# Patient Record
Sex: Male | Born: 1950 | Race: Black or African American | Hispanic: No | Smoking: Former smoker
Health system: Southern US, Community
[De-identification: ages and names within clinical notes are randomized; demographics above are authoritative.]

## PROBLEM LIST (undated history)

## (undated) DIAGNOSIS — E785 Hyperlipidemia, unspecified: Secondary | ICD-10-CM

## (undated) DIAGNOSIS — K5900 Constipation, unspecified: Secondary | ICD-10-CM

## (undated) DIAGNOSIS — I5042 Chronic combined systolic (congestive) and diastolic (congestive) heart failure: Secondary | ICD-10-CM

## (undated) DIAGNOSIS — J309 Allergic rhinitis, unspecified: Secondary | ICD-10-CM

## (undated) DIAGNOSIS — G8929 Other chronic pain: Secondary | ICD-10-CM

## (undated) DIAGNOSIS — F528 Other sexual dysfunction not due to a substance or known physiological condition: Secondary | ICD-10-CM

## (undated) DIAGNOSIS — I2510 Atherosclerotic heart disease of native coronary artery without angina pectoris: Secondary | ICD-10-CM

## (undated) DIAGNOSIS — J302 Other seasonal allergic rhinitis: Secondary | ICD-10-CM

## (undated) DIAGNOSIS — G5760 Lesion of plantar nerve, unspecified lower limb: Secondary | ICD-10-CM

## (undated) DIAGNOSIS — N189 Chronic kidney disease, unspecified: Secondary | ICD-10-CM

## (undated) DIAGNOSIS — E118 Type 2 diabetes mellitus with unspecified complications: Secondary | ICD-10-CM

## (undated) DIAGNOSIS — K219 Gastro-esophageal reflux disease without esophagitis: Secondary | ICD-10-CM

## (undated) DIAGNOSIS — G629 Polyneuropathy, unspecified: Secondary | ICD-10-CM

## (undated) DIAGNOSIS — I639 Cerebral infarction, unspecified: Secondary | ICD-10-CM

## (undated) DIAGNOSIS — M25519 Pain in unspecified shoulder: Secondary | ICD-10-CM

## (undated) DIAGNOSIS — I447 Left bundle-branch block, unspecified: Secondary | ICD-10-CM

## (undated) DIAGNOSIS — M549 Dorsalgia, unspecified: Secondary | ICD-10-CM

## (undated) DIAGNOSIS — E1165 Type 2 diabetes mellitus with hyperglycemia: Secondary | ICD-10-CM

## (undated) DIAGNOSIS — I10 Essential (primary) hypertension: Secondary | ICD-10-CM

## (undated) DIAGNOSIS — IMO0002 Reserved for concepts with insufficient information to code with codable children: Secondary | ICD-10-CM

## (undated) DIAGNOSIS — G459 Transient cerebral ischemic attack, unspecified: Secondary | ICD-10-CM

## (undated) HISTORY — DX: Allergic rhinitis, unspecified: J30.9

## (undated) HISTORY — DX: Lesion of plantar nerve, unspecified lower limb: G57.60

## (undated) HISTORY — DX: Chronic combined systolic (congestive) and diastolic (congestive) heart failure: I50.42

## (undated) HISTORY — DX: Chronic kidney disease, unspecified: N18.9

## (undated) HISTORY — PX: OTHER SURGICAL HISTORY: SHX169

## (undated) HISTORY — DX: Essential (primary) hypertension: I10

## (undated) HISTORY — DX: Reserved for concepts with insufficient information to code with codable children: IMO0002

## (undated) HISTORY — DX: Other sexual dysfunction not due to a substance or known physiological condition: F52.8

## (undated) HISTORY — DX: Pain in unspecified shoulder: M25.519

## (undated) HISTORY — DX: Atherosclerotic heart disease of native coronary artery without angina pectoris: I25.10

## (undated) HISTORY — DX: Cerebral infarction, unspecified: I63.9

## (undated) HISTORY — DX: Gastro-esophageal reflux disease without esophagitis: K21.9

## (undated) HISTORY — PX: DENTAL SURGERY: SHX609

## (undated) HISTORY — DX: Type 2 diabetes mellitus with unspecified complications: E11.8

## (undated) HISTORY — DX: Type 2 diabetes mellitus with hyperglycemia: E11.65

## (undated) HISTORY — DX: Hyperlipidemia, unspecified: E78.5

## (undated) HISTORY — PX: ANGIOPLASTY: SHX39

## (undated) HISTORY — PX: COLONOSCOPY: SHX174

## (undated) HISTORY — DX: Left bundle-branch block, unspecified: I44.7

---

## 2002-06-04 ENCOUNTER — Encounter: Payer: Self-pay | Admitting: *Deleted

## 2002-06-04 ENCOUNTER — Emergency Department (HOSPITAL_COMMUNITY): Admission: EM | Admit: 2002-06-04 | Discharge: 2002-06-04 | Payer: Self-pay | Admitting: Emergency Medicine

## 2003-11-23 HISTORY — PX: CORONARY ANGIOPLASTY: SHX604

## 2004-02-12 ENCOUNTER — Inpatient Hospital Stay (HOSPITAL_COMMUNITY): Admission: EM | Admit: 2004-02-12 | Discharge: 2004-02-14 | Payer: Self-pay | Admitting: Emergency Medicine

## 2004-09-10 ENCOUNTER — Ambulatory Visit: Payer: Self-pay | Admitting: Internal Medicine

## 2004-09-10 ENCOUNTER — Encounter: Payer: Self-pay | Admitting: Internal Medicine

## 2005-11-22 LAB — HM COLONOSCOPY

## 2006-09-06 ENCOUNTER — Ambulatory Visit: Payer: Self-pay | Admitting: Cardiology

## 2007-09-12 ENCOUNTER — Ambulatory Visit: Payer: Self-pay | Admitting: Internal Medicine

## 2007-09-21 ENCOUNTER — Ambulatory Visit: Payer: Self-pay | Admitting: Internal Medicine

## 2007-09-21 LAB — CONVERTED CEMR LAB
BUN: 8 mg/dL (ref 6–23)
CO2: 31 meq/L (ref 19–32)
Calcium: 9.2 mg/dL (ref 8.4–10.5)
Chloride: 105 meq/L (ref 96–112)
Cholesterol: 164 mg/dL (ref 0–200)
Creatinine, Ser: 0.9 mg/dL (ref 0.4–1.5)
GFR calc Af Amer: 112 mL/min
GFR calc non Af Amer: 92 mL/min
Glucose, Bld: 157 mg/dL — ABNORMAL HIGH (ref 70–99)
HDL: 40.8 mg/dL (ref >39.0)
Hgb A1c MFr Bld: 7.7 % — ABNORMAL HIGH (ref 4.6–6.0)
LDL Cholesterol: 87 mg/dL (ref 0–99)
Potassium: 4.1 meq/L (ref 3.5–5.1)
Pro B Natriuretic peptide (BNP): 17 pg/mL (ref 0.0–100.0)
Sodium: 142 meq/L (ref 135–145)
Total CHOL/HDL Ratio: 4
Triglycerides: 179 mg/dL — ABNORMAL HIGH (ref 0–149)
VLDL: 36 mg/dL (ref 0–40)

## 2008-11-08 ENCOUNTER — Inpatient Hospital Stay (HOSPITAL_COMMUNITY): Admission: EM | Admit: 2008-11-08 | Discharge: 2008-11-09 | Payer: Self-pay | Admitting: Emergency Medicine

## 2008-11-08 ENCOUNTER — Encounter (INDEPENDENT_AMBULATORY_CARE_PROVIDER_SITE_OTHER): Payer: Self-pay | Admitting: Internal Medicine

## 2008-12-26 ENCOUNTER — Encounter: Payer: Self-pay | Admitting: Internal Medicine

## 2008-12-26 LAB — CONVERTED CEMR LAB
ALT: 22 units/L
AST: 24 units/L
Albumin: 4.1 g/dL
Alkaline Phosphatase: 75 units/L
BUN: 13 mg/dL
CO2: 31 meq/L
Calcium: 9.8 mg/dL
Chloride: 99 meq/L
Cholesterol: 203 mg/dL
Creatinine, Ser: 1.1 mg/dL
Glucose, Bld: 163 mg/dL
HDL: 62 mg/dL
Hemoglobin: 17.1 g/dL
Hgb A1c MFr Bld: 9.4 %
LDL Cholesterol: 104 mg/dL
PSA: 0.13 ng/mL
Potassium: 4.3 meq/L
Sodium: 139 meq/L
Total Bilirubin: 1.5 mg/dL
Total Protein: 7.6 g/dL
Triglyceride fasting, serum: 108 mg/dL

## 2009-02-03 ENCOUNTER — Encounter: Payer: Self-pay | Admitting: Internal Medicine

## 2009-02-03 LAB — CONVERTED CEMR LAB
ALT: 22 units/L
AST: 21 units/L
Albumin: 4.2 g/dL
Alkaline Phosphatase: 68 units/L
BUN: 10 mg/dL
CO2: 31 meq/L
Calcium: 9.7 mg/dL
Chloride: 102 meq/L
Creatinine, Ser: 1.1 mg/dL
Glucose, Bld: 122 mg/dL
Potassium: 4.5 meq/L
Sodium: 138 meq/L
Total Bilirubin: 1.4 mg/dL
Total CK: 99 units/L
Total Protein: 7.6 g/dL

## 2009-03-07 DIAGNOSIS — E785 Hyperlipidemia, unspecified: Secondary | ICD-10-CM | POA: Insufficient documentation

## 2009-03-07 DIAGNOSIS — E1169 Type 2 diabetes mellitus with other specified complication: Secondary | ICD-10-CM | POA: Insufficient documentation

## 2009-03-12 ENCOUNTER — Ambulatory Visit: Payer: Self-pay | Admitting: Internal Medicine

## 2009-03-12 ENCOUNTER — Encounter: Payer: Self-pay | Admitting: Internal Medicine

## 2009-03-12 DIAGNOSIS — I10 Essential (primary) hypertension: Secondary | ICD-10-CM | POA: Insufficient documentation

## 2009-03-12 DIAGNOSIS — I152 Hypertension secondary to endocrine disorders: Secondary | ICD-10-CM | POA: Insufficient documentation

## 2009-03-12 DIAGNOSIS — F528 Other sexual dysfunction not due to a substance or known physiological condition: Secondary | ICD-10-CM | POA: Insufficient documentation

## 2009-03-12 DIAGNOSIS — I2510 Atherosclerotic heart disease of native coronary artery without angina pectoris: Secondary | ICD-10-CM | POA: Insufficient documentation

## 2009-03-12 DIAGNOSIS — Z9861 Coronary angioplasty status: Secondary | ICD-10-CM

## 2009-03-17 ENCOUNTER — Telehealth (INDEPENDENT_AMBULATORY_CARE_PROVIDER_SITE_OTHER): Payer: Self-pay | Admitting: Radiology

## 2009-03-26 ENCOUNTER — Encounter: Payer: Self-pay | Admitting: Internal Medicine

## 2009-03-31 ENCOUNTER — Telehealth (INDEPENDENT_AMBULATORY_CARE_PROVIDER_SITE_OTHER): Payer: Self-pay | Admitting: Radiology

## 2009-04-01 ENCOUNTER — Ambulatory Visit: Payer: Self-pay

## 2009-04-01 ENCOUNTER — Encounter: Payer: Self-pay | Admitting: Internal Medicine

## 2009-04-20 ENCOUNTER — Encounter: Payer: Self-pay | Admitting: Internal Medicine

## 2009-04-20 LAB — CONVERTED CEMR LAB
ALT: 20 units/L
AST: 22 units/L
Alkaline Phosphatase: 73 units/L
BUN: 15 mg/dL
CO2: 31 meq/L
Calcium: 9.6 mg/dL
Chloride: 99 meq/L
Cholesterol: 172 mg/dL
Creatinine, Ser: 1.3 mg/dL
Glucose, Bld: 202 mg/dL
HDL: 54 mg/dL
Hgb A1c MFr Bld: 8.3 %
LDL Cholesterol: 67 mg/dL
Potassium: 4.1 meq/L
Sodium: 137 meq/L
Triglyceride fasting, serum: 200 mg/dL

## 2010-02-24 ENCOUNTER — Ambulatory Visit: Payer: Self-pay | Admitting: Internal Medicine

## 2010-02-25 ENCOUNTER — Ambulatory Visit: Payer: Self-pay | Admitting: Internal Medicine

## 2010-02-26 DIAGNOSIS — IMO0002 Reserved for concepts with insufficient information to code with codable children: Secondary | ICD-10-CM | POA: Insufficient documentation

## 2010-02-26 DIAGNOSIS — E118 Type 2 diabetes mellitus with unspecified complications: Secondary | ICD-10-CM

## 2010-02-26 DIAGNOSIS — E1165 Type 2 diabetes mellitus with hyperglycemia: Secondary | ICD-10-CM | POA: Insufficient documentation

## 2010-03-02 LAB — CONVERTED CEMR LAB
ALT: 29 units/L (ref 0–53)
AST: 23 units/L (ref 0–37)
Albumin: 4.1 g/dL (ref 3.5–5.2)
Alkaline Phosphatase: 85 units/L (ref 39–117)
BUN: 12 mg/dL (ref 6–23)
Basophils Absolute: 0 10*3/uL (ref 0.0–0.1)
Basophils Relative: 0.4 % (ref 0.0–3.0)
Bilirubin, Direct: 0.1 mg/dL (ref 0.0–0.3)
CO2: 32 meq/L (ref 19–32)
Calcium: 9.3 mg/dL (ref 8.4–10.5)
Chloride: 98 meq/L (ref 96–112)
Cholesterol: 224 mg/dL — ABNORMAL HIGH (ref 0–200)
Creatinine, Ser: 1.2 mg/dL (ref 0.4–1.5)
Direct LDL: 112.2 mg/dL
Eosinophils Absolute: 0.2 10*3/uL (ref 0.0–0.7)
Eosinophils Relative: 2.9 % (ref 0.0–5.0)
GFR calc non Af Amer: 79.35 mL/min (ref >60)
Glucose, Bld: 365 mg/dL — ABNORMAL HIGH (ref 70–99)
HCT: 45.6 % (ref 39.0–52.0)
HDL: 63.7 mg/dL (ref >39.00)
Hemoglobin: 15.6 g/dL (ref 13.0–17.0)
Hgb A1c MFr Bld: 12.6 % — ABNORMAL HIGH (ref 4.6–6.5)
Lymphocytes Relative: 39.1 % (ref 12.0–46.0)
Lymphs Abs: 2.4 10*3/uL (ref 0.7–4.0)
MCHC: 34.2 g/dL (ref 30.0–36.0)
MCV: 90.7 fL (ref 78.0–100.0)
Monocytes Absolute: 0.6 10*3/uL (ref 0.1–1.0)
Monocytes Relative: 8.8 % (ref 3.0–12.0)
Neutro Abs: 3.1 10*3/uL (ref 1.4–7.7)
Neutrophils Relative %: 48.8 % (ref 43.0–77.0)
Platelets: 199 10*3/uL (ref 150.0–400.0)
Potassium: 3.8 meq/L (ref 3.5–5.1)
RBC: 5.03 M/uL (ref 4.22–5.81)
RDW: 13.1 % (ref 11.5–14.6)
Sodium: 138 meq/L (ref 135–145)
Total Bilirubin: 1.1 mg/dL (ref 0.3–1.2)
Total CHOL/HDL Ratio: 4
Total Protein: 8.3 g/dL (ref 6.0–8.3)
Triglycerides: 230 mg/dL — ABNORMAL HIGH (ref 0.0–149.0)
VLDL: 46 mg/dL — ABNORMAL HIGH (ref 0.0–40.0)
WBC: 6.3 10*3/uL (ref 4.5–10.5)

## 2010-04-13 ENCOUNTER — Ambulatory Visit: Payer: Self-pay | Admitting: Internal Medicine

## 2010-04-13 DIAGNOSIS — Z87891 Personal history of nicotine dependence: Secondary | ICD-10-CM | POA: Insufficient documentation

## 2010-04-13 DIAGNOSIS — E1159 Type 2 diabetes mellitus with other circulatory complications: Secondary | ICD-10-CM | POA: Insufficient documentation

## 2010-04-13 DIAGNOSIS — R519 Headache, unspecified: Secondary | ICD-10-CM | POA: Insufficient documentation

## 2010-04-13 DIAGNOSIS — E119 Type 2 diabetes mellitus without complications: Secondary | ICD-10-CM | POA: Insufficient documentation

## 2010-04-13 DIAGNOSIS — J309 Allergic rhinitis, unspecified: Secondary | ICD-10-CM | POA: Insufficient documentation

## 2010-04-13 DIAGNOSIS — Z794 Long term (current) use of insulin: Secondary | ICD-10-CM

## 2010-04-13 DIAGNOSIS — E1169 Type 2 diabetes mellitus with other specified complication: Secondary | ICD-10-CM | POA: Insufficient documentation

## 2010-04-13 DIAGNOSIS — R51 Headache: Secondary | ICD-10-CM | POA: Insufficient documentation

## 2010-04-13 DIAGNOSIS — K219 Gastro-esophageal reflux disease without esophagitis: Secondary | ICD-10-CM | POA: Insufficient documentation

## 2010-04-13 LAB — CONVERTED CEMR LAB: PSA: 0.16 ng/mL (ref 0.10–4.00)

## 2010-04-13 LAB — HM DIABETES FOOT EXAM

## 2010-04-16 ENCOUNTER — Telehealth (INDEPENDENT_AMBULATORY_CARE_PROVIDER_SITE_OTHER): Payer: Self-pay | Admitting: *Deleted

## 2010-04-21 ENCOUNTER — Encounter: Payer: Self-pay | Admitting: Internal Medicine

## 2010-04-29 ENCOUNTER — Encounter: Payer: Self-pay | Admitting: Internal Medicine

## 2010-05-05 ENCOUNTER — Encounter: Payer: Self-pay | Admitting: Internal Medicine

## 2010-05-15 ENCOUNTER — Ambulatory Visit: Payer: Self-pay | Admitting: Endocrinology

## 2010-05-15 LAB — CONVERTED CEMR LAB: Hgb A1c MFr Bld: 9.8 % — ABNORMAL HIGH (ref 4.6–6.5)

## 2010-05-29 ENCOUNTER — Ambulatory Visit: Payer: Self-pay | Admitting: Internal Medicine

## 2010-05-29 DIAGNOSIS — G5760 Lesion of plantar nerve, unspecified lower limb: Secondary | ICD-10-CM | POA: Insufficient documentation

## 2010-05-29 DIAGNOSIS — H6690 Otitis media, unspecified, unspecified ear: Secondary | ICD-10-CM | POA: Insufficient documentation

## 2010-05-29 LAB — CONVERTED CEMR LAB: Blood Glucose, Fingerstick: 105

## 2010-06-02 ENCOUNTER — Encounter: Payer: Self-pay | Admitting: Internal Medicine

## 2010-10-26 ENCOUNTER — Ambulatory Visit: Payer: Self-pay | Admitting: Internal Medicine

## 2010-10-28 LAB — CONVERTED CEMR LAB: Hgb A1c MFr Bld: 13.6 % — ABNORMAL HIGH (ref 4.6–6.5)

## 2010-10-29 ENCOUNTER — Ambulatory Visit: Payer: Self-pay | Admitting: Endocrinology

## 2010-11-05 ENCOUNTER — Encounter: Payer: Self-pay | Admitting: Endocrinology

## 2010-12-05 ENCOUNTER — Encounter: Payer: Self-pay | Admitting: Endocrinology

## 2010-12-22 NOTE — Assessment & Plan Note (Signed)
Summary: cough/coughing up phlegm/cd   Vital Signs:  Patient profile:   60 year old male Height:      71 inches (180.34 cm) Weight:      204.6 pounds (93.00 kg) O2 Sat:      97 % on Room air Temp:     98.8 degrees F (37.11 degrees C) oral Pulse rate:   90 / minute BP sitting:   142 / 98  (left arm) Cuff size:   large  Vitals Entered By: Tomma Lightning RMA (October 26, 2010 1:22 PM)  O2 Flow:  Room air CC: Cough & (L) side pain ? due to cough, URI symptoms Is Patient Diabetic? Yes Did you bring your meter with you today? No Pain Assessment Patient in pain? yes     Location: (L) side Type: aching   Primary Care Provider:  Rowe Clack MD  CC:  Cough & (L) side pain ? due to cough and URI symptoms.  History of Present Illness:  URI Symptoms      This is a 60 year old man who presents with URI symptoms.  The symptoms began 2 weeks ago.  The severity is described as moderate.  not using any OTC meds for same.  The patient reports nasal congestion, productive cough, and sick contacts, but denies clear nasal discharge, purulent nasal discharge, and sore throat.  Associated symptoms include low-grade fever (<100.5 degrees).  The patient denies dyspnea, wheezing, vomiting, and diarrhea.  The patient also reports itchy throat, headache, and muscle aches.  The patient denies sneezing and severe fatigue.  The patient denies the following risk factors for Strep sinusitis: tender adenopathy  reviewed chronic med issues: 1) DM2, improving control -reports checking sugars at home 3-4x/d - runs 100s-120s now - no hypoglycemic signs or symptoms - reports compliance with ongoing medical treatment and no changes in medication dose or frequency. denies adverse side effects related to current therapy.  never on insulin - no hx [facility] hosp reproted  2) HTN -reports compliance with ongoing medical treatment and no changes in medication dose or frequency. denies adverse side effects related to  current therapy.  no vision changes or HA, no unilateral weakness -  3) CAD - follws with [facility] cards for same - reports compliance with ongoing medical treatment and no changes in medication dose or frequency. denies adverse side effects related to current therapy.  no CP or DOE or near syncope- no LE swelling  4) right shoulder pain - involves neck on right side too - chronic symptoms since on-job injury in 1999 - no tingling - no weakness, no HA or balance problems - symptoms not improved with tylenol or heat - some better with muscle relaxants - has seen ortho for same - rec surg but wishes to delay for now  Clinical Review Panels:  Lipid Management   Cholesterol:  224 (02/25/2010)   LDL (bad choesterol):  67 (04/20/2009)   HDL (good cholesterol):  63.70 (02/25/2010)   Triglycerides:  200 (04/20/2009)  Diabetes Management   HgBA1C:  9.8 (05/15/2010)   Creatinine:  1.2 (02/25/2010)   Last Foot Exam:  yes (04/13/2010)  CBC   WBC:  6.3 (02/25/2010)   RBC:  5.03 (02/25/2010)   Hgb:  15.6 (02/25/2010)   Hct:  45.6 (02/25/2010)   Platelets:  199.0 (02/25/2010)   MCV  90.7 (02/25/2010)   MCHC  34.2 (02/25/2010)   RDW  13.1 (02/25/2010)   PMN:  48.8 (02/25/2010)   Lymphs:  39.1 (02/25/2010)   Monos:  8.8 (02/25/2010)   Eosinophils:  2.9 (02/25/2010)   Basophil:  0.4 (02/25/2010)  Complete Metabolic Panel   Glucose:  365 (02/25/2010)   Sodium:  138 (02/25/2010)   Potassium:  3.8 (02/25/2010)   Chloride:  98 (02/25/2010)   CO2:  32 (02/25/2010)   BUN:  12 (02/25/2010)   Creatinine:  1.2 (02/25/2010)   Albumin:  4.1 (02/25/2010)   Total Protein:  8.3 (02/25/2010)   Calcium:  9.3 (02/25/2010)   Total Bili:  1.1 (02/25/2010)   Alk Phos:  85 (02/25/2010)   SGPT (ALT):  29 (02/25/2010)   SGOT (AST):  23 (02/25/2010)   Current Medications (verified): 1)  Aspirin 325 Mg Tabs (Aspirin) .... Take 1 Tab By Mouth Every Day 2)  Carvedilol 6.25 Mg Tabs (Carvedilol) .... Take One  Tablet By Mouth Twice A Day 3)  Lisinopril-Hydrochlorothiazide 20-12.5 Mg Tabs (Lisinopril-Hydrochlorothiazide) .... Take 1 Tablet By Mouth Once A Day 4)  Pravastatin Sodium 20 Mg Tabs (Pravastatin Sodium) .... Take One Tablet By Mouth Daily At Bedtime 5)  Nitrostat 0.4 Mg Subl (Nitroglycerin) .Marland Kitchen.. 1 Tablet Under Tongue At Onset of Chest Pain; You May Repeat Every 5 Minutes For Up To 3 Doses. 6)  Soma 350 Mg Tabs (Carisoprodol) .... Take 1 Three Times A Day As Needed For Muscle Spasms 7)  Claritin 10 Mg Tabs (Loratadine) .... Take 1 By Mouth Once Daily As Needed 8)  Glipizide Xl 5 Mg Xr24h-Tab (Glipizide) .Marland Kitchen.. 1 By Mouth Two Times A Day 9)  Metformin Hcl 500 Mg Xr24h-Tab (Metformin Hcl) .... 2 Tabs Two Times A Day 10)  Januvia 100 Mg Tabs (Sitagliptin Phosphate) .Marland Kitchen.. 1 Tab Each Am 11)  Bromocriptine Mesylate 2.5 Mg Tabs (Bromocriptine Mesylate) .... 1/2 Tab At Bedtime  Allergies (verified): 1)  ! Penicillin 2)  ! * Shellfish  Past History:  Past Medical History: 1. CAD      a. s/p BMS to OM1 2001      b. repeat cath 2005: ISR of OM1 stent treated with DES 2. h/o LV dysfunction      a. previous EF 35-45%. EF on cath 1/05: 60%      b. ECHO 12/09: EF 60% 3. DM2, uncontrolled 4. HTN 5. Hyperlipidemia Allergic rhinitis GERD  erectile dysfunction  MD roster: cards - [facility] - murphywainer  Review of Systems  The patient denies weight loss, syncope, hemoptysis, and abdominal pain.    Physical Exam  General:  alert, well-developed, well-nourished, and cooperative to examination.   mildly ill Head:  Normocephalic and atraumatic without obvious abnormalities. No apparent alopecia or balding. Eyes:  vision grossly intact; pupils equal, round and reactive to light.  conjunctiva and lids normal.    Ears:  R TM hazy with erythema - +mod effusion Mouth:  teeth and gums in good repair; mucous membranes moist, without lesions or ulcers. oropharynx clear without  exudate, mod erythema. +PND Lungs:  normal respiratory effort, no intercostal retractions or use of accessory muscles; normal breath sounds bilaterally - no crackles and no wheezes.    Heart:  normal rate, regular rhythm, no murmur, and no rub. BLE without edema.    Impression & Recommendations:  Problem # 1:  ACUTE SINUSITIS, UNSPECIFIED (ICD-461.9)  His updated medication list for this problem includes:    Augmentin 875-125 Mg Tabs (Amoxicillin-pot clavulanate) .Marland Kitchen... 1 by mouth two times a day x 7 days    Tessalon Perles 100 Mg Caps (Benzonatate) .Marland KitchenMarland KitchenMarland KitchenMarland Kitchen  1 by mouth tids x 5 days, then as needed for cough symptoms  Instructed on treatment. Call if symptoms persist or worsen.   Orders: Prescription Created Electronically [phone])  Problem # 2:  DIABETES MELLITUS, TYPE II, UNCONTROLLED (ICD-250.02)  His updated medication list for this problem includes:    Aspirin 325 Mg Tabs (Aspirin) .Marland Kitchen... Take 1 tab by mouth every day    Lisinopril-hydrochlorothiazide 20-12.5 Mg Tabs (Lisinopril-hydrochlorothiazide) .Marland Kitchen... Take 1 tablet by mouth once a day    Glipizide Xl 5 Mg Xr24h-tab (Glipizide) .Marland Kitchen... 1 by mouth two times a day    Metformin Hcl 500 Mg Xr24h-tab (Metformin hcl) .Marland Kitchen... 2 tabs two times a day    Januvia 100 Mg Tabs (Sitagliptin phosphate) .Marland Kitchen... 1 tab each am  cont endo mgmt as ongoing  Orders: TLB-A1C / Hgb A1C (Glycohemoglobin) (83036-A1C)  Labs Reviewed: Creat: 1.2 (02/25/2010)    Reviewed HgBA1c results: 9.8 (05/15/2010)  12.6 (02/25/2010)  Complete Medication List: 1)  Aspirin 325 Mg Tabs (Aspirin) .... Take 1 tab by mouth every day 2)  Carvedilol 6.25 Mg Tabs (Carvedilol) .... Take one tablet by mouth twice a day 3)  Lisinopril-hydrochlorothiazide 20-12.5 Mg Tabs (Lisinopril-hydrochlorothiazide) .... Take 1 tablet by mouth once a day 4)  Pravastatin Sodium 20 Mg Tabs (Pravastatin sodium) .... Take one tablet by mouth daily at bedtime 5)  Nitrostat 0.4 Mg Subl  (Nitroglycerin) .Marland Kitchen.. 1 tablet under tongue at onset of chest pain; you may repeat every 5 minutes for up to 3 doses. 6)  Soma 350 Mg Tabs (Carisoprodol) .... Take 1 three times a day as needed for muscle spasms 7)  Claritin 10 Mg Tabs (Loratadine) .... Take 1 by mouth once daily as needed 8)  Glipizide Xl 5 Mg Xr24h-tab (Glipizide) .Marland Kitchen.. 1 by mouth two times a day 9)  Metformin Hcl 500 Mg Xr24h-tab (Metformin hcl) .... 2 tabs two times a day 10)  Januvia 100 Mg Tabs (Sitagliptin phosphate) .Marland Kitchen.. 1 tab each am 11)  Bromocriptine Mesylate 2.5 Mg Tabs (Bromocriptine mesylate) .... 1/2 tab at bedtime 12)  Augmentin 875-125 Mg Tabs (Amoxicillin-pot clavulanate) .Marland Kitchen.. 1 by mouth two times a day x 7 days 13)  Tessalon Perles 100 Mg Caps (Benzonatate) .Marland Kitchen.. 1 by mouth tids x 5 days, then as needed for cough symptoms  Patient Instructions: 1)  it was good to see you today. 2)  Augmentin and tessalon for sinus and cough symptoms - your prescriptions have been electronically submitted to your pharmacy. Please take as directed. Contact our office if you believe you're having problems with the medication(s). 3)  test(s) ordered today - your results will be posted on the phone tree for review in 48-72 hours from the time of test completion; call [phone] and enter your 9 digit MRN (listed above on this page, just below your name); if any changes need to be made or there are abnormal results, you will be contacted directly.  4)  refills on all medications today 5)  Please schedule a follow-up appointment in 3-6 months to follow diabetes, call sooner if problems.  Prescriptions: JANUVIA 100 MG TABS (SITAGLIPTIN PHOSPHATE) 1 tab each am  #90 x 3   Entered and Authorized by:   Rowe Clack MD   Signed by:   Rowe Clack MD on 10/26/2010   Method used:   Electronically to        [facility] (retail)       [address]  [place], [place]  [address]       Ph:  [phone]       Fax: [phone]   RxID:   [id number] METFORMIN HCL 500 MG XR24H-TAB (METFORMIN HCL) 2 tabs two times a day  #360 x 3   Entered and Authorized by:   Rowe Clack MD   Signed by:   Rowe Clack MD on 10/26/2010   Method used:   Electronically to        [facility] (retail)       [address]       [place], [place]  [address]       Ph: [phone]       Fax: [phone]   RxID:   [id number] GLIPIZIDE XL 5 MG XR24H-TAB (GLIPIZIDE) 1 by mouth two times a day  #180 x 3   Entered and Authorized by:   Rowe Clack MD   Signed by:   Rowe Clack MD on 10/26/2010   Method used:   Electronically to        [facility] (retail)       [address]       [place], [place]  [address]       Ph: [phone]       Fax: [phone]   RxID:   [id number] PRAVASTATIN SODIUM 20 MG TABS (PRAVASTATIN SODIUM) Take one tablet by mouth daily at bedtime  #90 x 3   Entered and Authorized by:   Rowe Clack MD   Signed by:   Rowe Clack MD on 10/26/2010   Method used:   Electronically to        [facility] (retail)       [address]       [place], [place]  [address]       Ph: [phone]       Fax: [phone]   RxID:   [id number] TESSALON PERLES 100 MG CAPS (BENZONATATE) 1 by mouth tids x 5 days, then as needed for cough symptoms  #30 x 0   Entered and Authorized by:   Rowe Clack MD   Signed by:   Rowe Clack MD on 10/26/2010   Method used:   Electronically to        [facility] (retail)       [address]       [place], [place]  [address]       Ph: [phone]       Fax: [phone]   RxID:   [id number] AUGMENTIN 875-125 MG TABS (AMOXICILLIN-POT CLAVULANATE) 1 by mouth two times a day x 7 days  #14 x 0   Entered and Authorized by:   Rowe Clack MD   Signed by:   Rowe Clack MD on 10/26/2010   Method used:   Electronically to        [facility] (retail)       [address]       [place],   [address]       Ph: [phone]       Fax: [phone]   RxID:  [id number]    Orders Added: 1)  TLB-A1C / Hgb A1C (Glycohemoglobin) [83036-A1C] 2)  Est. Patient Level IV [id number] 3)  Prescription Created Electronically [phone]

## 2010-12-22 NOTE — Assessment & Plan Note (Signed)
Summary: NEW / MEDICARE/ REFERRED BY DR BENSIMONE/[facility]   Vital Signs:  Patient profile:   60 year old male Height:      71 inches (180.34 cm) Weight:      201.8 pounds (91.73 kg) O2 Sat:      98 % on Room air Temp:     98.7 degrees F (37.06 degrees C) oral Pulse rate:   94 / minute BP sitting:   120 / 92  (left arm) Cuff size:   large  Vitals Entered By: Tomma Lightning (Apr 13, 2010 9:38 AM)  O2 Flow:  Room air CC: New patient Is Patient Diabetic? Yes Did you bring your meter with you today? No Pain Assessment Patient in pain? no       Vision Screening:Left eye with correction: 20 / 30 Right eye with correction: 20 / 30 Both eyes with correction: 20 / 30  Color vision testing: normal       Primary Care Provider:  Rowe Clack MD  CC:  New patient.  History of Present Illness: new pt to me and our dvision - here to est care - follows with our cards group - prev PCP in [place]  patient is here today for annual medicare wellness physical. Patient feels well today - wants to "be checked for cancer" -prostate and colon specifically due to [place] same- Diet: Heart Healthy or DM  Physical Activities: Sedentary Depression/mood screen: Negative Congnitive: orientation/language/memory/attention: no deficits Hearing: grossly normal Visual Acuity: Grossly normal, wears reading glasses, sees optho -last 3 years ADL's: Capable  Fall Risk: None Home Safety: Good End-of-Life Planning: Advance directive - Full code  also wants to review other chronic medical conditions as this is his 1st OV here - 1) uncontrolled DM2 -reports checking sugars at home 3-4x/d - runs 180-220 - no hypoglycemic signs or symptoms - reports compliance with ongoing medical treatment and no changes in medication dose or frequency. denies adverse side effects related to current therapy.  never on insulin - no hx [facility] hosp reproted  2) HTN -reports compliance with ongoing medical treatment and no changes in  medication dose or frequency. denies adverse side effects related to current therapy.  no vision changes or HA, no unilateral weakness -  3) CAD - follws with [facility] cards for same - reports compliance with ongoing medical treatment and no changes in medication dose or frequency. denies adverse side effects related to current therapy.  no CP or DOE or near syncope- no LE swelling  4) right shoulder pain - involves neck on right side too - chronic symptoms since on-job injury in 1999 - no tingling - no weakness, no HA or balance problems - symptoms not improved with tylenol or heat - some better with muscle relaxants  Preventive Screening-Counseling & Management  Alcohol-Tobacco     Alcohol drinks/day: 0     Alcohol Counseling: not indicated; patient does not drink     Smoking Status: quit     Year Quit: 1984     Tobacco Counseling: to remain off tobacco products  Caffeine-Diet-Exercise     Caffeine Counseling: not indicated; caffeine use is not excessive or problematic     Diet Counseling: to improve diet; diet is suboptimal     Does Patient Exercise: no     Exercise Counseling: to improve exercise regimen     Depression Counseling: not indicated; screening negative for depression  Safety-Violence-Falls     Seat Belt Use: yes     Seat  Belt Counseling: not indicated; patient wears seat belts     Helmet Use: n/a     Helmet Counseling: not applicable     Firearms in the Home: no firearms in the home     Firearm Counseling: not applicable     Smoke Detectors: yes     Smoke Detector Counseling: n/a     Violence in the Home: no risk noted     Fall Risk Counseling: not indicated; no significant falls noted  Clinical Review Panels:  Prevention   Last Colonoscopy:  Pt states was done @ hospital in [place] everything was normal (11/22/2005)  Immunizations   Last Tetanus Booster:  Historical (11/22/2004)  Lipid Management   Cholesterol:  224 (02/25/2010)   LDL (bad choesterol):  87  (09/21/2007)   HDL (good cholesterol):  63.70 (02/25/2010)  Diabetes Management   HgBA1C:  12.6 (02/25/2010)   Creatinine:  1.2 (02/25/2010)   Last Foot Exam:  yes (04/13/2010)  CBC   WBC:  6.3 (02/25/2010)   RBC:  5.03 (02/25/2010)   Hgb:  15.6 (02/25/2010)   Hct:  45.6 (02/25/2010)   Platelets:  199.0 (02/25/2010)   MCV  90.7 (02/25/2010)   MCHC  34.2 (02/25/2010)   RDW  13.1 (02/25/2010)   PMN:  48.8 (02/25/2010)   Lymphs:  39.1 (02/25/2010)   Monos:  8.8 (02/25/2010)   Eosinophils:  2.9 (02/25/2010)   Basophil:  0.4 (02/25/2010)  Complete Metabolic Panel   Glucose:  365 (02/25/2010)   Sodium:  138 (02/25/2010)   Potassium:  3.8 (02/25/2010)   Chloride:  98 (02/25/2010)   CO2:  32 (02/25/2010)   BUN:  12 (02/25/2010)   Creatinine:  1.2 (02/25/2010)   Albumin:  4.1 (02/25/2010)   Total Protein:  8.3 (02/25/2010)   Calcium:  9.3 (02/25/2010)   Total Bili:  1.1 (02/25/2010)   Alk Phos:  85 (02/25/2010)   SGPT (ALT):  29 (02/25/2010)   SGOT (AST):  23 (02/25/2010)   Current Medications (verified): 1)  Aspirin 325 Mg Tabs (Aspirin) .... Take 1 Tab By Mouth Every Day 2)  Carvedilol 6.25 Mg Tabs (Carvedilol) .... Take One Tablet By Mouth Twice A Day 3)  Metformin Hcl 500 Mg Tabs (Metformin Hcl) .... Take 1 Tablet By Mouth Two Times A Day 4)  Lisinopril-Hydrochlorothiazide 20-12.5 Mg Tabs (Lisinopril-Hydrochlorothiazide) .... Take 1 Tablet By Mouth Once A Day 5)  Pravastatin Sodium 20 Mg Tabs (Pravastatin Sodium) .... Take One Tablet By Mouth Daily At Bedtime 6)  Nitrostat 0.4 Mg Subl (Nitroglycerin) .Marland Kitchen.. 1 Tablet Under Tongue At Onset of Chest Pain; You May Repeat Every 5 Minutes For Up To 3 Doses. 7)  Soma 350 Mg Tabs (Carisoprodol) .... Take 1 As Needed 8)  Claritin 10 Mg Tabs (Loratadine) .... Take 1 By Mouth Once Daily As Needed  Allergies (verified): 1)  ! Penicillin 2)  ! * Shellfish  Past History:  Past Medical History: 1. CAD      a. s/p BMS to OM1 2001       b. repeat cath 2005: ISR of OM1 stent treated with DES 2. h/o LV dysfunction      a. previous EF 35-45%. EF on cath 1/05: 60%      b. ECHO 12/09: EF 60% 3. DM2, uncontrolled 4. HTN 5. Hyperlipidemia Allergic rhinitis GERD erectile dysfunction  MD [facility]: cards - [facility] - [facility]  Past Surgical History: Stent- 2001 Angioplasy Left knee surgery   Family History: Is insignificant for coronary artery  disease in immediate  relatives. Family History of Colon CA 1st degree relative <60 Family History of Prostate CA 1st degree relative <50  Social History: The patient lives in [place] with his wife.   He is disabled secondary to back pain.   Prev worked as Curator in [place] He quit smoking cigarettes in 1984 denies any alcohol use. Smoking Status:  quit Does Patient Exercise:  no Seat Belt Use:  yes  Review of Systems       see HPI above. I have reviewed all other systems and they were negative.   Physical Exam  General:  alert, well-developed, well-nourished, and cooperative to examination.    Eyes:  vision grossly intact; pupils equal, round and reactive to light.  conjunctiva and lids normal.    Ears:  normal pinnae bilaterally, without erythema, swelling, or tenderness to palpation. TMs clear, without effusion, or cerumen impaction. Hearing grossly normal bilaterally  Mouth:  teeth and gums in good repair; mucous membranes moist, without lesions or ulcers. oropharynx clear without exudate, no erythema.  Neck:  supple, full ROM, no masses, no thyromegaly; no thyroid nodules or tenderness. no JVD or carotid bruits.   Lungs:  normal respiratory effort, no intercostal retractions or use of accessory muscles; normal breath sounds bilaterally - no crackles and no wheezes.    Heart:  normal rate, regular rhythm, no murmur, and no rub. BLE without edema. normal DP pulses and normal cap refill in all 4 extremities    Abdomen:  soft, non-tender, normal bowel  sounds, no distention; no masses and no appreciable hepatomegaly or splenomegaly.   Msk:  right shoulder with decreased active range of motion on forward flexion, abduction, and internal rotation. Positive impingement signs. Decreased strength with stressing of rotator cuff.  referred pain into distal deltoid and right neck. nontender over a.c. joint and subacromial.  Neurologic:  alert & oriented X3 and cranial nerves II-XII symetrically intact.  strength normal in all extremities, sensation intact to light touch, and gait normal. speech fluent without dysarthria or aphasia; follows commands with good comprehension.  Skin:  no rashes, vesicles, ulcers, or erythema. No nodules or irregularity to palpation.  Psych:  Oriented X3, memory intact for recent and remote, normally interactive, good eye contact, not anxious appearing, not depressed appearing, and not agitated.     Diabetes Management Exam:    Foot Exam (with socks and/or shoes not present):       Sensory-Pinprick/Light touch:          Left medial foot (L-4): normal          Left dorsal foot (L-5): normal          Left lateral foot (S-1): normal          Right medial foot (L-4): normal          Right dorsal foot (L-5): normal          Right lateral foot (S-1): normal       Sensory-Monofilament:          Left foot: normal          Right foot: normal       Inspection:          Left foot: normal          Right foot: normal       Nails:          Left foot: normal          Right foot: normal  Impression & Recommendations:  Problem # 1:  PREVENTIVE HEALTH CARE (ICD-V70.0) Patient is here for A[facility] medicare evaluation. age-appropriate routine health concerns for screening and prevention. These are reviewed and up-to-date. Immunizations are up-to-date or declined. Labs and ECG reviewed.  Risk factors for depression per USPSTF are reviewed and negative. Patient hearing and vision function is screened today; ADLs are reviewed and  addressed as needed; functional ability and  level of safety have been reviewed and are appropriate. Education, counseling, and referrals are performed based on age appropriate health issues. Patient has information regarding separate preventitive health services covered by medicare  Cognitive Assessment  performed and appropriate  Orders: TLB-PSA (Prostate Specific Antigen) (84153-PSA) First annual wellness visit with prevention plan  [id number])  Problem # 2:  DIABETES MELLITUS, TYPE II, UNCONTROLLED (ICD-250.02) inc med tx - add OHA refer endo and optho - we have discussed the importance of diet and exercise  His updated medication list for this problem includes:    Aspirin 325 Mg Tabs (Aspirin) .Marland Kitchen... Take 1 tab by mouth every day    Metformin Hcl 500 Mg Tabs (Metformin hcl) .Marland Kitchen... Take 1 tablet by mouth two times a day    Lisinopril-hydrochlorothiazide 20-12.5 Mg Tabs (Lisinopril-hydrochlorothiazide) .Marland Kitchen... Take 1 tablet by mouth once a day    Glipizide Xl 5 Mg Xr24h-tab (Glipizide) .Marland Kitchen... 1 by mouth two times a day  Orders: Ophthalmology Referral (Ophthalmology) Endocrinology Referral (Endocrine) Prescription Created Electronically [phone])  Labs Reviewed: Creat: 1.2 (02/25/2010)    Reviewed HgBA1c results: 12.6 (02/25/2010)  7.7 (09/21/2007)  Problem # 3:  SHOULDER PAIN, RIGHT (ICD-719.41) chronic - ?RTC with scarring - check xrays now(11/08/2008 right shoulder and c-spine reports reviewed) refer ortho - -?shot no change in med mgmt at this time  His updated medication list for this problem includes:    Aspirin 325 Mg Tabs (Aspirin) .Marland Kitchen... Take 1 tab by mouth every day    Soma 350 Mg Tabs (Carisoprodol) .Marland Kitchen... Take 1 as needed  Orders: Orthopedic Referral (Ortho) T-Shoulder Right (73030TC) T-Cervicle Spine 2-3 Views [id number])  Problem # 4:  CAD, NATIVE VESSEL (ICD-414.01)  His updated medication list for this problem includes:    Aspirin 325 Mg Tabs (Aspirin) .Marland Kitchen... Take 1  tab by mouth every day    Carvedilol 6.25 Mg Tabs (Carvedilol) .Marland Kitchen... Take one tablet by mouth twice a day    Lisinopril-hydrochlorothiazide 20-12.5 Mg Tabs (Lisinopril-hydrochlorothiazide) .Marland Kitchen... Take 1 tablet by mouth once a day    Nitrostat 0.4 Mg Subl (Nitroglycerin) .Marland Kitchen... 1 tablet under tongue at onset of chest pain; you may repeat every 5 minutes for up to 3 doses.  per card 02/2010-: Recent Myvoiew reassuring. Main focus now is to get risk factors under control by restarting medicines. Will con't ASA. Resume Prava 20, Lisinopril/HCTZ 30/12.5. Switch lopressor to coreg 6.25 two times a day. Titrate as needed. Check lipids, cmet, hgba1cand refer to PCP.  Problem # 5:  HYPERTENSION, BENIGN (ICD-401.1)  His updated medication list for this problem includes:    Carvedilol 6.25 Mg Tabs (Carvedilol) .Marland Kitchen... Take one tablet by mouth twice a day    Lisinopril-hydrochlorothiazide 20-12.5 Mg Tabs (Lisinopril-hydrochlorothiazide) .Marland Kitchen... Take 1 tablet by mouth once a day  BP today: 120/92 Prior BP: 180/108 (02/24/2010)  Labs Reviewed: K+: 3.8 (02/25/2010) Creat: : 1.2 (02/25/2010)   Chol: 224 (02/25/2010)   HDL: 63.70 (02/25/2010)   LDL: 87 (09/21/2007)   TG: 230.0 (02/25/2010)  Problem # 6:  HYPERLIPIDEMIA-MIXED (ICD-272.4)  statin resumed 02/2010 -  recheck 6-12 weeks His updated medication list for this problem includes:    Pravastatin Sodium 20 Mg Tabs (Pravastatin sodium) .Marland Kitchen... Take one tablet by mouth daily at bedtime  Labs Reviewed: SGOT: 23 (02/25/2010)   SGPT: 29 (02/25/2010)   HDL:63.70 (02/25/2010), 40.8 (09/21/2007)  LDL:87 (09/21/2007)  Chol:224 (02/25/2010), 164 (09/21/2007)  Trig:230.0 (02/25/2010), 179 (09/21/2007)  Problem # 7:  GERD (ICD-530.81) conside need for PPI if symptoms not improved after ortho eval/tx  Complete Medication List: 1)  Aspirin 325 Mg Tabs (Aspirin) .... Take 1 tab by mouth every day 2)  Carvedilol 6.25 Mg Tabs (Carvedilol) .... Take one tablet by mouth  twice a day 3)  Metformin Hcl 500 Mg Tabs (Metformin hcl) .... Take 1 tablet by mouth two times a day 4)  Lisinopril-hydrochlorothiazide 20-12.5 Mg Tabs (Lisinopril-hydrochlorothiazide) .... Take 1 tablet by mouth once a day 5)  Pravastatin Sodium 20 Mg Tabs (Pravastatin sodium) .... Take one tablet by mouth daily at bedtime 6)  Nitrostat 0.4 Mg Subl (Nitroglycerin) .Marland Kitchen.. 1 tablet under tongue at onset of chest pain; you may repeat every 5 minutes for up to 3 doses. 7)  Soma 350 Mg Tabs (Carisoprodol) .... Take 1 as needed 8)  Claritin 10 Mg Tabs (Loratadine) .... Take 1 by mouth once daily as needed 9)  Glipizide Xl 5 Mg Xr24h-tab (Glipizide) .Marland Kitchen.. 1 by mouth two times a day  Patient Instructions: 1)  it was good to see you today. 2)  labs reviewed today - need better control of your diabetes! 3)  test(s) ordered today - your results will be posted on the phone tree for review in 48-72 hours from the time of test completion; call [phone] and enter your 9 digit MRN (listed above on this page, just below your name); if any changes need to be made or there are abnormal results, you will be contacted directly.  4)  we'll make referral to endocrinologist (diabetes doc), opthomologist (eye doc) and orthopedic specialist (for your right shoulder and neck) . Our office will contact you regarding these appointments once made.  5)  add glipizide to your current medications for diabetes - no other changes - your prescription has been electronically submitted to your pharmacy. Please take as directed. Contact our office if you believe you're having problems with the medication(s).  6)  will send for copy of records from [facility] and from [facility] (dr. Tomi Bamberger) to incoprorate into our records 7)  Check your blood sugars regularly. If your readings are usually above 200 or below 70 you should contact our office. 8)  It is important that your Diabetic A1c level is checked every 3 months. 9)  See your eye  doctor yearly to check for diabetic eye damage. 10)  Please schedule a follow-up appointment in 3-4 months, sooner if problems.  Prescriptions: GLIPIZIDE XL 5 MG XR24H-TAB (GLIPIZIDE) 1 by mouth two times a day  #60 x 3   Entered and Authorized by:   Rowe Clack MD   Signed by:   Rowe Clack MD on 04/13/2010   Method used:   Electronically to        [facility] (retail)       [address]       [place], [place]  [address]       Ph: [phone]       Fax: [phone]   RxID:   [id number]    Immunization  History:  Tetanus/Td Immunization History:    Tetanus/Td:  historical (11/22/2004)    Colonoscopy  Procedure date:  11/22/2005  Findings:      Pt states was done @ hospital in [place] everything was normal

## 2010-12-22 NOTE — Assessment & Plan Note (Signed)
Summary: 3 MTH FU  STC   Vital Signs:  Patient profile:   60 year old male Height:      71 inches (180.34 cm) Weight:      205.6 pounds (93.45 kg) O2 Sat:      98 % on Room air Temp:     98.0 degrees F (36.67 degrees C) oral Pulse rate:   86 / minute BP sitting:   152 / 101  (left arm) Cuff size:   large  Vitals Entered By: Tomma Lightning (May 29, 2010 9:14 AM)  O2 Flow:  Room air CC: 3 month follow-up Is Patient Diabetic? Yes Did you bring your meter with you today? Yes CBG Result 105 CBG Device ID pt check BS at home   Primary Care Provider:  Rowe Clack MD  CC:  3 month follow-up.  History of Present Illness: here for f/u  1) DM2, improving control -reports checking sugars at home 3-4x/d - runs 100s-120s now - no hypoglycemic signs or symptoms - reports compliance with ongoing medical treatment and no changes in medication dose or frequency. denies adverse side effects related to current therapy.  never on insulin - no hx [facility] hosp reproted  2) HTN -reports compliance with ongoing medical treatment and no changes in medication dose or frequency. denies adverse side effects related to current therapy.  no vision changes or HA, no unilateral weakness -  3) CAD - follws with [facility] cards for same - reports compliance with ongoing medical treatment and no changes in medication dose or frequency. denies adverse side effects related to current therapy.  no CP or DOE or near syncope- no LE swelling  4) right shoulder pain - involves neck on right side too - chronic symptoms since on-job injury in 1999 - no tingling - no weakness, no HA or balance problems - symptoms not improved with tylenol or heat - some better with muscle relaxants - has seen ortho for same - rec surg but wishes to delay for now  5) c/o pain in right foot between 4th and 5th toe - most pain when staninding - no injury recalled -  6) c/o sinus and right ear "fullness" - +sinus discharge: yellow green- no  fever, no hering change or vision change -= no HA  Clinical Review Panels:  Diabetes Management   HgBA1C:  9.8 (05/15/2010)   Creatinine:  1.2 (02/25/2010)   Last Foot Exam:  yes (04/13/2010)  CBC   WBC:  6.3 (02/25/2010)   RBC:  5.03 (02/25/2010)   Hgb:  15.6 (02/25/2010)   Hct:  45.6 (02/25/2010)   Platelets:  199.0 (02/25/2010)   MCV  90.7 (02/25/2010)   MCHC  34.2 (02/25/2010)   RDW  13.1 (02/25/2010)   PMN:  48.8 (02/25/2010)   Lymphs:  39.1 (02/25/2010)   Monos:  8.8 (02/25/2010)   Eosinophils:  2.9 (02/25/2010)   Basophil:  0.4 (02/25/2010)  Complete Metabolic Panel   Glucose:  365 (02/25/2010)   Sodium:  138 (02/25/2010)   Potassium:  3.8 (02/25/2010)   Chloride:  98 (02/25/2010)   CO2:  32 (02/25/2010)   BUN:  12 (02/25/2010)   Creatinine:  1.2 (02/25/2010)   Albumin:  4.1 (02/25/2010)   Total Protein:  8.3 (02/25/2010)   Calcium:  9.3 (02/25/2010)   Total Bili:  1.1 (02/25/2010)   Alk Phos:  85 (02/25/2010)   SGPT (ALT):  29 (02/25/2010)   SGOT (AST):  23 (02/25/2010)   Current Medications (verified):  1)  Aspirin 325 Mg Tabs (Aspirin) .... Take 1 Tab By Mouth Every Day 2)  Carvedilol 6.25 Mg Tabs (Carvedilol) .... Take One Tablet By Mouth Twice A Day 3)  Lisinopril-Hydrochlorothiazide 20-12.5 Mg Tabs (Lisinopril-Hydrochlorothiazide) .... Take 1 Tablet By Mouth Once A Day 4)  Pravastatin Sodium 20 Mg Tabs (Pravastatin Sodium) .... Take One Tablet By Mouth Daily At Bedtime 5)  Nitrostat 0.4 Mg Subl (Nitroglycerin) .Marland Kitchen.. 1 Tablet Under Tongue At Onset of Chest Pain; You May Repeat Every 5 Minutes For Up To 3 Doses. 6)  Soma 350 Mg Tabs (Carisoprodol) .... Take 1 Three Times A Day As Needed For Muscle Spasms 7)  Claritin 10 Mg Tabs (Loratadine) .... Take 1 By Mouth Once Daily As Needed 8)  Glipizide Xl 5 Mg Xr24h-Tab (Glipizide) .Marland Kitchen.. 1 By Mouth Two Times A Day 9)  Metformin Hcl 500 Mg Xr24h-Tab (Metformin Hcl) .... 2 Tabs Two Times A Day 10)  Januvia 100 Mg  Tabs (Sitagliptin Phosphate) .Marland Kitchen.. 1 Tab Each Am 11)  Bromocriptine Mesylate 2.5 Mg Tabs (Bromocriptine Mesylate) .... 1/2 Tab At Bedtime  Allergies (verified): 1)  ! Penicillin 2)  ! * Shellfish  Past History:  Past Medical History: 1. CAD      a. s/p BMS to OM1 2001      b. repeat cath 2005: ISR of OM1 stent treated with DES 2. h/o LV dysfunction      a. previous EF 35-45%. EF on cath 1/05: 60%      b. ECHO 12/09: EF 60% 3. DM2, uncontrolled 4. HTN 5. Hyperlipidemia Allergic rhinitis GERD erectile dysfunction  MD roster: cards - [facility] - murphywainer  Review of Systems  The patient denies chest pain, syncope, peripheral edema, and headaches.    Physical Exam  General:  alert, well-developed, well-nourished, and cooperative to examination.    Ears:  R TM hazy with erythema - +mod effusion Mouth:  teeth and gums in good repair; mucous membranes moist, without lesions or ulcers. oropharynx clear without exudate, mod erythema. +PND Lungs:  normal respiratory effort, no intercostal retractions or use of accessory muscles; normal breath sounds bilaterally - no crackles and no wheezes.    Heart:  normal rate, regular rhythm, no murmur, and no rub. BLE without edema.  Msk:  +tenderness to palp over webspace of right 4/5 toe c/w morton's neuroma   Impression & Recommendations:  Problem # 1:  DIABETES MELLITUS, TYPE II, UNCONTROLLED (ICD-250.02) Assessment Improved  cont endo mgmt as ongoing His updated medication list for this problem includes:    Aspirin 325 Mg Tabs (Aspirin) .Marland Kitchen... Take 1 tab by mouth every day    Lisinopril-hydrochlorothiazide 20-12.5 Mg Tabs (Lisinopril-hydrochlorothiazide) .Marland Kitchen... Take 1 tablet by mouth once a day    Glipizide Xl 5 Mg Xr24h-tab (Glipizide) .Marland Kitchen... 1 by mouth two times a day    Metformin Hcl 500 Mg Xr24h-tab (Metformin hcl) .Marland Kitchen... 2 tabs two times a day    Januvia 100 Mg Tabs (Sitagliptin phosphate) .Marland Kitchen... 1 tab each  am  Labs Reviewed: Creat: 1.2 (02/25/2010)    Reviewed HgBA1c results: 9.8 (05/15/2010)  12.6 (02/25/2010)  Problem # 2:  HYPERTENSION, BENIGN (ICD-401.1)  up today but generally well controlled by hx review of prior OV - no med changes rec today but will monitor His updated medication list for this problem includes:    Carvedilol 6.25 Mg Tabs (Carvedilol) .Marland Kitchen... Take one tablet by mouth twice a day    Lisinopril-hydrochlorothiazide 20-12.5 Mg  Tabs (Lisinopril-hydrochlorothiazide) .Marland Kitchen... Take 1 tablet by mouth once a day  BP today: 152/101 Prior BP: 148/98 (05/15/2010)  Labs Reviewed: K+: 3.8 (02/25/2010) Creat: : 1.2 (02/25/2010)   Chol: 224 (02/25/2010)   HDL: 63.70 (02/25/2010)   LDL: 67 (04/20/2009)   TG: 230.0 (02/25/2010)  Problem # 3:  OTITIS MEDIA, ACUTE, RIGHT (ICD-382.9)  pain with right sinus pressure - tx Zpack - erx done His updated medication list for this problem includes:    Aspirin 325 Mg Tabs (Aspirin) .Marland Kitchen... Take 1 tab by mouth every day    Azithromycin 250 Mg Tabs (Azithromycin) .Marland Kitchen... 2 tabs by mouth today, then 1 by mouth daily starting tomorrow  Instructed on prevention and treatment. Call if no improvement in 48-72 hours or sooner if worsening symptoms.   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 Take 1 tab by mouth every day 2)  Carvedilol 6.25 Mg Tabs (Carvedilol) .... Take one tablet by mouth twice a day 3)  Lisinopril-hydrochlorothiazide 20-12.5 Mg Tabs (Lisinopril-hydrochlorothiazide) .... Take 1 tablet by mouth once a day 4)  Pravastatin Sodium 20 Mg Tabs (Pravastatin sodium) .... Take one tablet by mouth daily at bedtime 5)  Nitrostat 0.4 Mg Subl (Nitroglycerin) .Marland Kitchen.. 1 tablet under tongue at onset of chest pain; you may  repeat every 5 minutes for up to 3 doses. 6)  Soma 350 Mg Tabs (Carisoprodol) .... Take 1 three times a day as needed for muscle spasms 7)  Claritin 10 Mg Tabs (Loratadine) .... Take 1 by mouth once daily as needed 8)  Glipizide Xl 5 Mg Xr24h-tab (Glipizide) .Marland Kitchen.. 1 by mouth two times a day 9)  Metformin Hcl 500 Mg Xr24h-tab (Metformin hcl) .... 2 tabs two times a day 10)  Januvia 100 Mg Tabs (Sitagliptin phosphate) .Marland Kitchen.. 1 tab each am 11)  Bromocriptine Mesylate 2.5 Mg Tabs (Bromocriptine mesylate) .... 1/2 tab at bedtime 12)  Azithromycin 250 Mg Tabs (Azithromycin) .... 2 tabs by mouth today, then 1 by mouth daily starting tomorrow  Patient Instructions: 1)  it was good to see you today. 2)  labs reviewed today - 3)  Zpack antibiotics for your ear and sinus infection - your prescription has been electronically submitted to your pharmacy. Please take as directed. Contact our office if you believe you're having problems with the medication(s).  4)  Your foot pain is from Morton's neuroma - if this pain is getting worse, call orthopedics to consider a shit into this area - but the pain should imrpve slowly as it "burns out" over the next few months - this will take time! 5)  Check your blood sugars regularly. If your readings are usually above 200 or below 70 you should contact our office. 6)  It is important that your Diabetic A1c level is checked every 3 months. 7)  See your eye doctor yearly to check for diabetic eye damage. 8)  Please schedule a follow-up appointment in 4 months, sooner if problems.  Prescriptions: AZITHROMYCIN 250 MG TABS (AZITHROMYCIN) 2 tabs by mouth today, then 1 by mouth daily starting tomorrow  #6 x 0   Entered and Authorized by:   Rowe Clack MD   Signed by:   Rowe Clack MD on 05/29/2010   Method used:  Electronically to        [facility] (retail)       [address]       [place], [place]  [address]       Ph:  [phone]       Fax: [phone]   RxID:   [id number]

## 2010-12-22 NOTE — Assessment & Plan Note (Signed)
Summary: 1 YR/DMP   Visit Type:  Follow-up Primary Provider:  None  CC:  Chest pain and neck thru to back and wrist / sob.  History of Present Illness: [name] is a 60 year old gentleman with DM2, HTN, HL & CAD.  While living in [place] he had a bare metal stent placed in an obtuse marginal in the setting of refractory but stable angina.  This was complicated by in-stent restenosis in 2005 and DES was placed. Ejection fraction was 60% at the time of his cardiac catheterization. Myoviw May 2010 EF 51% normal perfusion.    Returns for 1 year f/u.  Not feeling well. Has not had his meds for over 8 months as he owed PCP money and wasn't able to see them until he paid.   Having pain everywhere. Hurts in neck, back, stomach, chest and legs. Describes CP as burning or pinching. Occurs 1-2 x/week. Can occur at anytime. Not related to exertion. Mild DOE. No LE edema, orthopnea or PND. No longer working. Not checking BPs or blood sugars.   Current Medications (verified): 1)  Aspirin 325 Mg Tabs (Aspirin) .... Take 1 Tab By Mouth Every Day 2)  Soma .... As Needed 3)  Metoprolol Tartrate 50 Mg Tabs (Metoprolol Tartrate) .... Take One Tablet By Mouth Twice A Day (Out) 4)  Metformin Hcl 1000 Mg Tabs (Metformin Hcl) .Marland Kitchen.. 1 Once Daily (Out) 5)  Lisinopril-Hydrochlorothiazide 20-12.5 Mg Tabs (Lisinopril-Hydrochlorothiazide) .... Take 1 Tablet By Mouth Once A Day (Out) 6)  Pravastatin Sodium 20 Mg Tabs (Pravastatin Sodium) .... Take One Tablet By Mouth Daily At Bedtime (Out) 7)  Nitrostat 0.4 Mg Subl (Nitroglycerin) .Marland Kitchen.. 1 Tablet Under Tongue At Onset of Chest Pain; You May Repeat Every 5 Minutes For Up To 3 Doses. (Out)  Allergies (verified): 1)  ! Penicillin 2)  ! * Shellfish  Past History:  Past Medical History: Last updated: 03/07/2009 1. CAD      a. s/p BMS to OM1 2001      b. repeat cath 2005: ISR of OM1 stent treated with DES 2. h/o LV dysfunction      a. previous EF 35-45%. EF on  cath 1/05: 60%      b. ECHO 12/09: EF 60% 3. DM2 4. HTN 5. Hyperlipidemia  Review of Systems       As per HPI and past medical history; otherwise all systems negative.   Vital Signs:  Patient profile:   60 year old male Height:      71 inches Weight:      203 pounds BMI:     28.42 Pulse rate:   81 / minute BP sitting:   180 / 108  (left arm) Cuff size:   regular  Vitals Entered By: Mignon Pine, RMA (February 24, 2010 4:08 PM)  Physical Exam  General:  Gen: well appearing. no resp difficulty HEENT: normal Neck: supple. no JVD. Carotids 2+ bilat; not bruits. No lymphadenopathy or thryomegaly appreciated. Cor: PMI nondisplaced. Regular rate & rhythm. No rubs,murmur. +s4 Lungs: clear Abdomen: soft, nontender, nondistended. No hepatosplenomegaly. No bruits or masses. Good bowel sounds. Extremities: no cyanosis, clubbing, rash, edema Neuro: alert & orientedx3, cranial nerves grossly intact. moves all 4 extremities w/o difficulty. affect pleasant    Impression & Recommendations:  Problem # 1:  CAD, NATIVE VESSEL (ICD-414.01) CP does not sound cardiac. Recent Myvoiew reassuring. Main focuse now is to get risk factors under control by restarting medicines. Will con't ASA. Resume Prava 20,  Lisinopril/HCTZ 30/12.5. Switch lopressor to coreg 6.25 two times a day. Titrate as needed. Check lipids, cmet, hgba1cand refer to PCP.  Problem # 2:  HYPERTENSION, BENIGN (ICD-401.1) BP elevated. Restarting anti-hypertensives.  Problem # 3:  HYPERLIPIDEMIA-MIXED (B2193296.4) Resuming pravastatin. Goal LDL < 70. Titrate as needed.  Problem # 4:  Non-compliance Counseled on need to be compliant with meds.  Problem # 5:  DIAB W/UNSPEC COMP TYPE II/UNSPEC TYPE UNCNTRL (ICD-250.92) Restarting metformin. Refer to establish new PCP.   Other Orders: EKG w/ Interpretation (93000) Primary Care Referral (Primary)  Patient Instructions: 1)  MEDS SENT TO [facility] [address] 2)  Carvedilol 6.25mg   two times a day  3)  Lisinopril-hctz 20/12.5mg  once daily  4)  Pravastatin 20mg  once daily  5)  Metformin 500mg  two times a day  6)  Nitroglycerin to take as needed for chest pain 7)  Your physician recommends that you return for a FASTING lipid, liver, cbc, hbg a1c, and bmet TOMMORROW 02/25/10 8)  You have been referred to Primary Care 9)  Follow up in 6 months Prescriptions: NITROSTAT 0.4 MG SUBL (NITROGLYCERIN) 1 tablet under tongue at onset of chest pain; you may repeat every 5 minutes for up to 3 doses.  #25 x 6   Entered by:   Kevan Rosebush, RN   Authorized by:   Jolaine Artist, MD, [facility]   Signed by:   Kevan Rosebush, RN on 02/24/2010   Method used:   Electronically to        [facility] (retail)       [address]       [place], [place]  [address]       Ph: [phone]       Fax: [phone]   RxID:   [id number] PRAVASTATIN SODIUM 20 MG TABS (PRAVASTATIN SODIUM) Take one tablet by mouth daily at bedtime  #90 x 1   Entered by:   Kevan Rosebush, RN   Authorized by:   Jolaine Artist, MD, [facility]   Signed by:   Kevan Rosebush, RN on 02/24/2010   Method used:   Electronically to        [facility] (retail)       [address]       [place], [place]  [address]       Ph: [phone]       Fax: [phone]   RxID:   [id number] LISINOPRIL-HYDROCHLOROTHIAZIDE 20-12.5 MG TABS (LISINOPRIL-HYDROCHLOROTHIAZIDE) Take 1 tablet by mouth once a day  #90 x 1   Entered by:   Kevan Rosebush, RN   Authorized by:   Jolaine Artist, MD, [facility]   Signed by:   Kevan Rosebush, RN on 02/24/2010   Method used:   Electronically to        [facility] (retail)       [address]       [place], [place]  [address]       Ph: [phone]       Fax: [phone]   [id number] METFORMIN HCL 500 MG TABS (METFORMIN HCL) Take 1 tablet by mouth two times a day  #60 x 1   Entered by:   Kevan Rosebush, RN   Authorized by:   Jolaine Artist, MD, [facility]   Signed by:  Kevan Rosebush, RN on 02/24/2010   Method used:   Electronically to        [facility] (retail)       [address]       [place], [place]  [address]       Ph: [phone]       Fax: [phone]   RxID:   [id number] CARVEDILOL 6.25 MG TABS (CARVEDILOL) Take one tablet by mouth twice a day  #180 x 1   Entered by:   Kevan Rosebush, RN   Authorized by:   Jolaine Artist, MD, [facility]   Signed by:   Kevan Rosebush, RN on 02/24/2010   Method used:   Electronically to        [facility] (retail)       [address]       [place], [place]  [address]       Ph: [phone]       Fax: [phone]   RxID:   [id number]

## 2010-12-22 NOTE — Progress Notes (Signed)
----   Converted from flag ---- ---- 04/16/2010 12:04 PM, Lucienne Capers wrote: Gave pt appt/phone:  05/04/10 @ 845A w/Dr. Loanne Drilling  ---- 04/16/2010 11:51 AM, Ophelia Charter wrote:   ---- 04/16/2010 11:50 AM, Ophelia Charter wrote: ------------------------------

## 2010-12-22 NOTE — Letter (Signed)
Summary: [facility]   Imported By: Phillis Knack 06/05/2010 11:52:16  _____________________________________________________________________  External Attachment:    Type:   Image     Comment:   External Document

## 2010-12-22 NOTE — Letter (Signed)
Summary: [facility]   Imported By: Phillis Knack 05/05/2010 11:28:10  _____________________________________________________________________  External Attachment:    Type:   Image     Comment:   External Document

## 2010-12-22 NOTE — Assessment & Plan Note (Signed)
Summary: NEW ENDO PER DR LESCHBER FLAG/MP AND DD-UNCONTR EVAL AND TREA...   Vital Signs:  Patient profile:   60 year old male Height:      71 inches (180.34 cm) Weight:      203.50 pounds (92.50 kg) BMI:     28.49 O2 Sat:      98 % on Room air Temp:     98.3 degrees F (36.83 degrees C) oral Pulse rate:   81 / minute BP sitting:   148 / 98  (left arm) Cuff size:   large  Vitals Entered By: Rebeca Alert MA (May 15, 2010 4:08 PM)  O2 Flow:  Room air CC: new endo pt per dr. Asa Lente DM II, uncontrolled/refill request on soma/aj Is Patient Diabetic? Yes   Primary Provider:  Rowe Clack MD  CC:  new endo pt per dr. Asa Lente DM II and uncontrolled/refill request on soma/aj.  History of Present Illness: pt states 6 years h/o dm.  it is complicated by cad.  he has never been on insulin, except in the hospital.  he takes 2 oral agents.  no cbg record, but states cbg's vary from 120-200's.  he was off dm meds at the time of the elevated a1c 2 mos ago.   pt says his diet is "good," but exercise is "not good."   symptomatically, pt states few years of intermittent slight "dizziness in the head," and associated numbness of the toes.  the dizziness is much improved.     Current Medications (verified): 1)  Aspirin 325 Mg Tabs (Aspirin) .... Take 1 Tab By Mouth Every Day 2)  Carvedilol 6.25 Mg Tabs (Carvedilol) .... Take One Tablet By Mouth Twice A Day 3)  Metformin Hcl 500 Mg Tabs (Metformin Hcl) .... Take 1 Tablet By Mouth Two Times A Day 4)  Lisinopril-Hydrochlorothiazide 20-12.5 Mg Tabs (Lisinopril-Hydrochlorothiazide) .... Take 1 Tablet By Mouth Once A Day 5)  Pravastatin Sodium 20 Mg Tabs (Pravastatin Sodium) .... Take One Tablet By Mouth Daily At Bedtime 6)  Nitrostat 0.4 Mg Subl (Nitroglycerin) .Marland Kitchen.. 1 Tablet Under Tongue At Onset of Chest Pain; You May Repeat Every 5 Minutes For Up To 3 Doses. 7)  Soma 350 Mg Tabs (Carisoprodol) .... Take 1 As Needed 8)  Claritin 10 Mg Tabs  (Loratadine) .... Take 1 By Mouth Once Daily As Needed 9)  Glipizide Xl 5 Mg Xr24h-Tab (Glipizide) .Marland Kitchen.. 1 By Mouth Two Times A Day  Allergies (verified): 1)  ! Penicillin 2)  ! * Shellfish  Past History:  Past Medical History: Last updated: 04/13/2010 1. CAD      a. s/p BMS to OM1 2001      b. repeat cath 2005: ISR of OM1 stent treated with DES 2. h/o LV dysfunction      a. previous EF 35-45%. EF on cath 1/05: 60%      b. ECHO 12/09: EF 60% 3. DM2, uncontrolled 4. HTN 5. Hyperlipidemia Allergic rhinitis GERD erectile dysfunction  MD rooster: cards - [facility] - Kenetra Hildenbrand  Family History: Is insignificant for coronary artery disease in immediate  relatives. Family History of Colon CA 1st degree relative <60 Family History of Prostate CA 1st degree relative <50 no dm in immediate family  Social History: Reviewed history from 04/13/2010 and no changes required. The patient lives in [place] with his wife.   He is disabled secondary to back pain.   Prev worked as Curator in [place] He quit smoking cigarettes in 1984 denies  any alcohol use.  Review of Systems       The patient complains of headaches.         denies blurry vision, sob, n/v, urinary frequency, excessive diaphoresis, memory loss, depression, hypoglycemia, rhinorrhea, and easy bruising.  he has lost 47 lb x 2 years.  he sees cardiol for chest pain.  he has muscle cramps.  Physical Exam  General:  obese.  no distress  Head:  head: no deformity eyes: no periorbital swelling, no proptosis external nose and ears are normal mouth: no lesion seen Neck:  Supple without thyroid enlargement or tenderness.  Lungs:  Clear to auscultation bilaterally. Normal respiratory effort.  Heart:  Regular rate and rhythm without murmurs or gallops noted. Normal S1,S2.   Msk:  muscle bulk and strength are grossly normal.  no obvious joint swelling.  gait is normal and steady  Pulses:  dorsalis pedis intact  bilat.  no carotid bruit  Extremities:  no deformity.  no ulcer on the feet.  feet are of normal color and temp.  trace right pedal edema and trace left pedal edema.   Neurologic:  cn 2-12 grossly intact.   readily moves all 4's.   sensation is intact to touch on the feet  Skin:  normal texture and temp.  no rash.  not diaphoretic  Cervical Nodes:  No significant adenopathy.  Psych:  Alert and cooperative; normal mood and affect; normal attention span and concentration.   Additional Exam:  Hemoglobin A1C       [H]  9.8 %     Impression & Recommendations:  Problem # 1:  DIABETES MELLITUS, TYPE II, UNCONTROLLED (ICD-250.02) needs increased rx  Problem # 2:  dizziness ? due to #1 improved  Problem # 3:  weight loss prob due to severe hyperglycemia  Medications Added to Medication List This Visit: 1)  Soma 350 Mg Tabs (Carisoprodol) .... Take 1 three times a day as needed for muscle spasms 2)  Metformin Hcl 500 Mg Xr24h-tab (Metformin hcl) .... 2 tabs two times a day 3)  Januvia 100 Mg Tabs (Sitagliptin phosphate) .Marland Kitchen.. 1 tab each am 4)  Bromocriptine Mesylate 2.5 Mg Tabs (Bromocriptine mesylate) .... 1/2 tab at bedtime  Other Orders: TLB-A1C / Hgb A1C (Glycohemoglobin) (83036-A1C) Est. Patient Level IV [id number]  Patient Instructions: 1)  good diet and exercise habits significanly improve the control of your diabetes.  please let me know if you wish to be referred to a dietician.  high blood sugar is very risky to your health.  you should see an eye doctor every year. 2)  controlling your blood pressure and cholesterol drastically reduces the damage diabetes does to your body.  this also applies to quitting smoking.  please discuss these with your doctor.  you should take an aspirin every day, unless you have been advised by a doctor not to. 3)  check your blood sugar 1-2 times a day.  vary the time of day when you check, between before the 3 meals, and at bedtime.  also check if you  have symptoms of your blood sugar being too high or too low.  please keep a record of the readings and bring it to your next appointment here.  please call us sooner if you are having low blood sugar episodes. 4)  a blood sugar test are being ordered for you today.  please call [phone] to hear your test results.  it is possible that i would recommend additional diabetes medication.  5)  change metformin to -xr, 2x 500 mg two times a day. 6)  Please schedule a follow-up appointment in 3 months. 7)  (update: i left message on phone-tree:  add januvia 100 mg each am, and bromocriptine 1/2 of 2.5 mg at bedtime.  it is unlikely that oral agents will control your diabetes.  ret 3 weeks.  you should consider insulin). Prescriptions: JANUVIA 100 MG TABS (SITAGLIPTIN PHOSPHATE) 1 tab each am  #30 x 11   Entered and Authorized by:   Donavan Foil MD   Signed by:   Donavan Foil MD on 05/15/2010   Method used:   Electronically to        [facility] (retail)       [address]       [place], [place]  [address]       Ph: [phone]       Fax: [phone]   RxID:   [id number] SOMA 350 MG TABS (CARISOPRODOL) take 1 three times a day as needed for muscle spasms  #90 x 0   Entered and Authorized by:   Donavan Foil MD   Signed by:   Donavan Foil MD on 05/15/2010   Method used:   Electronically to        [facility] (retail)       [address]       [place], [place]  [address]       Ph: [phone]       Fax: [phone]   [id number] METFORMIN HCL 500 MG XR24H-TAB (METFORMIN HCL) 2 tabs two times a day  #120 x 11   Entered and Authorized by:   Donavan Foil MD   Signed by:   Donavan Foil MD on 05/15/2010   Method used:   Electronically to        [facility] (retail)       [address]       [place], [place]  [address]       Ph: [phone]       Fax: [phone]   RxID:    [id number]

## 2010-12-22 NOTE — Letter (Signed)
Summary: [facility]  [facility]   Imported By: Phillis Knack 05/18/2010 11:18:57  _____________________________________________________________________  External Attachment:    Type:   Image     Comment:   External Document

## 2010-12-22 NOTE — Consult Note (Signed)
Summary: [facility]  [facility]   Imported By: Phillis Knack 05/05/2010 10:49:37  _____________________________________________________________________  External Attachment:    Type:   Image     Comment:   External Document

## 2010-12-22 NOTE — Procedures (Signed)
Summary: Colonoscopy/[facility]  Colonoscopy/[facility]   Imported By: Phillis Knack 04/15/2010 13:23:06  _____________________________________________________________________  External Attachment:    Type:   Image     Comment:   External Document

## 2010-12-22 NOTE — Letter (Signed)
Summary: [facility]   Imported By: Phillis Knack 05/05/2010 11:27:04  _____________________________________________________________________  External Attachment:    Type:   Image     Comment:   External Document

## 2010-12-24 NOTE — Letter (Signed)
Summary: [facility]  [facility]   Imported By: Phillis Knack 12/11/2010 10:35:09  _____________________________________________________________________  External Attachment:    Type:   Image     Comment:   External Document

## 2010-12-24 NOTE — Assessment & Plan Note (Signed)
Summary: FU/NWS   Vital Signs:  Patient profile:   60 year old male Height:      71 inches (180.34 cm) Weight:      202 pounds (91.82 kg) BMI:     28.28 O2 Sat:      98 % on Room air Temp:     98.9 degrees F (37.17 degrees C) oral Pulse rate:   90 / minute BP sitting:   138 / 98  (left arm) Cuff size:   large  Vitals Entered By: Rebeca Alert CMA Deborra Medina) (October 29, 2010 1:21 PM)  O2 Flow:  Room air CC: Follow-up visit/aj Is Patient Diabetic? Yes   Primary Provider:  Rowe Clack MD  CC:  Follow-up visit/aj.  History of Present Illness: since last ov, pt stopped meds altogether.  he resumed 2 days ago.  since then, cbg's have improved to high-100's.  he says he will not give himself injections.  he did not start [place] and parlodel, due to cost.    Current Medications (verified): 1)  Aspirin 325 Mg Tabs (Aspirin) .... Take 1 Tab By Mouth Every Day 2)  Carvedilol 6.25 Mg Tabs (Carvedilol) .... Take One Tablet By Mouth Twice A Day 3)  Lisinopril-Hydrochlorothiazide 20-12.5 Mg Tabs (Lisinopril-Hydrochlorothiazide) .... Take 1 Tablet By Mouth Once A Day 4)  Pravastatin Sodium 20 Mg Tabs (Pravastatin Sodium) .... Take One Tablet By Mouth Daily At Bedtime 5)  Nitrostat 0.4 Mg Subl (Nitroglycerin) .Marland Kitchen.. 1 Tablet Under Tongue At Onset of Chest Pain; You May Repeat Every 5 Minutes For Up To 3 Doses. 6)  Soma 350 Mg Tabs (Carisoprodol) .... Take 1 Three Times A Day As Needed For Muscle Spasms 7)  Claritin 10 Mg Tabs (Loratadine) .... Take 1 By Mouth Once Daily As Needed 8)  Glipizide Xl 5 Mg Xr24h-Tab (Glipizide) .Marland Kitchen.. 1 By Mouth Two Times A Day 9)  Metformin Hcl 500 Mg Xr24h-Tab (Metformin Hcl) .... 2 Tabs Two Times A Day 10)  Bromocriptine Mesylate 2.5 Mg Tabs (Bromocriptine Mesylate) .... 1/2 Tab At Bedtime 11)  Augmentin 875-125 Mg Tabs (Amoxicillin-Pot Clavulanate) .Marland Kitchen.. 1 By Mouth Two Times A Day X 7 Days 12)  Tessalon Perles 100 Mg Caps (Benzonatate) .Marland Kitchen.. 1 By Mouth Tids X  5 Days, Then As Needed For Cough Symptoms  Allergies (verified): 1)  ! Penicillin 2)  ! * Shellfish  Past History:  Past Medical History: Last updated: 10/26/2010 1. CAD      a. s/p BMS to OM1 2001      b. repeat cath 2005: ISR of OM1 stent treated with DES 2. h/o LV dysfunction      a. previous EF 35-45%. EF on cath 1/05: 60%      b. ECHO 12/09: EF 60% 3. DM2, uncontrolled 4. HTN 5. Hyperlipidemia Allergic rhinitis GERD  erectile dysfunction  MD roster: cards - [place] [facility] - murphywainer  Review of Systems  The patient denies hypoglycemia.    Physical Exam  General:  obese.  no distress  Psych:  Alert and cooperative; normal mood and affect; normal attention span and concentration.   Additional Exam:  Hemoglobin A1C       [H]  13.6 %     Impression & Recommendations:  Problem # 1:  DIABETES MELLITUS, TYPE II, UNCONTROLLED (ICD-250.02) he probably needs insulin for adeq control, but he refuses  Medications Added to Medication List This Visit: 1)  Januvia 100 Mg Tabs (Sitagliptin phosphate) .Marland Kitchen.. 1 tab  each am  Other Orders: Est. Patient Level III [id number])  Patient Instructions: 1)  check your blood sugar 1-2 times a day.  vary the time of day when you check, between before the 3 meals, and at bedtime.  also check if you have symptoms of your blood sugar being too high or too low.  please keep a record of the readings and bring it to your next appointment here.  please call us sooner if you are having low blood sugar episodes. 2)  Please schedule a follow-up appointment in 3 months. 3)  you should take all 4 diabetes pills, as listed below.   Prescriptions: JANUVIA 100 MG TABS (SITAGLIPTIN PHOSPHATE) 1 tab each am  #30 x 11   Entered and Authorized by:   Donavan Foil MD   Signed by:   Donavan Foil MD on 10/29/2010   Method used:   Print then Give to Patient   RxID:   [id number] JANUVIA 100 MG TABS (SITAGLIPTIN PHOSPHATE) 1 tab  each am  #30 x 11   Entered and Authorized by:   Donavan Foil MD   Signed by:   Donavan Foil MD on 10/29/2010   Method used:   Electronically to        [facility] (retail)       [address]       [place], [place]  [address]       Ph: [phone]       Fax: [phone]   [id number] BROMOCRIPTINE MESYLATE 2.5 MG TABS (BROMOCRIPTINE MESYLATE) 1/2 tab at bedtime  #15 x 5   Entered and Authorized by:   Donavan Foil MD   Signed by:   Donavan Foil MD on 10/29/2010   Method used:   Electronically to        [facility] (retail)       [address]       [place], [place]  [address]       Ph: [phone]       Fax: [phone]   RxID:   [id number]    Orders Added: 1)  Est. Patient Level III [id number]

## 2010-12-24 NOTE — Medication Information (Signed)
Summary: [facility]  [facility]   Imported By: Bubba Hales 11/10/2010 09:30:52  _____________________________________________________________________  External Attachment:    Type:   Image     Comment:   External Document

## 2011-01-27 ENCOUNTER — Telehealth: Payer: Self-pay | Admitting: Internal Medicine

## 2011-01-29 ENCOUNTER — Encounter: Payer: Self-pay | Admitting: Endocrinology

## 2011-01-29 ENCOUNTER — Other Ambulatory Visit: Payer: Self-pay | Admitting: Endocrinology

## 2011-01-29 ENCOUNTER — Ambulatory Visit (INDEPENDENT_AMBULATORY_CARE_PROVIDER_SITE_OTHER): Payer: Medicare Other | Admitting: Endocrinology

## 2011-01-29 ENCOUNTER — Other Ambulatory Visit: Payer: Medicare Other

## 2011-01-29 DIAGNOSIS — E1165 Type 2 diabetes mellitus with hyperglycemia: Secondary | ICD-10-CM

## 2011-01-29 DIAGNOSIS — IMO0001 Reserved for inherently not codable concepts without codable children: Secondary | ICD-10-CM

## 2011-01-29 LAB — HEMOGLOBIN A1C: Hgb A1c MFr Bld: 8.3 % — ABNORMAL HIGH (ref 4.6–6.5)

## 2011-02-02 NOTE — Progress Notes (Signed)
Summary: Call inquiry  Phone Note Call from Patient   Caller: Patient Call For: Dr. Loanne Drilling Appointment Reminder Summary of Call: Pt called and stated that he had a missed call from [place] yesterday (01/26/11) w/o a messsage.  Initial call taken by: Mackey Birchwood [facility]),  January 27, 2011 9:54 AM  Follow-up for Phone Call        Called Pt and informed call was for a reminder od Appt w/Dr Loanne Drilling on Friday 01/29/2011 at 10:15am Follow-up by: Mackey Birchwood [facility]),  January 27, 2011 9:54 AM

## 2011-02-02 NOTE — Assessment & Plan Note (Addendum)
Summary: 3 month follow up/ nws   Vital Signs:  Patient profile:   60 year old male Height:      71 inches (180.34 cm) Weight:      210.13 pounds (95.51 kg) BMI:     29.41 O2 Sat:      98 % on Room air Temp:     99.3 degrees F (37.39 degrees C) oral Pulse rate:   96 / minute BP sitting:   142 / 108  (left arm) Cuff size:   large  Vitals Entered By: Rebeca Alert CMA Deborra Medina) (January 29, 2011 10:24 AM)  O2 Flow:  Room air CC: 3 month F/U/aj Is Patient Diabetic? Yes Comments Pt is no longer taking Januvia   Primary Provider:  Rowe Clack MD  CC:  3 month F/U/aj.  History of Present Illness: no cbg record, but states cbg's are low-100's.  he ran out of Tong[place] 2 mos ago.  hr reduced metformin to  2 tabs per day.    Current Medications (verified): 1)  Aspirin 325 Mg Tabs (Aspirin) .... Take 1 Tab By Mouth Every Day 2)  Carvedilol 6.25 Mg Tabs (Carvedilol) .... Take One Tablet By Mouth Twice A Day 3)  Lisinopril-Hydrochlorothiazide 20-12.5 Mg Tabs (Lisinopril-Hydrochlorothiazide) .... Take 1 Tablet By Mouth Once A Day 4)  Pravastatin Sodium 20 Mg Tabs (Pravastatin Sodium) .... Take One Tablet By Mouth Daily At Bedtime 5)  Nitrostat 0.4 Mg Subl (Nitroglycerin) .Marland Kitchen.. 1 Tablet Under Tongue At Onset of Chest Pain; You May Repeat Every 5 Minutes For Up To 3 Doses. 6)  Soma 350 Mg Tabs (Carisoprodol) .... Take 1 Three Times A Day As Needed For Muscle Spasms 7)  Claritin 10 Mg Tabs (Loratadine) .... Take 1 By Mouth Once Daily As Needed 8)  Glipizide Xl 5 Mg Xr24h-Tab (Glipizide) .Marland Kitchen.. 1 By Mouth Two Times A Day 9)  Metformin Hcl 500 Mg Xr24h-Tab (Metformin Hcl) .... 2 Tabs Two Times A Day 10)  Bromocriptine Mesylate 2.5 Mg Tabs (Bromocriptine Mesylate) .... 1/2 Tab At Bedtime 11)  Januvia 100 Mg Tabs (Sitagliptin Phosphate) .Marland Kitchen.. 1 Tab Each Am  Allergies (verified): 1)  ! Penicillin 2)  ! * Shellfish  Past History:  Past Medical History: Last updated: 10/26/2010 1. CAD  a. s/p BMS to OM1 2001      b. repeat cath 2005: ISR of OM1 stent treated with DES 2. h/o LV dysfunction      a. previous EF 35-45%. EF on cath 1/05: 60%      b. ECHO 12/09: EF 60% 3. DM2, uncontrolled 4. HTN 5. Hyperlipidemia Allergic rhinitis GERD  erectile dysfunction  MD roster: cards - [place] [facility] - murphywainer  Review of Systems  The patient denies hypoglycemia.    Physical Exam  General:  obese.  no distress  Psych:  Alert and cooperative; normal mood and affect; normal attention span and concentration.   Additional Exam:  Hemoglobin A1C       [H]  8.3 %       Impression & Recommendations:  Problem # 1:  DIABETES MELLITUS, TYPE II, UNCONTROLLED (ICD-250.02) therapy limited by noncompliance.  i'll do the best i can.  Medications Added to Medication List This Visit: 1)  Bromocriptine Mesylate 2.5 Mg Tabs (Bromocriptine mesylate) .Marland Kitchen.. 1 tab at bedtime  Other Orders: TLB-A1C / Hgb A1C (Glycohemoglobin) (83036-A1C) Est. Patient Level III [id number]  Patient Instructions: 1)  check your blood sugar 1-2 times a day.  vary the time of day when you check, between before the 3 meals, and at bedtime.  also check if you have symptoms of your blood sugar being too high or too low.  please keep a record of the readings and bring it to your next appointment here.  please call us sooner if you are having low blood sugar episodes. 2)  Please schedule a follow-up appointment in 3 months. 3)  blood tests are being ordered for you today.  please call [phone] to hear your test results.  if this blood test is very high again, insulin will be necessary to control your blood sugar.   4)  (update: i left message on phone-tree:  resume januvia, and double the parlodel) Prescriptions: JANUVIA 100 MG TABS (SITAGLIPTIN PHOSPHATE) 1 tab each am  #30 x 11   Entered and Authorized by:   Donavan Foil MD   Signed by:   Donavan Foil MD on 01/29/2011   Method used:    Electronically to        [facility] (retail)       [address]       [place], [place]  [address]       Ph: [phone]       Fax: [phone]   RxID:   [id number] BROMOCRIPTINE MESYLATE 2.5 MG TABS (BROMOCRIPTINE MESYLATE) 1 tab at bedtime  #30 x 11   Entered and Authorized by:   Donavan Foil MD   Signed by:   Donavan Foil MD on 01/29/2011   Method used:   Electronically to        [facility] (retail)       [address]       [place], [place]  [address]       Ph: [phone]       Fax: [phone]   RxID:   [id number]    Orders Added: 1)  TLB-A1C / Hgb A1C (Glycohemoglobin) [83036-A1C] 2)  Est. Patient Level III [id number]

## 2011-04-06 NOTE — H&P (Signed)
[name], [name] NO.:  [id number]   MEDICAL RECORD NO.:  [id number]          PATIENT TYPE:  INP   LOCATION:  3013                         FACILITY:  [facility]   PHYSICIAN:  Farris Has, MDDATE OF BIRTH:  03/14/1951   DATE OF ADMISSION:  11/07/2008  DATE OF DISCHARGE:                              HISTORY & PHYSICAL   PRIMARY CARE [name]:  Vikki Ports, M.D.   Patient is a 60 year old gentleman with a history of coronary artery  disease, hypertension, diabetes, who had stopped taking all of his  medication and stopped having followup for about a year now.  He comes  in because the past few weeks, he has been having intermittent left  facial numbness and right hand weakness that came and went.  He had a  few days of episodes of this.  The episodes would last more than a few  minutes or so and would go away.  He did not seek any medical attention,  but today at 6 p.m., he noticed that his symptoms persisted and got  somewhat worse, which brought him into the emergency department at 2326  p.m. on the 17th, at which point he was out of the window for t-PA.  CT  scan of the head was done and showed possible white matter changes, more  advanced than his age would suggest, otherwise unremarkable.  Currently,  he still has some mild left facial paresthesias, although strength is  5/5 in all 4 extremities.  [facility] was called for further  admission.  Patient denies any chest pain, shortness of breath, although  has some dyspnea on exertion when he walks to his mailbox, otherwise  review of systems unremarkable.  No fevers or chills.  No nausea, no  vomiting, no diarrhea.  He does have a little constipation, per wife,  maybe has lost some weight.  He has many deaths in his family recently,  to which his wife attributes a lot of his symptoms.   PAST MEDICAL HISTORY:  1. Coronary artery disease, status post stenting in 2005 and in 1999      in [place].  2. Hypertension.  3. Diabetes.  4. History of left knee pain.   SOCIAL HISTORY:  Patient does not smoke.  Quit in 1982.  Does not use  drugs.  Nondrinker.   ALLERGIES:  PENICILLIN.   MEDICATIONS:  Aspirin 325 daily, otherwise nothing.  He used to take  Plavix, which he stopped a year ago, and Metformin, which he also  stopped.  He cannot recollect the rest of his medicines.   PHYSICAL EXAMINATION:  VITALS:  Temperature 98, blood pressure 173/100,  respirations 20, pulse 81, satting 97% on room air.  Patient appears to be in no acute distress.  Head nontraumatic.  Moist mucous membranes.  HEART:  Regular rate and rhythm.  No murmurs, rubs or gallops.  ABDOMEN:  Soft, nontender, nondistended.  LOWER EXTREMITIES:  Without clubbing, cyanosis or edema.  Strength 5/5  in all 4 extremities.  Patient endorses decreased sensation on the left  face.  Nose to finger:  Coordination within normal  limits.  Cranial  nerves II-XII are intact. Otherwise neurologically intact.   LABS:  White blood cell count 7.2, hemoglobin 14.3.  Sodium 136,  potassium 3.8, creatinine 1.6.  Cardiac enzymes within normal limits.  UA within normal limits.   EKG showing early repolarization, unchanged from prior, otherwise normal  sinus rhythm.   Chest x-ray showing cardiomegaly with mild pulmonary congestion.   CT scan of the head showing significant white matter changes, too  advanced for patient's age.   ASSESSMENT/PLAN:  This is a 60 year old gentleman who has failed to  follow up with history of diabetes, hypertension, and coronary artery  disease, who now presents with possible transient ischemic  attack/cerebrovascular accident, which could be early still.  We will  admit to telemetry for possible transient ischemic  attack/cerebrovascular accident.  Will further risk-stratify with  obtaining fasting lipid panel, hemoglobin A1C, homocysteine level.  Will  obtain MRI/MRA, 2D echo, carotid  Dopplers.  Would recommend neurology  consult to discuss the patient which is his best option and medications  for prophylaxis as well as intake and consideration of his social and  financial situation.  Patient currently could not afford Plavix.  Aggrenox may be a good alternative if he is more than 4 years out from  his last stent.   Hypertension:  Right now, not sure if he is having cerebrovascular  accident, will avoid overtreatment but will write for hydralazine p.r.n.  Later on recommend ACE inhibitor.  Would consider starting on a low dose  of lisinopril.   Diabetes, uncontrolled:  Will do sliding-scale insulin.  Since his blood  sugar is currently in the 320 range, will give Lantus 5 units for  baseline control.  Probably will need to be adjusted.  Will need to  start him on p.o. medications when he is discharged.  Will check  hemoglobin A1C level.  Patient may not cooperate with insulin.  Probably  would benefit from diabetes education.   Prophylaxis:  Protonix and SCDs.   History of coronary artery disease, currently chest pain, although this  had some shortness of breath on exertion.  Will obtain a 2D echo.  One  set of cardiac markers.  EKG does not show any evidence of acute  ischemia and infarction.  Will make sure he is on aspirin and statin if  appropriate.  May need to benefit from beta blocker as well if  lisinopril is not controlling his blood pressure.  Patient will need  close followup.      Farris Has, MD  Electronically Signed     AVD/MEDQ  D:  11/08/2008  T:  11/08/2008  Job:  [id number]   cc:   Vikki Ports, M.D.

## 2011-04-06 NOTE — Assessment & Plan Note (Signed)
[facility] OFFICE NOTE   ABDALLA, ADORNETTO                   MRN:          [id number]  DATE:09/12/2007                            DOB:          23-Dec-1950    PRIMARY CARE PHYSICIAN:  Vikki Ports, M.D.   INTERVAL HISTORY:  Mr. Doerflein is a 60 year old male with a history of  coronary artery disease as well as hypertension, hyperlipidemia, and  diabetes.  He had a bare metal stent placed to his obtuse marginal in  2001 in the setting of stable angina.  He underwent repeat  catheterization in March, 2005 for recurrent angina.  He had end-stent  restenosis and underwent placement of a drug-eluting stent by Dr.  Albertine Patricia.  Ejection fraction was previously noted to be in the 35-45%  range in January, 2005, but on catheterization, ejection fraction was  60%.  He previously followed Dr. Albertine Patricia and presents today for his  yearly followup.  He tells me that about a year ago, he stopped all of  his medications and became a vegan because he says he just was not  feeling right.  The only thing he is currently taking is his aspirin and  his Soma for his musculoskeletal pain.  He says he feels quite well, has  an occasional pinching feeling in his chest, which is very different  from his previous angina.  He typically just chews an aspirin, and it  goes away.  There is no exertional component to it.  He denies any  neurologic symptoms.  No dyspnea.  No orthopnea.  No PND.  He is not  checking his blood sugars.   CURRENT MEDICATIONS:  1. Aspirin 325 a day.  2. Soma 350 as needed.   PHYSICAL EXAMINATION:  He is in no acute distress.  He ambulates around  the clinic without any respiratory difficulty.  Respirations are  unlabored.  Blood pressure is 172/112.  On several manual rechecks, it  stays the same.  Heart rate is 72.  Weight is 214, which is down about  20 pounds from last year.  HEENT:  Normal.  NECK:  Supple.   No JVD.  Carotids are 2+ bilaterally without any bruits.  There is no lymphadenopathy or thyromegaly.  CARDIAC:  PMI is not displaced.  He has a regular rate and rhythm with  an S4.  No murmur.  LUNGS:  Clear.  ABDOMEN:  Soft, nontender, nondistended.  Mildly obese.  No  hepatosplenomegaly.  No bruits.  No masses.  Good bowel sounds.  EXTREMITIES:  Warm with no clubbing, cyanosis or edema.  No rash.  NEURO:  Alert and oriented x3.  Cranial nerves II-XII are intact.  Moves  all four extremities without difficulty.  Affect is pleasant.   EKG shows normal sinus rhythm with LVH and a minimal ST scooping.  Heart  rate is 72.   ASSESSMENT/PLAN:  1. Coronary artery disease:  This is stable without any evidence of      ischemia.  I think his chest pain is noncardiac.  2. Hypertension.  This is very poorly controlled  in the setting of      medication noncompliance.  We had a long talk about the risks of      untreated blood pressure, including stroke, current heart attacks,      and renal failure.  3. Diabetes:  Followed by Dr. Tomi Bamberger.  Remains untreated.  4. Medication noncompliance:  As above.  We had a long talk about the      sequelae of untreated hypertension and coronary artery disease.  I      strongly suggested that he resume some of his medications.  After a      long discussion, he has finally agreed to consider this.  We have      given a prescription for lisinopril 40, HCTZ 25.  We have also      started him on Lovastatin 40 a day.  I have recommended that he      follow up with Dr. Tomi Bamberger to resume his diabetes medication and      checking his sugars.  We will bring him back next week for CMET,      lipid panel, and hemoglobin A1C.  I told him if he develops any      angina or neurologic symptoms, to call 911 immediately.  We did      also discuss restarting his Plavix, and he said he would consider      this.   DISPOSITION:  I will see him back in 2-3 weeks for  followup.     Shaune Pascal. Bensimhon, MD  Electronically Signed    DRB/MedQ  DD: 09/12/2007  DT: 09/13/2007  Job #: [id number]   cc:   Vikki Ports, M.D.

## 2011-04-09 NOTE — Assessment & Plan Note (Signed)
[place] OFFICE NOTE   [name], [name]                   MRN:          [id number]  DATE:09/06/2006                            DOB:          Apr 21, 1951    HISTORY OF PRESENT ILLNESS:  [name] is a 60 year old gentleman with  coronary disease.  While living in [place] he had a bare metal stent placed  in an obtuse marginal in the setting of refractory but stable angina.  He  then represented with refractory angina and was found to have in stent  restenosis.  I treated this with a placement of a drug eluting stent.  He  has had no recurrent angina.  Ejection fraction was 60% at the time of his  cardiac catheterization.   [name] continues to do well.  He has been working to lose some weight  and succeeded in losing 13 pounds since our last visit.  He has not had any  chest discomfort, PND, orthopnea, edema, claudication or symptoms of stroke.   CURRENT MEDICATIONS:  1. Zocor 80 mg per day.  2. Enteric coated aspirin 325 mg per day.  3. Toprol XL 50 mg per day.  4. Plavix 75 mg per day.  5. Metformin 500 mg twice per day.  6. Avalide 12.5/300 1 per day.  7. Tetracycline.  8. Glipizide 10 mg per day.   PHYSICAL EXAMINATION:  GENERAL:  He is generally well-appearing in no  distress.  VITAL SIGNS:  Heart rate 76, blood pressure 128/80 and weight of 216 pounds.  Weight is down 13 pounds since I last saw him in 2005.  NECK:  He has no jugular venous distention, no thyromegaly.  LUNGS:  Are clear to auscultation.  CARDIAC:  He has a nondisplaced point of maximal cardiac impulse.  There is  a regular rate and rhythm with normal S1 and S2.  There is no S3, S4, or  murmur.  ABDOMEN:  Is soft, nondistended, nontender.  There is no hepatosplenomegaly  and no midline pulse without mass.  EXTREMITIES:  Are warm without edema.  Bilateral femoral pulses are 2+  without bruit.   Electrocardiogram  demonstrates normal sinus rhythm and is a normal  electrocardiogram.  There is no change from previous.   IMPRESSION/RECOMMENDATIONS:  1. Coronary disease:  Drug eluting stent in the 1st obtuse marginal      branch.  Continue aspirin and Plavix indefinitely.  Decrease aspirin to      81 mg per day.  2. Exertional dyspnea:  Improved with weight loss.  3. Hypertension:  Good control on Avalide.  4. Diabetes mellitus:  Managed by Dr. Tomi Bamberger.  5. Hypercholesterolemia:  Managed by Dr. Tomi Bamberger:  Goal LDL less than 70.   I will plan on seeing him back in a year's time.       Ethelle Lyon, MD     WED/MedQ  DD:  09/06/2006  DT:  09/07/2006  Job #:  [id number]   cc:   Vikki Ports, M.D.

## 2011-04-09 NOTE — H&P (Signed)
NAME:  [name], PENDERGRAPH NO.:  [id number]   MEDICAL RECORD NO.:  [id number]                   PATIENT TYPE:  INP   LOCATION:  2009                                 FACILITY:  [facility]   PHYSICIAN:  Junious Silk, M.D. [facility]         DATE OF BIRTH:  1951/09/12   DATE OF ADMISSION:  02/12/2004  DATE OF DISCHARGE:                                HISTORY & PHYSICAL   PRIMARY CARE PHYSICIAN:  Dr. Loma Newton, [place], [facility]   PRIMARY CARDIOLOGIST:  Dr. Sabino Snipes   CHIEF COMPLAINT:  Chest pain.   HISTORY OF PRESENT ILLNESS:  Mr. Ditomaso is a very pleasant 60 year old  married African-American male with a history of coronary artery disease,  status post angioplasty and stenting in 2000 at [facility] in [place], [place], hypertension, hypercholesterolemia and borderline diabetes  mellitus, who presents to the emergency room today with complaints of chest  discomfort since one o'clock this afternoon.  This began shortly after  awakening from a nap.  This is described as a sharp/tight pain in his left  chest.  It radiates to his left shoulder and down his left arm.  He notes  associated shortness of breath and diaphoresis, but no nausea or syncope.  It is not particularly worse with exertion.  It is not particularly better  with rest.  It did get a little better today when he received aspirin and  nitroglycerin.  He does note some worsening discomfort when he takes a deep  breath, as well as leaning forward, as well as movements of his bilateral  arms.  He is still having discomfort now, rated as 5/10.  He is currently on  nitroglycerin drip and heparin drip is yet to be started.   PAST MEDICAL HISTORY:  As noted above, he has a history of coronary artery  disease, status post PTI in 2000.  He has a history of hypertension,  hypercholesterolemia and borderline diabetes mellitus.  He has a history of  degenerative disk disease and lower  back pain secondary to a previous  injury.  He has a history of dental surgery and left knee arthroscopy.  The  patient states he had a recent Cardiolite scan in our office that was normal  per his report.   ALLERGIES:  PENICILLIN and SHELLFISH.   MEDICATIONS:  1. Zocor 80 mg q.h.s.  2. Soma t.i.d. p.r.n.  3. Aspirin 81 mg.  4. Avalide 12.5/300 mg daily.   SOCIAL HISTORY:  The patient lives in [place] with his wife.  He is  disabled secondary to back pain.  He quit smoking cigarettes in 1988, and  denies any alcohol abuse.   FAMILY HISTORY:  Is insignificant for coronary artery disease in immediate  relatives.   REVIEW OF SYSTEMS:  Please see HPI.  He denies any fevers, chills,  headaches, sore throat, rash, syncope, presyncope, dyspnea on exertion,  orthopnea, nocturnal dyspnea, edema, dysuria, hematuria, numbness,  nausea,  vomiting, diarrhea, bright red blood per rectum, melena or skin changes.  He  has had some waterbrash symptoms recently.  He has also had some arthralgias  and myalgias recently secondary to his degenerative joint disease.  The  remainder of the review of systems is negative.   PHYSICAL EXAMINATION:  GENERAL:  He is a well-nourished, well developed male  in no acute distress.  VITAL SIGNS:  Blood pressure is 104/64, pulse 68, respirations 19, oxygen  saturation 95% on room air, temperature 98.2.  HEENT:  Head is normocephalic, atraumatic.  Eyes:  PERRLA, EOMI.  Sclerae  clear.  Oropharynx pink without exudate.  NECK:  Without lymphadenopathy or JVD.  Endocrine without thyromegaly.  Vascular without bruits bilaterally.  EXTREMITIES:  Femoral artery pulses are 2+ bilaterally without bruits.  LYMPHS:  No lymphadenopathy.  CARDIAC:  Normal S1, S2.  Regular rate and rhythm.  No murmurs.  LUNGS:  Clear to auscultation bilaterally.  ABDOMEN:  Soft, nontender, without hepatomegaly.  EXTREMITIES:  Without clubbing, cyanosis or edema.  MUSCULOSKELETAL:  Without  spine or CVA tenderness.  NEUROLOGIC:  Nonfocal.  Alert and oriented times three.   CHEST X-RAY:  No acute disease.   EKG:  Reveals normal sinus rhythm.  Heart rate of 70.  Left axis deviation.  No ischemic changes and voltage criteria consistent with LVH.   LABORATORY DATA:  Hemoglobin 16, hematocrit 47, sodium 140, potassium 3.6,  chloride 102, BUN 19, creatinine 1.5, glucose 120.  Cardiac enzymes negative  times two in the emergency room.   IMPRESSION:  1. Unstable angina pectoris.  2. Coronary artery disease.     A. Status post PTI in 2000 at [facility] in [place], [place].  3. Hypertension.  4. Hypercholesterolemia, treated.  5. Borderline diabetes mellitus.  6. Degenerative joint disease.  7. Degenerative disk disease.  8. History of shellfish allergy.   PLAN:  The patient was also seen and examined by Dr. Vicenta Aly today.  He  formulated the following plan to admit the patient and treat him with  aspirin, heparin, nitroglycerin, and beta blocker.  Will check serial  cardiac enzymes.  Will plan cardiac catheterization tomorrow.  Will  premedicate the patient with prednisone secondary to his shellfish allergy.  Will check CBGs given his history of borderline diabetes mellitus.  If his  sugars are significantly elevated  will start him on sliding scale insulin.  Will continue his Soma for his  back discomfort to be taken as needed.  Will add Protonix 40 mg a day to  cover for GI prophylaxis.  Will check fasting lipids to follow up on his  hypercholesterolemia.  Will also check a D-Dimer for possible pulmonary  embolism.      Richardson Dopp, P.A.                        Junious Silk, M.D. [facility]    SW/MEDQ  D:  02/12/2004  T:  02/13/2004  Job:  [id number]   cc:   Loma Newton, Dr.  [facility]

## 2011-04-09 NOTE — Discharge Summary (Signed)
NAMEJAMINE, [name] NO.:  [id number]   MEDICAL RECORD NO.:  [id number]                   PATIENT TYPE:  INP   LOCATION:  [id number]                                 FACILITY:  [facility]   PHYSICIAN:  Junious Silk, M.D. [facility]         DATE OF BIRTH:  September 27, 1951   DATE OF ADMISSION:  02/12/2004  DATE OF DISCHARGE:  02/14/2004                                 DISCHARGE SUMMARY   DIAGNOSES:  1. Coronary artery disease. Status post Cypher stenting to the first obtuse     marginal secondary to in-stent restenosis.  2. Hypertension.  3. Treated hypercholesterolemia.  4. Borderline diabetes mellitus.  5. Degenerative disk disease.  6. Shellfish allergy.  7. Leukocytosis.   PROCEDURE:  Cardiac catheterization and percutaneous coronary intervention  by Dr. Sabino Snipes on February 13, 2004. Please see the dictated note for  complete details. EF 60% without regional wall motion abnormalities. Obtuse  marginal #1 with 70% in-stent restenosis, reduced to 0% with Cypher  stenting.   HOSPITAL COURSE:  Please see the dictated admission history and physical for  complete details. Briefly, this is a 60 year old male with a history of  coronary artery disease status post stenting in 2000 in [facility]  in [place], [place], who presented to the emergency room at [facility]  with complaints of chest discomfort since 1:00 in the afternoon on February 12, 2004. He had radiation to his left scapula and left arm, shortness of  breath, and diaphoresis. This was reminiscent of his prior anginal symptoms  occurring before his intervention in 2000. He was admitted and placed on  aspirin, heparin, nitroglycerin, beta blockers. Serial enzymes were negative  for myocardial infarction. He was taken for cardiac catheterization on February 13, 2004. The results are noted above. He underwent successful Cypher stent  placement to the first obtuse marginal to reduce in-stent  restenosis. He  tolerated the procedure well and had no immediate complications. On the  morning of February 14, 2004, he was in stable condition. His right groin was  hematomas or bruits. He did receive prednisone prior to his catheterization  secondary to history of shellfish allergy. His white count was elevated on  the morning of February 14, 2004 at 20,400. This will be rechecked when he  comes back to the office in two weeks. He will be discharged to home on all  of his previous medications with the addition of Plavix 75 mg daily for the  next year and increased dose of aspirin at 325 mg a day and Toprol-XL 50 mg  a day. He will need to see Dr. Albertine Patricia in two weeks.   LABORATORY DATA:  Admission chest x-ray:  No acute disease. White count as  noted above, 20,400 at discharge; 6,400 on admission. Hemoglobin 14.8,  hematocrit 42.9, platelet count 274,000. Sodium 137, potassium 3.8, chloride  99, CO2 31, glucose 228, BUN 14, creatinine 1.4, calcium 9.2.  Cardiac  enzymes negative. Total cholesterol 113, triglycerides 90, HDL 44, LDL 51.  Chest x-ray as noted above.   DISCHARGE MEDICATIONS:  1. Plavix 75 mg daily for one year.  2. Enteric-coated aspirin 325 mg daily.  3. Toprol-XL 50 mg a day.  4. Avalide 12.5/300 mg a day.  5. Zocor 80 mg q.h.s.  6. Soma p.r.n.  7. Nitroglycerin p.r.n. chest pain.   PAIN MANAGEMENT:  Tylenol as needed. Nitroglycerin as needed for chest pain.  He is to call 911 or our office in [place] for recurrent chest pain.   ACTIVITY:  No driving for the next three days. No heavy lifting, work, or  exertion for the next week.   DIET:  Low fat, low sodium, diabetic diet.   WOUND CARE:  The patient is to call our office for any groin swelling,  bleeding, or bruising.   FOLLOW UP:  Dr. Albertine Patricia in two weeks in our office. We will contact him with  an appointment. A CBC will be checked at his followup appointment to follow  up on his leukocytosis which is probably  secondary to his prednisone therapy  prior to his catheterization.      Richardson Dopp, P.A.                        Junious Silk, M.D. [facility]    SW/MEDQ  D:  02/14/2004  T:  02/14/2004  Job:  [id number]   cc:   Jabier Mutton, M.D.     Ethelle Lyon, M.D.  [address] [place]  [place] [address]

## 2011-04-09 NOTE — Cardiovascular Report (Signed)
[name], [name] NO.:  [id number]   MEDICAL RECORD NO.:  [id number]                   PATIENT TYPE:  INP   LOCATION:  2009                                 FACILITY:  [facility]   PHYSICIAN:  Ethelle Lyon, M.D.             DATE OF BIRTH:  23-Jun-1951   DATE OF PROCEDURE:  02/13/2004  DATE OF DISCHARGE:                              CARDIAC CATHETERIZATION   PROCEDURE:  Left heart catheterization, left ventriculography, coronary  angiography, and drug-eluting stent placement in the first obtuse marginal.   INDICATIONS:  [name] is a 60 year old gentleman status post bare metal  stenting of his obtuse marginal performed at [facility] in [place],  [place], in March 2001.  Over the past year he has had chest discomfort  occurring both with exertion and at rest.  Recent exercise tolerance test  demonstrated inferolateral ischemia.  Plan was initially for medical  therapy; however, yesterday he had 10 hours of chest discomfort.  He ruled  out for myocardial infarction.  He was scheduled for cardiac catheterization  today.   PROCEDURAL TECHNIQUE:  Informed consent was obtained.  Under 1% lidocaine  local anesthesia, a 6 French sheath was placed in the right common femoral  artery using the modified Seldinger technique.  Diagnostic angiography and  ventriculography were performed using JL4, JR4, and pigtail catheters.  Images demonstrated 70% in-stent restenosis of the stent in the obtuse  marginal.  A plan was therefore made for intervention.   Anticoagulation was initiated with heparin to achieve and maintain an ACT of  greater than 275 seconds.  A CLS 3.5 guide was advanced over a wire and  engaged in the ostium of the left main.  A Luge wire was advanced beyond the  lesion without difficulty.  The lesion was directly stented using a 3.0 x 23  mm Cypher at 18 atmospheres.  The midportion of the stent was then post-  dilated using a 3.0 x  18 mm PowerSail at 20 atmospheres.  Final angiograms  demonstrated no residual stenosis, no dissection, and TIMI-3 flow to the  distal vasculature.  The patient tolerated the procedure well and was  transferred to the holding room.   COMPLICATIONS:  None.   FINDINGS:  1. LV:  105/73/88.  EF 60% without regional wall motion abnormality.  2. No aortic stenosis or mitral regurgitation.  3. Left main:  Angiographically normal.  4. LAD:  Large vessel giving rise to a single large diagonal branch.  It is     angiographically normal.  5. Circumflex:  A moderate-sized vessel giving rise to a single large obtuse     marginal.  The previously-placed stent within the proximal portion of     this marginal has 70% in-stent restenosis.  It was treated with drug-     eluting stent placement resulting in no residual stenosis.  6. RCA:  Moderate-sized, dominant vessel which is angiographically normal.  IMPRESSION/RECOMMENDATION:  Successful drug-eluting stenting of the in-stent  restenosis of first obtuse marginal.  Will plan on Plavix for one year and  aspirin indefinitely.                                               Ethelle Lyon, M.D.   WED/MEDQ  D:  02/13/2004  T:  02/14/2004  Job:  [id number]

## 2011-04-15 ENCOUNTER — Encounter: Payer: Self-pay | Admitting: Internal Medicine

## 2011-04-15 ENCOUNTER — Ambulatory Visit (INDEPENDENT_AMBULATORY_CARE_PROVIDER_SITE_OTHER): Payer: Medicare Other | Admitting: Internal Medicine

## 2011-04-15 VITALS — BP 162/100 | HR 90 | Temp 98.4°F | Ht 71.0 in | Wt 209.4 lb

## 2011-04-15 DIAGNOSIS — J019 Acute sinusitis, unspecified: Secondary | ICD-10-CM

## 2011-04-15 DIAGNOSIS — Z0000 Encounter for general adult medical examination without abnormal findings: Secondary | ICD-10-CM

## 2011-04-15 DIAGNOSIS — I10 Essential (primary) hypertension: Secondary | ICD-10-CM

## 2011-04-15 MED ORDER — PRAVASTATIN SODIUM 20 MG PO TABS: 20.0000 mg | ORAL_TABLET | Freq: Every evening | ORAL | Status: DC

## 2011-04-15 MED ORDER — LEVOFLOXACIN 500 MG PO TABS: 500.0000 mg | ORAL_TABLET | Freq: Every day | ORAL | Status: AC

## 2011-04-15 MED ORDER — LISINOPRIL-HYDROCHLOROTHIAZIDE 20-12.5 MG PO TABS: 1.0000 | ORAL_TABLET | Freq: Every day | ORAL | Status: DC

## 2011-04-15 MED ORDER — HYDROCODONE-HOMATROPINE 5-1.5 MG/5ML PO SYRP: 5.0000 mL | ORAL_SOLUTION | Freq: Four times a day (QID) | ORAL | Status: AC | PRN

## 2011-04-15 NOTE — Patient Instructions (Addendum)
Take all new medications as prescribed Continue all other medications as before, including re-starting the blood pressure and cholesterol medication please Please return in 3 mo with Lab testing done 3-5 days before, for Physical with Dr Asa Lente

## 2011-04-19 ENCOUNTER — Encounter: Payer: Self-pay | Admitting: Internal Medicine

## 2011-04-19 NOTE — Progress Notes (Signed)
  Subjective:    Patient ID: [name], male    DOB: 03-12-1951, 60 y.o.   MRN: [id number]  HPI   Here with 3 wks acute onset fever, facial pain, pressure, general weakness and malaise, and greenish d/c, with slight ST, but little to no cough and Pt denies chest pain, increased sob or doe, wheezing, orthopnea, PND, increased LE swelling, palpitations, dizziness or syncope.   Pt denies chest pain, increased sob or doe, wheezing, orthopnea, PND, increased LE swelling, palpitations, dizziness or syncope.  Pt denies new neurological symptoms such as new headache, or facial or extremity weakness or numbness   Pt denies polydipsia, polyuria, or low sugar symptoms such as weakness or confusion improved with po intake.  Pt states overall good compliance with meds, trying to follow lower cholesterol, diabetic diet, wt overall stable but little exercise however.    Past Medical History  Diagnosis Date  . Acute sinusitis, unspecified 10/26/2010  . ALLERGIC RHINITIS 04/13/2010  . CAD, NATIVE VESSEL 03/12/2009  . CHEST PAIN-UNSPECIFIED 03/12/2009  . DIAB W/UNSPEC COMP TYPE II/UNSPEC TYPE UNCNTRL 02/26/2010  . DIABETES MELLITUS, TYPE II, UNCONTROLLED 04/13/2010  . ERECTILE DYSFUNCTION 03/12/2009  . GERD 04/13/2010  . Headache 04/13/2010  . HYPERLIPIDEMIA-MIXED 03/07/2009  . HYPERTENSION, BENIGN 03/12/2009  . [place], RIGHT 05/29/2010  . OTITIS MEDIA, ACUTE, RIGHT 05/29/2010  . SHOULDER PAIN, RIGHT 04/13/2010  . TOBACCO USE, QUIT 04/13/2010   Past Surgical History  Procedure Date  . Stent 2001  . Angioplasty   . Left knee surgury     reports that he has never smoked. He does not have any smokeless tobacco history on file. His alcohol and drug histories not on file. family history includes Cancer in his other. Allergies  Allergen Reactions  . Penicillins    No current outpatient prescriptions on file prior to visit.   Review of Systems All otherwise neg per pt     Objective:   Physical  Exam BP 162/100  Pulse 90  Temp(Src) 98.4 F (36.9 C) (Oral)  Ht 5\' 11"  (1.803 m)  Wt 209 lb 6 oz (94.972 kg)  BMI 29.20 kg/m2  SpO2 97% Physical Exam  VS noted, mild ill  Constitutional: Pt appears well-developed and well-nourished.  HENT: Head: Normocephalic.  Right Ear: External ear normal.  Left Ear: External ear normal.  Bilat tm's mild erythema.  Sinus tender bilat.  Pharynx mild erythema Eyes: Conjunctivae and EOM are normal. Pupils are equal, round, and reactive to light.  Neck: Normal range of motion. Neck supple.  Cardiovascular: Normal rate and regular rhythm.   Pulmonary/Chest: Effort normal and breath sounds normal.  Abd:  Soft, NT, non-distended, + BS Neurological: Pt is alert. No cranial nerve deficit.  Skin: Skin is warm. No erythema.  Psychiatric: Pt behavior is normal. Thought content normal.         Assessment & Plan:

## 2011-04-19 NOTE — Assessment & Plan Note (Signed)
stable overall by hx and exam - mild elev today likely due to acute illness, most recent lab reviewed with pt, and pt to continue medical treatment as before  BP Readings from Last 3 Encounters:  04/15/11 162/100  01/29/11 142/108  10/29/10 138/98

## 2011-04-27 ENCOUNTER — Encounter: Payer: Self-pay | Admitting: Endocrinology

## 2011-04-27 ENCOUNTER — Ambulatory Visit (INDEPENDENT_AMBULATORY_CARE_PROVIDER_SITE_OTHER): Payer: Medicare Other | Admitting: Endocrinology

## 2011-04-27 ENCOUNTER — Other Ambulatory Visit (INDEPENDENT_AMBULATORY_CARE_PROVIDER_SITE_OTHER): Payer: Medicare Other

## 2011-04-27 VITALS — BP 142/98 | HR 88 | Temp 98.6°F | Ht 71.0 in | Wt 209.2 lb

## 2011-04-27 DIAGNOSIS — IMO0001 Reserved for inherently not codable concepts without codable children: Secondary | ICD-10-CM

## 2011-04-27 LAB — HEMOGLOBIN A1C: Hgb A1c MFr Bld: 9.9 % — ABNORMAL HIGH (ref 4.6–6.5)

## 2011-04-27 MED ORDER — HYDROCODONE-HOMATROPINE 5-1.5 MG PO TABS: 1.0000 | ORAL_TABLET | Freq: Four times a day (QID) | ORAL | Status: DC | PRN

## 2011-04-27 NOTE — Patient Instructions (Addendum)
blood tests are being ordered for you today.  please call [phone] to hear your test results.  You will be prompted to enter the 9-digit "MRN" number that appears at the top left of this page, followed by #.  Then you will hear the message. pending the test results, please continue the same medications for now. Please make a follow-up appointment in 3 months. good diet and exercise habits significanly improve the control of your diabetes.  please let me know if you wish to be referred to a dietician.  high blood sugar is very risky to your health.  you should see an eye doctor every year.  controlling your blood pressure and cholesterol drastically reduces the damage diabetes does to your body.  this also applies to quitting smoking.  please discuss these with your doctor.  you should take an aspirin every day, unless you have been advised by a doctor not to.   Here is a refill on your cough medicine. I hope you feel better soon.  If you don't feel better by next week, please call your doctor. (update: i left message on phone-tree:  i advised insulin.

## 2011-04-27 NOTE — Progress Notes (Signed)
Subjective:    Patient ID: [name], male    DOB: [birth year], 60 y.o.   MRN: [id number]  HPI The state of at least three ongoing medical problems is addressed today: Pt returns to f/u type 2 dm.  no cbg record, but states cbg's are well-controlled.   He feels much better, since except pneumonia episode.  However, he still has cough and right ear congestion.  He says he does not have a cough in general.  He takes metformin only 2/day, due to nausea. Past Medical History  Diagnosis Date  . Acute sinusitis, unspecified 10/26/2010  . ALLERGIC RHINITIS 04/13/2010  . CAD, NATIVE VESSEL 03/12/2009  . CHEST PAIN-UNSPECIFIED 03/12/2009  . DIAB W/UNSPEC COMP TYPE II/UNSPEC TYPE UNCNTRL 02/26/2010  . DIABETES MELLITUS, TYPE II, UNCONTROLLED 04/13/2010  . ERECTILE DYSFUNCTION 03/12/2009  . GERD 04/13/2010  . Headache 04/13/2010  . HYPERLIPIDEMIA-MIXED 03/07/2009  . HYPERTENSION, BENIGN 03/12/2009  . [place], RIGHT 05/29/2010  . OTITIS MEDIA, ACUTE, RIGHT 05/29/2010  . SHOULDER PAIN, RIGHT 04/13/2010  . TOBACCO USE, QUIT 04/13/2010    Past Surgical History  Procedure Date  . Stent 2001  . Angioplasty   . Left knee surgury     History   Social History  . Marital Status: Legally Separated    Spouse Name: N/A    Number of Children: N/A  . Years of Education: N/A   Occupational History  . Not on file.   Social History Main Topics  . Smoking status: Never Smoker   . Smokeless tobacco: Not on file  . Alcohol Use: Not on file  . Drug Use: Not on file  . Sexually Active: Not on file   Other Topics Concern  . Not on file   Social History Narrative  . No narrative on file    Current Outpatient Prescriptions on File Prior to Visit  Medication Sig Dispense Refill  . aspirin 325 MG tablet Take 325 mg by mouth daily.        . bromocriptine (PARLODEL) 2.5 MG tablet Take 2.5 mg by mouth at bedtime.        . carisoprodol (SOMA) 350 MG tablet Take 350 mg by mouth 3 (three) times  daily as needed.        . carvedilol (COREG) 6.25 MG tablet Take 6.25 mg by mouth 2 (two) times daily.        Marland Kitchen glipiZIDE (GLUCOTROL) 5 MG 24 hr tablet Take 5 mg by mouth 2 (two) times daily.        Marland Kitchen lisinopril-hydrochlorothiazide (PRINZIDE,ZESTORETIC) 20-12.5 MG per tablet Take 1 tablet by mouth daily.        Marland Kitchen loratadine (CLARITIN) 10 MG tablet Take 10 mg by mouth daily.        . metFORMIN (GLUCOPHAGE-XR) 500 MG 24 hr tablet Take 500 mg by mouth 2 (two) times daily.       . nitroGLYCERIN (NITROSTAT) 0.4 MG SL tablet 1 tablet under tongue at onset of chest pain; you may repeat every 5 minutes for up to 3 doses.       . pravastatin (PRAVACHOL) 20 MG tablet Take 20 mg by mouth at bedtime.        . sitaGLIPtan (JANUVIA) 100 MG tablet Take 100 mg by mouth daily.          Allergies  Allergen Reactions  . Penicillins     Family History  Problem Relation Age of Onset  . Cancer Other     colon  cancer 1st degree relative <60 and prostate cancer     BP 142/98  Pulse 88  Temp(Src) 98.6 F (37 C) (Oral)  Ht 5\' 11"  (1.803 m)  Wt 209 lb 3.2 oz (94.892 kg)  BMI 29.18 kg/m2  SpO2 98% Review of Systems denies hypoglycemia, fever, and sob    Objective:   Physical Exam Pulses: dorsalis pedis intact bilat.   Feet: no deformity.  no ulcer on the feet.  feet are of normal color and temp.  no edema Neuro: sensation is intact to touch on the feet. head: no deformity eyes: no periorbital swelling, no proptosis external nose and ears are normal mouth: no lesion seen Both eac's and tm's are normal LUNGS:  Clear to auscultation    Lab Results  Component Value Date   HGBA1C 9.9* 04/27/2011   Assessment & Plan:  Dm.  He will need insulin to achieve good control Nausea.  This limits rx options of dm. Persistent cough after pneumonia

## 2011-04-30 ENCOUNTER — Ambulatory Visit: Payer: PRIVATE HEALTH INSURANCE | Admitting: Endocrinology

## 2011-07-12 ENCOUNTER — Encounter: Payer: Self-pay | Admitting: Internal Medicine

## 2011-07-21 ENCOUNTER — Encounter: Payer: Medicare Other | Admitting: Internal Medicine

## 2011-07-21 ENCOUNTER — Ambulatory Visit: Payer: Medicare Other | Admitting: Endocrinology

## 2011-07-21 DIAGNOSIS — Z029 Encounter for administrative examinations, unspecified: Secondary | ICD-10-CM

## 2011-08-26 LAB — ANA: Anti Nuclear Antibody(ANA): NEGATIVE

## 2011-08-26 LAB — DIFFERENTIAL
Basophils Absolute: 0 10*3/uL (ref 0.0–0.1)
Basophils Absolute: 0.1 10*3/uL (ref 0.0–0.1)
Basophils Relative: 1 % (ref 0–1)
Basophils Relative: 1 % (ref 0–1)
Eosinophils Absolute: 0.3 10*3/uL (ref 0.0–0.7)
Eosinophils Absolute: 0.3 10*3/uL (ref 0.0–0.7)
Eosinophils Relative: 5 % (ref 0–5)
Eosinophils Relative: 5 % (ref 0–5)
Lymphocytes Relative: 39 % (ref 12–46)
Lymphocytes Relative: 45 % (ref 12–46)
Lymphs Abs: 2.1 10*3/uL (ref 0.7–4.0)
Lymphs Abs: 3.2 10*3/uL (ref 0.7–4.0)
Monocytes Absolute: 0.5 10*3/uL (ref 0.1–1.0)
Monocytes Absolute: 0.7 10*3/uL (ref 0.1–1.0)
Monocytes Relative: 10 % (ref 3–12)
Monocytes Relative: 9 % (ref 3–12)
Neutro Abs: 2.4 10*3/uL (ref 1.7–7.7)
Neutro Abs: 2.9 10*3/uL (ref 1.7–7.7)
Neutrophils Relative %: 40 % — ABNORMAL LOW (ref 43–77)
Neutrophils Relative %: 45 % (ref 43–77)

## 2011-08-26 LAB — CBC
HCT: 42.5 % (ref 39.0–52.0)
HCT: 43.9 % (ref 39.0–52.0)
Hemoglobin: 14.3 g/dL (ref 13.0–17.0)
Hemoglobin: 14.4 g/dL (ref 13.0–17.0)
MCHC: 32.8 g/dL (ref 30.0–36.0)
MCHC: 33.5 g/dL (ref 30.0–36.0)
MCV: 91.4 fL (ref 78.0–100.0)
MCV: 92.3 fL (ref 78.0–100.0)
Platelets: 185 10*3/uL (ref 150–400)
Platelets: 208 10*3/uL (ref 150–400)
RBC: 4.65 MIL/uL (ref 4.22–5.81)
RBC: 4.76 MIL/uL (ref 4.22–5.81)
RDW: 12.5 % (ref 11.5–15.5)
RDW: 12.9 % (ref 11.5–15.5)
WBC: 5.4 10*3/uL (ref 4.0–10.5)
WBC: 7.2 10*3/uL (ref 4.0–10.5)

## 2011-08-26 LAB — URINE MICROSCOPIC-ADD ON

## 2011-08-26 LAB — COMPREHENSIVE METABOLIC PANEL
ALT: 20 U/L (ref 0–53)
AST: 25 U/L (ref 0–37)
Albumin: 3.6 g/dL (ref 3.5–5.2)
Alkaline Phosphatase: 86 U/L (ref 39–117)
BUN: 9 mg/dL (ref 6–23)
CO2: 31 mEq/L (ref 19–32)
Calcium: 8.9 mg/dL (ref 8.4–10.5)
Chloride: 102 mEq/L (ref 96–112)
Creatinine, Ser: 1.02 mg/dL (ref 0.4–1.5)
GFR calc Af Amer: >60 mL/min (ref >60)
GFR calc non Af Amer: >60 mL/min (ref >60)
Glucose, Bld: 245 mg/dL — ABNORMAL HIGH (ref 70–99)
Potassium: 4.3 mEq/L (ref 3.5–5.1)
Sodium: 137 mEq/L (ref 135–145)
Total Bilirubin: 1.3 mg/dL — ABNORMAL HIGH (ref 0.3–1.2)
Total Protein: 6.9 g/dL (ref 6.0–8.3)

## 2011-08-26 LAB — GLUCOSE, CAPILLARY
Glucose-Capillary: 154 mg/dL — ABNORMAL HIGH (ref 70–99)
Glucose-Capillary: 233 mg/dL — ABNORMAL HIGH (ref 70–99)
Glucose-Capillary: 235 mg/dL — ABNORMAL HIGH (ref 70–99)
Glucose-Capillary: 263 mg/dL — ABNORMAL HIGH (ref 70–99)
Glucose-Capillary: 272 mg/dL — ABNORMAL HIGH (ref 70–99)
Glucose-Capillary: 285 mg/dL — ABNORMAL HIGH (ref 70–99)
Glucose-Capillary: 297 mg/dL — ABNORMAL HIGH (ref 70–99)
Glucose-Capillary: 329 mg/dL — ABNORMAL HIGH (ref 70–99)

## 2011-08-26 LAB — BASIC METABOLIC PANEL
BUN: 13 mg/dL (ref 6–23)
BUN: 9 mg/dL (ref 6–23)
CO2: 26 mEq/L (ref 19–32)
CO2: 28 mEq/L (ref 19–32)
Calcium: 8.7 mg/dL (ref 8.4–10.5)
Calcium: 9 mg/dL (ref 8.4–10.5)
Chloride: 100 mEq/L (ref 96–112)
Chloride: 102 mEq/L (ref 96–112)
Creatinine, Ser: 0.97 mg/dL (ref 0.4–1.5)
Creatinine, Ser: 1.06 mg/dL (ref 0.4–1.5)
GFR calc Af Amer: >60 mL/min (ref >60)
GFR calc Af Amer: >60 mL/min (ref >60)
GFR calc non Af Amer: >60 mL/min (ref >60)
GFR calc non Af Amer: >60 mL/min (ref >60)
Glucose, Bld: 287 mg/dL — ABNORMAL HIGH (ref 70–99)
Glucose, Bld: 387 mg/dL — ABNORMAL HIGH (ref 70–99)
Potassium: 3.8 mEq/L (ref 3.5–5.1)
Potassium: 3.9 mEq/L (ref 3.5–5.1)
Sodium: 134 mEq/L — ABNORMAL LOW (ref 135–145)
Sodium: 136 mEq/L (ref 135–145)

## 2011-08-26 LAB — HOMOCYSTEINE: Homocysteine: 8.4 umol/L (ref 4.0–15.4)

## 2011-08-26 LAB — URINALYSIS, ROUTINE W REFLEX MICROSCOPIC
Bilirubin Urine: NEGATIVE
Glucose, UA: >1000 mg/dL — AB
Hgb urine dipstick: NEGATIVE
Ketones, ur: NEGATIVE mg/dL
Leukocytes, UA: NEGATIVE
Nitrite: NEGATIVE
Protein, ur: NEGATIVE mg/dL
Specific Gravity, Urine: 1.041 — ABNORMAL HIGH (ref 1.005–1.030)
Urobilinogen, UA: 0.2 mg/dL (ref 0.0–1.0)
pH: 5.5 (ref 5.0–8.0)

## 2011-08-26 LAB — POCT CARDIAC MARKERS
CKMB, poc: <1 ng/mL — ABNORMAL LOW (ref 1.0–8.0)
Myoglobin, poc: 63.4 ng/mL (ref 12–200)
Troponin i, poc: <0.05 ng/mL (ref 0.00–0.09)

## 2011-08-26 LAB — LIPID PANEL
Cholesterol: 174 mg/dL (ref 0–200)
HDL: 46 mg/dL (ref >39)
LDL Cholesterol: 102 mg/dL — ABNORMAL HIGH (ref 0–99)
Total CHOL/HDL Ratio: 3.8 RATIO
Triglycerides: 132 mg/dL (ref <150)
VLDL: 26 mg/dL (ref 0–40)

## 2011-08-26 LAB — APTT
aPTT: 30 seconds (ref 24–37)
aPTT: 31 seconds (ref 24–37)

## 2011-08-26 LAB — PROTIME-INR
INR: 1 (ref 0.00–1.49)
INR: 1 (ref 0.00–1.49)
Prothrombin Time: 13.1 seconds (ref 11.6–15.2)
Prothrombin Time: 13.8 seconds (ref 11.6–15.2)

## 2011-08-26 LAB — HEMOGLOBIN A1C
Hgb A1c MFr Bld: 12.5 % — ABNORMAL HIGH (ref 4.6–6.1)
Mean Plasma Glucose: 312 mg/dL

## 2011-08-26 LAB — B-NATRIURETIC PEPTIDE (CONVERTED LAB): Pro B Natriuretic peptide (BNP): <30 pg/mL (ref 0.0–100.0)

## 2011-08-26 LAB — CARDIAC PANEL(CRET KIN+CKTOT+MB+TROPI)
CK, MB: 1 ng/mL (ref 0.3–4.0)
Relative Index: 0.6 (ref 0.0–2.5)
Total CK: 179 U/L (ref 7–232)
Troponin I: 0.01 ng/mL (ref 0.00–0.06)

## 2011-09-17 ENCOUNTER — Other Ambulatory Visit: Payer: Self-pay | Admitting: *Deleted

## 2011-09-17 MED ORDER — METFORMIN HCL ER 500 MG PO TB24: 1000.0000 mg | ORAL_TABLET | Freq: Two times a day (BID) | ORAL | Status: DC

## 2011-09-17 NOTE — Telephone Encounter (Signed)
[facility] RN called while on home visit with pt to inform that pt will run out of Metformin soon. Rx to be sent to [facility]. [facility] RN informed rx sent to pharmacy for pt.

## 2011-10-13 ENCOUNTER — Telehealth: Payer: Self-pay | Admitting: *Deleted

## 2011-10-13 NOTE — Telephone Encounter (Signed)
Pt states that he will callback and schedule an appointment in January when his copay will change.

## 2011-10-13 NOTE — Telephone Encounter (Signed)
Per MD pt is due for F/U OV (last OV 04/2011 F/U in 3 months). Left message for pt to callback office.

## 2011-11-25 ENCOUNTER — Ambulatory Visit: Payer: Medicare Other | Admitting: Endocrinology

## 2011-11-30 ENCOUNTER — Other Ambulatory Visit (INDEPENDENT_AMBULATORY_CARE_PROVIDER_SITE_OTHER): Payer: Medicare Other

## 2011-11-30 ENCOUNTER — Ambulatory Visit (INDEPENDENT_AMBULATORY_CARE_PROVIDER_SITE_OTHER): Payer: Medicare Other | Admitting: Endocrinology

## 2011-11-30 ENCOUNTER — Encounter: Payer: Self-pay | Admitting: Endocrinology

## 2011-11-30 VITALS — BP 152/102 | HR 96 | Temp 98.6°F | Ht 71.0 in | Wt 201.8 lb

## 2011-11-30 DIAGNOSIS — IMO0001 Reserved for inherently not codable concepts without codable children: Secondary | ICD-10-CM

## 2011-11-30 DIAGNOSIS — Z23 Encounter for immunization: Secondary | ICD-10-CM

## 2011-11-30 LAB — HEMOGLOBIN A1C: Hgb A1c MFr Bld: 10.2 % — ABNORMAL HIGH (ref 4.6–6.5)

## 2011-11-30 MED ORDER — INSULIN ASPART 100 UNIT/ML ~~LOC~~ SOLN: 10.0000 [IU] | Freq: Three times a day (TID) | SUBCUTANEOUS | Status: DC

## 2011-11-30 NOTE — Patient Instructions (Addendum)
blood tests are being ordered for you today.  please call [phone] to hear your test results.  You will be prompted to enter the 9-digit "MRN" number that appears at the top left of this page, followed by #.  Then you will hear the message. pending the test results, please continue the same medications for now. Please make a follow-up appointment in 3 months. good diet and exercise habits significanly improve the control of your diabetes.  please let me know if you wish to be referred to a dietician.  high blood sugar is very risky to your health.  you should see an eye doctor every year.  controlling your blood pressure and cholesterol drastically reduces the damage diabetes does to your body.  this also applies to quitting smoking.  please discuss these with your doctor.  you should take an aspirin every day, unless you have been advised by a doctor not to.   Start novolog pen 10 units 3x a day (just before each meal). Please come back for a follow-up appointment in 2 weeks.

## 2011-11-30 NOTE — Progress Notes (Signed)
Subjective:    Patient ID: [name], male    DOB: [birth year], 61 y.o.   MRN: [id number]  HPI Pt returns for f/u of type 2 DM (2006).  no cbg record, but states cbg's are "high."  He has few mos of slight pain at the toes, and assoc numbness.   Past Medical History  Diagnosis Date  . Acute sinusitis, unspecified 10/26/2010  . ALLERGIC RHINITIS 04/13/2010  . CAD, NATIVE VESSEL 03/12/2009  . CHEST PAIN-UNSPECIFIED 03/12/2009  . DIAB W/UNSPEC COMP TYPE II/UNSPEC TYPE UNCNTRL 02/26/2010  . DIABETES MELLITUS, TYPE II, UNCONTROLLED 04/13/2010  . ERECTILE DYSFUNCTION 03/12/2009  . GERD 04/13/2010  . Headache 04/13/2010  . HYPERLIPIDEMIA-MIXED 03/07/2009  . HYPERTENSION, BENIGN 03/12/2009  . [place], RIGHT 05/29/2010  . OTITIS MEDIA, ACUTE, RIGHT 05/29/2010  . SHOULDER PAIN, RIGHT 04/13/2010  . TOBACCO USE, QUIT 04/13/2010    Past Surgical History  Procedure Date  . Stent 2001  . Angioplasty   . Left knee surgury     History   Social History  . Marital Status: Legally Separated    Spouse Name: N/A    Number of Children: N/A  . Years of Education: N/A   Occupational History  . Not on file.   Social History Main Topics  . Smoking status: Former Smoker    Quit date: 02/23/1993  . Smokeless tobacco: Not on file   Comment: Married, livesin [place] with wife. He is disable secondary to back pain. 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 Alcohol Use: No  . Drug Use: No  . Sexually Active: Not on file   Other Topics Concern  . Not on file   Social History Narrative  . No narrative on file    Current Outpatient Prescriptions on File Prior to Visit  Medication Sig Dispense Refill  . aspirin 325 MG tablet Take 325 mg by mouth daily.        . bromocriptine (PARLODEL) 2.5 MG tablet Take 2.5 mg by mouth at bedtime.        . carisoprodol (SOMA) 350 MG tablet Take 350 mg by mouth 3 (three) times daily as needed.        . carvedilol (COREG) 6.25 MG tablet Take 6.25 mg by mouth  2 (two) times daily.        Marland Kitchen glipiZIDE (GLUCOTROL) 5 MG 24 hr tablet Take 5 mg by mouth 2 (two) times daily.        Marland Kitchen lisinopril-hydrochlorothiazide (PRINZIDE,ZESTORETIC) 20-12.5 MG per tablet Take 1 tablet by mouth daily.        Marland Kitchen loratadine (CLARITIN) 10 MG tablet Take 10 mg by mouth daily.        . metFORMIN (GLUCOPHAGE-XR) 500 MG 24 hr tablet Take 2 tablets (1,000 mg total) by mouth 2 (two) times daily.  360 tablet  1  . nitroGLYCERIN (NITROSTAT) 0.4 MG SL tablet 1 tablet under tongue at onset of chest pain; you may repeat every 5 minutes for up to 3 doses.       . pravastatin (PRAVACHOL) 20 MG tablet Take 20 mg by mouth at bedtime.        . sitaGLIPtan (JANUVIA) 100 MG tablet Take 100 mg by mouth daily.        . Hydrocodone-Homatropine 5-1.5 MG TABS Take 1 tablet by mouth every 6 (six) hours as needed. For cough  50 each  0    Allergies  Allergen Reactions  . Penicillins     Family  History  Problem Relation Age of Onset  . Cancer Other     colon cancer 1st degree relative <60 and prostate cancer     BP 152/102  Pulse 96  Temp(Src) 98.6 F (37 C) (Oral)  Ht 5\' 11"  (1.803 m)  Wt 201 lb 12.8 oz (91.536 kg)  BMI 28.15 kg/m2  SpO2 99%    Review of Systems Denies weight change and n/v.      Objective:   Physical Exam VITAL SIGNS:  See vs page GENERAL: no distress Pulses: dorsalis pedis intact bilat.   Feet: no deformity.  no ulcer on the feet.  feet are of normal color and temp.  no edema Neuro: sensation is intact to touch on the feet, but decreased from normal.  Lab Results  Component Value Date   HGBA1C 10.2* 11/30/2011      Assessment & Plan:  DM, needs increased rx.  He agrees today to start insulin--good

## 2011-12-14 ENCOUNTER — Ambulatory Visit (INDEPENDENT_AMBULATORY_CARE_PROVIDER_SITE_OTHER): Payer: Medicare Other | Admitting: Endocrinology

## 2011-12-14 ENCOUNTER — Encounter: Payer: Self-pay | Admitting: Endocrinology

## 2011-12-14 VITALS — BP 142/96 | HR 96 | Temp 98.4°F | Ht 71.0 in | Wt 211.4 lb

## 2011-12-14 DIAGNOSIS — IMO0001 Reserved for inherently not codable concepts without codable children: Secondary | ICD-10-CM

## 2011-12-14 DIAGNOSIS — R200 Anesthesia of skin: Secondary | ICD-10-CM

## 2011-12-14 MED ORDER — CARVEDILOL 12.5 MG PO TABS: 12.5000 mg | ORAL_TABLET | Freq: Two times a day (BID) | ORAL | Status: DC

## 2011-12-14 NOTE — Progress Notes (Signed)
Subjective:    Patient ID: [name], male    DOB: 04-Dec-1950, 61 y.o.   MRN: [id number]  HPI Pt returns for f/u of insulin-requiring DM (2007).  He was started on insulin 7 mos ago, but he did not start it until the a1c was high, 2 weeks ago.  no cbg record, but states cbg's are 90-160.  It is highest in am, as he sometimes eats a snack at hs.  Pt states few years of slight numbness of the toes, and assoc pain.   Past Medical History  Diagnosis Date  . Acute sinusitis, unspecified 10/26/2010  . ALLERGIC RHINITIS 04/13/2010  . CAD, NATIVE VESSEL 03/12/2009  . CHEST PAIN-UNSPECIFIED 03/12/2009  . DIAB W/UNSPEC COMP TYPE II/UNSPEC TYPE UNCNTRL 02/26/2010  . DIABETES MELLITUS, TYPE II, UNCONTROLLED 04/13/2010  . ERECTILE DYSFUNCTION 03/12/2009  . GERD 04/13/2010  . Headache 04/13/2010  . HYPERLIPIDEMIA-MIXED 03/07/2009  . HYPERTENSION, BENIGN 03/12/2009  . [place], RIGHT 05/29/2010  . OTITIS MEDIA, ACUTE, RIGHT 05/29/2010  . SHOULDER PAIN, RIGHT 04/13/2010  . TOBACCO USE, QUIT 04/13/2010    Past Surgical History  Procedure Date  . Stent 2001  . Angioplasty   . Left knee surgury     History   Social History  . Marital Status: Legally Separated    Spouse Name: N/A    Number of Children: N/A  . Years of Education: N/A   Occupational History  . Not on file.   Social History Main Topics  . Smoking status: Former Smoker    Quit date: 02/23/1993  . Smokeless tobacco: Not on file   Comment: Married, livesin [place] with wife. He is disable secondary to back pain. 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 Alcohol Use: No  . Drug Use: No  . Sexually Active: Not on file   Other Topics Concern  . Not on file   Social History Narrative  . No narrative on file    Current Outpatient Prescriptions on File Prior to Visit  Medication Sig Dispense Refill  . aspirin 325 MG tablet Take 325 mg by mouth daily.        . carisoprodol (SOMA) 350 MG tablet Take 350 mg by mouth 3  (three) times daily as needed.        . Hydrocodone-Homatropine 5-1.5 MG TABS Take 1 tablet by mouth every 6 (six) hours as needed. For cough  50 each  0  . insulin aspart (NOVOLOG FLEXPEN) 100 UNIT/ML injection Inject 10 Units into the skin 3 (three) times daily before meals. And pen needles 3/day  15 mL  12  . lisinopril-hydrochlorothiazide (PRINZIDE,ZESTORETIC) 20-12.5 MG per tablet Take 1 tablet by mouth daily.        Marland Kitchen loratadine (CLARITIN) 10 MG tablet Take 10 mg by mouth daily.        . nitroGLYCERIN (NITROSTAT) 0.4 MG SL tablet 1 tablet under tongue at onset of chest pain; you may repeat every 5 minutes for up to 3 doses.       . pravastatin (PRAVACHOL) 20 MG tablet Take 20 mg by mouth at bedtime.          Allergies  Allergen Reactions  . Penicillins     Family History  Problem Relation Age of Onset  . Cancer Other     colon cancer 1st degree relative <60 and prostate cancer     BP 142/96  Pulse 96  Temp(Src) 98.4 F (36.9 C) (Oral)  Ht 5\' 11"  (1.803  m)  Wt 211 lb 6.4 oz (95.89 kg)  BMI 29.48 kg/m2  SpO2 99%    Review of Systems denies hypoglycemia.  He has gained a few lbs.      Objective:   Physical Exam VITAL SIGNS:  See vs page GENERAL: no distress Pulses: dorsalis pedis intact bilat.   Feet: no deformity.  no ulcer on the feet.  feet are of normal color and temp.  no edema Neuro: sensation is intact to touch on the feet, but decreased from normal at the lateral aspect of both feet.    Lab Results  Component Value Date   HGBA1C 10.2* 11/30/2011      Assessment & Plan:  DM, improved on insulin Numbness, prob due to DM

## 2011-12-14 NOTE — Patient Instructions (Addendum)
Please continue the same insulin.  Also take 3 units with your bedtime snack, if you choose to eat one.  There is no medical reason to eat a bedtime snack. Please come back for a follow-up appointment in 3 months.   good diet and exercise habits significanly improve the control of your diabetes.  please let me know if you wish to be referred to a dietician.  high blood sugar is very risky to your health.  you should see an eye doctor every year. controlling your blood pressure and cholesterol drastically reduces the damage diabetes does to your body.  this also applies to quitting smoking.  please discuss these with your doctor.  you should take an aspirin every day, unless you have been advised by a doctor not to.   Increase carvedilol to 12.5 mg, 2x a day.

## 2011-12-21 ENCOUNTER — Encounter: Payer: Medicare Other | Admitting: Internal Medicine

## 2011-12-21 DIAGNOSIS — Z0289 Encounter for other administrative examinations: Secondary | ICD-10-CM

## 2012-02-21 DIAGNOSIS — I639 Cerebral infarction, unspecified: Secondary | ICD-10-CM | POA: Insufficient documentation

## 2012-02-21 DIAGNOSIS — G459 Transient cerebral ischemic attack, unspecified: Secondary | ICD-10-CM

## 2012-02-21 HISTORY — DX: Transient cerebral ischemic attack, unspecified: G45.9

## 2012-04-07 ENCOUNTER — Inpatient Hospital Stay (HOSPITAL_COMMUNITY): Payer: Medicare Other

## 2012-04-07 ENCOUNTER — Inpatient Hospital Stay (HOSPITAL_COMMUNITY)
Admission: EM | Admit: 2012-04-07 | Discharge: 2012-04-10 | DRG: 069 | Disposition: A | Discharge status: Home / Self Care | Payer: Medicare Other | Attending: Internal Medicine | Admitting: Internal Medicine

## 2012-04-07 ENCOUNTER — Emergency Department (HOSPITAL_COMMUNITY): Payer: Medicare Other

## 2012-04-07 ENCOUNTER — Encounter (HOSPITAL_COMMUNITY): Payer: Self-pay | Admitting: Family Medicine

## 2012-04-07 DIAGNOSIS — G459 Transient cerebral ischemic attack, unspecified: Secondary | ICD-10-CM

## 2012-04-07 DIAGNOSIS — Z0000 Encounter for general adult medical examination without abnormal findings: Secondary | ICD-10-CM

## 2012-04-07 DIAGNOSIS — F528 Other sexual dysfunction not due to a substance or known physiological condition: Secondary | ICD-10-CM

## 2012-04-07 DIAGNOSIS — E1169 Type 2 diabetes mellitus with other specified complication: Secondary | ICD-10-CM | POA: Insufficient documentation

## 2012-04-07 DIAGNOSIS — R209 Unspecified disturbances of skin sensation: Secondary | ICD-10-CM

## 2012-04-07 DIAGNOSIS — E782 Mixed hyperlipidemia: Secondary | ICD-10-CM | POA: Diagnosis present

## 2012-04-07 DIAGNOSIS — J019 Acute sinusitis, unspecified: Secondary | ICD-10-CM

## 2012-04-07 DIAGNOSIS — E785 Hyperlipidemia, unspecified: Secondary | ICD-10-CM

## 2012-04-07 DIAGNOSIS — I10 Essential (primary) hypertension: Secondary | ICD-10-CM

## 2012-04-07 DIAGNOSIS — I152 Hypertension secondary to endocrine disorders: Secondary | ICD-10-CM | POA: Insufficient documentation

## 2012-04-07 DIAGNOSIS — I2510 Atherosclerotic heart disease of native coronary artery without angina pectoris: Secondary | ICD-10-CM

## 2012-04-07 DIAGNOSIS — IMO0001 Reserved for inherently not codable concepts without codable children: Secondary | ICD-10-CM

## 2012-04-07 DIAGNOSIS — J309 Allergic rhinitis, unspecified: Secondary | ICD-10-CM

## 2012-04-07 DIAGNOSIS — R079 Chest pain, unspecified: Secondary | ICD-10-CM

## 2012-04-07 DIAGNOSIS — R9431 Abnormal electrocardiogram [ECG] [EKG]: Secondary | ICD-10-CM

## 2012-04-07 DIAGNOSIS — E119 Type 2 diabetes mellitus without complications: Secondary | ICD-10-CM | POA: Insufficient documentation

## 2012-04-07 DIAGNOSIS — M25519 Pain in unspecified shoulder: Secondary | ICD-10-CM

## 2012-04-07 DIAGNOSIS — R51 Headache: Secondary | ICD-10-CM

## 2012-04-07 DIAGNOSIS — E1159 Type 2 diabetes mellitus with other circulatory complications: Secondary | ICD-10-CM | POA: Insufficient documentation

## 2012-04-07 DIAGNOSIS — Z87891 Personal history of nicotine dependence: Secondary | ICD-10-CM

## 2012-04-07 DIAGNOSIS — G5760 Lesion of plantar nerve, unspecified lower limb: Secondary | ICD-10-CM

## 2012-04-07 DIAGNOSIS — R200 Anesthesia of skin: Secondary | ICD-10-CM

## 2012-04-07 DIAGNOSIS — I25810 Atherosclerosis of coronary artery bypass graft(s) without angina pectoris: Secondary | ICD-10-CM

## 2012-04-07 DIAGNOSIS — K219 Gastro-esophageal reflux disease without esophagitis: Secondary | ICD-10-CM

## 2012-04-07 LAB — CBC
HCT: 43.8 % (ref 39.0–52.0)
Hemoglobin: 15.1 g/dL (ref 13.0–17.0)
MCH: 30.8 pg (ref 26.0–34.0)
MCHC: 34.5 g/dL (ref 30.0–36.0)
MCV: 89.4 fL (ref 78.0–100.0)
Platelets: 202 10*3/uL (ref 150–400)
RBC: 4.9 MIL/uL (ref 4.22–5.81)
RDW: 12.7 % (ref 11.5–15.5)
WBC: 7.1 10*3/uL (ref 4.0–10.5)

## 2012-04-07 LAB — URINALYSIS, ROUTINE W REFLEX MICROSCOPIC
Bilirubin Urine: NEGATIVE
Glucose, UA: >1000 mg/dL — AB
Hgb urine dipstick: NEGATIVE
Ketones, ur: NEGATIVE mg/dL
Leukocytes, UA: NEGATIVE
Nitrite: NEGATIVE
Protein, ur: NEGATIVE mg/dL
Specific Gravity, Urine: 1.029 (ref 1.005–1.030)
Urobilinogen, UA: 0.2 mg/dL (ref 0.0–1.0)
pH: 6 (ref 5.0–8.0)

## 2012-04-07 LAB — COMPREHENSIVE METABOLIC PANEL
ALT: 52 U/L (ref 0–53)
AST: 30 U/L (ref 0–37)
Albumin: 4.1 g/dL (ref 3.5–5.2)
Alkaline Phosphatase: 86 U/L (ref 39–117)
BUN: 19 mg/dL (ref 6–23)
CO2: 32 mEq/L (ref 19–32)
Calcium: 9.6 mg/dL (ref 8.4–10.5)
Chloride: 99 mEq/L (ref 96–112)
Creatinine, Ser: 1.34 mg/dL (ref 0.50–1.35)
GFR calc Af Amer: 64 mL/min — ABNORMAL LOW (ref >90)
GFR calc non Af Amer: 55 mL/min — ABNORMAL LOW (ref >90)
Glucose, Bld: 286 mg/dL — ABNORMAL HIGH (ref 70–99)
Potassium: 4.3 mEq/L (ref 3.5–5.1)
Sodium: 138 mEq/L (ref 135–145)
Total Bilirubin: 0.8 mg/dL (ref 0.3–1.2)
Total Protein: 8.3 g/dL (ref 6.0–8.3)

## 2012-04-07 LAB — CARDIAC PANEL(CRET KIN+CKTOT+MB+TROPI)
CK, MB: 2.3 ng/mL (ref 0.3–4.0)
CK, MB: 2.3 ng/mL (ref 0.3–4.0)
Relative Index: 1.6 (ref 0.0–2.5)
Relative Index: 1.4 (ref 0.0–2.5)
Total CK: 145 U/L (ref 7–232)
Total CK: 167 U/L (ref 7–232)
Troponin I: <0.3 ng/mL (ref <0.30)
Troponin I: <0.3 ng/mL (ref <0.30)

## 2012-04-07 LAB — URINE MICROSCOPIC-ADD ON: Urine-Other: NONE SEEN

## 2012-04-07 LAB — PROTIME-INR
INR: 0.99 (ref 0.00–1.49)
Prothrombin Time: 13.3 seconds (ref 11.6–15.2)

## 2012-04-07 LAB — DIFFERENTIAL
Basophils Absolute: 0 10*3/uL (ref 0.0–0.1)
Basophils Relative: 0 % (ref 0–1)
Eosinophils Absolute: 0.2 10*3/uL (ref 0.0–0.7)
Eosinophils Relative: 2 % (ref 0–5)
Lymphocytes Relative: 48 % — ABNORMAL HIGH (ref 12–46)
Lymphs Abs: 3.4 10*3/uL (ref 0.7–4.0)
Monocytes Absolute: 0.7 10*3/uL (ref 0.1–1.0)
Monocytes Relative: 10 % (ref 3–12)
Neutro Abs: 2.9 10*3/uL (ref 1.7–7.7)
Neutrophils Relative %: 40 % — ABNORMAL LOW (ref 43–77)

## 2012-04-07 LAB — POCT I-STAT TROPONIN I: Troponin i, poc: 0 ng/mL (ref 0.00–0.08)

## 2012-04-07 LAB — GLUCOSE, CAPILLARY
Glucose-Capillary: 290 mg/dL — ABNORMAL HIGH (ref 70–99)
Glucose-Capillary: 150 mg/dL — ABNORMAL HIGH (ref 70–99)

## 2012-04-07 LAB — APTT: aPTT: 32 seconds (ref 24–37)

## 2012-04-07 MED ORDER — ASPIRIN 81 MG PO CHEW
324.0000 mg | CHEWABLE_TABLET | Freq: Once | ORAL | Status: AC
  Administered 2012-04-07: 324 mg via ORAL
  Filled 2012-04-07: qty 4

## 2012-04-07 MED ORDER — INSULIN ASPART 100 UNIT/ML ~~LOC~~ SOLN
0.0000 [IU] | Freq: Three times a day (TID) | SUBCUTANEOUS | Status: DC
  Administered 2012-04-08: 3 [IU] via SUBCUTANEOUS
  Administered 2012-04-08: 8 [IU] via SUBCUTANEOUS

## 2012-04-07 MED ORDER — ASPIRIN EC 325 MG PO TBEC
325.0000 mg | DELAYED_RELEASE_TABLET | Freq: Every day | ORAL | Status: DC
  Filled 2012-04-07: qty 1

## 2012-04-07 MED ORDER — SIMVASTATIN 20 MG PO TABS
20.0000 mg | ORAL_TABLET | Freq: Every day | ORAL | Status: DC
  Filled 2012-04-07: qty 1

## 2012-04-07 MED ORDER — GADOBENATE DIMEGLUMINE 529 MG/ML IV SOLN
20.0000 mL | Freq: Once | INTRAVENOUS | Status: AC | PRN
  Administered 2012-04-07: 20 mL via INTRAVENOUS

## 2012-04-07 MED ORDER — HEPARIN SODIUM (PORCINE) 5000 UNIT/ML IJ SOLN
5000.0000 [IU] | Freq: Three times a day (TID) | INTRAMUSCULAR | Status: DC
  Administered 2012-04-07 – 2012-04-10 (×9): 5000 [IU] via SUBCUTANEOUS
  Filled 2012-04-07 (×11): qty 1

## 2012-04-07 MED ORDER — NITROGLYCERIN 0.3 MG SL SUBL
0.3000 mg | SUBLINGUAL_TABLET | SUBLINGUAL | Status: DC | PRN
  Filled 2012-04-07: qty 100

## 2012-04-07 MED ORDER — SODIUM CHLORIDE 0.9 % IJ SOLN
3.0000 mL | Freq: Two times a day (BID) | INTRAMUSCULAR | Status: DC
  Administered 2012-04-07 – 2012-04-09 (×5): 3 mL via INTRAVENOUS

## 2012-04-07 MED ORDER — CARVEDILOL 3.125 MG PO TABS
3.1250 mg | ORAL_TABLET | Freq: Two times a day (BID) | ORAL | Status: DC
  Filled 2012-04-07 (×3): qty 1

## 2012-04-07 MED ORDER — INSULIN GLARGINE 100 UNIT/ML ~~LOC~~ SOLN
5.0000 [IU] | Freq: Every day | SUBCUTANEOUS | Status: DC
  Administered 2012-04-07: 5 [IU] via SUBCUTANEOUS

## 2012-04-07 NOTE — Consult Note (Signed)
Chief Complaint: Left-sided weakness and numbness  HPI: [name] is an 61 y.o. male with a history of hypertension, hyperlipidemia, coronary artery disease and insulin-dependent diabetes mellitus, presenting with acute onset of numbness involving left face arm and trauma starting at 1550 today. There is a history of possible TIA in 2007. Patient has been taking aspirin 325 mg per day. CT scan of his head showed no acute intracranial abnormality. White matter chronic ischemic changes were noted, as well as indications of possible sinusitis. Patient's sensory symptoms have improved since she arrived in the emergency room, as has strength in the left side. There were no speech changes. There also notate changes. NIH stroke score was 1 for mild residual sensory abnormalities on the left.  LSN: [id number] on 04/07/2012 tPA Given: No: Mild focal deficits only MRankin: 0  Past Medical History  Diagnosis Date  . Acute sinusitis, unspecified 10/26/2010  . ALLERGIC RHINITIS 04/13/2010  . CAD, NATIVE VESSEL 03/12/2009  . CHEST PAIN-UNSPECIFIED 03/12/2009  . DIAB W/UNSPEC COMP TYPE II/UNSPEC TYPE UNCNTRL 02/26/2010  . DIABETES MELLITUS, TYPE II, UNCONTROLLED 04/13/2010  . ERECTILE DYSFUNCTION 03/12/2009  . GERD 04/13/2010  . Headache 04/13/2010  . HYPERLIPIDEMIA-MIXED 03/07/2009  . HYPERTENSION, BENIGN 03/12/2009  . [place], RIGHT 05/29/2010  . OTITIS MEDIA, ACUTE, RIGHT 05/29/2010  . SHOULDER PAIN, RIGHT 04/13/2010  . TOBACCO USE, QUIT 04/13/2010  . Complication of anesthesia     hard time waking up    Family History  Problem Relation Age of Onset  . Stroke       Medications:  Prior to Admission:  (Not in a hospital admission) Scheduled:   . aspirin  324 mg Oral Once  . aspirin EC  325 mg Oral Daily  . carvedilol  3.125 mg Oral BID WC  . heparin  5,000 Units Subcutaneous Q8H  . insulin aspart  0-15 Units Subcutaneous TID WC  . insulin glargine  5 Units Subcutaneous QHS  . simvastatin   20 mg Oral q1800  . sodium chloride  3 mL Intravenous Q12H  . DISCONTD: aspirin EC  325 mg Oral Daily     Physical Examination: Blood pressure 171/93, pulse 77, temperature 99.4 F (37.4 C), temperature source Oral, resp. rate 12, SpO2 100.00%.  Neurologic Examination: Mental Status: Alert, oriented, thought content appropriate.  Speech fluent without evidence of aphasia. Able to follow commands without difficulty. Cranial Nerves: II-Visual fields were normal. III/IV/VI-Pupils were equal and reacted. Extraocular movements were full and conjugate.    V/VII-slight left lower facial numbness; no facial weakness. VIII-normal. X-normal speech and symmetrical palatal movement. XII-midline tongue extension Motor: 5/5 bilaterally with normal tone and bulk Sensory: Slightly reduced perception of tactile sensation over left arm and leg compared to the right extremities Deep Tendon Reflexes: 1 + and symmetric. Plantars: Flexor bilaterally Cerebellar: Normal finger-to-nose testing bilaterally. Carotid auscultation: Normal   Ct Head Wo Contrast  04/07/2012  *RADIOLOGY REPORT*  Clinical Data: Left-sided numbness and weakness.  Right sided facial twitching.  CT HEAD WITHOUT CONTRAST  Technique:  Contiguous axial images were obtained from the base of the skull through the vertex without contrast.  Comparison: CT of head 11/08/2008.  Findings: No definite acute intracranial abnormalities. Specifically, no definitive signs of acute/subacute ischemia, no focal mass, mass effect, intracranial hemorrhage, hydrocephalus or other abnormal intra or extra-axial fluid collections. There are patchy areas of decreased attenuation throughout the deep and periventricular white matter of the cerebral hemispheres bilaterally, compatible with chronic microvascular ischemic changes (similar to  prior study).  The no displaced skull fractures are identified.  Visualized paranasal sinuses and mastoids are generally well  pneumatized, with exception of extensive mucoperiosteal thickening in the right mastoid sinus.  IMPRESSION: 1.  No acute intracranial abnormalities. 2.  Chronic microvascular ischemic changes in the cerebral white matter redemonstrated, as above. 3.  Changes compatible with chronic sinusitis in the right maxillary sinus again noted.  Original Report Authenticated By: Etheleen Mayhew, M.D.   Dg Chest Port 1 View  04/07/2012  *RADIOLOGY REPORT*  Clinical Data: Numbness, history hypertension, diabetes, smoking  PORTABLE CHEST - 1 VIEW  Comparison: Portable exam 1702 hours compared to 11/08/2008  Findings: Minimal prominence of cardiac silhouette unchanged. Tortuous aorta. Pulmonary vascularity normal for technique. Lungs grossly clear. No pleural effusion or pneumothorax. Bones unremarkable.  IMPRESSION: No acute abnormalities.  Original Report Authenticated By: Burnetta Sabin, M.D.    Assessment: 61 y.o. male with probable transient ischemic attack, most likely subcortical, small vessel, involving the right MCA territory. Acute small vessel subcortical ischemic stroke cannot be ruled out at this point.  Stroke Risk Factors - diabetes mellitus, family history, hyperlipidemia and hypertension  Plan: 1. HgbA1c, fasting lipid panel 2. MRI, MRA  of the brain without contrast 3. MRA of the neck 4. Echocardiogram 5. PT and OT consults 6. Prophylactic therapy-Antiplatelet med: Plavix 75 mg per day 7. Risk factor modification 8. Telemetry monitoring  C.R. Nicole Kindred, MD [facility] [phone]  04/07/2012, 9:51 PM

## 2012-04-07 NOTE — H&P (Signed)
PCP:   [name], MD, MD   Chief Complaint:  Numbness to L side  States that this pm aourd 3-4 hours ago at about 4 pm started to have numbness over the entire l side of the body.  This seemed to come on out of the blue.  He was sitting in his car waiting to meet with his daughter. Had some weakness to the whole l side of the body-he also experienced some lightheadedness-he opened his window of the car as he didn't have feeling on the L side-this feeling to persist for some period of time and did not resolve. Also had some dizziness which went away after some times-- body still stayed numb and came over to the ED.  Has had a hospitalization in 2009 and might have had a ministroke.  Does take ASa daily.  NO noted sz, slurred speech or faiting like activity .  When he allegedly went to smile he seemed to have more twisting of the face    He does state that he has had some twitching of the thigh and the leg face and muscles in his arm since starting insulin, which was 4 months ago.  The twitching seems to start in March and his feet seems to "lock-up when laying down and cramps  Review of Systems:  The patient denies anorexia, no fever but was sweaty last night amd felt sweaty with the Dizzyness and malaise he had this afternoon, weight fluctuation from DM, double vision +, decreased hearing, hoarseness, chest pain, syncope, dyspnea on exertion, peripheral edema, balance deficits, hemoptysis, abdominal pain, sometiejms has dark stool-1-2 x per week, hematochezia, severe indigestion/heartburn, hematuria, incontinence-sometiems urgency, genital sores, muscle weakness, suspicious skin lesions, transient blindness, difficulty walking, depression, unusual weight change, abnormal bleeding, enlarged lymph nodes, angioedema, and breast masses.  Past Medical History: Past Medical History  Diagnosis Date  . Acute sinusitis, unspecified 10/26/2010  . ALLERGIC RHINITIS 04/13/2010  . CAD, NATIVE VESSEL  03/12/2009  . CHEST PAIN-UNSPECIFIED 03/12/2009  . DIAB W/UNSPEC COMP TYPE II/UNSPEC TYPE UNCNTRL 02/26/2010  . DIABETES MELLITUS, TYPE II, UNCONTROLLED 04/13/2010  . ERECTILE DYSFUNCTION 03/12/2009  . GERD 04/13/2010  . Headache 04/13/2010  . HYPERLIPIDEMIA-MIXED 03/07/2009  . HYPERTENSION, BENIGN 03/12/2009  . [place], RIGHT 05/29/2010  . OTITIS MEDIA, ACUTE, RIGHT 05/29/2010  . SHOULDER PAIN, RIGHT 04/13/2010  . TOBACCO USE, QUIT 04/13/2010  . Complication of anesthesia     hard time waking up  CAD=angioplasty + stent  In 2000 [place], s/p [place] stent  Feb 13 2004 placement 1st obstuse marginal 2/2 to in-stent stenosis-Cardiologist [place] DM ty 2, A1c 12/14/2011 was 10.2-now controlled by insulin diagnosed 7 yr ago degen disc disease ?TIa 11/08/2008 Lower back injuries R shoulder injurries and surgeries-from Job related injuries  Past surgical history: Past Surgical History  Procedure Date  . Stent 2001, 2004    coronary stents  . Angioplasty   . Left knee surgury   . Dental surgery     Medications: Prior to Admission medications   Medication Sig Start Date End Date Taking? Authorizing Provider  aspirin 325 MG tablet Take 325 mg by mouth daily.     Yes Historical Provider, MD  carvedilol (COREG) 12.5 MG tablet Take 1 tablet (12.5 mg total) by mouth 2 (two) times daily with a meal. 12/14/11 12/13/12 Yes Renato Shin, MD  diphenhydrAMINE (BENADRYL) 25 MG tablet Take 25 mg by mouth every 6 (six) hours as needed.   Yes Historical  Provider, MD  fexofenadine (ALLEGRA) 180 MG tablet Take 180 mg by mouth daily.   Yes Historical Provider, MD  insulin aspart (NOVOLOG FLEXPEN) 100 UNIT/ML injection Inject 10 Units into the skin 3 (three) times daily before meals. And pen needles 3/day 11/30/11 11/29/12 Yes Renato Shin, MD  lisinopril-hydrochlorothiazide (PRINZIDE,ZESTORETIC) 20-12.5 MG per tablet Take 1 tablet by mouth daily.     Yes Historical Provider, MD  nitroGLYCERIN (NITROSTAT)  0.4 MG SL tablet 1 tablet under tongue at onset of chest pain; you may repeat every 5 minutes for up to 3 doses.    Yes Historical Provider, MD  pravastatin (PRAVACHOL) 20 MG tablet Take 20 mg by mouth at bedtime.     Yes Historical Provider, MD  B-D ULTRAFINE III SHORT PEN 31G X 8 MM MISC Use as directed three times daily 11/30/11   Historical Provider, MD    Allergies:   Allergies  Allergen Reactions  . Penicillins Anaphylaxis  . Pneumococcal Vaccines     Pt states allergic to it.  . Shellfish Allergy Anaphylaxis    Social History:  reports that he quit smoking about 29 years ago. He has never used smokeless tobacco. He reports that he does not drink alcohol or use illicit drugs.  Family History: Family History  Problem Relation Age of Onset  . Cancer Other     colon cancer 1st degree relative <60 and prostate cancer     Physical Exam: Filed Vitals:   04/07/12 1656 04/07/12 1740 04/07/12 1809 04/07/12 1813  BP: 178/97 181/94  171/93  Pulse:      Temp:   99.4 F (37.4 C) 99.4 F (37.4 C)  TempSrc:    Oral  Resp: 21 20  12   SpO2: 100% 100%  100%    HEENT-alert oriented African American male no apparent distress CHEST-clinically clear no added sound, no tactile vocal resonance and fremitus.  Has some pain on pressure over the left lower sternal edge CARDS-S1-S2 no murmur rub or gallop-telemetry shows T-wave inversions ABD-soft nontender nondistended, no rebound or guarding SKIN-no allergic swelling NEURO-alert, oriented x3 speech: normal in context and clarity memory: intact grossly cranial nerves II-XII: intact cerebellar: finger-to-nose and heel-to-shin intact reflexes: full and symmetric plantar responses: downgoing bilaterally Sensation somewhat attenuated over the left side of the body however still present. Gait not assessed. Left lower extremity somewhat weaker than right lower extremity, but this is chronic per patient secondary to back surgeries   Labs on  Admission:   [facility] 04/07/12 1702  NA 138  K 4.3  CL 99  CO2 32  GLUCOSE 286*  BUN 19  CREATININE 1.34  CALCIUM 9.6  MG --  PHOS --    Basename 04/07/12 1702  AST 30  ALT 52  ALKPHOS 86  BILITOT 0.8  PROT 8.3  ALBUMIN 4.1   No results found for this basename: LIPASE:2,AMYLASE:2 in the last 72 hours  Basename 04/07/12 1702  WBC 7.1  NEUTROABS 2.9  HGB 15.1  HCT 43.8  MCV 89.4  PLT 202    Basename 04/07/12 1702  CKTOTAL 145  CKMB 2.3  CKMBINDEX --  TROPONINI <0.30   No results found for this basename: TSH,T4TOTAL,FREET3,T3FREE,THYROIDAB in the last 72 hours No results found for this basename: VITAMINB12:2,FOLATE:2,FERRITIN:2,TIBC:2,IRON:2,RETICCTPCT:2 in the last 72 hours  Radiological Exams on Admission: Ct Head Wo Contrast  04/07/2012  *RADIOLOGY REPORT*  Clinical Data: Left-sided numbness and weakness.  Right sided facial twitching.  CT HEAD WITHOUT CONTRAST  Technique:  Contiguous  axial images were obtained from the base of the skull through the vertex without contrast.  Comparison: CT of head 11/08/2008.  Findings: No definite acute intracranial abnormalities. Specifically, no definitive signs of acute/subacute ischemia, no focal mass, mass effect, intracranial hemorrhage, hydrocephalus or other abnormal intra or extra-axial fluid collections. There are patchy areas of decreased attenuation throughout the deep and periventricular white matter of the cerebral hemispheres bilaterally, compatible with chronic microvascular ischemic changes (similar to prior study).  The no displaced skull fractures are identified.  Visualized paranasal sinuses and mastoids are generally well pneumatized, with exception of extensive mucoperiosteal thickening in the right mastoid sinus.  IMPRESSION: 1.  No acute intracranial abnormalities. 2.  Chronic microvascular ischemic changes in the cerebral white matter redemonstrated, as above. 3.  Changes compatible with chronic sinusitis in the  right maxillary sinus again noted.  Original Report Authenticated By: Etheleen Mayhew, M.D.   Dg Chest Port 1 View  04/07/2012  *RADIOLOGY REPORT*  Clinical Data: Numbness, history hypertension, diabetes, smoking  PORTABLE CHEST - 1 VIEW  Comparison: Portable exam 1702 hours compared to 11/08/2008  Findings: Minimal prominence of cardiac silhouette unchanged. Tortuous aorta. Pulmonary vascularity normal for technique. Lungs grossly clear. No pleural effusion or pneumothorax. Bones unremarkable.  IMPRESSION: No acute abnormalities.  Original Report Authenticated By: Burnetta Sabin, M.D.    Assessment/Plan 1-numbness-given his overall symptomatology and his past history of TIA in 2009, in addition to him having significant coronary artery disease and suboptimally controlled blood pressure by patient's own admission I feel it is reasonable to get an MRI/MRA to rule out any intracranial process further. CT scan that's already been done in the emergency room 5/70 rules out acute CVA however shows chronic microvascular and ischemic changes in the cerebral white matter.   Given that I feel the note of him having an acute CVA is slightly lower than possible acute coronary syndrome, I feel it is prudent to reimplement his low dose beta blocker despite him blood pressure being below the threshold of 220/120 itches where patient would usually have blood pressures at this level tested if we thought had a stroke. Nevertheless I think it is prudent to hold off on the Zestoretic for now.  Neurologist consult has been obtained for this specific issue as well as the numbness and twitching sensation of his right side-I will get an MRI MRA for now however defer further workup to neurologist pending the exam and interview of patient I will get a magnesium level as this may be biochemical in origin, however I suspect this may be a tick that he has. I did not witness any of these findings while interviewing patient     2-Acute Coronary syndrome with significant EKG changes in the setting of a patient who has had angioplasty and stent in year 2000 and then in-stent stenosis in 2005 . EKG done today shows PR interval 0.12 technique and T-wave depressions or 2 mm in lead 23 aVF ST elevations in aVL and ST elevations in V1 through 2 and then depressions in the 4 through V6. These are significant changes from March 2005 EKG where T waves are upright. Cardiologist Dr. Acie Fredrickson has seen patient and explained to patient plan of care which might include noninvasive testing initially and then possible further invasive cardiac catheterization. Patient will stay at [facility] long for now and might be transferred to [facility] for workup of these issues-determination whether patient needs systemic anticoagulation with IV heparin would be per cardiologist  Patient will have an EKG in the morning and cycle cardiac enzymes. Further management to be determined by cardiologist  3-evidence for a control diabetes mellitus-patient seems state insulin 3 times a day only as regular insulin. It is unclear whether he takes long-acting insulin as well. We will get a diabetes nurse consult to help delineate areas to his diabetes control-will also get an A1c and sliding scale coverage and start Lantus 5 units daily.  4-uncontrolled hypertension-patient is on Coreg 12.5 twice a day Zestoretic 20/12.5 daily. Patient is to continue these medications once out of 24 to where we would think patient is having a stroke. Please see above discussion and plan  5- musculoskeletal pain and arthritis-the patient has had numerous fractures and low back pain. Patient will need to be on scheduled pain regimen-it would probably be prudent to discontinue NSAIDs and his specific case.  6-hyperlipidemia -we will continue Zocor for now. We will risk stratify with lipid panel in the morning  Full code >50 min coordinating admit and discussion with consultants Discussed  with Wife in room who understands   [facility] 04/07/2012, 6:54 PM

## 2012-04-07 NOTE — Consult Note (Signed)
CONSULT NOTE  Date: 04/07/2012               Patient Name:  [name] MRN: [id number]  DOB: November 20, 1951 Age / Sex: 61 y.o., male        PCP: [name], MD Primary Cardiologist: Pierre Bali, MD            Referring Physician: Dirk Dress ER, Verneita Griffes, MD              Reason for Consult:  abnormal EKG            History of Present Illness: Patient is a 61 y.o. male with a PMHx of diabetes mellitus, coronary artery disease, and hyperlipidemia, who was admitted to [facility] on 04/07/2012 for evaluation of left-sided numbness and tingling.  Mr. Woznick is a 61 year old gentleman with a history of coronary stenting in the past.  He's had diabetes mellitus that's been relatively poorly controlled. He reports having a 15 pound weight loss over the past several months. He was started on insulin and now has started gaining weight.  About a week ago he had one or 2 episodes of chest pain. Was described as a pinching-like chest pain lasted for 5 or 10 minutes. He was working on the computer when it started. It was relieved spontaneously. He has an old prescription for nitroglycerin but tablets had disintegrated for the most part.  He does not get any regular exercise. He has started back at school at the [employer] and has not had time to do an exercise. He's not noticed any specific chest pain with exertion. He does become short of breath with exertion.    Medications: Outpatient medications:  (Not in a hospital admission)  Current medications: Current Facility-Administered Medications  Medication Dose Route Frequency Provider Last Rate Last Dose  . aspirin chewable tablet 324 mg  324 mg Oral Once Julianne Rice, MD   324 mg at 04/07/12 1812  . aspirin EC tablet 325 mg  325 mg Oral Daily Robbie Lis, MD      . heparin injection 5,000 Units  5,000 Units Subcutaneous Q8H Nita Sells, MD      . sodium chloride 0.9 % injection 3 mL  3 mL Intravenous  Q12H Nita Sells, MD      . DISCONTD: aspirin EC tablet 325 mg  325 mg Oral Daily Nita Sells, MD       Current Outpatient Prescriptions  Medication Sig Dispense Refill  . aspirin 325 MG tablet Take 325 mg by mouth daily.        . carvedilol (COREG) 12.5 MG tablet Take 1 tablet (12.5 mg total) by mouth 2 (two) times daily with a meal.  60 tablet  11  . diphenhydrAMINE (BENADRYL) 25 MG tablet Take 25 mg by mouth every 6 (six) hours as needed.      . fexofenadine (ALLEGRA) 180 MG tablet Take 180 mg by mouth daily.      . insulin aspart (NOVOLOG FLEXPEN) 100 UNIT/ML injection Inject 10 Units into the skin 3 (three) times daily before meals. And pen needles 3/day  15 mL  12  . lisinopril-hydrochlorothiazide (PRINZIDE,ZESTORETIC) 20-12.5 MG per tablet Take 1 tablet by mouth daily.        . nitroGLYCERIN (NITROSTAT) 0.4 MG SL tablet 1 tablet under tongue at onset of chest pain; you may repeat every 5 minutes for up to 3 doses.       . pravastatin (PRAVACHOL) 20 MG tablet  Take 20 mg by mouth at bedtime.        . B-D ULTRAFINE III SHORT PEN 31G X 8 MM MISC Use as directed three times daily         Allergies  Allergen Reactions  . Penicillins Anaphylaxis  . Pneumococcal Vaccines     Pt states allergic to it.  Marland Kitchen Shellfish Allergy Anaphylaxis     Past Medical History  Diagnosis Date  . Acute sinusitis, unspecified 10/26/2010  . ALLERGIC RHINITIS 04/13/2010  . CAD, NATIVE VESSEL 03/12/2009  . CHEST PAIN-UNSPECIFIED 03/12/2009  . DIAB W/UNSPEC COMP TYPE II/UNSPEC TYPE UNCNTRL 02/26/2010  . DIABETES MELLITUS, TYPE II, UNCONTROLLED 04/13/2010  . ERECTILE DYSFUNCTION 03/12/2009  . GERD 04/13/2010  . Headache 04/13/2010  . HYPERLIPIDEMIA-MIXED 03/07/2009  . HYPERTENSION, BENIGN 03/12/2009  . [place], RIGHT 05/29/2010  . OTITIS MEDIA, ACUTE, RIGHT 05/29/2010  . SHOULDER PAIN, RIGHT 04/13/2010  . TOBACCO USE, QUIT 04/13/2010  . Complication of anesthesia     hard time waking up     Past Surgical History  Procedure Date  . Stent 2001, 2004    coronary stents  . Angioplasty   . Left knee surgury   . Dental surgery     Family History  Problem Relation Age of Onset  . Cancer Other     colon cancer 1st degree relative <60 and prostate cancer     Social History:  reports that he quit smoking about 29 years ago. He has never used smokeless tobacco. He reports that he does not drink alcohol or use illicit drugs.   Review of Systems: Constitutional:  denies fever, chills, diaphoresis, appetite change and fatigue.  HEENT: denies photophobia, eye pain, redness, hearing loss, ear pain, congestion, sore throat, rhinorrhea, sneezing, neck pain, neck stiffness and tinnitus.  Respiratory: admits to cough,  wheezing.  Cardiovascular: denies chest pain, palpitations and leg swelling.  Gastrointestinal: denies nausea, vomiting, abdominal pain, diarrhea, constipation, blood in stool.  Genitourinary: denies dysuria, urgency, frequency, hematuria, flank pain and difficulty urinating.  Musculoskeletal: denies  myalgias, back pain, joint swelling, arthralgias and gait problem.   Skin: denies pallor, rash and wound.  Neurological: admits to numbness on the left side of his body. He also has been having some muscle twitching. This seemed to start after he started his insulin.   Hematological: denies adenopathy, easy bruising, personal or family bleeding history.  Psychiatric/ Behavioral: denies suicidal ideation, mood changes, confusion, nervousness, sleep disturbance and agitation.    Physical Exam: BP 171/93  Pulse 77  Temp(Src) 99.4 F (37.4 C) (Oral)  Resp 12  SpO2 100%  General: Vital signs reviewed and noted. Well-developed, well-nourished, in no acute distress; alert, appropriate and cooperative throughout examination.  Head: Normocephalic, atraumatic, sclera anicteric, mucus membranes are moist  Neck: Supple. Negative for carotid bruits. JVD not elevated.   Lungs:  Clear bilaterally to auscultation without wheezes, rales, or rhonchi. Breathing is unlabored.  Heart: RRR with S1 S2. No murmurs, rubs, or gallops appreciated.  Abdomen:  Soft, non-tender, non-distended with normoactive bowel sounds. No hepatomegaly. No rebound/guarding. No obvious abdominal masses  MSK: Strength and the appear normal for age.  Extremities: No clubbing or cyanosis. No edema.  Distal pedal pulses are 2+ and equal bilaterally.  Neurologic: Alert and oriented X 3. Moves all extremities spontaneously.  Psych: Responds to questions appropriately with a normal affect.    Lab results: Basic Metabolic Panel:  Lab 0000000 1702  NA 138  K 4.3  CL  99  CO2 32  GLUCOSE 286*  BUN 19  CREATININE 1.34  CALCIUM 9.6  MG --  PHOS --    Liver Function Tests:  Lab 04/07/12 1702  AST 30  ALT 52  ALKPHOS 86  BILITOT 0.8  PROT 8.3  ALBUMIN 4.1   No results found for this basename: LIPASE:3,AMYLASE:3 in the last 168 hours No results found for this basename: AMMONIA:3 in the last 168 hours  CBC:  Lab 04/07/12 1702  WBC 7.1  NEUTROABS 2.9  HGB 15.1  HCT 43.8  MCV 89.4  PLT 202    Cardiac Enzymes:  Lab 04/07/12 1702  CKTOTAL 145  CKMB 2.3  CKMBINDEX --  TROPONINI <0.30    BNP: No components found with this basename: POCBNP:3  CBG:  Lab 04/07/12 1620  GLUCAP 290*    Coagulation Studies:  Basename 04/07/12 1702  LABPROT 13.3  INR 0.99     Other results: EKG from 2005 reveals normal sinus rhythm with no acute ST or T wave changes.   His EKG in the emergency the part today reveals normal sinus rhythm. He has deep ST segment depression and T-wave inversion in leads 2, 3, and aVF. He also has marked T-wave inversion in leads V3 4 through V6. He has mild ST segment elevation in lead aVL.  Imaging: Ct Head Wo Contrast  04/07/2012  *RADIOLOGY REPORT*  Clinical Data: Left-sided numbness and weakness.  Right sided facial twitching.  CT HEAD  WITHOUT CONTRAST  Technique:  Contiguous axial images were obtained from the base of the skull through the vertex without contrast.  Comparison: CT of head 11/08/2008.  Findings: No definite acute intracranial abnormalities. Specifically, no definitive signs of acute/subacute ischemia, no focal mass, mass effect, intracranial hemorrhage, hydrocephalus or other abnormal intra or extra-axial fluid collections. There are patchy areas of decreased attenuation throughout the deep and periventricular white matter of the cerebral hemispheres bilaterally, compatible with chronic microvascular ischemic changes (similar to prior study).  The no displaced skull fractures are identified.  Visualized paranasal sinuses and mastoids are generally well pneumatized, with exception of extensive mucoperiosteal thickening in the right mastoid sinus.  IMPRESSION: 1.  No acute intracranial abnormalities. 2.  Chronic microvascular ischemic changes in the cerebral white matter redemonstrated, as above. 3.  Changes compatible with chronic sinusitis in the right maxillary sinus again noted.  Original Report Authenticated By: Etheleen Mayhew, M.D.   Dg Chest Port 1 View  04/07/2012  *RADIOLOGY REPORT*  Clinical Data: Numbness, history hypertension, diabetes, smoking  PORTABLE CHEST - 1 VIEW  Comparison: Portable exam 1702 hours compared to 11/08/2008  Findings: Minimal prominence of cardiac silhouette unchanged. Tortuous aorta. Pulmonary vascularity normal for technique. Lungs grossly clear. No pleural effusion or pneumothorax. Bones unremarkable.  IMPRESSION: No acute abnormalities.  Original Report Authenticated By: Burnetta Sabin, M.D.       Assessment & Plan:  1.   Abnormal EKG:  The patient has a history of coronary artery disease and has had several stents placed.  He has marked EKG changes. His cardiac enzymes are negative x1.  He had some unusual episodes of chest discomfort last week. If these episodes of chest pain  last week had been an acute coronary syndrome I would expect these cardiac enzymes to be elevated. I would agree with getting cardiac enzymes every 8 hours x3. We will order an echocardiogram. At the very least he will need a stress Myoview study to rule out coronary ischemia. If his  cardiac enzymes become positive or if he develops any severe symptoms then we'll proceed to the Cath Lab urgently.  2. Leg cramps: The patient describes having leg cramps that started after he started receiving insulin injections. I wonder if these may be related to hypokalemia. His potassium level right now is 4.3. I agree with a getting a magnesium level.  3. Hypertension: His blood pressure was moderately elevated today.  He's not had any of his medications yet today. I would restart his carvedilol 12.5 mg twice a day, lisinopril HCTZ 20 mg/12.5 mg once a day.   We'll see him tomorrow on rounds and continue to follow with you.  Thayer Headings, Brooke Bonito., MD, [facility] 04/07/2012, 7:21 PM

## 2012-04-07 NOTE — ED Provider Notes (Signed)
History     CSN: [id number]  Arrival date & time 04/07/12  1615   First MD Initiated Contact with Patient 04/07/12 1636      Chief Complaint  Patient presents with  . Numbness    (Consider location/radiation/quality/duration/timing/severity/associated sxs/prior treatment) HPI Pt has had 1 week of R facial twitching and and then began to have L-sided decreased sensation starting at 1550 today. Sensory changes include face, torso and ext. No weakness, visual changes, dizziness, CP, SOB, N/V. Pt has hx of CAD with stent x 2 placed.  Past Medical History  Diagnosis Date  . Acute sinusitis, unspecified 10/26/2010  . ALLERGIC RHINITIS 04/13/2010  . CAD, NATIVE VESSEL 03/12/2009  . CHEST PAIN-UNSPECIFIED 03/12/2009  . DIAB W/UNSPEC COMP TYPE II/UNSPEC TYPE UNCNTRL 02/26/2010  . DIABETES MELLITUS, TYPE II, UNCONTROLLED 04/13/2010  . ERECTILE DYSFUNCTION 03/12/2009  . GERD 04/13/2010  . Headache 04/13/2010  . HYPERLIPIDEMIA-MIXED 03/07/2009  . HYPERTENSION, BENIGN 03/12/2009  . [place], RIGHT 05/29/2010  . OTITIS MEDIA, ACUTE, RIGHT 05/29/2010  . SHOULDER PAIN, RIGHT 04/13/2010  . TOBACCO USE, QUIT 04/13/2010    Past Surgical History  Procedure Date  . Stent 2001  . Angioplasty   . Left knee surgury     Family History  Problem Relation Age of Onset  . Cancer Other     colon cancer 1st degree relative <60 and prostate cancer     History  Substance Use Topics  . Smoking status: Former Smoker    Quit date: 02/23/1993  . Smokeless tobacco: Not on file   Comment: Married, livesin [place] with wife. He is disable secondary to back pain. 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 Alcohol Use: No      Review of Systems  Constitutional: Negative for fever and chills.  HENT: Negative for facial swelling, trouble swallowing, neck pain, neck stiffness and voice change.   Eyes: Negative for visual disturbance.  Respiratory: Negative for cough, shortness of breath and wheezing.     Cardiovascular: Negative for chest pain, palpitations and leg swelling.  Gastrointestinal: Negative for nausea, vomiting, abdominal pain and diarrhea.  Genitourinary: Negative for dysuria and flank pain.  Skin: Negative for pallor and rash.  Neurological: Positive for numbness. Negative for dizziness, tremors, seizures, syncope, facial asymmetry, speech difficulty, weakness, light-headedness and headaches.    Allergies  Penicillins  Home Medications   Current Outpatient Rx  Name Route Sig Dispense Refill  . ASPIRIN 325 MG PO TABS Oral Take 325 mg by mouth daily.      Marland Kitchen CARVEDILOL 12.5 MG PO TABS Oral Take 1 tablet (12.5 mg total) by mouth 2 (two) times daily with a meal. 60 tablet 11  . DIPHENHYDRAMINE HCL 25 MG PO TABS Oral Take 25 mg by mouth every 6 (six) hours as needed.    Marland Kitchen FEXOFENADINE HCL 180 MG PO TABS Oral Take 180 mg by mouth daily.    . INSULIN ASPART 100 UNIT/ML [place] SOLN Subcutaneous Inject 10 Units into the skin 3 (three) times daily before meals. And pen needles 3/day 15 mL 12  . LISINOPRIL-HYDROCHLOROTHIAZIDE 20-12.5 MG PO TABS Oral Take 1 tablet by mouth daily.      Marland Kitchen NITROGLYCERIN 0.4 MG SL SUBL  1 tablet under tongue at onset of chest pain; you may repeat every 5 minutes for up to 3 doses.     Marland Kitchen PRAVASTATIN SODIUM 20 MG PO TABS Oral Take 20 mg by mouth at bedtime.      Marland Kitchen  BD PEN NEEDLE SHORT U/F 31G X 8 MM MISC  Use as directed three times daily      BP 171/93  Pulse 77  Temp(Src) 99.4 F (37.4 C) (Oral)  Resp 12  SpO2 100%  Physical Exam  Nursing note and vitals reviewed. Constitutional: He is oriented to person, place, and time. He appears well-developed and well-nourished. No distress.  HENT:  Head: Normocephalic and atraumatic.  Mouth/Throat: Oropharynx is clear and moist. No oropharyngeal exudate.  Eyes: EOM are normal. Pupils are equal, round, and reactive to light.  Neck: Normal range of motion. Neck supple.  Cardiovascular: Normal rate and regular  rhythm.   Pulmonary/Chest: Effort normal and breath sounds normal. No respiratory distress. He has no wheezes. He has no rales.  Abdominal: Soft. Bowel sounds are normal. There is no tenderness. There is no rebound and no guarding.  Musculoskeletal: Normal range of motion. He exhibits no edema and no tenderness.  Neurological: He is alert and oriented to person, place, and time.       5/5 motor in all ext, finger to nose intact. Mildly decreased sensation L face, chest, abd, arm and leg. Twitching noted to R face.   Stroke scale score of 1   Skin: Skin is warm and dry. No rash noted. No erythema.  Psychiatric: He has a normal mood and affect. His behavior is normal.    ED Course  Procedures (including critical care time)  Labs Reviewed  GLUCOSE, CAPILLARY - Abnormal; Notable for the following:    Glucose-Capillary 290 (*)    All other components within normal limits  DIFFERENTIAL - Abnormal; Notable for the following:    Neutrophils Relative 40 (*)    Lymphocytes Relative 48 (*)    All other components within normal limits  COMPREHENSIVE METABOLIC PANEL - Abnormal; Notable for the following:    Glucose, Bld 286 (*)    GFR calc non Af Amer 55 (*)    GFR calc Af Amer 64 (*)    All other components within normal limits  URINALYSIS, ROUTINE W REFLEX MICROSCOPIC - Abnormal; Notable for the following:    Glucose, UA >1000 (*)    All other components within normal limits  CBC  PROTIME-INR  APTT  CARDIAC PANEL(CRET KIN+CKTOT+MB+TROPI)  POCT I-STAT TROPONIN I  URINE MICROSCOPIC-ADD ON   Ct Head Wo Contrast  04/07/2012  *RADIOLOGY REPORT*  Clinical Data: Left-sided numbness and weakness.  Right sided facial twitching.  CT HEAD WITHOUT CONTRAST  Technique:  Contiguous axial images were obtained from the base of the skull through the vertex without contrast.  Comparison: CT of head 11/08/2008.  Findings: No definite acute intracranial abnormalities. Specifically, no definitive signs of  acute/subacute ischemia, no focal mass, mass effect, intracranial hemorrhage, hydrocephalus or other abnormal intra or extra-axial fluid collections. There are patchy areas of decreased attenuation throughout the deep and periventricular white matter of the cerebral hemispheres bilaterally, compatible with chronic microvascular ischemic changes (similar to prior study).  The no displaced skull fractures are identified.  Visualized paranasal sinuses and mastoids are generally well pneumatized, with exception of extensive mucoperiosteal thickening in the right mastoid sinus.  IMPRESSION: 1.  No acute intracranial abnormalities. 2.  Chronic microvascular ischemic changes in the cerebral white matter redemonstrated, as above. 3.  Changes compatible with chronic sinusitis in the right maxillary sinus again noted.  Original Report Authenticated By: Etheleen Mayhew, M.D.   Dg Chest Port 1 View  04/07/2012  *RADIOLOGY REPORT*  Clinical Data:  Numbness, history hypertension, diabetes, smoking  PORTABLE CHEST - 1 VIEW  Comparison: Portable exam 1702 hours compared to 11/08/2008  Findings: Minimal prominence of cardiac silhouette unchanged. Tortuous aorta. Pulmonary vascularity normal for technique. Lungs grossly clear. No pleural effusion or pneumothorax. Bones unremarkable.  IMPRESSION: No acute abnormalities.  Original Report Authenticated By: Burnetta Sabin, M.D.     1. Numbness   2. Abnormal EKG      Date: 04/07/2012  Rate: 78  Rhythm: normal sinus rhythm  QRS Axis: normal  Intervals: normal  ST/T Wave abnormalities: ST depressions inferiorly and ST depressions laterally  Conduction Disutrbances:none  Narrative Interpretation:   Old EKG Reviewed: Mild ST depression in inf and lat leads noted on EKG from 02/24/10.     MDM  Pt emphatically denies current chest pain.  Discussed with Dr Cheral Marker of neurology who will see and evaluate the pt. Discussed with Dr Celesta Aver who reviewed pt EKG and will see in  consult  Discussed with [facility] who will see and admit the patient      Julianne Rice, MD 04/07/12 1825

## 2012-04-07 NOTE — ED Notes (Signed)
Pt reports left-sided numbness and weakness starting at 15:50 with right-sided facial twitching. Denies any pain. Pt is A/O x4.

## 2012-04-08 ENCOUNTER — Other Ambulatory Visit: Payer: Self-pay

## 2012-04-08 DIAGNOSIS — G459 Transient cerebral ischemic attack, unspecified: Secondary | ICD-10-CM | POA: Diagnosis present

## 2012-04-08 DIAGNOSIS — IMO0002 Reserved for concepts with insufficient information to code with codable children: Secondary | ICD-10-CM

## 2012-04-08 DIAGNOSIS — I10 Essential (primary) hypertension: Secondary | ICD-10-CM

## 2012-04-08 DIAGNOSIS — E782 Mixed hyperlipidemia: Secondary | ICD-10-CM

## 2012-04-08 DIAGNOSIS — E118 Type 2 diabetes mellitus with unspecified complications: Secondary | ICD-10-CM

## 2012-04-08 DIAGNOSIS — E1165 Type 2 diabetes mellitus with hyperglycemia: Secondary | ICD-10-CM

## 2012-04-08 DIAGNOSIS — I517 Cardiomegaly: Secondary | ICD-10-CM

## 2012-04-08 LAB — LIPID PANEL
Cholesterol: 194 mg/dL (ref 0–200)
HDL: 64 mg/dL (ref >39)
LDL Cholesterol: 106 mg/dL — ABNORMAL HIGH (ref 0–99)
Total CHOL/HDL Ratio: 3 RATIO
Triglycerides: 120 mg/dL (ref <150)
VLDL: 24 mg/dL (ref 0–40)

## 2012-04-08 LAB — COMPREHENSIVE METABOLIC PANEL
ALT: 44 U/L (ref 0–53)
AST: 26 U/L (ref 0–37)
Albumin: 3.6 g/dL (ref 3.5–5.2)
Alkaline Phosphatase: 71 U/L (ref 39–117)
BUN: 15 mg/dL (ref 6–23)
CO2: 26 mEq/L (ref 19–32)
Calcium: 9.2 mg/dL (ref 8.4–10.5)
Chloride: 98 mEq/L (ref 96–112)
Creatinine, Ser: 1.05 mg/dL (ref 0.50–1.35)
GFR calc Af Amer: 86 mL/min — ABNORMAL LOW (ref >90)
GFR calc non Af Amer: 74 mL/min — ABNORMAL LOW (ref >90)
Glucose, Bld: 153 mg/dL — ABNORMAL HIGH (ref 70–99)
Potassium: 3.5 mEq/L (ref 3.5–5.1)
Sodium: 133 mEq/L — ABNORMAL LOW (ref 135–145)
Total Bilirubin: 0.9 mg/dL (ref 0.3–1.2)
Total Protein: 7.5 g/dL (ref 6.0–8.3)

## 2012-04-08 LAB — GLUCOSE, CAPILLARY
Glucose-Capillary: 272 mg/dL — ABNORMAL HIGH (ref 70–99)
Glucose-Capillary: 200 mg/dL — ABNORMAL HIGH (ref 70–99)
Glucose-Capillary: 144 mg/dL — ABNORMAL HIGH (ref 70–99)
Glucose-Capillary: 142 mg/dL — ABNORMAL HIGH (ref 70–99)

## 2012-04-08 LAB — CARDIAC PANEL(CRET KIN+CKTOT+MB+TROPI)
CK, MB: 2.2 ng/mL (ref 0.3–4.0)
CK, MB: 2.2 ng/mL (ref 0.3–4.0)
Relative Index: 1.3 (ref 0.0–2.5)
Relative Index: 1.4 (ref 0.0–2.5)
Total CK: 164 U/L (ref 7–232)
Total CK: 158 U/L (ref 7–232)
Troponin I: <0.3 ng/mL (ref <0.30)
Troponin I: <0.3 ng/mL (ref <0.30)

## 2012-04-08 LAB — HEMOGLOBIN A1C
Hgb A1c MFr Bld: 9 % — ABNORMAL HIGH (ref <5.7)
Mean Plasma Glucose: 212 mg/dL — ABNORMAL HIGH (ref <117)

## 2012-04-08 LAB — MAGNESIUM: Magnesium: 2 mg/dL (ref 1.5–2.5)

## 2012-04-08 MED ORDER — POTASSIUM CHLORIDE CRYS ER 20 MEQ PO TBCR
40.0000 meq | EXTENDED_RELEASE_TABLET | Freq: Once | ORAL | Status: AC
  Administered 2012-04-08: 40 meq via ORAL
  Filled 2012-04-08: qty 2

## 2012-04-08 MED ORDER — LISINOPRIL-HYDROCHLOROTHIAZIDE 20-12.5 MG PO TABS: 1.0000 | ORAL_TABLET | Freq: Every day | ORAL | Status: DC

## 2012-04-08 MED ORDER — INSULIN ASPART 100 UNIT/ML ~~LOC~~ SOLN
0.0000 [IU] | Freq: Three times a day (TID) | SUBCUTANEOUS | Status: DC
  Administered 2012-04-08: 2 [IU] via SUBCUTANEOUS
  Administered 2012-04-09 (×3): 3 [IU] via SUBCUTANEOUS
  Administered 2012-04-10: 5 [IU] via SUBCUTANEOUS

## 2012-04-08 MED ORDER — HYDRALAZINE HCL 20 MG/ML IJ SOLN
10.0000 mg | Freq: Four times a day (QID) | INTRAMUSCULAR | Status: DC | PRN
  Administered 2012-04-08 (×2): 10 mg via INTRAVENOUS
  Filled 2012-04-08 (×3): qty 1

## 2012-04-08 MED ORDER — HYDROCHLOROTHIAZIDE 12.5 MG PO CAPS
12.5000 mg | ORAL_CAPSULE | Freq: Every day | ORAL | Status: DC
  Administered 2012-04-08 – 2012-04-10 (×3): 12.5 mg via ORAL
  Filled 2012-04-08 (×3): qty 1

## 2012-04-08 MED ORDER — CARVEDILOL 6.25 MG PO TABS
6.2500 mg | ORAL_TABLET | Freq: Two times a day (BID) | ORAL | Status: DC
  Administered 2012-04-08 – 2012-04-09 (×3): 6.25 mg via ORAL
  Filled 2012-04-08 (×6): qty 1

## 2012-04-08 MED ORDER — ACETAMINOPHEN 325 MG PO TABS
650.0000 mg | ORAL_TABLET | ORAL | Status: DC | PRN
  Administered 2012-04-08 (×2): 650 mg via ORAL
  Filled 2012-04-08 (×2): qty 2

## 2012-04-08 MED ORDER — CLOPIDOGREL BISULFATE 75 MG PO TABS
75.0000 mg | ORAL_TABLET | Freq: Every day | ORAL | Status: DC
  Administered 2012-04-08 – 2012-04-10 (×3): 75 mg via ORAL
  Filled 2012-04-08 (×3): qty 1

## 2012-04-08 MED ORDER — LISINOPRIL 20 MG PO TABS
20.0000 mg | ORAL_TABLET | Freq: Every day | ORAL | Status: DC
  Administered 2012-04-08: 20 mg via ORAL
  Filled 2012-04-08 (×2): qty 1

## 2012-04-08 MED ORDER — INSULIN GLARGINE 100 UNIT/ML ~~LOC~~ SOLN
15.0000 [IU] | Freq: Every day | SUBCUTANEOUS | Status: DC
  Administered 2012-04-08 – 2012-04-09 (×2): 15 [IU] via SUBCUTANEOUS

## 2012-04-08 MED ORDER — SIMVASTATIN 40 MG PO TABS
40.0000 mg | ORAL_TABLET | Freq: Every day | ORAL | Status: DC
  Administered 2012-04-08 – 2012-04-09 (×2): 40 mg via ORAL
  Filled 2012-04-08 (×3): qty 1

## 2012-04-08 MED ORDER — POTASSIUM CHLORIDE 20 MEQ PO PACK: 40.0000 meq | PACK | Freq: Once | ORAL | Status: DC

## 2012-04-08 MED ORDER — INSULIN ASPART 100 UNIT/ML ~~LOC~~ SOLN
4.0000 [IU] | Freq: Three times a day (TID) | SUBCUTANEOUS | Status: DC
  Administered 2012-04-08 – 2012-04-10 (×5): 4 [IU] via SUBCUTANEOUS

## 2012-04-08 MED ORDER — CARVEDILOL 6.25 MG PO TABS
6.2500 mg | ORAL_TABLET | Freq: Two times a day (BID) | ORAL | Status: DC
  Filled 2012-04-08 (×2): qty 1

## 2012-04-08 MED ORDER — INSULIN ASPART 100 UNIT/ML ~~LOC~~ SOLN: 0.0000 [IU] | Freq: Every day | SUBCUTANEOUS | Status: DC

## 2012-04-08 NOTE — Evaluation (Signed)
Physical Therapy Evaluation Patient Details Name: [name] MRN: [id number] DOB: Jul 22, 1951 Today's Date: 04/08/2012 Time: [id number] PT Time Calculation (min): 11 min  PT Assessment / Plan / Recommendation Clinical Impression  61 yo male admitted with left sided numbness..likely TIA  He is independent in mobility and gait without device.  He does have some paresthesia in left leg from old orthopedic injurry Pt does not need follow up PT or DME     PT Assessment  Patent does not need any further PT services    Follow Up Recommendations  No PT follow up    Barriers to Discharge        lEquipment Recommendations  None recommended by PT    Recommendations for Other Services     Frequency      Precautions / Restrictions Precautions Precautions: None Restrictions Weight Bearing Restrictions: No   Pertinent Vitals/Pain Some discomfort in left knee and ankle from old orthopedic injury with ambulation      Mobility  Bed Mobility Bed Mobility: Supine to Sit;Sit to Supine Supine to Sit: 7: Independent Sit to Supine: 7: Independent Transfers Transfers: Sit to Stand;Stand to Sit Sit to Stand: 7: Independent Stand to Sit: 7: Independent Ambulation/Gait Ambulation/Gait Assistance: 7: Independent Ambulation Distance (Feet): 400 Feet Assistive device: None Ambulation/Gait Assistance Details: pt encouraged to have trunk extension in gait and use reciprocal arm swing Gait Pattern: Within Functional Limits Gait velocity: WFL pt able to vary speeds without balance interruption General Gait Details: WFL pt does have some minor decrease in left stance time that he attributes to  old injury. This does not impair functional independence Stairs: No Wheelchair Mobility Wheelchair Mobility: No Modified Rankin (Stroke Patients Only) Pre-Morbid Rankin Score: No symptoms Modified Rankin: No symptoms    Exercises Other Exercises Other Exercises: standing core isometrics Other  Exercises: standing hip abduction Other Exercises: unilateral stance balance training   PT Diagnosis:    PT Problem List:   PT Treatment Interventions:     PT Goals    Visit Information  Last PT Received On: 04/08/12 Assistance Needed: +1    Subjective Data  Subjective: I feel a lot better Patient Stated Goal: to go home tomorrow   Prior Functioning  Home Living Lives With: Spouse Available Help at Discharge: Family Type of Home: House Home Access: Stairs to enter Technical brewer of Steps: 2 Home Layout: Two level [place]: Straight cane Additional Comments: pt had previous OR to left knee/leg with residual numbness and tingling and occasional use of cane Prior Function Level of Independence: Independent Able to Take Stairs?: Yes Driving: Yes Communication Communication: No difficulties    Cognition  Overall Cognitive Status: Appears within functional limits for tasks assessed/performed Arousal/Alertness: Awake/alert Orientation Level: Oriented X4 / Intact Behavior During Session: [facility] for tasks performed    Extremity/Trunk Assessment Right Lower Extremity Assessment RLE ROM/Strength/Tone: Within functional levels RLE Sensation: WFL - Light Touch;WFL - Proprioception RLE Coordination: WFL - gross/fine motor Left Lower Extremity Assessment LLE ROM/Strength/Tone: Within functional levels LLE Sensation: Deficits LLE Sensation Deficits: defecits from previous orthopedic surgery pt states are unchanged from prior to admission LLE Coordination: Deficits LLE Coordination Deficits: mild latency with motor response when pt first starts to move.  Speed improves as he increases activity Trunk Assessment Trunk Assessment: Normal   Balance Balance Balance Assessed: Yes High Level Balance High Level Balance Activites: Side stepping;Braiding;Backward walking;Direction changes;Turns;Sudden stops;Head turns High Level Balance Comments: pt with no deficits  in these activities The  only observed deficit was balance loss in Romberg's test with eyes closed...  End of Session PT - End of Session Activity Tolerance: Patient tolerated treatment well;Other (comment) (Slight increase in BP with activity) Patient left: in bed;with family/visitor present Nurse Communication: Mobility status   Norwood Levo 04/08/2012, 2:24 PM

## 2012-04-08 NOTE — Progress Notes (Signed)
Patient ID: [name], male   DOB: [birth year], 61 y.o.   MRN: [id number]   Patient Name: Kodah Wingard Date of Encounter: 04/08/2012    SUBJECTIVE  No further CP since last week. Cardiac enzymes are negative. Echo pending  CURRENT MEDS    . aspirin  324 mg Oral Once  . carvedilol  3.125 mg Oral BID WC  . clopidogrel  75 mg Oral Daily  . heparin  5,000 Units Subcutaneous Q8H  . insulin aspart  0-15 Units Subcutaneous TID WC  . insulin glargine  5 Units Subcutaneous QHS  . simvastatin  20 mg Oral q1800  . sodium chloride  3 mL Intravenous Q12H  . DISCONTD: aspirin EC  325 mg Oral Daily  . DISCONTD: aspirin EC  325 mg Oral Daily    OBJECTIVE  Filed Vitals:   04/07/12 2300 04/07/12 2301 04/07/12 2302 04/08/12 0509  BP: 172/89 166/99 160/109 154/83  Pulse: 77 79 81 83  Temp:    97.8 F (36.6 C)  TempSrc:    Oral  Resp:    16  Height:      Weight:      SpO2:    99%    Intake/Output Summary (Last 24 hours) at 04/08/12 0836 Last data filed at 04/08/12 0500  Gross per 24 hour  Intake    423 ml  Output    350 ml  Net     73 ml   Filed Weights   04/07/12 2203  Weight: 215 lb 6.2 oz (97.7 kg)    PHYSICAL EXAM  General: Pleasant, NAD. Neuro: Alert and oriented X 3. Moves all extremities spontaneously. Psych: Normal affect. HEENT:  Normal  Neck: Supple without bruits or JVD. Lungs:  Resp regular and unlabored, CTA. Heart: RRR no s3, s4, or murmurs. Abdomen: Soft, non-tender, non-distended, BS + x 4.  Extremities: No clubbing, cyanosis or edema. DP/PT/Radials 2+ and equal bilaterally.  Accessory Clinical Findings  CBC  Basename 04/07/12 1702  WBC 7.1  NEUTROABS 2.9  HGB 15.1  HCT 43.8  MCV 89.4  PLT [id number]   Basic Metabolic Panel  Basename [id number] 04/07/12 1702  NA 133* 138  K 3.5 4.3  CL 98 99  CO2 26 32  GLUCOSE 153* 286*  BUN 15 19  CREATININE 1.05 1.34  CALCIUM 9.2 9.6  MG 2.0 --  PHOS -- --   Liver Function  Tests  Basename 04/08/12 0310 04/07/12 1702  AST 26 30  ALT 44 52  ALKPHOS 71 86  BILITOT 0.9 0.8  PROT 7.5 8.3  ALBUMIN 3.6 4.1   No results found for this basename: LIPASE:2,AMYLASE:2 in the last 72 hours Cardiac Enzymes  Basename 04/08/12 0310 04/07/12 1950 04/07/12 1702  CKTOTAL 164 167 145  CKMB 2.2 2.3 2.3  CKMBINDEX -- -- --  TROPONINI <0.30 <0.30 <0.30   BNP No components found with this basename: POCBNP:3 D-Dimer No results found for this basename: DDIMER:2 in the last 72 hours Hemoglobin A1C  Basename 04/07/12 2002  HGBA1C 9.0*   Fasting Lipid Panel  Basename 04/08/12 0310  CHOL 194  HDL 64  LDLCALC 106*  TRIG 120  CHOLHDL 3.0  LDLDIRECT --   Thyroid Function Tests No results found for this basename: TSH,T4TOTAL,FREET3,T3FREE,THYROIDAB in the last 72 hours  TELE  NSR  ECG    Radiology/Studies  Ct Head Wo Contrast  04/07/2012  *RADIOLOGY REPORT*  Clinical Data: Left-sided numbness and weakness.  Right sided facial twitching.  CT HEAD  WITHOUT CONTRAST  Technique:  Contiguous axial images were obtained from the base of the skull through the vertex without contrast.  Comparison: CT of head 11/08/2008.  Findings: No definite acute intracranial abnormalities. Specifically, no definitive signs of acute/subacute ischemia, no focal mass, mass effect, intracranial hemorrhage, hydrocephalus or other abnormal intra or extra-axial fluid collections. There are patchy areas of decreased attenuation throughout the deep and periventricular white matter of the cerebral hemispheres bilaterally, compatible with chronic microvascular ischemic changes (similar to prior study).  The no displaced skull fractures are identified.  Visualized paranasal sinuses and mastoids are generally well pneumatized, with exception of extensive mucoperiosteal thickening in the right mastoid sinus.  IMPRESSION: 1.  No acute intracranial abnormalities. 2.  Chronic microvascular ischemic changes  in the cerebral white matter redemonstrated, as above. 3.  Changes compatible with chronic sinusitis in the right maxillary sinus again noted.  Original Report Authenticated By: Etheleen Mayhew, M.D.   Mr Jodene Nam Head Wo Contrast  04/07/2012  *RADIOLOGY REPORT*  Clinical Data:  TIA.  Left sided numbness and weakness.  Right facial twitching.  Diabetes and high blood pressure  MRI HEAD WITHOUT AND WITH CONTRAST MRA HEAD WITHOUT CONTRAST MRA NECK WITHOUT AND WITH CONTRAST  Technique:  Multiplanar, multiecho pulse sequences of the brain and surrounding structures were obtained without and with intravenous contrast.  Angiographic images of the Circle of Willis were obtained using MRA technique without intravenous contrast. Angiographic images of the neck were obtained using MRA technique without and with intravenous contrast.  Carotid stenosis measurements (when applicable) are obtained utilizing NASCET criteria, using the distal internal carotid diameter as the denominator.  Contrast:  20 ml Multihance IV  Comparison:  CT head 04/07/2012  MRI HEAD  Findings:  Negative for acute infarct.  Moderate chronic microvascular ischemic changes in the cerebral white matter. Chronic ischemia in the pons bilaterally.  No cortical infarct. Chronic micro hemorrhage in the right occipital lobe and left medial basal ganglia.  No acute hemorrhage.  Postcontrast imaging reveals normal enhancement pattern.  No mass or leptomeningeal thickening.  Chronic sinusitis with mucosal thickening in the paranasal sinuses.  IMPRESSION: Chronic microvascular ischemic changes in the white matter and pons.  No acute infarct or mass.  Chronic sinusitis  MRA HEAD  Findings: Right vertebral artery is dominant and patent to the basilar without stenosis.  Left vertebral artery is small and ends in pica.  PICA is visualized bilaterally.  Superior cerebellar and posterior cerebral arteries are widely patent.  Internal carotid artery is patent bilaterally.   Hypoplastic right A1 segment.  Both anterior cerebral arteries are supplied from the left.  Anterior and middle cerebral arteries are patent bilaterally without stenosis.  Negative for aneurysm.  IMPRESSION:  Negative  MRA NECK  Findings: Carotid bifurcation is widely patent bilaterally.  No significant carotid stenosis.  Dominant right vertebral artery which is widely patent.  Left vertebral artery origin from the aorta.  Left vertebral artery is small and not well visualized but appears to be patent to PICA.  IMPRESSION: No significant carotid or vertebral stenosis.  Original Report Authenticated By: Truett Perna, M.D.   Mr Angiogram Neck W Wo Contrast  04/07/2012  *RADIOLOGY REPORT*  Clinical Data:  TIA.  Left sided numbness and weakness.  Right facial twitching.  Diabetes and high blood pressure  MRI HEAD WITHOUT AND WITH CONTRAST MRA HEAD WITHOUT CONTRAST MRA NECK WITHOUT AND WITH CONTRAST  Technique:  Multiplanar, multiecho pulse sequences of the brain and  surrounding structures were obtained without and with intravenous contrast.  Angiographic images of the Circle of Willis were obtained using MRA technique without intravenous contrast. Angiographic images of the neck were obtained using MRA technique without and with intravenous contrast.  Carotid stenosis measurements (when applicable) are obtained utilizing NASCET criteria, using the distal internal carotid diameter as the denominator.  Contrast:  20 ml Multihance IV  Comparison:  CT head 04/07/2012  MRI HEAD  Findings:  Negative for acute infarct.  Moderate chronic microvascular ischemic changes in the cerebral white matter. Chronic ischemia in the pons bilaterally.  No cortical infarct. Chronic micro hemorrhage in the right occipital lobe and left medial basal ganglia.  No acute hemorrhage.  Postcontrast imaging reveals normal enhancement pattern.  No mass or leptomeningeal thickening.  Chronic sinusitis with mucosal thickening in the paranasal  sinuses.  IMPRESSION: Chronic microvascular ischemic changes in the white matter and pons.  No acute infarct or mass.  Chronic sinusitis  MRA HEAD  Findings: Right vertebral artery is dominant and patent to the basilar without stenosis.  Left vertebral artery is small and ends in pica.  PICA is visualized bilaterally.  Superior cerebellar and posterior cerebral arteries are widely patent.  Internal carotid artery is patent bilaterally.  Hypoplastic right A1 segment.  Both anterior cerebral arteries are supplied from the left.  Anterior and middle cerebral arteries are patent bilaterally without stenosis.  Negative for aneurysm.  IMPRESSION:  Negative  MRA NECK  Findings: Carotid bifurcation is widely patent bilaterally.  No significant carotid stenosis.  Dominant right vertebral artery which is widely patent.  Left vertebral artery origin from the aorta.  Left vertebral artery is small and not well visualized but appears to be patent to PICA.  IMPRESSION: No significant carotid or vertebral stenosis.  Original Report Authenticated By: Truett Perna, M.D.   Dg Chest Port 1 View  04/07/2012  *RADIOLOGY REPORT*  Clinical Data: Numbness, history hypertension, diabetes, smoking  PORTABLE CHEST - 1 VIEW  Comparison: Portable exam 1702 hours compared to 11/08/2008  Findings: Minimal prominence of cardiac silhouette unchanged. Tortuous aorta. Pulmonary vascularity normal for technique. Lungs grossly clear. No pleural effusion or pneumothorax. Bones unremarkable.  IMPRESSION: No acute abnormalities.  Original Report Authenticated By: Burnetta Sabin, M.D.    ASSESSMENT AND PLAN   He has ruled out for MI. He has dramatic EKG changes. Will increase carvedilol to 6.25 bid and increase his statin with LDL >100. Echo pending but not urgent. Will follow.  Signed, Jenell Milliner MD

## 2012-04-08 NOTE — Progress Notes (Signed)
History: [name] is an 61 y.o. male with a history of hypertension, hyperlipidemia, coronary artery disease and insulin-dependent diabetes mellitus, presenting with acute onset of numbness involving left face arm and leg starting at 1550 04/07/12. There is a history of possible TIA in 2007. Patient has been taking aspirin 325 mg per day. CT scan of his head showed no acute intracranial abnormality. White matter chronic ischemic changes were noted, as well as indications of possible sinusitis. Patient's sensory symptoms have improved since he arrived in the emergency room, as has strength in the left side. There were no speech changes. NIH stroke score was 1 for mild residual sensory abnormalities on the left at the time of initial presentation.  LSN: [id number] on 04/07/2012  tPA Given: No: Mild focal deficits only  MRankin: 0  Subjective: Feeling ok, better. No difficulties with speech, has chronic numbness posterior left calf to heel due to old injury, unchanged.  Has experienced some chest discomfort, denies shortness of breath.    Objective: BP 160/103  Pulse 85  Temp(Src) 97.8 F (36.6 C) (Oral)  Resp 16  Ht 5\' 11"  (1.803 m)  Wt 97.7 kg (215 lb 6.2 oz)  BMI 30.04 kg/m2  SpO2 99%  CBGs  Basename 04/08/12 1154 04/08/12 0802 04/07/12 2245 04/07/12 1620  GLUCAP 200* 272* 150* 290*    Medications: Scheduled:   . aspirin  324 mg Oral Once  . carvedilol  6.25 mg Oral BID WC  . clopidogrel  75 mg Oral Daily  . heparin  5,000 Units Subcutaneous Q8H  . insulin aspart  0-15 Units Subcutaneous TID WC  . insulin glargine  5 Units Subcutaneous QHS  . lisinopril-hydrochlorothiazide  1 tablet Oral Daily  . potassium chloride  40 mEq Oral Once  . simvastatin  40 mg Oral q1800  . sodium chloride  3 mL Intravenous Q12H  . DISCONTD: aspirin EC  325 mg Oral Daily  . DISCONTD: aspirin EC  325 mg Oral Daily  . DISCONTD: carvedilol  3.125 mg Oral BID WC  . DISCONTD: carvedilol  6.25 mg  Oral BID WC  . DISCONTD: potassium chloride  40 mEq Oral Once  . DISCONTD: simvastatin  20 mg Oral q1800    Neurologic Exam: Mental Status: Alert, oriented, thought content appropriate.  Speech fluent without evidence of aphasia. Able to follow 3 step commands without difficulty. Cranial Nerves: II- Visual fields grossly intact. III/IV/VI-Extraocular movements intact.  Pupils reactive bilaterally. V/VII-Smile symmetric VIII-hearing grossly intact IX/X-normal gag XI-bilateral shoulder shrug XII-midline tongue extension Motor: 4+/5 L grip and LUE/ LLE.  5/5 R grip, RUE/RLE.   Sensory: Light touch intact throughout, except diinished light touch LLE below knee (chronic per patient.) Deep Tendon Reflexes: Slightly diminished LUE DTR compared to right, slightly increased reflex L patellar vs Right, symmetric achilles. Plantars: Downgoing bilaterally Cerebellar:  finger-to-nose without dysmetria, slightly slowed on the left UE compared to right.  Lab Results: Basic Metabolic Panel:  Lab [id number] 04/07/12 1702  NA 133* 138  K 3.5 4.3  CL 98 99  CO2 26 32  GLUCOSE 153* 286*  BUN 15 19  CREATININE 1.05 1.34  CALCIUM 9.2 9.6  MG 2.0 --  PHOS -- --   Liver Function Tests:  Lab 04/08/12 0310 04/07/12 1702  AST 26 30  ALT 44 52  ALKPHOS 71 86  BILITOT 0.9 0.8  PROT 7.5 8.3  ALBUMIN 3.6 4.1   CBC:  Lab 04/07/12 1702  WBC 7.1  NEUTROABS 2.9  HGB 15.1  HCT 43.8  MCV 89.4  PLT 202   Cardiac Enzymes:  Lab 04/08/12 1105 04/08/12 0310 04/07/12 1950  CKTOTAL 158 164 167  CKMB 2.2 2.2 2.3  CKMBINDEX -- -- --  TROPONINI <0.30 <0.30 <0.30   CBG:  Lab 04/08/12 1154 04/08/12 0802 04/07/12 2245 04/07/12 1620  GLUCAP 200* 272* 150* 290*   Hemoglobin A1C:  Lab 04/07/12 2002  HGBA1C 9.0*   Fasting Lipid Panel:  Lab 04/08/12 0310  CHOL 194  HDL 64  LDLCALC 106*  TRIG 120  CHOLHDL 3.0  LDLDIRECT --   Coagulation:  Lab 04/07/12 1702  LABPROT 13.3  INR 0.99    Urinalysis:  Lab 04/07/12 1648  COLORURINE YELLOW  LABSPEC 1.029  PHURINE 6.0  GLUCOSEU >1000*  HGBUR NEGATIVE  BILIRUBINUR NEGATIVE  KETONESUR NEGATIVE  PROTEINUR NEGATIVE  UROBILINOGEN 0.2  NITRITE NEGATIVE  LEUKOCYTESUR NEGATIVE   Misc. Labs  Study Results 04/07/2012  CT HEAD WITHOUT CONTRAST    Findings: No definite acute intracranial abnormalities. Specifically, no definitive signs of acute/subacute ischemia, no focal mass, mass effect, intracranial hemorrhage, hydrocephalus or other abnormal intra or extra-axial fluid collections. There are patchy areas of decreased attenuation throughout the deep and periventricular white matter of the cerebral hemispheres bilaterally, compatible with chronic microvascular ischemic changes (similar to prior study).  The no displaced skull fractures are identified.  Visualized paranasal sinuses and mastoids are generally well pneumatized, with exception of extensive mucoperiosteal thickening in the right mastoid sinus.  IMPRESSION: 1.  No acute intracranial abnormalities. 2.  Chronic microvascular ischemic changes in the cerebral white matter redemonstrated, as above. 3.  Changes compatible with chronic sinusitis in the right maxillary sinus again noted.   Etheleen Mayhew, M.D   04/07/2012  MRI HEAD  Findings:  Negative for acute infarct.  Moderate chronic microvascular ischemic changes in the cerebral white matter. Chronic ischemia in the pons bilaterally.  No cortical infarct. Chronic micro hemorrhage in the right occipital lobe and left medial basal ganglia.  No acute hemorrhage.  Postcontrast imaging reveals normal enhancement pattern.  No mass or leptomeningeal thickening.  Chronic sinusitis with mucosal thickening in the paranasal sinuses.  IMPRESSION: Chronic microvascular ischemic changes in the white matter and pons.  No acute infarct or mass.  Chronic sinusitis  Truett Perna, M.D   04/07/2012 MRA HEAD  Findings: Right vertebral artery is  dominant and patent to the basilar without stenosis.  Left vertebral artery is small and ends in pica.  PICA is visualized bilaterally.  Superior cerebellar and posterior cerebral arteries are widely patent.  Internal carotid artery is patent bilaterally.  Hypoplastic right A1 segment.  Both anterior cerebral arteries are supplied from the left.  Anterior and middle cerebral arteries are patent bilaterally without stenosis.  Negative for aneurysm.  IMPRESSION:  Negative   Truett Perna, M.D.    04/07/2012 MRA NECK  Findings: Carotid bifurcation is widely patent bilaterally.  No significant carotid stenosis.  Dominant right vertebral artery which is widely patent.  Left vertebral artery origin from the aorta.  Left vertebral artery is small and not well visualized but appears to be patent to PICA.  IMPRESSION: No significant carotid or vertebral stenosis.   Truett Perna, M.D.   04/07/2012 PORTABLE CHEST - 1 VIEW Findings: Minimal prominence of cardiac silhouette unchanged. Tortuous aorta. Pulmonary vascularity normal for technique. Lungs grossly clear. No pleural effusion or pneumothorax. Bones unremarkable.  IMPRESSION: No acute abnormalities.  Burnetta Sabin, M.D.  Assessment: 61 yo aam with history of hypertension, hyperlipidemia, coronary artery disease and insulin-dependent diabetes mellitus, presenting with acute onset of numbness involving left face, arm and leg.  Head CT, MRI/ MRA head negative for acute infarct, bleed or mass.  His MRI is positive, however, for two chronic microbleeds and chronic small vessel ischemic changes. Presently, patient at baseline, has chronic sensory disturbance LLE from remote injury.  Given resolution of sx and negative imaging for acute findings, suspect TIA.  Had long discussion with the patient about aggressive risk factor modification to try to prevent recurrent or worse events.  Patient verbalizes understanding.    Chest pain; atypical.  Has been seen by  cardiology and ruled out for MI.  Plan: 1. 2D echo completed, report pending. 2. Aggressive risk factor modification including BP and serum glucose control.  3. Continue plavix for stroke prophylaxis. Discontinue ASA as there is no neurological indication for combination antiplatelet therapy in this patient.  4.  No further neurologic intervention is recommended at this time.  If further questions arise, please call or page at that time.  Thank you for allowing neurology to participate in the care of this patient.  Patient has been seen and examined by Dr. Cheral Marker who recommends above.    LOS: 1 day   Geraldine Solar [facility] [id number] 04/08/2012  1:42 PM  Electronically signed: Dr. Kerney Elbe

## 2012-04-08 NOTE — Progress Notes (Signed)
  Echocardiogram 2D Echocardiogram has been performed.  Brittley Regner J 04/08/2012, 11:27 AM

## 2012-04-08 NOTE — Progress Notes (Signed)
Subjective:   Chart reviewed. Mild headache this morning. No facial asymmetry or slurred speech. Left-sided numbness and weakness that brought him to the hospital have significantly improved. He indicates that he has some numbness and weakness in his left leg but gives history of chronic similar symptoms in that leg post injury. No chest pain.  Objective  Vital signs in last 24 hours: Filed Vitals:   04/07/12 2302 04/08/12 0509 04/08/12 0900 04/08/12 1223  BP: 160/109 154/83 174/106 160/103  Pulse: 81 83 85   Temp:  97.8 F (36.6 C)    TempSrc:  Oral    Resp:  16 16   Height:      Weight:      SpO2:  99%     Weight change:   Intake/Output Summary (Last 24 hours) at 04/08/12 1331 Last data filed at 04/08/12 0500  Gross per 24 hour  Intake    423 ml  Output    350 ml  Net     73 ml    Physical Exam:  General Exam: Comfortable.  Respiratory System: Clear. No increased work of breathing.  Cardiovascular System: First and second heart sounds heard. Regular rate and rhythm. No JVD/murmurs.  Gastrointestinal System: Abdomen is non distended, soft and normal bowel sounds heard.  Central Nervous System: Alert and oriented. No focal neurological deficits. Extremities:? Grade 4 x 5 power in the left limbs (lower extremity seems to be weaker than the upper extremity) and some patchy sensory deficit in the left leg. Other limbs are grade 5 x 5 power.  Labs:  Basic Metabolic Panel:  Lab [id number] 0310 04/07/12 1702  NA 133* 138  K 3.5 4.3  CL 98 99  CO2 26 32  GLUCOSE 153* 286*  BUN 15 19  CREATININE 1.05 1.34  CALCIUM 9.2 9.6  ALB -- --  PHOS -- --   Liver Function Tests:  Lab 04/08/12 0310 04/07/12 1702  AST 26 30  ALT 44 52  ALKPHOS 71 86  BILITOT 0.9 0.8  PROT 7.5 8.3  ALBUMIN 3.6 4.1   No results found for this basename: LIPASE:3,AMYLASE:3 in the last 168 hours No results found for this basename: AMMONIA:3 in the last 168 hours CBC:  Lab 04/07/12 1702  WBC  7.1  NEUTROABS 2.9  HGB 15.1  HCT 43.8  MCV 89.4  PLT 202   Cardiac Enzymes:  Lab 04/08/12 1105 04/08/12 0310 04/07/12 1950 04/07/12 1702  CKTOTAL 158 164 167 145  CKMB 2.2 2.2 2.3 2.3  CKMBINDEX -- -- -- --  TROPONINI <0.30 <0.30 <0.30 <0.30   CBG:  Lab 04/08/12 1154 04/08/12 0802 04/07/12 2245 04/07/12 1620  GLUCAP 200* 272* 150* 290*    Iron Studies: No results found for this basename: IRON,TIBC,TRANSFERRIN,FERRITIN in the last 72 hours Studies/Results: Ct Head Wo Contrast  04/07/2012  *RADIOLOGY REPORT*  Clinical Data: Left-sided numbness and weakness.  Right sided facial twitching.  CT HEAD WITHOUT CONTRAST  Technique:  Contiguous axial images were obtained from the base of the skull through the vertex without contrast.  Comparison: CT of head 11/08/2008.  Findings: No definite acute intracranial abnormalities. Specifically, no definitive signs of acute/subacute ischemia, no focal mass, mass effect, intracranial hemorrhage, hydrocephalus or other abnormal intra or extra-axial fluid collections. There are patchy areas of decreased attenuation throughout the deep and periventricular white matter of the cerebral hemispheres bilaterally, compatible with chronic microvascular ischemic changes (similar to prior study).  The no displaced skull fractures are identified.  Visualized paranasal sinuses and mastoids are generally well pneumatized, with exception of extensive mucoperiosteal thickening in the right mastoid sinus.  IMPRESSION: 1.  No acute intracranial abnormalities. 2.  Chronic microvascular ischemic changes in the cerebral white matter redemonstrated, as above. 3.  Changes compatible with chronic sinusitis in the right maxillary sinus again noted.  Original Report Authenticated By: Etheleen Mayhew, M.D.   Mr Jodene Nam Head Wo Contrast  04/07/2012  *RADIOLOGY REPORT*  Clinical Data:  TIA.  Left sided numbness and weakness.  Right facial twitching.  Diabetes and high blood pressure   MRI HEAD WITHOUT AND WITH CONTRAST MRA HEAD WITHOUT CONTRAST MRA NECK WITHOUT AND WITH CONTRAST  Technique:  Multiplanar, multiecho pulse sequences of the brain and surrounding structures were obtained without and with intravenous contrast.  Angiographic images of the Circle of Willis were obtained using MRA technique without intravenous contrast. Angiographic images of the neck were obtained using MRA technique without and with intravenous contrast.  Carotid stenosis measurements (when applicable) are obtained utilizing NASCET criteria, using the distal internal carotid diameter as the denominator.  Contrast:  20 ml Multihance IV  Comparison:  CT head 04/07/2012  MRI HEAD  Findings:  Negative for acute infarct.  Moderate chronic microvascular ischemic changes in the cerebral white matter. Chronic ischemia in the pons bilaterally.  No cortical infarct. Chronic micro hemorrhage in the right occipital lobe and left medial basal ganglia.  No acute hemorrhage.  Postcontrast imaging reveals normal enhancement pattern.  No mass or leptomeningeal thickening.  Chronic sinusitis with mucosal thickening in the paranasal sinuses.  IMPRESSION: Chronic microvascular ischemic changes in the white matter and pons.  No acute infarct or mass.  Chronic sinusitis  MRA HEAD  Findings: Right vertebral artery is dominant and patent to the basilar without stenosis.  Left vertebral artery is small and ends in pica.  PICA is visualized bilaterally.  Superior cerebellar and posterior cerebral arteries are widely patent.  Internal carotid artery is patent bilaterally.  Hypoplastic right A1 segment.  Both anterior cerebral arteries are supplied from the left.  Anterior and middle cerebral arteries are patent bilaterally without stenosis.  Negative for aneurysm.  IMPRESSION:  Negative  MRA NECK  Findings: Carotid bifurcation is widely patent bilaterally.  No significant carotid stenosis.  Dominant right vertebral artery which is widely patent.   Left vertebral artery origin from the aorta.  Left vertebral artery is small and not well visualized but appears to be patent to PICA.  IMPRESSION: No significant carotid or vertebral stenosis.  Original Report Authenticated By: Truett Perna, M.D.   Dg Chest Port 1 View  04/07/2012  *RADIOLOGY REPORT*  Clinical Data: Numbness, history hypertension, diabetes, smoking  PORTABLE CHEST - 1 VIEW  Comparison: Portable exam 1702 hours compared to 11/08/2008  Findings: Minimal prominence of cardiac silhouette unchanged. Tortuous aorta. Pulmonary vascularity normal for technique. Lungs grossly clear. No pleural effusion or pneumothorax. Bones unremarkable.  IMPRESSION: No acute abnormalities.  Original Report Authenticated By: Burnetta Sabin, M.D.   Medications:      . aspirin  324 mg Oral Once  . carvedilol  6.25 mg Oral BID WC  . clopidogrel  75 mg Oral Daily  . heparin  5,000 Units Subcutaneous Q8H  . insulin aspart  0-15 Units Subcutaneous TID WC  . insulin glargine  5 Units Subcutaneous QHS  . potassium chloride  40 mEq Oral Once  . simvastatin  40 mg Oral q1800  . sodium chloride  3 mL  Intravenous Q12H  . DISCONTD: aspirin EC  325 mg Oral Daily  . DISCONTD: aspirin EC  325 mg Oral Daily  . DISCONTD: carvedilol  3.125 mg Oral BID WC  . DISCONTD: carvedilol  6.25 mg Oral BID WC  . DISCONTD: potassium chloride  40 mEq Oral Once  . DISCONTD: simvastatin  20 mg Oral q1800    I  have reviewed scheduled and prn medications.     Problem/Plan: Principal Problem:  *TIA (transient ischemic attack) Active Problems:  DIABETES MELLITUS, TYPE II, UNCONTROLLED  HYPERLIPIDEMIA-MIXED  HYPERTENSION, BENIGN  CAD, NATIVE VESSEL  1. TIA: Workup negative for stroke. Aim to control secondary risk factors such as diabetes, hypertension and hyperlipidemia. Neurology consultation appreciated. Change to aspirin to Plavix. Will get PT and OT evaluation. Followup 2-D echocardiogram. 2. Uncontrolled type 2  diabetes mellitus: Increase the bedtime Lantus to 15 units each bedtime and add NovoLog mealtime insulin 4 units 3 times a day with meals and monitor. 3. Uncontrolled hypertension: Continue carvedilol. Resume patient's home Zestoretic. Monitor. 4. Dyslipidemia: Increasing statins. 5. Abnormal EKG in patient with history of coronary artery disease status post PCI is. Ruled out for acute MI. No further chest pain. Cardiology input appreciated. Continue Plavix, statins and carvedilol. Followup 2-D echo.  Discussed with patient and patient's spouse at bedside and updated care.  Alfred Eckley 04/08/2012,1:31 PM  LOS: 1 day

## 2012-04-09 DIAGNOSIS — E1165 Type 2 diabetes mellitus with hyperglycemia: Secondary | ICD-10-CM

## 2012-04-09 DIAGNOSIS — E782 Mixed hyperlipidemia: Secondary | ICD-10-CM

## 2012-04-09 DIAGNOSIS — G459 Transient cerebral ischemic attack, unspecified: Secondary | ICD-10-CM

## 2012-04-09 DIAGNOSIS — I10 Essential (primary) hypertension: Secondary | ICD-10-CM

## 2012-04-09 DIAGNOSIS — E118 Type 2 diabetes mellitus with unspecified complications: Secondary | ICD-10-CM

## 2012-04-09 LAB — GLUCOSE, CAPILLARY
Glucose-Capillary: 167 mg/dL — ABNORMAL HIGH (ref 70–99)
Glucose-Capillary: 165 mg/dL — ABNORMAL HIGH (ref 70–99)
Glucose-Capillary: 166 mg/dL — ABNORMAL HIGH (ref 70–99)
Glucose-Capillary: 133 mg/dL — ABNORMAL HIGH (ref 70–99)

## 2012-04-09 MED ORDER — LISINOPRIL 40 MG PO TABS
40.0000 mg | ORAL_TABLET | Freq: Every day | ORAL | Status: DC
  Administered 2012-04-09 – 2012-04-10 (×2): 40 mg via ORAL
  Filled 2012-04-09 (×2): qty 1

## 2012-04-09 MED ORDER — CARVEDILOL 12.5 MG PO TABS
12.5000 mg | ORAL_TABLET | Freq: Two times a day (BID) | ORAL | Status: DC
  Administered 2012-04-09 – 2012-04-10 (×2): 12.5 mg via ORAL
  Filled 2012-04-09 (×4): qty 1

## 2012-04-09 NOTE — Progress Notes (Signed)
Subjective:   New onset left-sided numbness and weakness have resolved. Denies chest pain. Complains of right-sided nasal stuffiness.  Objective  Vital signs in last 24 hours: Filed Vitals:   04/08/12 1731 04/08/12 2116 04/09/12 0559 04/09/12 0740  BP: 152/89 150/82 146/86 157/94  Pulse:  76 80 76  Temp:  97.6 F (36.4 C) 98.9 F (37.2 C)   TempSrc:  Oral Oral   Resp:  18 18   Height:      Weight:      SpO2:  99% 98%    Weight change:   Intake/Output Summary (Last 24 hours) at 04/09/12 1242 Last data filed at 04/08/12 1747  Gross per 24 hour  Intake    240 ml  Output      0 ml  Net    240 ml    Physical Exam:  General Exam: Comfortable.  Respiratory System: Clear. No increased work of breathing.  Cardiovascular System: First and second heart sounds heard. Regular rate and rhythm. No JVD/murmurs. Telemetry shows sinus rhythm in the 70s. Gastrointestinal System: Abdomen is non distended, soft and normal bowel sounds heard. Nontender. Central Nervous System: Alert and oriented. No focal neurological deficits. Extremities: Grade 4+/5 left leg otherwise symmetric 5 x 5 power. No sensory deficits.  Labs:  Basic Metabolic Panel:  Lab [id number] 0310 04/07/12 1702  NA 133* 138  K 3.5 4.3  CL 98 99  CO2 26 32  GLUCOSE 153* 286*  BUN 15 19  CREATININE 1.05 1.34  CALCIUM 9.2 9.6  ALB -- --  PHOS -- --   Liver Function Tests:  Lab 04/08/12 0310 04/07/12 1702  AST 26 30  ALT 44 52  ALKPHOS 71 86  BILITOT 0.9 0.8  PROT 7.5 8.3  ALBUMIN 3.6 4.1   No results found for this basename: LIPASE:3,AMYLASE:3 in the last 168 hours No results found for this basename: AMMONIA:3 in the last 168 hours CBC:  Lab 04/07/12 1702  WBC 7.1  NEUTROABS 2.9  HGB 15.1  HCT 43.8  MCV 89.4  PLT 202   Cardiac Enzymes:  Lab 04/08/12 1105 04/08/12 0310 04/07/12 1950 04/07/12 1702  CKTOTAL 158 164 167 145  CKMB 2.2 2.2 2.3 2.3  CKMBINDEX -- -- -- --  TROPONINI <0.30 <0.30 <0.30  <0.30   CBG:  Lab 04/09/12 1200 04/09/12 0740 04/08/12 2114 04/08/12 1626 04/08/12 1154  GLUCAP 165* 167* 142* 144* 200*    Iron Studies: No results found for this basename: IRON,TIBC,TRANSFERRIN,FERRITIN in the last 72 hours Studies/Results: Ct Head Wo Contrast  04/07/2012  *RADIOLOGY REPORT*  Clinical Data: Left-sided numbness and weakness.  Right sided facial twitching.  CT HEAD WITHOUT CONTRAST  Technique:  Contiguous axial images were obtained from the base of the skull through the vertex without contrast.  Comparison: CT of head 11/08/2008.  Findings: No definite acute intracranial abnormalities. Specifically, no definitive signs of acute/subacute ischemia, no focal mass, mass effect, intracranial hemorrhage, hydrocephalus or other abnormal intra or extra-axial fluid collections. There are patchy areas of decreased attenuation throughout the deep and periventricular white matter of the cerebral hemispheres bilaterally, compatible with chronic microvascular ischemic changes (similar to prior study).  The no displaced skull fractures are identified.  Visualized paranasal sinuses and mastoids are generally well pneumatized, with exception of extensive mucoperiosteal thickening in the right mastoid sinus.  IMPRESSION: 1.  No acute intracranial abnormalities. 2.  Chronic microvascular ischemic changes in the cerebral white matter redemonstrated, as above. 3.  Changes compatible with  chronic sinusitis in the right maxillary sinus again noted.  Original Report Authenticated By: Etheleen Mayhew, M.D.   Mr Jodene Nam Head Wo Contrast  04/07/2012  *RADIOLOGY REPORT*  Clinical Data:  TIA.  Left sided numbness and weakness.  Right facial twitching.  Diabetes and high blood pressure  MRI HEAD WITHOUT AND WITH CONTRAST MRA HEAD WITHOUT CONTRAST MRA NECK WITHOUT AND WITH CONTRAST  Technique:  Multiplanar, multiecho pulse sequences of the brain and surrounding structures were obtained without and with intravenous  contrast.  Angiographic images of the Circle of Willis were obtained using MRA technique without intravenous contrast. Angiographic images of the neck were obtained using MRA technique without and with intravenous contrast.  Carotid stenosis measurements (when applicable) are obtained utilizing NASCET criteria, using the distal internal carotid diameter as the denominator.  Contrast:  20 ml Multihance IV  Comparison:  CT head 04/07/2012  MRI HEAD  Findings:  Negative for acute infarct.  Moderate chronic microvascular ischemic changes in the cerebral white matter. Chronic ischemia in the pons bilaterally.  No cortical infarct. Chronic micro hemorrhage in the right occipital lobe and left medial basal ganglia.  No acute hemorrhage.  Postcontrast imaging reveals normal enhancement pattern.  No mass or leptomeningeal thickening.  Chronic sinusitis with mucosal thickening in the paranasal sinuses.  IMPRESSION: Chronic microvascular ischemic changes in the white matter and pons.  No acute infarct or mass.  Chronic sinusitis  MRA HEAD  Findings: Right vertebral artery is dominant and patent to the basilar without stenosis.  Left vertebral artery is small and ends in pica.  PICA is visualized bilaterally.  Superior cerebellar and posterior cerebral arteries are widely patent.  Internal carotid artery is patent bilaterally.  Hypoplastic right A1 segment.  Both anterior cerebral arteries are supplied from the left.  Anterior and middle cerebral arteries are patent bilaterally without stenosis.  Negative for aneurysm.  IMPRESSION:  Negative  MRA NECK  Findings: Carotid bifurcation is widely patent bilaterally.  No significant carotid stenosis.  Dominant right vertebral artery which is widely patent.  Left vertebral artery origin from the aorta.  Left vertebral artery is small and not well visualized but appears to be patent to PICA.  IMPRESSION: No significant carotid or vertebral stenosis.  Original Report Authenticated By:  Truett Perna, M.D.   Dg Chest Port 1 View  04/07/2012  *RADIOLOGY REPORT*  Clinical Data: Numbness, history hypertension, diabetes, smoking  PORTABLE CHEST - 1 VIEW  Comparison: Portable exam 1702 hours compared to 11/08/2008  Findings: Minimal prominence of cardiac silhouette unchanged. Tortuous aorta. Pulmonary vascularity normal for technique. Lungs grossly clear. No pleural effusion or pneumothorax. Bones unremarkable.  IMPRESSION: No acute abnormalities.  Original Report Authenticated By: Burnetta Sabin, M.D.   2-D echocardiogram: Showed mild LVH, LVEF of 55-60% and grade 1 diastolic dysfunction.   Medications:      . carvedilol  6.25 mg Oral BID WC  . clopidogrel  75 mg Oral Daily  . heparin  5,000 Units Subcutaneous Q8H  . hydrochlorothiazide  12.5 mg Oral Daily  . insulin aspart  0-15 Units Subcutaneous TID WC  . insulin aspart  0-5 Units Subcutaneous QHS  . insulin aspart  4 Units Subcutaneous TID WC  . insulin glargine  15 Units Subcutaneous QHS  . lisinopril  40 mg Oral Daily  . simvastatin  40 mg Oral q1800  . sodium chloride  3 mL Intravenous Q12H  . DISCONTD: insulin aspart  0-15 Units Subcutaneous TID WC  .  DISCONTD: insulin glargine  5 Units Subcutaneous QHS  . DISCONTD: lisinopril  20 mg Oral Daily  . DISCONTD: lisinopril-hydrochlorothiazide  1 tablet Oral Daily    I  have reviewed scheduled and prn medications.     Problem/Plan: Principal Problem:  *TIA (transient ischemic attack) Active Problems:  DIABETES MELLITUS, TYPE II, UNCONTROLLED  HYPERLIPIDEMIA-MIXED  HYPERTENSION, BENIGN  CAD, NATIVE VESSEL  1. TIA: Workup negative for stroke. Aim to control secondary risk factors such as diabetes, hypertension and hyperlipidemia. Neurology consultation appreciated. Changed aspirin to Plavix. Symptoms resolved. 2. Uncontrolled type 2 diabetes mellitus: Increase the bedtime Lantus to 15 units each bedtime and add NovoLog mealtime insulin 4 units 3 times a day  with meals and monitor. Improved control. Counseled regarding compliance with diet, medications and M.D. followups. 3. Uncontrolled hypertension: Improved control but still elevated. Will increase carvedilol to prior home dose of 12.5 MG twice a day. Lisinopril increased to 40 mg daily. Continue HCTZ 12.5 mg daily. 4. Dyslipidemia: Increasing statins. 5. Abnormal EKG in patient with history of coronary artery disease status post PCI is. Ruled out for acute MI. No further chest pain. Cardiology input appreciated. Continue Plavix, statins and carvedilol. Planned for Myoview tomorrow.   Discussed with patient and patient's spouse at bedside and updated care.  Harald Quevedo 04/09/2012,12:42 PM  LOS: 2 days

## 2012-04-09 NOTE — Progress Notes (Signed)
Patient ID: [name], male   DOB: 1950/12/07, 61 y.o.   MRN: [id number] His Echocardiogram is normal except for mild LVH. BP still not well controlled. Heart rate is better with increase in Carvedilol. I have discussed with patient the need to rule out obstructive CAD. Will schedule for Myoview in am. Will increase lisinopril to 40 mg each morning.

## 2012-04-10 ENCOUNTER — Ambulatory Visit (HOSPITAL_COMMUNITY)
Admission: RE | Admit: 2012-04-10 | Discharge: 2012-04-10 | Disposition: A | Discharge status: Home / Self Care | Payer: Medicare Other | Source: Ambulatory Visit | Attending: Cardiology | Admitting: Cardiology

## 2012-04-10 ENCOUNTER — Inpatient Hospital Stay (HOSPITAL_COMMUNITY)
Admission: RE | Admit: 2012-04-10 | Discharge: 2012-04-10 | Disposition: A | Discharge status: Home / Self Care | Payer: Medicare Other | Source: Ambulatory Visit | Attending: Cardiology | Admitting: Cardiology

## 2012-04-10 DIAGNOSIS — G459 Transient cerebral ischemic attack, unspecified: Secondary | ICD-10-CM

## 2012-04-10 DIAGNOSIS — E782 Mixed hyperlipidemia: Secondary | ICD-10-CM

## 2012-04-10 DIAGNOSIS — E118 Type 2 diabetes mellitus with unspecified complications: Secondary | ICD-10-CM

## 2012-04-10 DIAGNOSIS — E1165 Type 2 diabetes mellitus with hyperglycemia: Secondary | ICD-10-CM

## 2012-04-10 DIAGNOSIS — R079 Chest pain, unspecified: Secondary | ICD-10-CM

## 2012-04-10 DIAGNOSIS — I10 Essential (primary) hypertension: Secondary | ICD-10-CM

## 2012-04-10 DIAGNOSIS — I2510 Atherosclerotic heart disease of native coronary artery without angina pectoris: Secondary | ICD-10-CM

## 2012-04-10 LAB — GLUCOSE, CAPILLARY
Glucose-Capillary: 159 mg/dL — ABNORMAL HIGH (ref 70–99)
Glucose-Capillary: 225 mg/dL — ABNORMAL HIGH (ref 70–99)

## 2012-04-10 MED ORDER — INSULIN GLARGINE 100 UNIT/ML ~~LOC~~ SOLN: 15.0000 [IU] | Freq: Every day | SUBCUTANEOUS | Status: DC

## 2012-04-10 MED ORDER — CLOPIDOGREL BISULFATE 75 MG PO TABS: 75.0000 mg | ORAL_TABLET | Freq: Every day | ORAL | Status: DC

## 2012-04-10 MED ORDER — INSULIN ASPART 100 UNIT/ML ~~LOC~~ SOLN: 4.0000 [IU] | Freq: Three times a day (TID) | SUBCUTANEOUS | Status: DC

## 2012-04-10 MED ORDER — REGADENOSON 0.4 MG/5ML IV SOLN
0.4000 mg | Freq: Once | INTRAVENOUS | Status: AC
  Administered 2012-04-10: 0.4 mg via INTRAVENOUS

## 2012-04-10 MED ORDER — ACETAMINOPHEN 325 MG PO TABS: 650.0000 mg | ORAL_TABLET | Freq: Once | ORAL | Status: DC

## 2012-04-10 MED ORDER — POTASSIUM CHLORIDE CRYS ER 20 MEQ PO TBCR
40.0000 meq | EXTENDED_RELEASE_TABLET | Freq: Once | ORAL | Status: DC
  Filled 2012-04-10: qty 2

## 2012-04-10 MED ORDER — HYDROCHLOROTHIAZIDE 12.5 MG PO CAPS: 12.5000 mg | ORAL_CAPSULE | Freq: Every day | ORAL | Status: DC

## 2012-04-10 MED ORDER — PRAVASTATIN SODIUM 40 MG PO TABS: 40.0000 mg | ORAL_TABLET | Freq: Every day | ORAL | Status: DC

## 2012-04-10 MED ORDER — TECHNETIUM TC 99M TETROFOSMIN IV KIT
10.0000 | PACK | Freq: Once | INTRAVENOUS | Status: AC | PRN
  Administered 2012-04-10: 10 via INTRAVENOUS

## 2012-04-10 MED ORDER — LISINOPRIL 40 MG PO TABS: 40.0000 mg | ORAL_TABLET | Freq: Every day | ORAL | Status: DC

## 2012-04-10 MED ORDER — TECHNETIUM TC 99M TETROFOSMIN IV KIT
30.0000 | PACK | Freq: Once | INTRAVENOUS | Status: AC | PRN
  Administered 2012-04-10: 30 via INTRAVENOUS

## 2012-04-10 NOTE — Progress Notes (Signed)
Nuc reviewed with Dr. Angelena Form - global HK, EF 47% but no evidence of ischemia. He had echo this admission demonstrating normal EF 55-60%. This is felt to be a low-risk myoview. Patient is okay for d/c from cardiac standpoint; our office will call him with appt with Dr. Acie Fredrickson. Dr. Algis Liming was paged with message.  Zaquan Duffner PA-C

## 2012-04-10 NOTE — Progress Notes (Signed)
   CARE MANAGEMENT NOTE 04/10/2012  Patient:  [name], [name]   Account Number:  [id number]  Date Initiated:  04/10/2012  Documentation initiated by:  Olga Coaster  Subjective/Objective Assessment:   ADMITTED WITH LEFT SIDED NUMBNESS     Action/Plan:   PCP: [name], MD; LIVES AT [place]   Anticipated DC Date:  04/17/2012   Anticipated DC Plan:  [place]          Status of service:  In process, will continue to follow Medicare Important Message given?   (If response is "NO", the following Medicare IM given date fields will be blank)  Per UR Regulation:  Reviewed for med. necessity/level of care/duration of stay  Comments:  04/10/2012- B Cataldo Cosgriff RN, BSN, MHA

## 2012-04-10 NOTE — Discharge Summary (Signed)
Discharge Summary  [name] MR#: [id number]  DOB:22-Oct-1951  Date of Admission: 04/07/2012 Date of Discharge: 04/10/2012  Patient's PCP: [name], MD, MD  Attending Physician:Prima Rayner  Consults: Cardiology: Dr. Mertie Moores  Discharge Diagnoses: Principal Problem:  *TIA (transient ischemic attack) Active Problems:  DIABETES MELLITUS, TYPE II, UNCONTROLLED  HYPERLIPIDEMIA-MIXED  HYPERTENSION, BENIGN  CAD, NATIVE VESSEL  Abnormal EKG   Brief Admitting History and Physical 61 y.o. male with a history of hypertension, hyperlipidemia, coronary artery disease and insulin-dependent diabetes mellitus, presented with acute onset of numbness involving left half of his body with associated weakness which started at 1550 on the day of admission. He was waiting in his car at [place] to pick up his daughter. He drove himself to the emergency department. There is a history of possible TIA in 2007. Patient has been taking aspirin 325 mg per day. CT scan of his head showed no acute intracranial abnormality. He also has history of coronary stenting in the past. He gave history of one or 2 episodes of chest pain in the last week which resolved spontaneously. The hospitalist service was requested to admit for further evaluation and management.   Discharge Medications Current Discharge Medication List    START taking these medications   Details  clopidogrel (PLAVIX) 75 MG tablet Take 1 tablet (75 mg total) by mouth daily. Qty: 30 tablet, Refills: 0    hydrochlorothiazide (MICROZIDE) 12.5 MG capsule Take 1 capsule (12.5 mg total) by mouth daily. Qty: 30 capsule, Refills: 0    insulin glargine (LANTUS SOLOSTAR) 100 UNIT/ML injection Inject 15 Units into the skin at bedtime. Qty: 5 pen, Refills: 0    lisinopril (PRINIVIL,ZESTRIL) 40 MG tablet Take 1 tablet (40 mg total) by mouth daily. Qty: 30 tablet, Refills: 0      CONTINUE these medications which have CHANGED   Details  insulin aspart (NOVOLOG FLEXPEN) 100 UNIT/ML injection Inject 4 Units into the skin 3 (three) times daily with meals. Qty: 5 pen, Refills: 0    pravastatin (PRAVACHOL) 40 MG tablet Take 1 tablet (40 mg total) by mouth at bedtime. Qty: 30 tablet, Refills: 0      CONTINUE these medications which have NOT CHANGED   Details  carvedilol (COREG) 12.5 MG tablet Take 1 tablet (12.5 mg total) by mouth 2 (two) times daily with a meal. Qty: 60 tablet, Refills: 11    diphenhydrAMINE (BENADRYL) 25 MG tablet Take 25 mg by mouth every 6 (six) hours as needed.    fexofenadine (ALLEGRA) 180 MG tablet Take 180 mg by mouth daily.    nitroGLYCERIN (NITROSTAT) 0.4 MG SL tablet 1 tablet under tongue at onset of chest pain; you may repeat every 5 minutes for up to 3 doses.     B-D ULTRAFINE III SHORT PEN 31G X 8 MM MISC Use as directed three times daily      STOP taking these medications     aspirin 325 MG tablet Comments:  Reason for Stopping:       lisinopril-hydrochlorothiazide (PRINZIDE,ZESTORETIC) 20-12.5 MG per tablet Comments:  Reason for Stopping:          Hospital Course: TIA (transient ischemic attack) Present on Admission:  .TIA (transient ischemic attack)   1. TIA: Workup negative for stroke. Aim to control secondary risk factors such as diabetes, hypertension and hyperlipidemia. Neurology consulted. Changed aspirin to Plavix. Symptoms resolved. 2. Uncontrolled type 2 diabetes mellitus: Added Lantus 15 units each bedtime and changed NovoLog mealtime insulin 4 units 3 times  a day with meals. Improved control. Counseled regarding compliance with diet, medications and M.D. followups. 3. Uncontrolled hypertension: Patient was continued on carvedilol 12.5 mg twice a day. Increased his lisinopril to 40 mg by mouth daily and continue HCTZ 12.5 mg by mouth daily. 4. Dyslipidemia: Pravastatin dose was increased to 40 mg daily. 5. Abnormal EKG in patient with history of coronary artery  disease status post PCI is. Ruled out for acute MI. No further chest pain. Cardiology consulted. Statins were increased. Myoview was performed on 5/20. Cardiology indicates that it is a low risk Myoview and have cleared him for discharge home and we'll arrange for outpatient followup with their cardiologist.  Day of Discharge  Complaints: Denies any complaints.  Physical exam: BP 138/87  Pulse 78  Temp(Src) 97.4 F (36.3 C) (Oral)  Resp 18  Ht 5\' 11"  (1.803 m)  Wt 97.7 kg (215 lb 6.2 oz)  BMI 30.04 kg/m2  SpO2 100% General Exam: Comfortable.  Respiratory System: Clear. No increased work of breathing.  Cardiovascular System: First and second heart sounds heard. Regular rate and rhythm. No JVD/murmurs. Telemetry shows sinus rhythm in the 70s.  Gastrointestinal System: Abdomen is non distended, soft and normal bowel sounds heard. Nontender.  Central Nervous System: Alert and oriented. No focal neurological deficits.  Extremities: Grade 4+/5 left leg otherwise symmetric 5 x 5 power. No sensory deficits   Basic Metabolic Panel:  Lab [id number] 04/07/12 1702  NA 133* 138  K 3.5 4.3  CL 98 99  CO2 26 32  GLUCOSE 153* 286*  BUN 15 19  CREATININE 1.05 1.34  CALCIUM 9.2 9.6  ALB -- --  PHOS -- --   Liver Function Tests:  Lab 04/08/12 0310 04/07/12 1702  AST 26 30  ALT 44 52  ALKPHOS 71 86  BILITOT 0.9 0.8  PROT 7.5 8.3  ALBUMIN 3.6 4.1   No results found for this basename: LIPASE:3,AMYLASE:3 in the last 168 hours No results found for this basename: AMMONIA:3 in the last 168 hours CBC:  Lab 04/07/12 1702  WBC 7.1  NEUTROABS 2.9  HGB 15.1  HCT 43.8  MCV 89.4  PLT 202   Cardiac Enzymes:  Lab 04/08/12 1105 04/08/12 0310 04/07/12 1950 04/07/12 1702  CKTOTAL 158 164 167 145  CKMB 2.2 2.2 2.3 2.3  CKMBINDEX -- -- -- --  TROPONINI <0.30 <0.30 <0.30 <0.30   CBG:  Lab 04/10/12 1328 04/10/12 0732 04/09/12 2058 04/09/12 1704 04/09/12 1200  GLUCAP 225* 159* 133*  166* 165*   Other lab data:  1. Lipid panel: Significant for LDL of 106 otherwise within normal limits. 2. INR 0.99. 3. Hemoglobin A1c 9. 4. Urinalysis showed greater than 1000 mg/dL of glucose, negative for protein and no features of urinary tract infection.  Ct Head Wo Contrast  04/07/2012  *RADIOLOGY REPORT*  Clinical Data: Left-sided numbness and weakness.  Right sided facial twitching.  CT HEAD WITHOUT CONTRAST  Technique:  Contiguous axial images were obtained from the base of the skull through the vertex without contrast.  Comparison: CT of head 11/08/2008.  Findings: No definite acute intracranial abnormalities. Specifically, no definitive signs of acute/subacute ischemia, no focal mass, mass effect, intracranial hemorrhage, hydrocephalus or other abnormal intra or extra-axial fluid collections. There are patchy areas of decreased attenuation throughout the deep and periventricular white matter of the cerebral hemispheres bilaterally, compatible with chronic microvascular ischemic changes (similar to prior study).  The no displaced skull fractures are identified.  Visualized paranasal sinuses  and mastoids are generally well pneumatized, with exception of extensive mucoperiosteal thickening in the right mastoid sinus.  IMPRESSION: 1.  No acute intracranial abnormalities. 2.  Chronic microvascular ischemic changes in the cerebral white matter redemonstrated, as above. 3.  Changes compatible with chronic sinusitis in the right maxillary sinus again noted.  Original Report Authenticated By: Etheleen Mayhew, M.D.   Mr Jodene Nam Head Wo Contrast  04/07/2012  *RADIOLOGY REPORT*  Clinical Data:  TIA.  Left sided numbness and weakness.  Right facial twitching.  Diabetes and high blood pressure  MRI HEAD WITHOUT AND WITH CONTRAST MRA HEAD WITHOUT CONTRAST MRA NECK WITHOUT AND WITH CONTRAST  Technique:  Multiplanar, multiecho pulse sequences of the brain and surrounding structures were obtained without and  with intravenous contrast.  Angiographic images of the Circle of Willis were obtained using MRA technique without intravenous contrast. Angiographic images of the neck were obtained using MRA technique without and with intravenous contrast.  Carotid stenosis measurements (when applicable) are obtained utilizing NASCET criteria, using the distal internal carotid diameter as the denominator.  Contrast:  20 ml Multihance IV  Comparison:  CT head 04/07/2012  MRI HEAD  Findings:  Negative for acute infarct.  Moderate chronic microvascular ischemic changes in the cerebral white matter. Chronic ischemia in the pons bilaterally.  No cortical infarct. Chronic micro hemorrhage in the right occipital lobe and left medial basal ganglia.  No acute hemorrhage.  Postcontrast imaging reveals normal enhancement pattern.  No mass or leptomeningeal thickening.  Chronic sinusitis with mucosal thickening in the paranasal sinuses.  IMPRESSION: Chronic microvascular ischemic changes in the white matter and pons.  No acute infarct or mass.  Chronic sinusitis  MRA HEAD  Findings: Right vertebral artery is dominant and patent to the basilar without stenosis.  Left vertebral artery is small and ends in pica.  PICA is visualized bilaterally.  Superior cerebellar and posterior cerebral arteries are widely patent.  Internal carotid artery is patent bilaterally.  Hypoplastic right A1 segment.  Both anterior cerebral arteries are supplied from the left.  Anterior and middle cerebral arteries are patent bilaterally without stenosis.  Negative for aneurysm.  IMPRESSION:  Negative  MRA NECK  Findings: Carotid bifurcation is widely patent bilaterally.  No significant carotid stenosis.  Dominant right vertebral artery which is widely patent.  Left vertebral artery origin from the aorta.  Left vertebral artery is small and not well visualized but appears to be patent to PICA.  IMPRESSION: No significant carotid or vertebral stenosis.  Original Report  Authenticated By: Truett Perna, M.D.     Nm Myocar Multi W/spect W/wall Motion / Ef  04/10/2012  *RADIOLOGY REPORT*  Clinical Data:  Chest pain.  History of smoking, diabetes and hypertension.  MYOCARDIAL IMAGING WITH SPECT (REST AND PHARMACOLOGIC-STRESS) GATED LEFT VENTRICULAR WALL MOTION STUDY LEFT VENTRICULAR EJECTION FRACTION  Technique:  Resting myocardial SPECT imaging was initially performed after intravenous administration of radiopharmaceutical. Myocardial SPECT was subsequently performed after additional radiopharmaceutical injection during pharmacologic-stress (Lexiscan)supervised by the Cardiology staff.  Quantitative gated imaging was also performed to evaluate left ventricular wall motion, and estimate left ventricular ejection fraction.  Radiopharmaceutical:  10 mCi Tc-79m Myoview at rest and 30 mCi during stress.  Comparison: None relevant.  Findings:  SPECT images demonstrate normal left ventricular activity .  There are no suspicious fixed or reversible perfusion defects.  Gated cine images were reviewed on the workstation and demonstrate no focal wall motion abnormalities. The QGS ejection fraction measured at rest  is 47% with an end diastolic volume of 97 ml and an end-systolic volume of 52 ml.  IMPRESSION: Mild global hypokinesis.  Otherwise normal examination without evidence of pharmacologically induced myocardial ischemia.  The calculated left ventricular ejection fraction is 47%.  Original Report Authenticated By: Vivia Ewing, M.D.   Dg Chest Port 1 View  04/07/2012  *RADIOLOGY REPORT*  Clinical Data: Numbness, history hypertension, diabetes, smoking  PORTABLE CHEST - 1 VIEW  Comparison: Portable exam 1702 hours compared to 11/08/2008  Findings: Minimal prominence of cardiac silhouette unchanged. Tortuous aorta. Pulmonary vascularity normal for technique. Lungs grossly clear. No pleural effusion or pneumothorax. Bones unremarkable.  IMPRESSION: No acute abnormalities.   Original Report Authenticated By: Burnetta Sabin, M.D.   2-D echocardiogram:  Study Conclusions  - Left ventricle: The cavity size was normal. Wall thickness was increased in a pattern of mild LVH. There was mild concentric hypertrophy. Systolic function was normal. The estimated ejection fraction was in the range of 55% to 60%. Doppler parameters are consistent with abnormal left ventricular relaxation (grade 1 diastolic dysfunction). There was no evidence of elevated ventricular filling pressure by Doppler parameters. - Pulmonary veins: Not visualized. - Atrial septum: No defect or patent foramen ovale was identified. - Pulmonary arteries: PA peak pressure: 53mm Hg (S). - Impressions: No cardiac source of emboli was indentified. No definite segmental wall motion abnormalities . LA appendage was not visualized. Impressions:  - No cardiac source of emboli was indentified. No definite segmental wall motion abnormalities . LA appendage was not visualized.    Disposition: Discharged home in stable condition.  Diet: Diabetic and heart healthy diet.  Activity: Increase activity gradually.   Follow-up Appts: Discharge Orders    Future Orders Please Complete By Expires   Diet - low sodium heart healthy      Diet Carb Modified      Increase activity slowly      Call MD for:  severe uncontrolled pain         TESTS THAT NEED FOLLOW-UP None.  Time spent on discharge, talking to the patient, and coordinating care: 35 mins.   SignedVernell Leep, MD 04/10/2012, 3:20 PM

## 2012-04-10 NOTE — Progress Notes (Signed)
Patient is active with [facility] for RN Care Coordination services.For any additional questions or new referrals please contact Corliss Blacker BSN RN [facility] Liaison at [phone].

## 2012-04-10 NOTE — Progress Notes (Signed)
Nuclear stress test done. Changed to [place] given h/o back, knee problems per patient. Await interpretation of results. Lesli Issa PA-C

## 2012-04-10 NOTE — Progress Notes (Signed)
    SUBJECTIVE: No chest pain or SOB. No events.   BP 130/82  Pulse 71  Temp(Src) 97.7 F (36.5 C) (Oral)  Resp 19  Ht 5\' 11"  (1.803 m)  Wt 215 lb 6.2 oz (97.7 kg)  BMI 30.04 kg/m2  SpO2 98%  Intake/Output Summary (Last 24 hours) at 04/10/12 0717 Last data filed at 04/09/12 1818  Gross per 24 hour  Intake    480 ml  Output      0 ml  Net    480 ml    PHYSICAL EXAM General: Well developed, well nourished, in no acute distress. Alert and oriented x 3.  Psych:  Good affect, responds appropriately Neck: No JVD. No masses noted.  Lungs: Clear bilaterally with no wheezes or rhonci noted.  Heart: RRR with no murmurs noted. Abdomen: Bowel sounds are present. Soft, non-tender.  Extremities: No lower extremity edema.   LABS: Basic Metabolic Panel:  Basename 04/08/12 0310 04/07/12 1702  NA 133* 138  K 3.5 4.3  CL 98 99  CO2 26 32  GLUCOSE 153* 286*  BUN 15 19  CREATININE 1.05 1.34  CALCIUM 9.2 9.6  MG 2.0 --  PHOS -- --   CBC:  Basename 04/07/12 1702  WBC 7.1  NEUTROABS 2.9  HGB 15.1  HCT 43.8  MCV 89.4  PLT 202   Cardiac Enzymes:  Basename 04/08/12 1105 04/08/12 0310 04/07/12 1950  CKTOTAL 158 164 167  CKMB 2.2 2.2 2.3  CKMBINDEX -- -- --  TROPONINI <0.30 <0.30 <0.30   Fasting Lipid Panel:  Basename 04/08/12 0310  CHOL 194  HDL 64  LDLCALC 106*  TRIG 120  CHOLHDL 3.0  LDLDIRECT --    Current Meds:    . carvedilol  12.5 mg Oral BID WC  . clopidogrel  75 mg Oral Daily  . heparin  5,000 Units Subcutaneous Q8H  . hydrochlorothiazide  12.5 mg Oral Daily  . insulin aspart  0-15 Units Subcutaneous TID WC  . insulin aspart  0-5 Units Subcutaneous QHS  . insulin aspart  4 Units Subcutaneous TID WC  . insulin glargine  15 Units Subcutaneous QHS  . lisinopril  40 mg Oral Daily  . simvastatin  40 mg Oral q1800  . sodium chloride  3 mL Intravenous Q12H  . DISCONTD: carvedilol  6.25 mg Oral BID WC  . DISCONTD: lisinopril  20 mg Oral Daily      ASSESSMENT AND PLAN: 61 yo male with history of HTN and CAD admitted with symptoms c/w TIA. Cardiology consulted last week. Pt has had no chest pain here but had a few episodes of chest pain last week. Echo with normal LV function but has mild LVH. EKG with diffuse T wave inversions and ST changes c/w LVH.   1. CAD/chest pain: As per plans of Dr. Verl Blalock yesterday, stress myoview today to assess for ischemia. If the stress test is abnormal, will plan cardiac cath later this week. If stress myoview, negative then ok to d/c home and follow up with Dr. Liam Rogers (he had seen Dr. Haroldine Laws but he is no longer seeing general cardiology patients in clinic).     [name]  5/20/20137:17 AM

## 2012-04-11 ENCOUNTER — Other Ambulatory Visit (HOSPITAL_COMMUNITY): Payer: Self-pay | Admitting: Internal Medicine

## 2012-04-13 ENCOUNTER — Ambulatory Visit (INDEPENDENT_AMBULATORY_CARE_PROVIDER_SITE_OTHER): Payer: Medicare Other | Admitting: Internal Medicine

## 2012-04-13 ENCOUNTER — Encounter: Payer: Self-pay | Admitting: Internal Medicine

## 2012-04-13 VITALS — BP 130/78 | HR 82 | Temp 98.2°F | Ht 71.0 in | Wt 217.1 lb

## 2012-04-13 DIAGNOSIS — IMO0001 Reserved for inherently not codable concepts without codable children: Secondary | ICD-10-CM

## 2012-04-13 DIAGNOSIS — G459 Transient cerebral ischemic attack, unspecified: Secondary | ICD-10-CM

## 2012-04-13 DIAGNOSIS — K219 Gastro-esophageal reflux disease without esophagitis: Secondary | ICD-10-CM

## 2012-04-13 DIAGNOSIS — I10 Essential (primary) hypertension: Secondary | ICD-10-CM

## 2012-04-13 MED ORDER — ATENOLOL 25 MG PO TABS: 25.0000 mg | ORAL_TABLET | Freq: Every day | ORAL | Status: DC

## 2012-04-13 MED ORDER — PANTOPRAZOLE SODIUM 40 MG PO TBEC: 40.0000 mg | DELAYED_RELEASE_TABLET | Freq: Every day | ORAL | Status: DC

## 2012-04-13 NOTE — Assessment & Plan Note (Addendum)
Uncontrolled Changed to Lantus 03/2012 hosp for TIA - off metformin/januvia Continue Novolog TID AC Also encouraged follow up with endo - [facility] Lab Results  Component Value Date   HGBA1C 9.0* 04/07/2012

## 2012-04-13 NOTE — Progress Notes (Signed)
Subjective:    Patient ID: [name], male    DOB: 03-01-1951, 61 y.o.   MRN: [id number]  HPI Here for hosp follow up 03/2012: 1. TIA: Workup negative for stroke. Aim to control secondary risk factors such as diabetes, hypertension and hyperlipidemia. Neurology consulted. Changed aspirin to Plavix. Symptoms resolved.  2. Uncontrolled type 2 diabetes mellitus: Added Lantus 15 units each bedtime and changed NovoLog mealtime insulin 4 units 3 times a day with meals. Improved control. Counseled regarding compliance with diet, medications and M.D. followups.  3. Uncontrolled hypertension: Patient was continued on carvedilol 12.5 mg twice a day. Increased his lisinopril to 40 mg by mouth daily and continue HCTZ 12.5 mg by mouth daily.  4. Dyslipidemia: Pravastatin dose was increased to 40 mg daily.  5. Abnormal EKG in patient with history of coronary artery disease status post PCI is. Ruled out for acute MI. No further chest pain. Cardiology consulted. Statins were increased. Myoview was performed on 5/20. Cardiology indicates that it is a low risk Myoview and have cleared him for discharge home- outpatient followup with their cardiologist.  Reports carvediolol causes him to "feel bad" and refusing to take same  Past Medical History  Diagnosis Date  . ALLERGIC RHINITIS   . CAD, NATIVE VESSEL     BMS to OM1 2001, DES to BMS 2005  . DIAB W/UNSPEC COMP TYPE II/UNSPEC TYPE UNCNTRL   . ERECTILE DYSFUNCTION   . GERD   . HYPERLIPIDEMIA-MIXED   . HYPERTENSION, BENIGN   . MORTON'S NEUROMA, RIGHT   . SHOULDER PAIN, RIGHT     Review of Systems  Constitutional: Negative for fever and fatigue.  Respiratory: Negative for shortness of breath and wheezing.   Neurological: Negative for dizziness and headaches.       Objective:   Physical Exam BP 130/78  Pulse 82  Temp(Src) 98.2 F (36.8 C) (Oral)  Ht 5\' 11"  (1.803 m)  Wt 217 lb 1.9 oz (98.485 kg)  BMI 30.28 kg/m2  SpO2 97% Wt Readings  from Last 3 Encounters:  04/13/12 217 lb 1.9 oz (98.485 kg)  04/07/12 215 lb 6.2 oz (97.7 kg)  12/14/11 211 lb 6.4 oz (95.89 kg)   Constitutional:  He appears well-developed and well-nourished. No distress. Family at side Neck: Normal range of motion. Neck supple. No JVD present. No thyromegaly present.  Cardiovascular: Normal rate, regular rhythm and normal heart sounds.  No murmur heard. no BLE edema Pulmonary/Chest: Effort normal and breath sounds normal. No respiratory distress. no wheezes.  Neurological: he is alert and oriented to person, place, and time. No cranial nerve deficit. Coordination normal.  Skin: Skin is warm and dry.  No erythema or ulceration.  Psychiatric: he has a normal mood and affect. behavior is normal. Judgment and thought content normal.   Lab Results  Component Value Date   WBC 7.1 04/07/2012   HGB 15.1 04/07/2012   HCT 43.8 04/07/2012   PLT 202 04/07/2012   GLUCOSE 153* 04/08/2012   CHOL 194 04/08/2012   TRIG 120 04/08/2012   HDL 64 04/08/2012   LDLDIRECT 112.2 02/25/2010   LDLCALC 106* 04/08/2012   ALT 44 04/08/2012   AST 26 04/08/2012   NA 133* 04/08/2012   K 3.5 04/08/2012   CL 98 04/08/2012   CREATININE 1.05 04/08/2012   BUN 15 04/08/2012   CO2 26 04/08/2012   PSA 0.16 04/13/2010   INR 0.99 04/07/2012   HGBA1C 9.0* 04/07/2012       Assessment &  Plan:  See problem list. Medications and labs reviewed today.  Time spent with pt/family today 30 minutes, greater than 50% time spent counseling patient on diabetes,TIA and medication review. Also review of hospital records/test

## 2012-04-13 NOTE — Patient Instructions (Signed)
It was good to see you today. We have reviewed your prior records including labs and tests today Stop carvedilol and start atenolol at bedtime Start protonix for indigestion and reflux we'll make referral to endocrinology Dr Loanne Drilling. Our office will contact you regarding appointment(s) once made. Please schedule followup in 3-4 months, call sooner if problems.

## 2012-04-13 NOTE — Assessment & Plan Note (Signed)
Event 03/2012 reviewed, hospitalization with tests unremarkable for CVA - no residual symptoms control of risk factors addressed and importance of compliance reviewed Changed Plavix from ASA 03/2012 hosp - continue same

## 2012-04-13 NOTE — Assessment & Plan Note (Signed)
Suspect causes of SS chest symptoms given unremarkable nuc stress test 04/10/12 Start protonix

## 2012-04-13 NOTE — Assessment & Plan Note (Signed)
Reports coreg causes side effects - stop same Try alternate beta-blocker - low dose atenolol follow up cards as planned for additional titration BP Readings from Last 3 Encounters:  04/13/12 130/78  04/10/12 138/87  04/10/12 143/87

## 2012-04-20 ENCOUNTER — Ambulatory Visit (INDEPENDENT_AMBULATORY_CARE_PROVIDER_SITE_OTHER): Payer: Medicare Other | Admitting: Endocrinology

## 2012-04-20 ENCOUNTER — Encounter: Payer: Self-pay | Admitting: Endocrinology

## 2012-04-20 VITALS — BP 112/80 | HR 71 | Temp 97.7°F | Ht 71.0 in | Wt 215.0 lb

## 2012-04-20 DIAGNOSIS — IMO0001 Reserved for inherently not codable concepts without codable children: Secondary | ICD-10-CM

## 2012-04-20 NOTE — Patient Instructions (Addendum)
Increase the lantus to 20 units at bedtime.   Reduce novolog to 3x a day (just before each meal) 4-2-4.   Please come back for a follow-up appointment in 6 weeks.   check your blood sugar 2 times a day.  vary the time of day when you check, between before the 3 meals, and at bedtime.  also check if you have symptoms of your blood sugar being too high or too low.  please keep a record of the readings and bring it to your next appointment here.  please call us sooner if your blood sugar goes below 70, or if it stays over 200.

## 2012-04-20 NOTE — Progress Notes (Signed)
Subjective:    Patient ID: [name], male    DOB: 1951-07-06, 61 y.o.   MRN: [id number]  HPI Pt returns for f/u of insulin-requiring DM (dx'ed [id number]; complicated by CAD and TIA).  no cbg record, but states cbg's are 113-220.  It is still highest in am, and lowest in the afternoon.   Past Medical History  Diagnosis Date  . ALLERGIC RHINITIS   . CAD, NATIVE VESSEL     BMS to OM1 2001, DES to BMS 2005  . DIAB W/UNSPEC COMP TYPE II/UNSPEC TYPE UNCNTRL   . ERECTILE DYSFUNCTION   . GERD   . HYPERLIPIDEMIA-MIXED   . HYPERTENSION, BENIGN   . MORTON'S NEUROMA, RIGHT   . SHOULDER PAIN, RIGHT     Past Surgical History  Procedure Date  . Stent 2001, 2004    coronary stents  . Angioplasty   . Left knee surgury   . Dental surgery     History   Social History  . Marital Status: Legally Separated    Spouse Name: N/A    Number of Children: N/A  . Years of Education: N/A   Occupational History  . Not on file.   Social History Main Topics  . Smoking status: Former Smoker    Quit date: 02/24/1983  . Smokeless tobacco: Never Used   Comment: Married, livesin [place] with wife. He is disable secondary to back pain. 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 Alcohol Use: No  . Drug Use: No  . Sexually Active: Not on file   Other Topics Concern  . Not on file   Social History Narrative  . No narrative on file    Current Outpatient Prescriptions on File Prior to Visit  Medication Sig Dispense Refill  . atenolol (TENORMIN) 25 MG tablet Take 1 tablet (25 mg total) by mouth daily.  30 tablet  5  . B-D ULTRAFINE III SHORT PEN 31G X 8 MM MISC Use as directed three times daily      . clopidogrel (PLAVIX) 75 MG tablet Take 1 tablet (75 mg total) by mouth daily.  30 tablet  0  . diphenhydrAMINE (BENADRYL) 25 MG tablet Take 25 mg by mouth every 6 (six) hours as needed.      . fexofenadine (ALLEGRA) 180 MG tablet Take 180 mg by mouth daily.      . hydrochlorothiazide (MICROZIDE)  12.5 MG capsule Take 1 capsule (12.5 mg total) by mouth daily.  30 capsule  0  . insulin aspart (NOVOLOG) 100 UNIT/ML injection 3x a day (just before each meal) 4-2-4 units      . insulin glargine (LANTUS) 100 UNIT/ML injection Inject 20 Units into the skin at bedtime.      Marland Kitchen lisinopril (PRINIVIL,ZESTRIL) 40 MG tablet Take 1 tablet (40 mg total) by mouth daily.  30 tablet  0  . NITROSTAT 0.4 MG SL tablet DISSOLVE ONE TABLET UNDER THE TONGUE EVERY 5 MINUTES AS NEEDED FOR CHEST PAIN.  DO NOT EXCEED A TOTAL OF 3 DOSES IN 15 MINUTES  25 each  3  . pantoprazole (PROTONIX) 40 MG tablet Take 1 tablet (40 mg total) by mouth at bedtime.  30 tablet  3  . pravastatin (PRAVACHOL) 40 MG tablet Take 1 tablet (40 mg total) by mouth at bedtime.  30 tablet  0    Allergies  Allergen Reactions  . Penicillins Anaphylaxis  . Pneumococcal Vaccines     Pt states allergic to it.  Marland Kitchen Shellfish Allergy  Anaphylaxis    Family History  Problem Relation Age of Onset  . Stroke      BP 112/80  Pulse 71  Temp(Src) 97.7 F (36.5 C) (Oral)  Ht 5\' 11"  (1.803 m)  Wt 215 lb (97.523 kg)  BMI 29.99 kg/m2  SpO2 96%  Review of Systems Denies loc    Objective:   Physical Exam VITAL SIGNS:  See vs page GENERAL: no distress SKIN:  Insulin injection sites at the anterior abdomen are normal   Lab Results  Component Value Date   HGBA1C 9.0* 04/07/2012      Assessment & Plan:  DM.  needs increased rx

## 2012-05-11 ENCOUNTER — Other Ambulatory Visit: Payer: Self-pay

## 2012-05-11 MED ORDER — HYDROCHLOROTHIAZIDE 12.5 MG PO CAPS: 12.5000 mg | ORAL_CAPSULE | Freq: Every day | ORAL | Status: DC

## 2012-05-11 MED ORDER — CLOPIDOGREL BISULFATE 75 MG PO TABS: 75.0000 mg | ORAL_TABLET | Freq: Every day | ORAL | Status: DC

## 2012-05-11 MED ORDER — PRAVASTATIN SODIUM 40 MG PO TABS: 40.0000 mg | ORAL_TABLET | Freq: Every day | ORAL | Status: DC

## 2012-05-15 ENCOUNTER — Other Ambulatory Visit: Payer: Self-pay | Admitting: *Deleted

## 2012-05-15 MED ORDER — INSULIN GLARGINE 100 UNIT/ML ~~LOC~~ SOLN: 20.0000 [IU] | Freq: Every day | SUBCUTANEOUS | Status: DC

## 2012-05-17 ENCOUNTER — Encounter: Payer: Self-pay | Admitting: Cardiovascular Disease

## 2012-05-17 ENCOUNTER — Ambulatory Visit (INDEPENDENT_AMBULATORY_CARE_PROVIDER_SITE_OTHER): Payer: Medicare Other | Admitting: Cardiovascular Disease

## 2012-05-17 VITALS — BP 146/83 | HR 67 | Ht 71.0 in | Wt 215.1 lb

## 2012-05-17 DIAGNOSIS — Z79899 Other long term (current) drug therapy: Secondary | ICD-10-CM

## 2012-05-17 DIAGNOSIS — E785 Hyperlipidemia, unspecified: Secondary | ICD-10-CM

## 2012-05-17 MED ORDER — ATORVASTATIN CALCIUM 40 MG PO TABS: 40.0000 mg | ORAL_TABLET | Freq: Every day | ORAL | Status: DC

## 2012-05-17 NOTE — Progress Notes (Signed)
[name] Date of Birth  [birth year]       [facility] Office [address], [address], [place], [place]  [address]   [place],   [phone]     [phone]   Fax  [phone]    Fax [phone]  Problem List: 1. Diabetes mellitus 2. History of coronary artery disease-status post PTCA and stenting in the past.  ejection fraction 47%. 3. Hyperlipidemia 4. Hypertension  History of Present Illness: Patient is a 61 y.o. male with a PMHx of diabetes mellitus, coronary artery disease, and hyperlipidemia, who was admitted to [facility] on 04/07/2012 for evaluation of left-sided numbness and tingling.  He had a stress myoview which was normal.  He is still eating sweets and his glucose levels are not well controlled.  He's had occasional episodes of chest burning that he thinks is due to indigestion. His diabetes is still not well controlled. His fasting glucose levels are still mildly elevated.  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 He walks all over campus without any episodes of chest pain or shortness breath.    Current Outpatient Prescriptions on File Prior to Visit  Medication Sig Dispense Refill  . atenolol (TENORMIN) 25 MG tablet Take 1 tablet (25 mg total) by mouth daily.  30 tablet  5  . B-D ULTRAFINE III SHORT PEN 31G X 8 MM MISC Use as directed three times daily      . clopidogrel (PLAVIX) 75 MG tablet Take 1 tablet (75 mg total) by mouth daily.  30 tablet  2  . diphenhydrAMINE (BENADRYL) 25 MG tablet Take 25 mg by mouth every 6 (six) hours as needed.      . fexofenadine (ALLEGRA) 180 MG tablet Take 180 mg by mouth as needed.       . hydrochlorothiazide (MICROZIDE) 12.5 MG capsule Take 1 capsule (12.5 mg total) by mouth daily.  30 capsule  2  . insulin aspart (NOVOLOG) 100 UNIT/ML injection 3x a day (just before each meal) 4-2-4 units      . insulin glargine (LANTUS) 100 UNIT/ML injection  Inject 20 Units into the skin at bedtime.  10 mL  5  . lisinopril (PRINIVIL,ZESTRIL) 40 MG tablet Take 1 tablet (40 mg total) by mouth daily.  30 tablet  0  . NITROSTAT 0.4 MG SL tablet DISSOLVE ONE TABLET UNDER THE TONGUE EVERY 5 MINUTES AS NEEDED FOR CHEST PAIN.  DO NOT EXCEED A TOTAL OF 3 DOSES IN 15 MINUTES  25 each  3  . pantoprazole (PROTONIX) 40 MG tablet Take 1 tablet (40 mg total) by mouth at bedtime.  30 tablet  3  . pravastatin (PRAVACHOL) 40 MG tablet Take 1 tablet (40 mg total) by mouth at bedtime.  30 tablet  2    Allergies  Allergen Reactions  . Penicillins Anaphylaxis  . Pneumococcal Vaccines     Pt states allergic to it.  Marland Kitchen Shellfish Allergy Anaphylaxis    Past Medical History  Diagnosis Date  . ALLERGIC RHINITIS   . CAD, NATIVE VESSEL     BMS to OM1 2001, DES to BMS 2005  . DIAB W/UNSPEC COMP TYPE II/UNSPEC TYPE UNCNTRL   . ERECTILE DYSFUNCTION   . GERD   . HYPERLIPIDEMIA-MIXED   . HYPERTENSION, BENIGN   . MORTON'S NEUROMA, RIGHT   . SHOULDER PAIN, RIGHT     Past Surgical History  Procedure Date  .  Stent 2001, 2004    coronary stents  . Angioplasty   . Left knee surgury   . Dental surgery     History  Smoking status  . Former Smoker  . Quit date: 02/24/1983  Smokeless tobacco  . Never Used  Comment: Married, livesin [place] with wife. He is disable secondary to back pain. 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 Stroke      Reviw of Systems:  Reviewed in the HPI.  All other systems are negative.  Physical Exam: Blood pressure 146/83, pulse 67, height 5\' 11"  (1.803 m), weight 215 lb 1.9 oz (97.578 kg). General: Well developed, well nourished, in no acute distress.  Head: Normocephalic, atraumatic, sclera non-icteric, mucus membranes are moist,   Neck: Supple. Carotids are 2 + without bruits. No JVD  Lungs: Clear bilaterally to auscultation.  Heart: regular  rate.  normal  S1 S2. No murmurs, gallops or rubs.  Abdomen: Soft, non-tender, non-distended with normal bowel sounds. No hepatomegaly. No rebound/guarding. No masses.  Msk:  Strength and tone are normal  Extremities: No clubbing or cyanosis. No edema.  Distal pedal pulses are 2+ and equal bilaterally.  Neuro: Alert and oriented X 3. Moves all extremities spontaneously.  Psych:  Responds to questions appropriately with a normal affect.  ECG:  Assessment / Plan:

## 2012-05-17 NOTE — Assessment & Plan Note (Signed)
His LDL is 106. He has a history of diabetes and history of coronary artery disease. Her goal LDL is 70. We'll discontinue the Pravachol and start him on atorvastatin 40 mg a day. We'll check fasting labs in 3 months. I'll see him again in 6 months for followup visit.  The [place] study nurses have identified him as an may be a candidate to participate in her study. Will discuss this at his next visit.

## 2012-05-17 NOTE — Assessment & Plan Note (Signed)
[name] is doing fairly well. He's not had any episodes of chest pain or shortness breath. He has had a few episodes of indigestion.  We'll continue with the same medications except for changing his Pravachol to atorvastatin as noted. He's on a beta blocker and ACE inhibitor.

## 2012-05-17 NOTE — Patient Instructions (Addendum)
Your physician wants you to follow-up in: [place] will receive a reminder letter in the mail two months in advance. If you don't receive a letter, please call our office to schedule the follow-up appointment. Your physician has recommended you make the following change in your medication: STOP PRAVASTATIN  START  ATORVASTATIN 40 MG EVERY EVENING Your physician recommends that you return for lab work in: FASTING  LAB IN  3 MONTHS   DUE END OF SEPTEMBER BMET LIPID LIVER  DX 272.4 V58.69

## 2012-06-01 ENCOUNTER — Ambulatory Visit: Payer: Medicare Other | Admitting: Endocrinology

## 2012-06-12 ENCOUNTER — Other Ambulatory Visit: Payer: Self-pay

## 2012-06-12 MED ORDER — INSULIN GLARGINE 100 UNIT/ML ~~LOC~~ SOLN: 20.0000 [IU] | Freq: Every day | SUBCUTANEOUS | Status: DC

## 2012-06-12 NOTE — Telephone Encounter (Signed)
Pharmacy called requesting new Rx for lantus solostar per pt preference.

## 2012-06-30 ENCOUNTER — Encounter: Payer: Self-pay | Admitting: Endocrinology

## 2012-06-30 ENCOUNTER — Ambulatory Visit (INDEPENDENT_AMBULATORY_CARE_PROVIDER_SITE_OTHER): Payer: Medicare Other | Admitting: Endocrinology

## 2012-06-30 VITALS — BP 128/98 | HR 66 | Temp 98.3°F | Ht 71.0 in | Wt 209.0 lb

## 2012-06-30 DIAGNOSIS — J069 Acute upper respiratory infection, unspecified: Secondary | ICD-10-CM

## 2012-06-30 MED ORDER — PROMETHAZINE-CODEINE 6.25-10 MG/5ML PO SYRP: 5.0000 mL | ORAL_SOLUTION | ORAL | Status: DC | PRN

## 2012-06-30 MED ORDER — DOXYCYCLINE HYCLATE 100 MG PO TABS: 100.0000 mg | ORAL_TABLET | Freq: Two times a day (BID) | ORAL | Status: AC

## 2012-06-30 MED ORDER — LISINOPRIL 40 MG PO TABS: 40.0000 mg | ORAL_TABLET | Freq: Every day | ORAL | Status: DC

## 2012-06-30 MED ORDER — TRIAMCINOLONE ACETONIDE 0.1 % EX CREA: TOPICAL_CREAM | Freq: Three times a day (TID) | CUTANEOUS | Status: DC

## 2012-06-30 NOTE — Progress Notes (Signed)
Subjective:    Patient ID: [name], male    DOB: 1951/04/02, 61 y.o.   MRN: [id number]  HPI Pt states a few days of slight dysphagia sxs, in the context of swallowing either solids or liquids.  He assoc odynophagia.  He also has a prod cough.   He hasn't been taking the zestril.   Past Medical History  Diagnosis Date  . ALLERGIC RHINITIS   . CAD, NATIVE VESSEL     BMS to OM1 2001, DES to BMS 2005  . DIAB W/UNSPEC COMP TYPE II/UNSPEC TYPE UNCNTRL   . ERECTILE DYSFUNCTION   . GERD   . HYPERLIPIDEMIA-MIXED   . HYPERTENSION, BENIGN   . MORTON'S NEUROMA, RIGHT   . SHOULDER PAIN, RIGHT     Past Surgical History  Procedure Date  . Stent 2001, 2004    coronary stents  . Angioplasty   . Left knee surgury   . Dental surgery     History   Social History  . Marital Status: Legally Separated    Spouse Name: N/A    Number of Children: N/A  . Years of Education: N/A   Occupational History  . Not on file.   Social History Main Topics  . Smoking status: Former Smoker    Quit date: 02/24/1983  . Smokeless tobacco: Never Used   Comment: Married, livesin [place] wife. He is disable secondary to back pain. 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 Alcohol Use: No  . Drug Use: No  . Sexually Active: Not on file   Other Topics Concern  . Not on file   Social History Narrative  . No narrative on file    Current Outpatient Prescriptions on File Prior to Visit  Medication Sig Dispense Refill  . atenolol (TENORMIN) 25 MG tablet Take 1 tablet (25 mg total) by mouth daily.  30 tablet  5  . atorvastatin (LIPITOR) 40 MG tablet Take 1 tablet (40 mg total) by mouth daily.  90 tablet  3  . B-D ULTRAFINE III SHORT PEN 31G X 8 MM MISC Use as directed three times daily      . clopidogrel (PLAVIX) 75 MG tablet Take 1 tablet (75 mg total) by mouth daily.  30 tablet  2  . diphenhydrAMINE (BENADRYL) 25 MG tablet Take 25 mg by mouth every 6 (six) hours as needed.      .  fexofenadine (ALLEGRA) 180 MG tablet Take 180 mg by mouth as needed.       . hydrochlorothiazide (MICROZIDE) 12.5 MG capsule Take 1 capsule (12.5 mg total) by mouth daily.  30 capsule  2  . insulin aspart (NOVOLOG) 100 UNIT/ML injection 3x a day (just before each meal) 4-2-4 units      . insulin glargine (LANTUS) 100 UNIT/ML injection Inject 20 Units into the skin at bedtime.  6 mL  5  . lisinopril (PRINIVIL,ZESTRIL) 40 MG tablet Take 1 tablet (40 mg total) by mouth daily.  30 tablet  4  . NITROSTAT 0.4 MG SL tablet DISSOLVE ONE TABLET UNDER THE TONGUE EVERY 5 MINUTES AS NEEDED FOR CHEST PAIN.  DO NOT EXCEED A TOTAL OF 3 DOSES IN 15 MINUTES  25 each  3  . pantoprazole (PROTONIX) 40 MG tablet Take 1 tablet (40 mg total) by mouth at bedtime.  30 tablet  3    Allergies  Allergen Reactions  . Penicillins Anaphylaxis  . Pneumococcal Vaccines     Pt states allergic to it.  Marland Kitchen  Shellfish Allergy Anaphylaxis    Family History  Problem Relation Age of Onset  . Stroke      BP 128/98  Pulse 66  Temp 98.3 F (36.8 C) (Oral)  Ht 5\' 11"  (1.803 m)  Wt 209 lb (94.802 kg)  BMI 29.15 kg/m2  SpO2 98%  Review of Systems He has night sweats, but is uncertain if he has fever.  Denies heartburn.       Objective:   Physical Exam VITAL SIGNS:  See vs page GENERAL: no distress head: no deformity eyes: no periorbital swelling, no proptosis external nose and ears are normal mouth: no lesion seen Both tm's are slightly red. NECK: There is no palpable thyroid enlargement.  No thyroid nodule is palpable.  No palpable lymphadenopathy at the anterior neck.   LUNGS:  Clear to auscultation.   Skin: several insect bites on the forearms.       Assessment & Plan:  [name], new Dysphagia and odynophagia, prob due to uri HTN.  zestril could cause angioedema, but he is not on it now.  He can stay-off it for now. Insect bites, prob unrelated to other sxs.

## 2012-06-30 NOTE — Patient Instructions (Addendum)
Hold-off on the lisinopril for now Here are 3 prescriptions: cough syrup, antibiotic, and anti-itch cream I hope you feel better soon.  If you don't feel better by next week, please call back.  Please call sooner if you get worse. Please see d leschber as scheduled.

## 2012-07-05 ENCOUNTER — Emergency Department (HOSPITAL_COMMUNITY): Payer: Medicare Other

## 2012-07-05 ENCOUNTER — Encounter (HOSPITAL_COMMUNITY): Payer: Self-pay | Admitting: Emergency Medicine

## 2012-07-05 ENCOUNTER — Emergency Department (HOSPITAL_COMMUNITY)
Admission: EM | Admit: 2012-07-05 | Discharge: 2012-07-05 | Disposition: A | Discharge status: Home / Self Care | Payer: Medicare Other | Attending: Emergency Medicine | Admitting: Emergency Medicine

## 2012-07-05 DIAGNOSIS — Z823 Family history of stroke: Secondary | ICD-10-CM | POA: Insufficient documentation

## 2012-07-05 DIAGNOSIS — Z91013 Allergy to seafood: Secondary | ICD-10-CM | POA: Insufficient documentation

## 2012-07-05 DIAGNOSIS — Z888 Allergy status to other drugs, medicaments and biological substances status: Secondary | ICD-10-CM | POA: Insufficient documentation

## 2012-07-05 DIAGNOSIS — E785 Hyperlipidemia, unspecified: Secondary | ICD-10-CM | POA: Insufficient documentation

## 2012-07-05 DIAGNOSIS — Z794 Long term (current) use of insulin: Secondary | ICD-10-CM | POA: Insufficient documentation

## 2012-07-05 DIAGNOSIS — E119 Type 2 diabetes mellitus without complications: Secondary | ICD-10-CM | POA: Insufficient documentation

## 2012-07-05 DIAGNOSIS — Z87891 Personal history of nicotine dependence: Secondary | ICD-10-CM | POA: Insufficient documentation

## 2012-07-05 DIAGNOSIS — N529 Male erectile dysfunction, unspecified: Secondary | ICD-10-CM | POA: Insufficient documentation

## 2012-07-05 DIAGNOSIS — I10 Essential (primary) hypertension: Secondary | ICD-10-CM | POA: Insufficient documentation

## 2012-07-05 DIAGNOSIS — Z880 Allergy status to penicillin: Secondary | ICD-10-CM | POA: Insufficient documentation

## 2012-07-05 DIAGNOSIS — K219 Gastro-esophageal reflux disease without esophagitis: Secondary | ICD-10-CM | POA: Insufficient documentation

## 2012-07-05 DIAGNOSIS — R0982 Postnasal drip: Secondary | ICD-10-CM

## 2012-07-05 DIAGNOSIS — I2510 Atherosclerotic heart disease of native coronary artery without angina pectoris: Secondary | ICD-10-CM | POA: Insufficient documentation

## 2012-07-05 DIAGNOSIS — J329 Chronic sinusitis, unspecified: Secondary | ICD-10-CM

## 2012-07-05 LAB — RAPID STREP SCREEN (MED CTR MEBANE ONLY): Streptococcus, Group A Screen (Direct): NEGATIVE

## 2012-07-05 MED ORDER — LEVOFLOXACIN 500 MG PO TABS: 500.0000 mg | ORAL_TABLET | Freq: Every day | ORAL | Status: DC

## 2012-07-05 MED ORDER — FLUTICASONE PROPIONATE 50 MCG/ACT NA SUSP: 2.0000 | Freq: Every day | NASAL | Status: DC

## 2012-07-05 NOTE — ED Provider Notes (Signed)
Medical screening examination/treatment/procedure(s) were performed by non-physician practitioner and as supervising physician I was immediately available for consultation/collaboration.  Leota Jacobsen, MD 07/05/12 [phone]

## 2012-07-05 NOTE — ED Notes (Signed)
Pt presenting to ed with c/o sore throat pain and cough pt states positive fever and chills. Pt states he saw his pcp for the same and has been on antibiotics but he's not feeling any better.

## 2012-07-05 NOTE — ED Provider Notes (Signed)
History     CSN: [id number]  Arrival date & time 07/05/12  1653   None     Chief Complaint  Patient presents with  . Sore Throat    (Consider location/radiation/quality/duration/timing/severity/associated sxs/prior treatment) HPI  61 y.o. insulin-dependent diabetic male complaining of sore throat productive cough fever and chills worsening over the course of 7 days. Patient saw his PCP and was given doxycycline without improvement. He describes the cough is positional denies any shortness of breath chest pain palpitations nausea vomiting. He also reports a hoarse voice.    Past Medical History  Diagnosis Date  . ALLERGIC RHINITIS   . CAD, NATIVE VESSEL     BMS to OM1 2001, DES to BMS 2005  . DIAB W/UNSPEC COMP TYPE II/UNSPEC TYPE UNCNTRL   . ERECTILE DYSFUNCTION   . GERD   . HYPERLIPIDEMIA-MIXED   . HYPERTENSION, BENIGN   . MORTON'S NEUROMA, RIGHT   . SHOULDER PAIN, RIGHT     Past Surgical History  Procedure Date  . Stent 2001, 2004    coronary stents  . Angioplasty   . Left knee surgury   . Dental surgery     Family History  Problem Relation Age of Onset  . Stroke      History  Substance Use Topics  . Smoking status: Former Smoker    Quit date: 02/24/1983  . Smokeless tobacco: Never Used   Comment: Married, livesin [place] with wife. He is disable secondary to back pain. 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 Alcohol Use: No      Review of Systems  Constitutional: Positive for fever.  HENT: Positive for congestion and sore throat.   Respiratory: Positive for cough. Negative for shortness of breath.   Cardiovascular: Negative for chest pain.  Gastrointestinal: Negative for nausea and vomiting.  All other systems reviewed and are negative.    Allergies  Penicillins; Pneumococcal vaccines; and Shellfish allergy  Home Medications   Current Outpatient Rx  Name Route Sig Dispense Refill  . ATENOLOL 25 MG PO TABS Oral Take 1 tablet (25 mg total)  by mouth daily. 30 tablet 5  . ATORVASTATIN CALCIUM 40 MG PO TABS Oral Take 1 tablet (40 mg total) by mouth daily. 90 tablet 3  . BD PEN NEEDLE SHORT U/F 31G X 8 MM MISC  Use as directed three times daily    . CLOPIDOGREL BISULFATE 75 MG PO TABS Oral Take 1 tablet (75 mg total) by mouth daily. 30 tablet 2  . DIPHENHYDRAMINE HCL 25 MG PO TABS Oral Take 25 mg by mouth every 6 (six) hours as needed.    Marland Kitchen DOXYCYCLINE HYCLATE 100 MG PO TABS Oral Take 1 tablet (100 mg total) by mouth 2 (two) times daily. 14 tablet 0  . FEXOFENADINE HCL 180 MG PO TABS Oral Take 180 mg by mouth as needed.     . GUAIFENESIN ER 600 MG PO TB12 Oral Take 600 mg by mouth 2 (two) times daily.    Marland Kitchen HYDROCHLOROTHIAZIDE 12.5 MG PO CAPS Oral Take 1 capsule (12.5 mg total) by mouth daily. 30 capsule 2  . INSULIN ASPART 100 UNIT/ML [place] SOLN  3x a day (just before each meal) 4-2-4 units    . INSULIN GLARGINE 100 UNIT/ML [place] SOLN Subcutaneous Inject 20 Units into the skin at bedtime. 6 mL 5  . LISINOPRIL 40 MG PO TABS Oral Take 1 tablet (40 mg total) by mouth daily. 30 tablet 4  . NITROSTAT 0.4  MG SL SUBL  DISSOLVE ONE TABLET UNDER THE TONGUE EVERY 5 MINUTES AS NEEDED FOR CHEST PAIN.  DO NOT EXCEED A TOTAL OF 3 DOSES IN 15 MINUTES 25 each 3    Dispense as written.    Needs to make follow up appointment  . PANTOPRAZOLE SODIUM 40 MG PO TBEC Oral Take 1 tablet (40 mg total) by mouth at bedtime. 30 tablet 3  . PROMETHAZINE-CODEINE 6.25-10 MG/5ML PO SYRP Oral Take 5 mLs by mouth every 4 (four) hours as needed for cough. 240 mL 0  . TRIAMCINOLONE ACETONIDE 0.1 % EX CREA Topical Apply topically 3 (three) times daily. As needed for itching 30 g 0    BP 150/80  Pulse 84  Temp 99.1 F (37.3 C) (Oral)  Resp 20  SpO2 97%  Physical Exam  Vitals reviewed. Constitutional: He is oriented to person, place, and time. He appears well-developed and well-nourished. No distress.  HENT:  Head: Normocephalic and atraumatic.  Right Ear: External ear  normal.  Left Ear: External ear normal.  Mouth/Throat: Oropharynx is clear and moist. No oropharyngeal exudate.       Patient has cobblestoning to posterior pharynx. Tonsils are mildly swollen and erythematous at 2+ bilaterally there is the same level.  Eyes: Conjunctivae and EOM are normal.  Neck: Normal range of motion. Neck supple.  Cardiovascular: Normal rate and regular rhythm.  Exam reveals no gallop and no friction rub.   No murmur heard. Pulmonary/Chest: Effort normal and breath sounds normal. No stridor. No respiratory distress. He has no wheezes. He has no rales. He exhibits no tenderness.  Abdominal: Soft. Bowel sounds are normal.  Musculoskeletal: Normal range of motion.  Lymphadenopathy:    He has cervical adenopathy.  Neurological: He is alert and oriented to person, place, and time.  Skin: Skin is warm and dry.  Psychiatric: He has a normal mood and affect.    ED Course  Procedures (including critical care time)   Labs Reviewed  RAPID STREP SCREEN   Dg Chest 2 View  07/05/2012  *RADIOLOGY REPORT*  Clinical Data: Fever, sore throat.  CHEST - 2 VIEW  Comparison: 04/07/2012  Findings: Coronary stent. Lungs clear.  Heart size and pulmonary vascularity normal.  No effusion.  Visualized bones unremarkable.  IMPRESSION: No acute disease  Original Report Authenticated By: Trecia Rogers, M.D.     1. Sinusitis   2. Postnasal drip       MDM  I think this patient likely has sinusitis with postnasal drip he has failed treatment with doxycycline I will start him on Levaquin and fluticasone nasal spray. He is instructed to return to the emergency room immediately for any shortness of breath nausea or vomiting.        Monico Blitz, PA-C 07/05/12 1921

## 2012-07-06 ENCOUNTER — Encounter (INDEPENDENT_AMBULATORY_CARE_PROVIDER_SITE_OTHER): Payer: Medicare Other | Admitting: Ophthalmology

## 2012-07-06 DIAGNOSIS — I10 Essential (primary) hypertension: Secondary | ICD-10-CM

## 2012-07-06 DIAGNOSIS — E11319 Type 2 diabetes mellitus with unspecified diabetic retinopathy without macular edema: Secondary | ICD-10-CM

## 2012-07-06 DIAGNOSIS — H35039 Hypertensive retinopathy, unspecified eye: Secondary | ICD-10-CM

## 2012-07-06 DIAGNOSIS — H43819 Vitreous degeneration, unspecified eye: Secondary | ICD-10-CM

## 2012-07-06 DIAGNOSIS — H35369 Drusen (degenerative) of macula, unspecified eye: Secondary | ICD-10-CM

## 2012-07-06 DIAGNOSIS — E1139 Type 2 diabetes mellitus with other diabetic ophthalmic complication: Secondary | ICD-10-CM

## 2012-07-07 ENCOUNTER — Ambulatory Visit: Payer: Medicare Other | Admitting: Internal Medicine

## 2012-07-13 ENCOUNTER — Encounter: Payer: Self-pay | Admitting: Endocrinology

## 2012-07-13 ENCOUNTER — Ambulatory Visit (INDEPENDENT_AMBULATORY_CARE_PROVIDER_SITE_OTHER): Payer: Medicare Other | Admitting: Endocrinology

## 2012-07-13 VITALS — BP 148/98 | HR 100 | Temp 97.3°F | Ht 70.0 in | Wt 208.0 lb

## 2012-07-13 DIAGNOSIS — Z23 Encounter for immunization: Secondary | ICD-10-CM

## 2012-07-13 DIAGNOSIS — R1310 Dysphagia, unspecified: Secondary | ICD-10-CM

## 2012-07-13 MED ORDER — HYDROCOD POLST-CHLORPHEN POLST 10-8 MG/5ML PO LQCR: 5.0000 mL | Freq: Two times a day (BID) | ORAL | Status: DC | PRN

## 2012-07-13 NOTE — Progress Notes (Signed)
Subjective:    Patient ID: [name], male    DOB: Apr 07, 1951, 61 y.o.   MRN: [id number]  HPI Pt states 1 month of moderate pain at the throat, and assoc prod cough.  abx have not helped. Past Medical History  Diagnosis Date  . ALLERGIC RHINITIS   . CAD, NATIVE VESSEL     BMS to OM1 2001, DES to BMS 2005  . DIAB W/UNSPEC COMP TYPE II/UNSPEC TYPE UNCNTRL   . ERECTILE DYSFUNCTION   . GERD   . HYPERLIPIDEMIA-MIXED   . HYPERTENSION, BENIGN   . MORTON'S NEUROMA, RIGHT   . SHOULDER PAIN, RIGHT     Past Surgical History  Procedure Date  . Stent 2001, 2004    coronary stents  . Angioplasty   . Left knee surgury   . Dental surgery     History   Social History  . Marital Status: Legally Separated    Spouse Name: N/A    Number of Children: N/A  . Years of Education: N/A   Occupational History  . Not on file.   Social History Main Topics  . Smoking status: Former Smoker    Quit date: 02/24/1983  . Smokeless tobacco: Never Used   Comment: Married, livesin [place] wife. He is disable secondary to back pain. 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 Alcohol Use: No  . Drug Use: No  . Sexually Active: Not on file   Other Topics Concern  . Not on file   Social History Narrative  . No narrative on file    Current Outpatient Prescriptions on File Prior to Visit  Medication Sig Dispense Refill  . atenolol (TENORMIN) 25 MG tablet Take 1 tablet (25 mg total) by mouth daily.  30 tablet  5  . atorvastatin (LIPITOR) 40 MG tablet Take 1 tablet (40 mg total) by mouth daily.  90 tablet  3  . B-D ULTRAFINE III SHORT PEN 31G X 8 MM MISC Use as directed three times daily      . clopidogrel (PLAVIX) 75 MG tablet Take 1 tablet (75 mg total) by mouth daily.  30 tablet  2  . diphenhydrAMINE (BENADRYL) 25 MG tablet Take 25 mg by mouth every 6 (six) hours as needed.      . fexofenadine (ALLEGRA) 180 MG tablet Take 180 mg by mouth as needed.       . fluticasone (FLONASE) 50  MCG/ACT nasal spray Place 2 sprays into the nose daily.  16 g  2  . guaiFENesin (MUCINEX) 600 MG 12 hr tablet Take 600 mg by mouth 2 (two) times daily.      . hydrochlorothiazide (MICROZIDE) 12.5 MG capsule Take 1 capsule (12.5 mg total) by mouth daily.  30 capsule  2  . insulin aspart (NOVOLOG) 100 UNIT/ML injection 3x a day (just before each meal) 4-2-4 units      . insulin glargine (LANTUS) 100 UNIT/ML injection Inject 20 Units into the skin at bedtime.  6 mL  5  . lisinopril (PRINIVIL,ZESTRIL) 40 MG tablet Take 1 tablet (40 mg total) by mouth daily.  30 tablet  4  . NITROSTAT 0.4 MG SL tablet DISSOLVE ONE TABLET UNDER THE TONGUE EVERY 5 MINUTES AS NEEDED FOR CHEST PAIN.  DO NOT EXCEED A TOTAL OF 3 DOSES IN 15 MINUTES  25 each  3  . pantoprazole (PROTONIX) 40 MG tablet Take 1 tablet (40 mg total) by mouth at bedtime.  30 tablet  3  .  triamcinolone cream (KENALOG) 0.1 % Apply topically 3 (three) times daily. As needed for itching  30 g  0    Allergies  Allergen Reactions  . Penicillins Anaphylaxis  . Pneumococcal Vaccines     Pt states allergic to it.  . Shellfish Allergy Anaphylaxis    Family History  Problem Relation Age of Onset  . Stroke      BP 148/98  Pulse 100  Temp 97.3 F (36.3 C) (Oral)  Ht 5\' 10"  (1.778 m)  Wt 208 lb (94.348 kg)  BMI 29.84 kg/m2  SpO2 97%  Review of Systems He also has (solid=liquid) dysphagia at the level of the neck.  He has hoarseness.     Objective:   Physical Exam VITAL SIGNS:  See vs page. GENERAL: no distress. head: no deformity. eyes: no periorbital swelling, no proptosis.   external nose and ears are normal.   mouth: no lesion seen. Both eac's and tm's are normal. NECK: There is no palpable thyroid enlargement.  No thyroid nodule is palpable.  No palpable lymphadenopathy at the anterior neck. LUNGS:  Clear to auscultation.     Assessment & Plan:  [name], new Dysphagia, new, uncertain etiology.  ? Related to URI HTN, with probable  situational component

## 2012-07-13 NOTE — Patient Instructions (Addendum)
Refer to an ear-nose-throat specialist.  you will receive a phone call, about a day and time for an appointment. Here is a prescription for cough medication. Eat and drink slowly

## 2012-07-20 ENCOUNTER — Ambulatory Visit: Payer: Medicare Other | Admitting: Internal Medicine

## 2012-07-21 ENCOUNTER — Ambulatory Visit (INDEPENDENT_AMBULATORY_CARE_PROVIDER_SITE_OTHER): Payer: Medicare Other | Admitting: Internal Medicine

## 2012-07-21 ENCOUNTER — Telehealth: Payer: Self-pay | Admitting: Internal Medicine

## 2012-07-21 ENCOUNTER — Encounter: Payer: Self-pay | Admitting: Internal Medicine

## 2012-07-21 VITALS — BP 112/84 | HR 79 | Temp 98.1°F | Ht 71.0 in | Wt 203.8 lb

## 2012-07-21 DIAGNOSIS — K219 Gastro-esophageal reflux disease without esophagitis: Secondary | ICD-10-CM

## 2012-07-21 DIAGNOSIS — E785 Hyperlipidemia, unspecified: Secondary | ICD-10-CM

## 2012-07-21 DIAGNOSIS — R1310 Dysphagia, unspecified: Secondary | ICD-10-CM

## 2012-07-21 DIAGNOSIS — I10 Essential (primary) hypertension: Secondary | ICD-10-CM

## 2012-07-21 MED ORDER — FLUTICASONE PROPIONATE 50 MCG/ACT NA SUSP: 2.0000 | Freq: Every day | NASAL | Status: DC

## 2012-07-21 MED ORDER — SUCRALFATE 1 GM/10ML PO SUSP: 1.0000 g | Freq: Four times a day (QID) | ORAL | Status: DC

## 2012-07-21 MED ORDER — PANTOPRAZOLE SODIUM 40 MG PO TBEC: 40.0000 mg | DELAYED_RELEASE_TABLET | Freq: Two times a day (BID) | ORAL | Status: DC

## 2012-07-21 NOTE — Patient Instructions (Signed)
It was good to see you today. We have reviewed your prior records including labs and tests today Increase protonix for reflux to 2x/day Change syrup to carafte 4x/day (stop cough medication due to constipation) Your prescription(s) have been submitted to your pharmacy. Please take as directed and contact our office if you believe you are having problem(s) with the medication(s). we'll make referral to GI specialist for your swallow and cough problem as needed. Our office will contact you regarding appointment(s) once made. Please schedule followup in 6 months, call sooner if problems.

## 2012-07-21 NOTE — Assessment & Plan Note (Signed)
Increase PPI to BID and add carafate until GI eval - see above

## 2012-07-21 NOTE — Assessment & Plan Note (Signed)
Cards changed prava to atorva 04/2012 The current medical regimen is effective;  continue present plan and medications.

## 2012-07-21 NOTE — Assessment & Plan Note (Signed)
changed coreg to low dose atenolol 03/2012 follow up cards as planned for additional titration BP Readings from Last 3 Encounters:  07/21/12 112/84  07/13/12 148/98  07/05/12 144/83

## 2012-07-21 NOTE — Progress Notes (Signed)
  Subjective:    Patient ID: [name], male    DOB: Jun 15, 1951, 61 y.o.   MRN: [id number]  HPI  Here for follow up  - reviewed interval history  complains of dysphagia Worse with solids but also  Liquids Regurgitation after meals associated with hoarseness and sensation of mucus/gagging/cough Not relieved with syrup or antibiotics    Past Medical History  Diagnosis Date  . ALLERGIC RHINITIS   . CAD, NATIVE VESSEL     BMS to OM1 2001, DES to BMS 2005  . DIAB W/UNSPEC COMP TYPE II/UNSPEC TYPE UNCNTRL   . ERECTILE DYSFUNCTION   . GERD   . HYPERLIPIDEMIA-MIXED   . HYPERTENSION, BENIGN   . MORTON'S NEUROMA, RIGHT   . SHOULDER PAIN, RIGHT      Review of Systems  Constitutional: Positive for unexpected weight change. Negative for fever.  Respiratory: Positive for cough and choking. Negative for shortness of breath.   Gastrointestinal: Positive for constipation. Negative for abdominal pain and abdominal distention.       See HPI       Objective:   Physical Exam BP 112/84  Pulse 79  Temp 98.1 F (36.7 C) (Oral)  Ht 5\' 11"  (1.803 m)  Wt 203 lb 12.8 oz (92.443 kg)  BMI 28.42 kg/m2  SpO2 94% Wt Readings from Last 3 Encounters:  07/21/12 203 lb 12.8 oz (92.443 kg)  07/13/12 208 lb (94.348 kg)  06/30/12 209 lb (94.802 kg)   Constitutional:  He appears well-developed and well-nourished. No distress. Wife at side Neck: Normal range of motion. Neck supple. No JVD present. No thyromegaly present.  Cardiovascular: Normal rate, regular rhythm and normal heart sounds.  No murmur heard. no BLE edema Pulmonary/Chest: Effort normal and breath sounds normal. No respiratory distress. no wheezes.  Abdominal: Soft. Bowel sounds are normal. Patient exhibits no distension. There is no tenderness.  Psychiatric: he has a normal mood and affect. behavior is normal. Judgment and thought content normal.   Lab Results  Component Value Date   WBC 7.1 04/07/2012   HGB 15.1 04/07/2012     HCT 43.8 04/07/2012   PLT 202 04/07/2012   GLUCOSE 153* 04/08/2012   CHOL 194 04/08/2012   TRIG 120 04/08/2012   HDL 64 04/08/2012   LDLDIRECT 112.2 02/25/2010   LDLCALC 106* 04/08/2012   ALT 44 04/08/2012   AST 26 04/08/2012   NA 133* 04/08/2012   K 3.5 04/08/2012   CL 98 04/08/2012   CREATININE 1.05 04/08/2012   BUN 15 04/08/2012   CO2 26 04/08/2012   PSA 0.16 04/13/2010   INR 0.99 04/07/2012   HGBA1C 9.0* 04/07/2012       Assessment & Plan:   Dysphagia with regurgitation and weight loss, ongoing > 2 mo - refer to GI, continue PPI, increase to BID and start carafate (see GERD below)  Note pt is seeing ENT today (rosen) due to overlap with "cough" symptoms - but advise DC of tussionex due to a) ineffective relief and b) constipation side effects

## 2012-07-21 NOTE — Telephone Encounter (Signed)
Caller: Costantino/Patient; Patient Name: [name]; PCP: [name] (Adults only); Best Callback Phone Number: [phone]. Call regarding: Follow-up from office visit on 8-30.  Per Patient, Dr Asa Lente wanted ENT information  to be referred for CT SCAN, ENT is  Dr Constance Holster.

## 2012-07-23 DIAGNOSIS — I639 Cerebral infarction, unspecified: Secondary | ICD-10-CM

## 2012-07-23 HISTORY — DX: Cerebral infarction, unspecified: I63.9

## 2012-07-25 ENCOUNTER — Other Ambulatory Visit: Payer: Self-pay | Admitting: Otolaryngology

## 2012-07-25 ENCOUNTER — Encounter: Payer: Self-pay | Admitting: Gastroenterology

## 2012-07-25 ENCOUNTER — Ambulatory Visit
Admission: RE | Admit: 2012-07-25 | Discharge: 2012-07-25 | Disposition: A | Discharge status: Home / Self Care | Payer: Medicare Other | Source: Ambulatory Visit | Attending: Otolaryngology | Admitting: Otolaryngology

## 2012-07-25 DIAGNOSIS — D490 Neoplasm of unspecified behavior of digestive system: Secondary | ICD-10-CM

## 2012-07-25 MED ORDER — IOHEXOL 300 MG/ML  SOLN
75.0000 mL | Freq: Once | INTRAMUSCULAR | Status: AC | PRN
  Administered 2012-07-25: 75 mL via INTRAVENOUS

## 2012-07-25 NOTE — Telephone Encounter (Signed)
Thanks - noted - as pt seeing ENT, also refer to GI (as ordered last week)

## 2012-07-31 ENCOUNTER — Telehealth: Payer: Self-pay

## 2012-07-31 ENCOUNTER — Other Ambulatory Visit: Payer: Self-pay | Admitting: Internal Medicine

## 2012-07-31 NOTE — Telephone Encounter (Signed)
Pt on Plavix in place of ASA since 03/2012 TIA -  Stopping Plavix places pt at increase risk for recurrent CVA or TIA. However, if there is urgent need for surgery, may stop plavix for 3-5 days prior to surgery, and resume asap after surgery when felt safe by surgeon

## 2012-07-31 NOTE — Telephone Encounter (Signed)
Dr Janeice Robinson office called requesting advisement on pt Plavix prior to vocal cord surgery under general anesthesia.

## 2012-08-01 NOTE — Telephone Encounter (Signed)
Called [name] back had to leave msg on her voice mail. Left md advisement....[name]

## 2012-08-02 ENCOUNTER — Encounter (HOSPITAL_COMMUNITY): Payer: Self-pay | Admitting: Pharmacy Technician

## 2012-08-03 NOTE — H&P (Signed)
Assessment  . Dysphagia   (787.20) . 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 Recommend CT evaluation of the neck and upper chest to evaluate both of these problems. We will discuss future workup following that. Recommend a chin tuck when he eats and drinks to help avoid aspiration. Reason For Visit  [name] is here today at the kind request of [name] for consultation and opinion for pain in throat and difficulty swallowing. HPI  2 month history of sore throat, trouble swallowing. He coughs when he drinks liquids. He feels that it's going down the wrong pipe. For the past 2 weeks, his voice has been severely diminished. She took some antibiotics which seemed to help a little bit. He quit smoking years ago. He doesn't drink any longer. Allergies  Penicillins Pneumococcal Vaccines Shellfish. Current Meds  Atenolol TABS;; RPT Lipitor TABS;; RPT Plavix TABS;; RPT DiphenhydrAMINE HCl TABS;; RPT Fexofenadine HCl TABS;; RPT Hydrochlorothiazide TABS;; RPT NovoLOG SOLN;; RPT Lantus SOLN;; RPT Nitrostat SUBL;; RPT Protonix TBEC;; RPT Triamcinolone Acetonide CREA;; RPT. Active Problems  Diabetes Mellitus (250.00) Hypertension (401.9). [name]  Cath Stent Walker; angioplasty also Knee Surgery. Family Hx  Family history of Alzheimer Disease Family history of Cancer Family history of Coronary Artery Disease Family history of Diabetes Mellitus Family history of Hypertension Family history of Migraine Headache Family history of Reported Family History Of Bleeding Problems Family history of Stroke Syndrome. Personal Hx  Former Smoker [phone]); 1984 Never Drank Alcohol. ROS  Systemic: Feeling tired (fatigue), fever, night sweats, and recent weight loss. Head: Headache. Eyes: No eye symptoms. Otolaryngeal:  No hearing loss  and no earache.  Tinnitus  and purulent nasal discharge.  No nasal passage blockage (stuffiness).  Snoring, sneezing, hoarseness, and sore throat. Cardiovascular: No chest pain or discomfort  and no palpitations. Pulmonary: Dyspnea, cough, and wheezing. Gastrointestinal: Dysphagia.  No heartburn.  Nausea.  No abdominal pain.  Melena.  No diarrhea. Genitourinary: No dysuria. Endocrine: Muscle weakness. Musculoskeletal: Calf muscle cramps  and arthralgias.  No soft tissue swelling. Neurological: No dizziness  and no fainting.  Tingling  and numbness. Psychological: No anxiety  and no depression. Skin: No rash. 12 system ROS was obtained and reviewed on the Health Maintenance form dated today.  Positive responses are shown above.  If the symptom is not checked, the patient has denied it. Vital Signs   Recorded by [name] on 21 Jul 2012 04:34 PM BP:120/72,  Height: 70 in, Weight: 208 lb, BMI: 29.8 kg/m2,  BSA Calculated: 2.12 ,  BMI Calculated: 29.84. Physical Exam  APPEARANCE: Well developed, well nourished, in no acute distress.  Normal affect, in a pleasant mood.  Oriented to time, place and person. COMMUNICATION: hoarse and breathy voice   HEAD & FACE:  No scars, lesions or masses of head and face.  Sinuses nontender to palpation.  Salivary glands without mass or tenderness.  Facial strength symmetric.  No facial lesion, scars, or mass. EYES: EOMI with normal primary gaze alignment. Visual acuity grossly intact.  PERRLA EXTERNAL EAR & NOSE: No scars, lesions or masses  EAC & TYMPANIC MEMBRANE:  EAC shows no obstructing lesions or debris and tympanic membranes are normal bilaterally with good movement to insufflation. GROSS HEARING: Normal   TMJ:  Nontender  INTRANASAL EXAM: No polyps or purulence.  NASOPHARYNX: Normal, without lesions.  LIPS, TEETH & GUMS: No lip lesions, and normal gums. ORAL CAVITY/OROPHARYNX:  Oral mucosa moist without lesion or asymmetry of  the palate, tongue, tonsil or posterior pharynx. Very strong gag reflex but does not permit indirect laryngoscopy. NECK:  Supple without adenopathy or mass. THYROID:  Normal with no masses palpable.  NEUROLOGIC:  No gross CN deficits. No nystagmus noted.   LYMPHATIC:  No enlarged nodes palpable. Procedure  Fiberoptic Laryngoscopy Name: [name]     Age: 61 year   Performing Provider: Izora Walker The risks of the procedure are minimal and were discussed with the patient today. Pre-op Diagnosis: hoarseness  dysphagia  Post-op Diagnosis:Same Allergy:  reviewed allergies as listed Nasal Prep:Lidocaine/Afrin   Procedure:     With the patient seated in the exam chair, the R nasal cavity was intubated with the flexible laryngoscope.  The nasal cavity mucosa, nasopharynx, hypopharynx and larynx were all examined with findings as noted.  The scope was then removed.  The patient tolerated the procedure well without complication or blood loss (unless indicated in findings).   FINDINGS: Nasopharynx clear. No evidence of laryngeal tumor. Left vocal cord paralyzed. Right side moves normally. Possible mass in the left oropharynx-base of tongue and lateral pharyngeal wall. No other masses identified.    . Signature  Electronically signed by : [name]  M.D.; 07/21/2012 5:16 PM EST.

## 2012-08-04 ENCOUNTER — Encounter (HOSPITAL_COMMUNITY): Payer: Self-pay

## 2012-08-04 ENCOUNTER — Encounter (HOSPITAL_COMMUNITY)
Admission: RE | Admit: 2012-08-04 | Discharge: 2012-08-04 | Disposition: A | Discharge status: Home / Self Care | Payer: Medicare Other | Source: Ambulatory Visit | Attending: Otolaryngology | Admitting: Otolaryngology

## 2012-08-04 HISTORY — DX: Dorsalgia, unspecified: M54.9

## 2012-08-04 HISTORY — DX: Polyneuropathy, unspecified: G62.9

## 2012-08-04 HISTORY — DX: Other seasonal allergic rhinitis: J30.2

## 2012-08-04 HISTORY — DX: Other chronic pain: G89.29

## 2012-08-04 HISTORY — DX: Constipation, unspecified: K59.00

## 2012-08-04 LAB — BASIC METABOLIC PANEL
BUN: 18 mg/dL (ref 6–23)
CO2: 30 mEq/L (ref 19–32)
Calcium: 10 mg/dL (ref 8.4–10.5)
Chloride: 101 mEq/L (ref 96–112)
Creatinine, Ser: 1.12 mg/dL (ref 0.50–1.35)
GFR calc Af Amer: 80 mL/min — ABNORMAL LOW (ref >90)
GFR calc non Af Amer: 69 mL/min — ABNORMAL LOW (ref >90)
Glucose, Bld: 233 mg/dL — ABNORMAL HIGH (ref 70–99)
Potassium: 4.2 mEq/L (ref 3.5–5.1)
Sodium: 138 mEq/L (ref 135–145)

## 2012-08-04 LAB — CBC
HCT: 39.1 % (ref 39.0–52.0)
Hemoglobin: 13.3 g/dL (ref 13.0–17.0)
MCH: 30.4 pg (ref 26.0–34.0)
MCHC: 34 g/dL (ref 30.0–36.0)
MCV: 89.3 fL (ref 78.0–100.0)
Platelets: 219 10*3/uL (ref 150–400)
RBC: 4.38 MIL/uL (ref 4.22–5.81)
RDW: 11.9 % (ref 11.5–15.5)
WBC: 6.6 10*3/uL (ref 4.0–10.5)

## 2012-08-04 LAB — ABO/RH: ABO/RH(D): O POS

## 2012-08-04 LAB — SURGICAL PCR SCREEN
MRSA, PCR: NEGATIVE
Staphylococcus aureus: NEGATIVE

## 2012-08-04 LAB — TYPE AND SCREEN
ABO/RH(D): O POS
Antibody Screen: NEGATIVE

## 2012-08-04 NOTE — Pre-Procedure Instructions (Signed)
[place]  08/04/2012   Your procedure is scheduled on:  Mon, Sept 16 @ 10:30 AM  Report to [facility] at 8:30 AM.  Call this number if you have problems the morning of surgery: [phone]   Remember:   Do not eat food:After Midnight.    Take these medicines the morning of surgery with A SIP OF WATER: Atenolol(Tenormin),Allegra(Fexofenadine),Flonase(Fluticasone), and Protonix(Pantoprazole)   Do not wear jewelry  Do not wear lotions, powders, or  colognes. You may wear deodorant.   Men may shave face and neck.  Do not bring valuables to the hospital.  Contacts, dentures or bridgework may not be worn into surgery.  Leave suitcase in the car. After surgery it may be brought to your room.  For patients admitted to the hospital, checkout time is 11:00 AM the day of discharge.   Patients discharged the day of surgery will not be allowed to drive home.    Special Instructions: CHG Shower Use Special Wash: 1/2 bottle night before surgery and 1/2 bottle morning of surgery.   Please read over the following fact sheets that you were given: Pain Booklet, Coughing and Deep Breathing, Blood Transfusion Information, MRSA Information and Surgical Site Infection Prevention

## 2012-08-04 NOTE — Progress Notes (Signed)
Dr.Nasher is cardiologist and last visit was in June 2013 with report in epic  Echo and stress test in epic from 2013 Heart cath in epic from 2005  Medical MD is DR.[name]  EKG in epic from 04/08/12 CXR in epic from 07/05/12

## 2012-08-04 NOTE — Progress Notes (Signed)
Hasn't had any Nitroglycerin in years

## 2012-08-04 NOTE — Progress Notes (Signed)
Average fasting sugar runs around 120

## 2012-08-06 MED ORDER — VANCOMYCIN HCL IN DEXTROSE 1-5 GM/200ML-% IV SOLN
1000.0000 mg | INTRAVENOUS | Status: AC
  Administered 2012-08-07: 1000 mg via INTRAVENOUS

## 2012-08-07 ENCOUNTER — Encounter (HOSPITAL_COMMUNITY): Payer: Self-pay | Admitting: *Deleted

## 2012-08-07 ENCOUNTER — Encounter (HOSPITAL_COMMUNITY)
Admission: RE | Disposition: A | Discharge status: Home / Self Care | Payer: Self-pay | Source: Ambulatory Visit | Attending: Otolaryngology

## 2012-08-07 ENCOUNTER — Ambulatory Visit (HOSPITAL_COMMUNITY)
Admission: RE | Admit: 2012-08-07 | Discharge: 2012-08-08 | Disposition: A | Discharge status: Home / Self Care | Payer: Medicare Other | Source: Ambulatory Visit | Attending: Otolaryngology | Admitting: Otolaryngology

## 2012-08-07 ENCOUNTER — Encounter (HOSPITAL_COMMUNITY): Payer: Self-pay | Admitting: Anesthesiology

## 2012-08-07 ENCOUNTER — Ambulatory Visit (HOSPITAL_COMMUNITY): Payer: Medicare Other | Admitting: Anesthesiology

## 2012-08-07 DIAGNOSIS — I10 Essential (primary) hypertension: Secondary | ICD-10-CM | POA: Insufficient documentation

## 2012-08-07 DIAGNOSIS — R1310 Dysphagia, unspecified: Secondary | ICD-10-CM | POA: Insufficient documentation

## 2012-08-07 DIAGNOSIS — J3800 Paralysis of vocal cords and larynx, unspecified: Secondary | ICD-10-CM

## 2012-08-07 DIAGNOSIS — Z01812 Encounter for preprocedural laboratory examination: Secondary | ICD-10-CM | POA: Insufficient documentation

## 2012-08-07 DIAGNOSIS — E119 Type 2 diabetes mellitus without complications: Secondary | ICD-10-CM | POA: Insufficient documentation

## 2012-08-07 DIAGNOSIS — J3801 Paralysis of vocal cords and larynx, unilateral: Secondary | ICD-10-CM | POA: Insufficient documentation

## 2012-08-07 HISTORY — PX: LARYNGOPLASTY: SHX282

## 2012-08-07 LAB — GLUCOSE, CAPILLARY
Glucose-Capillary: 302 mg/dL — ABNORMAL HIGH (ref 70–99)
Glucose-Capillary: 249 mg/dL — ABNORMAL HIGH (ref 70–99)
Glucose-Capillary: 216 mg/dL — ABNORMAL HIGH (ref 70–99)
Glucose-Capillary: 165 mg/dL — ABNORMAL HIGH (ref 70–99)
Glucose-Capillary: 184 mg/dL — ABNORMAL HIGH (ref 70–99)
Glucose-Capillary: 213 mg/dL — ABNORMAL HIGH (ref 70–99)
Glucose-Capillary: 170 mg/dL — ABNORMAL HIGH (ref 70–99)

## 2012-08-07 SURGERY — LARYNGOPLASTY
Anesthesia: General | Site: Throat | Laterality: Left | Wound class: Clean Contaminated

## 2012-08-07 MED ORDER — SODIUM CHLORIDE 0.9 % IR SOLN
Status: DC | PRN
  Administered 2012-08-07: 1

## 2012-08-07 MED ORDER — MORPHINE SULFATE 2 MG/ML IJ SOLN
2.0000 mg | Freq: Once | INTRAMUSCULAR | Status: AC
  Administered 2012-08-07: 2 mg via INTRAVENOUS

## 2012-08-07 MED ORDER — FENTANYL CITRATE 0.05 MG/ML IJ SOLN
25.0000 ug | INTRAMUSCULAR | Status: DC | PRN
  Administered 2012-08-07 (×5): 25 ug via INTRAVENOUS

## 2012-08-07 MED ORDER — INSULIN ASPART 100 UNIT/ML ~~LOC~~ SOLN
0.0000 [IU] | SUBCUTANEOUS | Status: DC
  Administered 2012-08-07: 5 [IU] via SUBCUTANEOUS
  Administered 2012-08-07: 3 [IU] via SUBCUTANEOUS
  Administered 2012-08-08: 2 [IU] via SUBCUTANEOUS
  Administered 2012-08-08: 3 [IU] via SUBCUTANEOUS

## 2012-08-07 MED ORDER — MIDAZOLAM HCL 5 MG/5ML IJ SOLN
INTRAMUSCULAR | Status: DC | PRN
  Administered 2012-08-07: 1 mg via INTRAVENOUS

## 2012-08-07 MED ORDER — ATENOLOL 25 MG PO TABS
25.0000 mg | ORAL_TABLET | Freq: Every day | ORAL | Status: DC
  Administered 2012-08-07: 25 mg via ORAL
  Filled 2012-08-07 (×2): qty 1

## 2012-08-07 MED ORDER — CLOPIDOGREL BISULFATE 75 MG PO TABS
75.0000 mg | ORAL_TABLET | Freq: Every day | ORAL | Status: DC
  Administered 2012-08-07 – 2012-08-08 (×2): 75 mg via ORAL
  Filled 2012-08-07 (×2): qty 1

## 2012-08-07 MED ORDER — INSULIN ASPART 100 UNIT/ML ~~LOC~~ SOLN: 2.0000 [IU] | Freq: Three times a day (TID) | SUBCUTANEOUS | Status: DC

## 2012-08-07 MED ORDER — LACTATED RINGERS IV SOLN
INTRAVENOUS | Status: DC
  Administered 2012-08-07: 10:00:00 via INTRAVENOUS

## 2012-08-07 MED ORDER — MORPHINE SULFATE 2 MG/ML IJ SOLN
INTRAMUSCULAR | Status: AC
  Filled 2012-08-07: qty 1

## 2012-08-07 MED ORDER — DIPHENHYDRAMINE HCL 25 MG PO CAPS: 25.0000 mg | ORAL_CAPSULE | Freq: Four times a day (QID) | ORAL | Status: DC | PRN

## 2012-08-07 MED ORDER — FENTANYL CITRATE 0.05 MG/ML IJ SOLN
INTRAMUSCULAR | Status: DC | PRN
  Administered 2012-08-07 (×2): 25 ug via INTRAVENOUS

## 2012-08-07 MED ORDER — OXYMETAZOLINE HCL 0.05 % NA SOLN: 2.0000 | NASAL | Status: DC

## 2012-08-07 MED ORDER — FENTANYL CITRATE 0.05 MG/ML IJ SOLN
INTRAMUSCULAR | Status: AC
  Filled 2012-08-07: qty 2

## 2012-08-07 MED ORDER — LACTATED RINGERS IV SOLN
INTRAVENOUS | Status: DC | PRN
  Administered 2012-08-07: 11:00:00 via INTRAVENOUS

## 2012-08-07 MED ORDER — LORATADINE 10 MG PO TABS
10.0000 mg | ORAL_TABLET | Freq: Every day | ORAL | Status: DC
  Administered 2012-08-08: 10 mg via ORAL
  Filled 2012-08-07 (×2): qty 1

## 2012-08-07 MED ORDER — NITROGLYCERIN 0.4 MG SL SUBL: 0.4000 mg | SUBLINGUAL_TABLET | SUBLINGUAL | Status: DC | PRN

## 2012-08-07 MED ORDER — LIDOCAINE-EPINEPHRINE 1 %-1:100000 IJ SOLN
INTRAMUSCULAR | Status: AC
  Filled 2012-08-07: qty 1

## 2012-08-07 MED ORDER — PROMETHAZINE HCL 25 MG RE SUPP: 25.0000 mg | Freq: Four times a day (QID) | RECTAL | Status: DC | PRN

## 2012-08-07 MED ORDER — HYDROCODONE-ACETAMINOPHEN 5-325 MG PO TABS
1.0000 | ORAL_TABLET | ORAL | Status: DC | PRN
  Administered 2012-08-07 – 2012-08-08 (×2): 2 via ORAL
  Filled 2012-08-07 (×2): qty 2

## 2012-08-07 MED ORDER — LIDOCAINE HCL 2 % EX GEL
CUTANEOUS | Status: DC | PRN
  Administered 2012-08-07: 1 via TOPICAL

## 2012-08-07 MED ORDER — HYDROCHLOROTHIAZIDE 12.5 MG PO CAPS
12.5000 mg | ORAL_CAPSULE | Freq: Every day | ORAL | Status: DC
  Administered 2012-08-07 – 2012-08-08 (×2): 12.5 mg via ORAL
  Filled 2012-08-07 (×2): qty 1

## 2012-08-07 MED ORDER — BACITRACIN ZINC 500 UNIT/GM EX OINT
TOPICAL_OINTMENT | CUTANEOUS | Status: AC
  Filled 2012-08-07: qty 15

## 2012-08-07 MED ORDER — SUCRALFATE 1 GM/10ML PO SUSP
1.0000 g | Freq: Four times a day (QID) | ORAL | Status: DC
  Administered 2012-08-07 – 2012-08-08 (×2): 1 g via ORAL
  Filled 2012-08-07 (×7): qty 10

## 2012-08-07 MED ORDER — INSULIN ASPART 100 UNIT/ML ~~LOC~~ SOLN
8.0000 [IU] | Freq: Once | SUBCUTANEOUS | Status: AC
  Administered 2012-08-07: 8 [IU] via SUBCUTANEOUS

## 2012-08-07 MED ORDER — PROMETHAZINE HCL 25 MG PO TABS
25.0000 mg | ORAL_TABLET | Freq: Four times a day (QID) | ORAL | Status: DC | PRN
  Administered 2012-08-08: 25 mg via ORAL
  Filled 2012-08-07: qty 1

## 2012-08-07 MED ORDER — IBUPROFEN 100 MG/5ML PO SUSP
400.0000 mg | Freq: Four times a day (QID) | ORAL | Status: DC | PRN
  Filled 2012-08-07: qty 20

## 2012-08-07 MED ORDER — SIMVASTATIN 5 MG PO TABS
5.0000 mg | ORAL_TABLET | Freq: Every day | ORAL | Status: DC
  Administered 2012-08-07: 5 mg via ORAL
  Filled 2012-08-07 (×2): qty 1

## 2012-08-07 MED ORDER — ONDANSETRON HCL 4 MG/2ML IJ SOLN
INTRAMUSCULAR | Status: DC | PRN
  Administered 2012-08-07: 4 mg via INTRAVENOUS

## 2012-08-07 MED ORDER — FLUTICASONE PROPIONATE 50 MCG/ACT NA SUSP
2.0000 | Freq: Every day | NASAL | Status: DC
  Administered 2012-08-08: 2 via NASAL
  Filled 2012-08-07: qty 16

## 2012-08-07 MED ORDER — LACTATED RINGERS IV SOLN
INTRAVENOUS | Status: DC
  Administered 2012-08-07 – 2012-08-08 (×2): via INTRAVENOUS

## 2012-08-07 MED ORDER — PANTOPRAZOLE SODIUM 40 MG PO TBEC
40.0000 mg | DELAYED_RELEASE_TABLET | Freq: Two times a day (BID) | ORAL | Status: DC
  Administered 2012-08-07 – 2012-08-08 (×2): 40 mg via ORAL
  Filled 2012-08-07 (×2): qty 1

## 2012-08-07 MED ORDER — LISINOPRIL 40 MG PO TABS
40.0000 mg | ORAL_TABLET | Freq: Every day | ORAL | Status: DC
  Administered 2012-08-07 – 2012-08-08 (×2): 40 mg via ORAL
  Filled 2012-08-07 (×2): qty 1

## 2012-08-07 MED ORDER — DIPHENHYDRAMINE HCL 25 MG PO TABS
25.0000 mg | ORAL_TABLET | Freq: Four times a day (QID) | ORAL | Status: DC | PRN
  Filled 2012-08-07: qty 1

## 2012-08-07 MED ORDER — DEXTROSE-NACL 5-0.9 % IV SOLN
INTRAVENOUS | Status: DC
  Administered 2012-08-07: 15:00:00 via INTRAVENOUS

## 2012-08-07 MED ORDER — LIDOCAINE-EPINEPHRINE 1 %-1:100000 IJ SOLN
INTRAMUSCULAR | Status: DC | PRN
  Administered 2012-08-07: 20 mL

## 2012-08-07 MED ORDER — CLINDAMYCIN PALMITATE HCL 75 MG/5ML PO SOLR
300.0000 mg | Freq: Three times a day (TID) | ORAL | Status: DC
  Administered 2012-08-07 – 2012-08-08 (×2): 300 mg via ORAL
  Filled 2012-08-07 (×6): qty 20

## 2012-08-07 SURGICAL SUPPLY — 57 items
ADH SKN CLS APL DERMABOND .7 (GAUZE/BANDAGES/DRESSINGS) ×1
BLADE SURG 10 STRL SS (BLADE) ×2 IMPLANT
BLADE SURG 15 STRL LF DISP TIS (BLADE) IMPLANT
BLADE SURG 15 STRL SS (BLADE) ×2
BUR 7 SOFT (BURR) ×1 IMPLANT
BUR CROSS CUT FISSURE 1.6 (BURR) IMPLANT
CANISTER SUCTION 2500CC (MISCELLANEOUS) ×2 IMPLANT
CLEANER TIP ELECTROSURG 2X2 (MISCELLANEOUS) ×2 IMPLANT
CLOTH BEACON ORANGE TIMEOUT ST (SAFETY) ×2 IMPLANT
CORDS BIPOLAR (ELECTRODE) ×1 IMPLANT
COVER MAYO STAND STRL (DRAPES) ×1 IMPLANT
COVER SURGICAL LIGHT HANDLE (MISCELLANEOUS) ×1 IMPLANT
DECANTER SPIKE VIAL GLASS SM (MISCELLANEOUS) ×1 IMPLANT
DERMABOND ADVANCED (GAUZE/BANDAGES/DRESSINGS) ×1
DERMABOND ADVANCED .7 DNX12 (GAUZE/BANDAGES/DRESSINGS) ×1 IMPLANT
ELECT COATED BLADE 2.86 ST (ELECTRODE) ×2 IMPLANT
ELECT NDL TIP 2.8 STRL (NEEDLE) IMPLANT
ELECT NEEDLE TIP 2.8 STRL (NEEDLE) IMPLANT
ELECT REM PT RETURN 9FT ADLT (ELECTROSURGICAL) ×2
ELECTRODE REM PT RTRN 9FT ADLT (ELECTROSURGICAL) ×1 IMPLANT
GLOVE BIO SURGEON STRL SZ 6.5 (GLOVE) ×1 IMPLANT
GLOVE BIO SURGEON STRL SZ7.5 (GLOVE) ×1 IMPLANT
GLOVE BIOGEL PI IND STRL 6.5 (GLOVE) IMPLANT
GLOVE BIOGEL PI IND STRL 7.5 (GLOVE) IMPLANT
GLOVE BIOGEL PI INDICATOR 6.5 (GLOVE) ×1
GLOVE BIOGEL PI INDICATOR 7.5 (GLOVE) ×1
GLOVE ECLIPSE 7.5 STRL STRAW (GLOVE) ×3 IMPLANT
GLOVE SURG SS PI 6.5 STRL IVOR (GLOVE) ×1 IMPLANT
GOWN STRL NON-REIN LRG LVL3 (GOWN DISPOSABLE) ×6 IMPLANT
KIT BASIN OR (CUSTOM PROCEDURE TRAY) ×2 IMPLANT
KIT ROOM TURNOVER OR (KITS) ×2 IMPLANT
MONOJECT CONTROL SYRINGE ×1 IMPLANT
NDL 25GX 5/8IN NON SAFETY (NEEDLE) IMPLANT
NDL 27GX1/2 REG BEVEL ECLIP (NEEDLE) IMPLANT
NEEDLE 25GX 5/8IN NON SAFETY (NEEDLE) IMPLANT
NEEDLE 27GAX1X1/2 (NEEDLE) ×3 IMPLANT
NEEDLE 27GX1/2 REG BEVEL ECLIP (NEEDLE) ×2 IMPLANT
NS IRRIG 1000ML POUR BTL (IV SOLUTION) ×2 IMPLANT
PAD ARMBOARD 7.5X6 YLW CONV (MISCELLANEOUS) ×4 IMPLANT
PENCIL FOOT CONTROL (ELECTRODE) ×2 IMPLANT
RUBBERBAND STERILE (MISCELLANEOUS) ×1 IMPLANT
SPONGE GAUZE 4X4 12PLY (GAUZE/BANDAGES/DRESSINGS) ×1 IMPLANT
SUT CHROMIC 3 0 SH 27 (SUTURE) ×1 IMPLANT
SUT ETHILON 4 0 PS 2 18 (SUTURE) IMPLANT
SUT ETHILON 5 0 P 3 18 (SUTURE)
SUT NYLON ETHILON 5-0 P-3 1X18 (SUTURE) IMPLANT
SUT VIC AB 3-0 SH 27 (SUTURE)
SUT VIC AB 3-0 SH 27XBRD (SUTURE) IMPLANT
SYR CONTROL 10ML LL (SYRINGE) ×1 IMPLANT
Silastin Block (Block) ×1 IMPLANT
TAPE SURG TRANSPORE 1 IN (GAUZE/BANDAGES/DRESSINGS) IMPLANT
TAPE SURGICAL TRANSPORE 1 IN (GAUZE/BANDAGES/DRESSINGS) ×1
TOWEL OR 17X24 6PK STRL BLUE (TOWEL DISPOSABLE) ×2 IMPLANT
TOWEL OR 17X26 10 PK STRL BLUE (TOWEL DISPOSABLE) ×2 IMPLANT
TOWEL OR NON WOVEN STRL DISP B (DISPOSABLE) ×1 IMPLANT
TRAY ENT MC OR (CUSTOM PROCEDURE TRAY) ×2 IMPLANT
WATER STERILE IRR 1000ML POUR (IV SOLUTION) ×2 IMPLANT

## 2012-08-07 NOTE — Anesthesia Procedure Notes (Signed)
Procedure Name: MAC Date/Time: 08/07/2012 11:20 AM Performed by: Sherilyn Banker Pre-anesthesia Checklist: Patient identified, Emergency Drugs available, Suction available, Patient being monitored and Timeout performed Patient Re-evaluated:Patient Re-evaluated prior to inductionOxygen Delivery Method: Nasal cannula Preoxygenation: Pre-oxygenation with 100% oxygen

## 2012-08-07 NOTE — Preoperative (Addendum)
Beta Blockers   Reason not to administer Beta Blockers:beta blocker d/c

## 2012-08-07 NOTE — Transfer of Care (Signed)
Immediate Anesthesia Transfer of Care Note  Patient: [name]  Procedure(s) Performed: Procedure(s) (LRB) with comments: LARYNGOPLASTY (Left) - Left Vocal Cord Medialyzation  Patient Location: PACU  Anesthesia Type: MAC  Level of Consciousness: awake and patient cooperative  Airway & Oxygen Therapy: Patient Spontanous Breathing and Patient connected to nasal cannula oxygen  Post-op Assessment: Report given to PACU RN, Post -op Vital signs reviewed and stable and Patient moving all extremities X 4  Post vital signs: Reviewed and stable  Complications: No apparent anesthesia complications

## 2012-08-07 NOTE — Anesthesia Postprocedure Evaluation (Signed)
  Anesthesia Post-op Note  Patient: [name]  Procedure(s) Performed: Procedure(s) (LRB) with comments: LARYNGOPLASTY (Left) - Left Vocal Cord Medialyzation  Patient Location: PACU  Anesthesia Type: MAC  Level of Consciousness: awake, alert  and oriented  Airway and Oxygen Therapy: Patient Spontanous Breathing and Patient connected to nasal cannula oxygen  Post-op Pain: mild  Post-op Assessment: Post-op Vital signs reviewed, Patient's Cardiovascular Status Stable, Respiratory Function Stable, Patent Airway and No signs of Nausea or vomiting  Post-op Vital Signs: Reviewed and stable  Complications: No apparent anesthesia complications

## 2012-08-07 NOTE — Progress Notes (Signed)
Called Dr. Ola Spurr at [phone] about CBG 302-order received, Novolog given per MD order.

## 2012-08-07 NOTE — Progress Notes (Signed)
   ENT Progress Note: s/p Procedure(s): LARYNGOPLASTY   Subjective: C/o mild sore throat, tol soft po  Objective: Vital signs in last 24 hours: Temp:  [97.2 F (36.2 C)-97.8 F (36.6 C)] 97.7 F (36.5 C) (09/16 1449) Pulse Rate:  [55-74] 55  (09/16 1449) Resp:  [14-20] 18  (09/16 1449) BP: (149-185)/(86-107) 169/95 mmHg (09/16 1449) SpO2:  [98 %-100 %] 100 % (09/16 1449) Weight change:  Last BM Date: 08/07/12  Intake/Output from previous day:   Intake/Output this shift: Total I/O In: 700 [I.V.:700] Out: -   Labs: No results found for this basename: WBC:2,HGB:2,HCT:2,PLT:2 in the last 72 hours No results found for this basename: NA:2,K:2,CL:2,CO2:2,GLUCOSE:2,BUN:2,CREATININR:2,CALCIUM:2 in the last 72 hours  Studies/Results: No results found.   PHYSICAL EXAM: Inc intact Min d/c,No swelling Airway stable   Assessment/Plan: Stable post op Oral intake as tol Monitor O/N    Noble Cicalese 08/07/2012, 3:58 PM

## 2012-08-07 NOTE — Op Note (Signed)
OPERATIVE REPORT  DATE OF SURGERY: 08/07/2012  PATIENT:  [name],  61 y.o. male  PRE-OPERATIVE DIAGNOSIS:  Vocal Cord Paralysis  POST-OPERATIVE DIAGNOSIS:  Vocal Cord Paralysis  PROCEDURE:  Procedure(s): LARYNGOPLASTY  SURGEON:  Beckie Salts, MD  ASSISTANTS: none   ANESTHESIA:   local  EBL:  10 ml  DRAINS: Sterile rubber band   LOCAL MEDICATIONS USED:  OTHER lidocaine with epinephrine  SPECIMEN:  No Specimen  COUNTS:  YES  PROCEDURE DETAILS: The patient was taken to the operating room and placed on the operating table in the supine position. The nasal cavity was anesthetized with topical viscous lidocaine and a flexible fiberoptic scope was passed down the left nasal cavity and used to visualize the larynx. This was placed on a Mayo stand above the head and kept in place throughout the procedure to visualize the cords. The left vocal cord was paralyzed in the paramedian position. There was incomplete closure of the cords when he terminated.  A transverse incision was outlined about the mid-level of the thyroid cartilage with a marking pen. Xylocaine with epinephrine was infiltrated. Patient was prepped and draped in a standard fashion. 15 scalpel was used to incise the skin and subcutaneous tissue. Electrocautery was used to continue the dissection down exposing the diastases of the strap muscles. This was divided exposing the thyroid cartilage. The musculature was dissected off of the left thyroid cartilage using electrocautery. The proposed window was outlined with electrocautery about 3-4 mm above the inferior edge of the left thyroid lamina, about 5 mm lateral to the midline, and a total of about 6 mm in width and 12 mm in length. A 7 mm round cutting bur was used on electric drill to remove the cartilage. The inner perichondrium was identified and was elevated off the inner surface of the thyroid lamina using a Surveyor, quantity. Silastic block was cut to size and shape  and inserted, re size and replaced until the results for optimal. He had a good strong voice at this point and had very nice closure of the cords. The wound was irrigated with saline. 3-0 chromic was used to reapproximate the outer perichondrium and strap muscle layer. Subcuticular closure was accomplished in the same way. A rubber band drain was left in the center of the wound. Dermabond was used on the skin and a dressing was applied. He was transferred to recovery in stable condition.   PATIENT DISPOSITION:  PACU - hemodynamically stable.

## 2012-08-07 NOTE — Interval H&P Note (Signed)
History and Physical Interval Note:  08/07/2012 10:50 AM  [name]  has presented today for surgery, with the diagnosis of vocal cord paralysis  The various methods of treatment have been discussed with the patient and family. After consideration of risks, benefits and other options for treatment, the patient has consented to  Procedure(s) (LRB) with comments: LARYNGOPLASTY (Left) - left vocal cord medialyzation as a surgical intervention .  The patient's history has been reviewed, patient examined, no change in status, stable for surgery.  I have reviewed the patient's chart and labs.  Questions were answered to the patient's satisfaction.     Lillyth Spong

## 2012-08-07 NOTE — Progress Notes (Signed)
Reported CBG to Dr Ola Spurr

## 2012-08-07 NOTE — Anesthesia Preprocedure Evaluation (Addendum)
Anesthesia Evaluation  Patient identified by MRN, date of birth, ID band Patient awake    Reviewed: Allergy & Precautions, H&P , NPO status , Patient's Chart, lab work & pertinent test results  History of Anesthesia Complications (+) PONV  Airway Mallampati: II TM Distance: >3 FB Neck ROM: Full    Dental No notable dental hx. (+) Teeth Intact and Dental Advisory Given   Pulmonary neg pulmonary ROS,  breath sounds clear to auscultation  Pulmonary exam normal       Cardiovascular hypertension, On Medications + CAD and + Cardiac Stents Rhythm:Regular Rate:Normal     Neuro/Psych  Headaches, TIA Neuromuscular disease CVA, Residual Symptoms negative psych ROS   GI/Hepatic Neg liver ROS, GERD-  Medicated and Controlled,  Endo/Other  diabetes, Type 1, Insulin Dependent  Renal/GU negative Renal ROS  negative genitourinary   Musculoskeletal   Abdominal   Peds  Hematology negative hematology ROS (+)   Anesthesia Other Findings   Reproductive/Obstetrics negative OB ROS                           Anesthesia Physical Anesthesia Plan  ASA: III  Anesthesia Plan: MAC   Post-op Pain Management:    Induction: Intravenous  Airway Management Planned: Nasal Cannula  Additional Equipment:   Intra-op Plan:   Post-operative Plan: Extubation in OR  Informed Consent: I have reviewed the patients History and Physical, chart, labs and discussed the procedure including the risks, benefits and alternatives for the proposed anesthesia with the patient or authorized representative who has indicated his/her understanding and acceptance.   Dental advisory given  Plan Discussed with: CRNA  Anesthesia Plan Comments:        Anesthesia Quick Evaluation

## 2012-08-08 LAB — GLUCOSE, CAPILLARY
Glucose-Capillary: 126 mg/dL — ABNORMAL HIGH (ref 70–99)
Glucose-Capillary: 161 mg/dL — ABNORMAL HIGH (ref 70–99)

## 2012-08-08 MED ORDER — PROMETHAZINE HCL 25 MG RE SUPP: 25.0000 mg | Freq: Four times a day (QID) | RECTAL | Status: DC | PRN

## 2012-08-08 MED ORDER — CLINDAMYCIN HCL 300 MG PO CAPS: 300.0000 mg | ORAL_CAPSULE | Freq: Three times a day (TID) | ORAL | Status: DC

## 2012-08-08 MED ORDER — HYDROCODONE-ACETAMINOPHEN 7.5-500 MG PO TABS: 1.0000 | ORAL_TABLET | Freq: Four times a day (QID) | ORAL | Status: DC | PRN

## 2012-08-08 NOTE — Discharge Planning (Signed)
Patient discharged home in stable condition. Verbalizes understanding of all discharge instructions, including home medications and follow up appointments. 

## 2012-08-08 NOTE — Discharge Summary (Signed)
Physician Discharge Summary  Patient ID: [name] MRN: [id number] DOB/AGE: [birth year] 63 y.o.  Admit date: 08/07/2012 Discharge date: 08/08/2012  Admission Diagnoses: Vocal cord paraylsis  Discharge Diagnoses:  Active Problems:  * No active hospital problems. *    Discharged Condition: good  Hospital Course: no complications. Rubber band drain removed prior to discharge.  Consults: None  Significant Diagnostic Studies: none  Treatments: surgery: vocal cord medialization  Discharge Exam: Blood pressure 147/81, pulse 74, temperature 98.6 F (37 C), temperature source Oral, resp. rate 18, height 5' 11.5" (1.816 m), weight 209 lb (94.802 kg), SpO2 99.00%. PHYSICAL EXAM: Incision looks excellent. Drain removed. Voice strong. Swallowing improved.  Disposition: 01-Home or Self Care  Discharge Orders    Future Appointments: Provider: Department: Dept Phone: Center:   08/14/2012 8:40 AM [facility] [phone] [facility]   08/15/2012 9:00 AM Sable Feil, MD [facility] [phone] [facility]   01/19/2013 10:00 AM Rowe Clack, MD [facility] [phone] [facility]   07/09/2013 8:15 AM Hayden Pedro, MD [facility] [phone] None     Future Orders Please Complete By Expires   Diet - low sodium heart healthy      Increase activity slowly          Medication List     As of 08/08/2012  8:58 AM    TAKE these medications         ALLEGRA 180 MG tablet   Generic drug: fexofenadine   Take 180 mg by mouth daily as needed. For allergies      atenolol 25 MG tablet   Commonly known as: TENORMIN   Take 25 mg by mouth daily.      clindamycin 300 MG capsule   Commonly known as: CLEOCIN   Take 1 capsule (300 mg total) by mouth 3 (three) times daily.      clopidogrel 75 MG tablet   Commonly known as: PLAVIX   Take 75 mg by mouth daily.      diphenhydrAMINE 25 MG tablet   Commonly known as: BENADRYL   Take  25 mg by mouth every 6 (six) hours as needed. For allergies      fluticasone 50 MCG/ACT nasal spray   Commonly known as: FLONASE   Place 2 sprays into the nose daily.      hydrochlorothiazide 12.5 MG capsule   Commonly known as: MICROZIDE   Take 1 capsule (12.5 mg total) by mouth daily.      HYDROcodone-acetaminophen 7.5-500 MG per tablet   Commonly known as: LORTAB   Take 1 tablet by mouth every 6 (six) hours as needed for pain.      insulin aspart 100 UNIT/ML injection   Commonly known as: novoLOG   Inject 2-4 Units into the skin 3 (three) times daily before meals. 4 units in the morning and at night, and 2 units at lunch.      lisinopril 40 MG tablet   Commonly known as: PRINIVIL,ZESTRIL   Take 1 tablet (40 mg total) by mouth daily.      nitroGLYCERIN 0.4 MG SL tablet   Commonly known as: NITROSTAT   Place 0.4 mg under the tongue every 5 (five) minutes as needed. For chest pain      pantoprazole 40 MG tablet   Commonly known as: PROTONIX   Take 1 tablet (40 mg total) by mouth 2 (two) times daily.      pravastatin 40 MG tablet   Commonly known as:  PRAVACHOL   Take 40 mg by mouth at bedtime.      promethazine 25 MG suppository   Commonly known as: PHENERGAN   Place 1 suppository (25 mg total) rectally every 6 (six) hours as needed for nausea.      sucralfate 1 GM/10ML suspension   Commonly known as: CARAFATE   Take 10 mLs (1 g total) by mouth 4 (four) times daily.           Follow-up Information    Follow up with Izora Gala, MD. Schedule an appointment as soon as possible for a visit in 2 weeks.   Contact information:   [address], [place], [place] [address] [phone]          Signed: Izora Gala 08/08/2012, 8:58 AM

## 2012-08-09 ENCOUNTER — Encounter (HOSPITAL_COMMUNITY): Payer: Self-pay | Admitting: Otolaryngology

## 2012-08-14 ENCOUNTER — Inpatient Hospital Stay (HOSPITAL_COMMUNITY): Payer: Medicare Other

## 2012-08-14 ENCOUNTER — Emergency Department (HOSPITAL_COMMUNITY): Payer: Medicare Other

## 2012-08-14 ENCOUNTER — Inpatient Hospital Stay (HOSPITAL_COMMUNITY)
Admission: EM | Admit: 2012-08-14 | Discharge: 2012-08-18 | DRG: 065 | Disposition: A | Discharge status: Rehabilitation Facility | Payer: Medicare Other | Attending: Neurology | Admitting: Neurology

## 2012-08-14 ENCOUNTER — Other Ambulatory Visit (INDEPENDENT_AMBULATORY_CARE_PROVIDER_SITE_OTHER): Payer: Medicare Other

## 2012-08-14 DIAGNOSIS — I6350 Cerebral infarction due to unspecified occlusion or stenosis of unspecified cerebral artery: Principal | ICD-10-CM

## 2012-08-14 DIAGNOSIS — Z79899 Other long term (current) drug therapy: Secondary | ICD-10-CM

## 2012-08-14 DIAGNOSIS — E785 Hyperlipidemia, unspecified: Secondary | ICD-10-CM | POA: Diagnosis present

## 2012-08-14 DIAGNOSIS — I69991 Dysphagia following unspecified cerebrovascular disease: Secondary | ICD-10-CM

## 2012-08-14 DIAGNOSIS — E782 Mixed hyperlipidemia: Secondary | ICD-10-CM | POA: Diagnosis present

## 2012-08-14 DIAGNOSIS — R4789 Other speech disturbances: Secondary | ICD-10-CM | POA: Diagnosis present

## 2012-08-14 DIAGNOSIS — E1159 Type 2 diabetes mellitus with other circulatory complications: Secondary | ICD-10-CM | POA: Diagnosis present

## 2012-08-14 DIAGNOSIS — M129 Arthropathy, unspecified: Secondary | ICD-10-CM | POA: Diagnosis present

## 2012-08-14 DIAGNOSIS — G459 Transient cerebral ischemic attack, unspecified: Secondary | ICD-10-CM

## 2012-08-14 DIAGNOSIS — J309 Allergic rhinitis, unspecified: Secondary | ICD-10-CM | POA: Diagnosis present

## 2012-08-14 DIAGNOSIS — I2510 Atherosclerotic heart disease of native coronary artery without angina pectoris: Secondary | ICD-10-CM | POA: Diagnosis present

## 2012-08-14 DIAGNOSIS — E1169 Type 2 diabetes mellitus with other specified complication: Secondary | ICD-10-CM | POA: Diagnosis present

## 2012-08-14 DIAGNOSIS — K219 Gastro-esophageal reflux disease without esophagitis: Secondary | ICD-10-CM | POA: Diagnosis present

## 2012-08-14 DIAGNOSIS — Z794 Long term (current) use of insulin: Secondary | ICD-10-CM

## 2012-08-14 DIAGNOSIS — I10 Essential (primary) hypertension: Secondary | ICD-10-CM | POA: Diagnosis present

## 2012-08-14 DIAGNOSIS — I152 Hypertension secondary to endocrine disorders: Secondary | ICD-10-CM | POA: Diagnosis present

## 2012-08-14 DIAGNOSIS — Z8673 Personal history of transient ischemic attack (TIA), and cerebral infarction without residual deficits: Secondary | ICD-10-CM

## 2012-08-14 DIAGNOSIS — I639 Cerebral infarction, unspecified: Secondary | ICD-10-CM | POA: Diagnosis present

## 2012-08-14 DIAGNOSIS — E46 Unspecified protein-calorie malnutrition: Secondary | ICD-10-CM | POA: Diagnosis present

## 2012-08-14 DIAGNOSIS — Z7902 Long term (current) use of antithrombotics/antiplatelets: Secondary | ICD-10-CM

## 2012-08-14 DIAGNOSIS — M549 Dorsalgia, unspecified: Secondary | ICD-10-CM | POA: Diagnosis present

## 2012-08-14 DIAGNOSIS — Z9861 Coronary angioplasty status: Secondary | ICD-10-CM

## 2012-08-14 DIAGNOSIS — G8194 Hemiplegia, unspecified affecting left nondominant side: Secondary | ICD-10-CM | POA: Diagnosis present

## 2012-08-14 DIAGNOSIS — E876 Hypokalemia: Secondary | ICD-10-CM | POA: Diagnosis not present

## 2012-08-14 DIAGNOSIS — IMO0001 Reserved for inherently not codable concepts without codable children: Secondary | ICD-10-CM

## 2012-08-14 DIAGNOSIS — K5900 Constipation, unspecified: Secondary | ICD-10-CM | POA: Diagnosis present

## 2012-08-14 DIAGNOSIS — G629 Polyneuropathy, unspecified: Secondary | ICD-10-CM | POA: Insufficient documentation

## 2012-08-14 DIAGNOSIS — G609 Hereditary and idiopathic neuropathy, unspecified: Secondary | ICD-10-CM | POA: Diagnosis present

## 2012-08-14 DIAGNOSIS — G8929 Other chronic pain: Secondary | ICD-10-CM | POA: Diagnosis present

## 2012-08-14 DIAGNOSIS — G8190 Hemiplegia, unspecified affecting unspecified side: Secondary | ICD-10-CM | POA: Diagnosis present

## 2012-08-14 DIAGNOSIS — E119 Type 2 diabetes mellitus without complications: Secondary | ICD-10-CM | POA: Diagnosis present

## 2012-08-14 DIAGNOSIS — R1310 Dysphagia, unspecified: Secondary | ICD-10-CM

## 2012-08-14 DIAGNOSIS — I69391 Dysphagia following cerebral infarction: Secondary | ICD-10-CM

## 2012-08-14 DIAGNOSIS — I6789 Other cerebrovascular disease: Secondary | ICD-10-CM | POA: Diagnosis present

## 2012-08-14 HISTORY — DX: Transient cerebral ischemic attack, unspecified: G45.9

## 2012-08-14 LAB — COMPREHENSIVE METABOLIC PANEL
ALT: 20 U/L (ref 0–53)
AST: 19 U/L (ref 0–37)
Albumin: 3.8 g/dL (ref 3.5–5.2)
Alkaline Phosphatase: 67 U/L (ref 39–117)
BUN: 10 mg/dL (ref 6–23)
CO2: 30 mEq/L (ref 19–32)
Calcium: 9.4 mg/dL (ref 8.4–10.5)
Chloride: 100 mEq/L (ref 96–112)
Creatinine, Ser: 1.04 mg/dL (ref 0.50–1.35)
GFR calc Af Amer: 87 mL/min — ABNORMAL LOW (ref >90)
GFR calc non Af Amer: 75 mL/min — ABNORMAL LOW (ref >90)
Glucose, Bld: 335 mg/dL — ABNORMAL HIGH (ref 70–99)
Potassium: 4.1 mEq/L (ref 3.5–5.1)
Sodium: 138 mEq/L (ref 135–145)
Total Bilirubin: 0.6 mg/dL (ref 0.3–1.2)
Total Protein: 7.5 g/dL (ref 6.0–8.3)

## 2012-08-14 LAB — CBC
HCT: 36.4 % — ABNORMAL LOW (ref 39.0–52.0)
Hemoglobin: 12.7 g/dL — ABNORMAL LOW (ref 13.0–17.0)
MCH: 30.9 pg (ref 26.0–34.0)
MCHC: 34.9 g/dL (ref 30.0–36.0)
MCV: 88.6 fL (ref 78.0–100.0)
Platelets: 207 10*3/uL (ref 150–400)
RBC: 4.11 MIL/uL — ABNORMAL LOW (ref 4.22–5.81)
RDW: 12.1 % (ref 11.5–15.5)
WBC: 5.3 10*3/uL (ref 4.0–10.5)

## 2012-08-14 LAB — DIFFERENTIAL
Basophils Absolute: 0 10*3/uL (ref 0.0–0.1)
Basophils Relative: 0 % (ref 0–1)
Eosinophils Absolute: 0.1 10*3/uL (ref 0.0–0.7)
Eosinophils Relative: 2 % (ref 0–5)
Lymphocytes Relative: 36 % (ref 12–46)
Lymphs Abs: 1.9 10*3/uL (ref 0.7–4.0)
Monocytes Absolute: 0.4 10*3/uL (ref 0.1–1.0)
Monocytes Relative: 7 % (ref 3–12)
Neutro Abs: 2.9 10*3/uL (ref 1.7–7.7)
Neutrophils Relative %: 55 % (ref 43–77)

## 2012-08-14 LAB — PROTIME-INR
INR: 1.06 (ref 0.00–1.49)
Prothrombin Time: 13.7 seconds (ref 11.6–15.2)

## 2012-08-14 LAB — RAPID URINE DRUG SCREEN, HOSP PERFORMED
Amphetamines: NOT DETECTED
Barbiturates: NOT DETECTED
Benzodiazepines: NOT DETECTED
Cocaine: NOT DETECTED
Opiates: POSITIVE — AB
Tetrahydrocannabinol: NOT DETECTED

## 2012-08-14 LAB — GLUCOSE, CAPILLARY
Glucose-Capillary: 321 mg/dL — ABNORMAL HIGH (ref 70–99)
Glucose-Capillary: 163 mg/dL — ABNORMAL HIGH (ref 70–99)

## 2012-08-14 LAB — APTT: aPTT: 34 seconds (ref 24–37)

## 2012-08-14 LAB — TROPONIN I: Troponin I: <0.3 ng/mL (ref <0.30)

## 2012-08-14 MED ORDER — INSULIN GLARGINE 100 UNIT/ML ~~LOC~~ SOLN: 10.0000 [IU] | Freq: Every day | SUBCUTANEOUS | Status: DC

## 2012-08-14 MED ORDER — INSULIN ASPART 100 UNIT/ML ~~LOC~~ SOLN
0.0000 [IU] | SUBCUTANEOUS | Status: DC
  Administered 2012-08-14: 3 [IU] via SUBCUTANEOUS
  Administered 2012-08-15 (×2): 2 [IU] via SUBCUTANEOUS
  Administered 2012-08-15: 3 [IU] via SUBCUTANEOUS
  Administered 2012-08-15: 2 [IU] via SUBCUTANEOUS
  Administered 2012-08-16 (×2): 3 [IU] via SUBCUTANEOUS
  Administered 2012-08-16: 2 [IU] via SUBCUTANEOUS
  Administered 2012-08-16: 3 [IU] via SUBCUTANEOUS

## 2012-08-14 MED ORDER — SENNOSIDES-DOCUSATE SODIUM 8.6-50 MG PO TABS
1.0000 | ORAL_TABLET | Freq: Every evening | ORAL | Status: DC | PRN
  Filled 2012-08-14: qty 1

## 2012-08-14 MED ORDER — SODIUM CHLORIDE 0.9 % IV SOLN
INTRAVENOUS | Status: DC
  Administered 2012-08-15 – 2012-08-16 (×2): via INTRAVENOUS

## 2012-08-14 MED ORDER — INSULIN ASPART 100 UNIT/ML ~~LOC~~ SOLN: 0.0000 [IU] | SUBCUTANEOUS | Status: DC

## 2012-08-14 MED ORDER — ONDANSETRON HCL 4 MG/2ML IJ SOLN
4.0000 mg | Freq: Four times a day (QID) | INTRAMUSCULAR | Status: DC | PRN
  Administered 2012-08-15 – 2012-08-16 (×2): 4 mg via INTRAVENOUS
  Filled 2012-08-14 (×2): qty 2

## 2012-08-14 MED ORDER — ONDANSETRON HCL 4 MG/2ML IJ SOLN
4.0000 mg | Freq: Once | INTRAMUSCULAR | Status: AC
  Administered 2012-08-14: 4 mg via INTRAVENOUS

## 2012-08-14 MED ORDER — KETOROLAC TROMETHAMINE 15 MG/ML IJ SOLN
15.0000 mg | Freq: Once | INTRAMUSCULAR | Status: AC
  Administered 2012-08-14: 15 mg via INTRAVENOUS
  Filled 2012-08-14: qty 1

## 2012-08-14 MED ORDER — IOHEXOL 350 MG/ML SOLN
50.0000 mL | Freq: Once | INTRAVENOUS | Status: AC | PRN
  Administered 2012-08-14: 50 mL via INTRAVENOUS

## 2012-08-14 MED ORDER — LABETALOL HCL 5 MG/ML IV SOLN: 20.0000 mg | INTRAVENOUS | Status: DC | PRN

## 2012-08-14 MED ORDER — ONDANSETRON HCL 4 MG/2ML IJ SOLN
INTRAMUSCULAR | Status: AC
  Administered 2012-08-14: 4 mg via INTRAVENOUS
  Filled 2012-08-14: qty 2

## 2012-08-14 MED ORDER — HEPARIN (PORCINE) IN NACL 100-0.45 UNIT/ML-% IJ SOLN
1250.0000 [IU]/h | INTRAMUSCULAR | Status: DC
  Administered 2012-08-14: 1250 [IU]/h via INTRAVENOUS
  Filled 2012-08-14 (×2): qty 250

## 2012-08-14 NOTE — ED Notes (Signed)
Pt will received modified swallow screen tomorrow

## 2012-08-14 NOTE — ED Notes (Signed)
Pt arrived back to room 14 in ED with this RN and Stroke Team.

## 2012-08-14 NOTE — ED Notes (Signed)
Pt onset of weakness to L sided noted to have started 1000.

## 2012-08-14 NOTE — ED Provider Notes (Addendum)
History     CSN: [id number]  Arrival date & time 08/14/12  1207   First MD Initiated Contact with Patient 08/14/12 1214      Chief Complaint  Patient presents with  . Weakness    Patient is a 61 y.o. male presenting with weakness. The history is provided by the patient and a relative.  Weakness The primary symptoms include focal weakness and loss of sensation. Primary symptoms do not include headaches, syncope, altered mental status, memory loss or vomiting. Primary symptoms comment: left left weakness and left facial numbness The symptoms began 1 to 2 hours ago. The symptoms are unchanged. The neurological symptoms are focal.  Additional symptoms include weakness. Additional symptoms do not include vertigo.  pt reports onset of left LE weakness and left facial numbness about two hrs PTA He denies CP/SOB No falls No new back or abdominal pain He reports he had difficulty driving due to the weakness He reports he just had laryngeal surgery recently Past Medical History  Diagnosis Date  . ALLERGIC RHINITIS   . CAD, NATIVE VESSEL     BMS to OM1 2001, DES to BMS 2005  . DIAB W/UNSPEC COMP TYPE II/UNSPEC TYPE UNCNTRL   . ERECTILE DYSFUNCTION   . HYPERLIPIDEMIA-MIXED     takes Lipitor daily  . MORTON'S NEUROMA, RIGHT   . SHOULDER PAIN, RIGHT   . PONV (postoperative nausea and vomiting)   . HYPERTENSION, BENIGN     takes Lisinopril and Atenolol daily  . Coughing   . Shortness of breath     lying/sitting/exertion  . Headache     occasionally  . Peripheral neuropathy   . Arthritis   . Joint pain   . Joint swelling   . Chronic back pain     herniated disc  . GERD     takes Protonix daily  . Constipation     takes Carafate four times day  . Seasonal allergies     takes Allegra and Benadryl daily prn;uses Flonase daily  . Diabetes mellitus     on insulin  . Insomnia     doesn't require meds  . Stroke 02/2012    takes Plavix daily;on hold for surgery;rt sided weakness     Past Surgical History  Procedure Date  . Stent 2001, 2004    coronary stents  . Angioplasty   . Left knee surgury     x 2   . Dental surgery   . Coronary angioplasty 2005    2 stents  . Colonoscopy   . Laryngoplasty 08/07/2012    Procedure: LARYNGOPLASTY;  Surgeon: Izora Gala, MD;  Location: [facility] OR;  Service: ENT;  Laterality: Left;  Left Vocal Cord Medialyzation    Family History  Problem Relation Age of Onset  . Stroke      History  Substance Use Topics  . Smoking status: Former Smoker    Quit date: 02/24/1983  . Smokeless tobacco: Never Used   Comment: Married, livesin [place] with wife. He is disable secondary to back pain. 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 Alcohol Use: No      Review of Systems  Cardiovascular: Negative for syncope.  Gastrointestinal: Negative for vomiting.  Neurological: Positive for focal weakness and weakness. Negative for vertigo and headaches.  Psychiatric/Behavioral: Negative for memory loss and altered mental status.  All other systems reviewed and are negative.    Allergies  Penicillins; Pneumococcal vaccines; and Shellfish allergy  Home Medications   Current  Outpatient Rx  Name Route Sig Dispense Refill  . ATENOLOL 25 MG PO TABS Oral Take 25 mg by mouth daily.    Marland Kitchen CLINDAMYCIN HCL 300 MG PO CAPS Oral Take 1 capsule (300 mg total) by mouth 3 (three) times daily. 15 capsule 0  . CLOPIDOGREL BISULFATE 75 MG PO TABS Oral Take 75 mg by mouth daily.    Marland Kitchen DIPHENHYDRAMINE HCL 25 MG PO TABS Oral Take 25 mg by mouth every 6 (six) hours as needed. For allergies    . FEXOFENADINE HCL 180 MG PO TABS Oral Take 180 mg by mouth daily as needed. For allergies    . FLUTICASONE PROPIONATE 50 MCG/ACT NA SUSP Nasal Place 2 sprays into the nose daily. 16 g 2  . HYDROCHLOROTHIAZIDE 12.5 MG PO CAPS Oral Take 1 capsule (12.5 mg total) by mouth daily. 30 capsule 2  . HYDROCODONE-ACETAMINOPHEN 7.5-500 MG PO TABS Oral Take 1 tablet by mouth every 6  (six) hours as needed for pain. 30 tablet 0  . INSULIN ASPART 100 UNIT/ML Fowler SOLN Subcutaneous Inject 2-4 Units into the skin 3 (three) times daily before meals. 4 units in the morning and at night, and 2 units at lunch.    Marland Kitchen LISINOPRIL 40 MG PO TABS Oral Take 1 tablet (40 mg total) by mouth daily. 30 tablet 4  . NITROGLYCERIN 0.4 MG SL SUBL Sublingual Place 0.4 mg under the tongue every 5 (five) minutes as needed. For chest pain    . PANTOPRAZOLE SODIUM 40 MG PO TBEC Oral Take 1 tablet (40 mg total) by mouth 2 (two) times daily. 60 tablet 3  . PRAVASTATIN SODIUM 40 MG PO TABS Oral Take 40 mg by mouth at bedtime.     Marland Kitchen PROMETHAZINE HCL 25 MG RE SUPP Rectal Place 1 suppository (25 mg total) rectally every 6 (six) hours as needed for nausea. 12 each 0  . SUCRALFATE 1 GM/10ML PO SUSP Oral Take 10 mLs (1 g total) by mouth 4 (four) times daily. 420 mL 0    BP 157/91  Pulse 82  Temp 99 F (37.2 C) (Oral)  Resp 16  SpO2 100%  Physical Exam CONSTITUTIONAL: Well developed/well nourished HEAD AND FACE: Normocephalic/atraumatic EYES: EOMI/PERRL ENMT: Mucous membranes moist NECK: supple no meningeal signs, well healed incision to anterior neck, no bleeding/drainage/erythema SPINE:entire spine nontender CV: S1/S2 noted, no murmurs/rubs/gallops noted LUNGS: Lungs are clear to auscultation bilaterally, no apparent distress ABDOMEN: soft, nontender, no rebound or guarding GU:no cva tenderness NEURO: Pt is awake/alert.  Left facial numbness reported.  No significant facial droop.  Left LE drift is noted EXTREMITIES: pulses normal, full ROM SKIN: warm, color normal PSYCH: no abnormalities of mood noted  ED Course  Procedures  Labs Reviewed  GLUCOSE, CAPILLARY - Abnormal; Notable for the following:    Glucose-Capillary 321 (*)     All other components within normal limits  PROTIME-INR  APTT  CBC  DIFFERENTIAL  COMPREHENSIVE METABOLIC PANEL  URINE RAPID DRUG SCREEN (HOSP PERFORMED)    TROPONIN I  12:25 PM Pt seen on arrival, has had left LE weakness and left facial numbness since 10am Code stroke called 1:08 PM Ct head negative Awaiting neuro evaluation 3:14 PM D/w dr Nicole Kindred, he cancelled code stroke, he is not a TPA candidate due to recent surgery per dr Nicole Kindred He recommends CT angio head/neck 4:20 PM D/w [facility]ad to admit to neuro tele Pt stable.  BP improved I doubt other causes of weakness as he has no  new back or abdominal pain   MDM  Nursing notes including past medical history and social history reviewed and considered in documentation Labs/vital reviewed and considered        Date: 08/14/2012  Rate: 82  Rhythm: normal sinus rhythm  QRS Axis: normal  Intervals: normal  ST/T Wave abnormalities: ST depressions inferiorly  Conduction Disutrbances:none  Narrative Interpretation:   Old EKG Reviewed: unchanged    Sharyon Cable, MD 08/14/12 [place], MD 08/14/12 1625

## 2012-08-14 NOTE — ED Notes (Signed)
Per PT[facility], pt from home c/o weakness to left side since about an hour ago with tingling to left foot and from the knee down. Pt is s/p surgery on larynx from 9/16 and is c/o pain to surgical site. Pt is A/O x 4 with hx of prior stroke in April this year with deficits to left side.

## 2012-08-14 NOTE — Consult Note (Signed)
Name: [name] MRN: [id number] DOB: March 06, 1951  LOS: 0  CRITICAL CARE CONSULT NOTE  History of Present Illness: [name] is a pleasant male with CAD, DM2 who presented to the [facility] ED on 9/23 with acute onset left sided weakness and facial droop.  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  His plavix had been held for that procedure.  On 9/23 he began to feel unusual (can't describe details) and his family noted in the afternoon that he had the abrupt onset of left sided weakness.  He was brought to the [facility] ED but was unable to receive tPA due to the recent surgery.  Neurology admitted the patient and we were called for a medical consult while he is in the ICU.  Prior to this he was feeling well without fever, chills, nausea, vomiting, or pain of any kind.    Lines / Drains: peripheral  Cultures / Sepsis markers: none  Antibiotics: none  Tests / Events: 9/23 CT head >> NAICP 9/23 CT angio brain/neck >> no vascular abnormalities noted 9/23 CT head >> NAICP     Past Medical History  Diagnosis Date  . ALLERGIC RHINITIS   . CAD, NATIVE VESSEL     BMS to OM1 2001, DES to BMS 2005  . DIAB W/UNSPEC COMP TYPE II/UNSPEC TYPE UNCNTRL   . ERECTILE DYSFUNCTION   . HYPERLIPIDEMIA-MIXED     takes Lipitor daily  . MORTON'S NEUROMA, RIGHT   . SHOULDER PAIN, RIGHT   . PONV (postoperative nausea and vomiting)   . HYPERTENSION, BENIGN     takes Lisinopril and Atenolol daily  . Coughing   . Shortness of breath     lying/sitting/exertion  . Headache     occasionally  . Peripheral neuropathy   . Arthritis   . Joint pain   . Joint swelling   . Chronic back pain     herniated disc  . GERD     takes Protonix daily  . Constipation     takes Carafate four times day  . Seasonal allergies     takes Allegra and Benadryl daily prn;uses Flonase daily  . Diabetes mellitus     on insulin  . Insomnia     doesn't require meds  . Stroke 02/2012    takes  Plavix daily;on hold for surgery;rt sided weakness   Past Surgical History  Procedure Date  . Stent 2001, 2004    coronary stents  . Angioplasty   . Left knee surgury     x 2   . Dental surgery   . Coronary angioplasty 2005    2 stents  . Colonoscopy   . Laryngoplasty 08/07/2012    Procedure: LARYNGOPLASTY;  Surgeon: Izora Gala, MD;  Location: [facility];  Service: ENT;  Laterality: Left;  Left Vocal Cord Medialyzation   Prior to Admission medications   Medication Sig Start Date End Date Taking? Authorizing Provider  atenolol (TENORMIN) 25 MG tablet Take 25 mg by mouth daily.   Yes Historical Provider, MD  clopidogrel (PLAVIX) 75 MG tablet Take 75 mg by mouth daily.   Yes Historical Provider, MD  diphenhydrAMINE (BENADRYL) 25 MG tablet Take 25 mg by mouth every 6 (six) hours as needed. For allergies   Yes Historical Provider, MD  fexofenadine (ALLEGRA) 180 MG tablet Take 180 mg by mouth daily as needed. For allergies   Yes Historical Provider, MD  fluticasone (FLONASE) 50 MCG/ACT nasal spray Place 2 sprays into  the nose daily. 07/21/12 07/21/13 Yes Rowe Clack, MD  hydrochlorothiazide (MICROZIDE) 12.5 MG capsule Take 1 capsule (12.5 mg total) by mouth daily. 05/11/12 05/11/13 Yes Rowe Clack, MD  HYDROcodone-acetaminophen (LORTAB 7.5) 7.5-500 MG per tablet Take 1 tablet by mouth every 6 (six) hours as needed for pain. 08/08/12  Yes Izora Gala, MD  insulin aspart (NOVOLOG) 100 UNIT/ML injection Inject 2-4 Units into the skin 3 (three) times daily before meals. 4 units in the morning and at night, and 2 units at lunch. 04/10/12 04/10/13 Yes Modena Jansky, MD  lisinopril (PRINIVIL,ZESTRIL) 40 MG tablet Take 1 tablet (40 mg total) by mouth daily. 06/30/12 06/30/13 Yes Rowe Clack, MD  nitroGLYCERIN (NITROSTAT) 0.4 MG SL tablet Place 0.4 mg under the tongue every 5 (five) minutes as needed. For chest pain   Yes Historical Provider, MD  pantoprazole (PROTONIX) 40 MG tablet Take 1  tablet (40 mg total) by mouth 2 (two) times daily. 07/21/12 07/21/13 Yes Rowe Clack, MD  pravastatin (PRAVACHOL) 40 MG tablet Take 40 mg by mouth at bedtime.  07/12/12  Yes Historical Provider, MD  promethazine (PHENERGAN) 25 MG suppository Place 1 suppository (25 mg total) rectally every 6 (six) hours as needed for nausea. 08/08/12  Yes Izora Gala, MD  sucralfate (CARAFATE) 1 GM/10ML suspension Take 10 mLs (1 g total) by mouth 4 (four) times daily. 07/21/12 08/20/12 Yes Rowe Clack, MD   Allergies  Allergen Reactions  . Penicillins Anaphylaxis  . Pneumococcal Vaccines Anaphylaxis    Pt states allergic to it.  . Shellfish Allergy Anaphylaxis   Family History  Problem Relation Age of Onset  . Stroke     Social History  reports that he quit smoking about 29 years ago. He has never used smokeless tobacco. He reports that he does not drink alcohol or use illicit drugs.  Review Of Systems   10 point review of systems obtain and negative as per HPI  Vital Signs:   Filed Vitals:   08/14/12 2115 08/14/12 2130 08/14/12 2145 08/14/12 2200  BP: 156/87 167/95 165/86 161/85  Pulse: 78 82 81 81  Temp:      TempSrc:      Resp: 12 12 18 20   SpO2: 99% 99% 99% 98%    Physical Examination: Gen:  no acute distress HEENT: NCAT, PERRL, EOMi, OP clear, left facial droop Neck: supple without masses PULM: scattered rhonchi bilaterally CV: RRR, no mgr, no JVD AB: BS+, soft, nontender, no hsm Ext: warm, no edema, no clubbing, no cyanosis Derm: no rash or skin breakdown Neuro: awake and alert, answering questions, left facial droop, R side strength normal, left grip strength 3/5, shoulder and hip strength 2/5 Psyche: distressed  Labs and Imaging:   CBC    Component Value Date/Time   WBC 5.3 08/14/2012 1224   RBC 4.11* 08/14/2012 1224   HGB 12.7* 08/14/2012 1224   HCT 36.4* 08/14/2012 1224   PLT 207 08/14/2012 1224   MCV 88.6 08/14/2012 1224   MCH 30.9 08/14/2012 1224   MCHC 34.9  08/14/2012 1224   RDW 12.1 08/14/2012 1224   LYMPHSABS 1.9 08/14/2012 1224   MONOABS 0.4 08/14/2012 1224   EOSABS 0.1 08/14/2012 1224   BASOSABS 0.0 08/14/2012 1224    BMET    Component Value Date/Time   NA 138 08/14/2012 1224   K 4.1 08/14/2012 1224   CL 100 08/14/2012 1224   CO2 30 08/14/2012 1224   GLUCOSE 335* 08/14/2012 1224  BUN 10 08/14/2012 1224   CREATININE 1.04 08/14/2012 1224   CALCIUM 9.4 08/14/2012 1224   GFRNONAA 75* 08/14/2012 1224   GFRAA 87* 08/14/2012 1224    ABG No results found for this basename: phart, pco2, pco2art, po2, po2art, hco3, tco2, acidbasedef, o2sat    Assessment and Plan:  61 y/o male with known CAD, DM and hypertension who presented with an acute onset ischaemic stroke with left hemiparalysis on 9/23.  PCCM consulted for medical management in ICU.  Currently protecting airway, vitals WNL, no infectious symptoms.  CVA (cerebral infarction) (08/14/2012)   Assessment: acute ischaemic   Plan:  -heparin gtt and asa per neurology -bp goals per neurology  DIABETES MELLITUS, TYPE II, UNCONTROLLED (04/13/2010)   Assessment:    Plan:  -check A1c as secondary stroke prevention -SSI for goal glucose < 180  HYPERLIPIDEMIA-MIXED (03/07/2009)   Assessment:    Plan:  -continue statin  HYPERTENSION, BENIGN (03/12/2009)   Assessment:    Plan:  -labetalol prn SBP > 200  CAD, NATIVE VESSEL (03/12/2009)   Assessment:    Plan:  -restart plavix when OK by ENT -ASA given   Best practices / Disposition:  Feeding/protein malnutrition: npo until swallow screen Analgesia: n/a Sedation: n/a Thromboprophylaxis: hep gtt HOB >30 degrees Ulcer prophylaxis: n/a Glucose control/hyperglycemia: ssi   Roselie Awkward, M.D. Pulmonary and [place] Pager: [phone]  08/14/2012, 11:34 PM

## 2012-08-14 NOTE — Consult Note (Signed)
[facility] Medical Consultation  [name] [id number] DOB: Apr 28, 1951 DOA: 08/14/2012 PCP: [name], MD   Requesting physician: Ripley Fraise, MD    Date of consultation: 08/14/2012 Reason for consultation: Stroke like symptoms, for admission for evaluation and admission.  Impression/Recommendations Principal Problem:  *CVA (cerebral infarction) Active Problems:  DIABETES MELLITUS, TYPE II, UNCONTROLLED  HYPERLIPIDEMIA-MIXED  MORTON'S NEUROMA, RIGHT  HYPERTENSION, BENIGN  CAD, NATIVE VESSEL  GERD  SHOULDER PAIN, RIGHT  Constipation  Chronic back pain  Dysphagia following cerebrovascular accident  Left hemiplegia    1. CVA with left hemiplegia-waxing and waning presentation: Patient had been evaluated by neurology earlier. However since then patient has obviously declined with worsening slurred speech, facial asymmetry, patient is unable to manage his secretions, dense left-sided weakness and numbness. Neurology has reevaluated patient. Per neurology, repeat head CT was negative for bleed. IV heparin will be started. Neurology is admitting patient to the neuro intensive care unit and critical care medicine will consult for evaluation and management of medical issues. 2. Type 2 diabetes mellitus: Will need Lantus and NovoLog sliding scale insulin every 4 hourly and close monitoring. 3. Uncontrolled hypertension: Allow permissive hypertension secondary to acute stroke. 4. Dysphagia: Patient failed swallow eval and is planned for FEES by ST. N.p.o. 5. CAD: Asymptomatic of chest pain. 6. GERD: IV PPIs.  The [facility] service will not see patient in the intensive care unit. His medical issues will be managed by the critical care service-discussed with PCCM. Please reconsult the hospitalist service when patient is transferred out of the intensive care unit.  Chief Complaint: Left-sided numbness and weakness.  HPI:  61 year old African American male  patient with history of hypertension, hyperlipidemia, CAD, IDDM, CVA with left-sided weakness in May 2013, recent surgical procedure on his vocal cords presents to the emergency department with subacute onset of left-sided numbness and weakness. Patient's Plavix had been held between 9/11-9/16 for the surgery. He initiated Plavix again 9/16. He was in his usual state of health with no residual weakness from prior CVA. At approximately 9 AM while he was sitting at [employer] having breakfast after returning from lab tests, he started experiencing numbness in his left leg. He drove himself back home. On route in his numbness seemed to get worse. When he returned home and got out of his car he had difficulty ambulating and had to use a cane. His family noticed that and called EMS and patient was brought to the emergency department. Neurologist evaluated him in the ED and at that time his facial weakness as well as left upper extremity weakness were improving and CT head did not show anything acute. The hospitalist service was called to admit patient for further evaluation and management. When this M.D. went to patient's bedside at approximately 5:45 PM, per family and patient his symptoms seem to have gotten worse. There was worsening facial twisting, slurred speech, weakness and numbness of his left extremities and patient was suctioning his oral secretions. He denied any history of headache, chest pain, palpitations, loss of consciousness or passing out.  Review of Systems:  All systems reviewed and apart from history of presenting illness, are negative.  Past Medical History  Diagnosis Date  . ALLERGIC RHINITIS   . CAD, NATIVE VESSEL     BMS to OM1 2001, DES to BMS 2005  . DIAB W/UNSPEC COMP TYPE II/UNSPEC TYPE UNCNTRL   . ERECTILE DYSFUNCTION   . HYPERLIPIDEMIA-MIXED     takes Lipitor daily  . MORTON'S NEUROMA,  RIGHT   . SHOULDER PAIN, RIGHT   . PONV (postoperative nausea and vomiting)   .  HYPERTENSION, BENIGN     takes Lisinopril and Atenolol daily  . Coughing   . Shortness of breath     lying/sitting/exertion  . Headache     occasionally  . Peripheral neuropathy   . Arthritis   . Joint pain   . Joint swelling   . Chronic back pain     herniated disc  . GERD     takes Protonix daily  . Constipation     takes Carafate four times day  . Seasonal allergies     takes Allegra and Benadryl daily prn;uses Flonase daily  . Diabetes mellitus     on insulin  . Insomnia     doesn't require meds  . Stroke 02/2012    takes Plavix daily;on hold for surgery;rt sided weakness   Past Surgical History  Procedure Date  . Stent 2001, 2004    coronary stents  . Angioplasty   . Left knee surgury     x 2   . Dental surgery   . Coronary angioplasty 2005    2 stents  . Colonoscopy   . Laryngoplasty 08/07/2012    Procedure: LARYNGOPLASTY;  Surgeon: Izora Gala, MD;  Location: [place];  Service: ENT;  Laterality: Left;  Left Vocal Cord Medialyzation   Social History:  reports that he quit smoking about 29 years ago. He has never used smokeless tobacco. He reports that he does not drink alcohol or use illicit drugs.  Allergies  Allergen Reactions  . Penicillins Anaphylaxis  . Pneumococcal Vaccines Anaphylaxis    Pt states allergic to it.  . Shellfish Allergy Anaphylaxis   Family History  Problem Relation Age of Onset  . Stroke      Prior to Admission medications   Medication Sig Start Date End Date Taking? Authorizing Provider  atenolol (TENORMIN) 25 MG tablet Take 25 mg by mouth daily.   Yes Historical Provider, MD  clopidogrel (PLAVIX) 75 MG tablet Take 75 mg by mouth daily.   Yes Historical Provider, MD  diphenhydrAMINE (BENADRYL) 25 MG tablet Take 25 mg by mouth every 6 (six) hours as needed. For allergies   Yes Historical Provider, MD  fexofenadine (ALLEGRA) 180 MG tablet Take 180 mg by mouth daily as needed. For allergies   Yes Historical Provider, MD  fluticasone  (FLONASE) 50 MCG/ACT nasal spray Place 2 sprays into the nose daily. 07/21/12 07/21/13 Yes Rowe Clack, MD  hydrochlorothiazide (MICROZIDE) 12.5 MG capsule Take 1 capsule (12.5 mg total) by mouth daily. 05/11/12 05/11/13 Yes Rowe Clack, MD  HYDROcodone-acetaminophen (LORTAB 7.5) 7.5-500 MG per tablet Take 1 tablet by mouth every 6 (six) hours as needed for pain. 08/08/12  Yes Izora Gala, MD  insulin aspart (NOVOLOG) 100 UNIT/ML injection Inject 2-4 Units into the skin 3 (three) times daily before meals. 4 units in the morning and at night, and 2 units at lunch. 04/10/12 04/10/13 Yes Modena Jansky, MD  lisinopril (PRINIVIL,ZESTRIL) 40 MG tablet Take 1 tablet (40 mg total) by mouth daily. 06/30/12 06/30/13 Yes Rowe Clack, MD  nitroGLYCERIN (NITROSTAT) 0.4 MG SL tablet Place 0.4 mg under the tongue every 5 (five) minutes as needed. For chest pain   Yes Historical Provider, MD  pantoprazole (PROTONIX) 40 MG tablet Take 1 tablet (40 mg total) by mouth 2 (two) times daily. 07/21/12 07/21/13 Yes Rowe Clack, MD  pravastatin (PRAVACHOL)  40 MG tablet Take 40 mg by mouth at bedtime.  07/12/12  Yes Historical Provider, MD  promethazine (PHENERGAN) 25 MG suppository Place 1 suppository (25 mg total) rectally every 6 (six) hours as needed for nausea. 08/08/12  Yes Izora Gala, MD  sucralfate (CARAFATE) 1 GM/10ML suspension Take 10 mLs (1 g total) by mouth 4 (four) times daily. 07/21/12 08/20/12 Yes Rowe Clack, MD   Physical Exam: Blood pressure 145/88, pulse 81, temperature 99 F (37.2 C), temperature source Oral, resp. rate 15, SpO2 100.00%. Filed Vitals:   08/14/12 1345 08/14/12 1415 08/14/12 1423 08/14/12 1500  BP: 163/96 161/81 161/81 145/88  Pulse: 83 78 71 81  Temp:      TempSrc:      Resp: 23 15 10 15   SpO2: 99% 99% 100% 100%     General exam: Moderately built and nourished [place] American male lying supine on the gurney, in looking but in no obvious distress.  Head,  eyes and ENT: Nontraumatic and normocephalic. Pupils equally reacting to light and accommodation. Oral mucosa is moist.  Neck: Supple. No JVD, carotid bruits or thyromegaly. Surgical site across the anterior neck is clean dry and intact with intact sutures.  Lymphatics: No lymphadenopathy.  Respiratory system: Clear to auscultation. No increased work of breathing.  Cardiovascular system: S1 and S2 heard, regular rate and rhythm. No JVD, murmurs or pedal edema.  Gastrointestinal system: Abdomen is nondistended, soft and nontender. Normal bowel sounds heard. No organomegaly or masses appreciated.  Central nervous system: Slightly drowsy but easily arousable. Oriented x4. Left lower motor neuron facial weakness. Dysarthria. Uvula and tongue in midline. No other cranial nerve deficits.  Extremities: Left lower extremity grade 0 x 5 power. Left upper extremity grade 2 x 5 power. Diminished left sided reflexes and equivocal plantar on the left. Right extremities grade 5 x 5 power. Patchy left hemisensory deficit.  Skin: Approximately 0.5 cm superficial clean ulcer on the left lower anterior chest but otherwise no acute findings.  Musculoskeletal system: Negative  Psychiatry: Pleasant and appropriate affect.   Labs on Admission:  Basic Metabolic Panel:  Lab [id number] 1224  NA 138  K 4.1  CL 100  CO2 30  GLUCOSE 335*  BUN 10  CREATININE 1.04  CALCIUM 9.4  MG --  PHOS --   Liver Function Tests:  Lab 08/14/12 1224  AST 19  ALT 20  ALKPHOS 67  BILITOT 0.6  PROT 7.5  ALBUMIN 3.8   No results found for this basename: LIPASE:5,AMYLASE:5 in the last 168 hours No results found for this basename: AMMONIA:5 in the last 168 hours CBC:  Lab 08/14/12 1224  WBC 5.3  NEUTROABS 2.9  HGB 12.7*  HCT 36.4*  MCV 88.6  PLT 207   Cardiac Enzymes:  Lab 08/14/12 1225  CKTOTAL --  CKMB --  CKMBINDEX --  TROPONINI <0.30   BNP: No components found with this basename:  POCBNP:5 CBG:  Lab 08/14/12 1220 08/08/12 0754 08/08/12 0415 08/07/12 2309 08/07/12 2037  GLUCAP 321* 161* 126* 170* 213*    Radiological Exams on Admission: Ct Angio Head W/cm &/or Wo Cm  08/14/2012  *RADIOLOGY REPORT*  Clinical Data:  Left-sided weakness, now resolving.  CT ANGIOGRAPHY HEAD AND NECK  Technique:  Multidetector CT imaging of the head and neck was performed using the standard protocol during bolus administration of intravenous contrast.  Multiplanar CT image reconstructions including MIPs were obtained to evaluate the vascular anatomy. Carotid stenosis measurements (when applicable) are obtained  utilizing NASCET criteria, using the distal internal carotid diameter as the denominator.  Contrast: 2mL OMNIPAQUE IOHEXOL 350 MG/ML SOLN  Comparison:  MRI brain, MRI extracranial and intracranial 04/07/2012.  CT head earlier today.  CTA NECK  Findings:  Conventional branching of the great vessels from the arch without proximal stenosis.  Widely patent carotid bifurcations.  Right vertebral dominant.  No loss of stenosis.  No neck masses.  Mild spondylosis.   Review of the MIP images confirms the above findings.  IMPRESSION: No extracranial vascular abnormalities detected.  No flow limiting stenosis or dissection.  No change from prior normal spine.  CTA HEAD  Findings:  There is no evidence for acute infarction, intracranial hemorrhage, mass lesion, hydrocephalus, or extra-axial fluid. Slight atrophy with chronic microvascular ischemic changes stable. Post infusion, no abnormal intracranial enhancement.  Chronic right maxillary sinus disease.  The internal carotid arteries are widely patent.  Both anterior cerebral arteries fill from the left due to a hypoplastic A1-ACA on the right.  No MCA stenosis or occlusion.  The basilar arteries widely patent with the right vertebral dominant.  No proximal PCA stenosis and no cerebellar branch occlusion.  Moderate dolichoectasia throughout without visible  aneurysm.  Calvarium intact.  No mastoid disease.   Review of the MIP images confirms the above findings.  IMPRESSION: Unremarkable CT angiography of the intracranial circulation.  No change from prior normal study.   Original Report Authenticated By: Staci Righter, M.D.    Ct Head Wo Contrast  08/14/2012  *RADIOLOGY REPORT*  Clinical Data: Code stroke.  Left-sided weakness.  CT HEAD WITHOUT CONTRAST  Technique:  Contiguous axial images were obtained from the base of the skull through the vertex without contrast.  Comparison: 04/07/2012  Findings: There is atrophy and chronic small vessel disease changes. No acute intracranial abnormality.  Specifically, no hemorrhage, hydrocephalus, mass lesion, acute infarction, or significant intracranial injury.  No acute calvarial abnormality. Visualized paranasal sinuses and mastoids clear.  Orbital soft tissues unremarkable.  IMPRESSION: No acute intracranial abnormality.  Atrophy, chronic microvascular disease.  Critical Value/emergent results were called by telephone at the time of interpretation on 08/14/2012 at 12:46 p.m. to Dr. Nicole Kindred, who verbally acknowledged these results.   Original Report Authenticated By: Raelyn Number, M.D.     EKG: Independently reviewed. Sinus rhythm,? LAD, T. inversion in leads V5-V6 and ST depression T. inversion in inferior leads. EKG does not seem to have changed significantly since May 2013.  Time spent: 60 minutes.  [facility] Pager [phone].  If 7PM-7AM, please contact night-coverage [email or web address] Password [facility] 08/14/2012, 6:42 PM

## 2012-08-14 NOTE — ED Notes (Signed)
Dr. Nicole Kindred at bedside updating pt on plan of care. Pt informed that he would be in ICU.

## 2012-08-14 NOTE — ED Notes (Signed)
Per speech therapy, patient did not do well with swallow evaluation. Patient is to remain NPO and a SLP fees study has been ordered.

## 2012-08-14 NOTE — ED Notes (Signed)
Heparin infusion verified by Almyra Free, RN.

## 2012-08-14 NOTE — ED Notes (Signed)
Speech therapy at bedside, re: swallowing evaluation

## 2012-08-14 NOTE — Progress Notes (Signed)
Clinical/Bedside Swallow Evaluation Patient Details  Name: [name] MRN: [id number] Date of Birth: August 17, 1951  Today's Date: 08/14/2012 Time:  -     Past Medical History:  Past Medical History  Diagnosis Date  . ALLERGIC RHINITIS   . CAD, NATIVE VESSEL     BMS to OM1 2001, DES to BMS 2005  . DIAB W/UNSPEC COMP TYPE II/UNSPEC TYPE UNCNTRL   . ERECTILE DYSFUNCTION   . HYPERLIPIDEMIA-MIXED     takes Lipitor daily  . MORTON'S NEUROMA, RIGHT   . SHOULDER PAIN, RIGHT   . PONV (postoperative nausea and vomiting)   . HYPERTENSION, BENIGN     takes Lisinopril and Atenolol daily  . Coughing   . Shortness of breath     lying/sitting/exertion  . Headache     occasionally  . Peripheral neuropathy   . Arthritis   . Joint pain   . Joint swelling   . Chronic back pain     herniated disc  . GERD     takes Protonix daily  . Constipation     takes Carafate four times day  . Seasonal allergies     takes Allegra and Benadryl daily prn;uses Flonase daily  . Diabetes mellitus     on insulin  . Insomnia     doesn't require meds  . Stroke 02/2012    takes Plavix daily;on hold for surgery;rt sided weakness   Past Surgical History:  Past Surgical History  Procedure Date  . Stent 2001, 2004    coronary stents  . Angioplasty   . Left knee surgury     x 2   . Dental surgery   . Coronary angioplasty 2005    2 stents  . Colonoscopy   . Laryngoplasty 08/07/2012    Procedure: LARYNGOPLASTY;  Surgeon: Izora Gala, MD;  Location: [place];  Service: ENT;  Laterality: Left;  Left Vocal Cord Medialyzation   HPI:     Prior Functional Status     General  HPI: Patient with a past medical history of hypertension, CAD, ESRD and diabetes mellitus presents with a history of slurred speech this morning at breakfast. The daughter is at bedside reports she was not at the house when the slurred speech started but he continues to sound different to her, as if he does not have full control of  his tongue. She is concerned her father is having a stroke. He admits to generalized weakness. He denies numbness/tingling, difficulty swallowing, unilateral weakness/numbness, chest pain, visual changes, and SOB.   Pt. with recent history (aprproximately  1 week ago) of Laryngoplasty for parylized left vocal cord, to decrease aspiration and improve voice.  Patient with TIA in May of this year as well.  Also with history of GERD and DM. Type of Study: Bedside swallow evaluation Previous Swallow Assessment: None per patient and family.  Dr. Constance Holster recommended a chin tuck to decrease aspiration risk, and placed patient on a regular diet with thin liquids. Diet Prior to this Study: Regular;Thin liquids Temperature Spikes Noted:  (Low grade) Respiratory Status: Room air History of Recent Intubation: No Behavior/Cognition: Alert;Cooperative Oral Cavity - Dentition: Adequate natural dentition Self-Feeding Abilities: Able to feed self;Needs set up Patient Positioning: Upright in bed Baseline Vocal Quality: Low vocal intensity (slightly raspy) Volitional Cough: Strong;Congested Volitional Swallow: Able to elicit  Oral Motor/Sensory Function  Overall Oral Motor/Sensory Function: Impaired Labial ROM: Reduced left Labial Symmetry: Abnormal symmetry left Labial Strength: Reduced Labial Sensation: Reduced Lingual ROM: Within Functional Limits  Lingual Symmetry: Within Functional Limits Lingual Strength: Within Functional Limits Facial ROM: Reduced left Facial Symmetry: Left droop Facial Strength: Reduced Facial Sensation: Reduced Mandible: Within Functional Limits  Consistency Results  Ice Chips Ice chips: Not tested  Thin Liquid Thin Liquid: Impaired Presentation: Cup Pharyngeal  Phase Impairments: Suspected delayed Swallow;Wet Vocal Quality;Cough - Immediate  Nectar Thick Liquid Nectar Thick Liquid: Impaired Presentation: Cup Pharyngeal Phase Impairments: Suspected delayed Swallow;Throat  Clearing - Immediate;Cough - Delayed  Honey Thick Liquid Honey Thick Liquid: Not tested  Puree Puree: Within functional limits  SLP Goals     Assessment/Plan  Patient Active Problem List  Diagnosis  . DIABETES MELLITUS, TYPE II, UNCONTROLLED  . HYPERLIPIDEMIA-MIXED  . ERECTILE DYSFUNCTION  . MORTON'S NEUROMA, RIGHT  . HYPERTENSION, BENIGN  . CAD, NATIVE VESSEL  . ALLERGIC RHINITIS  . GERD  . SHOULDER PAIN, RIGHT  . Headache  . TOBACCO USE, QUIT  . Preventative health care  . TIA (transient ischemic attack)  . Dysphagia  . Stroke  . Constipation  . Chronic back pain  . Peripheral neuropathy  . CVA (cerebral infarction)      SLP Assessment/Plan Clinical Impression Statement: Patient exhibits s/s of pharyngeal dysphagia with probable aspiration of thin liquids, and possible laryngeal residue after the swallow with solids.  In light of potential new CVA in addition to previous vocal cord paralysis and question of tolerance of regular diet/thin liquids, recommend objective testing be completed prior to p.o.'s being initiated.  If p.o. meds are needes, recommend whole meds with applesacue.  Patient and family stated patient has not had formal swallow assessment, despite his previous history of dysphagia.[name], [name] T 08/14/2012,4:59 PM

## 2012-08-14 NOTE — ED Notes (Signed)
Pt's oral secretions suctioned.  Pt and family updated on plan.

## 2012-08-14 NOTE — Progress Notes (Signed)
Disposition Note  [name], is a 61 y.o. male,   MRN: [id number]  -  DOB - [birth year]  Outpatient Primary MD for the patient is [name], MD   Blood pressure 145/88, pulse 81, temperature 99 F (37.2 C), temperature source Oral, resp. rate 15, SpO2 100.00%.  Active Problems:  CVA (cerebral infarction)  DIABETES MELLITUS, TYPE II, UNCONTROLLED  HYPERLIPIDEMIA-MIXED  MORTON'S NEUROMA, RIGHT  HYPERTENSION, BENIGN  GERD  SHOULDER PAIN, RIGHT  Headache  TIA (transient ischemic attack)  Dysphagia  Stroke  Constipation  Chronic back pain   61 yo with ho stroke was driving to have blood work done (before 10 am this morning) and developed numbness in his face and left leg weakness.  He has been on plavix for previous stroke but was taken off of it on 9/11 in prep for laryngeal surgery on 9/16.  Plavix was resumed 9/16 after surgery.    CT head neg.  Labs are not particularly abnormal.  VSS.  Patient failed Swallow eval in ED by Speech Therapy.  Neuro has seen the patient in the ED and requests TR[facility] admit.    I've requested Neuro tele bed.  Imogene Burn, PA-C [facility] Pager: [phone]

## 2012-08-14 NOTE — Progress Notes (Signed)
ANTICOAGULATION CONSULT NOTE - Initial Consult  Pharmacy Consult for Heparin Indication: stroke  Allergies  Allergen Reactions  . Penicillins Anaphylaxis  . Pneumococcal Vaccines Anaphylaxis    Pt states allergic to it.  . Shellfish Allergy Anaphylaxis    Patient Measurements:   Heparin Dosing Weight: 94.8 kg  Vital Signs: Temp: 99 F (37.2 C) (09/23 1247) Temp src: Oral (09/23 1214) BP: 155/90 mmHg (09/23 1830) Pulse Rate: 74  (09/23 1830)  Labs:  Basename 08/14/12 1225 08/14/12 1224  HGB -- 12.7*  HCT -- 36.4*  PLT -- 207  APTT -- 34  LABPROT -- 13.7  INR -- 1.06  HEPARINUNFRC -- --  CREATININE -- 1.04  CKTOTAL -- --  CKMB -- --  TROPONINI <0.30 --    The CrCl is unknown because both a height and weight (above a minimum accepted value) are required for this calculation.   Medical History: Past Medical History  Diagnosis Date  . ALLERGIC RHINITIS   . CAD, NATIVE VESSEL     BMS to OM1 2001, DES to BMS 2005  . DIAB W/UNSPEC COMP TYPE II/UNSPEC TYPE UNCNTRL   . ERECTILE DYSFUNCTION   . HYPERLIPIDEMIA-MIXED     takes Lipitor daily  . MORTON'S NEUROMA, RIGHT   . SHOULDER PAIN, RIGHT   . PONV (postoperative nausea and vomiting)   . HYPERTENSION, BENIGN     takes Lisinopril and Atenolol daily  . Coughing   . Shortness of breath     lying/sitting/exertion  . Headache     occasionally  . Peripheral neuropathy   . Arthritis   . Joint pain   . Joint swelling   . Chronic back pain     herniated disc  . GERD     takes Protonix daily  . Constipation     takes Carafate four times day  . Seasonal allergies     takes Allegra and Benadryl daily prn;uses Flonase daily  . Diabetes mellitus     on insulin  . Insomnia     doesn't require meds  . Stroke 02/2012    takes Plavix daily;on hold for surgery;rt sided weakness    Medications:  Scheduled:    . ondansetron (ZOFRAN) IV  4 mg Intravenous Once    Assessment: 61 yr old male presents with left  sided weakness and numbness. He had laryngeal surgery one week ago. Pt was initially improving, then started to decline. Heparin was ordered. Goal of Therapy:  Heparin level 0.3-0.5 Monitor platelets by anticoagulation protocol: Yes   Plan:  Will start heparin at 1250 units/hr (13 units/kg/hr) no bolus. Will check heparin level in 6 hrs, then daily heparin level and CBC while on heparin.  Minta Balsam 08/14/2012,7:10 PM

## 2012-08-14 NOTE — ED Notes (Signed)
This RN raised pt head of bed as pt was having inc trouble clearing airway in flat bed position.

## 2012-08-14 NOTE — Progress Notes (Signed)
CTSP:  Patient is now having worsening of symptoms per patient and family.    Mental Status: Alert, oriented, thought content appropriate.  Dysarthric .  Cranial Nerves: II: pupils equal, round, reactive to light and accommodation, right visual field cut III,IV, VI: ptosis not present, extra-ocular motions intact bilaterally V,VII: smile asymmetric, no sensation to left face VIII: hearing normal bilaterally IX,X: gag reflex present XI: unable to test. XII: tongue strength normal  Motor: Right : Upper extremity   5/5    Left:     Upper extremity   2/5  Lower extremity   5/5     Lower extremity   0/5 Decreased tone. Sensory: Pinprick and light touch intact throughout, bilaterally Deep Tendon Reflexes: 2+ and symmetric throughout Plantars: Right: downgoing   Left: downgoing Cerebellar: normal finger-to-nose, normal rapid alternating movements and normal heel-to-shin test unable normal gait and station    I have discussed this patient with CCM and TRH. We will admit to the ICU with CCM consulting. Repeat CT shows no acute findings.   Wayland Denis, MBA, [facility] Pager [phone]

## 2012-08-14 NOTE — Code Documentation (Signed)
This 61 yo male was at home this morning, when a family member noticed that he was having more difficulty getting out of his chair.  He has some residual L body deficits from a stroke earlier this year. He has been on plavix since that time but stopped it on 9/11 in preparation for laryngeal surgery on 9/16. The plavix was resumed the same day of surgery. EMS was called when the sx worsened around 1000 this AM.  The pt arrived at 1207, was seen by the EDP at 1220 and code stroke was activated at 1222.  Code team was with another code stroke but met the pt in CT at 1234. CT scan showed now acute abnormality. NIHSS=6, improving to 5 (facial droop resolved)... See doc flowsheet.  Since the pt is improving and only a few days post laryngeal surgery, he was deemed not to be a candidate for tPA. Code stroke canceled at 1255. Pt will be admitted for stroke workup.  Family and pt aware of plans and agreeable with plan.

## 2012-08-14 NOTE — ED Notes (Addendum)
Pt voice noted to be raspy, is clearing airway, but at times needs to be suctioned by self. Per family-voice has been raspy after throat surgery on Monday 9/16 and pt has had inc secretions since then as well. Pt 100% o2 on room air. Pt family states that pt appears to be weaker. Currently unable to move left arm. Right arm is weaker than before.

## 2012-08-14 NOTE — Consult Note (Signed)
Chief Complaint: Left-sided weakness and numbness.  HPI: [name] is an 61 y.o. male with a history of TIA, diabetes mellitus, hyperlipidemia and hypertension, presenting with acute recurrent weakness and numbness involving his left side. He had a TIA with similar presentation in May 2013. Workup was unremarkable. Patient is on aspirin at that time and was switched to Plavix 75 mg per day. CT scan of his head today showed no acute intracranial abnormality. Left facial weakness as well as left upper extremity weakness improved after arriving in the emergency room. Numbness was persistent, mass was moderately severe weakness involving left lower extremity proximally. NIH stroke score initially was 6. Subsequent score was 5. He is status post laryngeal surgery less than one week ago.  LSN: 10 AM today tPA Given: No: Resolving deficits and recent major surgery. MRankin: 2  Past Medical History  Diagnosis Date  . ALLERGIC RHINITIS   . CAD, NATIVE VESSEL     BMS to OM1 2001, DES to BMS 2005  . DIAB W/UNSPEC COMP TYPE II/UNSPEC TYPE UNCNTRL   . ERECTILE DYSFUNCTION   . HYPERLIPIDEMIA-MIXED     takes Lipitor daily  . MORTON'S NEUROMA, RIGHT   . SHOULDER PAIN, RIGHT   . PONV (postoperative nausea and vomiting)   . HYPERTENSION, BENIGN     takes Lisinopril and Atenolol daily  . Coughing   . Shortness of breath     lying/sitting/exertion  . Headache     occasionally  . Peripheral neuropathy   . Arthritis   . Joint pain   . Joint swelling   . Chronic back pain     herniated disc  . GERD     takes Protonix daily  . Constipation     takes Carafate four times day  . Seasonal allergies     takes Allegra and Benadryl daily prn;uses Flonase daily  . Diabetes mellitus     on insulin  . Insomnia     doesn't require meds  . Stroke 02/2012    takes Plavix daily;on hold for surgery;rt sided weakness    Family History  Problem Relation Age of Onset  . Stroke       Medications:   Prior to Admission:  Tenormin 25 mg per day Plavix 75 mg per day Benadryl 25 mg every 6 hours when necessary Allegra 180 mg per day when necessary Flonase 2 sprays in each nostril daily Hydrochlorothiazide 12.5 mg per day Lortab one every 6 hours when necessary NovoLog 4 units in the morning and at night and 2 units at lunch Fosinopril 40 mg per day Nitroglycerin 0.4 mg SL when necessary Protonix 40 mg twice a day Pravachol 40 mg at bedtime Phenergan 25 mg positive very every 6 hours when necessary Carafate 10 cc 4 times a day  Physical Examination: Blood pressure 164/95, pulse 76, temperature 99 F (37.2 C), temperature source Oral, resp. rate 16, SpO2 100.00%.  Neurologic Examination: Mental Status: Alert, oriented, thought content appropriate.  Speech fluent without evidence of aphasia. Able to follow commands without difficulty. Cranial Nerves: II-Visual fields were normal. III/IV/VI-Pupils were equal and reacted. Extraocular movements were full and conjugate.    V/VII-reduced perception of tactile sensation over left lower face; no facial weakness. VIII-normal. X-moderately hoarse associated with recent laryngeal surgery; no dysarthria. XII-midline tongue extension Motor: 5/5 strength of right upper and lower extremities; slight proximal weakness of left upper extremity moderately severe proximal weakness of left lower extremity. Sensory: Reduced perception of tactile sensation over left  arm and leg compared to right extremities. Deep Tendon Reflexes: 2+ and symmetric. Plantars: Flexor on the right and mute on the left. Cerebellar: Normal finger-to-nose testing. Carotid auscultation: Normal   Ct Head Wo Contrast  08/14/2012  *RADIOLOGY REPORT*  Clinical Data: Code stroke.  Left-sided weakness.  CT HEAD WITHOUT CONTRAST  Technique:  Contiguous axial images were obtained from the base of the skull through the vertex without contrast.  Comparison: 04/07/2012  Findings: There  is atrophy and chronic small vessel disease changes. No acute intracranial abnormality.  Specifically, no hemorrhage, hydrocephalus, mass lesion, acute infarction, or significant intracranial injury.  No acute calvarial abnormality. Visualized paranasal sinuses and mastoids clear.  Orbital soft tissues unremarkable.  IMPRESSION: No acute intracranial abnormality.  Atrophy, chronic microvascular disease.  Critical Value/emergent results were called by telephone at the time of interpretation on 08/14/2012 at 12:46 p.m. to Dr. Nicole Kindred, who verbally acknowledged these results.   Original Report Authenticated By: Raelyn Number, M.D.     Assessment: 61 y.o. 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 Acute ischemic small vessel stroke cannot be ruled out at this point however.  Stroke Risk Factors - diabetes mellitus, hyperlipidemia and hypertension  Plan: 1. HgbA1c, fasting lipid panel 2. MRI, MRA  of the brain without contrast 3. PT consult, OT consult, Speech consult 4. Carotid dopplers 5. Prophylactic therapy-Antiplatelet med: Plavix 75 mg per day 6. Risk factor modification 8. Telemetry monitoring  C.R. Nicole Kindred, MD [facility] [phone]  08/14/2012, 2:03 PM

## 2012-08-14 NOTE — ED Notes (Signed)
This RN transported pt to CT. 

## 2012-08-15 ENCOUNTER — Encounter (HOSPITAL_COMMUNITY): Payer: Self-pay | Admitting: Nurse Practitioner

## 2012-08-15 ENCOUNTER — Ambulatory Visit: Payer: Medicare Other | Admitting: Gastroenterology

## 2012-08-15 ENCOUNTER — Inpatient Hospital Stay (HOSPITAL_COMMUNITY): Payer: Medicare Other

## 2012-08-15 DIAGNOSIS — R1310 Dysphagia, unspecified: Secondary | ICD-10-CM

## 2012-08-15 DIAGNOSIS — I6789 Other cerebrovascular disease: Secondary | ICD-10-CM

## 2012-08-15 LAB — CBC
HCT: 33.6 % — ABNORMAL LOW (ref 39.0–52.0)
Hemoglobin: 11.8 g/dL — ABNORMAL LOW (ref 13.0–17.0)
MCH: 31.1 pg (ref 26.0–34.0)
MCHC: 35.1 g/dL (ref 30.0–36.0)
MCV: 88.4 fL (ref 78.0–100.0)
Platelets: 189 10*3/uL (ref 150–400)
RBC: 3.8 MIL/uL — ABNORMAL LOW (ref 4.22–5.81)
RDW: 12.2 % (ref 11.5–15.5)
WBC: 6.4 10*3/uL (ref 4.0–10.5)

## 2012-08-15 LAB — LIPID PANEL
Cholesterol: 106 mg/dL (ref 0–200)
Cholesterol: 90 mg/dL (ref 0–200)
HDL: 44.1 mg/dL (ref >39.00)
HDL: 52 mg/dL (ref >39)
LDL Cholesterol: 40 mg/dL (ref 0–99)
LDL Cholesterol: 26 mg/dL (ref 0–99)
Total CHOL/HDL Ratio: 2
Total CHOL/HDL Ratio: 1.7 RATIO
Triglycerides: 108 mg/dL (ref 0.0–149.0)
Triglycerides: 59 mg/dL (ref <150)
VLDL: 21.6 mg/dL (ref 0.0–40.0)
VLDL: 12 mg/dL (ref 0–40)

## 2012-08-15 LAB — GLUCOSE, CAPILLARY
Glucose-Capillary: 129 mg/dL — ABNORMAL HIGH (ref 70–99)
Glucose-Capillary: 113 mg/dL — ABNORMAL HIGH (ref 70–99)
Glucose-Capillary: 141 mg/dL — ABNORMAL HIGH (ref 70–99)
Glucose-Capillary: 129 mg/dL — ABNORMAL HIGH (ref 70–99)
Glucose-Capillary: 181 mg/dL — ABNORMAL HIGH (ref 70–99)

## 2012-08-15 LAB — HEPATIC FUNCTION PANEL
ALT: 23 U/L (ref 0–53)
AST: 22 U/L (ref 0–37)
Albumin: 4 g/dL (ref 3.5–5.2)
Alkaline Phosphatase: 59 U/L (ref 39–117)
Bilirubin, Direct: 0.1 mg/dL (ref 0.0–0.3)
Total Bilirubin: 1 mg/dL (ref 0.3–1.2)
Total Protein: 7.5 g/dL (ref 6.0–8.3)

## 2012-08-15 LAB — HEMOGLOBIN A1C
Hgb A1c MFr Bld: 9.4 % — ABNORMAL HIGH (ref <5.7)
Mean Plasma Glucose: 223 mg/dL — ABNORMAL HIGH (ref <117)

## 2012-08-15 LAB — HEPARIN LEVEL (UNFRACTIONATED): Heparin Unfractionated: 0.4 IU/mL (ref 0.30–0.70)

## 2012-08-15 LAB — APTT: aPTT: 52 seconds — ABNORMAL HIGH (ref 24–37)

## 2012-08-15 MED ORDER — PANTOPRAZOLE SODIUM 40 MG PO TBEC
40.0000 mg | DELAYED_RELEASE_TABLET | Freq: Two times a day (BID) | ORAL | Status: DC
  Administered 2012-08-15 – 2012-08-18 (×6): 40 mg via ORAL
  Filled 2012-08-15 (×5): qty 1

## 2012-08-15 MED ORDER — LISINOPRIL 40 MG PO TABS
40.0000 mg | ORAL_TABLET | Freq: Every day | ORAL | Status: DC
  Filled 2012-08-15: qty 1

## 2012-08-15 MED ORDER — ASPIRIN 300 MG RE SUPP: 300.0000 mg | Freq: Every day | RECTAL | Status: DC

## 2012-08-15 MED ORDER — ASPIRIN 300 MG RE SUPP
300.0000 mg | Freq: Once | RECTAL | Status: AC
  Administered 2012-08-15: 300 mg via RECTAL
  Filled 2012-08-15: qty 1

## 2012-08-15 MED ORDER — CLOPIDOGREL BISULFATE 75 MG PO TABS
75.0000 mg | ORAL_TABLET | Freq: Every day | ORAL | Status: DC
  Administered 2012-08-16 – 2012-08-18 (×3): 75 mg via ORAL
  Filled 2012-08-15 (×3): qty 1

## 2012-08-15 MED ORDER — GLUCERNA SHAKE PO LIQD
237.0000 mL | Freq: Every day | ORAL | Status: DC
  Administered 2012-08-15 – 2012-08-17 (×3): 237 mL via ORAL

## 2012-08-15 MED ORDER — FLUTICASONE PROPIONATE 50 MCG/ACT NA SUSP
2.0000 | Freq: Every day | NASAL | Status: DC
  Administered 2012-08-15 – 2012-08-17 (×3): 2 via NASAL
  Filled 2012-08-15: qty 16

## 2012-08-15 MED ORDER — HYDROCHLOROTHIAZIDE 12.5 MG PO CAPS
12.5000 mg | ORAL_CAPSULE | Freq: Every day | ORAL | Status: DC
  Administered 2012-08-15 – 2012-08-18 (×4): 12.5 mg via ORAL
  Filled 2012-08-15 (×4): qty 1

## 2012-08-15 MED ORDER — INFLUENZA VIRUS VACC SPLIT PF IM SUSP: 0.5000 mL | INTRAMUSCULAR | Status: DC | PRN

## 2012-08-15 MED ORDER — ATENOLOL 25 MG PO TABS
25.0000 mg | ORAL_TABLET | Freq: Every day | ORAL | Status: DC
  Administered 2012-08-15 – 2012-08-18 (×4): 25 mg via ORAL
  Filled 2012-08-15 (×4): qty 1

## 2012-08-15 MED ORDER — ENOXAPARIN SODIUM 40 MG/0.4ML ~~LOC~~ SOLN
40.0000 mg | SUBCUTANEOUS | Status: DC
  Administered 2012-08-16 – 2012-08-18 (×3): 40 mg via SUBCUTANEOUS
  Filled 2012-08-15 (×3): qty 0.4

## 2012-08-15 MED ORDER — SIMVASTATIN 20 MG PO TABS
20.0000 mg | ORAL_TABLET | Freq: Every day | ORAL | Status: DC
  Administered 2012-08-15 – 2012-08-16 (×2): 20 mg via ORAL
  Filled 2012-08-15 (×3): qty 1

## 2012-08-15 MED ORDER — INSULIN ASPART 100 UNIT/ML ~~LOC~~ SOLN: 2.0000 [IU] | Freq: Three times a day (TID) | SUBCUTANEOUS | Status: DC

## 2012-08-15 NOTE — Research (Signed)
Patient was evaluated for a potential candidate for the IMPACT 24 trial. Patient did not met the inclusion criteria due to having a stroke in April of this year.

## 2012-08-15 NOTE — Procedures (Signed)
Objective Swallowing Evaluation: Fiberoptic Endoscopic Evaluation of Swallowing  Patient Details  Name: [name] MRN: [id number] Date of Birth: May 21, 1951  Today's Date: 08/15/2012 Time: 0927-0955 SLP Time Calculation (min): 28 min  Past Medical History:  Past Medical History  Diagnosis Date  . ALLERGIC RHINITIS   . CAD, NATIVE VESSEL     BMS to OM1 2001, DES to BMS 2005  . DIAB W/UNSPEC COMP TYPE II/UNSPEC TYPE UNCNTRL   . ERECTILE DYSFUNCTION   . HYPERLIPIDEMIA-MIXED     takes Lipitor daily  . MORTON'S NEUROMA, RIGHT   . SHOULDER PAIN, RIGHT   . PONV (postoperative nausea and vomiting)   . HYPERTENSION, BENIGN     takes Lisinopril and Atenolol daily  . Coughing   . Shortness of breath     lying/sitting/exertion  . Headache     occasionally  . Peripheral neuropathy   . Arthritis   . Joint pain   . Joint swelling   . Chronic back pain     herniated disc  . GERD     takes Protonix daily  . Constipation     takes Carafate four times day  . Seasonal allergies     takes Allegra and Benadryl daily prn;uses Flonase daily  . Diabetes mellitus     on insulin  . Insomnia     doesn't require meds  . TIA on medication 02/2012    takes Plavix daily;on hold for surgery;rt sided weakness   Past Surgical History:  Past Surgical History  Procedure Date  . Stent 2001, 2004    coronary stents  . Angioplasty   . Left knee surgury     x 2   . Dental surgery   . Coronary angioplasty 2005    2 stents  . Colonoscopy   . Laryngoplasty 08/07/2012    Procedure: LARYNGOPLASTY;  Surgeon: Izora Gala, MD;  Location: [facility];  Service: ENT;  Laterality: Left;  Left Vocal Cord Medialyzation   HPI:  Patient with a past medical history of hypertension, CAD, ESRD and diabetes mellitus presents with a history of slurred speech this morning at breakfast. The daughter is at bedside reports she was not at the house when the slurred speech started but he continues to sound different to  her, as if he does not have full control of his tongue. She is concerned her father is having a stroke. He admits to generalized weakness. He denies numbness/tingling, difficulty swallowing, unilateral weakness/numbness, chest pain, visual changes, and SOB.   Pt. with recent history (aprproximately  1 week ago) of Laryngoplasty for parylized left vocal cord, to decrease aspiration and improve voice.  Patient with TIA in May of this year as well.  Also with history of GERD and DM.     Assessment / Plan / Recommendation Clinical Impression  Dysphagia Diagnosis: Moderate pharyngeal phase dysphagia;Severe pharyngeal phase dysphagia (suspect secondary esophageal component) Clinical impression: Patient presents with a mod-severe pharyngeal based dysphagia, likely multi-factorial in nature due to recent laryngoplasty one week ago for paralyzed left vocal cord (was aspirating prior to procedure) with post-op edema and laryngeal/pharyngeal weakness s/p possible new CVA resulting in penetration and aspiration of liquids as patient attempts to clear moderate pharyngeal residuals. The use of various head postures does not decrease residuals or prevent penetration/aspiration. Fortunately however, cough response strong and appearing effective to clear the airway. Patient reports mild improvement in swallowing function s/p surgery, now worsened ("slightly") since acute admission s/p possible CVA. MRI pending. Recommend maintaining  a conservative diet with strict precautions as current function is sounding likely close to baseline s/p surgery. Continue to question however impact of new possible CVA. MRI results will be helpful in further diagnosis of difficulty and prognosis for improvement. Patient and wife verbalized understanding of diet recommendations, compensatory strategies, and increased aspiration risk at this time.     Treatment Recommendation  Therapy as outlined in treatment plan below    Diet Recommendation  Dysphagia 2 (Fine chop);Thin liquid   Liquid Administration via: Cup;No straw Medication Administration: Crushed with puree Supervision: Patient able to self feed;Full supervision/cueing for compensatory strategies Compensations: Slow rate;Small sips/bites;Multiple dry swallows after each bite/sip;Hard cough after swallow Postural Changes and/or Swallow Maneuvers: Seated upright 90 degrees;Upright 30-60 min after meal    Other  Recommendations Oral Care Recommendations: Oral care BID   Follow Up Recommendations  Outpatient SLP    Frequency and Duration min 3x week  2 weeks       SLP Swallow Goals Patient will utilize recommended strategies during swallow to increase swallowing safety with: Modified independent assistance Swallow Study Goal #2 - Progress: Not met   General HPI: Patient with a past medical history of hypertension, CAD, ESRD and diabetes mellitus presents with a history of slurred speech this morning at breakfast. The daughter is at bedside reports she was not at the house when the slurred speech started but he continues to sound different to her, as if he does not have full control of his tongue. She is concerned her father is having a stroke. He admits to generalized weakness. He denies numbness/tingling, difficulty swallowing, unilateral weakness/numbness, chest pain, visual changes, and SOB.   Pt. with recent history (aprproximately  1 week ago) of Laryngoplasty for parylized left vocal cord, to decrease aspiration and improve voice.  Patient with TIA in May of this year as well.  Also with history of GERD and DM. Type of Study: Fiberoptic Endoscopic Evaluation of Swallowing Reason for Referral: Objectively evaluate swallowing function Previous Swallow Assessment: None per patient and family.  Dr. Constance Holster recommended a chin tuck to decrease aspiration risk, and placed patient on a regular diet with thin liquids. (Bedside swallow eval complete 9/23 recommended FEES) Diet  Prior to this Study: NPO Temperature Spikes Noted: No Respiratory Status: Room air History of Recent Intubation: No Behavior/Cognition: Alert;Cooperative Oral Cavity - Dentition: Adequate natural dentition Oral Motor / Sensory Function: Impaired - see Bedside swallow eval Self-Feeding Abilities: Able to feed self Patient Positioning: Upright in bed Baseline Vocal Quality: Hoarse;Low vocal intensity (due to left vocal cord paralysis s/p repair) Volitional Cough: Strong Volitional Swallow: Able to elicit Anatomy: Other (Comment) (appearance of moderate pharyngeal edema, narrow epiglottis) Pharyngeal Secretions: Standing secretions in (comment) (thin clear secretions throughout laryngeal vestibule)    Reason for Referral Objectively evaluate swallowing function   Oral Phase Oral Preparation/Oral Phase Oral Phase: WFL   Pharyngeal Phase Pharyngeal Phase Pharyngeal Phase: Impaired Pharyngeal - Nectar Pharyngeal - Nectar Cup: Delayed swallow initiation;Premature spillage to pyriform sinuses;Reduced pharyngeal peristalsis;Reduced anterior laryngeal mobility;Reduced laryngeal elevation;Reduced airway/laryngeal closure;Reduced tongue base retraction;Penetration/Aspiration before swallow;Penetration/Aspiration after swallow;Pharyngeal residue - valleculae;Pharyngeal residue - pyriform sinuses;Pharyngeal residue - cp segment;Inter-arytenoid space residue Penetration/Aspiration details (nectar cup): Material enters airway, remains ABOVE vocal cords and not ejected out Pharyngeal - Thin Pharyngeal - Thin Cup: Delayed swallow initiation;Premature spillage to pyriform sinuses;Reduced pharyngeal peristalsis;Reduced anterior laryngeal mobility;Reduced laryngeal elevation;Reduced airway/laryngeal closure;Reduced tongue base retraction;Penetration/Aspiration before swallow;Penetration/Aspiration after swallow;Pharyngeal residue - valleculae;Pharyngeal residue - pyriform sinuses;Pharyngeal residue - cp  segment;Inter-arytenoid space residue;Penetration/Aspiration during swallow;Moderate aspiration Penetration/Aspiration details (thin cup): Material enters airway, passes BELOW cords then ejected out Pharyngeal - Thin Straw: Delayed swallow initiation;Premature spillage to pyriform sinuses;Reduced pharyngeal peristalsis;Reduced anterior laryngeal mobility;Reduced laryngeal elevation;Reduced airway/laryngeal closure;Reduced tongue base retraction;Penetration/Aspiration before swallow;Penetration/Aspiration after swallow;Pharyngeal residue - valleculae;Pharyngeal residue - pyriform sinuses;Pharyngeal residue - cp segment;Inter-arytenoid space residue;Penetration/Aspiration during swallow;Moderate aspiration Penetration/Aspiration details (thin straw): Material enters airway, passes BELOW cords then ejected out Pharyngeal - Solids Pharyngeal - Puree: Delayed swallow initiation;Premature spillage to valleculae;Reduced pharyngeal peristalsis;Reduced anterior laryngeal mobility;Reduced laryngeal elevation;Reduced airway/laryngeal closure;Reduced tongue base retraction;Pharyngeal residue - valleculae;Pharyngeal residue - pyriform sinuses;Pharyngeal residue - cp segment;Inter-arytenoid space residue Pharyngeal - Mechanical Soft: Delayed swallow initiation;Premature spillage to pyriform sinuses;Reduced pharyngeal peristalsis;Reduced anterior laryngeal mobility;Reduced laryngeal elevation;Reduced airway/laryngeal closure;Reduced tongue base retraction;Penetration/Aspiration during swallow;Penetration/Aspiration after swallow;Pharyngeal residue - valleculae;Pharyngeal residue - pyriform sinuses;Pharyngeal residue - cp segment;Inter-arytenoid space residue;Lateral channel residue Penetration/Aspiration details (mechanical soft): Material enters airway, CONTACTS cords then ejected out Pharyngeal Phase - Comment Pharyngeal Comment: combination of spontaneous and cued hard cough effective to clear the airway and laryngeal  vestibuel  Cervical Esophageal Phase    GO    Cervical Esophageal Phase Cervical Esophageal Phase: Impaired Cervical Esophageal Phase - Nectar Nectar Cup: Reduced cricopharyngeal relaxation (suspect due to CP residuals and occassional backflow ) Cervical Esophageal Phase - Thin Thin Cup: Reduced cricopharyngeal relaxation (suspect due to CP residuals and occassional backflow ) Thin Straw: Reduced cricopharyngeal relaxation (suspect due to CP residuals and occassional backflow ) Cervical Esophageal Phase - Solids Puree: Reduced cricopharyngeal relaxation (suspect due to CP residuals and occassional backflow )        Gabriel Rainwater MA, CCC-SLP [phone]  Alysandra Lobue Meryl 08/15/2012, 11:10 AM

## 2012-08-15 NOTE — Progress Notes (Signed)
Stroke Team Progress Note  HISTORY Bertrand Brosnan is an 61 y.o. male with a history of TIA, diabetes mellitus, hyperlipidemia and hypertension, presenting with acute recurrent weakness and numbness involving his left side. He was last seen normal 08/14/2012 at 1000. He had a TIA with similar presentation in May 2013. Workup was unremarkable. Patient is on aspirin at that time and was switched to Plavix 75 mg per day. CT scan of his head today showed no acute intracranial abnormality. Left facial weakness as well as left upper extremity weakness improved after arriving in the emergency room. Numbness was persistent, mass was moderately severe weakness involving left lower extremity proximally. NIH stroke score initially was 6. Subsequent score was 5. He is status post laryngeal surgery less than one week ago. Patient was not a TPA candidate secondary to recent major surgery and resolving deficits. He was admitted for further evaluation and treatment.  SUBJECTIVE His wife is at the bedside.  Overall he feels his condition is gradually worsening over night. Left sided numbness and weakness is worse.  OBJECTIVE Most recent Vital Signs: Filed Vitals:   08/15/12 0400 08/15/12 0500 08/15/12 0600 08/15/12 0700  BP: 139/81 114/70 153/105 136/81  Pulse: 70 68 71 68  Temp:      TempSrc:      Resp: 12 12 11 11   Height:      Weight:      SpO2: 99% 98% 99% 99%   CBG (last 3)   Basename 08/15/12 0507 08/14/12 2255 08/14/12 1220  GLUCAP 129* 163* 321*   Intake/Output from previous day: 09/23 0701 - 09/24 0700 In: 962.5 [I.V.:962.5] Out: 725 [Urine:725]  IV Fluid Intake:     . sodium chloride 75 mL/hr at 08/15/12 0700  . heparin 1,250 Units/hr (08/15/12 0300)    MEDICATIONS    . aspirin  300 mg Rectal Once  . insulin aspart  0-15 Units Subcutaneous Q4H  . ketorolac  15 mg Intravenous Once  . ondansetron (ZOFRAN) IV  4 mg Intravenous Once  . DISCONTD: insulin aspart  0-15 Units  Subcutaneous Q4H  . DISCONTD: insulin glargine  10 Units Subcutaneous QHS  . DISCONTD: insulin glargine  10 Units Subcutaneous QHS   PRN:  influenza  inactive virus vaccine, iohexol, labetalol, ondansetron (ZOFRAN) IV, senna-docusate  Diet:  NPO  Activity:  Bedrest DVT Prophylaxis:  IV heparin  CLINICALLY SIGNIFICANT STUDIES Basic Metabolic Panel:  Lab [id number] 1224  NA 138  K 4.1  CL 100  CO2 30  GLUCOSE 335*  BUN 10  CREATININE 1.04  CALCIUM 9.4  MG --  PHOS --   Liver Function Tests:  Lab 08/14/12 1224  AST 19  ALT 20  ALKPHOS 67  BILITOT 0.6  PROT 7.5  ALBUMIN 3.8   CBC:  Lab 08/15/12 0025 08/14/12 1224  WBC 6.4 5.3  NEUTROABS -- 2.9  HGB 11.8* 12.7*  HCT 33.6* 36.4*  MCV 88.4 88.6  PLT 189 207   Coagulation:  Lab 08/14/12 1224  LABPROT 13.7  INR 1.06   Cardiac Enzymes:  Lab 08/14/12 1225  CKTOTAL --  CKMB --  CKMBINDEX --  TROPONINI <0.30   Urinalysis: No results found for this basename: COLORURINE:2,APPERANCEUR:2,LABSPEC:2,PHURINE:2,GLUCOSEU:2,HGBUR:2,BILIRUBINUR:2,KETONESUR:2,PROTEINUR:2,UROBILINOGEN:2,NITRITE:2,LEUKOCYTESUR:2 in the last 168 hours Lipid Panel    Component Value Date/Time   CHOL 194 04/08/2012 0310   TRIG 120 04/08/2012 0310   HDL 64 04/08/2012 0310   CHOLHDL 3.0 04/08/2012 0310   VLDL 24 04/08/2012 0310   LDLCALC 106* 04/08/2012 0310  HgbA1C  Lab Results  Component Value Date   HGBA1C 9.0* 04/07/2012    Urine Drug Screen:     Component Value Date/Time   LABOPIA POSITIVE* 08/14/2012 1316   COCAINSCRNUR NONE DETECTED 08/14/2012 1316   LABBENZ NONE DETECTED 08/14/2012 1316   AMPHETMU NONE DETECTED 08/14/2012 1316   THCU NONE DETECTED 08/14/2012 1316   LABBARB NONE DETECTED 08/14/2012 1316    Alcohol Level: No results found for this basename: ETH:2 in the last 168 hours  CT of the brain   08/14/2012   No acute intracranial abnormality.   08/14/2012  No acute intracranial abnormality.  Atrophy, chronic microvascular disease.     CT Angio Neck  08/14/2012   No extracranial vascular abnormalities detected.  No flow limiting stenosis or dissection.  No change from prior normal spine.      CT Angio Head  08/14/2012   Unremarkable CT angiography of the intracranial circulation.  No change from prior normal study.    MRI of the brain    MRA of the brain    2D Echocardiogram    Carotid Doppler    CXR  08/14/2012   No acute cardiopulmonary disease.     EKG  unchanged from previous tracings, normal sinus rhythm.   Therapy Recommendations PT - ; OT - ; ST -   Physical Exam   Pleasant middle-aged [place] American male currently not in distress.Awake alert. Afebrile. Head is nontraumatic. Neck is supple without bruit. Hearing is normal. Cardiac exam no murmur or gallop. Lungs are clear to auscultation. Distal pulses are well felt.  Neurological Exam : awake alert oriented to time place and person. Severe dysarthria but can be understood. No aphasia. Extraocular moments are full range though there is slight a right gaze preference. He blinks to threat bilaterally. Fundi were not visualized. Vision acuity appears normal. There is moderate left lower facial weakness. Tongue is midline. Cough and gag a week. He has been slept hemiplegia with grade 2/5 strength in the left upper extremity and 1/5 strength in the left lower extremity. Sensation is impaired on the left side. Coordination and gait could not be tested. Left plantar is upgoing. Right is downgoing. NIH stroke scale is 19 ASSESSMENT Mr. Treyshaun Hasbun is a 61 y.o. male presenting with weakness and numbness involving his left side, likely right  anterior subcortical infarct. Imaging pending; work up underway. Neuro worsening over night. On clopidogrel 75 mg orally every day prior to admission. Now on aspirin 81 mg orally every day and heparin for secondary stroke prevention. Patient with resultant left hemiparesis, left hemisensory deficit, .   CAD - stent 2001, 2004;  angioplasty 2005  Diabetes, HgbA1c 9.0  Hyperlipidemia, LDL 106, on statin PTA Hypertension TIA 02/2012, MRI negative for acute stroke   Laryngoplasty 08/07/2012  Hospital day # 1  TREATMENT/PLAN  Stop  heparin, no indication identified  Add aspirin suppository for secondary stroke prevention  Check MRI scan, echocardiogram, Doppler studies. Physical occupational and speech therapy consults.  Consider IMPACT 24  trial-the time frame control meant is limited within the next few hours and patient did not meet that.  Discuss with patient and wife and answered questions.  Burnetta Sabin, MSN, RN, ANVP-BC, ANP-BC, [facility] Pager: [phone] 08/15/2012 8:01 AM  Scribe for Dr. Antony Contras, [place] Director, who has personally reviewed chart, pertinent data, examined the patient and developed the plan of care. Pager:  [phone]

## 2012-08-15 NOTE — Progress Notes (Signed)
[place] for Heparin Indication: stroke  Allergies  Allergen Reactions  . Penicillins Anaphylaxis  . Pneumococcal Vaccines Anaphylaxis    Pt states allergic to it.  . Shellfish Allergy Anaphylaxis    Patient Measurements:   Heparin Dosing Weight: 94.8 kg  Vital Signs: Temp: 98.9 F (37.2 C) (09/24 0000) Temp src: Oral (09/24 0000) BP: 150/69 mmHg (09/24 0015) Pulse Rate: 74  (09/24 0015)  Labs:  Basename 08/15/12 0025 08/14/12 1225 08/14/12 1224  HGB 11.8* -- 12.7*  HCT 33.6* -- 36.4*  PLT 189 -- 207  APTT -- -- 34  LABPROT -- -- 13.7  INR -- -- 1.06  HEPARINUNFRC 0.40 -- --  CREATININE -- -- 1.04  CKTOTAL -- -- --  CKMB -- -- --  TROPONINI -- <0.30 --    The CrCl is unknown because both a height and weight (above a minimum accepted value) are required for this calculation.  Assessment: 61 yr old male with CVA for Heparin  Goal of Therapy:  Heparin level 0.3-0.5 Monitor platelets by anticoagulation protocol: Yes   Plan:  Continue Heparin at current rate  Follow-up am labs.  Caryl Pina 08/15/2012,1:00 AM

## 2012-08-15 NOTE — Consult Note (Signed)
Name: Lawren Florer MRN: [id number] DOB: [birth year]  LOS: 1  CRITICAL CARE CONSULT NOTE  History of Present Illness: Mr. Spear is a pleasant male with CAD, DM2 who presented to the [facility] ED on 9/23 with acute onset left sided weakness and facial droop.  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  His plavix had been held for that procedure.  On 9/23 he began to feel unusual (can't describe details) and his family noted in the afternoon that he had the abrupt onset of left sided weakness.  He was brought to the [facility] ED but was unable to receive tPA due to the recent surgery.  Neurology admitted the patient and we were called for a medical consult while he is in the ICU.  Prior to this he was feeling well without fever, chills, nausea, vomiting, or pain of any kind.    Lines / Drains: peripheral  Cultures / Sepsis markers: none  Antibiotics: none  Tests / Events: 9/23 CT head >> NAICP 9/23 CT angio brain/neck >> no vascular abnormalities noted 9/23 CT head >> NAICP  Vital Signs:   Filed Vitals:   [birth year]15/[birth year] 0500 08/15/62 0600 08/15/12 0700 08/15/12 0800  BP: 114/70 153/105 136/81 137/92  Pulse: 68 71 68 66  Temp:    98 F (36.7 C)  TempSrc:    Oral  Resp: 12 11 11 11   Height:      Weight:      SpO2: 98% 99% 99% 99%    Physical Examination: Gen:  no acute distress HEENT: NCAT, PERRL, EOMi, OP clear, left facial droop Neck: supple without masses PULM: scattered rhonchi bilaterally CV: RRR, no mgr, no JVD AB: BS+, soft, nontender, no hsm Ext: warm, no edema, no clubbing, no cyanosis Derm: no rash or skin breakdown Neuro: awake and alert, answering questions, left facial droop, R side strength normal, left grip strength 3/5, shoulder and hip strength 2/5 Psyche: distressed  Labs and Imaging:   CBC    Component Value Date/Time   WBC 6.4 08/15/2012 0025   RBC 3.80* 08/15/2012 0025   HGB 11.8* 08/15/2012 0025   HCT 33.6* 08/15/2012 0025   PLT  189 08/15/2012 0025   MCV 88.4 08/15/2012 0025   MCH 31.1 08/15/2012 0025   MCHC 35.1 08/15/2012 0025   RDW 12.2 08/15/2012 0025   LYMPHSABS 1.9 08/14/2012 1224   MONOABS 0.4 08/14/2012 1224   EOSABS 0.1 08/14/2012 1224   BASOSABS 0.0 08/14/2012 1224   BMET    Component Value Date/Time   NA 138 08/14/2012 1224   K 4.1 08/14/2012 1224   CL 100 08/14/2012 1224   CO2 30 08/14/2012 1224   GLUCOSE 335* 08/14/2012 1224   BUN 10 08/14/2012 1224   CREATININE 1.04 08/14/2012 1224   CALCIUM 9.4 08/14/2012 1224   GFRNONAA 75* 08/14/2012 1224   GFRAA 87* 08/14/2012 1224   ABG No results found for this basename: phart,  pco2,  pco2art,  po2,  po2art,  hco3,  tco2,  acidbasedef,  o2sat   Assessment and Plan:  61 y/o male with known CAD, DM and hypertension who presented with an acute onset ischaemic stroke with left hemiparalysis on 9/23.  PCCM consulted for medical management in ICU.  Currently protecting airway, vitals WNL, no infectious symptoms.  CVA (cerebral infarction) (08/14/2012)   Assessment: acute ischaemic   Plan:  -heparin gtt and asa per neurology. -bp goals per neurology.  DIABETES MELLITUS, TYPE II, UNCONTROLLED (04/13/2010)  Assessment:    Plan:  -A1c = 9.0. -SSI for goal glucose < 180.  HYPERLIPIDEMIA-MIXED (03/07/2009)   Assessment:    Plan:  -Continue statin.  HYPERTENSION, BENIGN (03/12/2009)   Assessment:    Plan:  -labetalol prn SBP > 200. -maintain current regiment, BP stable.  CAD, NATIVE VESSEL (03/12/2009)   Assessment:    Plan:  -Restart plavix when OK by ENT -ASA given  Recheck labs and monitor in the ICU overnight, likely to move out in AM. Swallow evaluation ordered. PT/OT and OOB to chair per neuro. IS ordered.  Best practices / Disposition:  Feeding/protein malnutrition: npo until swallow screen Analgesia: n/a Sedation: n/a Thromboprophylaxis: hep gtt HOB >30 degrees Ulcer prophylaxis: n/a Glucose control/hyperglycemia: ssi

## 2012-08-15 NOTE — Progress Notes (Signed)
*  PRELIMINARY RESULTS* Echocardiogram 2D Echocardiogram has been performed.  [name] 08/15/2012, 9:23 AM

## 2012-08-15 NOTE — Progress Notes (Signed)
INITIAL ADULT NUTRITION ASSESSMENT Date: 08/15/2012   Time: 3:16 PM  INTERVENTION:  Glucerna Shake daily (220 kcals, 9.9 gm protein per 8 fl oz can) RD to follow for nutrition care plan  Reason for Assessment: Malnutrition Screening Tool Report  ASSESSMENT: Male 61 y.o.  Dx: CVA (cerebral infarction)  Hx:  Past Medical History  Diagnosis Date  . ALLERGIC RHINITIS   . CAD, NATIVE VESSEL     BMS to OM1 2001, DES to BMS 2005  . DIAB W/UNSPEC COMP TYPE II/UNSPEC TYPE UNCNTRL   . ERECTILE DYSFUNCTION   . HYPERLIPIDEMIA-MIXED     takes Lipitor daily  . MORTON'S NEUROMA, RIGHT   . SHOULDER PAIN, RIGHT   . PONV (postoperative nausea and vomiting)   . HYPERTENSION, BENIGN     takes Lisinopril and Atenolol daily  . Coughing   . Shortness of breath     lying/sitting/exertion  . Headache     occasionally  . Peripheral neuropathy   . Arthritis   . Joint pain   . Joint swelling   . Chronic back pain     herniated disc  . GERD     takes Protonix daily  . Constipation     takes Carafate four times day  . Seasonal allergies     takes Allegra and Benadryl daily prn;uses Flonase daily  . Diabetes mellitus     on insulin  . Insomnia     doesn't require meds  . TIA on medication 02/2012    takes Plavix daily;on hold for surgery;rt sided weakness    Related Meds:     . aspirin  300 mg Rectal Once  . aspirin  300 mg Rectal Daily  . enoxaparin (LOVENOX) injection  40 mg Subcutaneous Q24H  . insulin aspart  0-15 Units Subcutaneous Q4H  . ketorolac  15 mg Intravenous Once  . DISCONTD: aspirin  300 mg Rectal Daily  . DISCONTD: aspirin  300 mg Rectal Daily  . DISCONTD: insulin aspart  0-15 Units Subcutaneous Q4H  . DISCONTD: insulin glargine  10 Units Subcutaneous QHS  . DISCONTD: insulin glargine  10 Units Subcutaneous QHS    Ht: 5\' 11"  (180.3 cm)  Wt: 195 lb 5.2 oz (88.6 kg)  Ideal Wt: 78.1 kg % Ideal Wt: 113%  Usual Wt: 215 lb -- May 2013 % Usual Wt: 91%  Body  mass index is 27.24 kg/(m^2).  Food/Nutrition Related Hx: recent weight lost without trying (patient unsure) per admission nutrition screen  Labs:  CMP     Component Value Date/Time   NA 138 08/14/2012 1224   K 4.1 08/14/2012 1224   CL 100 08/14/2012 1224   CO2 30 08/14/2012 1224   GLUCOSE 335* 08/14/2012 1224   BUN 10 08/14/2012 1224   CREATININE 1.04 08/14/2012 1224   CALCIUM 9.4 08/14/2012 1224   PROT 7.5 08/14/2012 1224   ALBUMIN 3.8 08/14/2012 1224   AST 19 08/14/2012 1224   ALT 20 08/14/2012 1224   ALKPHOS 67 08/14/2012 1224   BILITOT 0.6 08/14/2012 1224   GFRNONAA 75* 08/14/2012 1224   GFRAA 87* 08/14/2012 1224     Intake/Output Summary (Last 24 hours) at 08/15/12 1519 Last data filed at 08/15/12 1500  Gross per 24 hour  Intake 1821.25 ml  Output   1125 ml  Net 696.25 ml    CBG (last 3)   Basename 08/15/12 1220 08/15/12 0753 08/15/12 0507  GLUCAP 141* 113* 129*    Diet Order: Dysphagia 2, thin liquids  Supplements/Tube Feeding: N/A  IVF:    sodium chloride Last Rate: 75 mL/hr at 08/15/12 1500  DISCONTD: heparin Last Rate: Stopped (08/15/12 0830)    Estimated Nutritional Needs:   Kcal: 2200-2400 Protein: 110-120 gm Fluid: 2.2-2.4 L  Patient presented with acute recurrent weakness and numbness involving his left side; CT scan of his head today showed no acute intracranial abnormality; s/p laryngoplasty 9/16; FEES completed today; patient reports his appetite is "OK"; per flowsheet records and patient report, he's lost approximately 20 lbs since May (9%); Internal Medicine office visit records reviewed, patient was experiencing dysphagia & regurgitation prior to surgery; he would benefit from addition of supplements -- willing to try Glucerna Shakes -- RD to order.  NUTRITION DIAGNOSIS: -Unintended Weight Loss  RELATED TO: dysphagia & regurgitation   AS EVIDENCE BY: 9% weight loss  MONITORING/EVALUATION(Goals): Goal: Oral intake with meals & supplements to meet  >/= 90% of estimated nutrition needs Monitor: PO & supplemental intake, weight, labs, I/O's  EDUCATION NEEDS: -No education needs identified at this time  Dietitian #: [place] Per approved criteria  -Not Applicable    Lady Deutscher 08/15/2012, 3:16 PM

## 2012-08-15 NOTE — Progress Notes (Signed)
VASCULAR LAB PRELIMINARY  PRELIMINARY  PRELIMINARY  PRELIMINARY  Carotid duplex  completed.    Preliminary report:  Bilateral:  No evidence of hemodynamically significant internal carotid artery stenosis.   Vertebral artery flow is antegrade.      Liliani Bobo, RVT 08/15/2012, 3:59 PM

## 2012-08-15 NOTE — Progress Notes (Signed)
Pt.s wife stated that pt.is allergic to flu vaccine.  He breaks out in hives.  Pharmacist notified and d/c'd order.

## 2012-08-15 NOTE — Progress Notes (Signed)
PT Cancellation Note  Evaluation cancelled today due to medical issues with patient which prohibited therapy: noted per neurology note that pt had worsening symptoms. PT eval order for today was discontinued by MD and new order was written to begin 9/25. Will proceed with PT eval on 9/25 once MD clears for increased activity (pt currently also on bedrest).  Olivier Frayre 08/15/2012, 12:33 PM Pager [phone]

## 2012-08-15 NOTE — Progress Notes (Signed)
Dr. Constance Holster (ENT surgeon) verified it was OK for pt to have FEES swallow study since his recent laryngeal surgery on 08/07/12.

## 2012-08-16 DIAGNOSIS — I6330 Cerebral infarction due to thrombosis of unspecified cerebral artery: Secondary | ICD-10-CM

## 2012-08-16 LAB — MAGNESIUM: Magnesium: 2.1 mg/dL (ref 1.5–2.5)

## 2012-08-16 LAB — POCT I-STAT, CHEM 8
BUN: 10 mg/dL (ref 6–23)
Calcium, Ion: 1.21 mmol/L (ref 1.13–1.30)
Chloride: 101 mEq/L (ref 96–112)
Creatinine, Ser: 1.1 mg/dL (ref 0.50–1.35)
Glucose, Bld: 306 mg/dL — ABNORMAL HIGH (ref 70–99)
HCT: 39 % (ref 39.0–52.0)
Hemoglobin: 13.3 g/dL (ref 13.0–17.0)
Potassium: 4 mEq/L (ref 3.5–5.1)
Sodium: 139 mEq/L (ref 135–145)
TCO2: 26 mmol/L (ref 0–100)

## 2012-08-16 LAB — GLUCOSE, CAPILLARY
Glucose-Capillary: 164 mg/dL — ABNORMAL HIGH (ref 70–99)
Glucose-Capillary: 100 mg/dL — ABNORMAL HIGH (ref 70–99)
Glucose-Capillary: 149 mg/dL — ABNORMAL HIGH (ref 70–99)
Glucose-Capillary: 178 mg/dL — ABNORMAL HIGH (ref 70–99)
Glucose-Capillary: 193 mg/dL — ABNORMAL HIGH (ref 70–99)

## 2012-08-16 LAB — CBC
HCT: 35.7 % — ABNORMAL LOW (ref 39.0–52.0)
Hemoglobin: 12.5 g/dL — ABNORMAL LOW (ref 13.0–17.0)
MCH: 30.7 pg (ref 26.0–34.0)
MCHC: 35 g/dL (ref 30.0–36.0)
MCV: 87.7 fL (ref 78.0–100.0)
Platelets: 205 10*3/uL (ref 150–400)
RBC: 4.07 MIL/uL — ABNORMAL LOW (ref 4.22–5.81)
RDW: 12.1 % (ref 11.5–15.5)
WBC: 6.3 10*3/uL (ref 4.0–10.5)

## 2012-08-16 LAB — BASIC METABOLIC PANEL
BUN: 9 mg/dL (ref 6–23)
CO2: 25 mEq/L (ref 19–32)
Calcium: 9.2 mg/dL (ref 8.4–10.5)
Chloride: 103 mEq/L (ref 96–112)
Creatinine, Ser: 1.12 mg/dL (ref 0.50–1.35)
GFR calc Af Amer: 80 mL/min — ABNORMAL LOW (ref >90)
GFR calc non Af Amer: 69 mL/min — ABNORMAL LOW (ref >90)
Glucose, Bld: 114 mg/dL — ABNORMAL HIGH (ref 70–99)
Potassium: 3.3 mEq/L — ABNORMAL LOW (ref 3.5–5.1)
Sodium: 138 mEq/L (ref 135–145)

## 2012-08-16 LAB — PHOSPHORUS: Phosphorus: 3.6 mg/dL (ref 2.3–4.6)

## 2012-08-16 MED ORDER — POTASSIUM CHLORIDE CRYS ER 20 MEQ PO TBCR
40.0000 meq | EXTENDED_RELEASE_TABLET | Freq: Once | ORAL | Status: AC
  Administered 2012-08-16: 40 meq via ORAL
  Filled 2012-08-16: qty 2

## 2012-08-16 MED ORDER — ACETAMINOPHEN 325 MG PO TABS
650.0000 mg | ORAL_TABLET | Freq: Four times a day (QID) | ORAL | Status: DC | PRN
  Administered 2012-08-16: 650 mg via ORAL

## 2012-08-16 MED ORDER — LISINOPRIL 2.5 MG PO TABS
2.5000 mg | ORAL_TABLET | Freq: Every day | ORAL | Status: DC
  Administered 2012-08-16 – 2012-08-18 (×3): 2.5 mg via ORAL
  Filled 2012-08-16 (×4): qty 1

## 2012-08-16 MED ORDER — INSULIN ASPART 100 UNIT/ML ~~LOC~~ SOLN
0.0000 [IU] | Freq: Three times a day (TID) | SUBCUTANEOUS | Status: DC
  Administered 2012-08-17: 3 [IU] via SUBCUTANEOUS
  Administered 2012-08-17: 5 [IU] via SUBCUTANEOUS
  Administered 2012-08-18: 3 [IU] via SUBCUTANEOUS
  Administered 2012-08-18: 5 [IU] via SUBCUTANEOUS

## 2012-08-16 NOTE — Progress Notes (Signed)
Name: [name] MRN: [id number] DOB: Feb 15, 1951  LOS: 2  CRITICAL CARE CONSULT NOTE  History of Present Illness: [name] is a pleasant male with CAD, DM2 who presented to the [facility] ED on 9/23 with acute onset left sided weakness and facial droop.  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  His plavix had been held for that procedure.  On 9/23 he began to feel unusual (can't describe details) and his family noted in the afternoon that he had the abrupt onset of left sided weakness.  He was brought to the [facility] ED but was unable to receive tPA due to the recent surgery.  Neurology admitted the patient and we were called for a medical consult while he is in the ICU.  Prior to this he was feeling well without fever, chills, nausea, vomiting, or pain of any kind.    Lines / Drains: peripheral  Cultures / Sepsis markers: none  Antibiotics: none  Tests / Events: 9/23 CT head >> NAICP 9/23 CT angio brain/neck >> no vascular abnormalities noted 9/23 CT head >> NAICP  Vital Signs:   Filed Vitals:   08/16/12 0400 08/16/12 0500 08/16/12 0600 08/16/12 0700  BP: 114/74 152/81 139/76 132/95  Pulse:  51 68 64  Temp: 99.2 F (37.3 C)     TempSrc: Oral     Resp: 13 12 10 14   Height:      Weight:      SpO2:  100% 100% 99%    Physical Examination: Gen:  no acute distress HEENT: NCAT, PERRL, EOMi, OP clear, left facial droop Neck: Supple without masses PULM: Scattered rhonchi bilaterally CV: RRR, no mgr, no JVD AB: BS+, soft, nontender, no hsm Ext: Warm, no edema, no clubbing, no cyanosis Derm: No rash or skin breakdown Neuro: Awake and alert, answering questions, left facial droop, R side strength normal, left grip strength 3/5, shoulder and hip strength 2/5 Psyche: distressed  Labs and Imaging:   CBC    Component Value Date/Time   WBC 6.3 08/16/2012 0500   RBC 4.07* 08/16/2012 0500   HGB 12.5* 08/16/2012 0500   HCT 35.7* 08/16/2012 0500   PLT 205  08/16/2012 0500   MCV 87.7 08/16/2012 0500   MCH 30.7 08/16/2012 0500   MCHC 35.0 08/16/2012 0500   RDW 12.1 08/16/2012 0500   LYMPHSABS 1.9 08/14/2012 1224   MONOABS 0.4 08/14/2012 1224   EOSABS 0.1 08/14/2012 1224   BASOSABS 0.0 08/14/2012 1224   BMET    Component Value Date/Time   NA 138 08/16/2012 0500   K 3.3* 08/16/2012 0500   CL 103 08/16/2012 0500   CO2 25 08/16/2012 0500   GLUCOSE 114* 08/16/2012 0500   BUN 9 08/16/2012 0500   CREATININE 1.12 08/16/2012 0500   CALCIUM 9.2 08/16/2012 0500   GFRNONAA 69* 08/16/2012 0500   GFRAA 80* 08/16/2012 0500   ABG No results found for this basename: phart,  pco2,  pco2art,  po2,  po2art,  hco3,  tco2,  acidbasedef,  o2sat   Assessment and Plan:  61 y/o male with known CAD, DM and hypertension who presented with an acute onset ischaemic stroke with left hemiparalysis on 9/23.  PCCM consulted for medical management in ICU.  Currently protecting airway, vitals WNL, no infectious symptoms.  CVA (cerebral infarction) (08/14/2012)   Assessment: acute ischaemic   Plan:  -Heparin gtt and asa per neurology. -BP goals per neurology.  DIABETES MELLITUS, TYPE II, UNCONTROLLED (04/13/2010)  Assessment:    Plan:  -A1c = 9.0. -SSI for goal glucose < 180.  HYPERLIPIDEMIA-MIXED (03/07/2009)   Assessment:    Plan:  -Continue statin.  HYPERTENSION, BENIGN (03/12/2009)   Assessment:    Plan:  -Labetalol prn SBP > 200. -Maintain on atenolol 25 BID. -Add lisinopril patient is HTN and diabetic .  CAD, NATIVE VESSEL (03/12/2009)   Assessment:    Plan:  -Restart plavix when OK by ENT. -ASA given.  Recheck labs and monitor in the ICU overnight, likely to move out in AM. Swallow evaluation noted, diet ordered. PT/OT and OOB to chair. IS ordered.  Replace K.  Patient transferring out of the ICU, PCCM will sign off, please call back if needed.  Rush Farmer, M.D. [facility] Pulmonary/Critical Care Medicine. Pager: [phone]. After hours pager:  [phone].

## 2012-08-16 NOTE — Progress Notes (Signed)
Stroke Team Progress Note  HISTORY Josearmando Evatt is an 61 y.o. male with a history of TIA, diabetes mellitus, hyperlipidemia and hypertension, presenting with acute recurrent weakness and numbness involving his left side. He was last seen normal 08/14/2012 at 1000. He had a TIA with similar presentation in May 2013. Workup was unremarkable. Patient is on aspirin at that time and was switched to Plavix 75 mg per day. CT scan of his head today showed no acute intracranial abnormality. Left facial weakness as well as left upper extremity weakness improved after arriving in the emergency room. Numbness was persistent, mass was moderately severe weakness involving left lower extremity proximally. NIH stroke score initially was 6. Subsequent score was 5. He is status post laryngeal surgery less than one week ago. Patient was not a TPA candidate secondary to recent major surgery and resolving deficits. He was admitted for further evaluation and treatment.  SUBJECTIVE   OBJECTIVE Most recent Vital Signs: Filed Vitals:   08/16/12 0400 08/16/12 0500 08/16/12 0600 08/16/12 0700  BP: 114/74 152/81 139/76 132/95  Pulse:  51 68 64  Temp: 99.2 F (37.3 C)     TempSrc: Oral     Resp: 13 12 10 14   Height:      Weight:      SpO2:  100% 100% 99%   CBG (last 3)   Basename 08/16/12 0038 08/15/12 1927 08/15/12 1524  GLUCAP 164* 181* 129*   Intake/Output from previous day: 09/24 0701 - 09/25 0700 In: 2058.8 [P.O.:240; I.V.:1818.8] Out: 2080 [Urine:2080]  IV Fluid Intake:      . sodium chloride 75 mL/hr at 08/16/12 0700  . DISCONTD: heparin Stopped (08/15/12 0830)    MEDICATIONS     . atenolol  25 mg Oral Daily  . clopidogrel  75 mg Oral Daily  . enoxaparin (LOVENOX) injection  40 mg Subcutaneous Q24H  . feeding supplement  237 mL Oral Q1500  . fluticasone  2 spray Each Nare Daily  . hydrochlorothiazide  12.5 mg Oral Daily  . insulin aspart  0-15 Units Subcutaneous Q4H  . insulin aspart   2-4 Units Subcutaneous TID AC  . pantoprazole  40 mg Oral BID  . simvastatin  20 mg Oral q1800  . DISCONTD: aspirin  300 mg Rectal Daily  . DISCONTD: aspirin  300 mg Rectal Daily  . DISCONTD: aspirin  300 mg Rectal Daily  . DISCONTD: lisinopril  40 mg Oral Daily   PRN:  labetalol, ondansetron (ZOFRAN) IV, senna-docusate  Diet:  Dysphagia 2 thin liquid carb modified Activity:  Bedrest DVT Prophylaxis:  Lovenox 40 mg sq daily   CLINICALLY SIGNIFICANT STUDIES Basic Metabolic Panel:   Lab [id number] 08/14/12 1224  NA 138 138  K 3.3* 4.1  CL 103 100  CO2 25 30  GLUCOSE 114* 335*  BUN 9 10  CREATININE 1.12 1.04  CALCIUM 9.2 9.4  MG 2.1 --  PHOS 3.6 --   Liver Function Tests:   Lab 08/14/12 1224 08/14/12 0900  AST 19 22  ALT 20 23  ALKPHOS 67 59  BILITOT 0.6 1.0  PROT 7.5 7.5  ALBUMIN 3.8 4.0   CBC:   Lab 08/16/12 0500 08/15/12 0025 08/14/12 1224  WBC 6.3 6.4 --  NEUTROABS -- -- 2.9  HGB 12.5* 11.8* --  HCT 35.7* 33.6* --  MCV 87.7 88.4 --  PLT 205 189 --   Coagulation:   Lab 08/14/12 1224  LABPROT 13.7  INR 1.06   Cardiac Enzymes:  Lab 08/14/12 1225  CKTOTAL --  CKMB --  CKMBINDEX --  TROPONINI <0.30   Urinalysis: No results found for this basename: COLORURINE:2,APPERANCEUR:2,LABSPEC:2,PHURINE:2,GLUCOSEU:2,HGBUR:2,BILIRUBINUR:2,KETONESUR:2,PROTEINUR:2,UROBILINOGEN:2,NITRITE:2,LEUKOCYTESUR:2 in the last 168 hours Lipid Panel    Component Value Date/Time   CHOL 90 08/15/2012 1008   TRIG 59 08/15/2012 1008   HDL 52 08/15/2012 1008   CHOLHDL 1.7 08/15/2012 1008   VLDL 12 08/15/2012 1008   LDLCALC 26 08/15/2012 1008   HgbA1C  Lab Results  Component Value Date   HGBA1C 9.4* 08/15/2012    Urine Drug Screen:     Component Value Date/Time   LABOPIA POSITIVE* 08/14/2012 1316   COCAINSCRNUR NONE DETECTED 08/14/2012 1316   LABBENZ NONE DETECTED 08/14/2012 1316   AMPHETMU NONE DETECTED 08/14/2012 1316   THCU NONE DETECTED 08/14/2012 1316   LABBARB NONE  DETECTED 08/14/2012 1316    Alcohol Level: No results found for this basename: ETH:2 in the last 168 hours  CT of the brain   08/14/2012   No acute intracranial abnormality.   08/14/2012  No acute intracranial abnormality.  Atrophy, chronic microvascular disease.    CT Angio Neck  08/14/2012   No extracranial vascular abnormalities detected.  No flow limiting stenosis or dissection.  No change from prior normal spine.      CT Angio Head  08/14/2012   Unremarkable CT angiography of the intracranial circulation.  No change from prior normal study.    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  Prominent small vessel disease type changes.  Global atrophy without hydrocephalus.  Paranasal sinus disease most notable right maxillary sinus   MRA of the brain  08/16/2012  Intracranial atherosclerotic type changes as noted above with ectasia of medium and large size vessels and branch vessel irregularity.  2D Echocardiogram  EF 50-55% with no source of embolus.   Carotid Doppler  Bilateral: No evidence of hemodynamically significant internal carotid artery stenosis. Vertebral artery flow is antegrade.   CXR  08/14/2012   No acute cardiopulmonary disease.     EKG  unchanged from previous tracings, normal sinus rhythm.   Therapy Recommendations PT - ; OT - ; ST -   Physical Exam   Pleasant middle-aged [place] American male currently not in distress.Awake alert. Afebrile. Head is nontraumatic. Neck is supple without bruit. Hearing is normal. Cardiac exam no murmur or gallop. Lungs are clear to auscultation. Distal pulses are well felt.  Neurological Exam : awake alert oriented to time place and person. Severe dysarthria but can be understood. No aphasia. Extraocular moments are full range though there is slight a right gaze preference. He blinks to threat bilaterally. Fundi were not visualized. Vision acuity appears normal. There is  moderate left lower facial weakness. Tongue is midline. Cough and gag a week. He has been slept hemiplegia with grade 2/5 strength in the left upper extremity and 1/5 strength in the left lower extremity. Sensation is impaired on the left side. Coordination and gait could not be tested. Left plantar is upgoing. Right is downgoing. NIH stroke scale is 41  ASSESSMENT Mr. Tyr Pearce is a 61 y.o. male presenting with weakness and numbness involving his left side. MRI confirms a right thalamic/posterior limb right internal  capsule infarct. Infarct secondary to small vessel disease. On clopidogrel 75 mg orally every day prior to admission. Now on clopidogrel 75 mg orally every day for secondary stroke prevention. Patient with resultant left hemiparesis, left hemisensory deficit, .  CAD - stent 2001, 2004; angioplasty 2005  Diabetes, HgbA1c 9.4  Hyperlipidemia, LDL 106, on statin PTA Hypertension. Resumed home meds. Pt states lisinopril was stopped during last admission due to cough. TIA 02/2012, MRI negative for acute stroke   Laryngoplasty 08/07/2012  Hypokalemia, K 3.3  Hospital day # 2  TREATMENT/PLAN  OOB. Therapy evals. Rehab consult.  Transfer to the floor.   Replace potassium. BMET in am Consider ACCELERATE trial, use of Cholesteryl Ester Transfer Protein (CETP) Inhibitor: Potential of Evacetrapib to treat atherosclerosis and CAD. [facility] will contact patient with information about trial, screen for inclusion. D/W wife at bedside and Dr Lattie Haw, MSN, RN, ANVP-BC, ANP-BC, GNP-BC [facility] Pager: [phone] 08/16/2012 8:16 AM  Scribe for Dr. Antony Contras, Ernest Director, who has personally reviewed chart, pertinent data, examined the patient and developed the plan of care. Pager:  [phone]

## 2012-08-16 NOTE — Consult Note (Signed)
Physical Medicine and Rehabilitation Consult Reason for Consult: left sided weakness and facial weakness Referring Physician: Dr. Leonie Man   HPI: [name] is a 61 y.o. RH-male with a history of TIA, diabetes mellitus, hyperlipidemia, recent laryngoplasty 9/16 who presented with acute recurrent weakness and numbness involving his left side with facial weakness on 08/14/12 had a TIA with similar presentation in May 2013. Workup was unremarkable and patient placed on plavix for CVA prophylaxis. CT head negative. 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  2D echo done revealing EF 50-55% with grade 1 diastolic dysfunction. Carotid dopplers without ICA stenosis.   Patient had worsening of symptoms past admission. 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  FEES done (patient aspirating before) and placed on D2, thin liquids with hard swallow. Has been on bed rest but PT/OT evaluations to be done today. MD, ST recommending CIR.    Review of Systems  Eyes: Negative for blurred vision and double vision.  Respiratory: Positive for cough (chronic). Negative for wheezing.   Cardiovascular: Negative for chest pain and palpitations.  Gastrointestinal: Positive for nausea and constipation (chronic). Negative for abdominal pain.  Genitourinary: Negative for urgency and frequency.  Musculoskeletal: Positive for back pain (chronic due to injury with numbness Left knee down.) and joint pain (chronic eft knee pain).  Neurological: Positive for sensory change, speech change, focal weakness and headaches (frontal/orbital  headaches).   Past Medical History  Diagnosis Date  . ALLERGIC RHINITIS   . CAD, NATIVE VESSEL     BMS to OM1 2001, DES to BMS 2005  . DIAB W/UNSPEC COMP TYPE II/UNSPEC TYPE UNCNTRL   . ERECTILE DYSFUNCTION   . HYPERLIPIDEMIA-MIXED     takes  Lipitor daily  . MORTON'S NEUROMA, RIGHT   . SHOULDER PAIN, RIGHT   . PONV (postoperative nausea and vomiting)   . HYPERTENSION, BENIGN     takes Lisinopril and Atenolol daily  . Coughing   . Shortness of breath     lying/sitting/exertion  . Headache     occasionally  . Peripheral neuropathy   . Arthritis   . Joint pain   . Joint swelling   . Chronic back pain     herniated disc  . GERD     takes Protonix daily  . Constipation     takes Carafate four times day  . Seasonal allergies     takes Allegra and Benadryl daily prn;uses Flonase daily  . Diabetes mellitus     on insulin  . Insomnia     doesn't require meds  . TIA on medication 02/2012    takes Plavix daily;on hold for surgery;rt sided weakness   Past Surgical History  Procedure Date  . Stent 2001, 2004    coronary stents  . Angioplasty   . Left knee surgury     x 2   . Dental surgery   . Coronary angioplasty 2005    2 stents  . Colonoscopy   . Laryngoplasty 08/07/2012    Procedure: LARYNGOPLASTY;  Surgeon: Izora Gala, MD;  Location: [facility] OR;  Service: ENT;  Laterality: Left;  Left Vocal Cord Medialyzation   Family History  Problem Relation Age of Onset  . Stroke     Social History: Married. 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 Was attending school at [employer]. He reports that he quit smoking about 29 years ago. He has  never used smokeless tobacco. He reports that he does not drink alcohol or use illicit drugs.   Allergies  Allergen Reactions  . Penicillins Anaphylaxis  . Pneumococcal Vaccines Anaphylaxis    Pt states allergic to it.  . Shellfish Allergy Anaphylaxis    Medications Prior to Admission  Medication Sig Dispense Refill  . atenolol (TENORMIN) 25 MG tablet Take 25 mg by mouth daily.      . clopidogrel (PLAVIX) 75 MG tablet Take 75 mg by mouth daily.      . diphenhydrAMINE (BENADRYL) 25 MG tablet Take 25 mg by mouth every 6 (six) hours as needed. For allergies      . fexofenadine  (ALLEGRA) 180 MG tablet Take 180 mg by mouth daily as needed. For allergies      . fluticasone (FLONASE) 50 MCG/ACT nasal spray Place 2 sprays into the nose daily.  16 g  2  . hydrochlorothiazide (MICROZIDE) 12.5 MG capsule Take 1 capsule (12.5 mg total) by mouth daily.  30 capsule  2  . HYDROcodone-acetaminophen (LORTAB 7.5) 7.5-500 MG per tablet Take 1 tablet by mouth every 6 (six) hours as needed for pain.  30 tablet  0  . insulin aspart (NOVOLOG) 100 UNIT/ML injection Inject 2-4 Units into the skin 3 (three) times daily before meals. 4 units in the morning and at night, and 2 units at lunch.      . lisinopril (PRINIVIL,ZESTRIL) 40 MG tablet Take 1 tablet (40 mg total) by mouth daily.  30 tablet  4  . nitroGLYCERIN (NITROSTAT) 0.4 MG SL tablet Place 0.4 mg under the tongue every 5 (five) minutes as needed. For chest pain      . pantoprazole (PROTONIX) 40 MG tablet Take 1 tablet (40 mg total) by mouth 2 (two) times daily.  60 tablet  3  . pravastatin (PRAVACHOL) 40 MG tablet Take 40 mg by mouth at bedtime.       . promethazine (PHENERGAN) 25 MG suppository Place 1 suppository (25 mg total) rectally every 6 (six) hours as needed for nausea.  12 each  0  . sucralfate (CARAFATE) 1 GM/10ML suspension Take 10 mLs (1 g total) by mouth 4 (four) times daily.  420 mL  0    Home:    Functional History:   Functional Status:  Mobility:          ADL:    Cognition: Cognition Orientation Level: Oriented X4    Blood pressure 132/95, pulse 64, temperature 99.2 F (37.3 C), temperature source Oral, resp. rate 14, height 5\' 11"  (1.803 m), weight 88.6 kg (195 lb 5.2 oz), SpO2 99.00%. Physical Exam  Nursing note and vitals reviewed. Constitutional: He is oriented to person, place, and time. He appears well-developed and well-nourished.  HENT:  Head: Normocephalic.  Eyes: Pupils are equal, round, and reactive to light.  Cardiovascular: Normal rate and regular rhythm.   Pulmonary/Chest: Effort  normal and breath sounds normal.  Abdominal: Soft. Bowel sounds are normal. He exhibits no distension.  Musculoskeletal: He exhibits no edema and no tenderness.  Neurological: He is alert and oriented to person, place, and time.       Left facial weakness. Mild dysarthria with dysphonia and intermittent cough noted. Mild left inattention with tendency to slump to the right.  Left hemiparesis UE>LE. Left hemisensory deficits from face to toe. LUE is grossly 3/5, he is 2-3/5 LLE. Cognitively quite appropriate   Skin: Skin is warm and dry.    Results for orders placed  during the hospital encounter of 08/14/12 (from the past 24 hour(s))  LIPID PANEL     Status: Normal   Collection Time   08/15/12 10:08 AM      Component Value Range   Cholesterol 90  0 - 200 mg/dL   Triglycerides 59  <150 mg/dL   HDL 52  >39 mg/dL   Total CHOL/HDL Ratio 1.7     VLDL 12  0 - 40 mg/dL   LDL Cholesterol 26  0 - 99 mg/dL  APTT     Status: Abnormal   Collection Time   08/15/12 10:08 AM      Component Value Range   aPTT 52 (*) 24 - 37 seconds  GLUCOSE, CAPILLARY     Status: Abnormal   Collection Time   08/15/12 12:20 PM      Component Value Range   Glucose-Capillary 141 (*) 70 - 99 mg/dL  GLUCOSE, CAPILLARY     Status: Abnormal   Collection Time   08/15/12  3:24 PM      Component Value Range   Glucose-Capillary 129 (*) 70 - 99 mg/dL   Comment 1 Notify RN     Comment 2 Documented in Chart    GLUCOSE, CAPILLARY     Status: Abnormal   Collection Time   08/15/12  7:27 PM      Component Value Range   Glucose-Capillary 181 (*) 70 - 99 mg/dL   Comment 1 Notify RN     Comment 2 Documented in Chart    GLUCOSE, CAPILLARY     Status: Abnormal   Collection Time   08/16/12 12:38 AM      Component Value Range   Glucose-Capillary 164 (*) 70 - 99 mg/dL  GLUCOSE, CAPILLARY     Status: Abnormal   Collection Time   08/16/12  4:08 AM      Component Value Range   Glucose-Capillary 100 (*) 70 - 99 mg/dL  CBC     Status:  Abnormal   Collection Time   08/16/12  5:00 AM      Component Value Range   WBC 6.3  4.0 - 10.5 K/uL   RBC 4.07 (*) 4.22 - 5.81 MIL/uL   Hemoglobin 12.5 (*) 13.0 - 17.0 g/dL   HCT 35.7 (*) 39.0 - 52.0 %   MCV 87.7  78.0 - 100.0 fL   MCH 30.7  26.0 - 34.0 pg   MCHC 35.0  30.0 - 36.0 g/dL   RDW 12.1  11.5 - 15.5 %   Platelets 205  150 - 400 K/uL  BASIC METABOLIC PANEL     Status: Abnormal   Collection Time   08/16/12  5:00 AM      Component Value Range   Sodium 138  135 - 145 mEq/L   Potassium 3.3 (*) 3.5 - 5.1 mEq/L   Chloride 103  96 - 112 mEq/L   CO2 25  19 - 32 mEq/L   Glucose, Bld 114 (*) 70 - 99 mg/dL   BUN 9  6 - 23 mg/dL   Creatinine, Ser 1.12  0.50 - 1.35 mg/dL   Calcium 9.2  8.4 - 10.5 mg/dL   GFR calc non Af Amer 69 (*) >90 mL/min   GFR calc Af Amer 80 (*) >90 mL/min  MAGNESIUM     Status: Normal   Collection Time   08/16/12  5:00 AM      Component Value Range   Magnesium 2.1  1.5 - 2.5 mg/dL  PHOSPHORUS     Status: Normal   Collection Time   08/16/12  5:00 AM      Component Value Range   Phosphorus 3.6  2.3 - 4.6 mg/dL  GLUCOSE, CAPILLARY     Status: Abnormal   Collection Time   08/16/12  8:04 AM      Component Value Range   Glucose-Capillary 149 (*) 70 - 99 mg/dL   Ct Angio Head W/cm &/or Wo Cm  08/14/2012  *RADIOLOGY REPORT*  Clinical Data:  Left-sided weakness, now resolving.  CT ANGIOGRAPHY HEAD AND NECK  Technique:  Multidetector CT imaging of the head and neck was performed using the standard protocol during bolus administration of intravenous contrast.  Multiplanar CT image reconstructions including MIPs were obtained to evaluate the vascular anatomy. Carotid stenosis measurements (when applicable) are obtained utilizing NASCET criteria, using the distal internal carotid diameter as the denominator.  Contrast: 96mL OMNIPAQUE IOHEXOL 350 MG/ML SOLN  Comparison:  MRI brain, MRI extracranial and intracranial 04/07/2012.  CT head earlier today.  CTA NECK  Findings:   Conventional branching of the great vessels from the arch without proximal stenosis.  Widely patent carotid bifurcations.  Right vertebral dominant.  No loss of stenosis.  No neck masses.  Mild spondylosis.   Review of the MIP images confirms the above findings.  IMPRESSION: No extracranial vascular abnormalities detected.  No flow limiting stenosis or dissection.  No change from prior normal spine.  CTA HEAD  Findings:  There is no evidence for acute infarction, intracranial hemorrhage, mass lesion, hydrocephalus, or extra-axial fluid. Slight atrophy with chronic microvascular ischemic changes stable. Post infusion, no abnormal intracranial enhancement.  Chronic right maxillary sinus disease.  The internal carotid arteries are widely patent.  Both anterior cerebral arteries fill from the left due to a hypoplastic A1-ACA on the right.  No MCA stenosis or occlusion.  The basilar arteries widely patent with the right vertebral dominant.  No proximal PCA stenosis and no cerebellar branch occlusion.  Moderate dolichoectasia throughout without visible aneurysm.  Calvarium intact.  No mastoid disease.   Review of the MIP images confirms the above findings.  IMPRESSION: Unremarkable CT angiography of the intracranial circulation.  No change from prior normal study.   Original Report Authenticated By: Staci Righter, M.D.    Ct Head Wo Contrast  08/14/2012  *RADIOLOGY REPORT*  Clinical Data: Weakness.  CT HEAD WITHOUT CONTRAST  Technique:  Contiguous axial images were obtained from the base of the skull through the vertex without contrast.  Comparison: 08/14/2012  Findings: No evidence for acute hemorrhage, mass lesion, midline shift hydrocephalus or large infarct.  No acute bony abnormality.  IMPRESSION: No acute intracranial abnormality.   Original Report Authenticated By: Markus Daft, M.D.    Ct Head Wo Contrast  08/14/2012  *RADIOLOGY REPORT*  Clinical Data: Code stroke.  Left-sided weakness.  CT HEAD WITHOUT  CONTRAST  Technique:  Contiguous axial images were obtained from the base of the skull through the vertex without contrast.  Comparison: 04/07/2012  Findings: There is atrophy and chronic small vessel disease changes. No acute intracranial abnormality.  Specifically, no hemorrhage, hydrocephalus, mass lesion, acute infarction, or significant intracranial injury.  No acute calvarial abnormality. Visualized paranasal sinuses and mastoids clear.  Orbital soft tissues unremarkable.  IMPRESSION: No acute intracranial abnormality.  Atrophy, chronic microvascular disease.  Critical Value/emergent results were called by telephone at the time of interpretation on 08/14/2012 at 12:46 p.m. to Dr. Nicole Kindred, who verbally acknowledged these results.  Original Report Authenticated By: Raelyn Number, M.D.    Ct Angio Neck W/cm &/or Wo/cm  08/14/2012  *  IMPRESSION: No extracranial vascular abnormalities detected.  No flow limiting stenosis or dissection.  No change from prior normal spine.  CTA HEAD    IMPRESSION: Unremarkable CT angiography of the intracranial circulation.  No change from prior normal study.   Original Report Authenticated By: Staci Righter, M.D.    Dg Chest Port 1 View  08/14/2012  *RADIOLOGY REPORT*  Clinical Data: Stroke.  PORTABLE CHEST - 1 VIEW  Comparison: 07/05/2012.  Findings: Cardiopericardial silhouette upper limits of normal for projection.  Question coronary artery stent or coronary artery calcification.  Lungs are clear. Monitoring leads are projected over the chest.  IMPRESSION: No acute cardiopulmonary disease.   Original Report Authenticated By: Dereck Ligas, M.D.    Mr Mra Head/brain Wo Cm  08/16/2012  *RADIOLOGY REPORT*  Clinical Data:  Left-sided weakness.  MRI BRAIN WITHOUT CONTRAST MRA HEAD WITHOUT CONTRAST  Technique: Multiplanar, multiecho pulse sequences of the brain and surrounding structures were obtained according to standard protocol without intravenous contrast.   Angiographic images of the head were obtained using MRA technique without contrast.  Comparison: 08/14/2012 head CT.  04/07/2012 brain MR.  MRI HEAD  Findings:  Acute non hemorrhagic right thalamic/posterior limb right internal capsule infarct.  No intracranial hemorrhage.  Prominent small vessel disease type changes.  Global atrophy without hydrocephalus.  No intracranial mass lesion detected on this unenhanced exam.  Cervical spine bone marrow altered signal intensity may be related to patient's habitus.  Anemia not excluded. Appearance without change.  Exophthalmos.  Minimal to mild mucosal thickening frontal sinuses and ethmoid sinus air cells.  Polypoid opacification anterior aspect left maxillary sinus.  Complete opacification right maxillary sinus with inspissated material centrally.  Fungal disease not excluded.  IMPRESSION: Acute non hemorrhagic right thalamic/posterior limb right internal capsule infarct.  Prominent small vessel disease type changes.  Global atrophy without hydrocephalus.  Paranasal sinus disease most notable right maxillary sinus as discussed above.  MRA HEAD  Findings: Ectatic distal vertical cervical segment, petrous segment and cavernous/supraclinoid segment of the left internal carotid artery.  Aplastic A1 segment right anterior cerebral artery.  Middle cerebral artery and A2 segment anterior cerebral artery branch vessel irregularity bilaterally.  Left vertebral artery ends in a PICA distribution.  Mild to moderate irregularity of the left PICA.  Ectatic right vertebral artery and basilar artery.  Mild to slightly moderate narrowing mid aspect of the basilar artery.  Nonvisualization AICAs.  Superior cerebellar artery and posterior cerebral artery mild branch vessel irregularity.  No aneurysm or vascular malformation noted.  IMPRESSION: Intracranial atherosclerotic type changes as noted above with ectasia of medium and large size vessels and branch vessel irregularity.  Preliminary  report at the time imaging by Dr. Carlis Abbott.   Original Report Authenticated By: Doug Sou, M.D.     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 Does the need for close, 24 hr/day medical supervision in concert with the patient's rehab needs make it unreasonable for this patient to be served in a less intensive setting? Yes 2. Co-Morbidities requiring supervision/potential complications: dm, htn, cad 3. Due to bladder management, bowel management, safety, skin/wound care, disease management, medication administration, pain management and patient education, does the patient require 24 hr/day rehab nursing? Yes 4. Does the patient require coordinated care of a physician, rehab nurse, PT (1-2 hrs/day, 5 days/week), OT (1-2 hrs/day, 5 days/week)  and SLP (1-2 hrs/day, 5 days/week) to address physical and functional deficits in the context of the above medical diagnosis(es)? Yes Addressing deficits in the following areas: balance, endurance, locomotion, strength, transferring, bowel/bladder control, bathing, dressing, feeding, grooming, toileting, speech, language, swallowing and psychosocial support 5. Can the patient actively participate in an intensive therapy program of at least 3 hrs of therapy per day at least 5 days per week? Yes 6. The potential for patient to make measurable gains while on inpatient rehab is excellent 7. Anticipated functional outcomes upon discharge from inpatient rehab are supervision to min assist with PT, supervision to min assist with OT, mod I with SLP. 8. Estimated rehab length of stay to reach the above functional goals is: 10-14 days 9. Does the patient have adequate social supports to accommodate these discharge functional goals? Yes 10. Anticipated D/C setting: Home 11. Anticipated post D/C treatments: [place] therapy 12. Overall Rehab/Functional Prognosis: excellent  RECOMMENDATIONS: This patient's condition is appropriate for continued  rehabilitative care in the following setting: CIR Patient has agreed to participate in recommended program. Yes Note that insurance prior authorization may be required for reimbursement for recommended care.  Comment: Rehab RN to follow up.   Oval Linsey, MD      08/16/2012

## 2012-08-16 NOTE — Evaluation (Signed)
Physical Therapy Evaluation Patient Details Name: [name] MRN: [id number] DOB: 11/24/1950 Today's Date: 08/16/2012 Time: [id number] PT Time Calculation (min): 34 min  PT Assessment / Plan / Recommendation Clinical Impression  Pt is a 61 y/o male admitted s/p right brain CVA along with the below PT problem list.  Pt would benefit from acute PT to maximize independence and facilitate d/c to CIR.    PT Assessment  Patient needs continued PT services    Follow Up Recommendations  Inpatient Rehab    Barriers to Discharge None      Equipment Recommendations  Rolling walker with 5" wheels;Wheelchair (measurements);Wheelchair cushion (measurements)    Recommendations for Other Services Rehab consult   Frequency Min 4X/week    Precautions / Restrictions Precautions Precautions: Fall Restrictions Weight Bearing Restrictions: No   Pertinent Vitals/Pain None      Mobility  Bed Mobility Bed Mobility: Not assessed Transfers Transfers: Sit to Stand;Stand to Sit (2 trials.) Sit to Stand: 3: Mod assist;With upper extremity assist;From chair/3-in-1 Stand to Sit: 3: Mod assist;With upper extremity assist;To chair/3-in-1 Details for Transfer Assistance: Assist to translate trunk anterior over BOS with pt leaning right over right LE in order to compensate for decreased left LE strength.  Blockied left knee to prevent buckling/hyperextension throughout.  Assist to slow descent to chair.  Cues for sequence and safety. Ambulation/Gait Ambulation/Gait Assistance: Not tested (comment) Stairs: No Wheelchair Mobility Wheelchair Mobility: No Modified Rankin (Stroke Patients Only) Pre-Morbid Rankin Score: No symptoms Modified Rankin: Severe disability    Shoulder Instructions     Exercises     PT Diagnosis: Hemiplegia non-dominant side  PT Problem List: Decreased strength;Decreased activity tolerance;Decreased balance;Decreased mobility;Decreased knowledge of use of DME;Decreased  coordination;Impaired sensation PT Treatment Interventions: DME instruction;Gait training;Functional mobility training;Therapeutic activities;Balance training;Neuromuscular re-education;Patient/family education   PT Goals Acute Rehab PT Goals PT Goal Formulation: With patient/family Time For Goal Achievement: 08/30/12 Potential to Achieve Goals: Good Pt will go Supine/Side to Sit: with min assist PT Goal: Supine/Side to Sit - Progress: Goal set today Pt will go Sit to Supine/Side: with min assist PT Goal: Sit to Supine/Side - Progress: Goal set today Pt will go Sit to Stand: with min assist PT Goal: Sit to Stand - Progress: Goal set today Pt will go Stand to Sit: with min assist PT Goal: Stand to Sit - Progress: Goal set today Pt will Transfer Bed to Chair/Chair to Bed: with mod assist PT Transfer Goal: Bed to Chair/Chair to Bed - Progress: Goal set today Pt will Ambulate: 1 - 15 feet;with mod assist;with least restrictive assistive device PT Goal: Ambulate - Progress: Goal set today  Visit Information  Last PT Received On: 08/16/12 Assistance Needed: +2    Subjective Data  Subjective: "I'm sick of sitting.  This is perfect." Patient Stated Goal: Get well.  Move around.   Prior Functioning  Home Living Lives With: Spouse;Daughter;Other (Comment) (Wife's sister and wife's brother-in-law) Available Help at Discharge: Family;Available 24 hours/day Type of Home: House Home Access: Stairs to enter [employer] of Steps: 1 Entrance Stairs-Rails: None Home Layout: Two level;Other (Comment) (Has elevator to second floor.) Bathroom Shower/Tub: Walk-in shower;Door [employer] Toilet: Handicapped height Bathroom Accessibility: Yes How Accessible: Accessible via wheelchair;Accessible via walker Home Adaptive Equipment: None Prior Function Level of Independence: Independent Able to Take Stairs?: Yes Driving: Yes Vocation: Retired Corporate investment banker: No  difficulties Dominant Hand: Right    Cognition  Overall Cognitive Status: Appears within functional limits for tasks assessed/performed Arousal/Alertness: Awake/alert Orientation  Level: Appears intact for tasks assessed Behavior During Session: [facility] for tasks performed    Extremity/Trunk Assessment Right Upper Extremity Assessment RUE ROM/Strength/Tone: Within functional levels RUE Sensation: WFL - Light Touch RUE Coordination: WFL - gross/fine motor Left Upper Extremity Assessment LUE ROM/Strength/Tone: Deficits LUE ROM/Strength/Tone Deficits: 2/5 grossly. LUE Sensation: Deficits LUE Sensation Deficits: Numb left UE. Right Lower Extremity Assessment RLE ROM/Strength/Tone: Within functional levels RLE Sensation: WFL - Light Touch RLE Coordination: WFL - gross/fine motor Left Lower Extremity Assessment LLE ROM/Strength/Tone: Deficits LLE ROM/Strength/Tone Deficits: 2/5 grossly throughout. LLE Sensation: Deficits LLE Sensation Deficits: Numb/tingling throughout. Trunk Assessment Trunk Assessment: Normal   Balance Balance Balance Assessed: Yes Static Sitting Balance Static Sitting - Balance Support: Right upper extremity supported;Feet supported Static Sitting - Level of Assistance: 4: Min assist Static Sitting - Comment/# of Minutes: Min assist to maintain midline upright trunk over BOS with tendency to lean right compensating for left weakness.  Cues for correct positioning.  End of Session PT - End of Session Equipment Utilized During Treatment: Gait belt Activity Tolerance: Patient tolerated treatment well Patient left: in chair;with call bell/phone within reach;with family/visitor present Nurse Communication: Mobility status  GP     Cyndia Bent 08/16/2012, 1:50 PM  08/16/2012 Cyndia Bent, PT, DPT [phone]

## 2012-08-16 NOTE — Progress Notes (Signed)
Speech Language Pathology Dysphagia Treatment Patient Details Name: [name] MRN: [id number] DOB: 29-Aug-1951 Today's Date: 08/16/2012 Time: [id number] SLP Time Calculation (min): 8 min  Assessment / Plan / Recommendation Clinical Impression  Treatment focused on patient and spouse education as patient with c/o nausea and politely declined pos. vocal quality slightly clearer today, became increasingly wet with spontaneous throat clearing to clear as treatment progressed. Continue to suspect pharyngeal impacts of both recent laryngeal surgery and acute CVA. Patient able to verbalize aspiration precautions independently. SLP will continue to f/u to ensure use during po intake, determine potential to advance diet, and overall diet tolerance.     Diet Recommendation  Continue with Current Diet: Dysphagia 2 (fine chop);Thin liquid    SLP Plan Continue with current plan of care   Pertinent Vitals/Pain n/a   Swallowing Goals  SLP Swallowing Goals Patient will utilize recommended strategies during swallow to increase swallowing safety with: Modified independent assistance Swallow Study Goal #2 - Progress: Progressing toward goal  General Temperature Spikes Noted: No Respiratory Status: Room air Behavior/Cognition: Alert;Cooperative (nauseous, RN aware) Oral Cavity - Dentition: Adequate natural dentition Patient Positioning: Upright in chair     Dysphagia Treatment Treatment focused on: Patient/family/caregiver education Family/Caregiver Educated: spouse Patient observed directly with PO's: No Reason PO's not observed: Other (comment) (nausea, RN aware)   [place], [place] [phone]   Gabriel Rainwater Meryl 08/16/2012, 3:41 PM

## 2012-08-16 NOTE — Progress Notes (Signed)
OT Cancellation Note  Treatment cancelled today due to medical issues with patient which prohibited therapy (bedrest).  Sharol Harness Vaughn Pager: [phone]  08/16/2012, 7:25 AM

## 2012-08-17 DIAGNOSIS — I6350 Cerebral infarction due to unspecified occlusion or stenosis of unspecified cerebral artery: Secondary | ICD-10-CM

## 2012-08-17 LAB — BASIC METABOLIC PANEL
BUN: 11 mg/dL (ref 6–23)
CO2: 26 mEq/L (ref 19–32)
Calcium: 9.2 mg/dL (ref 8.4–10.5)
Chloride: 103 mEq/L (ref 96–112)
Creatinine, Ser: 1.07 mg/dL (ref 0.50–1.35)
GFR calc Af Amer: 84 mL/min — ABNORMAL LOW (ref >90)
GFR calc non Af Amer: 72 mL/min — ABNORMAL LOW (ref >90)
Glucose, Bld: 182 mg/dL — ABNORMAL HIGH (ref 70–99)
Potassium: 3.7 mEq/L (ref 3.5–5.1)
Sodium: 138 mEq/L (ref 135–145)

## 2012-08-17 LAB — CBC
HCT: 34.9 % — ABNORMAL LOW (ref 39.0–52.0)
Hemoglobin: 12.1 g/dL — ABNORMAL LOW (ref 13.0–17.0)
MCH: 30.5 pg (ref 26.0–34.0)
MCHC: 34.7 g/dL (ref 30.0–36.0)
MCV: 87.9 fL (ref 78.0–100.0)
Platelets: 204 10*3/uL (ref 150–400)
RBC: 3.97 MIL/uL — ABNORMAL LOW (ref 4.22–5.81)
RDW: 12.3 % (ref 11.5–15.5)
WBC: 6.1 10*3/uL (ref 4.0–10.5)

## 2012-08-17 LAB — TROPONIN I
Troponin I: <0.3 ng/mL (ref <0.30)
Troponin I: <0.3 ng/mL (ref <0.30)
Troponin I: <0.3 ng/mL (ref <0.30)

## 2012-08-17 LAB — GLUCOSE, CAPILLARY
Glucose-Capillary: 171 mg/dL — ABNORMAL HIGH (ref 70–99)
Glucose-Capillary: 232 mg/dL — ABNORMAL HIGH (ref 70–99)
Glucose-Capillary: 151 mg/dL — ABNORMAL HIGH (ref 70–99)

## 2012-08-17 MED ORDER — SIMVASTATIN 10 MG PO TABS
10.0000 mg | ORAL_TABLET | Freq: Every day | ORAL | Status: DC
  Administered 2012-08-17: 10 mg via ORAL
  Filled 2012-08-17 (×2): qty 1

## 2012-08-17 MED ORDER — GABAPENTIN 300 MG PO CAPS: 300.0000 mg | ORAL_CAPSULE | Freq: Three times a day (TID) | ORAL | Status: DC

## 2012-08-17 MED ORDER — TRAMADOL HCL 50 MG PO TABS
50.0000 mg | ORAL_TABLET | Freq: Four times a day (QID) | ORAL | Status: DC | PRN
  Administered 2012-08-17: 50 mg via ORAL
  Filled 2012-08-17: qty 1

## 2012-08-17 MED ORDER — GABAPENTIN 100 MG PO CAPS
100.0000 mg | ORAL_CAPSULE | Freq: Three times a day (TID) | ORAL | Status: DC
  Administered 2012-08-17 – 2012-08-18 (×3): 100 mg via ORAL
  Filled 2012-08-17 (×6): qty 1

## 2012-08-17 NOTE — Progress Notes (Signed)
Speech Language Pathology Dysphagia Treatment Patient Details Name: [name] MRN: [id number] DOB: Apr 06, 1951 Today's Date: 08/17/2012 Time: [id number] SLP Time Calculation (min): 15 min  Assessment / Plan / Recommendation Clinical Impression  Diet tolerance assessment revealed what appears to be improving oropharyngeal swallowing function. Patient continues to present with multiple swallows with clinician provided po trials for differential diagnosis however without overt s/s of aspiration. Min verbal reminders provided to continue use of consistent throat clear/hard cough post swallow regardless of seemingly improving function as risk of silent aspiration and/or aspiration of residuals increased with acute CVA. Patient anxious to resume regular texture solids, wife reporting that she feels more comfortable with chopped. Educated patient on rationale for chopped solids at this time. As s/s of clinical dysphagia decrease at bedside, SLP will consider trials of soft and/or regular texture solids. Need for repeat MBS to be determined pending improved function at bedside. Patient verbalized understanding. Will f/u. Also observed RN providing pill whole in puree which patient appeared to tolerate however recommended continuation of meds crushed for more conservative approach.      Diet Recommendation  Continue with Current Diet: Dysphagia 2 (fine chop);Thin liquid    SLP Plan Continue with current plan of care   Pertinent Vitals/Pain n/a   Swallowing Goals  SLP Swallowing Goals Patient will utilize recommended strategies during swallow to increase swallowing safety with: Modified independent assistance Swallow Study Goal #2 - Progress: Progressing toward goal  General Temperature Spikes Noted: No Respiratory Status: Room air Behavior/Cognition: Alert;Cooperative Oral Cavity - Dentition: Adequate natural dentition Patient Positioning: Upright in bed  Oral Cavity - Oral Hygiene      Dysphagia Treatment Treatment focused on: Skilled observation of diet tolerance;Utilization of compensatory strategies;Patient/family/caregiver education Family/Caregiver Educated: spouse Treatment Methods/Modalities: Skilled observation;Differential diagnosis Patient observed directly with PO's: Yes Type of PO's observed: Dysphagia 1 (puree);Thin liquids Feeding: Able to feed self Liquids provided via: Cup Pharyngeal Phase Signs & Symptoms: Multiple swallows Type of cueing: Verbal Amount of cueing: Minimal   GO   Gabriel Rainwater MA, CCC-SLP [phone]   Kalicia Dufresne Meryl 08/17/2012, 4:33 PM

## 2012-08-17 NOTE — Evaluation (Signed)
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 WIll benefit from acute OT wtih follow up OT on inpatient rehab.    OT Assessment  Patient needs continued OT Services    Follow Up Recommendations  Inpatient Rehab    Barriers to Discharge None    Equipment Recommendations  3 in 1 bedside comode       Frequency  Min 3X/week    Precautions / Restrictions Precautions Precautions: Fall Restrictions Weight Bearing Restrictions: No       ADL  Eating/Feeding: Simulated;Set up Where Assessed - Eating/Feeding: Chair Grooming: Simulated;Minimal assistance Where Assessed - Grooming: Unsupported sitting Upper Body Bathing: Simulated;Minimal assistance Where Assessed - Upper Body Bathing: Unsupported standing Lower Body Bathing: Simulated;Moderate assistance Where Assessed - Lower Body Bathing: Supported sit to stand Upper Body Dressing: Simulated;Moderate assistance Where Assessed - Upper Body Dressing: Unsupported sitting Lower Body Dressing: Performed;Moderate assistance Where Assessed - Lower Body Dressing: Supported sit to stand Toilet Transfer: Haematologist: Patient Percentage: 60% Armed forces technical officer Method: Arts development officer:  (Bed to recliner going to pt right) Toileting - Clothing Manipulation and Hygiene: Performed;+1 Total assistance Where Assessed - Camera operator Manipulation and Hygiene: Standing Equipment Used: Gait belt Transfers/Ambulation Related to ADLs: Mod A for sit to stand and stand to sit from bed    OT Diagnosis: Generalized weakness;Hemiplegia non-dominant side  OT Problem List: Decreased strength;Decreased range of motion;Impaired balance (sitting and/or standing);Decreased coordination;Decreased knowledge of use of DME or AE;Impaired sensation;Impaired UE functional use OT Treatment Interventions: Self-care/ADL training;Neuromuscular education;DME and/or AE instruction;Therapeutic activities;Patient/family education;Balance training   OT Goals Acute Rehab OT Goals OT Goal Formulation: With patient Time For Goal Achievement: 08/24/12 Potential to Achieve Goals: Good ADL Goals Pt Will Perform Grooming: with min assist;Supported;Standing at sink (1 task with Mod A to maintain standing) ADL Goal: Grooming - Progress: Goal set today Pt Will Transfer to Toilet: with min assist;Squat pivot transfer;3-in-1;Grab bars ADL Goal: Toilet Transfer - Progress: Goal set today Miscellaneous OT Goals Miscellaneous OT Goal #1: Pt will be able to go from sit to stand from bed and recliner with Min A OT Goal: Miscellaneous Goal #1 - Progress: Goal set today Miscellaneous OT Goal #2: Pt will attend to LUE and make sure it is safe with transfers and bed mobility OT Goal: Miscellaneous Goal #2 - Progress: Goal set today Miscellaneous OT Goal #3: Pt will be Min A with LUE exercies to maintain range and increase functional use OT Goal: Miscellaneous Goal #3 - Progress: Goal set today  Visit Information  Last OT Received On: 08/17/12 Assistance Needed: +2 PT/OT Co-Evaluation/Treatment: Yes    Subjective Data  Patient Stated Goal: Did not ask   Prior Functioning     Home Living Lives With: Spouse;Daughter (wife's sister and brother-in-law) Available Help at Discharge: Family;Available 24 hours/day Type of Home: House Home Access: Stairs to enter [employer] of Steps: 1 Entrance Stairs-Rails: None Home Layout: Two level (has elevator to second floor) Bathroom Shower/Tub: Walk-in  shower;Door Bathroom Toilet: Handicapped height Bathroom Accessibility: Yes How Accessible: Accessible via wheelchair;Accessible via walker Home Adaptive Equipment: None Prior Function Level  of Independence: Independent Able to Take Stairs?: Yes Driving: Yes Vocation: Retired Corporate investment banker: No difficulties Dominant Hand: Right         Vision/Perception Vision - Assessment Vision Assessment: Vision tested Ocular Range of Motion: Within Functional Limits Tracking/Visual Pursuits: Able to track stimulus in all quads without difficulty Visual Fields: No apparent deficits   Cognition  Overall Cognitive Status: Appears within functional limits for tasks assessed/performed Arousal/Alertness: Awake/alert Orientation Level: Appears intact for tasks assessed Behavior During Session: [facility] for tasks performed Cognition - Other Comments: wife stated that pt with increased depression    Extremity/Trunk Assessment Right Upper Extremity Assessment RUE ROM/Strength/Tone: Within functional levels RUE Sensation: WFL - Light Touch RUE Coordination: WFL - gross/fine motor Left Upper Extremity Assessment LUE ROM/Strength/Tone: Deficits LUE ROM/Strength/Tone Deficits: Brunstrum 2 starting LUE Sensation: Deficits LUE Sensation Deficits: Does not feel pain with pinch LUE Coordination: Deficits LUE Coordination Deficits: All impaired     Mobility Bed Mobility Bed Mobility: Supine to Sit;Sitting - Scoot to Edge of Bed Supine to Sit: 4: Min assist Sitting - Scoot to [employer] of Bed: 4: Min guard Details for Bed Mobility Assistance: Min assist with LLE as pt had increased difficulty towards end of transfer. Cueing for proper sequencing Transfers Sit to Stand: 3: Mod assist;With upper extremity assist;From bed Stand to Sit: 3: Mod assist;With upper extremity assist;To chair/3-in-1 Details for Transfer Assistance: VC for hand placement. Mod assist for stability and for anterior  translation. Tactile cues and assist to maintain L knee from buckling during transfer. Assit for controlled ascent/descent. VC throughout for proper sequencing and hand placement as well as safety during transfer           Balance Balance Balance Assessed: Yes Static Sitting Balance Static Sitting - Balance Support: Right upper extremity supported;Feet supported Static Sitting - Level of Assistance: 4: Min assist Static Sitting - Comment/# of Minutes: Pt with increased R lateral lean while sitting with back supported in recliner. VC as well as visual cues through the mirror to increase proprioception during sitting to find center of gravity.    End of Session OT - End of Session Equipment Utilized During Treatment: Gait belt Activity Tolerance: Patient tolerated treatment well Patient left: in chair;with call bell/phone within reach;with family/visitor present Nurse Communication: Mobility status       Almon Register [id number] 08/17/2012, 3:46 PM

## 2012-08-17 NOTE — Progress Notes (Signed)
Patient complained of painful spasms occurring on the left mostly at the shoulder, rib cage and knee. Spasms last about 30 seconds are occurring every 3-5 minutes. Rates the discomfort at a 8/10.  Paged Dr. Doy Mince who then ordered a stat EKG.  She will be up to see the patient soon.  Will keep monitoring.

## 2012-08-17 NOTE — Progress Notes (Signed)
Patient complained of painful spasms occurring on the left mostly at the shoulder, rib cage and knee.  Spasms last about 30 seconds are occurring every 3-5 minutes.  Rates the discomfort at a 8/10.  Feels as if his organs are coming out.  Did not respond to Tylenol.   EKG ordered that showed no significant changes from his admission EKG.  Troponins ordered as well.   Symptoms more likely related to thalamic infarct and this was explained to the patient and his wife.  Ultram ordered.    Alexis Goodell, MD [facility] [phone] 08/17/2012  1:03AM

## 2012-08-17 NOTE — Progress Notes (Signed)
Stroke Team Progress Note  HISTORY [name] is an 61 y.o. male with a history of TIA, diabetes mellitus, hyperlipidemia and hypertension, presenting with acute recurrent weakness and numbness involving his left side. He was last seen normal 08/14/2012 at 1000. He had a TIA with similar presentation in May 2013. Workup was unremarkable. Patient is on aspirin at that time and was switched to Plavix 75 mg per day. CT scan of his head today showed no acute intracranial abnormality. Left facial weakness as well as left upper extremity weakness improved after arriving in the emergency room. Numbness was persistent, mass was moderately severe weakness involving left lower extremity proximally. NIH stroke score initially was 6. Subsequent score was 5. He is status post laryngeal surgery less than one week ago. Patient was not a TPA candidate secondary to recent major surgery and resolving deficits. He was admitted for further evaluation and treatment.  SUBJECTIVE Wife at bedside. Reports left sided spasms last night, started on ultram.  OBJECTIVE Most recent Vital Signs: Filed Vitals:   08/16/12 2000 08/16/12 2148 08/17/12 0221 08/17/12 0540  BP: 147/78 165/92 173/85 143/72  Pulse: 110 66 75 69  Temp:  98.1 F (36.7 C) 98.2 F (36.8 C) 97.7 F (36.5 C)  TempSrc:  Oral Oral Oral  Resp: 17 17 17 17   Height:      Weight:  89.223 kg (196 lb 11.2 oz)    SpO2: 100% 98% 100% 99%   CBG (last 3)   Basename 08/17/12 0620 08/16/12 1544 08/16/12 1214  GLUCAP 171* 193* 178*   Intake/Output from previous day: 09/25 0701 - 09/26 0700 In: [id number] [P.O.:720; I.V.:975] Out: 1080 [Urine:1080]  IV Fluid Intake:      . DISCONTD: sodium chloride 75 mL/hr at 08/16/12 1619    MEDICATIONS     . atenolol  25 mg Oral Daily  . clopidogrel  75 mg Oral Daily  . enoxaparin (LOVENOX) injection  40 mg Subcutaneous Q24H  . feeding supplement  237 mL Oral Q1500  . fluticasone  2 spray Each Nare Daily  .  hydrochlorothiazide  12.5 mg Oral Daily  . insulin aspart  0-15 Units Subcutaneous TID AC & HS  . lisinopril  2.5 mg Oral Daily  . pantoprazole  40 mg Oral BID  . potassium chloride  40 mEq Oral Once  . simvastatin  20 mg Oral q1800  . DISCONTD: insulin aspart  0-15 Units Subcutaneous Q4H   PRN:  acetaminophen, labetalol, ondansetron (ZOFRAN) IV, senna-docusate, traMADol  Diet:  Dysphagia 2 thin liquid carb modified Activity:  As tolerated DVT Prophylaxis:  Lovenox 40 mg sq daily   CLINICALLY SIGNIFICANT STUDIES Basic Metabolic Panel:   Lab 0000000 0550 08/16/12 0500  NA 138 138  K 3.7 3.3*  CL 103 103  CO2 26 25  GLUCOSE 182* 114*  BUN 11 9  CREATININE 1.07 1.12  CALCIUM 9.2 9.2  MG -- 2.1  PHOS -- 3.6   Liver Function Tests:   Lab 08/14/12 1224 08/14/12 0900  AST 19 22  ALT 20 23  ALKPHOS 67 59  BILITOT 0.6 1.0  PROT 7.5 7.5  ALBUMIN 3.8 4.0   CBC:   Lab 08/17/12 0550 08/16/12 0500 08/14/12 1224  WBC 6.1 6.3 --  NEUTROABS -- -- 2.9  HGB 12.1* 12.5* --  HCT 34.9* 35.7* --  MCV 87.9 87.7 --  PLT 204 205 --   Coagulation:   Lab 08/14/12 1224  LABPROT 13.7  INR 1.06  Cardiac Enzymes:   Lab 08/17/12 0550 08/17/12 0047 08/14/12 1225  CKTOTAL -- -- --  CKMB -- -- --  CKMBINDEX -- -- --  TROPONINI <0.30 <0.30 <0.30   Lipid Panel    Component Value Date/Time   CHOL 90 08/15/2012 1008   TRIG 59 08/15/2012 1008   HDL 52 08/15/2012 1008   CHOLHDL 1.7 08/15/2012 1008   VLDL 12 08/15/2012 1008   LDLCALC 26 08/15/2012 1008   HgbA1C  Lab Results  Component Value Date   HGBA1C 9.4* 08/15/2012    Urine Drug Screen:     Component Value Date/Time   LABOPIA POSITIVE* 08/14/2012 1316   COCAINSCRNUR NONE DETECTED 08/14/2012 1316   LABBENZ NONE DETECTED 08/14/2012 1316   AMPHETMU NONE DETECTED 08/14/2012 1316   THCU NONE DETECTED 08/14/2012 1316   LABBARB NONE DETECTED 08/14/2012 1316    Alcohol Level: No results found for this basename: ETH:2 in the last 168  hours  CT of the brain   08/14/2012   No acute intracranial abnormality.   08/14/2012  No acute intracranial abnormality.  Atrophy, chronic microvascular disease.    CT Angio Neck  08/14/2012   No extracranial vascular abnormalities detected.  No flow limiting stenosis or dissection.  No change from prior normal spine.      CT Angio Head  08/14/2012   Unremarkable CT angiography of the intracranial circulation.  No change from prior normal study.    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  Prominent small vessel disease type changes.  Global atrophy without hydrocephalus.  Paranasal sinus disease most notable right maxillary sinus   MRA of the brain  08/16/2012  Intracranial atherosclerotic type changes as noted above with ectasia of medium and large size vessels and branch vessel irregularity.  2D Echocardiogram  EF 50-55% with no source of embolus.   Carotid Doppler  Bilateral: No evidence of hemodynamically significant internal carotid artery stenosis. Vertebral artery flow is antegrade.   CXR  08/14/2012   No acute cardiopulmonary disease.     EKG  unchanged from previous tracings, normal sinus rhythm.   Therapy Recommendations PT - CIR  Physical Exam   Pleasant middle-aged [place] American male currently not in distress.Awake alert. Afebrile. Head is nontraumatic. Neck is supple without bruit. Hearing is normal. Cardiac exam no murmur or gallop. Lungs are clear to auscultation. Distal pulses are well felt.  Neurological Exam : awake alert oriented to time place and person. Severe dysarthria but can be understood. No aphasia. Extraocular moments are full range though there is slight a right gaze preference. He blinks to threat bilaterally. Fundi were not visualized. Vision acuity appears normal. There is moderate left lower facial weakness. Tongue is midline. Cough and gag a week. He has been slept hemiplegia with grade  2/5 strength in the left upper extremity and 1/5 strength in the left lower extremity. Sensation is impaired on the left side. Coordination and gait could not be tested. Left plantar is upgoing. Right is downgoing.    ASSESSMENT [name] is a 61 y.o. male presenting with weakness and numbness involving his left side. MRI confirms a right thalamic/posterior limb right internal  capsule infarct. Infarct secondary to small vessel disease. On clopidogrel 75 mg orally every day prior to admission. Now on clopidogrel 75 mg orally every day for secondary stroke prevention. Patient with resultant left hemiparesis, left hemisensory deficit. Inpatient rehab recommended.   Left  shoulder, leg and rib shooting pain, burning secondary to existing cervical spine disease aggravated by bedrest  CAD - stent 2001, 2004; angioplasty 2005  Diabetes, HgbA1c 9.4  Hyperlipidemia, LDL 26, on pravachol 40 PTA, on simva 20 here. Hypertension. Resumed home meds. Pt states lisinopril was stopped during last admission due to cough. TIA 02/2012, MRI negative for acute stroke   Laryngoplasty 08/07/2012  Hypokalemia, resolved, K 3.7  Hospital day # 3  TREATMENT/PLAN  Decrease statin by 50%  Add neurontin for left sided pain  Medically ready for transfer to rehab when bed available Consider ACCELERATE trial, use of Cholesteryl Ester Transfer Protein (CETP) Inhibitor: Potential of Evacetrapib to treat atherosclerosis and CAD. [facility] will contact patient with information about trial, screen for inclusion. D/W wife at bedside  Burnetta Sabin, MSN, RN, ANVP-BC, ANP-BC, GNP-BC [facility] Pager: [phone] 08/17/2012 10:14 AM  Scribe for Dr. Antony Contras, [place] Director, who has personally reviewed chart, pertinent data, examined the patient and developed the plan of care. Pager:  [phone]

## 2012-08-17 NOTE — Progress Notes (Signed)
Patient ID: [name], male   DOB: 07/30/1951, 61 y.o.   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 Recent CVA, regaining some left sided strength. He has a pretty good voice and seems to feel that his swallowing is much better than before the procedure. Neck is healing very nicely without swelling or infection. He will follow up with me as outpatient in the future if he has any trouble.

## 2012-08-17 NOTE — Progress Notes (Signed)
Physical Therapy Treatment Patient Details Name: [name] MRN: [id number] DOB: 14-Jan-1951 Today's Date: 08/17/2012 Time: [id number] PT Time Calculation (min): 25 min  PT Assessment / Plan / Recommendation Comments on Treatment Session  Pt progressing well, highly motivated to increase strength and mobility. Focused session on increasing center of gravity and propriocpetion in all positions by use of mirror and verbal cues. Continue per plan for increased mobility and safety.     Follow Up Recommendations  Inpatient Rehab    Barriers to Discharge        Equipment Recommendations  Rolling walker with 5" wheels;Wheelchair (measurements);Wheelchair cushion (measurements)    Recommendations for Other Services Rehab consult  Frequency Min 4X/week   Plan Discharge plan remains appropriate;Frequency remains appropriate    Precautions / Restrictions Precautions Precautions: Fall Restrictions Weight Bearing Restrictions: No   Pertinent Vitals/Pain Pt with no pain complaints this session.     Mobility  Bed Mobility Bed Mobility: Supine to Sit;Sitting - Scoot to Edge of Bed Supine to Sit: 4: Min assist Sitting - Scoot to [employer] of Bed: 4: Min guard Details for Bed Mobility Assistance: Min assist with LLE as pt had increased difficulty towards end of transfer. Cueing for proper sequencing Transfers Transfers: Sit to Stand;Stand to Sit Sit to Stand: 3: Mod assist;With upper extremity assist;From bed Stand to Sit: 3: Mod assist;With upper extremity assist;To chair/3-in-1 Stand Pivot Transfers: 1: +2 Total assist Stand Pivot Transfers: Patient Percentage: 40% Details for Transfer Assistance: VC for hand placement. Mod assist for stability and for anterior translation. Tactile cues and assist to maintain L knee from buckling during transfer. Assit for controlled ascent/descent. VC throughout for proper sequencing and hand placement as well as safety during  transfer Ambulation/Gait Ambulation/Gait Assistance: Not tested (comment)    Exercises     PT Diagnosis:    PT Problem List:   PT Treatment Interventions:     PT Goals Acute Rehab PT Goals PT Goal Formulation: With patient/family PT Goal: Supine/Side to Sit - Progress: Met PT Goal: Sit to Stand - Progress: Progressing toward goal PT Goal: Stand to Sit - Progress: Progressing toward goal PT Transfer Goal: Bed to Chair/Chair to Bed - Progress: Progressing toward goal  Visit Information  Last PT Received On: 08/17/12 Assistance Needed: +2 PT/OT Co-Evaluation/Treatment: Yes    Subjective Data      Cognition  Overall Cognitive Status: Appears within functional limits for tasks assessed/performed Arousal/Alertness: Awake/alert Orientation Level: Appears intact for tasks assessed Behavior During Session: [facility] for tasks performed Cognition - Other Comments: wife stated that pt with increased depression    Balance  Balance Balance Assessed: Yes Static Sitting Balance Static Sitting - Balance Support: Right upper extremity supported;Feet supported Static Sitting - Level of Assistance: 4: Min assist Static Sitting - Comment/# of Minutes: Pt with increased R lateral lean while sitting with back supported in recliner. VC as well as visual cues through the mirror to increase proprioception during sitting to find center of gravity.   End of Session PT - End of Session Equipment Utilized During Treatment: Gait belt Activity Tolerance: Patient tolerated treatment well Patient left: in chair;with call bell/phone within reach;with family/visitor present Nurse Communication: Mobility status   GP     Ambrose [place] 08/17/2012, 3:05 PM

## 2012-08-18 ENCOUNTER — Encounter (HOSPITAL_COMMUNITY): Payer: Self-pay | Admitting: *Deleted

## 2012-08-18 ENCOUNTER — Inpatient Hospital Stay (HOSPITAL_COMMUNITY)
Admission: RE | Admit: 2012-08-18 | Discharge: 2012-09-07 | DRG: 945 | Disposition: A | Discharge status: Home Health | Payer: Medicare Other | Source: Ambulatory Visit | Attending: Physical Medicine & Rehabilitation | Admitting: Physical Medicine & Rehabilitation

## 2012-08-18 DIAGNOSIS — I2510 Atherosclerotic heart disease of native coronary artery without angina pectoris: Secondary | ICD-10-CM | POA: Diagnosis present

## 2012-08-18 DIAGNOSIS — I639 Cerebral infarction, unspecified: Secondary | ICD-10-CM | POA: Diagnosis present

## 2012-08-18 DIAGNOSIS — Z5189 Encounter for other specified aftercare: Principal | ICD-10-CM

## 2012-08-18 DIAGNOSIS — G8929 Other chronic pain: Secondary | ICD-10-CM | POA: Diagnosis present

## 2012-08-18 DIAGNOSIS — B370 Candidal stomatitis: Secondary | ICD-10-CM | POA: Diagnosis present

## 2012-08-18 DIAGNOSIS — E1159 Type 2 diabetes mellitus with other circulatory complications: Secondary | ICD-10-CM | POA: Diagnosis present

## 2012-08-18 DIAGNOSIS — M79609 Pain in unspecified limb: Secondary | ICD-10-CM | POA: Diagnosis present

## 2012-08-18 DIAGNOSIS — Z87891 Personal history of nicotine dependence: Secondary | ICD-10-CM

## 2012-08-18 DIAGNOSIS — E785 Hyperlipidemia, unspecified: Secondary | ICD-10-CM | POA: Diagnosis present

## 2012-08-18 DIAGNOSIS — R1310 Dysphagia, unspecified: Secondary | ICD-10-CM | POA: Diagnosis present

## 2012-08-18 DIAGNOSIS — K219 Gastro-esophageal reflux disease without esophagitis: Secondary | ICD-10-CM | POA: Diagnosis present

## 2012-08-18 DIAGNOSIS — G8190 Hemiplegia, unspecified affecting unspecified side: Secondary | ICD-10-CM | POA: Diagnosis present

## 2012-08-18 DIAGNOSIS — K5900 Constipation, unspecified: Secondary | ICD-10-CM | POA: Diagnosis present

## 2012-08-18 DIAGNOSIS — I10 Essential (primary) hypertension: Secondary | ICD-10-CM | POA: Diagnosis present

## 2012-08-18 DIAGNOSIS — I152 Hypertension secondary to endocrine disorders: Secondary | ICD-10-CM | POA: Diagnosis present

## 2012-08-18 DIAGNOSIS — I6330 Cerebral infarction due to thrombosis of unspecified cerebral artery: Secondary | ICD-10-CM

## 2012-08-18 DIAGNOSIS — Z794 Long term (current) use of insulin: Secondary | ICD-10-CM

## 2012-08-18 DIAGNOSIS — E119 Type 2 diabetes mellitus without complications: Secondary | ICD-10-CM | POA: Diagnosis present

## 2012-08-18 DIAGNOSIS — Z8673 Personal history of transient ischemic attack (TIA), and cerebral infarction without residual deficits: Secondary | ICD-10-CM

## 2012-08-18 DIAGNOSIS — Z7902 Long term (current) use of antithrombotics/antiplatelets: Secondary | ICD-10-CM

## 2012-08-18 DIAGNOSIS — E782 Mixed hyperlipidemia: Secondary | ICD-10-CM | POA: Diagnosis present

## 2012-08-18 DIAGNOSIS — R339 Retention of urine, unspecified: Secondary | ICD-10-CM | POA: Diagnosis present

## 2012-08-18 DIAGNOSIS — J3800 Paralysis of vocal cords and larynx, unspecified: Secondary | ICD-10-CM | POA: Diagnosis present

## 2012-08-18 DIAGNOSIS — E1169 Type 2 diabetes mellitus with other specified complication: Secondary | ICD-10-CM | POA: Diagnosis present

## 2012-08-18 DIAGNOSIS — M549 Dorsalgia, unspecified: Secondary | ICD-10-CM | POA: Diagnosis present

## 2012-08-18 LAB — CBC
HCT: 36.2 % — ABNORMAL LOW (ref 39.0–52.0)
Hemoglobin: 12.7 g/dL — ABNORMAL LOW (ref 13.0–17.0)
MCH: 31 pg (ref 26.0–34.0)
MCHC: 35.1 g/dL (ref 30.0–36.0)
MCV: 88.3 fL (ref 78.0–100.0)
Platelets: 218 10*3/uL (ref 150–400)
RBC: 4.1 MIL/uL — ABNORMAL LOW (ref 4.22–5.81)
RDW: 12.2 % (ref 11.5–15.5)
WBC: 6.4 10*3/uL (ref 4.0–10.5)

## 2012-08-18 LAB — GLUCOSE, CAPILLARY
Glucose-Capillary: 197 mg/dL — ABNORMAL HIGH (ref 70–99)
Glucose-Capillary: 250 mg/dL — ABNORMAL HIGH (ref 70–99)
Glucose-Capillary: 155 mg/dL — ABNORMAL HIGH (ref 70–99)
Glucose-Capillary: 157 mg/dL — ABNORMAL HIGH (ref 70–99)

## 2012-08-18 MED ORDER — BISACODYL 10 MG RE SUPP
10.0000 mg | Freq: Once | RECTAL | Status: AC
  Administered 2012-08-18: 10 mg via RECTAL
  Filled 2012-08-18: qty 1

## 2012-08-18 MED ORDER — NITROGLYCERIN 0.4 MG SL SUBL: 0.4000 mg | SUBLINGUAL_TABLET | SUBLINGUAL | Status: DC | PRN

## 2012-08-18 MED ORDER — FLEET ENEMA 7-19 GM/118ML RE ENEM
1.0000 | ENEMA | Freq: Once | RECTAL | Status: AC | PRN
  Filled 2012-08-18: qty 1

## 2012-08-18 MED ORDER — TRAMADOL HCL 50 MG PO TABS
50.0000 mg | ORAL_TABLET | Freq: Four times a day (QID) | ORAL | Status: DC | PRN
  Administered 2012-08-18 – 2012-09-07 (×17): 50 mg via ORAL
  Filled 2012-08-18 (×21): qty 1

## 2012-08-18 MED ORDER — ATENOLOL 25 MG PO TABS
25.0000 mg | ORAL_TABLET | Freq: Every day | ORAL | Status: DC
  Administered 2012-08-19 – 2012-09-07 (×20): 25 mg via ORAL
  Filled 2012-08-18 (×22): qty 1

## 2012-08-18 MED ORDER — ALUM & MAG HYDROXIDE-SIMETH 200-200-20 MG/5ML PO SUSP: 30.0000 mL | ORAL | Status: DC | PRN

## 2012-08-18 MED ORDER — CLOPIDOGREL BISULFATE 75 MG PO TABS
75.0000 mg | ORAL_TABLET | Freq: Every day | ORAL | Status: DC
  Administered 2012-08-19 – 2012-09-07 (×20): 75 mg via ORAL
  Filled 2012-08-18 (×21): qty 1

## 2012-08-18 MED ORDER — PROCHLORPERAZINE 25 MG RE SUPP
12.5000 mg | Freq: Four times a day (QID) | RECTAL | Status: DC | PRN
  Filled 2012-08-18: qty 1

## 2012-08-18 MED ORDER — INSULIN ASPART 100 UNIT/ML ~~LOC~~ SOLN
0.0000 [IU] | Freq: Three times a day (TID) | SUBCUTANEOUS | Status: DC
  Administered 2012-08-18 (×2): 3 [IU] via SUBCUTANEOUS
  Administered 2012-08-19: 8 [IU] via SUBCUTANEOUS
  Administered 2012-08-19 – 2012-08-20 (×2): 3 [IU] via SUBCUTANEOUS
  Administered 2012-08-20: 5 [IU] via SUBCUTANEOUS
  Administered 2012-08-20 – 2012-08-21 (×3): 3 [IU] via SUBCUTANEOUS
  Administered 2012-08-21: 2 [IU] via SUBCUTANEOUS
  Administered 2012-08-21: 3 [IU] via SUBCUTANEOUS
  Administered 2012-08-22 – 2012-08-23 (×5): 2 [IU] via SUBCUTANEOUS
  Administered 2012-08-23: 3 [IU] via SUBCUTANEOUS
  Administered 2012-08-23 – 2012-08-24 (×2): 2 [IU] via SUBCUTANEOUS
  Administered 2012-08-24 (×2): 5 [IU] via SUBCUTANEOUS
  Administered 2012-08-25: 2 [IU] via SUBCUTANEOUS
  Administered 2012-08-25: 3 [IU] via SUBCUTANEOUS
  Administered 2012-08-25: 2 [IU] via SUBCUTANEOUS
  Administered 2012-08-26: 3 [IU] via SUBCUTANEOUS
  Administered 2012-08-26: 2 [IU] via SUBCUTANEOUS
  Administered 2012-08-26 – 2012-08-27 (×2): 3 [IU] via SUBCUTANEOUS
  Administered 2012-08-27: 2 [IU] via SUBCUTANEOUS
  Administered 2012-08-28: 3 [IU] via SUBCUTANEOUS
  Administered 2012-08-29 – 2012-09-02 (×7): 2 [IU] via SUBCUTANEOUS
  Administered 2012-09-02: 3 [IU] via SUBCUTANEOUS
  Administered 2012-09-02: 2 [IU] via SUBCUTANEOUS
  Administered 2012-09-03: 3 [IU] via SUBCUTANEOUS
  Administered 2012-09-03: 2 [IU] via SUBCUTANEOUS
  Administered 2012-09-03: 3 [IU] via SUBCUTANEOUS
  Administered 2012-09-04 – 2012-09-05 (×3): 2 [IU] via SUBCUTANEOUS
  Administered 2012-09-05: 3 [IU] via SUBCUTANEOUS
  Administered 2012-09-06 (×3): 2 [IU] via SUBCUTANEOUS
  Administered 2012-09-07: 3 [IU] via SUBCUTANEOUS

## 2012-08-18 MED ORDER — ENOXAPARIN SODIUM 40 MG/0.4ML ~~LOC~~ SOLN
40.0000 mg | SUBCUTANEOUS | Status: DC
  Administered 2012-08-18 – 2012-09-06 (×20): 40 mg via SUBCUTANEOUS
  Filled 2012-08-18 (×21): qty 0.4

## 2012-08-18 MED ORDER — POLYETHYLENE GLYCOL 3350 17 G PO PACK
17.0000 g | PACK | Freq: Every day | ORAL | Status: DC
  Administered 2012-08-18 – 2012-09-07 (×21): 17 g via ORAL
  Filled 2012-08-18 (×25): qty 1

## 2012-08-18 MED ORDER — GLUCERNA SHAKE PO LIQD
237.0000 mL | Freq: Every day | ORAL | Status: DC
  Administered 2012-08-19 – 2012-09-05 (×13): 237 mL via ORAL

## 2012-08-18 MED ORDER — PROCHLORPERAZINE EDISYLATE 5 MG/ML IJ SOLN
5.0000 mg | Freq: Four times a day (QID) | INTRAMUSCULAR | Status: DC | PRN
  Filled 2012-08-18: qty 2

## 2012-08-18 MED ORDER — GUAIFENESIN-DM 100-10 MG/5ML PO SYRP: 5.0000 mL | ORAL_SOLUTION | Freq: Four times a day (QID) | ORAL | Status: DC | PRN

## 2012-08-18 MED ORDER — DIPHENHYDRAMINE HCL 12.5 MG/5ML PO ELIX
12.5000 mg | ORAL_SOLUTION | Freq: Four times a day (QID) | ORAL | Status: DC | PRN
  Filled 2012-08-18: qty 10

## 2012-08-18 MED ORDER — INSULIN ASPART 100 UNIT/ML ~~LOC~~ SOLN
2.0000 [IU] | Freq: Three times a day (TID) | SUBCUTANEOUS | Status: DC
  Administered 2012-08-18: 4 [IU] via SUBCUTANEOUS
  Administered 2012-08-19: 2 [IU] via SUBCUTANEOUS
  Administered 2012-08-19 – 2012-08-20 (×2): 4 [IU] via SUBCUTANEOUS
  Administered 2012-08-20: 2 [IU] via SUBCUTANEOUS
  Administered 2012-08-20: 4 [IU] via SUBCUTANEOUS
  Administered 2012-08-21: 2 [IU] via SUBCUTANEOUS
  Administered 2012-08-21 – 2012-08-22 (×3): 4 [IU] via SUBCUTANEOUS
  Administered 2012-08-22: 2 [IU] via SUBCUTANEOUS
  Administered 2012-08-22 – 2012-08-23 (×3): 4 [IU] via SUBCUTANEOUS
  Administered 2012-08-23: 2 [IU] via SUBCUTANEOUS
  Administered 2012-08-24: 4 [IU] via SUBCUTANEOUS
  Administered 2012-08-24: 2 [IU] via SUBCUTANEOUS
  Administered 2012-08-24: 4 [IU] via SUBCUTANEOUS
  Administered 2012-08-25: 2 [IU] via SUBCUTANEOUS
  Administered 2012-08-25 (×2): 4 [IU] via SUBCUTANEOUS
  Administered 2012-08-26: 2 [IU] via SUBCUTANEOUS
  Administered 2012-08-26: 4 [IU] via SUBCUTANEOUS
  Administered 2012-08-26: 2 [IU] via SUBCUTANEOUS
  Administered 2012-08-27 (×2): 4 [IU] via SUBCUTANEOUS
  Administered 2012-08-27: 2 [IU] via SUBCUTANEOUS
  Administered 2012-08-28: 4 [IU] via SUBCUTANEOUS
  Administered 2012-08-28: 2 [IU] via SUBCUTANEOUS
  Administered 2012-08-29 (×2): 4 [IU] via SUBCUTANEOUS
  Administered 2012-08-29: 2 [IU] via SUBCUTANEOUS
  Administered 2012-08-30 (×2): 4 [IU] via SUBCUTANEOUS
  Administered 2012-08-30: 2 [IU] via SUBCUTANEOUS
  Administered 2012-08-31 – 2012-09-01 (×3): 4 [IU] via SUBCUTANEOUS
  Administered 2012-09-01: 2 [IU] via SUBCUTANEOUS
  Administered 2012-09-01: 4 [IU] via SUBCUTANEOUS
  Administered 2012-09-02: 2 [IU] via SUBCUTANEOUS
  Administered 2012-09-02 (×2): 4 [IU] via SUBCUTANEOUS
  Administered 2012-09-03: 2 [IU] via SUBCUTANEOUS
  Administered 2012-09-03 (×2): 4 [IU] via SUBCUTANEOUS

## 2012-08-18 MED ORDER — FLUTICASONE PROPIONATE 50 MCG/ACT NA SUSP
2.0000 | Freq: Every day | NASAL | Status: DC
  Administered 2012-08-20 – 2012-08-27 (×6): 2 via NASAL
  Filled 2012-08-18: qty 16

## 2012-08-18 MED ORDER — PROCHLORPERAZINE MALEATE 5 MG PO TABS
5.0000 mg | ORAL_TABLET | Freq: Four times a day (QID) | ORAL | Status: DC | PRN
  Filled 2012-08-18: qty 2

## 2012-08-18 MED ORDER — LISINOPRIL 2.5 MG PO TABS
2.5000 mg | ORAL_TABLET | Freq: Every day | ORAL | Status: DC
  Administered 2012-08-19: 2.5 mg via ORAL
  Filled 2012-08-18 (×3): qty 1

## 2012-08-18 MED ORDER — HYDROCHLOROTHIAZIDE 12.5 MG PO CAPS
12.5000 mg | ORAL_CAPSULE | Freq: Every day | ORAL | Status: DC
  Administered 2012-08-19 – 2012-09-07 (×20): 12.5 mg via ORAL
  Filled 2012-08-18 (×21): qty 1

## 2012-08-18 MED ORDER — INSULIN GLARGINE 100 UNIT/ML ~~LOC~~ SOLN
10.0000 [IU] | Freq: Every day | SUBCUTANEOUS | Status: DC
  Administered 2012-08-18 – 2012-08-24 (×7): 10 [IU] via SUBCUTANEOUS

## 2012-08-18 MED ORDER — PANTOPRAZOLE SODIUM 40 MG PO TBEC
40.0000 mg | DELAYED_RELEASE_TABLET | Freq: Two times a day (BID) | ORAL | Status: DC
  Administered 2012-08-18 – 2012-09-07 (×40): 40 mg via ORAL
  Filled 2012-08-18 (×45): qty 1

## 2012-08-18 MED ORDER — ACETAMINOPHEN 325 MG PO TABS: 325.0000 mg | ORAL_TABLET | ORAL | Status: DC | PRN

## 2012-08-18 MED ORDER — BISACODYL 10 MG RE SUPP
10.0000 mg | Freq: Every day | RECTAL | Status: DC | PRN
  Administered 2012-08-20 – 2012-08-28 (×4): 10 mg via RECTAL
  Filled 2012-08-18 (×5): qty 1

## 2012-08-18 MED ORDER — SUCRALFATE 1 GM/10ML PO SUSP
1.0000 g | Freq: Three times a day (TID) | ORAL | Status: DC
  Administered 2012-08-18 – 2012-08-23 (×20): 1 g via ORAL
  Filled 2012-08-18 (×28): qty 10

## 2012-08-18 MED ORDER — GABAPENTIN 100 MG PO CAPS
100.0000 mg | ORAL_CAPSULE | Freq: Three times a day (TID) | ORAL | Status: DC
  Administered 2012-08-18 – 2012-08-19 (×2): 100 mg via ORAL
  Filled 2012-08-18 (×6): qty 1

## 2012-08-18 MED ORDER — SIMVASTATIN 10 MG PO TABS
10.0000 mg | ORAL_TABLET | Freq: Every day | ORAL | Status: DC
  Administered 2012-08-18 – 2012-09-06 (×20): 10 mg via ORAL
  Filled 2012-08-18 (×21): qty 1

## 2012-08-18 MED ORDER — METHOCARBAMOL 500 MG PO TABS
500.0000 mg | ORAL_TABLET | Freq: Four times a day (QID) | ORAL | Status: DC | PRN
  Administered 2012-08-22 – 2012-08-27 (×6): 500 mg via ORAL
  Filled 2012-08-18 (×6): qty 1

## 2012-08-18 MED ORDER — TRAZODONE HCL 50 MG PO TABS: 25.0000 mg | ORAL_TABLET | Freq: Every evening | ORAL | Status: DC | PRN

## 2012-08-18 MED ORDER — GABAPENTIN 300 MG PO CAPS
300.0000 mg | ORAL_CAPSULE | Freq: Three times a day (TID) | ORAL | Status: DC
  Filled 2012-08-18 (×2): qty 1

## 2012-08-18 NOTE — Discharge Summary (Signed)
Stroke Discharge Summary  Patient ID: [name]   MRN: [id number]      DOB: [birth year]  Date of Admission: 08/14/2012 Date of Discharge: 08/18/2012  Attending Physician:  Suzzanne Cloud, MD, Stroke MD  Consulting Physician(s):  Treatment Team:  Md Stroke, MD, Alger Simons, MD (PM&R), Simonne Maffucci, MD (critical care)  Patient's PCP:  [name], MD  Discharge Diagnoses:  Principal Problem:  *CVA (cerebral infarction) - right thalamic/posterior limb right internal  capsule infarct secondary to small vessel disease.  Active Problems:  DIABETES MELLITUS, TYPE II, UNCONTROLLED  HYPERLIPIDEMIA-MIXED  HYPERTENSION, BENIGN  CAD, NATIVE VESSEL - stent 2001, 2004; angioplasty 2005   GERD  SHOULDER PAIN, LEFT   Constipation  Chronic back pain  Dysphagia following cerebrovascular accident  Left hemiplegia  Left hemisensory deficit  TIA 02/2012, MRI negative for acute stroke   Laryngoplasty 08/07/2012   Hypokalemia, resolved  BMI  Body mass index is 27.43 kg/(m^2).   Past Medical History  Diagnosis Date  . ALLERGIC RHINITIS   . CAD, NATIVE VESSEL     BMS to OM1 2001, DES to BMS 2005  . DIAB W/UNSPEC COMP TYPE II/UNSPEC TYPE UNCNTRL   . ERECTILE DYSFUNCTION   . HYPERLIPIDEMIA-MIXED     takes Lipitor daily  . MORTON'S NEUROMA, RIGHT   . SHOULDER PAIN, RIGHT   . PONV (postoperative nausea and vomiting)   . HYPERTENSION, BENIGN     takes Lisinopril and Atenolol daily  . Coughing   . Shortness of breath     lying/sitting/exertion  . Headache     occasionally  . Peripheral neuropathy   . Arthritis   . Joint pain   . Joint swelling   . Chronic back pain     herniated disc  . GERD     takes Protonix daily  . Constipation     takes Carafate four times day  . Seasonal allergies     takes Allegra and Benadryl daily prn;uses Flonase daily  . Diabetes mellitus     on insulin  . Insomnia     doesn't require meds  . TIA on medication 02/2012    takes  Plavix daily;on hold for surgery;rt sided weakness   Past Surgical History  Procedure Date  . Stent 2001, 2004    coronary stents  . Angioplasty   . Left knee surgury     x 2   . Dental surgery   . Coronary angioplasty 2005    2 stents  . Colonoscopy   . Laryngoplasty 08/07/2012    Procedure: LARYNGOPLASTY;  Surgeon: Izora Gala, MD;  Location: [facility];  Service: ENT;  Laterality: Left;  Left Vocal Cord Medialyzation   Medications to be continued on Rehab   . atenolol  25 mg Oral Daily  . clopidogrel  75 mg Oral Daily  . enoxaparin (LOVENOX) injection  40 mg Subcutaneous Q24H  . feeding supplement  237 mL Oral Q1500  . fluticasone  2 spray Each Nare Daily  . gabapentin  100 mg Oral TID  . gabapentin  300 mg Oral TID  . hydrochlorothiazide  12.5 mg Oral Daily  . insulin aspart  0-15 Units Subcutaneous TID AC & HS  . lisinopril  2.5 mg Oral Daily  . pantoprazole  40 mg Oral BID  . simvastatin  10 mg Oral q1800   LABORATORY STUDIES CBC    Component Value Date/Time   WBC 6.4 08/18/2012 0525   RBC 4.10* 08/18/2012 0525  HGB 12.7* 08/18/2012 0525   HCT 36.2* 08/18/2012 0525   PLT 218 08/18/2012 0525   MCV 88.3 08/18/2012 0525   MCH 31.0 08/18/2012 0525   MCHC 35.1 08/18/2012 0525   RDW 12.2 08/18/2012 0525   LYMPHSABS 1.9 08/14/2012 1224   MONOABS 0.4 08/14/2012 1224   EOSABS 0.1 08/14/2012 1224   BASOSABS 0.0 08/14/2012 1224   CMP    Component Value Date/Time   NA 138 08/17/2012 0550   K 3.7 08/17/2012 0550   CL 103 08/17/2012 0550   CO2 26 08/17/2012 0550   GLUCOSE 182* 08/17/2012 0550   BUN 11 08/17/2012 0550   CREATININE 1.07 08/17/2012 0550   CALCIUM 9.2 08/17/2012 0550   PROT 7.5 08/14/2012 1224   ALBUMIN 3.8 08/14/2012 1224   AST 19 08/14/2012 1224   ALT 20 08/14/2012 1224   ALKPHOS 67 08/14/2012 1224   BILITOT 0.6 08/14/2012 1224   GFRNONAA 72* 08/17/2012 0550   GFRAA 84* 08/17/2012 0550   COAGS Lab Results  Component Value Date   INR 1.06 08/14/2012   INR 0.99 04/07/2012    INR 1.0 11/08/2008   Lipid Panel    Component Value Date/Time   CHOL 90 08/15/2012 1008   TRIG 59 08/15/2012 1008   HDL 52 08/15/2012 1008   CHOLHDL 1.7 08/15/2012 1008   VLDL 12 08/15/2012 1008   LDLCALC 26 08/15/2012 1008   HgbA1C  Lab Results  Component Value Date   HGBA1C 9.4* 08/15/2012   Cardiac Panel (last 3 results)   Basename 08/17/12 1127 08/17/12 0550 08/17/12 0047  CKTOTAL -- -- --  CKMB -- -- --  TROPONINI <0.30 <0.30 <0.30  RELINDX -- -- --   Urinalysis    Component Value Date/Time   COLORURINE YELLOW 04/07/2012 1648   APPEARANCEUR CLEAR 04/07/2012 1648   LABSPEC 1.029 04/07/2012 1648   PHURINE 6.0 04/07/2012 1648   GLUCOSEU >1000* 04/07/2012 1648   HGBUR NEGATIVE 04/07/2012 1648   BILIRUBINUR NEGATIVE 04/07/2012 1648   KETONESUR NEGATIVE 04/07/2012 1648   PROTEINUR NEGATIVE 04/07/2012 1648   UROBILINOGEN 0.2 04/07/2012 1648   NITRITE NEGATIVE 04/07/2012 1648   LEUKOCYTESUR NEGATIVE 04/07/2012 1648   Urine Drug Screen    Component Value Date/Time   LABOPIA POSITIVE* 08/14/2012 1316   COCAINSCRNUR NONE DETECTED 08/14/2012 1316   LABBENZ NONE DETECTED 08/14/2012 1316   AMPHETMU NONE DETECTED 08/14/2012 1316   THCU NONE DETECTED 08/14/2012 1316   LABBARB NONE DETECTED 08/14/2012 1316    SIGNIFICANT DIAGNOSTIC STUDIES CT of the brain  08/14/2012 No acute intracranial abnormality.  08/14/2012 No acute intracranial abnormality. Atrophy, chronic microvascular disease.  CT Angio Neck 08/14/2012 No extracranial vascular abnormalities detected. No flow limiting stenosis or dissection. No change from prior normal spine.  CT Angio Head 08/14/2012 Unremarkable CT angiography of the intracranial circulation. No change from prior normal study.  MRI of the brain 08/16/2012 Acute non hemorrhagic right thalamic/posterior limb right internal capsule infarct. Prominent small vessel disease type changes. Global atrophy without hydrocephalus. Paranasal sinus disease most notable right maxillary  sinus  MRA of the brain 08/16/2012 Intracranial atherosclerotic type changes as noted above with ectasia of medium and large size vessels and branch vessel irregularity.  2D Echocardiogram EF 50-55% with no source of embolus.  Carotid Doppler Bilateral: No evidence of hemodynamically significant internal carotid artery stenosis. Vertebral artery flow is antegrade.  CXR 08/14/2012 No acute cardiopulmonary disease.  EKG unchanged from previous tracings, normal sinus rhythm.   History of Present Illness  [name] is an 61 y.o. male with a history of TIA, diabetes mellitus, hyperlipidemia and hypertension, presenting with acute recurrent weakness and numbness involving his left side. He was last seen normal 08/14/2012 at 1000. He had a TIA with similar presentation in May 2013. Workup was unremarkable. Patient is on aspirin at that time and was switched to Plavix 75 mg per day. CT scan of his head today showed no acute intracranial abnormality. Left facial weakness as well as left upper extremity weakness improved after arriving in the emergency room. Numbness was persistent, mass was moderately severe weakness involving left lower extremity proximally. NIH stroke score initially was 6. Subsequent score was 5. He is status post laryngeal surgery less than one week ago. Patient was not a TPA candidate secondary to recent major surgery and resolving deficits. He was admitted for further evaluation and treatment.  Hospital Course   MRI confirms a right thalamic/posterior limb right internal  capsule infarct. Infarct found to be secondary to small vessel disease. On clopidogrel 75 mg orally every day prior to admission. Continued on clopidogrel 75 mg orally every day for secondary stroke prevention. Patient with resultant left hemiparesis, left hemisensory deficit. Inpatient rehab recommended.   In hospital patient complained of left shoulder, leg and rib shooting pain, burning secondary to existing  cervical spine disease aggravated by bedrest. Started on neurontin, which is helping pain.  Hyperlipidemia, LDL 26, on pravachol 40 PTA, initially placed on simvastatin 20 here. Given drop in LDL, decreased statin by 50% given low LDL  Hypertension, beginning to stablize. Resumed home meds. Pt states lisinopril was stopped during last admission due to cough.   Hypokalemia in hospital, replaced. Resolved at time of discharge to rehab.  Physical therapy, occupational therapy and speech therapy evaluated patient. All agreed inpatient rehab is needed. Patient's wife is/are supportive and can provide care at discharge. CIR bed is available today and patient will be transferred there.  Discharge Exam  Blood pressure 148/90, pulse 72, temperature 98.3 F (36.8 C), temperature source Oral, resp. rate 18, height 5\' 11"  (1.803 m), weight 89.223 kg (196 lb 11.2 oz), SpO2 100.00%. Pleasant middle-aged [place] American male currently not in distress.Awake alert. Afebrile. Head is nontraumatic. Neck is supple without bruit. Hearing is normal. Cardiac exam no murmur or gallop. Lungs are clear to auscultation. Distal pulses are well felt.  Neurological Exam : awake alert oriented to time place and person. Severe dysarthria but can be understood. No aphasia. Extraocular moments are full range though there is slight a right gaze preference. He blinks to threat bilaterally. Fundi were not visualized. Vision acuity appears normal. There is moderate left lower facial weakness. Tongue is midline. Cough and gag a week. He has been slept hemiplegia with grade 2/5 strength in the left upper extremity and 1/5 strength in the left lower extremity. Sensation is impaired on the left side. Coordination and gait could not be tested. Left plantar is upgoing. Right is downgoing.  Discharge Diet  Dysphagia thin liquids  Discharge Plan  Disposition:  Transfer to [place] for ongoing PT, OT and ST  clopidogrel 75  mg orally every day for secondary stroke prevention.  Ongoing risk factor control by Primary Care Physician. Consider ACCELERATE trial, use of Cholesteryl Ester Transfer Protein (CETP) Inhibitor: Potential of Evacetrapib to treat atherosclerosis and CAD. [facility] will contact patient with information about trial, screen for inclusion. Risk factor recommendations:  Hypertension target range 130-140/70-80 Lipid range - LDL <  100 and checked every 6 months, fasting Diabetes - HgB A1C <7   Follow-up [name], MD in 1 month.  Follow-up with Dr. Antony Contras in 2 months.  Signed Burnetta Sabin, AVNP, ANP-BC, [facility] Nurse Practitioner 08/18/2012, 12:03 PM  Dr. Antony Contras, [place] Director, has personally reviewed chart, pertinent data, examined the patient and developed the plan of care.

## 2012-08-18 NOTE — Plan of Care (Signed)
Problem: RH BOWEL ELIMINATION Goal: RH STG MANAGE BOWEL WITH ASSISTANCE STG Manage Bowel with minimal Assistance.  Outcome: Progressing BM today with large results Goal: RH STG MANAGE BOWEL W/MEDICATION W/ASSISTANCE STG Manage Bowel with Medication with minimal Assistance.  Outcome: Progressing Large BM today after suppository was given  Problem: RH PAIN MANAGEMENT Goal: RH STG PAIN MANAGED AT OR BELOW PT'S PAIN GOAL 3 or less  Outcome: Progressing Pain level 7 this evening with pain medication intervention

## 2012-08-18 NOTE — Progress Notes (Signed)
Stroke Team Progress Note  HISTORY [name] is an 61 y.o. male with a history of TIA, diabetes mellitus, hyperlipidemia and hypertension, presenting with acute recurrent weakness and numbness involving his left side. He was last seen normal 08/14/2012 at 1000. He had a TIA with similar presentation in May 2013. Workup was unremarkable. Patient is on aspirin at that time and was switched to Plavix 75 mg per day. CT scan of his head today showed no acute intracranial abnormality. Left facial weakness as well as left upper extremity weakness improved after arriving in the emergency room. Numbness was persistent, mass was moderately severe weakness involving left lower extremity proximally. NIH stroke score initially was 6. Subsequent score was 5. He is status post laryngeal surgery less than one week ago. Patient was not a TPA candidate secondary to recent major surgery and resolving deficits. He was admitted for further evaluation and treatment.  SUBJECTIVE Arm pain bette ron gabapentin. No changes. Awaiting rehab decision.  OBJECTIVE Most recent Vital Signs: Filed Vitals:   08/17/12 1850 08/17/12 2134 08/18/12 0212 08/18/12 0542  BP: 142/75 150/90 148/83 136/74  Pulse: 65 66 72 74  Temp: 98.8 F (37.1 C) 98.2 F (36.8 C) 98 F (36.7 C) 98 F (36.7 C)  TempSrc: Oral Oral Oral Oral  Resp: 18 18 18 18   Height:      Weight:      SpO2: 99% 98% 98% 100%   CBG (last 3)   Basename 08/18/12 0621 08/17/12 2137 08/17/12 1645  GLUCAP 197* 151* 232*   MEDICATIONS     . atenolol  25 mg Oral Daily  . clopidogrel  75 mg Oral Daily  . enoxaparin (LOVENOX) injection  40 mg Subcutaneous Q24H  . feeding supplement  237 mL Oral Q1500  . fluticasone  2 spray Each Nare Daily  . gabapentin  100 mg Oral TID  . gabapentin  300 mg Oral TID  . hydrochlorothiazide  12.5 mg Oral Daily  . insulin aspart  0-15 Units Subcutaneous TID AC & HS  . lisinopril  2.5 mg Oral Daily  . pantoprazole  40 mg  Oral BID  . simvastatin  10 mg Oral q1800  . DISCONTD: simvastatin  20 mg Oral q1800   PRN:  acetaminophen, labetalol, ondansetron (ZOFRAN) IV, senna-docusate, traMADol  Diet:  Dysphagia 2 thin liquid carb modified Activity:  As tolerated DVT Prophylaxis:  Lovenox 40 mg sq daily   CLINICALLY SIGNIFICANT STUDIES Basic Metabolic Panel:   Lab 0000000 0550 08/16/12 0500  NA 138 138  K 3.7 3.3*  CL 103 103  CO2 26 25  GLUCOSE 182* 114*  BUN 11 9  CREATININE 1.07 1.12  CALCIUM 9.2 9.2  MG -- 2.1  PHOS -- 3.6   Liver Function Tests:   Lab 08/14/12 1224 08/14/12 0900  AST 19 22  ALT 20 23  ALKPHOS 67 59  BILITOT 0.6 1.0  PROT 7.5 7.5  ALBUMIN 3.8 4.0   CBC:   Lab 08/18/12 0525 08/17/12 0550 08/14/12 1224  WBC 6.4 6.1 --  NEUTROABS -- -- 2.9  HGB 12.7* 12.1* --  HCT 36.2* 34.9* --  MCV 88.3 87.9 --  PLT 218 204 --   Coagulation:   Lab 08/14/12 1224  LABPROT 13.7  INR 1.06   Cardiac Enzymes:   Lab 08/17/12 1127 08/17/12 0550 08/17/12 0047  CKTOTAL -- -- --  CKMB -- -- --  CKMBINDEX -- -- --  TROPONINI <0.30 <0.30 <0.30   Lipid Panel  Component Value Date/Time   CHOL 90 08/15/2012 1008   TRIG 59 08/15/2012 1008   HDL 52 08/15/2012 1008   CHOLHDL 1.7 08/15/2012 1008   VLDL 12 08/15/2012 1008   LDLCALC 26 08/15/2012 1008   HgbA1C  Lab Results  Component Value Date   HGBA1C 9.4* 08/15/2012    Urine Drug Screen:     Component Value Date/Time   LABOPIA POSITIVE* 08/14/2012 1316   COCAINSCRNUR NONE DETECTED 08/14/2012 1316   LABBENZ NONE DETECTED 08/14/2012 1316   AMPHETMU NONE DETECTED 08/14/2012 1316   THCU NONE DETECTED 08/14/2012 1316   LABBARB NONE DETECTED 08/14/2012 1316    Alcohol Level: No results found for this basename: ETH:2 in the last 168 hours  CT of the brain   08/14/2012   No acute intracranial abnormality.   08/14/2012  No acute intracranial abnormality.  Atrophy, chronic microvascular disease.    CT Angio Neck  08/14/2012   No extracranial  vascular abnormalities detected.  No flow limiting stenosis or dissection.  No change from prior normal spine.      CT Angio Head  08/14/2012   Unremarkable CT angiography of the intracranial circulation.  No change from prior normal study.    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  Prominent small vessel disease type changes.  Global atrophy without hydrocephalus.  Paranasal sinus disease most notable right maxillary sinus   MRA of the brain  08/16/2012  Intracranial atherosclerotic type changes as noted above with ectasia of medium and large size vessels and branch vessel irregularity.  2D Echocardiogram  EF 50-55% with no source of embolus.   Carotid Doppler  Bilateral: No evidence of hemodynamically significant internal carotid artery stenosis. Vertebral artery flow is antegrade.   CXR  08/14/2012   No acute cardiopulmonary disease.     EKG  unchanged from previous tracings, normal sinus rhythm.   Therapy Recommendations PT - CIR  Physical Exam   Pleasant middle-aged [place] American male currently not in distress.Awake alert. Afebrile. Head is nontraumatic. Neck is supple without bruit. Hearing is normal. Cardiac exam no murmur or gallop. Lungs are clear to auscultation. Distal pulses are well felt.  Neurological Exam : awake alert oriented to time place and person. Severe dysarthria but can be understood. No aphasia. Extraocular moments are full range though there is slight a right gaze preference. He blinks to threat bilaterally. Fundi were not visualized. Vision acuity appears normal. There is moderate left lower facial weakness. Tongue is midline. Cough and gag a week. He has been slept hemiplegia with grade 2/5 strength in the left upper extremity and 1/5 strength in the left lower extremity. Sensation is impaired on the left side. Coordination and gait could not be tested. Left plantar is upgoing. Right is  downgoing.    ASSESSMENT [name] is a 61 y.o. male presenting with weakness and numbness involving his left side. MRI confirms a right thalamic/posterior limb right internal  capsule infarct. Infarct secondary to small vessel disease. On clopidogrel 75 mg orally every day prior to admission. Now on clopidogrel 75 mg orally every day for secondary stroke prevention. Patient with resultant left hemiparesis, left hemisensory deficit. Inpatient rehab recommended.   Left shoulder, leg and rib shooting pain, burning secondary to existing cervical spine disease aggravated by bedrest. neurontin is helping pain  CAD - stent 2001, 2004; angioplasty 2005  Diabetes, HgbA1c 9.4  Hyperlipidemia, LDL 26, on pravachol  40 PTA, on simva 20 here. Decreased statin by 50% given low LDL Hypertension. Resumed home meds. Pt states lisinopril was stopped during last admission due to cough. TIA 02/2012, MRI negative for acute stroke   Laryngoplasty 08/07/2012  Hypokalemia, resolved, K 3.7  Hospital day # 4  TREATMENT/PLAN  Medically ready for transfer to rehab when bed available Consider ACCELERATE trial, use of Cholesteryl Ester Transfer Protein (CETP) Inhibitor: Potential of Evacetrapib to treat atherosclerosis and CAD. [facility] will contact patient with information about trial, screen for inclusion. D/W patient and wife at bedside  Burnetta Sabin, MSN, RN, ANVP-BC, ANP-BC, GNP-BC [facility] Pager: [phone] 08/18/2012 8:26 AM  Scribe for Dr. Antony Contras, [place] Director, who has personally reviewed chart, pertinent data, examined the patient and developed the plan of care. Pager:  [phone]

## 2012-08-18 NOTE — PMR Pre-admission (Signed)
PMR Admission Coordinator Pre-Admission Assessment  Patient: [name] is an 61 y.o., male MRN: [id number] DOB: 11/21/1951 Height: 5\' 11"  (180.3 cm) Weight: 89.223 kg (196 lb 11.2 oz)  Insurance Information HMO:     PPO:       PCP:       IPA:       80/20: yes     OTHER:   PRIMARY: Medicare      Policy#: [id number]      Subscriber: pt CM Name:        Phone#:       Fax#:   Pre-Cert#:        Employer:  retired Benefits:  Phone #: [place]     Name:   [place]. 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 RH-male with a history of TIA, diabetes mellitus, hyperlipidemia, recent laryngoplasty 9/16 who presented with acute recurrent weakness and numbness involving his left side with facial weakness on 08/14/12 had a TIA with similar presentation in May 2013. Workup was unremarkable and patient placed on plavix for CVA prophylaxis. CT head negative. 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 2D echo done revealing EF 50-55% with grade 1 diastolic dysfunction. Carotid dopplers without ICA stenosis. Patient had worsening of symptoms past admission. 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 FEES done (patient aspirating before) and placed on D2, thin  liquids with hard swallow.   Total: 8     Past Medical History  Past Medical History  Diagnosis Date  . ALLERGIC RHINITIS   . CAD, NATIVE VESSEL     BMS to OM1 2001, DES to BMS 2005  . DIAB W/UNSPEC COMP TYPE II/UNSPEC TYPE UNCNTRL   . ERECTILE DYSFUNCTION   . HYPERLIPIDEMIA-MIXED     takes Lipitor daily  . MORTON'S NEUROMA, RIGHT   . SHOULDER PAIN, RIGHT   . PONV (postoperative nausea and vomiting)   . HYPERTENSION, BENIGN     takes Lisinopril and Atenolol daily  . Coughing   . Shortness of breath     lying/sitting/exertion  . Headache     occasionally  . Peripheral neuropathy   . Arthritis   . Joint pain   . Joint swelling   . Chronic back pain     herniated disc  . GERD     takes Protonix daily  . Constipation     takes Carafate four times day  . Seasonal allergies     takes Allegra and Benadryl daily prn;uses Flonase daily  . Diabetes mellitus     on insulin  . Insomnia  doesn't require meds  . TIA on medication 02/2012    takes Plavix daily;on hold for surgery;rt sided weakness    Family History  family history includes Stroke in an unspecified family member.  Prior Rehab/Hospitalizations:   Current Medications  Current facility-administered medications:acetaminophen (TYLENOL) tablet 650 mg, 650 mg, Oral, Q6H PRN, Alexis Goodell, MD, 650 mg at 08/16/12 2214;  atenolol (TENORMIN) tablet 25 mg, 25 mg, Oral, Daily, Donzetta Starch, NP, 25 mg at 08/18/12 1101;  clopidogrel (PLAVIX) tablet 75 mg, 75 mg, Oral, Daily, Donzetta Starch, NP, 75 mg at 08/18/12 1102 enoxaparin (LOVENOX) injection 40 mg, 40 mg, Subcutaneous, Q24H, Garvin Fila, MD, 40 mg at 08/18/12 1108;  feeding supplement (GLUCERNA SHAKE) liquid 237 mL, 237 mL, Oral, Q1500, Rogue Bussing, RD, 237 mL at 08/17/12 1600;  fluticasone (FLONASE) 50 MCG/ACT nasal spray 2 spray, 2 spray, Each Nare, Daily, Donzetta Starch, NP, 2 spray at 08/17/12 1600 gabapentin (NEURONTIN) capsule 100 mg, 100 mg, Oral,  TID, Garvin Fila, MD, 100 mg at 08/18/12 1102;  gabapentin (NEURONTIN) capsule 300 mg, 300 mg, Oral, TID, Garvin Fila, MD;  hydrochlorothiazide (MICROZIDE) capsule 12.5 mg, 12.5 mg, Oral, Daily, Donzetta Starch, NP, 12.5 mg at 08/18/12 1101;  insulin aspart (novoLOG) injection 0-15 Units, 0-15 Units, Subcutaneous, TID AC & HS, Alexis Goodell, MD, 3 Units at 08/18/12 0756 labetalol (NORMODYNE,TRANDATE) injection 20 mg, 20 mg, Intravenous, Q10 min PRN, Juanito Doom, MD;  lisinopril (PRINIVIL,ZESTRIL) tablet 2.5 mg, 2.5 mg, Oral, Daily, Rush Farmer, MD, 2.5 mg at 08/18/12 1102;  ondansetron (ZOFRAN) injection 4 mg, 4 mg, Intravenous, Q6H PRN, Wallie Char, 4 mg at 08/16/12 1026;  pantoprazole (PROTONIX) EC tablet 40 mg, 40 mg, Oral, BID, Donzetta Starch, NP, 40 mg at 08/18/12 1102 senna-docusate (Senokot-S) tablet 1 tablet, 1 tablet, Oral, QHS PRN, Wallie Char;  simvastatin (ZOCOR) tablet 10 mg, 10 mg, Oral, q1800, Garvin Fila, MD, 10 mg at 08/17/12 1600;  traMADol (ULTRAM) tablet 50 mg, 50 mg, Oral, Q6H PRN, Alexis Goodell, MD, 50 mg at 08/17/12 0111  Patients Current Diet: Dysphagia  Precautions / Restrictions Precautions Precautions: Fall Restrictions Weight Bearing Restrictions: No   Prior Activity Level  community Development worker, international aid / [place] Devices/Equipment: Cane (specify quad or straight);Walker (specify type) Home Adaptive Equipment: None  Prior Functional Level Prior Function Level of Independence: Independent Able to Take Stairs?: Yes Driving: Yes Vocation: Retired  Current Functional Level Cognition  Arousal/Alertness: Awake/alert Overall Cognitive Status: Appears within functional limits for tasks assessed/performed Orientation Level: Oriented X4 Cognition - Other Comments: wife stated that pt with increased depression    Extremity Assessment (includes Sensation/Coordination)  RUE ROM/Strength/Tone: Within functional levels RUE  Sensation: WFL - Light Touch RUE Coordination: WFL - gross/fine motor  RLE ROM/Strength/Tone: Within functional levels RLE Sensation: WFL - Light Touch RLE Coordination: WFL - gross/fine motor    ADLs  Eating/Feeding: Simulated;Set up Where Assessed - Eating/Feeding: Chair Grooming: Simulated;Minimal assistance Where Assessed - Grooming: Unsupported sitting Upper Body Bathing: Simulated;Minimal assistance Where Assessed - Upper Body Bathing: Unsupported standing Lower Body Bathing: Simulated;Moderate assistance Where Assessed - Lower Body Bathing: Supported sit to stand Upper Body Dressing: Simulated;Moderate assistance Where Assessed - Upper Body Dressing: Unsupported sitting Lower Body Dressing: Performed;Moderate assistance Where Assessed - Lower Body Dressing: Supported sit to stand Toilet Transfer: Simulated;+2 Total assistance Toilet Transfer: Patient Percentage: 60% Armed forces technical officer Method: Stand Ecologist:  (Bed to recliner going to  pt right) Toileting - Clothing Manipulation and Hygiene: Performed;+1 Total assistance Where Assessed - Toileting Clothing Manipulation and Hygiene: Standing Equipment Used: Gait belt Transfers/Ambulation Related to ADLs: Mod A for sit to stand and stand to sit from bed    Mobility  Bed Mobility: Supine to Sit;Sitting - Scoot to Edge of Bed Supine to Sit: 4: Min assist Sitting - Scoot to [employer] of Bed: 4: Min guard    Transfers  Transfers: Sit to Stand;Stand to Sit Sit to Stand: 3: Mod assist;With upper extremity assist;From bed Stand to Sit: 3: Mod assist;With upper extremity assist;To chair/3-in-1 Stand Pivot Transfers: 1: +2 Total assist Stand Pivot Transfers: Patient Percentage: 40%    Ambulation / Gait / Stairs / Emergency planning/management officer  Ambulation/Gait Ambulation/Gait Assistance: Not tested (comment) Stairs: No Wheelchair Mobility Wheelchair Mobility: No    Posture / Chiropodist Sitting  - Balance Support: Right upper extremity supported;Feet supported Static Sitting - Level of Assistance: 4: Min assist Static Sitting - Comment/# of Minutes: Pt with increased R lateral lean while sitting with back supported in recliner. VC as well as visual cues through the mirror to increase proprioception during sitting to find center of gravity.      Previous Home Environment Living Arrangements: Spouse/significant other Lives With: Spouse;Daughter (wife's sister and brother-in-law) Available Help at Discharge: Family;Available 24 hours/day Type of Home: House Home Layout: Two level (has elevator to second floor) Home Access: Stairs to enter Entrance Stairs-Rails: None Entrance Stairs-Number of Steps: 1 Bathroom Shower/Tub: Walk-in shower;Door Bathroom Toilet: Handicapped height Bathroom Accessibility: Yes How Accessible: Accessible via wheelchair;Accessible via walker Home Care Services: No  Discharge Living Setting Plans for Discharge Living Setting: Patient's home Do you have any problems obtaining your medications?: No  Social/Family/Support Systems Patient Roles: Parent Contact Information: [phone] Anticipated Caregiver: wife, various family members Anticipated Caregiver's Contact Information: wife, Theophilus Kinds [phone] Ability/Limitations of Caregiver: wife has back issues Caregiver Availability: 24/7 Discharge Plan Discussed with Primary Caregiver: Yes Is Caregiver In Agreement with Plan?: Yes Does Caregiver/Family have Issues with Lodging/Transportation while Pt is in Rehab?: No  Goals/Additional Needs Patient/Family Goal for Rehab: Mod I-S Expected length of stay: 10-14 days Pt/Family Agrees to Admission and willing to participate: Yes.  Wife & pt are separated, but, wife is supportive. Program Orientation Provided & Reviewed with Pt/Caregiver Including Roles  & Responsibilities: Yes  Patient Condition: Please see physician update to information in consult  dated 08/16/12.  Preadmission Screen Completed By:  Theora Master, 08/18/2012 12:25 PM ______________________________________________________________________   Discussed status with Dr. Naaman Plummer on 08/18/12 at 11:15 and received telephone approval for admission today.  Admission Coordinator:  Theora Master, time 11:15/Date 08/18/12

## 2012-08-18 NOTE — Progress Notes (Signed)
Speech Language Pathology Dysphagia Treatment Patient Details Name: [name] MRN: [id number] DOB: [birth year] Today's Date: 08/18/2012 Time: [id number] SLP Time Calculation (min): 15 min  Assessment / Plan / Recommendation Clinical Impression  Treatment focused on diet tolerance and potential to advance per patient request. Swallowing function continues to improve with what appears to be full airway protection at bedside despite continued indication of pharyngeal residuals as evidenced by multiple swallows per bite and sip. No increased difficulty observed with trials of upgraded solids. Encouraged continued use of current aspiration precautions and compensatory strategies even though swallowing appears to be improving at bedside, likely continues to present with residual dysphagia. Patient planning to d/c to CIR today. Contacted CIR SLP and recommended admitting diet be changed to dysphagia 3, thin liquids. SLP to continue to f/u to ensure tolerance.     Diet Recommendation  Initiate / Change Diet: Dysphagia 3 (mechanical soft);Thin liquid    SLP Plan Continue with current plan of care   Pertinent Vitals/Pain n/a   Swallowing Goals  SLP Swallowing Goals Patient will utilize recommended strategies during swallow to increase swallowing safety with: Modified independent assistance Swallow Study Goal #2 - Progress: Progressing toward goal  General Temperature Spikes Noted: No Respiratory Status: Room air Behavior/Cognition: Alert;Cooperative Oral Cavity - Dentition: Adequate natural dentition Patient Positioning: Upright in bed   Dysphagia Treatment Treatment focused on: Skilled observation of diet tolerance;Utilization of compensatory strategies;Patient/family/caregiver education;Upgraded PO texture trials Family/Caregiver Educated: spouse Treatment Methods/Modalities: Skilled observation;Differential diagnosis Patient observed directly with PO's: Yes Type of PO's observed:  Dysphagia 1 (puree);Dysphagia 3 (soft);Dysphagia 2 (chopped);Thin liquids Feeding: Able to feed self Liquids provided via: Cup Pharyngeal Phase Signs & Symptoms: Multiple swallows Type of cueing: Verbal Amount of cueing: Minimal   GO   Gabriel Rainwater MA, CCC-SLP [phone]   Jacqui Headen Meryl 08/18/2012, 1:28 PM

## 2012-08-18 NOTE — Progress Notes (Signed)
Patient given Stroke Education manual. Pt and wife taught about S/S of stroke, risk factors and stroke prevention. All questions answered to patient's satisfaction. Pt moved to CIR with no signs of acute distress.

## 2012-08-18 NOTE — Progress Notes (Signed)
Met w/pt & his wife to discuss CIR.  They both want pt to come to CIR.  Bed available.  Contacted Burnetta Sabin, Neurology for d/c to CIR today.  [phone]

## 2012-08-18 NOTE — Plan of Care (Signed)
Overall Plan of Care [facility]) Patient Details Name: [name] MRN: [id number] DOB: 1950/11/30  Diagnosis:  Rehabilitation for left spastic hemiplegia  Primary Diagnosis:    Right thalamic and right posterior limb of the internal capsule infarct Co-morbidities: ALLERGIC RHINITIS  .  CAD, NATIVE VESSEL  BMS to OM1 2001, DES to BMS 2005  .  DIAB W/UNSPEC COMP TYPE II/UNSPEC TYPE UNCNTRL  .  ERECTILE DYSFUNCTION  .  HYPERLIPIDEMIA-MIXED  takes Lipitor daily  .  MORTON'S NEUROMA, RIGHT  .  SHOULDER PAIN, RIGHT  .  PONV (postoperative nausea and vomiting)  .  HYPERTENSION, BENIGN  takes Lisinopril and Atenolol daily  .  Coughing  .  Shortness of breath  lying/sitting/exertion  .  Headache  occasionally  .  Peripheral neuropathy  .  Arthritis  .  Joint pain  .  Joint swelling  .  Chronic back pain  herniated disc  .  GERD  takes Protonix daily  .  Constipation  takes Carafate four times day  .  Seasonal allergies  takes Allegra and Benadryl daily prn;uses Flonase daily  .  Diabetes mellitus  on insulin  .  Insomnia  doesn't require meds  .  TIA on medication    Functional Problem List  Patient demonstrates impairments in the following areas: Balance, Bowel, Medication Management, Nutrition, Pain, Safety and Sensory   Basic ADL's: eating, grooming, bathing, dressing and toileting Advanced ADL's: simple meal preparation  Transfers:  bed mobility, bed to chair, toilet, tub/shower, car and floor Locomotion:  ambulation, wheelchair mobility and stairs  Additional Impairments:  Functional use of upper extremity  Anticipated Outcomes Item Anticipated Outcome  Eating/Swallowing  Independence  Basic self-care  Min assist  Tolieting  Min assist  Bowel/Bladder  continent of bowel and bladder with minimal assist  Transfers  Supervision  Locomotion  Min assist/Supervision  Communication  Mod I  Cognition  Mod I  Pain  3 or less    Safety/Judgment  Mod I  Other     Therapy Plan: PT Frequency: 2-3 X/day, 60-90 minutes OT Frequency: 1-2 X/day, 60-90 minutes ST frequency 1-2 x/day, 60-90 minutes     Team Interventions: Item RN PT OT SLP SW TR Other  Self Care/Advanced ADL Retraining   x      Neuromuscular Re-Education  x x      Therapeutic Activities  x x      UE/LE Strength Training/ROM  x x      UE/LE Coordination Activities  x x      Visual/Perceptual Remediation/Compensation         DME/Adaptive Equipment Instruction  x x      Therapeutic Exercise  x x      Balance/Vestibular Training  x x      Patient/Family Education x x x      Cognitive Remediation/Compensation  x x x     Functional Mobility Training  x x      Ambulation/Gait Training  x       Stair Training  x       Wheelchair Propulsion/Positioning  x       Functional Electrical Stimulation  x x      Community Reintegration  x x      Dysphagia/Aspiration Precaution Training x   x     Speech/Language Facilitation x   x     Bladder Management         Bowel Management x  Disease Management/Prevention x        Pain Management  x x      Medication Management x        Skin Care/Wound Management  x       Splinting/Orthotics  x x      Discharge Planning  x x      Psychosocial Support  x x                         Team Discharge Planning: Destination:  Home Projected Follow-up:  PT, OT and Home Health vs. Outpatient pending progress Projected Equipment Needs:  Walker and Wheelchair 3:1 shower bench Patient/family involved in discharge planning:  Yes  MD ELOS: 3 weeks Medical Rehab Prognosis:  Good Assessment: 61 year old male in today with left hemiparesis. Now requiring CIR level PT, OT, SLP as well as 24 7 rehabilitation RN and M.D. For stroke rehabilitation

## 2012-08-18 NOTE — H&P (Signed)
Physical Medicine and Rehabilitation Admission H&P    Chief Complaint  Patient presents with  . Left sided weakness, left facial droop.  : HPI: [name] is a 61 y.o. RH-male with a history of TIA, diabetes mellitus, hyperlipidemia, recent laryngoplasty 9/16 who presented with acute recurrent weakness and numbness involving his left side with facial weakness on 08/14/12 had a TIA with similar presentation in May 2013. Workup was unremarkable and patient placed on plavix for CVA prophylaxis. CT head negative. 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 2D echo done revealing EF 50-55% with grade 1 diastolic dysfunction. Carotid dopplers without ICA stenosis. Patient had worsening of symptoms past admission. 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 FEES done (patient has had problems with aspiration) and placed on D2, thin liquids with hard swallow. He is developing left sided spasms. 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 Continues with left hemiparesis with dysphagia and working on pre-gait activities. PM&R evaluated this patient per request of the neuro team and felt he was appropriate for inpatient rehab and ultimately he was admitted today.      Review of Systems  Eyes: Negative for blurred vision and double vision.  Respiratory: Positive for cough but this is imoproving. Negative for wheezing.  Cardiovascular: Negative for chest pain and palpitations.  Gastrointestinal: Positive for nausea and constipation (chronic). Negative for abdominal pain.  Genitourinary: Negative for urgency and frequency.  Musculoskeletal: Positive for left chest wall pain. Has chronic low back pain also (chronic due to injury with numbness Left knee down.) and joint pain (chronic eft knee pain).  Neurological: Positive for sensory change, speech change,  focal weakness and headaches (frontal/orbital headaches).        Past Medical History  Diagnosis Date  . ALLERGIC RHINITIS   . CAD, NATIVE VESSEL     BMS to OM1 2001, DES to BMS 2005  . DIAB W/UNSPEC COMP TYPE II/UNSPEC TYPE UNCNTRL   . ERECTILE DYSFUNCTION   . HYPERLIPIDEMIA-MIXED     takes Lipitor daily  . MORTON'S NEUROMA, RIGHT   . SHOULDER PAIN, RIGHT   . PONV (postoperative nausea and vomiting)   . HYPERTENSION, BENIGN     takes Lisinopril and Atenolol daily  . Coughing   . Shortness of breath     lying/sitting/exertion  . Headache     occasionally  . Peripheral neuropathy   . Arthritis   . Joint pain   . Joint swelling   . Chronic back pain     herniated disc  . GERD     takes Protonix daily  . Constipation     takes Carafate four times day  . Seasonal allergies     takes Allegra and Benadryl daily prn;uses Flonase daily  . Diabetes mellitus     on insulin  . Insomnia     doesn't require meds  . TIA on medication 02/2012    takes Plavix daily;on hold for surgery;rt sided weakness   Past Surgical History  Procedure Date  . Stent 2001, 2004    coronary stents  . Angioplasty   . Left knee surgury     x 2   . Dental surgery   . Coronary angioplasty 2005    2 stents  . Colonoscopy   . Laryngoplasty 08/07/2012    Procedure: LARYNGOPLASTY;  Surgeon: Izora Gala, MD;  Location: [facility];  Service: ENT;  Laterality: Left;  Left Vocal Cord Medialyzation   Family History  Problem Relation Age of Onset  . Stroke     Social History: Married. 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 Was attending school at [employer]. He reports that he quit smoking about 29 years ago. He has never used smokeless tobacco. He reports that he does not drink alcohol or use illicit drugs    Allergies  Allergen Reactions  . Penicillins Anaphylaxis  . Pneumococcal Vaccines Anaphylaxis    Pt states allergic to it.  . Shellfish Allergy Anaphylaxis    Scheduled Meds:   .  atenolol  25 mg Oral Daily  . clopidogrel  75 mg Oral Daily  . enoxaparin (LOVENOX) injection  40 mg Subcutaneous Q24H  . feeding supplement  237 mL Oral Q1500  . fluticasone  2 spray Each Nare Daily  . gabapentin  100 mg Oral TID  . gabapentin  300 mg Oral TID  . hydrochlorothiazide  12.5 mg Oral Daily  . insulin aspart  0-15 Units Subcutaneous TID AC & HS  . lisinopril  2.5 mg Oral Daily  . pantoprazole  40 mg Oral BID  . simvastatin  10 mg Oral q1800   Continuous Infusions:  PRN Meds:. Medications Prior to Admission  Medication Sig Dispense Refill  . atenolol (TENORMIN) 25 MG tablet Take 25 mg by mouth daily.      . clopidogrel (PLAVIX) 75 MG tablet Take 75 mg by mouth daily.      . diphenhydrAMINE (BENADRYL) 25 MG tablet Take 25 mg by mouth every 6 (six) hours as needed. For allergies      . fexofenadine (ALLEGRA) 180 MG tablet Take 180 mg by mouth daily as needed. For allergies      . fluticasone (FLONASE) 50 MCG/ACT nasal spray Place 2 sprays into the nose daily.  16 g  2  . hydrochlorothiazide (MICROZIDE) 12.5 MG capsule Take 1 capsule (12.5 mg total) by mouth daily.  30 capsule  2  . HYDROcodone-acetaminophen (LORTAB 7.5) 7.5-500 MG per tablet Take 1 tablet by mouth every 6 (six) hours as needed for pain.  30 tablet  0  . insulin aspart (NOVOLOG) 100 UNIT/ML injection Inject 2-4 Units into the skin 3 (three) times daily before meals. 4 units in the morning and at night, and 2 units at lunch.      . lisinopril (PRINIVIL,ZESTRIL) 40 MG tablet Take 1 tablet (40 mg total) by mouth daily.  30 tablet  4  . nitroGLYCERIN (NITROSTAT) 0.4 MG SL tablet Place 0.4 mg under the tongue every 5 (five) minutes as needed. For chest pain      . pantoprazole (PROTONIX) 40 MG tablet Take 1 tablet (40 mg total) by mouth 2 (two) times daily.  60 tablet  3  . pravastatin (PRAVACHOL) 40 MG tablet Take 40 mg by mouth at bedtime.       . promethazine (PHENERGAN) 25 MG suppository Place 1 suppository (25  mg total) rectally every 6 (six) hours as needed for nausea.  12 each  0  . sucralfate (CARAFATE) 1 GM/10ML suspension Take 10 mLs (1 g total) by mouth 4 (four) times daily.  420 mL  0    Home: Home Living Lives With: Spouse;Daughter (wife's sister and brother-in-law) Available Help at Discharge: Family;Available 24 hours/day Type of Home: House Home Access: Stairs to enter [employer] of Steps: 1 Entrance Stairs-Rails: None Home Layout: Two level (has elevator to second floor) Bathroom Shower/Tub: Walk-in shower;Door  Bathroom Toilet: Handicapped height Bathroom Accessibility: Yes How Accessible: Accessible via wheelchair;Accessible via walker Home Adaptive Equipment: None   Functional History: Prior Function Able to Take Stairs?: Yes Driving: Yes Vocation: Retired  Functional Status:  Mobility: Bed Mobility Bed Mobility: Supine to Sit;Sitting - Scoot to Edge of Bed Supine to Sit: 4: Min assist Sitting - Scoot to [employer] of Bed: 4: Min guard Transfers Transfers: Sit to Stand;Stand to Sit Sit to Stand: 3: Mod assist;With upper extremity assist;From bed Stand to Sit: 3: Mod assist;With upper extremity assist;To chair/3-in-1 Stand Pivot Transfers: 1: +2 Total assist Stand Pivot Transfers: Patient Percentage: 40% Ambulation/Gait Ambulation/Gait Assistance: Not tested (comment) Stairs: No Wheelchair Mobility Wheelchair Mobility: No  ADL: ADL Eating/Feeding: Simulated;Set up Where Assessed - Eating/Feeding: Chair Grooming: Simulated;Minimal assistance Where Assessed - Grooming: Unsupported sitting Upper Body Bathing: Simulated;Minimal assistance Where Assessed - Upper Body Bathing: Unsupported standing Lower Body Bathing: Simulated;Moderate assistance Where Assessed - Lower Body Bathing: Supported sit to stand Upper Body Dressing: Simulated;Moderate assistance Where Assessed - Upper Body Dressing: Unsupported sitting Lower Body Dressing: Performed;Moderate  assistance Where Assessed - Lower Body Dressing: Supported sit to stand Toilet Transfer: Haematologist Method: Arts development officer:  (Bed to recliner going to pt right) Equipment Used: Gait belt Transfers/Ambulation Related to ADLs: Mod A for sit to stand and stand to sit from bed  Cognition: Cognition Arousal/Alertness: Awake/alert Orientation Level: Oriented X4 Cognition Overall Cognitive Status: Appears within functional limits for tasks assessed/performed Arousal/Alertness: Awake/alert Orientation Level: Appears intact for tasks assessed Behavior During Session: [facility] for tasks performed Cognition - Other Comments: wife stated that pt with increased depression   Blood pressure 148/90, pulse 72, temperature 98.3 F (36.8 C), temperature source Oral, resp. rate 18, height 5\' 11"  (1.803 m), weight 89.223 kg (196 lb 11.2 oz), SpO2 100.00%.  Physical Exam  Nursing note and vitals reviewed.  Constitutional: He is oriented to person, place, and time. He appears well-developed and well-nourished.  HENT:  Head: Normocephalic. He has thrush over the tongue  Eyes: Pupils are equal, round, and reactive to light.  Cardiovascular: Normal rate and regular rhythm. No murmurs Pulmonary/Chest: Effort normal and breath sounds normal. No wheezes rales or rhonchi Abdominal: Soft. Bowel sounds are normal. He exhibits no distension.  Musculoskeletal: He exhibits no edema. Mild left chest wall pain.  Neurological: He is alert and oriented to person, place, and time.  Left facial weakness. Mild dysarthria with dysphonia and intermittent cough noted. Mild left inattention with tendency to slump to the right. Left hemiparesis UE>LE. Left hemisensory deficits from face to toe. LUE is grossly 3 to 3+ /5, he is 3- to 3/5 LLE. Cognitively quite appropriate. DTR's are 1+  Skin: Skin is warm and dry     GLUCOSE, CAPILLARY     Status: Abnormal   Collection  Time   08/17/12  4:45 PM      Component Value Range Comment   Glucose-Capillary 232 (*) 70 - 99 mg/dL   GLUCOSE, CAPILLARY     Status: Abnormal   Collection Time   08/17/12  9:37 PM      Component Value Range Comment   Glucose-Capillary 151 (*) 70 - 99 mg/dL   CBC     Status: Abnormal   Collection Time   08/18/12  5:25 AM      Component Value Range Comment   WBC 6.4  4.0 - 10.5 K/uL    RBC 4.10 (*) 4.22 - 5.81 MIL/uL  Hemoglobin 12.7 (*) 13.0 - 17.0 g/dL    HCT 36.2 (*) 39.0 - 52.0 %    MCV 88.3  78.0 - 100.0 fL    MCH 31.0  26.0 - 34.0 pg    MCHC 35.1  30.0 - 36.0 g/dL    RDW 12.2  11.5 - 15.5 %    Platelets 218  150 - 400 K/uL   GLUCOSE, CAPILLARY     Status: Abnormal   Collection Time   08/18/12  6:21 AM      Component Value Range Comment   Glucose-Capillary 197 (*) 70 - 99 mg/dL    No results found.  Post Admission Physician Evaluation: 1. 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 2. Patient is admitted to receive collaborative, interdisciplinary care between the physiatrist, rehab nursing staff, and therapy team. 3. Patient's level of medical complexity and substantial therapy needs in context of that medical necessity cannot be provided at a lesser intensity of care such as a SNF. 4. Patient has experienced substantial functional loss from his/her baseline which was documented above under the "Functional History" and "Functional Status" headings.  Judging by the patient's diagnosis, physical exam, and functional history, the patient has potential for functional progress which will result in measurable gains while on inpatient rehab.  These gains will be of substantial and practical use upon discharge  in facilitating mobility and self-care at the household level. 5. Physiatrist will provide 24 hour management of medical needs as well as oversight of the therapy plan/treatment and provide guidance as appropriate regarding the interaction of the  two. 6. 24 hour rehab nursing will assist with bladder management, bowel management, safety, skin/wound care, disease management, medication administration, pain management and patient education  and help integrate therapy concepts, techniques,education, etc. 7. PT will assess and treat for:  fxnl mobility, strength, NMR, adaptive equipment, safety.  Goals are: sup to mod I. 8. OT will assess and treat for: fxnl mobility, ADL's, NMR, adaptive equipment and technique, family ed.   Goals are: supervision to Mod I. 9. SLP will assess and treat for: speech, communication, swallowing.  Goals are: mod I. 10. Case Management and Social Worker will assess and treat for psychological issues and discharge planning. 11. Team conference will be held weekly to assess progress toward goals and to determine barriers to discharge. 12. Patient will receive at least 3 hours of therapy per day at least 5 days per week. 13. ELOS: 2 weeks      Prognosis:  excellent   Medical Problem List and Plan: 1. DVT Prophylaxis/Anticoagulation: Pharmaceutical: Lovenox 2. Pain Management: tramadol, tylenol. May utilize heat or ice for rib cage discomfort  -neurontin added for ?radicular C-spine pain per neuro- observe for effect, titrate as appropriate. His presentation however is not consistent with a radiculopathy in my opinion 3. Mood: egosupport by team. He is fairly motivated and seems to be in good spirits 4. Neuropsych: This patient is capable of making decisions on his/her own behalf. 5. HTN: Monitor with bid checks. Avoid hypotension for now. Continue atenolol, HCTZ and lisinopril.  6. Constipation (acute on chronic): scheduled softener/laxative, suppository or enema if needed 7. Thrush: nystatin mouth wash  8. Hyperlipidemia: zocor 10mg  daily   08/18/2012, Oval Linsey, MD

## 2012-08-18 NOTE — Progress Notes (Signed)
Patient information reviewed and entered into [facility] system by Moya Duan, RN, CRRN, PPS Coordinator.  Information including medical coding and functional independence measure will be reviewed and updated through discharge.     Per nursing patient was given "Data Collection Information Summary for Patients in Inpatient Rehabilitation Facilities with attached "Privacy Act Statement-Health Care Records" upon admission.   

## 2012-08-18 NOTE — Progress Notes (Signed)
Physical Therapy Treatment Patient Details Name: [name] MRN: [id number] DOB: 1951/02/17 Today's Date: 08/18/2012 Time: [id number] PT Time Calculation (min): 23 min  PT Assessment / Plan / Recommendation Comments on Treatment Session  Pt able to ambulate this session, increased LLE strength. Pt with ataxic movements requiring physical assist to control L steps and control of both knee flexion and extension. Continue per plan. Plan to go to CIR today.     Follow Up Recommendations  Inpatient Rehab    Barriers to Discharge        Equipment Recommendations  3 in 1 bedside comode    Recommendations for Other Services Rehab consult  Frequency Min 4X/week   Plan Discharge plan remains appropriate;Frequency remains appropriate    Precautions / Restrictions Precautions Precautions: Fall Restrictions Weight Bearing Restrictions: No   Pertinent Vitals/Pain Pt with pain complaints of 4/10 in L knee with ambulation    Mobility  Bed Mobility Bed Mobility: Supine to Sit;Sitting - Scoot to Edge of Bed;Sit to Supine Supine to Sit: 4: Min assist Sitting - Scoot to Edge of Bed: 4: Min guard Sit to Supine: 4: Min guard Details for Bed Mobility Assistance: VC for proper sequencing and hand placement. Pt with min assist of LLE getting it completely out of bed. Pt uses RLE to assist with LLE back into bed. Cueing for safety Transfers Transfers: Sit to Stand;Stand to Sit;Stand Pivot Transfers Sit to Stand: 4: Min assist;With upper extremity assist;From bed;From chair/3-in-1 Stand to Sit: 4: Min assist;With upper extremity assist;To chair/3-in-1 Stand Pivot Transfers: 1: +2 Total assist Stand Pivot Transfers: Patient Percentage: 60% Details for Transfer Assistance: Assist for anterior translation into standing with cueing for hand placement for safety. Manual facilitation through LLE to maintain knee control during sitting and standing, increased control for descent into chair.    Ambulation/Gait Ambulation/Gait Assistance: 1: +2 Total assist Ambulation/Gait: Patient Percentage: 40% Ambulation Distance (Feet): 10 Feet (three trials; 5,5, 10) Assistive device: Rolling walker;Hemi-walker Ambulation/Gait Assistance Details: Attempted ambulation with RW, pt unable to grip RW for longer distance therefore attempted hemiwalker for 2nd and 3rd trial. Pt with ataxic gait, difficulty with placement of RLE. Manual facilitation throughout ambulation for safe placement of RLE, controlled steps as well as control for knee to prevent buckling and hyperextension. Assist x 2 for stability and balance as pt with anterior L lean during stance of LLE.  Gait Pattern: Ataxic;Scissoring;Narrow base of support;Trunk flexed;Trunk rotated posteriorly on left;Lateral trunk lean to left;Decreased hip/knee flexion - left;Decreased stance time - left;Decreased step length - right;Step-to pattern;Decreased weight shift to left (left ankle inversion) Gait velocity: decreased gait speed Modified Rankin (Stroke Patients Only) Pre-Morbid Rankin Score: No symptoms Modified Rankin: Moderately severe disability    Exercises     PT Diagnosis:    PT Problem List:   PT Treatment Interventions:     PT Goals Acute Rehab PT Goals PT Goal: Sit to Stand - Progress: Met PT Goal: Stand to Sit - Progress: Met PT Transfer Goal: Bed to Chair/Chair to Bed - Progress: Met PT Goal: Ambulate - Progress: Progressing toward goal  Visit Information  Last PT Received On: 08/18/12 Assistance Needed: +2    Subjective Data      Cognition  Overall Cognitive Status: Appears within functional limits for tasks assessed/performed Arousal/Alertness: Awake/alert Orientation Level: Appears intact for tasks assessed Behavior During Session: [facility] for tasks performed    Balance     End of Session PT - End of Session Equipment Utilized During  Treatment: Gait belt Activity Tolerance: Patient tolerated treatment  well Patient left: in bed;with call bell/phone within reach;with family/visitor present Nurse Communication: Mobility status   GP     Ambrose Finland 08/18/2012, 4:21 PM

## 2012-08-18 NOTE — Interval H&P Note (Signed)
[name] was admitted today to Inpatient Rehabilitation with the diagnosis of thrombotic right thalamic/ PLIC infarct.  The patient's history has been reviewed, patient examined, and there is no change in status.  Patient continues to be appropriate for intensive inpatient rehabilitation.  I have reviewed the patient's chart and labs.  Questions were answered to the patient's satisfaction.  SWARTZ,ZACHARY T 08/18/2012, 11:05 PM

## 2012-08-18 NOTE — Progress Notes (Signed)
Pt admitted to rehab unit at 1615. Family at bedside. Rehab booklet given to pt and reviewed process and material. Safety plan signed and questions answered at this time. Cont. To monitor pt.

## 2012-08-18 NOTE — Care Management Note (Signed)
    Page 1 of 1   08/18/2012     4:16:44 PM   CARE MANAGEMENT NOTE 08/18/2012  Patient:  [name], [name]   Account Number:  [id number]  Date Initiated:  08/16/2012  Documentation initiated by:  Full[place] Plan  Subjective/Objective Assessment:   Admitted with CVA     Action/Plan:   PT/OT evals  inpatient rehab consult   Anticipated DC Date:  08/19/2012   Anticipated DC Plan:  IP REHAB FACILITY      DC Planning Services  CM consult      Choice offered to / List presented to:             Status of service:  Completed, signed off Medicare Important Message given?   (If response is "NO", the following Medicare IM given date fields will be blank) Date Medicare IM given:   Date Additional Medicare IM given:    Discharge Disposition:  [facility] REHAB FACILITY  Per UR Regulation:  Reviewed for med. necessity/level of care/duration of stay  If discussed at [place] of Stay Meetings, dates discussed:    Comments:

## 2012-08-18 NOTE — H&P (View-Only) (Signed)
Physical Medicine and Rehabilitation Admission H&P    Chief Complaint  Patient presents with  . Left sided weakness, left facial droop.  : HPI: [name] is a 61 y.o. RH-male with a history of TIA, diabetes mellitus, hyperlipidemia, recent laryngoplasty 9/16 who presented with acute recurrent weakness and numbness involving his left side with facial weakness on 08/14/12 had a TIA with similar presentation in May 2013. Workup was unremarkable and patient placed on plavix for CVA prophylaxis. CT head negative. 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 2D echo done revealing EF 50-55% with grade 1 diastolic dysfunction. Carotid dopplers without ICA stenosis. Patient had worsening of symptoms past admission. 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 FEES done (patient has had problems with aspiration) and placed on D2, thin liquids with hard swallow. He is developing left sided spasms. 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 Continues with left hemiparesis with dysphagia and working on pre-gait activities. PM&R evaluated this patient per request of the neuro team and felt he was appropriate for inpatient rehab and ultimately he was admitted today.      Review of Systems  Eyes: Negative for blurred vision and double vision.  Respiratory: Positive for cough but this is imoproving. Negative for wheezing.  Cardiovascular: Negative for chest pain and palpitations.  Gastrointestinal: Positive for nausea and constipation (chronic). Negative for abdominal pain.  Genitourinary: Negative for urgency and frequency.  Musculoskeletal: Positive for left chest wall pain. Has chronic low back pain also (chronic due to injury with numbness Left knee down.) and joint pain (chronic eft knee pain).  Neurological: Positive for sensory change, speech change,  focal weakness and headaches (frontal/orbital headaches).        Past Medical History  Diagnosis Date  . ALLERGIC RHINITIS   . CAD, NATIVE VESSEL     BMS to OM1 2001, DES to BMS 2005  . DIAB W/UNSPEC COMP TYPE II/UNSPEC TYPE UNCNTRL   . ERECTILE DYSFUNCTION   . HYPERLIPIDEMIA-MIXED     takes Lipitor daily  . MORTON'S NEUROMA, RIGHT   . SHOULDER PAIN, RIGHT   . PONV (postoperative nausea and vomiting)   . HYPERTENSION, BENIGN     takes Lisinopril and Atenolol daily  . Coughing   . Shortness of breath     lying/sitting/exertion  . Headache     occasionally  . Peripheral neuropathy   . Arthritis   . Joint pain   . Joint swelling   . Chronic back pain     herniated disc  . GERD     takes Protonix daily  . Constipation     takes Carafate four times day  . Seasonal allergies     takes Allegra and Benadryl daily prn;uses Flonase daily  . Diabetes mellitus     on insulin  . Insomnia     doesn't require meds  . TIA on medication 02/2012    takes Plavix daily;on hold for surgery;rt sided weakness   Past Surgical History  Procedure Date  . Stent 2001, 2004    coronary stents  . Angioplasty   . Left knee surgury     x 2   . Dental surgery   . Coronary angioplasty 2005    2 stents  . Colonoscopy   . Laryngoplasty 08/07/2012    Procedure: LARYNGOPLASTY;  Surgeon: Izora Gala, MD;  Location: [facility];  Service: ENT;  Laterality: Left;  Left Vocal Cord Medialyzation   Family History  Problem Relation Age of Onset  . Stroke     Social History: Married. 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 Was attending school at [employer]. He reports that he quit smoking about 29 years ago. He has never used smokeless tobacco. He reports that he does not drink alcohol or use illicit drugs    Allergies  Allergen Reactions  . Penicillins Anaphylaxis  . Pneumococcal Vaccines Anaphylaxis    Pt states allergic to it.  . Shellfish Allergy Anaphylaxis    Scheduled Meds:   .  atenolol  25 mg Oral Daily  . clopidogrel  75 mg Oral Daily  . enoxaparin (LOVENOX) injection  40 mg Subcutaneous Q24H  . feeding supplement  237 mL Oral Q1500  . fluticasone  2 spray Each Nare Daily  . gabapentin  100 mg Oral TID  . gabapentin  300 mg Oral TID  . hydrochlorothiazide  12.5 mg Oral Daily  . insulin aspart  0-15 Units Subcutaneous TID AC & HS  . lisinopril  2.5 mg Oral Daily  . pantoprazole  40 mg Oral BID  . simvastatin  10 mg Oral q1800   Continuous Infusions:  PRN Meds:. Medications Prior to Admission  Medication Sig Dispense Refill  . atenolol (TENORMIN) 25 MG tablet Take 25 mg by mouth daily.      . clopidogrel (PLAVIX) 75 MG tablet Take 75 mg by mouth daily.      . diphenhydrAMINE (BENADRYL) 25 MG tablet Take 25 mg by mouth every 6 (six) hours as needed. For allergies      . fexofenadine (ALLEGRA) 180 MG tablet Take 180 mg by mouth daily as needed. For allergies      . fluticasone (FLONASE) 50 MCG/ACT nasal spray Place 2 sprays into the nose daily.  16 g  2  . hydrochlorothiazide (MICROZIDE) 12.5 MG capsule Take 1 capsule (12.5 mg total) by mouth daily.  30 capsule  2  . HYDROcodone-acetaminophen (LORTAB 7.5) 7.5-500 MG per tablet Take 1 tablet by mouth every 6 (six) hours as needed for pain.  30 tablet  0  . insulin aspart (NOVOLOG) 100 UNIT/ML injection Inject 2-4 Units into the skin 3 (three) times daily before meals. 4 units in the morning and at night, and 2 units at lunch.      . lisinopril (PRINIVIL,ZESTRIL) 40 MG tablet Take 1 tablet (40 mg total) by mouth daily.  30 tablet  4  . nitroGLYCERIN (NITROSTAT) 0.4 MG SL tablet Place 0.4 mg under the tongue every 5 (five) minutes as needed. For chest pain      . pantoprazole (PROTONIX) 40 MG tablet Take 1 tablet (40 mg total) by mouth 2 (two) times daily.  60 tablet  3  . pravastatin (PRAVACHOL) 40 MG tablet Take 40 mg by mouth at bedtime.       . promethazine (PHENERGAN) 25 MG suppository Place 1 suppository (25  mg total) rectally every 6 (six) hours as needed for nausea.  12 each  0  . sucralfate (CARAFATE) 1 GM/10ML suspension Take 10 mLs (1 g total) by mouth 4 (four) times daily.  420 mL  0    Home: Home Living Lives With: Spouse;Daughter (wife's sister and brother-in-law) Available Help at Discharge: Family;Available 24 hours/day Type of Home: House Home Access: Stairs to enter [employer] of Steps: 1 Entrance Stairs-Rails: None Home Layout: Two level (has elevator to second floor) Bathroom Shower/Tub: Walk-in shower;Door  Bathroom Toilet: Handicapped height Bathroom Accessibility: Yes How Accessible: Accessible via wheelchair;Accessible via walker Home Adaptive Equipment: None   Functional History: Prior Function Able to Take Stairs?: Yes Driving: Yes Vocation: Retired  Functional Status:  Mobility: Bed Mobility Bed Mobility: Supine to Sit;Sitting - Scoot to Edge of Bed Supine to Sit: 4: Min assist Sitting - Scoot to [employer] of Bed: 4: Min guard Transfers Transfers: Sit to Stand;Stand to Sit Sit to Stand: 3: Mod assist;With upper extremity assist;From bed Stand to Sit: 3: Mod assist;With upper extremity assist;To chair/3-in-1 Stand Pivot Transfers: 1: +2 Total assist Stand Pivot Transfers: Patient Percentage: 40% Ambulation/Gait Ambulation/Gait Assistance: Not tested (comment) Stairs: No Wheelchair Mobility Wheelchair Mobility: No  ADL: ADL Eating/Feeding: Simulated;Set up Where Assessed - Eating/Feeding: Chair Grooming: Simulated;Minimal assistance Where Assessed - Grooming: Unsupported sitting Upper Body Bathing: Simulated;Minimal assistance Where Assessed - Upper Body Bathing: Unsupported standing Lower Body Bathing: Simulated;Moderate assistance Where Assessed - Lower Body Bathing: Supported sit to stand Upper Body Dressing: Simulated;Moderate assistance Where Assessed - Upper Body Dressing: Unsupported sitting Lower Body Dressing: Performed;Moderate  assistance Where Assessed - Lower Body Dressing: Supported sit to stand Toilet Transfer: Haematologist Method: Arts development officer:  (Bed to recliner going to pt right) Equipment Used: Gait belt Transfers/Ambulation Related to ADLs: Mod A for sit to stand and stand to sit from bed  Cognition: Cognition Arousal/Alertness: Awake/alert Orientation Level: Oriented X4 Cognition Overall Cognitive Status: Appears within functional limits for tasks assessed/performed Arousal/Alertness: Awake/alert Orientation Level: Appears intact for tasks assessed Behavior During Session: [facility] for tasks performed Cognition - Other Comments: wife stated that pt with increased depression   Blood pressure 148/90, pulse 72, temperature 98.3 F (36.8 C), temperature source Oral, resp. rate 18, height 5\' 11"  (1.803 m), weight 89.223 kg (196 lb 11.2 oz), SpO2 100.00%.  Physical Exam  Nursing note and vitals reviewed.  Constitutional: He is oriented to person, place, and time. He appears well-developed and well-nourished.  HENT:  Head: Normocephalic. He has thrush over the tongue  Eyes: Pupils are equal, round, and reactive to light.  Cardiovascular: Normal rate and regular rhythm. No murmurs Pulmonary/Chest: Effort normal and breath sounds normal. No wheezes rales or rhonchi Abdominal: Soft. Bowel sounds are normal. He exhibits no distension.  Musculoskeletal: He exhibits no edema. Mild left chest wall pain.  Neurological: He is alert and oriented to person, place, and time.  Left facial weakness. Mild dysarthria with dysphonia and intermittent cough noted. Mild left inattention with tendency to slump to the right. Left hemiparesis UE>LE. Left hemisensory deficits from face to toe. LUE is grossly 3 to 3+ /5, he is 3- to 3/5 LLE. Cognitively quite appropriate. DTR's are 1+  Skin: Skin is warm and dry     GLUCOSE, CAPILLARY     Status: Abnormal   Collection  Time   08/17/12  4:45 PM      Component Value Range Comment   Glucose-Capillary 232 (*) 70 - 99 mg/dL   GLUCOSE, CAPILLARY     Status: Abnormal   Collection Time   08/17/12  9:37 PM      Component Value Range Comment   Glucose-Capillary 151 (*) 70 - 99 mg/dL   CBC     Status: Abnormal   Collection Time   08/18/12  5:25 AM      Component Value Range Comment   WBC 6.4  4.0 - 10.5 K/uL    RBC 4.10 (*) 4.22 - 5.81 MIL/uL  Hemoglobin 12.7 (*) 13.0 - 17.0 g/dL    HCT 36.2 (*) 39.0 - 52.0 %    MCV 88.3  78.0 - 100.0 fL    MCH 31.0  26.0 - 34.0 pg    MCHC 35.1  30.0 - 36.0 g/dL    RDW 12.2  11.5 - 15.5 %    Platelets 218  150 - 400 K/uL   GLUCOSE, CAPILLARY     Status: Abnormal   Collection Time   08/18/12  6:21 AM      Component Value Range Comment   Glucose-Capillary 197 (*) 70 - 99 mg/dL    No results found.  Post Admission Physician Evaluation: 1. 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 2. Patient is admitted to receive collaborative, interdisciplinary care between the physiatrist, rehab nursing staff, and therapy team. 3. Patient's level of medical complexity and substantial therapy needs in context of that medical necessity cannot be provided at a lesser intensity of care such as a SNF. 4. Patient has experienced substantial functional loss from his/her baseline which was documented above under the "Functional History" and "Functional Status" headings.  Judging by the patient's diagnosis, physical exam, and functional history, the patient has potential for functional progress which will result in measurable gains while on inpatient rehab.  These gains will be of substantial and practical use upon discharge  in facilitating mobility and self-care at the household level. 5. Physiatrist will provide 24 hour management of medical needs as well as oversight of the therapy plan/treatment and provide guidance as appropriate regarding the interaction of the  two. 6. 24 hour rehab nursing will assist with bladder management, bowel management, safety, skin/wound care, disease management, medication administration, pain management and patient education  and help integrate therapy concepts, techniques,education, etc. 7. PT will assess and treat for:  fxnl mobility, strength, NMR, adaptive equipment, safety.  Goals are: sup to mod I. 8. OT will assess and treat for: fxnl mobility, ADL's, NMR, adaptive equipment and technique, family ed.   Goals are: supervision to Mod I. 9. SLP will assess and treat for: speech, communication, swallowing.  Goals are: mod I. 10. Case Management and Social Worker will assess and treat for psychological issues and discharge planning. 11. Team conference will be held weekly to assess progress toward goals and to determine barriers to discharge. 12. Patient will receive at least 3 hours of therapy per day at least 5 days per week. 13. ELOS: 2 weeks      Prognosis:  excellent   Medical Problem List and Plan: 1. DVT Prophylaxis/Anticoagulation: Pharmaceutical: Lovenox 2. Pain Management: tramadol, tylenol. May utilize heat or ice for rib cage discomfort  -neurontin added for ?radicular C-spine pain per neuro- observe for effect, titrate as appropriate. His presentation however is not consistent with a radiculopathy in my opinion 3. Mood: egosupport by team. He is fairly motivated and seems to be in good spirits 4. Neuropsych: This patient is capable of making decisions on his/her own behalf. 5. HTN: Monitor with bid checks. Avoid hypotension for now. Continue atenolol, HCTZ and lisinopril.  6. Constipation (acute on chronic): scheduled softener/laxative, suppository or enema if needed 7. Thrush: nystatin mouth wash  8. Hyperlipidemia: zocor 10mg  daily   08/18/2012, Oval Linsey, MD

## 2012-08-19 ENCOUNTER — Inpatient Hospital Stay (HOSPITAL_COMMUNITY): Payer: Medicare Other | Admitting: Occupational Therapy

## 2012-08-19 ENCOUNTER — Inpatient Hospital Stay (HOSPITAL_COMMUNITY): Payer: Medicare Other | Admitting: Speech Pathology

## 2012-08-19 ENCOUNTER — Inpatient Hospital Stay (HOSPITAL_COMMUNITY): Payer: Medicare Other | Admitting: Physical Therapy

## 2012-08-19 DIAGNOSIS — G8110 Spastic hemiplegia affecting unspecified side: Secondary | ICD-10-CM

## 2012-08-19 DIAGNOSIS — I69991 Dysphagia following unspecified cerebrovascular disease: Secondary | ICD-10-CM

## 2012-08-19 DIAGNOSIS — I6330 Cerebral infarction due to thrombosis of unspecified cerebral artery: Secondary | ICD-10-CM

## 2012-08-19 DIAGNOSIS — Z5189 Encounter for other specified aftercare: Secondary | ICD-10-CM

## 2012-08-19 LAB — GLUCOSE, CAPILLARY
Glucose-Capillary: 162 mg/dL — ABNORMAL HIGH (ref 70–99)
Glucose-Capillary: 190 mg/dL — ABNORMAL HIGH (ref 70–99)
Glucose-Capillary: 297 mg/dL — ABNORMAL HIGH (ref 70–99)
Glucose-Capillary: 109 mg/dL — ABNORMAL HIGH (ref 70–99)
Glucose-Capillary: 156 mg/dL — ABNORMAL HIGH (ref 70–99)

## 2012-08-19 NOTE — Evaluation (Addendum)
Occupational Therapy Assessment and Plan  Patient Details  Name: [name] MRN: [id number] Date of Birth: 1951/07/22  OT Diagnosis: hemiplegia affecting non-dominant side and muscular wasting and disuse atrophy Rehab Potential: Rehab Potential: Excellent ELOS: 17 days   Today's Date: 08/19/2012 Time: 1100-1205 Time Calculation (min): 65 min  Problem List:  Patient Active Problem List  Diagnosis  . DIABETES MELLITUS, TYPE II, UNCONTROLLED  . HYPERLIPIDEMIA-MIXED  . ERECTILE DYSFUNCTION  . MORTON'S NEUROMA, RIGHT  . HYPERTENSION, BENIGN  . CAD, NATIVE VESSEL  . ALLERGIC RHINITIS  . GERD  . SHOULDER PAIN, RIGHT  . Headache  . TOBACCO USE, QUIT  . Preventative health care  . TIA (transient ischemic attack)  . Dysphagia  . Stroke  . Constipation  . Chronic back pain  . Peripheral neuropathy  . CVA (cerebral infarction)  . Dysphagia following cerebrovascular accident  . Left hemiplegia    Past Medical History:  Past Medical History  Diagnosis Date  . ALLERGIC RHINITIS   . CAD, NATIVE VESSEL     BMS to OM1 2001, DES to BMS 2005  . DIAB W/UNSPEC COMP TYPE II/UNSPEC TYPE UNCNTRL   . ERECTILE DYSFUNCTION   . HYPERLIPIDEMIA-MIXED     takes Lipitor daily  . MORTON'S NEUROMA, RIGHT   . SHOULDER PAIN, RIGHT   . PONV (postoperative nausea and vomiting)   . HYPERTENSION, BENIGN     takes Lisinopril and Atenolol daily  . Coughing   . Shortness of breath     lying/sitting/exertion  . Headache     occasionally  . Peripheral neuropathy   . Arthritis   . Joint pain   . Joint swelling   . Chronic back pain     herniated disc  . GERD     takes Protonix daily  . Constipation     takes Carafate four times day  . Seasonal allergies     takes Allegra and Benadryl daily prn;uses Flonase daily  . Diabetes mellitus     on insulin  . Insomnia     doesn't require meds  . TIA on medication 02/2012    takes Plavix daily;on hold for surgery;rt sided weakness   Past  Surgical History:  Past Surgical History  Procedure Date  . Stent 2001, 2004    coronary stents  . Angioplasty   . Left knee surgury     x 2   . Dental surgery   . Coronary angioplasty 2005    2 stents  . Colonoscopy   . Laryngoplasty 08/07/2012    Procedure: LARYNGOPLASTY;  Surgeon: Izora Gala, MD;  Location: [facility];  Service: ENT;  Laterality: Left;  Left Vocal Cord Medialyzation    Assessment & Plan Clinical Impression: Patient is a 61 y.o. year old male with recent admission to the hospital on 08/14/12 had a TIA with similar presentation in May 2013. Workup was unremarkable and patient placed on plavix for CVA prophylaxis. CT head negative. 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 2D echo done revealing EF 50-55% with grade 1 diastolic dysfunction. Carotid dopplers without ICA stenosis. Patient had worsening of symptoms past admission.  Patient transferred to CIR on 08/18/2012 .    Patient currently requires max with basic self-care skills secondary to muscle paralysis and impaired timing and sequencing, unbalanced muscle activation and decreased coordination.  Prior to hospitalization, patient could complete ADLs and IADLs with independence.  Patient will benefit from skilled  intervention to decrease level of assist with basic self-care skills, increase independence with basic self-care skills and increase level of independence with iADL prior to discharge home with 24 hour supervision.  Anticipate patient will require minimal physical assistance and follow up outpatient.  OT - End of Session Activity Tolerance: Tolerates 30+ min activity with multiple rests Endurance Deficit: Yes OT Assessment Rehab Potential: Excellent OT Plan OT Frequency: 1-2 X/day, 60-90 minutes Estimated Length of Stay: 17 days OT Treatment/Interventions: Balance/vestibular training;Discharge planning;Community  reintegration;DME/adaptive equipment instruction;Neuromuscular re-education;Functional electrical stimulation;Patient/family education;Self Care/advanced ADL retraining;Therapeutic Exercise;Therapeutic Activities;Splinting/orthotics;UE/LE Strength taining/ROM;UE/LE Coordination activities OT Recommendation Follow Up Recommendations: Outpatient OT Equipment Recommended: 3 in 1 bedside comode;Tub/shower bench  OT Evaluation Precautions/Restrictions  Precautions Precautions: Fall Precaution Comments: left UE and LE hemiparesis Restrictions Weight Bearing Restrictions: No  Vital Signs Therapy Vitals Pulse Rate: 73  BP: 139/75 mmHg Pain Pain Assessment Pain Assessment: No/denies pain Home Living/Prior Functioning Home Living Lives With: Other (Comment) (Wife's sister and wife's brother-in-law) Available Help at Discharge: Family (son and daughter both work) Type of Home: House Home Access: Stairs to enter Technical brewer of Steps: 1 Entrance Stairs-Rails: None Home Layout: Two level;Other (Comment) (Has elevator to second floor.) Bathroom Shower/Tub: Walk-in shower;Door [employer] Toilet: Handicapped height Bathroom Accessibility: Yes How Accessible: Accessible via wheelchair;Accessible via walker Home Adaptive Equipment: Straight cane Prior Function Level of Independence: Independent with basic ADLs;Independent with gait Able to Take Stairs?: Yes Driving: Yes Vocation: Other (comment) (was pursuing degree in environmental science, 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  ADL   See FIM for ADL levels Vision/Perception  Vision - History Baseline Vision: No visual deficits Vision - Assessment Eye Alignment: Within Functional Limits Ocular Range of Motion: Within Functional Limits  Cognition Overall Cognitive Status: Appears within functional limits  for tasks assessed Arousal/Alertness: Awake/alert Orientation Level: Oriented X4 Attention: Divided Sustained Attention: Appears intact Divided Attention: Appears intact Memory: Appears intact Awareness: Appears intact Problem Solving: Appears intact Executive Function:  (need to further assess in diagnostic tx) Safety/Judgment: Impaired Comments: Pt attempts to stand before therapist in posiiton Sensation Sensation Light Touch: Impaired Detail Light Touch Impaired Details: Impaired LUE Stereognosis: Impaired Detail Stereognosis Impaired Details: Impaired LUE Proprioception: Impaired Detail Proprioception Impaired Details: Impaired LLE Coordination Gross Motor Movements are Fluid and Coordinated: No Fine Motor Movements are Fluid and Coordinated: No Coordination and Movement Description: 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  Decreased FM and gross motor coordination for bathing and dressing tasks. Motor  Motor Motor: Hemiplegia;Abnormal postural alignment and control Mobility  Bed Mobility Bed Mobility: Supine to Sit Supine to Sit: HOB flat;3: Mod assist Supine to Sit Details: Manual facilitation for weight shifting Supine to Sit Details (indicate cue type and reason): Pt able to perform supine to long sitting with min assist by pulling on bed, required mod assist to turn to Lt. to sit EOB secondary to Lt. Lean and decreased ability to advance Lt. LE Sitting - Scoot to Edge of Bed: 4: Min assist Sitting - Scoot to [place] of Bed Details: Manual facilitation for weight shifting;Tactile cues for initiation Sitting - Scoot to Edge of Bed Details (indicate cue type and reason): Max verbal cues for Lt. pelvic/trunk rotation to advance Lt. hip.  Sit to Supine: 3: Mod assist Transfers Sit to Stand: 3: Mod assist Stand to Sit: 3: Mod assist  Trunk/Postural Assessment  Cervical Assessment Cervical Assessment:  Within Functional Limits Thoracic Assessment Thoracic  Assessment: Within Functional Limits Lumbar Assessment Lumbar Assessment: Within Functional Limits Postural Control Postural Control: Deficits on evaluation Trunk Control: Pt with increased lean to the left.  Left side passive trunk elongation. Righting Reactions: Decreased righting reactions when falling/leaning to Lt.   Balance Balance Balance Assessed: Yes Static Sitting Balance Static Sitting - Balance Support: Right upper extremity supported Static Sitting - Level of Assistance: 4: Min assist Static Sitting - Comment/# of Minutes: Lt. lean in sitting Dynamic Sitting Balance Dynamic Sitting - Balance Support: No upper extremity supported Dynamic Sitting - Level of Assistance: 2: Max Insurance risk surveyor Standing - Balance Support: Bilateral upper extremity supported (Lt. through platform on walker) Static Standing - Level of Assistance: 2: Max assist Extremity/Trunk Assessment RUE Assessment RUE Assessment: Within Functional Limits LUE Assessment LUE Assessment: Exceptions to [facility] LUE Strength LUE Overall Strength Comments: Pt currently Brunnstrum stage IV in the arm.  Can exhibit shoulder flexion in synergy pattern to approximately 80 degrees.  Elbow flexion and extension also present and beginning to be able to isolate these movements.  In the hand has full grasp and release.  Can also oppose thumb to first, second digits.  See FIM for current functional status Refer to Care Plan for Long Term Goals  Recommendations for other services: None  Discharge Criteria: Patient will be discharged from OT if patient refuses treatment 3 consecutive times without medical reason, if treatment goals not met, if there is a change in medical status, if patient makes no progress towards goals or if patient is discharged from hospital.  The above assessment, treatment plan, treatment alternatives and goals were discussed and mutually agreed upon: by patient and by  family  Began education on selfcare retraining, use of the LUE for selfcare tasks and functional transfers.  Pt with limited attempt to use the LUE even though he has Brunnstrum stage IV movement.  Max assist for stand pivot transfers without assistive device. Pt with episodes of nausea and dizziness in sitting and standing.  BPs checked and documented with increased systolic pressure.  Delynda Sepulveda OTR/L 08/19/2012, 12:43 PM

## 2012-08-19 NOTE — Progress Notes (Signed)
Patient ID: [name], male   DOB: [birth year], 61 y.o.   MRN: [id number] Physical Medicine and Rehabilitation Admission H&P  Chief Complaint   Patient presents with   .  Left sided weakness, left facial droop.   :  HPI: [name] is a 61 y.o. RH-male with a history of TIA, diabetes mellitus, hyperlipidemia, recent laryngoplasty 9/16 who presented with acute recurrent weakness and numbness involving his left side with facial weakness on 08/14/12 had a TIA with similar presentation in May 2013. Workup was unremarkable and patient placed on plavix for CVA prophylaxis. CT head negative. 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 2D echo done revealing EF 50-55% with grade 1 diastolic dysfunction. Carotid dopplers without ICA stenosis. Patient had worsening of symptoms past admission. 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 FEES done (patient has had problems with aspiration) and placed on D2, thin liquids with hard swallow. He is developing left sided spasms. 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 Continues with left hemiparesis with dysphagia and working on pre-gait activities. PM&R evaluated this patient per request of the neuro team and felt he was appropriate for inpatient rehab and ultimately he was admitted yesterday  Review of Systems  Eyes: Negative for blurred vision and double vision.  Respiratory: Positive for cough but this is imoproving. Negative for wheezing.  Cardiovascular: Negative for chest pain and palpitations.  Gastrointestinal: Positive for nausea and constipation (chronic). Negative for abdominal pain.  Genitourinary: Negative for urgency and frequency.  Musculoskeletal: Positive for left chest wall pain. Has chronic low back pain also (chronic due to injury with numbness Left knee down.) and joint pain (chronic  eft knee pain).  Neurological: Positive for sensory change, speech change, focal weakness and headaches (frontal/orbital headaches).  Past Medical History   Diagnosis  Date   .  ALLERGIC RHINITIS    .  CAD, NATIVE VESSEL      BMS to OM1 2001, DES to BMS 2005   .  DIAB W/UNSPEC COMP TYPE II/UNSPEC TYPE UNCNTRL    .  ERECTILE DYSFUNCTION    .  HYPERLIPIDEMIA-MIXED      takes Lipitor daily   .  MORTON'S NEUROMA, RIGHT    .  SHOULDER PAIN, RIGHT    .  PONV (postoperative nausea and vomiting)    .  HYPERTENSION, BENIGN      takes Lisinopril and Atenolol daily   .  Coughing    .  Shortness of breath      lying/sitting/exertion   .  Headache      occasionally   .  Peripheral neuropathy    .  Arthritis    .  Joint pain    .  Joint swelling    .  Chronic back pain      herniated disc   .  GERD      takes Protonix daily   .  Constipation      takes Carafate four times day   .  Seasonal allergies      takes Allegra and Benadryl daily prn;uses Flonase daily   .  Diabetes mellitus      on insulin   .  Insomnia      doesn't require meds   .  TIA on medication  02/2012     takes Plavix daily;on hold for surgery;rt sided weakness    Past Surgical  History   Procedure  Date   .  Stent  2001, 2004     coronary stents   .  Angioplasty    .  Left knee surgury      x 2   .  Dental surgery    .  Coronary angioplasty  2005     2 stents   .  Colonoscopy    .  Laryngoplasty  08/07/2012     Procedure: LARYNGOPLASTY; Surgeon: Izora Gala, MD; Location: [facility]; Service: ENT; Laterality: Left; Left Vocal Cord Medialyzation    Family History   Problem  Relation  Age of Onset   .  Stroke     Social History: Married. 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 Was attending school at [employer]. He reports that he quit smoking about 29 years ago. He has never used smokeless tobacco. He reports that he does not drink alcohol or use illicit drugs  Allergies   Allergen  Reactions   .   Penicillins  Anaphylaxis   .  Pneumococcal Vaccines  Anaphylaxis     Pt states allergic to it.   .  Shellfish Allergy  Anaphylaxis   Scheduled Meds:  .  atenolol  25 mg  Oral  Daily   .  clopidogrel  75 mg  Oral  Daily   .  enoxaparin (LOVENOX) injection  40 mg  Subcutaneous  Q24H   .  feeding supplement  237 mL  Oral  Q1500   .  fluticasone  2 spray  Each Nare  Daily   .  gabapentin  100 mg  Oral  TID   .  gabapentin  300 mg  Oral  TID   .  hydrochlorothiazide  12.5 mg  Oral  Daily   .  insulin aspart  0-15 Units  Subcutaneous  TID AC & HS   .  lisinopril  2.5 mg  Oral  Daily   .  pantoprazole  40 mg  Oral  BID   .  simvastatin  10 mg  Oral  q1800   Continuous Infusions:  PRN Meds:.  Medications Prior to Admission   Medication  Sig  Dispense  Refill   .  atenolol (TENORMIN) 25 MG tablet  Take 25 mg by mouth daily.     .  clopidogrel (PLAVIX) 75 MG tablet  Take 75 mg by mouth daily.     .  diphenhydrAMINE (BENADRYL) 25 MG tablet  Take 25 mg by mouth every 6 (six) hours as needed. For allergies     .  fexofenadine (ALLEGRA) 180 MG tablet  Take 180 mg by mouth daily as needed. For allergies     .  fluticasone (FLONASE) 50 MCG/ACT nasal spray  Place 2 sprays into the nose daily.  16 g  2   .  hydrochlorothiazide (MICROZIDE) 12.5 MG capsule  Take 1 capsule (12.5 mg total) by mouth daily.  30 capsule  2   .  HYDROcodone-acetaminophen (LORTAB 7.5) 7.5-500 MG per tablet  Take 1 tablet by mouth every 6 (six) hours as needed for pain.  30 tablet  0   .  insulin aspart (NOVOLOG) 100 UNIT/ML injection  Inject 2-4 Units into the skin 3 (three) times daily before meals. 4 units in the morning and at night, and 2 units at lunch.     .  lisinopril (PRINIVIL,ZESTRIL) 40 MG tablet  Take 1 tablet (40 mg total) by mouth daily.  30 tablet  4   .  nitroGLYCERIN (NITROSTAT) 0.4 MG SL tablet  Place 0.4 mg under the tongue every 5 (five) minutes as needed. For chest pain     .  pantoprazole (PROTONIX) 40 MG  tablet  Take 1 tablet (40 mg total) by mouth 2 (two) times daily.  60 tablet  3   .  pravastatin (PRAVACHOL) 40 MG tablet  Take 40 mg by mouth at bedtime.     .  promethazine (PHENERGAN) 25 MG suppository  Place 1 suppository (25 mg total) rectally every 6 (six) hours as needed for nausea.  12 each  0   .  sucralfate (CARAFATE) 1 GM/10ML suspension  Take 10 mLs (1 g total) by mouth 4 (four) times daily.  420 mL  0   Home:  Home Living  Lives With: Spouse;Daughter (wife's sister and brother-in-law)  Available Help at Discharge: Family;Available 24 hours/day  Type of Home: House  Home Access: Stairs to enter  [employer] of Steps: 1  Entrance Stairs-Rails: None  Home Layout: Two level (has elevator to second floor)  Bathroom Shower/Tub: Walk-in shower;Door  [employer] Toilet: Handicapped height  Bathroom Accessibility: Yes  How Accessible: Accessible via wheelchair;Accessible via walker  Home Adaptive Equipment: None  Functional History:  Prior Function  Able to Take Stairs?: Yes  Driving: Yes  Vocation: Retired  Functional Status:  Mobility:  Bed Mobility  Bed Mobility: Supine to Sit;Sitting - Scoot to Edge of Bed  Supine to Sit: 4: Min assist  Sitting - Scoot to [employer] of Bed: 4: Min guard  Transfers  Transfers: Sit to Stand;Stand to Sit  Sit to Stand: 3: Mod assist;With upper extremity assist;From bed  Stand to Sit: 3: Mod assist;With upper extremity assist;To chair/3-in-1  Stand Pivot Transfers: 1: +2 Total assist  Stand Pivot Transfers: Patient Percentage: 40%  Ambulation/Gait  Ambulation/Gait Assistance: Not tested (comment)  Stairs: No  Wheelchair Mobility  Wheelchair Mobility: No  ADL:  ADL  Eating/Feeding: Simulated;Set up  Where Assessed - Eating/Feeding: Chair  Grooming: Simulated;Minimal assistance  Where Assessed - Grooming: Unsupported sitting  Upper Body Bathing: Simulated;Minimal assistance  Where Assessed - Upper Body Bathing: Unsupported standing    Lower Body Bathing: Simulated;Moderate assistance  Where Assessed - Lower Body Bathing: Supported sit to stand  Upper Body Dressing: Simulated;Moderate assistance  Where Assessed - Upper Body Dressing: Unsupported sitting  Lower Body Dressing: Performed;Moderate assistance  Where Assessed - Lower Body Dressing: Supported sit to stand  Toilet Transfer: Hydrographic surveyor Method: Development worker, international aid: (Bed to recliner going to pt right)  Equipment Used: Gait belt  Transfers/Ambulation Related to ADLs: Mod A for sit to stand and stand to sit from bed  Cognition:  Cognition  Arousal/Alertness: Awake/alert  Orientation Level: Oriented X4  Cognition  Overall Cognitive Status: Appears within functional limits for tasks assessed/performed  Arousal/Alertness: Awake/alert  Orientation Level: Appears intact for tasks assessed  Behavior During Session: [facility] for tasks performed  Cognition - Other Comments: wife stated that pt with increased depression  BP 135/92  Pulse 66  Temp 98.4 F (36.9 C) (Oral)  Resp 18  Ht 5\' 11"  (1.803 m)  Wt 204 lb 1.6 oz (92.579 kg)  BMI 28.47 kg/m2  SpO2 99%  Physical Exam  Nursing note and vitals reviewed.  Constitutional: He is oriented to person, place, and time. He appears well-developed and well-nourished.  HENT:  Head: Normocephalic. He has thrush over the tongue  Eyes: Pupils are equal, round, and reactive  to light.  Cardiovascular: Normal rate and regular rhythm. No murmurs  Pulmonary/Chest: Effort normal and breath sounds normal. No wheezes rales or rhonchi  Abdominal: Soft. Bowel sounds are normal. He exhibits no distension.  Musculoskeletal: He exhibits no edema. Mild left chest wall pain.  Neurological: He is alert and oriented to person, place, and time.  Left facial weakness. Mild dysarthria with dysphonia and intermittent cough noted. Mild left inattention with tendency to slump to the right. Left  hemiparesis UE>LE. Left hemisensory deficits from face to toe. LUE is grossly 3 to 3+ /5, he is 3- to 3/5 LLE. Cognitively quite appropriate. DTR's are 1+  Skin: Skin is warm and dry  GLUCOSE, CAPILLARY Status: Abnormal    Collection Time    08/17/12 4:45 PM   Component  Value  Range  Comment    Glucose-Capillary  232 (*)  70 - 99 mg/dL    GLUCOSE, CAPILLARY Status: Abnormal    Collection Time    08/17/12 9:37 PM   Component  Value  Range  Comment    Glucose-Capillary  151 (*)  70 - 99 mg/dL    CBC Status: Abnormal    Collection Time    08/18/12 5:25 AM   Component  Value  Range  Comment    WBC  6.4  4.0 - 10.5 K/uL     RBC  4.10 (*)  4.22 - 5.81 MIL/uL     Hemoglobin  12.7 (*)  13.0 - 17.0 g/dL     HCT  36.2 (*)  39.0 - 52.0 %     MCV  88.3  78.0 - 100.0 fL     MCH  31.0  26.0 - 34.0 pg     MCHC  35.1  30.0 - 36.0 g/dL     RDW  12.2  11.5 - 15.5 %     Platelets  218  150 - 400 K/uL    GLUCOSE, CAPILLARY Status: Abnormal    Collection Time    08/18/12 6:21 AM   Component  Value  Range  Comment    Glucose-Capillary  197 (*)  70 - 99 mg/dL    No results found.  Post Admission Physician Evaluation:  1. 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 2. Patient is admitted to receive collaborative, interdisciplinary care between the physiatrist, rehab nursing staff, and therapy team. 3. Patient's level of medical complexity and substantial therapy needs in context of that medical necessity cannot be provided at a lesser intensity of care such as a SNF. 4. Patient has experienced substantial functional loss from his/her baseline which was documented above under the "Functional History" and "Functional Status" headings. Judging by the patient's diagnosis, physical exam, and functional history, the patient has potential for functional progress which will result in measurable gains while on inpatient rehab. These gains will be of substantial and practical use upon  discharge in facilitating mobility and self-care at the household level. 5. Physiatrist will provide 24 hour management of medical needs as well as oversight of the therapy plan/treatment and provide guidance as appropriate regarding the interaction of the two. 6. 24 hour rehab nursing will assist with bladder management, bowel management, safety, skin/wound care, disease management, medication administration, pain management and patient education and help integrate therapy concepts, techniques,education, etc. 7. PT will assess and treat for: fxnl mobility, strength, NMR, adaptive equipment, safety. Goals are: sup to mod I. 8. OT will assess and treat for: fxnl mobility, ADL's, NMR, adaptive  equipment and technique, family ed. Goals are: supervision to Mod I. 9. SLP will assess and treat for: speech, communication, swallowing. Goals are: mod I. 10. Case Management and Social Worker will assess and treat for psychological issues and discharge planning. 11. Team conference will be held weekly to assess progress toward goals and to determine barriers to discharge. 12. Patient will receive at least 3 hours of therapy per day at least 5 days per week. 13. ELOS: 2 weeks Prognosis: excellent 14.  Medical Problem List and Plan:  1. DVT Prophylaxis/Anticoagulation: Pharmaceutical: Lovenox  2. Pain Management: tramadol, tylenol. May utilize heat or ice for rib cage discomfort  -neurontin added for ?radicular C-spine pain per neuro- observe for effect, titrate as appropriate. His presentation however is not consistent with a radiculopathy in my opinion  3. Mood: egosupport by team. He is fairly motivated and seems to be in good spirits  4. Neuropsych: This patient is capable of making decisions on his/her own behalf.  5. HTN: Monitor with bid checks. Avoid hypotension for now. Continue atenolol, HCTZ and lisinopril.  6. Constipation (acute on chronic): scheduled softener/laxative, suppository or enema if  needed  7. Thrush: nystatin mouth wash  8. Hyperlipidemia: zocor 10mg  daily

## 2012-08-19 NOTE — Evaluation (Signed)
Speech Language Pathology Assessment and Plan  Patient Details  Name: [name] MRN: [id number] Date of Birth: [birth year]  SLP Diagnosis: Dysarthria;Voice disorder  Rehab Potential: Good ELOS: 2-3 weeks?   Today's Date: 08/19/2012 Time: 1100-1205 Time Calculation (min): 65 min  Problem List:  Patient Active Problem List  Diagnosis  . DIABETES MELLITUS, TYPE II, UNCONTROLLED  . HYPERLIPIDEMIA-MIXED  . ERECTILE DYSFUNCTION  . MORTON'S NEUROMA, RIGHT  . HYPERTENSION, BENIGN  . CAD, NATIVE VESSEL  . ALLERGIC RHINITIS  . GERD  . SHOULDER PAIN, RIGHT  . Headache  . TOBACCO USE, QUIT  . Preventative health care  . TIA (transient ischemic attack)  . Dysphagia  . Stroke  . Constipation  . Chronic back pain  . Peripheral neuropathy  . CVA (cerebral infarction)  . Dysphagia following cerebrovascular accident  . Left hemiplegia   Past Medical History:  Past Medical History  Diagnosis Date  . ALLERGIC RHINITIS   . CAD, NATIVE VESSEL     BMS to OM1 2001, DES to BMS 2005  . DIAB W/UNSPEC COMP TYPE II/UNSPEC TYPE UNCNTRL   . ERECTILE DYSFUNCTION   . HYPERLIPIDEMIA-MIXED     takes Lipitor daily  . MORTON'S NEUROMA, RIGHT   . SHOULDER PAIN, RIGHT   . PONV (postoperative nausea and vomiting)   . HYPERTENSION, BENIGN     takes Lisinopril and Atenolol daily  . Coughing   . Shortness of breath     lying/sitting/exertion  . Headache     occasionally  . Peripheral neuropathy   . Arthritis   . Joint pain   . Joint swelling   . Chronic back pain     herniated disc  . GERD     takes Protonix daily  . Constipation     takes Carafate four times day  . Seasonal allergies     takes Allegra and Benadryl daily prn;uses Flonase daily  . Diabetes mellitus     on insulin  . Insomnia     doesn't require meds  . TIA on medication 02/2012    takes Plavix daily;on hold for surgery;rt sided weakness   Past Surgical History:  Past Surgical History  Procedure Date  .  Stent 2001, 2004    coronary stents  . Angioplasty   . Left knee surgury     x 2   . Dental surgery   . Coronary angioplasty 2005    2 stents  . Colonoscopy   . Laryngoplasty 08/07/2012    Procedure: LARYNGOPLASTY;  Surgeon: Izora Gala, MD;  Location: [facility];  Service: ENT;  Laterality: Left;  Left Vocal Cord Medialyzation    Assessment / Plan / Recommendation Clinical Impression  Pt. is a 61 yr old male admitted to acute with slurred speech during breakfast venue.  MRI revealed an acute non hemorrhagic right thalamic/posterior limb right internal.  PMH: hypertension, CAD, ESRD and diabetes mellitus, TIA 5/13, GERD, DM  Pt. with recent history (aprproximately  1 week ago) of Laryngoplasty for parlyized left vocal cord, to decrease aspiration and improve voice.  Pt. exhibits mild dysarthria at the conversation level characterized by subtle distortions, vocal hoarseness and low intensity.  Pt. underwent a FEES on acute care which recommended Dys 2 diet and thin liquids.  Pt. exhibited improvements with oral phase and was upgraded to Dys 3 texture.  Pt. declined solids during assessment due to nausea, however consumed thin orange juice without apparent difficutly.  He is at higher aspiration risk given recent vocal  cord surgery and acute CVA and will need continued observation for diet/liquid safety.  Cognitive abilities for basic and moderately complex information is WFL's.  Pt. desires to re-enroll in environmental science program at [employer], therefore recommend continued ST for complex executive function abilities.        SLP Assessment  Patient will need skilled Speech Lanaguage Pathology Services during CIR admission    Recommendations  Follow up Recommendations: Other (comment) (to be determined) Equipment Recommended: None recommended by SLP    SLP Frequency 1-2 X/day, 30-60 minutes   SLP Treatment/Interventions Dysphagia/aspiration precaution training;Functional  tasks;Patient/family education;Speech/Language facilitation    Pain Pain Assessment Pain Assessment: No/denies pain Prior Functioning Cognitive/Linguistic Baseline: Within functional limits Type of Home: House Vocation: Other (comment) (was pursuing degree in environmental science, 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 will implement speech strategies in conversation in a moderately noisy environment with min verbal cues.  SLP Short Term Goal 2 (Week 1): Pt. will safely consume a Dys 3 diet and thin liquids with min verbal cues for swallow precautions.  SLP Short Term Goal 3 (Week 1): Pt. will demonstrate executive functions during complex activities involving planning/organizing/self monitoring and correcting with min subtle cues.    See FIM for current functional status Refer to Care Plan for Long Term Goals  Recommendations for other services: None  Discharge Criteria: Patient will be discharged from SLP if patient refuses treatment 3 consecutive times without medical reason, if treatment goals not met, if there is a change in medical status, if patient makes no progress towards goals or if patient is discharged from hospital.  The above assessment, treatment plan, treatment alternatives and goals were discussed and mutually agreed upon: by patient  Houston Siren 08/19/2012, 4:29 PM

## 2012-08-19 NOTE — Evaluation (Signed)
Physical Therapy Assessment and Plan  Patient Details  Name: [name] MRN: [id number] Date of Birth: 11-18-1951  PT Diagnosis: Abnormal posture, Abnormality of gait, Ataxic gait, Coordination disorder, Difficulty walking, Hemiparesis non-dominant, Impaired sensation and Muscle weakness Rehab Potential: Good ELOS: ~3 weeks   Today's Date: 08/19/2012 Time: 1100-1205 Time Calculation (min): 65 min  Problem List:  Patient Active Problem List  Diagnosis  . DIABETES MELLITUS, TYPE II, UNCONTROLLED  . HYPERLIPIDEMIA-MIXED  . ERECTILE DYSFUNCTION  . MORTON'S NEUROMA, RIGHT  . HYPERTENSION, BENIGN  . CAD, NATIVE VESSEL  . ALLERGIC RHINITIS  . GERD  . SHOULDER PAIN, RIGHT  . Headache  . TOBACCO USE, QUIT  . Preventative health care  . TIA (transient ischemic attack)  . Dysphagia  . Stroke  . Constipation  . Chronic back pain  . Peripheral neuropathy  . CVA (cerebral infarction)  . Dysphagia following cerebrovascular accident  . Left hemiplegia    Past Medical History:  Past Medical History  Diagnosis Date  . ALLERGIC RHINITIS   . CAD, NATIVE VESSEL     BMS to OM1 2001, DES to BMS 2005  . DIAB W/UNSPEC COMP TYPE II/UNSPEC TYPE UNCNTRL   . ERECTILE DYSFUNCTION   . HYPERLIPIDEMIA-MIXED     takes Lipitor daily  . MORTON'S NEUROMA, RIGHT   . SHOULDER PAIN, RIGHT   . PONV (postoperative nausea and vomiting)   . HYPERTENSION, BENIGN     takes Lisinopril and Atenolol daily  . Coughing   . Shortness of breath     lying/sitting/exertion  . Headache     occasionally  . Peripheral neuropathy   . Arthritis   . Joint pain   . Joint swelling   . Chronic back pain     herniated disc  . GERD     takes Protonix daily  . Constipation     takes Carafate four times day  . Seasonal allergies     takes Allegra and Benadryl daily prn;uses Flonase daily  . Diabetes mellitus     on insulin  . Insomnia     doesn't require meds  . TIA on medication 02/2012    takes  Plavix daily;on hold for surgery;rt sided weakness   Past Surgical History:  Past Surgical History  Procedure Date  . Stent 2001, 2004    coronary stents  . Angioplasty   . Left knee surgury     x 2   . Dental surgery   . Coronary angioplasty 2005    2 stents  . Colonoscopy   . Laryngoplasty 08/07/2012    Procedure: LARYNGOPLASTY;  Surgeon: Izora Gala, MD;  Location: [facility];  Service: ENT;  Laterality: Left;  Left Vocal Cord Medialyzation    Assessment & Plan Clinical Impression: [name] is a 61 y.o. RH-male with a history of TIA, diabetes mellitus, hyperlipidemia, recent laryngoplasty 9/16 who presented with acute recurrent weakness and numbness involving his left side with facial weakness on 08/14/12 had a TIA with similar presentation in May 2013. Workup was unremarkable and patient placed on plavix for CVA prophylaxis. CT head negative. 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 2D echo done revealing EF 50-55% with grade 1 diastolic dysfunction. Carotid dopplers without ICA stenosis. Patient had worsening of symptoms past admission. 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 FEES done (patient has had problems with aspiration) and placed on D2,  thin liquids with hard swallow. He is developing left sided spasms. 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 Continues with left hemiparesis with dysphagia and working on pre-gait activities.  Patient transferred to CIR on 08/18/2012 .   Patient currently requires max/ total assist with mobility secondary to muscle weakness and muscle paralysis, impaired timing and sequencing, abnormal tone, unbalanced muscle activation, ataxia, decreased coordination and decreased motor planning, decreased attention, decreased awareness, decreased problem solving and delayed processing and decreased sitting  balance, decreased standing balance, decreased postural control and decreased balance strategies.  Performed trial of platform walker, pt feels better able to stabilize with this as compared to hemiwalker. Limited by Lt. Sided inattention, Lt. Pelvic/trunk retraction in sitting and standing, Lt. Lean in standing (increased by Lt. LE adduction and Rt. LE abduction). Will benefit from Lt. LE/trunk NMR, dynamic and static balance in sitting and standing. Prior to hospitalization, patient was independent with mobility and lived with Other (Comment) (Wife's sister and wife's brother-in-law) in a House home.  Home access is 1Stairs to enter.  Patient will benefit from skilled PT intervention to maximize safe functional mobility, minimize fall risk and decrease caregiver burden for planned discharge home with 24 hour assist.  Anticipate patient will benefit from follow up HHPT vs OP pending pt progress at discharge.  PT - End of Session Endurance Deficit: Yes PT Assessment Rehab Potential: Good Barriers to Discharge: Decreased caregiver support PT Plan PT Frequency: 2-3 X/day, 60-90 minutes Estimated Length of Stay: ~3 weeks PT Treatment/Interventions: Ambulation/gait training;Balance/vestibular training;Cognitive remediation/compensation;Community reintegration;Discharge planning;DME/adaptive equipment instruction;Functional mobility training;Neuromuscular re-education;Patient/family education;Psychosocial support;Therapeutic Exercise;Therapeutic Activities;Visual/perceptual remediation/compensation;Stair training;UE/LE Coordination activities;Splinting/orthotics;Skin care/wound management;UE/LE Strength taining/ROM;Wheelchair propulsion/positioning PT Recommendation Follow Up Recommendations: Outpatient PT;Home health PT;24 hour supervision/assistance (pending pt preference) Equipment Recommended: Rolling walker with 5" wheels (wheelchair pending progression)  PT  Evaluation Precautions/Restrictions Precautions Precautions: Fall Precaution Comments: left UE and LE hemiparesis Restrictions Weight Bearing Restrictions: No  Vital Signs Therapy Vitals Pulse Rate: 73  BP: 139/75 mmHg Patient Position, if appropriate: Sitting Pain Pain Assessment Pain Assessment: No/denies pain Home Living/Prior Functioning Home Living Lives With: Other (Comment) (Wife's sister and wife's brother-in-law) Available Help at Discharge: Family (son and daughter both work) Type of Home: House Home Access: Stairs to enter Technical brewer of Steps: 1 Entrance Stairs-Rails: None Home Layout: Two level;Other (Comment) (Has elevator to second floor.) Bathroom Shower/Tub: Walk-in shower;Door [employer] Toilet: Handicapped height Bathroom Accessibility: Yes How Accessible: Accessible via wheelchair;Accessible via walker Home Adaptive Equipment: Straight cane Prior Function Level of Independence: Independent with basic ADLs;Independent with gait Able to Take Stairs?: Yes Driving: Yes 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  Vision/Perception  Vision - History Baseline Vision: No visual deficits Vision - Assessment Eye Alignment: Within Functional Limits Ocular Range of Motion: Within Functional Limits  Cognition Overall Cognitive Status: Appears within functional limits for tasks assessed Arousal/Alertness: Awake/alert Orientation Level: Oriented X4 Attention: Sustained Problem Solving: Impaired Safety/Judgment: Impaired Comments: Pt attempts to stand before therapist in posiiton Sensation Sensation Light Touch: Impaired Detail Light Touch Impaired Details: Impaired LLE (present but greatly decreased) Proprioception: Impaired Detail Proprioception Impaired Details: Impaired LLE Coordination Gross Motor Movements are Fluid  and Coordinated: No Coordination and Movement Description: Impaired foot tap bil, pt unable to coordinate Motor  Motor Motor: Other (comment) (hemiparesis)  Mobility Bed Mobility Supine to Sit: 3: Mod assist Supine to Sit Details (indicate cue type and reason): Pt able  to perform supine to long sitting with min assist by pulling on bed, required mod assist to turn to Lt. to sit EOB secondary to Lt. Lean and decreased ability to advance Lt. LE Sitting - Scoot to Edge of Bed: 4: Min assist Sitting - Scoot to [place] of Bed Details (indicate cue type and reason): Max verbal cues for Lt. pelvic/trunk rotation to advance Lt. hip.  Sit to Supine: 3: Mod assist Transfers Sit to Stand: 3: Mod assist Stand to Sit: 3: Mod assist Stand Pivot Transfers: 1: +1 Total assist Stand Pivot Transfer Details: Verbal cues for precautions/safety;Verbal cues for safe use of DME/AE;Tactile cues for sequencing;Tactile cues for weight shifting;Verbal cues for sequencing;Manual facilitation for weight shifting Locomotion  Ambulation Ambulation/Gait Assistance: 1: +2 Total assist;2: Max assist Ambulation Distance (Feet): 6 Feet Assistive device: Left platform walker Ambulation/Gait Assistance Details: Manual facilitation for weight shifting;Verbal cues for sequencing;Tactile cues for weight beaing;Verbal cues for safe use of DME/AE Ambulation/Gait Assistance Details: Verbal cues and facilitation throughout to corrrect deficits below. Max/total assist needed with additional person following closely with chair.  Gait Gait Pattern: Ataxic;Narrow base of support;Trunk flexed;Trunk rotated posteriorly on left;Lateral trunk lean to left;Decreased hip/knee flexion - left;Decreased stance time - left;Decreased step length - right;Step-to pattern;Decreased weight shift to right;Decreased dorsiflexion - left (left ankle inversion, Lt. LE adduction, Rt. LE abduction) Gait velocity: decreased gait speed Stairs / Additional  Locomotion Stairs: No (unsafe to perform)  Trunk/Postural Assessment  Postural Control Postural Control: Deficits on evaluation Trunk Control: Lt. sided retraction of shoulder, torso, pelvis exacerbated by fatigue Righting Reactions: Decreased righting reactions when falling/leaning to Lt.   Balance Balance Balance Assessed: Yes Static Sitting Balance Static Sitting - Balance Support: Right upper extremity supported;Feet supported;Left upper extremity supported Static Sitting - Level of Assistance: 4: Min assist Static Sitting - Comment/# of Minutes: Lt. lean in sitting Static Standing Balance Static Standing - Balance Support: Bilateral upper extremity supported (Lt. through platform on walker) Static Standing - Level of Assistance: 3: Mod assist Extremity Assessment      RLE Assessment RLE Assessment: Exceptions to [facility] RLE AROM (degrees) RLE Overall AROM Comments: WFL RLE Strength RLE Overall Strength Comments: WFL LLE AROM (degrees) LLE Overall AROM Comments: Decreased active dorsiflexion, knee flexion/extension, hip flexion/extension unable to obtain full range.  LLE Strength Left Hip Flexion: 2-/5 Left Knee Flexion: 2-/5 Left Knee Extension: 2+/5 Left Ankle Dorsiflexion: 2/5    Skilled Therapeutic Interventions/Progress Updates:  Discussed goals and expectations for inpatient rehab. Discussed pt goals. Performed lateral scooting EOB to Rt. And Lt. Focusing on Lt. LE/UE placement, attention, and appropriate recruitment. Extra time spent on gait training with Lt. Platform walker, PT to facilitate Lt. Pelvic and torso anterior translation in standing. Pt with poor Lt. Knee control, appears to have very weak quads which causes buckling with intermittent hyperextension. PT to facilitate and assist with control of knee.   See FIM for current functional status Refer to Care Plan for Long Term Goals  Recommendations for other services: None  Discharge Criteria: Patient will  be discharged from PT if patient refuses treatment 3 consecutive times without medical reason, if treatment goals not met, if there is a change in medical status, if patient makes no progress towards goals or if patient is discharged from hospital.  The above assessment, treatment plan, treatment alternatives and goals were discussed and mutually agreed upon: by patient  Lahoma Rocker 08/19/2012, 12:33 PM

## 2012-08-19 NOTE — Progress Notes (Signed)
Patient reported had not urinated since last night at beginning of shift. Bladder scan showed 52. Patient refused to be cathed at that time. Encouraged fluids. Patient able to void 375 at 1220. Patient family reported concerns this morning with the gabapentin. They reported that he had trouble going when he was started on that. Patient has refused medication this shift. Vital signs stable. Patient reported dizziness with therapy this morning but vitals were stable. No other complaints noted. Continue with plan of care.

## 2012-08-20 ENCOUNTER — Inpatient Hospital Stay (HOSPITAL_COMMUNITY): Payer: Medicare Other | Admitting: *Deleted

## 2012-08-20 LAB — GLUCOSE, CAPILLARY
Glucose-Capillary: 151 mg/dL — ABNORMAL HIGH (ref 70–99)
Glucose-Capillary: 195 mg/dL — ABNORMAL HIGH (ref 70–99)
Glucose-Capillary: 139 mg/dL — ABNORMAL HIGH (ref 70–99)
Glucose-Capillary: 167 mg/dL — ABNORMAL HIGH (ref 70–99)
Glucose-Capillary: 241 mg/dL — ABNORMAL HIGH (ref 70–99)

## 2012-08-20 NOTE — Progress Notes (Signed)
Patient ID: [name], male   DOB: 1951/01/09, 61 y.o.   MRN: [id number] Physical Medicine and Rehabilitation Admission H&P  Chief Complaint   Patient presents with   .  Left sided weakness, left facial droop.    C/o cough and dizziness. C/o gabapentin is stopping up his bladder   HPI: [name] is a 61 y.o. RH-male with a history of TIA, diabetes mellitus, hyperlipidemia, recent laryngoplasty 9/16 who presented with acute recurrent weakness and numbness involving his left side with facial weakness on 08/14/12 had a TIA with similar presentation in May 2013. Workup was unremarkable and patient placed on plavix for CVA prophylaxis. CT head negative. 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 2D echo done revealing EF 50-55% with grade 1 diastolic dysfunction. Carotid dopplers without ICA stenosis. Patient had worsening of symptoms past admission. 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 FEES done (patient has had problems with aspiration) and placed on D2, thin liquids with hard swallow. He is developing left sided spasms. 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 Continues with left hemiparesis with dysphagia and working on pre-gait activities. PM&R evaluated this patient per request of the neuro team and felt he was appropriate for inpatient rehab and ultimately he was admitted yesterday  Review of Systems  Eyes: Negative for blurred vision and double vision.  Respiratory: Positive for cough but this is imoproving. Negative for wheezing.  Cardiovascular: Negative for chest pain and palpitations.  Gastrointestinal: Positive for nausea and constipation (chronic). Negative for abdominal pain.  Genitourinary: Negative for urgency and frequency.  Musculoskeletal: Positive for left chest wall pain. Has chronic low back pain also (chronic  due to injury with numbness Left knee down.) and joint pain (chronic eft knee pain).  Neurological: Positive for sensory change, speech change, focal weakness and headaches (frontal/orbital headaches).  Past Medical History   Diagnosis  Date   .  ALLERGIC RHINITIS    .  CAD, NATIVE VESSEL      BMS to OM1 2001, DES to BMS 2005   .  DIAB W/UNSPEC COMP TYPE II/UNSPEC TYPE UNCNTRL    .  ERECTILE DYSFUNCTION    .  HYPERLIPIDEMIA-MIXED      takes Lipitor daily   .  MORTON'S NEUROMA, RIGHT    .  SHOULDER PAIN, RIGHT    .  PONV (postoperative nausea and vomiting)    .  HYPERTENSION, BENIGN      takes Lisinopril and Atenolol daily   .  Coughing    .  Shortness of breath      lying/sitting/exertion   .  Headache      occasionally   .  Peripheral neuropathy    .  Arthritis    .  Joint pain    .  Joint swelling    .  Chronic back pain      herniated disc   .  GERD      takes Protonix daily   .  Constipation      takes Carafate four times day   .  Seasonal allergies      takes Allegra and Benadryl daily prn;uses Flonase daily   .  Diabetes mellitus      on insulin   .  Insomnia      doesn't require meds   .  TIA on medication  02/2012     takes  Plavix daily;on hold for surgery;rt sided weakness    Past Surgical History   Procedure  Date   .  Stent  2001, 2004     coronary stents   .  Angioplasty    .  Left knee surgury      x 2   .  Dental surgery    .  Coronary angioplasty  2005     2 stents   .  Colonoscopy    .  Laryngoplasty  08/07/2012     Procedure: LARYNGOPLASTY; Surgeon: Izora Gala, MD; Location: [facility] OR; Service: ENT; Laterality: Left; Left Vocal Cord Medialyzation    Family History   Problem  Relation  Age of Onset   .  Stroke     Social History: Married. 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 Was attending school at [employer]. He reports that he quit smoking about 29 years ago. He has never used smokeless tobacco. He reports that he does not drink alcohol  or use illicit drugs  Allergies   Allergen  Reactions   .  Penicillins  Anaphylaxis   .  Pneumococcal Vaccines  Anaphylaxis     Pt states allergic to it.   .  Shellfish Allergy  Anaphylaxis   Scheduled Meds:  .  atenolol  25 mg  Oral  Daily   .  clopidogrel  75 mg  Oral  Daily   .  enoxaparin (LOVENOX) injection  40 mg  Subcutaneous  Q24H   .  feeding supplement  237 mL  Oral  Q1500   .  fluticasone  2 spray  Each Nare  Daily   .  gabapentin  100 mg  Oral  TID   .  gabapentin  300 mg  Oral  TID   .  hydrochlorothiazide  12.5 mg  Oral  Daily   .  insulin aspart  0-15 Units  Subcutaneous  TID AC & HS   .  lisinopril  2.5 mg  Oral  Daily   .  pantoprazole  40 mg  Oral  BID   .  simvastatin  10 mg  Oral  q1800   Continuous Infusions:  PRN Meds:.  Medications Prior to Admission   Medication  Sig  Dispense  Refill   .  atenolol (TENORMIN) 25 MG tablet  Take 25 mg by mouth daily.     .  clopidogrel (PLAVIX) 75 MG tablet  Take 75 mg by mouth daily.     .  diphenhydrAMINE (BENADRYL) 25 MG tablet  Take 25 mg by mouth every 6 (six) hours as needed. For allergies     .  fexofenadine (ALLEGRA) 180 MG tablet  Take 180 mg by mouth daily as needed. For allergies     .  fluticasone (FLONASE) 50 MCG/ACT nasal spray  Place 2 sprays into the nose daily.  16 g  2   .  hydrochlorothiazide (MICROZIDE) 12.5 MG capsule  Take 1 capsule (12.5 mg total) by mouth daily.  30 capsule  2   .  HYDROcodone-acetaminophen (LORTAB 7.5) 7.5-500 MG per tablet  Take 1 tablet by mouth every 6 (six) hours as needed for pain.  30 tablet  0   .  insulin aspart (NOVOLOG) 100 UNIT/ML injection  Inject 2-4 Units into the skin 3 (three) times daily before meals. 4 units in the morning and at night, and 2 units at lunch.     .  lisinopril (PRINIVIL,ZESTRIL) 40 MG tablet  Take 1 tablet (  40 mg total) by mouth daily.  30 tablet  4   .  nitroGLYCERIN (NITROSTAT) 0.4 MG SL tablet  Place 0.4 mg under the tongue every 5 (five) minutes as  needed. For chest pain     .  pantoprazole (PROTONIX) 40 MG tablet  Take 1 tablet (40 mg total) by mouth 2 (two) times daily.  60 tablet  3   .  pravastatin (PRAVACHOL) 40 MG tablet  Take 40 mg by mouth at bedtime.     .  promethazine (PHENERGAN) 25 MG suppository  Place 1 suppository (25 mg total) rectally every 6 (six) hours as needed for nausea.  12 each  0   .  sucralfate (CARAFATE) 1 GM/10ML suspension  Take 10 mLs (1 g total) by mouth 4 (four) times daily.  420 mL  0   Home:  Home Living  Lives With: Spouse;Daughter (wife's sister and brother-in-law)  Available Help at Discharge: Family;Available 24 hours/day  Type of Home: House  Home Access: Stairs to enter  [employer] of Steps: 1  Entrance Stairs-Rails: None  Home Layout: Two level (has elevator to second floor)  Bathroom Shower/Tub: Walk-in shower;Door  [employer] Toilet: Handicapped height  Bathroom Accessibility: Yes  How Accessible: Accessible via wheelchair;Accessible via walker  Home Adaptive Equipment: None  Functional History:  Prior Function  Able to Take Stairs?: Yes  Driving: Yes  Vocation: Retired  Functional Status:  Mobility:  Bed Mobility  Bed Mobility: Supine to Sit;Sitting - Scoot to Edge of Bed  Supine to Sit: 4: Min assist  Sitting - Scoot to [employer] of Bed: 4: Min guard  Transfers  Transfers: Sit to Stand;Stand to Sit  Sit to Stand: 3: Mod assist;With upper extremity assist;From bed  Stand to Sit: 3: Mod assist;With upper extremity assist;To chair/3-in-1  Stand Pivot Transfers: 1: +2 Total assist  Stand Pivot Transfers: Patient Percentage: 40%  Ambulation/Gait  Ambulation/Gait Assistance: Not tested (comment)  Stairs: No  Wheelchair Mobility  Wheelchair Mobility: No  ADL:  ADL  Eating/Feeding: Simulated;Set up  Where Assessed - Eating/Feeding: Chair  Grooming: Simulated;Minimal assistance  Where Assessed - Grooming: Unsupported sitting  Upper Body Bathing: Simulated;Minimal assistance   Where Assessed - Upper Body Bathing: Unsupported standing  Lower Body Bathing: Simulated;Moderate assistance  Where Assessed - Lower Body Bathing: Supported sit to stand  Upper Body Dressing: Simulated;Moderate assistance  Where Assessed - Upper Body Dressing: Unsupported sitting  Lower Body Dressing: Performed;Moderate assistance  Where Assessed - Lower Body Dressing: Supported sit to stand  Toilet Transfer: Hydrographic surveyor Method: Development worker, international aid: (Bed to recliner going to pt right)  Equipment Used: Gait belt  Transfers/Ambulation Related to ADLs: Mod A for sit to stand and stand to sit from bed  Cognition:  Cognition  Arousal/Alertness: Awake/alert  Orientation Level: Oriented X4  Cognition  Overall Cognitive Status: Appears within functional limits for tasks assessed/performed  Arousal/Alertness: Awake/alert  Orientation Level: Appears intact for tasks assessed  Behavior During Session: [facility] for tasks performed  Cognition - Other Comments: wife stated that pt with increased depression  BP 130/86  Pulse 76  Temp 98.4 F (36.9 C) (Oral)  Resp 18  Ht 5\' 11"  (1.803 m)  Wt 201 lb 11.5 oz (91.5 kg)  BMI 28.13 kg/m2  SpO2 98%  Physical Exam  Nursing note and vitals reviewed.  Constitutional: He is oriented to person, place, and time. He appears well-developed and well-nourished.  HENT:  Head: Normocephalic.  He has thrush over the tongue  Eyes: Pupils are equal, round, and reactive to light.  Cardiovascular: Normal rate and regular rhythm. No murmurs  Pulmonary/Chest: Effort normal and breath sounds normal. No wheezes rales or rhonchi  Abdominal: Soft. Bowel sounds are normal. He exhibits no distension.  Musculoskeletal: He exhibits no edema. Mild left chest wall pain.  Neurological: He is alert and oriented to person, place, and time.  Left facial weakness. Mild dysarthria with dysphonia and intermittent cough noted. Mild  left inattention with tendency to slump to the right. Left hemiparesis UE>LE. Left hemisensory deficits from face to toe. LUE is grossly 3 to 3+ /5, he is 3- to 3/5 LLE. Cognitively quite appropriate. DTR's are 1+  Skin: Skin is warm and dry  GLUCOSE, CAPILLARY Status: Abnormal    Collection Time    08/17/12 4:45 PM   Component  Value  Range  Comment    Glucose-Capillary  232 (*)  70 - 99 mg/dL    GLUCOSE, CAPILLARY Status: Abnormal    Collection Time    08/17/12 9:37 PM   Component  Value  Range  Comment    Glucose-Capillary  151 (*)  70 - 99 mg/dL    CBC Status: Abnormal    Collection Time    08/18/12 5:25 AM   Component  Value  Range  Comment    WBC  6.4  4.0 - 10.5 K/uL     RBC  4.10 (*)  4.22 - 5.81 MIL/uL     Hemoglobin  12.7 (*)  13.0 - 17.0 g/dL     HCT  36.2 (*)  39.0 - 52.0 %     MCV  88.3  78.0 - 100.0 fL     MCH  31.0  26.0 - 34.0 pg     MCHC  35.1  30.0 - 36.0 g/dL     RDW  12.2  11.5 - 15.5 %     Platelets  218  150 - 400 K/uL    GLUCOSE, CAPILLARY Status: Abnormal    Collection Time    08/18/12 6:21 AM   Component  Value  Range  Comment    Glucose-Capillary  197 (*)  70 - 99 mg/dL    No results found.  Post Admission Physician Evaluation:  1. 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 2. Patient is admitted to receive collaborative, interdisciplinary care between the physiatrist, rehab nursing staff, and therapy team. 3. Patient's level of medical complexity and substantial therapy needs in context of that medical necessity cannot be provided at a lesser intensity of care such as a SNF. 4. Patient has experienced substantial functional loss from his/her baseline which was documented above under the "Functional History" and "Functional Status" headings. Judging by the patient's diagnosis, physical exam, and functional history, the patient has potential for functional progress which will result in measurable gains while on inpatient rehab.  These gains will be of substantial and practical use upon discharge in facilitating mobility and self-care at the household level. 5. Physiatrist will provide 24 hour management of medical needs as well as oversight of the therapy plan/treatment and provide guidance as appropriate regarding the interaction of the two. 6. 24 hour rehab nursing will assist with bladder management, bowel management, safety, skin/wound care, disease management, medication administration, pain management and patient education and help integrate therapy concepts, techniques,education, etc. 7. PT will assess and treat for: fxnl mobility, strength, NMR, adaptive equipment, safety. Goals are: sup to  mod I. 8. OT will assess and treat for: fxnl mobility, ADL's, NMR, adaptive equipment and technique, family ed. Goals are: supervision to Mod I. 9. SLP will assess and treat for: speech, communication, swallowing. Goals are: mod I. 10. Case Management and Social Worker will assess and treat for psychological issues and discharge planning. 11. Team conference will be held weekly to assess progress toward goals and to determine barriers to discharge. 12. Patient will receive at least 3 hours of therapy per day at least 5 days per week. 13. ELOS: 2 weeks Prognosis: excellent 14.  Medical Problem List and Plan:  1. DVT Prophylaxis/Anticoagulation: Pharmaceutical: Lovenox  2. Pain Management: tramadol, tylenol. May utilize heat or ice for rib cage discomfort  -neurontin added for ?radicular C-spine pain per neuro- observe for effect, titrate as appropriate. His presentation however is not consistent with a radiculopathy in my opinion  3. Mood: egosupport by team. He is fairly motivated and seems to be in good spirits  4. Neuropsych: This patient is capable of making decisions on his/her own behalf.  5. HTN: Monitor with bid checks. Avoid hypotension for now. Continue atenolol, HCTZ and d/c lisinopril. We may need to start Losartan if  BP goes up... 6. Constipation (acute on chronic): scheduled softener/laxative, suppository or enema if needed  7. Thrush: nystatin mouth wash  8. Hyperlipidemia: zocor 10mg  daily  9. Cough. D/c Lisinopril 10. Dizziness. As per #9 11. Urinary retention - partial. He relates it to Gabapentin - will hold

## 2012-08-20 NOTE — Progress Notes (Signed)
Physical Therapy Session Note  Patient Details  Name: [name] MRN: [id number] Date of Birth: [birth year]  Today's Date: 08/20/2012 Time: [id number] Time Calculation (min): 45 min  Short Term Goals: Week 1:  PT Short Term Goal 1 (Week 1): Pt will perform sit <> stand with min assist.  PT Short Term Goal 2 (Week 1): Pt will scoot pivot transfer bed <> chair with min assist  PT Short Term Goal 3 (Week 1): Pt will ambulate x 15' with max assist with LRAD.  Skilled Therapeutic Interventions/Progress Updates:  Tx focused on transfer training, NMR for LLE, and gait training with platform RW.  Pt propelled WC x30' with mod A for steering, using bil UE and RLE. Pt with difficulty releasing grip on L.   Instructed pt on squat-pivot transfer sequence, practicing x8 throughout tx with Mod A overall WC<>mat and bed. Pt has difficulty with hip ADD during functional mobility, but is able to manage somewhat with verbal cues.   Unsupported sitting edge of mat with min A needed initially due to L posterior LOB, but able to find midline orientation with cues and mirror. Performed partial squats with bil UE on LLE with mod/Max A x10 with therapist blocking hip ADD with LEs. Pt with difficulty controlling descent.  Sit<>stand x5 with Mod  A with static stance 5x60 sec for midline orientation and R/L weight shifts, therapist blocking L knee. Pt able to adjust with mirror.  Step-taps with 4" step and RW for UE support R/L x10 each for increased L stance control as well as L coordination for placement. Pt needing Mod A for stability due to L LOB.   Gait training 1x25' with platform RW and Max A for weight-shift R and LLE placement. Pt able to advance LLE, but adducts during swing. Pt also needing cues for upright posture.   Sit>supine with cues for moving through logrolling with Mod A for lifting LE and guiding trunk.  Pt left with heating pad to L neck, nursing advised to remove in 63min due to  therapist leaving.      Therapy Documentation Precautions:  Precautions Precautions: Fall Precaution Comments: left UE and LE hemiparesis Restrictions Weight Bearing Restrictions: No Pain: 8/10 L neck pain. Educated pt on importance of positioning due to R head/neck rotation, provided heating pad after tx, nursing advised to remove in 22min.    Locomotion : Ambulation Ambulation/Gait Assistance: 2: Max Financial controller Distance: 30   See FIM for current functional status  Therapy/Group: Individual Therapy  Soundra Pilon, PT 08/20/2012, 3:39 PM

## 2012-08-21 ENCOUNTER — Inpatient Hospital Stay (HOSPITAL_COMMUNITY): Payer: Medicare Other | Admitting: Occupational Therapy

## 2012-08-21 ENCOUNTER — Inpatient Hospital Stay (HOSPITAL_COMMUNITY): Payer: Medicare Other | Admitting: *Deleted

## 2012-08-21 ENCOUNTER — Inpatient Hospital Stay (HOSPITAL_COMMUNITY): Payer: Medicare Other

## 2012-08-21 LAB — GLUCOSE, CAPILLARY
Glucose-Capillary: 160 mg/dL — ABNORMAL HIGH (ref 70–99)
Glucose-Capillary: 171 mg/dL — ABNORMAL HIGH (ref 70–99)
Glucose-Capillary: 131 mg/dL — ABNORMAL HIGH (ref 70–99)
Glucose-Capillary: 179 mg/dL — ABNORMAL HIGH (ref 70–99)
Glucose-Capillary: 184 mg/dL — ABNORMAL HIGH (ref 70–99)
Glucose-Capillary: 127 mg/dL — ABNORMAL HIGH (ref 70–99)

## 2012-08-21 LAB — COMPREHENSIVE METABOLIC PANEL
ALT: 65 U/L — ABNORMAL HIGH (ref 0–53)
AST: 54 U/L — ABNORMAL HIGH (ref 0–37)
Albumin: 3.6 g/dL (ref 3.5–5.2)
Alkaline Phosphatase: 58 U/L (ref 39–117)
BUN: 16 mg/dL (ref 6–23)
CO2: 32 mEq/L (ref 19–32)
Calcium: 9.7 mg/dL (ref 8.4–10.5)
Chloride: 100 mEq/L (ref 96–112)
Creatinine, Ser: 1.36 mg/dL — ABNORMAL HIGH (ref 0.50–1.35)
GFR calc Af Amer: 63 mL/min — ABNORMAL LOW (ref >90)
GFR calc non Af Amer: 54 mL/min — ABNORMAL LOW (ref >90)
Glucose, Bld: 148 mg/dL — ABNORMAL HIGH (ref 70–99)
Potassium: 3.5 mEq/L (ref 3.5–5.1)
Sodium: 139 mEq/L (ref 135–145)
Total Bilirubin: 0.8 mg/dL (ref 0.3–1.2)
Total Protein: 7.2 g/dL (ref 6.0–8.3)

## 2012-08-21 LAB — CBC WITH DIFFERENTIAL/PLATELET
Basophils Absolute: 0 10*3/uL (ref 0.0–0.1)
Basophils Relative: 0 % (ref 0–1)
Eosinophils Absolute: 0.2 10*3/uL (ref 0.0–0.7)
Eosinophils Relative: 3 % (ref 0–5)
HCT: 38.4 % — ABNORMAL LOW (ref 39.0–52.0)
Hemoglobin: 12.9 g/dL — ABNORMAL LOW (ref 13.0–17.0)
Lymphocytes Relative: 36 % (ref 12–46)
Lymphs Abs: 2.3 10*3/uL (ref 0.7–4.0)
MCH: 30.4 pg (ref 26.0–34.0)
MCHC: 33.6 g/dL (ref 30.0–36.0)
MCV: 90.6 fL (ref 78.0–100.0)
Monocytes Absolute: 0.6 10*3/uL (ref 0.1–1.0)
Monocytes Relative: 10 % (ref 3–12)
Neutro Abs: 3.1 10*3/uL (ref 1.7–7.7)
Neutrophils Relative %: 50 % (ref 43–77)
Platelets: 216 10*3/uL (ref 150–400)
RBC: 4.24 MIL/uL (ref 4.22–5.81)
RDW: 12.5 % (ref 11.5–15.5)
WBC: 6.2 10*3/uL (ref 4.0–10.5)

## 2012-08-21 MED ORDER — TEMAZEPAM 7.5 MG PO CAPS
7.5000 mg | ORAL_CAPSULE | Freq: Every evening | ORAL | Status: DC | PRN
  Administered 2012-08-21 – 2012-08-23 (×3): 7.5 mg via ORAL
  Filled 2012-08-21 (×4): qty 1

## 2012-08-21 NOTE — Progress Notes (Signed)
Patient ID: [name], male   DOB: 11-28-1950, 61 y.o.   MRN: [id number] Subjective/Complaints: [name] is a 61 y.o. RH-male with a history of TIA, diabetes mellitus, hyperlipidemia, recent laryngoplasty 9/16 who presented with acute recurrent weakness and numbness involving his left side with facial weakness on 08/14/12 had a TIA with similar presentation in May 2013. Workup was unremarkable and patient placed on plavix for CVA prophylaxis. CT head negative. 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 2D echo done revealing EF 50-55% with grade 1 diastolic dysfunction. Carotid dopplers without ICA stenosis. Patient had worsening of symptoms past admission. 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 FEES done (patient has had problems with aspiration) and placed on D2, thin liquids with hard swallow. He is developing left sided spasms. 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   Left-sided turning pain mild Difficulty urinating Occasional cough nonproductive Review of Systems  Constitutional: Positive for malaise/fatigue.  HENT:       Vocal hoarseness  Respiratory: Positive for cough. Negative for sputum production, shortness of breath and wheezing.   Gastrointestinal: Positive for nausea. Negative for vomiting.  All other systems reviewed and are negative.   Objective: Vital Signs: Blood pressure 128/78, pulse 83, temperature 98.3 F (36.8 C), temperature source Oral, resp. rate 17, height 5\' 11"  (1.803 m), weight 91.5 kg (201 lb 11.5 oz), SpO2 99.00%. No results found. Results for orders placed during the hospital encounter of 08/18/12 (from the past 72 hour(s))  GLUCOSE, CAPILLARY     Status: Abnormal   Collection Time   08/18/12  4:47 PM      Component Value Range Comment   Glucose-Capillary 155 (*) 70 - 99 mg/dL     GLUCOSE, CAPILLARY     Status: Abnormal   Collection Time   08/18/12  8:20 PM      Component Value Range Comment   Glucose-Capillary 157 (*) 70 - 99 mg/dL   GLUCOSE, CAPILLARY     Status: Abnormal   Collection Time   08/19/12  7:26 AM      Component Value Range Comment   Glucose-Capillary 162 (*) 70 - 99 mg/dL    Comment 1 Notify RN     GLUCOSE, CAPILLARY     Status: Abnormal   Collection Time   08/19/12  8:32 AM      Component Value Range Comment   Glucose-Capillary 190 (*) 70 - 99 mg/dL    Comment 1 Notify RN     GLUCOSE, CAPILLARY     Status: Abnormal   Collection Time   08/19/12 12:00 PM      Component Value Range Comment   Glucose-Capillary 297 (*) 70 - 99 mg/dL    Comment 1 Notify RN     GLUCOSE, CAPILLARY     Status: Abnormal   Collection Time   08/19/12  4:57 PM      Component Value Range Comment   Glucose-Capillary 109 (*) 70 - 99 mg/dL   GLUCOSE, CAPILLARY     Status: Abnormal   Collection Time   08/19/12  9:06 PM      Component Value Range Comment   Glucose-Capillary 156 (*) 70 - 99 mg/dL   GLUCOSE, CAPILLARY     Status: Abnormal   Collection Time   08/20/12  7:37 AM      Component Value Range Comment   Glucose-Capillary 151 (*)  70 - 99 mg/dL    Comment 1 Notify RN      Comment 2 Documented in Chart     GLUCOSE, CAPILLARY     Status: Abnormal   Collection Time   08/20/12 12:09 PM      Component Value Range Comment   Glucose-Capillary 195 (*) 70 - 99 mg/dL    Comment 1 Documented in Chart     GLUCOSE, CAPILLARY     Status: Abnormal   Collection Time   08/20/12  4:35 PM      Component Value Range Comment   Glucose-Capillary 139 (*) 70 - 99 mg/dL    Comment 1 Notify RN     GLUCOSE, CAPILLARY     Status: Abnormal   Collection Time   08/20/12  6:45 PM      Component Value Range Comment   Glucose-Capillary 167 (*) 70 - 99 mg/dL    Comment 1 Documented in Chart     GLUCOSE, CAPILLARY     Status: Abnormal   Collection Time   08/20/12  8:30 PM      Component  Value Range Comment   Glucose-Capillary 241 (*) 70 - 99 mg/dL    Comment 1 Notify RN     COMPREHENSIVE METABOLIC PANEL     Status: Abnormal   Collection Time   08/21/12  5:38 AM      Component Value Range Comment   Sodium 139  135 - 145 mEq/L    Potassium 3.5  3.5 - 5.1 mEq/L    Chloride 100  96 - 112 mEq/L    CO2 32  19 - 32 mEq/L    Glucose, Bld 148 (*) 70 - 99 mg/dL    BUN 16  6 - 23 mg/dL    Creatinine, Ser 1.36 (*) 0.50 - 1.35 mg/dL    Calcium 9.7  8.4 - 10.5 mg/dL    Total Protein 7.2  6.0 - 8.3 g/dL    Albumin 3.6  3.5 - 5.2 g/dL    AST 54 (*) 0 - 37 U/L    ALT 65 (*) 0 - 53 U/L    Alkaline Phosphatase 58  39 - 117 U/L    Total Bilirubin 0.8  0.3 - 1.2 mg/dL    GFR calc non Af Amer 54 (*) >90 mL/min    GFR calc Af Amer 63 (*) >90 mL/min   GLUCOSE, CAPILLARY     Status: Abnormal   Collection Time   08/21/12  7:32 AM      Component Value Range Comment   Glucose-Capillary 160 (*) 70 - 99 mg/dL    Comment 1 Notify RN     GLUCOSE, CAPILLARY     Status: Abnormal   Collection Time   08/21/12  9:30 AM      Component Value Range Comment   Glucose-Capillary 171 (*) 70 - 99 mg/dL    Comment 1 Notify RN     GLUCOSE, CAPILLARY     Status: Abnormal   Collection Time   08/21/12 11:22 AM      Component Value Range Comment   Glucose-Capillary 131 (*) 70 - 99 mg/dL      HEENT: horse voice Cardio: RRR Resp: CTA B/L GI: BS positive Extremity:  No Edema Skin:   Intact Neuro: Alert/Oriented, Anxious, Abnormal Sensory reduced on left, Abnormal Motor 2 minus left deltoid bicep tricep grip as well as left hip flexor knee extensor 1 ankle dorsiflexor plantar flexor and Abnormal FMC Ataxic/ dec  Peck and Tone  increased tone left abductors and extensors in the lower extremity Musc/Skel:  Other pain with resisted hip abduction. No tenderness over the lateral hip. No tenderness over the knee. Tenderness over the  medial hip adductors peripheral pulses normal, normal skin temperature  in the  feet   Assessment/Plan: 1. 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 Physiatrist is providing close team supervision and 24 hour management of active medical problems listed below. Physiatrist and rehab team continue to assess barriers to discharge/monitor patient progress toward functional and medical goals. FIM: FIM - Bathing Bathing Steps Patient Completed: Chest;Left Arm;Abdomen;Front perineal area;Buttocks;Right upper leg;Left upper leg;Right lower leg (including foot) Bathing: 4: Min-Patient completes 8-9 52f 10 parts or 75+ percent  FIM - Upper Body Dressing/Undressing Upper body dressing/undressing steps patient completed: Thread/unthread right sleeve of pullover shirt/dresss;Put head through opening of pull over shirt/dress;Pull shirt over trunk Upper body dressing/undressing: 4: Min-Patient completed 75 plus % of tasks FIM - Lower Body Dressing/Undressing Lower body dressing/undressing steps patient completed: Thread/unthread right pants leg;Thread/unthread right underwear leg;Thread/unthread left underwear leg;Thread/unthread left pants leg;Don/Doff right sock;Don/Doff right shoe Lower body dressing/undressing: 3: Mod-Patient completed 50-74% of tasks  FIM - Toileting Toileting: 0: Activity did not occur  FIM - Air cabin crew Transfers: 0-Activity did not occur  FIM - Control and instrumentation engineer Devices: Walker;Bed rails;Arm rests Bed/Chair Transfer: 2: Supine > Sit: Max A (lifting assist/Pt. 25-49%);3: Chair or W/C > Bed: Mod A (lift or lower assist);4: Sit > Supine: Min A (steadying pt. > 75%/lift 1 leg);3: Bed > Chair or W/C: Mod A (lift or lower assist)  FIM - Locomotion: Wheelchair Distance: 30 Locomotion: Wheelchair: 1:  Total Assistance/staff pushes wheelchair (Pt<25%) FIM - Locomotion: Ambulation Locomotion: Ambulation Assistive Devices: Engineer, agricultural Ambulation/Gait Assistance: 2: Max assist Locomotion: Ambulation: 1: Travels less than 50 ft with maximal assistance (Pt: 25 - 49%)  Comprehension Comprehension Mode: Auditory Comprehension: 6-Follows complex conversation/direction: With extra time/assistive device  Expression Expression Mode: Verbal Expression: 6-Expresses complex ideas: With extra time/assistive device  Social Interaction Social Interaction: 6-Interacts appropriately with others with medication or extra time (anti-anxiety, antidepressant).  Problem Solving Problem Solving Mode: Not assessed Problem Solving: 5-Solves basic problems: With no assist  Memory Memory Mode: Not assessed Memory: 6-More than reasonable amt of time   Medical Problem List and Plan:  1. DVT Prophylaxis/Anticoagulation: Pharmaceutical: Lovenox  2. Pain Management: tramadol, tylenol. May utilize heat or ice for rib cage discomfort  -neurontin added for ?radicular C-spine pain per neuro- observe for effect, titrate as appropriate. 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 Mood: egosupport by team. He is fairly motivated and seems to be in good spirits  4. Neuropsych: This patient is capable of making decisions on his/her own behalf.  5. HTN: Monitor with bid checks. Avoid hypotension for now. Continue atenolol, HCTZ and lisinopril.  6. Constipation (acute on chronic): scheduled softener/laxative, suppository or enema if needed  7. Thrush: nystatin mouth wash  8. Hyperlipidemia: zocor 10mg  daily 9. 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 His swallowing improved postoperatively and has not noticeably worsened post stroke.Continues to have a cough which may be either a  low-grade aspiration or side  effect of his ACE inhibitor Multi-. Urinary hesitancy DC diphenhydramine and trazodone  LOS (Days) 3 A FACE TO FACE EVALUATION WAS PERFORMED  Sharika Mosquera E 08/21/2012, 1:04 PM

## 2012-08-21 NOTE — Progress Notes (Signed)
Occupational Therapy Session Note  Patient Details  Name: [name] MRN: [id number] Date of Birth: 12-08-1950  Today's Date: 08/21/2012 Time: 1105 -1215 Time Calculation (min): 70 min  Short Term Goals: Week 1: OT Short Term Goal 1 (Week 1): Pt will perform UB bathing with min assist for washing the right arm using the LUE. OT Short Term Goal 2 (Week 1): Pt will perfrom UB dressing with supervision unsupported sitting. OT Short Term Goal 3 (Week 1): Pt will perform LB dressing with min assist sit to stand to donn pants only. OT Short Term Goal 4 (Week 1): Pt will use the LUE as an active assist for selfcare and grooming tasks with no more than min instructional cueing and mod facilitation.  Skilled Therapeutic Interventions/Progress Updates:  Patient seated in recliner stating would like to shower however has been nauseated since he woke up this am.  Gave patient saltine crackers and diet ginger ale after informing RN.  Patient able to participate in sponge bath and dressing with rest breaks and slow movements.  Focus session on increased functional use of LUE during all BADL tasks and with functional mobility, weight bearing into LLE & LUE during all transitional movements and static and dynamic balance.  Patient with increased left sided abnormal tone with increased effort as well as patient's LUE success depends on how well he attends to the hand/arm while it is at work.   Therapy Documentation Precautions:  Precautions Precautions: Fall Precaution Comments: left UE and LE hemiparesis Restrictions Weight Bearing Restrictions: No  Pain:  Reports 3/10 left shoulder pain, premedicated and reports no pain at end of session.  See FIM for current functional status  Therapy/Group: Individual Therapy  Rita Vialpando, [place] 08/21/2012, 12:20 PM

## 2012-08-21 NOTE — Care Management Note (Signed)
Inpatient [facility] Individual Statement of Services  Patient Name:  Zayaan Sanghera  Date:  08/21/2012  Welcome to the [facility].  Our goal is to provide you with an individualized program based on your diagnosis and situation, designed to meet your specific needs.  With this comprehensive rehabilitation program, you will be expected to participate in at least 3 hours of rehabilitation therapies Monday-Friday, with modified therapy programming on the weekends.  Your rehabilitation program will include the following services:  Physical Therapy (PT), Occupational Therapy (OT), Speech Therapy (ST), 24 hour per day rehabilitation nursing, Therapeutic Recreaction (TR), Case Management (RN and Education officer, museum), Rehabilitation Medicine, Nutrition Services and Pharmacy Services  Weekly team conferences will be held on Wednesday to discuss your progress.  Your RN Case Writer will talk with you frequently to get your input and to update you on team discussions.  Team conferences with you and your family in attendance may also be held.  Expected length of stay: 2.5-3 weeks  Overall anticipated outcome: supervision/min assist  Depending on your progress and recovery, your program may change.  Your RN Case Engineer, production will coordinate services and will keep you informed of any changes.  Your RN Tourist information centre manager and SW names and contact numbers are listed  below.  The following services may also be recommended but are not provided by the [facility] will be made to provide these services after discharge if needed.  Arrangements include referral to agencies that provide these services.  Your insurance has been verified to be:  [place] Your primary doctor is:  Dr. [name]  Pertinent information will be shared with your doctor and your insurance company.    Social Worker:  Ovidio Kin, [place]  Information discussed with and copy given to patient by: Elease Hashimoto, 08/21/2012, 8:18 AM

## 2012-08-21 NOTE — Progress Notes (Signed)
Physical Therapy Session Note  Patient Details  Name: [name] MRN: [id number] Date of Birth: [birth year]  Today's Date: 08/21/2012 Time: 0900-0955 Time Calculation (min): 55 min  Short Term Goals: Week 1:  PT Short Term Goal 1 (Week 1): Pt will perform sit <> stand with min assist.  PT Short Term Goal 2 (Week 1): Pt will scoot pivot transfer bed <> chair with min assist  PT Short Term Goal 3 (Week 1): Pt will ambulate x 15' with max assist with LRAD.  Skilled Therapeutic Interventions/Progress Updates:    pt c/o abdominal/groin pain on L, MD present and aware, ? If related to tone but pt also c/o nausea and stated he couldn't eat breakfast; willing to participate but became very sweaty, nurse came and blood sugar 171, BP 128/78 HR 83. MD also reports thalmic pain syndrome.  Kinesiotape to L upper trap d/t pain/tightness.  After resting he was able to try again (changed to activity on mat from standing) but then stated he became dizzy.  Pt assisted into recliner to rest at end of session.  NMR- pt noted to use abnormal movement patterns for supine to sit to L of bed, required max A- worked on this on mat and progressed to min A with cues for sequence/no abnormal compensations; standing working on sustained L hip and knee extensor activity and midline posture- pt with decreased proprioception and tends to rotate and be unaware of LLE position, mirror for visual feedback did help  Scoot/squat transfers mod A, sit to stand min A  Therapy Documentation Precautions:  Precautions Precautions: Fall Precaution Comments: left UE and LE hemiparesis Restrictions Weight Bearing Restrictions: No    Vital Signs: Therapy Vitals Pulse Rate: 83  BP: 128/78 mmHg (feeling lightheaded with therapy. blood sugar 171) Patient Position, if appropriate: Sitting        See FIM for current functional status  Therapy/Group: Individual Therapy  Othelia Pulling 08/21/2012, 12:11  PM

## 2012-08-21 NOTE — Progress Notes (Signed)
Speech Language Pathology Daily Session Note  Patient Details  Name: [name] MRN: [id number] Date of Birth: 19-Jun-1951  Today's Date: 08/21/2012 Time: 1500-1530 Time Calculation (min): 30 min  Short Term Goals: Week 1: SLP Short Term Goal 1 (Week 1): Pt. will implement speech strategies in conversation in a moderately noisy environment with min verbal cues.  SLP Short Term Goal 1 - Progress (Week 1): Progressing toward goal SLP Short Term Goal 2 (Week 1): Pt. will safely consume a Dys 3 diet and thin liquids with min verbal cues for swallow precautions.  SLP Short Term Goal 2 - Progress (Week 1): Progressing toward goal SLP Short Term Goal 3 (Week 1): Pt. will demonstrate executive functions during complex activities involving planning/organizing/self monitoring and correcting with min subtle cues.    Skilled Therapeutic Interventions: Treatment focused on dysphagia and diet tolerance. Patient modified independence with use of compensatory strategies during clinician provided po trials. C/o globus particularly on left side of throat. Suspect presence of mild pharyngeal residuals due to pharyngeal weakness and edema s/p surgery however cannot r/o component of GERD given patient history. Reviewed speech intelligibility strategies including use of slow rate and slightly increased volume without abusing vocal cords. Patient verbalized understanding.    FIM:  Comprehension Comprehension Mode: Auditory Comprehension: 7-Follows complex conversation/direction: With no assist Expression Expression Mode: Verbal Expression: 6-Expresses complex ideas: With extra time/assistive device Social Interaction Social Interaction: 6-Interacts appropriately with others with medication or extra time (anti-anxiety, antidepressant). Problem Solving Problem Solving: 6-Solves complex problems: With extra time Memory Memory: 7-Complete Independence: No helper FIM - Eating Eating Activity: 5:  Supervision/cues  Pain Pain Assessment Pain Assessment: No/denies pain  Therapy/Group: Individual Therapy  [name], [name] [phone]   [name] 08/21/2012, 3:57 PM

## 2012-08-21 NOTE — Progress Notes (Signed)
Physical Therapy Session Note  Patient Details  Name: [name] MRN: [id number] Date of Birth: 12-Jun-1951  Today's Date: 08/21/2012 Time: [id number] Time Calculation (min): 45 min  Short Term Goals: No short term goals set  Skilled Therapeutic Interventions/Progress Updates:    NMR- standing weight bearing thru LUE and LLE working on extending L elbow and knee and hip, then weight shifting, side stepping and moving items with LUE; pt still not feeling position of extremeties fully but better able to actiiate by end- however pt c/o nausea, weakness and feeling like he was gonning to pass out- nurse informed, again vitals and blood sugars WNF- PA present and feels it may be vestibular; pt assisted back to bed to rest   Pain- none at start but c/o L back pain during tx stating he has prior injury and uses TENS at home, asked him to have it brought in, back pain decreased with rest break  Therapy Documentation Precautions:  Precautions Precautions: Fall Precaution Comments: left UE and LE hemiparesis Restrictions Weight Bearing Restrictions: No   Exercises: gave ankle alphabet and open chain UE exercise to perform in room       See FIM for current functional status  Therapy/Group: Individual Therapy  [name] 08/21/2012, 4:08 PM

## 2012-08-22 ENCOUNTER — Inpatient Hospital Stay (HOSPITAL_COMMUNITY): Payer: Medicare Other | Admitting: Occupational Therapy

## 2012-08-22 ENCOUNTER — Inpatient Hospital Stay (HOSPITAL_COMMUNITY): Payer: Medicare Other | Admitting: Physical Therapy

## 2012-08-22 ENCOUNTER — Inpatient Hospital Stay (HOSPITAL_COMMUNITY): Payer: Medicare Other | Admitting: Speech Pathology

## 2012-08-22 DIAGNOSIS — I6330 Cerebral infarction due to thrombosis of unspecified cerebral artery: Secondary | ICD-10-CM

## 2012-08-22 DIAGNOSIS — Z5189 Encounter for other specified aftercare: Secondary | ICD-10-CM

## 2012-08-22 LAB — GLUCOSE, CAPILLARY
Glucose-Capillary: 138 mg/dL — ABNORMAL HIGH (ref 70–99)
Glucose-Capillary: 181 mg/dL — ABNORMAL HIGH (ref 70–99)
Glucose-Capillary: 204 mg/dL — ABNORMAL HIGH (ref 70–99)
Glucose-Capillary: 110 mg/dL — ABNORMAL HIGH (ref 70–99)
Glucose-Capillary: 129 mg/dL — ABNORMAL HIGH (ref 70–99)

## 2012-08-22 MED ORDER — WHITE PETROLATUM GEL
Status: AC
  Filled 2012-08-22: qty 5

## 2012-08-22 NOTE — Progress Notes (Signed)
Occupational Therapy Session Note  Patient Details  Name: [name] MRN: [id number] Date of Birth: [birth year]  Today's Date: 08/22/2012 Time: 1100-1130 Time Calculation (min): 30 min  Short Term Goals: Week 1:  OT Short Term Goal 1 (Week 1): Pt will perform UB bathing with min assist for washing the right arm using the LUE. OT Short Term Goal 2 (Week 1): Pt will perfrom UB dressing with supervision unsupported sitting. OT Short Term Goal 3 (Week 1): Pt will perform LB dressing with min assist sit to stand to donn pants only. OT Short Term Goal 4 (Week 1): Pt will use the LUE as an active assist for selfcare and grooming tasks with no more than min instructional cueing and mod facilitation.      Skilled Therapeutic Interventions/Progress Updates:    Pt seen for LUE neuro re-ed with a/arom focusing on proximal muscles and resisted wrist extension and grasping exercises.  Pt was able to push arm forward in gravity eliminated position with resistance.  He is also able to raise arm to 80 degrees of flexion.  Worked on placing and holding exercises.  Therapy Documentation Precautions:  Precautions Precautions: Fall Precaution Comments: left UE and LE hemiparesis Restrictions Weight Bearing Restrictions: No Pain: Pain Assessment Pain Assessment: No/denies pain ADL:  See FIM for current functional status  Therapy/Group: Individual Therapy  [employer] 08/22/2012, 12:07 PM

## 2012-08-22 NOTE — Progress Notes (Addendum)
Physical Therapy Assessment and Plan  Patient Details  Name: [name] MRN: [id number] Date of Birth: 02-26-1951  PT Diagnosis: Dizziness and Vertigo of central origin   Today's Date: 08/22/2012 Time: [id number] Time Calculation (min): 65 min  Problem List:  Patient Active Problem List  Diagnosis  . DIABETES MELLITUS, TYPE II, UNCONTROLLED  . HYPERLIPIDEMIA-MIXED  . ERECTILE DYSFUNCTION  . MORTON'S NEUROMA, RIGHT  . HYPERTENSION, BENIGN  . CAD, NATIVE VESSEL  . ALLERGIC RHINITIS  . GERD  . SHOULDER PAIN, RIGHT  . Headache  . TOBACCO USE, QUIT  . Preventative health care  . TIA (transient ischemic attack)  . Dysphagia  . Stroke  . Constipation  . Chronic back pain  . Peripheral neuropathy  . CVA (cerebral infarction)  . Dysphagia following cerebrovascular accident  . Left hemiplegia    Past Medical History:  Past Medical History  Diagnosis Date  . ALLERGIC RHINITIS   . CAD, NATIVE VESSEL     BMS to OM1 2001, DES to BMS 2005  . DIAB W/UNSPEC COMP TYPE II/UNSPEC TYPE UNCNTRL   . ERECTILE DYSFUNCTION   . HYPERLIPIDEMIA-MIXED     takes Lipitor daily  . MORTON'S NEUROMA, RIGHT   . SHOULDER PAIN, RIGHT   . PONV (postoperative nausea and vomiting)   . HYPERTENSION, BENIGN     takes Lisinopril and Atenolol daily  . Coughing   . Shortness of breath     lying/sitting/exertion  . Headache     occasionally  . Peripheral neuropathy   . Arthritis   . Joint pain   . Joint swelling   . Chronic back pain     herniated disc  . GERD     takes Protonix daily  . Constipation     takes Carafate four times day  . Seasonal allergies     takes Allegra and Benadryl daily prn;uses Flonase daily  . Diabetes mellitus     on insulin  . Insomnia     doesn't require meds  . TIA on medication 02/2012    takes Plavix daily;on hold for surgery;rt sided weakness   Past Surgical History:  Past Surgical History  Procedure Date  . Stent 2001, 2004    coronary stents    . Angioplasty   . Left knee surgury     x 2   . Dental surgery   . Coronary angioplasty 2005    2 stents  . Colonoscopy   . Laryngoplasty 08/07/2012    Procedure: LARYNGOPLASTY;  Surgeon: Izora Gala, MD;  Location: [facility];  Service: ENT;  Laterality: Left;  Left Vocal Cord Medialyzation    Assessment & Plan Clinical Impression: [name] is a 61 y.o. RH-male with a history of TIA, diabetes mellitus, hyperlipidemia, recent laryngoplasty 9/16 who presented with acute recurrent weakness and numbness involving his left side with facial weakness on 08/14/12 MRI brain done revealing acute infarct right thalamic/posterior limb of IC, global atrophy without hydro and intracranial atherosclerosis. Patient transferred to CIR on 08/18/2012 .   Patient currently reports lightheadedness/imbalance with bed mobility and generalized mobility, vestibular evaluation requested. Pt reports he does not typically have "room spinning" dizziness however does have lightheadedness/imbalance with rolling in bed, side to sit and sit <> stands. Pt reports he has noticed difficulty with his eyes focusing while reading, reports they jump to different lines with reading. After evaluation pt appears to be having imbalance/dizziness which he reports as lightheadedness or "feeling funny" as a result of impaired VOR as  well as disconjugate eye movements, worse with near targets. Pt appears to have purely central deficits however PT was unable to test positionally appropriately secondary to pt's back pain, modified versions were negative.   Patient will benefit from skilled vestibular PT intervention to maximize safe functional mobility, minimize fall risk and decrease caregiver burden for discharge. Anticipate patient will benefit from [facility] vs. OP vestibular therapy in conjunction with traditional therapy to address vestibular and neuromotor deficits.  at discharge.   PT Recommendations:  - Pt started on x 1 VOR stabilization  exercises with letter "A" on wall, pt to be ~4' away from wall. Pt to only allow dizziness to increase 2-3/10. Pt is ~0/10 dizziness at baseline, pt should perform exercises up to 2-3/10. Pt requires verbal cues for focusing on target, particularly with head movement to the Rt. Will progress pt to closer distance.   - Pt will benefit from longer distance visual targets with mobility and position change, near targets do not improve dizziness at this point. For example when pt in gym performing bed/mat mobility have pt focus on large colored balls halfway down gym. Eventually working to closer targets.       PT Evaluation  Eye Alignment  WFL  Spontaneous  Nystagmus Not present  Gaze holding nystagmus Not present  Smooth pursuit Impaired, worse to Rt., worse at near distance   Oculomotor Pt reports double vision with upper Lt. Vision, and midline with near distance, appears WFL at 3-4' distance, no double vision. + for dizziness with double vision.  Saccades WFL  VOR slow Significantly impaired particularly with target focus at near distance.   Head Thrust Test Unable to appropriately perform test  Head Shaking Nystagmus Unable to perform, pt unable to focus on target.   RtMarye Round *Modified secondary to back pain. Pt with lightheadedness, no nystagmus noted.   LtMarye Round *Modified secondary to back pain. Pt with lightheadedness, no nystagmus noted.  Rt. Roll Test Not tested  Lt. Roll Test  Not tested  Motion sensitivity ? Unclear      Visual- Vestibular Interactions:  - Sitting: positive dizziness and reports of the target "jumping" some nystagmus noted, changes direction (consistent with central deficits).  - Standing: unable to test  Orthostatic BPs  Supine 114/77  Sitting 133/95  Standing 145/91  Return to sitting 133/87    Precautions/Restrictions  Fall   Pain Pain Assessment Pain Assessment: No/denies pain Pain Score:   6  Cognition Orientation Level:  Oriented X4  See FIM for current functional status Refer to Care Plan for Long Term Goals  Recommendations for other services: None  Discharge Criteria: Patient will be discharged from PT if patient refuses treatment 3 consecutive times without medical reason, if treatment goals not met, if there is a change in medical status, if patient makes no progress towards goals or if patient is discharged from hospital.  The above assessment, treatment plan, treatment alternatives and goals were discussed and mutually agreed upon: by patient  Lahoma Rocker 08/22/2012, 12:31 PM

## 2012-08-22 NOTE — Progress Notes (Signed)
Patient ID: [name], male   DOB: Jun 30, 1951, 61 y.o.   MRN: [id number] Subjective/Complaints: [name] is a 61 y.o. RH-male with a history of TIA, diabetes mellitus, hyperlipidemia, recent laryngoplasty 9/16 who presented with acute recurrent weakness and numbness involving his left side with facial weakness on 08/14/12 had a TIA with similar presentation in May 2013. Workup was unremarkable and patient placed on plavix for CVA prophylaxis. CT head negative. 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 2D echo done revealing EF 50-55% with grade 1 diastolic dysfunction. Carotid dopplers without ICA stenosis. Patient had worsening of symptoms past admission. 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 FEES done (patient has had problems with aspiration) and placed on D2, thin liquids with hard swallow. He is developing left sided spasms. 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   Mild nausea Mild dizziness Review of Systems  Constitutional: Positive for malaise/fatigue.  HENT:       Vocal hoarseness  Respiratory: Positive for cough. Negative for sputum production, shortness of breath and wheezing.   Gastrointestinal: Positive for nausea. Negative for vomiting.  All other systems reviewed and are negative.   Objective: Vital Signs: Blood pressure 124/77, pulse 67, temperature 98 F (36.7 C), temperature source Oral, resp. rate 18, height 5\' 11"  (1.803 m), weight 91.5 kg (201 lb 11.5 oz), SpO2 100.00%. No results found. Results for orders placed during the hospital encounter of 08/18/12 (from the past 72 hour(s))  GLUCOSE, CAPILLARY     Status: Abnormal   Collection Time   08/19/12 12:00 PM      Component Value Range Comment   Glucose-Capillary 297 (*) 70 - 99 mg/dL    Comment 1 Notify RN     GLUCOSE, CAPILLARY     Status:  Abnormal   Collection Time   08/19/12  4:57 PM      Component Value Range Comment   Glucose-Capillary 109 (*) 70 - 99 mg/dL   GLUCOSE, CAPILLARY     Status: Abnormal   Collection Time   08/19/12  9:06 PM      Component Value Range Comment   Glucose-Capillary 156 (*) 70 - 99 mg/dL   GLUCOSE, CAPILLARY     Status: Abnormal   Collection Time   08/20/12  7:37 AM      Component Value Range Comment   Glucose-Capillary 151 (*) 70 - 99 mg/dL    Comment 1 Notify RN      Comment 2 Documented in Chart     GLUCOSE, CAPILLARY     Status: Abnormal   Collection Time   08/20/12 12:09 PM      Component Value Range Comment   Glucose-Capillary 195 (*) 70 - 99 mg/dL    Comment 1 Documented in Chart     GLUCOSE, CAPILLARY     Status: Abnormal   Collection Time   08/20/12  4:35 PM      Component Value Range Comment   Glucose-Capillary 139 (*) 70 - 99 mg/dL    Comment 1 Notify RN     GLUCOSE, CAPILLARY     Status: Abnormal   Collection Time   08/20/12  6:45 PM      Component Value Range Comment   Glucose-Capillary 167 (*) 70 - 99 mg/dL    Comment 1 Documented in Chart     GLUCOSE, CAPILLARY     Status: Abnormal  Collection Time   08/20/12  8:30 PM      Component Value Range Comment   Glucose-Capillary 241 (*) 70 - 99 mg/dL    Comment 1 Notify RN     COMPREHENSIVE METABOLIC PANEL     Status: Abnormal   Collection Time   08/21/12  5:38 AM      Component Value Range Comment   Sodium 139  135 - 145 mEq/L    Potassium 3.5  3.5 - 5.1 mEq/L    Chloride 100  96 - 112 mEq/L    CO2 32  19 - 32 mEq/L    Glucose, Bld 148 (*) 70 - 99 mg/dL    BUN 16  6 - 23 mg/dL    Creatinine, Ser 1.36 (*) 0.50 - 1.35 mg/dL    Calcium 9.7  8.4 - 10.5 mg/dL    Total Protein 7.2  6.0 - 8.3 g/dL    Albumin 3.6  3.5 - 5.2 g/dL    AST 54 (*) 0 - 37 U/L    ALT 65 (*) 0 - 53 U/L    Alkaline Phosphatase 58  39 - 117 U/L    Total Bilirubin 0.8  0.3 - 1.2 mg/dL    GFR calc non Af Amer 54 (*) >90 mL/min    GFR calc Af Amer 63  (*) >90 mL/min   GLUCOSE, CAPILLARY     Status: Abnormal   Collection Time   08/21/12  7:32 AM      Component Value Range Comment   Glucose-Capillary 160 (*) 70 - 99 mg/dL    Comment 1 Notify RN     GLUCOSE, CAPILLARY     Status: Abnormal   Collection Time   08/21/12  9:30 AM      Component Value Range Comment   Glucose-Capillary 171 (*) 70 - 99 mg/dL    Comment 1 Notify RN     GLUCOSE, CAPILLARY     Status: Abnormal   Collection Time   08/21/12 11:22 AM      Component Value Range Comment   Glucose-Capillary 131 (*) 70 - 99 mg/dL   GLUCOSE, CAPILLARY     Status: Abnormal   Collection Time   08/21/12  2:43 PM      Component Value Range Comment   Glucose-Capillary 179 (*) 70 - 99 mg/dL    Comment 1 Notify RN     GLUCOSE, CAPILLARY     Status: Abnormal   Collection Time   08/21/12  4:33 PM      Component Value Range Comment   Glucose-Capillary 184 (*) 70 - 99 mg/dL   CBC WITH DIFFERENTIAL     Status: Abnormal   Collection Time   08/21/12  6:35 PM      Component Value Range Comment   WBC 6.2  4.0 - 10.5 K/uL    RBC 4.24  4.22 - 5.81 MIL/uL    Hemoglobin 12.9 (*) 13.0 - 17.0 g/dL    HCT 38.4 (*) 39.0 - 52.0 %    MCV 90.6  78.0 - 100.0 fL    MCH 30.4  26.0 - 34.0 pg    MCHC 33.6  30.0 - 36.0 g/dL    RDW 12.5  11.5 - 15.5 %    Platelets 216  150 - 400 K/uL    Neutrophils Relative 50  43 - 77 %    Neutro Abs 3.1  1.7 - 7.7 K/uL    Lymphocytes Relative 36  12 - 46 %  Lymphs Abs 2.3  0.7 - 4.0 K/uL    Monocytes Relative 10  3 - 12 %    Monocytes Absolute 0.6  0.1 - 1.0 K/uL    Eosinophils Relative 3  0 - 5 %    Eosinophils Absolute 0.2  0.0 - 0.7 K/uL    Basophils Relative 0  0 - 1 %    Basophils Absolute 0.0  0.0 - 0.1 K/uL   GLUCOSE, CAPILLARY     Status: Abnormal   Collection Time   08/21/12  7:51 PM      Component Value Range Comment   Glucose-Capillary 127 (*) 70 - 99 mg/dL   GLUCOSE, CAPILLARY     Status: Abnormal   Collection Time   08/22/12  7:38 AM      Component  Value Range Comment   Glucose-Capillary 138 (*) 70 - 99 mg/dL    Comment 1 STAT Lab      Comment 2 Notify RN        HEENT: horse voice Cardio: RRR Resp: CTA B/L GI: BS positive Extremity:  No Edema Skin:   Intact Neuro: Alert/Oriented, Anxious, Abnormal Sensory reduced on left, Abnormal Motor 2 minus left deltoid bicep tricep grip as well as left hip flexor knee extensor 1 ankle dorsiflexor plantar flexor and Abnormal FMC Ataxic/ dec FMC and Tone  increased tone left abductors and extensors in the lower extremity Musc/Skel:  Other pain with resisted hip abduction. No tenderness over the lateral hip. No tenderness over the knee. Tenderness over the  medial hip adductors peripheral pulses normal, normal skin temperature  in the feet   Assessment/Plan: 1. 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 Physiatrist is providing close team supervision and 24 hour management of active medical problems listed below. Physiatrist and rehab team continue to assess barriers to discharge/monitor patient progress toward functional and medical goals. FIM: FIM - Bathing Bathing Steps Patient Completed: Chest;Left Arm;Abdomen;Front perineal area;Buttocks;Right upper leg;Left upper leg;Right lower leg (including foot) Bathing: 4: Min-Patient completes 8-9 90f 10 parts or 75+ percent  FIM - Upper Body Dressing/Undressing Upper body dressing/undressing steps patient completed: Thread/unthread right sleeve of pullover shirt/dresss;Put head through opening of pull over shirt/dress;Pull shirt over trunk Upper body dressing/undressing: 4: Min-Patient completed 75 plus % of tasks FIM - Lower Body Dressing/Undressing Lower body dressing/undressing steps patient completed: Thread/unthread right pants  leg;Thread/unthread right underwear leg;Thread/unthread left underwear leg;Thread/unthread left pants leg;Don/Doff right sock;Don/Doff right shoe Lower body dressing/undressing: 3: Mod-Patient completed 50-74% of tasks  FIM - Toileting Toileting: 0: Activity did not occur  FIM - Air cabin crew Transfers: 0-Activity did not occur  FIM - Control and instrumentation engineer Devices: Walker;Bed rails;Arm rests Bed/Chair Transfer: 3: Sit > Supine: Mod A (lifting assist/Pt. 50-74%/lift 2 legs);3: Bed > Chair or W/C: Mod A (lift or lower assist)  FIM - Locomotion: Wheelchair Distance: 30 Locomotion: Wheelchair: 4: Travels 150 ft or more: maneuvers on rugs and over door sillls with minimal assistance (Pt.>75%) FIM - Locomotion: Ambulation Locomotion: Ambulation Assistive Devices: Engineer, agricultural Ambulation/Gait Assistance: 2: Max assist Locomotion: Ambulation: 1: Travels less than 50 ft with maximal assistance (Pt: 25 - 49%)  Comprehension Comprehension Mode: Auditory Comprehension: 7-Follows complex conversation/direction: With no assist  Expression Expression Mode: Verbal Expression: 6-Expresses complex ideas: With extra time/assistive device  Social Interaction Social Interaction: 6-Interacts appropriately with others with medication or extra time (anti-anxiety, antidepressant).  Problem Solving Problem Solving Mode: Not assessed Problem Solving: 6-Solves complex problems: With extra time  Memory Memory Mode: Not assessed Memory: 7-Complete Independence: No helper   Medical Problem List and Plan:  1. DVT Prophylaxis/Anticoagulation: Pharmaceutical: Lovenox  2. Pain Management: tramadol, tylenol. May utilize heat or ice for rib cage discomfort  -neurontin added for ?radicular C-spine pain per neuro- observe for effect, titrate as appropriate. 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 Mood: egosupport by team. He is fairly motivated and seems to be in good spirits  4. Neuropsych: This patient is capable of making decisions on his/her own behalf.  5. HTN: Monitor with bid checks. Avoid hypotension for now. Continue atenolol, HCTZ and lisinopril.  6. Constipation (acute on chronic):this may be contribute to nausea. scheduled softener/laxative, suppository or enema if needed  7. Thrush: nystatin mouth wash  8. Hyperlipidemia: zocor 10mg  daily 9. 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 His swallowing improved postoperatively and has not noticeably worsened post stroke.Continues to have a cough which may be either a low-grade aspiration or side effect of his ACE inhibitor 10. Urinary hesitancy DC diphenhydramine and trazodone,monitor, avoid  Anticholinergic agents  LOS (Days) 4 A FACE TO FACE EVALUATION WAS PERFORMED  Delfina Schreurs E 08/22/2012, 8:50 AM

## 2012-08-22 NOTE — Progress Notes (Signed)
Speech Language Pathology Daily Session Note  Patient Details  Name: [name] MRN: [id number] Date of Birth: December 15, 1950  Today's Date: 08/22/2012 Time: [id number] Time Calculation (min): 25 min  Short Term Goals: Week 1: SLP Short Term Goal 1 (Week 1): Pt. will implement speech strategies in conversation in a moderately noisy environment with min verbal cues.  SLP Short Term Goal 1 - Progress (Week 1): Progressing toward goal SLP Short Term Goal 2 (Week 1): Pt. will safely consume a Dys 3 diet and thin liquids with min verbal cues for swallow precautions.  SLP Short Term Goal 2 - Progress (Week 1): Progressing toward goal SLP Short Term Goal 3 (Week 1): Pt. will demonstrate executive functions during complex activities involving planning/organizing/self monitoring and correcting with min subtle cues.   SLP Short Term Goal 3 - Progress (Week 1): Met  Skilled Therapeutic Interventions: Treatment session focused on addressing dysphagia goals.  RN administered medication whole with liquid and patient performed use of compensatory strategies with modified independence.  SLP performed skilled observation and no overt s/s of aspiration occurred.  As a result, SLP recommends upgrade to medication whole with thin liquids and SLP will follow for toleration.  SLP also facilitated session with questions regarding treatments in other therapy session and patient was able to recall and describe in detail procedures/exercises to manage dizziness.  SLP recommends to further cognition treatment at this time due to accurate carryover of new, complex information.     FIM:  Comprehension Comprehension Mode: Auditory Comprehension: 6-Follows complex conversation/direction: With extra time/assistive device Expression Expression Mode: Verbal Expression: 5-Expresses complex 90% of the time/cues < 10% of the time Social Interaction Social Interaction: 6-Interacts appropriately with others with medication  or extra time (anti-anxiety, antidepressant). Problem Solving Problem Solving: 6-Solves complex problems: With extra time Memory Memory: 6-More than reasonable amt of time  Pain Pain Assessment Pain Assessment: No/denies pain  Therapy/Group: Individual Therapy  Carmelia Roller., CCC-SLP [id number]  Max 08/22/2012, 5:38 PM

## 2012-08-22 NOTE — Progress Notes (Signed)
Occupational Therapy Session Note  Patient Details  Name: [name] MRN: [id number] Date of Birth: [birth year]  Today's Date: 08/22/2012 Time: 0800-0905 and 1300-1330 Time Calculation (min): 65 min and 30 min  Short Term Goals: Week 1:  OT Short Term Goal 1 (Week 1): Pt will perform UB bathing with min assist for washing the right arm using the LUE. OT Short Term Goal 2 (Week 1): Pt will perfrom UB dressing with supervision unsupported sitting. OT Short Term Goal 3 (Week 1): Pt will perform LB dressing with min assist sit to stand to donn pants only. OT Short Term Goal 4 (Week 1): Pt will use the LUE as an active assist for selfcare and grooming tasks with no more than min instructional cueing and mod facilitation.  Skilled Therapeutic Interventions/Progress Updates:  1)  Self care retraining to include shower, dress and groom.  Focus session on bed mobility, safe bed>w/c><tub bench in shower transfers, activity tolerance, forced use of LUE with deliberate, slow, and controlled movements, dynamic sitting and standing balance, sit><stand and improving success with LUE by watching the hand while it is at work.  Patient's extensor tone/spacitiy increases in LUE & LLE with effort is exerted and his decreased proprioception can be a safety concern.   2)  Recliner>w/c squat pivot transfer, dynamic standing balance at toilet to void to include pants down and up, sit><stand at sink to wash hands with focus on functional use of LUE and postural control with use of mirror to self correct and guide, w/c>bed squat pivot transfer and bed mobility.  Patient reports being tired this PM and asked to rest before next therapy.  Therapy Documentation Precautions:  Precautions Precautions: Fall Precaution Comments: left UE and LE hemiparesis Restrictions Weight Bearing Restrictions: No Pain: Denies pain except with left knee (premorbid injury) See FIM for current functional status  Therapy/Group:  Individual Therapy  Hopie Pellegrin 08/22/2012, 3:47 PM

## 2012-08-22 NOTE — Progress Notes (Signed)
Social Work Assessment and Plan Social Work Assessment and Plan  Patient Details  Name: [name] MRN: [id number] Date of Birth: [birth year]  Today's Date: 08/22/2012  Problem List:  Patient Active Problem List  Diagnosis  . DIABETES MELLITUS, TYPE II, UNCONTROLLED  . HYPERLIPIDEMIA-MIXED  . ERECTILE DYSFUNCTION  . MORTON'S NEUROMA, RIGHT  . HYPERTENSION, BENIGN  . CAD, NATIVE VESSEL  . ALLERGIC RHINITIS  . GERD  . SHOULDER PAIN, RIGHT  . Headache  . TOBACCO USE, QUIT  . Preventative health care  . TIA (transient ischemic attack)  . Dysphagia  . Stroke  . Constipation  . Chronic back pain  . Peripheral neuropathy  . CVA (cerebral infarction)  . Dysphagia following cerebrovascular accident  . Left hemiplegia   Past Medical History:  Past Medical History  Diagnosis Date  . ALLERGIC RHINITIS   . CAD, NATIVE VESSEL     BMS to OM1 2001, DES to BMS 2005  . DIAB W/UNSPEC COMP TYPE II/UNSPEC TYPE UNCNTRL   . ERECTILE DYSFUNCTION   . HYPERLIPIDEMIA-MIXED     takes Lipitor daily  . MORTON'S NEUROMA, RIGHT   . SHOULDER PAIN, RIGHT   . PONV (postoperative nausea and vomiting)   . HYPERTENSION, BENIGN     takes Lisinopril and Atenolol daily  . Coughing   . Shortness of breath     lying/sitting/exertion  . Headache     occasionally  . Peripheral neuropathy   . Arthritis   . Joint pain   . Joint swelling   . Chronic back pain     herniated disc  . GERD     takes Protonix daily  . Constipation     takes Carafate four times day  . Seasonal allergies     takes Allegra and Benadryl daily prn;uses Flonase daily  . Diabetes mellitus     on insulin  . Insomnia     doesn't require meds  . TIA on medication 02/2012    takes Plavix daily;on hold for surgery;rt sided weakness   Past Surgical History:  Past Surgical History  Procedure Date  . Stent 2001, 2004    coronary stents  . Angioplasty   . Left knee surgury     x 2   . Dental surgery   . Coronary  angioplasty 2005    2 stents  . Colonoscopy   . Laryngoplasty 08/07/2012    Procedure: LARYNGOPLASTY;  Surgeon: Izora Gala, MD;  Location: [facility];  Service: ENT;  Laterality: Left;  Left Vocal Cord Medialyzation   Social History:  reports that he quit smoking about 29 years ago. He has never used smokeless tobacco. He reports that he does not drink alcohol or use illicit drugs.  Family / Support Systems Marital Status: Married Patient Roles: Parent;Other (Comment) (Student) Spouse/Significant Other: [name] [phone] Children: [name]-daughter  [phone] Other Supports: Sister and brother in-law Anticipated Caregiver: Family members and wife she has back issues Ability/Limitations of Caregiver: Wife has back issues and is limited to what she can do Caregiver Availability: 24/7 Family Dynamics: Close knit family who does for one another and will do whatever pt eneds at discharge.  But alos they have high expectations for him here.  Social History Preferred language: English Religion: Catholic Cultural Background: No issues Education: Interior and spatial designer Read: Yes Write: Yes Employment Status: Retired Date Retired/Disabled/Unemployed: 2003 Freight forwarder Issues: No issues Guardian/Conservator: None-according  to MD pt is capable of making his won decisions, but will also look toward wife  Abuse/Neglect Physical Abuse: Denies Verbal Abuse: Denies Sexual Abuse: Denies Exploitation of patient/patient's resources: Denies Self-Neglect: Denies  Emotional Status Pt's affect, behavior adn adjustment status: Pt has been so sick with dizziness and nausea, difficult to participate in therapies yesterday.  He feels somewhat better today. Recent Psychosocial Issues: Other medical issues Pyschiatric History: No hisotry-deferred depression screen due to not feeling well after therapy Substance Abuse History: No issues-past smoker  Patient / Family Perceptions, Expectations &  Goals Pt/Family understanding of illness & functional limitations: Wife can explain husband's stroke and deficits.  Pt can report he had a stroke.  Both have a basic understanding of his condition. Premorbid pt/family roles/activities: Husband, Father, Grandfather, Retiree, Ship broker, etc Anticipated changes in roles/activities/participation: resume Pt/family expectations/goals: Pt states: " I just want to feel better so I can do therapy."  Wife reprots: " He has got to feel better and eat something to get better, they go to figure out something."  [employer]: None Premorbid Home Care/DME Agencies: None Transportation available at discharge: [employer] referrals recommended: Support group (specify) ([facility]A Support group)  Discharge Planning Living Arrangements: Spouse/significant other Support Systems: Spouse/significant other;Children;Other relatives;Friends/neighbors;Church/faith community Type of Residence: Private residence Insurance Resources: [place] (specify) Youth worker) Financial Resources: Charity fundraiser (Comment) Emergency planning/management officer) Living Expenses: Lives with family Money Management: Spouse;Patient Do you have any problems obtaining your medications?: No Home Management: Wife Patient/Family Preliminary Plans: Return home with wife and other family members to assist at discharge.  Wife has physical limitations with her back issues. Social Work Anticipated Follow Up Needs: HH/OP;Support Group  Clinical Impression Pleasant gentleman who really wants to work but is nauseated from where his stroke was.  Very supportive wife and family members, encouraged her to observe pt in therapies when here. Provide support and wife wants to be kept informed regarding pt's condition. Elease Hashimoto 08/22/2012, 10:47 AM

## 2012-08-23 ENCOUNTER — Inpatient Hospital Stay (HOSPITAL_COMMUNITY): Payer: Medicare Other | Admitting: Occupational Therapy

## 2012-08-23 ENCOUNTER — Inpatient Hospital Stay (HOSPITAL_COMMUNITY): Payer: Medicare Other | Admitting: Speech Pathology

## 2012-08-23 ENCOUNTER — Inpatient Hospital Stay (HOSPITAL_COMMUNITY): Payer: Medicare Other | Admitting: Physical Therapy

## 2012-08-23 LAB — GLUCOSE, CAPILLARY
Glucose-Capillary: 138 mg/dL — ABNORMAL HIGH (ref 70–99)
Glucose-Capillary: 162 mg/dL — ABNORMAL HIGH (ref 70–99)
Glucose-Capillary: 136 mg/dL — ABNORMAL HIGH (ref 70–99)
Glucose-Capillary: 123 mg/dL — ABNORMAL HIGH (ref 70–99)

## 2012-08-23 LAB — BASIC METABOLIC PANEL
BUN: 14 mg/dL (ref 6–23)
CO2: 33 mEq/L — ABNORMAL HIGH (ref 19–32)
Calcium: 9.6 mg/dL (ref 8.4–10.5)
Chloride: 97 mEq/L (ref 96–112)
Creatinine, Ser: 1.27 mg/dL (ref 0.50–1.35)
GFR calc Af Amer: 68 mL/min — ABNORMAL LOW (ref >90)
GFR calc non Af Amer: 59 mL/min — ABNORMAL LOW (ref >90)
Glucose, Bld: 140 mg/dL — ABNORMAL HIGH (ref 70–99)
Potassium: 3.3 mEq/L — ABNORMAL LOW (ref 3.5–5.1)
Sodium: 137 mEq/L (ref 135–145)

## 2012-08-23 MED ORDER — POTASSIUM CHLORIDE CRYS ER 10 MEQ PO TBCR
10.0000 meq | EXTENDED_RELEASE_TABLET | Freq: Two times a day (BID) | ORAL | Status: DC
  Administered 2012-08-23 – 2012-09-07 (×31): 10 meq via ORAL
  Filled 2012-08-23 (×35): qty 1

## 2012-08-23 NOTE — Progress Notes (Signed)
Social Work Patient ID: [name], male   DOB: Aug 21, 1951, 61 y.o.   MRN: [id number] Met with pt and wife to inform of team conference goals-supervision level and discharge 10/17.  Both pleased with plans and pt feels better each day. Wife aware closer to when pt discharged need to come in and attend therapies with pt.  Continue to work toward discharge date.

## 2012-08-23 NOTE — Progress Notes (Signed)
Patient ID: [name], male   DOB: 13-Dec-1950, 61 y.o.   MRN: [id number] Subjective/Complaints: [name] is a 61 y.o. RH-male with a history of TIA, diabetes mellitus, hyperlipidemia, recent laryngoplasty 9/16 who presented with acute recurrent weakness and numbness involving his left side with facial weakness on 08/14/12 had a TIA with similar presentation in May 2013. Workup was unremarkable and patient placed on plavix for CVA prophylaxis. CT head negative. 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 2D echo done revealing EF 50-55% with grade 1 diastolic dysfunction. Carotid dopplers without ICA stenosis. Patient had worsening of symptoms past admission. 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 FEES done (patient has had problems with aspiration) and placed on D2, thin liquids with hard swallow. He is developing left sided spasms. 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   Gets tired after therapy Review of Systems  Constitutional: Positive for malaise/fatigue.  HENT:       Vocal hoarseness  Respiratory: Positive for cough. Negative for sputum production, shortness of breath and wheezing.   Gastrointestinal: Positive for nausea. Negative for vomiting.  All other systems reviewed and are negative.   Objective: Vital Signs: Blood pressure 126/81, pulse 67, temperature 98.2 F (36.8 C), temperature source Oral, resp. rate 19, height 5\' 11"  (1.803 m), weight 91.5 kg (201 lb 11.5 oz), SpO2 98.00%. No results found. Results for orders placed during the hospital encounter of 08/18/12 (from the past 72 hour(s))  GLUCOSE, CAPILLARY     Status: Abnormal   Collection Time   08/20/12 12:09 PM      Component Value Range Comment   Glucose-Capillary 195 (*) 70 - 99 mg/dL    Comment 1 Documented in Chart     GLUCOSE, CAPILLARY      Status: Abnormal   Collection Time   08/20/12  4:35 PM      Component Value Range Comment   Glucose-Capillary 139 (*) 70 - 99 mg/dL    Comment 1 Notify RN     GLUCOSE, CAPILLARY     Status: Abnormal   Collection Time   08/20/12  6:45 PM      Component Value Range Comment   Glucose-Capillary 167 (*) 70 - 99 mg/dL    Comment 1 Documented in Chart     GLUCOSE, CAPILLARY     Status: Abnormal   Collection Time   08/20/12  8:30 PM      Component Value Range Comment   Glucose-Capillary 241 (*) 70 - 99 mg/dL    Comment 1 Notify RN     COMPREHENSIVE METABOLIC PANEL     Status: Abnormal   Collection Time   08/21/12  5:38 AM      Component Value Range Comment   Sodium 139  135 - 145 mEq/L    Potassium 3.5  3.5 - 5.1 mEq/L    Chloride 100  96 - 112 mEq/L    CO2 32  19 - 32 mEq/L    Glucose, Bld 148 (*) 70 - 99 mg/dL    BUN 16  6 - 23 mg/dL    Creatinine, Ser 1.36 (*) 0.50 - 1.35 mg/dL    Calcium 9.7  8.4 - 10.5 mg/dL    Total Protein 7.2  6.0 - 8.3 g/dL    Albumin 3.6  3.5 - 5.2 g/dL    AST 54 (*) 0 - 37 U/L  ALT 65 (*) 0 - 53 U/L    Alkaline Phosphatase 58  39 - 117 U/L    Total Bilirubin 0.8  0.3 - 1.2 mg/dL    GFR calc non Af Amer 54 (*) >90 mL/min    GFR calc Af Amer 63 (*) >90 mL/min   GLUCOSE, CAPILLARY     Status: Abnormal   Collection Time   08/21/12  7:32 AM      Component Value Range Comment   Glucose-Capillary 160 (*) 70 - 99 mg/dL    Comment 1 Notify RN     GLUCOSE, CAPILLARY     Status: Abnormal   Collection Time   08/21/12  9:30 AM      Component Value Range Comment   Glucose-Capillary 171 (*) 70 - 99 mg/dL    Comment 1 Notify RN     GLUCOSE, CAPILLARY     Status: Abnormal   Collection Time   08/21/12 11:22 AM      Component Value Range Comment   Glucose-Capillary 131 (*) 70 - 99 mg/dL   GLUCOSE, CAPILLARY     Status: Abnormal   Collection Time   08/21/12  2:43 PM      Component Value Range Comment   Glucose-Capillary 179 (*) 70 - 99 mg/dL    Comment 1 Notify  RN     GLUCOSE, CAPILLARY     Status: Abnormal   Collection Time   08/21/12  4:33 PM      Component Value Range Comment   Glucose-Capillary 184 (*) 70 - 99 mg/dL   CBC WITH DIFFERENTIAL     Status: Abnormal   Collection Time   08/21/12  6:35 PM      Component Value Range Comment   WBC 6.2  4.0 - 10.5 K/uL    RBC 4.24  4.22 - 5.81 MIL/uL    Hemoglobin 12.9 (*) 13.0 - 17.0 g/dL    HCT 38.4 (*) 39.0 - 52.0 %    MCV 90.6  78.0 - 100.0 fL    MCH 30.4  26.0 - 34.0 pg    MCHC 33.6  30.0 - 36.0 g/dL    RDW 12.5  11.5 - 15.5 %    Platelets 216  150 - 400 K/uL    Neutrophils Relative 50  43 - 77 %    Neutro Abs 3.1  1.7 - 7.7 K/uL    Lymphocytes Relative 36  12 - 46 %    Lymphs Abs 2.3  0.7 - 4.0 K/uL    Monocytes Relative 10  3 - 12 %    Monocytes Absolute 0.6  0.1 - 1.0 K/uL    Eosinophils Relative 3  0 - 5 %    Eosinophils Absolute 0.2  0.0 - 0.7 K/uL    Basophils Relative 0  0 - 1 %    Basophils Absolute 0.0  0.0 - 0.1 K/uL   GLUCOSE, CAPILLARY     Status: Abnormal   Collection Time   08/21/12  7:51 PM      Component Value Range Comment   Glucose-Capillary 127 (*) 70 - 99 mg/dL   GLUCOSE, CAPILLARY     Status: Abnormal   Collection Time   08/22/12  7:38 AM      Component Value Range Comment   Glucose-Capillary 138 (*) 70 - 99 mg/dL    Comment 1 STAT Lab      Comment 2 Notify RN     GLUCOSE, CAPILLARY  Status: Abnormal   Collection Time   08/22/12 11:41 AM      Component Value Range Comment   Glucose-Capillary 204 (*) 70 - 99 mg/dL    Comment 1 Notify RN     GLUCOSE, CAPILLARY     Status: Abnormal   Collection Time   08/22/12 11:50 AM      Component Value Range Comment   Glucose-Capillary 181 (*) 70 - 99 mg/dL    Comment 1 Notify RN     GLUCOSE, CAPILLARY     Status: Abnormal   Collection Time   08/22/12  4:26 PM      Component Value Range Comment   Glucose-Capillary 110 (*) 70 - 99 mg/dL   GLUCOSE, CAPILLARY     Status: Abnormal   Collection Time   08/22/12  9:05 PM        Component Value Range Comment   Glucose-Capillary 129 (*) 70 - 99 mg/dL   BASIC METABOLIC PANEL     Status: Abnormal   Collection Time   08/23/12  6:20 AM      Component Value Range Comment   Sodium 137  135 - 145 mEq/L    Potassium 3.3 (*) 3.5 - 5.1 mEq/L    Chloride 97  96 - 112 mEq/L    CO2 33 (*) 19 - 32 mEq/L    Glucose, Bld 140 (*) 70 - 99 mg/dL    BUN 14  6 - 23 mg/dL    Creatinine, Ser 1.27  0.50 - 1.35 mg/dL    Calcium 9.6  8.4 - 10.5 mg/dL    GFR calc non Af Amer 59 (*) >90 mL/min    GFR calc Af Amer 68 (*) >90 mL/min   GLUCOSE, CAPILLARY     Status: Abnormal   Collection Time   08/23/12  7:28 AM      Component Value Range Comment   Glucose-Capillary 138 (*) 70 - 99 mg/dL    Comment 1 Notify RN        HEENT: horse voice Cardio: RRR Resp: CTA B/L GI: BS positive Extremity:  No Edema Skin:   Intact Neuro: Alert/Oriented, Anxious, Abnormal Sensory reduced on left, Abnormal Motor 2 minus left deltoid bicep tricep grip as well as left hip flexor knee extensor 1 ankle dorsiflexor plantar flexor and Abnormal FMC Ataxic/ dec FMC and Tone  increased tone left abductors and extensors in the lower extremity Musc/Skel:  Other pain with resisted hip abduction. No tenderness over the lateral hip. No tenderness over the knee. Tenderness over the  medial hip adductors peripheral pulses normal, normal skin temperature  in the feet   Assessment/Plan: 1. 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 Physiatrist is providing close team supervision and 24 hour management of active medical problems listed below. Physiatrist and rehab team continue to assess barriers to discharge/monitor patient progress toward functional and medical goals. FIM: FIM - Bathing Bathing  Steps Patient Completed: Chest;Left Arm;Abdomen;Front perineal area;Buttocks;Right upper leg;Left upper leg;Right lower leg (including foot) Bathing: 4: Min-Patient completes 8-9 33f 10 parts or 75+ percent  FIM - Upper Body Dressing/Undressing Upper body dressing/undressing steps patient completed: Thread/unthread right sleeve of pullover shirt/dresss;Put head through opening of pull over shirt/dress;Pull shirt over trunk Upper body dressing/undressing: 4: Min-Patient completed 75 plus % of tasks FIM - Lower  Body Dressing/Undressing Lower body dressing/undressing steps patient completed: Thread/unthread right pants leg;Thread/unthread right underwear leg;Thread/unthread left underwear leg;Thread/unthread left pants leg;Don/Doff right shoe Lower body dressing/undressing: 3: Mod-Patient completed 50-74% of tasks  FIM - Toileting Toileting: 0: Activity did not occur  FIM - Radio producer Devices: Recruitment consultant Transfers: 0-Activity did not occur  FIM - Control and instrumentation engineer Devices: Environmental consultant;Bed rails;Arm rests Bed/Chair Transfer: 4: Supine > Sit: Min A (steadying Pt. > 75%/lift 1 leg);4: Sit > Supine: Min A (steadying pt. > 75%/lift 1 leg);4: Bed > Chair or W/C: Min A (steadying Pt. > 75%);4: Chair or W/C > Bed: Min A (steadying Pt. > 75%)  FIM - Locomotion: Wheelchair Distance: 30 Locomotion: Wheelchair: 4: Travels 150 ft or more: maneuvers on rugs and over door sillls with minimal assistance (Pt.>75%) FIM - Locomotion: Ambulation Locomotion: Ambulation Assistive Devices: Engineer, agricultural Ambulation/Gait Assistance: 2: Max assist Locomotion: Ambulation: 1: Travels less than 50 ft with maximal assistance (Pt: 25 - 49%)  Comprehension Comprehension Mode: Auditory Comprehension: 6-Follows complex conversation/direction: With extra time/assistive device  Expression Expression Mode: Verbal Expression: 5-Expresses complex 90%  of the time/cues < 10% of the time  Social Interaction Social Interaction: 6-Interacts appropriately with others with medication or extra time (anti-anxiety, antidepressant).  Problem Solving Problem Solving Mode: Not assessed Problem Solving: 6-Solves complex problems: With extra time  Memory Memory Mode: Not assessed Memory: 6-More than reasonable amt of time   Medical Problem List and Plan:  1. DVT Prophylaxis/Anticoagulation: Pharmaceutical: Lovenox  2. Pain Management: tramadol, tylenol. May utilize heat or ice for rib cage discomfort  -neurontin added for neurogenic pain due to CVA   3. Mood: egosupport by team. He is fairly motivated and seems to be in good spirits  4. Neuropsych: This patient is capable of making decisions on his/her own behalf.  5. HTN: Monitor with bid checks. Avoid hypotension for now. Continue atenolol, HCTZ and lisinopril.  6. Constipation (acute on chronic):this may be contribute to nausea. scheduled softener/laxative, suppository or enema if needed  7. Thrush: nystatin mouth wash  8. Hyperlipidemia: zocor 10mg  daily 9. 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 His swallowing improved postoperatively and has not noticeably worsened post stroke.Continues to have a cough which may be either a low-grade aspiration or side effect of his ACE inhibitor 10. Urinary hesitancy DC diphenhydramine and trazodone,monitor, avoid  Anticholinergic agents  LOS (Days) 5 A FACE TO FACE EVALUATION WAS PERFORMED  KIRSTEINS,ANDREW E 08/23/2012, 9:16 AM

## 2012-08-23 NOTE — Patient Care Conference (Signed)
Inpatient RehabilitationTeam Conference Note Date: 08/23/2012   Time: 11:30 AM    Patient Name: [name]      Medical Record Number: [id number]  Date of Birth: [birth year] Sex: Male         Room/Bed: 4145/4145-01 Payor Info: Payor: MEDICARE  Plan: MEDICARE PART A AND B  Product Type: *No Product type*     Admitting Diagnosis: RT CVA  Admit Date/Time:  08/18/2012  4:25 PM Admission Comments: No comment available   Primary Diagnosis:  CVA (cerebral infarction) Principal Problem: CVA (cerebral infarction)  Patient Active Problem List   Diagnosis Date Noted  . CVA (cerebral infarction) 08/14/2012  . Dysphagia following cerebrovascular accident 08/14/2012  . Left hemiplegia 08/14/2012  . Constipation   . Chronic back pain   . Peripheral neuropathy   . Dysphagia 07/13/2012  . TIA (transient ischemic attack) 04/08/2012  . Stroke 02/21/2012  . Preventative health care 04/15/2011  . [place]a, RIGHT 05/29/2010  . DIABETES MELLITUS, TYPE II, UNCONTROLLED 04/13/2010  . ALLERGIC RHINITIS 04/13/2010  . GERD 04/13/2010  . SHOULDER PAIN, RIGHT 04/13/2010  . Headache 04/13/2010  . TOBACCO USE, QUIT 04/13/2010  . ERECTILE DYSFUNCTION 03/12/2009  . HYPERTENSION, BENIGN 03/12/2009  . CAD, NATIVE VESSEL 03/12/2009  . HYPERLIPIDEMIA-MIXED 03/07/2009    Expected Discharge Date: Expected Discharge Date: 09/07/12  Team Members Present: Physician: Dr. Alysia Penna Social Worker Present: Mike Kin, LCSW Nurse Present: Other (comment) Edmonia Lynch HIcks-RN) PT Present: Raylene Walker, PT;Other (comment) Chrys Racer Cook-PT) OT Present: Sim Boast, OT SLP Present: Gunnar Fusi, SLP     Current Status/Progress Goal Weekly Team Focus  Medical   Nausea, decreased activity tolerance  Adjust medications and diet  Improve endurance   Bowel/Bladder   Continent of bowel and bladder. HX of constipation  Have BM atleast Q2 days with medication if needed  medications for bowel as  ordered and PRN   Swallow/Nutrition/ Hydration   Dys.3 thin liquids with intermittent supervsion for use of safe swallow compensatory strategies  least restrictive p.o. Mod I   carrryover    ADL's   Overall Mod-Min Assist with BADL tasks  Overall Supervision with BADL tasks  Activity tolerance, dynamic sitting and standing balance, forced use of LUE & LLE during BADL tasks and functional mobility to include slow, deliberate and controlled movements, gaze stabilization exercises,   Mobility             Communication   min assist due to decreased vocal intensity  supervision   carryover of compensatory strategies   Safety/Cognition/ Behavioral Observations  mod I   goals met      Pain   ultram 50mg  q6 hrs PRN, Robaxin 500mg  q6hrs PRN-pain in left neck and side  3 or less  assess and medicate as needed. Give pain medication prior to therapy.   Skin   skin clean dry and intact            *See Interdisciplinary Assessment and Plan and progress notes for long and short-term goals  Barriers to Discharge: Activity tolerance    Possible Resolutions to Barriers:  Continued program    Discharge Planning/Teaching Needs:  HOme with wife and other family members to assist with his care      Team Discussion: Pain in neck MD treating with meds.  Nausea better and able to participate in therapies.  Making progress.  Revisions to Treatment Plan:  None   Continued Need for Acute Rehabilitation Level of Care: The patient requires daily medical  management by a physician with specialized training in physical medicine and rehabilitation for the following conditions: Daily direction of a multidisciplinary physical rehabilitation program to ensure safe treatment while eliciting the highest outcome that is of practical value to the patient.: Yes Daily medical management of patient stability for increased activity during participation in an intensive rehabilitation regime.: Yes Daily analysis of  laboratory values and/or radiology reports with any subsequent need for medication adjustment of medical intervention for : Neurological problems  [name], Gardiner Rhyme 08/25/2012, 1:36 PM

## 2012-08-23 NOTE — Progress Notes (Signed)
Occupational Therapy Session Notes  Patient Details  Name: [name] MRN: [id number] Date of Birth: 1951/05/02  Today's Date: 08/23/2012 Time: 0800-0900 and 1300-1345 Time Calculation (min): 60 min and 45 minutes  Short Term Goals: Week 1:  OT Short Term Goal 1 (Week 1): Pt will perform UB bathing with min assist for washing the right arm using the LUE. OT Short Term Goal 2 (Week 1): Pt will perfrom UB dressing with supervision unsupported sitting. OT Short Term Goal 3 (Week 1): Pt will perform LB dressing with min assist sit to stand to donn pants only. OT Short Term Goal 4 (Week 1): Pt will use the LUE as an active assist for selfcare and grooming tasks with no more than min instructional cueing and mod facilitation.  Skilled Therapeutic Interventions/Progress Updates:  1)  Self care retraining to include shower, dress, & groom.  Focus session on slow & controlled movements, forced use of LUE with all BADL tasks and mobility, weight shifts onto LLE with sit><stand, dynamic standing in shower, with LB dressing and grooming at sink.    2)  Bed mobility with focus on technique to decrease hypertonicity during effort, weight bearing through left side/elbow, squat pivot bed>w/c, patient propelled with RUE & RLE to therapy kitchen to perform LUE gross and fine motor functional exercises.  Patient was surprised by just how hard the simple task of sorting silverware could be.  Therapy Documentation Precautions:  Precautions Precautions: Fall Precaution Comments: left UE and LE hemiparesis Restrictions Weight Bearing Restrictions: No Pain: Denies pain during first session, reports spasms in left lower back and LLE with movement, RN aware and medication provided.  See FIM for current functional status  Therapy/Group: Individual Therapy  [name] 08/23/2012, 9:26 AM

## 2012-08-23 NOTE — Progress Notes (Signed)
Physical Therapy Session Note  Patient Details  Name: [name] MRN: [id number] Date of Birth: 06-30-1951  Today's Date: 08/23/2012 Time: [id number] Time Calculation (min): 56 min  Short Term Goals: Week 1:  PT Short Term Goal 1 (Week 1): Pt will perform sit <> stand with min assist.  PT Short Term Goal 2 (Week 1): Pt will scoot pivot transfer bed <> chair with min assist  PT Short Term Goal 3 (Week 1): Pt will ambulate x 15' with max assist with LRAD.  Skilled Therapeutic Interventions/Progress Updates:    See below for details.  Pt reports no issues with dizziness while ambulating.  Therapy Documentation Precautions:  Precautions Precautions: Fall Precaution Comments: left UE and LE hemiparesis Restrictions Weight Bearing Restrictions: No Pain:  No pain Mobility:  Sit to stand with min@, set up with PFRW with min@.  W/c to bed with mod@.   Locomotion :  Gait training with [place] with mod@ to assist and facilitate placement of LLE, tending to scissor and with external hip rotation, 40'.  Gait training with bilateral HHA with total@+2, pt =60%, needing facilitation of LLE through gait cycle for swing and stance phase due to LE weakness particular at the hip and ankle, 40 x2. W/c propulsion on unit with R extremities x 100' with supervision.  Steps with 2 rails with total @+2 pt=60%, needing assistance with placement of LLE on steps.    Other Treatments:   Neuro-reed:  Pt performed step ups with LLE to address hip flexion strengthening with facilitation of muscle control to place foot correctly on step.  Standing on LLE facilitating correct LLE alignment and muscle activation while stepping up with RLE.  Mini squats addressing eliminating hip adduction while performing LLE extension in weight bearing for strengthening. See FIM for current functional status  Therapy/Group: Individual Therapy  Waylan Boga 08/23/2012, 11:45 AM

## 2012-08-23 NOTE — Progress Notes (Signed)
Speech Language Pathology Daily Session Note  Patient Details  Name: [name] MRN: [id number] Date of Birth: October 25, 1951  Today's Date: 08/23/2012 Time: 1430-1500 Time Calculation (min): 30 min  Short Term Goals: Week 1: SLP Short Term Goal 1 (Week 1): Pt. will implement speech strategies in conversation in a moderately noisy environment with min verbal cues.  SLP Short Term Goal 1 - Progress (Week 1): Progressing toward goal SLP Short Term Goal 2 (Week 1): Pt. will safely consume a Dys 3 diet and thin liquids with min verbal cues for swallow precautions.  SLP Short Term Goal 2 - Progress (Week 1): Progressing toward goal SLP Short Term Goal 3 (Week 1): Pt. will demonstrate executive functions during complex activities involving planning/organizing/self monitoring and correcting with min subtle cues.   SLP Short Term Goal 3 - Progress (Week 1): Met  Skilled Therapeutic Interventions: Treatment session focused on addressing speech intelligibility goals. SLP educated patient regarding carryover of speech intelligibility strategies such as slow rate and over articulation with min assist faded to supervision level verbal cues.  During discussion patient expressed concerns regarding globus sensation on left side of throat especially when eating and when belching.  SLP suspects it may result from mild esophageal dysphagia per last MBSS.  Given good carryover of safe swallow precautions SLP recommends a decrease to frequency of three times a week due to progress.     FIM:  Comprehension Comprehension Mode: Auditory Comprehension: 6-Follows complex conversation/direction: With extra time/assistive device Expression Expression Mode: Verbal Expression: 5-Expresses complex 90% of the time/cues < 10% of the time Social Interaction Social Interaction: 6-Interacts appropriately with others with medication or extra time (anti-anxiety, antidepressant). Problem Solving Problem Solving: 6-Solves  complex problems: With extra time Memory Memory: 6-More than reasonable amt of time  Pain Pain Assessment Pain Assessment: No/denies pain  Therapy/Group: Individual Therapy  Carmelia Roller., [place] [id number]  [place] 08/23/2012, 5:31 PM

## 2012-08-24 ENCOUNTER — Inpatient Hospital Stay (HOSPITAL_COMMUNITY): Payer: Medicare Other | Admitting: *Deleted

## 2012-08-24 ENCOUNTER — Inpatient Hospital Stay (HOSPITAL_COMMUNITY): Payer: Medicare Other | Admitting: Occupational Therapy

## 2012-08-24 DIAGNOSIS — I69991 Dysphagia following unspecified cerebrovascular disease: Secondary | ICD-10-CM

## 2012-08-24 DIAGNOSIS — G8110 Spastic hemiplegia affecting unspecified side: Secondary | ICD-10-CM

## 2012-08-24 DIAGNOSIS — I6330 Cerebral infarction due to thrombosis of unspecified cerebral artery: Secondary | ICD-10-CM

## 2012-08-24 DIAGNOSIS — Z5189 Encounter for other specified aftercare: Secondary | ICD-10-CM

## 2012-08-24 LAB — GLUCOSE, CAPILLARY
Glucose-Capillary: 151 mg/dL — ABNORMAL HIGH (ref 70–99)
Glucose-Capillary: 226 mg/dL — ABNORMAL HIGH (ref 70–99)
Glucose-Capillary: 111 mg/dL — ABNORMAL HIGH (ref 70–99)
Glucose-Capillary: 210 mg/dL — ABNORMAL HIGH (ref 70–99)

## 2012-08-24 MED ORDER — INSULIN GLARGINE 100 UNIT/ML ~~LOC~~ SOLN
15.0000 [IU] | Freq: Every day | SUBCUTANEOUS | Status: DC
  Administered 2012-08-25 – 2012-09-07 (×14): 15 [IU] via SUBCUTANEOUS

## 2012-08-24 MED ORDER — LORATADINE 10 MG PO TABS
10.0000 mg | ORAL_TABLET | Freq: Every day | ORAL | Status: DC
  Administered 2012-08-24 – 2012-09-07 (×15): 10 mg via ORAL
  Filled 2012-08-24 (×17): qty 1

## 2012-08-24 MED ORDER — DICLOFENAC SODIUM 1 % TD GEL
2.0000 g | Freq: Four times a day (QID) | TRANSDERMAL | Status: DC
  Administered 2012-08-24 – 2012-09-02 (×6): 2 g via TOPICAL
  Filled 2012-08-24 (×2): qty 100

## 2012-08-24 MED ORDER — PREGABALIN 25 MG PO CAPS
25.0000 mg | ORAL_CAPSULE | Freq: Two times a day (BID) | ORAL | Status: DC
  Administered 2012-08-24 – 2012-08-27 (×8): 25 mg via ORAL
  Filled 2012-08-24 (×8): qty 1

## 2012-08-24 NOTE — Progress Notes (Signed)
Physical Therapy Weekly Progress Note  Patient Details  Name: [name] MRN: [id number] Date of Birth: [birth year]  Today's Date: 08/24/2012 Time: 0930-1030 Time Calculation (min): 60 min  Patient has met 3 of 3 short term goals. He was not tolerating therapy well at beginning of week d/t nausea, dizziness, c/o overall weakness but has significantly improved. Vestibular eval was completed, pt with gaze stabilization issues but these are now being addressed and pt I performing VOR exercise in the pm.  Patient continues to demonstrate the following deficits:ataxia L, decreased motor control, impaired sensation, impaired postural control and awareness, decreased balance, decreased activity tolerance, visual/vestibular impairment  and therefore will continue to benefit from skilled PT intervention to enhance overall performance with activity tolerance, balance, postural control, ability to compensate for deficits, functional use of  left upper extremity and left lower extremity and coordination.  Patient some upgraded.  Continue plan of care.  PT Short Term Goals Week 2:  PT Short Term Goal 1 (Week 2): Pt will perform sit to stand x 5 with symmetry S PT Short Term Goal 2 (Week 2): Pt will gait 50 feet controlled environement close S PT Short Term Goal 3 (Week 2): Pt will gait household environment 25 ft min A  Skilled Therapeutic Interventions/Progress Updates:    NMR treatment- for LLE and LUE to increase strength and control for mobility; pt LUE improved so much that he was able to move from [place] to regular RW without even grip orthosis; LLE ataxic swing, impaired weight shift  Therapy Documentation Precautions:  Precautions Precautions: Fall Precaution Comments: vestibular Restrictions Weight Bearing Restrictions: No Pain: L knee in SLS- discussed with PA pt to bring in biofreeze from home Mobility: Transfers Sit to Stand: Without upper extremity assist Sit to Stand Details:  Tactile cues for weight beaing;Tactile cues for posture Sit to Stand Details (indicate cue type and reason): L femur IR and adducts without facilitation Stand to Sit: 5: Supervision Squat pivot close S level transfers Locomotion :   mod A gait controlled environment up to 44 ft Trunk/Postural Assessment :   overuses R shoulder, tends to lean forward, decreased weight on L side    See FIM for current functional status  Therapy/Group: Individual Therapy  Othelia Pulling 08/24/2012, 10:24 AM

## 2012-08-24 NOTE — Progress Notes (Addendum)
Patient ID: [name], male   DOB: 07-10-1951, 61 y.o.   MRN: [id number] Subjective/Complaints: [name] is a 61 y.o. RH-male with a history of TIA, diabetes mellitus, hyperlipidemia, recent laryngoplasty 9/16 who presented with acute recurrent weakness and numbness involving his left side with facial weakness on 08/14/12 had a TIA with similar presentation in May 2013. Workup was unremarkable and patient placed on plavix for CVA prophylaxis. CT head negative. 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 2D echo done revealing EF 50-55% with grade 1 diastolic dysfunction. Carotid dopplers without ICA stenosis. Patient had worsening of symptoms past admission. 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 FEES done (patient has had problems with aspiration) and placed on D2, thin liquids with hard swallow. He is developing left sided spasms. 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  Neurontin wears off and tingling pain returns Review of Systems  Constitutional: Positive for malaise/fatigue.  HENT:       Vocal hoarseness  Respiratory: Positive for cough. Negative for sputum production, shortness of breath and wheezing.   Gastrointestinal: Positive for nausea. Negative for vomiting.  All other systems reviewed and are negative.   Objective: Vital Signs: Blood pressure 143/94, pulse 100, temperature 98.4 F (36.9 C), temperature source Oral, resp. rate 20, height 5\' 11"  (1.803 m), weight 91 kg (200 lb 9.9 oz), SpO2 99.00%. No results found. Results for orders placed during the hospital encounter of 08/18/12 (from the past 72 hour(s))  GLUCOSE, CAPILLARY     Status: Abnormal   Collection Time   08/21/12  9:30 AM      Component Value Range Comment   Glucose-Capillary 171 (*) 70 - 99 mg/dL    Comment 1 Notify RN     GLUCOSE, CAPILLARY      Status: Abnormal   Collection Time   08/21/12 11:22 AM      Component Value Range Comment   Glucose-Capillary 131 (*) 70 - 99 mg/dL   GLUCOSE, CAPILLARY     Status: Abnormal   Collection Time   08/21/12  2:43 PM      Component Value Range Comment   Glucose-Capillary 179 (*) 70 - 99 mg/dL    Comment 1 Notify RN     GLUCOSE, CAPILLARY     Status: Abnormal   Collection Time   08/21/12  4:33 PM      Component Value Range Comment   Glucose-Capillary 184 (*) 70 - 99 mg/dL   CBC WITH DIFFERENTIAL     Status: Abnormal   Collection Time   08/21/12  6:35 PM      Component Value Range Comment   WBC 6.2  4.0 - 10.5 K/uL    RBC 4.24  4.22 - 5.81 MIL/uL    Hemoglobin 12.9 (*) 13.0 - 17.0 g/dL    HCT 38.4 (*) 39.0 - 52.0 %    MCV 90.6  78.0 - 100.0 fL    MCH 30.4  26.0 - 34.0 pg    MCHC 33.6  30.0 - 36.0 g/dL    RDW 12.5  11.5 - 15.5 %    Platelets 216  150 - 400 K/uL    Neutrophils Relative 50  43 - 77 %    Neutro Abs 3.1  1.7 - 7.7 K/uL    Lymphocytes Relative 36  12 - 46 %    Lymphs Abs 2.3  0.7 -  4.0 K/uL    Monocytes Relative 10  3 - 12 %    Monocytes Absolute 0.6  0.1 - 1.0 K/uL    Eosinophils Relative 3  0 - 5 %    Eosinophils Absolute 0.2  0.0 - 0.7 K/uL    Basophils Relative 0  0 - 1 %    Basophils Absolute 0.0  0.0 - 0.1 K/uL   GLUCOSE, CAPILLARY     Status: Abnormal   Collection Time   08/21/12  7:51 PM      Component Value Range Comment   Glucose-Capillary 127 (*) 70 - 99 mg/dL   GLUCOSE, CAPILLARY     Status: Abnormal   Collection Time   08/22/12  7:38 AM      Component Value Range Comment   Glucose-Capillary 138 (*) 70 - 99 mg/dL    Comment 1 STAT Lab      Comment 2 Notify RN     GLUCOSE, CAPILLARY     Status: Abnormal   Collection Time   08/22/12 11:41 AM      Component Value Range Comment   Glucose-Capillary 204 (*) 70 - 99 mg/dL    Comment 1 Notify RN     GLUCOSE, CAPILLARY     Status: Abnormal   Collection Time   08/22/12 11:50 AM      Component Value Range  Comment   Glucose-Capillary 181 (*) 70 - 99 mg/dL    Comment 1 Notify RN     GLUCOSE, CAPILLARY     Status: Abnormal   Collection Time   08/22/12  4:26 PM      Component Value Range Comment   Glucose-Capillary 110 (*) 70 - 99 mg/dL   GLUCOSE, CAPILLARY     Status: Abnormal   Collection Time   08/22/12  9:05 PM      Component Value Range Comment   Glucose-Capillary 129 (*) 70 - 99 mg/dL   BASIC METABOLIC PANEL     Status: Abnormal   Collection Time   08/23/12  6:20 AM      Component Value Range Comment   Sodium 137  135 - 145 mEq/L    Potassium 3.3 (*) 3.5 - 5.1 mEq/L    Chloride 97  96 - 112 mEq/L    CO2 33 (*) 19 - 32 mEq/L    Glucose, Bld 140 (*) 70 - 99 mg/dL    BUN 14  6 - 23 mg/dL    Creatinine, Ser 1.27  0.50 - 1.35 mg/dL    Calcium 9.6  8.4 - 10.5 mg/dL    GFR calc non Af Amer 59 (*) >90 mL/min    GFR calc Af Amer 68 (*) >90 mL/min   GLUCOSE, CAPILLARY     Status: Abnormal   Collection Time   08/23/12  7:28 AM      Component Value Range Comment   Glucose-Capillary 138 (*) 70 - 99 mg/dL    Comment 1 Notify RN     GLUCOSE, CAPILLARY     Status: Abnormal   Collection Time   08/23/12 11:50 AM      Component Value Range Comment   Glucose-Capillary 162 (*) 70 - 99 mg/dL    Comment 1 Notify RN     GLUCOSE, CAPILLARY     Status: Abnormal   Collection Time   08/23/12  4:45 PM      Component Value Range Comment   Glucose-Capillary 136 (*) 70 - 99 mg/dL   GLUCOSE, CAPILLARY  Status: Abnormal   Collection Time   08/23/12  9:07 PM      Component Value Range Comment   Glucose-Capillary 123 (*) 70 - 99 mg/dL      HEENT: horse voice Cardio: RRR Resp: CTA B/L GI: BS positive Extremity:  No Edema Skin:   Intact Neuro: Alert/Oriented, Anxious, Abnormal Sensory reduced on left, Abnormal Motor 2 minus left deltoid bicep tricep grip as well as left hip flexor knee extensor 1 ankle dorsiflexor plantar flexor and Abnormal FMC Ataxic/ dec FMC and Tone  increased tone left abductors  and extensors in the lower extremity Musc/Skel:  Other pain with resisted hip abduction. No tenderness over the lateral hip. No tenderness over the knee. Tenderness over the  medial hip adductors peripheral pulses normal, normal skin temperature  in the feet   Assessment/Plan: 1. 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 Physiatrist is providing close team supervision and 24 hour management of active medical problems listed below. Physiatrist and rehab team continue to assess barriers to discharge/monitor patient progress toward functional and medical goals. FIM: FIM - Bathing Bathing Steps Patient Completed: Chest;Left Arm;Abdomen;Front perineal area;Buttocks;Right upper leg;Left upper leg;Right lower leg (including foot);Right Arm Bathing: 4: Min-Patient completes 8-9 96f 10 parts or 75+ percent  FIM - Upper Body Dressing/Undressing Upper body dressing/undressing steps patient completed: Thread/unthread right sleeve of pullover shirt/dresss;Put head through opening of pull over shirt/dress;Pull shirt over trunk;Thread/unthread left sleeve of pullover shirt/dress Upper body dressing/undressing: 5: Set-up assist to: Obtain clothing/put away FIM - Lower Body Dressing/Undressing Lower body dressing/undressing steps patient completed: Thread/unthread right pants leg;Thread/unthread right underwear leg;Thread/unthread left underwear leg;Thread/unthread left pants leg;Don/Doff right shoe;Pull underwear up/down;Pull pants up/down;Don/Doff right sock;Don/Doff left shoe Lower body dressing/undressing: 4: Min-Patient completed 75 plus % of tasks  FIM - Toileting Toileting steps completed by patient: Adjust clothing prior to toileting;Performs perineal hygiene Toileting Assistive  Devices: Grab bar or rail for support (stood to void) Toileting: 0: Activity did not occur  FIM - Radio producer Devices: Elevated toilet seat;Grab bars Toilet Transfers: 3-To toilet/BSC: Mod A (lift or lower assist);3-From toilet/BSC: Mod A (lift or lower assist)  FIM - Control and instrumentation engineer Devices: Walker;Bed rails;Arm rests Bed/Chair Transfer: 3: Sit > Supine: Mod A (lifting assist/Pt. 50-74%/lift 2 legs);3: Chair or W/C > Bed: Mod A (lift or lower assist)  FIM - Locomotion: Wheelchair Distance: 100 Locomotion: Wheelchair: 2: Travels 50 - 149 ft with supervision, cueing or coaxing FIM - Locomotion: Ambulation Locomotion: Ambulation Assistive Devices: Engineer, agricultural;Other (comment) (bilateral HHA) Ambulation/Gait Assistance: 1: +2 Total assist Locomotion: Ambulation: 1: Two helpers  Comprehension Comprehension Mode: Auditory Comprehension: 6-Follows complex conversation/direction: With extra time/assistive device  Expression Expression Mode: Verbal Expression: 5-Expresses complex 90% of the time/cues < 10% of the time  Social Interaction Social Interaction: 6-Interacts appropriately with others with medication or extra time (anti-anxiety, antidepressant).  Problem Solving Problem Solving Mode: Not assessed Problem Solving: 6-Solves complex problems: With extra time  Memory Memory Mode: Not assessed Memory: 6-More than reasonable amt of time   Medical Problem List and Plan:  1. DVT Prophylaxis/Anticoagulation: Pharmaceutical: Lovenox  2. Pain Management: tramadol, tylenol. 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 Mood: egosupport by team. He is fairly motivated and seems to be in good spirits  4. Neuropsych: This patient is capable of making decisions on his/her own behalf.  5. HTN: Monitor with bid  checks. Avoid hypotension for now. Continue atenolol, HCTZ and off  lisinopril.  6. Constipation (acute on chronic):this may be contribute to nausea. scheduled softener/laxative, suppository or enema if needed  7. Thrush: nystatin mouth wash  8. Hyperlipidemia: zocor 10mg  daily 9. 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 His swallowing improved postoperatively and has not noticeably worsened post stroke.Continues to have a cough which may be either a low-grade aspiration or side effect of his ACE inhibitor 10. Urinary hesitancy DC diphenhydramine and trazodone,monitor, avoid  Anticholinergic agents 11.  DM good control, pt concerned lantus may increase his CBG, we discussed this LOS (Days) 6 A FACE TO FACE EVALUATION WAS PERFORMED  [name] E 08/24/2012, 8:08 AM

## 2012-08-24 NOTE — Progress Notes (Signed)
Physical Therapy Session Note  Patient Details  Name: Kayhan Souffront MRN: [id number] Date of Birth: 1951-06-18  Today's Date: 08/24/2012 Time: 1301-1400 Time Calculation (min): 59 min  Short Term Goals: Week 2:  PT Short Term Goal 1 (Week 2): Pt will perform sit to stand x 5 with symmetry S PT Short Term Goal 2 (Week 2): Pt will gait 50 feet controlled environement close S PT Short Term Goal 3 (Week 2): Pt will gait household environment 25 ft min A  Skilled Therapeutic Interventions/Progress Updates:    there-ex to increase strength LLE and for closed and open chain coordination to aid mobility  Progressed to NMR- standing with theraband around LLE to resist swing and then follow with anterior hip translation for gait quality, rolling R with cues for normal movement pattern, mod A supine to sit working on depression of pelvis to sit up  No c/o pain  Therapy Documentation Precautions:  Precautions Precautions: Fall Precaution Comments: vestibular Restrictions Weight Bearing Restrictions: No See FIM for current functional status  Therapy/Group: Individual Therapy  Othelia Pulling 08/24/2012, 2:03 PM

## 2012-08-24 NOTE — Progress Notes (Signed)
Occupational Therapy Session Note  Patient Details  Name: [name] MRN: [id number] Date of Birth: 1951/01/16  Today's Date: 08/24/2012 Time: 0805-0900 Time Calculation (min): 55 min  Short Term Goals: Week 1:  OT Short Term Goal 1 (Week 1): Pt will perform UB bathing with min assist for washing the right arm using the LUE. OT Short Term Goal 2 (Week 1): Pt will perfrom UB dressing with supervision unsupported sitting. OT Short Term Goal 3 (Week 1): Pt will perform LB dressing with min assist sit to stand to donn pants only. OT Short Term Goal 4 (Week 1): Pt will use the LUE as an active assist for selfcare and grooming tasks with no more than min instructional cueing and mod facilitation.      Skilled Therapeutic Interventions/Progress Updates:   Pt seen for BADL retraining of toileting, bathing, and dressing with a focus on functional transfers, use of LUE, standing balance.  At start of session, pt worked on LUE AROM with placing and holding exercises in supine to increase motor control with shoulder flexion to 90 and elbow extension.   Pt stood to toilet and then transferred into shower.  Min assist with all squat pivot transfers and sit to stand.  Pt demonstrated improved standing balance for LB bathing in shower and with clothing management.  He was able to use washcloth in left hand to wash arm.  Improved active rom and control today.     Therapy Documentation Precautions:  Precautions Precautions: Fall Precaution Comments: vestibular Restrictions Weight Bearing Restrictions: No   Pain: No c/o  ADL:  See FIM for current functional status  Therapy/Group: Individual Therapy  SAGUIER,JULIA 08/24/2012, 11:42 AM

## 2012-08-24 NOTE — Progress Notes (Signed)
Occupational Therapy Session Note  Patient Details  Name: [name] MRN: [id number] Date of Birth: Oct 14, 1951  Today's Date: 08/24/2012 Time: 0900-0930 Time Calculation (min): 30 min  Short Term Goals: Week 1:  OT Short Term Goal 1 (Week 1): Pt will perform UB bathing with min assist for washing the right arm using the LUE. OT Short Term Goal 2 (Week 1): Pt will perfrom UB dressing with supervision unsupported sitting. OT Short Term Goal 3 (Week 1): Pt will perform LB dressing with min assist sit to stand to donn pants only. OT Short Term Goal 4 (Week 1): Pt will use the LUE as an active assist for selfcare and grooming tasks with no more than min instructional cueing and mod facilitation.  Skilled Therapeutic Interventions/Progress Updates:  Self care retraining to include grooming tasks while standing and sitting at the sink.  Patient focused on functional use of LUE using slow and controlled movements while watching his hand at work with unilateral and bilateral tasks, sit><stand with focus onslow and controlled movements with weight evenly distributed over BLEs and using BUEs to assist, dynamic standing balance and learning to self correct when LLE, LUE, left hip, and trunk collapse to include not over compensating which can throw him off balance.  Therapy Documentation Precautions:  Precautions Precautions: Fall Precaution Comments: left UE and LE hemiparesis Restrictions Weight Bearing Restrictions: No Pain: Denies pain See FIM for current functional status  Therapy/Group: Individual Therapy  Lucero Auzenne 08/24/2012, 10:08 AM

## 2012-08-25 ENCOUNTER — Inpatient Hospital Stay (HOSPITAL_COMMUNITY): Payer: Medicare Other | Admitting: Speech Pathology

## 2012-08-25 ENCOUNTER — Inpatient Hospital Stay (HOSPITAL_COMMUNITY): Payer: Medicare Other | Admitting: Occupational Therapy

## 2012-08-25 ENCOUNTER — Inpatient Hospital Stay (HOSPITAL_COMMUNITY): Payer: Medicare Other | Admitting: *Deleted

## 2012-08-25 LAB — GLUCOSE, CAPILLARY
Glucose-Capillary: 127 mg/dL — ABNORMAL HIGH (ref 70–99)
Glucose-Capillary: 191 mg/dL — ABNORMAL HIGH (ref 70–99)
Glucose-Capillary: 98 mg/dL (ref 70–99)
Glucose-Capillary: 136 mg/dL — ABNORMAL HIGH (ref 70–99)

## 2012-08-25 MED ORDER — MUSCLE RUB 10-15 % EX CREA
TOPICAL_CREAM | CUTANEOUS | Status: DC | PRN
  Administered 2012-08-30 – 2012-09-04 (×5): via TOPICAL
  Administered 2012-09-05: 1 via TOPICAL
  Filled 2012-08-25 (×2): qty 85

## 2012-08-25 NOTE — Progress Notes (Signed)
Orthopedic Tech Progress Note Patient Details:  [name] October 22, 1951 [id number] Knee sleeve applied to Left knee with instructions.  Ortho Devices Type of Ortho Device: Knee Sleeve Ortho Device/Splint Location: Left Ortho Device/Splint Interventions: Application   Asia R Thompson 08/25/2012, 10:21 AM

## 2012-08-25 NOTE — Progress Notes (Signed)
Physical Therapy Session Note  Patient Details  Name: [name] MRN: [id number] Date of Birth: 1951/01/05  Today's Date: 08/25/2012 Time: [id number] Time Calculation (min): 54 min  Short Term Goals: Week 2:  PT Short Term Goal 1 (Week 2): Pt will perform sit to stand x 5 with symmetry S PT Short Term Goal 2 (Week 2): Pt will gait 50 feet controlled environement close S PT Short Term Goal 3 (Week 2): Pt will gait household environment 25 ft min A  Skilled Therapeutic Interventions/Progress Updates:    wife present and observed; discussed recommendation for initial 24 hr S/A and that pt would need ongoing therapy to maximize recovery, recovery slower with sensory deficits and L knee pain also limiting his some and encouraged her to bring in [place]- she reports thru multiple family members they would provide 24 hr care  NMR- quadriped and tall kneeling activity for forced use L side with cues and facilitation, goals to increase core control and postural adjustments; progressed to floor transfer education, two trials mod A with significant A to position LLE to prevent injury  Standing weight shifting and task to increase use LUE and facilitate L knee extension and weight bearing playing tic tac toe, wiping mirror, working on pt pushing up with L hand to stand  Pt did not c/o knee pain specifically but focusses on L knee and rubs it frequently, wearing knee sleeve but he states he does not like it  Therapy Documentation Precautions:  Precautions Precautions: Fall Precaution Comments: vestibular Restrictions Weight Bearing Restrictions: No See FIM for current functional status  Therapy/Group: Individual Therapy  Othelia Pulling 08/25/2012, 3:26 PM

## 2012-08-25 NOTE — Progress Notes (Signed)
Physical Therapy Session Note  Patient Details  Name: [name] MRN: [id number] Date of Birth: [birth year]  Today's Date: 08/25/2012 Time: 1100-1202 Time Calculation (min): 62 min  Short Term Goals: Week 2:  PT Short Term Goal 1 (Week 2): Pt will perform sit to stand x 5 with symmetry S PT Short Term Goal 2 (Week 2): Pt will gait 50 feet controlled environement close S PT Short Term Goal 3 (Week 2): Pt will gait household environment 25 ft min A  Skilled Therapeutic Interventions/Progress Updates:    NMR- began on Biodex- unable to do LOS and max difficulty even with postural stability- max A to get weight symmetrical d/t multiple abnormal compensations at trunk and impersistance at L knee- after 20+ minutes of this (focus on it close range) pt became nauseated but it resolved with rest break and water.  Standing weight shfits with roation using LUE in closed and open chain activity with mod A for balance and frequent cues to extend L knee.  Gait working on shorter step L d/t ataxia and forward weight shift in L stance, with and without 4 lb weight on LLE, alos began education for safe use of RW with household challenges, tight turns (very difficult d/t ataxia) sidestepping and backing up mod A  Eye exercises 4 ft without dizziness but with fatigue and at 2 ft distance unable to stabilize eyes with head turn either direction  Pt without c/o specific pain but wearing compression brace on L knee that he states was interfering from "feeling what I need to feel"  Therapy Documentation Precautions:  Precautions Precautions: Fall Precaution Comments: vestibular Restrictions Weight Bearing Restrictions: No See FIM for current functional status  Therapy/Group: Individual Therapy  Othelia Pulling 08/25/2012, 12:11 PM

## 2012-08-25 NOTE — Progress Notes (Signed)
Occupational Therapy Weekly Progress Note and Treatment Session  Patient Details  Name: [name] MRN: [id number] Date of Birth: 10-Jul-1951  Today's Date: 08/25/2012 Time: 0800-0900 Time Calculation (min): 60 min  Progress Updates: Patient has met 4 of 4 short term goals.  Initially, patient with c/o nausea and dizziness and unable to tolerate therapy well however, Vestibular Evaluation revealed gaze stabilization impairment which continues to be addressed and patient has not complained of nausea or vomiting.  Patient's progress has also been limited by poor activity tolerance and left knee and lower back pain which he states are premorbid. 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  Therefore, patient will continue to benefit from skilled OT intervention to enhance overall performance with BADL, iADL and Reduce care partner burden.  Patient progressing toward long term goals..  Continue plan of care.  OT Short Term Goals Week 1:  OT Short Term Goal 1 (Week 1): Pt will perform UB bathing with min assist for washing the right arm using the LUE. OT Short Term Goal 1 - Progress (Week 1): Met OT Short Term Goal 2 (Week 1): Pt will perfrom UB dressing with supervision unsupported sitting. OT Short Term Goal 2 - Progress (Week 1): Met OT Short Term Goal 3 (Week 1): Pt will perform LB dressing with min assist sit to stand to donn pants only. OT Short Term Goal 3 - Progress (Week 1): Met OT Short Term Goal 4 (Week 1): Pt will use the LUE  as an active assist for selfcare and grooming tasks with no more than min instructional cueing and mod facilitation. OT Short Term Goal 4 - Progress (Week 1): Met Week 2:  OT Short Term Goal 1 (Week 2): Patient will perform meal tray set up with Modified I using BUEs OT Short Term Goal 2 (Week 2): Patient will perform dynamic standing for LB bath and dressing with close supervision and no more than 3 cues for safe strategies. OT Short Term Goal 3 (Week 2): Patient will stand at commode to urinate with close supervision and use of grab bars for safety. OT Short Term Goal 4 (Week 2): Patient will stand at sink for shaving and brushing teeth with close supervision OT Short Term Goal 5 (Week 2): Patient will use LUE at a diminished level for all BADL & IADL tasks with supervision.  Skilled Therapeutic Interventions:  Self care retraining to include toileting, sponge bath, dressing and grooming.  Focus session on bed mobility, sit><stand, standing balance, forced use of LUE unilaterally and bilaterally.  Patient requires mod verbal and tactile cues to sustain left sided muscle activation with sit><stand and during standing, weight shifts onto LLE secondary patient quickly shifts weight onto RLE and over uses RLE.  In standing, patient's left side collapses and patient appears unaware until he is significantly off balance then when he tries to recover his balance he often overcompensates which results in further fall risk. 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  Therapy Documentation Precautions:  Precautions Precautions: Fall Precaution Comments: vestibular Restrictions Weight Bearing Restrictions: No Pain: C/o left knee pain with weight bearing  See FIM  for current functional status  Therapy/Group: Individual Therapy  Noreta Kue 08/25/2012, 10:57 AM

## 2012-08-25 NOTE — Progress Notes (Signed)
Patient ID: [name], male   DOB: 05-Mar-1951, 61 y.o.   MRN: [id number] Subjective/Complaints: [name] is a 61 y.o. RH-male with a history of TIA, diabetes mellitus, hyperlipidemia, recent laryngoplasty 9/16 who presented with acute recurrent weakness and numbness involving his left side with facial weakness on 08/14/12 had a TIA with similar presentation in May 2013. Workup was unremarkable and patient placed on plavix for CVA prophylaxis. CT head negative. 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 2D echo done revealing EF 50-55% with grade 1 diastolic dysfunction. Carotid dopplers without ICA stenosis. Patient had worsening of symptoms past admission. 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 FEES done (patient has had problems with aspiration) and placed on D2, thin liquids with hard swallow. He is developing left sided spasms. 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  L Knee pain Review of Systems  Constitutional: Positive for malaise/fatigue.  HENT:       Vocal hoarseness  Respiratory: Positive for cough. Negative for sputum production, shortness of breath and wheezing.   Gastrointestinal: Positive for nausea. Negative for vomiting.  All other systems reviewed and are negative.   Objective: Vital Signs: Blood pressure 159/98, pulse 88, temperature 97.5 F (36.4 C), temperature source Oral, resp. rate 16, height 5\' 11"  (1.803 m), weight 91 kg (200 lb 9.9 oz), SpO2 99.00%. No results found. Results for orders placed during the hospital encounter of 08/18/12 (from the past 72 hour(s))  GLUCOSE, CAPILLARY     Status: Abnormal   Collection Time   08/22/12 11:41 AM      Component Value Range Comment   Glucose-Capillary 204 (*) 70 - 99 mg/dL    Comment 1 Notify RN     GLUCOSE, CAPILLARY     Status: Abnormal   Collection  Time   08/22/12 11:50 AM      Component Value Range Comment   Glucose-Capillary 181 (*) 70 - 99 mg/dL    Comment 1 Notify RN     GLUCOSE, CAPILLARY     Status: Abnormal   Collection Time   08/22/12  4:26 PM      Component Value Range Comment   Glucose-Capillary 110 (*) 70 - 99 mg/dL   GLUCOSE, CAPILLARY     Status: Abnormal   Collection Time   08/22/12  9:05 PM      Component Value Range Comment   Glucose-Capillary 129 (*) 70 - 99 mg/dL   BASIC METABOLIC PANEL     Status: Abnormal   Collection Time   08/23/12  6:20 AM      Component Value Range Comment   Sodium 137  135 - 145 mEq/L    Potassium 3.3 (*) 3.5 - 5.1 mEq/L    Chloride 97  96 - 112 mEq/L    CO2 33 (*) 19 - 32 mEq/L    Glucose, Bld 140 (*) 70 - 99 mg/dL    BUN 14  6 - 23 mg/dL    Creatinine, Ser 1.27  0.50 - 1.35 mg/dL    Calcium 9.6  8.4 - 10.5 mg/dL    GFR calc non Af Amer 59 (*) >90 mL/min    GFR calc Af Amer 68 (*) >90 mL/min   GLUCOSE, CAPILLARY     Status: Abnormal   Collection Time   08/23/12  7:28 AM      Component Value Range Comment   Glucose-Capillary  138 (*) 70 - 99 mg/dL    Comment 1 Notify RN     GLUCOSE, CAPILLARY     Status: Abnormal   Collection Time   08/23/12 11:50 AM      Component Value Range Comment   Glucose-Capillary 162 (*) 70 - 99 mg/dL    Comment 1 Notify RN     GLUCOSE, CAPILLARY     Status: Abnormal   Collection Time   08/23/12  4:45 PM      Component Value Range Comment   Glucose-Capillary 136 (*) 70 - 99 mg/dL   GLUCOSE, CAPILLARY     Status: Abnormal   Collection Time   08/23/12  9:07 PM      Component Value Range Comment   Glucose-Capillary 123 (*) 70 - 99 mg/dL   GLUCOSE, CAPILLARY     Status: Abnormal   Collection Time   08/24/12  7:33 AM      Component Value Range Comment   Glucose-Capillary 151 (*) 70 - 99 mg/dL    Comment 1 Notify RN     GLUCOSE, CAPILLARY     Status: Abnormal   Collection Time   08/24/12 11:30 AM      Component Value Range Comment   Glucose-Capillary  226 (*) 70 - 99 mg/dL    Comment 1 Notify RN     GLUCOSE, CAPILLARY     Status: Abnormal   Collection Time   08/24/12  4:29 PM      Component Value Range Comment   Glucose-Capillary 111 (*) 70 - 99 mg/dL    Comment 1 Notify RN     GLUCOSE, CAPILLARY     Status: Abnormal   Collection Time   08/24/12  8:32 PM      Component Value Range Comment   Glucose-Capillary 210 (*) 70 - 99 mg/dL   GLUCOSE, CAPILLARY     Status: Abnormal   Collection Time   08/25/12  7:18 AM      Component Value Range Comment   Glucose-Capillary 127 (*) 70 - 99 mg/dL    Comment 1 Notify RN        HEENT: horse voice Cardio: RRR Resp: CTA B/L GI: BS positive Extremity:  No Edema Skin:   Intact Neuro: Alert/Oriented, Anxious, Abnormal Sensory reduced on left, Abnormal Motor 2 minus left deltoid bicep tricep grip as well as left hip flexor knee extensor 1 ankle dorsiflexor plantar flexor and Abnormal FMC Ataxic/ dec FMC and Tone  increased tone left abductors and extensors in the lower extremity Musc/Skel:  Other pain with resisted hip abduction. No tenderness over the lateral hip. No tenderness over the knee. Tenderness over the  medial hip adductors peripheral pulses normal, normal skin temperature  in the feet   Assessment/Plan: 1. 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 Physiatrist is providing close team supervision and 24 hour management of active medical problems listed below. Physiatrist and rehab team continue to assess barriers to discharge/monitor patient progress toward functional and medical goals. FIM: FIM - Bathing Bathing Steps Patient Completed: Chest;Right Arm;Left Arm;Abdomen;Front perineal area;Buttocks;Right lower leg (including foot);Left upper leg;Right upper leg;Left lower  leg (including foot) Bathing: 4: Steadying assist  FIM - Upper Body Dressing/Undressing Upper body dressing/undressing steps patient completed: Thread/unthread right sleeve of pullover shirt/dresss;Put head through opening of pull over  shirt/dress;Pull shirt over trunk;Thread/unthread left sleeve of pullover shirt/dress Upper body dressing/undressing: 5: Set-up assist to: Obtain clothing/put away FIM - Lower Body Dressing/Undressing Lower body dressing/undressing steps patient completed: Thread/unthread right underwear leg;Thread/unthread left underwear leg;Pull underwear up/down;Thread/unthread right pants leg;Thread/unthread left pants leg;Pull pants up/down;Don/Doff right sock;Don/Doff right shoe;Don/Doff left shoe Lower body dressing/undressing: 4: Min-Patient completed 75 plus % of tasks  FIM - Toileting Toileting steps completed by patient: Adjust clothing prior to toileting;Performs perineal hygiene Toileting Assistive Devices: Grab bar or rail for support Toileting: 4: Steadying assist  FIM - Radio producer Devices: Elevated toilet seat;Grab bars Toilet Transfers: 3-To toilet/BSC: Mod A (lift or lower assist);3-From toilet/BSC: Mod A (lift or lower assist)  FIM - Control and instrumentation engineer Devices: Walker;Bed rails;Arm rests Bed/Chair Transfer: 3: Supine > Sit: Mod A (lifting assist/Pt. 50-74%/lift 2 legs;3: Bed > Chair or W/C: Mod A (lift or lower assist)  FIM - Locomotion: Wheelchair Distance: 100 Locomotion: Wheelchair: 5: Travels 150 ft or more: maneuvers on rugs and over door sills with supervision, cueing or coaxing FIM - Locomotion: Ambulation Locomotion: Ambulation Assistive Devices: Administrator Ambulation/Gait Assistance: 1: +2 Total assist Locomotion: Ambulation: 1: Travels less than 50 ft with moderate assistance (Pt: 50 - 74%)  Comprehension Comprehension Mode: Auditory Comprehension: 6-Follows complex  conversation/direction: With extra time/assistive device  Expression Expression Mode: Verbal Expression: 6-Expresses complex ideas: With extra time/assistive device  Social Interaction Social Interaction: 6-Interacts appropriately with others with medication or extra time (anti-anxiety, antidepressant).  Problem Solving Problem Solving Mode: Not assessed Problem Solving: 6-Solves complex problems: With extra time  Memory Memory Mode: Not assessed Memory: 6-More than reasonable amt of time   Medical Problem List and Plan:  1. DVT Prophylaxis/Anticoagulation: Pharmaceutical: Lovenox  2. Pain Management: tramadol, tylenol. 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 Mood: egosupport by team. He is fairly motivated and seems to be in good spirits  4. Neuropsych: This patient is capable of making decisions on his/her own behalf.  5. HTN: Monitor with bid checks. Avoid hypotension for now. Continue atenolol, HCTZ and off  lisinopril.  6. Constipation (acute on chronic):this may be contribute to nausea. scheduled softener/laxative, suppository or enema if needed  7. Thrush: nystatin mouth wash  8. Hyperlipidemia: zocor 10mg  daily 9. 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 His swallowing improved postoperatively and has not noticeably worsened post stroke.Continues to have a cough which may be either a low-grade aspiration or side effect of his ACE inhibitor 10. Urinary hesitancy DC diphenhydramine and trazodone,monitor, avoid  Anticholinergic agents 11.  DM good control, pt concerned lantus may increase his CBG, we discussed this 12.  L knee pain chronic used sleeve and analgesic creme exam unremarkable will order LOS (Days) 7 A FACE TO FACE EVALUATION WAS PERFORMED  Kunal Levario E 08/25/2012,  9:23 AM

## 2012-08-25 NOTE — Progress Notes (Signed)
Speech Language Pathology Daily Session Note & Weekly Progress Note  Patient Details  Name: [name] MRN: [id number] Date of Birth: 1951/04/21  Today's Date: 08/25/2012 Time: 0900-0930 Time Calculation (min): 30 min  Short Term Goals: Week 1: SLP Short Term Goal 1 (Week 1): Pt. will implement speech strategies in conversation in a moderately noisy environment with min verbal cues.  SLP Short Term Goal 1 - Progress (Week 1): Met SLP Short Term Goal 2 (Week 1): Pt. will safely consume a Dys 3 diet and thin liquids with min verbal cues for swallow precautions.  SLP Short Term Goal 2 - Progress (Week 1): Met SLP Short Term Goal 3 (Week 1): Pt. will demonstrate executive functions during complex activities involving planning/organizing/self monitoring and correcting with min subtle cues.   SLP Short Term Goal 3 - Progress (Week 1): Met  Skilled Therapeutic Interventions: Treatment session focused on addressing dysphagia goals.  SLP performed skilled observation during consumption of medication whole with thin liquid cup sips and intermittent cues to perform hard throat clear/cough following each pill.  SLP facilitated session with trial of regular textures and patient was modified independence with use of compensatory strategies during trials with no observed s/s of aspiration.      FIM:  Comprehension Comprehension Mode: Auditory Comprehension: 6-Follows complex conversation/direction: With extra time/assistive device Expression Expression: 6-Expresses complex ideas: With extra time/assistive device Social Interaction Social Interaction: 6-Interacts appropriately with others with medication or extra time (anti-anxiety, antidepressant). Problem Solving Problem Solving: 6-Solves complex problems: With extra time Memory Memory: 6-Assistive device: No helper FIM - Eating Eating Activity: 5: Supervision/cues  Pain Pain Assessment Pain Assessment: No/denies pain  Therapy/Group:  Individual Therapy   Speech Language Pathology Weekly Progress Note  Patient Details  Name: [name] MRN: [id number] Date of Birth: 02/24/1951  Today's Date: 08/25/2012  Short Term Goals: Week 1: SLP Short Term Goal 1 (Week 1): Pt. will implement speech strategies in conversation in a moderately noisy environment with min verbal cues.  SLP Short Term Goal 1 - Progress (Week 1): Met SLP Short Term Goal 2 (Week 1): Pt. will safely consume a Dys 3 diet and thin liquids with min verbal cues for swallow precautions.  SLP Short Term Goal 2 - Progress (Week 1): Met SLP Short Term Goal 3 (Week 1): Pt. will demonstrate executive functions during complex activities involving planning/organizing/self monitoring and correcting with min subtle cues.   SLP Short Term Goal 3 - Progress (Week 1): Met Week 2: SLP Short Term Goal 1 (Week 2): Patient will implement speech strategies in conversation in a moderately noisy environment with supervision verbal cues.  SLP Short Term Goal 2 (Week 2): Patient will safely consume regular textures and thin liquids with modified independence for use swallow precautions.   Weekly Progress Updates: Patient met 3 out of 3 short term objectives this reporting period with gains in swallow function, use of speech intelligibility strategies and problem solving.  Patient has been mod I with consumption of Dys.3 textures and thin liquids and per today's session has been upgraded to regular textures.  Patient's overall speech intelligibly has improved but continues to be decreased in intensity which functionally impacts him in many home like and community environments.  Patient has met all problem solving goals and they will no longer be addressed in therapy.  While patient continues to demonstrate deficits which justify skilled SLP therapy it is recommended that his frequency change to 3 times a week due to overall functional gains.  SLP Frequency: 1-2 X/day, 30-60  minutes;3 out of 7 days Estimated Length of Stay: 1 week for SLP goals SLP Treatment/Interventions: Cueing hierarchy;Dysphagia/aspiration precaution training;Environmental controls;Functional tasks;Internal/external aids;Patient/family education;Speech/Language facilitation;Therapeutic Activities;Therapeutic Exercise  Carmelia Roller., CCC-SLP [id number]  [place] 08/25/2012, 1:18 PM

## 2012-08-25 NOTE — Plan of Care (Signed)
Problem: RH KNOWLEDGE DEFICIT Goal: RH STG INCREASE KNOWLEDGE OF DYSPHAGIA/FLUID INTAKE Outcome: Progressing Patient upgraded to carb modified regular textures, from dysphagia 3 10/4

## 2012-08-25 NOTE — Progress Notes (Signed)
Inpatient Diabetes Program Recommendations  AACE/ADA: New Consensus Statement on Inpatient Glycemic Control (2013)  Target Ranges:  Prepandial:   less than 140 mg/dL      Peak postprandial:   less than 180 mg/dL (1-2 hours)      Critically ill patients:  140 - 180 mg/dL   Reason for Visit: Hyperglycemia  Inpatient Diabetes Program Recommendations  AACE/ADA: New Consensus Statement on Inpatient Glycemic Control (2013)  Target Ranges:  Prepandial:   less than 140 mg/dL      Peak postprandial:   less than 180 mg/dL (1-2 hours)      Critically ill patients:  140 - 180 mg/dL   Reason for Visit: Hyperglycemia  Results for [name], [name] (MRN [id number]) as of 08/25/2012 13:19  Ref. Range 08/15/2012 05:20  Hemoglobin A1C Latest Range: <5.7 % 9.4 (H)   Results for [name], [name] (MRN [id number]) as of 08/25/2012 13:19  Ref. Range 08/24/2012 07:33 08/24/2012 11:30 08/24/2012 16:29 08/24/2012 20:32 08/25/2012 07:18 08/25/2012 12:04  Glucose-Capillary Latest Range: 70-99 mg/dL 151 (H) 226 (H) 111 (H) 210 (H) 127 (H) 191 (H)    Morning blood sugars seem to be ok.  It looks like pt's postprandial blood sugars are elevated.  Would recommend increasing meal coverage insulin to Novolog 4 units tidwc if pt eats > 50% meal.  May benefit from decreasing Lantus to 5 units QHS.  Will follow.

## 2012-08-26 ENCOUNTER — Inpatient Hospital Stay (HOSPITAL_COMMUNITY): Payer: Medicare Other | Admitting: Occupational Therapy

## 2012-08-26 LAB — GLUCOSE, CAPILLARY
Glucose-Capillary: 156 mg/dL — ABNORMAL HIGH (ref 70–99)
Glucose-Capillary: 159 mg/dL — ABNORMAL HIGH (ref 70–99)
Glucose-Capillary: 121 mg/dL — ABNORMAL HIGH (ref 70–99)
Glucose-Capillary: 134 mg/dL — ABNORMAL HIGH (ref 70–99)

## 2012-08-26 NOTE — Progress Notes (Signed)
Occupational Therapy Session Note  Patient Details  Name: [name] MRN: [id number] Date of Birth: February 17, 1951  Today's Date: 08/26/2012 Time: [id number] Time Calculation (min): 30 min  Short Term Goals: Week 2:  OT Short Term Goal 1 (Week 2): Patient will perform meal tray set up with Modified I using BUEs OT Short Term Goal 2 (Week 2): Patient will perform dynamic standing for LB bath and dressing with close supervision and no more than 3 cues for safe strategies. OT Short Term Goal 3 (Week 2): Patient will stand at commode to urinate with close supervision and use of grab bars for safety. OT Short Term Goal 4 (Week 2): Patient will stand at sink for shaving and brushing teeth with close supervision OT Short Term Goal 5 (Week 2): Patient will use LUE at a diminished level for all BADL & IADL tasks with supervision.  Skilled Therapeutic Interventions/Progress Updates:    1:1 neuro muscular reeducation: Pt reported he is "stiff today." Perform bed mobility with min A with mod sequential cues, min A squat pivot transfers bed<w/c<>mat. On mat had pt lay down on wedge pillow with feet on floor to stretch hips while perform PNF patterns with min A for control and smoothness of movement, transitioned to supine to perform trunk/abdominal activation with left UE following therapist's hand. Transitioned to standing performing functional reaching for horseshoes with right and left UE in PNF patterns (sqatting to retreive them and then extending to put them on basketball hoop. Lastly perform PNF patters in standing looking in the mirror. Pt able to self correct trunk rotation and to cue for maintaining left knee extension. Left LE continues to adducted with standing transitional movements.  Therapy Documentation Precautions:  Precautions Precautions: Fall Precaution Comments: vestibular Restrictions Weight Bearing Restrictions: No Pain:  no c/o pain in session  See FIM for current  functional status  Therapy/Group: Individual Therapy  Willeen Cass [facility] 08/26/2012, 3:10 PM

## 2012-08-26 NOTE — Progress Notes (Signed)
Patient ID: [name], male   DOB: [birth year], 61 y.o.   MRN: [id number] Patient ID: [name], male   DOB: 1951/01/09, 61 y.o.   MRN: [id number] Subjective/Complaints:   10/5.  Alert and offers no complaints this morning. Uneventful night. Chest clear to auscultation; cardiovascular normal heart sounds no murmur; abdomen soft nontender no organomegaly; extremities no edema, left hemiparesis  CBG (last 3)   Basename 08/26/12 0740 08/25/12 2052 08/25/12 1703  GLUCAP 156* 136* 98    BP Readings from Last 3 Encounters:  08/26/12 126/78  08/18/12 132/68  08/08/12 103/58    [name] is a 61 y.o. RH-male with a history of TIA, diabetes mellitus, hyperlipidemia, recent laryngoplasty 9/16 who presented with acute recurrent weakness and numbness involving his left side with facial weakness on 08/14/12 had a TIA with similar presentation in May 2013. Workup was unremarkable and patient placed on plavix for CVA prophylaxis. CT head negative. 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 2D echo done revealing EF 50-55% with grade 1 diastolic dysfunction. Carotid dopplers without ICA stenosis. Patient had worsening of symptoms past admission. 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 FEES done (patient has had problems with aspiration) and placed on D2, thin liquids with hard swallow. He is developing left sided spasms. 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  L Knee pain Review of Systems  Constitutional: Positive for malaise/fatigue.  HENT:       Vocal hoarseness  Respiratory: Positive for cough. Negative for sputum production, shortness of breath and wheezing.   Gastrointestinal: Positive for nausea. Negative for vomiting.  All other systems reviewed and are negative.   Objective: Vital Signs: Blood pressure 126/78,  pulse 77, temperature 97.8 F (36.6 C), temperature source Oral, resp. rate 18, height 5\' 11"  (1.803 m), weight 91 kg (200 lb 9.9 oz), SpO2 94.00%. No results found. Results for orders placed during the hospital encounter of 08/18/12 (from the past 72 hour(s))  GLUCOSE, CAPILLARY     Status: Abnormal   Collection Time   08/23/12 11:50 AM      Component Value Range Comment   Glucose-Capillary 162 (*) 70 - 99 mg/dL    Comment 1 Notify RN     GLUCOSE, CAPILLARY     Status: Abnormal   Collection Time   08/23/12  4:45 PM      Component Value Range Comment   Glucose-Capillary 136 (*) 70 - 99 mg/dL   GLUCOSE, CAPILLARY     Status: Abnormal   Collection Time   08/23/12  9:07 PM      Component Value Range Comment   Glucose-Capillary 123 (*) 70 - 99 mg/dL   GLUCOSE, CAPILLARY     Status: Abnormal   Collection Time   08/24/12  7:33 AM      Component Value Range Comment   Glucose-Capillary 151 (*) 70 - 99 mg/dL    Comment 1 Notify RN     GLUCOSE, CAPILLARY     Status: Abnormal   Collection Time   08/24/12 11:30 AM      Component Value Range Comment   Glucose-Capillary 226 (*) 70 - 99 mg/dL    Comment 1 Notify RN     GLUCOSE, CAPILLARY     Status: Abnormal   Collection Time   08/24/12  4:29 PM      Component Value Range Comment   Glucose-Capillary  111 (*) 70 - 99 mg/dL    Comment 1 Notify RN     GLUCOSE, CAPILLARY     Status: Abnormal   Collection Time   08/24/12  8:32 PM      Component Value Range Comment   Glucose-Capillary 210 (*) 70 - 99 mg/dL   GLUCOSE, CAPILLARY     Status: Abnormal   Collection Time   08/25/12  7:18 AM      Component Value Range Comment   Glucose-Capillary 127 (*) 70 - 99 mg/dL    Comment 1 Notify RN     GLUCOSE, CAPILLARY     Status: Abnormal   Collection Time   08/25/12 12:04 PM      Component Value Range Comment   Glucose-Capillary 191 (*) 70 - 99 mg/dL    Comment 1 Notify RN     GLUCOSE, CAPILLARY     Status: Normal   Collection Time   08/25/12  5:03 PM        Component Value Range Comment   Glucose-Capillary 98  70 - 99 mg/dL   GLUCOSE, CAPILLARY     Status: Abnormal   Collection Time   08/25/12  8:52 PM      Component Value Range Comment   Glucose-Capillary 136 (*) 70 - 99 mg/dL   GLUCOSE, CAPILLARY     Status: Abnormal   Collection Time   08/26/12  7:40 AM      Component Value Range Comment   Glucose-Capillary 156 (*) 70 - 99 mg/dL      HEENT: horse voice Cardio: RRR Resp: CTA B/L GI: BS positive Extremity:  No Edema Skin:   Intact Neuro: Alert/Oriented, Anxious, Abnormal Sensory reduced on left, Abnormal Motor 2 minus left deltoid bicep tricep grip as well as left hip flexor knee extensor 1 ankle dorsiflexor plantar flexor and Abnormal FMC Ataxic/ dec FMC and Tone  increased tone left abductors and extensors in the lower extremity Musc/Skel:  Other pain with resisted hip abduction. No tenderness over the lateral hip. No tenderness over the knee. Tenderness over the  medial hip adductors peripheral pulses normal, normal skin temperature  in the feet   Assessment/Plan: 1. 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 Physiatrist is providing close team supervision and 24 hour management of active medical problems listed below. Physiatrist and rehab team continue to assess barriers to discharge/monitor patient progress toward functional and medical goals. FIM: FIM - Bathing Bathing Steps Patient Completed: Chest;Right Arm;Left Arm;Abdomen;Front perineal area;Buttocks;Right lower leg (including foot);Left upper leg;Right upper leg;Left lower leg (including foot) Bathing: 4: Steadying assist  FIM - Upper Body Dressing/Undressing Upper body dressing/undressing steps patient completed: Thread/unthread right sleeve of  pullover shirt/dresss;Put head through opening of pull over shirt/dress;Pull shirt over trunk;Thread/unthread left sleeve of pullover shirt/dress Upper body dressing/undressing: 5: Set-up assist to: Obtain clothing/put away FIM - Lower Body Dressing/Undressing Lower body dressing/undressing steps patient completed: Thread/unthread right underwear leg;Thread/unthread left underwear leg;Pull underwear up/down;Thread/unthread right pants leg;Thread/unthread left pants leg;Pull pants up/down;Don/Doff right sock;Don/Doff right shoe;Don/Doff left shoe Lower body dressing/undressing: 4: Min-Patient completed 75 plus % of tasks  FIM - Toileting Toileting steps completed by patient: Adjust clothing prior to toileting;Performs perineal hygiene Toileting Assistive Devices: Grab bar or rail for support Toileting: 4: Steadying assist  FIM - Radio producer  Devices: Grab bars;Elevated toilet seat Toilet Transfers: 3-To toilet/BSC: Mod A (lift or lower assist);3-From toilet/BSC: Mod A (lift or lower assist)  FIM - Bed/Chair Transfer Bed/Chair Transfer Assistive Devices: Bed rails;Arm rests Bed/Chair Transfer: 4: Supine > Sit: Min A (steadying Pt. > 75%/lift 1 leg);4: Sit > Supine: Min A (steadying pt. > 75%/lift 1 leg);3: Bed > Chair or W/C: Mod A (lift or lower assist);3: Chair or W/C > Bed: Mod A (lift or lower assist)  FIM - Locomotion: Wheelchair Distance: 100 Locomotion: Wheelchair: 5: Travels 150 ft or more: maneuvers on rugs and over door sills with supervision, cueing or coaxing FIM - Locomotion: Ambulation Locomotion: Ambulation Assistive Devices: Administrator Ambulation/Gait Assistance: 3: Mod assist Locomotion: Ambulation: 1: Travels less than 50 ft with moderate assistance (Pt: 50 - 74%)  Comprehension Comprehension Mode: Auditory Comprehension: 6-Follows complex conversation/direction: With extra time/assistive device  Expression Expression Mode:  Verbal Expression: 6-Expresses complex ideas: With extra time/assistive device  Social Interaction Social Interaction: 6-Interacts appropriately with others with medication or extra time (anti-anxiety, antidepressant).  Problem Solving Problem Solving Mode: Not assessed Problem Solving: 6-Solves complex problems: With extra time  Memory Memory Mode: Not assessed Memory: 6-More than reasonable amt of time   Medical Problem List and Plan:  1. DVT Prophylaxis/Anticoagulation: Pharmaceutical: Lovenox  2. Pain Management: tramadol, tylenol. 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 Mood: egosupport by team. He is fairly motivated and seems to be in good spirits  4. Neuropsych: This patient is capable of making decisions on his/her own behalf.  5. HTN: Monitor with bid checks. Avoid hypotension for now. Continue atenolol, HCTZ and off  lisinopril.  6. Constipation (acute on chronic):this may be contribute to nausea. scheduled softener/laxative, suppository or enema if needed  7. Thrush: nystatin mouth wash  8. Hyperlipidemia: zocor 10mg  daily 9. 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 His swallowing improved postoperatively and has not noticeably worsened post stroke.Continues to have a cough which may be either a low-grade aspiration or side effect of his ACE inhibitor 10. Urinary hesitancy DC diphenhydramine and trazodone,monitor, avoid  Anticholinergic agents 11.  DM good control, pt concerned lantus may increase his CBG, we discussed this 12.  L knee pain chronic used sleeve and analgesic creme exam unremarkable will order LOS (Days) 8 A FACE TO FACE EVALUATION WAS PERFORMED  Nyoka Cowden 08/26/2012, 8:44 AM

## 2012-08-27 ENCOUNTER — Inpatient Hospital Stay (HOSPITAL_COMMUNITY): Payer: Medicare Other | Admitting: Occupational Therapy

## 2012-08-27 LAB — GLUCOSE, CAPILLARY
Glucose-Capillary: 163 mg/dL — ABNORMAL HIGH (ref 70–99)
Glucose-Capillary: 92 mg/dL (ref 70–99)
Glucose-Capillary: 147 mg/dL — ABNORMAL HIGH (ref 70–99)

## 2012-08-27 MED ORDER — WHITE PETROLATUM GEL
Status: AC
  Filled 2012-08-27: qty 5

## 2012-08-27 NOTE — Progress Notes (Signed)
Patient ID: [name], male   DOB: 29-Mar-1951, 61 y.o.   MRN: [id number] Patient ID: [name], male   DOB: [birth year], 61 y.o.   MRN: [id number] Patient ID: [name], male   DOB: Jun 15, 1951, 61 y.o.   MRN: [id number] Subjective/Complaints:   10/6.  Alert and offers no complaints this morning. Uneventful night. Capillary blood sugars remain well controlled. Blood pressure remained stable Chest clear to auscultation; cardiovascular normal heart sounds no murmur; abdomen soft nontender no organomegaly; extremities no edema, left hemiparesis  CBG (last 3)   Basename 08/27/12 0650 08/26/12 2057 08/26/12 1621  GLUCAP 163* 134* 121*    BP Readings from Last 3 Encounters:  08/27/12 133/84  08/18/12 132/68  08/08/12 103/58    [name] is a 61 y.o. RH-male with a history of TIA, diabetes mellitus, hyperlipidemia, recent laryngoplasty 9/16 who presented with acute recurrent weakness and numbness involving his left side with facial weakness on 08/14/12 had a TIA with similar presentation in May 2013. Workup was unremarkable and patient placed on plavix for CVA prophylaxis. CT head negative. 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 2D echo done revealing EF 50-55% with grade 1 diastolic dysfunction. Carotid dopplers without ICA stenosis. Patient had worsening of symptoms past admission. 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 FEES done (patient has had problems with aspiration) and placed on D2, thin liquids with hard swallow. He is developing left sided spasms. 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  L Knee pain Review of Systems  Constitutional: Positive for malaise/fatigue.  HENT:       Vocal hoarseness  Respiratory: Positive for cough. Negative for sputum production, shortness of breath and wheezing.     Gastrointestinal: Positive for nausea. Negative for vomiting.  All other systems reviewed and are negative.   Objective: Vital Signs: Blood pressure 133/84, pulse 78, temperature 97.5 F (36.4 C), temperature source Oral, resp. rate 18, height 5\' 11"  (1.803 m), weight 91 kg (200 lb 9.9 oz), SpO2 99.00%. No results found. Results for orders placed during the hospital encounter of 08/18/12 (from the past 72 hour(s))  GLUCOSE, CAPILLARY     Status: Abnormal   Collection Time   08/24/12 11:30 AM      Component Value Range Comment   Glucose-Capillary 226 (*) 70 - 99 mg/dL    Comment 1 Notify RN     GLUCOSE, CAPILLARY     Status: Abnormal   Collection Time   08/24/12  4:29 PM      Component Value Range Comment   Glucose-Capillary 111 (*) 70 - 99 mg/dL    Comment 1 Notify RN     GLUCOSE, CAPILLARY     Status: Abnormal   Collection Time   08/24/12  8:32 PM      Component Value Range Comment   Glucose-Capillary 210 (*) 70 - 99 mg/dL   GLUCOSE, CAPILLARY     Status: Abnormal   Collection Time   08/25/12  7:18 AM      Component Value Range Comment   Glucose-Capillary 127 (*) 70 - 99 mg/dL    Comment 1 Notify RN     GLUCOSE, CAPILLARY     Status: Abnormal   Collection Time   08/25/12 12:04 PM      Component Value Range Comment   Glucose-Capillary 191 (*) 70 - 99 mg/dL    Comment 1 Notify  RN     GLUCOSE, CAPILLARY     Status: Normal   Collection Time   08/25/12  5:03 PM      Component Value Range Comment   Glucose-Capillary 98  70 - 99 mg/dL   GLUCOSE, CAPILLARY     Status: Abnormal   Collection Time   08/25/12  8:52 PM      Component Value Range Comment   Glucose-Capillary 136 (*) 70 - 99 mg/dL   GLUCOSE, CAPILLARY     Status: Abnormal   Collection Time   08/26/12  7:40 AM      Component Value Range Comment   Glucose-Capillary 156 (*) 70 - 99 mg/dL   GLUCOSE, CAPILLARY     Status: Abnormal   Collection Time   08/26/12 12:08 PM      Component Value Range Comment    Glucose-Capillary 159 (*) 70 - 99 mg/dL   GLUCOSE, CAPILLARY     Status: Abnormal   Collection Time   08/26/12  4:21 PM      Component Value Range Comment   Glucose-Capillary 121 (*) 70 - 99 mg/dL   GLUCOSE, CAPILLARY     Status: Abnormal   Collection Time   08/26/12  8:57 PM      Component Value Range Comment   Glucose-Capillary 134 (*) 70 - 99 mg/dL    Comment 1 Notify RN     GLUCOSE, CAPILLARY     Status: Abnormal   Collection Time   08/27/12  6:50 AM      Component Value Range Comment   Glucose-Capillary 163 (*) 70 - 99 mg/dL      HEENT: horse voice Cardio: RRR Resp: CTA B/L GI: BS positive Extremity:  No Edema Skin:   Intact Neuro: Alert/Oriented, Anxious, Abnormal Sensory reduced on left, Abnormal Motor 2 minus left deltoid bicep tricep grip as well as left hip flexor knee extensor 1 ankle dorsiflexor plantar flexor and Abnormal FMC Ataxic/ dec FMC and Tone  increased tone left abductors and extensors in the lower extremity Musc/Skel:  Other pain with resisted hip abduction. No tenderness over the lateral hip. No tenderness over the knee. Tenderness over the  medial hip adductors peripheral pulses normal, normal skin temperature  in the feet   Assessment/Plan: 1. 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 Physiatrist is providing close team supervision and 24 hour management of active medical problems listed below. Physiatrist and rehab team continue to assess barriers to discharge/monitor patient progress toward functional and medical goals. FIM: FIM - Bathing Bathing Steps Patient Completed: Chest;Right Arm;Left Arm;Abdomen;Front perineal area;Buttocks;Right lower leg (including foot);Left upper leg;Right upper leg;Left lower leg (including foot) Bathing: 4:  Steadying assist  FIM - Upper Body Dressing/Undressing Upper body dressing/undressing steps patient completed: Thread/unthread right sleeve of pullover shirt/dresss;Put head through opening of pull over shirt/dress;Pull shirt over trunk;Thread/unthread left sleeve of pullover shirt/dress Upper body dressing/undressing: 5: Set-up assist to: Obtain clothing/put away FIM - Lower Body Dressing/Undressing Lower body dressing/undressing steps patient completed: Thread/unthread right underwear leg;Thread/unthread left underwear leg;Pull underwear up/down;Thread/unthread right pants leg;Thread/unthread left pants leg;Pull pants up/down;Don/Doff right sock;Don/Doff right shoe;Don/Doff left shoe Lower body dressing/undressing: 4: Min-Patient completed 75 plus % of tasks  FIM - Toileting Toileting steps completed by patient: Adjust clothing prior to toileting;Performs perineal hygiene Toileting Assistive Devices: Grab bar  or rail for support Toileting: 3: Mod-Patient completed 2 of 3 steps  FIM - Radio producer Devices: Elevated toilet seat;Grab bars;Walker Toilet Transfers: 3-To toilet/BSC: Mod A (lift or lower assist)  FIM - Control and instrumentation engineer Devices: Bed rails;Arm rests Bed/Chair Transfer: 4: Supine > Sit: Min A (steadying Pt. > 75%/lift 1 leg);4: Sit > Supine: Min A (steadying pt. > 75%/lift 1 leg);3: Bed > Chair or W/C: Mod A (lift or lower assist);3: Chair or W/C > Bed: Mod A (lift or lower assist)  FIM - Locomotion: Wheelchair Distance: 100 Locomotion: Wheelchair: 5: Travels 150 ft or more: maneuvers on rugs and over door sills with supervision, cueing or coaxing FIM - Locomotion: Ambulation Locomotion: Ambulation Assistive Devices: Administrator Ambulation/Gait Assistance: 3: Mod assist Locomotion: Ambulation: 1: Travels less than 50 ft with moderate assistance (Pt: 50 - 74%)  Comprehension Comprehension Mode:  Auditory Comprehension: 6-Follows complex conversation/direction: With extra time/assistive device  Expression Expression Mode: Verbal Expression: 6-Expresses complex ideas: With extra time/assistive device  Social Interaction Social Interaction: 6-Interacts appropriately with others with medication or extra time (anti-anxiety, antidepressant).  Problem Solving Problem Solving Mode: Not assessed Problem Solving: 6-Solves complex problems: With extra time  Memory Memory Mode: Not assessed Memory: 4-Recognizes or recalls 75 - 89% of the time/requires cueing 10 - 24% of the time   Medical Problem List and Plan:  1. DVT Prophylaxis/Anticoagulation: Pharmaceutical: Lovenox  2. Pain Management: tramadol, tylenol. 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 Mood: egosupport by team. He is fairly motivated and seems to be in good spirits  4. Neuropsych: This patient is capable of making decisions on his/her own behalf.  5. HTN: Monitor with bid checks. Avoid hypotension for now. Continue atenolol, HCTZ and off  lisinopril.  6. Constipation (acute on chronic):this may be contribute to nausea. scheduled softener/laxative, suppository or enema if needed  7. Thrush: nystatin mouth wash  8. Hyperlipidemia: zocor 10mg  daily 9. 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 His swallowing improved postoperatively and has not noticeably worsened post stroke.Continues to have a cough which may be either a low-grade aspiration or side effect of his ACE inhibitor 10. Urinary hesitancy DC diphenhydramine and trazodone,monitor, avoid  Anticholinergic agents 11.  DM good control, pt concerned lantus may increase his CBG, we discussed this 12.  L knee pain chronic used sleeve and analgesic creme exam unremarkable will  order LOS (Days) 9 A FACE TO FACE EVALUATION WAS PERFORMED  Nyoka Cowden 08/27/2012, 8:21 AM

## 2012-08-27 NOTE — Progress Notes (Signed)
Complained of left shoulder pain, PRN ultram given at [facility]. Kinesio tape in place to left shoulder and neck.Refused voltaren cream to left knee, reports he doesn't need it. PRN dulcolax supp, self administered, at 0150 with results at 0600. Reports problems with constipation PTA. [name] A

## 2012-08-27 NOTE — Progress Notes (Signed)
Occupational Therapy Session Note  Patient Details  Name: [name] MRN: [id number] Date of Birth: 02-Apr-1951  Today's Date: 08/27/2012 Time: 1005-1100 Time Calculation (min): 55 min  Short Term Goals: Week 2:  OT Short Term Goal 1 (Week 2): Patient will perform meal tray set up with Modified I using BUEs OT Short Term Goal 2 (Week 2): Patient will perform dynamic standing for LB bath and dressing with close supervision and no more than 3 cues for safe strategies. OT Short Term Goal 3 (Week 2): Patient will stand at commode to urinate with close supervision and use of grab bars for safety. OT Short Term Goal 4 (Week 2): Patient will stand at sink for shaving and brushing teeth with close supervision OT Short Term Goal 5 (Week 2): Patient will use LUE at a diminished level for all BADL & IADL tasks with supervision.  Skilled Therapeutic Interventions/Progress Updates:  Self care retraining to include shower, groom and dress.  Focus session on forced use of left UE & LE during all transitional movements, mobility and BADL tasks, weight bearing through LLE & LUE during sit><stands, dynamic standing balance.  Patient requires tactile and verbal cues for postural control, coordination, maintaining muscle activation on left side to include muscle grading and maintaining weight shift onto LLR as patient over uses right side.  Therapy Documentation Precautions:  Precautions Precautions: Fall Precaution Comments: vestibular Restrictions Weight Bearing Restrictions: No Pain: Denies pain just tightness.  See FIM for current functional status  Therapy/Group: Individual Therapy  Bawi Lakins 08/27/2012, 11:13 AM

## 2012-08-28 ENCOUNTER — Inpatient Hospital Stay (HOSPITAL_COMMUNITY): Payer: Medicare Other | Admitting: Occupational Therapy

## 2012-08-28 ENCOUNTER — Inpatient Hospital Stay (HOSPITAL_COMMUNITY): Payer: Medicare Other | Admitting: Physical Therapy

## 2012-08-28 ENCOUNTER — Inpatient Hospital Stay (HOSPITAL_COMMUNITY): Payer: Medicare Other | Admitting: Speech Pathology

## 2012-08-28 LAB — GLUCOSE, CAPILLARY
Glucose-Capillary: 71 mg/dL (ref 70–99)
Glucose-Capillary: 118 mg/dL — ABNORMAL HIGH (ref 70–99)
Glucose-Capillary: 174 mg/dL — ABNORMAL HIGH (ref 70–99)
Glucose-Capillary: 120 mg/dL — ABNORMAL HIGH (ref 70–99)
Glucose-Capillary: 135 mg/dL — ABNORMAL HIGH (ref 70–99)

## 2012-08-28 MED ORDER — PREGABALIN 25 MG PO CAPS
50.0000 mg | ORAL_CAPSULE | Freq: Two times a day (BID) | ORAL | Status: DC
  Administered 2012-08-28 – 2012-08-31 (×7): 50 mg via ORAL
  Filled 2012-08-28: qty 2
  Filled 2012-08-28: qty 1
  Filled 2012-08-28 (×6): qty 2

## 2012-08-28 NOTE — Progress Notes (Signed)
Patient ID: [name], male   DOB: 10-Feb-1951, 61 y.o.   MRN: [id number] Subjective/Complaints: [name] is a 61 y.o. RH-male with a history of TIA, diabetes mellitus, hyperlipidemia, recent laryngoplasty 9/16 who presented with acute recurrent weakness and numbness involving his left side with facial weakness on 08/14/12 had a TIA with similar presentation in May 2013. Workup was unremarkable and patient placed on plavix for CVA prophylaxis. CT head negative. 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 2D echo done revealing EF 50-55% with grade 1 diastolic dysfunction. Carotid dopplers without ICA stenosis. Patient had worsening of symptoms past admission. 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 FEES done (patient has had problems with aspiration) and placed on D2, thin liquids with hard swallow. He is developing left sided spasms. .  L Knee pain Review of Systems  Constitutional: Positive for malaise/fatigue.  HENT:       Vocal hoarseness  Respiratory: Positive for cough. Negative for sputum production, shortness of breath and wheezing.   Gastrointestinal: Positive for nausea. Negative for vomiting.  All other systems reviewed and are negative.   Objective: Vital Signs: Blood pressure 121/75, pulse 72, temperature 98.1 F (36.7 C), temperature source Oral, resp. rate 17, height 5\' 11"  (1.803 m), weight 91 kg (200 lb 9.9 oz), SpO2 99.00%. No results found. Results for orders placed during the hospital encounter of 08/18/12 (from the past 72 hour(s))  GLUCOSE, CAPILLARY     Status: Abnormal   Collection Time   08/25/12 12:04 PM      Component Value Range Comment   Glucose-Capillary 191 (*) 70 - 99 mg/dL    Comment 1 Notify RN     GLUCOSE, CAPILLARY     Status: Normal   Collection Time   08/25/12  5:03 PM      Component Value  Range Comment   Glucose-Capillary 98  70 - 99 mg/dL   GLUCOSE, CAPILLARY     Status: Abnormal   Collection Time   08/25/12  8:52 PM      Component Value Range Comment   Glucose-Capillary 136 (*) 70 - 99 mg/dL   GLUCOSE, CAPILLARY     Status: Abnormal   Collection Time   08/26/12  7:40 AM      Component Value Range Comment   Glucose-Capillary 156 (*) 70 - 99 mg/dL   GLUCOSE, CAPILLARY     Status: Abnormal   Collection Time   08/26/12 12:08 PM      Component Value Range Comment   Glucose-Capillary 159 (*) 70 - 99 mg/dL   GLUCOSE, CAPILLARY     Status: Abnormal   Collection Time   08/26/12  4:21 PM      Component Value Range Comment   Glucose-Capillary 121 (*) 70 - 99 mg/dL   GLUCOSE, CAPILLARY     Status: Abnormal   Collection Time   08/26/12  8:57 PM      Component Value Range Comment   Glucose-Capillary 134 (*) 70 - 99 mg/dL    Comment 1 Notify RN     GLUCOSE, CAPILLARY     Status: Abnormal   Collection Time   08/27/12  6:50 AM      Component Value Range Comment   Glucose-Capillary 163 (*) 70 - 99 mg/dL   GLUCOSE, CAPILLARY     Status: Normal   Collection Time   08/27/12 11:40 AM  Component Value Range Comment   Glucose-Capillary 92  70 - 99 mg/dL   GLUCOSE, CAPILLARY     Status: Abnormal   Collection Time   08/27/12  4:35 PM      Component Value Range Comment   Glucose-Capillary 147 (*) 70 - 99 mg/dL   GLUCOSE, CAPILLARY     Status: Normal   Collection Time   08/27/12  8:15 PM      Component Value Range Comment   Glucose-Capillary 71  70 - 99 mg/dL    Comment 1 Notify RN     GLUCOSE, CAPILLARY     Status: Abnormal   Collection Time   08/28/12  7:28 AM      Component Value Range Comment   Glucose-Capillary 118 (*) 70 - 99 mg/dL      HEENT: horse voice Cardio: RRR Resp: CTA B/L GI: BS positive Extremity:  No Edema Skin:   Intact Neuro: Alert/Oriented, Anxious, Abnormal Sensory reduced on left, Abnormal Motor 2 minus left deltoid bicep tricep grip as well as left  hip flexor knee extensor 1 ankle dorsiflexor plantar flexor and Abnormal FMC Ataxic/ dec FMC and Tone  increased tone left abductors and extensors in the lower extremity Musc/Skel:  Other pain with resisted hip abduction. No tenderness over the lateral hip. No tenderness over the knee. Tenderness over the  medial hip adductors peripheral pulses normal, normal skin temperature  in the feet   Assessment/Plan: 1. 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 Physiatrist is providing close team supervision and 24 hour management of active medical problems listed below. Physiatrist and rehab team continue to assess barriers to discharge/monitor patient progress toward functional and medical goals. FIM: FIM - Bathing Bathing Steps Patient Completed: Chest;Right Arm;Left Arm;Abdomen;Front perineal area;Buttocks;Right lower leg (including foot);Left upper leg;Right upper leg;Left lower leg (including foot) Bathing: 4: Steadying assist  FIM - Upper Body Dressing/Undressing Upper body dressing/undressing steps patient completed: Thread/unthread right sleeve of pullover shirt/dresss;Thread/unthread left sleeve of pullover shirt/dress;Put head through opening of pull over shirt/dress;Pull shirt over trunk Upper body dressing/undressing: 5: Set-up assist to: Obtain clothing/put away FIM - Lower Body Dressing/Undressing Lower body dressing/undressing steps patient completed: Thread/unthread right underwear leg;Thread/unthread left underwear leg;Pull underwear up/down;Thread/unthread right pants leg;Thread/unthread left pants leg;Pull pants up/down;Don/Doff right sock;Don/Doff left sock (declined to donn shoes today) Lower body dressing/undressing: 4: Steadying Assist  FIM - Toileting Toileting  steps completed by patient: Adjust clothing prior to toileting;Performs perineal hygiene Toileting Assistive Devices: Grab bar or rail for support Toileting: 3: Mod-Patient completed 2 of 3 steps  FIM - Radio producer Devices: Elevated toilet seat;Grab bars;Walker Toilet Transfers: 3-To toilet/BSC: Mod A (lift or lower assist)  FIM - Control and instrumentation engineer Devices: Bed rails;Arm rests Bed/Chair Transfer: 3: Chair or W/C > Bed: Mod A (lift or lower assist) (stand step)  FIM - Locomotion: Wheelchair Distance: 100 Locomotion: Wheelchair: 5: Travels 150 ft or more: maneuvers on rugs and over door sills with supervision, cueing or coaxing FIM - Locomotion: Ambulation Locomotion: Ambulation Assistive Devices: Administrator Ambulation/Gait Assistance: 3: Mod assist Locomotion: Ambulation: 1: Travels less than 50 ft with moderate assistance (Pt: 50 - 74%)  Comprehension Comprehension Mode: Auditory Comprehension: 6-Follows complex conversation/direction: With extra time/assistive device  Expression Expression Mode: Verbal Expression: 6-Expresses complex ideas:  With extra time/assistive device  Social Interaction Social Interaction: 6-Interacts appropriately with others with medication or extra time (anti-anxiety, antidepressant).  Problem Solving Problem Solving Mode: Not assessed Problem Solving: 6-Solves complex problems: With extra time  Memory Memory Mode: Not assessed Memory: 4-Recognizes or recalls 75 - 89% of the time/requires cueing 10 - 24% of the time   Medical Problem List and Plan:  1. DVT Prophylaxis/Anticoagulation: Pharmaceutical: Lovenox  2. Pain Management: tramadol, tylenol.   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 Mood: egosupport by team. He is fairly motivated and seems to be in good  spirits  4. Neuropsych: This patient is capable of making decisions on his/her own behalf.  5. HTN: Monitor with bid checks. Avoid hypotension for now. Continue atenolol, HCTZ and off  lisinopril.  6. Constipation (acute on chronic):this may be contribute to nausea. scheduled softener/laxative, suppository or enema if needed  7. Thrush: nystatin mouth wash  8. Hyperlipidemia: zocor 10mg  daily 9. 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 His swallowing improved postoperatively and has not noticeably worsened post stroke.Continues to have a cough which may be either a low-grade aspiration or side effect of his ACE inhibitor 10. Urinary hesitancy DC diphenhydramine and trazodone,monitor, avoid  Anticholinergic agents 11.  DM good control, pt concerned lantus may increase his CBG, we discussed this 12.  L knee pain chronic used sleeve and analgesic creme exam unremarkable will order LOS (Days) 10 A FACE TO FACE EVALUATION WAS PERFORMED  Mikal Blasdell E 08/28/2012, 7:44 AM

## 2012-08-28 NOTE — Progress Notes (Signed)
Occupational Therapy Session Note  Patient Details  Name: [name] MRN: [id number] Date of Birth: 22-Aug-1951  Today's Date: 08/28/2012 Time: 0800-0900 Time Calculation (min): 60 min  Short Term Goals: Week 2:  OT Short Term Goal 1 (Week 2): Patient will perform meal tray set up with Modified I using BUEs OT Short Term Goal 2 (Week 2): Patient will perform dynamic standing for LB bath and dressing with close supervision and no more than 3 cues for safe strategies. OT Short Term Goal 3 (Week 2): Patient will stand at commode to urinate with close supervision and use of grab bars for safety. OT Short Term Goal 4 (Week 2): Patient will stand at sink for shaving and brushing teeth with close supervision OT Short Term Goal 5 (Week 2): Patient will use LUE at a diminished level for all BADL & IADL tasks with supervision.  Skilled Therapeutic Interventions/Progress Updates:  Self care retraining to include shower, dress and groom.  Focus session on stand step transfers bed>w/c><tub bench, ambulate to sink from bathroom with RW, forced use of LUE, postural alignment/control, weight shifts and weight bearing onto LLE during standing tasks.  Patient reports he is anticipating left knee pain and does not want to bear weight into LLE.  Have reviewed with patient in the past the therapeutic benefits of weight bearing through LLE & LUE and reminded him of it again today.  Patient has refused knee brace and muscle rub cream that have been ordered to reduce pain and often declines to take medication related to his knee pain.    Therapy Documentation Precautions:  Precautions Precautions: Fall Precaution Comments: vestibular Restrictions Weight Bearing Restrictions: No  Pain: Denies pain  See FIM for current functional status  Therapy/Group: Individual Therapy  Emigdio Wildeman 08/28/2012, 10:35 AM

## 2012-08-28 NOTE — Progress Notes (Signed)
Physical Therapy Note  Patient Details  Name: Neftali Hartling MRN: [id number] Date of Birth: 03/12/1951 Today's Date: 08/28/2012  1100-1155 (55 minutes) individual Pain: no complaint of pain Focus of treatment: neuro re -ed LT LE in supine /standing; gait training Treatment: Initial gait - 80 feet RW min/mod assist with LT hip retraction/decreased hip extension in stance with forward trunk flexion in stance; standing with RT UE support stepping forward /backward with RT LE with tactile cues to facilitate LT hip/knee extension in stance ; up/down 4 inch step LT LE only for hip/knee extension control/strengthening'; gait 80 feet X 2 with instructional cues to decrease LT hip retraction in stance (pt able to perform 50% frequency improving knee control in stance.)   1400-1440 (40 minutes) individual Pain: no complaint of pain Focus of treatment: Neuro re-ed Lt LE in closed chain positions; gait training Treatment: stepping forward/baclkward RT LE to facilitate terminal hip/knee extension in stance with RT UE support; up/down 4 inch step LT LE X 10 for quad control/strengthening; gait sideways (hip abduction) along mat hands on therapist shoulders mod assist (decreased step length); gait with RW 80 feet X 2 min/mod assist for balance with improving LT hip extension in stance. Pt has increased difficulty when changing direction with loss of balance.  Darrell Leonhardt,JIM 08/28/2012, 7:41 AM

## 2012-08-28 NOTE — Progress Notes (Signed)
Speech Language Pathology Daily Session Note  Patient Details  Name: Shequille Wasdin MRN: [id number] Date of Birth: 12/04/1950  Today's Date: 08/28/2012 Time: [id number] Time Calculation (min): 20 min  Short Term Goals: Week 2: SLP Short Term Goal 1 (Week 2): Patient will implement speech strategies in conversation in a moderately noisy environment with supervision verbal cues.  SLP Short Term Goal 2 (Week 2): Patient will safely consume regular textures and thin liquids with modified independence for use swallow precautions.   Skilled Therapeutic Interventions: Treatment session focused on addressing speech goals by educating regarding vocal hygiene procedures and easy onset/prolonged phonation to increase vocal fold function.  SLP also facilitated session by cuing patient to better coordinate breathing and onset of easy phonation.  SLP left patient with a handout to facilitate carryover/recall of information.     FIM:  Comprehension Comprehension Mode: Auditory Comprehension: 6-Follows complex conversation/direction: With extra time/assistive device Expression Expression Mode: Verbal Expression: 6-Expresses complex ideas: With extra time/assistive device Social Interaction Social Interaction: 6-Interacts appropriately with others with medication or extra time (anti-anxiety, antidepressant). Problem Solving Problem Solving: 6-Solves complex problems: With extra time Memory Memory: 5-Requires cues to use assistive device  Pain Pain Assessment Pain Assessment: No/denies pain  Therapy/Group: Individual Therapy  Carmelia Roller., CCC-SLP [id number]  [place] 08/28/2012, 1:23 PM

## 2012-08-29 ENCOUNTER — Inpatient Hospital Stay (HOSPITAL_COMMUNITY): Payer: Medicare Other | Admitting: Occupational Therapy

## 2012-08-29 ENCOUNTER — Inpatient Hospital Stay (HOSPITAL_COMMUNITY): Payer: Medicare Other | Admitting: *Deleted

## 2012-08-29 ENCOUNTER — Inpatient Hospital Stay (HOSPITAL_COMMUNITY): Payer: Medicare Other | Admitting: Speech Pathology

## 2012-08-29 LAB — GLUCOSE, CAPILLARY
Glucose-Capillary: 115 mg/dL — ABNORMAL HIGH (ref 70–99)
Glucose-Capillary: 125 mg/dL — ABNORMAL HIGH (ref 70–99)
Glucose-Capillary: 111 mg/dL — ABNORMAL HIGH (ref 70–99)
Glucose-Capillary: 114 mg/dL — ABNORMAL HIGH (ref 70–99)

## 2012-08-29 MED ORDER — TIZANIDINE HCL 2 MG PO TABS
2.0000 mg | ORAL_TABLET | Freq: Three times a day (TID) | ORAL | Status: DC
  Administered 2012-08-29 – 2012-09-05 (×23): 2 mg via ORAL
  Filled 2012-08-29 (×31): qty 1

## 2012-08-29 NOTE — Progress Notes (Signed)
Social Work Patient ID: [name], male   DOB: July 30, 1951, 61 y.o.   MRN: [id number] Met with pt to discuss discharge plan.  His wife and him are separated and do not live together, therefore she will not be providing 24 hr care at [facility] though she told this worker she would be.  Pt reports he lives with his sister in-law and brother in-law and they are in and out. But also not there all of the time.  Will discuss with team and see if pt would be able to get to a level he could stay alone and be safe.  Will address when wife is Here.  Continue to work on a safe discharge plan.

## 2012-08-29 NOTE — Progress Notes (Signed)
Physical Therapy Session Note  Patient Details  Name: [name] MRN: [id number] Date of Birth: 05/02/1951  Today's Date: 08/29/2012 Time: 1100-1202 Time Calculation (min): 62 min  Short Term Goals: Week 2:  PT Short Term Goal 1 (Week 2): Pt will perform sit to stand x 5 with symmetry S PT Short Term Goal 1 - Progress (Week 2): Progressing toward goal PT Short Term Goal 2 (Week 2): Pt will gait 50 feet controlled environement close S PT Short Term Goal 2 - Progress (Week 2): Progressing toward goal PT Short Term Goal 3 (Week 2): Pt will gait household environment 25 ft min A PT Short Term Goal 3 - Progress (Week 2): Progressing toward goal  Skilled Therapeutic Interventions/Progress Updates:    NMR to trunk UE and LLE- seated PNF with facilitation to trunk and LUE; sit to stands with theraband attached to cane that resisted him as he stood and pressed cane overhead- significant improvement in trunk and postural control with repetition and used mirror for visual feedback- needed facilitation to L femur to prevent significant pulling in to IR; SLS on LLE with improved but still impaired knee control and would lean L with fatigue with BUE support- all activity to bombard sensory system to aid control for mobility- gait with RW with facilitation down thru pelvis and for forward translation L stance with improved quality today 80 x 2, 50 Bed mobility on regular bed with cues for normal movement paterns and to not use UE to aid LLE onto bed- increased time close S to min A  Inattention to RUE noted, continue to enourage him to push up and reach back with LUE to increase weight shift L and attention to LUE  Pt reports he is still trying eye exercises but difficulty stabilizing eyes- encouraged him to be 4 ft away from target  Therapy Documentation Precautions:  Precautions Precautions: Fall Precaution Comments: vestibular Restrictions Weight Bearing Restrictions: No   Pain:none     M  Locomotion : Ambulation Ambulation/Gait Assistance: 4: Min assist   See FIM for current functional status  Therapy/Group: Individual Therapy  Othelia Pulling 08/29/2012, 12:29 PM

## 2012-08-29 NOTE — Progress Notes (Signed)
Occupational Therapy Session Notes  Patient Details  Name: Marisa Lathe MRN: [id number] Date of Birth: Mar 02, 1951  Today's Date: 08/29/2012 Time: 0800-0900 and 100-150 Time Calculation (min): 60 min and 50 min  Short Term Goals: Week 2:  OT Short Term Goal 1 (Week 2): Patient will perform meal tray set up with Modified I using BUEs OT Short Term Goal 2 (Week 2): Patient will perform dynamic standing for LB bath and dressing with close supervision and no more than 3 cues for safe strategies. OT Short Term Goal 3 (Week 2): Patient will stand at commode to urinate with close supervision and use of grab bars for safety. OT Short Term Goal 4 (Week 2): Patient will stand at sink for shaving and brushing teeth with close supervision OT Short Term Goal 5 (Week 2): Patient will use LUE at a diminished level for all BADL & IADL tasks with supervision.  Skilled Therapeutic Interventions/Progress Updates:  1)  Self care retraining to include toileting (stand to urinate), shower and dress.  Focus session on performing bathroom tasks within the environmental context of his bathroom at home specific to beginning to discuss and practice methods to overcome those environmental barriers to improve his safety and independence.  When patient stood from w/c to urinate he had significant LOB to the left "my left foot still asleep since I just woke up".  Patient leaned heavily against wall and required mod assist to recover.  Patient reports that at home he will have a sink to his right and plans to have a grab bar installed at the toilet to assist him to be safe with this task.  2)  Patient's wife, patient's sister in law and brother in law present upon arrival to discuss bathroom setup at home.  Continued to review recommendations for patient to be safe and as independent as possible once he goes home to include grab bar placement in the shower area and the bathroom area, patient performed sit><stand and  demonstrated how he would use the commode at home and where he would stabilize himself.  Patient drew picture of bathroom and family to take photos of bathroom to assist with recommendations.  Therapy Documentation Precautions:  Precautions Precautions: Fall Precaution Comments: vestibular Restrictions Weight Bearing Restrictions: No Pain: Reports left shoulder pain/tightness, MD aware, not rated  See FIM for current functional status  Therapy/Group: Individual Therapy  Eden Rho 08/29/2012, 8:53 AM

## 2012-08-29 NOTE — Progress Notes (Signed)
Patient ID: [name], male   DOB: [birth year], 61 y.o.   MRN: [id number] Subjective/Complaints: [name] is a 61 y.o. RH-male with a history of TIA, diabetes mellitus, hyperlipidemia, recent laryngoplasty 9/16 who presented with acute recurrent weakness and numbness involving his left side with facial weakness on 08/14/12 had a TIA with similar presentation in May 2013. Workup was unremarkable and patient placed on plavix for CVA prophylaxis. CT head negative. 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 2D echo done revealing EF 50-55% with grade 1 diastolic dysfunction. Carotid dopplers without ICA stenosis. Patient had worsening of symptoms past admission. 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 FEES done (patient has had problems with aspiration) and placed on D2, thin liquids with hard swallow. He is developing left sided spasms. .   OT notes increasing tone in LUE and LLE Foot sweliing at end of day Review of Systems  Constitutional: Positive for malaise/fatigue.  HENT:       Vocal hoarseness  Respiratory: Positive for cough. Negative for sputum production, shortness of breath and wheezing.   Gastrointestinal: Positive for nausea. Negative for vomiting.  All other systems reviewed and are negative.   Objective: Vital Signs: Blood pressure 129/83, pulse 87, temperature 97.9 F (36.6 C), temperature source Oral, resp. rate 18, height 5\' 11"  (1.803 m), weight 91 kg (200 lb 9.9 oz), SpO2 100.00%. No results found. Results for orders placed during the hospital encounter of 08/18/12 (from the past 72 hour(s))  GLUCOSE, CAPILLARY     Status: Abnormal   Collection Time   08/26/12 12:08 PM      Component Value Range Comment   Glucose-Capillary 159 (*) 70 - 99 mg/dL   GLUCOSE, CAPILLARY     Status: Abnormal   Collection Time   08/26/12  4:21 PM      Component Value Range Comment   Glucose-Capillary 121 (*) 70 - 99 mg/dL   GLUCOSE, CAPILLARY     Status: Abnormal   Collection Time   08/26/12  8:57 PM      Component Value Range Comment   Glucose-Capillary 134 (*) 70 - 99 mg/dL    Comment 1 Notify RN     GLUCOSE, CAPILLARY     Status: Abnormal   Collection Time   08/27/12  6:50 AM      Component Value Range Comment   Glucose-Capillary 163 (*) 70 - 99 mg/dL   GLUCOSE, CAPILLARY     Status: Normal   Collection Time   08/27/12 11:40 AM      Component Value Range Comment   Glucose-Capillary 92  70 - 99 mg/dL   GLUCOSE, CAPILLARY     Status: Abnormal   Collection Time   08/27/12  4:35 PM      Component Value Range Comment   Glucose-Capillary 147 (*) 70 - 99 mg/dL   GLUCOSE, CAPILLARY     Status: Normal   Collection Time   08/27/12  8:15 PM      Component Value Range Comment   Glucose-Capillary 71  70 - 99 mg/dL    Comment 1 Notify RN     GLUCOSE, CAPILLARY     Status: Abnormal   Collection Time   08/28/12  7:28 AM      Component Value Range Comment   Glucose-Capillary 118 (*) 70 - 99 mg/dL   GLUCOSE, CAPILLARY     Status:  Abnormal   Collection Time   08/28/12 11:53 AM      Component Value Range Comment   Glucose-Capillary 174 (*) 70 - 99 mg/dL   GLUCOSE, CAPILLARY     Status: Abnormal   Collection Time   08/28/12  4:10 PM      Component Value Range Comment   Glucose-Capillary 120 (*) 70 - 99 mg/dL   GLUCOSE, CAPILLARY     Status: Abnormal   Collection Time   08/28/12  8:46 PM      Component Value Range Comment   Glucose-Capillary 135 (*) 70 - 99 mg/dL    Comment 1 Notify RN     GLUCOSE, CAPILLARY     Status: Abnormal   Collection Time   08/29/12  7:17 AM      Component Value Range Comment   Glucose-Capillary 115 (*) 70 - 99 mg/dL    Comment 1 Notify RN        HEENT: horse voice Cardio: RRR Resp: CTA B/L GI: BS positive Extremity:  No Edema Skin:   Intact Neuro: Alert/Oriented, Anxious, Abnormal  Sensory reduced on left, Abnormal Motor 2 minus left deltoid bicep tricep grip as well as left hip flexor knee extensor 1 ankle dorsiflexor plantar flexor and Abnormal FMC Ataxic/ dec FMC and Tone  increased tone left abductors and extensors in the lower extremity Musc/Skel:  Other pain with resisted hip abduction. No tenderness over the lateral hip. No tenderness over the knee. Tenderness over the  medial hip adductors peripheral pulses normal, normal skin temperature  in the feet Ashworth 1 spasticity but appears to incre3ase with standing start low dose zanaflex Assessment/Plan: 1. 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 Physiatrist is providing close team supervision and 24 hour management of active medical problems listed below. Physiatrist and rehab team continue to assess barriers to discharge/monitor patient progress toward functional and medical goals. FIM: FIM - Bathing Bathing Steps Patient Completed: Chest;Right Arm;Left Arm;Abdomen;Front perineal area;Buttocks;Right lower leg (including foot);Left upper leg;Right upper leg;Left lower leg (including foot) Bathing: 4: Steadying assist  FIM - Upper Body Dressing/Undressing Upper body dressing/undressing steps patient completed: Thread/unthread right sleeve of pullover shirt/dresss;Thread/unthread left sleeve of pullover shirt/dress;Put head through opening of pull over shirt/dress;Pull shirt over trunk Upper body dressing/undressing: 5: Set-up assist to: Obtain clothing/put away FIM - Lower Body Dressing/Undressing Lower body dressing/undressing steps patient completed: Thread/unthread right underwear leg;Thread/unthread left underwear leg;Pull underwear up/down;Thread/unthread right pants leg;Thread/unthread left pants  leg;Pull pants up/down;Don/Doff right sock;Don/Doff left sock (declined to donn shoes today) Lower body dressing/undressing: 4: Steadying Assist  FIM - Toileting Toileting steps completed by patient: Adjust clothing prior to toileting;Performs perineal hygiene Toileting Assistive Devices: Grab bar or rail for support Toileting: 3: Mod-Patient completed 2 of 3 steps  FIM - Radio producer Devices: Elevated toilet seat;Grab bars;Walker Toilet Transfers: 3-To toilet/BSC: Mod A (lift or lower assist)  FIM - Control and instrumentation engineer Devices: Bed rails;Arm rests Bed/Chair Transfer: 3: Chair or W/C > Bed: Mod A (lift or lower assist) (stand step)  FIM - Locomotion: Wheelchair Distance: 100 Locomotion: Wheelchair: 5: Travels 150 ft or more: maneuvers on rugs and over door sills with supervision, cueing or coaxing FIM - Locomotion: Ambulation Locomotion: Ambulation Assistive Devices: Administrator Ambulation/Gait Assistance: 3: Mod assist Locomotion: Ambulation:  1: Travels less than 50 ft with moderate assistance (Pt: 50 - 74%)  Comprehension Comprehension Mode: Auditory Comprehension: 6-Follows complex conversation/direction: With extra time/assistive device  Expression Expression Mode: Verbal Expression: 6-Expresses complex ideas: With extra time/assistive device  Social Interaction Social Interaction: 6-Interacts appropriately with others with medication or extra time (anti-anxiety, antidepressant).  Problem Solving Problem Solving Mode: Not assessed Problem Solving: 6-Solves complex problems: With extra time  Memory Memory Mode: Not assessed Memory: 5-Recognizes or recalls 90% of the time/requires cueing < 10% of the time   Medical Problem List and Plan:  1. DVT Prophylaxis/Anticoagulation: Pharmaceutical: Lovenox  2. Pain Management: tramadol, tylenol.   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 Mood: egosupport by team. He is fairly motivated and seems to be in good spirits  4. Neuropsych: This patient is capable of making decisions on his/her own behalf.  5. HTN: Monitor with bid checks. Avoid hypotension for now. Continue atenolol, HCTZ and off  lisinopril.  6. Constipation (acute on chronic):this may be contribute to nausea. scheduled softener/laxative, suppository or enema if needed  7. Thrush: nystatin mouth wash  8. Hyperlipidemia: zocor 10mg  daily 9. 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 His swallowing improved postoperatively and has not noticeably worsened post stroke.Continues to have a cough which may be either a low-grade aspiration or side effect of his ACE inhibitor 10. Urinary hesitancy DC diphenhydramine and trazodone,monitor, avoid  Anticholinergic agents 11.  DM good control, pt concerned lantus may increase his CBG, we discussed this 12.  L knee pain chronic used sleeve and analgesic creme exam unremarkable will order LOS (Days) 11 A FACE TO FACE EVALUATION WAS PERFORMED  Caswell Alvillar E 08/29/2012, 8:32 AM

## 2012-08-29 NOTE — Progress Notes (Signed)
Speech Language Pathology Daily Session Note  Patient Details  Name: [name] MRN: [id number] Date of Birth: [birth year]  Today's Date: 08/29/2012 Time: [id number] Time Calculation (min): 20 min  Short Term Goals: Week 2: SLP Short Term Goal 1 (Week 2): Patient will implement speech strategies in conversation in a moderately noisy environment with supervision verbal cues.  SLP Short Term Goal 2 (Week 2): Patient will safely consume regular textures and thin liquids with modified independence for use swallow precautions.   Skilled Therapeutic Interventions: Treatment session focused on addressing dysphagia goals.  SLP facilitated session with trials of thin liquids via straw.  Patient reported intermittent "tickle" in throat SLP educated patient to perform throat clears as needed, which effectively cleared.  SLP also facilitated session by requesting patient recall vocal hygiene program, taught in yesterday's session. Patient recalled 60% of information and then said that he could not recall the rest but was able to verbalize what to do (use memory aid ) and where it was.  SLP then facilitated session with encouragement to initiate getting out the memory aid to use it. SLP recommends upgrade to use of straw as wanted/needed.    FIM:  Comprehension Comprehension Mode: Auditory Comprehension: 6-Follows complex conversation/direction: With extra time/assistive device Expression Expression Mode: Verbal Expression: 6-Expresses complex ideas: With extra time/assistive device Social Interaction Social Interaction: 6-Interacts appropriately with others with medication or extra time (anti-anxiety, antidepressant). Problem Solving Problem Solving: 6-Solves complex problems: With extra time Memory Memory: 5-Requires cues to use assistive device FIM - Eating Eating Activity: 5: Supervision/cues;5: Set-up assist for open containers  Pain Pain Assessment Pain Assessment: No/denies  pain  Therapy/Group: Individual Therapy  Carmelia Roller., [place] [id number]  [place] 08/29/2012, 2:33 PM

## 2012-08-30 ENCOUNTER — Inpatient Hospital Stay (HOSPITAL_COMMUNITY): Payer: Medicare Other | Admitting: Occupational Therapy

## 2012-08-30 ENCOUNTER — Inpatient Hospital Stay (HOSPITAL_COMMUNITY): Payer: Medicare Other | Admitting: Physical Therapy

## 2012-08-30 LAB — GLUCOSE, CAPILLARY
Glucose-Capillary: 136 mg/dL — ABNORMAL HIGH (ref 70–99)
Glucose-Capillary: 101 mg/dL — ABNORMAL HIGH (ref 70–99)

## 2012-08-30 NOTE — Progress Notes (Signed)
Physical Therapy Session Note  Patient Details  Name: [name] MRN: [id number] Date of Birth: [birth year]  Today's Date: 08/30/2012 Time: [id number] Time Calculation (min): 40 min  Short Term Goals: Week 2:  PT Short Term Goal 1 (Week 2): Pt will perform sit to stand x 5 with symmetry S PT Short Term Goal 1 - Progress (Week 2): Progressing toward goal PT Short Term Goal 2 (Week 2): Pt will gait 50 feet controlled environement close S PT Short Term Goal 2 - Progress (Week 2): Progressing toward goal PT Short Term Goal 3 (Week 2): Pt will gait household environment 25 ft min A PT Short Term Goal 3 - Progress (Week 2): Progressing toward goal  Skilled Therapeutic Interventions/Progress Updates:   See below for details.   Therapy Documentation Precautions:  Precautions Precautions: Fall Precaution Comments: decreased gaze stabilization, proprioception, and sensation, abnormal increased tone, ataxia and left body inattention Restrictions Weight Bearing Restrictions: No Pain:  No pain Mobility:  Sit to stand with min@ to supervision. Locomotion :  Gait training initially with RW then with shopping cart holding onto posts to facilitate upright posture and increase speed of gait to improve coordination and translation of the L hip forward in terminal stance.  Pt with some increase in tone making it difficult to get a fluid step through.  90', overall total @+2 with shopping cart, one therapist to guide the cart while other therapist facilitated the LE, pt = 75%.     Other Treatments:   Sit to stands with cane and theraband, pt holding cane with theraband under feet while standing up and then reaching hands overhead.  Performing slowly to control muscle activation. Kinesiotaped L biceps to decrease tone and facilitate full extension. See FIM for current functional status  Therapy/Group: Individual Therapy  Waylan Boga 08/30/2012, 3:51 PM

## 2012-08-30 NOTE — Progress Notes (Signed)
Patient ID: [name], male   DOB: Jun 09, 1951, 61 y.o.   MRN: [id number] Subjective/Complaints: [name] is a 61 y.o. RH-male with a history of TIA, diabetes mellitus, hyperlipidemia, recent laryngoplasty 9/16 who presented with acute recurrent weakness and numbness involving his left side with facial weakness on 08/14/12 had a TIA with similar presentation in May 2013. Workup was unremarkable and patient placed on plavix for CVA prophylaxis. CT head negative. 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 2D echo done revealing EF 50-55% with grade 1 diastolic dysfunction. Carotid dopplers without ICA stenosis. Patient had worsening of symptoms past admission. 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 FEES done (patient has had problems with aspiration) and placed on D2, thin liquids with hard swallow. He is developing left sided spasms. Marland Kitchen    tone in LUE and LLE better.  Burning pain in L hand is better Review of Systems  Constitutional: Positive for malaise/fatigue.  HENT:       Vocal hoarseness  Respiratory: Positive for cough. Negative for sputum production, shortness of breath and wheezing.   Gastrointestinal: Positive for nausea. Negative for vomiting.  All other systems reviewed and are negative.   Objective: Vital Signs: Blood pressure 137/89, pulse 73, temperature 97.9 F (36.6 C), temperature source Oral, resp. rate 19, height 5\' 11"  (1.803 m), weight 91 kg (200 lb 9.9 oz), SpO2 99.00%. No results found. Results for orders placed during the hospital encounter of 08/18/12 (from the past 72 hour(s))  GLUCOSE, CAPILLARY     Status: Normal   Collection Time   08/27/12 11:40 AM      Component Value Range Comment   Glucose-Capillary 92  70 - 99 mg/dL   GLUCOSE, CAPILLARY     Status: Abnormal   Collection Time   08/27/12  4:35 PM        Component Value Range Comment   Glucose-Capillary 147 (*) 70 - 99 mg/dL   GLUCOSE, CAPILLARY     Status: Normal   Collection Time   08/27/12  8:15 PM      Component Value Range Comment   Glucose-Capillary 71  70 - 99 mg/dL    Comment 1 Notify RN     GLUCOSE, CAPILLARY     Status: Abnormal   Collection Time   08/28/12  7:28 AM      Component Value Range Comment   Glucose-Capillary 118 (*) 70 - 99 mg/dL   GLUCOSE, CAPILLARY     Status: Abnormal   Collection Time   08/28/12 11:53 AM      Component Value Range Comment   Glucose-Capillary 174 (*) 70 - 99 mg/dL   GLUCOSE, CAPILLARY     Status: Abnormal   Collection Time   08/28/12  4:10 PM      Component Value Range Comment   Glucose-Capillary 120 (*) 70 - 99 mg/dL   GLUCOSE, CAPILLARY     Status: Abnormal   Collection Time   08/28/12  8:46 PM      Component Value Range Comment   Glucose-Capillary 135 (*) 70 - 99 mg/dL    Comment 1 Notify RN     GLUCOSE, CAPILLARY     Status: Abnormal   Collection Time   08/29/12  7:17 AM      Component Value Range Comment   Glucose-Capillary 115 (*) 70 - 99 mg/dL    Comment 1 Notify  RN     GLUCOSE, CAPILLARY     Status: Abnormal   Collection Time   08/29/12 12:13 PM      Component Value Range Comment   Glucose-Capillary 125 (*) 70 - 99 mg/dL    Comment 1 Notify RN     GLUCOSE, CAPILLARY     Status: Abnormal   Collection Time   08/29/12  4:21 PM      Component Value Range Comment   Glucose-Capillary 111 (*) 70 - 99 mg/dL   GLUCOSE, CAPILLARY     Status: Abnormal   Collection Time   08/29/12  8:50 PM      Component Value Range Comment   Glucose-Capillary 114 (*) 70 - 99 mg/dL   GLUCOSE, CAPILLARY     Status: Abnormal   Collection Time   08/30/12  7:38 AM      Component Value Range Comment   Glucose-Capillary 136 (*) 70 - 99 mg/dL    Comment 1 Notify RN        HEENT: horse voice Cardio: RRR Resp: CTA B/L GI: BS positive Extremity:  No Edema Skin:   Intact Neuro: Alert/Oriented,  Anxious, Abnormal Sensory reduced on left, Abnormal Motor 2 minus left deltoid bicep tricep grip as well as left hip flexor knee extensor 1 ankle dorsiflexor plantar flexor and Abnormal FMC Ataxic/ dec FMC and Tone  increased tone left abductors and extensors in the lower extremity Musc/Skel:  Other pain with resisted hip abduction. No tenderness over the lateral hip. No tenderness over the knee. Tenderness over the  medial hip adductors peripheral pulses normal, normal skin temperature  in the feet Ashworth 1 spasticity Assessment/Plan: 1. 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 Physiatrist is providing close team supervision and 24 hour management of active medical problems listed below. Physiatrist and rehab team continue to assess barriers to discharge/monitor patient progress toward functional and medical goals. FIM: FIM - Bathing Bathing Steps Patient Completed: Chest;Right Arm;Left Arm;Abdomen;Front perineal area;Buttocks;Right lower leg (including foot);Left upper leg;Right upper leg;Left lower leg (including foot) Bathing: 5: Supervision: Safety issues/verbal cues (uses grab bar when standing, HH shower and sits on seat)  FIM - Upper Body Dressing/Undressing Upper body dressing/undressing steps patient completed: Thread/unthread right sleeve of pullover shirt/dresss;Thread/unthread left sleeve of pullover shirt/dress;Put head through opening of pull over shirt/dress;Pull shirt over trunk Upper body dressing/undressing: 5: Set-up assist to: Obtain clothing/put away FIM - Lower Body Dressing/Undressing Lower body dressing/undressing steps patient completed: Thread/unthread right underwear leg;Thread/unthread left underwear leg;Pull underwear up/down;Thread/unthread right  pants leg;Thread/unthread left pants leg;Pull pants up/down;Don/Doff right shoe;Don/Doff left shoe Lower body dressing/undressing: 4: Steadying Assist  FIM - Toileting Toileting steps completed by patient: Adjust clothing prior to toileting;Performs perineal hygiene;Adjust clothing after toileting Toileting Assistive Devices: Grab bar or rail for support Toileting: 4: Steadying assist  FIM - Radio producer Devices: Elevated toilet seat;Grab bars;Walker Toilet Transfers: 3-To toilet/BSC: Mod A (lift or lower assist)  FIM - Control and instrumentation engineer Devices: Walker;Arm rests Bed/Chair Transfer: 4: Sit > Supine: Min A (steadying pt. > 75%/lift 1 leg);5: Supine > Sit: Supervision (verbal cues/safety issues);4: Bed > Chair or W/C: Min A (steadying Pt. > 75%);4: Chair or W/C > Bed: Min A (steadying Pt. > 75%)  FIM - Locomotion: Wheelchair Distance: 100 Locomotion: Wheelchair: 5:  Travels 150 ft or more: maneuvers on rugs and over door sills with supervision, cueing or coaxing FIM - Locomotion: Ambulation Locomotion: Ambulation Assistive Devices: Administrator Ambulation/Gait Assistance: 4: Min assist Locomotion: Ambulation: 2: Travels 50 - 149 ft with minimal assistance (Pt.>75%) (controlled environement)  Comprehension Comprehension Mode: Auditory Comprehension: 7-Follows complex conversation/direction: With no assist  Expression Expression Mode: Verbal Expression: 6-Expresses complex ideas: With extra time/assistive device  Social Interaction Social Interaction: 6-Interacts appropriately with others with medication or extra time (anti-anxiety, antidepressant).  Problem Solving Problem Solving Mode: Not assessed Problem Solving: 6-Solves complex problems: With extra time  Memory Memory Mode: Not assessed Memory: 5-Requires cues to use assistive device   Medical Problem List and Plan:  1. DVT Prophylaxis/Anticoagulation:  Pharmaceutical: Lovenox  2. Pain Management: tramadol, tylenol.     lyrica  effective  at current dose 3. Mood: egosupport by team. He is fairly motivated and seems to be in good spirits  4. Neuropsych: This patient is capable of making decisions on his/her own behalf.  5. HTN: Monitor with bid checks. Avoid hypotension for now. Continue atenolol, HCTZ and off  lisinopril.  6. Constipation (acute on chronic):this may be contribute to nausea. scheduled softener/laxative, suppository or enema if needed  7. Thrush: nystatin mouth wash  8. Hyperlipidemia: zocor 10mg  daily 9. 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 His swallowing improved postoperatively and has not noticeably worsened post stroke.Continues to have a cough which may be either a low-grade aspiration or side effect of his ACE inhibitor 10. Urinary hesitancy DC diphenhydramine and trazodone,monitor, avoid  Anticholinergic agents 11.  DM good control, pt concerned lantus may increase his CBG, we discussed this 12.  Spasticity improved on Zanaflex LOS (Days) 12 A FACE TO FACE EVALUATION WAS PERFORMED  KIRSTEINS,ANDREW E 08/30/2012, 10:05 AM

## 2012-08-30 NOTE — Progress Notes (Signed)
Social Work Patient ID: [name], male   DOB: 28-Jan-1951, 61 y.o.   MRN: [id number] Met with pt and wife who was here visiting to discuss team conference and progression toward goals, along with discharge 10/17. He wants to try to go home and see how he manages, he feels he is doing better.  Wife had questions regarding transportation and follow up. Discussed starting off with home health and transition to OP.  She feels he would do better with OP but would not guarantee transportation to. Pt wants to wait and see how he feels next week.  Will see how he does and team to try some mod/i trials, see how safe he is.  Continue to work On a safe discharge plan.

## 2012-08-30 NOTE — Patient Care Conference (Signed)
Inpatient RehabilitationTeam Conference Note Date: 08/30/2012   Time: 11:25 AM    Patient Name: [name]      Medical Record Number: [id number]  Date of Birth: 12-04-1950 Sex: Male         Room/Bed: 4145/4145-01 Payor Info: Payor: MEDICARE  Plan: MEDICARE PART A AND B  Product Type: *No Product type*     Admitting Diagnosis: RT CVA  Admit Date/Time:  08/18/2012  4:25 PM Admission Comments: No comment available   Primary Diagnosis:  CVA (cerebral infarction) Principal Problem: CVA (cerebral infarction)  Patient Active Problem List   Diagnosis Date Noted  . CVA (cerebral infarction) 08/14/2012  . Dysphagia following cerebrovascular accident 08/14/2012  . Left hemiplegia 08/14/2012  . Constipation   . Chronic back pain   . Peripheral neuropathy   . Dysphagia 07/13/2012  . TIA (transient ischemic attack) 04/08/2012  . Stroke 02/21/2012  . Preventative health care 04/15/2011  . [place], RIGHT 05/29/2010  . DIABETES MELLITUS, TYPE II, UNCONTROLLED 04/13/2010  . ALLERGIC RHINITIS 04/13/2010  . GERD 04/13/2010  . SHOULDER PAIN, RIGHT 04/13/2010  . Headache 04/13/2010  . TOBACCO USE, QUIT 04/13/2010  . ERECTILE DYSFUNCTION 03/12/2009  . HYPERTENSION, BENIGN 03/12/2009  . CAD, NATIVE VESSEL 03/12/2009  . HYPERLIPIDEMIA-MIXED 03/07/2009    Expected Discharge Date: Expected Discharge Date: 09/07/12  Team Members Present: Physician: Dr. Alysia Penna Social Worker Present: Ovidio Kin, LCSW Nurse Present: Other (comment) Pryor Montes Hicks-RN) PT Present: Raylene Everts, Renaye Rakers, PT OT Present: Sim Boast, Lorelee Cover, OT SLP Present: Gunnar Fusi, SLP Other (Discipline and Name): Jake Michaelis Coordinator     Current Status/Progress Goal Weekly Team Focus  Medical   nausea resolved, thalamic pain syndrome, spasticity  improve pain adn spasticity  adjust meds   Bowel/Bladder   Continent of bowel and bladder. Hx of constipation  Have BM  atleast q2days with medication if needed  Medication for bowel as ordered and PRN   Swallow/Nutrition/ Hydration   regular thin liquids with intermittent supervsion for use of safe swallow compensatory strategies   least restrictive p.o. Mod I   increase carryover   ADL's   Overall Min assist with BADL  Overall Supervision with BADL tasks  dynamic sitting and standing balance, forced use of LUE & LLE, postural control and proprioception, coordination and family education   Mobility   S w/c mobility; min A transfers, gait min-mod A with improving LLE control and RW, decreased postural control and inattention L body, improved activity tolerance  S, mod I w/c mobility and bed mobility  improve postural awareness and control for mobility   Communication   supervision   supervision   carryover of compensatory strategies   Safety/Cognition/ Behavioral Observations            Pain   ultram 50mg  q6hrs PRN, robaxin 500mg  q6hrs PRN, volteran gel as ordered  3 or less  assess and medicate as needed.    Skin   n/a            *See Interdisciplinary Assessment and Plan and progress notes for long and short-term goals  Barriers to Discharge: activity tolerance    Possible Resolutions to Barriers:  adjust meds    Discharge Planning/Teaching Needs:  Home with sister in-law and brother in-law, wife to help some-they are separated and live separatley.  Trying to come up with a safe discharge plan.      Team Discussion:  Nerve pain improved with lyrica.  Regular thin diet.  Question is if pt could reach mod/i level if stay extended.  Pt's goals supervision level but no one to provide this level of care.  Work on a safe discharge plan.  Revisions to Treatment Plan:  None   Continued Need for Acute Rehabilitation Level of Care: The patient requires daily medical management by a physician with specialized training in physical medicine and rehabilitation for the following conditions: Daily direction  of a multidisciplinary physical rehabilitation program to ensure safe treatment while eliciting the highest outcome that is of practical value to the patient.: Yes Daily medical management of patient stability for increased activity during participation in an intensive rehabilitation regime.: Yes Daily analysis of laboratory values and/or radiology reports with any subsequent need for medication adjustment of medical intervention for : Neurological problems;Other  [name], [name] 08/30/2012, 1:03 PM

## 2012-08-30 NOTE — Progress Notes (Addendum)
Occupational Therapy Session Notes  Patient Details  Name: [name] MRN: [id number] Date of Birth: 1951/01/19  Today's Date: 08/30/2012 Time: 0800-0900 and 200-245 Time Calculation (min): 60 min and 45 min  Short Term Goals: Week 2:  OT Short Term Goal 1 (Week 2): Patient will perform meal tray set up with Modified I using BUEs OT Short Term Goal 2 (Week 2): Patient will perform dynamic standing for LB bath and dressing with close supervision and no more than 3 cues for safe strategies. OT Short Term Goal 3 (Week 2): Patient will stand at commode to urinate with close supervision and use of grab bars for safety. OT Short Term Goal 4 (Week 2): Patient will stand at sink for shaving and brushing teeth with close supervision OT Short Term Goal 5 (Week 2): Patient will use LUE at a diminished level for all BADL & IADL tasks with supervision.  Skilled Therapeutic Interventions/Progress Updates:  1)  Self care retraining to include shower, bathe and groom.  Focused session on ambulate to and from bathroom with RW, stand at sink for groom, and several sit><stands.  Also focus on optimal left foot and hand placements as well as weight bearing/weight shifts onto LLE & LUE during all transitional movements and with dynamic standing & squatting tasks.  Patient also challenged today by picking up items off the floor while seated and while standing, wearing jeans, a belt, and tying tennis shoes for the first time!  Patient continues to require min-mod verbal and/or tactile cues related to safety due to deceased left body attention, increased tone, ataxia, and poor proprioception.  2)  Wife present upon arrival, reviewed plan for toilet transfer setup at home then practiced in hospital room with attempt to simulate home environment.  Patient ambulated with min assist to bathroom with RW and min-mod vcs for safety due to ataxia, left inattention and poor proprioception. Patient stood at toilet to  urinate with close supervision.  Performed w/c><stand to urinate and sit on commode with steady assist and min cues for safety.Also reviewed need for need for grab bars in shower with patient making final decisions regarding total number of grab bars needed in bathroom and where they will be best served.   Therapy Documentation Precautions:  Precautions Precautions: Fall Precaution Comments: decreased gaze stabilization, proprioception, and sensation, abnormal increased tone, ataxia and left body inattention Restrictions Weight Bearing Restrictions: No Pain:  Upon arrival patient reports "whole left side aching, tightness, burning".  7/10 rating, RN aware and medication provided.  Muscle rub applied by patient to left shoulder and left knee with supervision. ADL:  See FIM for current functional status  Therapy/Group: Individual Therapy  Vaden Becherer 08/30/2012, 9:15 AM

## 2012-08-30 NOTE — Progress Notes (Addendum)
Physical Therapy Session Note  Patient Details  Name: [name] MRN: [id number] Date of Birth: 23-Apr-1951  Today's Date: 08/30/2012 Time: 1005-1101 Time Calculation (min): 56 min  Short Term Goals: Week 2:  PT Short Term Goal 1 (Week 2): Pt will perform sit to stand x 5 with symmetry S PT Short Term Goal 1 - Progress (Week 2): Progressing toward goal PT Short Term Goal 2 (Week 2): Pt will gait 50 feet controlled environement close S PT Short Term Goal 2 - Progress (Week 2): Progressing toward goal PT Short Term Goal 3 (Week 2): Pt will gait household environment 25 ft min A PT Short Term Goal 3 - Progress (Week 2): Progressing toward goal  Skilled Therapeutic Interventions/Progress Updates:    This morning's session focused on gait with RW, WC mobility, repeated sit<->stands with eccentric control using left arm to push up, and neuro re-ed and balance training while preforming a functional task with unilateral and bil upper extremities.    Therapy Documentation Precautions:  Precautions Precautions: Fall Precaution Comments: decreased gaze stabilization, proprioception, and sensation, abnormal increased tone, ataxia and left body inattention Restrictions Weight Bearing Restrictions: No   Vital Signs: Therapy Vitals Pulse Rate: 73  BP: 137/89 mmHg Pain: Pain Assessment Pain Assessment: No/denies pain Pain Score: 0-No pain Pain Type: Acute pain Pain Location: Shoulder Pain Orientation: Left Pain Descriptors: Aching Pain Frequency: Intermittent Pain Onset: On-going Patients Stated Pain Goal: 2 Pain Intervention(s): Medication (See eMAR) Mobility: Transfers Sit to Stand: 4: Min assist;From chair/3-in-1;With armrests;With upper extremity assist Sit to Stand Details: Verbal cues for precautions/safety Stand to Sit: 4: Min assist;To chair/3-in-1;With upper extremity assist;With armrests Stand Pivot Transfers: 4: Min assist;With armrests Stand Pivot Transfer  Details: Verbal cues for precautions/safety Locomotion : Ambulation Ambulation: Yes Ambulation/Gait Assistance: 3: Mod assist Ambulation Distance (Feet): 100 Feet Assistive device: Rolling walker Ambulation/Gait Assistance Details: Manual facilitation for placement;Manual facilitation for weight shifting;Manual facilitation for weight bearing Ambulation/Gait Assistance Details: manual assist of left leg for placement and stabilization at the knee, manual assist to weight shift and properly rotate trunk and pelvis while stepping.  Verbal cues to decrease step length bil, increase upright posture and to not stay too close to the front of the RW.   Architect: Yes Wheelchair Assistance: 5: Investment banker, operational Details: Verbal cues for Information systems manager: Right upper extremity;Right lower extremity Wheelchair Parts Management: Needs assistance Distance: 150      Balance: Dynamic Standing Balance Dynamic Standing - Balance Support: Right upper extremity supported;Left upper extremity supported;Bilateral upper extremity supported;No upper extremity supported Dynamic Standing - Level of Assistance: 3: Mod assist Dynamic Standing - Balance Activities: Reaching for objects;Reaching across midline;Lateral lean/weight shifting;Forward lean/weight shifting Dynamic Standing - Comments: standing at parallel bars with therapist's manual facilitation to wieght shift left and stabilize at the left knee.  Verbal cues for upright posture, extended truk and hips.  Pt practiced stacking cups with one hand and then the other and then using both hands without holding onto the railing.  He also practiced rotating to reach the cups to his left side.    See FIM for current functional status  Therapy/Group: Individual Therapy  Wells Guiles B. Bryton Romagnoli, PT, DPT [phone]   08/30/2012, 11:54 AM

## 2012-08-31 ENCOUNTER — Inpatient Hospital Stay (HOSPITAL_COMMUNITY): Payer: Medicare Other | Admitting: Speech Pathology

## 2012-08-31 ENCOUNTER — Encounter (HOSPITAL_COMMUNITY): Payer: Medicare Other | Admitting: Occupational Therapy

## 2012-08-31 ENCOUNTER — Inpatient Hospital Stay (HOSPITAL_COMMUNITY): Payer: Medicare Other | Admitting: *Deleted

## 2012-08-31 ENCOUNTER — Inpatient Hospital Stay (HOSPITAL_COMMUNITY): Payer: Medicare Other | Admitting: Occupational Therapy

## 2012-08-31 LAB — GLUCOSE, CAPILLARY
Glucose-Capillary: 98 mg/dL (ref 70–99)
Glucose-Capillary: 93 mg/dL (ref 70–99)
Glucose-Capillary: 142 mg/dL — ABNORMAL HIGH (ref 70–99)
Glucose-Capillary: 76 mg/dL (ref 70–99)
Glucose-Capillary: 138 mg/dL — ABNORMAL HIGH (ref 70–99)
Glucose-Capillary: 104 mg/dL — ABNORMAL HIGH (ref 70–99)

## 2012-08-31 MED ORDER — SENNOSIDES-DOCUSATE SODIUM 8.6-50 MG PO TABS
2.0000 | ORAL_TABLET | Freq: Two times a day (BID) | ORAL | Status: DC
  Administered 2012-08-31 – 2012-09-07 (×15): 2 via ORAL
  Filled 2012-08-31 (×15): qty 2

## 2012-08-31 MED ORDER — PREGABALIN 75 MG PO CAPS
75.0000 mg | ORAL_CAPSULE | Freq: Two times a day (BID) | ORAL | Status: DC
  Administered 2012-08-31 – 2012-09-04 (×9): 75 mg via ORAL
  Filled 2012-08-31 (×4): qty 1
  Filled 2012-08-31 (×2): qty 3
  Filled 2012-08-31 (×3): qty 1

## 2012-08-31 NOTE — Progress Notes (Signed)
Occupational Therapy Session Note  Patient Details  Name: [name] MRN: [id number] Date of Birth: 07-04-1951  Today's Date: 08/31/2012 Time: 0810-0915 Time Calculation (min): 65 min   Skilled Therapeutic Interventions/Progress Updates: Patient seen for 1:1 OT session to address static to dynamic stand balance, and improve functional use of left upper and lower extremities during self care tasks.  Patient did an excellent job explaining his particular set up for ADL. He demonstrated very safe practice throughout.  He was able to utilize both verbal and tactile cues to change pattern of stepping when walking in room with rolling walker.  Patient beginning to grade force used to swing left leg through during walking.  Patient experiencing thalamic pain, as well as orthopedic pain from prior knee issues.  Patient reports no dizziness this session, and is enjoying reading again!     Therapy Documentation Precautions:  Precautions Precautions: Fall Precaution Comments: decreased gaze stabilization, proprioception, and sensation, abnormal increased tone, ataxia and left body inattention Restrictions Weight Bearing Restrictions: No  Pain: Left shoulder, lateral ribs, neck.  Treated with muscle rub.  See FIM for current functional status  Therapy/Group: Individual Therapy  Mariah Milling 08/31/2012, 5:57 PM

## 2012-08-31 NOTE — Progress Notes (Signed)
Occupational Therapy Session Note  Patient Details  Name: Keely Hulvey MRN: [id number] Date of Birth: 1951/04/01  Today's Date: 08/31/2012 Time: 1330-1405 Time Calculation (min): 35 min   Skilled Therapeutic Interventions/Progress Updates: Patient seen this pm for 1:1 OT session to address neuromuscular re-education to left upper extremity, trunk, and left lower extremity. Worked on pelvic mobility as needed for transition from sit to stand with weight through bilateral lower extremities, with emphasis on midline control and even left bias.  Weight bearing in sidelying to side sitting to engage left arm in more functional transitonal movements.         Therapy Documentation Precautions:  Precautions Precautions: Fall Precaution Comments: decreased gaze stabilization, proprioception, and sensation, abnormal increased tone, ataxia and left body inattention Restrictions Weight Bearing Restrictions: No  Pain: Pins and needles reported in left upper extremity.    See FIM for current functional status  Therapy/Group: Individual Therapy  Mariah Milling 08/31/2012, 6:05 PM

## 2012-08-31 NOTE — Progress Notes (Signed)
Physical Therapy Weekly Progress Note  Patient Details  Name: [name] MRN: [id number] Date of Birth: [birth year]  Today's Date: 08/31/2012 Time: 1000-1100 Time Calculation (min): 60 min  Patient has met 3 of 3 short term goals.    Patient continues to demonstrate the following deficits:impaired sensation L side, decreased postural awareness and control with abnormal compensations, ataxia and decreased coordination, impaired gaze stabilization, impaired but improving activity tolerance, L hemiplegia with decreased muscle endurance, decreased standing balance, intermittent L knee pain exacerbated by premorbid orthopedic issues and therefore will continue to benefit from skilled PT intervention to enhance overall performance with activity tolerance, balance, postural control, ability to compensate for deficits, functional use of  left upper extremity and left lower extremity, awareness and coordination.  Patient progressing toward long term goals..  Continue plan of care.  Discharge planning underway as pt lives with inlaws and is separated from wife.  He does not want family assisting with toileting and does not think they can provide 24 hr S.  Wife can assist intermittently but does not drive.  Have upgraded transfer goal to mod I with goal pt could be mod I w/c level, will continue to monitor and discuss with team.  PT Short Term Goals Week 3:  PT Short Term Goal 1 (Week 3): STG=LTG  Skilled Therapeutic Interventions/Progress Updates:    therapeutic activity- car transfer education stand pivot with RW x 2 close S; practiced/problem solved transfer to/from bed as pt states it is very high and feels he may have to step up onto a step, without this pt could perform S bed to w/c, min A stepping onto step with safety concerns/increased fall risk- to have family measure bed height; pt able to lift LLE into bed without using UE's with effort and time  NMR- to increase control ataxic LUE and  LLE in closed chain and open chain (9 steps with B rails, cues to attend to LUE min A overall)- pt stepped up and down first with LLE but improved placement when going down, improved step quality with gait and able to keep feet inside RW on tight turns, 60 ft close S controlled setting, min steady A 35 ft household, improved use of L side with transfers, symmetry with sit to stand  No c/o pain, HR 80 with activity Therapy Documentation Precautions:  Precautions Precautions: Fall Precaution Comments: decreased gaze stabilization, proprioception, and sensation, abnormal increased tone, ataxia and left body inattention Restrictions Weight Bearing Restrictions: No    See FIM for current functional status  Therapy/Group: Individual Therapy  Othelia Pulling 08/31/2012, 12:35 PM

## 2012-08-31 NOTE — Progress Notes (Signed)
Patient ID: [name], male   DOB: 12-Jul-1951, 61 y.o.   MRN: [id number] Subjective/Complaints: [name] is a 61 y.o. RH-male with a history of TIA, diabetes mellitus, hyperlipidemia, recent laryngoplasty 9/16 who presented with acute recurrent weakness and numbness involving his left side with facial weakness on 08/14/12 had a TIA with similar presentation in May 2013. Workup was unremarkable and patient placed on plavix for CVA prophylaxis. CT head negative. 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 2D echo done revealing EF 50-55% with grade 1 diastolic dysfunction. Carotid dopplers without ICA stenosis. Patient had worsening of symptoms past admission. 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 FEES done (patient has had problems with aspiration) and placed on D2, thin liquids with hard swallow. He is developing left sided spasms. Marland Kitchen    tone in LUE and LLE better.  Burning pain in L hand is worsening  Review of Systems  Constitutional: Positive for malaise/fatigue.  HENT:       Vocal hoarseness  Respiratory: Positive for cough. Negative for sputum production, shortness of breath and wheezing.   Gastrointestinal: Positive for nausea. Negative for vomiting.  All other systems reviewed and are negative.   Objective: Vital Signs: Blood pressure 134/81, pulse 74, temperature 98.2 F (36.8 C), temperature source Oral, resp. rate 20, height 5\' 11"  (1.803 m), weight 89.3 kg (196 lb 13.9 oz), SpO2 99.00%. No results found. Results for orders placed during the hospital encounter of 08/18/12 (from the past 72 hour(s))  GLUCOSE, CAPILLARY     Status: Abnormal   Collection Time   08/28/12 11:53 AM      Component Value Range Comment   Glucose-Capillary 174 (*) 70 - 99 mg/dL   GLUCOSE, CAPILLARY     Status: Abnormal   Collection Time   08/28/12  4:10 PM      Component Value Range Comment   Glucose-Capillary 120 (*) 70 - 99 mg/dL   GLUCOSE, CAPILLARY     Status: Abnormal   Collection Time   08/28/12  8:46 PM      Component Value Range Comment   Glucose-Capillary 135 (*) 70 - 99 mg/dL    Comment 1 Notify RN     GLUCOSE, CAPILLARY     Status: Abnormal   Collection Time   08/29/12  7:17 AM      Component Value Range Comment   Glucose-Capillary 115 (*) 70 - 99 mg/dL    Comment 1 Notify RN     GLUCOSE, CAPILLARY     Status: Abnormal   Collection Time   08/29/12 12:13 PM      Component Value Range Comment   Glucose-Capillary 125 (*) 70 - 99 mg/dL    Comment 1 Notify RN     GLUCOSE, CAPILLARY     Status: Abnormal   Collection Time   08/29/12  4:21 PM      Component Value Range Comment   Glucose-Capillary 111 (*) 70 - 99 mg/dL   GLUCOSE, CAPILLARY     Status: Abnormal   Collection Time   08/29/12  8:50 PM      Component Value Range Comment   Glucose-Capillary 114 (*) 70 - 99 mg/dL   GLUCOSE, CAPILLARY     Status: Abnormal   Collection Time   08/30/12  7:38 AM      Component Value Range Comment   Glucose-Capillary 136 (*) 70 -  99 mg/dL    Comment 1 Notify RN     GLUCOSE, CAPILLARY     Status: Abnormal   Collection Time   08/30/12 11:26 AM      Component Value Range Comment   Glucose-Capillary 101 (*) 70 - 99 mg/dL    Comment 1 Notify RN     GLUCOSE, CAPILLARY     Status: Normal   Collection Time   08/30/12  4:20 PM      Component Value Range Comment   Glucose-Capillary 98  70 - 99 mg/dL    Comment 1 Notify RN     GLUCOSE, CAPILLARY     Status: Normal   Collection Time   08/30/12  9:39 PM      Component Value Range Comment   Glucose-Capillary 93  70 - 99 mg/dL   GLUCOSE, CAPILLARY     Status: Abnormal   Collection Time   08/31/12  7:06 AM      Component Value Range Comment   Glucose-Capillary 142 (*) 70 - 99 mg/dL    Comment 1 Notify RN        HEENT: horse voice Cardio: RRR Resp: CTA B/L GI: BS  positive Extremity:  No Edema Skin:   Intact Neuro: Alert/Oriented, Anxious, Abnormal Sensory reduced on left, Abnormal Motor 2 minus left deltoid bicep tricep grip as well as left hip flexor knee extensor 1 ankle dorsiflexor plantar flexor and Abnormal FMC Ataxic/ dec FMC and Tone  increased tone left abductors and extensors in the lower extremity Musc/Skel:  Other pain with resisted hip abduction. No tenderness over the lateral hip. No tenderness over the knee. Tenderness over the  medial hip adductors peripheral pulses normal, normal skin temperature  in the feet [place] 1 spasticity Assessment/Plan: 1. 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 Physiatrist is providing close team supervision and 24 hour management of active medical problems listed below. Physiatrist and rehab team continue to assess barriers to discharge/monitor patient progress toward functional and medical goals. FIM: FIM - Bathing Bathing Steps Patient Completed: Chest;Right Arm;Left Arm;Abdomen;Front perineal area;Buttocks;Right lower leg (including foot);Left upper leg;Right upper leg;Left lower leg (including foot) Bathing: 5: Supervision: Safety issues/verbal cues (seated on tub bench, use of grab bar when stand)  FIM - Upper Body Dressing/Undressing Upper body dressing/undressing steps patient completed: Thread/unthread right sleeve of pullover shirt/dresss;Thread/unthread left sleeve of pullover shirt/dress;Put head through opening of pull over shirt/dress;Pull shirt over trunk Upper body dressing/undressing: 5: Set-up assist to: Obtain clothing/put away FIM - Lower Body Dressing/Undressing Lower body dressing/undressing steps patient completed: Thread/unthread right underwear leg;Thread/unthread  left underwear leg;Pull underwear up/down;Thread/unthread right pants leg;Thread/unthread left pants leg;Pull pants up/down;Don/Doff right shoe;Don/Doff left shoe;Don/Doff right sock;Don/Doff left sock;Fasten/unfasten pants (wore tie up shoes first time today) Lower body dressing/undressing: 4: Min-Patient completed 75 plus % of tasks  FIM - Toileting Toileting steps completed by patient: Adjust clothing prior to toileting;Performs perineal hygiene;Adjust clothing after toileting Toileting Assistive Devices: Grab bar or rail for support Toileting: 4: Steadying assist  FIM - Radio producer Devices: Elevated toilet seat;Grab bars Toilet Transfers: 4-To toilet/BSC: Min A (steadying Pt. > 75%);4-From toilet/BSC: Min A (steadying Pt. > 75%)  FIM - Control and instrumentation engineer Devices: Walker;Arm rests Bed/Chair Transfer: 4: Bed > Chair or W/C: Min A (steadying Pt. >  75%)  FIM - Locomotion: Wheelchair Distance: 150 Locomotion: Wheelchair: 5: Travels 150 ft or more: maneuvers on rugs and over door sills with supervision, cueing or coaxing FIM - Locomotion: Ambulation Locomotion: Ambulation Assistive Devices: Other (comment) (shopping cart) Ambulation/Gait Assistance: 1: +2 Total assist Locomotion: Ambulation: 1: Two helpers  Comprehension Comprehension Mode: Auditory Comprehension: 6-Follows complex conversation/direction: With extra time/assistive device  Expression Expression Mode: Verbal Expression: 6-Expresses complex ideas: With extra time/assistive device  Social Interaction Social Interaction: 6-Interacts appropriately with others with medication or extra time (anti-anxiety, antidepressant).  Problem Solving Problem Solving Mode: Not assessed Problem Solving: 6-Solves complex problems: With extra time  Memory Memory Mode: Not assessed Memory: 5-Requires cues to use assistive device   Medical Problem List and Plan:  1. DVT  Prophylaxis/Anticoagulation: Pharmaceutical: Lovenox  2. Pain Management: tramadol, tylenol.     lyrica  ineffective  at current dose, increase to 75mg         3. Mood: egosupport by team. He is fairly motivated and seems to be in good spirits  4. Neuropsych: This patient is capable of making decisions on his/her own behalf.  5. HTN: Monitor with bid checks. Avoid hypotension for now. Continue atenolol, HCTZ and off  lisinopril.  6. Constipation (acute on chronic):this may be contribute to nausea. scheduled softener/laxative, suppository or enema if needed  7. Thrush: nystatin mouth wash  8. Hyperlipidemia: zocor 10mg  daily 9. 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 His swallowing improved postoperatively and has not noticeably worsened post stroke.Continues to have a cough which may be either a low-grade aspiration or side effect of his ACE inhibitor 10. Urinary hesitancy DC diphenhydramine and trazodone,monitor, avoid  Anticholinergic agents 11.  DM good control, pt concerned lantus may increase his CBG, we discussed this 12.  Spasticity improved on Zanaflex LOS (Days) 13 A FACE TO FACE EVALUATION WAS PERFORMED  KIRSTEINS,ANDREW E 08/31/2012, 7:43 AM

## 2012-08-31 NOTE — Progress Notes (Signed)
Speech Language Pathology Daily Session Note & Discharge Summary  Patient Details  Name: [name] MRN: [id number] Date of Birth: 1951/07/19  Today's Date: 08/31/2012 Time: 0920-0942 Time Calculation (min): 22 min  Short Term Goals: Week 2: SLP Short Term Goal 1 (Week 2): Patient will implement speech strategies in conversation in a moderately noisy environment with supervision verbal cues.  SLP Short Term Goal 2 (Week 2): Patient will safely consume regular textures and thin liquids with modified independence for use swallow precautions.   Skilled Therapeutic Interventions:  Treatment session focused on addressing dysphagia goals.  SLP facilitated session with trials of thin liquids via straw with no overt s/s of aspiration during consumption of 4oz. SLP also facilitated session by reviewing vocal hygiene program for home to support best vocal fold function.  At times patient appears to speak in glottic fry; however, this is not a result of his CVA and all education has been completed.  Therefore, SLP recommends discharge from SLP services.     FIM:  Comprehension Comprehension Mode: Auditory Comprehension: 6-Follows complex conversation/direction: With extra time/assistive device Expression Expression Mode: Verbal Expression: 6-Expresses complex ideas: With extra time/assistive device Social Interaction Social Interaction: 6-Interacts appropriately with others with medication or extra time (anti-anxiety, antidepressant). Problem Solving Problem Solving: 6-Solves complex problems: With extra time Memory Memory: 5-Requires cues to use assistive device  Pain Pain Assessment Pain Assessment: No/denies pain  Therapy/Group: Individual Therapy   Speech Language Pathology Discharge Summary  Patient Details  Name: [name] MRN: [id number] Date of Birth: [birth year]  Today's Date: 08/31/2012  Patient has met 2 of 2 long term goals.  Patient to discharge at  overall Supervision level.  Reasons goals not met: n/a   Clinical Impression/Discharge Summary: Patient met 2 out of 2 long term goals and made fictional gains in swallow function and speech intelligibility.  Patient is now consuming regular textures and thin liquids with an intermittent throat clear as needed.  Patient has been educated on a Insurance underwriter program and has verbalized understanding of information.  Patient appears to speak mostly in glottic fry as a result his vocal dysfunction (post op larygoplasty) not his CVA.  As a result, it is recommended that patient follow up with ENT and SLP defers to ENT's recommendations for follow outpatient voice therapy.    Care Partner:  Caregiver Able to Provide Assistance: Other (comment) (no assist needed with dysphagia)     Recommendation:  Other (comment) (ENT)  Rationale for SLP Follow Up: Other (comment) (post laryngoplasty)   Equipment: one   Reasons for discharge: Treatment goals met   Patient/Family Agrees with Progress Made and Goals Achieved: Yes   See FIM for current functional status  Carmelia Roller., CCC-SLP [id number]  Onset 08/31/2012, 10:01 AM

## 2012-09-01 ENCOUNTER — Inpatient Hospital Stay (HOSPITAL_COMMUNITY): Payer: Medicare Other | Admitting: Occupational Therapy

## 2012-09-01 ENCOUNTER — Inpatient Hospital Stay (HOSPITAL_COMMUNITY): Payer: Medicare Other | Admitting: Physical Therapy

## 2012-09-01 ENCOUNTER — Inpatient Hospital Stay (HOSPITAL_COMMUNITY): Payer: Medicare Other | Admitting: *Deleted

## 2012-09-01 LAB — GLUCOSE, CAPILLARY
Glucose-Capillary: 130 mg/dL — ABNORMAL HIGH (ref 70–99)
Glucose-Capillary: 120 mg/dL — ABNORMAL HIGH (ref 70–99)
Glucose-Capillary: 148 mg/dL — ABNORMAL HIGH (ref 70–99)
Glucose-Capillary: 143 mg/dL — ABNORMAL HIGH (ref 70–99)

## 2012-09-01 MED ORDER — TRAMADOL HCL 50 MG PO TABS
50.0000 mg | ORAL_TABLET | Freq: Two times a day (BID) | ORAL | Status: DC
  Administered 2012-09-01 – 2012-09-07 (×12): 50 mg via ORAL
  Filled 2012-09-01 (×10): qty 1

## 2012-09-01 NOTE — Progress Notes (Signed)
Occupational Therapy Session Note  Patient Details  Name: [name] MRN: [id number] Date of Birth: 09-02-1951  Today's Date: 09/01/2012 Time: 1300-1400 Time Calculation (min): 60 min   Skilled Therapeutic Interventions/Progress Updates: Patient seen for 1:1 OT session this pm to address overactive trunk muscle activity with all transitional movements.  Worked specifically on lower trunk initiated movement, and initiating many transitions from lower trunk.  Patient with difficulty initially isolating this region for movement, but then able to transition sit to stand much more efficiently, more controlled and safer (less momentum) to rise from surface.  Dynamic sitting balance on physioball to emphasize motion.       Therapy Documentation Precautions:  Precautions Precautions: Fall Precaution Comments: decreased gaze stabilization, proprioception, and sensation, abnormal increased tone, ataxia and left body inattention Restrictions Weight Bearing Restrictions: No     Pain: Trunk (right side) over active   See FIM for current functional status  Therapy/Group: Individual Therapy  Mariah Milling 09/01/2012, 3:43 PM

## 2012-09-01 NOTE — Progress Notes (Signed)
Physical Therapy Session Note  Patient Details  Name: [name] MRN: [id number] Date of Birth: 1951-11-01  Today's Date: 09/01/2012 Time: 1115-1200 Time Calculation (min): 45 min  Short Term Goals: Week 3:  PT Short Term Goal 1 (Week 3): STG=LTG  Skilled Therapeutic Interventions/Progress Updates:    Screened vestibular function, pt now appropriate for close distance x1 VOR exercises with smaller "A". Pt to add these to further distance exercises and slowly wean from longer distance exercises.   Rest of session focused on neuromuscular reeducation for improved standing balance and gait mechanics. Ambulation x 150' with RW, min-guard progressing to min assist with fatigue. Cues for Lt pelvic rotation with Lt. LE advancement, decreased Rt. LE step length as pt becomes too unstable with too long of a step.   Lt. LE taps to cone in standing with bil. UE support working on modulated control of hip/knee flexion and foot placement. Ball roll forwards/backwards with Lt. foot in standing - up to min assist to decrease degrees of freedom allowing pt to work on modulated control of Lt. LE. Balloon punch to Lt. Side with PT to facilitate Lt. Knee and pelvic control.   Ambulation x 50' with up to min assist with cues for carrying over improved hip/knee flexion from session, cues for heel strike.   Therapy Documentation Precautions:  Precautions Precautions: Fall Precaution Comments: decreased gaze stabilization, proprioception, and sensation, abnormal increased tone, ataxia and left body inattention Restrictions Weight Bearing Restrictions: No Pain: Pain Assessment Pain Assessment: No/denies pain Pain Score: 0-No pain   See FIM for current functional status  Therapy/Group: Individual Therapy  Ladona Horns Cheek 09/01/2012, 12:12 PM

## 2012-09-01 NOTE — Progress Notes (Signed)
Patient ID: [name], male   DOB: 1951-10-15, 61 y.o.   MRN: [id number] Patient ID: [name], male   DOB: 1951/02/05, 61 y.o.   MRN: [id number] Subjective/Complaints: [name] is a 61 y.o. RH-male with a history of TIA, diabetes mellitus, hyperlipidemia, recent laryngoplasty 9/16 who presented with acute recurrent weakness and numbness involving his left side with facial weakness on 08/14/12 had a TIA with similar presentation in May 2013. Workup was unremarkable and patient placed on plavix for CVA prophylaxis. CT head negative. 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 2D echo done revealing EF 50-55% with grade 1 diastolic dysfunction. Carotid dopplers without ICA stenosis. Patient had worsening of symptoms past admission. 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 FEES done (patient has had problems with aspiration) and placed on D2, thin liquids with hard swallow. He is developing left sided spasms. Marland Kitchen    tone in LUE and LLE better.  Burning pain in L hand is worsening  Review of Systems  Constitutional: Positive for malaise/fatigue.  HENT:       Vocal hoarseness  Respiratory: Positive for cough. Negative for sputum production, shortness of breath and wheezing.   Gastrointestinal: Positive for nausea. Negative for vomiting.  All other systems reviewed and are negative.   Objective: Vital Signs: Blood pressure 136/71, pulse 78, temperature 98.2 F (36.8 C), temperature source Oral, resp. rate 18, height 5\' 11"  (1.803 m), weight 89.3 kg (196 lb 13.9 oz), SpO2 100.00%. No results found. Results for orders placed during the hospital encounter of 08/18/12 (from the past 72 hour(s))  GLUCOSE, CAPILLARY     Status: Abnormal   Collection Time   08/29/12 12:13 PM      Component Value Range Comment   Glucose-Capillary 125 (*)  70 - 99 mg/dL    Comment 1 Notify RN     GLUCOSE, CAPILLARY     Status: Abnormal   Collection Time   08/29/12  4:21 PM      Component Value Range Comment   Glucose-Capillary 111 (*) 70 - 99 mg/dL   GLUCOSE, CAPILLARY     Status: Abnormal   Collection Time   08/29/12  8:50 PM      Component Value Range Comment   Glucose-Capillary 114 (*) 70 - 99 mg/dL   GLUCOSE, CAPILLARY     Status: Abnormal   Collection Time   08/30/12  7:38 AM      Component Value Range Comment   Glucose-Capillary 136 (*) 70 - 99 mg/dL    Comment 1 Notify RN     GLUCOSE, CAPILLARY     Status: Abnormal   Collection Time   08/30/12 11:26 AM      Component Value Range Comment   Glucose-Capillary 101 (*) 70 - 99 mg/dL    Comment 1 Notify RN     GLUCOSE, CAPILLARY     Status: Normal   Collection Time   08/30/12  4:20 PM      Component Value Range Comment   Glucose-Capillary 98  70 - 99 mg/dL    Comment 1 Notify RN     GLUCOSE, CAPILLARY     Status: Normal   Collection Time   08/30/12  9:39 PM      Component Value Range Comment   Glucose-Capillary 93  70 - 99 mg/dL   GLUCOSE, CAPILLARY     Status: Abnormal  Collection Time   08/31/12  7:06 AM      Component Value Range Comment   Glucose-Capillary 142 (*) 70 - 99 mg/dL    Comment 1 Notify RN     GLUCOSE, CAPILLARY     Status: Normal   Collection Time   08/31/12 11:45 AM      Component Value Range Comment   Glucose-Capillary 76  70 - 99 mg/dL    Comment 1 Notify RN     GLUCOSE, CAPILLARY     Status: Abnormal   Collection Time   08/31/12  4:43 PM      Component Value Range Comment   Glucose-Capillary 138 (*) 70 - 99 mg/dL   GLUCOSE, CAPILLARY     Status: Abnormal   Collection Time   08/31/12  8:57 PM      Component Value Range Comment   Glucose-Capillary 104 (*) 70 - 99 mg/dL   GLUCOSE, CAPILLARY     Status: Abnormal   Collection Time   09/01/12  7:29 AM      Component Value Range Comment   Glucose-Capillary 130 (*) 70 - 99 mg/dL    Comment 1 Notify  RN        HEENT: horse voice Cardio: RRR Resp: CTA B/L GI: BS positive Extremity:  No Edema Skin:   Intact Neuro: Alert/Oriented, Anxious, Abnormal Sensory reduced on left, Abnormal Motor 2 minus left deltoid bicep tricep grip as well as left hip flexor knee extensor 1 ankle dorsiflexor plantar flexor and Abnormal FMC Ataxic/ dec FMC and Tone  increased tone left abductors and extensors in the lower extremity Musc/Skel:  Other pain with resisted hip abduction. No tenderness over the lateral hip. No tenderness over the knee. Tenderness over the  medial hip adductors peripheral pulses normal, normal skin temperature  in the feet Ashworth 1 spasticity Assessment/Plan: 1. 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 Physiatrist is providing close team supervision and 24 hour management of active medical problems listed below. Physiatrist and rehab team continue to assess barriers to discharge/monitor patient progress toward functional and medical goals. FIM: FIM - Bathing Bathing Steps Patient Completed: Chest;Right Arm;Left Arm;Abdomen;Front perineal area;Buttocks;Right upper leg;Left upper leg;Right lower leg (including foot);Left lower leg (including foot) Bathing: 5: Supervision: Safety issues/verbal cues  FIM - Upper Body Dressing/Undressing Upper body dressing/undressing steps patient completed: Thread/unthread right sleeve of pullover shirt/dresss;Thread/unthread left sleeve of pullover shirt/dress;Put head through opening of pull over shirt/dress;Pull shirt over trunk Upper body dressing/undressing: 5: Set-up assist to: Obtain clothing/put away FIM - Lower Body Dressing/Undressing Lower body dressing/undressing steps patient completed: Thread/unthread right underwear  leg;Thread/unthread left underwear leg;Pull underwear up/down;Thread/unthread right pants leg;Thread/unthread left pants leg;Pull pants up/down;Fasten/unfasten pants;Don/Doff right sock;Don/Doff left sock;Don/Doff right shoe;Don/Doff left shoe;Fasten/unfasten right shoe;Fasten/unfasten left shoe Lower body dressing/undressing: 4: Steadying Assist (emphasis on active left leg for balanced stand)  FIM - Toileting Toileting steps completed by patient: Adjust clothing prior to toileting;Performs perineal hygiene;Adjust clothing after toileting Toileting Assistive Devices: Grab bar or rail for support Toileting: 5: Supervision: Safety issues/verbal cues  FIM - Radio producer Devices: Environmental consultant;Bedside commode Toilet Transfers: 4-To toilet/BSC: Min A (steadying Pt. > 75%);4-From toilet/BSC: Min A (steadying Pt. > 75%)  FIM - Control and instrumentation engineer Devices: Walker;Arm rests;Bed rails Bed/Chair Transfer: 4: Chair or W/C >  Bed: Min A (steadying Pt. > 75%);4: Bed > Chair or W/C: Min A (steadying Pt. > 75%);5: Supine > Sit: Supervision (verbal cues/safety issues);5: Sit > Supine: Supervision (verbal cues/safety issues)  FIM - Locomotion: Wheelchair Distance: 150 Locomotion: Wheelchair: 5: Travels 150 ft or more: maneuvers on rugs and over door sills with supervision, cueing or coaxing (mod I controlled environement 160 ft) FIM - Locomotion: Ambulation Locomotion: Ambulation Assistive Devices: Administrator Ambulation/Gait Assistance: 1: +2 Total assist Locomotion: Ambulation: 2: Travels 50 - 149 ft with minimal assistance (Pt.>75%)  Comprehension Comprehension Mode: Auditory Comprehension: 7-Follows complex conversation/direction: With no assist  Expression Expression Mode: Verbal Expression: 7-Expresses complex ideas: With no assist  Social Interaction Social Interaction: 7-Interacts appropriately with others - No medications  needed.  Problem Solving Problem Solving Mode: Not assessed Problem Solving: 6-Solves complex problems: With extra time  Memory Memory Mode: Not assessed Memory: 7-Complete Independence: No helper   Medical Problem List and Plan:  1. DVT Prophylaxis/Anticoagulation: Pharmaceutical: Lovenox  2. Pain Management: tramadol, tylenol.     lyrica  ineffective  at current dose, increase to 75mg         3. Mood: egosupport by team. He is fairly motivated and seems to be in good spirits  4. Neuropsych: This patient is capable of making decisions on his/her own behalf.  5. HTN: Monitor with bid checks. Avoid hypotension for now. Continue atenolol, HCTZ and off  lisinopril.  6. Constipation (acute on chronic):this may be contribute to nausea. scheduled softener/laxative, suppository or enema if needed  7. Thrush: nystatin mouth wash  8. Hyperlipidemia: zocor 10mg  daily 9. 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 His swallowing improved postoperatively and has not noticeably worsened post stroke.Continues to have a cough which may be either a low-grade aspiration or side effect of his ACE inhibitor 10. Urinary hesitancy DC diphenhydramine and trazodone,monitor, avoid  Anticholinergic agents 11.  DM good control, pt concerned lantus may increase his CBG, we discussed this 12.  Spasticity improved on Zanaflex LOS (Days) 14 A FACE TO FACE EVALUATION WAS PERFORMED  KIRSTEINS,ANDREW E 09/01/2012, 8:23 AM

## 2012-09-01 NOTE — Progress Notes (Signed)
Patient ID: [name], male   DOB: August 16, 1951, 61 y.o.   MRN: [id number] Subjective/Complaints: [name] is a 61 y.o. RH-male with a history of TIA, diabetes mellitus, hyperlipidemia, recent laryngoplasty 9/16 who presented with acute recurrent weakness and numbness involving his left side with facial weakness on 08/14/12 had a TIA with similar presentation in May 2013. Workup was unremarkable and patient placed on plavix for CVA prophylaxis. CT head negative. 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 2D echo done revealing EF 50-55% with grade 1 diastolic dysfunction. Carotid dopplers without ICA stenosis. Patient had worsening of symptoms past admission. 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 FEES done (patient has had problems with aspiration) and placed on D2, thin liquids with hard swallow. He is developing left sided spasms. Marland Kitchen    tone in LUE and LLE better.  Burning pain in L hand is worsening  Review of Systems  Constitutional: Positive for malaise/fatigue.  HENT:       Vocal hoarseness  Respiratory: Positive for cough. Negative for sputum production, shortness of breath and wheezing.   Gastrointestinal: Positive for nausea. Negative for vomiting.  All other systems reviewed and are negative.   Objective: Vital Signs: Blood pressure 136/71, pulse 78, temperature 98.2 F (36.8 C), temperature source Oral, resp. rate 18, height 5\' 11"  (1.803 m), weight 89.3 kg (196 lb 13.9 oz), SpO2 100.00%. No results found. Results for orders placed during the hospital encounter of 08/18/12 (from the past 72 hour(s))  GLUCOSE, CAPILLARY     Status: Abnormal   Collection Time   08/29/12 12:13 PM      Component Value Range Comment   Glucose-Capillary 125 (*) 70 - 99 mg/dL    Comment 1 Notify RN     GLUCOSE, CAPILLARY     Status: Abnormal    Collection Time   08/29/12  4:21 PM      Component Value Range Comment   Glucose-Capillary 111 (*) 70 - 99 mg/dL   GLUCOSE, CAPILLARY     Status: Abnormal   Collection Time   08/29/12  8:50 PM      Component Value Range Comment   Glucose-Capillary 114 (*) 70 - 99 mg/dL   GLUCOSE, CAPILLARY     Status: Abnormal   Collection Time   08/30/12  7:38 AM      Component Value Range Comment   Glucose-Capillary 136 (*) 70 - 99 mg/dL    Comment 1 Notify RN     GLUCOSE, CAPILLARY     Status: Abnormal   Collection Time   08/30/12 11:26 AM      Component Value Range Comment   Glucose-Capillary 101 (*) 70 - 99 mg/dL    Comment 1 Notify RN     GLUCOSE, CAPILLARY     Status: Normal   Collection Time   08/30/12  4:20 PM      Component Value Range Comment   Glucose-Capillary 98  70 - 99 mg/dL    Comment 1 Notify RN     GLUCOSE, CAPILLARY     Status: Normal   Collection Time   08/30/12  9:39 PM      Component Value Range Comment   Glucose-Capillary 93  70 - 99 mg/dL   GLUCOSE, CAPILLARY     Status: Abnormal   Collection Time   08/31/12  7:06 AM      Component  Value Range Comment   Glucose-Capillary 142 (*) 70 - 99 mg/dL    Comment 1 Notify RN     GLUCOSE, CAPILLARY     Status: Normal   Collection Time   08/31/12 11:45 AM      Component Value Range Comment   Glucose-Capillary 76  70 - 99 mg/dL    Comment 1 Notify RN     GLUCOSE, CAPILLARY     Status: Abnormal   Collection Time   08/31/12  4:43 PM      Component Value Range Comment   Glucose-Capillary 138 (*) 70 - 99 mg/dL   GLUCOSE, CAPILLARY     Status: Abnormal   Collection Time   08/31/12  8:57 PM      Component Value Range Comment   Glucose-Capillary 104 (*) 70 - 99 mg/dL   GLUCOSE, CAPILLARY     Status: Abnormal   Collection Time   09/01/12  7:29 AM      Component Value Range Comment   Glucose-Capillary 130 (*) 70 - 99 mg/dL    Comment 1 Notify RN        HEENT: horse voice Cardio: RRR Resp: CTA B/L GI: BS  positive Extremity:  No Edema Skin:   Intact Neuro: Alert/Oriented, Anxious, Abnormal Sensory reduced on left, Abnormal Motor 2 minus left deltoid bicep tricep grip as well as left hip flexor knee extensor 1 ankle dorsiflexor plantar flexor and Abnormal FMC Ataxic/ dec FMC and Tone  increased tone left abductors and extensors in the lower extremity Musc/Skel:  Other pain with resisted hip abduction. No tenderness over the lateral hip. No tenderness over the knee. Tenderness over the  medial hip adductors peripheral pulses normal, normal skin temperature  in the feet Ashworth 1 spasticity Assessment/Plan: 1. 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 Physiatrist is providing close team supervision and 24 hour management of active medical problems listed below. Physiatrist and rehab team continue to assess barriers to discharge/monitor patient progress toward functional and medical goals. FIM: FIM - Bathing Bathing Steps Patient Completed: Chest;Right Arm;Left Arm;Abdomen;Front perineal area;Buttocks;Right upper leg;Left upper leg;Right lower leg (including foot);Left lower leg (including foot) Bathing: 5: Supervision: Safety issues/verbal cues  FIM - Upper Body Dressing/Undressing Upper body dressing/undressing steps patient completed: Thread/unthread right sleeve of pullover shirt/dresss;Thread/unthread left sleeve of pullover shirt/dress;Put head through opening of pull over shirt/dress;Pull shirt over trunk Upper body dressing/undressing: 5: Set-up assist to: Obtain clothing/put away FIM - Lower Body Dressing/Undressing Lower body dressing/undressing steps patient completed: Thread/unthread right underwear leg;Thread/unthread left underwear leg;Pull underwear  up/down;Thread/unthread right pants leg;Thread/unthread left pants leg;Pull pants up/down;Fasten/unfasten pants;Don/Doff right sock;Don/Doff left sock;Don/Doff right shoe;Don/Doff left shoe;Fasten/unfasten right shoe;Fasten/unfasten left shoe Lower body dressing/undressing: 4: Steadying Assist (emphasis on active left leg for balanced stand)  FIM - Toileting Toileting steps completed by patient: Adjust clothing prior to toileting;Performs perineal hygiene;Adjust clothing after toileting Toileting Assistive Devices: Grab bar or rail for support Toileting: 5: Supervision: Safety issues/verbal cues  FIM - Radio producer Devices: Environmental consultant;Bedside commode Toilet Transfers: 4-To toilet/BSC: Min A (steadying Pt. > 75%);4-From toilet/BSC: Min A (steadying Pt. > 75%)  FIM - Control and instrumentation engineer Devices: Walker;Arm rests;Bed rails Bed/Chair Transfer: 4: Chair or W/C > Bed: Min A (steadying Pt. > 75%);4: Bed > Chair or W/C: Min A (  steadying Pt. > 75%);5: Supine > Sit: Supervision (verbal cues/safety issues);5: Sit > Supine: Supervision (verbal cues/safety issues)  FIM - Locomotion: Wheelchair Distance: 150 Locomotion: Wheelchair: 5: Travels 150 ft or more: maneuvers on rugs and over door sills with supervision, cueing or coaxing (mod I controlled environement 160 ft) FIM - Locomotion: Ambulation Locomotion: Ambulation Assistive Devices: Administrator Ambulation/Gait Assistance: 1: +2 Total assist Locomotion: Ambulation: 2: Travels 50 - 149 ft with minimal assistance (Pt.>75%)  Comprehension Comprehension Mode: Auditory Comprehension: 7-Follows complex conversation/direction: With no assist  Expression Expression Mode: Verbal Expression: 7-Expresses complex ideas: With no assist  Social Interaction Social Interaction: 7-Interacts appropriately with others - No medications needed.  Problem Solving Problem Solving Mode: Not  assessed Problem Solving: 6-Solves complex problems: With extra time  Memory Memory Mode: Not assessed Memory: 7-Complete Independence: No helper   Medical Problem List and Plan:  1. DVT Prophylaxis/Anticoagulation: Pharmaceutical: Lovenox  2. Pain Management: continued to have limitations due to knee pain.  Will schedule ultram prior to therapies. Continue additional prn tramadol, tylenol.  Lyrica  ineffective  at current dose, increase to 75mg      3. Mood: egosupport by team. He is fairly motivated and seems to be in good spirits  4. Neuropsych: This patient is capable of making decisions on his/her own behalf.  5. HTN: Monitor with bid checks.  Continue atenolol, HCTZ and off  lisinopril.  6. Constipation (acute on chronic):this may be contribute to nausea. scheduled softener/laxative, suppository or enema if needed  7. Thrush: nystatin mouth wash  8. Hyperlipidemia: zocor 10mg  daily 9. 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 His swallowing improved postoperatively and has not noticeably worsened post stroke.Continues to have a cough which may be either a low-grade aspiration or side effect of his ACE inhibitor 10. Urinary hesitancy: DC diphenhydramine and trazodone,monitor, avoid  Anticholinergic agents 11.  DM good control, pt concerned lantus may increase his CBG, we discussed this 12.  Spasticity improved on Zanaflex 13. Dysphagia: resolved. Tolerating thins without signs of aspiration. Speech has signed off.   LOS (Days) 14 A FACE TO FACE EVALUATION WAS PERFORMED  Bary Leriche 09/01/2012, 8:46 AM

## 2012-09-01 NOTE — Progress Notes (Signed)
Occupational Therapy Session Note  Patient Details  Name: [name] MRN: [id number] Date of Birth: [birth year]  Today's Date: 09/01/2012 Time:  -   0800-0930  (90 mins.)    Short Term Goals: Week 1:  OT Short Term Goal 1 (Week 1): Pt will perform UB bathing with min assist for washing the right arm using the LUE. OT Short Term Goal 1 - Progress (Week 1): Met OT Short Term Goal 2 (Week 1): Pt will perfrom UB dressing with supervision unsupported sitting. OT Short Term Goal 2 - Progress (Week 1): Met OT Short Term Goal 3 (Week 1): Pt will perform LB dressing with min assist sit to stand to donn pants only. OT Short Term Goal 3 - Progress (Week 1): Met OT Short Term Goal 4 (Week 1): Pt will use the LUE as an active assist for selfcare and grooming tasks with no more than min instructional cueing and mod facilitation. OT Short Term Goal 4 - Progress (Week 1): Met Week 2:  OT Short Term Goal 1 (Week 2): Patient will perform meal tray set up with Modified I using BUEs OT Short Term Goal 2 (Week 2): Patient will perform dynamic standing for LB bath and dressing with close supervision and no more than 3 cues for safe strategies. OT Short Term Goal 3 (Week 2): Patient will stand at commode to urinate with close supervision and use of grab bars for safety. OT Short Term Goal 4 (Week 2): Patient will stand at sink for shaving and brushing teeth with close supervision OT Short Term Goal 5 (Week 2): Patient will use LUE at a diminished level for all BADL & IADL tasks with supervision.  Skilled Therapeutic Interventions/Progress Updates:    Pt. Ambulated to toilet and stood to urinate with supervision. Pt. Ambulated to shower.  OT prepared set up with towels, etc.  Pt. Verbalized organization/sequencing with routine.  Addressed balance, LUE active assist, and trunk control.  Pt. Needed assist with foot placement when standing.  Pt. Tied shoes with increased time and adaptive means.  Performed  myotherapy and tissue mobs to Left neck area with pt reporting increased relief.    Therapy Documentation Precautions:  Precautions Precautions: Fall Precaution Comments: decreased gaze stabilization, proprioception, and sensation, abnormal increased tone, ataxia and left body inattention Restrictions Weight Bearing Restrictions: No General:   Vital Signs:  Oxygen Therapy SpO2: 100 % O2 Device: None (Room air) Pain:  left neck upper scapula        See FIM for current functional status  Therapy/Group: Individual Therapy  Lisa Roca 09/01/2012, 8:36 AM

## 2012-09-02 ENCOUNTER — Inpatient Hospital Stay (HOSPITAL_COMMUNITY): Payer: Medicare Other | Admitting: *Deleted

## 2012-09-02 DIAGNOSIS — Z5189 Encounter for other specified aftercare: Secondary | ICD-10-CM

## 2012-09-02 DIAGNOSIS — G8110 Spastic hemiplegia affecting unspecified side: Secondary | ICD-10-CM

## 2012-09-02 DIAGNOSIS — I69991 Dysphagia following unspecified cerebrovascular disease: Secondary | ICD-10-CM

## 2012-09-02 DIAGNOSIS — I6330 Cerebral infarction due to thrombosis of unspecified cerebral artery: Secondary | ICD-10-CM

## 2012-09-02 LAB — GLUCOSE, CAPILLARY
Glucose-Capillary: 144 mg/dL — ABNORMAL HIGH (ref 70–99)
Glucose-Capillary: 134 mg/dL — ABNORMAL HIGH (ref 70–99)
Glucose-Capillary: 169 mg/dL — ABNORMAL HIGH (ref 70–99)
Glucose-Capillary: 117 mg/dL — ABNORMAL HIGH (ref 70–99)

## 2012-09-02 NOTE — Progress Notes (Signed)
Occupational Therapy Session Note  Patient Details  Name: [name] MRN: [id number] Date of Birth: Mar 14, 1951  Today's Date: 09/02/2012 Time:  - 0945-1050  65 min    Short Term Goals: Week 1:  OT Short Term Goal 1 (Week 1): Pt will perform UB bathing with min assist for washing the right arm using the LUE. OT Short Term Goal 1 - Progress (Week 1): Met OT Short Term Goal 2 (Week 1): Pt will perfrom UB dressing with supervision unsupported sitting. OT Short Term Goal 2 - Progress (Week 1): Met OT Short Term Goal 3 (Week 1): Pt will perform LB dressing with min assist sit to stand to donn pants only. OT Short Term Goal 3 - Progress (Week 1): Met OT Short Term Goal 4 (Week 1): Pt will use the LUE as an active assist for selfcare and grooming tasks with no more than min instructional cueing and mod facilitation. OT Short Term Goal 4 - Progress (Week 1): Met Week 2:  OT Short Term Goal 1 (Week 2): Patient will perform meal tray set up with Modified I using BUEs OT Short Term Goal 2 (Week 2): Patient will perform dynamic standing for LB bath and dressing with close supervision and no more than 3 cues for safe strategies. OT Short Term Goal 3 (Week 2): Patient will stand at commode to urinate with close supervision and use of grab bars for safety. OT Short Term Goal 4 (Week 2): Patient will stand at sink for shaving and brushing teeth with close supervision OT Short Term Goal 5 (Week 2): Patient will use LUE at a diminished level for all BADL & IADL tasks with supervision.  Skilled Therapeutic Interventions/Progress Updates:    Pt. Transferred to wc from bed with minimal assist and verbal cues for placement.  Pt. Gathered clothes at wc level and then ambulated to shower with RW.  Pt. Negotiated small areas with walker and minimal assist.  He performed clothes set up.  Utilized LUE as active assist during above tasks with minimal cues.  Required minimal cues for wc safety and minimal assist  with wc maneuvering in tight areas.  Addressed upright posture and alignment when ambulating.  Addressed pelvic initiated movements when reaching.    Therapy Documentation Precautions:  Precautions Precautions: Fall Precaution Comments: decreased gaze stabilization, proprioception, and sensation, abnormal increased tone, ataxia and left body inattention Restrictions Weight Bearing Restrictions: Yes     Pain:  Left ankle pain  (2/10)   See FIM for current functional status  Therapy/Group: Individual Therapy  Lisa Roca 09/02/2012, 10:15 AM

## 2012-09-02 NOTE — Progress Notes (Signed)
Patient ID: [name], male   DOB: 22-Apr-1951, 61 y.o.   MRN: [id number] Patient ID: [name], male   DOB: 09/30/1951, 61 y.o.   MRN: [id number] Subjective/Complaints: [name] is a 61 y.o. RH-male with a history of TIA, diabetes mellitus, hyperlipidemia, recent laryngoplasty 9/16 who presented with acute recurrent weakness and numbness involving his left side with facial weakness on 08/14/12 had a TIA with similar presentation in May 2013. Workup was unremarkable and patient placed on plavix for CVA prophylaxis. CT head negative. 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 2D echo done revealing EF 50-55% with grade 1 diastolic dysfunction. Carotid dopplers without ICA stenosis. Patient had worsening of symptoms past admission. 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 FEES done (patient has had problems with aspiration) and placed on D2, thin liquids with hard swallow. He is developing left sided spasms. .    complains of left ankle pain since yesterday  Review of Systems  Constitutional: Positive for malaise/fatigue.  HENT:       Vocal hoarseness  Respiratory: Positive for cough. Negative for sputum production, shortness of breath and wheezing.   Gastrointestinal: Positive for nausea. Negative for vomiting.  All other systems reviewed and are negative.   Objective: Vital Signs: Blood pressure 129/61, pulse 71, temperature 98 F (36.7 C), temperature source Oral, resp. rate 19, height 5\' 11"  (1.803 m), weight 89.3 kg (196 lb 13.9 oz), SpO2 100.00%. No results found. Results for orders placed during the hospital encounter of 08/18/12 (from the past 72 hour(s))  GLUCOSE, CAPILLARY     Status: Abnormal   Collection Time   08/30/12  7:38 AM      Component Value Range Comment   Glucose-Capillary 136 (*) 70 - 99 mg/dL    Comment 1 Notify RN     GLUCOSE, CAPILLARY     Status: Abnormal   Collection Time   08/30/12 11:26 AM      Component Value Range Comment   Glucose-Capillary 101 (*) 70 - 99 mg/dL    Comment 1 Notify RN     GLUCOSE, CAPILLARY     Status: Normal   Collection Time   08/30/12  4:20 PM      Component Value Range Comment   Glucose-Capillary 98  70 - 99 mg/dL    Comment 1 Notify RN     GLUCOSE, CAPILLARY     Status: Normal   Collection Time   08/30/12  9:39 PM      Component Value Range Comment   Glucose-Capillary 93  70 - 99 mg/dL   GLUCOSE, CAPILLARY     Status: Abnormal   Collection Time   08/31/12  7:06 AM      Component Value Range Comment   Glucose-Capillary 142 (*) 70 - 99 mg/dL    Comment 1 Notify RN     GLUCOSE, CAPILLARY     Status: Normal   Collection Time   08/31/12 11:45 AM      Component Value Range Comment   Glucose-Capillary 76  70 - 99 mg/dL    Comment 1 Notify RN     GLUCOSE, CAPILLARY     Status: Abnormal   Collection Time   08/31/12  4:43 PM      Component Value Range Comment   Glucose-Capillary 138 (*) 70 - 99 mg/dL   GLUCOSE, CAPILLARY     Status: Abnormal  Collection Time   08/31/12  8:57 PM      Component Value Range Comment   Glucose-Capillary 104 (*) 70 - 99 mg/dL   GLUCOSE, CAPILLARY     Status: Abnormal   Collection Time   09/01/12  7:29 AM      Component Value Range Comment   Glucose-Capillary 130 (*) 70 - 99 mg/dL    Comment 1 Notify RN     GLUCOSE, CAPILLARY     Status: Abnormal   Collection Time   09/01/12 11:27 AM      Component Value Range Comment   Glucose-Capillary 120 (*) 70 - 99 mg/dL    Comment 1 Notify RN     GLUCOSE, CAPILLARY     Status: Abnormal   Collection Time   09/01/12  4:21 PM      Component Value Range Comment   Glucose-Capillary 148 (*) 70 - 99 mg/dL   GLUCOSE, CAPILLARY     Status: Abnormal   Collection Time   09/01/12  8:47 PM      Component Value Range Comment   Glucose-Capillary 143 (*) 70 - 99 mg/dL       HEENT: horse voice Cardio: RRR Resp: CTA B/L GI: BS positive Extremity:  No Edema Skin:   Intact Neuro: Alert/Oriented, Anxious, Abnormal Sensory reduced on left, Abnormal Motor 2 minus left deltoid bicep tricep grip as well as left hip flexor knee extensor 1 ankle dorsiflexor plantar flexor and Abnormal FMC Ataxic/ dec FMC and Tone  increased tone left abductors and extensors in the lower extremity Musc/Skel:  Other pain with resisted hip abduction. No tenderness over the lateral hip. No tenderness over the knee. Tenderness over the  medial hip adductors, mild tenderness at lateral ankle with inversion, no swelling peripheral pulses normal, normal skin temperature  in the feet Ashworth 1 spasticity Assessment/Plan: 1. 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 Physiatrist is providing close team supervision and 24 hour management of active medical problems listed below. Physiatrist and rehab team continue to assess barriers to discharge/monitor patient progress toward functional and medical goals. FIM: FIM - Bathing Bathing Steps Patient Completed: Chest;Right Arm;Left Arm;Abdomen;Front perineal area;Buttocks;Right upper leg;Left upper leg;Right lower leg (including foot);Left lower leg (including foot) Bathing: 5: Supervision: Safety issues/verbal cues  FIM - Upper Body Dressing/Undressing Upper body dressing/undressing steps patient completed: Thread/unthread right sleeve of pullover shirt/dresss;Thread/unthread left sleeve of pullover shirt/dress;Put head through opening of pull over shirt/dress;Pull shirt over trunk Upper body dressing/undressing: 5: Set-up assist to: Obtain clothing/put away FIM - Lower Body Dressing/Undressing Lower body dressing/undressing steps  patient completed: Thread/unthread right underwear leg;Thread/unthread left underwear leg;Pull underwear up/down;Thread/unthread right pants leg;Thread/unthread left pants leg;Pull pants up/down;Fasten/unfasten pants;Don/Doff right sock;Don/Doff left sock;Don/Doff right shoe;Don/Doff left shoe;Fasten/unfasten right shoe;Fasten/unfasten left shoe Lower body dressing/undressing: 4: Steadying Assist  FIM - Toileting Toileting steps completed by patient: Adjust clothing prior to toileting;Performs perineal hygiene;Adjust clothing after toileting Toileting Assistive Devices: Grab bar or rail for support Toileting: 5: Supervision: Safety issues/verbal cues  FIM - Radio producer Devices: Mining engineer Transfers: 4-To toilet/BSC: Min A (steadying Pt. > 75%);4-From toilet/BSC: Min A (steadying Pt. > 75%)  FIM - Control and instrumentation engineer Devices: Walker;Arm rests;Bed rails Bed/Chair Transfer: 4: Chair or W/C > Bed: Min A (steadying Pt. > 75%);4: Bed >  Chair or W/C: Min A (steadying Pt. > 75%);5: Supine > Sit: Supervision (verbal cues/safety issues);5: Sit > Supine: Supervision (verbal cues/safety issues)  FIM - Locomotion: Wheelchair Distance: 150 Locomotion: Wheelchair: 5: Travels 150 ft or more: maneuvers on rugs and over door sills with supervision, cueing or coaxing FIM - Locomotion: Ambulation Locomotion: Ambulation Assistive Devices: Administrator Ambulation/Gait Assistance: 4: Min assist Locomotion: Ambulation: 4: Travels 150 ft or more with minimal assistance (Pt.>75%)  Comprehension Comprehension Mode: Auditory Comprehension: 6-Follows complex conversation/direction: With extra time/assistive device  Expression Expression Mode: Verbal Expression: 6-Expresses complex ideas: With extra time/assistive device  Social Interaction Social Interaction: 6-Interacts appropriately with others with medication or extra time  (anti-anxiety, antidepressant).  Problem Solving Problem Solving Mode: Not assessed Problem Solving: 6-Solves complex problems: With extra time  Memory Memory Mode: Not assessed Memory: 5-Recognizes or recalls 90% of the time/requires cueing < 10% of the time   Medical Problem List and Plan:  1. DVT Prophylaxis/Anticoagulation: Pharmaceutical: Lovenox  2. Pain Management: tramadol, tylenol.     lyrica  ineffective  at current dose, increase to 75mg   -add ice and voltaren gel for left ankle sprain 3. Mood: egosupport by team. He is fairly motivated and seems to be in good spirits  4. Neuropsych: This patient is capable of making decisions on his/her own behalf.  5. HTN: Monitor with bid checks. Avoid hypotension for now. Continue atenolol, HCTZ and off  lisinopril.  6. Constipation (acute on chronic):this may be contribute to nausea. scheduled softener/laxative, suppository or enema if needed  7. Thrush: nystatin mouth wash  8. Hyperlipidemia: zocor 10mg  daily 9. 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 His swallowing improved postoperatively and has not noticeably worsened post stroke.Continues to have a cough which may be either a low-grade aspiration or side effect of his ACE inhibitor 10. Urinary hesitancy DC diphenhydramine and trazodone,monitor, avoid  Anticholinergic agents 11.  DM good control, pt concerned lantus may increase his CBG, we discussed this 12.  Spasticity improved on Zanaflex LOS (Days) 15 A FACE TO FACE EVALUATION WAS PERFORMED  [name] 09/02/2012, 7:04 AM

## 2012-09-03 ENCOUNTER — Inpatient Hospital Stay (HOSPITAL_COMMUNITY): Payer: Medicare Other | Admitting: Physical Therapy

## 2012-09-03 LAB — GLUCOSE, CAPILLARY
Glucose-Capillary: 134 mg/dL — ABNORMAL HIGH (ref 70–99)
Glucose-Capillary: 177 mg/dL — ABNORMAL HIGH (ref 70–99)
Glucose-Capillary: 170 mg/dL — ABNORMAL HIGH (ref 70–99)
Glucose-Capillary: 139 mg/dL — ABNORMAL HIGH (ref 70–99)

## 2012-09-03 NOTE — Progress Notes (Addendum)
Physical Therapy Note  Patient Details  Name: [name] MRN: [id number] Date of Birth: Jun 25, 1951 Today's Date: 09/03/2012  1500-1555 (55 minutes) individual Pain: no complaint of pain Focus of treatment: Neuro re-ed LT LE; gait training with /without AD Treatment: Gait room to gym 120 feet with RW min assist with one seated rest break (fatigue LT LE); sit to supine (mat) SBA with difficulty LT LE; supine to side (left) to sit min assist; Neuro re-ed LT LE- lower trunk rotation (trunk stretches) using therapy ball in supine; bilateral hip flexion in supine using therapy ball; supine Lt hip abduction ; gait without AD 10 feet X 4 to increase weight bearing LT LE in stance ( mod assist for balance); gait sideways along mat without AD mod assist, with RW min assist.   [name],JIM 09/03/2012, 7:55 AM

## 2012-09-03 NOTE — Progress Notes (Signed)
Patient ID: [name], male   DOB: Apr 25, 1951, 61 y.o.   MRN: [id number] Patient ID: [name], male   DOB: [birth year], 61 y.o.   MRN: [id number] Subjective/Complaints: [name] is a 61 y.o. RH-male with a history of TIA, diabetes mellitus, hyperlipidemia, recent laryngoplasty 9/16 who presented with acute recurrent weakness and numbness involving his left side with facial weakness on 08/14/12 had a TIA with similar presentation in May 2013. Workup was unremarkable and patient placed on plavix for CVA prophylaxis. CT head negative. 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 2D echo done revealing EF 50-55% with grade 1 diastolic dysfunction. Carotid dopplers without ICA stenosis. Patient had worsening of symptoms past admission. 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 FEES done (patient has had problems with aspiration) and placed on D2, thin liquids with hard swallow. He is developing left sided spasms. .   No new issues, slept well  Review of Systems  Constitutional: Positive for malaise/fatigue.  HENT:       Respiratory: Positive for cough. Negative for sputum production, shortness of breath and wheezing.   Gastrointestinal: Positive for nausea. Negative for vomiting.  All other systems reviewed and are negative.   Objective: Vital Signs: Blood pressure 142/87, pulse 88, temperature 98.3 F (36.8 C), temperature source Oral, resp. rate 18, height 5\' 11"  (1.803 m), weight 89.3 kg (196 lb 13.9 oz), SpO2 100.00%. No results found. Results for orders placed during the hospital encounter of 08/18/12 (from the past 72 hour(s))  GLUCOSE, CAPILLARY     Status: Abnormal   Collection Time   08/31/12  7:06 AM      Component Value Range Comment   Glucose-Capillary 142 (*) 70 - 99 mg/dL    Comment 1 Notify RN     GLUCOSE,  CAPILLARY     Status: Normal   Collection Time   08/31/12 11:45 AM      Component Value Range Comment   Glucose-Capillary 76  70 - 99 mg/dL    Comment 1 Notify RN     GLUCOSE, CAPILLARY     Status: Abnormal   Collection Time   08/31/12  4:43 PM      Component Value Range Comment   Glucose-Capillary 138 (*) 70 - 99 mg/dL   GLUCOSE, CAPILLARY     Status: Abnormal   Collection Time   08/31/12  8:57 PM      Component Value Range Comment   Glucose-Capillary 104 (*) 70 - 99 mg/dL   GLUCOSE, CAPILLARY     Status: Abnormal   Collection Time   09/01/12  7:29 AM      Component Value Range Comment   Glucose-Capillary 130 (*) 70 - 99 mg/dL    Comment 1 Notify RN     GLUCOSE, CAPILLARY     Status: Abnormal   Collection Time   09/01/12 11:27 AM      Component Value Range Comment   Glucose-Capillary 120 (*) 70 - 99 mg/dL    Comment 1 Notify RN     GLUCOSE, CAPILLARY     Status: Abnormal   Collection Time   09/01/12  4:21 PM      Component Value Range Comment   Glucose-Capillary 148 (*) 70 - 99 mg/dL   GLUCOSE, CAPILLARY     Status: Abnormal   Collection Time   09/01/12  8:47 PM  Component Value Range Comment   Glucose-Capillary 143 (*) 70 - 99 mg/dL   GLUCOSE, CAPILLARY     Status: Abnormal   Collection Time   09/02/12  7:30 AM      Component Value Range Comment   Glucose-Capillary 144 (*) 70 - 99 mg/dL    Comment 1 Notify RN     GLUCOSE, CAPILLARY     Status: Abnormal   Collection Time   09/02/12 11:30 AM      Component Value Range Comment   Glucose-Capillary 134 (*) 70 - 99 mg/dL    Comment 1 Notify RN     GLUCOSE, CAPILLARY     Status: Abnormal   Collection Time   09/02/12  4:43 PM      Component Value Range Comment   Glucose-Capillary 169 (*) 70 - 99 mg/dL    Comment 1 Notify RN     GLUCOSE, CAPILLARY     Status: Abnormal   Collection Time   09/02/12  9:12 PM      Component Value Range Comment   Glucose-Capillary 117 (*) 70 - 99 mg/dL    Comment 1 Notify RN         HEENT: horse voice Cardio: RRR Resp: CTA B/L GI: BS positive Extremity:  No Edema Skin:   Intact Neuro: Alert/Oriented, Anxious, Abnormal Sensory reduced on left, Abnormal Motor 2 minus left deltoid bicep tricep grip as well as left hip flexor knee extensor 1 ankle dorsiflexor plantar flexor and Abnormal FMC Ataxic/ dec FMC and Tone  increased tone left abductors and extensors in the lower extremity Musc/Skel:  Other pain with resisted hip abduction. No tenderness over the lateral hip. No tenderness over the knee. Tenderness over the  medial hip adductors, mild tenderness at lateral ankle with inversion, no swelling peripheral pulses normal, normal skin temperature  in the feet [place] 1 spasticity Assessment/Plan: 1. 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 Physiatrist is providing close team supervision and 24 hour management of active medical problems listed below. Physiatrist and rehab team continue to assess barriers to discharge/monitor patient progress toward functional and medical goals. FIM: FIM - Bathing Bathing Steps Patient Completed: Chest;Right Arm;Left Arm;Abdomen;Front perineal area;Buttocks;Right upper leg;Left upper leg;Right lower leg (including foot);Left lower leg (including foot) Bathing: 5: Supervision: Safety issues/verbal cues  FIM - Upper Body Dressing/Undressing Upper body dressing/undressing steps patient completed: Thread/unthread right sleeve of pullover shirt/dresss;Thread/unthread left sleeve of pullover shirt/dress;Put head through opening of pull over shirt/dress;Pull shirt over trunk Upper body dressing/undressing: 5: Set-up assist to: Obtain clothing/put away FIM - Lower Body Dressing/Undressing Lower body dressing/undressing steps  patient completed: Thread/unthread right underwear leg;Thread/unthread left underwear leg;Pull underwear up/down;Thread/unthread right pants leg;Thread/unthread left pants leg;Pull pants up/down;Fasten/unfasten pants;Don/Doff right sock;Don/Doff left sock;Don/Doff right shoe;Don/Doff left shoe;Fasten/unfasten right shoe;Fasten/unfasten left shoe Lower body dressing/undressing: 4: Steadying Assist  FIM - Toileting Toileting steps completed by patient: Adjust clothing prior to toileting;Performs perineal hygiene;Adjust clothing after toileting Toileting Assistive Devices: Grab bar or rail for support Toileting: 5: Supervision: Safety issues/verbal cues  FIM - Radio producer Devices: Mining engineer Transfers: 4-To toilet/BSC: Min A (steadying Pt. > 75%);4-From toilet/BSC: Min A (steadying Pt. > 75%)  FIM - Bed/Chair Transfer Bed/Chair Transfer Assistive Devices: Bed rails Bed/Chair Transfer: 4: Bed > Chair or W/C: Min A (steadying  Pt. > 75%)  FIM - Locomotion: Wheelchair Distance: 150 Locomotion: Wheelchair: 5: Travels 150 ft or more: maneuvers on rugs and over door sills with supervision, cueing or coaxing FIM - Locomotion: Ambulation Locomotion: Ambulation Assistive Devices: Administrator Ambulation/Gait Assistance: 4: Min assist Locomotion: Ambulation: 4: Travels 150 ft or more with minimal assistance (Pt.>75%)  Comprehension Comprehension Mode: Auditory Comprehension: 6-Follows complex conversation/direction: With extra time/assistive device  Expression Expression Mode: Verbal Expression: 6-Expresses complex ideas: With extra time/assistive device  Social Interaction Social Interaction: 6-Interacts appropriately with others with medication or extra time (anti-anxiety, antidepressant).  Problem Solving Problem Solving Mode: Not assessed Problem Solving: 6-Solves complex problems: With extra time  Memory Memory Mode: Not  assessed Memory: 6-More than reasonable amt of time   Medical Problem List and Plan:  1. DVT Prophylaxis/Anticoagulation: Pharmaceutical: Lovenox  2. Pain Management: tramadol, tylenol.     lyrica  ineffective  at current dose, increase to 75mg   -added ice and voltaren gel for mild left ankle sprain 3. Mood: egosupport by team. He is fairly motivated and seems to be in good spirits  4. Neuropsych: This patient is capable of making decisions on his/her own behalf.  5. HTN: Monitor with bid checks. Avoid hypotension for now. Continue atenolol, HCTZ and off  lisinopril.  6. Constipation (acute on chronic):this may be contribute to nausea. scheduled softener/laxative, suppository or enema if needed  7. Thrush: nystatin mouth wash  8. Hyperlipidemia: zocor 10mg  daily 9. 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 His swallowing improved postoperatively and has not noticeably worsened post stroke.Continues to have a cough which may be either a low-grade aspiration or side effect of his ACE inhibitor 10. Urinary hesitancy DC diphenhydramine and trazodone,monitor, avoid  Anticholinergic agents 11.  DM good control, with current regimen 12.  Spasticity improved on Zanaflex LOS (Days) 16 A FACE TO FACE EVALUATION WAS PERFORMED  SWARTZ,ZACHARY T 09/03/2012, 6:37 AM

## 2012-09-04 ENCOUNTER — Inpatient Hospital Stay (HOSPITAL_COMMUNITY): Payer: Medicare Other | Admitting: Occupational Therapy

## 2012-09-04 ENCOUNTER — Inpatient Hospital Stay (HOSPITAL_COMMUNITY): Payer: Medicare Other | Admitting: *Deleted

## 2012-09-04 ENCOUNTER — Inpatient Hospital Stay (HOSPITAL_COMMUNITY): Payer: Medicare Other | Admitting: Physical Therapy

## 2012-09-04 LAB — GLUCOSE, CAPILLARY
Glucose-Capillary: 140 mg/dL — ABNORMAL HIGH (ref 70–99)
Glucose-Capillary: 114 mg/dL — ABNORMAL HIGH (ref 70–99)
Glucose-Capillary: 72 mg/dL (ref 70–99)
Glucose-Capillary: 124 mg/dL — ABNORMAL HIGH (ref 70–99)

## 2012-09-04 MED ORDER — INSULIN ASPART 100 UNIT/ML ~~LOC~~ SOLN
5.0000 [IU] | Freq: Three times a day (TID) | SUBCUTANEOUS | Status: DC
  Administered 2012-09-04 – 2012-09-07 (×9): 5 [IU] via SUBCUTANEOUS

## 2012-09-04 MED ORDER — INSULIN ASPART 100 UNIT/ML ~~LOC~~ SOLN: 5.0000 [IU] | Freq: Three times a day (TID) | SUBCUTANEOUS | Status: DC

## 2012-09-04 NOTE — Progress Notes (Signed)
Physical Therapy Session Note  Patient Details  Name: [name] MRN: [id number] Date of Birth: Aug 03, 1951  Today's Date: 09/04/2012 Time: [id number] Time Calculation (min): 32 min  Short Term Goals: Week 2:  PT Short Term Goal 1 (Week 2): Pt will perform sit to stand x 5 with symmetry S PT Short Term Goal 1 - Progress (Week 2): Progressing toward goal PT Short Term Goal 2 (Week 2): Pt will gait 50 feet controlled environement close S PT Short Term Goal 2 - Progress (Week 2): Progressing toward goal PT Short Term Goal 3 (Week 2): Pt will gait household environment 25 ft min A PT Short Term Goal 3 - Progress (Week 2): Progressing toward goal  Skilled Therapeutic Interventions/Progress Updates:    See below for details, focused on neuro-reed for gait.  Therapy Documentation Precautions:  Precautions Precautions: Fall Precaution Comments: decreased gaze stabilization, proprioception, and sensation, abnormal increased tone, ataxia and left body inattention Restrictions Weight Bearing Restrictions: No Pain:  No pain reported in ankle or anywhere. Mobility:  Sit to stand with close supervision.  Stand pivot with close supervision. Locomotion :  Bilateral HHA after gait training on treadmill to assess progression of stepping coordination over ground.  Total @+2, pt =75% x 50', decreased ataxia noted with gait over ground vs treadmill.  Pt's L knee buckling, ? If this was due to fatigue from treadmill training, as this has not happened before.    Other Treatments:   Neuro reed treadmill training for gait.  Pt starting at speed= .5 and progressing to .9 focus on pt being able to take equal step length and equal time spent bilaterally in each phase of gait.  Appeared that treadmill increased pt's ataxia, but pt was able to perform a few cycles of normal step length bilaterally and in a more coordinated fashion. See FIM for current functional status  Therapy/Group: Individual  Therapy  Waylan Boga 09/04/2012, 11:40 AM

## 2012-09-04 NOTE — Progress Notes (Signed)
Occupational Therapy Weekly Progress Note  Patient Details  Name: [name] MRN: [id number] Date of Birth: [birth year]  Today's Date: 09/04/2012 Time: [id number] Time Calculation (min): 60 min  Patient has met 4 of 5 short term goals.  Patient has had limited opportunities to work on shaving while standing at sink (STG# 3).  Will focus on this goal for remainder of hospital stay.  Patient continues to demonstrate the following deficits: non-dominant left hemiplegia with abnormal tone/hypertonicity with effort and c/o spasms on left side, unbalanced muscle activation as well as impaired timing and sequencing with movement, poor activity tolerance decreased gaze stabilization/vestibular issues with poor balance strategies, ataxia with impaired gross and find motor coordination, postural control, poor proprioception, impaired sensation, and premorbid left knee and lower back pain.  and therefore will continue to benefit from skilled OT intervention to enhance overall performance with BADL, iADL and Reduce care partner burden.  Patient progressing toward long term goals..  Plan of care revisions: Patient is progressing nicely toward LTGs therefore four LTGs have been upgraded to Mod I..  OT Short Term Goals Week 2:  OT Short Term Goal 1 (Week 2): Patient will perform meal tray set up with Modified I using BUEs OT Short Term Goal 1 - Progress (Week 2): Met OT Short Term Goal 2 (Week 2): Patient will perform dynamic standing for LB bath and dressing with close supervision and no more than 3 cues for safe strategies. OT Short Term Goal 2 - Progress (Week 2): Met OT Short Term Goal 3 (Week 2): Patient will stand at commode to urinate with close supervision and use of grab bars for safety. OT Short Term Goal 3 - Progress (Week 2): Met OT Short Term Goal 4 (Week 2): Patient will stand at sink for shaving and brushing teeth with close supervision OT Short Term Goal 4 - Progress (Week 2): Partly  met OT Short Term Goal 5 (Week 2): Patient will use LUE at a diminished level for all BADL & IADL tasks with supervision. OT Short Term Goal 5 - Progress (Week 2): Met Week 3: OT Short Term Goal 1 (Week 3): STG = LTG of overall Mod I - Supervision  Skilled Therapeutic Interventions/Progress Updates:  Self care retraining to include shower, dress and grooming.  Focused session on use of walker and w/c to gather clothes and transfers.  Four of patient's LTG have been upgraded in collaboration with patient.  These goals include patient performing bath, dressing, toileting and toilet transfers at a Mod I level from a w/c secondary to patient continues to require supervision - min assist when using walker to get into and out of the bathroom for shower and toilet transfers.  Therapy Documentation Precautions:  Precautions Precautions: Fall Precaution Comments: decreased gaze stabilization, proprioception, and sensation, abnormal increased tone, ataxia and left body inattention Restrictions Weight Bearing Restrictions: No Pain: Denies pain  See FIM for current functional status  Therapy/Group: Individual Therapy  Ladell Lea 09/04/2012, 10:15 AM

## 2012-09-04 NOTE — Progress Notes (Signed)
Occupational Therapy Session Note  Patient Details  Name: [name] MRN: [id number] Date of Birth: 1951-11-17  Today's Date: 09/04/2012 Time: 1300-1358 Time Calculation (min): 58 min   Skilled Therapeutic Interventions/Progress Updates:    Began by working on shaving in standing at the sink.  Pt able to use the LUE as an active assist for shaving for 20% of the activity with close supervision.  Needed mod instructional cueing to remember to keep the left knee in extension in standing.  At times would bend, and pt would report that he could feel it bending, but did not always selfcorrect.  Went to the gym after completing shaving.  Worked on neuromuscular re-education for the left side.  Had pt work on holding therapy ball with bilateral UEs and shoulders flexed between 50-80 degrees while initiating sit to stand.  Pt needing min facilitation for sit to stand and to maintain shoulder flexion on the left side.  Also used therapy board and having pt keep his hand on the board while therapist moved it into different planes including shoulder flexion, abduction.  Pt needs min facilitation to achieve elbow extension fully before leaning forward to maintain contact with the board using his trunk.  Finished by having pt pick up checkers and reach out and place them in the grid.  Positioned grid to incorporate shoulder movement and increased weightbearing over the LLE while reaching.  Pt with increased difficulty manipulating checker into grid secondary to ataxia.   Therapy Documentation Precautions:  Precautions Precautions: Fall Precaution Comments: decreased gaze stabilization, proprioception, and sensation, abnormal increased tone, ataxia and left body inattention Restrictions Weight Bearing Restrictions: No  Pain: Pain Assessment Pain Assessment: No/denies pain Pain Score:   6 Pain Type: Acute pain Pain Location: Shoulder Pain Orientation: Left Pain Descriptors: Aching Pain  Frequency: Intermittent Pain Onset: Gradual Patients Stated Pain Goal: 3 Pain Intervention(s): Medication (See eMAR) (schedule ultram) ADL: See FIM for current functional status  Therapy/Group: Individual Therapy  Shanita Kanan OTR/L 09/04/2012, 2:47 PM

## 2012-09-04 NOTE — Progress Notes (Signed)
Physical Therapy Session Note  Patient Details  Name: [name] MRN: [id number] Date of Birth: November 20, 1951  Today's Date: 09/04/2012 Time: 1400-1457 Time Calculation (min): 57 min  Short Term Goals: Week 3:  PT Short Term Goal 1 (Week 3): STG=LTG  Skilled Therapeutic Interventions/Progress Updates:    gait training- controlled and household environments, improved activity tolerance noted by increased distances 140 x 2- discussed importance of progressing visual and vestibular systems for safety with gait; curb training as pt has 1 step to enter home, 5 min steady A x 1 close S x 4 with increased time;stair training 14 steps and also practiced sidestepping with one rail- one episode of knee buckling when going up on R  Therapeutic acitvity- stand pivot transfers with cues for stepping with LLE S using UE's to push [p and control descent with stand to sit; attempted to climb into bed 36 inches high as his is at home but unable- pt verbalizes understanding he use other/lower bed at d/c- this bed is upstairs but pt has an elevator; bed mobility and floor transfers  Discussed need for family ed, pt thinks children will be the ones to attend and to contact them to schedule  Pain- no c/o pain but frequently rubs L knee and says he is using gel, denies any ankle pain and doesn't think he actually sprained or strained it  Therapy Documentation Precautions:  Precautions Precautions: Fall Precaution Comments: vestibular, gaze stabilization deficits Restrictions Weight Bearing Restrictions: No Mobility: Bed Mobility Supine to Sit: 6: Modified independent (Device/Increase time) Supine to Sit Details (indicate cue type and reason): increased time to bring LLE over and off bed but no cues or A Sitting - Scoot to Edge of Bed: 6: Modified independent (Device/Increase time) Sit to Supine: 5: Supervision Transfers Sit to Stand: 5: Supervision;With upper extremity assist Stand to Sit: 5:  Supervision;With armrests;With upper extremity assist Stand Pivot Transfers: 5: Supervision;With armrests (with RW) Stand Pivot Transfer Details:  (cues to move LLE and not pivot around it with RLE moving ) Locomotion : Ambulation Ambulation/Gait Assistance: 5: Supervision;4: Min guard Gait Gait Pattern: Trunk rotated posteriorly on left;Step-through pattern Gait velocity: increased swing time LLE, looks at feet d/t sensory deficits, deliberate steps, if RLE step too long can hyperextend L knee in stance High Level Ambulation High Level Ambulation: Side stepping;Other high level ambulation Side Stepping: min steady A with RW negotiating tight spaces High Level Ambulation - Other Comments: managing door with close S Stairs / Additional Locomotion Stairs: Yes Stairs Assistance: 5: Supervision Stairs Assistance Details (indicate cue type and reason):  decreased clearance L foot, increased time, increased effort for LLE placement, looks at feet, intermittent cues to bring LUE up on rail d/t inattention Stair Management Technique: Two rails;Step to pattern Number of Stairs: 14  Height of Stairs: 6  Wheelchair Mobility Wheelchair Assistance: 6: Modified independent (Device/Increase time)    Other Treatments: Treatments Therapeutic Activity: floor transfer S with cue to position LLE under him  See FIM for current functional status  Therapy/Group: Individual Therapy  Othelia Pulling 09/04/2012, 3:05 PM

## 2012-09-04 NOTE — Progress Notes (Signed)
Patient ID: [name], male   DOB: [birth year], 61 y.o.   MRN: [id number] Subjective/Complaints: [name] is a 61 y.o. RH-male with a history of TIA, diabetes mellitus, hyperlipidemia, recent laryngoplasty 9/16 who presented with acute recurrent weakness and numbness involving his left side with facial weakness on 08/14/12 had a TIA with similar presentation in May 2013. Workup was unremarkable and patient placed on plavix for CVA prophylaxis. CT head negative. 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 2D echo done revealing EF 50-55% with grade 1 diastolic dysfunction. Carotid dopplers without ICA stenosis. Patient had worsening of symptoms past admission. 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 FEES done (patient has had problems with aspiration) and placed on D2, thin liquids with hard swallow. He is developing left sided spasms. .   No pain c/os Review of Systems  Constitutional: Positive for malaise/fatigue.  HENT:       Respiratory: Positive for cough. Negative for sputum production, shortness of breath and wheezing.   Gastrointestinal: Positive for nausea. Negative for vomiting.  All other systems reviewed and are negative.   Objective: Vital Signs: Blood pressure 136/86, pulse 80, temperature 98.1 F (36.7 C), temperature source Oral, resp. rate 18, height 5\' 11"  (1.803 m), weight 89.3 kg (196 lb 13.9 oz), SpO2 100.00%. No results found. Results for orders placed during the hospital encounter of 08/18/12 (from the past 72 hour(s))  GLUCOSE, CAPILLARY     Status: Abnormal   Collection Time   09/01/12 11:27 AM      Component Value Range Comment   Glucose-Capillary 120 (*) 70 - 99 mg/dL    Comment 1 Notify RN     GLUCOSE, CAPILLARY     Status: Abnormal   Collection Time   09/01/12  4:21 PM      Component Value Range  Comment   Glucose-Capillary 148 (*) 70 - 99 mg/dL   GLUCOSE, CAPILLARY     Status: Abnormal   Collection Time   09/01/12  8:47 PM      Component Value Range Comment   Glucose-Capillary 143 (*) 70 - 99 mg/dL   GLUCOSE, CAPILLARY     Status: Abnormal   Collection Time   09/02/12  7:30 AM      Component Value Range Comment   Glucose-Capillary 144 (*) 70 - 99 mg/dL    Comment 1 Notify RN     GLUCOSE, CAPILLARY     Status: Abnormal   Collection Time   09/02/12 11:30 AM      Component Value Range Comment   Glucose-Capillary 134 (*) 70 - 99 mg/dL    Comment 1 Notify RN     GLUCOSE, CAPILLARY     Status: Abnormal   Collection Time   09/02/12  4:43 PM      Component Value Range Comment   Glucose-Capillary 169 (*) 70 - 99 mg/dL    Comment 1 Notify RN     GLUCOSE, CAPILLARY     Status: Abnormal   Collection Time   09/02/12  9:12 PM      Component Value Range Comment   Glucose-Capillary 117 (*) 70 - 99 mg/dL    Comment 1 Notify RN     GLUCOSE, CAPILLARY     Status: Abnormal   Collection Time   09/03/12  7:28 AM      Component Value Range Comment   Glucose-Capillary 134 (*)  70 - 99 mg/dL    Comment 1 Notify RN     GLUCOSE, CAPILLARY     Status: Abnormal   Collection Time   09/03/12 11:53 AM      Component Value Range Comment   Glucose-Capillary 177 (*) 70 - 99 mg/dL    Comment 1 Notify RN     GLUCOSE, CAPILLARY     Status: Abnormal   Collection Time   09/03/12  4:26 PM      Component Value Range Comment   Glucose-Capillary 170 (*) 70 - 99 mg/dL   GLUCOSE, CAPILLARY     Status: Abnormal   Collection Time   09/03/12  8:33 PM      Component Value Range Comment   Glucose-Capillary 139 (*) 70 - 99 mg/dL    Comment 1 Notify RN     GLUCOSE, CAPILLARY     Status: Abnormal   Collection Time   09/04/12  7:16 AM      Component Value Range Comment   Glucose-Capillary 140 (*) 70 - 99 mg/dL    Comment 1 Notify RN        HEENT: horse voice Cardio: RRR Resp: CTA B/L GI: BS  positive Extremity:  No Edema Skin:   Intact Neuro: Alert/Oriented, Anxious, Abnormal Sensory reduced on left but able to distinguish Lt touch, Abnormal Motor 3 minus left deltoid bicep tricep grip as well as left hip flexor knee extensor 3- ankle dorsiflexor plantar flexor and Abnormal FMC Ataxic/ dec FMC and Tone  increased tone left abductors and extensors in the lower extremity Musc/Skel:  Other pain with resisted hip abduction. No tenderness over the lateral hip. No tenderness over the knee. Tenderness over the  medial hip adductors, mild tenderness at lateral ankle with inversion, no swelling peripheral pulses normal, normal skin temperature  in the feet [place] 1 spasticity Assessment/Plan: 1. 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 Physiatrist is providing close team supervision and 24 hour management of active medical problems listed below. Physiatrist and rehab team continue to assess barriers to discharge/monitor patient progress toward functional and medical goals. FIM: FIM - Bathing Bathing Steps Patient Completed: Chest;Right Arm;Left Arm;Abdomen;Front perineal area;Buttocks;Right upper leg;Left upper leg;Right lower leg (including foot);Left lower leg (including foot) Bathing: 5: Supervision: Safety issues/verbal cues  FIM - Upper Body Dressing/Undressing Upper body dressing/undressing steps patient completed: Thread/unthread right sleeve of pullover shirt/dresss;Thread/unthread left sleeve of pullover shirt/dress;Put head through opening of pull over shirt/dress;Pull shirt over trunk Upper body dressing/undressing: 5: Set-up assist to: Obtain clothing/put away FIM - Lower Body Dressing/Undressing Lower body dressing/undressing steps patient completed:  Thread/unthread right underwear leg;Thread/unthread left underwear leg;Pull underwear up/down;Thread/unthread right pants leg;Thread/unthread left pants leg;Pull pants up/down;Fasten/unfasten pants;Don/Doff right sock;Don/Doff left sock;Don/Doff right shoe;Don/Doff left shoe;Fasten/unfasten right shoe;Fasten/unfasten left shoe Lower body dressing/undressing: 4: Steadying Assist  FIM - Toileting Toileting steps completed by patient: Adjust clothing prior to toileting;Performs perineal hygiene;Adjust clothing after toileting Toileting Assistive Devices: Grab bar or rail for support Toileting: 5: Supervision: Safety issues/verbal cues  FIM - Radio producer Devices: Mining engineer Transfers: 4-To toilet/BSC: Min A (steadying Pt. > 75%);4-From toilet/BSC: Min A (steadying Pt. > 75%)  FIM - Bed/Chair Transfer Bed/Chair Transfer Assistive Devices: Bed rails Bed/Chair Transfer: 4: Bed > Chair or W/C: Min A (steadying Pt. > 75%)  FIM - Locomotion: Wheelchair Distance: 150 Locomotion: Wheelchair: 5: Travels 150 ft or more: maneuvers on rugs and over door sills with supervision, cueing or coaxing FIM - Locomotion: Ambulation Locomotion: Ambulation Assistive Devices: Administrator Ambulation/Gait Assistance: 4: Min assist Locomotion: Ambulation: 4: Travels 150 ft or more with minimal assistance (Pt.>75%)  Comprehension Comprehension Mode: Auditory Comprehension: 6-Follows complex conversation/direction: With extra time/assistive device  Expression Expression Mode: Verbal Expression: 6-Expresses complex ideas: With extra time/assistive device  Social Interaction Social Interaction: 6-Interacts appropriately with others with medication or extra time (anti-anxiety, antidepressant).  Problem Solving Problem Solving Mode: Not assessed Problem Solving: 6-Solves complex problems: With extra time  Memory Memory Mode: Not assessed Memory: 6-More than  reasonable amt of time   Medical Problem List and Plan:  1. DVT Prophylaxis/Anticoagulation: Pharmaceutical: Lovenox  2. Pain Management: tramadol, tylenol.     lyrica  ineffective  at current dose, increase to 75mg   -added ice and voltaren gel for mild left ankle sprain 3. Mood: egosupport by team. He is fairly motivated and seems to be in good spirits  4. Neuropsych: This patient is capable of making decisions on his/her own behalf.  5. HTN: Monitor with bid checks. Avoid hypotension for now. Continue atenolol, HCTZ and off  lisinopril.  6. Constipation (acute on chronic):this may be contribute to nausea. scheduled softener/laxative, suppository or enema if needed  7. Thrush: nystatin mouth wash  8. Hyperlipidemia: zocor 10mg  daily 9. 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 His swallowing improved postoperatively and has not noticeably worsened post stroke.Continues to have a cough which may be either a low-grade aspiration or side effect of his ACE inhibitor 10. Urinary hesitancy DC diphenhydramine and trazodone,monitor, avoid  Anticholinergic agents 11.  DM good control, with current regimen 12.  Spasticity improved on Zanaflex LOS (Days) 17 A FACE TO FACE EVALUATION WAS PERFORMED  Deshayla Empson E 09/04/2012, 7:49 AM

## 2012-09-05 ENCOUNTER — Inpatient Hospital Stay (HOSPITAL_COMMUNITY): Payer: Medicare Other | Admitting: *Deleted

## 2012-09-05 ENCOUNTER — Inpatient Hospital Stay (HOSPITAL_COMMUNITY): Payer: Medicare Other | Admitting: Occupational Therapy

## 2012-09-05 DIAGNOSIS — G8110 Spastic hemiplegia affecting unspecified side: Secondary | ICD-10-CM

## 2012-09-05 DIAGNOSIS — Z5189 Encounter for other specified aftercare: Secondary | ICD-10-CM

## 2012-09-05 DIAGNOSIS — I69991 Dysphagia following unspecified cerebrovascular disease: Secondary | ICD-10-CM

## 2012-09-05 DIAGNOSIS — I6330 Cerebral infarction due to thrombosis of unspecified cerebral artery: Secondary | ICD-10-CM

## 2012-09-05 LAB — GLUCOSE, CAPILLARY
Glucose-Capillary: 146 mg/dL — ABNORMAL HIGH (ref 70–99)
Glucose-Capillary: 183 mg/dL — ABNORMAL HIGH (ref 70–99)
Glucose-Capillary: 164 mg/dL — ABNORMAL HIGH (ref 70–99)
Glucose-Capillary: 112 mg/dL — ABNORMAL HIGH (ref 70–99)

## 2012-09-05 NOTE — Evaluation (Signed)
Recreational Therapy Assessment and Plan  Patient Details  Name: [name] MRN: [id number] Date of Birth: 14-Apr-1951 Today's Date: 09/05/2012  Rehab Potential: Good ELOS: 2 weeks   Assessment Clinical Impression: Problem List:  Patient Active Problem List   Diagnosis   .  DIABETES MELLITUS, TYPE II, UNCONTROLLED   .  HYPERLIPIDEMIA-MIXED   .  ERECTILE DYSFUNCTION   .  MORTON'S NEUROMA, RIGHT   .  HYPERTENSION, BENIGN   .  CAD, NATIVE VESSEL   .  ALLERGIC RHINITIS   .  GERD   .  SHOULDER PAIN, RIGHT   .  Headache   .  TOBACCO USE, QUIT   .  Preventative health care   .  TIA (transient ischemic attack)   .  Dysphagia   .  Stroke   .  Constipation   .  Chronic back pain   .  Peripheral neuropathy   .  CVA (cerebral infarction)   .  Dysphagia following cerebrovascular accident   .  Left hemiplegia    Past Medical History:  Past Medical History   Diagnosis  Date   .  ALLERGIC RHINITIS    .  CAD, NATIVE VESSEL      BMS to OM1 2001, DES to BMS 2005   .  DIAB W/UNSPEC COMP TYPE II/UNSPEC TYPE UNCNTRL    .  ERECTILE DYSFUNCTION    .  HYPERLIPIDEMIA-MIXED      takes Lipitor daily   .  MORTON'S NEUROMA, RIGHT    .  SHOULDER PAIN, RIGHT    .  PONV (postoperative nausea and vomiting)    .  HYPERTENSION, BENIGN      takes Lisinopril and Atenolol daily   .  Coughing    .  Shortness of breath      lying/sitting/exertion   .  Headache      occasionally   .  Peripheral neuropathy    .  Arthritis    .  Joint pain    .  Joint swelling    .  Chronic back pain      herniated disc   .  GERD      takes Protonix daily   .  Constipation      takes Carafate four times day   .  Seasonal allergies      takes Allegra and Benadryl daily prn;uses Flonase daily   .  Diabetes mellitus      on insulin   .  Insomnia      doesn't require meds   .  TIA on medication  02/2012     takes Plavix daily;on hold for surgery;rt sided weakness    Past Surgical History:  Past  Surgical History   Procedure  Date   .  Stent  2001, 2004     coronary stents   .  Angioplasty    .  Left knee surgury      x 2   .  Dental surgery    .  Coronary angioplasty  2005     2 stents   .  Colonoscopy    .  Laryngoplasty  08/07/2012     Procedure: LARYNGOPLASTY; Surgeon: Mike Gala, MD; Location: [facility]; Service: ENT; Laterality: Left; Left Vocal Cord Medialyzation    Assessment & Plan  Clinical Impression: [name] is a 61 y.o. RH-male with a history of TIA, diabetes mellitus, hyperlipidemia, recent laryngoplasty 9/16 who presented with acute recurrent weakness and numbness involving his  left side with facial weakness on 08/14/12 had a TIA with similar presentation in May 2013. Workup was unremarkable and patient placed on plavix for CVA prophylaxis. CT head negative. 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 2D echo done revealing EF 50-55% with grade 1 diastolic dysfunction. Carotid dopplers without ICA stenosis. Patient had worsening of symptoms past admission. 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 FEES done (patient has had problems with aspiration) and placed on D2, thin liquids with hard swallow. He is developing left sided spasms. 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 Continues with left hemiparesis with dysphagia and working on pre-gait activities. Patient transferred to CIR on 08/18/2012 .   Patient presents with decreased activity tolerance, decreased functional mobility, decreased balance, left sided weakness, ataxia limiting pt's independence with leisure/community pursuits. Leisure History/Participation Premorbid leisure interest/current participation: [employer] store;Community - Travel (Comment);Community - Theater/Cinema Other Leisure Interests:  Television;Reading;Computer;Cooking/Baking Leisure Participation Style: Alone;With Family/Friends Awareness of Community Resources: Good-identify 3 post discharge leisure resources Psychosocial / Spiritual Patient agreeable to Pet Therapy: No Does patient have pets?: No Social interaction - Mood/Behavior: Cooperative Engineer, drilling for Education?: Yes Recreational Therapy Orientation Orientation -Reviewed with patient: Available activity resources Strengths/Weaknesses Patient Strengths/Abilities: Willingness to participate;Active premorbidly Patient weaknesses: Physical limitations  Plan Rec Therapy Plan Is patient appropriate for Therapeutic Recreation?: Yes Rehab Potential: Good Treatment times per week: Min 1 time per week >20 minutes Estimated Length of Stay: 2 weeks TR Treatment/Interventions: Adaptive equipment instruction;1:1 session;Community reintegration;Patient/family education;Therapeutic activities;Wheelchair propulsion/positioning  Recommendations for other services: None  Discharge Criteria: Patient will be discharged from TR if patient refuses treatment 3 consecutive times without medical reason.  If treatment goals not met, if there is a change in medical status, if patient makes no progress towards goals or if patient is discharged from hospital.  The above assessment, treatment plan, treatment alternatives and goals were discussed and mutually agreed upon: by patient  [name] 09/05/2012, 5:01 PM

## 2012-09-05 NOTE — Progress Notes (Signed)
Physical Therapy Session Note  Patient Details  Name: [name] MRN: [id number] Date of Birth: 20-Dec-1950  Today's Date: 09/05/2012 Time: 0830-0930 Time Calculation (min): 60 min  Short Term Goals: No short term goals set  Skilled Therapeutic Interventions/Progress Updates:    therapeutic activity- gait with head turns scanning environment to challenge vestibular and visual systems and reaching for items with L hand to increase its control, 150 ft x 2 close S without greater challenges but min A with fatigue and decreased stance time  on left with fatigue and increased knee pain- no increased LOB with dual task of reciting alphabet skipping every other letter; balance training and LLE forced use and control work kicking ball- one episode of L knee buckling in single leg stance, needed HHA to complete task and time and cues to reset to symmetrical stance  Therapy Documentation Precautions:  Precautions Precautions: Fall Precaution Comments: vestibular, gaze stabilization deficits Restrictions Weight Bearing Restrictions: No Pain: c/o L knee pain and swelling, nurse present and applied muscle rub, able to participate   Locomotion : Ambulation Ambulation/Gait Assistance: 4: Min assist    See FIM for current functional status  Therapy/Group: Individual Therapy  Othelia Pulling 09/05/2012, 9:36 AM

## 2012-09-05 NOTE — Progress Notes (Signed)
Social Work Patient ID: [name], male   DOB: [birth year], 61 y.o.   MRN: [id number] Met with pt to complete SCAT application and discuss discharge needs.  Will begin with home health therapies and transition to OP therapies once SCAT application approved so he will have transportation.  He is pleased with his progress Wife to be here in the am to go through therapies with pt in preparation for discharge Thursday. Will order DME and follow up therapies.  Gave pt SCAT application back once faxed so he would have it for his appt.  See how family education goes tomorrow.

## 2012-09-05 NOTE — Progress Notes (Signed)
Recreational Therapy Session Note  Patient Details  Name: [name] MRN: [id number] Date of Birth: 1951-06-10 Today's Date: 09/05/2012 Time:  1550-1630 Pain: no c/o Skilled Therapeutic Interventions/Progress Updates: Session focused on community reintegration education including identification and negotiation of obstacles, energy conservation techniques, accessing public restrooms, and general safety from w/c level.  Also discussed use of time post discharge and potential activities for participation.  Therapy/Group: Individual Therapy  Lonnie Reth 09/05/2012, 5:04 PM

## 2012-09-05 NOTE — Progress Notes (Signed)
Patient ID: [name], male   DOB: 02-06-1951, 61 y.o.   MRN: [id number] Subjective/Complaints: [name] is a 61 y.o. RH-male with a history of TIA, diabetes mellitus, hyperlipidemia, recent laryngoplasty 9/16 who presented with acute recurrent weakness and numbness involving his left side with facial weakness on 08/14/12 had a TIA with similar presentation in May 2013. Workup was unremarkable and patient placed on plavix for CVA prophylaxis. CT head negative. 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 2D echo done revealing EF 50-55% with grade 1 diastolic dysfunction. Carotid dopplers without ICA stenosis. Patient had worsening of symptoms past admission. 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 FEES done (patient has had problems with aspiration) and placed on D2, thin liquids with hard swallow. He is developing left sided spasms. .   I don't want to take lyrica Review of Systems  Constitutional: Positive for malaise/fatigue.  HENT:       Respiratory: Positive for cough. Negative for sputum production, shortness of breath and wheezing.   Gastrointestinal: Positive for nausea. Negative for vomiting.  All other systems reviewed and are negative.   Objective: Vital Signs: Blood pressure 106/69, pulse 72, temperature 98 F (36.7 C), temperature source Oral, resp. rate 18, height 5\' 11"  (1.803 m), weight 89.3 kg (196 lb 13.9 oz), SpO2 98.00%. No results found. Results for orders placed during the hospital encounter of 08/18/12 (from the past 72 hour(s))  GLUCOSE, CAPILLARY     Status: Abnormal   Collection Time   09/02/12 11:30 AM      Component Value Range Comment   Glucose-Capillary 134 (*) 70 - 99 mg/dL    Comment 1 Notify RN     GLUCOSE, CAPILLARY     Status: Abnormal   Collection Time   09/02/12  4:43 PM      Component  Value Range Comment   Glucose-Capillary 169 (*) 70 - 99 mg/dL    Comment 1 Notify RN     GLUCOSE, CAPILLARY     Status: Abnormal   Collection Time   09/02/12  9:12 PM      Component Value Range Comment   Glucose-Capillary 117 (*) 70 - 99 mg/dL    Comment 1 Notify RN     GLUCOSE, CAPILLARY     Status: Abnormal   Collection Time   09/03/12  7:28 AM      Component Value Range Comment   Glucose-Capillary 134 (*) 70 - 99 mg/dL    Comment 1 Notify RN     GLUCOSE, CAPILLARY     Status: Abnormal   Collection Time   09/03/12 11:53 AM      Component Value Range Comment   Glucose-Capillary 177 (*) 70 - 99 mg/dL    Comment 1 Notify RN     GLUCOSE, CAPILLARY     Status: Abnormal   Collection Time   09/03/12  4:26 PM      Component Value Range Comment   Glucose-Capillary 170 (*) 70 - 99 mg/dL   GLUCOSE, CAPILLARY     Status: Abnormal   Collection Time   09/03/12  8:33 PM      Component Value Range Comment   Glucose-Capillary 139 (*) 70 - 99 mg/dL    Comment 1 Notify RN     GLUCOSE, CAPILLARY     Status: Abnormal   Collection Time   09/04/12  7:16 AM  Component Value Range Comment   Glucose-Capillary 140 (*) 70 - 99 mg/dL    Comment 1 Notify RN     GLUCOSE, CAPILLARY     Status: Abnormal   Collection Time   09/04/12 11:41 AM      Component Value Range Comment   Glucose-Capillary 114 (*) 70 - 99 mg/dL    Comment 1 Notify RN     GLUCOSE, CAPILLARY     Status: Normal   Collection Time   09/04/12  4:09 PM      Component Value Range Comment   Glucose-Capillary 72  70 - 99 mg/dL   GLUCOSE, CAPILLARY     Status: Abnormal   Collection Time   09/04/12  9:15 PM      Component Value Range Comment   Glucose-Capillary 124 (*) 70 - 99 mg/dL   GLUCOSE, CAPILLARY     Status: Abnormal   Collection Time   09/05/12  7:08 AM      Component Value Range Comment   Glucose-Capillary 146 (*) 70 - 99 mg/dL    Comment 1 Notify RN        HEENT: horse voice Cardio: RRR Resp: CTA B/L GI: BS  positive Extremity:  No Edema Skin:   Intact Neuro: Alert/Oriented, Anxious, Abnormal Sensory reduced on left but able to distinguish Lt touch, Abnormal Motor 3 minus left deltoid bicep tricep grip as well as left hip flexor knee extensor 3- ankle dorsiflexor plantar flexor and Abnormal FMC Ataxic/ dec FMC and Tone  increased tone left abductors and extensors in the lower extremity Musc/Skel:  Other pain with resisted hip abduction. No tenderness over the lateral hip. No tenderness over the knee. Tenderness over the  medial hip adductors, mild tenderness at lateral ankle with inversion, no swelling peripheral pulses normal, normal skin temperature  in the feet [place] 1 spasticity Assessment/Plan: 1. 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 Physiatrist is providing close team supervision and 24 hour management of active medical problems listed below. Physiatrist and rehab team continue to assess barriers to discharge/monitor patient progress toward functional and medical goals. FIM: FIM - Bathing Bathing Steps Patient Completed: Chest;Right Arm;Left Arm;Abdomen;Front perineal area;Buttocks;Right upper leg;Left upper leg;Right lower leg (including foot);Left lower leg (including foot) Bathing: 5: Supervision: Safety issues/verbal cues  FIM - Upper Body Dressing/Undressing Upper body dressing/undressing steps patient completed: Thread/unthread right sleeve of pullover shirt/dresss;Thread/unthread left sleeve of pullover shirt/dress;Put head through opening of pull over shirt/dress;Pull shirt over trunk Upper body dressing/undressing: 5: Set-up assist to: Obtain clothing/put away FIM - Lower Body Dressing/Undressing Lower body dressing/undressing steps patient completed:  Thread/unthread right underwear leg;Thread/unthread left underwear leg;Pull underwear up/down;Thread/unthread right pants leg;Thread/unthread left pants leg;Pull pants up/down;Fasten/unfasten pants;Don/Doff right sock;Don/Doff left sock;Don/Doff right shoe;Don/Doff left shoe;Fasten/unfasten right shoe;Fasten/unfasten left shoe Lower body dressing/undressing: 4: Steadying Assist  FIM - Toileting Toileting steps completed by patient: Adjust clothing prior to toileting;Performs perineal hygiene;Adjust clothing after toileting Toileting Assistive Devices: Grab bar or rail for support Toileting: 5: Supervision: Safety issues/verbal cues  FIM - Radio producer Devices: Mining engineer Transfers: 4-To toilet/BSC: Min A (steadying Pt. > 75%);4-From toilet/BSC: Min A (steadying Pt. > 75%)  FIM - Bed/Chair Transfer Bed/Chair Transfer Assistive Devices: Bed rails Bed/Chair Transfer: 6: Supine > Sit: No assist;5: Sit > Supine: Supervision (verbal cues/safety  issues);5: Bed > Chair or W/C: Supervision (verbal cues/safety issues);5: Chair or W/C > Bed: Supervision (verbal cues/safety issues)  FIM - Locomotion: Wheelchair Distance: 150 Locomotion: Wheelchair: 6: Travels 150 ft or more, turns around, maneuvers to table, bed or toilet, negotiates 3% grade: maneuvers on rugs and over door sills independently FIM - Locomotion: Ambulation Locomotion: Ambulation Assistive Devices: Administrator Ambulation/Gait Assistance: 5: Supervision;4: Min guard Locomotion: Ambulation: 2: Travels 50 - 149 ft with minimal assistance (Pt.>75%) (controlled env. S, min steady sidestepping)  Comprehension Comprehension Mode: Auditory Comprehension: 6-Follows complex conversation/direction: With extra time/assistive device  Expression Expression Mode: Verbal Expression: 6-Expresses complex ideas: With extra time/assistive device  Social Interaction Social Interaction: 6-Interacts  appropriately with others with medication or extra time (anti-anxiety, antidepressant).  Problem Solving Problem Solving Mode: Not assessed Problem Solving: 6-Solves complex problems: With extra time  Memory Memory Mode: Not assessed Memory: 6-More than reasonable amt of time   Medical Problem List and Plan:  1. DVT Prophylaxis/Anticoagulation: Pharmaceutical: Lovenox  2. Pain Management: tramadol, tylenol.     lyrica  , increase to 75mg , helping pain but pt read about side effects and no longer wants to take it, np wean needed due to low dose and short treatment duration   3. Mood: egosupport by team. He is fairly motivated and seems to be in good spirits  4. Neuropsych: This patient is capable of making decisions on his/her own behalf.  5. HTN: Monitor with bid checks. Avoid hypotension for now. Continue atenolol, HCTZ and off  lisinopril.  6. Constipation (acute on chronic):this may be contribute to nausea. scheduled softener/laxative, suppository or enema if needed  7. Thrush: nystatin mouth wash  8. Hyperlipidemia: zocor 10mg  daily 9. 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 His swallowing improved postoperatively and has not noticeably worsened post stroke.Continues to have a cough which may be either a low-grade aspiration or side effect of his ACE inhibitor 10. Urinary hesitancy DC diphenhydramine and trazodone,monitor, avoid  Anticholinergic agents 11.  DM good control, with current regimen 12.  Spasticity improved on Zanaflex LOS (Days) 18 A FACE TO FACE EVALUATION WAS PERFORMED  Hasina Kreager E 09/05/2012, 7:58 AM

## 2012-09-05 NOTE — Progress Notes (Signed)
Occupational Therapy Session Note  Patient Details  Name: [name] MRN: [id number] Date of Birth: September 11, 1951  Today's Date: 09/05/2012 Time: 0730-0830 and 1135-1200 Time Calculation (min): 60 min and 25 min  Short Term Goals: Week 3:  OT Short Term Goal 1 (Week 3): STG = LTG of overall Mod I - Supervision  Skilled Therapeutic Interventions/Progress Updates:    1) Pt seen for ADL retraining with focus on stand pivot transfers at w/c level and bathing and dressing with increased independence and safety.  Pt finishing breakfast upon arrival seated EOB.  Stand pivot transfer to Rt from bed to w/c with light min/steady assist for safety and trunk position.  Pt engaged in w/c to tub bench in walk-in shower with close supervision with use of grab bars and min cues for LLE placement and advancement.  Pt demonstrated decreased standing balance with pulling up pants, requiring min assist.    2) 1:1 OT with focus on NM re-ed with [facility] and in-hand manipulation activities.  Engaged in [facility] activity in standing with focus on weight shifting through LLE in standing and attention to Lt knee when buckling, pt required close supervision in standing.  Increased time and effort with reaching with LUE with [facility] task with cups, clothespins, and pennies.  Pt demonstrated ability to translate pennies from table to palm of hand and back to finger tips without dropping any.  Use of resistive clothespins with focus on shoulder ROM greater than 90 degrees and tip to tip pinch and 3 jaw chuck.  Therapy Documentation Precautions:  Precautions Precautions: Fall Precaution Comments: vestibular, gaze stabilization deficits Restrictions Weight Bearing Restrictions: No General:   Vital Signs: Therapy Vitals Temp: 98 F (36.7 C) Temp src: Oral Pulse Rate: 72  Resp: 18  BP: 118/70 mmHg Patient Position, if appropriate: Standing Oxygen Therapy SpO2: 98 % O2 Device: None (Room air) Pain:  Pt with no c/o pain  this session.  See FIM for current functional status  Therapy/Group: Individual Therapy  Sim Boast 09/05/2012, 9:20 AM

## 2012-09-05 NOTE — Progress Notes (Signed)
Physical Therapy Session Note  Patient Details  Name: [name] MRN: [id number] Date of Birth: 11-May-1951  Today's Date: 09/05/2012 Time: 1010-1105 Time Calculation (min): 55 min  Short Term Goals: No short term goals set  Skilled Therapeutic Interventions/Progress Updates:    discussed stroke risk factors and prevention  Therapeutic activity- car transfer stand pivot S; practiced bed to w/c to bed transfers for goal of mod I w/c level when home alone- S, pt tends to want to stand up- discussed leaving RW by bed for transfers as an option; curb close S with RW; ER exercise in sidelying; w/c mobility in household environment modI  No c/o pain or dizziness  Therapy Documentation Precautions:  Precautions Precautions: Fall Precaution Comments: vestibular, gaze stabilization deficits Restrictions Weight Bearing Restrictions: No  See FIM for current functional status  Therapy/Group: Individual Therapy  Othelia Pulling 09/05/2012, 11:26 AM

## 2012-09-06 ENCOUNTER — Inpatient Hospital Stay (HOSPITAL_COMMUNITY): Payer: Medicare Other | Admitting: Physical Therapy

## 2012-09-06 ENCOUNTER — Inpatient Hospital Stay (HOSPITAL_COMMUNITY): Payer: Medicare Other | Admitting: Occupational Therapy

## 2012-09-06 LAB — BASIC METABOLIC PANEL
BUN: 16 mg/dL (ref 6–23)
CO2: 30 mEq/L (ref 19–32)
Calcium: 9.4 mg/dL (ref 8.4–10.5)
Chloride: 104 mEq/L (ref 96–112)
Creatinine, Ser: 1.24 mg/dL (ref 0.50–1.35)
GFR calc Af Amer: 70 mL/min — ABNORMAL LOW (ref >90)
GFR calc non Af Amer: 61 mL/min — ABNORMAL LOW (ref >90)
Glucose, Bld: 129 mg/dL — ABNORMAL HIGH (ref 70–99)
Potassium: 4 mEq/L (ref 3.5–5.1)
Sodium: 140 mEq/L (ref 135–145)

## 2012-09-06 LAB — CBC
HCT: 35.6 % — ABNORMAL LOW (ref 39.0–52.0)
Hemoglobin: 11.8 g/dL — ABNORMAL LOW (ref 13.0–17.0)
MCH: 29.9 pg (ref 26.0–34.0)
MCHC: 33.1 g/dL (ref 30.0–36.0)
MCV: 90.1 fL (ref 78.0–100.0)
Platelets: 186 10*3/uL (ref 150–400)
RBC: 3.95 MIL/uL — ABNORMAL LOW (ref 4.22–5.81)
RDW: 12.2 % (ref 11.5–15.5)
WBC: 6 10*3/uL (ref 4.0–10.5)

## 2012-09-06 LAB — GLUCOSE, CAPILLARY
Glucose-Capillary: 129 mg/dL — ABNORMAL HIGH (ref 70–99)
Glucose-Capillary: 122 mg/dL — ABNORMAL HIGH (ref 70–99)
Glucose-Capillary: 83 mg/dL (ref 70–99)
Glucose-Capillary: 137 mg/dL — ABNORMAL HIGH (ref 70–99)

## 2012-09-06 NOTE — Progress Notes (Signed)
Patient ID: [name], male   DOB: [birth year], 61 y.o.   MRN: [id number] Subjective/Complaints: [name] is a 61 y.o. RH-male with a history of TIA, diabetes mellitus, hyperlipidemia, recent laryngoplasty 9/16 who presented with acute recurrent weakness and numbness involving his left side with facial weakness on 08/14/12 had a TIA with similar presentation in May 2013. Workup was unremarkable and patient placed on plavix for CVA prophylaxis. CT head negative. 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 2D echo done revealing EF 50-55% with grade 1 diastolic dysfunction. Carotid dopplers without ICA stenosis. Patient had worsening of symptoms past admission. 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 FEES done (patient has had problems with aspiration) and placed on D2, thin liquids with hard swallow.  L side felt heavy last noc no tingling or pain Excited re D/C in am Review of Systems  Constitutional: Positive for malaise/fatigue.  HENT:       Respiratory: Positive for cough. Negative for sputum production, shortness of breath and wheezing.   Gastrointestinal: Positive for nausea. Negative for vomiting.  All other systems reviewed and are negative.   Objective: Vital Signs: Blood pressure 130/78, pulse 66, temperature 98 F (36.7 C), temperature source Oral, resp. rate 17, height 5\' 11"  (1.803 m), weight 89.3 kg (196 lb 13.9 oz), SpO2 97.00%. No results found. Results for orders placed during the hospital encounter of 08/18/12 (from the past 72 hour(s))  GLUCOSE, CAPILLARY     Status: Abnormal   Collection Time   09/03/12 11:53 AM      Component Value Range Comment   Glucose-Capillary 177 (*) 70 - 99 mg/dL    Comment 1 Notify RN     GLUCOSE, CAPILLARY     Status: Abnormal   Collection Time   09/03/12  4:26 PM      Component  Value Range Comment   Glucose-Capillary 170 (*) 70 - 99 mg/dL   GLUCOSE, CAPILLARY     Status: Abnormal   Collection Time   09/03/12  8:33 PM      Component Value Range Comment   Glucose-Capillary 139 (*) 70 - 99 mg/dL    Comment 1 Notify RN     GLUCOSE, CAPILLARY     Status: Abnormal   Collection Time   09/04/12  7:16 AM      Component Value Range Comment   Glucose-Capillary 140 (*) 70 - 99 mg/dL    Comment 1 Notify RN     GLUCOSE, CAPILLARY     Status: Abnormal   Collection Time   09/04/12 11:41 AM      Component Value Range Comment   Glucose-Capillary 114 (*) 70 - 99 mg/dL    Comment 1 Notify RN     GLUCOSE, CAPILLARY     Status: Normal   Collection Time   09/04/12  4:09 PM      Component Value Range Comment   Glucose-Capillary 72  70 - 99 mg/dL   GLUCOSE, CAPILLARY     Status: Abnormal   Collection Time   09/04/12  9:15 PM      Component Value Range Comment   Glucose-Capillary 124 (*) 70 - 99 mg/dL   GLUCOSE, CAPILLARY     Status: Abnormal   Collection Time   09/05/12  7:08 AM      Component Value Range Comment   Glucose-Capillary 146 (*) 70 - 99 mg/dL  Comment 1 Notify RN     GLUCOSE, CAPILLARY     Status: Abnormal   Collection Time   09/05/12 11:20 AM      Component Value Range Comment   Glucose-Capillary 183 (*) 70 - 99 mg/dL    Comment 1 Notify RN     GLUCOSE, CAPILLARY     Status: Abnormal   Collection Time   09/05/12  4:58 PM      Component Value Range Comment   Glucose-Capillary 164 (*) 70 - 99 mg/dL   GLUCOSE, CAPILLARY     Status: Abnormal   Collection Time   09/05/12  9:02 PM      Component Value Range Comment   Glucose-Capillary 112 (*) 70 - 99 mg/dL   BASIC METABOLIC PANEL     Status: Abnormal   Collection Time   09/06/12  4:50 AM      Component Value Range Comment   Sodium 140  135 - 145 mEq/L    Potassium 4.0  3.5 - 5.1 mEq/L    Chloride 104  96 - 112 mEq/L    CO2 30  19 - 32 mEq/L    Glucose, Bld 129 (*) 70 - 99 mg/dL    BUN 16  6 - 23  mg/dL    Creatinine, Ser 1.24  0.50 - 1.35 mg/dL    Calcium 9.4  8.4 - 10.5 mg/dL    GFR calc non Af Amer 61 (*) >90 mL/min    GFR calc Af Amer 70 (*) >90 mL/min   CBC     Status: Abnormal   Collection Time   09/06/12  4:50 AM      Component Value Range Comment   WBC 6.0  4.0 - 10.5 K/uL    RBC 3.95 (*) 4.22 - 5.81 MIL/uL    Hemoglobin 11.8 (*) 13.0 - 17.0 g/dL    HCT 35.6 (*) 39.0 - 52.0 %    MCV 90.1  78.0 - 100.0 fL    MCH 29.9  26.0 - 34.0 pg    MCHC 33.1  30.0 - 36.0 g/dL    RDW 12.2  11.5 - 15.5 %    Platelets 186  150 - 400 K/uL   GLUCOSE, CAPILLARY     Status: Abnormal   Collection Time   09/06/12  7:10 AM      Component Value Range Comment   Glucose-Capillary 129 (*) 70 - 99 mg/dL    Comment 1 Notify RN        HEENT: horse voice Cardio: RRR Resp: CTA B/L GI: BS positive Extremity:  No Edema Skin:   Intact Neuro: Alert/Oriented, Anxious, Abnormal Sensory reduced on left but able to distinguish Lt touch, Abnormal Motor 3 minus left deltoid bicep tricep grip as well as left hip flexor knee extensor 3- ankle dorsiflexor plantar flexor and Abnormal FMC Ataxic/ dec FMC and Tone  increased tone left abductors and extensors in the lower extremity Musc/Skel:  Other pain with resisted hip abduction. No tenderness over the lateral hip. No tenderness over the knee. Tenderness over the  medial hip adductors, mild tenderness at lateral ankle with inversion, no swelling peripheral pulses normal, normal skin temperature  in the feet [place] 1 spasticity Assessment/Plan: 1. 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 Physiatrist is providing close team supervision and  24 hour management of active medical problems listed below. Physiatrist and rehab team continue to  assess barriers to discharge/monitor patient progress toward functional and medical goals. FIM: FIM - Bathing Bathing Steps Patient Completed: Chest;Right Arm;Left Arm;Abdomen;Front perineal area;Buttocks;Right upper leg;Left upper leg;Right lower leg (including foot);Left lower leg (including foot) Bathing: 6: More than reasonable amount of time  FIM - Upper Body Dressing/Undressing Upper body dressing/undressing steps patient completed: Thread/unthread right sleeve of pullover shirt/dresss;Thread/unthread left sleeve of pullover shirt/dress;Put head through opening of pull over shirt/dress;Pull shirt over trunk Upper body dressing/undressing: 6: More than reasonable amount of time FIM - Lower Body Dressing/Undressing Lower body dressing/undressing steps patient completed: Thread/unthread right underwear leg;Thread/unthread left underwear leg;Pull underwear up/down;Thread/unthread right pants leg;Thread/unthread left pants leg;Pull pants up/down Lower body dressing/undressing: 4: Steadying Assist  FIM - Toileting Toileting steps completed by patient: Adjust clothing prior to toileting;Performs perineal hygiene;Adjust clothing after toileting Toileting Assistive Devices: Grab bar or rail for support Toileting: 5: Supervision: Safety issues/verbal cues  FIM - Radio producer Devices: Mining engineer Transfers: 4-To toilet/BSC: Min A (steadying Pt. > 75%);4-From toilet/BSC: Min A (steadying Pt. > 75%)  FIM - Bed/Chair Transfer Bed/Chair Transfer Assistive Devices: Bed rails Bed/Chair Transfer: 6: Supine > Sit: No assist;6: Sit > Supine: No assist;5: Bed > Chair or W/C: Supervision (verbal cues/safety issues);5: Chair or W/C > Bed: Supervision (verbal cues/safety issues)  FIM - Locomotion: Wheelchair Distance: 150 Locomotion: Wheelchair: 6: Travels 150 ft or more, turns around, maneuvers to table, bed or toilet, negotiates 3% grade: maneuvers on rugs and  over door sills independently FIM - Locomotion: Ambulation Locomotion: Ambulation Assistive Devices: Administrator Ambulation/Gait Assistance: 4: Min assist Locomotion: Ambulation: 4: Travels 150 ft or more with minimal assistance (Pt.>75%)  Comprehension Comprehension Mode: Auditory Comprehension: 6-Follows complex conversation/direction: With extra time/assistive device  Expression Expression Mode: Verbal Expression: 6-Expresses complex ideas: With extra time/assistive device  Social Interaction Social Interaction: 6-Interacts appropriately with others with medication or extra time (anti-anxiety, antidepressant).  Problem Solving Problem Solving Mode: Not assessed Problem Solving: 6-Solves complex problems: With extra time  Memory Memory Mode: Not assessed Memory: 6-More than reasonable amt of time   Medical Problem List and Plan:  1. DVT Prophylaxis/Anticoagulation: Pharmaceutical: Lovenox  2. Pain Management: tramadol, tylenol.     lyrica  , increase to 75mg , helping pain but pt read about side effects and no longer wants to take it, np wean needed due to low dose and short treatment duration   3. Mood: egosupport by team. He is fairly motivated  4. Neuropsych: This patient is capable of making decisions on his/her own behalf.  5. HTN: Monitor with bid checks. Avoid hypotension for now. Continue atenolol, HCTZ and off  lisinopril.  6. Constipation (acute on chronic):this may be contribute to nausea. scheduled softener/laxative, suppository or enema if needed  7. Thrush: nystatin mouth wash  8. Hyperlipidemia: zocor 10mg  daily 9. 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 His swallowing improved postoperatively and has not noticeably worsened post stroke.Continues to have a cough which may be either a low-grade aspiration or side effect of his ACE inhibitor 10. Urinary hesitancy DC  diphenhydramine and trazodone,monitor, avoid  Anticholinergic agents 11.  DM good control, with current regimen 12.  Spasticity improved on Zanaflex LOS (Days) 19 A FACE TO FACE EVALUATION WAS PERFORMED  Kaydence Menard E 09/06/2012, 7:37 AM

## 2012-09-06 NOTE — Progress Notes (Signed)
Physical Therapy Session Note  Patient Details  Name: [name] MRN: [id number] Date of Birth: 28-Jun-1951  Today's Date: 09/06/2012 Time: 1530-1540 Time Calculation (min): 10 min  Short Term Goals: Week 3:  PT Short Term Goal 1 (Week 3): STG=LTG  Skilled Therapeutic Interventions/Progress Updates:   PA is concerned about pt's hip hiking, effortful gait when fatigued, in order to clear L foot.  PA requested Toe Up strap.    Fitted pt with strap; instructed pt in donning it.  Gait training with RW x 50' with supervision, wearing L Toe Up strap.  Pt did not feel it changed gait significantly, but it may increase safety at home.       Therapy Documentation Precautions:  Precautions Precautions: Fall Precaution Comments: decreased gaze stabilization, proprioception, and sensation, abnormal increased tone, ataxia and left body inattention Restrictions Weight Bearing Restrictions: No   Pain: Pain Assessment Pain Assessment: No/denies pain Mobility: Bed Mobility Supine to Sit: 6: Modified independent (Device/Increase time) Sitting - Scoot to Edge of Bed: 6: Modified independent (Device/Increase time) Sit to Supine: 6: Modified independent (Device/Increase time) Transfers Sit to Stand: 6: Modified independent (Device/Increase time);From toilet;From bed;With armrests;With upper extremity assist Stand to Sit: 6: Modified independent (Device/Increase time);To bed;With armrests;To toilet Squat Pivot Transfers: 6: Modified independent (Device/Increase time);With armrests;With upper extremity assistance    See FIM for current functional status  Therapy/Group: Individual Therapy  [name] 09/06/2012, 3:55 PM

## 2012-09-06 NOTE — Progress Notes (Signed)
Recreational Therapy Discharge Summary Patient Details  Name: [name] MRN: [id number] Date of Birth: May 12, 1951 Today's Date: 09/06/2012  Long term goals set: 1  Long term goals met: 1  Comments on progress toward goals: Pt has made excellent progress during LOS and is being discharged home tomorrow at [facility] I wheelchair level.  TR sessions focused on community reintegration education.  Pt requires Min assist for w/c mobility on outdoor sloped surfaces, but is Mod I on level surfaces. Reasons for discharge: discharge from hospital   Patient/family agrees with progress made and goals achieved: Yes  [name] 09/06/2012, 5:11 PM

## 2012-09-06 NOTE — Progress Notes (Signed)
Recreational Therapy Session Note  Patient Details  Name: [name] MRN: [id number] Date of Birth: Apr 23, 1951 Today's Date: 09/06/2012 Time:  [id number] Pain: no c/o Skilled Therapeutic Interventions/Progress Updates:  Pt participated in community reintegration activity w/c level on hospital grounds including indoor and outdoor surfaces with mod I with the exception of supervision/min cues- min assist for w/c mobility up and down inclines on outdoor surfaces.  Pt negotiated obstacles encountered.  Reviewed safety concerns during community pursuits in which pt stated understanding.   Therapy/Group: Individual Therapy   Laszlo Ellerby 09/06/2012, 5:03 PM

## 2012-09-06 NOTE — Patient Care Conference (Signed)
Inpatient RehabilitationTeam Conference Note Date: 09/06/2012   Time: 11:20AM    Patient Name: [name]      Medical Record Number: [id number]  Date of Birth: [birth year] Sex: Male         Room/Bed: 4037/4037-01 Payor Info: Payor: MEDICARE  Plan: MEDICARE PART A AND B  Product Type: *No Product type*     Admitting Diagnosis: RT CVA  Admit Date/Time:  08/18/2012  4:25 PM Admission Comments: No comment available   Primary Diagnosis:  CVA (cerebral infarction) Principal Problem: CVA (cerebral infarction)  Patient Active Problem List   Diagnosis Date Noted  . CVA (cerebral infarction) 08/14/2012  . Dysphagia following cerebrovascular accident 08/14/2012  . Left hemiplegia 08/14/2012  . Constipation   . Chronic back pain   . Peripheral neuropathy   . Dysphagia 07/13/2012  . TIA (transient ischemic attack) 04/08/2012  . Stroke 02/21/2012  . Preventative health care 04/15/2011  . [place], RIGHT 05/29/2010  . DIABETES MELLITUS, TYPE II, UNCONTROLLED 04/13/2010  . ALLERGIC RHINITIS 04/13/2010  . GERD 04/13/2010  . SHOULDER PAIN, RIGHT 04/13/2010  . Headache 04/13/2010  . TOBACCO USE, QUIT 04/13/2010  . ERECTILE DYSFUNCTION 03/12/2009  . HYPERTENSION, BENIGN 03/12/2009  . CAD, NATIVE VESSEL 03/12/2009  . HYPERLIPIDEMIA-MIXED 03/07/2009    Expected Discharge Date: Expected Discharge Date: 09/07/12  Team Members Present: Physician: Dr. Alysia Penna Social Worker Present: Ovidio Kin, LCSW Nurse Present: Julianne Rice, RN PT Present: Raylene Everts, Renaye Rakers, PT OT Present: Antony Salmon, Ellen Henri, OT SLP Present: Gunnar Fusi, SLP Other (Discipline and Name): Jake Michaelis Coordinator     Current Status/Progress Goal Weekly Team Focus  Medical   chronic constipation, thalamic pain syndrome improved with lyrica but pt wishes to d/c  maintain stability for D/C in am  adjust meds to home regimen   Bowel/Bladder   Continent of bowel/bladder.   LBM 10/14.  Hx. of constipation  Remain continent of bowel/bladder; free of constipation  Monitor   Swallow/Nutrition/ Hydration             ADL's             Mobility   S-mod I w/c level, min A household gait- close S controlled gait 150 ft  mod I w/c level, S - min A gait  family ed, prepare for d/c   Communication             Safety/Cognition/ Behavioral Observations            Pain   Patient complains of Left neck, shoulder, arm, and knee pain; has ultram 50mg  q6h prn, robaxin 500mg  q6h prn, voltaren gel prn, muscle rubs prn.  2 or less   assess the effectiveness of pain meds   Skin   n/a            *See Interdisciplinary Assessment and Plan and progress notes for long and short-term goals  Barriers to Discharge: pt reluctant to take certain meds as prescribed    Possible Resolutions to Barriers:  pt ed    Discharge Planning/Teaching Needs:  Home with sister in-law and brother in-law-intermittent assist, ex-wife to assist some also, here for family education today.      Team Discussion:  Family here for education, reached his goals and ready for discharge.  Medically stable.  Refused some meds today-MD aware.  Revisions to Treatment Plan:  None   Continued Need for Acute Rehabilitation Level of Care: The patient requires daily medical management by a physician  with specialized training in physical medicine and rehabilitation for the following conditions: Daily direction of a multidisciplinary physical rehabilitation program to ensure safe treatment while eliciting the highest outcome that is of practical value to the patient.: Yes Daily analysis of laboratory values and/or radiology reports with any subsequent need for medication adjustment of medical intervention for : Neurological problems  Minoru Chap, Gardiner Rhyme 09/08/2012, 11:42 AM

## 2012-09-06 NOTE — Progress Notes (Signed)
Physical Therapy Discharge Summary  Patient Details  Name: [name] MRN: [id number] Date of Birth: 1951-03-17  Today's Date: 09/06/2012 Time: 11:07-12:09 and  [id number] Time Calculation (min): 58 mins and 39 mins  Patient has met 10 of 10 long term goals due to improved activity tolerance, improved balance, improved postural control, increased strength, ability to compensate for deficits, functional use of  right lower extremity, improved attention, improved awareness and improved coordination.  Patient to discharge at a wheelchair level Modified Independent.   Patient's care partner is independent to provide the necessary physical assistance at discharge.  Reasons goals not met: NA  Recommendation:  Patient will benefit from ongoing skilled PT services in home health setting to continue to advance safe functional mobility, address ongoing impairments in abnormal gait pattern, decreased left leg strength, balance, endurance, safety, and minimize fall risk.  Equipment: RW, Wheel chair and cushion, toe off strap left foot  Reasons for discharge: discharge from hospital  Patient/family agrees with progress made and goals achieved: Yes  PT Discharge Precautions/Restrictions Precautions Precautions: Fall Precaution Comments: decreased gaze stabilization, proprioception, and sensation, abnormal increased tone, ataxia and left body inattention Restrictions Weight Bearing Restrictions: No   Pain Pain Assessment Pain Assessment: No/denies pain Vision/Perception  Vision - History Baseline Vision: Wears glasses only for reading Visual History: Cataracts (pt reports eye doctor recently said "developing" cataract) Patient Visual Report: Diplopia Vision - Assessment Eye Alignment: Within Functional Limits Tracking/Visual Pursuits: Able to track stimulus in all quads without difficulty Visual Fields: No apparent deficits Additional Comments: diplopia occurs in outer regions of  R & L superior fields  Cognition Orientation Level: Oriented X4 Comments: Patient reports his memory not as good since the stroke Sensation Sensation Light Touch: Impaired Detail Light Touch Impaired Details: Impaired LLE Stereognosis: Impaired Detail Stereognosis Impaired Details: Impaired LUE Hot/Cold: Impaired Detail Hot/Cold Impaired Details: Impaired LUE Proprioception: Impaired Detail Proprioception Impaired Details: Impaired LUE Coordination Gross Motor Movements are Fluid and Coordinated: No Fine Motor Movements are Fluid and Coordinated: No Coordination and Movement Description: Although improved, LUE is slow, ataxic, decreased rhythm, decreased accuracy with gross and fine motor activities. Motor  Motor Motor: Hemiplegia;Abnormal tone;Ataxia;Abnormal postural alignment and control Motor - Skilled Clinical Observations: Left leg ataxia with giat, decreased pelvic rotation and hip hiking during swing, more prominant with fatigue.  Decreased toe clearance during swing.   Motor - Discharge Observations: Left leg ataxia with giat, decreased pelvic rotation and hip hiking during swing, more prominant with fatigue.  Decreased toe clearance during swing.    Mobility Bed Mobility Supine to Sit: 6: Modified independent (Device/Increase time);HOB flat Sitting - Scoot to Edge of Bed: 6: Modified independent (Device/Increase time) Sit to Supine: 6: Modified independent (Device/Increase time) Transfers Sit to Stand: 6: Modified independent (Device/Increase time) Stand to Sit: 6: Modified independent (Device/Increase time) Stand Pivot Transfers: 6: Modified independent (Device/Increase time) Squat Pivot Transfers: 6: Modified independent (Device/Increase time);With armrests;With upper extremity assistance Locomotion  Ambulation Ambulation/Gait Assistance: 5: Supervision Ambulation Distance (Feet): 300 Feet Assistive device: Rolling walker Ambulation/Gait Assistance Details: Pt is  supervision during gait, even has he fatigues and left knee buckles and left toe drag becomes more pronounced he is able to compensate without outside assist to support left leg.  His abnormal gait pattern also becomes more pronounced.   Stairs / Additional Locomotion Stairs: Yes Stairs Assistance: 5: Supervision Stairs Assistance Details (indicate cue type and reason): decreased clearance of left foot to go up the steps and down the  steps.  Verbal cues to attend to the foot and make sure the whole foot has cleared the step.   Stair Management Technique: Forwards;With walker Number of Stairs: 1  Height of Stairs: 6  Curb: 5: Psychiatric nurse: Yes Wheelchair Assistance: 6: Modified independent (Device/Increase time) Environmental health practitioner: Both lower extermities;Right upper extremity Wheelchair Parts Management: Needs assistance Distance: 150  Trunk/Postural Assessment  Cervical Assessment Cervical Assessment: Within Functional Limits Thoracic Assessment Thoracic Assessment: Within Functional Limits Lumbar Assessment Lumbar Assessment: Within Functional Limits Postural Control Postural Control: Deficits on evaluation Trunk Control: Pt with decreased ability to find midline, shift to the right without visual or verbal cues.   Righting Reactions: Same as trunk control.  Pt needs visual and verbal cues to right to midline.    Balance Balance Balance Assessed: Yes Static Sitting Balance Static Sitting - Balance Support: No upper extremity supported;Feet supported Static Sitting - Level of Assistance: 7: Independent Dynamic Sitting Balance Dynamic Sitting - Balance Support: No upper extremity supported;Feet supported Dynamic Sitting - Level of Assistance: 6: Modified independent (Device/Increase time) Static Standing Balance Static Standing - Balance Support: Bilateral upper extremity supported Static Standing - Level of Assistance: 6: Modified  independent (Device/Increase time) Dynamic Standing Balance Dynamic Standing - Balance Support: Right upper extremity supported;Left upper extremity supported Dynamic Standing - Level of Assistance: 5: Stand by assistance Dynamic Standing - Balance Activities: Reaching for objects;Reaching across midline Dynamic Standing - Comments: Pt able to reach overhead in cabinents, below into cabinents and into refridgerator with at least one upper extremity assit.   Extremity Assessment  RUE Assessment RUE Assessment: Within Functional Limits LUE Assessment LUE Assessment: Exceptions to [facility] LUE Strength LUE Overall Strength Comments: Pt has advanced to Brunnstrum stage V in the arm and hand.  Full AROM throughout LUE and hand yet is limited due to excessive tone, decreased sensation, decreased proprioception and ataxia. RLE Assessment RLE Assessment: Within Functional Limits RLE AROM (degrees) RLE Overall AROM Comments: WFL LLE Assessment LLE Assessment: Exceptions to WFL LLE AROM (degrees) LLE Overall AROM Comments: decreased active dorsiflexion and decreased knee extension ROM due to weakness and increased tone in the left leg.   LLE Strength LLE Overall Strength: Deficits Left Hip Flexion: 3+/5 Left Knee Flexion: 3+/5 Left Knee Extension: 4/5 Left Ankle Dorsiflexion: 3-/5 Left Ankle Plantar Flexion: 3-/5 Left Ankle Inversion: 2/5 LLE Tone LLE Tone: Hypertonic Hypertonic Details: in flexion pattern, except at ankle which inverts and plantarflexed.   LLE Tone Comments: Pt is actively able to move out of tonal pattern with effort.    Skilled interventions: The first session today focused on family education looking at [facility] parts, use assembly and disassembly for putting in the car, safety with transfers into and out of WC and demonstration of pt mobilizing with WC.  We also performed family education on going up and down a curb in both the [facility] and with RW, son able to assist with both of  these.  WC mobility on unit and in home environment modified independent.  See details above for assist levels and distances.     The second session today focused on gait with RW and functional tasks in the ADL kitchen working on reaching for things in a home-type kitchen environment.  We were able to walk with the RW over various carpeted, tiled and laminate flooring with RW with supervision.  We worked on sit to stand from lower mat table with visual (using the mirror) and verbal cues  to find midline during the transition.  He still tends to list to the right.  See details above for assist levels and distances.    See FIM for current functional status  Wells Guiles B. Jeaneane Adamec, PT, DPT [phone]   09/06/2012, 4:41 PM

## 2012-09-06 NOTE — Progress Notes (Signed)
Occupational Therapy Discharge Summary and Treatment Notes  Patient Details  Name: [name] MRN: [id number] Date of Birth: Jun 08, 1951  Today's Date: 09/06/2012 Time: 0905-1005 and  145-230 Time Calculation (min): 60 min and 45 min  Patient has met 10 of 10 long term goals due to improved activity tolerance, improved balance, postural control, functional use of  LEFT upper and LEFT lower extremity, improved awareness and improved coordination.  Patient to discharge at overall Modified Independent at a W/C level, Min-Mod assist when using walker during BADL & IADL tasks.  Patient's care partner is independent and patient able to guide caregiver when assistance is needed. to provide the necessary physical and verbal cues assistance at discharge.    Reasons goals not met: n/a secondary to all goals met.  Recommendation:  Patient will benefit from ongoing skilled OT services in home health setting then to an outpatient setting to continue to advance functional skills in the area of BADL, iADL and Reduce care partner burden.  Equipment: tub bench and 3 n 1 commode  Reasons for discharge: treatment goals met and discharge from hospital  Patient/family agrees with progress made and goals achieved: Yes  OT Discharge Precautions/Restrictions  Precautions Precautions: Fall Precaution Comments: decreased gaze stabilization, proprioception, and sensation, abnormal increased tone, ataxia and left body inattention Restrictions Weight Bearing Restrictions: No Pain 4/10 pain on left side, "tightness is now causing actual pain", declines medication, "muscle relaxers make me tighter" Vision/Perception  Vision - History Baseline Vision: Wears glasses only for reading Visual History: Cataracts (pt reports eye doctor recently said "developing" cataract) Patient Visual Report: Diplopia Vision - Assessment Eye Alignment: Within Functional Limits Tracking/Visual Pursuits: Able to track  stimulus in all quads without difficulty Visual Fields: No apparent deficits Additional Comments: diplopia occurs in outer regions of R & L superior fields  Cognition Orientation Level: Oriented X4 Comments: Patient reports his memory not as good since the stroke Sensation Light Touch Impaired LUE Stereognosis Impaired LUE Hot/Cold Impaired LUE Proprioception Impaired LUE Coordination Gross Motor Movements are Fluid and Coordinated: No Fine Motor Movements are Fluid and Coordinated: No Coordination and Movement Description: Although improved, LUE is slow, ataxic, decreased rhythm, decreased accuracy with gross and fine motor activities. Motor  Motor: Hemiplegia;Abnormal postural alignment and control;Ataxia;Abnormal tone;Motor impersistence Mobility  Bed Mobility Supine to Sit: 6: Modified independent (Device/Increase time) Sitting - Scoot to Edge of Bed: 6: Modified independent (Device/Increase time) Sit to Supine: 6: Modified independent (Device/Increase time) Transfers Sit to Stand: 6: Modified independent (Device/Increase time);From toilet;From bed;With armrests;With upper extremity assist Stand to Sit: 6: Modified independent (Device/Increase time);To bed;With armrests;To toilet  Trunk/Postural Assessment  Cervical Assessment: Within Functional Limits Thoracic Assessment: Within Functional Limits Lumbar Assessment: Within Functional Limits Postural Control: Deficits on evaluation Trunk Control: Limited by decreased proprioception, decreased sensation and increased tone Righting Reactions: Limited by decreased proprioception, decreased sensation and increased tone  Balance Mod I with sitting balance and minimally dynamic standing balance with UE support during BADL tasks. Extremity/Trunk Assessment RUE Assessment RUE Assessment: Within Functional Limits LUE Assessment LUE Assessment: Exceptions to [facility] LUE Strength LUE Overall Strength Comments: Pt has advanced to  Brunnstrum stage V in the arm and hand.  Full AROM throughout LUE and hand yet is limited due to excessive tone, decreased sensation, decreased proprioception and ataxia.  Skilled Interventions 1)  Family present to engage in education prior to d/c home tomorrow.  Patient's wife observed and provided assistance independently during shower transfers, shower, dressing and grooming.  Reviewed plans for grab bar placement in the shower at home.  Wife and son verbilize understanding of patient's current level of function, report no questions and declare that they are ready to assist patient upon discharge.  2)  Bed>w/c>toilet >w/c transfers independently with use of grab bars and patient safe with all mobility therefore made Mod I in his room this afternoon from a w/c level.  Assistance needed when using walker and getting into and out of the shower.  Patient continues to have diplopia in end ranges of superior L & R fields and now has full ROM of LUE & hand yet still struggles with ataxia with gross and fine motor coordination.  See FIM for current functional status  Khloey Chern 09/06/2012, 3:59 PM

## 2012-09-06 NOTE — Progress Notes (Signed)
Social Work Patient ID: [name], male   DOB: [birth year], 61 y.o.   MRN: [id number] Pt requires a lightweight wheelchair to be able to self propel and uses it for his self care tasks.  He is unable to self propel a  Standard wheelchair.

## 2012-09-06 NOTE — Progress Notes (Signed)
Social Work Patient ID: [name], male   DOB: [birth year], 61 y.o.   MRN: [id number] Met with pt and wife who was here for family education to learn care prior to discharge tomorrow. Aware of team conference progressing toward goals for discharge.  DME and follow up arranged. SCAT application completed and faxed and given back to pt.  Pt feels ready for discharge tomorrow.

## 2012-09-07 DIAGNOSIS — I6330 Cerebral infarction due to thrombosis of unspecified cerebral artery: Secondary | ICD-10-CM

## 2012-09-07 DIAGNOSIS — G8110 Spastic hemiplegia affecting unspecified side: Secondary | ICD-10-CM

## 2012-09-07 DIAGNOSIS — Z5189 Encounter for other specified aftercare: Secondary | ICD-10-CM

## 2012-09-07 DIAGNOSIS — I69991 Dysphagia following unspecified cerebrovascular disease: Secondary | ICD-10-CM

## 2012-09-07 LAB — GLUCOSE, CAPILLARY: Glucose-Capillary: 143 mg/dL — ABNORMAL HIGH (ref 70–99)

## 2012-09-07 MED ORDER — MUSCLE RUB 10-15 % EX CREA: 1.0000 "application " | TOPICAL_CREAM | CUTANEOUS | Status: DC | PRN

## 2012-09-07 MED ORDER — POLYETHYLENE GLYCOL 3350 17 G PO PACK: 17.0000 g | PACK | Freq: Every day | ORAL | Status: DC

## 2012-09-07 MED ORDER — INSULIN ASPART 100 UNIT/ML ~~LOC~~ SOLN: SUBCUTANEOUS | Status: DC

## 2012-09-07 MED ORDER — TIZANIDINE HCL 2 MG PO TABS: 2.0000 mg | ORAL_TABLET | Freq: Three times a day (TID) | ORAL | Status: DC

## 2012-09-07 MED ORDER — SENNOSIDES-DOCUSATE SODIUM 8.6-50 MG PO TABS: 2.0000 | ORAL_TABLET | Freq: Two times a day (BID) | ORAL | Status: AC

## 2012-09-07 MED ORDER — INSULIN GLARGINE 100 UNIT/ML ~~LOC~~ SOLN: 15.0000 [IU] | Freq: Every day | SUBCUTANEOUS | Status: DC

## 2012-09-07 MED ORDER — POTASSIUM CHLORIDE CRYS ER 10 MEQ PO TBCR: 10.0000 meq | EXTENDED_RELEASE_TABLET | Freq: Two times a day (BID) | ORAL | Status: DC

## 2012-09-07 MED ORDER — TRAMADOL HCL 50 MG PO TABS: 50.0000 mg | ORAL_TABLET | Freq: Two times a day (BID) | ORAL | Status: DC

## 2012-09-07 MED ORDER — DICLOFENAC SODIUM 1 % TD GEL: 2.0000 g | Freq: Four times a day (QID) | TRANSDERMAL | Status: DC

## 2012-09-07 NOTE — Progress Notes (Signed)
Patient ID: [name], male   DOB: Sep 09, 1951, 61 y.o.   MRN: [id number] Subjective/Complaints: [name] is a 61 y.o. RH-male with a history of TIA, diabetes mellitus, hyperlipidemia, recent laryngoplasty 9/16 who presented with acute recurrent weakness and numbness involving his left side with facial weakness on 08/14/12 had a TIA with similar presentation in May 2013. Workup was unremarkable and patient placed on plavix for CVA prophylaxis. CT head negative. 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 2D echo done revealing EF 50-55% with grade 1 diastolic dysfunction. Carotid dopplers without ICA stenosis. Patient had worsening of symptoms past admission. 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 FEES done (patient has had problems with aspiration) and placed on D2, thin liquids with hard swallow.  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  HENT:       Respiratory: Positive for cough. Negative for sputum production, shortness of breath and wheezing.   Gastrointestinal: Positive for nausea. Negative for vomiting.  All other systems reviewed and are negative.   Objective: Vital Signs: Blood pressure 133/83, pulse 93, temperature 98.7 F (37.1 C), temperature source Oral, resp. rate 19, height 5\' 11"  (1.803 m), weight 88.2 kg (194 lb 7.1 oz), SpO2 98.00%. No results found. Results for orders placed during the hospital encounter of 08/18/12 (from the past 72 hour(s))  GLUCOSE, CAPILLARY     Status: Abnormal   Collection Time   09/04/12 11:41 AM      Component Value Range Comment   Glucose-Capillary 114 (*) 70 - 99 mg/dL    Comment 1  Notify RN     GLUCOSE, CAPILLARY     Status: Normal   Collection Time   09/04/12  4:09 PM      Component Value Range Comment   Glucose-Capillary 72  70 - 99 mg/dL   GLUCOSE, CAPILLARY     Status: Abnormal   Collection Time   09/04/12  9:15 PM      Component Value Range Comment   Glucose-Capillary 124 (*) 70 - 99 mg/dL   GLUCOSE, CAPILLARY     Status: Abnormal   Collection Time   09/05/12  7:08 AM      Component Value Range Comment   Glucose-Capillary 146 (*) 70 - 99 mg/dL    Comment 1 Notify RN     GLUCOSE, CAPILLARY     Status: Abnormal   Collection Time   09/05/12 11:20 AM      Component Value Range Comment   Glucose-Capillary 183 (*) 70 - 99 mg/dL    Comment 1 Notify RN     GLUCOSE, CAPILLARY     Status: Abnormal   Collection Time   09/05/12  4:58 PM      Component Value Range Comment   Glucose-Capillary 164 (*) 70 - 99 mg/dL   GLUCOSE, CAPILLARY     Status: Abnormal   Collection Time   09/05/12  9:02 PM      Component Value Range Comment   Glucose-Capillary 112 (*) 70 - 99 mg/dL   BASIC METABOLIC PANEL     Status: Abnormal   Collection Time   09/06/12  4:50 AM      Component Value Range  Comment   Sodium 140  135 - 145 mEq/L    Potassium 4.0  3.5 - 5.1 mEq/L    Chloride 104  96 - 112 mEq/L    CO2 30  19 - 32 mEq/L    Glucose, Bld 129 (*) 70 - 99 mg/dL    BUN 16  6 - 23 mg/dL    Creatinine, Ser 1.24  0.50 - 1.35 mg/dL    Calcium 9.4  8.4 - 10.5 mg/dL    GFR calc non Af Amer 61 (*) >90 mL/min    GFR calc Af Amer 70 (*) >90 mL/min   CBC     Status: Abnormal   Collection Time   09/06/12  4:50 AM      Component Value Range Comment   WBC 6.0  4.0 - 10.5 K/uL    RBC 3.95 (*) 4.22 - 5.81 MIL/uL    Hemoglobin 11.8 (*) 13.0 - 17.0 g/dL    HCT 35.6 (*) 39.0 - 52.0 %    MCV 90.1  78.0 - 100.0 fL    MCH 29.9  26.0 - 34.0 pg    MCHC 33.1  30.0 - 36.0 g/dL    RDW 12.2  11.5 - 15.5 %    Platelets 186  150 - 400 K/uL   GLUCOSE, CAPILLARY     Status: Abnormal    Collection Time   09/06/12  7:10 AM      Component Value Range Comment   Glucose-Capillary 129 (*) 70 - 99 mg/dL    Comment 1 Notify RN     GLUCOSE, CAPILLARY     Status: Abnormal   Collection Time   09/06/12 11:40 AM      Component Value Range Comment   Glucose-Capillary 122 (*) 70 - 99 mg/dL    Comment 1 Notify RN     GLUCOSE, CAPILLARY     Status: Normal   Collection Time   09/06/12  4:32 PM      Component Value Range Comment   Glucose-Capillary 83  70 - 99 mg/dL   GLUCOSE, CAPILLARY     Status: Abnormal   Collection Time   09/06/12  8:56 PM      Component Value Range Comment   Glucose-Capillary 137 (*) 70 - 99 mg/dL   GLUCOSE, CAPILLARY     Status: Abnormal   Collection Time   09/07/12  7:07 AM      Component Value Range Comment   Glucose-Capillary 143 (*) 70 - 99 mg/dL    Comment 1 Notify RN        HEENT: horse voice Cardio: RRR Resp: CTA B/L GI: BS positive Extremity:  No Edema Skin:   Intact Neuro: Alert/Oriented, Anxious, Abnormal Sensory reduced on left but able to distinguish Lt touch, Abnormal Motor 3 minus left deltoid bicep tricep grip as well as left hip flexor knee extensor 3- ankle dorsiflexor plantar flexor and Abnormal FMC Ataxic/ dec FMC and Tone  increased tone left abductors and extensors in the lower extremity Musc/Skel:  Other pain with resisted hip abduction. No tenderness over the lateral hip. No tenderness over the knee. Tenderness over the  medial hip adductors, mild tenderness at lateral ankle with inversion, no swelling peripheral pulses normal, normal skin temperature  in the feet [place] 1 spasticity Assessment/Plan: 1. 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 Stable for D/C   PMR f/u, PCP f/u , ENT  f/u, Neuro f/u    FIM: FIM - Bathing Bathing Steps Patient Completed: Chest;Right Arm;Left Arm;Abdomen;Front perineal area;Buttocks;Right  upper leg;Left upper leg;Right lower leg (including foot);Left lower leg (including foot) Bathing: 6: More than reasonable amount of time  FIM - Upper Body Dressing/Undressing Upper body dressing/undressing steps patient completed: Thread/unthread right sleeve of pullover shirt/dresss;Thread/unthread left sleeve of pullover shirt/dress;Put head through opening of pull over shirt/dress;Pull shirt over trunk Upper body dressing/undressing: 6: More than reasonable amount of time FIM - Lower Body Dressing/Undressing Lower body dressing/undressing steps patient completed: Thread/unthread right underwear leg;Thread/unthread left underwear leg;Pull underwear up/down;Thread/unthread right pants leg;Thread/unthread left pants leg;Pull pants up/down;Don/Doff right sock;Don/Doff left sock;Don/Doff right shoe;Don/Doff left shoe;Fasten/unfasten right shoe;Fasten/unfasten left shoe;Fasten/unfasten pants Lower body dressing/undressing: 6: More than reasonable amount of time  FIM - Toileting Toileting steps completed by patient: Adjust clothing prior to toileting;Performs perineal hygiene;Adjust clothing after toileting Toileting Assistive Devices: Grab bar or rail for support Toileting: 6: More than reasonable amount of time  FIM - Radio producer Devices: Grab bars;Elevated toilet seat Toilet Transfers: 6-More than reasonable amt of time  FIM - Control and instrumentation engineer Devices: Bed rails Bed/Chair Transfer: 6: Supine > Sit: No assist;6: Bed > Chair or W/C: No assist;6: Chair or W/C > Bed: No assist;6: Sit > Supine: No assist  FIM - Locomotion: Wheelchair Distance: 150 Locomotion: Wheelchair: 6: Travels 150 ft or more, turns around, maneuvers to table, bed or toilet, negotiates 3% grade: maneuvers on rugs and over door sills independently FIM - Locomotion: Ambulation Locomotion: Ambulation Assistive Devices: Administrator Ambulation/Gait Assistance:  5: Supervision Locomotion: Ambulation: 5: Travels 150 ft or more with supervision/safety issues  Comprehension Comprehension Mode: Auditory Comprehension: 6-Follows complex conversation/direction: With extra time/assistive device  Expression Expression Mode: Verbal Expression: 6-Expresses complex ideas: With extra time/assistive device  Social Interaction Social Interaction: 6-Interacts appropriately with others with medication or extra time (anti-anxiety, antidepressant).  Problem Solving Problem Solving Mode: Not assessed Problem Solving: 6-Solves complex problems: With extra time  Memory Memory Mode: Not assessed Memory: 6-More than reasonable amt of time   Medical Problem List and Plan:  1. DVT Prophylaxis/Anticoagulation: Pharmaceutical: Lovenox  2. Pain Management: tramadol, tylenol.    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 Mood: egosupport by team. He is fairly motivated  4. Neuropsych: This patient is capable of making decisions on his/her own behalf.  5. HTN: Monitor with bid checks. Avoid hypotension for now. Continue atenolol, HCTZ and off  lisinopril.  6. Constipation (acute on chronic):this may be contribute to nausea. scheduled softener/laxative, suppository or enema if needed  7. Thrush: nystatin mouth wash  8. Hyperlipidemia: zocor 10mg  daily 9. 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 His swallowing improved postoperatively and has not noticeably worsened post stroke.Will f/u with ENT as outpt pt to call oiffice    10. Urinary hesitancy DC diphenhydramine and trazodone,monitor, avoid  Anticholinergic agents 11.  DM good control, with current regimen 12.   Spasticity improved on Zanaflex although pt not consistently taking LOS (Days) 20 A FACE TO FACE EVALUATION WAS PERFORMED  KIRSTEINS,ANDREW E 09/07/2012, 8:24 AM

## 2012-09-07 NOTE — Progress Notes (Signed)
Social Work Discharge Note Discharge Note  The overall goal for the admission was met for:   Discharge location: [place], EX-WIFE TO ASSIST AT TIMES  Length of Stay: Yes-20 DAYS  Discharge activity level: Yes-SUPERVISION/MOD/I LEVEL  Home/community participation: Yes  Services provided included: MD, RD, PT, OT, SLP, RN, TR, Pharmacy and SW  Financial Services: Medicare and Private Insurance: [place]  Follow-up services arranged: Home Health: [place], DME: [facility], [facility], [facility], [facility] and Patient/Family has no preference for HH/DME agencies  Comments (or additional information):PT MOD/I LEVEL W/C, SO CAN BE SAFE WHEN ALONE.  IF UP AMBULATING NEEDS SUPERVISION LEVEL EX-WIFE TO HELP SOME AND SISTER IN-LAW AND BROTHER IN-LAW TO CHECK ON.  SCAT APPLICATION COMPLETED AND FAXED IN-ORIGINAL GIVEN TO PT.  Patient/Family verbalized understanding of follow-up arrangements: Yes  Individual responsible for coordination of the follow-up plan: SELF & JETE-DAUGHTER  Confirmed correct DME delivered: Elease Hashimoto 09/07/2012    Elease Hashimoto

## 2012-09-07 NOTE — Progress Notes (Signed)
Pt discharged to home with family. Education and discharge instructions given by PA, Love and RN. No further questions at this time. Belongings with family.

## 2012-09-08 LAB — GLUCOSE, CAPILLARY: Glucose-Capillary: 194 mg/dL — ABNORMAL HIGH (ref 70–99)

## 2012-09-12 NOTE — Discharge Summary (Signed)
NAMEKYHEIM, VUNCANNON NO.:  [id number]  MEDICAL RECORD NO.:  [id number]  LOCATION:  [id number]                         FACILITY:  [facility]  PHYSICIAN:  Charlett Blake, M.D.DATE OF BIRTH:  1951-08-20  DATE OF ADMISSION:  08/18/2012 DATE OF DISCHARGE:  09/07/2012                              DISCHARGE SUMMARY   DISCHARGE DIAGNOSES: 1. Thrombolytic right thalamic internal capsule infarct. 2. Hypertension. 3. Dyslipidemia. 4. Diabetes mellitus type 2. 5. History of idiopathic vocal cord paralysis with dysphagia.  HISTORY OF PRESENT ILLNESS:  [name] is a 61 year old right-handed male with history of TIA, diabetes mellitus, hyperlipidemia, recent laryngoplasty July 28, 2012, who presented to ED with acute recurrent weakness and numbness involving left side with facial weakness on August 14, 2012.  The patient then had a TIA with similar presentation in May of this year.  Workup at that time was unremarkable and the patient was placed on Plavix for CVA prophylaxis. MRI done at this time revealed acute infarct right thalamic posterior limb internal capsule, global atrophy without hydrocephalus and intracranial atherosclerosis.  A 2D echo done revealed EF of 50% to 55% and grade 1 diastolic dysfunction.  Carotid Dopplers done and negative for ICA stenosis.  The patient had worsening of symptoms past admission. Neurology felt the patient with stroke due to small vessel disease and advised continuing Plavix.  These were done to evaluate swallow and the patient was placed on D2 diet, thin liquids, with hard swallow per ST. The patient is developing left-sided spasms.  Therapies have been ongoing and therapy team recommended CIR for progression.  PAST MEDICAL HISTORY: 1. Coronary artery disease. 2. Allergic rhinitis. 3. Hyperlipidemia. 4. Hypertension. 5. Chronic right shoulder pain. 6. Chronic low back pain. 7. Occasional headaches. 8.  Peripheral neuropathy. 9. Reflux. 10.Diabetes mellitus. 11.Insomnia. 12.History of transient ischemic attack.  FUNCTIONAL HISTORY:  The patient was independent prior to admission.  He is retired.  He was attending school at [employer].  FUNCTIONAL STATUS:  The patient is min to mod assist for bed mobility, +2 total assist 40% for stand pivot transfers.  Gait not tested.  He required min assist for upper body bathing, mod assist for lower body bathing, upper body dressing as well as lower body dressing.  HOSPITAL COURSE:  [name] was admitted to Rehab on August 18, 2012, for inpatient therapies to consist of PT, OT, and speech therapy at least 3 hours 5 days a week.  Past admission, physiatrist, rehab, RN, and therapy team have worked together to provide customized collaborative interdisciplinary care.  Rehab RN has worked with the patient on bowel and bladder program as well as safety plan. The patient's blood pressures were checked on b.i.d. basis and these were noted to be reasonably controlled.  Initially, the patient was noted to have poor p.o. intake.  This had improved with advancement of diet by the time of discharge.  The patient and wife were educated about need for long-acting insulins and importance of blood sugar management for stroke prevention.  Blood sugars at the time of discharge were ranging from 83 to occasional high in the 190.    Routine labs were done during this stay.  He was noted to have mild hypokalemia, which was supplemented.  Lytes last check on September 06, 2012, revealed sodium 140, potassium 4.0, chloride 104, CO2 of 30, BUN 16, creatinine 1.24, glucose 129.  He was noted to have mild anemia with CBC revealing hemoglobin 11.8, hematocrit 35.6, white count 6.0, platelets 186.  The patient was noted to have problems with neuropathy as well as the emergent tone.  He was reluctant to use Lyrica, therefore this was not initiated.  He  was agreeable to try Zanaflex, however, consistently refused this prior to discharge, therefore this was discontinued.  During the patient's stay in rehab, team conferences were held to monitor the patient's progress, set goals, as well as discuss barriers to discharge.  OT has worked with the patient on self-care tasks as well as balance,gaze stabilization and vestibular issues.  They have been working on impaired gross and fine motor coordination of left upper extremity.  By the time of discharge, the patient was showing improvement in functional use of left upper and left lower extremity. He was modified independent at wheelchair level for self-care tasks.  He required min to mod assist when using walker during BADLs and IADL tasks.  He requires supervision for shower transfers.  Physical Therapy has worked with the patient on mobility as well as strengthening.  The patient is showing improvement in activity tolerance, attention, as well as coordination.  He was modified independent for transfers at wheelchair level.  Intermittent supervision is recommended with supervision when ambulating.    He does continue to have diplopia that occurs in outer regions of right and left superior fields.  He continue to have sensory deficits of left upper extremity.  Ataxia is resolving. He is able to ambulate up to 300 feet with supervision.  Supervision is recommended as when the patient with left knee instability and toe drag with fatigue. He was given a toe lifter to use  with HHPT. He is able to navigate 1 stair with supervision.  Further followup home health therapy to continue past discharge.  Speech Therapy has worked with the patient on dysphagia as well as vocal intensity. The patient was slowly advanced to regular diet and is tolerating this without difficulty.  He was educated on utilizing speech strategies for conversations.  Speech Therapy reviewed vocal hygiene program for home to  support best vocal cord function.  The patient was able to verbalize understanding of information.  He was able to speak in mostly glottic fry as a result of his vocal cord dysfunction not CVA.  Speech Therapy signed off and no further home health needs.  DISCHARGE MEDICATIONS: 1. Allegra 180 mg p.o. per day p.r.n. 2. Atenolol 25 mg p.o. per day. 3. Plavix 75 mg p.o. per day. 4. Voltaren gel to left knee and left ankle q.i.d. 5. Benadryl 25 mg p.o. q.6 h. p.r.n. 6. Flonase 2 squirts in each nostril daily. 7. Hydrochlorothiazide 12.5 mg p.o. per day. 8. NovoLog 5 units subcu t.i.d. with meals. 9. Lantus insulin 15 units subcu per day. 10.Nitroglycerin p.r.n. chest pain. 11.Protonix 40 mg p.o. b.i.d. 12.MiraLAX 17 g in 8 ounce p.o. per day. 13.K-Dur 10 mEq p.o. b.i.d. 14.Pravastatin 40 mg p.o. at bedtime. 15.Phenergan 25 mg p.o. q.6 h. p.r.n. nausea. 16.Senna-S 2 tabs p.o. b.i.d. 17.Tramadol 50 mg p.o. b.i.d.  DIET:  Carb-modified medium.  ACTIVITY LEVEL:  As tolerated at wheelchair level. Needs 24-hour Supervision/assistance.  SPECIAL INSTRUCTIONS:  Check blood sugars before meals and at bedtime. No  driving until cleared by Dr. Letta Pate.  [facility] to provide PT, OT, and RN.  FOLLOWUP:  The patient to follow up with Dr. Letta Pate on September 26, 2012, at 11:30 a.m.  Follow up with Dr. Antony Contras in 6 weeks.  Follow up with Dr. [name] on September 19, 2012, at 9 a.m.  Follow up with Dr. Arville Care for postop check.     Reesa Chew, P.A.   ______________________________ Charlett Blake, M.D.    PL/MEDQ  D:  09/11/2012  T:  09/12/2012  Job:  [id number]  cc:   Mateo Flow A. Asa Lente, MD Pramod P. Leonie Man, MD Anabel Bene. Constance Holster, MD

## 2012-09-13 ENCOUNTER — Encounter: Payer: Self-pay | Admitting: Internal Medicine

## 2012-09-13 ENCOUNTER — Ambulatory Visit (INDEPENDENT_AMBULATORY_CARE_PROVIDER_SITE_OTHER): Payer: Medicare Other | Admitting: Internal Medicine

## 2012-09-13 VITALS — BP 148/82 | HR 81 | Temp 97.8°F | Ht 71.0 in | Wt 197.0 lb

## 2012-09-13 DIAGNOSIS — I639 Cerebral infarction, unspecified: Secondary | ICD-10-CM

## 2012-09-13 DIAGNOSIS — M5432 Sciatica, left side: Secondary | ICD-10-CM

## 2012-09-13 DIAGNOSIS — M5430 Sciatica, unspecified side: Secondary | ICD-10-CM

## 2012-09-13 DIAGNOSIS — IMO0001 Reserved for inherently not codable concepts without codable children: Secondary | ICD-10-CM

## 2012-09-13 DIAGNOSIS — M79609 Pain in unspecified limb: Secondary | ICD-10-CM

## 2012-09-13 DIAGNOSIS — M79605 Pain in left leg: Secondary | ICD-10-CM

## 2012-09-13 DIAGNOSIS — I6350 Cerebral infarction due to unspecified occlusion or stenosis of unspecified cerebral artery: Secondary | ICD-10-CM

## 2012-09-13 DIAGNOSIS — R1310 Dysphagia, unspecified: Secondary | ICD-10-CM

## 2012-09-13 MED ORDER — BACLOFEN 10 MG PO TABS: 10.0000 mg | ORAL_TABLET | Freq: Three times a day (TID) | ORAL | Status: DC

## 2012-09-13 MED ORDER — HYDROCODONE-ACETAMINOPHEN 5-500 MG PO TABS: 1.0000 | ORAL_TABLET | Freq: Three times a day (TID) | ORAL | Status: DC | PRN

## 2012-09-13 MED ORDER — GLUCERNA SHAKE PO LIQD: 237.0000 mL | Freq: Three times a day (TID) | ORAL | Status: DC

## 2012-09-13 MED ORDER — PREDNISONE (PAK) 10 MG PO TABS: 10.0000 mg | ORAL_TABLET | ORAL | Status: DC

## 2012-09-13 NOTE — Progress Notes (Signed)
Subjective:    Patient ID: [name], male    DOB: 1950/11/30, 61 y.o.   MRN: [id number]  HPI  Here for follow up  - reviewed interval history [name] is an 61 y.o. male with a history of TIA, diabetes mellitus, hyperlipidemia and hypertension, presenting with acute recurrent weakness and numbness involving his left side. He was last seen normal 08/14/2012 at 1000. He had a TIA with similar presentation in May 2013. Workup was unremarkable. Patient is on aspirin at that time and was switched to Plavix 75 mg per day. CT scan of his head today showed no acute intracranial abnormality. Left facial weakness as well as left upper extremity weakness improved after arriving in the emergency room. Numbness was persistent, mass was moderately severe weakness involving left lower extremity proximally. NIH stroke score initially was 6. Subsequent score was 5. He is status post laryngeal surgery less than one week ago. Patient was not a TPA candidate secondary to recent major surgery and resolving deficits. He was admitted for further evaluation and treatment.   Inpatient Rehab DATE OF ADMISSION:  08/18/2012 DATE OF DISCHARGE:  09/07/2012                               DISCHARGE SUMMARY     DISCHARGE DIAGNOSES: 1. Thrombolytic right thalamic internal capsule infarct. 2. Hypertension. 3. Dyslipidemia. 4. Diabetes mellitus type 2. 5. History of idiopathic vocal cord paralysis with dysphagia   continued dysphagia Worse with solids but also  Liquids Regurgitation after meals associated with hoarseness and sensation of mucus/gagging/cough - working with ENT for same Not relieved with syrup or antibiotics    Also complains of left leg and hip pain with spasms History of similar symptoms with remote sciatica and ruptured lumbar disc Current pain symptoms not relieved with Lyrica or tramadol Difficult to assess if strength affected because of recent right hemisphere CVA causing left  hemiparesis and hemisensory deficits  Past Medical History  Diagnosis Date  . ALLERGIC RHINITIS   . CAD, NATIVE VESSEL     BMS to OM1 2001, DES to BMS 2005  . DIAB W/UNSPEC COMP TYPE II/UNSPEC TYPE UNCNTRL   . ERECTILE DYSFUNCTION   . HYPERLIPIDEMIA-MIXED   . MORTON'S NEUROMA, RIGHT   . SHOULDER PAIN, RIGHT   . HYPERTENSION, BENIGN   . Peripheral neuropathy   . Chronic back pain     herniated disc  . GERD   . Constipation     takes Carafate four times day  . Seasonal allergies     takes Allegra and Benadryl daily prn;uses Flonase daily  . TIA on medication 02/2012  . 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 Stroke     History  Substance Use Topics  . Smoking status: Former Smoker    Quit date: 02/24/1983  . Smokeless tobacco: Never Used   Comment: Married, livesin [place] with wife. He is disable secondary to back pain. 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 Alcohol Use: No     Review of Systems  Constitutional: Positive for appetite change, fatigue and unexpected weight change. Negative for fever.  Respiratory: Positive for cough and choking. Negative for shortness of breath.   Gastrointestinal: Positive for constipation. Negative for abdominal pain and abdominal distention.       See HPI  Musculoskeletal:  Positive for myalgias and gait problem. Negative for back pain and joint swelling.  Neurological: Positive for tremors (LLE, worse at night and after exertion), weakness and numbness. Negative for dizziness, seizures, speech difficulty and headaches.  Psychiatric/Behavioral: Negative for behavioral problems and confusion.  No other specific complaints in a complete review of systems (except as listed in HPI above).      Objective:   Physical Exam  BP 148/82  Pulse 81  Temp 97.8 F (36.6 C) (Oral)  Ht 5\' 11"  (1.803 m)  Wt 197 lb (89.359 kg)  BMI 27.48 kg/m2  SpO2 98% Wt Readings from  Last 3 Encounters:  09/13/12 197 lb (89.359 kg)  09/06/12 194 lb 7.1 oz (88.2 kg)  08/16/12 196 lb 11.2 oz (89.223 kg)   Constitutional:  He appears well-developed and well-nourished. No distress. Wife at side Neck: Normal range of motion. Neck supple. No JVD present. No thyromegaly present.  Cardiovascular: Normal rate, regular rhythm and normal heart sounds.  No murmur heard. no BLE edema Pulmonary/Chest: Effort normal and breath sounds normal. No respiratory distress. no wheezes.  Abdominal: Soft. Bowel sounds are normal. Patient exhibits no distension. There is no tenderness.  Neurologic: min L facial paralysis, no significant dysarthria, no aphasia - LUE with mild hemiparesis MSkel: L hip with full ROM, nontender to groin palpation. Back: full range of motion of thoracic and lumbar spine. Non tender to palpation. Positive ipsilateral straight leg raise. DTR's are intact but hyperactive on left causing residual involuntary spasm of L hip flexors. patient ambulates with antalgic gait.  Skin - no bruise or rash Psychiatric: he has a normal mood and affect. behavior is normal. Judgment and thought content normal.   Lab Results  Component Value Date   WBC 6.0 09/06/2012   HGB 11.8* 09/06/2012   HCT 35.6* 09/06/2012   PLT 186 09/06/2012   GLUCOSE 129* 09/06/2012   CHOL 90 08/15/2012   TRIG 59 08/15/2012   HDL 52 08/15/2012   LDLDIRECT 112.2 02/25/2010   LDLCALC 26 08/15/2012   ALT 65* 08/21/2012   AST 54* 08/21/2012   NA 140 09/06/2012   K 4.0 09/06/2012   CL 104 09/06/2012   CREATININE 1.24 09/06/2012   BUN 16 09/06/2012   CO2 30 09/06/2012   PSA 0.16 04/13/2010   INR 1.06 08/14/2012   HGBA1C 9.4* 08/15/2012       Assessment & Plan:   Dysphagia with regurgitation and weight loss, ongoing > 3 mo - reschedule refer to GI, continue PPI BID, continue carafate (see GERD below)  Vocal cord paralysis with "cough" - s/p laryngoscopy 9/16 by ENT for same - mild persisting hoarseness  Left  leg pain, involuntary spasms related to recent CVA 08/14/12.  also suspect element of overlapping lumbar radiculopathy with history of sciatica  - Treat with Pred pack for inflammation, hydrocodone for unrelieved pain and baclofen for muscle spasm Referred to spine specialist  Time spent with pt/family today 41 minutes, greater than 50% time spent counseling patient on recent ENT surgery, hospitalization and CIR for CVA and medication review. Also review of prior records

## 2012-09-13 NOTE — Assessment & Plan Note (Signed)
Uncontrolled Changed to Lantus 03/2012 hosp for TIA - off metformin/januvia Continue Novolog TID AC Also encouraged follow up with endo - [facility] Lab Results  Component Value Date   HGBA1C 9.4* 08/15/2012

## 2012-09-13 NOTE — Patient Instructions (Signed)
It was good to see you today. We have reviewed your hospital and rehab records including labs and tests today Take prednisone for back and leg inflammation, hydrocodone as needed for pain and baclofen as needed for muscle spasms  Use glucerna for meal replacement until seen by GI  Your prescription(s) have been submitted to your pharmacy. Please take as directed and contact our office if you believe you are having problem(s) with the medication(s). we'll make new referral to GI specialist for your swallow and cough problem as needed. Also will refer to back specialist for your left leg pain. Our office will contact you regarding appointment(s) once made. Please schedule followup in 2 weeks, call sooner if problems.

## 2012-09-15 ENCOUNTER — Encounter: Payer: Self-pay | Admitting: Gastroenterology

## 2012-09-19 ENCOUNTER — Telehealth: Payer: Self-pay | Admitting: Internal Medicine

## 2012-09-19 ENCOUNTER — Ambulatory Visit: Payer: Medicare Other | Admitting: Internal Medicine

## 2012-09-19 NOTE — Telephone Encounter (Signed)
Caller: Tammy/Patient; Patient Name: [name]; PCP: [name] (Adults only); Best Callback Phone Number: [phone]; Reason for call: Chest Pain/Chest Discomfort. Rn speaks with [place] with [facility]. She's calling because pt reported chest pain this morning. Rn inquired about his current sx's but she said she's not with the patient. RN advised she will call pt now to assess. Mr Brackin denies chest pain. He says he told the Rn that he felt something "different" in his lungs this morning but says it does not hurt. He does not want to discuss with Rn saying his feeling is slight and "it can wait." Rn pressed and learned he has no pain, no SOB, no wheezing, afebrile. Denies any change in activity level. Sts he has had dry cough since 10/26. Denies any other new sx's and continues to politely  express a wish not to discuss his sx's. Rn advised appt to evaluate due to pt history. He declines and says he will discuss with the doctor he has an appt with 11/1-not sure name.

## 2012-09-20 ENCOUNTER — Emergency Department (HOSPITAL_COMMUNITY): Payer: Medicare Other

## 2012-09-20 ENCOUNTER — Encounter (HOSPITAL_COMMUNITY): Payer: Self-pay

## 2012-09-20 ENCOUNTER — Emergency Department (HOSPITAL_COMMUNITY)
Admission: EM | Admit: 2012-09-20 | Discharge: 2012-09-21 | Disposition: A | Discharge status: Home / Self Care | Payer: Medicare Other | Attending: Emergency Medicine | Admitting: Emergency Medicine

## 2012-09-20 DIAGNOSIS — K5900 Constipation, unspecified: Secondary | ICD-10-CM | POA: Insufficient documentation

## 2012-09-20 DIAGNOSIS — I69959 Hemiplegia and hemiparesis following unspecified cerebrovascular disease affecting unspecified side: Secondary | ICD-10-CM | POA: Insufficient documentation

## 2012-09-20 DIAGNOSIS — R209 Unspecified disturbances of skin sensation: Secondary | ICD-10-CM | POA: Insufficient documentation

## 2012-09-20 DIAGNOSIS — I10 Essential (primary) hypertension: Secondary | ICD-10-CM | POA: Insufficient documentation

## 2012-09-20 DIAGNOSIS — K219 Gastro-esophageal reflux disease without esophagitis: Secondary | ICD-10-CM | POA: Insufficient documentation

## 2012-09-20 DIAGNOSIS — G5760 Lesion of plantar nerve, unspecified lower limb: Secondary | ICD-10-CM | POA: Insufficient documentation

## 2012-09-20 DIAGNOSIS — J309 Allergic rhinitis, unspecified: Secondary | ICD-10-CM | POA: Insufficient documentation

## 2012-09-20 DIAGNOSIS — R202 Paresthesia of skin: Secondary | ICD-10-CM

## 2012-09-20 DIAGNOSIS — I2510 Atherosclerotic heart disease of native coronary artery without angina pectoris: Secondary | ICD-10-CM | POA: Insufficient documentation

## 2012-09-20 DIAGNOSIS — Z794 Long term (current) use of insulin: Secondary | ICD-10-CM | POA: Insufficient documentation

## 2012-09-20 DIAGNOSIS — E785 Hyperlipidemia, unspecified: Secondary | ICD-10-CM | POA: Insufficient documentation

## 2012-09-20 DIAGNOSIS — Z79899 Other long term (current) drug therapy: Secondary | ICD-10-CM | POA: Insufficient documentation

## 2012-09-20 DIAGNOSIS — Z87891 Personal history of nicotine dependence: Secondary | ICD-10-CM | POA: Insufficient documentation

## 2012-09-20 DIAGNOSIS — G609 Hereditary and idiopathic neuropathy, unspecified: Secondary | ICD-10-CM | POA: Insufficient documentation

## 2012-09-20 DIAGNOSIS — IMO0001 Reserved for inherently not codable concepts without codable children: Secondary | ICD-10-CM | POA: Insufficient documentation

## 2012-09-20 LAB — CBC WITH DIFFERENTIAL/PLATELET
Basophils Absolute: 0 10*3/uL (ref 0.0–0.1)
Basophils Relative: 0 % (ref 0–1)
Eosinophils Absolute: 0.1 10*3/uL (ref 0.0–0.7)
Eosinophils Relative: 1 % (ref 0–5)
HCT: 38.3 % — ABNORMAL LOW (ref 39.0–52.0)
Hemoglobin: 13.2 g/dL (ref 13.0–17.0)
Lymphocytes Relative: 41 % (ref 12–46)
Lymphs Abs: 3.3 10*3/uL (ref 0.7–4.0)
MCH: 30.7 pg (ref 26.0–34.0)
MCHC: 34.5 g/dL (ref 30.0–36.0)
MCV: 89.1 fL (ref 78.0–100.0)
Monocytes Absolute: 0.7 10*3/uL (ref 0.1–1.0)
Monocytes Relative: 9 % (ref 3–12)
Neutro Abs: 4.1 10*3/uL (ref 1.7–7.7)
Neutrophils Relative %: 50 % (ref 43–77)
Platelets: 234 10*3/uL (ref 150–400)
RBC: 4.3 MIL/uL (ref 4.22–5.81)
RDW: 12.5 % (ref 11.5–15.5)
WBC: 8.3 10*3/uL (ref 4.0–10.5)

## 2012-09-20 LAB — BASIC METABOLIC PANEL
BUN: 25 mg/dL — ABNORMAL HIGH (ref 6–23)
CO2: 33 mEq/L — ABNORMAL HIGH (ref 19–32)
Calcium: 9.5 mg/dL (ref 8.4–10.5)
Chloride: 102 mEq/L (ref 96–112)
Creatinine, Ser: 1.32 mg/dL (ref 0.50–1.35)
GFR calc Af Amer: 65 mL/min — ABNORMAL LOW (ref >90)
GFR calc non Af Amer: 56 mL/min — ABNORMAL LOW (ref >90)
Glucose, Bld: 105 mg/dL — ABNORMAL HIGH (ref 70–99)
Potassium: 3.4 mEq/L — ABNORMAL LOW (ref 3.5–5.1)
Sodium: 141 mEq/L (ref 135–145)

## 2012-09-20 NOTE — ED Notes (Signed)
Throat surgery on 9/16, and on 9/23 he had a CVA with some left sided deficits.

## 2012-09-20 NOTE — ED Notes (Signed)
Pt has complaints of his face feeling swollen.

## 2012-09-20 NOTE — Telephone Encounter (Signed)
Notified pt with md response. Pt agreed to come in sooner transferred to schedulers to move appt...lmb

## 2012-09-20 NOTE — Telephone Encounter (Signed)
Please offer to move up his 11/11 f/u OV if he wishes to review/discuss sooner - thanks

## 2012-09-20 NOTE — ED Notes (Signed)
MD at bedside. 

## 2012-09-20 NOTE — ED Notes (Signed)
1.5 started numbness on left side and radiating around lips and throat and felt like "throat was closing" causing shob.  Discomfort in his chest since yesterday.

## 2012-09-21 NOTE — ED Provider Notes (Signed)
I saw and evaluated the patient, reviewed the resident's note and I agree with the findings and plan.   Date: 09/21/2012  Rate: 62  Rhythm: normal sinus rhythm  QRS Axis: normal  Intervals: normal  ST/T Wave abnormalities: normal  Conduction Disutrbances: none  Narrative Interpretation:   Old EKG Reviewed: No significant changes noted  The patient's symptoms do not sound like ACS.  This does not sound like a stroke.  It seems as though when he began having the cramping sensation in the left side of his body his left arm and the cramping began to move upbecame concerned.  I suspect the patient may have had an anxiety attack which may have been his sensation of difficulty breathing and a choking sensation in his throat.  The patient is tolerating oral fluids at this time.  I palpated the patient's neck and it is soft and supple without secondary signs of infection.  Oral pharynx is open and patent.  He has no tonsillar swelling or exudate.  His dentition is normal.  Tolerating secretions.  His sub-lingular space is soft.  The patient no longer has any symptoms.  Discharge home with PCP followup.  He's also been told to follow up with his ear nose and throat surgeon regarding his meds his larynx.  I don't believe this to be in mesh issue at this time     Hoy Morn, MD 09/21/12 [phone]

## 2012-09-21 NOTE — ED Provider Notes (Signed)
History     CSN: [id number]  Arrival date & time 09/20/12  2144   First MD Initiated Contact with Patient 09/20/12 2215      Chief Complaint  Patient presents with  . Numbness    (Consider location/radiation/quality/duration/timing/severity/associated sxs/prior treatment) HPI Comments: Patient was resting in bed and he had a sudden worsening of his paresthesias. They normally involve the left upper and lower extremities. The paresthesias present in his left neck and left face. This has since improved but is still present, mild. He also had a transient episode of the sensation of his throat closing it lasted less than 5 minutes. At that time he also had diaphoresis. This has since resolved and he no longer has the sensation  Patient is a 61 y.o. male presenting with neurologic complaint. The history is provided by the patient and the spouse. No language interpreter was used.  Neurologic Problem The primary symptoms include paresthesias. Primary symptoms do not include headaches, loss of consciousness, dizziness, fever, nausea or vomiting. The symptoms began 1 to 2 hours ago. The episode lasted 5 minutes. The symptoms are improving. The neurological symptoms are focal.  Paresthesias began 1 - 3 hours ago. The paresthesias are improving. The paresthesias are described as tingling. Affected locations include the: neck, head and left side of the face.  Additional symptoms do not include neck stiffness, weakness or photophobia. Medical issues also include cerebral vascular accident, diabetes and hypertension. Medical issues do not include seizures or cancer. Workup history includes MRI and CT scan.    Past Medical History  Diagnosis Date  . ALLERGIC RHINITIS   . CAD, NATIVE VESSEL     BMS to OM1 2001, DES to BMS 2005  . DIAB W/UNSPEC COMP TYPE II/UNSPEC TYPE UNCNTRL   . ERECTILE DYSFUNCTION   . HYPERLIPIDEMIA-MIXED   . MORTON'S NEUROMA, RIGHT   . SHOULDER PAIN, RIGHT   . HYPERTENSION,  BENIGN   . Peripheral neuropathy   . Chronic back pain     herniated disc  . GERD   . Constipation     takes Carafate four times day  . Seasonal allergies     takes Allegra and Benadryl daily prn;uses Flonase daily  . TIA on medication 02/2012  . 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 Stent 2001, 2004    coronary stents  . Angioplasty   . Left knee surgury     x 2   . Dental surgery   . Coronary angioplasty 2005    2 stents  . Colonoscopy   . Laryngoplasty 08/07/2012    Procedure: LARYNGOPLASTY;  Surgeon: Izora Gala, MD;  Location: [facility] OR;  Service: ENT;  Laterality: Left;  Left Vocal Cord Medialyzation    Family History  Problem Relation Age of Onset  . Stroke      History  Substance Use Topics  . Smoking status: Former Smoker    Quit date: 02/24/1983  . Smokeless tobacco: Never Used   Comment: Married, lives in [place] with wife. He is disabled secondary to back pain. 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 Alcohol Use: No      Review of Systems  Constitutional: Positive for diaphoresis (transient, resolved). Negative for fever, activity change and appetite change.  HENT: Negative for sore throat, neck pain and neck stiffness.   Eyes: Negative for photophobia, discharge and visual disturbance.  Respiratory: Positive  for shortness of breath (sensation of throat closing.). Negative for cough, choking, wheezing and stridor.   Cardiovascular: Negative for chest pain and leg swelling.  Gastrointestinal: Negative for nausea, vomiting, abdominal pain, diarrhea and constipation.  Genitourinary: Negative for dysuria and difficulty urinating.  Musculoskeletal: Negative for back pain and arthralgias.  Skin: Negative for color change and pallor.  Neurological: Positive for numbness (paresthesias) and paresthesias. Negative for dizziness, loss of consciousness, speech difficulty, weakness, light-headedness and  headaches.  Psychiatric/Behavioral: Negative for behavioral problems and agitation.  All other systems reviewed and are negative.    Allergies  Penicillins; Pneumococcal vaccines; Shellfish allergy; Gabapentin; and Lisinopril  Home Medications   Current Outpatient Rx  Name Route Sig Dispense Refill  . ATENOLOL 25 MG PO TABS Oral Take 25 mg by mouth every morning.     Marland Kitchen BACLOFEN 10 MG PO TABS Oral Take 1 tablet (10 mg total) by mouth 3 (three) times daily. 30 each 0  . CLOPIDOGREL BISULFATE 75 MG PO TABS Oral Take 75 mg by mouth every evening.     Marland Kitchen DICLOFENAC SODIUM 1 % TD GEL Topical Apply 2 g topically 4 (four) times daily. Apply to left knee and left ankle. 4 Tube 1  . DIPHENHYDRAMINE HCL 25 MG PO TABS Oral Take 25 mg by mouth every 6 (six) hours as needed. For allergies    . GLUCERNA SHAKE PO LIQD Oral Take 237 mLs by mouth 3 (three) times daily between meals. 30 Can 0  . FEXOFENADINE HCL 180 MG PO TABS Oral Take 180 mg by mouth daily as needed. For allergies    . FLUTICASONE PROPIONATE 50 MCG/ACT NA SUSP Nasal Place 2 sprays into the nose daily as needed. For dry nasal passages    . HYDROCHLOROTHIAZIDE 12.5 MG PO CAPS Oral Take 12.5 mg by mouth every morning.    . INSULIN ASPART 100 UNIT/ML [place] SOLN  5 units with meals three times a day 1 vial 1  . INSULIN GLARGINE 100 UNIT/ML  SOLN Subcutaneous Inject 15 Units into the skin every morning.    . MUSCLE RUB 10-15 % EX CREA Topical Apply 1 application topically 3 (three) times daily as needed. For achy muscles    . NITROGLYCERIN 0.4 MG SL SUBL Sublingual Place 0.4 mg under the tongue every 5 (five) minutes as needed. For chest pain    . PANTOPRAZOLE SODIUM 40 MG PO TBEC Oral Take 1 tablet (40 mg total) by mouth 2 (two) times daily. 60 tablet 3  . POLYETHYLENE GLYCOL 3350 PO PACK Oral Take 17 g by mouth daily. For constipation 14 each   . POTASSIUM CHLORIDE CRYS ER 10 MEQ PO TBCR Oral Take 1 tablet (10 mEq total) by mouth 2 (two) times  daily. 60 tablet 1  . PRAVASTATIN SODIUM 40 MG PO TABS Oral Take 40 mg by mouth at bedtime.     Orlie Dakin SODIUM 8.6-50 MG PO TABS Oral Take 2 tablets by mouth 2 (two) times daily. For constipation.    . TRAMADOL HCL 50 MG PO TABS Oral Take 1 tablet (50 mg total) by mouth 2 (two) times daily with breakfast and lunch. For pain. 60 tablet 1    BP 159/85  Pulse 66  Temp 98.2 F (36.8 C) (Oral)  Resp 23  SpO2 98%  Physical Exam  Constitutional: He is oriented to person, place, and time. He appears well-developed. No distress.  HENT:  Head: Normocephalic and atraumatic.  Mouth/Throat: No oropharyngeal exudate.  Eyes:  EOM are normal. Pupils are equal, round, and reactive to light. Right eye exhibits no discharge. Left eye exhibits no discharge.  Neck: Normal range of motion. Neck supple. No JVD present.  Cardiovascular: Normal rate, regular rhythm and normal heart sounds.   Pulmonary/Chest: Effort normal and breath sounds normal. No stridor. No respiratory distress. He has no wheezes. He has no rales. He exhibits no tenderness.  Abdominal: Soft. Bowel sounds are normal. There is no tenderness. There is no guarding.  Genitourinary: Penis normal.  Musculoskeletal: Normal range of motion. He exhibits no edema and no tenderness.  Neurological: He is alert and oriented to person, place, and time. He has normal reflexes. He displays normal reflexes. No cranial nerve deficit. He exhibits normal muscle tone. Coordination normal.       Paresthesias over entire LUE and LLE, also involving L neck and L face and head  Skin: Skin is warm and dry. He is not diaphoretic. No erythema. No pallor.  Psychiatric: He has a normal mood and affect. His behavior is normal. Judgment and thought content normal.    ED Course  Procedures (including critical care time)  Labs Reviewed  CBC WITH DIFFERENTIAL - Abnormal; Notable for the following:    HCT 38.3 (*)     All other components within normal  limits  BASIC METABOLIC PANEL - Abnormal; Notable for the following:    Potassium 3.4 (*)     CO2 33 (*)     Glucose, Bld 105 (*)     BUN 25 (*)     GFR calc non Af Amer 56 (*)     GFR calc Af Amer 65 (*)     All other components within normal limits   Ct Head Wo Contrast  09/20/2012  *RADIOLOGY REPORT*  Clinical Data: Left-sided numbness extending around lips and throat.  CT HEAD WITHOUT CONTRAST  Technique:  Contiguous axial images were obtained from the base of the skull through the vertex without contrast.  Comparison: MRI of the brain 08/15/2012.  Findings: The subacute non hemorrhagic infarct of the posterior limb right internal capsule and thalamus is again noted.  Remote ischemic changes are noted adjacent to the frontal horn of the left lateral ventricle.  Additional subcortical white matter changes are stable bilaterally.  No acute cortical infarct, hemorrhage, mass lesion is present.  The ventricles are of normal size.  No significant extra-axial fluid collection is present.  A remote lacunar infarct of the right caudate head is again noted.  The paranasal sinuses and mastoid air cells are clear.  The osseous skull is intact.  IMPRESSION:  1.  Stable appearance of the subacute non hemorrhagic infarct in the posterior limb right internal capsule and thalamus. 2.  Remote lacunar infarct of the right caudate head. 3.  Extensive subcortical white matter changes are stable. 4.  No acute intracranial abnormality.   Original Report Authenticated By: Resa Miner. MATTERN, M.D.      1. Paresthesia       MDM  Due to history of stroke recently and worsening paresthesias, CT head was performed to evaluate. This showed no acute changes compared to the prior CT. Patient denies headache, doubt subarachnoid hemorrhage. The patient had no motor deficits compared to his baseline, he does have some mild worsening of his paresthesias over his left neck. An MRI was considered but at this point it would  not change management since the patient is medically optimized and hasn't been appropriately risk stratified prior admission for  his previous stroke. He was instructed to call neurology tomorrow and his primary doctor for close followup. Doubt infectious etiology for patient's symptoms, doubt hypoglycemia. Concerning his throat closing he is now asymptomatic from that standpoint. This may be related to his transient sensory change related to his baseline neurological deficits versus anxiety related. He also had a vocal cord procedure done 2 months ago but has had no complications since then I doubt that this has anything to do with the clinical presentation. Normal exam, normal oropharynx on exam, no stridor, no respiratory distress. Pt deemed stable for discharge. Return precautions were provided and pt expressed understanding to return to ED if any acute symptoms return. Follow up was instructed which pt also expressed understanding. All questions were answered and pt was in agreement w/ plan.         Verdie Shire, MD 09/21/12 [id number]

## 2012-09-25 ENCOUNTER — Ambulatory Visit (INDEPENDENT_AMBULATORY_CARE_PROVIDER_SITE_OTHER): Payer: Medicare Other | Admitting: Internal Medicine

## 2012-09-25 ENCOUNTER — Telehealth: Payer: Self-pay

## 2012-09-25 ENCOUNTER — Encounter: Payer: Self-pay | Admitting: Internal Medicine

## 2012-09-25 VITALS — BP 172/92 | HR 84 | Temp 98.3°F | Ht 71.0 in | Wt 190.1 lb

## 2012-09-25 DIAGNOSIS — M5432 Sciatica, left side: Secondary | ICD-10-CM

## 2012-09-25 DIAGNOSIS — M5430 Sciatica, unspecified side: Secondary | ICD-10-CM

## 2012-09-25 DIAGNOSIS — I10 Essential (primary) hypertension: Secondary | ICD-10-CM

## 2012-09-25 DIAGNOSIS — I6350 Cerebral infarction due to unspecified occlusion or stenosis of unspecified cerebral artery: Secondary | ICD-10-CM

## 2012-09-25 DIAGNOSIS — I639 Cerebral infarction, unspecified: Secondary | ICD-10-CM

## 2012-09-25 DIAGNOSIS — R1310 Dysphagia, unspecified: Secondary | ICD-10-CM

## 2012-09-25 MED ORDER — BACLOFEN 10 MG PO TABS: 10.0000 mg | ORAL_TABLET | Freq: Four times a day (QID) | ORAL | Status: DC

## 2012-09-25 MED ORDER — GLUCOSE BLOOD VI STRP: ORAL_STRIP | Status: DC

## 2012-09-25 MED ORDER — PROMETHAZINE-CODEINE 6.25-10 MG/5ML PO SYRP: 5.0000 mL | ORAL_SOLUTION | ORAL | Status: AC | PRN

## 2012-09-25 MED ORDER — TRAMADOL HCL 50 MG PO TABS: 50.0000 mg | ORAL_TABLET | Freq: Four times a day (QID) | ORAL | Status: DC | PRN

## 2012-09-25 MED ORDER — PRODIGY LANCETS 28G MISC: 1.0000 | Freq: Every day | Status: DC

## 2012-09-25 MED ORDER — AMLODIPINE BESYLATE 5 MG PO TABS: 5.0000 mg | ORAL_TABLET | Freq: Every day | ORAL | Status: DC

## 2012-09-25 NOTE — Progress Notes (Signed)
Subjective:    Patient ID: [name], male    DOB: Nov 30, 1950, 61 y.o.   MRN: [id number]  HPI  Here for follow up  - reviewed interval history  R brain CVA 07/2012- weakness and numbness involving his left side. Hx TIA with similar presentation in May 2013.  on aspirin, changed to to Plavix 75 mg per day. Left facial weakness as well as left upper extremity weakness improved. Patient was not a TPA candidate secondary to recent major surgery and resolving deficits. S/p CIR inpt rehab and ongoing [facility]. ER visit 10/30 for numbness reviewed - no recurrence in past 4 days  dysphagia Worse with solids but also  Liquids Regurgitation after meals - pending GI eval 10/13/12 associated with hoarseness and sensation of mucus/gagging/cough -  working with ENT for same, s/p laryngeal surgery 07/2012 (1 week prior to CVA) Not relieved with syrup or antibiotics    Sciatica- left leg and hip pain with spasms History of similar symptoms with remote sciatica and ruptured lumbar disc Current pain symptoms not relieved with Lyrica or tramadol improved with 6 day pred pak and baclofen TID - pending ortho spine eval remains difficult to assess if strength affected because of recent right hemisphere CVA causing left hemiparesis and hemisensory deficits  Past Medical History  Diagnosis Date  . ALLERGIC RHINITIS   . CAD, NATIVE VESSEL     BMS to OM1 2001, DES to BMS 2005  . DIAB W/UNSPEC COMP TYPE II/UNSPEC TYPE UNCNTRL   . ERECTILE DYSFUNCTION   . HYPERLIPIDEMIA-MIXED   . MORTON'S NEUROMA, RIGHT   . SHOULDER PAIN, RIGHT   . HYPERTENSION, BENIGN   . Peripheral neuropathy   . Chronic back pain     herniated disc  . GERD   . Constipation     takes Carafate four times day  . Seasonal allergies     takes Allegra and Benadryl daily prn;uses Flonase daily  . TIA on medication 02/2012  . 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 Negative for fever.  Respiratory: Positive for cough and choking. Negative for shortness of breath.   Gastrointestinal: Positive for constipation. Negative for abdominal pain and abdominal distention.       See HPI  Musculoskeletal: Positive for myalgias and gait problem. Negative for back pain and joint swelling.  Neurological: Positive for tremors (LLE, worse at night and after exertion), weakness and numbness. Negative for dizziness, seizures, speech difficulty and headaches.  Psychiatric/Behavioral: Negative for behavioral problems and confusion.       Objective:   Physical Exam  BP 172/92  Pulse 84  Temp 98.3 F (36.8 C) (Oral)  Ht 5\' 11"  (1.803 m)  Wt 190 lb 1.9 oz (86.238 kg)  BMI 26.52 kg/m2  SpO2 97% Wt Readings from Last 3 Encounters:  09/25/12 190 lb 1.9 oz (86.238 kg)  09/13/12 197 lb (89.359 kg)  09/06/12 194 lb 7.1 oz (88.2 kg)   Constitutional:  He appears well-developed and well-nourished. No distress. Wife at side Neck: Normal range of motion. Neck supple. No JVD present. No thyromegaly present.  Cardiovascular: Normal rate, regular rhythm and normal heart sounds.  No murmur heard. no BLE edema Pulmonary/Chest: Effort normal and breath sounds normal. No respiratory distress. no wheezes.  Abdominal: Soft. Bowel sounds are normal. Patient exhibits no distension. There is no tenderness.  Neurologic: min  L facial paralysis, no significant dysarthria, no aphasia - LUE with mild hemiparesis MSkel: L hip with full ROM, nontender to groin palpation. Back: full range of motion of thoracic and lumbar spine. Non tender to palpation. Positive ipsilateral straight leg raise. DTR's are intact but hyperactive on left causing residual involuntary spasm of L hip flexors. patient ambulates with antalgic gait.  Skin - no bruise or rash Psychiatric: he has a normal mood and affect. behavior is normal. Judgment and thought  content normal.   Lab Results  Component Value Date   WBC 8.3 09/20/2012   HGB 13.2 09/20/2012   HCT 38.3* 09/20/2012   PLT 234 09/20/2012   GLUCOSE 105* 09/20/2012   CHOL 90 08/15/2012   TRIG 59 08/15/2012   HDL 52 08/15/2012   LDLDIRECT 112.2 02/25/2010   LDLCALC 26 08/15/2012   ALT 65* 08/21/2012   AST 54* 08/21/2012   NA 141 09/20/2012   K 3.4* 09/20/2012   CL 102 09/20/2012   CREATININE 1.32 09/20/2012   BUN 25* 09/20/2012   CO2 33* 09/20/2012   PSA 0.16 04/13/2010   INR 1.06 08/14/2012   HGBA1C 9.4* 08/15/2012       Assessment & Plan:   Dysphagia with regurgitation and weight loss, ongoing > 3 mo - Pending eval by GI 10/13/12 ([facility]) continue PPI BID and carafate  Vocal cord paralysis with "cough" -  s/p laryngoscopy 9/16 by ENT for same - mild persisting hoarseness Renewed narcotic cough medication to use as needed and continue with GI and ENT evaluation as ongoing  Left leg pain, involuntary spasms related to recent CVA 08/14/12.  also suspect element of overlapping lumbar radiculopathy with history of sciatica  - Improved s/p 6d Pred pack for inflammation last week -avoid additional steroids at this time because of coexisting diabetic exacerbation  Increase dose baclofen for muscle spasm and tramadol for pain Pending eval by spine specialist Sa[facility]o 09/25/12

## 2012-09-25 NOTE — Assessment & Plan Note (Signed)
changed coreg to low dose atenolol 03/2012 Add amlodipine now -erx done follow up cards as planned for additional titration BP Readings from Last 3 Encounters:  09/25/12 172/92  09/21/12 159/85  09/13/12 148/82

## 2012-09-25 NOTE — Assessment & Plan Note (Signed)
CVA 07/2012 -associated with left hemiparesis and numbness/paresthesia Following with neurology for same, continue medical management including risk factor control -see hypertension below Hx TIA 03/2012 with similar symptoms- hospitalization at that time unremarkable for CVA -  control of risk factors addressed and importance of compliance reviewed Changed Plavix from ASA 03/2012 hosp - continue same

## 2012-09-25 NOTE — Telephone Encounter (Signed)
Pt called requesting Rx for Prodigy test strips and lancets.

## 2012-09-25 NOTE — Patient Instructions (Signed)
It was good to see you today. We have reviewed your prior records including labs and tests today Start amlodipine once daily for blood pressure in addition to other blood pressure medications Use cough syrup as needed, refill provided Increase baclofen for muscle spasm 4 times daily as needed and tramadol for pain to 4 times daily as needed Your prescription(s) have been submitted to your pharmacy. Please take as directed and contact our office if you believe you are having problem(s) with the medication(s). Keep followup with other specialists as reviewed today: Dr. Sharlett Iles November 22, Dr. Maia Petties November 6, and Dr. Constance Holster as scheduled Please schedule followup in 3 months for blood pressure recheck, call sooner if problems.

## 2012-09-26 ENCOUNTER — Inpatient Hospital Stay: Payer: Medicare Other | Admitting: Physical Medicine & Rehabilitation

## 2012-09-27 ENCOUNTER — Telehealth: Payer: Self-pay | Admitting: *Deleted

## 2012-09-27 DIAGNOSIS — M79605 Pain in left leg: Secondary | ICD-10-CM

## 2012-09-27 DIAGNOSIS — G8929 Other chronic pain: Secondary | ICD-10-CM

## 2012-09-27 NOTE — Telephone Encounter (Signed)
Take half tablet (2.5 mg amlodipine) daily instead of whole tablet -call if problems

## 2012-09-27 NOTE — Telephone Encounter (Signed)
[facility] HHRN called and stated that pt started Norvasc 5mg  yesterday for BP and pt is now complaining of dizziness and nausea-HHRN is requesting med change.

## 2012-09-28 MED ORDER — MEGESTROL ACETATE 40 MG/ML PO SUSP: 200.0000 mg | Freq: Every day | ORAL | Status: DC

## 2012-09-28 MED ORDER — PRODIGY TWIST TOP LANCETS 28G MISC: Status: DC

## 2012-09-28 MED ORDER — GLUCOSE BLOOD VI STRP: ORAL_STRIP | Status: DC

## 2012-09-28 NOTE — Telephone Encounter (Signed)
1) megace - 1 tsp daily for appetite stimulation - ex done 2) new ortho refer done

## 2012-09-28 NOTE — Telephone Encounter (Signed)
[name] informed.

## 2012-09-28 NOTE — Telephone Encounter (Signed)
[facility] RN informed of MD's advisement . Pt's wife is concerned because pt is not eating and has no appetite, they are wondering if you can prescribe something to increase his appetite. Also, when pt went to orthopedic appointment yesterday, he was not seen by MD (reason unknown). They want to know if another referral can be placed for another orthopedic specialist.

## 2012-10-02 ENCOUNTER — Ambulatory Visit: Payer: Medicare Other | Admitting: Internal Medicine

## 2012-10-09 ENCOUNTER — Telehealth: Payer: Self-pay | Admitting: *Deleted

## 2012-10-09 NOTE — Telephone Encounter (Signed)
[name] with [name] is calling about Outstanding Face to Face paperwork. She is going to refax forms for Dr. To complete.

## 2012-10-10 ENCOUNTER — Encounter (HOSPITAL_COMMUNITY): Payer: Self-pay | Admitting: Emergency Medicine

## 2012-10-10 ENCOUNTER — Emergency Department (HOSPITAL_COMMUNITY): Payer: Medicare Other

## 2012-10-10 ENCOUNTER — Emergency Department (INDEPENDENT_AMBULATORY_CARE_PROVIDER_SITE_OTHER)
Admission: EM | Admit: 2012-10-10 | Discharge: 2012-10-10 | Disposition: A | Discharge status: Nursing Home (Includes ICF) | Payer: Medicare Other | Source: Home / Self Care | Attending: Family Medicine | Admitting: Family Medicine

## 2012-10-10 ENCOUNTER — Encounter (HOSPITAL_COMMUNITY): Payer: Self-pay | Admitting: *Deleted

## 2012-10-10 ENCOUNTER — Observation Stay (HOSPITAL_COMMUNITY)
Admission: EM | Admit: 2012-10-10 | Discharge: 2012-10-11 | Disposition: A | Discharge status: Home / Self Care | Payer: Medicare Other | Attending: Family Medicine | Admitting: Family Medicine

## 2012-10-10 DIAGNOSIS — R209 Unspecified disturbances of skin sensation: Secondary | ICD-10-CM

## 2012-10-10 DIAGNOSIS — I2510 Atherosclerotic heart disease of native coronary artery without angina pectoris: Secondary | ICD-10-CM | POA: Diagnosis present

## 2012-10-10 DIAGNOSIS — R079 Chest pain, unspecified: Secondary | ICD-10-CM

## 2012-10-10 DIAGNOSIS — G629 Polyneuropathy, unspecified: Secondary | ICD-10-CM | POA: Diagnosis present

## 2012-10-10 DIAGNOSIS — G5760 Lesion of plantar nerve, unspecified lower limb: Secondary | ICD-10-CM

## 2012-10-10 DIAGNOSIS — F528 Other sexual dysfunction not due to a substance or known physiological condition: Secondary | ICD-10-CM

## 2012-10-10 DIAGNOSIS — G8194 Hemiplegia, unspecified affecting left nondominant side: Secondary | ICD-10-CM

## 2012-10-10 DIAGNOSIS — K5900 Constipation, unspecified: Secondary | ICD-10-CM

## 2012-10-10 DIAGNOSIS — K219 Gastro-esophageal reflux disease without esophagitis: Secondary | ICD-10-CM

## 2012-10-10 DIAGNOSIS — I6340 Cerebral infarction due to embolism of unspecified cerebral artery: Secondary | ICD-10-CM

## 2012-10-10 DIAGNOSIS — I69391 Dysphagia following cerebral infarction: Secondary | ICD-10-CM

## 2012-10-10 DIAGNOSIS — I6350 Cerebral infarction due to unspecified occlusion or stenosis of unspecified cerebral artery: Secondary | ICD-10-CM

## 2012-10-10 DIAGNOSIS — R4182 Altered mental status, unspecified: Secondary | ICD-10-CM | POA: Insufficient documentation

## 2012-10-10 DIAGNOSIS — R200 Anesthesia of skin: Secondary | ICD-10-CM | POA: Diagnosis present

## 2012-10-10 DIAGNOSIS — I69354 Hemiplegia and hemiparesis following cerebral infarction affecting left non-dominant side: Secondary | ICD-10-CM

## 2012-10-10 DIAGNOSIS — G8929 Other chronic pain: Secondary | ICD-10-CM | POA: Diagnosis present

## 2012-10-10 DIAGNOSIS — E785 Hyperlipidemia, unspecified: Secondary | ICD-10-CM

## 2012-10-10 DIAGNOSIS — M549 Dorsalgia, unspecified: Secondary | ICD-10-CM | POA: Insufficient documentation

## 2012-10-10 DIAGNOSIS — E119 Type 2 diabetes mellitus without complications: Secondary | ICD-10-CM

## 2012-10-10 DIAGNOSIS — R1310 Dysphagia, unspecified: Secondary | ICD-10-CM

## 2012-10-10 DIAGNOSIS — IMO0001 Reserved for inherently not codable concepts without codable children: Secondary | ICD-10-CM

## 2012-10-10 DIAGNOSIS — I10 Essential (primary) hypertension: Secondary | ICD-10-CM

## 2012-10-10 DIAGNOSIS — Z87891 Personal history of nicotine dependence: Secondary | ICD-10-CM

## 2012-10-10 DIAGNOSIS — I69959 Hemiplegia and hemiparesis following unspecified cerebrovascular disease affecting unspecified side: Secondary | ICD-10-CM | POA: Insufficient documentation

## 2012-10-10 DIAGNOSIS — Z0000 Encounter for general adult medical examination without abnormal findings: Secondary | ICD-10-CM

## 2012-10-10 DIAGNOSIS — Z9861 Coronary angioplasty status: Secondary | ICD-10-CM | POA: Diagnosis present

## 2012-10-10 DIAGNOSIS — E1149 Type 2 diabetes mellitus with other diabetic neurological complication: Secondary | ICD-10-CM | POA: Insufficient documentation

## 2012-10-10 DIAGNOSIS — G459 Transient cerebral ischemic attack, unspecified: Secondary | ICD-10-CM

## 2012-10-10 DIAGNOSIS — R51 Headache: Secondary | ICD-10-CM

## 2012-10-10 DIAGNOSIS — I152 Hypertension secondary to endocrine disorders: Secondary | ICD-10-CM | POA: Diagnosis present

## 2012-10-10 DIAGNOSIS — E1142 Type 2 diabetes mellitus with diabetic polyneuropathy: Secondary | ICD-10-CM | POA: Insufficient documentation

## 2012-10-10 DIAGNOSIS — J309 Allergic rhinitis, unspecified: Secondary | ICD-10-CM

## 2012-10-10 DIAGNOSIS — M25519 Pain in unspecified shoulder: Secondary | ICD-10-CM

## 2012-10-10 DIAGNOSIS — I639 Cerebral infarction, unspecified: Secondary | ICD-10-CM | POA: Diagnosis present

## 2012-10-10 DIAGNOSIS — Z794 Long term (current) use of insulin: Secondary | ICD-10-CM | POA: Insufficient documentation

## 2012-10-10 LAB — COMPREHENSIVE METABOLIC PANEL
ALT: 30 U/L (ref 0–53)
AST: 25 U/L (ref 0–37)
Albumin: 3.7 g/dL (ref 3.5–5.2)
Alkaline Phosphatase: 57 U/L (ref 39–117)
BUN: 19 mg/dL (ref 6–23)
CO2: 26 mEq/L (ref 19–32)
Calcium: 9.4 mg/dL (ref 8.4–10.5)
Chloride: 103 mEq/L (ref 96–112)
Creatinine, Ser: 1.23 mg/dL (ref 0.50–1.35)
GFR calc Af Amer: 71 mL/min — ABNORMAL LOW (ref >90)
GFR calc non Af Amer: 61 mL/min — ABNORMAL LOW (ref >90)
Glucose, Bld: 80 mg/dL (ref 70–99)
Potassium: 3.3 mEq/L — ABNORMAL LOW (ref 3.5–5.1)
Sodium: 138 mEq/L (ref 135–145)
Total Bilirubin: 0.6 mg/dL (ref 0.3–1.2)
Total Protein: 7.3 g/dL (ref 6.0–8.3)

## 2012-10-10 LAB — GLUCOSE, CAPILLARY: Glucose-Capillary: 68 mg/dL — ABNORMAL LOW (ref 70–99)

## 2012-10-10 LAB — CBC
HCT: 35.2 % — ABNORMAL LOW (ref 39.0–52.0)
Hemoglobin: 12.4 g/dL — ABNORMAL LOW (ref 13.0–17.0)
MCH: 30.1 pg (ref 26.0–34.0)
MCHC: 35.2 g/dL (ref 30.0–36.0)
MCV: 85.4 fL (ref 78.0–100.0)
Platelets: 200 10*3/uL (ref 150–400)
RBC: 4.12 MIL/uL — ABNORMAL LOW (ref 4.22–5.81)
RDW: 12.2 % (ref 11.5–15.5)
WBC: 6.1 10*3/uL (ref 4.0–10.5)

## 2012-10-10 LAB — POCT I-STAT TROPONIN I: Troponin i, poc: 0 ng/mL (ref 0.00–0.08)

## 2012-10-10 MED ORDER — SODIUM CHLORIDE 0.9 % IV SOLN
Freq: Once | INTRAVENOUS | Status: AC
  Administered 2012-10-10: 21:00:00 via INTRAVENOUS

## 2012-10-10 MED ORDER — ONDANSETRON HCL 4 MG/2ML IJ SOLN
4.0000 mg | Freq: Once | INTRAMUSCULAR | Status: AC
  Administered 2012-10-10: 4 mg via INTRAVENOUS
  Filled 2012-10-10: qty 2

## 2012-10-10 MED ORDER — ASPIRIN 81 MG PO CHEW
324.0000 mg | CHEWABLE_TABLET | Freq: Once | ORAL | Status: AC
  Administered 2012-10-10: 324 mg via ORAL
  Filled 2012-10-10: qty 4

## 2012-10-10 MED ORDER — MORPHINE SULFATE 4 MG/ML IJ SOLN
4.0000 mg | Freq: Once | INTRAMUSCULAR | Status: AC
  Administered 2012-10-10: 4 mg via INTRAVENOUS
  Filled 2012-10-10: qty 1

## 2012-10-10 NOTE — ED Notes (Signed)
ems contacted for transport

## 2012-10-10 NOTE — H&P (Signed)
PCP:   [name], MD   Chief Complaint:  Left sided numbness  HPI: 61 yo male cva rith left sided hemiparesis from cva in 9/13 since that time has been having issues with left sided muscle spasms and "nerve pain" being managed by neruology as outpt.  Recently started on topirimate and pt says this medicine has made his pain much worse and is more weak on left side.  He also noted some cp that he says are spasms and we were called by edp to admit the patient because of his cp.  There is no new slurred speech or new focal neuro def except the worse wekaness in his left upper and lower ext.  No fevers.    Review of Systems:  O/w neg  Past Medical History: Past Medical History  Diagnosis Date  . ALLERGIC RHINITIS   . CAD, NATIVE VESSEL     BMS to OM1 2001, DES to BMS 2005  . DIAB W/UNSPEC COMP TYPE II/UNSPEC TYPE UNCNTRL   . ERECTILE DYSFUNCTION   . HYPERLIPIDEMIA-MIXED   . MORTON'S NEUROMA, RIGHT   . SHOULDER PAIN, RIGHT   . HYPERTENSION, BENIGN   . Peripheral neuropathy   . Chronic back pain     herniated disc  . GERD   . Constipation     takes Carafate four times day  . Seasonal allergies     takes Allegra and Benadryl daily prn;uses Flonase daily  . TIA on medication 02/2012  . 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 Stent 2001, 2004    coronary stents  . Angioplasty   . Left knee surgury     x 2   . Dental surgery   . Coronary angioplasty 2005    2 stents  . Colonoscopy   . Laryngoplasty 08/07/2012    Procedure: LARYNGOPLASTY;  Surgeon: Izora Gala, MD;  Location: [facility];  Service: ENT;  Laterality: Left;  Left Vocal Cord Medialyzation    Medications: Prior to Admission medications   Medication Sig Start Date End Date Taking? Authorizing Provider  amLODipine (NORVASC) 5 MG tablet Take 5 mg by mouth daily.  09/27/12  Yes Rowe Clack, MD  atenolol (TENORMIN) 25 MG tablet Take 25 mg by mouth  every morning.    Yes Historical Provider, MD  baclofen (LIORESAL) 10 MG tablet Take 1 tablet (10 mg total) by mouth 4 (four) times daily. 09/25/12  Yes Rowe Clack, MD  carbamazepine (TEGRETOL) 200 MG tablet Take 100-200 mg by mouth 2 (two) times daily. Take 1/2 tablet (100 mg)  Twice daily for 2 weeks starting 10/09/2012, then take 1 tablet (200 mg) twice daily   Yes Historical Provider, MD  clopidogrel (PLAVIX) 75 MG tablet Take 75 mg by mouth every evening.    Yes Historical Provider, MD  diclofenac sodium (VOLTAREN) 1 % GEL Apply 2 g topically 4 (four) times daily. Apply to left knee and left ankle. 09/07/12  Yes Ivan Anchors Love, PA  diclofenac sodium (VOLTAREN) 1 % GEL Apply 2 g topically 4 (four) times daily as needed. For pain: left knee and left ankle   Yes Historical Provider, MD  feeding supplement (GLUCERNA SHAKE) LIQD Take 237 mLs by mouth 3 (three) times daily between meals. 09/13/12  Yes Rowe Clack, MD  fexofenadine (ALLEGRA) 180 MG tablet Take 180 mg by mouth daily as needed. For allergies   Yes  Historical Provider, MD  fluticasone (FLONASE) 50 MCG/ACT nasal spray Place 2 sprays into the nose daily as needed. For dry nasal passages 07/21/12 07/21/13 Yes Rowe Clack, MD  hydrochlorothiazide (MICROZIDE) 12.5 MG capsule Take 12.5 mg by mouth every morning. 05/11/12 05/11/13 Yes Rowe Clack, MD  insulin aspart (NOVOLOG) 100 UNIT/ML injection Inject 5 Units into the skin 3 (three) times daily before meals.   Yes Historical Provider, MD  insulin glargine (LANTUS) 100 UNIT/ML injection Inject 5 Units into the skin every morning.   Yes Historical Provider, MD  Menthol, Topical Analgesic, (BIOFREEZE EX) Apply 1 application topically 3 (three) times daily as needed. For pain   Yes Historical Provider, MD  nitroGLYCERIN (NITROSTAT) 0.4 MG SL tablet Place 0.4 mg under the tongue every 5 (five) minutes as needed. For chest pain   Yes Historical Provider, MD  pantoprazole  (PROTONIX) 40 MG tablet Take 1 tablet (40 mg total) by mouth 2 (two) times daily. 07/21/12 07/21/13 Yes Rowe Clack, MD  polyethylene glycol (MIRALAX / GLYCOLAX) packet Take 17 g by mouth daily. For constipation 09/07/12  Yes Ivan Anchors Love, PA  potassium chloride (K-DUR,KLOR-CON) 10 MEQ tablet Take 1 tablet (10 mEq total) by mouth 2 (two) times daily. 09/07/12  Yes Ivan Anchors Love, PA  pravastatin (PRAVACHOL) 40 MG tablet Take 40 mg by mouth at bedtime.  07/12/12  Yes Historical Provider, MD  senna-docusate (SENOKOT-S) 8.6-50 MG per tablet Take 2 tablets by mouth 2 (two) times daily. For constipation. 09/07/12  Yes Ivan Anchors Love, PA  traMADol (ULTRAM) 50 MG tablet Take 1 tablet (50 mg total) by mouth every 6 (six) hours as needed for pain. For pain. 09/25/12  Yes Rowe Clack, MD    Allergies:   Allergies  Allergen Reactions  . Penicillins Anaphylaxis  . Pneumococcal Vaccines Anaphylaxis  . Shellfish Allergy Anaphylaxis  . Gabapentin     Urinary difficulties  . Lisinopril     cough  . Topiramate Other (See Comments)    Chest spasms and numbness    Social History:  reports that he quit smoking about 29 years ago. He has never used smokeless tobacco. He reports that he does not drink alcohol or use illicit drugs.  Family History: Family History  Problem Relation Age of Onset  . Stroke      Physical Exam: Filed Vitals:   10/10/12 2110  BP: 139/75  Pulse: 83  Temp: 98.1 F (36.7 C)  TempSrc: Oral  Resp: 14  SpO2: 100%   General appearance: alert, cooperative and no distress Neck: no JVD and supple, symmetrical, trachea midline Lungs: clear to auscultation bilaterally Heart: regular rate and rhythm, S1, S2 normal, no murmur, click, rub or gallop reproducible cp with palp along right anterior cw Abdomen: soft, non-tender; bowel sounds normal; no masses,  no organomegaly Extremities: extremities normal, atraumatic, no cyanosis or edema Pulses: 2+ and symmetric Skin:  Skin color, texture, turgor normal. No rashes or lesions Neurologic: Cranial nerves: normal 4/5 strength left upp and lower ext.  Normal on right.      Labs on Admission:   [facility] 10/10/12 2147  NA 138  K 3.3*  CL 103  CO2 26  GLUCOSE 80  BUN 19  CREATININE 1.23  CALCIUM 9.4  MG --  PHOS --    Basename 10/10/12 2147  AST 25  ALT 30  ALKPHOS 57  BILITOT 0.6  PROT 7.3  ALBUMIN 3.7    Basename 10/10/12 2147  WBC 6.1  NEUTROABS --  HGB 12.4*  HCT 35.2*  MCV 85.4  PLT 200    Radiological Exams on Admission: Ct Head Wo Contrast  10/10/2012  *RADIOLOGY REPORT*  Clinical Data: Left side numbness, history of stroke with left side paralysis  CT HEAD WITHOUT CONTRAST  Technique:  Contiguous axial images were obtained from the base of the skull through the vertex without contrast.  Comparison: 09/20/2012  Findings: Normal ventricular morphology. No midline shift or mass effect. Small vessel chronic ischemic changes of deep cerebral white matter. Old infarct identified at posterior right internal capsule and right thalamus. No intracranial hemorrhage, mass lesion, or evidence of acute infarction. No extra-axial fluid collections. Bones and sinuses unremarkable.  IMPRESSION: Small vessel chronic ischemic changes of deep cerebral white matter. Old infarct posterior limb right internal capsule and right thalamus. No definite acute intracranial abnormalities.   Original Report Authenticated By: Lavonia Dana, M.D.     Dg Chest Portable 1 View  10/10/2012  *RADIOLOGY REPORT*  Clinical Data: Numbness  PORTABLE CHEST - 1 VIEW  Comparison: 08/14/2012  Findings: Heart size upper normal to mildly enlarged.  Mediastinal contours otherwise within normal range.  Lungs are predominately clear.  No pleural effusion or pneumothorax.  No acute osseous finding.  IMPRESSION: Heart size upper normal to mildly enlarged.  No acute process identified.   Original Report Authenticated By: Carlos Levering,  M.D.     Assessment/Plan 61 yo male with left sided hemiparesis wth parashesia seems to be worse along with atyp cp  Principal Problem:  *Chest pain Active Problems:  HYPERTENSION, BENIGN  CAD, NATIVE VESSEL  Chronic back pain  Peripheral neuropathy  CVA (cerebral infarction) old  Left sided numbness  Hemiparesis affecting left side as late effect of stroke  Neurology has been consulted concerning further w/u and adjustment of his  meds if needed.  On plavix cont.  romi and ck 2d echo in am but doubt acs.  Stress done 5/13 showed global hypokenesis but to reversible evidence of ichemia ef 47%.  Echo 9/13 ef 50%.  Zaynah Chawla A 10/10/2012, 11:59 PM

## 2012-10-10 NOTE — ED Provider Notes (Signed)
I saw and evaluated the patient, reviewed the resident's note and I agree with the findings and plan.  Agree with EKG interpretation if present.   Pt with chronic but worsening L sided pain and paresthesias related to prior stroke and/or lumbar radiculopathy, seen by PCP and Neuro, multiple medication adjustments which seem to be making the symptoms worse. Also having chest tightness and SOB which is new.   Charles B. Karle Starch, MD 10/10/12 [phone]

## 2012-10-10 NOTE — ED Notes (Addendum)
Pt is s/p CVA 08/14/2012 and he had been improving at home with OT and PT.    He went to Dr Leonie Man 10/14/2012 and c/o left hip pain and started taking topiramate.  After 3 days he noticed numbness on his left side of his body.   Today he felt like "electricity was flowing' ON HIS LEFT SIDE    Pt speaking in complete sentences. Alert.  Also c/o chest tightness. Skin w/d

## 2012-10-10 NOTE — ED Provider Notes (Signed)
History     CSN: [id number]  Arrival date & time 10/10/12  2113   First MD Initiated Contact with Patient 10/10/12 2151      Chief Complaint  Patient presents with  . Numbness    (Consider location/radiation/quality/duration/timing/severity/associated sxs/prior treatment) HPI Pt here with complain to left leg pain/numbness and chest pain. States the left leg pain/numbness has been gradually worsening over the past three days. Described as "electric" pain, 10/10 pain intensity. Feels like it has gotten worse with new medication changes. Pt has been seeing a neurologist for this. Also, complains of chest pain today located on left chest with radiation to left arm and left neck. Described as tightness of moderate severity at onset and currently describes it as having no pain. Associated with nausea, diaphoresis. Denies SOB. Prior to presenting to ED pt went to urgent care.    Past Medical History  Diagnosis Date  . ALLERGIC RHINITIS   . CAD, NATIVE VESSEL     BMS to OM1 2001, DES to BMS 2005  . DIAB W/UNSPEC COMP TYPE II/UNSPEC TYPE UNCNTRL   . ERECTILE DYSFUNCTION   . HYPERLIPIDEMIA-MIXED   . MORTON'S NEUROMA, RIGHT   . SHOULDER PAIN, RIGHT   . HYPERTENSION, BENIGN   . Peripheral neuropathy   . Chronic back pain     herniated disc  . GERD   . Constipation     takes Carafate four times day  . Seasonal allergies     takes Allegra and Benadryl daily prn;uses Flonase daily  . TIA on medication 02/2012  . 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 Stent 2001, 2004    coronary stents  . Angioplasty   . Left knee surgury     x 2   . Dental surgery   . Coronary angioplasty 2005    2 stents  . Colonoscopy   . Laryngoplasty 08/07/2012    Procedure: LARYNGOPLASTY;  Surgeon: Izora Gala, MD;  Location: [facility] OR;  Service: ENT;  Laterality: Left;  Left Vocal Cord Medialyzation    Family History  Problem Relation Age of  Onset  . Stroke      History  Substance Use Topics  . Smoking status: Former Smoker    Quit date: 02/24/1983  . Smokeless tobacco: Never Used     Comment: Married, lives in [place] with wife. He is disabled secondary to back pain. 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 Alcohol Use: No      Review of Systems Constitutional: Negative for fever.  Eyes: Negative for vision loss.  ENT: Negative for difficulty swallowing.  Cardiovascular: Negative for chest pain. Respiratory: Negative for respiratory distress.  Gastrointestinal:  Negative for vomiting.  Genitourinary: Negative for inability to void.  Musculoskeletal: Positive for gait problem.  Integumentary: Negative for rash.  Neurological: negative for AMS.      Allergies  Penicillins; Pneumococcal vaccines; Shellfish allergy; Gabapentin; Lisinopril; and Topiramate  Home Medications   Current Outpatient Rx  Name  Route  Sig  Dispense  Refill  . AMLODIPINE BESYLATE 5 MG PO TABS   Oral   Take 0.5 tablets (2.5 mg total) by mouth daily.   30 tablet   11   . ATENOLOL 25 MG PO TABS   Oral   Take 25 mg by mouth every morning.          Marland Kitchen BACLOFEN 10 MG PO  TABS   Oral   Take 1 tablet (10 mg total) by mouth 4 (four) times daily.   40 tablet   2   . CLOPIDOGREL BISULFATE 75 MG PO TABS   Oral   Take 75 mg by mouth every evening.          Marland Kitchen DICLOFENAC SODIUM 1 % TD GEL   Topical   Apply 2 g topically 4 (four) times daily. Apply to left knee and left ankle.   4 Tube   1   . DIPHENHYDRAMINE HCL 25 MG PO TABS   Oral   Take 25 mg by mouth every 6 (six) hours as needed. For allergies         . GLUCERNA SHAKE PO LIQD   Oral   Take 237 mLs by mouth 3 (three) times daily between meals.   30 Can   0   . FEXOFENADINE HCL 180 MG PO TABS   Oral   Take 180 mg by mouth daily as needed. For allergies         . FLUTICASONE PROPIONATE 50 MCG/ACT NA SUSP   Nasal   Place 2 sprays into the nose daily as needed. For  dry nasal passages         . GLUCOSE BLOOD VI STRP      Use as instructed once daily Dx code 250.02   100 each   3   . HYDROCHLOROTHIAZIDE 12.5 MG PO CAPS   Oral   Take 12.5 mg by mouth every morning.         . INSULIN ASPART 100 UNIT/ML [place] SOLN      5 units with meals three times a day   1 vial   1   . INSULIN GLARGINE 100 UNIT/ML  SOLN   Subcutaneous   Inject 15 Units into the skin every morning.         . MEGESTROL ACETATE 40 MG/ML PO SUSP   Oral   Take 5 mLs (200 mg total) by mouth daily.   240 mL   0   . MUSCLE RUB 10-15 % EX CREA   Topical   Apply 1 application topically 3 (three) times daily as needed. For achy muscles         . NITROGLYCERIN 0.4 MG SL SUBL   Sublingual   Place 0.4 mg under the tongue every 5 (five) minutes as needed. For chest pain         . PANTOPRAZOLE SODIUM 40 MG PO TBEC   Oral   Take 1 tablet (40 mg total) by mouth 2 (two) times daily.   60 tablet   3   . POLYETHYLENE GLYCOL 3350 PO PACK   Oral   Take 17 g by mouth daily. For constipation   14 each      . POTASSIUM CHLORIDE CRYS ER 10 MEQ PO TBCR   Oral   Take 1 tablet (10 mEq total) by mouth 2 (two) times daily.   60 tablet   1   . PRAVASTATIN SODIUM 40 MG PO TABS   Oral   Take 40 mg by mouth at bedtime.          Marland Kitchen PRODIGY TWIST TOP LANCETS 28G MISC      Use as instructed once daily Dx code 250.02   100 each   3   . SENNOSIDES-DOCUSATE SODIUM 8.6-50 MG PO TABS   Oral   Take 2 tablets by mouth 2 (two) times daily. For  constipation.         . TRAMADOL HCL 50 MG PO TABS   Oral   Take 1 tablet (50 mg total) by mouth every 6 (six) hours as needed for pain. For pain.   100 tablet   1     BP 139/75  Pulse 83  Temp 98.1 F (36.7 C) (Oral)  Resp 14  SpO2 100%  Physical Exam Nursing note and vitals reviewed.  Constitutional: Pt is alert and appears stated age. Eyes: No injection, no scleral icterus. HENT: Atraumatic, airway open without  erythema or exudate.  Respiratory: No respiratory distress. Equal breathing bilaterally. Cardiovascular: Normal rate. Extremities warm and well perfused.  Abdomen: Soft, non-tender. MSK: Extremities are atraumatic without deformity. Skin: No rash, no wounds.   Neuro: GCS 15. CN II-XII grossly intact. Sensation in tact. Strength decreased in left lower extremity.      ED Course  Procedures (including critical care time)  Labs Reviewed  CBC - Abnormal; Notable for the following:    RBC 4.12 (*)     Hemoglobin 12.4 (*)     HCT 35.2 (*)     All other components within normal limits  COMPREHENSIVE METABOLIC PANEL - Abnormal; Notable for the following:    Potassium 3.3 (*)     GFR calc non Af Amer 61 (*)     GFR calc Af Amer 71 (*)     All other components within normal limits  POCT I-STAT TROPONIN I   Ct Head Wo Contrast  10/10/2012  *RADIOLOGY REPORT*  Clinical Data: Left side numbness, history of stroke with left side paralysis  CT HEAD WITHOUT CONTRAST  Technique:  Contiguous axial images were obtained from the base of the skull through the vertex without contrast.  Comparison: 09/20/2012  Findings: Normal ventricular morphology. No midline shift or mass effect. Small vessel chronic ischemic changes of deep cerebral white matter. Old infarct identified at posterior right internal capsule and right thalamus. No intracranial hemorrhage, mass lesion, or evidence of acute infarction. No extra-axial fluid collections. Bones and sinuses unremarkable.  IMPRESSION: Small vessel chronic ischemic changes of deep cerebral white matter. Old infarct posterior limb right internal capsule and right thalamus. No definite acute intracranial abnormalities.   Original Report Authenticated By: Lavonia Dana, M.D.    Dg Chest Portable 1 View  10/10/2012  *RADIOLOGY REPORT*  Clinical Data: Numbness  PORTABLE CHEST - 1 VIEW  Comparison: 08/14/2012  Findings: Heart size upper normal to mildly enlarged.   Mediastinal contours otherwise within normal range.  Lungs are predominately clear.  No pleural effusion or pneumothorax.  No acute osseous finding.  IMPRESSION: Heart size upper normal to mildly enlarged.  No acute process identified.   Original Report Authenticated By: Carlos Levering, M.D.      1. Left sided numbness   2. Chest pain       MDM  61 y.o. male w/ pertinent PMHx of CAD s/p stent x2, CVA with residual left side weakenss presents w/ chest pain, left leg pain/numbness. Here pt appears in NAD and has unremarkable vital signs. Chest pain resolved now but am concerned about ACS given history of CAD. Doubt dissection, PE. Will work up and recommend admission even if all unremarkable. Will treat left leg pain and obtain head CT.   EKG with rate of 79, sinus rhythm, no interval changes, no axis deviation, no ST segment changes. CXR without acute abnormality. Troponin low. CBC unremarkable. CmP with slightly decreased K, elevated cr.  CT head without acute event. Called hospitalist for admission. Asked to consult neurology and called. They will come see patient.       I independently viewed, interpreted, and used in my medical decision making all ordered lab and imaging tests. Medical Decision Making discussed with ED attending Juanda Crumble B. Karle Starch, MD          Dayton Scrape, MD 10/11/12 Dyann Kief

## 2012-10-10 NOTE — ED Provider Notes (Signed)
History     CSN: [id number]  Arrival date & time 10/10/12  1955   None     Chief Complaint  Patient presents with  . Numbness    (Consider location/radiation/quality/duration/timing/severity/associated sxs/prior treatment) The history is provided by the patient and the spouse.  [name] is a 61 y.o. male with recent past medical history of ischemic stroke on 9/23/3 who presents today with complaints of persistent left sided numbness that has gotten progressively worse in the last hours associated with left chest pain.  At its most intense, the chest pain is moderate.  Currently states pain is 4/10.  The quality of the pain is described as dull. The pain does not radiate. Exacerbated by: nothing. He has tried nothing for the symptoms. Was evaluated in Dr. Sethi-neurologist office this week and was placed on topiramate for joint pain and paresthesias.  Called neurology office today and was instructed to go to UC for further evaluation and treatment.    Past Medical History  Diagnosis Date  . ALLERGIC RHINITIS   . CAD, NATIVE VESSEL     BMS to OM1 2001, DES to BMS 2005  . DIAB W/UNSPEC COMP TYPE II/UNSPEC TYPE UNCNTRL   . ERECTILE DYSFUNCTION   . HYPERLIPIDEMIA-MIXED   . MORTON'S NEUROMA, RIGHT   . SHOULDER PAIN, RIGHT   . HYPERTENSION, BENIGN   . Peripheral neuropathy   . Chronic back pain     herniated disc  . GERD   . Constipation     takes Carafate four times day  . Seasonal allergies     takes Allegra and Benadryl daily prn;uses Flonase daily  . TIA on medication 02/2012  . 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 Stent 2001, 2004    coronary stents  . Angioplasty   . Left knee surgury     x 2   . Dental surgery   . Coronary angioplasty 2005    2 stents  . Colonoscopy   . Laryngoplasty 08/07/2012    Procedure: LARYNGOPLASTY;  Surgeon: Izora Gala, MD;  Location: [facility];  Service: ENT;   Laterality: Left;  Left Vocal Cord Medialyzation    Family History  Problem Relation Age of Onset  . Stroke      History  Substance Use Topics  . Smoking status: Former Smoker    Quit date: 02/24/1983  . Smokeless tobacco: Never Used     Comment: Married, lives in [place] with wife. He is disabled secondary to back pain. 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 Alcohol Use: No      Review of Systems  All other systems reviewed and are negative.    Allergies  Penicillins; Pneumococcal vaccines; Shellfish allergy; Gabapentin; Lisinopril; and Topiramate  Home Medications   Current Outpatient Rx  Name  Route  Sig  Dispense  Refill  . AMLODIPINE BESYLATE 5 MG PO TABS   Oral   Take 5 mg by mouth daily.    30 tablet   11   . ATENOLOL 25 MG PO TABS   Oral   Take 25 mg by mouth every morning.          Marland Kitchen BACLOFEN 10 MG PO TABS   Oral   Take 1 tablet (10 mg total) by mouth 4 (four) times daily.   40 tablet   2   . CLOPIDOGREL BISULFATE 75 MG PO  TABS   Oral   Take 75 mg by mouth every evening.          Marland Kitchen GLUCERNA SHAKE PO LIQD   Oral   Take 237 mLs by mouth 3 (three) times daily between meals.   30 Can   0   . FEXOFENADINE HCL 180 MG PO TABS   Oral   Take 180 mg by mouth daily as needed. For allergies         . HYDROCHLOROTHIAZIDE 12.5 MG PO CAPS   Oral   Take 12.5 mg by mouth every morning.         Marland Kitchen NITROGLYCERIN 0.4 MG SL SUBL   Sublingual   Place 0.4 mg under the tongue every 5 (five) minutes as needed. For chest pain         . PANTOPRAZOLE SODIUM 40 MG PO TBEC   Oral   Take 1 tablet (40 mg total) by mouth 2 (two) times daily.   60 tablet   3   . POLYETHYLENE GLYCOL 3350 PO PACK   Oral   Take 17 g by mouth daily. For constipation   14 each      . POTASSIUM CHLORIDE CRYS ER 10 MEQ PO TBCR   Oral   Take 1 tablet (10 mEq total) by mouth 2 (two) times daily.   60 tablet   1   . PRAVASTATIN SODIUM 40 MG PO TABS   Oral   Take 40 mg  by mouth at bedtime.          Orlie Dakin SODIUM 8.6-50 MG PO TABS   Oral   Take 2 tablets by mouth 2 (two) times daily. For constipation.         Marland Kitchen DICLOFENAC SODIUM 1 % TD GEL   Topical   Apply 2 g topically 4 (four) times daily as needed. For pain: left knee and left ankle         . HYDROCODONE-ACETAMINOPHEN 5-500 MG PO TABS   Oral   Take 1 tablet by mouth every 6 (six) hours as needed for pain.   30 tablet   0   . INSULIN ASPART 100 UNIT/ML [place] SOLN   Subcutaneous   Inject 5 Units into the skin 3 (three) times daily before meals.         . INSULIN GLARGINE 100 UNIT/ML [place] SOLN   Subcutaneous   Inject 5 Units into the skin every morning.         Marland Kitchen BIOFREEZE EX   Apply externally   Apply 1 application topically 3 (three) times daily as needed. For pain         . PREGABALIN 100 MG PO CAPS   Oral   Take 1 capsule (100 mg total) by mouth 3 (three) times daily.   90 capsule   0   . TRAMADOL HCL 50 MG PO TABS   Oral   Take 1 tablet (50 mg total) by mouth every 6 (six) hours as needed for pain. For pain.  DO NOT TAKE IN COMBINATION WITH VICODIN   100 tablet   1     BP 146/75  Pulse 79  Temp 98.8 F (37.1 C) (Oral)  Resp 18  SpO2 100%  Physical Exam  Nursing note and vitals reviewed. Constitutional: He is oriented to person, place, and time. Vital signs are normal. He appears well-developed and well-nourished. He is active and cooperative.  HENT:  Head: Normocephalic.  Right Ear: External ear normal.  Left Ear: External ear normal.  Mouth/Throat: Oropharynx is clear and moist. No oropharyngeal exudate.  Eyes: Conjunctivae normal and EOM are normal. Pupils are equal, round, and reactive to light. No scleral icterus.  Neck: Trachea normal and normal range of motion. Neck supple. No thyromegaly present.  Cardiovascular: Normal rate, regular rhythm, normal heart sounds and intact distal pulses.   Pulmonary/Chest: Effort normal and breath sounds  normal.  Abdominal: Soft. Bowel sounds are normal. There is no tenderness.  Musculoskeletal: Normal range of motion.       Decreased sensation noted on left face, left arm and left lower extremity.  Lymphadenopathy:    He has no cervical adenopathy.  Neurological: He is alert and oriented to person, place, and time. He has normal strength. A sensory deficit is present. No cranial nerve deficit. GCS eye subscore is 4. GCS verbal subscore is 5. GCS motor subscore is 6.  Skin: Skin is warm and dry.  Psychiatric: He has a normal mood and affect. His speech is normal and behavior is normal. Judgment and thought content normal. Cognition and memory are normal.    ED Course  Procedures (including critical care time)  Labs Reviewed  GLUCOSE, CAPILLARY - Abnormal; Notable for the following:    Glucose-Capillary 68 (*)     All other components within normal limits  LAB REPORT - SCANNED   No results found.   1. Chest pain   2. Numbness on left side       MDM  Pt reports numbness in left side is a new finding.  Pt discussed with Dr. Gardiner Rhyme, given recent stroke history and past medical history, pt requires further evaluation and management that can not be provided at this facility.  Transfer to [facility] via carelink.  O2, cardiac monitoring, and PIV prior to departure.          Awilda Metro, NP 10/13/12 1201

## 2012-10-10 NOTE — ED Notes (Signed)
Pt placed on heart monitor and on 2lpm by nasal canula

## 2012-10-10 NOTE — ED Notes (Signed)
To ED via EMS from [facility], pt had CVA in September, has had several med changes in the past week and c/o pain to right hip, leg and groin, also reports left sided CP and left arm pain, denies SOB, dizzyness

## 2012-10-10 NOTE — ED Notes (Signed)
Care link not available   [facility] called for transport

## 2012-10-10 NOTE — ED Notes (Signed)
Cardiac monitor showing NSR with no ectopics,   Rate 74

## 2012-10-11 ENCOUNTER — Observation Stay (HOSPITAL_COMMUNITY): Payer: Medicare Other

## 2012-10-11 ENCOUNTER — Telehealth: Payer: Self-pay

## 2012-10-11 DIAGNOSIS — R1310 Dysphagia, unspecified: Secondary | ICD-10-CM

## 2012-10-11 DIAGNOSIS — I6340 Cerebral infarction due to embolism of unspecified cerebral artery: Secondary | ICD-10-CM

## 2012-10-11 DIAGNOSIS — I2510 Atherosclerotic heart disease of native coronary artery without angina pectoris: Secondary | ICD-10-CM

## 2012-10-11 DIAGNOSIS — R072 Precordial pain: Secondary | ICD-10-CM

## 2012-10-11 DIAGNOSIS — I10 Essential (primary) hypertension: Secondary | ICD-10-CM

## 2012-10-11 LAB — GLUCOSE, CAPILLARY
Glucose-Capillary: 103 mg/dL — ABNORMAL HIGH (ref 70–99)
Glucose-Capillary: 109 mg/dL — ABNORMAL HIGH (ref 70–99)
Glucose-Capillary: 177 mg/dL — ABNORMAL HIGH (ref 70–99)

## 2012-10-11 LAB — TROPONIN I
Troponin I: <0.3 ng/mL (ref <0.30)
Troponin I: <0.3 ng/mL (ref <0.30)

## 2012-10-11 LAB — HEMOGLOBIN A1C
Hgb A1c MFr Bld: 8 % — ABNORMAL HIGH (ref <5.7)
Mean Plasma Glucose: 183 mg/dL — ABNORMAL HIGH (ref <117)

## 2012-10-11 MED ORDER — GLUCERNA SHAKE PO LIQD: 237.0000 mL | Freq: Three times a day (TID) | ORAL | Status: DC

## 2012-10-11 MED ORDER — ONDANSETRON HCL 4 MG/2ML IJ SOLN: 4.0000 mg | Freq: Three times a day (TID) | INTRAMUSCULAR | Status: DC | PRN

## 2012-10-11 MED ORDER — PANTOPRAZOLE SODIUM 40 MG PO TBEC
40.0000 mg | DELAYED_RELEASE_TABLET | Freq: Two times a day (BID) | ORAL | Status: DC
  Administered 2012-10-11: 40 mg via ORAL
  Filled 2012-10-11: qty 1

## 2012-10-11 MED ORDER — SIMVASTATIN 5 MG PO TABS
5.0000 mg | ORAL_TABLET | Freq: Every day | ORAL | Status: DC
  Filled 2012-10-11: qty 1

## 2012-10-11 MED ORDER — TRAMADOL HCL 50 MG PO TABS: 50.0000 mg | ORAL_TABLET | Freq: Four times a day (QID) | ORAL | Status: DC | PRN

## 2012-10-11 MED ORDER — SODIUM CHLORIDE 0.9 % IJ SOLN
3.0000 mL | Freq: Two times a day (BID) | INTRAMUSCULAR | Status: DC
  Administered 2012-10-11: 3 mL via INTRAVENOUS

## 2012-10-11 MED ORDER — POTASSIUM CHLORIDE CRYS ER 10 MEQ PO TBCR
10.0000 meq | EXTENDED_RELEASE_TABLET | Freq: Two times a day (BID) | ORAL | Status: DC
  Administered 2012-10-11: 10 meq via ORAL
  Filled 2012-10-11 (×3): qty 1

## 2012-10-11 MED ORDER — BACLOFEN 10 MG PO TABS
10.0000 mg | ORAL_TABLET | Freq: Three times a day (TID) | ORAL | Status: DC
  Administered 2012-10-11 (×2): 10 mg via ORAL
  Filled 2012-10-11 (×5): qty 1

## 2012-10-11 MED ORDER — CLOPIDOGREL BISULFATE 75 MG PO TABS: 75.0000 mg | ORAL_TABLET | Freq: Every evening | ORAL | Status: DC

## 2012-10-11 MED ORDER — GLUCERNA SHAKE PO LIQD
237.0000 mL | Freq: Three times a day (TID) | ORAL | Status: DC
  Administered 2012-10-11: 237 mL via ORAL

## 2012-10-11 MED ORDER — INSULIN GLARGINE 100 UNIT/ML ~~LOC~~ SOLN
5.0000 [IU] | Freq: Every morning | SUBCUTANEOUS | Status: DC
  Administered 2012-10-11: 5 [IU] via SUBCUTANEOUS

## 2012-10-11 MED ORDER — HYDROCODONE-ACETAMINOPHEN 5-500 MG PO TABS: 1.0000 | ORAL_TABLET | Freq: Four times a day (QID) | ORAL | Status: DC | PRN

## 2012-10-11 MED ORDER — INSULIN ASPART 100 UNIT/ML ~~LOC~~ SOLN
0.0000 [IU] | Freq: Three times a day (TID) | SUBCUTANEOUS | Status: DC
  Administered 2012-10-11: 2 [IU] via SUBCUTANEOUS

## 2012-10-11 MED ORDER — ATENOLOL 25 MG PO TABS
25.0000 mg | ORAL_TABLET | Freq: Every day | ORAL | Status: DC
  Administered 2012-10-11: 25 mg via ORAL
  Filled 2012-10-11: qty 1

## 2012-10-11 MED ORDER — SODIUM CHLORIDE 0.9 % IV SOLN: 250.0000 mL | INTRAVENOUS | Status: DC | PRN

## 2012-10-11 MED ORDER — CARBAMAZEPINE 200 MG PO TABS
100.0000 mg | ORAL_TABLET | Freq: Two times a day (BID) | ORAL | Status: DC
  Filled 2012-10-11: qty 0.5

## 2012-10-11 MED ORDER — SODIUM CHLORIDE 0.9 % IJ SOLN: 3.0000 mL | INTRAMUSCULAR | Status: DC | PRN

## 2012-10-11 MED ORDER — PREGABALIN 50 MG PO CAPS
100.0000 mg | ORAL_CAPSULE | Freq: Three times a day (TID) | ORAL | Status: DC
  Administered 2012-10-11 (×2): 100 mg via ORAL
  Filled 2012-10-11 (×2): qty 2

## 2012-10-11 MED ORDER — POTASSIUM CHLORIDE CRYS ER 20 MEQ PO TBCR
40.0000 meq | EXTENDED_RELEASE_TABLET | Freq: Two times a day (BID) | ORAL | Status: DC
  Administered 2012-10-11: 40 meq via ORAL
  Filled 2012-10-11: qty 2

## 2012-10-11 MED ORDER — PREGABALIN 100 MG PO CAPS: 100.0000 mg | ORAL_CAPSULE | Freq: Three times a day (TID) | ORAL | Status: DC

## 2012-10-11 MED ORDER — AMLODIPINE BESYLATE 5 MG PO TABS
5.0000 mg | ORAL_TABLET | Freq: Every day | ORAL | Status: DC
  Administered 2012-10-11: 5 mg via ORAL
  Filled 2012-10-11: qty 1

## 2012-10-11 MED ORDER — MORPHINE SULFATE 2 MG/ML IJ SOLN: 2.0000 mg | INTRAMUSCULAR | Status: DC | PRN

## 2012-10-11 MED ORDER — CARBAMAZEPINE 200 MG PO TABS: 200.0000 mg | ORAL_TABLET | Freq: Two times a day (BID) | ORAL | Status: DC

## 2012-10-11 MED ORDER — HYDROCHLOROTHIAZIDE 12.5 MG PO CAPS
12.5000 mg | ORAL_CAPSULE | Freq: Every day | ORAL | Status: DC
  Administered 2012-10-11: 12.5 mg via ORAL
  Filled 2012-10-11: qty 1

## 2012-10-11 NOTE — Progress Notes (Signed)
*  PRELIMINARY RESULTS* Echocardiogram 2D Echocardiogram has been performed.  Leavy Cella 10/11/2012, 11:55 AM

## 2012-10-11 NOTE — Progress Notes (Signed)
Pt discharged to home per MD order.  Pt and family received all discharge instructions and medication information including follow-up appointments and prescriptions.  Pt alert and oriented but drowsy at discharge with no complaints.  Pt escorted to private vehicle via wheelchair.  RN attempted to schedule follow-up appointment with Dr. Clydene Fake office x3 prior to discharge with no answer.  Pt notified and states he will call after discharge. NIH documented prior to discharge. Shanda Bumps

## 2012-10-11 NOTE — Telephone Encounter (Signed)
Same noted - pt subsequently admitted to hosp - thanks

## 2012-10-11 NOTE — Progress Notes (Signed)
Wrong dod [name]

## 2012-10-11 NOTE — Progress Notes (Signed)
Not my patient

## 2012-10-11 NOTE — Consult Note (Signed)
Reason for Consult: Left-sided pain Referring Physician: Steward Ros  CC: Left-sided pain  History is obtained from: Patient, wife  HPI: [name] is a 61 y.o. male with a history of a stroke on September 22 involving the right thalamus. He states that he had pain at the time of onset, but he did not have anywhere as much pain as he has currently. He was started on Topamax which caused worsening of his symptoms, and 2 days ago was started on Tegretol which he says has not helped. In fact, today, he noticed left-sided numbness which she says was sudden in onset and which he attributes to Tegretol.  He describes the pain as electric-like sensation which is always shooting up and down his left side. He has not found anything to make it better. He describes it as 10/10 pain.    ROS: A 14 point ROS was performed and is negative except as noted in the HPI.  Past Medical History  Diagnosis Date  . ALLERGIC RHINITIS   . CAD, NATIVE VESSEL     BMS to OM1 2001, DES to BMS 2005  . DIAB W/UNSPEC COMP TYPE II/UNSPEC TYPE UNCNTRL   . ERECTILE DYSFUNCTION   . HYPERLIPIDEMIA-MIXED   . MORTON'S NEUROMA, RIGHT   . SHOULDER PAIN, RIGHT   . HYPERTENSION, BENIGN   . Peripheral neuropathy   . Chronic back pain     herniated disc  . GERD   . Constipation     takes Carafate four times day  . Seasonal allergies     takes Allegra and Benadryl daily prn;uses Flonase daily  . TIA on medication 02/2012  . Stroke, thrombotic 07/2012    L HP + hemiparesis, s/p CIR     Family History: No history of stroke  Social History: Tob: Denies  Exam: Current vital signs: BP 129/80  Pulse 76  Temp 98.1 F (36.7 C) (Oral)  Resp 16  SpO2 100% Vital signs in last 24 hours: Temp:  [98.1 F (36.7 C)-98.8 F (37.1 C)] 98.1 F (36.7 C) (11/19 2110) Pulse Rate:  [76-84] 76  (11/19 2230) Resp:  [14-18] 16  (11/19 2230) BP: (120-146)/(75-84) 129/80 mmHg (11/19 2230) SpO2:  [100 %] 100 % (11/19  2230)  General: In bed, appears mildly uncomfortable CV: Regular rate and rhythm Mental Status: Patient is awake, alert, oriented to person, place, month, year, and situation. Immediate and remote memory are intact. Patient is able to give a clear and coherent history. Cranial Nerves: II: Visual Fields are full. Pupils are equal, round, and reactive to light.  Discs are difficult to visualize. III,IV, VI: EOMI without ptosis or diploplia. Initially seems to have trouble on left gaze, but with smooth pursuit is able to fully look left.  V: Facial sensation is decreased to temperature on the left VII: Facial movement is notable for a mild left facial weakness.  VIII: hearing is intact to voice X: Uvula elevates symmetrically XI: Shoulder shrug is symmetric. XII: tongue is midline without atrophy or fasciculations.  Motor: Tone is mildly increased on left. Bulk is normal. 5/5 strength was present in his right side, his left was 4/5 throughout.   Sensory: Sensation is decreased on the left to light touch and temperature in the arms and legs. Deep Tendon Reflexes: 2+ and symmetric in the right bicep and patella. 3+ on left Plantars: Toes are downgoing bilaterally.  Cerebellar: FNF intact on right, unable to perform on left.  Gait: Did not assess 2/2 weakness.  I have reviewed labs in epic and the results pertinent to this consultation are: BMP mild hypokalemia CBC mild anemia  I have reviewed the images obtained:CT head - chronic right thalamic/IC infarct  Impression: 61 yo M with left sided pain in the setting of previous thalamic infarct. This is most consistent with thalamic pain syndrome. His sudden worsening of the numbness on the left is concerning, however, and therefore I would recommend a repeat MRI to rule out extension of his infarct. I suspect that tegretol is not responsible for the current symptoms, but it would be prudent to stop it anyway given the temporal  correlation.   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    Roland Rack, MD [facility] [phone]  If 7pm- 7am, please page neurology on call at [phone].

## 2012-10-11 NOTE — Discharge Summary (Signed)
Physician Discharge Summary  [name] [id number] DOB: 04/08/1951 DOA: 10/10/2012  PCP: [name], MD  Admit date: 10/10/2012 Discharge date: 10/11/2012  Time spent: 38 minutes  Recommendations for Outpatient Follow-up:  1. Home health Physical Therapy, Occupational Therapy, RN, Aide  Discharge Diagnoses:  Principal Problem:  *Chest pain Active Problems:  CVA (cerebral infarction) old  HYPERTENSION, BENIGN  CAD, NATIVE VESSEL  Chronic back pain  Peripheral neuropathy  Left sided numbness  Hemiparesis affecting left side as late effect of stroke  Cerebral embolism with cerebral infarction   Discharge Condition: Stable, Still with numbness and neuropathic pain on the left side.  Diet recommendation: carb modified.  Filed Weights   10/11/12 0120  Weight: 81.8 kg (180 lb 5.4 oz)    History of present illness:  61 yo male cva rith left sided hemiparesis from cva in 9/13 since that time has been having issues with left sided muscle spasms and "nerve pain" being managed by neruology as outpt. Recently started on topirimate and pt says this medicine has made his pain much worse and is more weak on left side. He also noted some cp that he says are spasms and we were called by edp to admit the patient because of his cp. There is no new slurred speech or new focal neuro def except the worse wekaness in his left upper and lower ext. No fevers.   Hospital Course:   New CVA MRI shows new infarct versus extension of acute infarct in the right lateral thalamus and posterior limb internal capsule. Neurology consulted (Dr. Alexis Goodell) On Aspirin and Plavix Will resume Home Health PT/OT Follow up with Dr. Leonie Man  Chest Pain Cardiac enzymes x 2 are negative. Serial EKGs are normal Echo shows Grade 1 diastolic dysfunction.  LVEF 55% His chest pain is most likely not cardiac in nature.  Acute Left Sided Pain He describes the pain as electric-like sensation  which is always shooting up and down his left side His pain began after starting carbamazepinel.  This has been discontinued by Neuro Lyrica started 100 mg TID Remain on Baclofen Vicodin prescribed for severe pain.  HTN Remain on amlodipine, atenolol, and HCTZ Well controlled.  Diabetes Controlled with insulin Resume prior to admission insulin doses   Consultations:  Neurology  Discharge Exam: Filed Vitals:   10/11/12 0500 10/11/12 0700 10/11/12 0751 10/11/12 1104  BP: 111/68 117/66 125/68 110/66  Pulse: 76 77 78 75  Temp:  98.5 F (36.9 C) 98.4 F (36.9 C)   TempSrc:   Oral   Resp:  16 16   Height:      Weight:      SpO2:  98% 100%     General: Alert and oriented, no apparent distress, lying in bed, wife at bedside Cardiovascular: Regular rate and rhythm, no murmurs rubs or gallops Abdomen:  Soft, nontender, nondistended, positive bowel sounds, no masses Respiratory:  clear to auscultation, no wheezes crackles or rales Neuro:  Patient has weakness and numbness in both his left arm and leg Psychiatric: Patient alert and oriented, cooperative, well groomed  Discharge Instructions  Discharge Orders    Future Appointments: Provider: Department: Dept Phone: Center:   10/13/2012 10:00 AM Sable Feil, MD [facility] [phone] [facility]   10/16/2012 10:30 AM [facility] [place] [facility] [phone] None   10/16/2012 1:15 PM Charlett Blake[facility] [phone] None   10/17/2012 10:30 AM Cyril Mourning Jenene Slicker, PT Outpt  [place]   10/23/2012 9:45 AM [facility] [place] [phone] [facility]   01/19/2013 10:00 AM Rowe Clack, MD [facility] [phone] [facility]   07/09/2013 8:15 AM Hayden Pedro, MD [facility] [phone] None     Future Orders Please Complete By Expires   Diet - low sodium heart healthy      Increase activity slowly          Medication List     As of 10/11/2012 12:47 PM    STOP taking these medications         carbamazepine 200 MG tablet   Commonly known as: TEGRETOL      TAKE these medications         ALLEGRA 180 MG tablet   Generic drug: fexofenadine   Take 180 mg by mouth daily as needed. For allergies      amLODipine 5 MG tablet   Commonly known as: NORVASC   Take 5 mg by mouth daily.      atenolol 25 MG tablet   Commonly known as: TENORMIN   Take 25 mg by mouth every morning.      baclofen 10 MG tablet   Commonly known as: LIORESAL   Take 1 tablet (10 mg total) by mouth 4 (four) times daily.      BIOFREEZE EX   Apply 1 application topically 3 (three) times daily as needed. For pain      clopidogrel 75 MG tablet   Commonly known as: PLAVIX   Take 75 mg by mouth every evening.      diclofenac sodium 1 % Gel   Commonly known as: VOLTAREN   Apply 2 g topically 4 (four) times daily as needed. For pain: left knee and left ankle      feeding supplement Liqd   Take 237 mLs by mouth 3 (three) times daily between meals.      fluticasone 50 MCG/ACT nasal spray   Commonly known as: FLONASE   Place 2 sprays into the nose daily as needed. For dry nasal passages      hydrochlorothiazide 12.5 MG capsule   Commonly known as: MICROZIDE   Take 12.5 mg by mouth every morning.      HYDROcodone-acetaminophen 5-500 MG per tablet   Commonly known as: VICODIN   Take 1 tablet by mouth every 6 (six) hours as needed for pain.      insulin aspart 100 UNIT/ML injection   Commonly known as: novoLOG   Inject 5 Units into the skin 3 (three) times daily before meals.      insulin glargine 100 UNIT/ML injection   Commonly known as: LANTUS   Inject 5 Units into the skin every morning.      nitroGLYCERIN 0.4 MG SL tablet   Commonly known as: NITROSTAT    Place 0.4 mg under the tongue every 5 (five) minutes as needed. For chest pain      pantoprazole 40 MG tablet   Commonly known as: PROTONIX   Take 1 tablet (40 mg total) by mouth 2 (two) times daily.      polyethylene glycol packet   Commonly known as: MIRALAX / GLYCOLAX   Take 17 g by mouth daily. For constipation      potassium chloride 10 MEQ tablet   Commonly known as: K-DUR,KLOR-CON   Take 1 tablet (10 mEq total) by mouth 2 (two) times daily.  pravastatin 40 MG tablet   Commonly known as: PRAVACHOL   Take 40 mg by mouth at bedtime.      pregabalin 100 MG capsule   Commonly known as: LYRICA   Take 1 capsule (100 mg total) by mouth 3 (three) times daily.      senna-docusate 8.6-50 MG per tablet   Commonly known as: Senokot-S   Take 2 tablets by mouth 2 (two) times daily. For constipation.      traMADol 50 MG tablet   Commonly known as: ULTRAM   Take 1 tablet (50 mg total) by mouth every 6 (six) hours as needed for pain. For pain.  DO NOT TAKE IN COMBINATION WITH VICODIN          The results of significant diagnostics from this hospitalization (including imaging, microbiology, ancillary and laboratory) are listed below for reference.    Significant Diagnostic Studies: Ct Head Wo Contrast  10/10/2012  *RADIOLOGY REPORT*  Clinical Data: Left side numbness, history of stroke with left side paralysis  CT HEAD WITHOUT CONTRAST  Technique:  Contiguous axial images were obtained from the base of the skull through the vertex without contrast.  Comparison: 09/20/2012  Findings: Normal ventricular morphology. No midline shift or mass effect. Small vessel chronic ischemic changes of deep cerebral white matter. Old infarct identified at posterior right internal capsule and right thalamus. No intracranial hemorrhage, mass lesion, or evidence of acute infarction. No extra-axial fluid collections. Bones and sinuses unremarkable.  IMPRESSION: Small vessel chronic ischemic changes of  deep cerebral white matter. Old infarct posterior limb right internal capsule and right thalamus. No definite acute intracranial abnormalities.   Original Report Authenticated By: Lavonia Dana, M.D.    Ct Head Wo Contrast  09/20/2012  *RADIOLOGY REPORT*  Clinical Data: Left-sided numbness extending around lips and throat.  CT HEAD WITHOUT CONTRAST  Technique:  Contiguous axial images were obtained from the base of the skull through the vertex without contrast.  Comparison: MRI of the brain 08/15/2012.  Findings: The subacute non hemorrhagic infarct of the posterior limb right internal capsule and thalamus is again noted.  Remote ischemic changes are noted adjacent to the frontal horn of the left lateral ventricle.  Additional subcortical white matter changes are stable bilaterally.  No acute cortical infarct, hemorrhage, mass lesion is present.  The ventricles are of normal size.  No significant extra-axial fluid collection is present.  A remote lacunar infarct of the right caudate head is again noted.  The paranasal sinuses and mastoid air cells are clear.  The osseous skull is intact.  IMPRESSION:  1.  Stable appearance of the subacute non hemorrhagic infarct in the posterior limb right internal capsule and thalamus. 2.  Remote lacunar infarct of the right caudate head. 3.  Extensive subcortical white matter changes are stable. 4.  No acute intracranial abnormality.   Original Report Authenticated By: Resa Miner. MATTERN, M.D.    Mr Brain Wo Contrast  10/11/2012  *RADIOLOGY REPORT*  Clinical Data: Increasing left-sided weakness.  Altered mental status  MRI HEAD WITHOUT CONTRAST  Technique:  Multiplanar, multiecho pulse sequences of the brain and surrounding structures were obtained according to standard protocol without intravenous contrast.  Comparison: CT 10/10/2012  Findings: Restricted diffusion in the right lateral thalamus and posterior limb internal capsule compatible with infarction.  MRI head  08/15/2012 revealed restricted diffusion throughout this area. It is possible there has been some slight extension of the infarct on the lateral margin, as diffusion restriction from the  acute infarct on 08/15/2012 should have resolved by now.  No other areas of restricted diffusion are evident.  Chronic microvascular ischemic changes are present throughout the white matter and pons.  Micro hemorrhage left globus pallidus, right occipital lobe.  These may be due to chronic hypertension. No mass lesion is present.  Chronic sinusitis  IMPRESSION: Restricted diffusion in the right lateral thalamus and posterior limb internal capsule.  This area showed restricted diffusion on 08/15/2012.  This could be an area of extension of acute infarct since that time.  No other areas of acute infarct.  Moderate chronic microvascular ischemic changes.  Chronic sinusitis.   Original Report Authenticated By: Carl Best, M.D.    Dg Chest Portable 1 View  10/10/2012  *RADIOLOGY REPORT*  Clinical Data: Numbness  PORTABLE CHEST - 1 VIEW  Comparison: 08/14/2012  Findings: Heart size upper normal to mildly enlarged.  Mediastinal contours otherwise within normal range.  Lungs are predominately clear.  No pleural effusion or pneumothorax.  No acute osseous finding.  IMPRESSION: Heart size upper normal to mildly enlarged.  No acute process identified.   Original Report Authenticated By: Carlos Levering, M.D.     Labs: Basic Metabolic Panel:  Lab [id number] 2147  NA 138  K 3.3*  CL 103  CO2 26  GLUCOSE 80  BUN 19  CREATININE 1.23  CALCIUM 9.4  MG --  PHOS --   Liver Function Tests:  Lab 10/10/12 2147  AST 25  ALT 30  ALKPHOS 57  BILITOT 0.6  PROT 7.3  ALBUMIN 3.7   CBC:  Lab 10/10/12 2147  WBC 6.1  NEUTROABS --  HGB 12.4*  HCT 35.2*  MCV 85.4  PLT 200   Cardiac Enzymes:  Lab 10/11/12 1045 10/11/12 0215  CKTOTAL -- --  CKMB -- --  CKMBINDEX -- --  TROPONINI <0.30 <0.30   CBG:  Lab 10/11/12 1149  10/11/12 0734 10/11/12 0136 10/10/12 2040  GLUCAP 177* 109* 103* 68*    Signed:  Karen Kitchens [phone] [facility] 10/11/2012, 12:47 PM

## 2012-10-11 NOTE — Care Management Note (Signed)
    Page 1 of 1   10/11/2012     1:31:25 PM   CARE MANAGEMENT NOTE 10/11/2012  Patient:  ANDREJS, WIEGERS   Account Number:  [id number]  Date Initiated:  10/11/2012  Documentation initiated by:  GRAVES-BIGELOW,Arrow Emmerich  Subjective/Objective Assessment:   Pt admitted with cp. plan for home today with hh services.     Action/Plan:   CM will make referral for [facility], PT/OT. SOC to begin within 24-48 hours post d/c.   Anticipated DC Date:  10/11/2012   Anticipated DC Plan:  [place]  CM consult      Choice offered to / List presented to:  C-1 Patient        [place] arranged  HH-1 RN  [place] OT      [place].   Status of service:  Completed, signed off Medicare Important Message given?   (If response is "NO", the following Medicare IM given date fields will be blank) Date Medicare IM given:   Date Additional Medicare IM given:    Discharge Disposition:  [place]  Per UR Regulation:  Reviewed for med. necessity/level of care/duration of stay  If discussed at [place] of Stay Meetings, dates discussed:    Comments:

## 2012-10-11 NOTE — Discharge Summary (Signed)
Patient seen and examined. Agree with note by Imogene Burn, PA. Patient was admitted earlier today with increased left-sided pain that has been an issue for him since his prior CVA in September. MRI Brain shows a new area of ischemia at the periphery of his old infarct. Discussed with Dr. Doy Mince. No new medications. Continue lyrica for neuropathic pain. Will be discharged home today to follow up with Dr. Leonie Man outpatient.  Mike Mend, MD [facility] Pager: [phone]

## 2012-10-11 NOTE — Progress Notes (Signed)
MRI from today and September reviewed.  It does seem that the patient has a new area of ischemia at the periphery of his old infarct.  This is likely secondary to small vessel disease.  Patient on Plavix.  Reports compliance.  Case reviewed with Dr. Leonie Man who sees the patient on an outpatient basis as well.   Plan: 1.  Patient to continue on Plavix at 75mg  daily 2.  Continue Lyrica at 100mg  TID 3.  Patient to follow up with Dr. Leonie Man as an outpatient.  4.  To continue therapy as an outpatient  Alexis Goodell, MD [facility] [phone]  10/11/2012,  11:54 AM

## 2012-10-11 NOTE — Telephone Encounter (Signed)
[facility] Triage Call Report Triage Record Num: [id number] Operator: Finis Bud Patient Name: [name] Call Date & Time: 10/10/2012 6:09:53PM Patient Phone: [phone] PCP: [name] Patient Gender: Male PCP Fax : [phone] Patient DOB: 1951-10-23 Practice Name: [facility] Reason for Call: Caller: [name]/Patient; PCP: [name] (Adults only); CB#: [phone]; Call regarding Medication Reaction from medication given to pt today, 10/10/2012. Pt reports that has taken 3 dosages of Epitol and "locking up left side". Pt reports that he is having nausea. Onset of nausea 10/10/2012. Pt reports tightness ans aching in muscles. Pt instructed to be seen in 4 hours per new onset of muscle aches without know causes and less than 10 days after starting new medications. Protocol(s) Used: Allergic Reaction, Severe Recommended Outcome per Protocol: Call Provider within 4 Hours Reason for Outcome: New onset rash, joint pain, muscle aches, swollen glands or any temperature elevation without known cause AND less than 10 days after starting new medication(s) Care Advice: ~ SYMPTOM / CONDITION MANAGEMENT ~ CAUTIONS ~ List, or take, all current prescription(s), nonprescription or alternative medication(s) to provider for evaluation. Call EMS 911 if develop signs and symptoms of anaphylaxis within minutes to several hours of exposure: severe difficulty breathing; rapid, weak or irregular pulse; pruritus, urticaria, swelling of face, lips, tongue, or throat causing tightness or difficulty swallowing; abdominal cramping, nausea, vomiting or diarrhea. ~ 10/10/2012 6:29:05PM Page 1 of 1 [email or web address]

## 2012-10-12 ENCOUNTER — Telehealth: Payer: Self-pay | Admitting: General Practice

## 2012-10-12 NOTE — Telephone Encounter (Signed)
Called patient today to follow up after discharge from hospital.  Patient states that he is doing "ok" but that he is unable to get the Lyrica because co-pay is $108.00.  I told him that I would send a note to Dr. Asa Lente to find out if there is a less expensive drug.  Patient will be evaluated by [facility] for physical and occupational therapy this week and will also have a RN come out periodically over the next few weeks. Pt and wife have no questions regarding home health at this time.   Patient states that he nor his wife have any questions about hospital discharge instructions other than getting the Lyrica.  Patient states that he cannot move left leg very well but that he can move other extremities and is able to get around with some help. Patient's spouse is the main care-giver.  Patient states that he does have transportation to Dr. Thomasene Lot and that all follow up appointments have been made.  Patient and wife are able to manage ADL's.  I instructed patient and wife to call Dr. Katheren Puller office if any questions.  Follow up appointment made for Monday 11-25 @ 10 am.

## 2012-10-13 ENCOUNTER — Encounter: Payer: Self-pay | Admitting: Gastroenterology

## 2012-10-13 ENCOUNTER — Ambulatory Visit (INDEPENDENT_AMBULATORY_CARE_PROVIDER_SITE_OTHER): Payer: Medicare Other | Admitting: Gastroenterology

## 2012-10-13 VITALS — BP 110/60 | HR 82

## 2012-10-13 DIAGNOSIS — I69391 Dysphagia following cerebral infarction: Secondary | ICD-10-CM

## 2012-10-13 DIAGNOSIS — Z8673 Personal history of transient ischemic attack (TIA), and cerebral infarction without residual deficits: Secondary | ICD-10-CM

## 2012-10-13 DIAGNOSIS — I69991 Dysphagia following unspecified cerebrovascular disease: Secondary | ICD-10-CM

## 2012-10-13 DIAGNOSIS — K219 Gastro-esophageal reflux disease without esophagitis: Secondary | ICD-10-CM

## 2012-10-13 NOTE — ED Provider Notes (Signed)
Medical screening examination/treatment/procedure(s) were performed by resident physician or non-physician practitioner and as supervising physician I was immediately available for consultation/collaboration.   Pauline Good MD.    Billy Fischer, MD 10/13/12 1329

## 2012-10-13 NOTE — Progress Notes (Signed)
History of Present Illness:  This is a extremely complicated 61 year old African American male who has recently had 2 CVAs requiring recurrent hospitalizations.  He currently is being treated for accelerated hypertension, diabetes,CAD, and is on Plavix anticoagulation, insulin, and multiple antihypertensives. .  He has a very involved medical history, and apparently had a laryngoplasty performed by Dr. 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  The patient. During his recent hospitalization apparently had rather severe dysphagia, and allegedly was evaluated with endoscopic exam and speech pathology consultation.  His wife gives most of the history during his exam.  I cannot find reports of speech pathology or endoscopy.  In any case, the patient has chronic GERD and currently is on Protonix 40 mg twice a day, and denies acid reflux symptoms or dysphagia for solid foods.  He does complain of some swallowing problems with pills and he takes his pills with applesauce successfully.  He is having no current aspiration problems, and his neurologic difficulties have improved dramatically.  Currently is [facility].  Primary care doctor is [name].  Patient and wife do not want any further invasive procedures performed.  I did reveal colonoscopy on him in 2007.  Review of the patient's records also shows evidence of coronary artery disease with previous cardiac stents.  and he is followed by cardiology.  Multiple time spent of excessive records and radiographs reviewed.  Of his visit with patient and his wife and his presents today.   I have reviewed this patient's present history, medical and surgical past history, allergies and medications.   Allergies  Allergen Reactions  . Penicillins Anaphylaxis  . Pneumococcal Vaccines Anaphylaxis  . Shellfish Allergy Anaphylaxis  . Gabapentin     Urinary difficulties  .  Lisinopril     cough  . Topiramate Other (See Comments)    Chest spasms and numbness   Outpatient Prescriptions Prior to Visit  Medication Sig Dispense Refill  . amLODipine (NORVASC) 5 MG tablet Take 5 mg by mouth daily.   30 tablet  11  . atenolol (TENORMIN) 25 MG tablet Take 25 mg by mouth every morning.       . baclofen (LIORESAL) 10 MG tablet Take 1 tablet (10 mg total) by mouth 4 (four) times daily.  40 tablet  2  . clopidogrel (PLAVIX) 75 MG tablet Take 75 mg by mouth every evening.       . diclofenac sodium (VOLTAREN) 1 % GEL Apply 2 g topically 4 (four) times daily as needed. For pain: left knee and left ankle      . feeding supplement (GLUCERNA SHAKE) LIQD Take 237 mLs by mouth 3 (three) times daily between meals.  30 Can  0  . fexofenadine (ALLEGRA) 180 MG tablet Take 180 mg by mouth daily as needed. For allergies      . hydrochlorothiazide (MICROZIDE) 12.5 MG capsule Take 12.5 mg by mouth every morning.      Marland Kitchen HYDROcodone-acetaminophen (VICODIN) 5-500 MG per tablet Take 1 tablet by mouth every 6 (six) hours as needed for pain.  30 tablet  0  . insulin aspart (NOVOLOG) 100 UNIT/ML injection Inject 5 Units into the skin 3 (three) times daily before meals.      . insulin glargine (LANTUS) 100 UNIT/ML injection Inject 5 Units into the skin every morning.      . Menthol, Topical Analgesic, (BIOFREEZE EX) Apply 1 application topically 3 (three) times daily as needed.  For pain      . nitroGLYCERIN (NITROSTAT) 0.4 MG SL tablet Place 0.4 mg under the tongue every 5 (five) minutes as needed. For chest pain      . pantoprazole (PROTONIX) 40 MG tablet Take 1 tablet (40 mg total) by mouth 2 (two) times daily.  60 tablet  3  . polyethylene glycol (MIRALAX / GLYCOLAX) packet Take 17 g by mouth daily. For constipation  14 each    . potassium chloride (K-DUR,KLOR-CON) 10 MEQ tablet Take 1 tablet (10 mEq total) by mouth 2 (two) times daily.  60 tablet  1  . pravastatin (PRAVACHOL) 40 MG tablet Take  40 mg by mouth at bedtime.       . pregabalin (LYRICA) 100 MG capsule Take 1 capsule (100 mg total) by mouth 3 (three) times daily.  90 capsule  0  . senna-docusate (SENOKOT-S) 8.6-50 MG per tablet Take 2 tablets by mouth 2 (two) times daily. For constipation.      . traMADol (ULTRAM) 50 MG tablet Take 1 tablet (50 mg total) by mouth every 6 (six) hours as needed for pain. For pain.  DO NOT TAKE IN COMBINATION WITH VICODIN  100 tablet  1  . [DISCONTINUED] fluticasone (FLONASE) 50 MCG/ACT nasal spray Place 2 sprays into the nose daily as needed. For dry nasal passages       Last reviewed on 10/13/2012 11:07 AM by Sable Feil, MD Past Medical History  Diagnosis Date  . ALLERGIC RHINITIS   . CAD, NATIVE VESSEL     BMS to OM1 2001, DES to BMS 2005  . DIAB W/UNSPEC COMP TYPE II/UNSPEC TYPE UNCNTRL   . ERECTILE DYSFUNCTION   . HYPERLIPIDEMIA-MIXED   . MORTON'S NEUROMA, RIGHT   . SHOULDER PAIN, RIGHT   . HYPERTENSION, BENIGN   . Peripheral neuropathy   . Chronic back pain     herniated disc  . GERD   . Constipation     takes Carafate four times day  . Seasonal allergies     takes Allegra and Benadryl daily prn;uses Flonase daily  . TIA on medication 02/2012  . 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 Stent 2001, 2004    coronary stents  . Angioplasty   . Left knee surgury     x 2   . Dental surgery   . Coronary angioplasty 2005    2 stents  . Colonoscopy   . Laryngoplasty 08/07/2012    Procedure: LARYNGOPLASTY;  Surgeon: Izora Gala, MD;  Location: [facility];  Service: ENT;  Laterality: Left;  Left Vocal Cord Medialyzation   History   Social History  . Marital Status: Legally Separated    Spouse Name: N/A    Number of Children: N/A  . Years of Education: N/A   Occupational History  . disabled    Social History Main Topics  . Smoking status: Former Smoker    Quit date: 02/24/1983  . Smokeless tobacco: Never  Used     Comment: Married, lives in [place] with wife. He is disabled secondary to back pain. 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 Alcohol Use: No  . Drug Use: No  . Sexually Active: None   Other Topics Concern  . None   Social History Narrative  . None   Family History  Problem Relation Age of Onset  . Stroke  Physical Exam: Blood pressure today 110/60, pulse 82, and he is 99% oxygen saturation.  The patient did not wall physical exam.  Examination oral pharyngeal area was unremarkable as was examination of his neck.  I could not appreciate any lymphadenopathy or neck masses.  His mental status was normal.  Assessment and plans: This patient does have chronic GERD; but is doing well on twice a day PPI therapy.  He has probable multiple etiologies for his mild dysphagia, but I suspect most of his current problems are related to his recent CVAs.  He is not a candidate for conscious sedation or endoscopic exams at this time.  I have asked him to continue all medications as listed and reviewed.  If he is recurrent problems we will reassess his swallowing with speech pathology and a formal barium swallow.   Please copy her primary care physician, referring physician, and pertinent subspecialists.   No diagnosis found.

## 2012-10-13 NOTE — Patient Instructions (Signed)
Please follow up as needed  Please continue your Protonix twice daily

## 2012-10-16 ENCOUNTER — Ambulatory Visit (INDEPENDENT_AMBULATORY_CARE_PROVIDER_SITE_OTHER): Payer: Medicare Other | Admitting: Internal Medicine

## 2012-10-16 ENCOUNTER — Inpatient Hospital Stay: Payer: Medicare Other | Admitting: Physical Medicine & Rehabilitation

## 2012-10-16 ENCOUNTER — Encounter: Payer: Self-pay | Admitting: Internal Medicine

## 2012-10-16 VITALS — BP 128/72 | HR 75 | Temp 97.3°F | Ht 71.0 in | Wt 198.0 lb

## 2012-10-16 DIAGNOSIS — I69354 Hemiplegia and hemiparesis following cerebral infarction affecting left non-dominant side: Secondary | ICD-10-CM

## 2012-10-16 DIAGNOSIS — IMO0001 Reserved for inherently not codable concepts without codable children: Secondary | ICD-10-CM

## 2012-10-16 DIAGNOSIS — I10 Essential (primary) hypertension: Secondary | ICD-10-CM

## 2012-10-16 DIAGNOSIS — I639 Cerebral infarction, unspecified: Secondary | ICD-10-CM

## 2012-10-16 DIAGNOSIS — G629 Polyneuropathy, unspecified: Secondary | ICD-10-CM

## 2012-10-16 MED ORDER — HYDROCODONE-ACETAMINOPHEN 5-325 MG PO TABS: 1.0000 | ORAL_TABLET | Freq: Four times a day (QID) | ORAL | Status: DC | PRN

## 2012-10-16 MED ORDER — GABAPENTIN 300 MG PO CAPS: 600.0000 mg | ORAL_CAPSULE | Freq: Three times a day (TID) | ORAL | Status: DC

## 2012-10-16 MED ORDER — INSULIN PEN NEEDLE 31G X 8 MM MISC: Status: DC

## 2012-10-16 NOTE — Assessment & Plan Note (Signed)
Uncontrolled Changed to Lantus 03/2012 hosp for TIA - off metformin/januvia Continue Novolog TID AC Also encouraged follow up with endo - ellison Lab Results  Component Value Date   HGBA1C 8.0* 10/11/2012

## 2012-10-16 NOTE — Assessment & Plan Note (Signed)
changed coreg to low dose atenolol 03/2012 Added amlodipine early 09/2012 - improved follow up cards as planned for additional titration as needed BP Readings from Last 3 Encounters:  10/16/12 128/72  10/13/12 110/60  10/11/12 116/67

## 2012-10-16 NOTE — Assessment & Plan Note (Signed)
CVA 07/2012 -associated with left hemiparesis and numbness/paresthesia (neuropathic pain - see next) Following with neurology for same, continue medical management including risk factor control - Hx TIA 03/2012 with similar symptoms, changed ASA to Plavix at that time, but hospitalization eval 03/2012 unremarkable for CVA -  Plavix was on hold x 1 week for ENT surgery 07/2012 when recurrent symptoms resulted in CVA Then increasing symptoms prompted repeat RI 09/2012 - ?extension of 07/2012 CVA vs new CVA - Continue ASA + Plavix and control of risk factors - importance of compliance reviewed Continue HH/OP therapy and follow up with neuro as planned

## 2012-10-16 NOTE — Assessment & Plan Note (Signed)
Neuropathic pain in left side since 07/2012 CVA  Improving pain on Lyrica since 09/2012 but unable to afford same No relief with Topamax or Depakote trial by neuro prior to recurrent 09/2012 CVA symptoms We'll try again on gabapentin -prior intolerance reviewed and disproved today Also okay to continue hydrocodone, tramadol and baclofen for spasms control pain as needed - refill today

## 2012-10-16 NOTE — Patient Instructions (Signed)
It was good to see you today. We have reviewed your prior records including labs and tests today Medications reviewed and updated - change Lyrica to gabapentin - take as directed for pain - ok to use baclofen, tramadol and/or Lortab for pain in addition to gabapentin as needed. Your prescription(s) have been submitted to your pharmacy. Please take as directed and contact our office if you believe you are having problem(s) with the medication(s). Keep followup with other specialists and therapy as scheduled Please schedule followup in 6 weeks for blood pressure recheck, call sooner if problems.

## 2012-10-16 NOTE — Progress Notes (Signed)
Subjective:    Patient ID: [name], male    DOB: 1951-06-03, 61 y.o.   MRN: [id number]  HPI  Here for hsopital follow up  - reviewed interval history  R brain CVA 07/2012, recurrent event vs extension of acute infarct 09/2012- weakness, pain and numbness involving his left side. Hx TIA with similar presentation in May 2013.  on aspirin + Plavix - Patient was not a TPA candidate secondary to major ENT surgery 07/2012. S/p CIR inpt rehab 07/2012 and ongoing [facility]. Currently pain managed with lyrica but $$ prohibitive for refills - ?alternative  Dysphagia - felt to be multifactorial: CVA, ENT surgery and ?GI  Affects solids > liquids with regurgitation after meals -  s/p GI eval 10/13/12 - no other aggressive eval planned at this time associated with hoarseness and sensation of mucus/gagging/cough -  Also working with ENT for same, s/p laryngeal surgery 07/2012 (1 week prior to 1st CVA)  left leg and hip pain with spasms since 07/2012 CVA - ?neuropathic from CVA History of similar symptoms with remote sciatica and ruptured lumbar disc improved with 6 day pred pak and baclofen TID in 07/2012, now on baclofen, lyrica, tramadol and hydrocodone s/p ortho spine eval, ortho eval for same - also neuro OP and IP remains difficult to assess if strength affected because of recent right hemisphere CVA causing left hemiparesis and hemisensory deficits  Past Medical History  Diagnosis Date  . ALLERGIC RHINITIS   . CAD, NATIVE VESSEL     BMS to OM1 2001, DES to BMS 2005  . DIAB W/UNSPEC COMP TYPE II/UNSPEC TYPE UNCNTRL   . ERECTILE DYSFUNCTION   . HYPERLIPIDEMIA-MIXED   . MORTON'S NEUROMA, RIGHT   . SHOULDER PAIN, RIGHT   . HYPERTENSION, BENIGN   . Peripheral neuropathy   . Chronic back pain     herniated disc  . GERD   . Constipation     takes Carafate four times day  . Seasonal allergies     takes Allegra and Benadryl daily prn;uses Flonase daily  . TIA on medication 02/2012  . Stroke,  thrombotic 07/2012    L HP + hemiparesis, s/p CIR    Family History  Problem Relation Age of Onset  . Stroke     History  Substance Use Topics  . Smoking status: Former Smoker    Quit date: 02/24/1983  . Smokeless tobacco: Never Used     Comment: Married, lives in [place] with wife. He is disabled secondary to back pain. 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 Alcohol Use: No    Review of Systems  Constitutional: Positive for fatigue. Negative for fever and unexpected weight change.  Respiratory: Negative for cough, shortness of breath and wheezing.   Cardiovascular: Negative for chest pain and palpitations.  Gastrointestinal: Negative for abdominal pain, constipation and abdominal distention.  Musculoskeletal: Positive for gait problem. Negative for myalgias, back pain and joint swelling.  Neurological: Positive for tremors (LLE, worse at night and after exertion), weakness and numbness. Negative for dizziness, seizures, speech difficulty and headaches.  Psychiatric/Behavioral: Negative for behavioral problems and confusion.  No other specific complaints in a complete review of systems (except as listed in HPI above).      Objective:   Physical Exam  BP 128/72  Pulse 75  Temp 97.3 F (36.3 C) (Oral)  Ht 5\' 11"  (1.803 m)  Wt 198 lb (89.812 kg)  BMI 27.62 kg/m2  SpO2 99% Wt Readings from Last 3 Encounters:  10/16/12 198 lb (89.812 kg)  10/11/12 180 lb 5.4 oz (81.8 kg)  09/25/12 190 lb 1.9 oz (86.238 kg)   Constitutional:  He appears well-developed and well-nourished. No distress. Wife at side Neck: Normal range of motion. Neck supple. No JVD or LAD present. No thyromegaly present.  Cardiovascular: Normal rate, regular rhythm and normal heart sounds.  No murmur heard. no BLE edema Pulmonary/Chest: Effort normal and breath sounds normal. No respiratory distress. no wheezes.  Abdominal: Soft. Bowel sounds are normal. Patient exhibits no distension. There is no tenderness.    Neurologic: min L facial paralysis, no significant dysarthria, no aphasia - LUE with mild hemiparesis and poor coordination/poor fine motor control MSkel: L hip with full ROM, nontender to groin palpation. Back: full range of motion of thoracic and lumbar spine. Non tender to palpation. Positive ipsilateral straight leg raise. DTR's are intact but hyperactive on left - no residual involuntary spasm of L hip flexors at this time. patient ambulates with antalgic gait.  Skin - no bruise or rash Psychiatric: he has a normal mood and affect. behavior is normal. Judgment and thought content normal.   Lab Results  Component Value Date   WBC 6.1 10/10/2012   HGB 12.4* 10/10/2012   HCT 35.2* 10/10/2012   PLT 200 10/10/2012   GLUCOSE 80 10/10/2012   CHOL 90 08/15/2012   TRIG 59 08/15/2012   HDL 52 08/15/2012   LDLDIRECT 112.2 02/25/2010   LDLCALC 26 08/15/2012   ALT 30 10/10/2012   AST 25 10/10/2012   NA 138 10/10/2012   K 3.3* 10/10/2012   CL 103 10/10/2012   CREATININE 1.23 10/10/2012   BUN 19 10/10/2012   CO2 26 10/10/2012   PSA 0.16 04/13/2010   INR 1.06 08/14/2012   HGBA1C 8.0* 10/11/2012   Ct Head Wo Contrast  10/10/2012  *RADIOLOGY REPORT*  Clinical Data: Left side numbness, history of stroke with left side paralysis  CT HEAD WITHOUT CONTRAST  Technique:  Contiguous axial images were obtained from the base of the skull through the vertex without contrast.  Comparison: 09/20/2012  Findings: Normal ventricular morphology. No midline shift or mass effect. Small vessel chronic ischemic changes of deep cerebral white matter. Old infarct identified at posterior right internal capsule and right thalamus. No intracranial hemorrhage, mass lesion, or evidence of acute infarction. No extra-axial fluid collections. Bones and sinuses unremarkable.  IMPRESSION: Small vessel chronic ischemic changes of deep cerebral white matter. Old infarct posterior limb right internal capsule and right thalamus. No  definite acute intracranial abnormalities.   Original Report Authenticated By: Lavonia Dana, M.D.    Mr Brain Wo Contrast  10/11/2012  *RADIOLOGY REPORT*  Clinical Data: Increasing left-sided weakness.  Altered mental status  MRI HEAD WITHOUT CONTRAST  Technique:  Multiplanar, multiecho pulse sequences of the brain and surrounding structures were obtained according to standard protocol without intravenous contrast.  Comparison: CT 10/10/2012  Findings: Restricted diffusion in the right lateral thalamus and posterior limb internal capsule compatible with infarction.  MRI head 08/15/2012 revealed restricted diffusion throughout this area. It is possible there has been some slight extension of the infarct on the lateral margin, as diffusion restriction from the acute infarct on 08/15/2012 should have resolved by now.  No other areas of restricted diffusion are evident.  Chronic microvascular ischemic changes are present throughout the white matter and pons.  Micro hemorrhage left globus pallidus, right occipital lobe.  These may be due to chronic hypertension. No mass lesion is present.  Chronic sinusitis  IMPRESSION: Restricted diffusion in the right lateral thalamus and posterior limb internal capsule.  This area showed restricted diffusion on 08/15/2012.  This could be an area of extension of acute infarct since that time.  No other areas of acute infarct.  Moderate chronic microvascular ischemic changes.  Chronic sinusitis.   Original Report Authenticated By: Carl Best, M.D.    Dg Chest Portable 1 View  10/10/2012  *RADIOLOGY REPORT*  Clinical Data: Numbness  PORTABLE CHEST - 1 VIEW  Comparison: 08/14/2012  Findings: Heart size upper normal to mildly enlarged.  Mediastinal contours otherwise within normal range.  Lungs are predominately clear.  No pleural effusion or pneumothorax.  No acute osseous finding.  IMPRESSION: Heart size upper normal to mildly enlarged.  No acute process identified.   Original  Report Authenticated By: Carlos Levering, M.D.      Assessment & Plan:   Hospital follow up within 7 days of discharge Contact was made within 48h of discharge to arrange today's follow up See problem list. Medications and labs reviewed today.

## 2012-10-17 ENCOUNTER — Ambulatory Visit: Payer: Medicare Other | Admitting: Physical Therapy

## 2012-10-23 ENCOUNTER — Ambulatory Visit: Payer: Medicare Other | Admitting: *Deleted

## 2012-10-24 ENCOUNTER — Other Ambulatory Visit: Payer: Self-pay | Admitting: *Deleted

## 2012-10-24 MED ORDER — PRODIGY TWIST TOP LANCETS 28G MISC: Status: DC

## 2012-10-24 MED ORDER — GLUCOSE BLOOD VI STRP: ORAL_STRIP | Status: DC

## 2012-10-24 NOTE — Telephone Encounter (Signed)
Received fax stating pt states he check blood sugar more than once a day. Needing new rx stating correct frequency on his Prodigy lancets...[name]

## 2012-10-31 ENCOUNTER — Telehealth: Payer: Self-pay | Admitting: *Deleted

## 2012-10-31 NOTE — Telephone Encounter (Signed)
ok 

## 2012-10-31 NOTE — Telephone Encounter (Signed)
[facility] PT called for verbal order to extend [facility] visits until he starts outpatient PT in January.

## 2012-10-31 NOTE — Telephone Encounter (Signed)
Called back, number busy.

## 2012-11-01 ENCOUNTER — Telehealth: Payer: Self-pay | Admitting: *Deleted

## 2012-11-01 NOTE — Telephone Encounter (Signed)
[facility] RN informed of verbal order.

## 2012-11-01 NOTE — Telephone Encounter (Signed)
Called Jeff no answer LMOM md response..,.[name]

## 2012-11-01 NOTE — Telephone Encounter (Signed)
Number busy

## 2012-11-01 NOTE — Telephone Encounter (Signed)
Left msg on vm stating wanting to extend PT for additional 4 weeks. Is this ok...Johny Chess

## 2012-11-01 NOTE — Telephone Encounter (Signed)
ok 

## 2012-11-03 ENCOUNTER — Telehealth: Payer: Self-pay | Admitting: *Deleted

## 2012-11-03 NOTE — Telephone Encounter (Signed)
[facility] OT called requesting orders to extend OT for 3 weeks-okay for verbal order?

## 2012-11-04 NOTE — Telephone Encounter (Signed)
Lia Foyer, Ok for verbal order to extend OT. Thx! Rollene Fare

## 2012-11-06 ENCOUNTER — Other Ambulatory Visit: Payer: Self-pay | Admitting: Internal Medicine

## 2012-11-06 NOTE — Telephone Encounter (Signed)
[facility] OT informed of order via VM and to callback with any questions/concerns.

## 2012-11-07 ENCOUNTER — Other Ambulatory Visit: Payer: Self-pay | Admitting: *Deleted

## 2012-11-07 MED ORDER — ATENOLOL 25 MG PO TABS: 25.0000 mg | ORAL_TABLET | Freq: Every day | ORAL | Status: DC

## 2012-11-07 MED ORDER — PRAVASTATIN SODIUM 40 MG PO TABS: 40.0000 mg | ORAL_TABLET | Freq: Every day | ORAL | Status: DC

## 2012-11-07 NOTE — Telephone Encounter (Signed)
Ok to refill - print and i will sign to fax

## 2012-11-07 NOTE — Telephone Encounter (Signed)
Faxed script back to [facility].../lmb 

## 2012-11-07 NOTE — Telephone Encounter (Signed)
PATIENT CALLED AND ASKED FOR REFILL REQUEST FOR LYRICA. HE TRIED THE NEURONTIN  BUT IT MADE HIM FEEL NAUSEATED AND DID NOT TAKE. STATED HE WENT BACK ON LYRICA AND IS RUNNING OUT OF IT NOW. PLEASE ADVISE

## 2012-11-08 MED ORDER — PREGABALIN 100 MG PO CAPS: 100.0000 mg | ORAL_CAPSULE | Freq: Three times a day (TID) | ORAL | Status: DC

## 2012-11-08 NOTE — Telephone Encounter (Signed)
Tried calling pt several times to inform him rx was sent to [facility]. Could not leave msg due to vm being full....[name]

## 2012-11-09 DIAGNOSIS — G609 Hereditary and idiopathic neuropathy, unspecified: Secondary | ICD-10-CM

## 2012-11-09 DIAGNOSIS — I6350 Cerebral infarction due to unspecified occlusion or stenosis of unspecified cerebral artery: Secondary | ICD-10-CM

## 2012-11-09 DIAGNOSIS — I69959 Hemiplegia and hemiparesis following unspecified cerebrovascular disease affecting unspecified side: Secondary | ICD-10-CM

## 2012-11-09 DIAGNOSIS — I10 Essential (primary) hypertension: Secondary | ICD-10-CM

## 2012-11-09 DIAGNOSIS — IMO0001 Reserved for inherently not codable concepts without codable children: Secondary | ICD-10-CM

## 2012-11-10 ENCOUNTER — Telehealth: Payer: Self-pay | Admitting: Internal Medicine

## 2012-11-10 MED ORDER — POTASSIUM CHLORIDE CRYS ER 10 MEQ PO TBCR: 10.0000 meq | EXTENDED_RELEASE_TABLET | Freq: Two times a day (BID) | ORAL | Status: DC

## 2012-11-10 NOTE — Telephone Encounter (Signed)
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  Blood sugar 156 11/10/12 1230.  Denies symptoms currently.  States also that he has not had renewal of potassium Rx/KDur 71meq, 1 tab po BID.  Per protocol, info to office for provider review/callback.  May reach patient at [phone].  Symptoms  Reason For Call & Symptoms: Blood sugar dropped 11/08/12 and again 11/09/12 to 60 or 62.  States has occurred at ~ 1300 daily; most recently 1430 11/09/12, and then recovers to 100-140 after lunch.  Blood sugar on arising AM 11/10/12 was 165.  Has breakfast at 0930 or 1000 daily, and does not eat lunch until after 1400 daily.  Reviewed Health History In EMR: Yes  Reviewed Medications In EMR: Yes  Reviewed Allergies In EMR: Yes  Reviewed Surgeries / Procedures: Yes  Date of Onset of Symptoms: 11/08/2012  Guideline(s) Used:  Diabetes - Low Blood Sugar  Disposition Per Guideline:   Discuss with PCP and Callback by Nurse within 1 Hour  Reason For Disposition Reached:   Blood glucose < 70 mg/dl (3.9 mmol/l) or symptomatic AND cause unknown  Advice Given:  N/A

## 2012-11-10 NOTE — Telephone Encounter (Signed)
Stop lantus Continue TID novolog but hold dose if cbg<100 before the meal

## 2012-11-10 NOTE — Telephone Encounter (Signed)
Notified pt with md response.../lmb 

## 2012-11-10 NOTE — Telephone Encounter (Signed)
K-dur sent. Pls advise on elevated BS...Johny Chess

## 2012-11-21 ENCOUNTER — Other Ambulatory Visit: Payer: Self-pay

## 2012-11-21 ENCOUNTER — Other Ambulatory Visit: Payer: Self-pay | Admitting: *Deleted

## 2012-11-21 MED ORDER — HYDROCHLOROTHIAZIDE 12.5 MG PO CAPS: 12.5000 mg | ORAL_CAPSULE | ORAL | Status: DC

## 2012-11-21 NOTE — Telephone Encounter (Signed)
Pt states he is needing refill on his HCTZ pharmacy states they have fax md but haven't received response. Inform pt we haven't received an request will go ahead and send rx to [facility]...Johny Chess

## 2012-11-27 ENCOUNTER — Ambulatory Visit (INDEPENDENT_AMBULATORY_CARE_PROVIDER_SITE_OTHER): Payer: Medicare Other | Admitting: Internal Medicine

## 2012-11-27 ENCOUNTER — Encounter: Payer: Self-pay | Admitting: Internal Medicine

## 2012-11-27 VITALS — BP 110/72 | HR 67 | Temp 98.2°F | Ht 71.0 in | Wt 205.8 lb

## 2012-11-27 DIAGNOSIS — IMO0001 Reserved for inherently not codable concepts without codable children: Secondary | ICD-10-CM

## 2012-11-27 DIAGNOSIS — G629 Polyneuropathy, unspecified: Secondary | ICD-10-CM

## 2012-11-27 DIAGNOSIS — I69354 Hemiplegia and hemiparesis following cerebral infarction affecting left non-dominant side: Secondary | ICD-10-CM

## 2012-11-27 DIAGNOSIS — G609 Hereditary and idiopathic neuropathy, unspecified: Secondary | ICD-10-CM

## 2012-11-27 DIAGNOSIS — I10 Essential (primary) hypertension: Secondary | ICD-10-CM

## 2012-11-27 DIAGNOSIS — I69959 Hemiplegia and hemiparesis following unspecified cerebrovascular disease affecting unspecified side: Secondary | ICD-10-CM

## 2012-11-27 MED ORDER — PREGABALIN 100 MG PO CAPS: 100.0000 mg | ORAL_CAPSULE | Freq: Three times a day (TID) | ORAL | Status: DC

## 2012-11-27 MED ORDER — HYDROCODONE-ACETAMINOPHEN 5-325 MG PO TABS: 1.0000 | ORAL_TABLET | Freq: Three times a day (TID) | ORAL | Status: DC | PRN

## 2012-11-27 NOTE — Assessment & Plan Note (Signed)
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

## 2012-11-27 NOTE — Assessment & Plan Note (Signed)
Uncontrolled Changed to Lantus 03/2012 hosp for TIA , but stopped same 10/2012 due to hypoglycemia-  off metformin/januvia since 03/2012 Continue Novolog TID AC Also encouraged follow up with endo - ellison Lab Results  Component Value Date   HGBA1C 8.0* 10/11/2012

## 2012-11-27 NOTE — Progress Notes (Signed)
Subjective:    Patient ID: [name], male    DOB: 02-10-1951, 62 y.o.   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 Hx TIA with similar presentation in May 2013.  on aspirin + Plavix - Patient was not a TPA candidate secondary to major ENT surgery 07/2012. S/p CIR inpt rehab 07/2012 and ongoing [place].  pain managed with lyrica and hydrocodone  Dysphagia - felt to be multifactorial: CVA, ENT surgery and GI  Affects solids > liquids with regurgitation after meals -  s/p GI eval 10/13/12 - no other aggressive eval planned at this time associated with hoarseness and sensation of mucus/gagging/cough -  Also working with ENT for same, s/p laryngeal surgery 07/2012 (1 week prior to 1st CVA)  DM2 - insulin dep - checks cbgs 3-4x/day - denies hypoglycemia, but considerable variability - the patient reports compliance with medication(s) as prescribed. Denies adverse side effects.  left leg and hip pain with spasms since 07/2012 CVA - ?neuropathic from CVA History of similar symptoms with remote sciatica and ruptured lumbar disc improved with 6 day pred pak and baclofen TID in 07/2012, now on lyrica and hydrocodone - prior baclofen and tramadol no longer needed s/p ortho spine eval and ortho eval for same - also neuro OP and IP remains difficult to assess if strength affected because of recent right hemisphere CVA causing left hemiparesis and hemisensory deficits  Past Medical History  Diagnosis Date  . ALLERGIC RHINITIS   . CAD, NATIVE VESSEL     BMS to OM1 2001, DES to BMS 2005  . DIAB W/UNSPEC COMP TYPE II/UNSPEC TYPE UNCNTRL   . ERECTILE DYSFUNCTION   . HYPERLIPIDEMIA-MIXED   . MORTON'S NEUROMA, RIGHT   . SHOULDER PAIN, RIGHT   . HYPERTENSION, BENIGN   . Peripheral neuropathy   . Chronic back pain    herniated disc  . GERD   . Constipation     takes Carafate four times day  . Seasonal allergies     takes Allegra and Benadryl daily prn;uses Flonase daily  . TIA on medication 02/2012  . Stroke, thrombotic 07/2012    L HP + hemiparesis, s/p CIR     Review of Systems  Constitutional: Positive for fatigue. Negative for fever and unexpected weight change.  Respiratory: Negative for cough and shortness of breath.   Cardiovascular: Negative for chest pain.  Musculoskeletal: Positive for gait problem. Negative for back pain and joint swelling.  Neurological: Positive for tremors (LLE, worse at night and after exertion) and weakness. Negative for seizures, speech difficulty and headaches.       Objective:   Physical Exam  BP 110/72  Pulse 67  Temp 98.2 F (36.8 C) (Oral)  Ht 5\' 11"  (1.803 m)  Wt 205 lb 12.8 oz (93.35 kg)  BMI 28.70 kg/m2  SpO2 97% Wt Readings from Last 3 Encounters:  11/27/12 205 lb 12.8 oz (93.35 kg)  10/16/12 198 lb (89.812 kg)  10/11/12 180 lb 5.4 oz (81.8 kg)   Constitutional:  He appears well-developed and well-nourished. No distress. Wife at side Neck: Normal range of motion. Neck supple. No JVD or LAD present. No thyromegaly present.  Cardiovascular: Normal rate, regular rhythm and normal heart sounds.  No murmur heard. no BLE edema Pulmonary/Chest: Effort normal and breath sounds normal. No respiratory  distress. no wheezes.  Neurologic: min L facial paralysis, no significant dysarthria, no aphasia - LUE with mild hemiparesis and poor coordination/poor fine motor control MSkel: L hip with full ROM, nontender to groin palpation. Tender to ischial bursa on L -Back: full range of motion of thoracic and lumbar spine. Non tender to palpation. no residual involuntary spasm of L hip flexors at this time. patient ambulates with antalgic gait, aides with RW.  Psychiatric: he has a normal mood and affect. behavior is normal. Judgment and thought content normal.   Lab  Results  Component Value Date   WBC 6.1 10/10/2012   HGB 12.4* 10/10/2012   HCT 35.2* 10/10/2012   PLT 200 10/10/2012   GLUCOSE 80 10/10/2012   CHOL 90 08/15/2012   TRIG 59 08/15/2012   HDL 52 08/15/2012   LDLDIRECT 112.2 02/25/2010   LDLCALC 26 08/15/2012   ALT 30 10/10/2012   AST 25 10/10/2012   NA 138 10/10/2012   K 3.3* 10/10/2012   CL 103 10/10/2012   CREATININE 1.23 10/10/2012   BUN 19 10/10/2012   CO2 26 10/10/2012   PSA 0.16 04/13/2010   INR 1.06 08/14/2012   HGBA1C 8.0* 10/11/2012   Ct Head Wo Contrast  10/10/2012  *RADIOLOGY REPORT*  Clinical Data: Left side numbness, history of stroke with left side paralysis  CT HEAD WITHOUT CONTRAST  Technique:  Contiguous axial images were obtained from the base of the skull through the vertex without contrast.  Comparison: 09/20/2012  Findings: Normal ventricular morphology. No midline shift or mass effect. Small vessel chronic ischemic changes of deep cerebral white matter. Old infarct identified at posterior right internal capsule and right thalamus. No intracranial hemorrhage, mass lesion, or evidence of acute infarction. No extra-axial fluid collections. Bones and sinuses unremarkable.  IMPRESSION: Small vessel chronic ischemic changes of deep cerebral white matter. Old infarct posterior limb right internal capsule and right thalamus. No definite acute intracranial abnormalities.   Original Report Authenticated By: Lavonia Dana, M.D.    Mr Brain Wo Contrast  10/11/2012  *RADIOLOGY REPORT*  Clinical Data: Increasing left-sided weakness.  Altered mental status  MRI HEAD WITHOUT CONTRAST  Technique:  Multiplanar, multiecho pulse sequences of the brain and surrounding structures were obtained according to standard protocol without intravenous contrast.  Comparison: CT 10/10/2012  Findings: Restricted diffusion in the right lateral thalamus and posterior limb internal capsule compatible with infarction.  MRI head 08/15/2012 revealed restricted  diffusion throughout this area. It is possible there has been some slight extension of the infarct on the lateral margin, as diffusion restriction from the acute infarct on 08/15/2012 should have resolved by now.  No other areas of restricted diffusion are evident.  Chronic microvascular ischemic changes are present throughout the white matter and pons.  Micro hemorrhage left globus pallidus, right occipital lobe.  These may be due to chronic hypertension. No mass lesion is present.  Chronic sinusitis  IMPRESSION: Restricted diffusion in the right lateral thalamus and posterior limb internal capsule.  This area showed restricted diffusion on 08/15/2012.  This could be an area of extension of acute infarct since that time.  No other areas of acute infarct.  Moderate chronic microvascular ischemic changes.  Chronic sinusitis.   Original Report Authenticated By: Carl Best, M.D.    Dg Chest Portable 1 View  10/10/2012  *RADIOLOGY REPORT*  Clinical Data: Numbness  PORTABLE CHEST - 1 VIEW  Comparison: 08/14/2012  Findings: Heart size upper normal to mildly enlarged.  Mediastinal contours otherwise  within normal range.  Lungs are predominately clear.  No pleural effusion or pneumothorax.  No acute osseous finding.  IMPRESSION: Heart size upper normal to mildly enlarged.  No acute process identified.   Original Report Authenticated By: Carlos Levering, M.D.      Assessment & Plan:   See problem list. Medications and labs reviewed today.  Time spent with pt/family today 25 minutes, greater than 50% time spent counseling patient on diabetes, CVA/HHP and medication review. Also review of prior records

## 2012-11-27 NOTE — Patient Instructions (Signed)
It was good to see you today. We have reviewed your prior records including labs and tests today Medications reviewed and updated - no changes recommended  Refill on medication(s) as discussed today. Your prescription(s) have been submitted to your pharmacy. Please take as directed and contact our office if you believe you are having problem(s) with the medication(s). Keep followup with other specialists and therapy as scheduled Please schedule followup in 2 months for diabetes mellitus labs and blood pressure recheck, call sooner if problems.

## 2012-11-27 NOTE — Assessment & Plan Note (Signed)
CVA 07/2012 and 09/2012 -associated with left hemiparesis and numbness/paresthesia Following with neurology for same, continue medical management including risk factor control -see hypertension below Hx TIA 03/2012 with similar symptoms- hospitalization at that time unremarkable for CVA -  control of risk factors addressed and importance of compliance reviewed Changed Plavix from ASA 03/2012 hosp - continue same Continue PT/Ot - now OP planned s/p HH and CIR

## 2012-11-27 NOTE — Assessment & Plan Note (Signed)
changed coreg to low dose atenolol 03/2012 Added amlodipine early 09/2012 - improved follow up cards as planned   BP Readings from Last 3 Encounters:  11/27/12 110/72  10/16/12 128/72  10/13/12 110/60  The current medical regimen is effective;  continue present plan and medications.

## 2012-11-29 ENCOUNTER — Ambulatory Visit: Payer: Medicare Other | Admitting: Physical Therapy

## 2012-11-29 ENCOUNTER — Ambulatory Visit: Payer: Medicare Other | Attending: Neurology | Admitting: Occupational Therapy

## 2012-11-29 DIAGNOSIS — R269 Unspecified abnormalities of gait and mobility: Secondary | ICD-10-CM | POA: Insufficient documentation

## 2012-11-29 DIAGNOSIS — R279 Unspecified lack of coordination: Secondary | ICD-10-CM | POA: Insufficient documentation

## 2012-11-29 DIAGNOSIS — IMO0001 Reserved for inherently not codable concepts without codable children: Secondary | ICD-10-CM | POA: Insufficient documentation

## 2012-11-29 DIAGNOSIS — I69959 Hemiplegia and hemiparesis following unspecified cerebrovascular disease affecting unspecified side: Secondary | ICD-10-CM | POA: Insufficient documentation

## 2012-12-03 ENCOUNTER — Other Ambulatory Visit: Payer: Self-pay | Admitting: Internal Medicine

## 2012-12-03 ENCOUNTER — Other Ambulatory Visit: Payer: Self-pay | Admitting: Endocrinology

## 2012-12-05 ENCOUNTER — Ambulatory Visit: Payer: Medicare Other | Admitting: Occupational Therapy

## 2012-12-05 ENCOUNTER — Ambulatory Visit: Payer: Medicare Other | Admitting: Rehabilitative and Restorative Service Providers"

## 2012-12-07 ENCOUNTER — Ambulatory Visit: Payer: Medicare Other | Admitting: Occupational Therapy

## 2012-12-07 ENCOUNTER — Ambulatory Visit: Payer: Medicare Other | Admitting: Physical Therapy

## 2012-12-13 ENCOUNTER — Ambulatory Visit: Payer: Medicare Other | Admitting: *Deleted

## 2012-12-13 ENCOUNTER — Ambulatory Visit: Payer: Medicare Other | Admitting: Physical Therapy

## 2012-12-15 ENCOUNTER — Ambulatory Visit: Payer: Medicare Other | Admitting: Physical Therapy

## 2012-12-15 ENCOUNTER — Ambulatory Visit: Payer: Medicare Other | Admitting: *Deleted

## 2012-12-20 ENCOUNTER — Ambulatory Visit: Payer: Medicare Other | Admitting: Occupational Therapy

## 2012-12-20 ENCOUNTER — Ambulatory Visit: Payer: Medicare Other | Admitting: Physical Therapy

## 2012-12-22 ENCOUNTER — Ambulatory Visit: Payer: Medicare Other | Admitting: Physical Therapy

## 2012-12-22 ENCOUNTER — Ambulatory Visit: Payer: Medicare Other | Admitting: Occupational Therapy

## 2012-12-25 ENCOUNTER — Ambulatory Visit: Payer: Medicare Other | Attending: Neurology | Admitting: Physical Therapy

## 2012-12-25 ENCOUNTER — Ambulatory Visit: Payer: Medicare Other | Admitting: Occupational Therapy

## 2012-12-25 DIAGNOSIS — I69959 Hemiplegia and hemiparesis following unspecified cerebrovascular disease affecting unspecified side: Secondary | ICD-10-CM | POA: Insufficient documentation

## 2012-12-25 DIAGNOSIS — R269 Unspecified abnormalities of gait and mobility: Secondary | ICD-10-CM | POA: Insufficient documentation

## 2012-12-25 DIAGNOSIS — IMO0001 Reserved for inherently not codable concepts without codable children: Secondary | ICD-10-CM | POA: Insufficient documentation

## 2012-12-25 DIAGNOSIS — R279 Unspecified lack of coordination: Secondary | ICD-10-CM | POA: Insufficient documentation

## 2012-12-27 ENCOUNTER — Other Ambulatory Visit: Payer: Self-pay | Admitting: Internal Medicine

## 2012-12-28 ENCOUNTER — Ambulatory Visit: Payer: Medicare Other | Admitting: Physical Therapy

## 2012-12-28 ENCOUNTER — Ambulatory Visit: Payer: Medicare Other | Admitting: Occupational Therapy

## 2013-01-01 ENCOUNTER — Ambulatory Visit: Payer: Medicare Other | Admitting: Occupational Therapy

## 2013-01-01 ENCOUNTER — Ambulatory Visit: Payer: Medicare Other | Admitting: Physical Therapy

## 2013-01-04 ENCOUNTER — Ambulatory Visit: Payer: Medicare Other | Admitting: Occupational Therapy

## 2013-01-04 ENCOUNTER — Ambulatory Visit: Payer: Medicare Other | Admitting: Physical Therapy

## 2013-01-09 ENCOUNTER — Ambulatory Visit: Payer: Medicare Other | Admitting: Physical Therapy

## 2013-01-09 ENCOUNTER — Ambulatory Visit: Payer: Medicare Other | Admitting: Occupational Therapy

## 2013-01-11 ENCOUNTER — Ambulatory Visit: Payer: Medicare Other | Admitting: Occupational Therapy

## 2013-01-11 ENCOUNTER — Ambulatory Visit: Payer: Medicare Other | Admitting: Physical Therapy

## 2013-01-12 ENCOUNTER — Telehealth: Payer: Self-pay | Admitting: *Deleted

## 2013-01-12 DIAGNOSIS — M216X9 Other acquired deformities of unspecified foot: Secondary | ICD-10-CM

## 2013-01-12 NOTE — Telephone Encounter (Signed)
Needing an order for AFO, rigid ant tib & last ov notes fax to them as well. Generated & fax info to [phone]...[name]

## 2013-01-17 ENCOUNTER — Ambulatory Visit: Payer: Medicare Other | Admitting: Physical Therapy

## 2013-01-17 ENCOUNTER — Ambulatory Visit: Payer: Medicare Other | Admitting: Occupational Therapy

## 2013-01-18 ENCOUNTER — Ambulatory Visit: Payer: Medicare Other | Admitting: Occupational Therapy

## 2013-01-18 ENCOUNTER — Ambulatory Visit: Payer: Medicare Other | Admitting: Physical Therapy

## 2013-01-19 ENCOUNTER — Ambulatory Visit: Payer: Medicare Other | Admitting: Internal Medicine

## 2013-01-23 ENCOUNTER — Ambulatory Visit: Payer: Medicare Other | Admitting: Occupational Therapy

## 2013-01-23 ENCOUNTER — Ambulatory Visit: Payer: Medicare Other | Admitting: Physical Therapy

## 2013-01-25 ENCOUNTER — Ambulatory Visit: Payer: Medicare Other | Admitting: Physical Therapy

## 2013-01-25 ENCOUNTER — Ambulatory Visit: Payer: Medicare Other | Admitting: Occupational Therapy

## 2013-01-26 ENCOUNTER — Ambulatory Visit: Payer: Medicare Other | Admitting: Internal Medicine

## 2013-01-30 ENCOUNTER — Ambulatory Visit: Payer: Medicare Other | Admitting: Occupational Therapy

## 2013-01-30 ENCOUNTER — Other Ambulatory Visit (INDEPENDENT_AMBULATORY_CARE_PROVIDER_SITE_OTHER): Payer: Medicare Other

## 2013-01-30 ENCOUNTER — Ambulatory Visit (INDEPENDENT_AMBULATORY_CARE_PROVIDER_SITE_OTHER): Payer: Medicare Other | Admitting: Internal Medicine

## 2013-01-30 ENCOUNTER — Ambulatory Visit: Payer: Medicare Other | Attending: Neurology | Admitting: Physical Therapy

## 2013-01-30 ENCOUNTER — Encounter: Payer: Self-pay | Admitting: Internal Medicine

## 2013-01-30 VITALS — BP 142/88 | HR 81 | Temp 97.0°F | Wt 201.8 lb

## 2013-01-30 DIAGNOSIS — J309 Allergic rhinitis, unspecified: Secondary | ICD-10-CM

## 2013-01-30 DIAGNOSIS — E1149 Type 2 diabetes mellitus with other diabetic neurological complication: Secondary | ICD-10-CM

## 2013-01-30 DIAGNOSIS — E1142 Type 2 diabetes mellitus with diabetic polyneuropathy: Secondary | ICD-10-CM

## 2013-01-30 DIAGNOSIS — R279 Unspecified lack of coordination: Secondary | ICD-10-CM | POA: Insufficient documentation

## 2013-01-30 DIAGNOSIS — IMO0001 Reserved for inherently not codable concepts without codable children: Secondary | ICD-10-CM | POA: Insufficient documentation

## 2013-01-30 DIAGNOSIS — E1140 Type 2 diabetes mellitus with diabetic neuropathy, unspecified: Secondary | ICD-10-CM

## 2013-01-30 DIAGNOSIS — I69959 Hemiplegia and hemiparesis following unspecified cerebrovascular disease affecting unspecified side: Secondary | ICD-10-CM

## 2013-01-30 DIAGNOSIS — R269 Unspecified abnormalities of gait and mobility: Secondary | ICD-10-CM | POA: Insufficient documentation

## 2013-01-30 DIAGNOSIS — I69354 Hemiplegia and hemiparesis following cerebral infarction affecting left non-dominant side: Secondary | ICD-10-CM

## 2013-01-30 DIAGNOSIS — I10 Essential (primary) hypertension: Secondary | ICD-10-CM

## 2013-01-30 LAB — MICROALBUMIN / CREATININE URINE RATIO
Creatinine,U: 357.6 mg/dL
Microalb Creat Ratio: 2.4 mg/g (ref 0.0–30.0)
Microalb, Ur: 8.7 mg/dL — ABNORMAL HIGH (ref 0.0–1.9)

## 2013-01-30 LAB — HEMOGLOBIN A1C: Hgb A1c MFr Bld: 7.5 % — ABNORMAL HIGH (ref 4.6–6.5)

## 2013-01-30 MED ORDER — LOSARTAN POTASSIUM 25 MG PO TABS: 25.0000 mg | ORAL_TABLET | Freq: Every day | ORAL | Status: DC

## 2013-01-30 MED ORDER — FLUTICASONE PROPIONATE 50 MCG/ACT NA SUSP: 2.0000 | Freq: Every day | NASAL | Status: DC

## 2013-01-30 MED ORDER — CETIRIZINE HCL 10 MG PO TABS: 10.0000 mg | ORAL_TABLET | Freq: Every day | ORAL | Status: DC

## 2013-01-30 NOTE — Progress Notes (Signed)
Subjective:    Patient ID: [name], male    DOB: Jan 11, 1951, 62 y.o.   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 Hx TIA with similar presentation in May 2013.  on aspirin + Plavix - Patient was not a TPA candidate secondary to major ENT surgery 07/2012. S/p CIR inpt rehab 07/2012 and ongoing [place].  pain managed with hydrocodone, lyrica stopped due to side effects   Dysphagia - felt to be multifactorial: CVA, ENT surgery and GI  Affects solids > liquids with regurgitation after meals -  s/p GI eval 10/13/12 - no other aggressive eval planned at this time associated with hoarseness and sensation of mucus/gagging/cough -  Also working with ENT for same, s/p laryngeal surgery 07/2012 (1 week prior to 1st CVA)  DM2 - insulin dep - checks cbgs 3-4x/day - denies hypoglycemia, but considerable variability - the patient reports compliance with medication(s) as prescribed. Denies adverse side effects.  left leg and hip pain with spasms since 07/2012 CVA - ?neuropathic from CVA History of similar symptoms with remote sciatica and ruptured lumbar disc Initially improved with 6 day pred pak and baclofen TID in 07/2012, then lyrica and hydrocodone -  Off lyrica due to side effects, considering botox injection with Dr Leonie Man - ongoing baclofen bid + prn  s/p ortho spine eval and ortho eval for same - also neuro OP and IP remains difficult to assess if strength affected because of recent right hemisphere CVA causing left hemiparesis and hemisensory deficits  Past Medical History  Diagnosis Date  . ALLERGIC RHINITIS   . CAD, NATIVE VESSEL     BMS to OM1 2001, DES to BMS 2005  . DIAB W/UNSPEC COMP TYPE II/UNSPEC TYPE UNCNTRL   . ERECTILE DYSFUNCTION   . HYPERLIPIDEMIA-MIXED   . MORTON'S NEUROMA, RIGHT   .  SHOULDER PAIN, RIGHT   . HYPERTENSION, BENIGN   . Peripheral neuropathy   . Chronic back pain     herniated disc  . GERD   . Constipation     takes Carafate four times day  . Seasonal allergies     takes Allegra and Benadryl daily prn;uses Flonase daily  . TIA on medication 02/2012  . Stroke, thrombotic 07/2012    L HP + hemiparesis, s/p CIR     Review of Systems  Constitutional: Positive for fatigue. Negative for fever and unexpected weight change.  Respiratory: Negative for cough and shortness of breath.   Cardiovascular: Negative for chest pain.  Musculoskeletal: Positive for gait problem. Negative for back pain and joint swelling.  Neurological: Positive for tremors (LLE, worse at night and after exertion) and weakness. Negative for seizures, speech difficulty and headaches.       Objective:   Physical Exam  BP 142/88  Pulse 81  Temp(Src) 97 F (36.1 C) (Oral)  Wt 201 lb 12.8 oz (91.536 kg)  BMI 28.16 kg/m2  SpO2 98% Wt Readings from Last 3 Encounters:  01/30/13 201 lb 12.8 oz (91.536 kg)  11/27/12 205 lb 12.8 oz (93.35 kg)  10/16/12 198 lb (89.812 kg)   Constitutional:  He appears well-developed and well-nourished. No distress. dtr at side Neck: Normal range of motion. Neck supple. No JVD or LAD present. No thyromegaly present.  Cardiovascular: Normal rate, regular rhythm and normal heart sounds.  No  murmur heard. no BLE edema Pulmonary/Chest: Effort normal and breath sounds normal. No respiratory distress. no wheezes.  Neurologic: min L facial paralysis, no significant dysarthria, no aphasia - LUE with mild hemiparesis and poor coordination/poor fine motor control MSkel: L hip with full ROM, nontender to groin palpation. Tender to ischial bursa on L -Back: full range of motion of thoracic and lumbar spine. Non tender to palpation. no residual involuntary spasm of L hip flexors at this time. patient ambulates with antalgic gait, aides with RW.  Psychiatric: he has a  normal mood and affect. behavior is normal. Judgment and thought content normal.   Lab Results  Component Value Date   WBC 6.1 10/10/2012   HGB 12.4* 10/10/2012   HCT 35.2* 10/10/2012   PLT 200 10/10/2012   GLUCOSE 80 10/10/2012   CHOL 90 08/15/2012   TRIG 59 08/15/2012   HDL 52 08/15/2012   LDLDIRECT 112.2 02/25/2010   LDLCALC 26 08/15/2012   ALT 30 10/10/2012   AST 25 10/10/2012   NA 138 10/10/2012   K 3.3* 10/10/2012   CL 103 10/10/2012   CREATININE 1.23 10/10/2012   BUN 19 10/10/2012   CO2 26 10/10/2012   PSA 0.16 04/13/2010   INR 1.06 08/14/2012   HGBA1C 8.0* 10/11/2012    Mr Brain Wo Contrast  10/11/2012  *RADIOLOGY REPORT*  Clinical Data: Increasing left-sided weakness.  Altered mental status  MRI HEAD WITHOUT CONTRAST  Technique:  Multiplanar, multiecho pulse sequences of the brain and surrounding structures were obtained according to standard protocol without intravenous contrast.  Comparison: CT 10/10/2012  Findings: Restricted diffusion in the right lateral thalamus and posterior limb internal capsule compatible with infarction.  MRI head 08/15/2012 revealed restricted diffusion throughout this area. It is possible there has been some slight extension of the infarct on the lateral margin, as diffusion restriction from the acute infarct on 08/15/2012 should have resolved by now.  No other areas of restricted diffusion are evident.  Chronic microvascular ischemic changes are present throughout the white matter and pons.  Micro hemorrhage left globus pallidus, right occipital lobe.  These may be due to chronic hypertension. No mass lesion is present.  Chronic sinusitis  IMPRESSION: Restricted diffusion in the right lateral thalamus and posterior limb internal capsule.  This area showed restricted diffusion on 08/15/2012.  This could be an area of extension of acute infarct since that time.  No other areas of acute infarct.  Moderate chronic microvascular ischemic changes.  Chronic  sinusitis.   Original Report Authenticated By: Carl Best, M.D.         Assessment & Plan:   See problem list. Medications and labs reviewed today.

## 2013-01-30 NOTE — Assessment & Plan Note (Signed)
Uncontrolled Changed to Lantus 03/2012 hosp for TIA , but stopped same 10/2012 due to hypoglycemia-  off metformin/januvia since 03/2012 Continue Novolog TID AC - check a1c q3-61mo and titrate as needed Also encouraged follow up with endo - ellison On statin, check microalb and consider ARB (allg to ACEI)  Lab Results  Component Value Date   HGBA1C 8.0* 10/11/2012

## 2013-01-30 NOTE — Assessment & Plan Note (Signed)
Increase seasonal symptoms - change antihistamine and start Flonase - erx done

## 2013-01-30 NOTE — Assessment & Plan Note (Signed)
changed coreg to low dose atenolol 03/2012 Added amlodipine early 09/2012 - improved, but not a goal today Add low dose ARB (also for DM) follow up cards as planned   BP Readings from Last 3 Encounters:  01/30/13 142/88  11/27/12 110/72  10/16/12 128/72

## 2013-01-30 NOTE — Patient Instructions (Signed)
It was good to see you today. We have reviewed your prior records including labs and tests today Medications reviewed and updated - start losartan 25mg  daily for kidney protection and blood pressure, also flonase spray for sinus and allergy symptoms and change Allegra to Zyrtec daily - no other changes recommended  Your prescription(s) have been submitted to your pharmacy. Please take as directed and contact our office if you believe you are having problem(s) with the medication(s). Keep followup with other specialists and therapy as scheduled Please schedule followup in 6 months for diabetes mellitus labs and blood pressure recheck, call sooner if problems.

## 2013-01-30 NOTE — Assessment & Plan Note (Signed)
CVA 07/2012 and 09/2012 -associated with left hemiparesis and numbness/paresthesia Following with neurology for same, continue medical management including risk factor control -see hypertension below Hx TIA 03/2012 with similar symptoms- hospitalization at that time unremarkable for CVA -  control of risk factors addressed and importance of compliance reviewed Changed Plavix from ASA 03/2012 hosp - continue same Continue OP PT/OT - s/p HH and CIR

## 2013-02-01 ENCOUNTER — Ambulatory Visit: Payer: Medicare Other | Admitting: Occupational Therapy

## 2013-02-01 ENCOUNTER — Ambulatory Visit: Payer: Medicare Other | Admitting: Physical Therapy

## 2013-02-06 ENCOUNTER — Ambulatory Visit: Payer: Medicare Other | Admitting: Physical Therapy

## 2013-02-06 ENCOUNTER — Ambulatory Visit: Payer: Medicare Other | Admitting: Occupational Therapy

## 2013-02-08 ENCOUNTER — Ambulatory Visit: Payer: Medicare Other | Admitting: Occupational Therapy

## 2013-02-08 ENCOUNTER — Ambulatory Visit: Payer: Medicare Other | Admitting: Physical Therapy

## 2013-02-13 ENCOUNTER — Other Ambulatory Visit: Payer: Self-pay | Admitting: *Deleted

## 2013-02-13 MED ORDER — INSULIN ASPART 100 UNIT/ML ~~LOC~~ SOLN: SUBCUTANEOUS | Status: DC

## 2013-02-14 ENCOUNTER — Ambulatory Visit: Payer: Medicare Other | Admitting: Occupational Therapy

## 2013-02-14 ENCOUNTER — Ambulatory Visit: Payer: Medicare Other | Admitting: Physical Therapy

## 2013-02-15 ENCOUNTER — Ambulatory Visit: Payer: Medicare Other | Admitting: Physical Therapy

## 2013-02-15 ENCOUNTER — Ambulatory Visit: Payer: Medicare Other | Admitting: Occupational Therapy

## 2013-02-20 ENCOUNTER — Ambulatory Visit: Payer: Medicare Other | Admitting: Physical Therapy

## 2013-02-20 ENCOUNTER — Ambulatory Visit: Payer: Medicare Other | Attending: Neurology | Admitting: Occupational Therapy

## 2013-02-20 DIAGNOSIS — I69959 Hemiplegia and hemiparesis following unspecified cerebrovascular disease affecting unspecified side: Secondary | ICD-10-CM | POA: Insufficient documentation

## 2013-02-20 DIAGNOSIS — R279 Unspecified lack of coordination: Secondary | ICD-10-CM | POA: Insufficient documentation

## 2013-02-20 DIAGNOSIS — IMO0001 Reserved for inherently not codable concepts without codable children: Secondary | ICD-10-CM | POA: Insufficient documentation

## 2013-02-20 DIAGNOSIS — R269 Unspecified abnormalities of gait and mobility: Secondary | ICD-10-CM | POA: Insufficient documentation

## 2013-02-22 ENCOUNTER — Ambulatory Visit: Payer: Medicare Other | Admitting: Physical Therapy

## 2013-02-22 ENCOUNTER — Ambulatory Visit: Payer: Medicare Other | Admitting: Occupational Therapy

## 2013-02-27 ENCOUNTER — Ambulatory Visit: Payer: Medicare Other | Admitting: Occupational Therapy

## 2013-02-27 ENCOUNTER — Ambulatory Visit: Payer: Medicare Other | Admitting: Physical Therapy

## 2013-03-01 ENCOUNTER — Ambulatory Visit: Payer: Medicare Other | Admitting: Physical Therapy

## 2013-03-01 ENCOUNTER — Ambulatory Visit: Payer: Medicare Other | Admitting: Occupational Therapy

## 2013-03-02 ENCOUNTER — Telehealth: Payer: Self-pay | Admitting: Internal Medicine

## 2013-03-02 NOTE — Telephone Encounter (Signed)
Form place on md desk,,,lmb

## 2013-03-02 NOTE — Telephone Encounter (Signed)
Left Patient a message on the phone he stated to call on, letting him know that Dr. Asa Lente was not in today and that it would be Monday before we could get an RX completed.

## 2013-03-02 NOTE — Telephone Encounter (Signed)
Patient needs RX for physical Therapy.  Will bring papers back.  Program starts Monday.  Would like RX before then.  Please give Patient a call in this matter.

## 2013-03-05 ENCOUNTER — Ambulatory Visit: Payer: Medicare Other | Admitting: Physical Therapy

## 2013-03-05 ENCOUNTER — Encounter: Payer: Medicare Other | Admitting: Occupational Therapy

## 2013-03-05 ENCOUNTER — Ambulatory Visit: Payer: Medicare Other | Admitting: Occupational Therapy

## 2013-03-05 NOTE — Telephone Encounter (Signed)
Signed - returned to [facility] - thanks

## 2013-03-05 NOTE — Telephone Encounter (Signed)
Faxed form back to  [facility] for PT...lmb

## 2013-03-07 ENCOUNTER — Ambulatory Visit: Payer: Medicare Other | Admitting: Occupational Therapy

## 2013-03-07 ENCOUNTER — Ambulatory Visit: Payer: Medicare Other | Admitting: Physical Therapy

## 2013-03-12 ENCOUNTER — Encounter: Payer: Medicare Other | Admitting: Occupational Therapy

## 2013-03-12 ENCOUNTER — Ambulatory Visit: Payer: Medicare Other | Admitting: Physical Therapy

## 2013-03-12 ENCOUNTER — Ambulatory Visit: Payer: Medicare Other | Admitting: *Deleted

## 2013-03-14 ENCOUNTER — Ambulatory Visit: Payer: Medicare Other | Admitting: Physical Therapy

## 2013-03-14 ENCOUNTER — Ambulatory Visit: Payer: Medicare Other | Admitting: Occupational Therapy

## 2013-03-19 ENCOUNTER — Ambulatory Visit: Payer: Medicare Other | Admitting: Physical Therapy

## 2013-03-19 ENCOUNTER — Encounter: Payer: Medicare Other | Admitting: Occupational Therapy

## 2013-03-19 ENCOUNTER — Ambulatory Visit: Payer: Medicare Other | Admitting: Occupational Therapy

## 2013-03-21 ENCOUNTER — Ambulatory Visit: Payer: Medicare Other | Admitting: Physical Therapy

## 2013-03-21 ENCOUNTER — Ambulatory Visit: Payer: Medicare Other | Admitting: Occupational Therapy

## 2013-03-26 ENCOUNTER — Ambulatory Visit: Payer: Medicare Other | Admitting: Physical Therapy

## 2013-03-26 ENCOUNTER — Encounter: Payer: Medicare Other | Admitting: Occupational Therapy

## 2013-03-26 ENCOUNTER — Ambulatory Visit: Payer: Medicare Other | Admitting: Occupational Therapy

## 2013-03-27 ENCOUNTER — Ambulatory Visit (INDEPENDENT_AMBULATORY_CARE_PROVIDER_SITE_OTHER): Payer: Medicare Other | Admitting: Neurology

## 2013-03-27 ENCOUNTER — Encounter: Payer: Self-pay | Admitting: Neurology

## 2013-03-27 VITALS — BP 152/93 | HR 87 | Temp 98.7°F | Ht 71.0 in | Wt 205.0 lb

## 2013-03-27 DIAGNOSIS — G8110 Spastic hemiplegia affecting unspecified side: Secondary | ICD-10-CM

## 2013-03-27 NOTE — Patient Instructions (Signed)
Continue Plavix for secondary stroke prevention.Strict control of hypertension with blood pressure goal below 120/80, lipids with LDL cholesterol goal below 70 mg percent and   diabetes with Hb A1c goal below 6.5%. Return for followup in 6 months with Jeani Hawking, nurse practitioner

## 2013-03-27 NOTE — Progress Notes (Signed)
[facility] [address] [place]. [place] [address] [phone]       OFFICE FOLLOW-UP NOTE  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 Vascular risk factors of hypertension, hyperlipidemia, diabetes , cerebrovascular disease and coronary artery disease He returns for followup of her last visit on 12/26/12. He states he continues to show improvement and has finished physical therapy last week. He plans to join [employer] for [employer]. He is walking with a cane and a left foot AFO. He also sees a reflexologist for improving his feelings and balance. He states his sugars are not well controlled with fasting glucoses running in the 150-160 range. His blood pressure usually runs in the 130s at home though it is elevated at 152/93 in office today. His last hemoglobin A1c was 7.5 in February 2014. He has discontinued the baclofen which in fact was making his leg spasms worse as well as Lyrica which did not help assist his pain. He does not want to try any new medications for the same at the present time.  ROS:   14 system review of systems is positive for feeling cold, joint pain and swelling, cramps, aching muscle, trouble swallowing, itching, importance, runny nose, memory loss, numbness and weakness  PMH:  Past Medical History  Diagnosis Date  . ALLERGIC RHINITIS   . CAD, NATIVE VESSEL     BMS to OM1 2001, DES to BMS 2005  . DIAB W/UNSPEC COMP TYPE II/UNSPEC TYPE UNCNTRL   . ERECTILE DYSFUNCTION   . HYPERLIPIDEMIA-MIXED   . MORTON'S NEUROMA, RIGHT   . SHOULDER PAIN, RIGHT   . HYPERTENSION, BENIGN   . Peripheral neuropathy   . Chronic back pain     herniated disc  . GERD   . Constipation     takes Carafate four times day  . Seasonal  allergies     takes Allegra and Benadryl daily prn;uses Flonase daily  . TIA on medication 02/2012  . 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 Marital Status: Legally Separated    Spouse Name: N/A    Number of Children: N/A  . Years of Education: N/A   Occupational History  . disabled    Social History Main Topics  . Smoking status: Former Smoker    Quit date: 02/24/1983  . Smokeless tobacco: Never Used     Comment: Married, lives in [place] with wife. He is disabled secondary to back pain. 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 Alcohol Use: No  . Drug Use: No  . Sexually Active: Not on file   Other Topics Concern  . Not on file   Social History Narrative  . No narrative on file    Medications:   Current Outpatient Prescriptions on File Prior to Visit  Medication Sig Dispense Refill  . amLODipine (NORVASC) 5 MG tablet Take 5 mg by mouth daily.   30 tablet  11  . atenolol (TENORMIN) 25 MG tablet Take 1 tablet (25 mg total) by mouth daily.  30 tablet  5  . cetirizine (ZYRTEC) 10 MG tablet Take 1 tablet (10 mg total) by mouth daily.  30 tablet  11  . clopidogrel (PLAVIX) 75 MG  tablet Take 75 mg by mouth every evening.       . feeding supplement (GLUCERNA SHAKE) LIQD Take 237 mLs by mouth 3 (three) times daily between meals.  30 Can  0  . glucose blood (PRODIGY TEST) test strip Use to check blood sugars four times a day. Dx 250.02  120 each  5  . hydrochlorothiazide (MICROZIDE) 12.5 MG capsule Take 1 capsule (12.5 mg total) by mouth every morning.  30 capsule  5  . insulin aspart (NOVOLOG FLEXPEN) 100 UNIT/ML injection INJECT 10 UNITS INTO SKIN 3 (THREE) TIMES DAILY BEFORE MEALS.  9 mL  0  . losartan (COZAAR) 25 MG tablet Take 1 tablet (25 mg total) by mouth daily.  90 tablet  3  . Menthol, Topical Analgesic, (BIOFREEZE EX) Apply 1 application topically 3 (three) times daily as needed. For pain      .  nitroGLYCERIN (NITROSTAT) 0.4 MG SL tablet Place 0.4 mg under the tongue every 5 (five) minutes as needed. For chest pain      . pantoprazole (PROTONIX) 40 MG tablet TAKE ONE TABLET BY MOUTH TWICE DAILY  60 tablet  5  . potassium chloride (K-DUR,KLOR-CON) 10 MEQ tablet Take 1 tablet (10 mEq total) by mouth 2 (two) times daily.  60 tablet  5  . pravastatin (PRAVACHOL) 40 MG tablet Take 1 tablet (40 mg total) by mouth at bedtime.  30 tablet  5  . PRODIGY TWIST TOP LANCETS 28G MISC Use to check blood sugars four times a day. Dx code 250.02  120 each  5  . senna-docusate (SENOKOT-S) 8.6-50 MG per tablet Take 2 tablets by mouth 2 (two) times daily. For constipation.      Marland Kitchen ULTICARE SHORT PEN NEEDLES 31G X 8 MM MISC USE TO ADMINISTER INSULIN FIVE TIMES A DAY  150 each  5   No current facility-administered medications on file prior to visit.    Allergies:   Allergies  Allergen Reactions  . Penicillins Anaphylaxis  . Pneumococcal Vaccines Anaphylaxis  . Shellfish Allergy Anaphylaxis  . Lisinopril     cough  . Topiramate Other (See Comments)    Chest spasms and numbness   Filed Vitals:   03/27/13 1527  BP: 152/93  Pulse: 87  Temp: 98.7 F (37.1 C)    Physical Exam General: well developed, well nourished, young African American male seated, in no evident distress Head: head normocephalic and atraumatic. Orohparynx benign Neck: supple with no carotid or supraclavicular bruits Cardiovascular: regular rate and rhythm, no murmurs Musculoskeletal: no deformity Skin:  no rash/petichiae Vascular:  Normal pulses all extremities  Neurologic Exam Mental Status: Awake and fully alert. Oriented to place and time. Recent and remote memory intact. Attention span, concentration and fund of knowledge appropriate. Mood and affect appropriate.  Cranial Nerves: Fundoscopic exam reveals sharp disc margins. Pupils equal, briskly reactive to light. Extraocular movements full without nystagmus. Visual fields  full to confrontation. Hearing intact. Mild left lower facial asymmetry Facial sensation intact. Face, tongue, palate moves normally and symmetrically.  Motor: Normal bulk and tone. Normal strength on all tested extremity muscles on right. Spastic left hemiparesis with 4/5 strength on the left with weakness of the left grip and intrinsic hand muscles. Orbits right over left upper extremity. Left foot drop with ankle dorsiflexors weakness.. Sensory.: intact to tough and pinprick and vibratory.  Coordination: Rapid alternating movements normal in all extremities. Finger-to-nose and heel-to-shin impaired on the left Gait and Station: Arises from chair without difficulty.  Stance is normal. Gait demonstrates hemiplegia gait with circumduction of the left leg with increased tone. Left foot drop and wears a AFO . Able to heel, toe and tandem walk without difficulty.  Reflexes: 1+ and symmetric. Toes downgoing.     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 Vascular risk factors of hypertension, hyperlipidemia, diabetes , cerebrovascular disease and coronary artery disease    PLAN: Continue Plavix for secondary stroke prevention with strict control of diabetes with hemoglobin A1c goal below 6.5%, hypertension with blood pressure goal below 120/80 and lipids with LDL cholesterol goal below 70 mg percent. Continue home physical therapy and return for followup in 6 months with Jeani Hawking, NP

## 2013-03-28 ENCOUNTER — Ambulatory Visit: Payer: Medicare Other | Admitting: Physical Therapy

## 2013-03-28 ENCOUNTER — Ambulatory Visit: Payer: Medicare Other | Admitting: Occupational Therapy

## 2013-03-29 ENCOUNTER — Ambulatory Visit: Payer: Self-pay | Admitting: Neurology

## 2013-04-05 IMAGING — CT CT HEAD W/O CM
1 of 2 series · 13 of 30 positions shown, 17 images · non-contrast
Comparison: MRI of the brain 08/15/2012.

CLINICAL DATA: Left-sided numbness extending around lips and
throat.

CT HEAD WITHOUT CONTRAST
TECHNIQUE: Contiguous axial images were obtained from the base of
the skull through the vertex without contrast.

[Series 2: brain · axial · 0.49mm/px · z∈[+184,+314]mm · 13 of 32 slices shown, 17 images]
[im 3/32  brain]
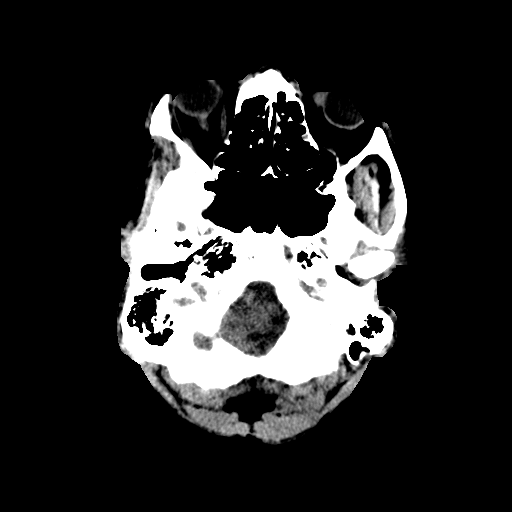
[im 3/32  bone]
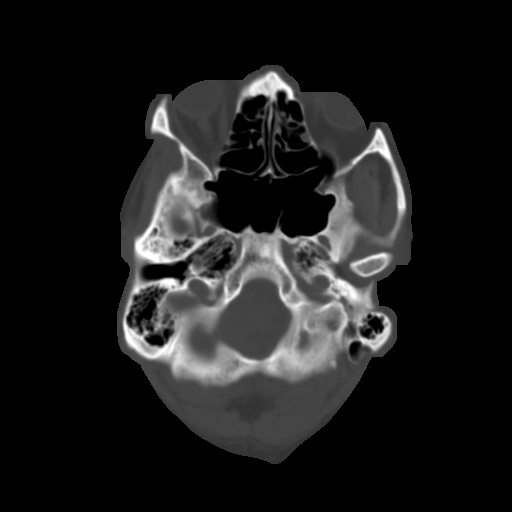
[im 5/32  brain]
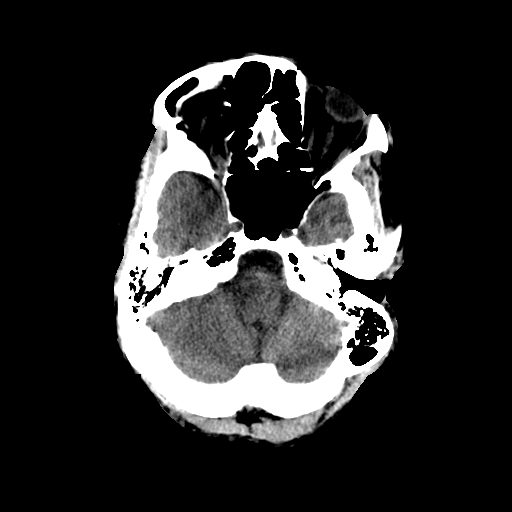
[im 7/32  brain]
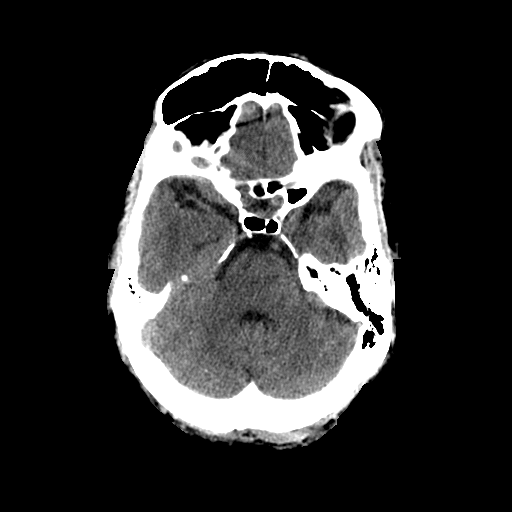
[im 9/32  brain]
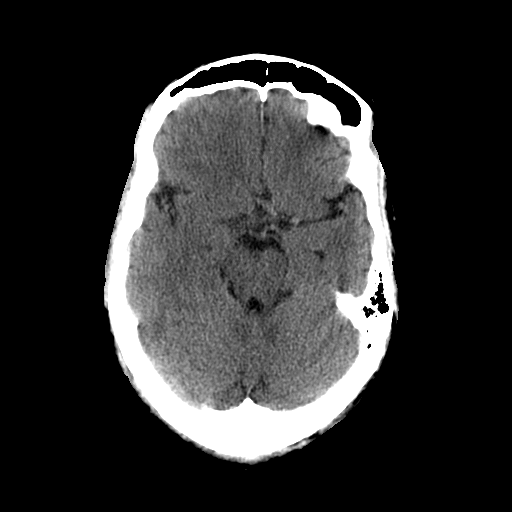
[im 12/32  brain]
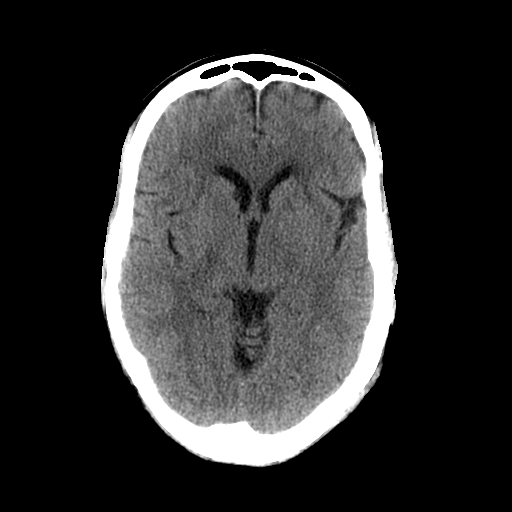
[im 12/32  bone]
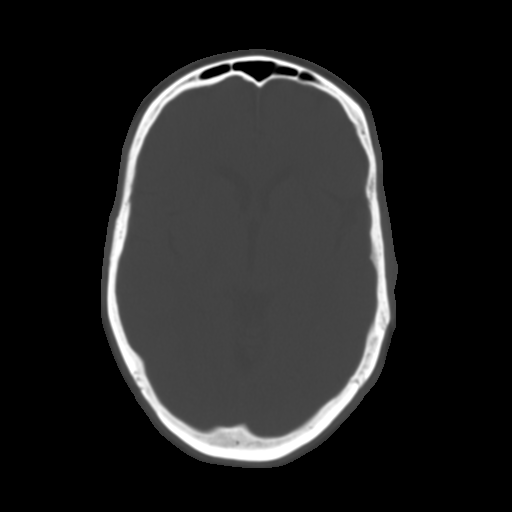
[im 14/32  brain]
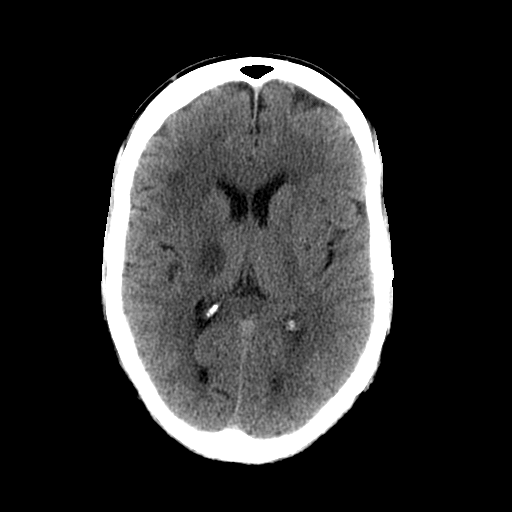
[im 16/32  brain]
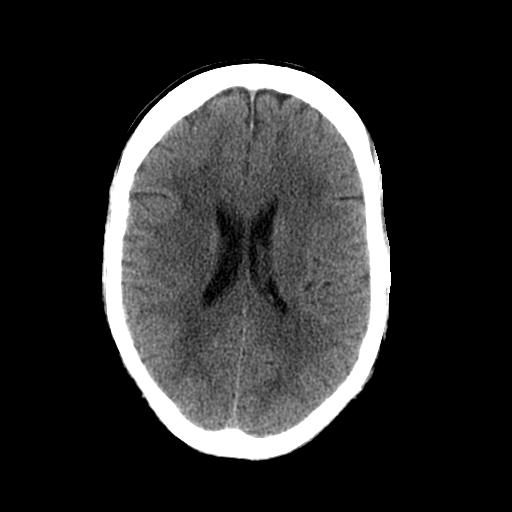
[im 18/32  brain]
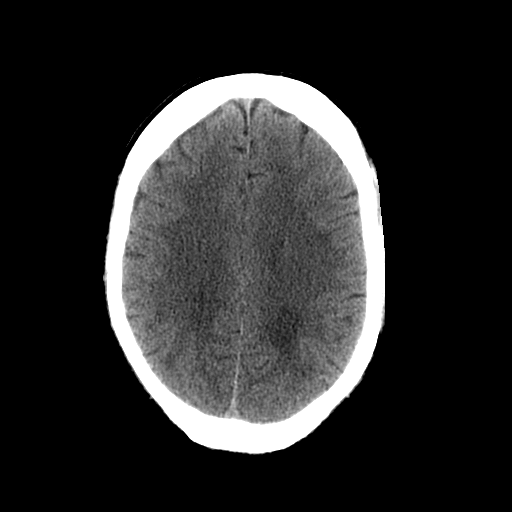
[im 20/32  brain]
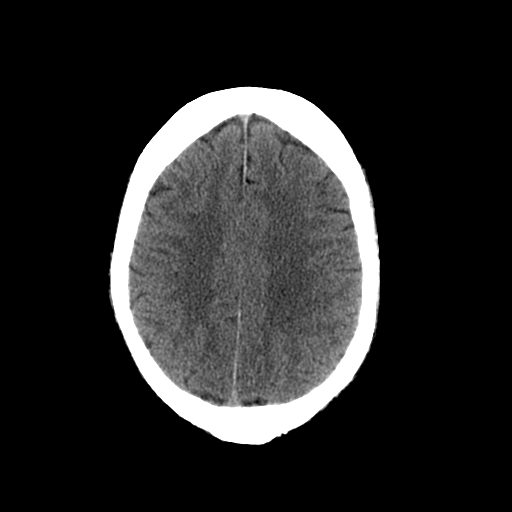
[im 20/32  bone]
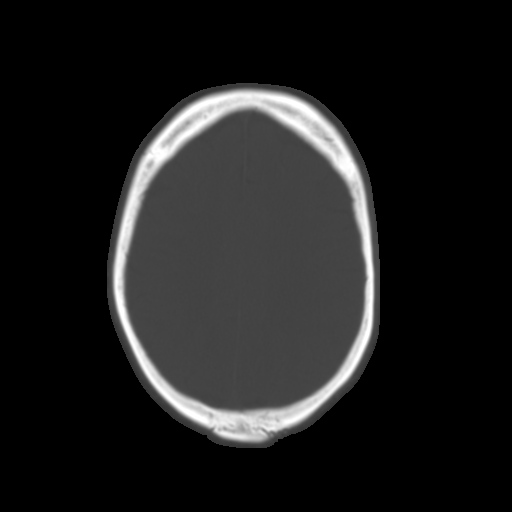
[im 23/32  brain]
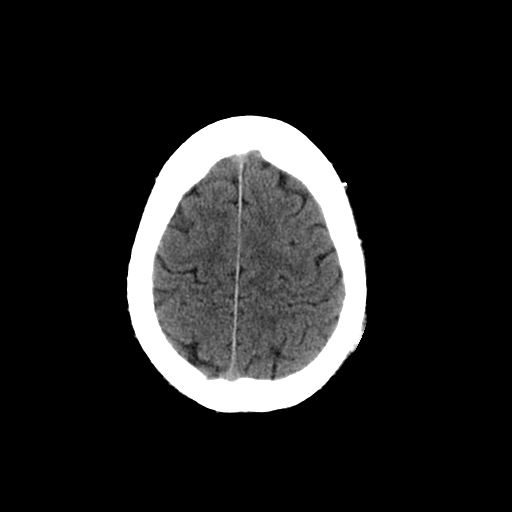
[im 25/32  brain]
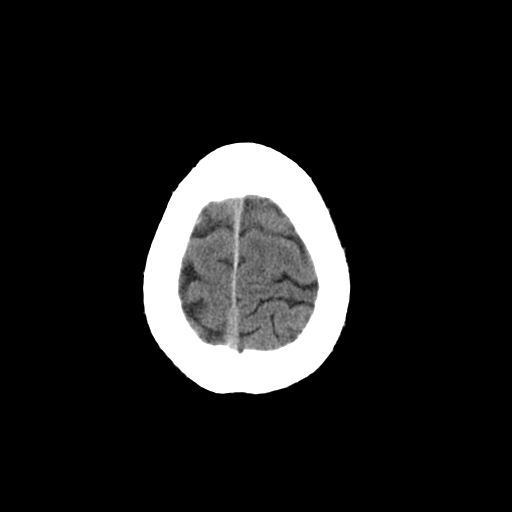
[im 27/32  brain]
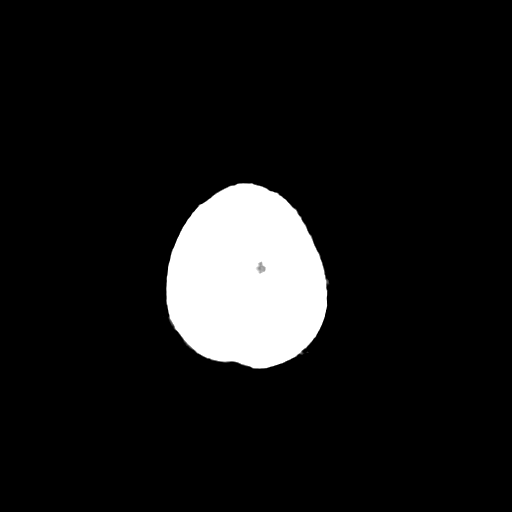
[im 29/32  brain]
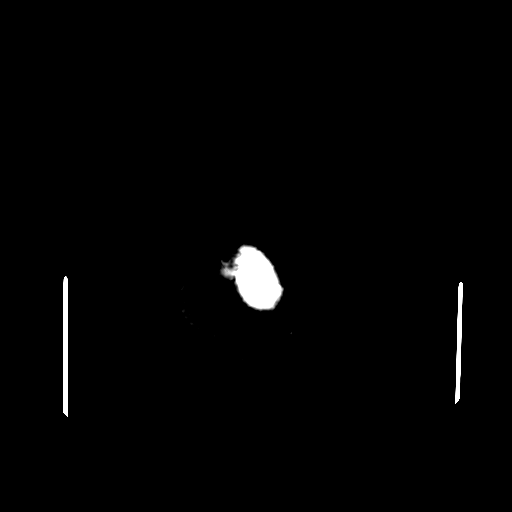
[im 29/32  bone]
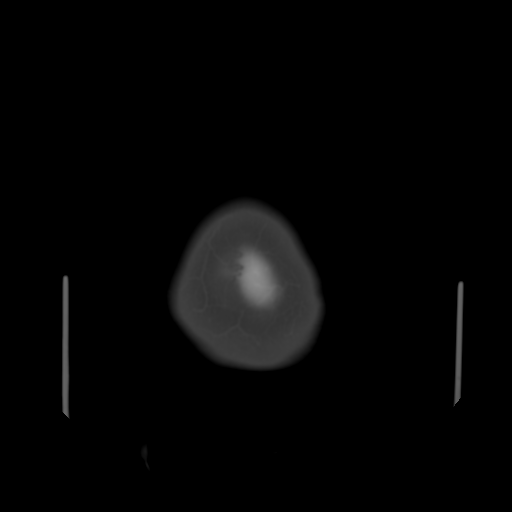

[13 of 30 positions shown; findings below may reference images not displayed]

FINDINGS: The subacute non hemorrhagic infarct of the posterior
limb right internal capsule and thalamus is again noted.  Remote
ischemic changes are noted adjacent to the frontal horn of the left
lateral ventricle.  Additional subcortical white matter changes are
stable bilaterally.

No acute cortical infarct, hemorrhage, mass lesion is present.  The
ventricles are of normal size.  No significant extra-axial fluid
collection is present.  A remote lacunar infarct of the right
caudate head is again noted.

The paranasal sinuses and mastoid air cells are clear.  The osseous
skull is intact.
IMPRESSION: 1.  Stable appearance of the subacute non hemorrhagic infarct in
the posterior limb right internal capsule and thalamus.
2.  Remote lacunar infarct of the right caudate head.
3.  Extensive subcortical white matter changes are stable.
4.  No acute intracranial abnormality.

## 2013-04-27 ENCOUNTER — Other Ambulatory Visit: Payer: Self-pay | Admitting: Internal Medicine

## 2013-04-30 ENCOUNTER — Other Ambulatory Visit: Payer: Self-pay | Admitting: *Deleted

## 2013-04-30 MED ORDER — INSULIN ASPART 100 UNIT/ML ~~LOC~~ SOLN: SUBCUTANEOUS | Status: DC

## 2013-04-30 MED ORDER — POTASSIUM CHLORIDE CRYS ER 10 MEQ PO TBCR: EXTENDED_RELEASE_TABLET | ORAL | Status: DC

## 2013-05-08 ENCOUNTER — Other Ambulatory Visit: Payer: Self-pay | Admitting: Internal Medicine

## 2013-05-14 ENCOUNTER — Other Ambulatory Visit: Payer: Self-pay | Admitting: *Deleted

## 2013-05-14 MED ORDER — HYDROCHLOROTHIAZIDE 12.5 MG PO CAPS: 12.5000 mg | ORAL_CAPSULE | ORAL | Status: DC

## 2013-05-23 ENCOUNTER — Other Ambulatory Visit: Payer: Self-pay | Admitting: Internal Medicine

## 2013-06-15 ENCOUNTER — Other Ambulatory Visit: Payer: Self-pay | Admitting: Internal Medicine

## 2013-06-18 ENCOUNTER — Other Ambulatory Visit: Payer: Self-pay

## 2013-06-18 MED ORDER — CLOPIDOGREL BISULFATE 75 MG PO TABS: 75.0000 mg | ORAL_TABLET | Freq: Every evening | ORAL | Status: DC

## 2013-06-21 ENCOUNTER — Other Ambulatory Visit: Payer: Self-pay | Admitting: Internal Medicine

## 2013-06-22 DIAGNOSIS — R490 Dysphonia: Secondary | ICD-10-CM | POA: Insufficient documentation

## 2013-07-09 ENCOUNTER — Ambulatory Visit (INDEPENDENT_AMBULATORY_CARE_PROVIDER_SITE_OTHER): Payer: Medicare Other | Admitting: Ophthalmology

## 2013-07-09 DIAGNOSIS — H43819 Vitreous degeneration, unspecified eye: Secondary | ICD-10-CM

## 2013-07-09 DIAGNOSIS — I10 Essential (primary) hypertension: Secondary | ICD-10-CM

## 2013-07-09 DIAGNOSIS — E11319 Type 2 diabetes mellitus with unspecified diabetic retinopathy without macular edema: Secondary | ICD-10-CM

## 2013-07-09 DIAGNOSIS — E1139 Type 2 diabetes mellitus with other diabetic ophthalmic complication: Secondary | ICD-10-CM

## 2013-07-09 DIAGNOSIS — H35039 Hypertensive retinopathy, unspecified eye: Secondary | ICD-10-CM

## 2013-07-09 DIAGNOSIS — H2510 Age-related nuclear cataract, unspecified eye: Secondary | ICD-10-CM

## 2013-07-15 ENCOUNTER — Other Ambulatory Visit: Payer: Self-pay | Admitting: Internal Medicine

## 2013-07-17 ENCOUNTER — Other Ambulatory Visit: Payer: Self-pay | Admitting: Internal Medicine

## 2013-07-20 ENCOUNTER — Other Ambulatory Visit: Payer: Self-pay | Admitting: Internal Medicine

## 2013-08-01 ENCOUNTER — Ambulatory Visit: Payer: Medicare Other | Admitting: Internal Medicine

## 2013-08-01 DIAGNOSIS — Z0289 Encounter for other administrative examinations: Secondary | ICD-10-CM

## 2013-08-09 ENCOUNTER — Encounter (INDEPENDENT_AMBULATORY_CARE_PROVIDER_SITE_OTHER): Payer: Medicare Other | Admitting: Ophthalmology

## 2013-08-09 DIAGNOSIS — E11319 Type 2 diabetes mellitus with unspecified diabetic retinopathy without macular edema: Secondary | ICD-10-CM

## 2013-08-09 DIAGNOSIS — H35039 Hypertensive retinopathy, unspecified eye: Secondary | ICD-10-CM

## 2013-08-09 DIAGNOSIS — I10 Essential (primary) hypertension: Secondary | ICD-10-CM

## 2013-08-09 DIAGNOSIS — E1139 Type 2 diabetes mellitus with other diabetic ophthalmic complication: Secondary | ICD-10-CM

## 2013-09-07 ENCOUNTER — Other Ambulatory Visit: Payer: Self-pay | Admitting: *Deleted

## 2013-09-07 MED ORDER — GLUCOSE BLOOD VI STRP: ORAL_STRIP | Status: DC

## 2013-09-07 NOTE — Telephone Encounter (Signed)
Faxed script bck to [facility]...lmb

## 2013-09-27 ENCOUNTER — Ambulatory Visit (INDEPENDENT_AMBULATORY_CARE_PROVIDER_SITE_OTHER): Payer: Medicare Other | Admitting: Nurse Practitioner

## 2013-09-27 ENCOUNTER — Encounter: Payer: Self-pay | Admitting: Nurse Practitioner

## 2013-09-27 ENCOUNTER — Other Ambulatory Visit: Payer: Self-pay

## 2013-09-27 VITALS — BP 139/82 | HR 98 | Temp 98.0°F | Ht 71.0 in | Wt 217.3 lb

## 2013-09-27 DIAGNOSIS — G8110 Spastic hemiplegia affecting unspecified side: Secondary | ICD-10-CM

## 2013-09-27 NOTE — Patient Instructions (Signed)
PLAN:  Continue Plavix for secondary stroke prevention with strict control of diabetes with hemoglobin A1c goal below 6.5%, hypertension with blood pressure goal below 130/80 and lipids with LDL cholesterol goal below 70 mg percent.  Return for followup in 6 months with Charlott Holler, NP

## 2013-09-27 NOTE — Progress Notes (Signed)
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 Vascular risk factors of hypertension, hyperlipidemia, diabetes , cerebrovascular disease and coronary artery disease  He returns for followup of her last visit on 12/26/12. He states he continues to show improvement and has finished physical therapy last week. He plans to join [employer] for [employer]. He is walking with a cane and a left foot AFO. He also sees a reflexologist for improving his feelings and balance. He states his sugars are not well controlled with fasting glucoses running in the 150-160 range. His blood pressure usually runs in the 130s at home though it is elevated at 152/93 in office today. His last hemoglobin A1c was 7.5 in February 2014. He has discontinued the baclofen which in fact was making his leg spasms worse as well as Lyrica which did not help assist his pain. He does not want to try any new medications for the same at the present time.   UPDATE 09/27/13 (LL):Patient returns to office for stroke revisit.  He states he is doing some better but feels he has a long way to go.  He states his BP is well controlled, BP in office today is 139/82.  He states he is still having a lot of pain in his left lower back, left hamstring, left biceps area and left ankle.  He is going to see Dr. Wynona Canes at [facility] to see if there are any therapies or trials there that may help him.  He also sees a Electrical engineer there, Wonda Amis, who has been working with him on his speech.  He has not had his lipids or other labs checked lately but has already eaten today so I asked him to please see his PCP to have  lipids and A1c checked.  He was started on Nortriptyline at night which he states has helped the twitching in his legs.  He is tolerating Plavix daily without known side effects. HAM-A score 12, very mild anxiety.  ROS:  14 system review of systems is positive for feeling cold, joint pain and swelling, cramps, aching muscle, trouble swallowing, itching, importance, runny nose, memory loss, numbness and weakness   ALLERGIES: Allergies  Allergen Reactions  . Penicillins Anaphylaxis  . Pneumococcal Vaccines Anaphylaxis  . Shellfish Allergy Anaphylaxis  . Lisinopril     cough  . Topiramate Other (See Comments)    Chest spasms and numbness    HOME MEDICATIONS: Outpatient Prescriptions Prior to Visit  Medication Sig Dispense Refill  . amLODipine (NORVASC) 5 MG tablet Take 5 mg by mouth daily.   30 tablet  11  . atenolol (TENORMIN) 25 MG tablet Take 1 tablet (25 mg total) by mouth daily.  30 tablet  5  . cetirizine (ZYRTEC) 10 MG tablet Take 1 tablet (10 mg total) by mouth daily.  30 tablet  11  . clopidogrel (PLAVIX) 75 MG tablet Take 1 tablet (75 mg total) by mouth every evening.  30 tablet  3  . feeding supplement (GLUCERNA SHAKE) LIQD Take 237 mLs by mouth 3 (three) times daily between meals.  30 Can  0  . glucose blood (PRODIGY NO CODING BLOOD GLUC) test strip Use one strip to test blood sugar four times a  day Dx 250.62  100 each  11  . hydrochlorothiazide (MICROZIDE) 12.5 MG capsule Take 1 capsule (12.5 mg total) by mouth every morning.  30 capsule  5  . KLOR-CON M10 10 MEQ tablet TAKE ONE TABLET BY MOUTH TWICE DAILY  60 tablet  5  . losartan (COZAAR) 25 MG tablet Take 1 tablet (25 mg total) by mouth daily.  90 tablet  3  . Menthol, Topical Analgesic, (BIOFREEZE EX) Apply 1 application topically 3 (three) times daily as needed. For pain      . nitroGLYCERIN (NITROSTAT) 0.4 MG SL tablet Place 0.4 mg under the tongue every 5 (five) minutes as needed. For chest pain      . NOVOLOG FLEXPEN  100 UNIT/ML SOPN FlexPen INJECT 10 UNITS INTO SKIN 3 TIMES DAILY BEFORE MEALS  3 pen  2  . pantoprazole (PROTONIX) 40 MG tablet TAKE ONE TABLET BY MOUTH TWICE DAILY  60 tablet  5  . pravastatin (PRAVACHOL) 40 MG tablet TAKE ONE TABLET BY MOUTH AT BEDTIME  30 tablet  5  . PRODIGY TWIST TOP LANCETS 28G MISC USE ONE LANCET TO CHECK GLUCOSE 4 TIMES DAILY  120 each  5  . senna-docusate (SENOKOT-S) 8.6-50 MG per tablet Take 2 tablets by mouth 2 (two) times daily. For constipation.      Marland Kitchen ULTICARE SHORT PEN NEEDLES 31G X 8 MM MISC USE TO ADMINISTER INSULIN FIVE TIMES A DAY  150 each  5  . KLOR-CON M10 10 MEQ tablet TAKE ONE TABLET BY MOUTH TWICE DAILY  60 tablet  0   No facility-administered medications prior to visit.    PAST MEDICAL HISTORY: Past Medical History  Diagnosis Date  . ALLERGIC RHINITIS   . CAD, NATIVE VESSEL     BMS to OM1 2001, DES to BMS 2005  . DIAB W/UNSPEC COMP TYPE II/UNSPEC TYPE UNCNTRL   . ERECTILE DYSFUNCTION   . HYPERLIPIDEMIA-MIXED   . MORTON'S NEUROMA, RIGHT   . SHOULDER PAIN, RIGHT   . HYPERTENSION, BENIGN   . Peripheral neuropathy   . Chronic back pain     herniated disc  . GERD   . Constipation     takes Carafate four times day  . Seasonal allergies     takes Allegra and Benadryl daily prn;uses Flonase daily  . TIA on medication 02/2012  . Stroke, thrombotic 07/2012    L HP + hemiparesis, s/p CIR     PAST SURGICAL HISTORY: Past Surgical History  Procedure Laterality Date  . Stent  2001, 2004    coronary stents  . Angioplasty    . Left knee surgury      x 2   . Dental surgery    . Coronary angioplasty  2005    2 stents  . Colonoscopy    . Laryngoplasty  08/07/2012    Procedure: LARYNGOPLASTY;  Surgeon: Izora Gala, MD;  Location: [facility];  Service: ENT;  Laterality: Left;  Left Vocal Cord Medialyzation    FAMILY HISTORY: Family History  Problem Relation Age of Onset  . Stroke      SOCIAL HISTORY: History   Social History  . Marital  Status: Legally Separated    Spouse Name: N/A    Number of Children: 3  . Years of Education: college   Occupational History  . disabled    Social History Main Topics  . Smoking status: Former Smoker    Quit date: 02/24/1983  . Smokeless tobacco: Never Used  Comment: Married, lives in [place] with wife. He is disabled secondary to back pain. 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 Alcohol Use: No  . Drug Use: No  . Sexual Activity: No   Other Topics Concern  . Not on file   Social History Narrative  . No narrative on file     PHYSICAL EXAM  Filed Vitals:   09/27/13 1111  Weight: 217 lb 4.8 oz (98.567 kg)   Body mass index is 30.32 kg/(m^2).  Physical Exam  General: well developed, well nourished, young African American male seated, in no evident distress  Head: head normocephalic and atraumatic. Orohparynx benign, voice is soft, raspy Neck: supple with no carotid or supraclavicular bruits  Cardiovascular: regular rate and rhythm, no murmurs  Musculoskeletal: no deformity  Skin: no rash/petichiae  Vascular: Normal pulses all extremities   Neurologic Exam  Mental Status: Awake and fully alert. Oriented to place and time. Recent and remote memory intact. Attention span, concentration and fund of knowledge appropriate. Mood and affect appropriate.  Cranial Nerves:  Pupils equal, briskly reactive to light. Extraocular movements full without nystagmus. Visual fields full to confrontation. Hearing intact. Mild left lower facial asymmetry Facial sensation intact. Face, tongue, palate moves normally and symmetrically.  Motor: Normal bulk and tone. Normal strength on all tested extremity muscles on right. Spastic left hemiparesis with 4/5 strength on the left with weakness of the left grip and intrinsic hand muscles. Orbits right over left upper extremity. Left foot drop with ankle dorsiflexors weakness..  Sensory.: intact to tough and pinprick and vibratory.  Coordination:  Rapid alternating movements normal in all extremities. Finger-to-nose and heel-to-shin impaired on the left  Gait and Station: Arises from chair wit mild difficulty. Stance is normal. Gait demonstrates hemiplegia gait with circumduction of the left leg with increased tone. unable to heel, toe and tandem walk without difficulty.  Reflexes: 1+ and symmetric. Toes downgoing.   DIAGNOSTIC DATA (LABS, IMAGING, TESTING) - I reviewed patient records, labs, notes, testing and imaging myself where available.  Lab Results  Component Value Date   WBC 6.1 10/10/2012   HGB 12.4* 10/10/2012   HCT 35.2* 10/10/2012   MCV 85.4 10/10/2012   PLT 200 10/10/2012      Component Value Date/Time   NA 138 10/10/2012 2147   K 3.3* 10/10/2012 2147   CL 103 10/10/2012 2147   CO2 26 10/10/2012 2147   GLUCOSE 80 10/10/2012 2147   BUN 19 10/10/2012 2147   CREATININE 1.23 10/10/2012 2147   CALCIUM 9.4 10/10/2012 2147   PROT 7.3 10/10/2012 2147   ALBUMIN 3.7 10/10/2012 2147   AST 25 10/10/2012 2147   ALT 30 10/10/2012 2147   ALKPHOS 57 10/10/2012 2147   BILITOT 0.6 10/10/2012 2147   GFRNONAA 61* 10/10/2012 2147   GFRAA 71* 10/10/2012 2147   Lab Results  Component Value Date   CHOL 90 08/15/2012   HDL 52 08/15/2012   LDLCALC 26 08/15/2012   LDLDIRECT 112.2 02/25/2010   TRIG 59 08/15/2012   CHOLHDL 1.7 08/15/2012   Lab Results  Component Value Date   HGBA1C 7.5* 01/30/2013   No results found for this basename: VITAMINB12   No results found for this basename: TSH    ASSESSMENT AND PLAN 62 year old African American male with 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 Vascular risk factors of hypertension, hyperlipidemia, diabetes, cerebrovascular disease and coronary artery disease.  He has resultant left spastic hemiplegia.   PLAN:  Continue Plavix for secondary stroke prevention with strict control of  diabetes with hemoglobin A1c goal below 6.5%, hypertension with blood pressure goal below 130/80 and lipids with LDL cholesterol goal below 70 mg percent.  Return for followup in 6 months with Mike Hawking, [name]   Mike Rummage Neiman Roots, MSN, [name]-C 09/27/2013, 11:11 AM [facility] [address] [place], [place] [address] [phone]  Note: This document was prepared with digital dictation and possible smart phrase technology. Any transcriptional errors that result from this process are unintentional.

## 2013-10-16 ENCOUNTER — Other Ambulatory Visit: Payer: Self-pay | Admitting: Neurology

## 2013-10-17 ENCOUNTER — Encounter: Payer: Self-pay | Admitting: Internal Medicine

## 2013-10-17 ENCOUNTER — Ambulatory Visit (INDEPENDENT_AMBULATORY_CARE_PROVIDER_SITE_OTHER): Payer: Medicare Other | Admitting: Internal Medicine

## 2013-10-17 ENCOUNTER — Other Ambulatory Visit (INDEPENDENT_AMBULATORY_CARE_PROVIDER_SITE_OTHER): Payer: Medicare Other

## 2013-10-17 VITALS — BP 142/90 | HR 99 | Temp 98.3°F | Wt 217.0 lb

## 2013-10-17 DIAGNOSIS — E1140 Type 2 diabetes mellitus with diabetic neuropathy, unspecified: Secondary | ICD-10-CM

## 2013-10-17 DIAGNOSIS — E785 Hyperlipidemia, unspecified: Secondary | ICD-10-CM

## 2013-10-17 DIAGNOSIS — R05 Cough: Secondary | ICD-10-CM

## 2013-10-17 DIAGNOSIS — I10 Essential (primary) hypertension: Secondary | ICD-10-CM

## 2013-10-17 DIAGNOSIS — E1149 Type 2 diabetes mellitus with other diabetic neurological complication: Secondary | ICD-10-CM

## 2013-10-17 DIAGNOSIS — E1142 Type 2 diabetes mellitus with diabetic polyneuropathy: Secondary | ICD-10-CM

## 2013-10-17 DIAGNOSIS — R059 Cough, unspecified: Secondary | ICD-10-CM

## 2013-10-17 LAB — BASIC METABOLIC PANEL
BUN: 21 mg/dL (ref 6–23)
CO2: 33 mEq/L — ABNORMAL HIGH (ref 19–32)
Calcium: 9.9 mg/dL (ref 8.4–10.5)
Chloride: 99 mEq/L (ref 96–112)
Creatinine, Ser: 1.4 mg/dL (ref 0.4–1.5)
GFR: 67.29 mL/min (ref >60.00)
Glucose, Bld: 183 mg/dL — ABNORMAL HIGH (ref 70–99)
Potassium: 4 mEq/L (ref 3.5–5.1)
Sodium: 137 mEq/L (ref 135–145)

## 2013-10-17 LAB — LIPID PANEL
Cholesterol: 149 mg/dL (ref 0–200)
HDL: 59.4 mg/dL (ref >39.00)
LDL Cholesterol: 64 mg/dL (ref 0–99)
Total CHOL/HDL Ratio: 3
Triglycerides: 128 mg/dL (ref 0.0–149.0)
VLDL: 25.6 mg/dL (ref 0.0–40.0)

## 2013-10-17 LAB — HEPATIC FUNCTION PANEL
ALT: 42 U/L (ref 0–53)
AST: 30 U/L (ref 0–37)
Albumin: 4.5 g/dL (ref 3.5–5.2)
Alkaline Phosphatase: 65 U/L (ref 39–117)
Bilirubin, Direct: 0.2 mg/dL (ref 0.0–0.3)
Total Bilirubin: 1.5 mg/dL — ABNORMAL HIGH (ref 0.3–1.2)
Total Protein: 8.6 g/dL — ABNORMAL HIGH (ref 6.0–8.3)

## 2013-10-17 LAB — HEMOGLOBIN A1C: Hgb A1c MFr Bld: 7.4 % — ABNORMAL HIGH (ref 4.6–6.5)

## 2013-10-17 MED ORDER — LEVOFLOXACIN 500 MG PO TABS: 500.0000 mg | ORAL_TABLET | Freq: Every day | ORAL | Status: DC

## 2013-10-17 NOTE — Assessment & Plan Note (Signed)
stable overall by history and exam, recent data reviewed with pt, and pt to continue medical treatment as before,  to f/u any worsening symptoms or concerns Lab Results  Component Value Date   LDLCALC 26 08/15/2012

## 2013-10-17 NOTE — Assessment & Plan Note (Signed)
No wheeze or rales, declines cxr, etiology unclear but will change cipro to levaquin asd,  to f/u any worsening symptoms or concerns

## 2013-10-17 NOTE — Assessment & Plan Note (Signed)
?   Control, for labs today, o/w stable overall by history and exam, recent data reviewed with pt, and pt to continue medical treatment as before,  to f/u any worsening symptoms or concerns Lab Results  Component Value Date   HGBA1C 7.5* 01/30/2013

## 2013-10-17 NOTE — Patient Instructions (Signed)
Ok to stop the cipro Please take all new medication as prescribed - the generic levaquin 500 mg per day (works for sinus and chest) Please continue all other medications as before, and refills have been done if requested. Please have the pharmacy call with any other refills you may need.  Please go to the LAB in the Basement (turn left off the elevator) for the tests to be done today  You will be contacted by phone if any changes need to be made immediately.  Otherwise, you will receive a letter about your results with an explanation, but please check with MyChart first.  You are given the information /discharge summaries from late 2013 admissions per your request  Please see Dr Asa Lente in 3-6 months

## 2013-10-17 NOTE — Progress Notes (Signed)
Subjective:    Patient ID: [name], male    DOB: 04-Dec-1950, 62 y.o.   MRN: [id number]  HPI Here to f/u as Dr Asa Lente not in the office today.  Is currently on cipro with some improving sinus pain, colored congestion and fever, but now with worsening chest symptoms  - Here with acute onset mild to mod 2-3 days ST, HA, general weakness and malaise, with prod cough greenish sputum, but Pt denies chest pain, increased sob or doe, wheezing, orthopnea, PND, increased LE swelling, palpitations, dizziness or syncope.   Pt denies polydipsia, polyuria, or low sugar symptoms such as weakness or confusion improved with po intake.  Pt states overall good compliance with meds, trying to follow lower cholesterol, diabetic diet, wt overall stable, asks for labs today Past Medical History  Diagnosis Date  . ALLERGIC RHINITIS   . CAD, NATIVE VESSEL     BMS to OM1 2001, DES to BMS 2005  . DIAB W/UNSPEC COMP TYPE II/UNSPEC TYPE UNCNTRL   . ERECTILE DYSFUNCTION   . HYPERLIPIDEMIA-MIXED   . MORTON'S NEUROMA, RIGHT   . SHOULDER PAIN, RIGHT   . HYPERTENSION, BENIGN   . Peripheral neuropathy   . Chronic back pain     herniated disc  . GERD   . Constipation     takes Carafate four times day  . Seasonal allergies     takes Allegra and Benadryl daily prn;uses Flonase daily  . TIA on medication 02/2012  . 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 Stent  2001, 2004    coronary stents  . Angioplasty    . Left knee surgury      x 2   . Dental surgery    . Coronary angioplasty  2005    2 stents  . Colonoscopy    . Laryngoplasty  08/07/2012    Procedure: LARYNGOPLASTY;  Surgeon: Izora Gala, MD;  Location: [facility];  Service: ENT;  Laterality: Left;  Left Vocal Cord Medialyzation    reports that he quit smoking about 30 years ago. He has never used smokeless tobacco. He reports that he does not drink alcohol or use illicit  drugs. family history includes Stroke in an other family member. Allergies  Allergen Reactions  . Penicillins Anaphylaxis  . Pneumococcal Vaccines Anaphylaxis  . Shellfish Allergy Anaphylaxis  . Lisinopril     cough  . Topiramate Other (See Comments)    Chest spasms and numbness   Current Outpatient Prescriptions on File Prior to Visit  Medication Sig Dispense Refill  . amLODipine (NORVASC) 5 MG tablet Take 5 mg by mouth daily.   30 tablet  11  . atenolol (TENORMIN) 25 MG tablet Take 1 tablet (25 mg total) by mouth daily.  30 tablet  5  . cetirizine (ZYRTEC) 10 MG tablet Take 1 tablet (10 mg total) by mouth daily.  30 tablet  11  . clopidogrel (PLAVIX) 75 MG tablet TAKE ONE TABLET BY MOUTH IN THE EVENING  90 tablet  1  . feeding supplement (GLUCERNA SHAKE) LIQD Take 237 mLs by mouth 3 (three) times daily between meals.  30 Can  0  . glucose blood (PRODIGY NO CODING BLOOD GLUC) test strip Use one strip to test blood sugar four times a day Dx 250.62  100 each  11  . hydrochlorothiazide (MICROZIDE) 12.5 MG capsule Take 1 capsule (12.5 mg total) by  mouth every morning.  30 capsule  5  . KLOR-CON M10 10 MEQ tablet TAKE ONE TABLET BY MOUTH TWICE DAILY  60 tablet  5  . losartan (COZAAR) 25 MG tablet Take 1 tablet (25 mg total) by mouth daily.  90 tablet  3  . Menthol, Topical Analgesic, (BIOFREEZE EX) Apply 1 application topically 3 (three) times daily as needed. For pain      . nitroGLYCERIN (NITROSTAT) 0.4 MG SL tablet Place 0.4 mg under the tongue every 5 (five) minutes as needed. For chest pain      . NORTRIPTYLINE HCL PO Take 500 mg by mouth at bedtime.      Marland Kitchen NOVOLOG FLEXPEN 100 UNIT/ML SOPN FlexPen INJECT 10 UNITS INTO SKIN 3 TIMES DAILY BEFORE MEALS  3 pen  2  . pantoprazole (PROTONIX) 40 MG tablet TAKE ONE TABLET BY MOUTH TWICE DAILY  60 tablet  5  . pravastatin (PRAVACHOL) 40 MG tablet TAKE ONE TABLET BY MOUTH AT BEDTIME  30 tablet  5  . PRODIGY TWIST TOP LANCETS 28G MISC USE ONE  LANCET TO CHECK GLUCOSE 4 TIMES DAILY  120 each  5  . senna-docusate (SENOKOT-S) 8.6-50 MG per tablet Take 2 tablets by mouth 2 (two) times daily. For constipation.      Marland Kitchen ULTICARE SHORT PEN NEEDLES 31G X 8 MM MISC USE TO ADMINISTER INSULIN FIVE TIMES A DAY  150 each  5   No current facility-administered medications on file prior to visit.   Review of Systems  Constitutional: Negative for unexpected weight change, or unusual diaphoresis  HENT: Negative for tinnitus.   Eyes: Negative for photophobia and visual disturbance.  Respiratory: Negative for choking and stridor.   Gastrointestinal: Negative for vomiting and blood in stool.  Genitourinary: Negative for hematuria and decreased urine volume.  Musculoskeletal: Negative for acute joint swelling Skin: Negative for color change and wound.  Neurological: Negative for tremors and numbness other than noted  Psychiatric/Behavioral: Negative for decreased concentration or  hyperactivity.       Objective:   Physical Exam BP 142/90  Pulse 99  Temp(Src) 98.3 F (36.8 C) (Oral)  Wt 217 lb (98.431 kg)  SpO2 97% VS noted,  Constitutional: Pt appears well-developed and well-nourished.  HENT: Head: NCAT.  Right Ear: External ear normal.  Left Ear: External ear normal.  Bilat tm's with mild erythema.  Max sinus areas mild tender.  Pharynx with mild erythema, no exudate Eyes: Conjunctivae and EOM are normal. Pupils are equal, round, and reactive to light.  Neck: Normal range of motion. Neck supple.  Cardiovascular: Normal rate and regular rhythm.   Pulmonary/Chest: Effort normal and breath sounds somewhat decreased but no rales or wheezing.  Neurological: Pt is alert. Not confused  Skin: Skin is warm. No erythema.  Psychiatric: Pt behavior is normal. Thought content normal.       Assessment & Plan:

## 2013-10-17 NOTE — Assessment & Plan Note (Signed)
stable overall by history and exam, recent data reviewed with pt, and pt to continue medical treatment as before,  to f/u any worsening symptoms or concerns BP Readings from Last 3 Encounters:  10/17/13 142/90  09/27/13 139/82  03/27/13 152/93

## 2013-10-17 NOTE — Progress Notes (Signed)
Pre-visit discussion using our clinic review tool. No additional management support is needed unless otherwise documented below in the visit note.  

## 2013-11-02 ENCOUNTER — Other Ambulatory Visit: Payer: Self-pay | Admitting: Internal Medicine

## 2013-11-14 ENCOUNTER — Other Ambulatory Visit: Payer: Self-pay | Admitting: Internal Medicine

## 2013-12-22 ENCOUNTER — Other Ambulatory Visit: Payer: Self-pay | Admitting: Internal Medicine

## 2014-01-05 ENCOUNTER — Other Ambulatory Visit: Payer: Self-pay | Admitting: Internal Medicine

## 2014-02-07 ENCOUNTER — Other Ambulatory Visit: Payer: Self-pay | Admitting: Internal Medicine

## 2014-02-20 LAB — HM DIABETES EYE EXAM

## 2014-03-16 ENCOUNTER — Encounter: Payer: Self-pay | Admitting: Family Medicine

## 2014-03-16 ENCOUNTER — Ambulatory Visit (INDEPENDENT_AMBULATORY_CARE_PROVIDER_SITE_OTHER): Payer: Medicare Other | Admitting: Family Medicine

## 2014-03-16 VITALS — BP 182/120 | HR 96 | Temp 97.9°F | Wt 221.0 lb

## 2014-03-16 DIAGNOSIS — J209 Acute bronchitis, unspecified: Secondary | ICD-10-CM

## 2014-03-16 MED ORDER — LEVOFLOXACIN 500 MG PO TABS: 500.0000 mg | ORAL_TABLET | Freq: Every day | ORAL | Status: DC

## 2014-03-16 NOTE — Progress Notes (Signed)
   Subjective:    Patient ID: [name], male    DOB: 06-Jan-1951, 63 y.o.   MRN: [id number]  HPI Here for 2 weeks of low grade fevers, chest tightness and coughing up green sputum.   Review of Systems  Constitutional: Positive for fever.  HENT: Positive for congestion.   Eyes: Negative.   Respiratory: Positive for cough and chest tightness.        Objective:   Physical Exam  Constitutional: He appears well-developed and well-nourished.  HENT:  Right Ear: External ear normal.  Left Ear: External ear normal.  Nose: Nose normal.  Mouth/Throat: Oropharynx is clear and moist.  Eyes: Conjunctivae are normal.  Pulmonary/Chest: He has no rales.  Scattered wheezes and rhonchi   Lymphadenopathy:    He has no cervical adenopathy.          Assessment & Plan:  Add Mucinex

## 2014-03-27 ENCOUNTER — Ambulatory Visit (INDEPENDENT_AMBULATORY_CARE_PROVIDER_SITE_OTHER): Payer: Medicare Other | Admitting: Nurse Practitioner

## 2014-03-27 ENCOUNTER — Encounter: Payer: Self-pay | Admitting: Nurse Practitioner

## 2014-03-27 VITALS — BP 160/88 | HR 88 | Ht 71.0 in | Wt 223.0 lb

## 2014-03-27 DIAGNOSIS — G8110 Spastic hemiplegia affecting unspecified side: Secondary | ICD-10-CM

## 2014-03-27 NOTE — Patient Instructions (Addendum)
Continue Plavix for secondary stroke prevention with strict control of diabetes with hemoglobin A1c goal below 6.5%, hypertension with blood pressure goal below 130/80 and lipids with LDL cholesterol goal below 70 mg percent.   Continue Physical Therapy at the [facility]. Check carotid dopplers next visit. Return for followup in 6 months, sooner as needed.

## 2014-03-27 NOTE — Progress Notes (Signed)
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 Vascular risk factors of hypertension, hyperlipidemia, diabetes , cerebrovascular disease and coronary artery disease   03/27/13 (PS):  He returns for followup of her last visit on 12/26/12. He states he continues to show improvement and has finished physical therapy last week. He plans to join [employer] for [employer]. He is walking with a cane and a left foot AFO. He also sees a reflexologist for improving his feelings and balance. He states his sugars are not well controlled with fasting glucoses running in the 150-160 range. His blood pressure usually runs in the 130s at home though it is elevated at 152/93 in office today. His last hemoglobin A1c was 7.5 in February 2014. He has discontinued the baclofen which in fact was making his leg spasms worse as well as Lyrica which did not help assist his pain. He does not want to try any new medications for the same at the present time.   UPDATE 09/27/13 (LL):Patient returns to office for stroke revisit. He states he is doing some better but feels he has a long way to go. He states his BP is well controlled, BP in office today is 139/82. He states he is still having a lot of pain in his left lower back, left hamstring, left biceps area and left ankle. He is going to see Dr. Wynona Canes at [facility] to see if there are any therapies or trials there that may help him. He also sees a Electrical engineer there, Wonda Amis, who has been working with him on his speech. He has not had his lipids or other labs checked lately but has already eaten today so I asked him to please see his PCP to have lipids and A1c checked. He  is taking Nortriptyline at night which he states has helped the twitching in his legs. He is tolerating Plavix daily without known side effects. HAM-A score 12, very mild anxiety.   UPDATE 03/27/14 (LL): Since last visit, patient has been attending PT at the [facility] in [place] which he finds beneficial.  He still has significant spasticity on left side, but did not tolerate Baclofen in the past.  He does not want to try another medication currently. He states his BP is well controlled, though it is elevated in office today is 160/88.  His blood sugars range from 90's to 180's, last Hgb a1c was 7.4.  He is tolerating Plavix well with no signs of significant bleeding or bruising.  ROS:  14 system review of systems is positive for feeling cold, joint pain and swelling, cramps, aching muscle, trouble swallowing, itching, importance, runny nose, memory loss, numbness and weakness   ALLERGIES: Allergies  Allergen Reactions  . Penicillins Anaphylaxis  . Pneumococcal Vaccines Anaphylaxis  . Shellfish Allergy Anaphylaxis  . Lisinopril     cough  . Topiramate Other (See Comments)    Chest spasms and numbness    HOME MEDICATIONS: Outpatient Prescriptions Prior to Visit  Medication Sig Dispense Refill  . amLODipine (NORVASC) 5 MG tablet Take 5 mg by mouth daily.   30 tablet  11  . atenolol (TENORMIN) 25 MG tablet Take 1 tablet (25 mg total) by mouth daily.  30 tablet  5  . cetirizine (  ZYRTEC) 10 MG tablet Take 1 tablet (10 mg total) by mouth daily.  30 tablet  11  . clopidogrel (PLAVIX) 75 MG tablet TAKE ONE TABLET BY MOUTH IN THE EVENING  90 tablet  1  . glucose blood (PRODIGY NO CODING BLOOD GLUC) test strip Use one strip to test blood sugar four times a day Dx 250.62  100 each  11  . hydrochlorothiazide (MICROZIDE) 12.5 MG capsule TAKE ONE CAPSULE BY MOUTH IN THE MORNING  90 capsule  1  . KLOR-CON M10 10 MEQ tablet TAKE ONE TABLET BY MOUTH TWICE DAILY  60 tablet  5  . levofloxacin (LEVAQUIN) 500 MG  tablet Take 1 tablet (500 mg total) by mouth daily.  10 tablet  0  . losartan (COZAAR) 25 MG tablet TAKE ONE TABLET BY MOUTH ONCE DAILY  90 tablet  1  . nitroGLYCERIN (NITROSTAT) 0.4 MG SL tablet Place 0.4 mg under the tongue every 5 (five) minutes as needed. For chest pain      . NORTRIPTYLINE HCL PO Take 500 mg by mouth at bedtime.      Marland Kitchen NOVOLOG FLEXPEN 100 UNIT/ML FlexPen INJECT 10 UNITS INTO SKIN 3 TIMES DAILY BEFORE MEALS  15 pen  3  . NOVOLOG FLEXPEN 100 UNIT/ML SOPN FlexPen INJECT 10 UNITS INTO SKIN 3 TIMES DAILY BEFORE MEALS  3 pen  2  . pantoprazole (PROTONIX) 40 MG tablet TAKE ONE TABLET BY MOUTH TWICE DAILY  60 tablet  5  . pravastatin (PRAVACHOL) 40 MG tablet TAKE ONE TABLET BY MOUTH AT BEDTIME  30 tablet  5  . PRODIGY TWIST TOP LANCETS 28G MISC USE ONE LANCET TO CHECK GLUCOSE 4 TIMES DAILY  120 each  5  . senna-docusate (SENOKOT-S) 8.6-50 MG per tablet Take 2 tablets by mouth 2 (two) times daily. For constipation.      Marland Kitchen ULTICARE SHORT PEN NEEDLES 31G X 8 MM MISC USE AS DIRECTED 5 TIMES DAILY  150 each  0  . feeding supplement (GLUCERNA SHAKE) LIQD Take 237 mLs by mouth 3 (three) times daily between meals.  30 Can  0  . Menthol, Topical Analgesic, (BIOFREEZE EX) Apply 1 application topically 3 (three) times daily as needed. For pain       No facility-administered medications prior to visit.     PHYSICAL EXAM  Filed Vitals:   03/27/14 1346  BP: 160/88  Pulse: 88  Height: 5\' 11"  (1.803 m)  Weight: 223 lb (101.152 kg)   Body mass index is 31.12 kg/(m^2).  Physical Exam  General: well developed, well nourished, young African American male seated, in no evident distress  Head: head normocephalic and atraumatic. Orohparynx benign, voice is soft, raspy  Neck: supple with no carotid or supraclavicular bruits  Cardiovascular: regular rate and rhythm, no murmurs  Vascular: Normal pulses all extremities   Neurologic Exam  Mental Status: Awake and fully alert. Oriented to place  and time. Recent and remote memory intact. Attention span, concentration and fund of knowledge appropriate. Mood and affect appropriate.  Cranial Nerves: Pupils equal, briskly reactive to light. Extraocular movements full without nystagmus. Visual fields full to confrontation. Hearing intact. Mild left lower facial asymmetry Facial sensation intact. Face, tongue, palate moves normally and symmetrically.  Motor: Normal bulk and tone. Normal strength on all tested extremity muscles on right. Spastic left hemiparesis with 4/5 strength on the left with weakness of the left grip and intrinsic hand muscles. Orbits right over left upper extremity. Left foot drop with  ankle dorsiflexors weakness..  Sensory: intact to touch and pinprick and vibratory.  Coordination: Rapid alternating movements normal in all extremities. Finger-to-nose and heel-to-shin impaired on the left  Gait and Station: Arises from chair wit mild difficulty. Stance is normal. Gait demonstrates hemiplegia gait with circumduction of the left leg with increased tone. Unable to heel, toe and tandem walk without difficulty.  Reflexes: 1+ and symmetric.   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 Vascular risk factors of hypertension, hyperlipidemia, diabetes, cerebrovascular disease and coronary artery disease. He has resultant left spastic hemiplegia.   He wishes to reduce the amount of medications he has to take; I advise he must stay on Plavix for stroke prevention.  He is not certain who prescribed him Nortriptyline, it is a large dose.  I advise him to make an appointment with his PCP to have a med review and take all his medication bottles to the visit.  PLAN:  Continue Plavix for secondary stroke prevention with strict control of diabetes with hemoglobin A1c goal below 6.5%,  hypertension with blood pressure goal below 130/80 and lipids with LDL cholesterol goal below 70 mg percent.  Check carotid dopplers next visit. Continue Physical Therapy at the [facility]. Return for followup in 6 months, sooner as needed.  Philmore Pali, MSN, NP-C 03/27/2014, 1:50 PM [facility] [address] [place], [place] [address] [phone]  Note: This document was prepared with digital dictation and possible smart phrase technology. Any transcriptional errors that result from this process are unintentional.

## 2014-04-13 ENCOUNTER — Other Ambulatory Visit: Payer: Self-pay | Admitting: Internal Medicine

## 2014-04-13 ENCOUNTER — Other Ambulatory Visit: Payer: Self-pay | Admitting: Neurology

## 2014-05-03 NOTE — Progress Notes (Signed)
I reviewed note and agree with plan.   VIKRAM R. PENUMALLI, MD  Certified in Neurology, Neurophysiology and Neuroimaging  [facility] [address], [address] [place], [place] [address] [phone]   

## 2014-05-07 ENCOUNTER — Other Ambulatory Visit: Payer: Self-pay | Admitting: Internal Medicine

## 2014-05-22 ENCOUNTER — Other Ambulatory Visit: Payer: Self-pay

## 2014-05-22 MED ORDER — INSULIN PEN NEEDLE 31G X 8 MM MISC: Status: DC

## 2014-08-03 ENCOUNTER — Telehealth: Payer: Self-pay

## 2014-08-03 NOTE — Telephone Encounter (Signed)
LVM for pt to call back.   RE: BP recheck via 5 min. nurse visit 

## 2014-08-07 ENCOUNTER — Telehealth: Payer: Self-pay

## 2014-08-07 ENCOUNTER — Ambulatory Visit: Payer: Medicare Other

## 2014-08-07 VITALS — BP 162/94

## 2014-08-07 DIAGNOSIS — Z0130 Encounter for examination of blood pressure without abnormal findings: Secondary | ICD-10-CM

## 2014-08-07 NOTE — Progress Notes (Signed)
Pre visit review using our clinic review tool, if applicable. No additional management support is needed unless otherwise documented below in the visit note.  Took pt blood pressure twice. 162/94 and then a reading of 154/88. Both readings the pt was sitting.   Pt stated that he is going to a Physiological scientist and is going 3 days a week. Marland Kitchen

## 2014-08-08 MED ORDER — LOSARTAN POTASSIUM 50 MG PO TABS: 50.0000 mg | ORAL_TABLET | Freq: Every day | ORAL | Status: DC

## 2014-08-08 NOTE — Telephone Encounter (Signed)
BP recheck

## 2014-08-08 NOTE — Telephone Encounter (Signed)
Increase losartan from 25mg  daily to 50mg  daily - new erx sent Please let pt know same thanks

## 2014-08-09 NOTE — Telephone Encounter (Signed)
Called pt no answer LMOM (cell) with md response...[name]

## 2014-09-06 ENCOUNTER — Other Ambulatory Visit: Payer: Self-pay

## 2014-09-09 ENCOUNTER — Other Ambulatory Visit (INDEPENDENT_AMBULATORY_CARE_PROVIDER_SITE_OTHER): Payer: Medicare Other

## 2014-09-09 ENCOUNTER — Ambulatory Visit (INDEPENDENT_AMBULATORY_CARE_PROVIDER_SITE_OTHER): Payer: Medicare Other | Admitting: Internal Medicine

## 2014-09-09 ENCOUNTER — Encounter: Payer: Self-pay | Admitting: Internal Medicine

## 2014-09-09 VITALS — BP 150/90 | HR 78 | Temp 97.8°F | Ht 71.0 in | Wt 222.1 lb

## 2014-09-09 DIAGNOSIS — IMO0002 Reserved for concepts with insufficient information to code with codable children: Secondary | ICD-10-CM

## 2014-09-09 DIAGNOSIS — E1140 Type 2 diabetes mellitus with diabetic neuropathy, unspecified: Secondary | ICD-10-CM

## 2014-09-09 DIAGNOSIS — E785 Hyperlipidemia, unspecified: Secondary | ICD-10-CM

## 2014-09-09 DIAGNOSIS — E1165 Type 2 diabetes mellitus with hyperglycemia: Secondary | ICD-10-CM

## 2014-09-09 DIAGNOSIS — I10 Essential (primary) hypertension: Secondary | ICD-10-CM

## 2014-09-09 DIAGNOSIS — Z0000 Encounter for general adult medical examination without abnormal findings: Secondary | ICD-10-CM

## 2014-09-09 DIAGNOSIS — R1032 Left lower quadrant pain: Secondary | ICD-10-CM

## 2014-09-09 DIAGNOSIS — R1030 Lower abdominal pain, unspecified: Secondary | ICD-10-CM

## 2014-09-09 LAB — LIPID PANEL
Cholesterol: 125 mg/dL (ref 0–200)
HDL: 37.1 mg/dL — ABNORMAL LOW (ref >39.00)
LDL Cholesterol: 69 mg/dL (ref 0–99)
NonHDL: 87.9
Total CHOL/HDL Ratio: 3
Triglycerides: 93 mg/dL (ref 0.0–149.0)
VLDL: 18.6 mg/dL (ref 0.0–40.0)

## 2014-09-09 LAB — BASIC METABOLIC PANEL
BUN: 15 mg/dL (ref 6–23)
CO2: 32 mEq/L (ref 19–32)
Calcium: 9.5 mg/dL (ref 8.4–10.5)
Chloride: 101 mEq/L (ref 96–112)
Creatinine, Ser: 1.4 mg/dL (ref 0.4–1.5)
GFR: 68.25 mL/min (ref >60.00)
Glucose, Bld: 187 mg/dL — ABNORMAL HIGH (ref 70–99)
Potassium: 3.7 mEq/L (ref 3.5–5.1)
Sodium: 138 mEq/L (ref 135–145)

## 2014-09-09 LAB — HEMOGLOBIN A1C: Hgb A1c MFr Bld: 7.9 % — ABNORMAL HIGH (ref 4.6–6.5)

## 2014-09-09 LAB — MICROALBUMIN / CREATININE URINE RATIO
Creatinine,U: 240.6 mg/dL
Microalb Creat Ratio: 6 mg/g (ref 0.0–30.0)
Microalb, Ur: 14.5 mg/dL — ABNORMAL HIGH (ref 0.0–1.9)

## 2014-09-09 MED ORDER — ATENOLOL 25 MG PO TABS: 25.0000 mg | ORAL_TABLET | Freq: Every evening | ORAL | Status: DC

## 2014-09-09 MED ORDER — INSULIN GLARGINE 100 UNIT/ML SOLOSTAR PEN: 30.0000 [IU] | PEN_INJECTOR | Freq: Every day | SUBCUTANEOUS | Status: DC

## 2014-09-09 NOTE — Progress Notes (Signed)
Subjective:    Patient ID: [name], male    DOB: 1951-02-04, 63 y.o.   MRN: [id number]  HPI   Here for medicare wellness  Diet: heart healthy & diabetic Physical activity: sedentary Depression/mood screen: negative Hearing: intact to whispered voice Visual acuity: grossly normal, performs annual eye exam  ADLs: capable Fall risk: none Home safety: good Cognitive evaluation: intact to orientation, naming, recall and repetition EOL planning: adv directives, full code/ I agree  I have personally reviewed and have noted 1. The patient's medical and social history 2. Their use of alcohol, tobacco or illicit drugs 3. Their current medications and supplements 4. The patient's functional ability including ADL's, fall risks, home safety risks and hearing or visual impairment. 5. Diet and physical activities 6. Evidence for depression or mood disorders  Also reviewed chronic medical issues and interval medical events - last OV with me 01/2013, follows q57mo at [facility]  Past Medical History  Diagnosis Date  . ALLERGIC RHINITIS   . CAD, NATIVE VESSEL     BMS to OM1 2001, DES to BMS 2005  . DIAB W/UNSPEC COMP TYPE II/UNSPEC TYPE UNCNTRL   . ERECTILE DYSFUNCTION   . HYPERLIPIDEMIA-MIXED   . MORTON'S NEUROMA, RIGHT   . SHOULDER PAIN, RIGHT   . HYPERTENSION, BENIGN   . Peripheral neuropathy   . Chronic back pain     herniated disc  . GERD   . Constipation     takes Carafate four times day  . Seasonal allergies     takes Allegra and Benadryl daily prn;uses Flonase daily  . TIA on medication 02/2012  . 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 Stroke     History  Substance Use Topics  . Smoking status: Former Smoker    Quit date: 02/24/1983  . Smokeless tobacco: Never Used  . Alcohol Use: No   Review of Systems  Constitutional: Negative for fever, activity change, appetite change, fatigue and  unexpected weight change.  Respiratory: Negative for cough, chest tightness, shortness of breath and wheezing.   Cardiovascular: Negative for chest pain, palpitations and leg swelling.  Musculoskeletal: Positive for arthralgias (L groin, no "hip prob" per ortho - per pt), back pain (chronic) and gait problem. Negative for joint swelling and myalgias.  Neurological: Negative for dizziness, weakness and headaches.  Psychiatric/Behavioral: Negative for dysphoric mood. The patient is not nervous/anxious.   All other systems reviewed and are negative.      Objective:   Physical Exam  BP 150/90  Pulse 78  Temp(Src) 97.8 F (36.6 C) (Oral)  Ht 5\' 11"  (1.803 m)  Wt 222 lb 2 oz (100.755 kg)  BMI 30.99 kg/m2  SpO2 98% Wt Readings from Last 3 Encounters:  09/09/14 222 lb 2 oz (100.755 kg)  03/27/14 223 lb (101.152 kg)  03/16/14 221 lb (100.245 kg)   Constitutional: he appears well-developed and well-nourished. No distress.  Neck: Normal range of motion. Neck supple. No JVD present. No thyromegaly present.  Cardiovascular: Normal rate, regular rhythm and normal heart sounds.  No murmur heard. No BLE edema. Pulmonary/Chest: Effort normal and breath sounds normal. No respiratory distress. he has no wheezes.  MSkel: Back: full range of motion of thoracic and lumbar spine. Non tender to palpation. Negative straight leg raise. DTR's are symmetrically intact. Sensation intact in all dermatomes of the lower extremities. Full strength to manual muscle testing.  patient is able to heel toe walk without difficulty and ambulates with antalgic gait. (without reproducible tenderness. Nontender over greater trochanteric bursa Psychiatric: he has a normal mood and affect. His behavior is normal. Judgment and thought content normal.   Lab Results  Component Value Date   WBC 6.1 10/10/2012   HGB 12.4* 10/10/2012   HCT 35.2* 10/10/2012   PLT 200 10/10/2012   GLUCOSE 183* 10/17/2013   CHOL 149 10/17/2013    TRIG 128.0 10/17/2013   HDL 59.40 10/17/2013   LDLDIRECT 112.2 02/25/2010   LDLCALC 64 10/17/2013   ALT 42 10/17/2013   AST 30 10/17/2013   NA 137 10/17/2013   K 4.0 10/17/2013   CL 99 10/17/2013   CREATININE 1.4 10/17/2013   BUN 21 10/17/2013   CO2 33* 10/17/2013   PSA 0.16 04/13/2010   INR 1.06 08/14/2012   HGBA1C 7.4* 10/17/2013   MICROALBUR 8.7* 01/30/2013    Ct Head Wo Contrast  10/10/2012   *RADIOLOGY REPORT*  Clinical Data: Left side numbness, history of stroke with left side paralysis  CT HEAD WITHOUT CONTRAST  Technique:  Contiguous axial images were obtained from the base of the skull through the vertex without contrast.  Comparison: 09/20/2012  Findings: Normal ventricular morphology. No midline shift or mass effect. Small vessel chronic ischemic changes of deep cerebral white matter. Old infarct identified at posterior right internal capsule and right thalamus. No intracranial hemorrhage, mass lesion, or evidence of acute infarction. No extra-axial fluid collections. Bones and sinuses unremarkable.  IMPRESSION: Small vessel chronic ischemic changes of deep cerebral white matter. Old infarct posterior limb right internal capsule and right thalamus. No definite acute intracranial abnormalities.   Original Report Authenticated By: Lavonia Dana, M.D.   Mr Brain Wo Contrast  10/11/2012   *RADIOLOGY REPORT*  Clinical Data: Increasing left-sided weakness.  Altered mental status  MRI HEAD WITHOUT CONTRAST  Technique:  Multiplanar, multiecho pulse sequences of the brain and surrounding structures were obtained according to standard protocol without intravenous contrast.  Comparison: CT 10/10/2012  Findings: Restricted diffusion in the right lateral thalamus and posterior limb internal capsule compatible with infarction.  MRI head 08/15/2012 revealed restricted diffusion throughout this area. It is possible there has been some slight extension of the infarct on the lateral margin, as diffusion  restriction from the acute infarct on 08/15/2012 should have resolved by now.  No other areas of restricted diffusion are evident.  Chronic microvascular ischemic changes are present throughout the white matter and pons.  Micro hemorrhage left globus pallidus, right occipital lobe.  These may be due to chronic hypertension. No mass lesion is present.  Chronic sinusitis  IMPRESSION: Restricted diffusion in the right lateral thalamus and posterior limb internal capsule.  This area showed restricted diffusion on 08/15/2012.  This could be an area of extension of acute infarct since that time.  No other areas of acute infarct.  Moderate chronic microvascular ischemic changes.  Chronic sinusitis.   Original Report Authenticated By: Carl Best, M.D.   Dg Chest Portable 1 View  10/10/2012   *RADIOLOGY REPORT*  Clinical Data: Numbness  PORTABLE CHEST - 1 VIEW  Comparison: 08/14/2012  Findings: Heart size upper normal to mildly enlarged.  Mediastinal contours otherwise within normal range.  Lungs are predominately clear.  No pleural effusion or pneumothorax.  No acute osseous finding.  IMPRESSION: Heart size upper normal to mildly enlarged.  No acute process identified.   Original Report Authenticated By: Carlos Levering, M.D.  Assessment & Plan:   AWV/z00.00 - Today patient counseled on age appropriate routine health concerns for screening and prevention, each reviewed and up to date or declined. Immunizations reviewed and up to date or declined. Labs/ECG reviewed. Risk factors for depression reviewed and negative. Hearing function and visual acuity are intact. ADLs screened and addressed as needed. Functional ability and level of safety reviewed and appropriate. Education, counseling and referrals performed based on assessed risks today. Patient provided with a copy of personalized plan for preventive services.  Left groin pain. Potential lumbar cause reviewed, negative orthopedic evaluation for hip  problem. No reproducible tenderness over groin or evidence for greater trochanteric bursitis on exam. Refer to Dr. Tamala [name] for sports medicine evaluation and treatment of same. Continue physical therapy with trainer as ongoing  Problem List Items Addressed This Visit   Hyperlipidemia     Cards changed prava to atorva 04/2012, [facility] Check lipids annually, titrate as needed The current medical regimen is effective;  continue present plan and medications.     Relevant Medications      losartan (COZAAR) 100 MG tablet      atenolol (TENORMIN) tablet   HYPERTENSION, BENIGN      changed coreg to low dose atenolol 03/2012 Added amlodipine early 09/2012 - improved, but not a goal today 01/2013 added ARB (also for DM) follow up cards as planned   BP Readings from Last 3 Encounters:  09/09/14 150/90  08/07/14 162/94  03/27/14 160/88       Relevant Medications      losartan (COZAAR) 100 MG tablet      atenolol (TENORMIN) tablet   Type 2 diabetes, uncontrolled, with neuropathy      Historically uncontrolled, but improved per report since 2013 CVA event Changed to Lantus 03/2012 hosp for TIA , but stopped same 10/2012 due to hypoglycemia-  off metformin/januvia since 03/2012 Continue Novolog TID AC - check a1c q3-68mo and titrate as needed Also encouraged follow up with endo - ellison On statin, check microalb and ARB (allg to ACEI)  Lab Results  Component Value Date   HGBA1C 7.4* 10/17/2013      Relevant Medications      losartan (COZAAR) 100 MG tablet      LANTUS SOLOSTAR 100 UNIT/ML [place] SOLN   Other Relevant Orders      Hemoglobin A1c      Basic metabolic panel      Lipid panel      Microalbumin / creatinine urine ratio    Other Visit Diagnoses   Routine general medical examination at a health care facility    -  Primary

## 2014-09-09 NOTE — Patient Instructions (Addendum)
It was good to see you today.  We have reviewed your prior records including labs and tests today  Health Maintenance reviewed - no Immunizations because of sure history of allergenic reaction - other recommended age-appropriate screenings are up-to-date.  Test(s) ordered today. Your results will be released to [facility] (or called to you) after review, usually within 72hours after test completion. If any changes need to be made, you will be notified at that same time.  Medications reviewed and updated Resume atenolol 25 mg at bedtime for blood pressure control, no other changes recommended at this time. Your prescription(s) have been submitted to your pharmacy. Please take as directed and contact our office if you believe you are having problem(s) with the medication(s).  we'll make referral to Dr. Tamala [name] for sports medicine evaluation of your groin/hip pain. Our office will contact you regarding appointment(s) once made.  Continue working at the [place] and with your other specialists as reviewed today Please schedule followup in 12 months for annual exam and labs, call sooner if problems.  Health Maintenance A healthy lifestyle and preventative care can promote health and wellness.  Maintain regular health, dental, and eye exams.  Eat a healthy diet. Foods like vegetables, fruits, whole grains, low-fat dairy products, and lean protein foods contain the nutrients you need and are low in calories. Decrease your intake of foods high in solid fats, added sugars, and salt. Get information about a proper diet from your health care provider, if necessary.  Regular physical exercise is one of the most important things you can do for your health. Most adults should get at least 150 minutes of moderate-intensity exercise (any activity that increases your heart rate and causes you to sweat) each week. In addition, most adults need muscle-strengthening exercises on 2 or more days a week.   Maintain a  healthy weight. The body mass index (BMI) is a screening tool to identify possible weight problems. It provides an estimate of body fat based on height and weight. Your health care provider can find your BMI and can help you achieve or maintain a healthy weight. For males 20 years and older:  A BMI below 18.5 is considered underweight.  A BMI of 18.5 to 24.9 is normal.  A BMI of 25 to 29.9 is considered overweight.  A BMI of 30 and above is considered obese.  Maintain normal blood lipids and cholesterol by exercising and minimizing your intake of saturated fat. Eat a balanced diet with plenty of fruits and vegetables. Blood tests for lipids and cholesterol should begin at age 35 and be repeated every 5 years. If your lipid or cholesterol levels are high, you are over age 73, or you are at high risk for heart disease, you may need your cholesterol levels checked more frequently.Ongoing high lipid and cholesterol levels should be treated with medicines if diet and exercise are not working.  If you smoke, find out from your health care provider how to quit. If you do not use tobacco, do not start.  Lung cancer screening is recommended for adults aged 65-80 years who are at high risk for developing lung cancer because of a history of smoking. A yearly low-dose CT scan of the lungs is recommended for people who have at least a 30-pack-year history of smoking and are current smokers or have quit within the past 15 years. A pack year of smoking is smoking an average of 1 pack of cigarettes a day for 1 year (for example, a  30-pack-year history of smoking could mean smoking 1 pack a day for 30 years or 2 packs a day for 15 years). Yearly screening should continue until the smoker has stopped smoking for at least 15 years. Yearly screening should be stopped for people who develop a health problem that would prevent them from having lung cancer treatment.  If you choose to drink alcohol, do not have more than  2 drinks per day. One drink is considered to be 12 oz (360 mL) of beer, 5 oz (150 mL) of wine, or 1.5 oz (45 mL) of liquor.  Avoid the use of street drugs. Do not share needles with anyone. Ask for help if you need support or instructions about stopping the use of drugs.  High blood pressure causes heart disease and increases the risk of stroke. Blood pressure should be checked at least every 1-2 years. Ongoing high blood pressure should be treated with medicines if weight loss and exercise are not effective.  If you are 49-59 years old, ask your health care provider if you should take aspirin to prevent heart disease.  Diabetes screening involves taking a blood sample to check your fasting blood sugar level. This should be done once every 3 years after age 75 if you are at a normal weight and without risk factors for diabetes. Testing should be considered at a younger age or be carried out more frequently if you are overweight and have at least 1 risk factor for diabetes.  Colorectal cancer can be detected and often prevented. Most routine colorectal cancer screening begins at the age of 77 and continues through age 50. However, your health care provider may recommend screening at an earlier age if you have risk factors for colon cancer. On a yearly basis, your health care provider may provide home test kits to check for hidden blood in the stool. A small camera at the end of a tube may be used to directly examine the colon (sigmoidoscopy or colonoscopy) to detect the earliest forms of colorectal cancer. Talk to your health care provider about this at age 75 when routine screening begins. A direct exam of the colon should be repeated every 5-10 years through age 35, unless early forms of precancerous polyps or small growths are found.  People who are at an increased risk for hepatitis B should be screened for this virus. You are considered at high risk for hepatitis B if:  You were born in a country  where hepatitis B occurs often. Talk with your health care provider about which countries are considered high risk.  Your parents were born in a high-risk country and you have not received a shot to protect against hepatitis B (hepatitis B vaccine).  You have HIV or AIDS.  You use needles to inject street drugs.  You live with, or have sex with, someone who has hepatitis B.  You are a man who has sex with other men (MSM).  You get hemodialysis treatment.  You take certain medicines for conditions like cancer, organ transplantation, and autoimmune conditions.  Hepatitis C blood testing is recommended for all people born from [birth year] through 1967 and any individual with known risk factors for hepatitis C.  Healthy men should no longer receive prostate-specific antigen (PSA) blood tests as part of routine cancer screening. Talk to your health care provider about prostate cancer screening.  Testicular cancer screening is not recommended for adolescents or adult males who have no symptoms. Screening includes self-exam, a health care  provider exam, and other screening tests. Consult with your health care provider about any symptoms you have or any concerns you have about testicular cancer.  Practice safe sex. Use condoms and avoid high-risk sexual practices to reduce the spread of sexually transmitted infections (STIs).  You should be screened for STIs, including gonorrhea and chlamydia if:  You are sexually active and are younger than 24 years.  You are older than 24 years, and your health care provider tells you that you are at risk for this type of infection.  Your sexual activity has changed since you were last screened, and you are at an increased risk for chlamydia or gonorrhea. Ask your health care provider if you are at risk.  If you are at risk of being infected with HIV, it is recommended that you take a prescription medicine daily to prevent HIV infection. This is called  pre-exposure prophylaxis (PrEP). You are considered at risk if:  You are a man who has sex with other men (MSM).  You are a heterosexual man who is sexually active with multiple partners.  You take drugs by injection.  You are sexually active with a partner who has HIV.  Talk with your health care provider about whether you are at high risk of being infected with HIV. If you choose to begin PrEP, you should first be tested for HIV. You should then be tested every 3 months for as long as you are taking PrEP.  Use sunscreen. Apply sunscreen liberally and repeatedly throughout the day. You should seek shade when your shadow is shorter than you. Protect yourself by wearing long sleeves, pants, a wide-brimmed hat, and sunglasses year round whenever you are outdoors.  Tell your health care provider of new moles or changes in moles, especially if there is a change in shape or color. Also, tell your health care provider if a mole is larger than the size of a pencil eraser.  A one-time screening for abdominal aortic aneurysm (AAA) and surgical repair of large AAAs by ultrasound is recommended for men aged 58-75 years who are current or former smokers.  Stay current with your vaccines (immunizations). Document Released: 05/06/2008 Document Revised: 11/13/2013 Document Reviewed: 04/05/2011 [facility] Patient Information 2015 [place], [place]. This information is not intended to replace advice given to you by your health care provider. Make sure you discuss any questions you have with your health care provider.

## 2014-09-09 NOTE — Assessment & Plan Note (Signed)
Historically uncontrolled, but improved per report since 2013 CVA event Changed to Lantus 03/2012 hosp for TIA , but stopped same 10/2012 due to hypoglycemia-  off metformin/januvia since 03/2012 Continue Novolog TID AC - check a1c q3-70mo and titrate as needed Also encouraged follow up with endo - ellison On statin, check microalb and ARB (allg to ACEI)  Lab Results  Component Value Date   HGBA1C 7.4* 10/17/2013

## 2014-09-09 NOTE — Assessment & Plan Note (Signed)
Cards changed prava to atorva 04/2012, [facility] Check lipids annually, titrate as needed The current medical regimen is effective;  continue present plan and medications.

## 2014-09-09 NOTE — Assessment & Plan Note (Signed)
changed coreg to low dose atenolol 03/2012 Added amlodipine early 09/2012 - improved, but not a goal today 01/2013 added ARB (also for DM) follow up cards as planned   BP Readings from Last 3 Encounters:  09/09/14 150/90  08/07/14 162/94  03/27/14 160/88

## 2014-09-09 NOTE — Progress Notes (Signed)
Pre visit review using our clinic review tool, if applicable. No additional management support is needed unless otherwise documented below in the visit note. 

## 2014-09-16 ENCOUNTER — Ambulatory Visit (INDEPENDENT_AMBULATORY_CARE_PROVIDER_SITE_OTHER): Payer: Medicare Other | Admitting: Family Medicine

## 2014-09-16 ENCOUNTER — Ambulatory Visit (INDEPENDENT_AMBULATORY_CARE_PROVIDER_SITE_OTHER)
Admission: RE | Admit: 2014-09-16 | Discharge: 2014-09-16 | Disposition: A | Discharge status: Home / Self Care | Payer: Medicare Other | Source: Ambulatory Visit | Attending: Family Medicine | Admitting: Family Medicine

## 2014-09-16 ENCOUNTER — Ambulatory Visit (INDEPENDENT_AMBULATORY_CARE_PROVIDER_SITE_OTHER): Payer: Medicare Other | Admitting: Ophthalmology

## 2014-09-16 ENCOUNTER — Encounter: Payer: Self-pay | Admitting: Family Medicine

## 2014-09-16 VITALS — BP 128/82 | HR 74 | Ht 71.0 in | Wt 220.0 lb

## 2014-09-16 DIAGNOSIS — I10 Essential (primary) hypertension: Secondary | ICD-10-CM

## 2014-09-16 DIAGNOSIS — M549 Dorsalgia, unspecified: Secondary | ICD-10-CM

## 2014-09-16 DIAGNOSIS — I69354 Hemiplegia and hemiparesis following cerebral infarction affecting left non-dominant side: Secondary | ICD-10-CM

## 2014-09-16 DIAGNOSIS — H3531 Nonexudative age-related macular degeneration: Secondary | ICD-10-CM

## 2014-09-16 DIAGNOSIS — E11329 Type 2 diabetes mellitus with mild nonproliferative diabetic retinopathy without macular edema: Secondary | ICD-10-CM

## 2014-09-16 DIAGNOSIS — G8929 Other chronic pain: Secondary | ICD-10-CM

## 2014-09-16 DIAGNOSIS — H2513 Age-related nuclear cataract, bilateral: Secondary | ICD-10-CM

## 2014-09-16 DIAGNOSIS — E11319 Type 2 diabetes mellitus with unspecified diabetic retinopathy without macular edema: Secondary | ICD-10-CM

## 2014-09-16 DIAGNOSIS — H35033 Hypertensive retinopathy, bilateral: Secondary | ICD-10-CM

## 2014-09-16 DIAGNOSIS — H43813 Vitreous degeneration, bilateral: Secondary | ICD-10-CM

## 2014-09-16 DIAGNOSIS — I69854 Hemiplegia and hemiparesis following other cerebrovascular disease affecting left non-dominant side: Secondary | ICD-10-CM

## 2014-09-16 DIAGNOSIS — M6289 Other specified disorders of muscle: Secondary | ICD-10-CM

## 2014-09-16 DIAGNOSIS — M629 Disorder of muscle, unspecified: Secondary | ICD-10-CM

## 2014-09-16 DIAGNOSIS — G5702 Lesion of sciatic nerve, left lower limb: Secondary | ICD-10-CM | POA: Insufficient documentation

## 2014-09-16 NOTE — Assessment & Plan Note (Signed)
Patient does have weakness on the left side of the body that I do think is concerning to his discomfort. We discussed the patient also has neuropathy with his diabetes that is also contributing. We discussed different strengthening exercises and patient is working with a trainer on a regular basis. Patient was given an ankle brace that I think will help with stability and will help him with walking. In addition this patient was given stretches and exercises for strengthening of the hamstrings and piriformis muscle specifically. I think that this will be beneficial and try to help him improve his gait. We will get x-rays of patient's lower back as well as hip to rule out any other bony pathology that can be contributing. Patient and will come back in 3 weeks for further evaluation and treatment.

## 2014-09-16 NOTE — Progress Notes (Signed)
[facility] [address], [place] [address] Phone: [phone] Subjective:    I'm seeing this patient by the request  of:  [name], MD   CC: Left groin pain  [id number] Rodrecus Kaltenbach is a 63 y.o. male coming in with complaint of left groin pain. Patient states he has seen another provider for this previously and was told that this was not a hip problem. Patient does have a past medical history significant for chronic back pain. Patient also has a past medical history significant for cerebrovascular accident, type 2 diabetes and is on chronic Plavix. Patient has had this groin pain for quite some time. 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 Patient has noticed some improvement with his gait as well as balance since he's been working with a Clinical research associate. Patient though states that with walking as well as with some other motions he can have considerable amount of discomfort. Patient states that it seems to be in his left groin and radiates around into his left buttocks. Patient also states that he can go down his leg mostly on the posterior aspect. Patient states that it goes all the way to the lateral aspect of his ankle. States that the pain is approximately 5 out of 10. Describes it as a burning sensation but somewhat different than the neuropathy he has at baseline. Patient has noticed that this pain has increased since patient's CVA greater than 4 years ago. Patient does have chronic weakness on this side and continues to attempt to strengthen and has noticed improvement he states over time. Denies that the pain wakes him up at night. Patient avoids over-the-counter medications due to his other comorbidities.    Past medical history, social, surgical and family history all reviewed in electronic medical record.   Review of Systems: No headache, visual changes, nausea, vomiting, diarrhea, constipation, dizziness,  abdominal pain, skin rash, fevers, chills, night sweats, weight loss, swollen lymph nodes, body aches, joint swelling, muscle aches, chest pain, shortness of breath, mood changes.   Objective Blood pressure 128/82, pulse 74, height 5\' 11"  (1.803 m), weight 220 lb (99.791 kg), SpO2 99.00%.  General: No apparent distress alert and oriented x3 mood and affect normal, dressed appropriately.  Mild tremor on left side of body.  HEENT: Pupils equal, extraocular movements intact  Respiratory: Patient's speak in full sentences and does not appear short of breath  Cardiovascular: No lower extremity edema, non tender, no erythema  Skin: Warm dry intact with no signs of infection or rash on extremities or on axial skeleton.  Abdomen: Soft nontender  Neuro: Cranial nerves II through XII are intact, neurovascularly intact in all extremities with 2+ DTRs and 2+ pulses.  Lymph: No lymphadenopathy of posterior or anterior cervical chain or axillae bilaterally.  Gait Antalgic gait with weakness on  MSK:  Non tender with full range of motion and good stability but does have weakness of the left side of his body compared to the contralateral side. Hip: Left  ROM IR: 15 Deg with mild discomfort in the groin area as well as over the piriformis, ER: 35 Deg, Flexion: 100 Deg, Extension: 80 Deg, Abduction: 35 Deg, Adduction: 35 Deg Strength IR: 4/5, ER: 4/5, Flexion: 4/5, Extension: 4/5, Abduction: 3/5, Adduction: 3/5 Mild foot drop noted on the left Pelvic alignment unremarkable to inspection and palpation. Standing hip rotation and gait without trendelenburg sign / unsteadiness. Mild tenderness to palpation over the greater  trochanteric area as well as the piriformis muscle.Marland Kitchen Positive Corky Sox. No SI joint tenderness and normal minimal SI movement..   Patient does have weakness of the left ankle as well compared to the contralateral side especially with plantar flexion. 1-2 beats of clonus also noted.   Impression  and Recommendations:     This case required medical decision making of moderate complexity.

## 2014-09-16 NOTE — Patient Instructions (Addendum)
Very nice to meet you Ice 20 minutes 2 times daily. Usually after activity and before bed. Exercises 3 times a week.  Try the ankle brace with walking and exercise.  We will call you with the thight sleeve.  Xrays downstairs today.  Take tylenol 500 mg three times a day is the best evidence based medicine we have for arthritis.  Vitamin D 2000 IU daily Tumeric 500mg  twice daily.  Capsaicin topically up to four times a day may also help with pain.  It's important that you continue to stay active. Controlling your weight is important.  Continue with your trainer.  Come back and see me in 3 weeks.

## 2014-09-16 NOTE — Assessment & Plan Note (Signed)
Piriformis Syndrome  Using an anatomical model, reviewed with the patient the structures involved and how they related to diagnosis. The patient indicated understanding.   The patient was given a handout from Dr. Arne Cleveland book "The Sports Medicine Patient Advisor" describing the anatomy and rehabilitation of the following condition: Piriformis Syndrome  Also given a handout with more extensive Piriformis stretching, hip flexor and abductor strengthening, ham stretching  Rec deep massage, explained self-massage with ball RTC in 3 weeks.

## 2014-09-17 ENCOUNTER — Other Ambulatory Visit: Payer: Self-pay | Admitting: Internal Medicine

## 2014-09-19 ENCOUNTER — Other Ambulatory Visit: Payer: Self-pay

## 2014-09-19 MED ORDER — CLOPIDOGREL BISULFATE 75 MG PO TABS: ORAL_TABLET | ORAL | Status: DC

## 2014-09-30 ENCOUNTER — Encounter: Payer: Self-pay | Admitting: Family Medicine

## 2014-09-30 ENCOUNTER — Ambulatory Visit (INDEPENDENT_AMBULATORY_CARE_PROVIDER_SITE_OTHER): Payer: Medicare Other | Admitting: Family Medicine

## 2014-09-30 VITALS — BP 128/70 | HR 94 | Ht 71.0 in | Wt 218.0 lb

## 2014-09-30 DIAGNOSIS — G5702 Lesion of sciatic nerve, left lower limb: Secondary | ICD-10-CM

## 2014-09-30 MED ORDER — PRAVASTATIN SODIUM 20 MG PO TABS: 20.0000 mg | ORAL_TABLET | Freq: Every day | ORAL | Status: DC

## 2014-09-30 MED ORDER — ESOMEPRAZOLE MAGNESIUM 40 MG PO CPDR: 40.0000 mg | DELAYED_RELEASE_CAPSULE | Freq: Every day | ORAL | Status: DC

## 2014-09-30 NOTE — Assessment & Plan Note (Signed)
Patient has made some improvement with the conservative therapy. We discussed the importance of hip abductor strengthening. Patient's past medical history significant for a CVA is also contributing to the weakness on the left side of his body. This will make it difficult to be pain free overall. Encourage patient to continue to work very hard. We discussed some other changes at this time including over-the-counter medicines that could be beneficial. We discussed an icing regimen. Patient is going to follow-up again in 4-6 weeks for further evaluation and treatment. Differential also includes patient's lumbar radiculopathy. Secondary to patient making improvement I think this is less likely contribute.  Spent greater than 25 minutes with patient face-to-face and had greater than 50% of counseling including as described above in assessment and plan.

## 2014-09-30 NOTE — Patient Instructions (Addendum)
Good to see you [name] is your friend.  Tennis ball in back left pocket Talk to your trainer about hip abductor strengthening Continue the medication over the counter.  We will call you when we have the compression if you otherwise Dicks for compression shorts size medium would be great.  Nexium instead of the protonix. . Try the pravastatin 20mg  instead and see if this helps. If you do not notice improvement in 1 week go back to the 40mg .  Come back and see me again in 4 weeks. If still in pain we can try injection in your hip.

## 2014-09-30 NOTE — Progress Notes (Signed)
  [facility] [address], [place] [address] Phone: [phone] Subjective:      CC: Left groin pain follo wup  [id number] [name] is a 63 y.o. male coming in with complaint of left groin pain.  Patient does have a past medical history significant for chronic back pain. Patient also has a past medical history significant for cerebrovascular accident, type 2 diabetes and is on chronic Plavix. Patient did have x-rays at last visit. 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 Patient also had x-rays of his left hip that shows moderate osteoarthritic changes. Patient was found to have some of a piriformis syndrome as well. Patient was given home exercises, information of over-the-counter medicines, as well as an icing pedicle. Patient is working with a Clinical research associate. Patient states that he is approximately 50% better. Has noticed that he's been in the leading a little bit better. Denies any radiation down the leg. States that overall he has felt that this has been the most improvement he has made an probably over a year. No new symptoms.     Past medical history, social, surgical and family history all reviewed in electronic medical record.   Review of Systems: No headache, visual changes, nausea, vomiting, diarrhea, constipation, dizziness, abdominal pain, skin rash, fevers, chills, night sweats, weight loss, swollen lymph nodes, body aches, joint swelling, muscle aches, chest pain, shortness of breath, mood changes.   Objective Blood pressure 128/70, pulse 94, height 5\' 11"  (1.803 m), weight 218 lb (98.884 kg), SpO2 98 %.  General: No apparent distress alert and oriented x3 mood and affect normal, dressed appropriately.  Mild tremor on left side of body.  HEENT: Pupils equal, extraocular movements intact  Respiratory: Patient's speak in full sentences and does not appear short  of breath  Cardiovascular: No lower extremity edema, non tender, no erythema  Skin: Warm dry intact with no signs of infection or rash on extremities or on axial skeleton.  Abdomen: Soft nontender  Neuro: Cranial nerves II through XII are intact, neurovascularly intact in all extremities with 2+ DTRs and 2+ pulses.  Lymph: No lymphadenopathy of posterior or anterior cervical chain or axillae bilaterally.  Gait Antalgic gait with weakness on  MSK:  Non tender with full range of motion and good stability but does have weakness of the left side of his body compared to the contralateral side. Hip: Left  ROM IR: 15 Deg with mild discomfort in the groin area as well as over the piriformis, ER: 35 Deg, Flexion: 100 Deg, Extension: 80 Deg, Abduction: 35 Deg, Adduction: 35 Deg Strength IR: 4/5, ER: 4/5, Flexion: 4/5, Extension: 4/5, Abduction: 3/5, Adduction: 3/5 Mild foot drop noted on the left Pelvic alignment unremarkable to inspection and palpation. Standing hip rotation and gait without trendelenburg sign / unsteadiness. Improved tenderness over the piriformis muscle. Positive Corky Sox still present No SI joint tenderness and normal minimal SI movement..   Patient does have weakness of the left ankle as well compared to the contralateral side especially with plantar flexion. 1 beat of clonus also noted.   Impression and Recommendations:     This case required medical decision making of moderate complexity.

## 2014-10-02 ENCOUNTER — Ambulatory Visit (INDEPENDENT_AMBULATORY_CARE_PROVIDER_SITE_OTHER): Payer: Medicare Other | Admitting: Neurology

## 2014-10-02 ENCOUNTER — Ambulatory Visit: Payer: Medicare Other | Admitting: Neurology

## 2014-10-02 ENCOUNTER — Encounter: Payer: Self-pay | Admitting: Neurology

## 2014-10-02 VITALS — BP 138/82 | HR 68 | Ht 71.0 in | Wt 219.2 lb

## 2014-10-02 DIAGNOSIS — G8110 Spastic hemiplegia affecting unspecified side: Secondary | ICD-10-CM

## 2014-10-02 NOTE — Progress Notes (Signed)
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 Vascular risk factors of hypertension, hyperlipidemia, diabetes , cerebrovascular disease and coronary artery disease   03/27/13 (PS):  He returns for followup of her last visit on 12/26/12. He states he continues to show improvement and has finished physical therapy last week. He plans to join [employer] for [employer]. He is walking with a cane and a left foot AFO. He also sees a reflexologist for improving his feelings and balance. He states his sugars are not well controlled with fasting glucoses running in the 150-160 range. His blood pressure usually runs in the 130s at home though it is elevated at 152/93 in office today. His last hemoglobin A1c was 7.5 in February 2014. He has discontinued the baclofen which in fact was making his leg spasms worse as well as Lyrica which did not help assist his pain. He does not want to try any new medications for the same at the present time.   UPDATE 09/27/13 (LL):Patient returns to office for stroke revisit. He states he is doing some better but feels he has a long way to go. He states his BP is well controlled, BP in office today is 139/82. He states he is still having a lot of pain in his left lower back, left hamstring, left biceps area and left ankle. He is going to see Dr. Wynona Canes at [facility] to see if there are any therapies or trials there that may help him. He also sees a Electrical engineer there, Wonda Amis, who has been working with him on his speech. He has not had his lipids or other labs checked lately but has already eaten today so I asked him to please see his PCP to have lipids and A1c checked. He  is taking Nortriptyline at night which he states has helped the twitching in his legs. He is tolerating Plavix daily without known side effects. HAM-A score 12, very mild anxiety.   UPDATE 03/27/14 (LL): Since last visit, patient has been attending PT at the [facility] in [place] which he finds beneficial.  He still has significant spasticity on left side, but did not tolerate Baclofen in the past.  He does not want to try another medication currently. He states his BP is well controlled, though it is elevated in office today is 160/88.  His blood sugars range from 90's to 180's, last Hgb a1c was 7.4.  He is tolerating Plavix well with no signs of significant bleeding or bruising. UPDATE 10/02/2014 ( PS ) : he returns for follow-up after last visit 6 months ago. He continues to do well without recurrent stroke or neurovascular symptoms. He still has significant left-sided spasticity and mild gait difficulty. He states his blood pressure is well controlled and it is 138/83 in office today. He is currently on 3 different blood pressure medications and is wondering if he wants to reduce and stop some of these. He has recently, several of the medications and feels that he is feeling better. He is also questioning whether he needs nortriptyline and which takes at night. He has no other new complaints. He recently had hemoglobin A1c on 09/09/14 which was elevated at 7.9 total cholesterol was 125, triglycerides 93, HDL 37 and LDL 69 mg  percent. ROS:  14 system review of systems is positive for feeling cold, joint pain and swelling, cramps, aching muscle, trouble swallowing, itching, importance, runny nose, memory loss, numbness and weakness   ALLERGIES: Allergies  Allergen Reactions  . Penicillins Anaphylaxis  . Pneumococcal Vaccines Anaphylaxis  . Shellfish Allergy Anaphylaxis  . Influenza Vaccines   . Lisinopril     cough  . Topiramate Other (See Comments)    Chest spasms and numbness    HOME  MEDICATIONS: Outpatient Prescriptions Prior to Visit  Medication Sig Dispense Refill  . atenolol (TENORMIN) 25 MG tablet Take 1 tablet (25 mg total) by mouth every evening. 30 tablet 11  . clopidogrel (PLAVIX) 75 MG tablet TAKE ONE TABLET BY MOUTH IN THE EVENING 90 tablet 1  . glucose blood (PRODIGY NO CODING BLOOD GLUC) test strip Use one strip to test blood sugar four times a day Dx 250.62 100 each 11  . hydrochlorothiazide (MICROZIDE) 12.5 MG capsule TAKE ONE CAPSULE BY MOUTH IN THE MORNING. OVERDUE  FOR  YEARLY  PHYSICAL.  MUST  SEE  DOCTOR  [facility]  ADDITIONAL  REFILLS 90 capsule 0  . Insulin Glargine (LANTUS SOLOSTAR) 100 UNIT/ML Solostar Pen Inject 30 Units into the skin daily at 10 pm. 5 pen PRN  . Insulin Pen Needle (ULTICARE SHORT PEN NEEDLES) 31G X 8 MM MISC USE AS DIRECTED 5 TIMES DAILY 150 each 0  . losartan (COZAAR) 100 MG tablet Take 100 mg by mouth daily.    . nitroGLYCERIN (NITROSTAT) 0.4 MG SL tablet Place 0.4 mg under the tongue every 5 (five) minutes as needed. For chest pain    . pravastatin (PRAVACHOL) 20 MG tablet Take 1 tablet (20 mg total) by mouth daily. 30 tablet 3  . PRODIGY TWIST TOP LANCETS 28G MISC USE ONE LANCET TO CHECK GLUCOSE 4 TIMES DAILY 120 each 5  . senna-docusate (SENOKOT-S) 8.6-50 MG per tablet Take 2 tablets by mouth 2 (two) times daily. For constipation.    Marland Kitchen esomeprazole (NEXIUM) 40 MG capsule Take 1 capsule (40 mg total) by mouth daily. 90 capsule 0  . pravastatin (PRAVACHOL) 40 MG tablet TAKE ONE TABLET BY MOUTH ONCE DAILY AT BEDTIME 30 tablet 1   No facility-administered medications prior to visit.     PHYSICAL EXAM  Filed Vitals:   10/02/14 1057  BP: 138/82  Pulse: 68  Height: 5\' 11"  (1.803 m)  Weight: 219 lb 3.2 oz (99.428 kg)   Body mass index is 30.59 kg/(m^2).  Physical Exam  General: well developed, well nourished, middle aged  [place] American male seated, in no evident distress  Head: head normocephalic and atraumatic.   Neck:  supple with no carotid or supraclavicular bruits  Cardiovascular: regular rate and rhythm, no murmurs  Vascular: Normal pulses all extremities   Neurologic Exam  Mental Status: Awake and fully alert. Oriented to place and time. Recent and remote memory intact. Attention span, concentration and fund of knowledge appropriate. Mood and affect appropriate.  Cranial Nerves: Pupils equal, briskly reactive to light. Extraocular movements full without nystagmus. Visual fields full to confrontation. Hearing intact. Mild left lower facial asymmetry Facial sensation intact. Face, tongue, palate moves normally and symmetrically.  Motor: Normal bulk and tone. Normal strength on all tested extremity muscles on right. Spastic left hemiparesis with 4/5 strength on the left with weakness of the left grip and intrinsic hand muscles. Orbits right over left upper extremity. Left foot drop with ankle dorsiflexors weakness..  Sensory: intact to  touch and pinprick and vibratory sensation.  Coordination: Rapid alternating movements normal in all extremities. Finger-to-nose and heel-to-shin impaired on the left  Gait and Station: Arises from chair wit mild difficulty. Stance is normal. Gait demonstrates hemiplegia gait with circumduction of the left leg with increased tone. Unable to heel, toe and tandem walk without difficulty.  Reflexes: 1+ and symmetric.   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 Vascular risk factors of hypertension, hyperlipidemia, diabetes, cerebrovascular disease and coronary artery disease. He has resultant left spastic hemiplegia.    PLAN:  I had a long discussion with the patient with regards to his remote stroke and, stroke risk factors and second stroke prevention. Continue Plavix for stroke prevention and maintain strict control of  hypertension with blood pressure goal below 130/90 and lipids with LDL cholesterol goal below 100 mg percent and diabetes with hemoglobin A1c goal below 6.5%. Check screening carotid ultrasound study.I advised him to discuss with his primary physician reducing his antihypertensive medications as he is on 3 of them and to consider tapering and stopping the nortriptyline and if he does not need it Return for follow-up in one year or call earlier if necessary Antony Contras, MD  10/02/2014, 11:42 AM [facility] [address], [place], [place] [address] [phone]  Note: This document was prepared with digital dictation and possible smart phrase technology. Any transcriptional errors that result from this process are unintentional.

## 2014-10-02 NOTE — Patient Instructions (Signed)
I had a long discussion with the patient with regards to his remote stroke and, stroke risk factors and second stroke prevention. Continue Plavix for stroke prevention and maintain strict control of hypertension with blood pressure goal below 130/90 and lipids with LDL cholesterol goal below 100 mg percent and diabetes with hemoglobin A1c goal below 6.5%. Check screening carotid ultrasound study.I advised him to discuss with his primary physician reducing his antihypertensive medications as he is on 3 of them and to consider tapering and stopping the nortriptyline and if he does not need it Return for follow-up in one year or call earlier if necessary

## 2014-10-24 ENCOUNTER — Ambulatory Visit (INDEPENDENT_AMBULATORY_CARE_PROVIDER_SITE_OTHER): Payer: Medicare Other

## 2014-10-24 DIAGNOSIS — G8110 Spastic hemiplegia affecting unspecified side: Secondary | ICD-10-CM

## 2014-10-28 ENCOUNTER — Ambulatory Visit (INDEPENDENT_AMBULATORY_CARE_PROVIDER_SITE_OTHER): Payer: Medicare Other | Admitting: Family Medicine

## 2014-10-28 ENCOUNTER — Other Ambulatory Visit (INDEPENDENT_AMBULATORY_CARE_PROVIDER_SITE_OTHER): Payer: Medicare Other

## 2014-10-28 ENCOUNTER — Encounter: Payer: Self-pay | Admitting: Family Medicine

## 2014-10-28 VITALS — BP 122/76 | HR 71 | Ht 71.0 in | Wt 222.0 lb

## 2014-10-28 DIAGNOSIS — G5702 Lesion of sciatic nerve, left lower limb: Secondary | ICD-10-CM

## 2014-10-28 DIAGNOSIS — M7072 Other bursitis of hip, left hip: Secondary | ICD-10-CM | POA: Insufficient documentation

## 2014-10-28 NOTE — Progress Notes (Signed)
[name] [place],  [address] Phone: [phone] Subjective:      CC: Left groin pain follo wup  [id number] [name] Male is a 63 y.o. male coming in with complaint of left groin pain.  Patient does have a past medical history significant for chronic back pain. Patient also has a past medical history significant for cerebrovascular accident, type 2 diabetes and is on chronic Plavix. Patient did have x-rays at last visit. 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 Patient also had x-rays of his left hip that shows moderate osteoarthritic changes. Patient was responding to conservative therapy for these problems as well as piriformis syndrome. Patient was approximately 50% better at last visit. Patient continued the conservative approach and states the pain though does stop him from activities. Patient has been working with a trainer significant amount of time and has noticed significant tightness. Patient states it does hurt when he sits as well as one standing for a long amount of time. States that it seems to be mostly localized in the buttocks region. Patient has been doing the piriformis exercises. Has not noticed as much improvement as he would like to see. Patient is able to ambulate somewhat better though.     Past medical history, social, surgical and family history all reviewed in electronic medical record.   Review of Systems: No headache, visual changes, nausea, vomiting, diarrhea, constipation, dizziness, abdominal pain, skin rash, fevers, chills, night sweats, weight loss, swollen lymph nodes, body aches, joint swelling, muscle aches, chest pain, shortness of breath, mood changes.   Objective Blood pressure 122/76, pulse 71, height 5\' 11"  (1.803 m), weight 222 lb (100.699 kg), SpO2 97 %.  General: No apparent distress alert and oriented x3 mood and  affect normal, dressed appropriately.  Mild tremor on left side of body.  HEENT: Pupils equal, extraocular movements intact  Respiratory: Patient's speak in full sentences and does not appear short of breath  Cardiovascular: No lower extremity edema, non tender, no erythema  Skin: Warm dry intact with no signs of infection or rash on extremities or on axial skeleton.  Abdomen: Soft nontender  Neuro: Cranial nerves II through XII are intact, neurovascularly intact in all extremities with 2+ DTRs and 2+ pulses.  Lymph: No lymphadenopathy of posterior or anterior cervical chain or axillae bilaterally.  Gait Antalgic gait with weakness on  MSK:  Non tender with full range of motion and good stability but does have weakness of the left side of his body compared to the contralateral side. Hip: Left  ROM IR: 15 Deg with mild discomfort in the groin area as well as over the piriformis, ER: 35 Deg, Flexion: 100 Deg, Extension: 80 Deg, Abduction: 35 Deg, Adduction: 35 Deg Strength IR: 4/5, ER: 4/5, Flexion: 4/5, Extension: 4/5, Abduction: 3/5, Adduction: 3/5 Mild foot drop noted on the left Pelvic alignment unremarkable to inspection and palpation. Standing hip rotation and gait without trendelenburg sign / unsteadiness. Improved tenderness over the piriformis muscle. Positive Corky Sox still present No SI joint tenderness and normal minimal SI movement..   Patient does have weakness of the left ankle as well compared to the contralateral side especially with plantar flexion. 1 beat of clonus also noted. No significant change from previous exam.  Procedure: Real-time Ultrasound Guided Injection of ischial bursa on left Device: GE Logiq E  Ultrasound guided injection is preferred based  studies that show increased duration, increased effect, greater accuracy, decreased procedural pain, increased response rate, and decreased cost with ultrasound guided versus blind injection.  Verbal informed consent  obtained.  Time-out conducted.  Noted no overlying erythema, induration, or other signs of local infection.  Skin prepped in a sterile fashion.  Local anesthesia: Topical Ethyl chloride.  With sterile technique and under real time ultrasound guidance:  With a 23-gauge 3 inch needle patient was injected with 4 mL of 0.5% Marcaine and 1 mL of: 40 mg/dL at the ischial tuberosity. Completed without difficulty  Pain immediately resolved suggesting accurate placement of the medication.  Advised to call if fevers/chills, erythema, induration, drainage, or persistent bleeding.  Images permanently stored and available for review in the ultrasound unit.  Impression: Technically successful ultrasound guided injection.   Impression and Recommendations:     This case required medical decision making of moderate complexity.

## 2014-10-28 NOTE — Patient Instructions (Addendum)
Good to see you.  Continue new exercises 3 times a week.  50% of exercise today. Then good to go.  See me again in 3 weeks.  Happy holidays!

## 2014-10-28 NOTE — Assessment & Plan Note (Signed)
Patient does have some weakness secondary to the CVA for multiple years ago on this left side. I believe that that is causing tightening of the hamstrings. Differential also includes lumbar radiculopathy as well as hip arthritis. Patient was given an injection today and tolerated the procedure well with good resolution of pain. We discussed icing regimen and patient was given new exercises specific for the hamstring tightness in the hip abductor weakness. Patient is to try to make these changes and come back and see me again in 3 weeks for further evaluation and treatment.  Spent greater than 25 minutes with patient face-to-face and had greater than 50% of counseling including as described above in assessment and plan.

## 2014-11-20 ENCOUNTER — Ambulatory Visit (INDEPENDENT_AMBULATORY_CARE_PROVIDER_SITE_OTHER): Payer: Medicare Other | Admitting: Family Medicine

## 2014-11-20 ENCOUNTER — Encounter: Payer: Self-pay | Admitting: Family Medicine

## 2014-11-20 ENCOUNTER — Other Ambulatory Visit (INDEPENDENT_AMBULATORY_CARE_PROVIDER_SITE_OTHER): Payer: Medicare Other

## 2014-11-20 VITALS — BP 118/68 | HR 89 | Ht 71.0 in | Wt 220.0 lb

## 2014-11-20 DIAGNOSIS — M25552 Pain in left hip: Secondary | ICD-10-CM

## 2014-11-20 DIAGNOSIS — M1990 Unspecified osteoarthritis, unspecified site: Secondary | ICD-10-CM

## 2014-11-20 DIAGNOSIS — M1612 Unilateral primary osteoarthritis, left hip: Secondary | ICD-10-CM | POA: Insufficient documentation

## 2014-11-20 NOTE — Assessment & Plan Note (Signed)
Patient was given an injection today with near complete resolution of pain. Patient still had tightness in his hamstring. States that the groin pain though is completely resolved. I believe the patient's underlying pain is likely secondary to the hip arthritis and less likely from the back arthritis. Patient encouraged to continue exercises specific for the hip as well as continue walking. We discussed that if patient continued to have difficulty with the amount of arthritis he has seemed may need surgical intervention. With patient having no paresis of the left side after the stroke this can complicate the formal physical therapy. With patient having the response to the injection though the diagnosis has been made. Patient will follow-up in 3-4 weeks for further evaluation.  Spent greater than 25 minutes with patient face-to-face and had greater than 50% of counseling including as described above in assessment and plan.

## 2014-11-20 NOTE — Patient Instructions (Addendum)
Good to see you Ice is your friend Tramadol at night if needed Tylenol 325mg  3 times a day can really help.  Watch your sugar an extra time a day for next 3 days. Call me if above 300. If above 400 then need to go to ER Follow up in  3-4 weeks.

## 2014-11-20 NOTE — Progress Notes (Signed)
[name] [place], [place] [address] Phone: [phone] Subjective:      CC: Left groin pain follo wup  [id number] [name] is a 63 y.o. male coming in with complaint of left groin pain.  Patient does have a past medical history significant for chronic back pain. Patient also has a past medical history significant for cerebrovascular accident, type 2 diabetes and is on chronic Plavix. Patient did have x-rays at last visit. 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 Patient also had x-rays of his left hip that shows moderate osteoarthritic changes. Patient was responding to conservative therapy for these problems as well as piriformis syndrome. Patient was approximately 50% better at last visit. Patient continued to have pain of the ischial bursa. Patient was given an injection at last exam. There was concern the patient pain is referred pain from the hip arthritis. Patient was to continue the home exercises, icing, and the over-the-counter medications we discussed. Patient states she did not make any significant improvement from previous exam. Patient states that the groin pain seems to be worse. 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 Patient has failed many other conservative therapies including prescription medications, formal physical therapy which she is continuing doing, and over-the-counter medicines. This is affecting his daily activities and can wake him up at night.     Past medical history, social, surgical and family history all reviewed in electronic medical record.   Review of Systems: No headache, visual changes, nausea, vomiting, diarrhea, constipation, dizziness, abdominal pain, skin rash, fevers, chills, night sweats, weight loss, swollen lymph nodes, body aches, joint swelling, muscle aches, chest pain, shortness of breath, mood changes.    Objective Blood pressure 118/68, pulse 89, height 5\' 11"  (1.803 m), weight 220 lb (99.791 kg), SpO2 98 %.  General: No apparent distress alert and oriented x3 mood and affect normal, dressed appropriately.  Mild tremor on left side of body.  HEENT: Pupils equal, extraocular movements intact  Respiratory: Patient's speak in full sentences and does not appear short of breath  Cardiovascular: No lower extremity edema, non tender, no erythema  Skin: Warm dry intact with no signs of infection or rash on extremities or on axial skeleton.  Abdomen: Soft nontender  Neuro: Cranial nerves II through XII are intact, neurovascularly intact in all extremities with 2+ DTRs and 2+ pulses.  Lymph: No lymphadenopathy of posterior or anterior cervical chain or axillae bilaterally.  Gait Antalgic gait with weakness on  MSK:  Non tender with full range of motion and good stability but does have weakness of the left side of his body compared to the contralateral side. Hip: Left  ROM IR: 15 Deg with with worsening groin pain  ER: 35 Deg, Flexion: 100 Deg, Extension: 80 Deg, Abduction: 35 Deg, Adduction: 35 Deg Strength IR: 4/5, ER: 4/5, Flexion: 4/5, Extension: 4/5, Abduction: 3/5, Adduction: 3/5 Mild foot drop noted on the left Pelvic alignment unremarkable to inspection and palpation. Standing hip rotation and gait without trendelenburg sign / unsteadiness. Improved tenderness over the piriformis muscle. Positive Corky Sox still present No SI joint tenderness and normal minimal SI movement..   Patient does have weakness of the left ankle as well compared to the contralateral side especially with plantar flexion. 1 beat of clonus also noted. No significant change from previous exam.  Procedure: Real-time Ultrasound Guided Injection of left intra-articular hip  Device: GE Logiq E  Ultrasound guided injection is preferred based studies that show increased duration, increased effect, greater accuracy, decreased  procedural pain, increased response rate with ultrasound guided versus blind injection.  Verbal informed consent obtained.  Time-out conducted.  Noted no overlying erythema, induration, or other signs of local infection.  Skin prepped in a sterile fashion.  Local anesthesia: Topical Ethyl chloride.  With sterile technique and under real time ultrasound guidance:  Anterior capsule visualized, needle visualized going to the head neck junction at the anterior capsule. Pictures taken. Patient did have injection of 3 cc of 1% lidocaine, 3 cc of 0.5% Marcaine, and 1 cc of Kenalog 40 mg/dL. Completed without difficulty  Pain immediately resolved suggesting accurate placement of the medication.  Advised to call if fevers/chills, erythema, induration, drainage, or persistent bleeding.  Images permanently stored and available for review in the ultrasound unit.  Impression: Technically successful ultrasound guided injection. .   Impression and Recommendations:     This case required medical decision making of moderate complexity.

## 2014-12-11 ENCOUNTER — Ambulatory Visit (INDEPENDENT_AMBULATORY_CARE_PROVIDER_SITE_OTHER): Payer: Medicare Other | Admitting: Family Medicine

## 2014-12-11 ENCOUNTER — Encounter: Payer: Self-pay | Admitting: Family Medicine

## 2014-12-11 VITALS — BP 142/82 | HR 72 | Ht 71.0 in | Wt 207.0 lb

## 2014-12-11 DIAGNOSIS — M9904 Segmental and somatic dysfunction of sacral region: Secondary | ICD-10-CM

## 2014-12-11 DIAGNOSIS — M9902 Segmental and somatic dysfunction of thoracic region: Secondary | ICD-10-CM

## 2014-12-11 DIAGNOSIS — M9903 Segmental and somatic dysfunction of lumbar region: Secondary | ICD-10-CM

## 2014-12-11 DIAGNOSIS — M1612 Unilateral primary osteoarthritis, left hip: Secondary | ICD-10-CM

## 2014-12-11 DIAGNOSIS — M1990 Unspecified osteoarthritis, unspecified site: Secondary | ICD-10-CM

## 2014-12-11 DIAGNOSIS — M999 Biomechanical lesion, unspecified: Secondary | ICD-10-CM | POA: Insufficient documentation

## 2014-12-11 NOTE — Patient Instructions (Signed)
Good to see you Ice when you need it.  Work with the Clinical research associate.  We tried manipulaiton today.  For the hip we can try another injection in 2 months if needed See me again in 3 -4 weeks.

## 2014-12-11 NOTE — Assessment & Plan Note (Signed)
Patient does have arthritis of the hip. Patient was given an injection with some improvement. Patient continues to have pain I do think that a hip replacement could be beneficial. Patient though wants to avoid that at all. Patient did respond somewhat to osteopathic manipular relation today. We will try this conservative therapy measure. Patient continue to work with Product/process development scientist and come back again in 3 weeks for further evaluation and treatment.

## 2014-12-11 NOTE — Progress Notes (Signed)
[name] [place], [place] [address] Phone: [phone] Subjective:      CC: Left groin pain follo wup  [id number] [name] is a 64 y.o. male coming in with complaint of left groin pain.  Patient does have a past medical history significant for chronic back pain. Patient also has a past medical history significant for cerebrovascular accident, type 2 diabetes and is on chronic Plavix. Patient did have x-rays at last visit. 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 Patient also had x-rays of his left hip that shows moderate osteoarthritic changes. Patient was responding to conservative therapy for these problems as well as piriformis syndrome. Patient was approximately 50% better at last visit. Patient has had an ischial bursitis injection that did not make any significant improvement. Patient did have a hip injection last visit. Patient states that this did help out his hip for some time. Continuing to have low back discomfort. Patient is wondering if any type of manipulation to be helpful. Still able to do daily activities. Has not notice any significant increase in the weakness from the left side.     Past medical history, social, surgical and family history all reviewed in electronic medical record.   Review of Systems: No headache, visual changes, nausea, vomiting, diarrhea, constipation, dizziness, abdominal pain, skin rash, fevers, chills, night sweats, weight loss, swollen lymph nodes, body aches, joint swelling, muscle aches, chest pain, shortness of breath, mood changes.   Objective Blood pressure 142/82, weight 207 lb (93.895 kg).  General: No apparent distress alert and oriented x3 mood and affect normal, dressed appropriately.  Mild tremor on left side of body.  HEENT: Pupils equal, extraocular movements intact  Respiratory: Patient's speak in full  sentences and does not appear short of breath  Cardiovascular: No lower extremity edema, non tender, no erythema  Skin: Warm dry intact with no signs of infection or rash on extremities or on axial skeleton.  Abdomen: Soft nontender  Neuro: Cranial nerves II through XII are intact, neurovascularly intact in all extremities with 2+ DTRs and 2+ pulses.  Lymph: No lymphadenopathy of posterior or anterior cervical chain or axillae bilaterally.  Gait Antalgic gait with weakness on left with spasticity MSK:  Non tender with full range of motion and good stability but does have weakness of the left side of his body compared to the contralateral side. Hip: Left  ROM IR: 15 Deg with with worsening groin pain  ER: 35 Deg, Flexion: 100 Deg, Extension: 80 Deg, Abduction: 35 Deg, Adduction: 35 Deg Strength IR: 4/5, ER: 4/5, Flexion: 4/5, Extension: 4/5, Abduction: 3/5, Adduction: 3/5 Mild foot drop noted on the left Pelvic alignment unremarkable to inspection and palpation. Standing hip rotation and gait without trendelenburg sign / unsteadiness. Improved tenderness over the piriformis muscle. Positive Corky Sox still present No SI joint tenderness and normal minimal SI movement..   Patient does have weakness of the left ankle as well compared to the contralateral side especially with plantar flexion. 1 beat of clonus also noted. No significant change from previous exam.  OMT Physical Exam   Standing flexion right  Seated Flexion right  Cervical  C2 F RS right  Thoracic T5 E RS left  Lumbar L2 F RS right  Sacrum Left on left     Impression and Recommendations:     This case required medical decision making of moderate  complexity.

## 2014-12-11 NOTE — Assessment & Plan Note (Signed)
Decision today to treat with OMT was based on Physical Exam  After verbal consent patient was treated with HVLA, ME techniques in thoracic, lumbar and sacral areas  Patient tolerated the procedure well with improvement in symptoms  Patient given exercises, stretches and lifestyle modifications  See medications in patient instructions if given  Patient will follow up in 3 weeks

## 2015-01-07 ENCOUNTER — Other Ambulatory Visit: Payer: Self-pay | Admitting: Family Medicine

## 2015-01-07 ENCOUNTER — Other Ambulatory Visit: Payer: Self-pay | Admitting: Internal Medicine

## 2015-01-07 NOTE — Telephone Encounter (Signed)
Refill done.  

## 2015-01-14 ENCOUNTER — Ambulatory Visit (INDEPENDENT_AMBULATORY_CARE_PROVIDER_SITE_OTHER): Payer: Medicare Other | Admitting: Family Medicine

## 2015-01-14 ENCOUNTER — Encounter: Payer: Self-pay | Admitting: Family Medicine

## 2015-01-14 VITALS — BP 138/80 | HR 82 | Ht 71.0 in | Wt 209.0 lb

## 2015-01-14 DIAGNOSIS — M999 Biomechanical lesion, unspecified: Secondary | ICD-10-CM

## 2015-01-14 DIAGNOSIS — M9902 Segmental and somatic dysfunction of thoracic region: Secondary | ICD-10-CM

## 2015-01-14 DIAGNOSIS — M9903 Segmental and somatic dysfunction of lumbar region: Secondary | ICD-10-CM

## 2015-01-14 DIAGNOSIS — M9904 Segmental and somatic dysfunction of sacral region: Secondary | ICD-10-CM

## 2015-01-14 DIAGNOSIS — G5702 Lesion of sciatic nerve, left lower limb: Secondary | ICD-10-CM

## 2015-01-14 NOTE — Progress Notes (Signed)
Pre visit review using our clinic review tool, if applicable. No additional management support is needed unless otherwise documented below in the visit note. 

## 2015-01-14 NOTE — Patient Instructions (Addendum)
Good to see you You are doing great Continue the gym 3 times a week.  Continue the stretches as well I am glad you are feeling better.  See me again in about 4 weeks.

## 2015-01-14 NOTE — Assessment & Plan Note (Signed)
Decision today to treat with OMT was based on Physical Exam  After verbal consent patient was treated with HVLA, ME techniques in thoracic, lumbar and sacral areas  Patient tolerated the procedure well with improvement in symptoms  Patient given exercises, stretches and lifestyle modifications  See medications in patient instructions if given  Patient will follow up in 3 weeks

## 2015-01-14 NOTE — Assessment & Plan Note (Signed)
Patient is responding fairly well to conservative therapy. Encourage patient to continue the exercises, icing protocol as well as continuing to go to the gym on a regular basis. Patient did respond well to osteopathic manipulation again today. Patient does have the left-sided edema paresis from the stroke which also is challenging to control. Patient then will come back and see me again in 3-4 weeks for further evaluation and treatment.

## 2015-01-14 NOTE — Progress Notes (Signed)
  [facility] [place], [place] [address] Phone: [phone] Subjective:      CC: Left groin pain follo wup  [id number] [name] is a 64 y.o. male coming in with complaint of left groin pain.  Patient does have a past medical history significant for chronic back pain. Patient also has a past medical history significant for cerebrovascular accident, type 2 diabetes and is on chronic Plavix. Patient did have x-rays at last visit. 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 Patient also had x-rays of his left hip that shows moderate osteoarthritic changes. Patient was responding to conservative therapy for these problems as well as piriformis syndrome. Patient also did respond well to osteopathic manipulation at last follow-up. Patient continues to do the exercises fairly regularly. Patient states that he still does have a dull aching pain. Patient was to avoid any significant intervention such as surgical and further workup has been done on his back or hip at this time. Patient states that he can still do the activities of daily living fairly well.     Past medical history, social, surgical and family history all reviewed in electronic medical record.   Review of Systems: No headache, visual changes, nausea, vomiting, diarrhea, constipation, dizziness, abdominal pain, skin rash, fevers, chills, night sweats, weight loss, swollen lymph nodes, body aches, joint swelling, muscle aches, chest pain, shortness of breath, mood changes.   Objective Blood pressure 138/80, pulse 82, height 5\' 11"  (1.803 m), weight 209 lb (94.802 kg), SpO2 97 %.  General: No apparent distress alert and oriented x3 mood and affect normal, dressed appropriately.  Mild tremor on left side of body.  HEENT: Pupils equal, extraocular movements intact  Respiratory: Patient's speak in full sentences and  does not appear short of breath  Cardiovascular: No lower extremity edema, non tender, no erythema  Skin: Warm dry intact with no signs of infection or rash on extremities or on axial skeleton.  Abdomen: Soft nontender  Neuro: Cranial nerves II through XII are intact, neurovascularly intact in all extremities with 2+ DTRs and 2+ pulses.  Lymph: No lymphadenopathy of posterior or anterior cervical chain or axillae bilaterally.  Gait Antalgic gait with weakness on left with spasticity MSK:  Non tender with full range of motion and good stability but does have weakness of the left side of his body compared to the contralateral side. Hip: Left  ROM IR: 15 Deg with no pain which is an improvement ER: 35 Deg, Flexion: 100 Deg, Extension: 80 Deg, Abduction: 35 Deg, Adduction: 35 Deg Strength IR: 4/5, ER: 4/5, Flexion: 4/5, Extension: 4/5, Abduction: 3/5, Adduction: 3/5 Mild foot drop noted on the left Pelvic alignment unremarkable to inspection and palpation. Standing hip rotation and gait without trendelenburg sign / unsteadiness. Improved tenderness over the piriformis muscle. Positive Corky Sox still present No SI joint tenderness and normal minimal SI movement..   Patient does have weakness of the left ankle as well compared to the contralateral side especially with plantar flexion. 1 beat of clonus also noted. Decreased pain from previous exam with internal rotation.  OMT Physical Exam   Cervical  Neutral  Thoracic T5 E RS left  Lumbar L2 F RS right  Sacrum Left on left     Impression and Recommendations:     This case required medical decision making of moderate complexity.

## 2015-01-31 ENCOUNTER — Telehealth: Payer: Self-pay

## 2015-02-03 NOTE — Telephone Encounter (Signed)
Status of flu unknown; answering machine full and could not leave a message

## 2015-02-08 ENCOUNTER — Other Ambulatory Visit: Payer: Self-pay | Admitting: Family Medicine

## 2015-02-10 ENCOUNTER — Encounter: Payer: Self-pay | Admitting: Family Medicine

## 2015-02-10 ENCOUNTER — Ambulatory Visit (INDEPENDENT_AMBULATORY_CARE_PROVIDER_SITE_OTHER): Payer: Medicare Other | Admitting: Family Medicine

## 2015-02-10 VITALS — BP 124/84 | Ht 71.0 in | Wt 212.0 lb

## 2015-02-10 DIAGNOSIS — M9902 Segmental and somatic dysfunction of thoracic region: Secondary | ICD-10-CM | POA: Diagnosis not present

## 2015-02-10 DIAGNOSIS — M629 Disorder of muscle, unspecified: Secondary | ICD-10-CM

## 2015-02-10 DIAGNOSIS — M9904 Segmental and somatic dysfunction of sacral region: Secondary | ICD-10-CM

## 2015-02-10 DIAGNOSIS — M7072 Other bursitis of hip, left hip: Secondary | ICD-10-CM

## 2015-02-10 DIAGNOSIS — M9903 Segmental and somatic dysfunction of lumbar region: Secondary | ICD-10-CM | POA: Diagnosis not present

## 2015-02-10 DIAGNOSIS — M6289 Other specified disorders of muscle: Secondary | ICD-10-CM

## 2015-02-10 DIAGNOSIS — M999 Biomechanical lesion, unspecified: Secondary | ICD-10-CM

## 2015-02-10 NOTE — Assessment & Plan Note (Signed)
We'll consider repeat injection again if patient continues to have discomfort.

## 2015-02-10 NOTE — Assessment & Plan Note (Signed)
Decision today to treat with OMT was based on Physical Exam  After verbal consent patient was treated with HVLA, ME techniques in thoracic, lumbar and sacral areas  Patient tolerated the procedure well with improvement in symptoms  Patient given exercises, stretches and lifestyle modifications  See medications in patient instructions if given  Patient will follow up in 3-4 weeks  

## 2015-02-10 NOTE — Progress Notes (Signed)
  [facility] [place], [place] [address] Phone: [phone] Subjective:    CC: Left groin pain follow up  [id number] [name] is a 64 y.o. male coming in with complaint of left groin pain.  Patient does have a past medical history significant for chronic back pain. Patient also has a past medical history significant for cerebrovascular accident, type 2 diabetes and is on chronic Plavix. Patient did have x-rays at last visit. 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 Patient also had x-rays of his left hip that shows moderate osteoarthritic changes. Patient was responding to conservative therapy for these problems as well as piriformis syndrome. Patient also did respond well to osteopathic manipulation at last follow-up. Patient continues to do more more activity and has noticed that he is having increasing strength as well as feeling into his left lower extremity. Patient states that he feels that this is all helping significantly.    Past medical history, social, surgical and family history all reviewed in electronic medical record.   Review of Systems: No headache, visual changes, nausea, vomiting, diarrhea, constipation, dizziness, abdominal pain, skin rash, fevers, chills, night sweats, weight loss, swollen lymph nodes, body aches, joint swelling, muscle aches, chest pain, shortness of breath, mood changes.   Objective Blood pressure 124/84, height 5\' 11"  (1.803 m), weight 212 lb (96.163 kg), SpO2 97 %.  General: No apparent distress alert and oriented x3 mood and affect normal, dressed appropriately.  Mild tremor on left side of body.  HEENT: Pupils equal, extraocular movements intact  Respiratory: Patient's speak in full sentences and does not appear short of breath  Cardiovascular: No lower extremity edema, non tender, no erythema  Skin: Warm dry intact  with no signs of infection or rash on extremities or on axial skeleton.  Abdomen: Soft nontender  Neuro: Cranial nerves II through XII are intact, neurovascularly intact in all extremities with 2+ DTRs and 2+ pulses.  Lymph: No lymphadenopathy of posterior or anterior cervical chain or axillae bilaterally.  Gait Antalgic gait with weakness on left with spasticity actually somewhat better than previous exam. MSK:  Non tender with full range of motion and good stability but does have weakness of the left side of his body compared to the contralateral side. Hip: Left  ROM IR: 15 Deg with no pain  ER: 35 Deg, Flexion: 100 Deg, Extension: 80 Deg, Abduction: 35 Deg, Adduction: 35 Deg Strength IR: 4/5, ER: 4/5, Flexion: 4/5, Extension: 4/5, Abduction: 3/5, Adduction: 3/5 Mild foot drop noted on the left Pelvic alignment unremarkable to inspection and palpation. Standing hip rotation and gait without trendelenburg sign / unsteadiness. Improved tenderness over the piriformis muscle. Positive Corky Sox still present No SI joint tenderness and normal minimal SI movement..   Patient does have weakness of the left ankle as well compared to the contralateral side especially with plantar flexion. 1 beat of clonus still noted  OMT Physical Exam   Cervical  C3 flexed rotated and side bent left  Thoracic T5 E RS left  Lumbar L2 F RS right  Sacrum Left on left     Impression and Recommendations:     This case required medical decision making of moderate complexity.

## 2015-02-10 NOTE — Telephone Encounter (Signed)
Refill done.  

## 2015-02-10 NOTE — Progress Notes (Signed)
Pre visit review using our clinic review tool, if applicable. No additional management support is needed unless otherwise documented below in the visit note. 

## 2015-02-10 NOTE — Assessment & Plan Note (Signed)
Multifactorial in nature. Discussed icing and home exercises. Patient does have some tightness of the ischial area and the patient continues to have discomfort we may went to consider an injection. Patient though has noticed that he is increasing some of his strength. Discussed continuing the vitamins as well. Patient and will come back and see me again in 4-6 weeks for further evaluation and treatment.

## 2015-02-10 NOTE — Patient Instructions (Addendum)
Good to see you Stay active, you are doing great Your walking pretty good Continue vitamin D at 2000 IU daily Try a compression sleeve to the thigh See me again in 3 weeks and if pain is still in the butt we will do another injection.

## 2015-03-04 ENCOUNTER — Encounter: Payer: Self-pay | Admitting: Family Medicine

## 2015-03-04 ENCOUNTER — Ambulatory Visit (INDEPENDENT_AMBULATORY_CARE_PROVIDER_SITE_OTHER): Payer: Medicare Other | Admitting: Family Medicine

## 2015-03-04 VITALS — BP 138/86 | HR 68 | Ht 71.0 in | Wt 212.0 lb

## 2015-03-04 DIAGNOSIS — M9904 Segmental and somatic dysfunction of sacral region: Secondary | ICD-10-CM | POA: Diagnosis not present

## 2015-03-04 DIAGNOSIS — M9902 Segmental and somatic dysfunction of thoracic region: Secondary | ICD-10-CM

## 2015-03-04 DIAGNOSIS — G5702 Lesion of sciatic nerve, left lower limb: Secondary | ICD-10-CM | POA: Diagnosis not present

## 2015-03-04 DIAGNOSIS — M999 Biomechanical lesion, unspecified: Secondary | ICD-10-CM

## 2015-03-04 DIAGNOSIS — M9903 Segmental and somatic dysfunction of lumbar region: Secondary | ICD-10-CM | POA: Diagnosis not present

## 2015-03-04 NOTE — Patient Instructions (Addendum)
I am impressed Keep it up with the trainer Take back the medicines due to the allergy.  Ice is your friend Stand on wall with heels, butt shoulder and head touching for goal of 5 minutes daily.  See me again in 4-5 weeks.

## 2015-03-04 NOTE — Assessment & Plan Note (Signed)
Decision today to treat with OMT was based on Physical Exam  After verbal consent patient was treated with HVLA, ME techniques in thoracic, lumbar and sacral areas  Patient tolerated the procedure well with improvement in symptoms  Patient given exercises, stretches and lifestyle modifications  See medications in patient instructions if given  Patient will follow up in 4-5 weeks

## 2015-03-04 NOTE — Assessment & Plan Note (Signed)
Patient overall is doing significantly better. We will encourage patient to continue to stay active. Patient is having more strength on the left side of his body that I think will actually be more helpful. Patient will continue to work with his trainer. Patient will come back and see me again in 4-5 weeks. Patient was given new postural exercises as well today.

## 2015-03-04 NOTE — Progress Notes (Signed)
  [facility] [place], [place] [address] Phone: [phone] Subjective:    CC: Left groin pain follow up  [id number] [name] is a 64 y.o. male coming in with complaint of left groin pain.  Patient does have a past medical history significant for chronic back pain. Patient also has a past medical history significant for cerebrovascular accident, type 2 diabetes and is on chronic Plavix. Patient did have x-rays at last visit. 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 Patient also had x-rays of his left hip that shows moderate osteoarthritic changes. Patient was responding to conservative therapy for these problems as well as piriformis syndrome. Patient also did respond well to osteopathic manipulation at last follow-up. Patient continues to do more more activity and has noticed that he is having increasing strength as well as feeling into his left lower extremity. Patient has been working with Product/process development scientist and is feeling significantly better. Patient has noticed that his left side does not seem to be as a week.    Past medical history, social, surgical and family history all reviewed in electronic medical record.   Review of Systems: No headache, visual changes, nausea, vomiting, diarrhea, constipation, dizziness, abdominal pain, skin rash, fevers, chills, night sweats, weight loss, swollen lymph nodes, body aches, joint swelling, muscle aches, chest pain, shortness of breath, mood changes.   Objective Blood pressure 138/86, pulse 68, height 5\' 11"  (1.803 m), weight 212 lb (96.163 kg), SpO2 99 %.  General: No apparent distress alert and oriented x3 mood and affect normal, dressed appropriately.  Less tremor on the left side of body HEENT: Pupils equal, extraocular movements intact  Respiratory: Patient's speak in full sentences and does not appear short of  breath  Cardiovascular: No lower extremity edema, non tender, no erythema  Skin: Warm dry intact with no signs of infection or rash on extremities or on axial skeleton.  Abdomen: Soft nontender  Neuro: Cranial nerves II through XII are intact, neurovascularly intact in all extremities with 2+ DTRs and 2+ pulses.  Lymph: No lymphadenopathy of posterior or anterior cervical chain or axillae bilaterally.  Gait Antalgic gait with weakness on left with spasticity actually somewhat better than previous exam. MSK:  Non tender with full range of motion and good stability but does have weakness of the left side of his body compared to the contralateral side. Hip: Left  ROM IR:  20 Deg with no pain  ER: 35 Deg, Flexion: 100 Deg, Extnsion: 80 Deg, Abduction: 35 Deg, Adduction: 35 Deg Strength IR: 4/5, ER: 4/5, Flexion: 4/5, Extension: 4/5, Abduction: 3/5, Adduction: 3/5 Mild foot drop noted on the left Pelvic alignment unremarkable to inspection and palpation. Standing hip rotation and gait without trendelenburg sign / unsteadiness. Improved tenderness over the piriformis muscle. Mild positive Faber on left No SI joint tenderness and normal minimal SI movement..   Patient does have weakness of the left ankle as well compared to the contralateral side especially with plantar flexion. 1 beat of clonus still noted  OMT Physical Exam   Cervical  C3 flexed rotated and side bent left  Thoracic T5 E RS left  Lumbar L2 F RS right  Sacrum Left on left     Impression and Recommendations:     This case required medical decision making of moderate complexity.

## 2015-03-04 NOTE — Progress Notes (Signed)
Pre visit review using our clinic review tool, if applicable. No additional management support is needed unless otherwise documented below in the visit note. 

## 2015-04-01 ENCOUNTER — Ambulatory Visit (INDEPENDENT_AMBULATORY_CARE_PROVIDER_SITE_OTHER): Payer: Medicare Other | Admitting: Family Medicine

## 2015-04-01 ENCOUNTER — Encounter: Payer: Self-pay | Admitting: Family Medicine

## 2015-04-01 VITALS — BP 136/82 | HR 65 | Ht 71.0 in | Wt 212.0 lb

## 2015-04-01 DIAGNOSIS — M9902 Segmental and somatic dysfunction of thoracic region: Secondary | ICD-10-CM | POA: Diagnosis not present

## 2015-04-01 DIAGNOSIS — M549 Dorsalgia, unspecified: Secondary | ICD-10-CM

## 2015-04-01 DIAGNOSIS — M9904 Segmental and somatic dysfunction of sacral region: Secondary | ICD-10-CM | POA: Diagnosis not present

## 2015-04-01 DIAGNOSIS — G8929 Other chronic pain: Secondary | ICD-10-CM

## 2015-04-01 DIAGNOSIS — M999 Biomechanical lesion, unspecified: Secondary | ICD-10-CM

## 2015-04-01 DIAGNOSIS — M9903 Segmental and somatic dysfunction of lumbar region: Secondary | ICD-10-CM | POA: Diagnosis not present

## 2015-04-01 NOTE — Assessment & Plan Note (Signed)
Decision today to treat with OMT was based on Physical Exam  After verbal consent patient was treated with HVLA, ME techniques in thoracic, lumbar and sacral areas  Patient tolerated the procedure well with improvement in symptoms  Patient given exercises, stretches and lifestyle modifications  See medications in patient instructions if given  Patient will follow up in 4-5 weeks

## 2015-04-01 NOTE — Progress Notes (Signed)
Pre visit review using our clinic review tool, if applicable. No additional management support is needed unless otherwise documented below in the visit note. 

## 2015-04-01 NOTE — Progress Notes (Signed)
  [facility] [place] [address] [place], [place] [address] Phone: [phone] Subjective:    CC: Left groin pain follow up  [id number] [name] is a 64 y.o. male coming in with complaint of left groin pain.  Patient does have a past medical history significant for chronic back pain. Patient also has a past medical history significant for cerebrovascular accident, type 2 diabetes and is on chronic Plavix. Patient did have x-rays at last visit. 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 Patient also had x-rays of his left hip that shows moderate osteoarthritic changes. Patient was responding to conservative therapy for these problems as well as piriformis syndrome. Patient also did respond well to osteopathic manipulation at last follow-up. Patient continues to do relatively well. Patient has not been as active for the last 2 weeks though.    Past medical history, social, surgical and family history all reviewed in electronic medical record.   Review of Systems: No headache, visual changes, nausea, vomiting, diarrhea, constipation, dizziness, abdominal pain, skin rash, fevers, chills, night sweats, weight loss, swollen lymph nodes, body aches, joint swelling, muscle aches, chest pain, shortness of breath, mood changes.   Objective Blood pressure 136/82, pulse 65, height 5\' 11"  (1.803 m), weight 212 lb (96.163 kg), SpO2 99 %.  General: No apparent distress alert and oriented x3 mood and affect normal, dressed appropriately.  Less tremor on the left side of body HEENT: Pupils equal, extraocular movements intact  Respiratory: Patient's speak in full sentences and does not appear short of breath  Cardiovascular: No lower extremity edema, non tender, no erythema  Skin: Warm dry intact with no signs of infection or rash on extremities or on axial skeleton.  Abdomen: Soft nontender  Neuro:  Cranial nerves II through XII are intact, neurovascularly intact in all extremities with 2+ DTRs and 2+ pulses.  Lymph: No lymphadenopathy of posterior or anterior cervical chain or axillae bilaterally.  Gait Antalgic gait with weakness on left with spasticity actually somewhat better than previous exam. MSK:  Non tender with full range of motion and good stability but does have weakness of the left side of his body compared to the contralateral side. Hip: Left  ROM IR:  20 Deg with no pain  ER: 35 Deg, Flexion: 100 Deg, Extnsion: 70 Deg, Abduction: 35 Deg, Adduction: 35 Deg Strength IR: 4/5, ER: 4/5, Flexion: 4/5, Extension: 4/5, Abduction: 4/5, Adduction: 3/5 Mild foot drop noted on the left Pelvic alignment unremarkable to inspection and palpation. Standing hip rotation and gait without trendelenburg sign / unsteadiness. Improved tenderness over the piriformis muscle. Mild positive Faber on left No SI joint tenderness and normal minimal SI movement..   Patient does have weakness of the left ankle as well compared to the contralateral side especially with plantar flexion. 1 beat of clonus still noted but still improving  OMT Physical Exam   Cervical  C3 flexed rotated and side bent left  Thoracic T5 E RS left  Lumbar L2 F RS right  Sacrum Left on left     Impression and Recommendations:     This case required medical decision making of moderate complexity.

## 2015-04-01 NOTE — Patient Instructions (Signed)
Good to see You  The hip girdle has been a little tight so get back on to the routine.  Ice is still your friend Focus on stretching a little of upper back as well after lifting.  You are making amazing strides Keep it up See me in 4-5 weeks.

## 2015-04-01 NOTE — Assessment & Plan Note (Signed)
Patient's chronic back pain and do think is some multifactorial. Patient though has been responding fairly well to osteopathic manipulation. I do think that this will be beneficial. Patient is continuing to work very hard with his trainer and has made strides. The weakness patient has had on the left side has been improving slowly. Encourage patient to continue to do what he is doing on a regular basis though. Patient and will see me again in 4-5 weeks for further evaluation and treatment. Encourage him to monitor his weight as well.

## 2015-04-15 ENCOUNTER — Other Ambulatory Visit: Payer: Self-pay | Admitting: Internal Medicine

## 2015-05-02 ENCOUNTER — Ambulatory Visit: Payer: Medicare Other | Admitting: Family Medicine

## 2015-05-02 DIAGNOSIS — Z0289 Encounter for other administrative examinations: Secondary | ICD-10-CM

## 2015-05-19 ENCOUNTER — Other Ambulatory Visit: Payer: Self-pay

## 2015-07-01 ENCOUNTER — Other Ambulatory Visit: Payer: Self-pay | Admitting: Family Medicine

## 2015-07-01 NOTE — Telephone Encounter (Signed)
Refill done.  

## 2015-08-07 ENCOUNTER — Ambulatory Visit (INDEPENDENT_AMBULATORY_CARE_PROVIDER_SITE_OTHER)
Admission: RE | Admit: 2015-08-07 | Discharge: 2015-08-07 | Disposition: A | Discharge status: Home / Self Care | Payer: Medicare Other | Source: Ambulatory Visit | Attending: Internal Medicine | Admitting: Internal Medicine

## 2015-08-07 ENCOUNTER — Other Ambulatory Visit (INDEPENDENT_AMBULATORY_CARE_PROVIDER_SITE_OTHER): Payer: Medicare Other

## 2015-08-07 ENCOUNTER — Encounter: Payer: Self-pay | Admitting: Internal Medicine

## 2015-08-07 ENCOUNTER — Ambulatory Visit (INDEPENDENT_AMBULATORY_CARE_PROVIDER_SITE_OTHER): Payer: Medicare Other | Admitting: Internal Medicine

## 2015-08-07 VITALS — BP 120/78 | HR 64 | Temp 98.3°F | Ht 71.0 in | Wt 214.5 lb

## 2015-08-07 DIAGNOSIS — E785 Hyperlipidemia, unspecified: Secondary | ICD-10-CM

## 2015-08-07 DIAGNOSIS — R0789 Other chest pain: Secondary | ICD-10-CM

## 2015-08-07 DIAGNOSIS — I10 Essential (primary) hypertension: Secondary | ICD-10-CM

## 2015-08-07 DIAGNOSIS — E1140 Type 2 diabetes mellitus with diabetic neuropathy, unspecified: Secondary | ICD-10-CM

## 2015-08-07 DIAGNOSIS — E1165 Type 2 diabetes mellitus with hyperglycemia: Secondary | ICD-10-CM | POA: Diagnosis not present

## 2015-08-07 DIAGNOSIS — IMO0002 Reserved for concepts with insufficient information to code with codable children: Secondary | ICD-10-CM

## 2015-08-07 LAB — LIPID PANEL
Cholesterol: 154 mg/dL (ref 0–200)
HDL: 45.6 mg/dL (ref >39.00)
LDL Cholesterol: 77 mg/dL (ref 0–99)
NonHDL: 108.38
Total CHOL/HDL Ratio: 3
Triglycerides: 157 mg/dL — ABNORMAL HIGH (ref 0.0–149.0)
VLDL: 31.4 mg/dL (ref 0.0–40.0)

## 2015-08-07 LAB — BASIC METABOLIC PANEL
BUN: 22 mg/dL (ref 6–23)
CO2: 33 mEq/L — ABNORMAL HIGH (ref 19–32)
Calcium: 9.9 mg/dL (ref 8.4–10.5)
Chloride: 102 mEq/L (ref 96–112)
Creatinine, Ser: 1.42 mg/dL (ref 0.40–1.50)
GFR: 64.2 mL/min (ref >60.00)
Glucose, Bld: 120 mg/dL — ABNORMAL HIGH (ref 70–99)
Potassium: 4 mEq/L (ref 3.5–5.1)
Sodium: 140 mEq/L (ref 135–145)

## 2015-08-07 LAB — HEPATIC FUNCTION PANEL
ALT: 16 U/L (ref 0–53)
AST: 18 U/L (ref 0–37)
Albumin: 4.2 g/dL (ref 3.5–5.2)
Alkaline Phosphatase: 65 U/L (ref 39–117)
Bilirubin, Direct: 0.2 mg/dL (ref 0.0–0.3)
Total Bilirubin: 0.8 mg/dL (ref 0.2–1.2)
Total Protein: 8.2 g/dL (ref 6.0–8.3)

## 2015-08-07 LAB — CBC WITH DIFFERENTIAL/PLATELET
Basophils Absolute: 0 10*3/uL (ref 0.0–0.1)
Basophils Relative: 0.2 % (ref 0.0–3.0)
Eosinophils Absolute: 0.2 10*3/uL (ref 0.0–0.7)
Eosinophils Relative: 2.7 % (ref 0.0–5.0)
HCT: 40 % (ref 39.0–52.0)
Hemoglobin: 13.5 g/dL (ref 13.0–17.0)
Lymphocytes Relative: 39.8 % (ref 12.0–46.0)
Lymphs Abs: 2.8 10*3/uL (ref 0.7–4.0)
MCHC: 33.8 g/dL (ref 30.0–36.0)
MCV: 90.7 fl (ref 78.0–100.0)
Monocytes Absolute: 0.8 10*3/uL (ref 0.1–1.0)
Monocytes Relative: 11.4 % (ref 3.0–12.0)
Neutro Abs: 3.3 10*3/uL (ref 1.4–7.7)
Neutrophils Relative %: 45.9 % (ref 43.0–77.0)
Platelets: 230 10*3/uL (ref 150.0–400.0)
RBC: 4.41 Mil/uL (ref 4.22–5.81)
RDW: 13.1 % (ref 11.5–15.5)
WBC: 7.1 10*3/uL (ref 4.0–10.5)

## 2015-08-07 LAB — HEMOGLOBIN A1C: Hgb A1c MFr Bld: 7.3 % — ABNORMAL HIGH (ref 4.6–6.5)

## 2015-08-07 LAB — MICROALBUMIN / CREATININE URINE RATIO
Creatinine,U: 297.7 mg/dL
Microalb Creat Ratio: 3.2 mg/g (ref 0.0–30.0)
Microalb, Ur: 9.6 mg/dL — ABNORMAL HIGH (ref 0.0–1.9)

## 2015-08-07 MED ORDER — CLOPIDOGREL BISULFATE 75 MG PO TABS: 75.0000 mg | ORAL_TABLET | Freq: Every day | ORAL | Status: DC

## 2015-08-07 MED ORDER — LOSARTAN POTASSIUM 100 MG PO TABS: 100.0000 mg | ORAL_TABLET | Freq: Every day | ORAL | Status: DC

## 2015-08-07 MED ORDER — PRAVASTATIN SODIUM 20 MG PO TABS: 20.0000 mg | ORAL_TABLET | Freq: Every day | ORAL | Status: DC

## 2015-08-07 NOTE — Progress Notes (Signed)
Pre visit review using our clinic review tool, if applicable. No additional management support is needed unless otherwise documented below in the visit note. 

## 2015-08-07 NOTE — Progress Notes (Signed)
Subjective:    Patient ID: [name], male    DOB: Nov 17, 1951, 64 y.o.   MRN: [id number]  HPI  Patient here for followup - follows routinely at [facility] for maintenance issues Reports BP variation, 160/90s, prev 130s/80s - in past month cbgs <140  Past Medical History  Diagnosis Date  . ALLERGIC RHINITIS   . CAD, NATIVE VESSEL     BMS to OM1 2001, DES to BMS 2005  . DIAB W/UNSPEC COMP TYPE II/UNSPEC TYPE UNCNTRL   . ERECTILE DYSFUNCTION   . HYPERLIPIDEMIA-MIXED   . MORTON'S NEUROMA, RIGHT   . SHOULDER PAIN, RIGHT   . HYPERTENSION, BENIGN   . Peripheral neuropathy   . Chronic back pain     herniated disc  . GERD   . Constipation     takes Carafate four times day  . Seasonal allergies     takes Allegra and Benadryl daily prn;uses Flonase daily  . TIA on medication 02/2012  . 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 Negative for fatigue. Fever: ?  HENT: Positive for ear pain (B "cold throbbing", on off x 1 mo) and sore throat.   Respiratory: Positive for chest tightness. Negative for cough and shortness of breath.   Cardiovascular: Negative for palpitations and leg swelling.  Psychiatric/Behavioral: Negative for sleep disturbance, dysphoric mood and decreased concentration. The patient is not nervous/anxious.        Objective:    Physical Exam  Constitutional: He is oriented to person, place, and time. He appears well-developed and well-nourished. No distress.  HENT:  Right Ear: External ear normal.  Left Ear: External ear normal.  Nose: Nose normal.  Mouth/Throat: Oropharynx is clear and moist.  Eyes: Conjunctivae are normal. Pupils are equal, round, and reactive to light.  Neck: Normal range of motion. Neck supple.  Cardiovascular: Normal rate, regular rhythm and normal heart sounds.   No murmur heard. Pulmonary/Chest: Effort normal and breath sounds normal.  No respiratory distress. He exhibits no tenderness.  Lymphadenopathy:    He has no cervical adenopathy.  Neurological: He is alert and oriented to person, place, and time. No cranial nerve deficit. Coordination normal.  Mild L HP, arm and leg - chronic since 2013  Skin: Skin is warm and dry. No rash noted.  Psychiatric: He has a normal mood and affect. His behavior is normal. Judgment and thought content normal.    BP 120/78 mmHg  Pulse 64  Temp(Src) 98.3 F (36.8 C) (Oral)  Ht 5\' 11"  (1.803 m)  Wt 214 lb 8 oz (97.297 kg)  BMI 29.93 kg/m2  SpO2 98% Wt Readings from Last 3 Encounters:  08/07/15 214 lb 8 oz (97.297 kg)  04/01/15 212 lb (96.163 kg)  03/04/15 212 lb (96.163 kg)    Lab Results  Component Value Date   WBC 6.1 10/10/2012   HGB 12.4* 10/10/2012   HCT 35.2* 10/10/2012   PLT 200 10/10/2012   GLUCOSE 187* 09/09/2014   CHOL 125 09/09/2014   TRIG 93.0 09/09/2014   HDL 37.10* 09/09/2014   LDLDIRECT 112.2 02/25/2010   LDLCALC 69 09/09/2014   ALT 42 10/17/2013   AST 30 10/17/2013   NA 138 09/09/2014   K 3.7 09/09/2014   CL 101 09/09/2014   CREATININE 1.4 09/09/2014   BUN 15 09/09/2014   CO2 32 09/09/2014   PSA 0.16 04/13/2010   INR  1.06 08/14/2012   HGBA1C 7.9* 09/09/2014   MICROALBUR 14.5* 09/09/2014    Dg Lumbar Spine Complete  09/16/2014   CLINICAL DATA:  Chronic low back pain with left-sided radicular symptoms  EXAM: LUMBAR SPINE - COMPLETE 4+ VIEW  COMPARISON:  None.  FINDINGS: Frontal, lateral, spot lumbosacral lateral, and bilateral oblique views were obtained. The there are 5 non-rib-bearing lumbar type vertebral bodies. There is no fracture or spondylolisthesis. Disc spaces appear intact. There is mild facet osteoarthritic change at L4-5 and L5-S1 on the right and at L5-S1 on the left.  IMPRESSION: Areas of osteoarthritic change.  No fracture or spondylolisthesis.   Electronically Signed   By: Lowella Grip M.D.   On: 09/16/2014 09:00   Dg Hip  Complete Left  09/16/2014   CLINICAL DATA:  Chronic low back pain. Left hip pain. Radiation down left leg. Initial evaluation.  EXAM: LEFT HIP - COMPLETE 2+ VIEW  COMPARISON:  None.  FINDINGS: Soft tissue structures are unremarkable. Degenerative changes lumbar spine and both hips. No acute bony abnormality identified.  IMPRESSION: No acute abnormality.  Degenerative changes both hips.   Electronically Signed   By: Marcello Moores  Register   On: 09/16/2014 09:00       Assessment & Plan:   Atypical ear/throat/chest symptoms w/ intermittent "cold spells" - non diagnostic hx and exam today  check CXR today and labs - VS stable including O2 reassurance and work on management of chronic med issues  Problem List Items Addressed This Visit    Hyperlipidemia    Cards changed prava to atorva 04/2012, [facility] Check lipids annually, titrate as needed The current medical regimen is effective;  continue present plan and medications.       Relevant Medications   losartan (COZAAR) 100 MG tablet   pravastatin (PRAVACHOL) 20 MG tablet   Other Relevant Orders   Lipid panel   HYPERTENSION, BENIGN - Primary    changed coreg to low dose atenolol 03/2012 Added amlodipine early 09/2012 - 01/2013 added ARB (also for DM) - titrate to max dose today (07/2015) follow up cards as planned    BP Readings from Last 3 Encounters:  08/07/15 120/78  04/01/15 136/82  03/04/15 138/86        Relevant Medications   losartan (COZAAR) 100 MG tablet   pravastatin (PRAVACHOL) 20 MG tablet   Other Relevant Orders   Basic metabolic panel   Type 2 diabetes, uncontrolled, with neuropathy    Historically uncontrolled, but improved per report since 2013 CVA event Changed to Lantus 03/2012 hosp for TIA , but stopped same 10/2012 due to hypoglycemia-  off metformin/januvia since 03/2012 Continue Novolog TID AC - check a1c q3-42mo and titrate as needed Also encouraged follow up with endo - ellison On statin, check  microalb and ARB (allg to ACEI)  Lab Results  Component Value Date   HGBA1C 7.9* 09/09/2014        Relevant Medications   losartan (COZAAR) 100 MG tablet   pravastatin (PRAVACHOL) 20 MG tablet   Other Relevant Orders   Hemoglobin A1c   Lipid panel   Microalbumin / creatinine urine ratio    Other Visit Diagnoses    Chest pain, atypical        Relevant Orders    DG Chest 2 View    Basic metabolic panel    CBC with Differential/Platelet    Hepatic function panel        [name], MD

## 2015-08-07 NOTE — Assessment & Plan Note (Signed)
changed coreg to low dose atenolol 03/2012 Added amlodipine early 09/2012 - 01/2013 added ARB (also for DM) - titrate to max dose today (07/2015) follow up cards as planned    BP Readings from Last 3 Encounters:  08/07/15 120/78  04/01/15 136/82  03/04/15 138/86

## 2015-08-07 NOTE — Assessment & Plan Note (Signed)
Historically uncontrolled, but improved per report since 2013 CVA event Changed to Lantus 03/2012 hosp for TIA , but stopped same 10/2012 due to hypoglycemia-  off metformin/januvia since 03/2012 Continue Novolog TID AC - check a1c q3-6mo and titrate as needed Also encouraged follow up with endo - ellison On statin, check microalb and ARB (allg to ACEI)  Lab Results  Component Value Date   HGBA1C 7.9* 09/09/2014

## 2015-08-07 NOTE — Patient Instructions (Signed)
It was good to see you today.  We discussed various immunizations and declined today due to concern for allergy to preservative component and flu and pneumonia vaccines  We have reviewed your prior records including labs and tests today  Test(s) ordered today. Your results will be released to [place] (or called to you) after review, usually within 72hours after test completion. If any changes need to be made, you will be notified at that same time.  Medications reviewed and updated Increase losartan to 100 mg daily every day. No other changes recommended at this time.  Please schedule followup in 6 months, call sooner if problems.

## 2015-08-07 NOTE — Assessment & Plan Note (Signed)
Cards changed prava to atorva 04/2012, [facility] Check lipids annually, titrate as needed The current medical regimen is effective;  continue present plan and medications.

## 2015-08-31 ENCOUNTER — Other Ambulatory Visit: Payer: Self-pay | Admitting: Internal Medicine

## 2015-09-19 ENCOUNTER — Ambulatory Visit (INDEPENDENT_AMBULATORY_CARE_PROVIDER_SITE_OTHER): Payer: Medicare Other | Admitting: Ophthalmology

## 2015-09-19 DIAGNOSIS — H43813 Vitreous degeneration, bilateral: Secondary | ICD-10-CM

## 2015-09-19 DIAGNOSIS — H35033 Hypertensive retinopathy, bilateral: Secondary | ICD-10-CM

## 2015-09-19 DIAGNOSIS — H353121 Nonexudative age-related macular degeneration, left eye, early dry stage: Secondary | ICD-10-CM | POA: Diagnosis not present

## 2015-09-19 DIAGNOSIS — I10 Essential (primary) hypertension: Secondary | ICD-10-CM | POA: Diagnosis not present

## 2015-09-19 DIAGNOSIS — E11319 Type 2 diabetes mellitus with unspecified diabetic retinopathy without macular edema: Secondary | ICD-10-CM | POA: Diagnosis not present

## 2015-09-19 DIAGNOSIS — E113293 Type 2 diabetes mellitus with mild nonproliferative diabetic retinopathy without macular edema, bilateral: Secondary | ICD-10-CM | POA: Diagnosis not present

## 2015-09-25 ENCOUNTER — Telehealth: Payer: Self-pay | Admitting: *Deleted

## 2015-09-25 NOTE — Telephone Encounter (Signed)
Form,[place] [employer] received from [place] sent to Katrina and Dr Leonie Man 09/25/15.

## 2015-09-26 ENCOUNTER — Telehealth: Payer: Self-pay

## 2015-09-26 DIAGNOSIS — Z0289 Encounter for other administrative examinations: Secondary | ICD-10-CM

## 2015-09-26 NOTE — Telephone Encounter (Signed)
Rn call patient about the form from [employer]. Rn ask patient about needing clarification for the form. Pt stated he is currently disable and cannot pay back the loans he receive from the government. Pt stated if the MD filled the form out based on his disability and current health, the loans could be waive. Rn stated Dr.Sethi will be in the office next week and will look over the form to see if If can be completed. Pt has an appt schedule next week., Pt was last seen 2015.

## 2015-10-03 ENCOUNTER — Encounter: Payer: Self-pay | Admitting: Neurology

## 2015-10-03 ENCOUNTER — Ambulatory Visit (INDEPENDENT_AMBULATORY_CARE_PROVIDER_SITE_OTHER): Payer: Medicare Other | Admitting: Neurology

## 2015-10-03 VITALS — BP 157/80 | HR 64 | Ht 71.5 in | Wt 218.8 lb

## 2015-10-03 DIAGNOSIS — G8110 Spastic hemiplegia affecting unspecified side: Secondary | ICD-10-CM

## 2015-10-03 NOTE — Progress Notes (Signed)
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 Vascular risk factors of hypertension, hyperlipidemia, diabetes , cerebrovascular disease and coronary artery disease   03/27/13 (PS):  He returns for followup of her last visit on 12/26/12. He states he continues to show improvement and has finished physical therapy last week. He plans to join [employer] for [employer]. He is walking with a cane and a left foot AFO. He also sees a reflexologist for improving his feelings and balance. He states his sugars are not well controlled with fasting glucoses running in the 150-160 range. His blood pressure usually runs in the 130s at home though it is elevated at 152/93 in office today. His last hemoglobin A1c was 7.5 in February 2014. He has discontinued the baclofen which in fact was making his leg spasms worse as well as Lyrica which did not help assist his pain. He does not want to try any new medications for the same at the present time.   UPDATE 09/27/13 (LL):Patient returns to office for stroke revisit. He states he is doing some better but feels he has a long way to go. He states his BP is well controlled, BP in office today is 139/82. He states he is still having a lot of pain in his left lower back, left hamstring, left biceps area and left ankle. He is going to see Dr. Wynona Canes at [facility] to see if there are any therapies or trials there that may help him. He also sees a Electrical engineer there, Wonda Amis, who has been working with him on his speech. He has not had his lipids or other labs checked lately but has already eaten today so I asked him to please see his PCP to have lipids and A1c checked. He  is taking Nortriptyline at night which he states has helped the twitching in his legs. He is tolerating Plavix daily without known side effects. HAM-A score 12, very mild anxiety.   UPDATE 03/27/14 (LL): Since last visit, patient has been attending PT at the [facility] in [place] which he finds beneficial.  He still has significant spasticity on left side, but did not tolerate Baclofen in the past.  He does not want to try another medication currently. He states his BP is well controlled, though it is elevated in office today is 160/88.  His blood sugars range from 90's to 180's, last Hgb a1c was 7.4.  He is tolerating Plavix well with no signs of significant bleeding or bruising. UPDATE 10/02/2014 ( PS ) : he returns for follow-up after last visit 6 months ago. He continues to do well without recurrent stroke or neurovascular symptoms. He still has significant left-sided spasticity and mild gait difficulty. He states his blood pressure is well controlled and it is 138/83 in office today. He is currently on 3 different blood pressure medications and is wondering if he wants to reduce and stop some of these. He has recently, several of the medications and feels that he is feeling better. He is also questioning whether he needs nortriptyline and which takes at night. He has no other new complaints. He recently had hemoglobin A1c on 09/09/14 which was elevated at 7.9 total cholesterol was 125, triglycerides 93, HDL 37 and LDL 69 mg  percent. Update 10/03/2015 : He returns for follow-up after last visit a year ago. He continues to have left-sided weakness, spasticity, tightness in his legs as well as coordination difficulties. He has brought in paperwork to renew his disability. He states his blood pressure is well controlled usually though today it is elevated at 157/80. He does have an upcoming appointment with his primary physician next week to discuss blood pressure management. His fasting sugars range from 80-110 range  and are adequate. He had lipid profile checked a few months ago and was satisfactory. He had follow-up carotid ultrasound done after last visit on 10/24/14 which was unremarkable. The patient is bothered by a tightness and spasticity in his legs but is not willing to consider medication like baclofen, Zanaflex. ROS:  14 system review of systems is positive for fever, unexpected weight change, excessive sweating, runny nose, trouble swallowing, cough, leg swelling, cold intolerance, abdominal pain, diarrhea, nausea, frequent waking, daytime sleepiness, snoring, environmental and food allergies, frequency of urination, urgency, testicular pain, joint pain and swelling, back pain, aching muscles, muscle cramps, walking difficulty, neck pain, skin moles and itching, headache, numbness, weakness, tremors  ALLERGIES: Allergies  Allergen Reactions  . Penicillins Anaphylaxis  . Pneumococcal Vaccines Anaphylaxis  . Shellfish Allergy Anaphylaxis  . Influenza Vaccines   . Lisinopril     cough  . Topiramate Other (See Comments)    Chest spasms and numbness    HOME MEDICATIONS: Outpatient Prescriptions Prior to Visit  Medication Sig Dispense Refill  . atenolol (TENORMIN) 25 MG tablet TAKE ONE TABLET BY MOUTH IN THE EVENING 30 tablet 11  . cetirizine (ZYRTEC) 10 MG tablet Take 10 mg by mouth as needed.    . clopidogrel (PLAVIX) 75 MG tablet Take 1 tablet (75 mg total) by mouth daily. 30 tablet 11  . esomeprazole (NEXIUM) 40 MG capsule Take 40 mg by mouth daily at 12 noon.    Marland Kitchen glucose blood (PRODIGY NO CODING BLOOD GLUC) test strip Use one strip to test blood sugar four times a day Dx 250.62 100 each 11  . hydrochlorothiazide (MICROZIDE) 12.5 MG capsule Take 1 capsule (12.5 mg total) by mouth daily. 90 capsule 2  . Insulin Glargine (LANTUS SOLOSTAR) 100 UNIT/ML Solostar Pen Inject 30 Units into the skin daily at 10 pm. 5 pen PRN  . Insulin Pen Needle (ULTICARE SHORT PEN NEEDLES) 31G X 8 MM MISC USE AS  DIRECTED 5 TIMES DAILY 150 each 0  . losartan (COZAAR) 100 MG tablet Take 1 tablet (100 mg total) by mouth daily. 30 tablet 11  . pravastatin (PRAVACHOL) 20 MG tablet Take 1 tablet (20 mg total) by mouth daily. 90 tablet 3  . PRODIGY TWIST TOP LANCETS 28G MISC USE ONE LANCET TO CHECK GLUCOSE 4 TIMES DAILY 120 each 5  . senna-docusate (SENOKOT-S) 8.6-50 MG per tablet Take 2 tablets by mouth 2 (two) times daily. For constipation.    . nitroGLYCERIN (NITROSTAT) 0.4 MG SL tablet Place 0.4 mg under the tongue every 5 (five) minutes as needed. For chest pain     No facility-administered medications prior to visit.     PHYSICAL EXAM  Filed Vitals:   10/03/15 0951  BP: 157/80  Pulse: 64  Height: 5' 11.5" (1.816 m)  Weight: 218 lb 12.8 oz (99.247 kg)   Body mass index is 30.09 kg/(m^2).  Physical Exam  General: well developed, well nourished, middle aged  [place] American male seated, in no evident distress  Head: head normocephalic and  atraumatic.   Neck: supple with no carotid or supraclavicular bruits  Cardiovascular: regular rate and rhythm, no murmurs  Vascular: Normal pulses all extremities   Neurologic Exam  Mental Status: Awake and fully alert. Oriented to place and time. Recent and remote memory intact. Attention span, concentration and fund of knowledge appropriate. Mood and affect appropriate.  Cranial Nerves: Pupils equal, briskly reactive to light. Extraocular movements full without nystagmus. Visual fields full to confrontation. Hearing intact. Mild left lower facial asymmetry Facial sensation intact. Face, tongue, palate moves normally and symmetrically.  Motor: Normal bulk and tone. Normal strength on all tested extremity muscles on right. Spastic left hemiparesis with 4/5 strength on the left with weakness of the left grip and intrinsic hand muscles. Orbits right over left upper extremity. Left foot drop with ankle dorsiflexors weakness..  Sensory: intact to touch and  pinprick and vibratory sensation.  Coordination: Mild left-sided finger-to-nose and knee to heal dysmetria.  Gait and Station: Arises from chair wit mild difficulty. Stance is normal. Gait demonstrates hemiplegia gait with circumduction of the left leg with increased tone. Unable to heel, toe and tandem walk without difficulty.  Reflexes: 1+ and symmetric.   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 Vascular risk factors of hypertension, hyperlipidemia, diabetes, cerebrovascular disease and coronary artery disease. He has resultant left spastic hemiplegia.    PLAN:  I had a long d/w patient about his remote stroke, risk for recurrent stroke/TIAs, personally independently reviewed imaging studies and stroke evaluation results and answered questions.Continue Plavix  for secondary stroke prevention and maintain strict control of hypertension with blood pressure goal below 130/90, diabetes with hemoglobin A1c goal below 6.5% and lipids with LDL cholesterol goal below 100 mg/dL. I also advised the patient to eat a healthy diet with plenty of whole grains, cereals, fruits and vegetables, exercise regularly and maintain ideal body weight. Patient had disability paperwork which was filled out. We also discussed medications for spasticity of his leg like baclofen and Zanaflex but after discussion of side effects he is not willing to try them at the present time. Since it has been 3 years since her stroke I do not believe routine scheduled follow-up appointment with me is necessary. He was instructed to follow-up with his primary care physician next week and discuss more aggressive blood pressure management.   Antony Contras, MD  10/03/2015, 10:43 AM [facility] [address], [place], [place] [address] [phone]  Note: This  document was prepared with digital dictation and possible smart phrase technology. Any transcriptional errors that result from this process are unintentional.

## 2015-10-03 NOTE — Telephone Encounter (Signed)
Pt here for appt.  Form completed and signed.  Given original to pt and also a copy.  A copy to MR.

## 2015-10-03 NOTE — Patient Instructions (Signed)
I had a long d/w patient about his remote stroke, risk for recurrent stroke/TIAs, personally independently reviewed imaging studies and stroke evaluation results and answered questions.Continue Plavix  for secondary stroke prevention and maintain strict control of hypertension with blood pressure goal below 130/90, diabetes with hemoglobin A1c goal below 6.5% and lipids with LDL cholesterol goal below 100 mg/dL. I also advised the patient to eat a healthy diet with plenty of whole grains, cereals, fruits and vegetables, exercise regularly and maintain ideal body weight. Patient had disability paperwork which was filled out. We also discussed medications for spasticity of his leg like baclofen and Zanaflex but after discussion of side effects he is not willing to try them at the present time. Since it has been 3 years since her stroke I do not believe routine scheduled follow-up appointment with me is necessary. He was instructed to follow-up with his primary care physician next week and discuss more aggressive blood pressure management.

## 2015-10-05 ENCOUNTER — Other Ambulatory Visit: Payer: Self-pay | Admitting: Internal Medicine

## 2015-10-05 ENCOUNTER — Other Ambulatory Visit: Payer: Self-pay | Admitting: Family Medicine

## 2015-10-06 ENCOUNTER — Ambulatory Visit (INDEPENDENT_AMBULATORY_CARE_PROVIDER_SITE_OTHER): Payer: Medicare Other | Admitting: Internal Medicine

## 2015-10-06 ENCOUNTER — Encounter: Payer: Self-pay | Admitting: Internal Medicine

## 2015-10-06 VITALS — BP 130/84 | HR 63 | Temp 98.0°F | Resp 18 | Wt 218.0 lb

## 2015-10-06 DIAGNOSIS — I6330 Cerebral infarction due to thrombosis of unspecified cerebral artery: Secondary | ICD-10-CM

## 2015-10-06 DIAGNOSIS — I10 Essential (primary) hypertension: Secondary | ICD-10-CM

## 2015-10-06 DIAGNOSIS — E785 Hyperlipidemia, unspecified: Secondary | ICD-10-CM

## 2015-10-06 DIAGNOSIS — I2510 Atherosclerotic heart disease of native coronary artery without angina pectoris: Secondary | ICD-10-CM | POA: Diagnosis not present

## 2015-10-06 DIAGNOSIS — K219 Gastro-esophageal reflux disease without esophagitis: Secondary | ICD-10-CM | POA: Diagnosis not present

## 2015-10-06 DIAGNOSIS — E1165 Type 2 diabetes mellitus with hyperglycemia: Secondary | ICD-10-CM

## 2015-10-06 DIAGNOSIS — G6289 Other specified polyneuropathies: Secondary | ICD-10-CM

## 2015-10-06 DIAGNOSIS — IMO0002 Reserved for concepts with insufficient information to code with codable children: Secondary | ICD-10-CM

## 2015-10-06 DIAGNOSIS — E1140 Type 2 diabetes mellitus with diabetic neuropathy, unspecified: Secondary | ICD-10-CM | POA: Diagnosis not present

## 2015-10-06 MED ORDER — ESOMEPRAZOLE MAGNESIUM 40 MG PO CPDR: 40.0000 mg | DELAYED_RELEASE_CAPSULE | Freq: Every day | ORAL | Status: DC

## 2015-10-06 MED ORDER — ATENOLOL 50 MG PO TABS: 50.0000 mg | ORAL_TABLET | Freq: Every day | ORAL | Status: DC

## 2015-10-06 MED ORDER — CLOPIDOGREL BISULFATE 75 MG PO TABS: 75.0000 mg | ORAL_TABLET | Freq: Every day | ORAL | Status: DC

## 2015-10-06 MED ORDER — HYDROCHLOROTHIAZIDE 12.5 MG PO CAPS: 12.5000 mg | ORAL_CAPSULE | Freq: Every day | ORAL | Status: DC

## 2015-10-06 NOTE — Assessment & Plan Note (Signed)
On pravastatin 20 mg daily Check lipid panel at next visit - will titrate up if needed

## 2015-10-06 NOTE — Assessment & Plan Note (Addendum)
a1c 07/2015 was 7.3% Continue diabetic diet and regular exercise Eye exam up to date F/u in 3 months - will recheck a1c then Continue lantus 30 units at night

## 2015-10-06 NOTE — Assessment & Plan Note (Signed)
Controlled with daily nexium Symptomatic when he forgets a dose - not ready to taper off at this time Stressed GERD diet

## 2015-10-06 NOTE — Assessment & Plan Note (Signed)
S/p 2 stents Improve BP control - BP medication adjusted Continue healthy diet and regular exercise Lipids have been well controlled - recheck in 3 months Diabetes fairly controlled - recheck a1c at next visit - goal of a1c < 7%

## 2015-10-06 NOTE — Assessment & Plan Note (Signed)
Related to previous stroke Has been on several medications in the past

## 2015-10-06 NOTE — Patient Instructions (Addendum)
  We have reviewed your prior records including labs and tests today.   All other Health Maintenance issues reviewed.   All recommended immunizations and age-appropriate screenings are up-to-date or deferred.  No immunizations administered today.   Medications reviewed and updated.  Changes include increasing atenolol to 50 mg daily.  If your BP is not better controlled at home please call or return.   Your prescription(s) have been submitted to your pharmacy. Please take as directed and contact our office if you believe you are having problem(s) with the medication(s).   Please schedule followup in 3 months

## 2015-10-06 NOTE — Assessment & Plan Note (Signed)
Has been elevated at home  - not controlled Increase atenolol to 50 mg daily Continue losartan 100 mg daily and hctz 12.5 mg daily for now Continue low sodium diet Continue regular exercise Call or return if not controlled

## 2015-10-06 NOTE — Telephone Encounter (Signed)
Refill done.  

## 2015-10-06 NOTE — Progress Notes (Signed)
Pre visit review using our clinic review tool, if applicable. No additional management support is needed unless otherwise documented below in the visit note. 

## 2015-10-06 NOTE — Progress Notes (Signed)
Subjective:    Patient ID: [name], male    DOB: [birth year], 64 y.o.   MRN: [id number]  HPI He is here to establish with a new pcp.  He is here for routine follow up and to review his blood pressure, which was elevated recently. For the past 2-3 weeks his BP has been elevated - home monitor through the [facility] (170/92, 160/95)  Hypertension: He is taking his medication daily. He is compliant with a low sodium diet.  He denies chest pain, palpitations, edema, shortness of breath and regular headaches. He is exercising regularly - he sees a Physiological scientist three times a week.  He does monitor his blood pressure at home through the [facility].    Diabetes: He is taking his medication daily as prescribed. He is compliant with a diabetic diet. He is exercising regularly. He monitors his sugars and they have been running 112-150 recently. He checks her feet daily and denies foot lesions. He is up-to-date with an ophthalmology examination.   CVA with residual left sided weakness:  He has seen neurology recently and has been stable and they did not feel he need to routinely follow up with them.  He does see a Physiological scientist three times a week.   Hyperlipidemia: He is taking his medication daily. He is compliant with a low fat/cholesterol diet. He is exercising regularly.  GERD:  He is taking his medication daily as prescribed.  He denies any GERD symptoms and feels his GERD is well controlled.  If he misses a dose he is symptomatic.      Medications and allergies reviewed with patient and updated if appropriate.  Patient Active Problem List   Diagnosis Date Noted  . Nonallopathic lesion of lumbosacral region 12/11/2014  . Nonallopathic lesion of sacral region 12/11/2014  . Nonallopathic lesion of thoracic region 12/11/2014  . Arthritis of left hip 11/20/2014  . Ischial bursitis of left side 10/28/2014  . Hamstring tightness of left lower extremity 09/16/2014  . Piriformis syndrome of left  side 09/16/2014  . Dysphonia 06/22/2013  . Hemiparesis affecting left side as late effect of stroke ([place]) 10/10/2012  . Dysphagia following cerebrovascular accident 08/14/2012  . Chronic back pain   . Peripheral neuropathy ([place])   . TIA (transient ischemic attack) 04/08/2012  . Stroke ([place]) 02/21/2012  . [place], RIGHT 05/29/2010  . Type 2 diabetes, uncontrolled, with neuropathy ([place]) 04/13/2010  . ALLERGIC RHINITIS 04/13/2010  . GERD 04/13/2010  . TOBACCO USE, QUIT 04/13/2010  . ERECTILE DYSFUNCTION 03/12/2009  . HYPERTENSION, BENIGN 03/12/2009  . CAD, NATIVE VESSEL 03/12/2009  . Hyperlipidemia 03/07/2009    Current Outpatient Prescriptions on File Prior to Visit  Medication Sig Dispense Refill  . atenolol (TENORMIN) 25 MG tablet TAKE ONE TABLET BY MOUTH IN THE EVENING 30 tablet 11  . cetirizine (ZYRTEC) 10 MG tablet Take 10 mg by mouth as needed.    . clopidogrel (PLAVIX) 75 MG tablet Take 1 tablet (75 mg total) by mouth daily. 30 tablet 11  . esomeprazole (NEXIUM) 40 MG capsule TAKE ONE CAPSULE BY MOUTH ONCE DAILY 90 capsule 0  . glucose blood (PRODIGY NO CODING BLOOD GLUC) test strip Use one strip to test blood sugar four times a day Dx 250.62 100 each 11  . hydrochlorothiazide (MICROZIDE) 12.5 MG capsule Take 1 capsule (12.5 mg total) by mouth daily. 90 capsule 2  . Insulin Glargine (LANTUS SOLOSTAR) 100 UNIT/ML Solostar Pen Inject 30 Units into the skin daily  at 10 pm. 5 pen PRN  . Insulin Pen Needle (ULTICARE SHORT PEN NEEDLES) 31G X 8 MM MISC USE AS DIRECTED 5 TIMES DAILY 150 each 0  . losartan (COZAAR) 100 MG tablet Take 1 tablet (100 mg total) by mouth daily. 30 tablet 11  . pravastatin (PRAVACHOL) 20 MG tablet Take 1 tablet (20 mg total) by mouth daily. 90 tablet 3  . PRODIGY TWIST TOP LANCETS 28G MISC USE ONE LANCET TO CHECK GLUCOSE 4 TIMES DAILY 120 each 5  . senna-docusate (SENOKOT-S) 8.6-50 MG per tablet Take 2 tablets by mouth 2 (two) times daily. For  constipation.    . sildenafil (VIAGRA) 100 MG tablet Take 100 mg by mouth daily as needed for erectile dysfunction.     No current facility-administered medications on file prior to visit.    Past Medical History  Diagnosis Date  . ALLERGIC RHINITIS   . CAD, NATIVE VESSEL     BMS to OM1 2001, DES to BMS 2005  . DIAB W/UNSPEC COMP TYPE II/UNSPEC TYPE UNCNTRL   . ERECTILE DYSFUNCTION   . HYPERLIPIDEMIA-MIXED   . MORTON'S NEUROMA, RIGHT   . SHOULDER PAIN, RIGHT   . HYPERTENSION, BENIGN   . Peripheral neuropathy ([place])   . Chronic back pain     herniated disc  . GERD   . Constipation     takes Carafate four times day  . Seasonal allergies     takes Allegra and Benadryl daily prn;uses Flonase daily  . TIA on medication 02/2012  . Stroke, thrombotic ([place]) 07/2012    L HP + hemiparesis, s/p CIR     Past Surgical History  Procedure Laterality Date  . Stent  2001, 2004    coronary stents  . Angioplasty    . Left knee surgury      x 2   . Dental surgery    . Coronary angioplasty  2005    2 stents  . Colonoscopy    . Laryngoplasty  08/07/2012    Procedure: LARYNGOPLASTY;  Surgeon: Izora Gala, MD;  Location: [facility];  Service: ENT;  Laterality: Left;  Left Vocal Cord Medialyzation    Social History   Social History  . Marital Status: Legally Separated    Spouse Name: N/A  . Number of Children: 3  . Years of Education: college   Occupational History  . disabled    Social History Main Topics  . Smoking status: Former Smoker    Quit date: 02/24/1983  . Smokeless tobacco: Never Used  . Alcohol Use: No  . Drug Use: No  . Sexual Activity: No   Other Topics Concern  . Not on file   Social History Narrative   Married, lives in [place] with wife. He is disabled secondary to back pain. 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   Patient has 3 children.   Patient has college education.   Patient drinks 1 cup daily.    Review of Systems    Constitutional: Negative for fever, chills and fatigue.  Respiratory: Negative for cough, shortness of breath and wheezing.   Cardiovascular: Negative for chest pain, palpitations and leg swelling.  Neurological: Positive for dizziness (with sinus issues), weakness (left side - result of CVA), light-headedness, numbness (left side - result of CVA) and headaches (occasional).       Spasticity and muscle tightness on left side from previous cva  Psychiatric/Behavioral: Negative for dysphoric mood. The patient is not  nervous/anxious.        Objective:   Filed Vitals:   10/06/15 0817  BP: 130/84  Pulse: 63  Temp: 98 F (36.7 C)  Resp: 18   Filed Weights   10/06/15 0817  Weight: 218 lb (98.884 kg)   Body mass index is 29.98 kg/(m^2).   Physical Exam  Constitutional: He appears well-developed and well-nourished. No distress.  HENT:  Head: Normocephalic and atraumatic.  Neck: Neck supple. No JVD present. No tracheal deviation present. No thyromegaly present.  No carotid bruit  Cardiovascular: Normal rate, regular rhythm and normal heart sounds.   No murmur heard. Pulmonary/Chest: Effort normal and breath sounds normal. No respiratory distress. He has no wheezes. He has no rales.  Musculoskeletal: He exhibits no edema.  Lymphadenopathy:    He has no cervical adenopathy.  Neurological:  Leg sided weakness, walks with a limp  Psychiatric: He has a normal mood and affect. His behavior is normal.          Assessment & Plan:   See Problem List for A/P  Follow up in 3 months - will consider f/u every 4-6 months if everything is well controlled

## 2015-10-06 NOTE — Assessment & Plan Note (Signed)
Recently saw neuro - has been stable and they do not feel he needs to follow up with them routinely Continue plavix, statin and good BP control

## 2015-10-24 ENCOUNTER — Telehealth: Payer: Self-pay | Admitting: Internal Medicine

## 2015-10-24 ENCOUNTER — Telehealth: Payer: Self-pay

## 2015-10-24 NOTE — Telephone Encounter (Signed)
Patient called and stated that he was started on a new blood pressure medication. He states that his blood pressure keeps getting higher and higher and he was wondering what he needed to do about it. Please advise

## 2015-10-24 NOTE — Telephone Encounter (Signed)
Have him double his hctz to 25mg  daily.  F/u with me soon.

## 2015-10-24 NOTE — Telephone Encounter (Signed)
Pt called in and said that she had a bp med change and it keeps going higher and higher.  She is stated that the meds are not working.  What should she do?     Best number [phone]

## 2015-11-11 NOTE — Telephone Encounter (Addendum)
Pt called and needs to speak to the nurse about form that was filled out. They have returned it to him. Please call and advise [phone]

## 2015-11-18 NOTE — Telephone Encounter (Signed)
Pt called said on the discharge application for permanent disability, section 4, 2b (pg 3)needs to be completed. The form was sent back to him. Pt said he would bring form back to [facility]. Please fax upon completion.

## 2015-11-25 ENCOUNTER — Telehealth: Payer: Self-pay

## 2015-11-25 NOTE — Telephone Encounter (Signed)
Rn call patient about form needing to be clarified. Dr.Sethi needs to document and initial if the patients condition is moderate,severe, or chronic. Pt stated once its done he will pick up.

## 2015-11-25 NOTE — Telephone Encounter (Signed)
Glad to complete

## 2015-11-25 NOTE — Telephone Encounter (Signed)
Form already complete.

## 2015-11-26 NOTE — Telephone Encounter (Signed)
FYI-Patient is calling back. He states he called the Dept [employer] Education and they said it would take a couple of days before knowing if the form that was faxed from our office was received. The patient will check in a couple of days and call back to let us know.

## 2015-11-26 NOTE — Telephone Encounter (Signed)
Rn call patient to notified him that the form was fax back to [employer] about the loan. Rn stated the a form was fax again. Pt will call the dept to ask if the original form is acceptable.

## 2015-12-04 NOTE — Telephone Encounter (Signed)
Rn call patient to state if the Dept of education has the form that Dr.Sethi filled out and revise. Pt stated they did receive the form, and he will pick up a fax copy this week or next week. Rn stated its in the envelope at the front desk.

## 2016-01-12 ENCOUNTER — Ambulatory Visit (INDEPENDENT_AMBULATORY_CARE_PROVIDER_SITE_OTHER): Payer: Medicare Other | Admitting: Internal Medicine

## 2016-01-12 ENCOUNTER — Other Ambulatory Visit (INDEPENDENT_AMBULATORY_CARE_PROVIDER_SITE_OTHER): Payer: Medicare Other

## 2016-01-12 ENCOUNTER — Encounter: Payer: Self-pay | Admitting: Internal Medicine

## 2016-01-12 VITALS — BP 158/98 | HR 63 | Temp 97.9°F | Resp 16 | Wt 225.0 lb

## 2016-01-12 DIAGNOSIS — Z139 Encounter for screening, unspecified: Secondary | ICD-10-CM

## 2016-01-12 DIAGNOSIS — E1165 Type 2 diabetes mellitus with hyperglycemia: Secondary | ICD-10-CM | POA: Diagnosis not present

## 2016-01-12 DIAGNOSIS — Z1211 Encounter for screening for malignant neoplasm of colon: Secondary | ICD-10-CM

## 2016-01-12 DIAGNOSIS — E1140 Type 2 diabetes mellitus with diabetic neuropathy, unspecified: Secondary | ICD-10-CM | POA: Diagnosis not present

## 2016-01-12 DIAGNOSIS — I10 Essential (primary) hypertension: Secondary | ICD-10-CM

## 2016-01-12 DIAGNOSIS — IMO0002 Reserved for concepts with insufficient information to code with codable children: Secondary | ICD-10-CM

## 2016-01-12 DIAGNOSIS — J069 Acute upper respiratory infection, unspecified: Secondary | ICD-10-CM

## 2016-01-12 DIAGNOSIS — K219 Gastro-esophageal reflux disease without esophagitis: Secondary | ICD-10-CM

## 2016-01-12 DIAGNOSIS — I2510 Atherosclerotic heart disease of native coronary artery without angina pectoris: Secondary | ICD-10-CM

## 2016-01-12 LAB — COMPREHENSIVE METABOLIC PANEL
ALT: 24 U/L (ref 0–53)
AST: 24 U/L (ref 0–37)
Albumin: 4.3 g/dL (ref 3.5–5.2)
Alkaline Phosphatase: 63 U/L (ref 39–117)
BUN: 22 mg/dL (ref 6–23)
CO2: 32 mEq/L (ref 19–32)
Calcium: 9.4 mg/dL (ref 8.4–10.5)
Chloride: 103 mEq/L (ref 96–112)
Creatinine, Ser: 1.46 mg/dL (ref 0.40–1.50)
GFR: 62.09 mL/min (ref >60.00)
Glucose, Bld: 195 mg/dL — ABNORMAL HIGH (ref 70–99)
Potassium: 4 mEq/L (ref 3.5–5.1)
Sodium: 140 mEq/L (ref 135–145)
Total Bilirubin: 0.5 mg/dL (ref 0.2–1.2)
Total Protein: 8 g/dL (ref 6.0–8.3)

## 2016-01-12 LAB — HEPATITIS C ANTIBODY: HCV Ab: NEGATIVE

## 2016-01-12 LAB — LIPID PANEL
Cholesterol: 170 mg/dL (ref 0–200)
HDL: 44.3 mg/dL (ref >39.00)
NonHDL: 126.01
Total CHOL/HDL Ratio: 4
Triglycerides: 215 mg/dL — ABNORMAL HIGH (ref 0.0–149.0)
VLDL: 43 mg/dL — ABNORMAL HIGH (ref 0.0–40.0)

## 2016-01-12 LAB — HEMOGLOBIN A1C: Hgb A1c MFr Bld: 7.6 % — ABNORMAL HIGH (ref 4.6–6.5)

## 2016-01-12 LAB — LDL CHOLESTEROL, DIRECT: Direct LDL: 63 mg/dL

## 2016-01-12 MED ORDER — VALSARTAN 320 MG PO TABS: 320.0000 mg | ORAL_TABLET | Freq: Every day | ORAL | Status: DC

## 2016-01-12 MED ORDER — AZITHROMYCIN 250 MG PO TABS
ORAL_TABLET | ORAL | Status: DC
Start: 2016-01-12 — End: 2016-02-02

## 2016-01-12 NOTE — Progress Notes (Signed)
Subjective:    Patient ID: [name], male    DOB: August 06, 1951, 65 y.o.   MRN: [id number]  HPI He is here for follow up of his diabetes and hypertension.  Cold symptoms:  He has symptoms for 3-4 weeks.  He has coughing that was productive and is now mostly dry.  He has had some sweats, chills, nasal congestion, sinus pressure on the left side, sore throat, SOB, headaches and lightheadedness.  He has had some improvement in his cough, but the rest of his symptoms have not changed.   Diabetes: He is taking his medication daily as prescribed. He is compliant with a diabetic diet. He is exercising regularly. He checks her feet daily and denies foot lesions. He is up-to-date with an ophthalmology examination.   GERD:  He is taking his medication daily as prescribed.  He denies any GERD symptoms and feels his GERD is well controlled.   Hypertension: He is taking his medication daily. He is compliant with a low sodium diet.  He denies palpitations, shortness of breath and regular headaches. He is exercising regularly.  He does monitor his blood pressure at home and it has still stayed elevated even with increasing the atenolol.    CAD:  He is taking his medication daily.  He has not seen cardiology in years.  He does have occasional chest pain that is relieved with an aspirin.  He feels his GERD is controlled.    Medications and allergies reviewed with patient and updated if appropriate.  Patient Active Problem List   Diagnosis Date Noted  . Nonallopathic lesion of lumbosacral region 12/11/2014  . Nonallopathic lesion of sacral region 12/11/2014  . Nonallopathic lesion of thoracic region 12/11/2014  . Arthritis of left hip 11/20/2014  . Ischial bursitis of left side 10/28/2014  . Hamstring tightness of left lower extremity 09/16/2014  . Piriformis syndrome of left side 09/16/2014  . Dysphonia 06/22/2013  . Hemiparesis affecting left side as late effect of stroke ([place]) 10/10/2012    . Dysphagia following cerebrovascular accident 08/14/2012  . Chronic back pain   . Peripheral neuropathy ([place])   . TIA (transient ischemic attack) 04/08/2012  . Stroke ([place]) 02/21/2012  . [place], RIGHT 05/29/2010  . Type 2 diabetes, uncontrolled, with neuropathy ([place]) 04/13/2010  . ALLERGIC RHINITIS 04/13/2010  . GERD 04/13/2010  . TOBACCO USE, QUIT 04/13/2010  . ERECTILE DYSFUNCTION 03/12/2009  . HYPERTENSION, BENIGN 03/12/2009  . CAD, NATIVE VESSEL 03/12/2009  . Hyperlipidemia 03/07/2009    Current Outpatient Prescriptions on File Prior to Visit  Medication Sig Dispense Refill  . atenolol (TENORMIN) 50 MG tablet Take 1 tablet (50 mg total) by mouth daily. 90 tablet 3  . cetirizine (ZYRTEC) 10 MG tablet Take 10 mg by mouth as needed.    . clopidogrel (PLAVIX) 75 MG tablet Take 1 tablet (75 mg total) by mouth daily. 90 tablet 2  . esomeprazole (NEXIUM) 40 MG capsule Take 1 capsule (40 mg total) by mouth daily. 90 capsule 2  . glucose blood (PRODIGY NO CODING BLOOD GLUC) test strip Use one strip to test blood sugar four times a day Dx 250.62 100 each 11  . hydrochlorothiazide (MICROZIDE) 12.5 MG capsule Take 1 capsule (12.5 mg total) by mouth daily. 90 capsule 2  . Insulin Glargine (LANTUS SOLOSTAR) 100 UNIT/ML Solostar Pen Inject 30 Units into the skin daily at 10 pm. 5 pen PRN  . Insulin Pen Needle (ULTICARE SHORT PEN NEEDLES) 31G X 8 MM  MISC USE AS DIRECTED 5 TIMES DAILY 150 each 0  . losartan (COZAAR) 100 MG tablet Take 1 tablet (100 mg total) by mouth daily. 30 tablet 11  . pravastatin (PRAVACHOL) 20 MG tablet Take 1 tablet (20 mg total) by mouth daily. 90 tablet 3  . PRODIGY TWIST TOP LANCETS 28G MISC USE ONE LANCET TO CHECK GLUCOSE 4 TIMES DAILY 120 each 5  . senna-docusate (SENOKOT-S) 8.6-50 MG per tablet Take 2 tablets by mouth 2 (two) times daily. For constipation.     No current facility-administered medications on file prior to visit.    Past Medical History   Diagnosis Date  . ALLERGIC RHINITIS   . CAD, NATIVE VESSEL     BMS to OM1 2001, DES to BMS 2005  . DIAB W/UNSPEC COMP TYPE II/UNSPEC TYPE UNCNTRL   . ERECTILE DYSFUNCTION   . HYPERLIPIDEMIA-MIXED   . MORTON'S NEUROMA, RIGHT   . SHOULDER PAIN, RIGHT   . HYPERTENSION, BENIGN   . Peripheral neuropathy ([place])   . Chronic back pain     herniated disc  . GERD   . Constipation     takes Carafate four times day  . Seasonal allergies     takes Allegra and Benadryl daily prn;uses Flonase daily  . TIA on medication 02/2012  . 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 Stent  2001, 2004    coronary stents  . Angioplasty    . Left knee surgury      x 2   . Dental surgery    . Coronary angioplasty  2005    2 stents  . Colonoscopy    . Laryngoplasty  08/07/2012    Procedure: LARYNGOPLASTY;  Surgeon: Izora Gala, MD;  Location: [facility];  Service: ENT;  Laterality: Left;  Left Vocal Cord Medialyzation    Social History   Social History  . Marital Status: Legally Separated    Spouse Name: N/A  . Number of Children: 3  . Years of Education: college   Occupational History  . disabled    Social History Main Topics  . Smoking status: Former Smoker    Quit date: 02/24/1983  . Smokeless tobacco: Never Used  . Alcohol Use: No  . Drug Use: No  . Sexual Activity: No   Other Topics Concern  . None   Social History Narrative   Married, lives in [place] with wife. He is disabled secondary to back pain. 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   Patient has 3 children.   Patient has college education.   Patient drinks 1 cup daily.    Family History  Problem Relation Age of Onset  . Cancer Mother     Review of Systems  Constitutional: Positive for chills and diaphoresis. Negative for fever.  HENT: Positive for congestion, sinus pressure and sore throat. Negative for ear pain.    Respiratory: Positive for cough and shortness of breath (with coughing only, not with exertion). Negative for wheezing.   Cardiovascular: Positive for chest pain (occ chest pain - relieved by asa) and leg swelling (occasional). Negative for palpitations.  Genitourinary: Positive for urgency.  Neurological: Positive for light-headedness and headaches (sinus ).       Objective:   Filed Vitals:   01/12/16 0820  BP: 158/98  Pulse: 63  Temp: 97.9 F (36.6 C)  Resp:  16   Filed Weights   01/12/16 0820  Weight: 225 lb (102.059 kg)   Body mass index is 30.95 kg/(m^2).   Physical Exam Constitutional: Appears well-developed and well-nourished. No distress.  Neck: Neck supple. No tracheal deviation present. No thyromegaly present.  No carotid bruit. No cervical adenopathy.   Cardiovascular: Normal rate, regular rhythm and normal heart sounds.   No murmur heard.  trace edema Pulmonary/Chest: Effort normal and breath sounds normal. No respiratory distress. No wheezes.         Assessment & Plan:   URI Persistent for 3-4 weeks Will prescribe a zpak Rest, fluids Call if no improvement  Due for a colonoscopy - referred today  See Problem List for Assessment and Plan of chronic medical problems.  F/u in 3 months, sooner if needed

## 2016-01-12 NOTE — Progress Notes (Signed)
Pre visit review using our clinic review tool, if applicable. No additional management support is needed unless otherwise documented below in the visit note. 

## 2016-01-12 NOTE — Assessment & Plan Note (Signed)
BP not controlled D/c losartan and start valsartan 320 mg daily Continue other meds

## 2016-01-12 NOTE — Assessment & Plan Note (Signed)
Has occasional chest pain - relieved with ASA Will refer to cardio - may need stress testing Will adjust BP meds

## 2016-01-12 NOTE — Assessment & Plan Note (Signed)
Check a1c cotninue regular exercise and diabetic diet Continue insulin as is

## 2016-01-12 NOTE — Patient Instructions (Addendum)
  We have reviewed your prior records including labs and tests today.  Test(s) ordered today. Your results will be released to [facility] (or called to you) after review, usually within 72hours after test completion. If any changes need to be made, you will be notified at that same time.  All other Health Maintenance issues reviewed.   All recommended immunizations and age-appropriate screenings are up-to-date.  No immunizations administered today.   Medications reviewed and updated.  Changes include starting an antibiotic for your cold and changing losartan to valsartan for your blood pressure.  If your blood pressure remains elevated make an appointment.   Your prescription(s) have been submitted to your pharmacy. Please take as directed and contact our office if you believe you are having problem(s) with the medication(s).  A referral was ordered for GI and cardiology.  Please schedule followup in 3 months     r

## 2016-01-12 NOTE — Assessment & Plan Note (Signed)
Controlled Continue nexium daily

## 2016-01-15 ENCOUNTER — Encounter: Payer: Self-pay | Admitting: Internal Medicine

## 2016-02-02 ENCOUNTER — Encounter: Payer: Self-pay | Admitting: Cardiovascular Disease

## 2016-02-02 ENCOUNTER — Ambulatory Visit (INDEPENDENT_AMBULATORY_CARE_PROVIDER_SITE_OTHER): Payer: Medicare Other | Admitting: Cardiovascular Disease

## 2016-02-02 VITALS — BP 148/98 | HR 64 | Ht 71.5 in | Wt 227.1 lb

## 2016-02-02 DIAGNOSIS — E785 Hyperlipidemia, unspecified: Secondary | ICD-10-CM

## 2016-02-02 DIAGNOSIS — R05 Cough: Secondary | ICD-10-CM | POA: Diagnosis not present

## 2016-02-02 DIAGNOSIS — I2510 Atherosclerotic heart disease of native coronary artery without angina pectoris: Secondary | ICD-10-CM | POA: Diagnosis not present

## 2016-02-02 DIAGNOSIS — I447 Left bundle-branch block, unspecified: Secondary | ICD-10-CM | POA: Diagnosis not present

## 2016-02-02 DIAGNOSIS — I119 Hypertensive heart disease without heart failure: Secondary | ICD-10-CM

## 2016-02-02 DIAGNOSIS — Z955 Presence of coronary angioplasty implant and graft: Secondary | ICD-10-CM

## 2016-02-02 DIAGNOSIS — R079 Chest pain, unspecified: Secondary | ICD-10-CM

## 2016-02-02 DIAGNOSIS — R059 Cough, unspecified: Secondary | ICD-10-CM

## 2016-02-02 HISTORY — DX: Left bundle-branch block, unspecified: I44.7

## 2016-02-02 MED ORDER — HYDROCHLOROTHIAZIDE 25 MG PO TABS: 25.0000 mg | ORAL_TABLET | Freq: Every day | ORAL | Status: DC

## 2016-02-02 MED ORDER — BENZONATATE 200 MG PO CAPS: 200.0000 mg | ORAL_CAPSULE | Freq: Four times a day (QID) | ORAL | Status: DC | PRN

## 2016-02-02 NOTE — Progress Notes (Signed)
Cardiology Office Note   Date:  02/02/2016   ID:  [name], DOB 1951-09-26, MRN [id number]  PCP:  Binnie Rail, MD  Cardiologist:   Sharol Harness, MD   Chief Complaint  Patient presents with  . New Patient (Initial Visit)     no chest pain, has shortness of breath, no edema, has pain & cramping in legs on left side, has lightheadedness & dizziness, has fatigue      History of Present Illness: [name] is a 65 y.o. male with hypertension, CAD status post PCI, stroke, diabetes mellitus type 2, hyperlipidemia, and prior tobacco abuse who presents to establish care. [name] saw his PCP, Dr/ Billey Gosling on 01/12/16. At that appointment he was doing well but had not seen a cardiologist in years. Therefore he was referred to cardiology for management of his coronary artery disease.   He did report occasional chest pain that was relieved with aspirin.  For the last year he reports episodes of chest pain that occur 1-2 times per month.  The pain is burning in nature and spreads across his chest.   It usually occurs after eating or at rest.  There is associated shortness of breath, nausea and diaphoresis.  It improves after taking aspirin.  The pain when he had his heart attack was similar in quality but much more severe.  [name] exercises three times per month for the last 6 months.  He does cardio and lifts weights for 1 hour each time.  He denies chest pain or shortness of breath but has noted dizziness and nausea that lasts for 5-6 minutes.  He denies lower extremity edema, orthopnea or PND.  [name] reports that his blood pressure is typically well-controlled.  It is monitored through the [place].     Past Medical History  Diagnosis Date  . ALLERGIC RHINITIS   . CAD, NATIVE VESSEL     BMS to OM1 2001, DES to BMS 2005  . DIAB W/UNSPEC COMP TYPE II/UNSPEC TYPE UNCNTRL   . ERECTILE DYSFUNCTION   . HYPERLIPIDEMIA-MIXED   . MORTON'S NEUROMA, RIGHT   .  SHOULDER PAIN, RIGHT   . HYPERTENSION, BENIGN   . Peripheral neuropathy ([place])   . Chronic back pain     herniated disc  . GERD   . Constipation     takes Carafate four times day  . Seasonal allergies     takes Allegra and Benadryl daily prn;uses Flonase daily  . TIA on medication 02/2012  . 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 LBBB (left bundle branch block) 02/02/2016    Past Surgical History  Procedure Laterality Date  . Stent  2001, 2004    coronary stents  . Angioplasty    . Left knee surgury      x 2   . Dental surgery    . Coronary angioplasty  2005    2 stents  . Colonoscopy    . Laryngoplasty  08/07/2012    Procedure: LARYNGOPLASTY;  Surgeon: Izora Gala, MD;  Location: [facility];  Service: ENT;  Laterality: Left;  Left Vocal Cord Medialyzation     Current Outpatient Prescriptions  Medication Sig Dispense Refill  . atenolol (TENORMIN) 50 MG tablet Take 1 tablet (50 mg total) by mouth daily. 90 tablet 3  . cetirizine (ZYRTEC) 10 MG tablet Take 10 mg by mouth as needed.    . clopidogrel (PLAVIX) 75  MG tablet Take 1 tablet (75 mg total) by mouth daily. 90 tablet 2  . esomeprazole (NEXIUM) 40 MG capsule Take 1 capsule (40 mg total) by mouth daily. 90 capsule 2  . glucose blood (PRODIGY NO CODING BLOOD GLUC) test strip Use one strip to test blood sugar four times a day Dx 250.62 100 each 11  . Insulin Glargine (LANTUS SOLOSTAR) 100 UNIT/ML Solostar Pen Inject 30 Units into the skin daily at 10 pm. 5 pen PRN  . Insulin Pen Needle (ULTICARE SHORT PEN NEEDLES) 31G X 8 MM MISC USE AS DIRECTED 5 TIMES DAILY 150 each 0  . pravastatin (PRAVACHOL) 20 MG tablet Take 1 tablet (20 mg total) by mouth daily. 90 tablet 3  . PRODIGY TWIST TOP LANCETS 28G MISC USE ONE LANCET TO CHECK GLUCOSE 4 TIMES DAILY 120 each 5  . senna-docusate (SENOKOT-S) 8.6-50 MG per tablet Take 2 tablets by mouth 2 (two) times daily. For constipation.    . valsartan (DIOVAN) 320 MG  tablet Take 1 tablet (320 mg total) by mouth daily. 90 tablet 1  . benzonatate (TESSALON) 200 MG capsule Take 1 capsule (200 mg total) by mouth every 6 (six) hours as needed for cough. 20 capsule 0  . hydrochlorothiazide (HYDRODIURIL) 25 MG tablet Take 1 tablet (25 mg total) by mouth daily. 90 tablet 1   No current facility-administered medications for this visit.    Allergies:   Penicillins; Pneumococcal vaccines; Shellfish allergy; Influenza vaccines; Lisinopril; and Topiramate    Social History:  The patient  reports that he quit smoking about 32 years ago. He has never used smokeless tobacco. He reports that he does not drink alcohol or use illicit drugs.   Family History:  The patient's family history includes Cancer in his mother.    ROS:  Please see the history of present illness.   Otherwise, review of systems are positive for L side weakness since his stroke and cough.  All other systems are reviewed and negative.    PHYSICAL EXAM: VS:  BP 148/98 mmHg  Pulse 64  Ht 5' 11.5" (1.816 m)  Wt 103.023 kg (227 lb 2 oz)  BMI 31.24 kg/m2 , BMI Body mass index is 31.24 kg/(m^2). GENERAL:  Well appearing HEENT:  Pupils equal round and reactive, fundi not visualized, oral mucosa unremarkable NECK:  No jugular venous distention, waveform within normal limits, carotid upstroke brisk and symmetric, no bruits, no thyromegaly LYMPHATICS:  No cervical adenopathy LUNGS:  Clear to auscultation bilaterally HEART:  RRR.  PMI not displaced or sustained,S1 and S2 within normal limits, no S3, no S4, no clicks, no rubs, nourmurs ABD:  Flat, positive bowel sounds normal in frequency in pitch, no bruits, no rebound, no guarding, no midline pulsatile mass, no hepatomegaly, no splenomegaly EXT:  2 plus pulses throughout, no edema, no cyanosis no clubbing SKIN:  No rashes no nodules NEURO:  Cranial nerves II through XII grossly intact, motor grossly intact throughout PSYCH:  Cognitively intact, oriented  to person place and time   EKG:  EKG is ordered today. The ekg ordered today demonstrates sinus rhythm rate 64 bpm.  LBBB.    Echo 10/11/12: Study Conclusions  - Left ventricle: The cavity size was normal. Wall thickness was increased in a pattern of mild LVH. Systolic function was normal. The estimated ejection fraction was in the range of 55% to 60%. Wall motion was normal; there were no regional wall motion abnormalities. Doppler parameters are consistent with abnormal left ventricular  relaxation (grade 1 diastolic dysfunction). - Aortic valve: Trivial regurgitation.  Carotid Doppler 10/24/14: Normal study  Recent Labs: 08/07/2015: Hemoglobin 13.5; Platelets 230.0 01/12/2016: ALT 24; BUN 22; Creatinine, Ser 1.46; Potassium 4.0; Sodium 140    Lipid Panel    Component Value Date/Time   CHOL 170 01/12/2016 0931   TRIG 215.0* 01/12/2016 0931   TRIG 200 04/20/2009   HDL 44.30 01/12/2016 0931   CHOLHDL 4 01/12/2016 0931   VLDL 43.0* 01/12/2016 0931   LDLCALC 77 08/07/2015 1117   LDLDIRECT 63.0 01/12/2016 0931      Wt Readings from Last 3 Encounters:  02/02/16 103.023 kg (227 lb 2 oz)  01/12/16 102.059 kg (225 lb)  10/06/15 98.884 kg (218 lb)      ASSESSMENT AND PLAN:  # CAD s/p PCI x2, chest pain: [name]'s chest pain does not seem to be cardiac given that it only occurs after eating or at rest.  He exercises regularly without chest discomfort.  However, he does have nausea and dizziness with exercise.  Also, he has a LBBB on EKG that was not present in 2013.  Therefore, we will order a stress test to evaluate for ischemia.  He would like to walk on the treadmill for the stress.  Given his prior stroke and use of a cane, we will do a modified, walking treadmill Cardiolite.  Continue plavix, atenolol, losartan and pravastatin.  # Hypertension: BP is poorly-controlled. We will increase HCTZ to 25mg .  Continue atenolol and losartan.  # Hyperlipidemia: Lipids  well-controlled.  Continue pravastatin.  # Cough: [name] continues to have a cough after a z-pack.  Lungs are clear on exam.  Tessalon pearls 200 mg tid prn.     Current medicines are reviewed at length with the patient today.  The patient does not have concerns regarding medicines.  The following changes have been made:  Increase HCTZ to 25 mg daily  Labs/ tests ordered today include:    Orders Placed This Encounter  Procedures  . Myocardial Perfusion Imaging  . EKG 12-Lead     Disposition:   FU with Aqueelah Cotrell C. Oval Linsey, MD, [facility] in 2 weeks and 1 year   This note was written with the assistance of speech recognition software.  Please excuse any transcriptional errors.  Signed, Tiffannie Sloss C. Oval Linsey, MD, [facility]  02/02/2016 9:49 AM    [facility]

## 2016-02-02 NOTE — Patient Instructions (Addendum)
Medication Instructions:  INCREASE YOUR HYDROCHLOROTHIAZIDE  TO 25 MG DIALY   RX FOR TESSALON SENT TO PHARMACY 1 EVERY 8 HOURS AS NEEDED FOR COUGH  Labwork: NONE  Testing/Procedures: Your physician has requested that you have a lexiscan myoview. For further information please visit [email or web address]. Please follow instruction sheet, as given. WALKING LEXISCAN   Follow-Up: Your physician recommends that you schedule a follow-up appointment in: [place] physician wants you to follow-up in: [place] will receive a reminder letter in the mail two months in advance. If you don't receive a letter, please call our office to schedule the follow-up appointment.  If you need a refill on your cardiac medications before your next appointment, please call your pharmacy.

## 2016-02-03 ENCOUNTER — Ambulatory Visit: Payer: Medicare Other | Admitting: Internal Medicine

## 2016-02-11 ENCOUNTER — Telehealth (HOSPITAL_COMMUNITY): Payer: Self-pay

## 2016-02-11 NOTE — Telephone Encounter (Signed)
Pt is coming in at 12pm. 

## 2016-02-13 ENCOUNTER — Ambulatory Visit (HOSPITAL_COMMUNITY)
Admission: RE | Admit: 2016-02-13 | Discharge: 2016-02-13 | Disposition: A | Discharge status: Home / Self Care | Payer: Medicare Other | Source: Ambulatory Visit | Attending: Cardiovascular Disease | Admitting: Cardiovascular Disease

## 2016-02-13 DIAGNOSIS — E663 Overweight: Secondary | ICD-10-CM | POA: Diagnosis not present

## 2016-02-13 DIAGNOSIS — Z8673 Personal history of transient ischemic attack (TIA), and cerebral infarction without residual deficits: Secondary | ICD-10-CM | POA: Diagnosis not present

## 2016-02-13 DIAGNOSIS — I2510 Atherosclerotic heart disease of native coronary artery without angina pectoris: Secondary | ICD-10-CM | POA: Insufficient documentation

## 2016-02-13 DIAGNOSIS — R0602 Shortness of breath: Secondary | ICD-10-CM | POA: Insufficient documentation

## 2016-02-13 DIAGNOSIS — R42 Dizziness and giddiness: Secondary | ICD-10-CM | POA: Diagnosis not present

## 2016-02-13 DIAGNOSIS — Z6831 Body mass index (BMI) 31.0-31.9, adult: Secondary | ICD-10-CM | POA: Insufficient documentation

## 2016-02-13 DIAGNOSIS — R079 Chest pain, unspecified: Secondary | ICD-10-CM | POA: Diagnosis not present

## 2016-02-13 DIAGNOSIS — Z87891 Personal history of nicotine dependence: Secondary | ICD-10-CM | POA: Insufficient documentation

## 2016-02-13 DIAGNOSIS — E119 Type 2 diabetes mellitus without complications: Secondary | ICD-10-CM | POA: Diagnosis not present

## 2016-02-13 DIAGNOSIS — R110 Nausea: Secondary | ICD-10-CM | POA: Insufficient documentation

## 2016-02-13 DIAGNOSIS — I10 Essential (primary) hypertension: Secondary | ICD-10-CM | POA: Diagnosis not present

## 2016-02-13 DIAGNOSIS — R5383 Other fatigue: Secondary | ICD-10-CM | POA: Diagnosis not present

## 2016-02-13 LAB — MYOCARDIAL PERFUSION IMAGING
LV dias vol: 129 mL (ref 62–150)
LV sys vol: 78 mL
Peak HR: 87 {beats}/min
Rest HR: 60 {beats}/min
SDS: 3
SRS: 1
SSS: 4
TID: 1.24

## 2016-02-13 MED ORDER — REGADENOSON 0.4 MG/5ML IV SOLN
0.4000 mg | Freq: Once | INTRAVENOUS | Status: AC
  Administered 2016-02-13: 0.4 mg via INTRAVENOUS

## 2016-02-13 MED ORDER — TECHNETIUM TC 99M SESTAMIBI GENERIC - CARDIOLITE
10.7000 | Freq: Once | INTRAVENOUS | Status: AC | PRN
  Administered 2016-02-13: 10.7 via INTRAVENOUS

## 2016-02-13 MED ORDER — TECHNETIUM TC 99M SESTAMIBI GENERIC - CARDIOLITE
31.2000 | Freq: Once | INTRAVENOUS | Status: AC | PRN
  Administered 2016-02-13: 31.2 via INTRAVENOUS

## 2016-02-13 MED ORDER — AMINOPHYLLINE 25 MG/ML IV SOLN
75.0000 mg | Freq: Once | INTRAVENOUS | Status: AC
  Administered 2016-02-13: 75 mg via INTRAVENOUS

## 2016-02-17 ENCOUNTER — Telehealth: Payer: Self-pay | Admitting: *Deleted

## 2016-02-17 DIAGNOSIS — R9430 Abnormal result of cardiovascular function study, unspecified: Secondary | ICD-10-CM

## 2016-02-17 DIAGNOSIS — R079 Chest pain, unspecified: Secondary | ICD-10-CM

## 2016-02-17 NOTE — Telephone Encounter (Signed)
-----   Message from Skeet Latch, MD sent at 02/14/2016  7:12 PM EDT ----- Stress test shows normal blood flow in the coronary arteries.  However, the heart muscle appears weakened.  Please order an echo to better assess how well his heart is squeezing.

## 2016-02-17 NOTE — Telephone Encounter (Signed)
Advised patient and scheduled echo

## 2016-02-26 ENCOUNTER — Encounter: Payer: Self-pay | Admitting: Pharmacist Clinician (PhC)/ Clinical Pharmacy Specialist

## 2016-02-26 ENCOUNTER — Ambulatory Visit (INDEPENDENT_AMBULATORY_CARE_PROVIDER_SITE_OTHER): Payer: Medicare Other | Admitting: Pharmacist Clinician (PhC)/ Clinical Pharmacy Specialist

## 2016-02-26 ENCOUNTER — Telehealth: Payer: Self-pay | Admitting: Pharmacist Clinician (PhC)/ Clinical Pharmacy Specialist

## 2016-02-26 VITALS — BP 152/84 | HR 64 | Ht 71.5 in | Wt 222.2 lb

## 2016-02-26 DIAGNOSIS — I2510 Atherosclerotic heart disease of native coronary artery without angina pectoris: Secondary | ICD-10-CM | POA: Diagnosis not present

## 2016-02-26 DIAGNOSIS — I10 Essential (primary) hypertension: Secondary | ICD-10-CM

## 2016-02-26 MED ORDER — AMLODIPINE BESYLATE 2.5 MG PO TABS: 2.5000 mg | ORAL_TABLET | Freq: Every day | ORAL | Status: DC

## 2016-02-26 MED ORDER — SPIRONOLACTONE 25 MG PO TABS: 25.0000 mg | ORAL_TABLET | Freq: Every day | ORAL | Status: DC

## 2016-02-26 NOTE — Telephone Encounter (Signed)
Patient unwilling to take amlodpine - see office note for today

## 2016-02-26 NOTE — Progress Notes (Signed)
02/26/2016 [name] November 18, 1951 [id number]   HPI:  [name] is a 65 y.o. male patient of Dr Oval Linsey, with a PMH below who presents today for hypertension clinic evaluation.  When he last saw Dr. Oval Linsey his BP was 148/98 so his hctz was increased to 25 mg daily.  Today he reports no problems with this dose increase.  He currently is checking his home BP daily thru a [place] program that takes daily recordings of his weight, BP, HR and glucose.    Cardiac Hx: hypertension, CAD post PCI, stroke (2013), DM, hyperlipidemia, LBBB  Family Hx: no known family history  Social Hx: quit smoking 1984, does not drink alcohol; drinks tea but no other regular caffeine  Diet: eats most meals at home, low sodium   Exercise: has personal trainer at gym 3 days per week for both CV and weight training  Home BP readings: states all home readings in [id number] systolic, with nothing < [id number] or > 160.  Diastolic [id number].    Current antihypertensive medications: valsartan 320 mg qam, hctz 25 mg qam, atenolol 50 mg qpm  Intolerances: lisinopril  Wt Readings from Last 3 Encounters:  02/26/16 222 lb 3.2 oz (100.789 kg)  02/13/16 227 lb (102.967 kg)  02/02/16 227 lb 2 oz (103.023 kg)   BP Readings from Last 3 Encounters:  02/26/16 152/84  02/02/16 148/98  01/12/16 158/98   Pulse Readings from Last 3 Encounters:  02/26/16 64  02/02/16 64  01/12/16 63    Current Outpatient Prescriptions  Medication Sig Dispense Refill  . atenolol (TENORMIN) 50 MG tablet Take 1 tablet (50 mg total) by mouth daily. 90 tablet 3  . benzonatate (TESSALON) 200 MG capsule Take 1 capsule (200 mg total) by mouth every 6 (six) hours as needed for cough. 20 capsule 0  . cetirizine (ZYRTEC) 10 MG tablet Take 10 mg by mouth as needed.    . clopidogrel (PLAVIX) 75 MG tablet Take 1 tablet (75 mg total) by mouth daily. 90 tablet 2  . esomeprazole (NEXIUM) 40 MG capsule Take 1 capsule (40 mg total) by mouth daily.  90 capsule 2  . glucose blood (PRODIGY NO CODING BLOOD GLUC) test strip Use one strip to test blood sugar four times a day Dx 250.62 100 each 11  . hydrochlorothiazide (HYDRODIURIL) 25 MG tablet Take 1 tablet (25 mg total) by mouth daily. 90 tablet 1  . Insulin Glargine (LANTUS SOLOSTAR) 100 UNIT/ML Solostar Pen Inject 30 Units into the skin daily at 10 pm. 5 pen PRN  . Insulin Pen Needle (ULTICARE SHORT PEN NEEDLES) 31G X 8 MM MISC USE AS DIRECTED 5 TIMES DAILY 150 each 0  . pravastatin (PRAVACHOL) 20 MG tablet Take 1 tablet (20 mg total) by mouth daily. 90 tablet 3  . PRODIGY TWIST TOP LANCETS 28G MISC USE ONE LANCET TO CHECK GLUCOSE 4 TIMES DAILY 120 each 5  . senna-docusate (SENOKOT-S) 8.6-50 MG per tablet Take 2 tablets by mouth 2 (two) times daily. For constipation.    Marland Kitchen spironolactone (ALDACTONE) 25 MG tablet Take 1 tablet (25 mg total) by mouth daily. 30 tablet 1  . valsartan (DIOVAN) 320 MG tablet Take 1 tablet (320 mg total) by mouth daily. 90 tablet 1   No current facility-administered medications for this visit.    Allergies  Allergen Reactions  . Penicillins Anaphylaxis  . Pneumococcal Vaccines Anaphylaxis  . Shellfish Allergy Anaphylaxis  . Influenza Vaccines   . Lisinopril  cough  . Topiramate Other (See Comments)    Chest spasms and numbness    Past Medical History  Diagnosis Date  . ALLERGIC RHINITIS   . CAD, NATIVE VESSEL     BMS to OM1 2001, DES to BMS 2005  . DIAB W/UNSPEC COMP TYPE II/UNSPEC TYPE UNCNTRL   . ERECTILE DYSFUNCTION   . HYPERLIPIDEMIA-MIXED   . MORTON'S NEUROMA, RIGHT   . SHOULDER PAIN, RIGHT   . HYPERTENSION, BENIGN   . Peripheral neuropathy ([place])   . Chronic back pain     herniated disc  . GERD   . Constipation     takes Carafate four times day  . Seasonal allergies     takes Allegra and Benadryl daily prn;uses Flonase daily  . TIA on medication 02/2012  . 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 LBBB  (left bundle branch block) 02/02/2016    Blood pressure 152/84, pulse 64, height 5' 11.5" (1.816 m), weight 222 lb 3.2 oz (100.789 kg).    Tommy Medal PharmD CPP [facility]

## 2016-02-26 NOTE — Assessment & Plan Note (Signed)
Today his BP was elevated at 152/84.  Because of the diabetes, his goal is < 140/90.  I will start him on amlodipine 2.5 mg daily and reviewed this medication with him.  Asked that he take it nightly with his atenolol.  I will see him back in 1 month for follow up.  Addendum:  After patient left I received a call that he had a question.  Returned his call, he states that the patient information about amlodipine includes risk of edema or SOB.  States he is not willing to take a medication that can potentially cause this to occur.   I explained that the medication is safe, but the risk of allergic reaction is always a possibility.  Since he will not use the amlodipine, I will prescribe spironolactone 25 mg once daily.  Explained that with this medication he will need to have a BMET about 1 week after starting.  Will mail lab orders to him.  Patient voiced understanding.

## 2016-02-26 NOTE — Patient Instructions (Signed)
Return for a a follow up appointment in 1 month  Your blood pressure today is 152/84  (goal is < 140/90)  Check your blood pressure at home daily and keep record of the readings.  Take your BP meds as follows: add amlodipine 2.5 mg daily in the evening, continue with all other medications  Bring all of your meds, your BP cuff and your record of home blood pressures to your next appointment.  Exercise as you're able, try to walk approximately 30 minutes per day.  Keep salt intake to a minimum, especially watch canned and prepared boxed foods.  Eat more fresh fruits and vegetables and fewer canned items.  Avoid eating in fast food restaurants.    HOW TO TAKE YOUR BLOOD PRESSURE: . Rest 5 minutes before taking your blood pressure. .  Don't smoke or drink caffeinated beverages for at least 30 minutes before. . Take your blood pressure before (not after) you eat. . Sit comfortably with your back supported and both feet on the floor (don't cross your legs). . Elevate your arm to heart level on a table or a desk. . Use the proper sized cuff. It should fit smoothly and snugly around your bare upper arm. There should be enough room to slip a fingertip under the cuff. The bottom edge of the cuff should be 1 inch above the crease of the elbow. . Ideally, take 3 measurements at one sitting and record the average.

## 2016-02-26 NOTE — Telephone Encounter (Signed)
New Message  Pt stated- had appt today w/ Kristen, had follow up questions from ov. Please call back and discuss.

## 2016-03-01 ENCOUNTER — Ambulatory Visit (HOSPITAL_COMMUNITY): Payer: Medicare Other | Attending: Cardiovascular Disease

## 2016-03-01 ENCOUNTER — Other Ambulatory Visit: Payer: Self-pay

## 2016-03-01 ENCOUNTER — Other Ambulatory Visit: Payer: Self-pay | Admitting: Pharmacist Clinician (PhC)/ Clinical Pharmacy Specialist

## 2016-03-01 DIAGNOSIS — E119 Type 2 diabetes mellitus without complications: Secondary | ICD-10-CM | POA: Diagnosis not present

## 2016-03-01 DIAGNOSIS — Z6830 Body mass index (BMI) 30.0-30.9, adult: Secondary | ICD-10-CM | POA: Diagnosis not present

## 2016-03-01 DIAGNOSIS — I119 Hypertensive heart disease without heart failure: Secondary | ICD-10-CM | POA: Diagnosis not present

## 2016-03-01 DIAGNOSIS — R079 Chest pain, unspecified: Secondary | ICD-10-CM | POA: Diagnosis not present

## 2016-03-01 DIAGNOSIS — I2510 Atherosclerotic heart disease of native coronary artery without angina pectoris: Secondary | ICD-10-CM | POA: Diagnosis not present

## 2016-03-01 DIAGNOSIS — E785 Hyperlipidemia, unspecified: Secondary | ICD-10-CM | POA: Diagnosis not present

## 2016-03-01 DIAGNOSIS — I447 Left bundle-branch block, unspecified: Secondary | ICD-10-CM | POA: Diagnosis not present

## 2016-03-01 DIAGNOSIS — R9430 Abnormal result of cardiovascular function study, unspecified: Secondary | ICD-10-CM | POA: Insufficient documentation

## 2016-03-01 DIAGNOSIS — Z87891 Personal history of nicotine dependence: Secondary | ICD-10-CM | POA: Diagnosis not present

## 2016-03-01 DIAGNOSIS — E669 Obesity, unspecified: Secondary | ICD-10-CM | POA: Insufficient documentation

## 2016-03-01 MED ORDER — SPIRONOLACTONE 25 MG PO TABS: 25.0000 mg | ORAL_TABLET | Freq: Every day | ORAL | Status: DC

## 2016-03-11 ENCOUNTER — Telehealth: Payer: Self-pay | Admitting: *Deleted

## 2016-03-11 NOTE — Telephone Encounter (Signed)
-----   Message from Skeet Latch, MD sent at 03/07/2016 10:40 PM EDT ----- Echo shows that his heart isn't squeezing normally, similar to what was seen on the stress test.  It also shows that his heart doesn't relax completely.  We will continue to treat this with medication.  Follow up in 4 months instead of 1 year.

## 2016-03-23 ENCOUNTER — Encounter: Payer: Self-pay | Admitting: Pharmacist Clinician (PhC)/ Clinical Pharmacy Specialist

## 2016-03-23 ENCOUNTER — Ambulatory Visit (INDEPENDENT_AMBULATORY_CARE_PROVIDER_SITE_OTHER): Payer: Medicare Other | Admitting: Pharmacist Clinician (PhC)/ Clinical Pharmacy Specialist

## 2016-03-23 VITALS — BP 144/82 | Ht 71.5 in | Wt 225.8 lb

## 2016-03-23 DIAGNOSIS — I10 Essential (primary) hypertension: Secondary | ICD-10-CM

## 2016-03-23 DIAGNOSIS — I2510 Atherosclerotic heart disease of native coronary artery without angina pectoris: Secondary | ICD-10-CM

## 2016-03-23 MED ORDER — CHLORTHALIDONE 25 MG PO TABS: 25.0000 mg | ORAL_TABLET | Freq: Every day | ORAL | Status: DC

## 2016-03-23 NOTE — Assessment & Plan Note (Signed)
BP today is close to goal at 144/82.  Patient is reluctant to add more medications.  Because of some edema in his left foot, I won't increase the amlodipine dose for now.  Instead will have him switch his hctz 25 mg to chlorthalidone 25 mg once daily.  He is to continue with his home monitoring.  Because he has to record the BP and HR daily for his [place] program, I will have him fax a copy of those reading to [place] in 1 month.

## 2016-03-23 NOTE — Progress Notes (Signed)
03/23/2016 [name] [birth year] [id number]   HPI:  [name] is a 65 y.o. male patient of Dr Oval Linsey, with a PMH below who presents today for hypertension clinic follow up.  I saw him about a month ago and recommended amlodipine 2.5 mg daily.  He called the office later that same day stating he would not take it, as it listed side effects of possible edema or SOB.  We agreed to instead start him on spironolactone 25mg , with a BMET 1 week after starting.  Today he comes in stating that his insurance would not cover the spironolactone, so he instead started with the amlodipine.  He complains of some edema in his left foot, not enough to prevent putting on his shoe.  States is worse in the mornings.  He currently is checking his home BP daily thru a [place] program that takes daily recordings of his weight, BP, HR and glucose.    Cardiac Hx: hypertension, CAD post PCI, stroke (2013), DM, hyperlipidemia, LBBB  Family Hx: no known family history  Social Hx: quit smoking 1984, does not drink alcohol; drinks tea but no other regular caffeine  Diet: eats most meals at home, low sodium   Exercise: has personal trainer at gym 3 days per week for both CV and weight training  Home BP readings: 127-165/74-93, with an average of 148/86.   Current antihypertensive medications: valsartan 320 mg qam, hctz 25 mg qam, atenolol 50 mg qpm, amlodipine 2.5 mg qam  Intolerances: lisinopril (cough)  Wt Readings from Last 3 Encounters:  03/23/16 225 lb 12.8 oz (102.422 kg)  02/26/16 222 lb 3.2 oz (100.789 kg)  02/13/16 227 lb (102.967 kg)   BP Readings from Last 3 Encounters:  03/23/16 144/82  02/26/16 152/84  02/02/16 148/98   Pulse Readings from Last 3 Encounters:  02/26/16 64  02/02/16 64  01/12/16 63    Current Outpatient Prescriptions  Medication Sig Dispense Refill  . atenolol (TENORMIN) 50 MG tablet Take 1 tablet (50 mg total) by mouth daily. 90 tablet 3  . benzonatate  (TESSALON) 200 MG capsule Take 1 capsule (200 mg total) by mouth every 6 (six) hours as needed for cough. 20 capsule 0  . cetirizine (ZYRTEC) 10 MG tablet Take 10 mg by mouth as needed.    . chlorthalidone (HYGROTON) 25 MG tablet Take 1 tablet (25 mg total) by mouth daily. 30 tablet 3  . clopidogrel (PLAVIX) 75 MG tablet Take 1 tablet (75 mg total) by mouth daily. 90 tablet 2  . esomeprazole (NEXIUM) 40 MG capsule Take 1 capsule (40 mg total) by mouth daily. 90 capsule 2  . glucose blood (PRODIGY NO CODING BLOOD GLUC) test strip Use one strip to test blood sugar four times a day Dx 250.62 100 each 11  . Insulin Glargine (LANTUS SOLOSTAR) 100 UNIT/ML Solostar Pen Inject 30 Units into the skin daily at 10 pm. 5 pen PRN  . Insulin Pen Needle (ULTICARE SHORT PEN NEEDLES) 31G X 8 MM MISC USE AS DIRECTED 5 TIMES DAILY 150 each 0  . pravastatin (PRAVACHOL) 20 MG tablet Take 1 tablet (20 mg total) by mouth daily. 90 tablet 3  . PRODIGY TWIST TOP LANCETS 28G MISC USE ONE LANCET TO CHECK GLUCOSE 4 TIMES DAILY 120 each 5  . senna-docusate (SENOKOT-S) 8.6-50 MG per tablet Take 2 tablets by mouth 2 (two) times daily. For constipation.    . valsartan (DIOVAN) 320 MG tablet Take 1 tablet (320 mg total)  by mouth daily. 90 tablet 1   No current facility-administered medications for this visit.    Allergies  Allergen Reactions  . Penicillins Anaphylaxis  . Pneumococcal Vaccines Anaphylaxis  . Shellfish Allergy Anaphylaxis  . Influenza Vaccines   . Lisinopril     cough  . Topiramate Other (See Comments)    Chest spasms and numbness    Past Medical History  Diagnosis Date  . ALLERGIC RHINITIS   . CAD, NATIVE VESSEL     BMS to OM1 2001, DES to BMS 2005  . DIAB W/UNSPEC COMP TYPE II/UNSPEC TYPE UNCNTRL   . ERECTILE DYSFUNCTION   . HYPERLIPIDEMIA-MIXED   . MORTON'S NEUROMA, RIGHT   . SHOULDER PAIN, RIGHT   . HYPERTENSION, BENIGN   . Peripheral neuropathy ([place])   . Chronic back pain     herniated  disc  . GERD   . Constipation     takes Carafate four times day  . Seasonal allergies     takes Allegra and Benadryl daily prn;uses Flonase daily  . TIA on medication 02/2012  . 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 LBBB (left bundle branch block) 02/02/2016    Blood pressure 144/82, height 5' 11.5" (1.816 m), weight 225 lb 12.8 oz (102.422 kg).    Tommy Medal PharmD CPP [facility]

## 2016-03-23 NOTE — Patient Instructions (Signed)
Send in your blood pressure readings around June 1  Your blood pressure today is 144/82  (goal is < 140/90 because of the diabetes)  Check your blood pressure at home daily and keep record of the readings.  Take your BP meds as follows: stop hydrochlorothiazide, start chlorthalidone 25 mg once daily in the mornings  Bring all of your meds, your BP cuff and your record of home blood pressures to your next appointment.  Exercise as you're able, try to walk approximately 30 minutes per day.  Keep salt intake to a minimum, especially watch canned and prepared boxed foods.  Eat more fresh fruits and vegetables and fewer canned items.  Avoid eating in fast food restaurants.    HOW TO TAKE YOUR BLOOD PRESSURE: . Rest 5 minutes before taking your blood pressure. .  Don't smoke or drink caffeinated beverages for at least 30 minutes before. . Take your blood pressure before (not after) you eat. . Sit comfortably with your back supported and both feet on the floor (don't cross your legs). . Elevate your arm to heart level on a table or a desk. . Use the proper sized cuff. It should fit smoothly and snugly around your bare upper arm. There should be enough room to slip a fingertip under the cuff. The bottom edge of the cuff should be 1 inch above the crease of the elbow. . Ideally, take 3 measurements at one sitting and record the average.

## 2016-03-23 NOTE — Telephone Encounter (Signed)
Notes Recorded by Earvin Hansen on 03/11/2016 at 10:39 AM Advised patient ------

## 2016-04-12 ENCOUNTER — Ambulatory Visit (INDEPENDENT_AMBULATORY_CARE_PROVIDER_SITE_OTHER): Payer: Medicare Other | Admitting: Internal Medicine

## 2016-04-12 ENCOUNTER — Encounter: Payer: Self-pay | Admitting: Internal Medicine

## 2016-04-12 ENCOUNTER — Other Ambulatory Visit (INDEPENDENT_AMBULATORY_CARE_PROVIDER_SITE_OTHER): Payer: Medicare Other

## 2016-04-12 VITALS — BP 118/74 | HR 59 | Temp 97.9°F | Ht 71.0 in | Wt 222.0 lb

## 2016-04-12 DIAGNOSIS — E1140 Type 2 diabetes mellitus with diabetic neuropathy, unspecified: Secondary | ICD-10-CM

## 2016-04-12 DIAGNOSIS — E785 Hyperlipidemia, unspecified: Secondary | ICD-10-CM

## 2016-04-12 DIAGNOSIS — IMO0002 Reserved for concepts with insufficient information to code with codable children: Secondary | ICD-10-CM

## 2016-04-12 DIAGNOSIS — I2510 Atherosclerotic heart disease of native coronary artery without angina pectoris: Secondary | ICD-10-CM | POA: Diagnosis not present

## 2016-04-12 DIAGNOSIS — E1165 Type 2 diabetes mellitus with hyperglycemia: Secondary | ICD-10-CM

## 2016-04-12 DIAGNOSIS — K219 Gastro-esophageal reflux disease without esophagitis: Secondary | ICD-10-CM | POA: Diagnosis not present

## 2016-04-12 DIAGNOSIS — I10 Essential (primary) hypertension: Secondary | ICD-10-CM

## 2016-04-12 DIAGNOSIS — J069 Acute upper respiratory infection, unspecified: Secondary | ICD-10-CM

## 2016-04-12 LAB — COMPREHENSIVE METABOLIC PANEL
ALT: 21 U/L (ref 0–53)
AST: 21 U/L (ref 0–37)
Albumin: 4.5 g/dL (ref 3.5–5.2)
Alkaline Phosphatase: 63 U/L (ref 39–117)
BUN: 29 mg/dL — ABNORMAL HIGH (ref 6–23)
CO2: 31 mEq/L (ref 19–32)
Calcium: 9.8 mg/dL (ref 8.4–10.5)
Chloride: 102 mEq/L (ref 96–112)
Creatinine, Ser: 1.63 mg/dL — ABNORMAL HIGH (ref 0.40–1.50)
GFR: 54.64 mL/min — ABNORMAL LOW (ref >60.00)
Glucose, Bld: 158 mg/dL — ABNORMAL HIGH (ref 70–99)
Potassium: 4.4 mEq/L (ref 3.5–5.1)
Sodium: 137 mEq/L (ref 135–145)
Total Bilirubin: 0.6 mg/dL (ref 0.2–1.2)
Total Protein: 8.1 g/dL (ref 6.0–8.3)

## 2016-04-12 LAB — HEMOGLOBIN A1C: Hgb A1c MFr Bld: 8 % — ABNORMAL HIGH (ref 4.6–6.5)

## 2016-04-12 MED ORDER — DOXYCYCLINE HYCLATE 100 MG PO TABS: 100.0000 mg | ORAL_TABLET | Freq: Two times a day (BID) | ORAL | Status: DC

## 2016-04-12 NOTE — Assessment & Plan Note (Signed)
Check A1c Needs to revise diet-eating pastries in juice He is exercising regularly

## 2016-04-12 NOTE — Progress Notes (Signed)
Pre visit review using our clinic review tool, if applicable. No additional management support is needed unless otherwise documented below in the visit note. 

## 2016-04-12 NOTE — Patient Instructions (Signed)
  Test(s) ordered today. Your results will be released to [place] (or called to you) after review, usually within 72hours after test completion. If any changes need to be made, you will be notified at that same time.   Medications reviewed and updated.  No changes recommended at this time. We will start an antibiotic for your cold symptoms.     Please followup in 4 months

## 2016-04-12 NOTE — Assessment & Plan Note (Signed)
Cholesterol has been well controlled with current dose of pravastatin Continue 20 mg pravastatin daily

## 2016-04-12 NOTE — Assessment & Plan Note (Signed)
BP well controlled Current regimen effective and well tolerated Continue current medications at current doses CMP 

## 2016-04-12 NOTE — Assessment & Plan Note (Signed)
GERD controlled Continue daily medication  

## 2016-04-12 NOTE — Progress Notes (Signed)
Subjective:    Patient ID: [name], male    DOB: April 27, 1951, 65 y.o.   MRN: [id number]  HPI He is here for follow up.  Cough:  He is still having a cough and it is productive of yellow phlegm.  He sometimes gasps when he tries to talk.  He does have some wheeze and sob. He denies any fevers. He does have a mild sore throat.  Hypertension: He is taking his medication daily - his medication was adjusted recently by cardiology and his BP at home has been better controlled since then. He is compliant with a low sodium diet.  He is exercising regularly.    Diabetes: He is taking his medication daily as prescribed. He is not compliant with a diabetic diet - he eats pastry and juice. He is exercising regularly - three days a week. He monitors his sugars and they have been running 81-189. He checks his feet daily and denies foot lesions. He is up-to-date with an ophthalmology examination.   CVA with residual left sided weakness and peripheral neuropathy:  He is taking his plavix and statin daily.    Hyperlipidemia: He is taking his medication daily. He is compliant with a low fat/cholesterol diet. He is exercising regularly. He denies myalgias.   GERD:  He is taking his medication daily as prescribed.  He denies any GERD symptoms and feels his GERD is well controlled.    Medications and allergies reviewed with patient and updated if appropriate.  Patient Active Problem List   Diagnosis Date Noted  . LBBB (left bundle branch block) 02/02/2016  . Nonallopathic lesion of lumbosacral region 12/11/2014  . Nonallopathic lesion of sacral region 12/11/2014  . Nonallopathic lesion of thoracic region 12/11/2014  . Arthritis of left hip 11/20/2014  . Ischial bursitis of left side 10/28/2014  . Hamstring tightness of left lower extremity 09/16/2014  . Piriformis syndrome of left side 09/16/2014  . Dysphonia 06/22/2013  . Hemiparesis affecting left side as late effect of stroke ([place])  10/10/2012  . Dysphagia following cerebrovascular accident 08/14/2012  . Chronic back pain   . Peripheral neuropathy ([place])   . TIA (transient ischemic attack) 04/08/2012  . Stroke ([place]) 02/21/2012  . [place], RIGHT 05/29/2010  . Type 2 diabetes, uncontrolled, with neuropathy ([place]) 04/13/2010  . ALLERGIC RHINITIS 04/13/2010  . GERD 04/13/2010  . TOBACCO USE, QUIT 04/13/2010  . ERECTILE DYSFUNCTION 03/12/2009  . HYPERTENSION, BENIGN 03/12/2009  . CAD, NATIVE VESSEL 03/12/2009  . Hyperlipidemia 03/07/2009    Current Outpatient Prescriptions on File Prior to Visit  Medication Sig Dispense Refill  . atenolol (TENORMIN) 50 MG tablet Take 1 tablet (50 mg total) by mouth daily. 90 tablet 3  . cetirizine (ZYRTEC) 10 MG tablet Take 10 mg by mouth as needed.    . chlorthalidone (HYGROTON) 25 MG tablet Take 1 tablet (25 mg total) by mouth daily. 30 tablet 3  . clopidogrel (PLAVIX) 75 MG tablet Take 1 tablet (75 mg total) by mouth daily. 90 tablet 2  . esomeprazole (NEXIUM) 40 MG capsule Take 1 capsule (40 mg total) by mouth daily. 90 capsule 2  . glucose blood (PRODIGY NO CODING BLOOD GLUC) test strip Use one strip to test blood sugar four times a day Dx 250.62 100 each 11  . Insulin Glargine (LANTUS SOLOSTAR) 100 UNIT/ML Solostar Pen Inject 30 Units into the skin daily at 10 pm. 5 pen PRN  . Insulin Pen Needle (ULTICARE SHORT PEN NEEDLES) 31G  X 8 MM MISC USE AS DIRECTED 5 TIMES DAILY 150 each 0  . pravastatin (PRAVACHOL) 20 MG tablet Take 1 tablet (20 mg total) by mouth daily. 90 tablet 3  . PRODIGY TWIST TOP LANCETS 28G MISC USE ONE LANCET TO CHECK GLUCOSE 4 TIMES DAILY 120 each 5  . senna-docusate (SENOKOT-S) 8.6-50 MG per tablet Take 2 tablets by mouth 2 (two) times daily. For constipation.    . valsartan (DIOVAN) 320 MG tablet Take 1 tablet (320 mg total) by mouth daily. 90 tablet 1  . benzonatate (TESSALON) 200 MG capsule Take 1 capsule (200 mg total) by mouth every 6 (six) hours as  needed for cough. (Patient not taking: Reported on 04/12/2016) 20 capsule 0   No current facility-administered medications on file prior to visit.    Past Medical History  Diagnosis Date  . ALLERGIC RHINITIS   . CAD, NATIVE VESSEL     BMS to OM1 2001, DES to BMS 2005  . DIAB W/UNSPEC COMP TYPE II/UNSPEC TYPE UNCNTRL   . ERECTILE DYSFUNCTION   . HYPERLIPIDEMIA-MIXED   . MORTON'S NEUROMA, RIGHT   . SHOULDER PAIN, RIGHT   . HYPERTENSION, BENIGN   . Peripheral neuropathy ([place])   . Chronic back pain     herniated disc  . GERD   . Constipation     takes Carafate four times day  . Seasonal allergies     takes Allegra and Benadryl daily prn;uses Flonase daily  . TIA on medication 02/2012  . 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 LBBB (left bundle branch block) 02/02/2016    Past Surgical History  Procedure Laterality Date  . Stent  2001, 2004    coronary stents  . Angioplasty    . Left knee surgury      x 2   . Dental surgery    . Coronary angioplasty  2005    2 stents  . Colonoscopy    . Laryngoplasty  08/07/2012    Procedure: LARYNGOPLASTY;  Surgeon: Izora Gala, MD;  Location: [facility];  Service: ENT;  Laterality: Left;  Left Vocal Cord Medialyzation    Social History   Social History  . Marital Status: Legally Separated    Spouse Name: N/A  . Number of Children: 3  . Years of Education: college   Occupational History  . disabled    Social History Main Topics  . Smoking status: Former Smoker    Quit date: 02/24/1983  . Smokeless tobacco: Never Used  . Alcohol Use: No  . Drug Use: No  . Sexual Activity: No   Other Topics Concern  . None   Social History Narrative   Married, lives in [place] with wife. He is disabled secondary to back pain. 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   Patient has 3 children.   Patient has college education.   Patient drinks 1 cup daily.    Family History  Problem Relation  Age of Onset  . Cancer Mother     Review of Systems  Constitutional: Positive for diaphoresis (at night). Negative for fever.  HENT: Positive for sore throat. Negative for ear pain.   Respiratory: Positive for cough, shortness of breath and wheezing.   Cardiovascular: Positive for chest pain and leg swelling. Negative for palpitations.  Gastrointestinal: Negative for abdominal pain.       No gerd  Neurological: Positive for light-headedness (with  changing position) and headaches.       Objective:   Filed Vitals:   04/12/16 1054  BP: 118/74  Pulse: 59  Temp: 97.9 F (36.6 C)   Filed Weights   04/12/16 1054  Weight: 222 lb (100.699 kg)   Body mass index is 30.98 kg/(m^2).   Physical Exam GENERAL APPEARANCE: Appears stated age, well appearing, NAD EYES: conjunctiva clear, no icterus HEENT: bilateral tympanic membranes and ear canals normal, oropharynx with no erythema, no thyromegaly, trachea midline, no cervical or supraclavicular lymphadenopathy LUNGS: Clear to auscultation without wheeze or crackles, unlabored breathing, good air entry bilaterally HEART: Normal S1,S2 without murmurs EXTREMITIES: Without clubbing, cyanosis, or edema       Assessment & Plan:   URI Persistent with productive cough, mild shortness of breath and wheeze We'll go ahead and treat with an antibiotic Doxycycline twice a day Call or return if no improvement  See Problem List for Assessment and Plan of chronic medical problems.

## 2016-04-15 ENCOUNTER — Other Ambulatory Visit: Payer: Self-pay | Admitting: Internal Medicine

## 2016-04-15 MED ORDER — INSULIN GLARGINE 100 UNIT/ML SOLOSTAR PEN: 35.0000 [IU] | PEN_INJECTOR | Freq: Every day | SUBCUTANEOUS | Status: DC

## 2016-04-22 ENCOUNTER — Encounter: Payer: Self-pay | Admitting: Emergency Medicine

## 2016-05-18 ENCOUNTER — Telehealth: Payer: Self-pay | Admitting: *Deleted

## 2016-05-18 MED ORDER — INSULIN GLARGINE 100 UNIT/ML SOLOSTAR PEN: 35.0000 [IU] | PEN_INJECTOR | Freq: Every day | SUBCUTANEOUS | Status: DC

## 2016-05-18 NOTE — Telephone Encounter (Signed)
Pt called back he is requesting rx to be fax to [place] instead @ [phone] Att: Dr. Rondell Reams...[name]

## 2016-05-18 NOTE — Telephone Encounter (Signed)
Rec'd call pt states he is now taking 30 units on his lantus. Needing a updated script sent to [facility] with correct quantity. Inform pt sending electronically...[name]

## 2016-05-18 NOTE — Addendum Note (Signed)
Addended by: Earnstine Regal on: 05/18/2016 03:38 PM   Modules accepted: Orders

## 2016-05-20 MED ORDER — INSULIN GLARGINE 100 UNIT/ML SOLOSTAR PEN: 35.0000 [IU] | PEN_INJECTOR | Freq: Every day | SUBCUTANEOUS | Status: DC

## 2016-05-20 NOTE — Addendum Note (Signed)
Addended by: Earnstine Regal on: 05/20/2016 04:50 PM   Modules accepted: Orders

## 2016-05-20 NOTE — Telephone Encounter (Signed)
Faxed again to # given below...[name]

## 2016-05-20 NOTE — Telephone Encounter (Signed)
Per pt the below fax number is not working.  Please fax to:  [phone] along with the last progress notes

## 2016-06-27 ENCOUNTER — Other Ambulatory Visit: Payer: Self-pay | Admitting: Cardiovascular Disease

## 2016-06-28 NOTE — Telephone Encounter (Signed)
Please review for refill.  

## 2016-06-28 NOTE — Progress Notes (Signed)
Cardiology Office Note   Date:  06/29/2016   ID:  [name], DOB 1951-09-27, MRN [id number]  PCP:  Binnie Rail, MD  Cardiologist:   Skeet Latch, MD   No chief complaint on file.    History of Present Illness: [name] is a 65 y.o. male with hypertension, CAD status post PCI, stroke, diabetes mellitus type 2, hyperlipidemia, and prior tobacco abuse who presents for follow up.  [name] was referred by Dr. Billey Gosling on 02/09/16.  At that time he reported occasional episodes of chest pain relieved by aspirin.  He denied exertional chest pain but did note dizziness and nausea.  Additionally, he was noted to have a left bundle branch block that was not present on his last EKG in 2013. Therefore, he was referred for exercise Myoview 02/13/16 that revealed LVEF 39% with global hypokinesis but no ischemia.  Echo 03/01/16 revealed LV 40-45% with mild LVH and grade 2 diastolic dysfunction. There was also mild hypokinesis of the mid to apical anteroseptum.  [name] has been working with our pharmacist Tommy Medal due to poorly-controlled hypertension.  His HCTZ was increased and then switched to chlorthalidone.  Amlodipine and spironolactone were added.  His blood pressure has been better controlled since making this change.  He is in a program at the [place] that checks his BP daily.   He continues to work out 3 times per week with a Physiological scientist.  He denies chest pain or shortness of breath with this activity.  He is eating a gluten free diet.  He denies lower extremity edema, orthopnea or PND.  [name] is a veteran who also receives care through the [facility].   Past Medical History:  Diagnosis Date  . ALLERGIC RHINITIS   . CAD, NATIVE VESSEL    BMS to OM1 2001, DES to BMS 2005  . Chronic back pain    herniated disc  . Chronic combined systolic and diastolic heart failure ([place]) 06/29/2016  . Constipation    takes Carafate four times day  . DIAB  W/UNSPEC COMP TYPE II/UNSPEC TYPE UNCNTRL   . ERECTILE DYSFUNCTION   . GERD   . HYPERLIPIDEMIA-MIXED   . HYPERTENSION, BENIGN   . LBBB (left bundle branch block) 02/02/2016  . MORTON'S NEUROMA, RIGHT   . Peripheral neuropathy ([place])   . Seasonal allergies    takes Allegra and Benadryl daily prn;uses Flonase daily  . SHOULDER PAIN, RIGHT   . 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 TIA on medication 02/2012    Past Surgical History:  Procedure Laterality Date  . ANGIOPLASTY    . COLONOSCOPY    . CORONARY ANGIOPLASTY  2005   2 stents  . DENTAL SURGERY    . LARYNGOPLASTY  08/07/2012   Procedure: LARYNGOPLASTY;  Surgeon: Izora Gala, MD;  Location: [place];  Service: ENT;  Laterality: Left;  Left Vocal Cord Medialyzation  . left knee surgury     x 2   . stent  2001, 2004   coronary stents     Current Outpatient Prescriptions  Medication Sig Dispense Refill  . atenolol (TENORMIN) 50 MG tablet Take 1 tablet (50 mg total) by mouth daily. 90 tablet 3  . cetirizine (ZYRTEC) 10 MG tablet Take 10 mg by mouth as needed.    . chlorthalidone (HYGROTON) 25 MG tablet Take 1 tablet (25 mg total) by mouth daily. 30 tablet 3  .  clopidogrel (PLAVIX) 75 MG tablet Take 1 tablet (75 mg total) by mouth daily. 90 tablet 2  . doxycycline (VIBRA-TABS) 100 MG tablet Take 1 tablet (100 mg total) by mouth 2 (two) times daily. 20 tablet 0  . esomeprazole (NEXIUM) 40 MG capsule Take 1 capsule (40 mg total) by mouth daily. 90 capsule 3  . glucose blood (PRODIGY NO CODING BLOOD GLUC) test strip Use one strip to test blood sugar four times a day Dx 250.62 100 each 11  . Insulin Glargine (LANTUS SOLOSTAR) 100 UNIT/ML Solostar Pen Inject 35 Units into the skin daily at 10 pm. 7 pen 5  . Insulin Pen Needle (ULTICARE SHORT PEN NEEDLES) 31G X 8 MM MISC USE AS DIRECTED 5 TIMES DAILY 150 each 0  . pravastatin (PRAVACHOL) 20 MG tablet Take 1 tablet (20 mg total) by mouth daily. 90 tablet 3  .  PRODIGY TWIST TOP LANCETS 28G MISC USE ONE LANCET TO CHECK GLUCOSE 4 TIMES DAILY 120 each 5  . senna-docusate (SENOKOT-S) 8.6-50 MG per tablet Take 2 tablets by mouth 2 (two) times daily. For constipation.    . valsartan (DIOVAN) 320 MG tablet Take 1 tablet (320 mg total) by mouth daily. 90 tablet 1   No current facility-administered medications for this visit.     Allergies:   Penicillins; Pneumococcal vaccines; Shellfish allergy; Influenza vaccines; Lisinopril; and Topiramate    Social History:  The patient  reports that he quit smoking about 33 years ago. He has never used smokeless tobacco. He reports that he does not drink alcohol or use drugs.   Family History:  The patient's family history includes Cancer in his mother.    ROS:  Please see the history of present illness.   Otherwise, review of systems are positive for L side weakness since his stroke and cough.  All other systems are reviewed and negative.    PHYSICAL EXAM: VS:  BP 132/84   Pulse 68   Ht 5\' 11"  ([id number] m)   Wt 220 lb 6.4 oz (100 kg)   SpO2 99%   BMI 30.74 kg/m  , BMI Body mass index is 30.74 kg/m. GENERAL:  Well appearing HEENT:  Pupils equal round and reactive, fundi not visualized, oral mucosa unremarkable NECK:  No jugular venous distention, waveform within normal limits, carotid upstroke brisk and symmetric, no bruits, no thyromegaly LYMPHATICS:  No cervical adenopathy LUNGS:  Clear to auscultation bilaterally HEART:  RRR.  PMI not displaced or sustained,S1 and S2 within normal limits, no S3, no S4, no clicks, no rubs, nourmurs ABD:  Flat, positive bowel sounds normal in frequency in pitch, no bruits, no rebound, no guarding, no midline pulsatile mass, no hepatomegaly, no splenomegaly EXT:  2 plus pulses throughout, no edema, no cyanosis no clubbing SKIN:  No rashes no nodules NEURO:  Cranial nerves II through XII grossly intact, motor grossly intact throughout PSYCH:  Cognitively intact, oriented to  person place and time   EKG:  EKG is ordered today. The ekg ordered today demonstrates sinus rhythm rate 64 bpm.  LBBB.    Echo 03/01/16: Study Conclusions  - Left ventricle: The cavity size was normal. Wall thickness was   increased in a pattern of mild LVH. There was focal basal   hypertrophy. Systolic function was mildly to moderately reduced.   The estimated ejection fraction was in the range of 40% to 45%.   Mild hypokinesis of the mid-apicalanteroseptal myocardium.   Features are consistent with a pseudonormal left  ventricular   filling pattern, with concomitant abnormal relaxation and   increased filling pressure (grade 2 diastolic dysfunction).  Carotid Doppler 10/24/14: Normal study  Lexiscan Myoview 02/13/16:  The left ventricular ejection fraction is moderately decreased (30-44%). Asynchronous contractile performance  Nuclear stress EF: 39%.  There was no ST segment deviation noted during stress.  This is an intermediate risk study. No ischemia identified. Ejection fraction is reduced when compared to prior study in 2013 (47%).  There is a rate related interventricular conduction delay, left bundle branch block.  Baseline ECG exhibits normal sinus rhythm.T-wave inversion 1, aVL, V1, V2 .  Recent Labs: 08/07/2015: Hemoglobin 13.5; Platelets 230.0 04/12/2016: ALT 21; BUN 29; Creatinine, Ser 1.63; Potassium 4.4; Sodium 137    Lipid Panel    Component Value Date/Time   CHOL 170 01/12/2016 0931   TRIG 215.0 (H) 01/12/2016 0931   TRIG 200 04/20/2009   HDL 44.30 01/12/2016 0931   CHOLHDL 4 01/12/2016 0931   VLDL 43.0 (H) 01/12/2016 0931   LDLCALC 77 08/07/2015 1117   LDLDIRECT 63.0 01/12/2016 0931      Wt Readings from Last 3 Encounters:  06/29/16 220 lb 6.4 oz (100 kg)  04/12/16 222 lb (100.7 kg)  03/23/16 225 lb 12.8 oz (102.4 kg)      ASSESSMENT AND PLAN:  # CAD s/p PCI x2: Stable.  Continue plavix, atenolol, and pravastatin.  Stress was negative for  ischemia  # Chronic systolic and diastolic heart failure: [name] is euvolemic.  Continue atenolol and valsartan.  Consider switching atenolol to carvedilol for heart failure.  # Hypertension: BP is well-controlled on atenolol, chlorthalidone, and valsartan.  # Hyperlipidemia: Lipids well-controlled.  Continue pravastatin.  # Cough: Resolved off ACE-I.    Current medicines are reviewed at length with the patient today.  The patient does not have concerns regarding medicines.  The following changes have been made: No changes  Labs/ tests ordered today include:    No orders of the defined types were placed in this encounter.    Disposition:   FU with Shalina Norfolk C. Oval Linsey, MD, [facility] in 1 year   This note was written with the assistance of speech recognition software.  Please excuse any transcriptional errors.  Signed, Shontia Gillooly C. Oval Linsey, MD, [facility]  06/29/2016 11:03 AM    [place]

## 2016-06-28 NOTE — Telephone Encounter (Signed)
Please review for refill. Thanks!  

## 2016-06-29 ENCOUNTER — Encounter: Payer: Self-pay | Admitting: Cardiovascular Disease

## 2016-06-29 ENCOUNTER — Ambulatory Visit (INDEPENDENT_AMBULATORY_CARE_PROVIDER_SITE_OTHER): Payer: Medicare Other | Admitting: Cardiovascular Disease

## 2016-06-29 ENCOUNTER — Encounter (INDEPENDENT_AMBULATORY_CARE_PROVIDER_SITE_OTHER): Payer: Self-pay

## 2016-06-29 VITALS — BP 132/84 | HR 68 | Ht 71.0 in | Wt 220.4 lb

## 2016-06-29 DIAGNOSIS — E785 Hyperlipidemia, unspecified: Secondary | ICD-10-CM | POA: Diagnosis not present

## 2016-06-29 DIAGNOSIS — I2510 Atherosclerotic heart disease of native coronary artery without angina pectoris: Secondary | ICD-10-CM | POA: Diagnosis not present

## 2016-06-29 DIAGNOSIS — I10 Essential (primary) hypertension: Secondary | ICD-10-CM

## 2016-06-29 DIAGNOSIS — Z955 Presence of coronary angioplasty implant and graft: Secondary | ICD-10-CM

## 2016-06-29 DIAGNOSIS — I5042 Chronic combined systolic (congestive) and diastolic (congestive) heart failure: Secondary | ICD-10-CM

## 2016-06-29 DIAGNOSIS — I5032 Chronic diastolic (congestive) heart failure: Secondary | ICD-10-CM | POA: Insufficient documentation

## 2016-06-29 HISTORY — DX: Chronic combined systolic (congestive) and diastolic (congestive) heart failure: I50.42

## 2016-06-29 MED ORDER — ESOMEPRAZOLE MAGNESIUM 40 MG PO CPDR: 40.0000 mg | DELAYED_RELEASE_CAPSULE | Freq: Every day | ORAL | 3 refills | Status: DC

## 2016-06-29 NOTE — Patient Instructions (Addendum)

## 2016-07-02 ENCOUNTER — Other Ambulatory Visit: Payer: Self-pay | Admitting: Cardiovascular Disease

## 2016-07-05 NOTE — Telephone Encounter (Signed)
Review for refill, Thank you. 

## 2016-07-19 NOTE — Progress Notes (Addendum)
Subjective:   [name] is a 65 y.o. male who presents for Medicare Annual/Subsequent preventive examination.   HRA assessment completed during this visit with  The Patient was informed that the wellness visit is to identify future health risk and educate and initiate measures that can reduce risk for increased disease through the lifespan.    NO ROS; Medicare Wellness Visit Hx stroke 2013 HTN; medically managed Hyperlipidemia/ medically managed Discussed A1c and fasting;   Coronary angioplasty 2005; 2 stents  A1c 8.0; from 7.6; on lantus 35 units at hs  Cardiologist dropped insulin to 32 units due to low bs  Psychosocial: (mother had cancer)  Lives with brother; d/a from back pain originally Stroke affected left side 2013;    Tobacco: former smoker; quit in 1984;  ETOH/ none   Medications reviewed for issues;  States bs are going down in the am Currently in a [place] program and has to take his sugars and BP every day BS this am 149; past week was between 49 and 70  When he changed his medicine it went back up;  Dr. Quay Burow managed BS; does not go to endocrinologist  Eats a snack at hs sometimes; discussed snack as graham cracker with peanut butter; see if bs keep dropping in the am  Will also try to balance out exercise   BMI: 30/ would like to lose some weight; [facility] tells him he needs to reduce weight  Diet Eats eggs and sausage; hot cereal; cold cereal Lunch; usually eat out; does not eat red meat [place] and fish; no shell food;  Goes to [employer];  Fruits and Vegetables/ eats L and T every day; cucumbers; Likes salads   Dental work: need dental work;  Given resources   Exercise;  goes to [employer] with Physiological scientist;  Does strength exercises; balance work; biking;  60 minute x 3; may add a day for light exercise as silver sneakers etc.    Fall hx; no  Given education on "Fall Prevention in the Home" for more safety tips the patient can apply as  appropriate.   Personal safety issues reviewed:  1.  for risk such as safe community/ no issues  2.  smoke detector/yes 3.  firearms safety if applicable  4. protection when in the sun;  Not in the sun 5. driving safety for seniors or any recent accidents. No accidents    Depression; anxiety or mood issues assessed / denies d  Cognitive screen completed; MMSE documented or assessed for failures or issues with the AD8 screen below:   Ad8 score reviewed for issues;  Issues making decisions; no  Less interest in hobbies / activities" no  Repeats questions, stories; family complaining: NO  Trouble using ordinary gadgets; microwave; computer: no  Forgets the month or year: no  Mismanaging finances: no  Missing apt: no but does write them down  Daily problems with thinking of memory NO Ad8 score is 0  MMSE not appropriate unless AD8 score is > 2   Advanced Directive reviewed for completion or educated regarding [employer] Pontes form; the electing a health care agent and completing the Living Will.   Assessed for Preventive Heath Gaps and completion;  Zostavax/ not going to be taking; seems to be allergic to preservatives  PCV 13/ not a candidate due to allergies  Ophthalmology exam; last one 42015; due in October this year  Foot exam 08/2014 deferred until Sept  Colonoscopy 11/2005; due 11/2015 States he had one; will  have to take him off of plavix and he is concerned May take colo-guard; to discuss with Dr.  Quay Burow at Sept visit    Future due dates reviewed with the patient for accuracy.   Reviewed Vaccines and Immunizations deferred due to allergic response; does not take flu or pneumonia vaccines   Established and updated Risk reviewed and appropriate referral made or health recommendations as appropriate based on individual needs and choices; see plan  Current Care Team reviewed and updated     Cardiac Risk Factors include: advanced age (>54men, >78 women);diabetes  mellitus;dyslipidemia;family history of premature cardiovascular disease;hypertension;male gender;obesity (BMI >30kg/m2)     Objective:    Vitals: BP 120/80   Pulse 65   Temp 98.1 F (36.7 C)   Ht 5\' 11"  (1.803 m)   Wt 221 lb 8 oz (100.5 kg)   SpO2 98%   BMI 30.89 kg/m   Body mass index is 30.89 kg/m.  Tobacco History  Smoking Status  . Former Smoker  . Quit date: 02/24/1983  Smokeless Tobacco  . Never Used     Counseling given: Not Answered   Past Medical History:  Diagnosis Date  . ALLERGIC RHINITIS   . CAD, NATIVE VESSEL    BMS to OM1 2001, DES to BMS 2005  . Chronic back pain    herniated disc  . Chronic combined systolic and diastolic heart failure ([place]) 06/29/2016  . Constipation    takes Carafate four times day  . DIAB W/UNSPEC COMP TYPE II/UNSPEC TYPE UNCNTRL   . ERECTILE DYSFUNCTION   . GERD   . HYPERLIPIDEMIA-MIXED   . HYPERTENSION, BENIGN   . LBBB (left bundle branch block) 02/02/2016  . MORTON'S NEUROMA, RIGHT   . Peripheral neuropathy ([place])   . Seasonal allergies    takes Allegra and Benadryl daily prn;uses Flonase daily  . SHOULDER PAIN, RIGHT   . 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 TIA on medication 02/2012   Past Surgical History:  Procedure Laterality Date  . ANGIOPLASTY    . COLONOSCOPY    . CORONARY ANGIOPLASTY  2005   2 stents  . DENTAL SURGERY    . LARYNGOPLASTY  08/07/2012   Procedure: LARYNGOPLASTY;  Surgeon: Izora Gala, MD;  Location: [place];  Service: ENT;  Laterality: Left;  Left Vocal Cord Medialyzation  . left knee surgury     x 2   . stent  2001, 2004   coronary stents   Family History  Problem Relation Age of Onset  . Cancer Mother    History  Sexual Activity  . Sexual activity: No    Outpatient Encounter Prescriptions as of 07/20/2016  Medication Sig  . amLODipine (NORVASC) 2.5 MG tablet TAKE ONE TABLET BY MOUTH ONCE DAILY  . atenolol (TENORMIN) 50 MG tablet Take 1 tablet (50 mg total)  by mouth daily.  . cetirizine (ZYRTEC) 10 MG tablet Take 10 mg by mouth as needed.  . chlorthalidone (HYGROTON) 25 MG tablet Take 1 tablet (25 mg total) by mouth daily.  . clopidogrel (PLAVIX) 75 MG tablet Take 1 tablet (75 mg total) by mouth daily.  Marland Kitchen esomeprazole (NEXIUM) 40 MG capsule Take 1 capsule (40 mg total) by mouth daily.  Marland Kitchen glucose blood (PRODIGY NO CODING BLOOD GLUC) test strip Use one strip to test blood sugar four times a day Dx 250.62  . Insulin Glargine (LANTUS SOLOSTAR) 100 UNIT/ML Solostar Pen Inject 35 Units into the skin  daily at 10 pm.  . Insulin Pen Needle (ULTICARE SHORT PEN NEEDLES) 31G X 8 MM MISC USE AS DIRECTED 5 TIMES DAILY  . pravastatin (PRAVACHOL) 20 MG tablet Take 1 tablet (20 mg total) by mouth daily.  Marland Kitchen PRODIGY TWIST TOP LANCETS 28G MISC USE ONE LANCET TO CHECK GLUCOSE 4 TIMES DAILY  . senna-docusate (SENOKOT-S) 8.6-50 MG per tablet Take 2 tablets by mouth 2 (two) times daily. For constipation.  . valsartan (DIOVAN) 320 MG tablet Take 1 tablet (320 mg total) by mouth daily.  . [DISCONTINUED] doxycycline (VIBRA-TABS) 100 MG tablet Take 1 tablet (100 mg total) by mouth 2 (two) times daily. (Patient not taking: Reported on 07/20/2016)   No facility-administered encounter medications on file as of 07/20/2016.     Activities of Daily Living In your present state of health, do you have any difficulty performing the following activities: 07/20/2016  Hearing? N  Vision? N  Difficulty concentrating or making decisions? N  Walking or climbing stairs? Y  Dressing or bathing? N  Doing errands, shopping? N  Preparing Food and eating ? N  Using the Toilet? N  In the past six months, have you accidently leaked urine? N  Do you have problems with loss of bowel control? N  Managing your Medications? N  Managing your Finances? N  Housekeeping or managing your Housekeeping? N  Some recent data might be hidden    Patient Care Team: Binnie Rail, MD as PCP - General  (Internal Medicine) Thayer Headings, MD (Cardiology) Renato Shin, MD (Endocrinology) Izora Gala, MD (Otolaryngology) Garvin Fila, MD (Neurology) Almedia Balls, MD (Orthopedic Surgery) Sable Feil, MD (Gastroenterology) Wynona Canes, MD as Consulting Physician (Neurology)   Assessment:     Exercise Activities and Dietary recommendations Current Exercise Habits: Structured exercise class, Time (Minutes): > 60, Frequency (Times/Week): 3, Weekly Exercise (Minutes/Week): 0, Intensity: Moderate  Goals    . Reduce sugar intake to X grams per day          Check out  online nutrition programs as GumSearch.nl and http://vang.com/; fit32me; or calorieking.com Look for foods with "whole" wheat; bran; oatmeal etc Shot at the farmer's markets in season for fresher choices  Watch for "hydrogenated" on the label of oils which are trans-fats.  Watch for "high fructose corn syrup" in snacks, yogurt or ketchup  Meats have less marbling; bright colored fruits and vegetables;  Canned; dump out liquid and wash vegetables. Be mindful of what we are eating  Portion control is essential to a health weight! Sit down; take a break and enjoy your meal; take smaller bites; put the fork down between bites;  It takes 20 minutes to get full; so check in with your fullness cues and stop eating when you start to fill full             Fall Risk Fall Risk  07/20/2016 09/09/2014  Falls in the past year? No No   Depression Screen PHQ 2/9 Scores 07/20/2016 09/09/2014  PHQ - 2 Score 0 1    Cognitive Testing MMSE - Mini Mental State Exam 07/20/2016  Not completed: (No Data)    Immunization History  Administered Date(s) Administered  . Td 11/22/2004  . Tdap 11/30/2011  . Tetanus 07/13/2012   Screening Tests Health Maintenance  Topic Date Due  . ZOSTAVAX  11/10/2009  . FOOT EXAM  09/10/2015  . COLONOSCOPY  11/23/2015  . OPHTHALMOLOGY EXAM  09/21/2016 (Originally 02/21/2015)  . INFLUENZA  VACCINE  06/22/2017 (Originally 06/22/2016)  . HEMOGLOBIN A1C  10/13/2016  . TETANUS/TDAP  07/13/2022  . Hepatitis C Screening  Completed      Plan:     Discussed insulin; try balance exercise, track diet on one of the sites and come in for up if bs keep dropping Will track insulin; fasting; diet and exercise on a daily bases; Bring a copy when you come in to see Dr. Quay Burow   Request urologist; ED fup  Given some resources for dental   Can discuss shingles with Dr. Quay Burow due to allergies; may defer  Deferred pneumococcal vaccines due to high allergies risk   Will discuss colo-guard with Dr. Quay Burow as an option for colonoscopy  due to concerns about coming off plavix    During the course of the visit the patient was educated and counseled about the following appropriate screening and preventive services:   Vaccines to include Pneumoccal, Influenza, Hepatitis B, Td, Zostavax, HCV/   Electrocardiogram/ 01/2016  Cardiovascular Disease/   Colorectal cancer screening due  Diabetes screening/ elevated 8   Prostate Cancer Screening  Glaucoma screening  Nutrition counseling   Smoking cessation counseling  Patient Instructions (the written plan) was given to the patient.    [id number], RN  07/20/2016   Medical screening examination/treatment/procedure(s) were performed by non-physician practitioner and as supervising physician I was immediately available for consultation/collaboration. I agree with above. Binnie Rail, MD

## 2016-07-20 ENCOUNTER — Ambulatory Visit (INDEPENDENT_AMBULATORY_CARE_PROVIDER_SITE_OTHER): Payer: Medicare Other

## 2016-07-20 VITALS — BP 120/80 | HR 65 | Temp 98.1°F | Ht 71.0 in | Wt 221.5 lb

## 2016-07-20 DIAGNOSIS — Z0000 Encounter for general adult medical examination without abnormal findings: Secondary | ICD-10-CM

## 2016-07-20 NOTE — Patient Instructions (Addendum)
[name] , Thank you for taking time to come for your Medicare Wellness Visit. I appreciate your ongoing commitment to your health goals. Please review the following plan we discussed and let me know if I can assist you in the future.   Request urologist; ED fup'; will wait until Sept visit   Given some resources for dental   Can discuss shingles with Dr. Quay Burow due to allergies; may defer  Deferred flu vaccine due to high allergy risk  Deferred pneumococcal vaccines due to high allergies risk   Will discuss colo-guard with Dr. Quay Burow as an option for colonoscopy  due to concerns about coming off plavix     These are the goals we discussed: Goals    . Reduce sugar intake to X grams per day          Check out  online nutrition programs as GumSearch.nl and http://vang.com/; fit30me; or calorieking.com Look for foods with "whole" wheat; bran; oatmeal etc Shot at the farmer's markets in season for fresher choices  Watch for "hydrogenated" on the label of oils which are trans-fats.  Watch for "high fructose corn syrup" in snacks, yogurt or ketchup  Meats have less marbling; bright colored fruits and vegetables;  Canned; dump out liquid and wash vegetables. Be mindful of what we are eating  Portion control is essential to a health weight! Sit down; take a break and enjoy your meal; take smaller bites; put the fork down between bites;  It takes 20 minutes to get full; so check in with your fullness cues and stop eating when you start to fill full              This is a list of the screening recommended for you and due dates:  Health Maintenance  Topic Date Due  . Shingles Vaccine  11/10/2009  . Pneumonia vaccines (1 of 2 - PCV13) 11/10/2014  . Eye exam for diabetics  02/21/2015  . Complete foot exam   09/10/2015  . Colon Cancer Screening  11/23/2015  . Flu Shot  06/22/2017*  . Hemoglobin A1C  10/13/2016  . Tetanus Vaccine  07/13/2022  .  Hepatitis C: One time  screening is recommended by Center for Disease Control  (CDC) for  adults born from [birth year] through 1967.   Completed  *Topic was postponed. The date shown is not the original due date.   '    Colonoscopy A colonoscopy is an exam to look at the entire large intestine (colon). This exam can help find problems such as tumors, polyps, inflammation, and areas of bleeding. The exam takes about 1 hour.  LET [facility] CARE PROVIDER KNOW ABOUT:   Any allergies you have.  All medicines you are taking, including vitamins, herbs, eye drops, creams, and over-the-counter medicines.  Previous problems you or members of your family have had with the use of anesthetics.  Any blood disorders you have.  Previous surgeries you have had.  Medical conditions you have. RISKS AND COMPLICATIONS  Generally, this is a safe procedure. However, as with any procedure, complications can occur. Possible complications include:  Bleeding.  Tearing or rupture of the colon wall.  Reaction to medicines given during the exam.  Infection (rare). BEFORE THE PROCEDURE   Ask your health care provider about changing or stopping your regular medicines.  You may be prescribed an oral bowel prep. This involves drinking a large amount of medicated liquid, starting the day before your procedure. The liquid will cause you  to have multiple loose stools until your stool is almost clear or light green. This cleans out your colon in preparation for the procedure.  Do not eat or drink anything else once you have started the bowel prep, unless your health care provider tells you it is safe to do so.  Arrange for someone to drive you home after the procedure. PROCEDURE   You will be given medicine to help you relax (sedative).  You will lie on your side with your knees bent.  A long, flexible tube with a light and camera on the end (colonoscope) will be inserted through the rectum and into the colon. The camera sends video  back to a computer screen as it moves through the colon. The colonoscope also releases carbon dioxide gas to inflate the colon. This helps your health care provider see the area better.  During the exam, your health care provider may take a small tissue sample (biopsy) to be examined under a microscope if any abnormalities are found.  The exam is finished when the entire colon has been viewed. AFTER THE PROCEDURE   Do not drive for 24 hours after the exam.  You may have a small amount of blood in your stool.  You may pass moderate amounts of gas and have mild abdominal cramping or bloating. This is caused by the gas used to inflate your colon during the exam.  Ask when your test results will be ready and how you will get your results. Make sure you get your test results.   This information is not intended to replace advice given to you by your health care provider. Make sure you discuss any questions you have with your health care provider.   Document Released: 11/05/2000 Document Revised: 08/29/2013 Document Reviewed: 07/16/2013 Elsevier Interactive Patient Education 2016 [employer]  Fat and Cholesterol Restricted Diet High levels of fat and cholesterol in your blood may lead to various health problems, such as diseases of the heart, blood vessels, gallbladder, liver, and pancreas. Fats are concentrated sources of energy that come in various forms. Certain types of fat, including saturated fat, may be harmful in excess. Cholesterol is a substance needed by your body in small amounts. Your body makes all the cholesterol it needs. Excess cholesterol comes from the food you eat. When you have high levels of cholesterol and saturated fat in your blood, health problems can develop because the excess fat and cholesterol will gather along the walls of your blood vessels, causing them to narrow. Choosing the right foods will help you control your intake of fat and cholesterol. This will help keep  the levels of these substances in your blood within normal limits and reduce your risk of disease. WHAT IS MY PLAN? Your health care provider recommends that you:  Get no more than __________ % of the total calories in your daily diet from fat.  Limit your intake of saturated fat to less than ______% of your total calories each day.  Limit the amount of cholesterol in your diet to less than _________mg per day. WHAT TYPES OF FAT SHOULD I CHOOSE?  Choose healthy fats more often. Choose monounsaturated and polyunsaturated fats, such as olive and canola oil, flaxseeds, walnuts, almonds, and seeds.  Eat more omega-3 fats. Good choices include salmon, mackerel, sardines, tuna, flaxseed oil, and ground flaxseeds. Aim to eat fish at least two times a week.  Limit saturated fats. Saturated fats are primarily found in animal products, such as meats, butter,  and cream. Plant sources of saturated fats include palm oil, palm kernel oil, and coconut oil.  Avoid foods with partially hydrogenated oils in them. These contain trans fats. Examples of foods that contain trans fats are stick margarine, some tub margarines, cookies, crackers, and other baked goods. WHAT GENERAL GUIDELINES DO I NEED TO FOLLOW? These guidelines for healthy eating will help you control your intake of fat and cholesterol:  Check food labels carefully to identify foods with trans fats or high amounts of saturated fat.  Fill one half of your plate with vegetables and green salads.  Fill one fourth of your plate with whole grains. Look for the word "whole" as the first word in the ingredient list.  Fill one fourth of your plate with lean protein foods.  Limit fruit to two servings a day. Choose fruit instead of juice.  Eat more foods that contain soluble fiber. Examples of foods that contain this type of fiber are apples, broccoli, carrots, beans, peas, and barley. Aim to get 20-30 g of fiber per day.  Eat more home-cooked food  and less restaurant, buffet, and fast food.  Limit or avoid alcohol.  Limit foods high in starch and sugar.  Limit fried foods.  Cook foods using methods other than frying. Baking, boiling, grilling, and broiling are all great options.  Lose weight if you are overweight. Losing just 5-10% of your initial body weight can help your overall health and prevent diseases such as diabetes and heart disease. WHAT FOODS CAN I EAT? Grains Whole grains, such as whole wheat or whole grain breads, crackers, cereals, and pasta. Unsweetened oatmeal, bulgur, barley, quinoa, or brown rice. Corn or whole wheat flour tortillas. Vegetables Fresh or frozen vegetables (raw, steamed, roasted, or grilled). Green salads. Fruits All fresh, canned (in natural juice), or frozen fruits. Meat and Other Protein Products Ground beef (85% or leaner), grass-fed beef, or beef trimmed of fat. Skinless chicken or [place]. Ground chicken or [place]. Pork trimmed of fat. All fish and seafood. Eggs. Dried beans, peas, or lentils. Unsalted nuts or seeds. Unsalted canned or dry beans. Dairy Low-fat dairy products, such as skim or 1% milk, 2% or reduced-fat cheeses, low-fat ricotta or cottage cheese, or plain low-fat yogurt. Fats and Oils Tub margarines without trans fats. Light or reduced-fat mayonnaise and salad dressings. Avocado. Olive, canola, sesame, or safflower oils. Natural peanut or almond butter (choose ones without added sugar and oil). The items listed above may not be a complete list of recommended foods or beverages. Contact your dietitian for more options. WHAT FOODS ARE NOT RECOMMENDED? Grains White bread. White pasta. White rice. Cornbread. Bagels, pastries, and croissants. Crackers that contain trans fat. Vegetables White potatoes. Corn. Creamed or fried vegetables. Vegetables in a cheese sauce. Fruits Dried fruits. Canned fruit in light or heavy syrup. Fruit juice. Meat and Other Protein Products Fatty cuts  of meat. Ribs, chicken wings, bacon, sausage, bologna, salami, chitterlings, fatback, hot dogs, bratwurst, and packaged luncheon meats. Liver and organ meats. Dairy Whole or 2% milk, cream, half-and-half, and cream cheese. Whole milk cheeses. Whole-fat or sweetened yogurt. Full-fat cheeses. Nondairy creamers and whipped toppings. Processed cheese, cheese spreads, or cheese curds. Sweets and Desserts Corn syrup, sugars, honey, and molasses. Candy. Jam and jelly. Syrup. Sweetened cereals. Cookies, pies, cakes, donuts, muffins, and ice cream. Fats and Oils Butter, stick margarine, lard, shortening, ghee, or bacon fat. Coconut, palm kernel, or palm oils. Beverages Alcohol. Sweetened drinks (such as sodas, lemonade, and fruit drinks  or punches). The items listed above may not be a complete list of foods and beverages to avoid. Contact your dietitian for more information.   This information is not intended to replace advice given to you by your health care provider. Make sure you discuss any questions you have with your health care provider.   Document Released: 11/08/2005 Document Revised: 11/29/2014 Document Reviewed: 02/06/2014 Elsevier Interactive Patient Education 2016 [place] in the Home  Falls can cause injuries. They can happen to people of all ages. There are many things you can do to make your home safe and to help prevent falls.  WHAT CAN I DO ON THE OUTSIDE OF MY HOME?  Regularly fix the edges of walkways and driveways and fix any cracks.  Remove anything that might make you trip as you walk through a door, such as a raised step or threshold.  Trim any bushes or trees on the path to your home.  Use bright outdoor lighting.  Clear any walking paths of anything that might make someone trip, such as rocks or tools.  Regularly check to see if handrails are loose or broken. Make sure that both sides of any steps have handrails.  Any raised decks and porches  should have guardrails on the edges.  Have any leaves, snow, or ice cleared regularly.  Use sand or salt on walking paths during winter.  Clean up any spills in your garage right away. This includes oil or grease spills. WHAT CAN I DO IN THE BATHROOM?   Use night lights.  Install grab bars by the toilet and in the tub and shower. Do not use towel bars as grab bars.  Use non-skid mats or decals in the tub or shower.  If you need to sit down in the shower, use a plastic, non-slip stool.  Keep the floor dry. Clean up any water that spills on the floor as soon as it happens.  Remove soap buildup in the tub or shower regularly.  Attach bath mats securely with double-sided non-slip rug tape.  Do not have throw rugs and other things on the floor that can make you trip. WHAT CAN I DO IN THE BEDROOM?  Use night lights.  Make sure that you have a light by your bed that is easy to reach.  Do not use any sheets or blankets that are too big for your bed. They should not hang down onto the floor.  Have a firm chair that has side arms. You can use this for support while you get dressed.  Do not have throw rugs and other things on the floor that can make you trip. WHAT CAN I DO IN THE KITCHEN?  Clean up any spills right away.  Avoid walking on wet floors.  Keep items that you use a lot in easy-to-reach places.  If you need to reach something above you, use a strong step stool that has a grab bar.  Keep electrical cords out of the way.  Do not use floor polish or wax that makes floors slippery. If you must use wax, use non-skid floor wax.  Do not have throw rugs and other things on the floor that can make you trip. WHAT CAN I DO WITH MY STAIRS?  Do not leave any items on the stairs.  Make sure that there are handrails on both sides of the stairs and use them. Fix handrails that are broken or loose. Make sure that handrails are as  long as the stairways.  Check any carpeting to  make sure that it is firmly attached to the stairs. Fix any carpet that is loose or worn.  Avoid having throw rugs at the top or bottom of the stairs. If you do have throw rugs, attach them to the floor with carpet tape.  Make sure that you have a light switch at the top of the stairs and the bottom of the stairs. If you do not have them, ask someone to add them for you. WHAT ELSE CAN I DO TO HELP PREVENT FALLS?  Wear shoes that:  Do not have high heels.  Have rubber bottoms.  Are comfortable and fit you well.  Are closed at the toe. Do not wear sandals.  If you use a stepladder:  Make sure that it is fully opened. Do not climb a closed stepladder.  Make sure that both sides of the stepladder are locked into place.  Ask someone to hold it for you, if possible.  Clearly mark and make sure that you can see:  Any grab bars or handrails.  First and last steps.  Where the edge of each step is.  Use tools that help you move around (mobility aids) if they are needed. These include:  Canes.  Walkers.  Scooters.  Crutches.  Turn on the lights when you go into a dark area. Replace any light bulbs as soon as they burn out.  Set up your furniture so you have a clear path. Avoid moving your furniture around.  If any of your floors are uneven, fix them.  If there are any pets around you, be aware of where they are.  Review your medicines with your doctor. Some medicines can make you feel dizzy. This can increase your chance of falling. Ask your doctor what other things that you can do to help prevent falls.   This information is not intended to replace advice given to you by your health care provider. Make sure you discuss any questions you have with your health care provider.   Document Released: 09/04/2009 Document Revised: 03/25/2015 Document Reviewed: 12/13/2014 Elsevier Interactive Patient Education 2016 [place] Maintenance, Male A healthy lifestyle  and preventative care can promote health and wellness.  Maintain regular health, dental, and eye exams.  Eat a healthy diet. Foods like vegetables, fruits, whole grains, low-fat dairy products, and lean protein foods contain the nutrients you need and are low in calories. Decrease your intake of foods high in solid fats, added sugars, and salt. Get information about a proper diet from your health care provider, if necessary.  Regular physical exercise is one of the most important things you can do for your health. Most adults should get at least 150 minutes of moderate-intensity exercise (any activity that increases your heart rate and causes you to sweat) each week. In addition, most adults need muscle-strengthening exercises on 2 or more days a week.   Maintain a healthy weight. The body mass index (BMI) is a screening tool to identify possible weight problems. It provides an estimate of body fat based on height and weight. Your health care provider can find your BMI and can help you achieve or maintain a healthy weight. For males 20 years and older:  A BMI below 18.5 is considered underweight.  A BMI of 18.5 to 24.9 is normal.  A BMI of 25 to 29.9 is considered overweight.  A BMI of 30 and above is considered obese.  Maintain  normal blood lipids and cholesterol by exercising and minimizing your intake of saturated fat. Eat a balanced diet with plenty of fruits and vegetables. Blood tests for lipids and cholesterol should begin at age 72 and be repeated every 5 years. If your lipid or cholesterol levels are high, you are over age 44, or you are at high risk for heart disease, you may need your cholesterol levels checked more frequently.Ongoing high lipid and cholesterol levels should be treated with medicines if diet and exercise are not working.  If you smoke, find out from your health care provider how to quit. If you do not use tobacco, do not start.  Lung cancer screening is recommended  for adults aged 45-80 years who are at high risk for developing lung cancer because of a history of smoking. A yearly low-dose CT scan of the lungs is recommended for people who have at least a 30-pack-year history of smoking and are current smokers or have quit within the past 15 years. A pack year of smoking is smoking an average of 1 pack of cigarettes a day for 1 year (for example, a 30-pack-year history of smoking could mean smoking 1 pack a day for 30 years or 2 packs a day for 15 years). Yearly screening should continue until the smoker has stopped smoking for at least 15 years. Yearly screening should be stopped for people who develop a health problem that would prevent them from having lung cancer treatment.  If you choose to drink alcohol, do not have more than 2 drinks per day. One drink is considered to be 12 oz (360 mL) of beer, 5 oz (150 mL) of wine, or 1.5 oz (45 mL) of liquor.  Avoid the use of street drugs. Do not share needles with anyone. Ask for help if you need support or instructions about stopping the use of drugs.  High blood pressure causes heart disease and increases the risk of stroke. High blood pressure is more likely to develop in:  People who have blood pressure in the end of the normal range (100-139/85-89 mm Hg).  People who are overweight or obese.  People who are African American.  If you are 68-66 years of age, have your blood pressure checked every 3-5 years. If you are 19 years of age or older, have your blood pressure checked every year. You should have your blood pressure measured twice--once when you are at a hospital or clinic, and once when you are not at a hospital or clinic. Record the average of the two measurements. To check your blood pressure when you are not at a hospital or clinic, you can use:  An automated blood pressure machine at a pharmacy.  A home blood pressure monitor.  If you are 56-48 years old, ask your health care provider if you  should take aspirin to prevent heart disease.  Diabetes screening involves taking a blood sample to check your fasting blood sugar level. This should be done once every 3 years after age 40 if you are at a normal weight and without risk factors for diabetes. Testing should be considered at a younger age or be carried out more frequently if you are overweight and have at least 1 risk factor for diabetes.  Colorectal cancer can be detected and often prevented. Most routine colorectal cancer screening begins at the age of 63 and continues through age 9. However, your health care provider may recommend screening at an earlier age if you have risk factors  for colon cancer. On a yearly basis, your health care provider may provide home test kits to check for hidden blood in the stool. A small camera at the end of a tube may be used to directly examine the colon (sigmoidoscopy or colonoscopy) to detect the earliest forms of colorectal cancer. Talk to your health care provider about this at age 100 when routine screening begins. A direct exam of the colon should be repeated every 5-10 years through age 33, unless early forms of precancerous polyps or small growths are found.  People who are at an increased risk for hepatitis B should be screened for this virus. You are considered at high risk for hepatitis B if:  You were born in a country where hepatitis B occurs often. Talk with your health care provider about which countries are considered high risk.  Your parents were born in a high-risk country and you have not received a shot to protect against hepatitis B (hepatitis B vaccine).  You have HIV or AIDS.  You use needles to inject street drugs.  You live with, or have sex with, someone who has hepatitis B.  You are a man who has sex with other men (MSM).  You get hemodialysis treatment.  You take certain medicines for conditions like cancer, organ transplantation, and autoimmune  conditions.  Hepatitis C blood testing is recommended for all people born from [birth year] through 1967 and any individual with known risk factors for hepatitis C.  Healthy men should no longer receive prostate-specific antigen (PSA) blood tests as part of routine cancer screening. Talk to your health care provider about prostate cancer screening.  Testicular cancer screening is not recommended for adolescents or adult males who have no symptoms. Screening includes self-exam, a health care provider exam, and other screening tests. Consult with your health care provider about any symptoms you have or any concerns you have about testicular cancer.  Practice safe sex. Use condoms and avoid high-risk sexual practices to reduce the spread of sexually transmitted infections (STIs).  You should be screened for STIs, including gonorrhea and chlamydia if:  You are sexually active and are younger than 24 years.  You are older than 24 years, and your health care provider tells you that you are at risk for this type of infection.  Your sexual activity has changed since you were last screened, and you are at an increased risk for chlamydia or gonorrhea. Ask your health care provider if you are at risk.  If you are at risk of being infected with HIV, it is recommended that you take a prescription medicine daily to prevent HIV infection. This is called pre-exposure prophylaxis (PrEP). You are considered at risk if:  You are a man who has sex with other men (MSM).  You are a heterosexual man who is sexually active with multiple partners.  You take drugs by injection.  You are sexually active with a partner who has HIV.  Talk with your health care provider about whether you are at high risk of being infected with HIV. If you choose to begin PrEP, you should first be tested for HIV. You should then be tested every 3 months for as long as you are taking PrEP.  Use sunscreen. Apply sunscreen liberally and  repeatedly throughout the day. You should seek shade when your shadow is shorter than you. Protect yourself by wearing long sleeves, pants, a wide-brimmed hat, and sunglasses year round whenever you are outdoors.  Tell your health care  provider of new moles or changes in moles, especially if there is a change in shape or color. Also, tell your health care provider if a mole is larger than the size of a pencil eraser.  A one-time screening for abdominal aortic aneurysm (AAA) and surgical repair of large AAAs by ultrasound is recommended for men aged 51-75 years who are current or former smokers.  Stay current with your vaccines (immunizations).   This information is not intended to replace advice given to you by your health care provider. Make sure you discuss any questions you have with your health care provider.   Document Released: 05/06/2008 Document Revised: 11/29/2014 Document Reviewed: 04/05/2011 Elsevier Interactive Patient Education [employer].

## 2016-07-30 ENCOUNTER — Other Ambulatory Visit: Payer: Self-pay | Admitting: Cardiovascular Disease

## 2016-08-07 ENCOUNTER — Other Ambulatory Visit: Payer: Self-pay | Admitting: Internal Medicine

## 2016-08-17 ENCOUNTER — Encounter: Payer: Self-pay | Admitting: Internal Medicine

## 2016-08-17 ENCOUNTER — Telehealth: Payer: Self-pay | Admitting: Internal Medicine

## 2016-08-17 ENCOUNTER — Other Ambulatory Visit (INDEPENDENT_AMBULATORY_CARE_PROVIDER_SITE_OTHER): Payer: Medicare Other

## 2016-08-17 ENCOUNTER — Ambulatory Visit (INDEPENDENT_AMBULATORY_CARE_PROVIDER_SITE_OTHER): Payer: Medicare Other | Admitting: Internal Medicine

## 2016-08-17 VITALS — BP 126/86 | HR 67 | Temp 98.1°F | Resp 16 | Wt 221.0 lb

## 2016-08-17 DIAGNOSIS — I5042 Chronic combined systolic (congestive) and diastolic (congestive) heart failure: Secondary | ICD-10-CM

## 2016-08-17 DIAGNOSIS — I2510 Atherosclerotic heart disease of native coronary artery without angina pectoris: Secondary | ICD-10-CM

## 2016-08-17 DIAGNOSIS — E1140 Type 2 diabetes mellitus with diabetic neuropathy, unspecified: Secondary | ICD-10-CM

## 2016-08-17 DIAGNOSIS — Z125 Encounter for screening for malignant neoplasm of prostate: Secondary | ICD-10-CM

## 2016-08-17 DIAGNOSIS — J321 Chronic frontal sinusitis: Secondary | ICD-10-CM

## 2016-08-17 DIAGNOSIS — E1165 Type 2 diabetes mellitus with hyperglycemia: Secondary | ICD-10-CM

## 2016-08-17 DIAGNOSIS — I10 Essential (primary) hypertension: Secondary | ICD-10-CM | POA: Diagnosis not present

## 2016-08-17 DIAGNOSIS — IMO0002 Reserved for concepts with insufficient information to code with codable children: Secondary | ICD-10-CM

## 2016-08-17 DIAGNOSIS — I69354 Hemiplegia and hemiparesis following cerebral infarction affecting left non-dominant side: Secondary | ICD-10-CM

## 2016-08-17 DIAGNOSIS — J329 Chronic sinusitis, unspecified: Secondary | ICD-10-CM | POA: Insufficient documentation

## 2016-08-17 DIAGNOSIS — E785 Hyperlipidemia, unspecified: Secondary | ICD-10-CM

## 2016-08-17 DIAGNOSIS — F528 Other sexual dysfunction not due to a substance or known physiological condition: Secondary | ICD-10-CM

## 2016-08-17 LAB — CBC WITH DIFFERENTIAL/PLATELET
Basophils Absolute: 0 10*3/uL (ref 0.0–0.1)
Basophils Relative: 0.4 % (ref 0.0–3.0)
Eosinophils Absolute: 0.3 10*3/uL (ref 0.0–0.7)
Eosinophils Relative: 4 % (ref 0.0–5.0)
HCT: 34.8 % — ABNORMAL LOW (ref 39.0–52.0)
Hemoglobin: 12 g/dL — ABNORMAL LOW (ref 13.0–17.0)
Lymphocytes Relative: 30.1 % (ref 12.0–46.0)
Lymphs Abs: 2.5 10*3/uL (ref 0.7–4.0)
MCHC: 34.5 g/dL (ref 30.0–36.0)
MCV: 90.3 fl (ref 78.0–100.0)
Monocytes Absolute: 0.8 10*3/uL (ref 0.1–1.0)
Monocytes Relative: 9.7 % (ref 3.0–12.0)
Neutro Abs: 4.6 10*3/uL (ref 1.4–7.7)
Neutrophils Relative %: 55.8 % (ref 43.0–77.0)
Platelets: 229 10*3/uL (ref 150.0–400.0)
RBC: 3.85 Mil/uL — ABNORMAL LOW (ref 4.22–5.81)
RDW: 13 % (ref 11.5–15.5)
WBC: 8.2 10*3/uL (ref 4.0–10.5)

## 2016-08-17 LAB — COMPREHENSIVE METABOLIC PANEL
ALT: 14 U/L (ref 0–53)
AST: 16 U/L (ref 0–37)
Albumin: 4 g/dL (ref 3.5–5.2)
Alkaline Phosphatase: 64 U/L (ref 39–117)
BUN: 31 mg/dL — ABNORMAL HIGH (ref 6–23)
CO2: 35 mEq/L — ABNORMAL HIGH (ref 19–32)
Calcium: 9.5 mg/dL (ref 8.4–10.5)
Chloride: 103 mEq/L (ref 96–112)
Creatinine, Ser: 1.59 mg/dL — ABNORMAL HIGH (ref 0.40–1.50)
GFR: 56.17 mL/min — ABNORMAL LOW (ref >60.00)
Glucose, Bld: 166 mg/dL — ABNORMAL HIGH (ref 70–99)
Potassium: 4.5 mEq/L (ref 3.5–5.1)
Sodium: 141 mEq/L (ref 135–145)
Total Bilirubin: 0.6 mg/dL (ref 0.2–1.2)
Total Protein: 7.7 g/dL (ref 6.0–8.3)

## 2016-08-17 LAB — HEMOGLOBIN A1C: Hgb A1c MFr Bld: 8.4 % — ABNORMAL HIGH (ref 4.6–6.5)

## 2016-08-17 LAB — MICROALBUMIN / CREATININE URINE RATIO
Creatinine,U: 198.9 mg/dL
Microalb Creat Ratio: 3.8 mg/g (ref 0.0–30.0)
Microalb, Ur: 7.5 mg/dL — ABNORMAL HIGH (ref 0.0–1.9)

## 2016-08-17 LAB — PSA, MEDICARE: PSA: 0.14 ng/ml (ref 0.10–4.00)

## 2016-08-17 LAB — TSH: TSH: 2.14 u[IU]/mL (ref 0.35–4.50)

## 2016-08-17 MED ORDER — INSULIN GLARGINE 100 UNIT/ML SOLOSTAR PEN: 30.0000 [IU] | PEN_INJECTOR | Freq: Every day | SUBCUTANEOUS | Status: DC

## 2016-08-17 MED ORDER — AZITHROMYCIN 250 MG PO TABS: ORAL_TABLET | ORAL | 0 refills | Status: DC

## 2016-08-17 NOTE — Assessment & Plan Note (Signed)
Sugars well controlled at home A1c, urine microalbumin Agree with decreased insulin to 30 units daily

## 2016-08-17 NOTE — Assessment & Plan Note (Signed)
Continue statin. 

## 2016-08-17 NOTE — Assessment & Plan Note (Signed)
Continue plavix, good sugar and BP control

## 2016-08-17 NOTE — Telephone Encounter (Signed)
States insurance company needs to know code for cologuard to be able to let patient know if they will cover.  States insurance company ask for office to call their provider help line.

## 2016-08-17 NOTE — Assessment & Plan Note (Signed)
Persistent and worsening for two weeks Will prescribe an antibiotic - zpak Try otc nasal saline or flonase

## 2016-08-17 NOTE — Assessment & Plan Note (Signed)
BP well controlled Current regimen effective and well tolerated Continue current medications at current doses cmp  

## 2016-08-17 NOTE — Progress Notes (Signed)
Subjective:    Patient ID: [name], male    DOB: [birth year], 65 y.o.   MRN: [id number]  HPI The patient is here for follow up.  Diabetes: He is taking his medication daily as prescribed. He is compliant with a diabetic diet. He is exercising regularly. He monitors his sugars and they have very well controlled - so well controlled he has had a couple of low sugars and cardiology recommended that he decrease his insulin to 30 units daily,which I agree with.  He checks his feet daily and denies foot lesions. He is up-to-date with an ophthalmology examination.   Chronic combined heart failure, Hypertension: He is taking his medication daily. He is compliant with a low sodium diet.  He denies chest pain, palpitations, edema, shortness of breath and regular headaches. He is exercising regularly.  He does monitor his blood pressure at home and it is well controlled - he brought in his log in.    History of CVA:  He is taking all of his medications as prescribed.    Hyperlipidemia: He is taking his medication daily. He is compliant with a low fat/cholesterol diet. He is exercising regularly. He denies myalgias.   ED:  He has tried cialis and viagra in the past.  He has also tried the pump through the [place].  Nothing has worked well.  He would like to see urology.    For the past two weeks he has had pressure in his sinuses. He states nasal congestion, sore throat, ear pain, fever, cough and wheeze.   Medications and allergies reviewed with patient and updated if appropriate.  Patient Active Problem List   Diagnosis Date Noted  . Chronic combined systolic and diastolic heart failure ([place]) 06/29/2016  . LBBB (left bundle branch block) 02/02/2016  . Nonallopathic lesion of lumbosacral region 12/11/2014  . Nonallopathic lesion of sacral region 12/11/2014  . Nonallopathic lesion of thoracic region 12/11/2014  . Arthritis of left hip 11/20/2014  . Ischial bursitis of left side 10/28/2014    . Hamstring tightness of left lower extremity 09/16/2014  . Piriformis syndrome of left side 09/16/2014  . Dysphonia 06/22/2013  . Hemiparesis affecting left side as late effect of stroke ([place]) 10/10/2012  . Dysphagia following cerebrovascular accident 08/14/2012  . Chronic back pain   . Peripheral neuropathy ([place])   . TIA (transient ischemic attack) 04/08/2012  . Stroke ([place]) 02/21/2012  . [place], RIGHT 05/29/2010  . Type 2 diabetes, uncontrolled, with neuropathy ([place]) 04/13/2010  . ALLERGIC RHINITIS 04/13/2010  . GERD 04/13/2010  . ERECTILE DYSFUNCTION 03/12/2009  . HYPERTENSION, BENIGN 03/12/2009  . CAD, NATIVE VESSEL 03/12/2009  . Hyperlipidemia 03/07/2009    Current Outpatient Prescriptions on File Prior to Visit  Medication Sig Dispense Refill  . amLODipine (NORVASC) 2.5 MG tablet TAKE ONE TABLET BY MOUTH ONCE DAILY 30 tablet 3  . atenolol (TENORMIN) 50 MG tablet Take 1 tablet (50 mg total) by mouth daily. 90 tablet 3  . cetirizine (ZYRTEC) 10 MG tablet Take 10 mg by mouth as needed.    . chlorthalidone (HYGROTON) 25 MG tablet TAKE ONE TABLET BY MOUTH ONCE DAILY 90 tablet 3  . clopidogrel (PLAVIX) 75 MG tablet Take 1 tablet (75 mg total) by mouth daily. 90 tablet 2  . esomeprazole (NEXIUM) 40 MG capsule Take 1 capsule (40 mg total) by mouth daily. 90 capsule 3  . glucose blood (PRODIGY NO CODING BLOOD GLUC) test strip Use one strip to test blood  sugar four times a day Dx 250.62 100 each 11  . Insulin Glargine (LANTUS SOLOSTAR) 100 UNIT/ML Solostar Pen Inject 35 Units into the skin daily at 10 pm. (Patient taking differently: Inject 30 Units into the skin daily at 10 pm. ) 7 pen 5  . Insulin Pen Needle (ULTICARE SHORT PEN NEEDLES) 31G X 8 MM MISC USE AS DIRECTED 5 TIMES DAILY 150 each 0  . pravastatin (PRAVACHOL) 20 MG tablet Take 1 tablet (20 mg total) by mouth daily. 90 tablet 3  . PRODIGY TWIST TOP LANCETS 28G MISC USE ONE LANCET TO CHECK GLUCOSE 4 TIMES DAILY 120  each 5  . senna-docusate (SENOKOT-S) 8.6-50 MG per tablet Take 2 tablets by mouth 2 (two) times daily. For constipation.    . valsartan (DIOVAN) 320 MG tablet TAKE ONE TABLET BY MOUTH ONCE DAILY 90 tablet 2   No current facility-administered medications on file prior to visit.     Past Medical History:  Diagnosis Date  . ALLERGIC RHINITIS   . CAD, NATIVE VESSEL    BMS to OM1 2001, DES to BMS 2005  . Chronic back pain    herniated disc  . Chronic combined systolic and diastolic heart failure ([place]) 06/29/2016  . Constipation    takes Carafate four times day  . DIAB W/UNSPEC COMP TYPE II/UNSPEC TYPE UNCNTRL   . ERECTILE DYSFUNCTION   . GERD   . HYPERLIPIDEMIA-MIXED   . HYPERTENSION, BENIGN   . LBBB (left bundle branch block) 02/02/2016  . MORTON'S NEUROMA, RIGHT   . Peripheral neuropathy ([place])   . Seasonal allergies    takes Allegra and Benadryl daily prn;uses Flonase daily  . SHOULDER PAIN, RIGHT   . 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 TIA on medication 02/2012    Past Surgical History:  Procedure Laterality Date  . ANGIOPLASTY    . COLONOSCOPY    . CORONARY ANGIOPLASTY  2005   2 stents  . DENTAL SURGERY    . LARYNGOPLASTY  08/07/2012   Procedure: LARYNGOPLASTY;  Surgeon: Izora Gala, MD;  Location: [facility];  Service: ENT;  Laterality: Left;  Left Vocal Cord Medialyzation  . left knee surgury     x 2   . stent  2001, 2004   coronary stents    Social History   Social History  . Marital status: Legally Separated    Spouse name: N/A  . Number of children: 3  . Years of education: college   Occupational History  . disabled Disability   Social History Main Topics  . Smoking status: Former Smoker    Quit date: 02/24/1983  . Smokeless tobacco: Never Used  . Alcohol use No  . Drug use: No  . Sexual activity: No   Other Topics Concern  . None   Social History Narrative   Married, lives in [place] with wife. He is disabled secondary to back  pain. 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   Patient has 3 children.   Patient has college education.   Patient drinks 1 cup daily.    Family History  Problem Relation Age of Onset  . Cancer Mother     Review of Systems  Constitutional: Positive for fever (subjective).  HENT: Positive for congestion, ear pain (occasional right ear pain) and sore throat. Negative for sinus pressure.   Respiratory: Positive for cough (productive of white phlegm x 2 weeks) and  wheezing (occasional). Negative for shortness of breath.   Cardiovascular: Negative for chest pain, palpitations and leg swelling.  Gastrointestinal: Positive for abdominal pain (occ LLQ pain, sharp, transient) and constipation (sometimes). Negative for blood in stool and diarrhea.       Occ Gerd  Genitourinary: Positive for urgency. Negative for dysuria and hematuria.  Neurological: Positive for dizziness and light-headedness. Negative for headaches.       Objective:   Vitals:   08/17/16 0925  BP: 126/86  Pulse: 67  Resp: 16  Temp: 98.1 F (36.7 C)   Filed Weights   08/17/16 0925  Weight: 221 lb (100.2 kg)   Body mass index is 30.82 kg/m.   Physical Exam    Constitutional: Appears well-developed and well-nourished. No distress.  HENT:  Head: Normocephalic and atraumatic.  Neck: b/l ear canals and TM normal.  oropharynx without erythema. Neck supple. No tracheal deviation present. No thyromegaly present.  Cardiovascular: Normal rate, regular rhythm and normal heart sounds.   No murmur heard. No carotid bruit  no edema. Pulmonary/Chest: Effort normal and breath sounds normal. No respiratory distress. No has no wheezes. No rales.  Musculoskeletal: foot exam done  Lymphadenopathy: No cervical adenopathy.  Skin: Skin is warm and dry. Not diaphoretic.  Psychiatric: Normal mood and affect. Behavior is normal.     Assessment & Plan:    See Problem List for Assessment and Plan of  chronic medical problems.   F/u in 6 months

## 2016-08-17 NOTE — Patient Instructions (Addendum)
Call your insurance and see if Cologuard (test for colon cancer) is covered.   If it is let us know and we will arrange for you to do it.   Test(s) ordered today. Your results will be released to [facility] (or called to you) after review, usually within 72hours after test completion. If any changes need to be made, you will be notified at that same time.  All other Health Maintenance issues reviewed.   All recommended immunizations and age-appropriate screenings are up-to-date or discussed.  No immunizations administered today.   Medications reviewed and updated.  Changes include include an antibiotic for your sinus infection.  Your prescription(s) have been submitted to your pharmacy. Please take as directed and contact our office if you believe you are having problem(s) with the medication(s).  A referral was ordered for urology.   Please followup in 6 months

## 2016-08-17 NOTE — Telephone Encounter (Signed)
Spoke with pt, pt is to contact medicare to verify that they will cover the cologuard test.   Spoke with Cologuard- CPT code [phone], given to pt.

## 2016-08-17 NOTE — Assessment & Plan Note (Signed)
euvolemic on exam  Continue current medications 

## 2016-08-17 NOTE — Telephone Encounter (Signed)
Patient called back and said he called his insurance company. They told him they did cover it, it had to be put with the right code. And the doctor office would have to call them to be able to change that. Please follow up with patient once it has been done.  [phone]

## 2016-08-17 NOTE — Progress Notes (Signed)
Patient received education resource, including the self-management goal and tool. Patient verbalized understanding. 

## 2016-08-18 ENCOUNTER — Other Ambulatory Visit: Payer: Self-pay | Admitting: *Deleted

## 2016-08-18 MED ORDER — GLUCOSE BLOOD VI STRP: ORAL_STRIP | 11 refills | Status: DC

## 2016-08-18 MED ORDER — PRODIGY TWIST TOP LANCETS 28G MISC: 5 refills | Status: DC

## 2016-08-18 NOTE — Telephone Encounter (Signed)
Spoke with pt to inform that I have placed the order for Cologuard vie Colog[facility] Portal.

## 2016-08-18 NOTE — Telephone Encounter (Signed)
Rec'd call pt requesting refill on Prodigy lancets & strips. Sent to [facility]...[name]

## 2016-09-02 ENCOUNTER — Encounter: Payer: Self-pay | Admitting: Internal Medicine

## 2016-09-02 LAB — COLOGUARD: Cologuard: NEGATIVE

## 2016-09-14 ENCOUNTER — Encounter: Payer: Self-pay | Admitting: Internal Medicine

## 2016-09-19 ENCOUNTER — Telehealth: Payer: Self-pay | Admitting: Internal Medicine

## 2016-09-19 NOTE — Telephone Encounter (Signed)
Let the patient know their cologuard test is negative.  We should considering repeating a cologuard test in 3 years.

## 2016-09-20 ENCOUNTER — Ambulatory Visit (INDEPENDENT_AMBULATORY_CARE_PROVIDER_SITE_OTHER): Payer: Medicare Other | Admitting: Ophthalmology

## 2016-09-20 DIAGNOSIS — E113393 Type 2 diabetes mellitus with moderate nonproliferative diabetic retinopathy without macular edema, bilateral: Secondary | ICD-10-CM

## 2016-09-20 DIAGNOSIS — E11319 Type 2 diabetes mellitus with unspecified diabetic retinopathy without macular edema: Secondary | ICD-10-CM | POA: Diagnosis not present

## 2016-09-20 DIAGNOSIS — H2513 Age-related nuclear cataract, bilateral: Secondary | ICD-10-CM | POA: Diagnosis not present

## 2016-09-20 DIAGNOSIS — H43813 Vitreous degeneration, bilateral: Secondary | ICD-10-CM

## 2016-09-20 DIAGNOSIS — H353121 Nonexudative age-related macular degeneration, left eye, early dry stage: Secondary | ICD-10-CM

## 2016-09-20 DIAGNOSIS — H35033 Hypertensive retinopathy, bilateral: Secondary | ICD-10-CM | POA: Diagnosis not present

## 2016-09-20 DIAGNOSIS — I10 Essential (primary) hypertension: Secondary | ICD-10-CM

## 2016-09-20 LAB — HM DIABETES EYE EXAM

## 2016-09-20 NOTE — Telephone Encounter (Signed)
Spoke with pt to inform.  

## 2016-09-30 ENCOUNTER — Encounter: Payer: Self-pay | Admitting: Internal Medicine

## 2016-10-12 ENCOUNTER — Other Ambulatory Visit: Payer: Self-pay | Admitting: Internal Medicine

## 2016-11-13 ENCOUNTER — Other Ambulatory Visit: Payer: Self-pay | Admitting: Cardiovascular Disease

## 2016-11-16 NOTE — Telephone Encounter (Signed)
REFILL 

## 2016-11-16 NOTE — Telephone Encounter (Signed)
Review for refill. 

## 2016-11-25 ENCOUNTER — Other Ambulatory Visit: Payer: Self-pay | Admitting: Internal Medicine

## 2017-01-17 ENCOUNTER — Telehealth: Payer: Self-pay | Admitting: Internal Medicine

## 2017-01-17 MED ORDER — CARVEDILOL 6.25 MG PO TABS: 6.2500 mg | ORAL_TABLET | Freq: Two times a day (BID) | ORAL | 3 refills | Status: DC

## 2017-01-17 NOTE — Telephone Encounter (Signed)
sent 

## 2017-01-17 NOTE — Telephone Encounter (Signed)
Please advise 

## 2017-01-17 NOTE — Telephone Encounter (Signed)
Spoke with pt, please send Coreg to [facility]

## 2017-01-17 NOTE — Telephone Encounter (Signed)
We can try switching to a different medication in the same family - Coreg - it is taken twice a day.  I think cardiology would prefer to have him on the coreg anyway.  He would need to monitor his bp and schedule a follow up to make sure it is controlled.

## 2017-01-17 NOTE — Telephone Encounter (Signed)
Patient called and said that his atenolol (TENORMIN) 50 MG is making him sick. There is a different manufacturer for the medication. He wanted to come in office to talk with you about it. You do not have any appointment but your "same day" until next week,. He was unsure if he could go that long without taking his medication. Please advise if needs an appointment or follow up with patient. Thank you.

## 2017-02-10 ENCOUNTER — Telehealth: Payer: Self-pay | Admitting: *Deleted

## 2017-02-10 MED ORDER — INSULIN PEN NEEDLE 31G X 6 MM MISC: 3 refills | Status: DC

## 2017-02-10 NOTE — Telephone Encounter (Signed)
Rec'd call pt states he need rx for Novo fine pen needles sent to [facility]. Verified chart pt is up-to-date sent rx to [facility]...Johny Chess

## 2017-02-15 NOTE — Patient Instructions (Addendum)
  Test(s) ordered today. Your results will be released to [facility] (or called to you) after review, usually within 72hours after test completion. If any changes need to be made, you will be notified at that same time.   Medications reviewed and updated.  Changes include increasing amlodipine to 5 mg daily and changing coreg to metoprolol.   Your blood pressure should ideally be < 130/80 - if it is not follow up sooner.  Your prescription(s) have been submitted to your pharmacy. Please take as directed and contact our office if you believe you are having problem(s) with the medication(s).  A referral was ordered for podiatry  Please followup in 6 months, sooner if needed

## 2017-02-15 NOTE — Progress Notes (Signed)
Subjective:    Patient ID: [name], male    DOB: [birth year], 66 y.o.   MRN: [id number]  HPI The patient is here for follow up.  Diabetes: He is taking his medication daily as prescribed. He is compliant with a diabetic diet. He is not exercising regularly. He monitors his sugars and they have been running 98-117. He is up-to-date with an ophthalmology examination.   CAD, CHF, Hypertension: He is taking his medication daily. He is compliant with a low sodium diet.  He denies  Palpitations and regular headaches. He is not exercising regularly.  He does monitor his blood pressure at home - 150-160's/? At home.    Hyperlipidemia: He is taking his medication daily. He is compliant with a low fat/cholesterol diet. He is exercising regularly. He denies myalgias.   GERD:  He is taking his medication daily as prescribed.  He denies any GERD symptoms and feels his GERD is well controlled.   h/o CVA:  He is taking his plavix daily.    Foot, L > R swelling, trouble swallowing - this occurs this is from the coreg.  His color in his toes has changed as well.  He would like to see a podiatrist.   Cold symptoms:  He has been coughing for over one week.  He does have a lot of mucus in his chest.  There has been no improvement.  She has mild ear discomfort and sore throat at times.   Medications and allergies reviewed with patient and updated if appropriate.  Patient Active Problem List   Diagnosis Date Noted  . Chronic sinus infection 08/17/2016  . Chronic combined systolic and diastolic heart failure ([place]) 06/29/2016  . LBBB (left bundle branch block) 02/02/2016  . Nonallopathic lesion of lumbosacral region 12/11/2014  . Nonallopathic lesion of sacral region 12/11/2014  . Nonallopathic lesion of thoracic region 12/11/2014  . Arthritis of left hip 11/20/2014  . Ischial bursitis of left side 10/28/2014  . Hamstring tightness of left lower extremity 09/16/2014  . Piriformis syndrome of  left side 09/16/2014  . Dysphonia 06/22/2013  . Hemiparesis affecting left side as late effect of stroke ([place]) 10/10/2012  . Dysphagia following cerebrovascular accident 08/14/2012  . Chronic back pain   . Peripheral neuropathy ([place])   . TIA (transient ischemic attack) 04/08/2012  . Stroke ([place]) 02/21/2012  . [place], RIGHT 05/29/2010  . Type 2 diabetes, uncontrolled, with neuropathy ([place]) 04/13/2010  . ALLERGIC RHINITIS 04/13/2010  . GERD 04/13/2010  . ERECTILE DYSFUNCTION 03/12/2009  . HYPERTENSION, BENIGN 03/12/2009  . CAD, NATIVE VESSEL 03/12/2009  . Hyperlipidemia 03/07/2009    Current Outpatient Prescriptions on File Prior to Visit  Medication Sig Dispense Refill  . amLODipine (NORVASC) 2.5 MG tablet TAKE ONE TABLET BY MOUTH ONCE DAILY 90 tablet 3  . carvedilol (COREG) 6.25 MG tablet Take 1 tablet (6.25 mg total) by mouth 2 (two) times daily with a meal. 60 tablet 3  . cetirizine (ZYRTEC) 10 MG tablet Take 10 mg by mouth as needed.    . chlorthalidone (HYGROTON) 25 MG tablet TAKE ONE TABLET BY MOUTH ONCE DAILY 90 tablet 3  . clopidogrel (PLAVIX) 75 MG tablet TAKE ONE TABLET BY MOUTH ONCE DAILY 90 tablet 0  . esomeprazole (NEXIUM) 40 MG capsule Take 1 capsule (40 mg total) by mouth daily. 90 capsule 3  . glucose blood (PRODIGY NO CODING BLOOD GLUC) test strip Use one strip to test blood sugar four times a day Dx  E11.9 120 each 11  . Insulin Glargine (LANTUS SOLOSTAR) 100 UNIT/ML Solostar Pen Inject 30 Units into the skin daily at 10 pm.    . Insulin Pen Needle 31G X 6 MM MISC Use to administer lantus insulin at bedtime 90 each 3  . pravastatin (PRAVACHOL) 20 MG tablet TAKE ONE TABLET BY MOUTH ONCE DAILY 90 tablet 2  . PRODIGY TWIST TOP LANCETS 28G MISC USE ONE LANCET TO CHECK GLUCOSE 4 TIMES DAILY Dx E11.9 200 each 5  . senna-docusate (SENOKOT-S) 8.6-50 MG per tablet Take 2 tablets by mouth 2 (two) times daily. For constipation.    . valsartan (DIOVAN) 320 MG tablet  TAKE ONE TABLET BY MOUTH ONCE DAILY 90 tablet 2   No current facility-administered medications on file prior to visit.     Past Medical History:  Diagnosis Date  . ALLERGIC RHINITIS   . CAD, NATIVE VESSEL    BMS to OM1 2001, DES to BMS 2005  . Chronic back pain    herniated disc  . Chronic combined systolic and diastolic heart failure ([place]) 06/29/2016  . Constipation    takes Carafate four times day  . DIAB W/UNSPEC COMP TYPE II/UNSPEC TYPE UNCNTRL   . ERECTILE DYSFUNCTION   . GERD   . HYPERLIPIDEMIA-MIXED   . HYPERTENSION, BENIGN   . LBBB (left bundle branch block) 02/02/2016  . MORTON'S NEUROMA, RIGHT   . Peripheral neuropathy ([place])   . Seasonal allergies    takes Allegra and Benadryl daily prn;uses Flonase daily  . SHOULDER PAIN, RIGHT   . 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 TIA on medication 02/2012    Past Surgical History:  Procedure Laterality Date  . ANGIOPLASTY    . COLONOSCOPY    . CORONARY ANGIOPLASTY  2005   2 stents  . DENTAL SURGERY    . LARYNGOPLASTY  08/07/2012   Procedure: LARYNGOPLASTY;  Surgeon: Izora Gala, MD;  Location: [facility];  Service: ENT;  Laterality: Left;  Left Vocal Cord Medialyzation  . left knee surgury     x 2   . stent  2001, 2004   coronary stents    Social History   Social History  . Marital status: Legally Separated    Spouse name: N/A  . Number of children: 3  . Years of education: college   Occupational History  . disabled Disability   Social History Main Topics  . Smoking status: Former Smoker    Quit date: 02/24/1983  . Smokeless tobacco: Never Used  . Alcohol use No  . Drug use: No  . Sexual activity: No   Other Topics Concern  . Not on file   Social History Narrative   Married, lives in [place] with wife. He is disabled secondary to back pain. 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   Patient has 3 children.   Patient has college education.   Patient drinks  1 cup daily.    Family History  Problem Relation Age of Onset  . Cancer Mother     Review of Systems  Constitutional: Positive for diaphoresis (at night sometimes). Negative for appetite change and fever.  HENT: Positive for ear pain (discomfort) and sore throat (at times).   Respiratory: Positive for cough (with chest congestion) and shortness of breath (rest and wtih exertion). Negative for wheezing.   Cardiovascular: Positive for chest pain (one episode, related to cold) and leg  swelling. Negative for palpitations.  Gastrointestinal: Positive for nausea (occ). Negative for abdominal pain.  Neurological: Positive for dizziness (occ) and light-headedness. Negative for headaches.  Psychiatric/Behavioral: Negative for dysphoric mood. The patient is not nervous/anxious.        Objective:   Vitals:   02/16/17 0938  BP: (!) 152/84  Pulse: 83  Resp: 16  Temp: 98.2 F (36.8 C)   Wt Readings from Last 3 Encounters:  02/16/17 221 lb (100.2 kg)  08/17/16 221 lb (100.2 kg)  07/20/16 221 lb 8 oz (100.5 kg)   Body mass index is 30.82 kg/m.   Physical Exam    Constitutional: Appears well-developed and well-nourished. No distress.  HENT:  Head: Normocephalic and atraumatic.  Neck: Neck supple. No tracheal deviation present. No thyromegaly present.  No cervical lymphadenopathy.  b/l ear canals normal,  b/l TM normal.  Oropharynx normal Cardiovascular: Normal rate, regular rhythm and normal heart sounds.   No murmur heard. No carotid bruit .  No edema Pulmonary/Chest: Effort normal and breath sounds normal. No respiratory distress. No has no wheezes. No rales.  Skin: Skin is warm and dry. Not diaphoretic.  Psychiatric: Normal mood and affect. Behavior is normal.      Assessment & Plan:    See Problem List for Assessment and Plan of chronic medical problems.

## 2017-02-16 ENCOUNTER — Other Ambulatory Visit (INDEPENDENT_AMBULATORY_CARE_PROVIDER_SITE_OTHER): Payer: Medicare Other

## 2017-02-16 ENCOUNTER — Encounter: Payer: Self-pay | Admitting: Internal Medicine

## 2017-02-16 ENCOUNTER — Ambulatory Visit (INDEPENDENT_AMBULATORY_CARE_PROVIDER_SITE_OTHER): Payer: Medicare Other | Admitting: Internal Medicine

## 2017-02-16 VITALS — BP 152/84 | HR 83 | Temp 98.2°F | Resp 16 | Ht 71.0 in | Wt 221.0 lb

## 2017-02-16 DIAGNOSIS — E1140 Type 2 diabetes mellitus with diabetic neuropathy, unspecified: Secondary | ICD-10-CM

## 2017-02-16 DIAGNOSIS — I10 Essential (primary) hypertension: Secondary | ICD-10-CM

## 2017-02-16 DIAGNOSIS — J22 Unspecified acute lower respiratory infection: Secondary | ICD-10-CM | POA: Diagnosis not present

## 2017-02-16 DIAGNOSIS — IMO0002 Reserved for concepts with insufficient information to code with codable children: Secondary | ICD-10-CM

## 2017-02-16 DIAGNOSIS — E1165 Type 2 diabetes mellitus with hyperglycemia: Secondary | ICD-10-CM

## 2017-02-16 DIAGNOSIS — E7800 Pure hypercholesterolemia, unspecified: Secondary | ICD-10-CM

## 2017-02-16 DIAGNOSIS — K219 Gastro-esophageal reflux disease without esophagitis: Secondary | ICD-10-CM | POA: Diagnosis not present

## 2017-02-16 DIAGNOSIS — D649 Anemia, unspecified: Secondary | ICD-10-CM

## 2017-02-16 DIAGNOSIS — D638 Anemia in other chronic diseases classified elsewhere: Secondary | ICD-10-CM | POA: Insufficient documentation

## 2017-02-16 LAB — FERRITIN: Ferritin: 209 ng/mL (ref 22.0–322.0)

## 2017-02-16 LAB — HEMOGLOBIN A1C: Hgb A1c MFr Bld: 9.7 % — ABNORMAL HIGH (ref 4.6–6.5)

## 2017-02-16 LAB — CBC WITH DIFFERENTIAL/PLATELET
Basophils Absolute: 0 10*3/uL (ref 0.0–0.1)
Basophils Relative: 0.6 % (ref 0.0–3.0)
Eosinophils Absolute: 0.2 10*3/uL (ref 0.0–0.7)
Eosinophils Relative: 3.4 % (ref 0.0–5.0)
HCT: 34.9 % — ABNORMAL LOW (ref 39.0–52.0)
Hemoglobin: 11.8 g/dL — ABNORMAL LOW (ref 13.0–17.0)
Lymphocytes Relative: 32.5 % (ref 12.0–46.0)
Lymphs Abs: 2.2 10*3/uL (ref 0.7–4.0)
MCHC: 33.9 g/dL (ref 30.0–36.0)
MCV: 91.2 fl (ref 78.0–100.0)
Monocytes Absolute: 0.8 10*3/uL (ref 0.1–1.0)
Monocytes Relative: 12 % (ref 3.0–12.0)
Neutro Abs: 3.5 10*3/uL (ref 1.4–7.7)
Neutrophils Relative %: 51.5 % (ref 43.0–77.0)
Platelets: 224 10*3/uL (ref 150.0–400.0)
RBC: 3.83 Mil/uL — ABNORMAL LOW (ref 4.22–5.81)
RDW: 12.6 % (ref 11.5–15.5)
WBC: 6.8 10*3/uL (ref 4.0–10.5)

## 2017-02-16 LAB — COMPREHENSIVE METABOLIC PANEL
ALT: 18 U/L (ref 0–53)
AST: 36 U/L (ref 0–37)
Albumin: 4.3 g/dL (ref 3.5–5.2)
Alkaline Phosphatase: 62 U/L (ref 39–117)
BUN: 33 mg/dL — ABNORMAL HIGH (ref 6–23)
CO2: 32 mEq/L (ref 19–32)
Calcium: 9.4 mg/dL (ref 8.4–10.5)
Chloride: 100 mEq/L (ref 96–112)
Creatinine, Ser: 1.78 mg/dL — ABNORMAL HIGH (ref 0.40–1.50)
GFR: 49.23 mL/min — ABNORMAL LOW (ref >60.00)
Glucose, Bld: 150 mg/dL — ABNORMAL HIGH (ref 70–99)
Potassium: 3.2 mEq/L — ABNORMAL LOW (ref 3.5–5.1)
Sodium: 139 mEq/L (ref 135–145)
Total Bilirubin: 0.9 mg/dL (ref 0.2–1.2)
Total Protein: 8 g/dL (ref 6.0–8.3)

## 2017-02-16 LAB — IRON: Iron: 89 ug/dL (ref 42–165)

## 2017-02-16 MED ORDER — AMLODIPINE BESYLATE 5 MG PO TABS: 5.0000 mg | ORAL_TABLET | Freq: Every day | ORAL | 3 refills | Status: DC

## 2017-02-16 MED ORDER — VALSARTAN 320 MG PO TABS: 320.0000 mg | ORAL_TABLET | Freq: Every day | ORAL | 2 refills | Status: DC

## 2017-02-16 MED ORDER — CHLORTHALIDONE 25 MG PO TABS: 25.0000 mg | ORAL_TABLET | Freq: Every day | ORAL | 1 refills | Status: DC

## 2017-02-16 MED ORDER — METOPROLOL SUCCINATE ER 50 MG PO TB24: 50.0000 mg | ORAL_TABLET | Freq: Every day | ORAL | 3 refills | Status: DC

## 2017-02-16 MED ORDER — DOXYCYCLINE HYCLATE 100 MG PO TABS
100.0000 mg | ORAL_TABLET | Freq: Two times a day (BID) | ORAL | 0 refills | Status: DC
Start: 2017-02-16 — End: 2017-03-16

## 2017-02-16 NOTE — Assessment & Plan Note (Signed)
Probable bacterial infection Start doxycycline Otc cold medications if needed for symptoms

## 2017-02-16 NOTE — Assessment & Plan Note (Signed)
GERD controlled Continue daily medication  

## 2017-02-16 NOTE — Assessment & Plan Note (Addendum)
Check cbc, iron, ferritin 

## 2017-02-16 NOTE — Assessment & Plan Note (Signed)
Sugars well controlled at home Check a1c Increase exercise

## 2017-02-16 NOTE — Progress Notes (Signed)
Pre visit review using our clinic review tool, if applicable. No additional management support is needed unless otherwise documented below in the visit note. 

## 2017-02-16 NOTE — Assessment & Plan Note (Signed)
bp high here today, also concern for SE with coreg - which do not sound like true side effects Will stop coreg - start metoprolol 50 mg daily Increase amlodipine to 5 mg daily Continue valsartan 320 mg daily Monitor BP at home, discussed BP goal Increase exercise

## 2017-02-22 ENCOUNTER — Other Ambulatory Visit: Payer: Self-pay | Admitting: Internal Medicine

## 2017-02-22 DIAGNOSIS — N1832 Chronic kidney disease, stage 3b: Secondary | ICD-10-CM | POA: Insufficient documentation

## 2017-02-22 DIAGNOSIS — E1165 Type 2 diabetes mellitus with hyperglycemia: Principal | ICD-10-CM

## 2017-02-22 DIAGNOSIS — N183 Chronic kidney disease, stage 3 unspecified: Secondary | ICD-10-CM | POA: Insufficient documentation

## 2017-02-22 DIAGNOSIS — E1140 Type 2 diabetes mellitus with diabetic neuropathy, unspecified: Secondary | ICD-10-CM

## 2017-02-22 DIAGNOSIS — N189 Chronic kidney disease, unspecified: Secondary | ICD-10-CM

## 2017-02-22 DIAGNOSIS — IMO0002 Reserved for concepts with insufficient information to code with codable children: Secondary | ICD-10-CM

## 2017-02-23 ENCOUNTER — Other Ambulatory Visit (INDEPENDENT_AMBULATORY_CARE_PROVIDER_SITE_OTHER): Payer: Medicare Other

## 2017-02-23 DIAGNOSIS — N189 Chronic kidney disease, unspecified: Secondary | ICD-10-CM | POA: Diagnosis not present

## 2017-02-23 LAB — BASIC METABOLIC PANEL
BUN: 34 mg/dL — ABNORMAL HIGH (ref 6–23)
CO2: 29 mEq/L (ref 19–32)
Calcium: 9.8 mg/dL (ref 8.4–10.5)
Chloride: 102 mEq/L (ref 96–112)
Creatinine, Ser: 1.81 mg/dL — ABNORMAL HIGH (ref 0.40–1.50)
GFR: 48.29 mL/min — ABNORMAL LOW (ref >60.00)
Glucose, Bld: 129 mg/dL — ABNORMAL HIGH (ref 70–99)
Potassium: 3.5 mEq/L (ref 3.5–5.1)
Sodium: 137 mEq/L (ref 135–145)

## 2017-02-24 ENCOUNTER — Other Ambulatory Visit: Payer: Self-pay | Admitting: Internal Medicine

## 2017-02-24 DIAGNOSIS — N189 Chronic kidney disease, unspecified: Secondary | ICD-10-CM

## 2017-02-24 MED ORDER — CHLORTHALIDONE 25 MG PO TABS: 12.5000 mg | ORAL_TABLET | Freq: Every day | ORAL | 1 refills | Status: DC

## 2017-02-24 MED ORDER — AMLODIPINE BESYLATE 5 MG PO TABS: 10.0000 mg | ORAL_TABLET | Freq: Every day | ORAL | 3 refills | Status: DC

## 2017-03-01 ENCOUNTER — Other Ambulatory Visit (INDEPENDENT_AMBULATORY_CARE_PROVIDER_SITE_OTHER): Payer: Medicare Other

## 2017-03-01 DIAGNOSIS — N189 Chronic kidney disease, unspecified: Secondary | ICD-10-CM | POA: Diagnosis not present

## 2017-03-01 LAB — BASIC METABOLIC PANEL
BUN: 26 mg/dL — ABNORMAL HIGH (ref 6–23)
CO2: 31 mEq/L (ref 19–32)
Calcium: 9.6 mg/dL (ref 8.4–10.5)
Chloride: 103 mEq/L (ref 96–112)
Creatinine, Ser: 1.62 mg/dL — ABNORMAL HIGH (ref 0.40–1.50)
GFR: 54.88 mL/min — ABNORMAL LOW (ref >60.00)
Glucose, Bld: 256 mg/dL — ABNORMAL HIGH (ref 70–99)
Potassium: 3.9 mEq/L (ref 3.5–5.1)
Sodium: 139 mEq/L (ref 135–145)

## 2017-03-03 ENCOUNTER — Other Ambulatory Visit: Payer: Self-pay | Admitting: Internal Medicine

## 2017-03-15 ENCOUNTER — Ambulatory Visit: Payer: Medicare Other | Admitting: Endocrinology

## 2017-03-16 ENCOUNTER — Ambulatory Visit (INDEPENDENT_AMBULATORY_CARE_PROVIDER_SITE_OTHER): Payer: Medicare Other | Admitting: Endocrinology

## 2017-03-16 ENCOUNTER — Encounter: Payer: Self-pay | Admitting: Endocrinology

## 2017-03-16 VITALS — BP 138/88 | HR 88 | Ht 71.0 in | Wt 218.0 lb

## 2017-03-16 DIAGNOSIS — IMO0002 Reserved for concepts with insufficient information to code with codable children: Secondary | ICD-10-CM

## 2017-03-16 DIAGNOSIS — E1165 Type 2 diabetes mellitus with hyperglycemia: Principal | ICD-10-CM

## 2017-03-16 DIAGNOSIS — I2510 Atherosclerotic heart disease of native coronary artery without angina pectoris: Secondary | ICD-10-CM | POA: Diagnosis not present

## 2017-03-16 DIAGNOSIS — E1169 Type 2 diabetes mellitus with other specified complication: Secondary | ICD-10-CM

## 2017-03-16 DIAGNOSIS — Z794 Long term (current) use of insulin: Secondary | ICD-10-CM

## 2017-03-16 DIAGNOSIS — E1140 Type 2 diabetes mellitus with diabetic neuropathy, unspecified: Secondary | ICD-10-CM

## 2017-03-16 MED ORDER — INSULIN LISPRO 100 UNIT/ML (KWIKPEN): 8.0000 [IU] | PEN_INJECTOR | Freq: Every day | SUBCUTANEOUS | 11 refills | Status: DC

## 2017-03-16 MED ORDER — INSULIN GLARGINE 100 UNIT/ML SOLOSTAR PEN: 25.0000 [IU] | PEN_INJECTOR | SUBCUTANEOUS | Status: DC

## 2017-03-16 NOTE — Patient Instructions (Signed)
good diet and exercise significantly improve the control of your diabetes.  please let me know if you wish to be referred to a dietician.  high blood sugar is very risky to your health.  you should see an eye doctor and dentist every year.  It is very important to get all recommended vaccinations.  Controlling your blood pressure and cholesterol drastically reduces the damage diabetes does to your body.  Those who smoke should quit.  Please discuss these with your doctor.  check your blood sugar twice a day.  vary the time of day when you check, between before the 3 meals, and at bedtime.  also check if you have symptoms of your blood sugar being too high or too low.  please keep a record of the readings and bring it to your next appointment here (or you can bring the meter itself).  You can write it on any piece of paper.  please call us sooner if your blood sugar goes below 70, or if you have a lot of readings over 200. For now, please: reduce the lantus to 25 units daily, and take it in the morning, and: If you eat breakfast, take humalog 8 units with it.   Please come back for a follow-up appointment in 1 month.

## 2017-03-16 NOTE — Progress Notes (Signed)
Subjective:    Patient ID: [name], male    DOB: 1951-06-14, 66 y.o.   MRN: [id number]  HPI pt is referred by Dr Quay Burow, for diabetes.  Pt states DM was dx'ed in 2007; he has severe polyneuropathy of the lower extremities, and assoc pain; he also has CAD, renal insufficiency, retinopathy, and TIA; he has been on insulin since 2011; pt says his diet is fair, but exercise is limited by health problems; he has never had pancreatitis, pancreatic surgery, severe hypoglycemia or DKA.  He has mild hypoglycemia approx once per week. This usually happens at noon.  This is also the time when cbg's are highest.  he says the variability is due to whether or not he eats breakfast.  He says he never misses the insulin.   Past Medical History:  Diagnosis Date  . ALLERGIC RHINITIS   . CAD, NATIVE VESSEL    BMS to OM1 2001, DES to BMS 2005  . Chronic back pain    herniated disc  . Chronic combined systolic and diastolic heart failure ([place]) 06/29/2016  . Constipation    takes Carafate four times day  . DIAB W/UNSPEC COMP TYPE II/UNSPEC TYPE UNCNTRL   . ERECTILE DYSFUNCTION   . GERD   . HYPERLIPIDEMIA-MIXED   . HYPERTENSION, BENIGN   . LBBB (left bundle branch block) 02/02/2016  . MORTON'S NEUROMA, RIGHT   . Peripheral neuropathy   . Seasonal allergies    takes Allegra and Benadryl daily prn;uses Flonase daily  . SHOULDER PAIN, RIGHT   . 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 TIA on medication 02/2012    Past Surgical History:  Procedure Laterality Date  . ANGIOPLASTY    . COLONOSCOPY    . CORONARY ANGIOPLASTY  2005   2 stents  . DENTAL SURGERY    . LARYNGOPLASTY  08/07/2012   Procedure: LARYNGOPLASTY;  Surgeon: Izora Gala, MD;  Location: [facility];  Service: ENT;  Laterality: Left;  Left Vocal Cord Medialyzation  . left knee surgury     x 2   . stent  2001, 2004   coronary stents    Social History   Social History  . Marital status: Legally Separated    Spouse name: N/A  . Number of children: 3  . Years of education: college   Occupational History  . disabled Disability   Social History Main Topics  . Smoking status: Former Smoker    Quit date: 02/24/1983  . Smokeless tobacco: Never Used  . Alcohol use No  . Drug use: No  . Sexual activity: No   Other Topics Concern  . Not on file   Social History Narrative   Married, lives in [place] with wife. He is disabled secondary to back pain. 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   Patient has 3 children.   Patient has college education.   Patient drinks 1 cup daily.    Current Outpatient Prescriptions on File Prior to Visit  Medication Sig Dispense Refill  . amLODipine (NORVASC) 5 MG tablet Take 2 tablets (10 mg total) by mouth daily. 90 tablet 3  . cetirizine (ZYRTEC) 10 MG tablet Take 10 mg by mouth as needed.    . chlorthalidone (HYGROTON) 25 MG tablet Take 0.5 tablets (12.5 mg total) by mouth daily. 90 tablet 1  . clopidogrel (PLAVIX) 75 MG tablet TAKE ONE TABLET BY MOUTH ONCE DAILY 90  tablet 1  . esomeprazole (NEXIUM) 40 MG capsule Take 1 capsule (40 mg total) by mouth daily. 90 capsule 3  . glucose blood (PRODIGY NO CODING BLOOD GLUC) test strip Use one strip to test blood sugar four times a day Dx E11.9 120 each 11  . Insulin Pen Needle 31G X 6 MM MISC Use to administer lantus insulin at bedtime 90 each 3  . metoprolol succinate (TOPROL-XL) 50 MG 24 hr tablet Take 1 tablet (50 mg total) by mouth daily. Take with or immediately following a meal. 90 tablet 3  . pravastatin (PRAVACHOL) 20 MG tablet TAKE ONE TABLET BY MOUTH ONCE DAILY 90 tablet 2  . PRODIGY TWIST TOP LANCETS 28G MISC USE ONE LANCET TO CHECK GLUCOSE 4 TIMES DAILY Dx E11.9 200 each 5  . senna-docusate (SENOKOT-S) 8.6-50 MG per tablet Take 2 tablets by mouth 2 (two) times daily. For constipation.    . valsartan (DIOVAN) 320 MG tablet Take 1 tablet (320 mg total) by mouth daily. 90 tablet 2    No current facility-administered medications on file prior to visit.     Allergies  Allergen Reactions  . Penicillins Anaphylaxis  . Pneumococcal Vaccines Anaphylaxis  . Shellfish Allergy Anaphylaxis  . Influenza Vaccines   . Lisinopril     cough  . Topiramate Other (See Comments)    Chest spasms and numbness    Family History  Problem Relation Age of Onset  . Cancer Mother     BP 138/88   Pulse 88   Ht 5\' 11"  (1.803 m)   Wt 218 lb (98.9 kg)   SpO2 97%   BMI 30.40 kg/m   Review of Systems denies blurry vision, sob, n/v, depression, and easy bruising.  He has lost weight. He has intermittent headache.  He seldom has chest pain. He has frequent urination, dry skin, cold intolerance, rhinorrhea, and leg cramps.      Objective:   Physical Exam VS: see vs page GEN: no distress HEAD: head: no deformity eyes: no periorbital swelling, no proptosis external nose and ears are normal mouth: no lesion seen NECK: supple, thyroid is not enlarged CHEST WALL: no deformity LUNGS: clear to auscultation CV: reg rate and rhythm, no murmur.  ABD: abdomen is soft, nontender.  no hepatosplenomegaly.  not distended.  no hernia.  MUSCULOSKELETAL: muscle bulk and strength are grossly normal.  no obvious joint swelling.  gait steady with a cane EXTEMITIES: no deformity.  no ulcer on the feet.  feet are of normal color and temp.  no edema.   PULSES: dorsalis pedis intact bilat.  no carotid bruit.   NEURO:  cn 2-12 grossly intact.   readily moves all 4's.  sensation is intact to touch on the feet, but decreased from normal.  SKIN:  Normal texture and temperature.  No rash or suspicious lesion is visible.   NODES:  None palpable at the neck.  PSYCH: alert, well-oriented.  Does not appear anxious nor depressed.    Lab Results  Component Value Date   HGBA1C 9.7 (H) 02/16/2017   I personally reviewed electrocardiogram tracing (02/02/16): Indication: CAD Impression: NSR.  No MI.   LBBB Compared to 2013: LBBB is new  I have reviewed outside records, and summarized: Pt was noted to have severely elevated a1c, and referred here.  Pt was said to be compliant with diet and meds.      Assessment & Plan:  Insulin-requiring type 2 DM, with CAD: severe exacerbation.  She needs  to vary the breakfast insulin.    Patient Instructions  good diet and exercise significantly improve the control of your diabetes.  please let me know if you wish to be referred to a dietician.  high blood sugar is very risky to your health.  you should see an eye doctor and dentist every year.  It is very important to get all recommended vaccinations.  Controlling your blood pressure and cholesterol drastically reduces the damage diabetes does to your body.  Those who smoke should quit.  Please discuss these with your doctor.  check your blood sugar twice a day.  vary the time of day when you check, between before the 3 meals, and at bedtime.  also check if you have symptoms of your blood sugar being too high or too low.  please keep a record of the readings and bring it to your next appointment here (or you can bring the meter itself).  You can write it on any piece of paper.  please call us sooner if your blood sugar goes below 70, or if you have a lot of readings over 200. For now, please: reduce the lantus to 25 units daily, and take it in the morning, and: If you eat breakfast, take humalog 8 units with it.   Please come back for a follow-up appointment in 1 month.

## 2017-03-23 ENCOUNTER — Ambulatory Visit (INDEPENDENT_AMBULATORY_CARE_PROVIDER_SITE_OTHER): Payer: Medicare Other | Admitting: Podiatry

## 2017-03-23 ENCOUNTER — Encounter: Payer: Self-pay | Admitting: Podiatry

## 2017-03-23 VITALS — BP 143/85 | HR 95 | Resp 16

## 2017-03-23 DIAGNOSIS — R269 Unspecified abnormalities of gait and mobility: Secondary | ICD-10-CM | POA: Diagnosis not present

## 2017-03-23 DIAGNOSIS — E1142 Type 2 diabetes mellitus with diabetic polyneuropathy: Secondary | ICD-10-CM | POA: Diagnosis not present

## 2017-03-23 DIAGNOSIS — I69398 Other sequelae of cerebral infarction: Secondary | ICD-10-CM | POA: Diagnosis not present

## 2017-03-23 NOTE — Patient Instructions (Signed)
Your walking is unsteady primarily because of the stroke affecting her left side. I recommend you contact your primary care physician for physical therapy referral for gait disturbance and balance therapy  Diabetes and Foot Care Diabetes may cause you to have problems because of poor blood supply (circulation) to your feet and legs. This may cause the skin on your feet to become thinner, break easier, and heal more slowly. Your skin may become dry, and the skin may peel and crack. You may also have nerve damage in your legs and feet causing decreased feeling in them. You may not notice minor injuries to your feet that could lead to infections or more serious problems. Taking care of your feet is one of the most important things you can do for yourself. Follow these instructions at home:  Wear shoes at all times, even in the house. Do not go barefoot. Bare feet are easily injured.  Check your feet daily for blisters, cuts, and redness. If you cannot see the bottom of your feet, use a mirror or ask someone for help.  Wash your feet with warm water (do not use hot water) and mild soap. Then pat your feet and the areas between your toes until they are completely dry. Do not soak your feet as this can dry your skin.  Apply a moisturizing lotion or petroleum jelly (that does not contain alcohol and is unscented) to the skin on your feet and to dry, brittle toenails. Do not apply lotion between your toes.  Trim your toenails straight across. Do not dig under them or around the cuticle. File the edges of your nails with an emery board or nail file.  Do not cut corns or calluses or try to remove them with medicine.  Wear clean socks or stockings every day. Make sure they are not too tight. Do not wear knee-high stockings since they may decrease blood flow to your legs.  Wear shoes that fit properly and have enough cushioning. To break in new shoes, wear them for just a few hours a day. This prevents  you from injuring your feet. Always look in your shoes before you put them on to be sure there are no objects inside.  Do not cross your legs. This may decrease the blood flow to your feet.  If you find a minor scrape, cut, or break in the skin on your feet, keep it and the skin around it clean and dry. These areas may be cleansed with mild soap and water. Do not cleanse the area with peroxide, alcohol, or iodine.  When you remove an adhesive bandage, be sure not to damage the skin around it.  If you have a wound, look at it several times a day to make sure it is healing.  Do not use heating pads or hot water bottles. They may burn your skin. If you have lost feeling in your feet or legs, you may not know it is happening until it is too late.  Make sure your health care provider performs a complete foot exam at least annually or more often if you have foot problems. Report any cuts, sores, or bruises to your health care provider immediately. Contact a health care provider if:  You have an injury that is not healing.  You have cuts or breaks in the skin.  You have an ingrown nail.  You notice redness on your legs or feet.  You feel burning or tingling in your legs or feet.  You have pain or cramps in your legs and feet.  Your legs or feet are numb.  Your feet always feel cold. Get help right away if:  There is increasing redness, swelling, or pain in or around a wound.  There is a red line that goes up your leg.  Pus is coming from a wound.  You develop a fever or as directed by your health care provider.  You notice a bad smell coming from an ulcer or wound. This information is not intended to replace advice given to you by your health care provider. Make sure you discuss any questions you have with your health care provider. Document Released: 11/05/2000 Document Revised: 04/15/2016 Document Reviewed: 04/17/2013 Elsevier Interactive Patient Education  2017 [employer].

## 2017-03-23 NOTE — Progress Notes (Signed)
   Subjective:    Patient ID: [name], male    DOB: 1951/08/18, 66 y.o.   MRN: [id number]  HPI   I  This patient presents today requesting a general diabetic foot examination. He has a diabetic for approximately 20 years and denies any history of foot ulceration, claudication or amputation. He denies any recent foot evaluation Patient is a former smoker discontinue 1984 Patient also states that he had a stroke affecting his left upper and lower extremity resulting in unsteady gait pattern with a tendency to fall because of this unsteady gait pattern   Review of Systems  HENT: Positive for sinus pressure, sore throat and trouble swallowing.   Respiratory: Positive for cough.   Cardiovascular: Positive for leg swelling.  Musculoskeletal: Positive for myalgias.  Neurological: Positive for dizziness and weakness.  All other systems reviewed and are negative.      Objective:   Physical Exam Orientated 3  Vascular: DP and PT pulses 2/4 bilaterally Capillary reflex delay bilaterally  Neurological: Sensation to 10 g monofilament wire intact 5/5 right 4/5 left Vibratory sensation reactive right nonreactive left Ankle reflexes reactive bilaterally  Dermatological: No skin lesions bilaterally Atrophic skin with absent hair growth bilaterally  Musculoskeletal: Hammertoe second bilaterally Patient has spastic uncoordinated gait Manual motor testing dorsi flexion, plantar flexion, inversion, eversion 5/5 bilaterally       Assessment & Plan:   Assessment: Diabetic with neuropathy 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 I suggested texture patient contact his primary care physician for physical therapy referral for gait disturbance and balance therapy I recommended patient wear sturdy lace up athletics walking or running style shoes  Reappoint at patient's request

## 2017-04-07 ENCOUNTER — Encounter: Payer: Self-pay | Admitting: Nurse Practitioner

## 2017-04-07 ENCOUNTER — Ambulatory Visit (INDEPENDENT_AMBULATORY_CARE_PROVIDER_SITE_OTHER): Payer: Medicare Other | Admitting: Nurse Practitioner

## 2017-04-07 ENCOUNTER — Ambulatory Visit (INDEPENDENT_AMBULATORY_CARE_PROVIDER_SITE_OTHER)
Admission: RE | Admit: 2017-04-07 | Discharge: 2017-04-07 | Disposition: A | Discharge status: Home / Self Care | Payer: Medicare Other | Source: Ambulatory Visit | Attending: Nurse Practitioner | Admitting: Nurse Practitioner

## 2017-04-07 VITALS — BP 124/82 | HR 95 | Temp 99.4°F | Ht 71.0 in | Wt 212.0 lb

## 2017-04-07 DIAGNOSIS — J209 Acute bronchitis, unspecified: Secondary | ICD-10-CM | POA: Diagnosis not present

## 2017-04-07 DIAGNOSIS — J0140 Acute pansinusitis, unspecified: Secondary | ICD-10-CM | POA: Diagnosis not present

## 2017-04-07 DIAGNOSIS — R509 Fever, unspecified: Secondary | ICD-10-CM

## 2017-04-07 MED ORDER — SALINE SPRAY 0.65 % NA SOLN: 1.0000 | NASAL | 0 refills | Status: DC | PRN

## 2017-04-07 MED ORDER — BENZONATATE 100 MG PO CAPS: 100.0000 mg | ORAL_CAPSULE | Freq: Three times a day (TID) | ORAL | 0 refills | Status: DC | PRN

## 2017-04-07 MED ORDER — FLUTICASONE PROPIONATE 50 MCG/ACT NA SUSP: 2.0000 | Freq: Every day | NASAL | 0 refills | Status: DC

## 2017-04-07 MED ORDER — LEVOFLOXACIN 500 MG PO TABS: 500.0000 mg | ORAL_TABLET | Freq: Every day | ORAL | 0 refills | Status: DC

## 2017-04-07 MED ORDER — ALBUTEROL SULFATE HFA 108 (90 BASE) MCG/ACT IN AERS: 1.0000 | INHALATION_SPRAY | Freq: Four times a day (QID) | RESPIRATORY_TRACT | 0 refills | Status: DC | PRN

## 2017-04-07 NOTE — Progress Notes (Signed)
Subjective:  Patient ID: [name], male    DOB: [birth year]  Age: 66 y.o. MRN: [id number]  CC: Cough (coughing green mucus,feel like there is a cut insdie throat-took tussin for cough--1 wk/ pt stated he black out twice yesterday--fall twice this month?? )   Cough  This is a recurrent problem. The current episode started 1 to 4 weeks ago. The problem has been waxing and waning. The problem occurs constantly. The cough is productive of purulent sputum. Associated symptoms include chills, ear congestion, ear pain, a fever, myalgias, nasal congestion, postnasal drip, rhinorrhea, a sore throat and shortness of breath. Pertinent negatives include no chest pain, heartburn, hemoptysis or wheezing. The symptoms are aggravated by lying down. He has tried body position changes and OTC cough suppressant for the symptoms. The treatment provided mild relief. His past medical history is significant for bronchitis and environmental allergies.    Outpatient Medications Prior to Visit  Medication Sig Dispense Refill  . amLODipine (NORVASC) 5 MG tablet Take 2 tablets (10 mg total) by mouth daily. 90 tablet 3  . cetirizine (ZYRTEC) 10 MG tablet Take 10 mg by mouth as needed.    . chlorthalidone (HYGROTON) 25 MG tablet Take 0.5 tablets (12.5 mg total) by mouth daily. 90 tablet 1  . clopidogrel (PLAVIX) 75 MG tablet TAKE ONE TABLET BY MOUTH ONCE DAILY 90 tablet 1  . esomeprazole (NEXIUM) 40 MG capsule Take 1 capsule (40 mg total) by mouth daily. 90 capsule 3  . glucose blood (PRODIGY NO CODING BLOOD GLUC) test strip Use one strip to test blood sugar four times a day Dx E11.9 120 each 11  . Insulin Glargine (LANTUS SOLOSTAR) 100 UNIT/ML Solostar Pen Inject 25 Units into the skin every morning. 15 mL   . Insulin Pen Needle 31G X 6 MM MISC Use to administer lantus insulin at bedtime 90 each 3  . metoprolol succinate (TOPROL-XL) 50 MG 24 hr tablet Take 1 tablet (50 mg total) by mouth daily. Take with or  immediately following a meal. 90 tablet 3  . pravastatin (PRAVACHOL) 20 MG tablet TAKE ONE TABLET BY MOUTH ONCE DAILY 90 tablet 2  . PRODIGY TWIST TOP LANCETS 28G MISC USE ONE LANCET TO CHECK GLUCOSE 4 TIMES DAILY Dx E11.9 200 each 5  . senna-docusate (SENOKOT-S) 8.6-50 MG per tablet Take 2 tablets by mouth 2 (two) times daily. For constipation.    . valsartan (DIOVAN) 320 MG tablet Take 1 tablet (320 mg total) by mouth daily. 90 tablet 2  . insulin lispro (HUMALOG KWIKPEN) 100 UNIT/ML KiwkPen Inject 0.08 mLs (8 Units total) into the skin daily with breakfast. And pen needles 2/day (Patient not taking: Reported on 04/07/2017) 15 mL 11   No facility-administered medications prior to visit.     ROS See HPI  Objective:  BP 124/82   Pulse 95   Temp 99.4 F (37.4 C)   Ht 5\' 11"  (1.803 m)   Wt 212 lb (96.2 kg)   SpO2 98%   BMI 29.57 kg/m   BP Readings from Last 3 Encounters:  04/07/17 124/82  03/23/17 (!) 143/85  03/16/17 138/88    Wt Readings from Last 3 Encounters:  04/07/17 212 lb (96.2 kg)  03/16/17 218 lb (98.9 kg)  02/16/17 221 lb (100.2 kg)    Physical Exam  Constitutional: He is oriented to person, place, and time. No distress.  HENT:  Right Ear: Tympanic membrane, external ear and ear canal normal.  Left Ear: Tympanic membrane  and ear canal normal.  Nose: Mucosal edema and rhinorrhea present. Right sinus exhibits maxillary sinus tenderness and frontal sinus tenderness. Left sinus exhibits maxillary sinus tenderness and frontal sinus tenderness.  Mouth/Throat: Uvula is midline. Posterior oropharyngeal erythema present. No oropharyngeal exudate.  Eyes: No scleral icterus.  Neck: Normal range of motion. Neck supple.  Cardiovascular: Normal rate and regular rhythm.   Pulmonary/Chest: Effort normal and breath sounds normal. No respiratory distress.  Musculoskeletal: He exhibits edema.  Lymphadenopathy:    He has cervical adenopathy.  Neurological: He is alert and  oriented to person, place, and time.  Vitals reviewed.   Lab Results  Component Value Date   WBC 6.8 02/16/2017   HGB 11.8 (L) 02/16/2017   HCT 34.9 (L) 02/16/2017   PLT 224.0 02/16/2017   GLUCOSE 256 (H) 03/01/2017   CHOL 170 01/12/2016   TRIG 215.0 (H) 01/12/2016   HDL 44.30 01/12/2016   LDLDIRECT 63.0 01/12/2016   LDLCALC 77 08/07/2015   ALT 18 02/16/2017   AST 36 02/16/2017   NA 139 03/01/2017   K 3.9 03/01/2017   CL 103 03/01/2017   CREATININE 1.62 (H) 03/01/2017   BUN 26 (H) 03/01/2017   CO2 31 03/01/2017   TSH 2.14 08/17/2016   PSA 0.14 08/17/2016   INR 1.06 08/14/2012   HGBA1C 9.7 (H) 02/16/2017   MICROALBUR 7.5 (H) 08/17/2016    No results found.  Assessment & Plan:   Helen was seen today for cough.  Diagnoses and all orders for this visit:  Acute non-recurrent pansinusitis -     fluticasone (FLONASE) 50 MCG/ACT nasal spray; Place 2 sprays into both nostrils daily. -     albuterol (PROVENTIL HFA;VENTOLIN HFA) 108 (90 Base) MCG/ACT inhaler; Inhale 1-2 puffs into the lungs every 6 (six) hours as needed for wheezing or shortness of breath. -     benzonatate (TESSALON) 100 MG capsule; Take 1 capsule (100 mg total) by mouth 3 (three) times daily as needed for cough. -     sodium chloride (OCEAN) 0.65 % SOLN nasal spray; Place 1 spray into both nostrils as needed for congestion. -     levofloxacin (LEVAQUIN) 500 MG tablet; Take 1 tablet (500 mg total) by mouth daily.  Acute bronchitis, unspecified organism -     DG Chest 2 View; Future -     levofloxacin (LEVAQUIN) 500 MG tablet; Take 1 tablet (500 mg total) by mouth daily.  Fever and chills -     DG Chest 2 View; Future -     levofloxacin (LEVAQUIN) 500 MG tablet; Take 1 tablet (500 mg total) by mouth daily.   I am having Mr. Blundell start on fluticasone, albuterol, benzonatate, sodium chloride, and levofloxacin. I am also having him maintain his senna-docusate, cetirizine, esomeprazole, PRODIGY TWIST  TOP LANCETS 28G, glucose blood, pravastatin, Insulin Pen Needle, metoprolol succinate, valsartan, amLODipine, chlorthalidone, clopidogrel, Insulin Glargine, and insulin lispro.  Meds ordered this encounter  Medications  . fluticasone (FLONASE) 50 MCG/ACT nasal spray    Sig: Place 2 sprays into both nostrils daily.    Dispense:  16 g    Refill:  0    Order Specific Question:   Supervising Provider    Answer:   Cassandria Anger [[id number]]  . albuterol (PROVENTIL HFA;VENTOLIN HFA) 108 (90 Base) MCG/ACT inhaler    Sig: Inhale 1-2 puffs into the lungs every 6 (six) hours as needed for wheezing or shortness of breath.    Dispense:  1 Inhaler  Refill:  0    Order Specific Question:   Supervising Provider    Answer:   Cassandria Anger [[id number]]  . benzonatate (TESSALON) 100 MG capsule    Sig: Take 1 capsule (100 mg total) by mouth 3 (three) times daily as needed for cough.    Dispense:  20 capsule    Refill:  0    Order Specific Question:   Supervising Provider    Answer:   Cassandria Anger [[id number]]  . sodium chloride (OCEAN) 0.65 % SOLN nasal spray    Sig: Place 1 spray into both nostrils as needed for congestion.    Dispense:  15 mL    Refill:  0    Order Specific Question:   Supervising Provider    Answer:   Cassandria Anger [[id number]]  . levofloxacin (LEVAQUIN) 500 MG tablet    Sig: Take 1 tablet (500 mg total) by mouth daily.    Dispense:  5 tablet    Refill:  0    Order Specific Question:   Supervising Provider    Answer:   Cassandria Anger [[id number]]    Follow-up: Return if symptoms worsen or fail to improve.  Wilfred Lacy, NP

## 2017-04-07 NOTE — Patient Instructions (Addendum)
Dr. Cordelia Pen number: [phone].  Go to basement for CXR. You will be contacted with results. Stop use of Tussin OTC

## 2017-04-08 ENCOUNTER — Telehealth: Payer: Self-pay | Admitting: Internal Medicine

## 2017-04-08 NOTE — Telephone Encounter (Signed)
Pt albuterol (PROVENTIL HFA;VENTOLIN HFA) 108 (90 Base) MCG/ACT inhaler is not covered by insurance, the Proventil specifically   Ventolin and Proair are covered by insurance which are albuterol inhalers as well.   They need a new rx done to the pharmacy

## 2017-04-11 ENCOUNTER — Other Ambulatory Visit: Payer: Self-pay | Admitting: Emergency Medicine

## 2017-04-11 ENCOUNTER — Telehealth: Payer: Self-pay | Admitting: Internal Medicine

## 2017-04-11 MED ORDER — AMLODIPINE BESYLATE 5 MG PO TABS: 10.0000 mg | ORAL_TABLET | Freq: Every day | ORAL | 3 refills | Status: DC

## 2017-04-11 MED ORDER — VENTOLIN HFA 108 (90 BASE) MCG/ACT IN AERS: 1.0000 | INHALATION_SPRAY | Freq: Four times a day (QID) | RESPIRATORY_TRACT | 11 refills | Status: DC | PRN

## 2017-04-11 NOTE — Telephone Encounter (Signed)
Pt called and said that his pharmacy never received the increased prescription for AmLODipine 5 MG tablet (2 po qd). They only gave him enough to last until today. Can this be resent? Please advise.

## 2017-04-11 NOTE — Telephone Encounter (Signed)
Ventolin sent

## 2017-04-14 ENCOUNTER — Telehealth: Payer: Self-pay | Admitting: Nurse Practitioner

## 2017-04-14 NOTE — Telephone Encounter (Signed)
Cough due to bronchitis can linger for up to 4-6weeks. In the absence of fever and worsening symptoms, continue albuterol, robitussin DM or delsym for cough. Maintain adequate oral hydration with water mostly.

## 2017-04-14 NOTE — Telephone Encounter (Signed)
Pt verbalize understand of test result.

## 2017-04-14 NOTE — Telephone Encounter (Signed)
Pt stated he feel a little better but still congested and wheezing in his chest. Please advise.

## 2017-04-14 NOTE — Telephone Encounter (Signed)
Pt called wanting to let [name] know that he completed his antibiotics on Monday but is still not feeling better. He was told to call and let us know if his symptoms did not improve. Please advise.

## 2017-04-15 ENCOUNTER — Ambulatory Visit (INDEPENDENT_AMBULATORY_CARE_PROVIDER_SITE_OTHER): Payer: Medicare Other | Admitting: Endocrinology

## 2017-04-15 ENCOUNTER — Encounter: Payer: Self-pay | Admitting: Endocrinology

## 2017-04-15 VITALS — BP 110/70 | HR 89 | Ht 71.0 in | Wt 210.0 lb

## 2017-04-15 DIAGNOSIS — E1140 Type 2 diabetes mellitus with diabetic neuropathy, unspecified: Secondary | ICD-10-CM | POA: Diagnosis not present

## 2017-04-15 DIAGNOSIS — IMO0002 Reserved for concepts with insufficient information to code with codable children: Secondary | ICD-10-CM

## 2017-04-15 DIAGNOSIS — E1165 Type 2 diabetes mellitus with hyperglycemia: Secondary | ICD-10-CM

## 2017-04-15 LAB — POCT GLYCOSYLATED HEMOGLOBIN (HGB A1C): Hemoglobin A1C: 9

## 2017-04-15 NOTE — Patient Instructions (Signed)
Please continue the same lantus Please come back for a follow-up appointment in 3 months blood tests are requested for you today.  We'll let you know about the results. check your blood sugar twice a day.  vary the time of day when you check, between before the 3 meals, and at bedtime.  also check if you have symptoms of your blood sugar being too high or too low.  please keep a record of the readings and bring it to your next appointment here (or you can bring the meter itself).  You can write it on any piece of paper.  please call us sooner if your blood sugar goes below 70, or if you have a lot of readings over 200.

## 2017-04-15 NOTE — Progress Notes (Signed)
Subjective:    Patient ID: [name], male    DOB: 1951-02-05, 66 y.o.   MRN: [id number]  HPI Pt returns for f/u of diabetes mellitus: DM type: Insulin-requiring type 2 Dx'ed: 4098 Complications: polyneuropathy, CAD, renal insufficiency, retinopathy, and TIA Therapy: insulin since 2011 DKA: never Severe hypoglycemia: never.  Pancreatitis: never.  Other: he takes qd insulin--see below.  Interval history: He does not take the humalog, as the [place] did not pay for pens.  He says he cannot use syringe and vial, due to tremor.  Therefore, he takes lantus only, 25 units qam.  no cbg record, but states cbg's vary from 70-159.  There is no trend throughout the day.  Past Medical History:  Diagnosis Date  . ALLERGIC RHINITIS   . CAD, NATIVE VESSEL    BMS to OM1 2001, DES to BMS 2005  . Chronic back pain    herniated disc  . Chronic combined systolic and diastolic heart failure ([place]) 06/29/2016  . Constipation    takes Carafate four times day  . DIAB W/UNSPEC COMP TYPE II/UNSPEC TYPE UNCNTRL   . ERECTILE DYSFUNCTION   . GERD   . HYPERLIPIDEMIA-MIXED   . HYPERTENSION, BENIGN   . LBBB (left bundle branch block) 02/02/2016  . MORTON'S NEUROMA, RIGHT   . Peripheral neuropathy   . Seasonal allergies    takes Allegra and Benadryl daily prn;uses Flonase daily  . SHOULDER PAIN, RIGHT   . 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 TIA on medication 02/2012    Past Surgical History:  Procedure Laterality Date  . ANGIOPLASTY    . COLONOSCOPY    . CORONARY ANGIOPLASTY  2005   2 stents  . DENTAL SURGERY    . LARYNGOPLASTY  08/07/2012   Procedure: LARYNGOPLASTY;  Surgeon: Izora Gala, MD;  Location: [facility];  Service: ENT;  Laterality: Left;  Left Vocal Cord Medialyzation  . left knee surgury     x 2   . stent  2001, 2004   coronary stents    Social History   Social History  . Marital status: Legally Separated    Spouse name: N/A  . Number of children: 3    . Years of education: college   Occupational History  . disabled Disability   Social History Main Topics  . Smoking status: Former Smoker    Quit date: 02/24/1983  . Smokeless tobacco: Never Used  . Alcohol use No  . Drug use: No  . Sexual activity: No   Other Topics Concern  . Not on file   Social History Narrative   Married, lives in [place] with wife. He is disabled secondary to back pain. 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   Patient has 3 children.   Patient has college education.   Patient drinks 1 cup daily.    Current Outpatient Prescriptions on File Prior to Visit  Medication Sig Dispense Refill  . amLODipine (NORVASC) 5 MG tablet Take 2 tablets (10 mg total) by mouth daily. 90 tablet 3  . benzonatate (TESSALON) 100 MG capsule Take 1 capsule (100 mg total) by mouth 3 (three) times daily as needed for cough. 20 capsule 0  . cetirizine (ZYRTEC) 10 MG tablet Take 10 mg by mouth as needed.    . chlorthalidone (HYGROTON) 25 MG tablet Take 0.5 tablets (12.5 mg total) by mouth daily. 90 tablet 1  . clopidogrel (PLAVIX)  75 MG tablet TAKE ONE TABLET BY MOUTH ONCE DAILY 90 tablet 1  . esomeprazole (NEXIUM) 40 MG capsule Take 1 capsule (40 mg total) by mouth daily. 90 capsule 3  . fluticasone (FLONASE) 50 MCG/ACT nasal spray Place 2 sprays into both nostrils daily. 16 g 0  . glucose blood (PRODIGY NO CODING BLOOD GLUC) test strip Use one strip to test blood sugar four times a day Dx E11.9 120 each 11  . Insulin Glargine (LANTUS SOLOSTAR) 100 UNIT/ML Solostar Pen Inject 25 Units into the skin every morning. 15 mL   . Insulin Pen Needle 31G X 6 MM MISC Use to administer lantus insulin at bedtime 90 each 3  . metoprolol succinate (TOPROL-XL) 50 MG 24 hr tablet Take 1 tablet (50 mg total) by mouth daily. Take with or immediately following a meal. 90 tablet 3  . pravastatin (PRAVACHOL) 20 MG tablet TAKE ONE TABLET BY MOUTH ONCE DAILY 90 tablet 2  . PRODIGY  TWIST TOP LANCETS 28G MISC USE ONE LANCET TO CHECK GLUCOSE 4 TIMES DAILY Dx E11.9 200 each 5  . senna-docusate (SENOKOT-S) 8.6-50 MG per tablet Take 2 tablets by mouth 2 (two) times daily. For constipation.    . sodium chloride (OCEAN) 0.65 % SOLN nasal spray Place 1 spray into both nostrils as needed for congestion. 15 mL 0  . valsartan (DIOVAN) 320 MG tablet Take 1 tablet (320 mg total) by mouth daily. 90 tablet 2  . VENTOLIN HFA 108 (90 Base) MCG/ACT inhaler Inhale 1-2 puffs into the lungs every 6 (six) hours as needed for wheezing or shortness of breath. 1 Inhaler 11   No current facility-administered medications on file prior to visit.     Allergies  Allergen Reactions  . Penicillins Anaphylaxis  . Pneumococcal Vaccines Anaphylaxis  . Shellfish Allergy Anaphylaxis  . Influenza Vaccines   . Lisinopril     cough  . Topiramate Other (See Comments)    Chest spasms and numbness    Family History  Problem Relation Age of Onset  . Cancer Mother     BP 110/70   Pulse 89   Ht 5\' 11"  (1.803 m)   Wt 210 lb (95.3 kg)   SpO2 96%   BMI 29.29 kg/m    Review of Systems He denies hypoglycemia.      Objective:   Physical Exam VITAL SIGNS:  See vs page GENERAL: no distress Pulses: dorsalis pedis intact bilat.   MSK: no deformity of the feet CV: no leg edema.   Skin:  no ulcer on the feet.  normal color and temp on the feet. Neuro: sensation is intact to touch on the feet, but decreased from normal.    Lab Results  Component Value Date   HGBA1C 9.0 04/15/2017      Assessment & Plan:  Insulin-requiring type 2 DM, with CAD: uncertain control. Check fructosamine  Patient Instructions  Please continue the same lantus Please come back for a follow-up appointment in 3 months blood tests are requested for you today.  We'll let you know about the results. check your blood sugar twice a day.  vary the time of day when you check, between before the 3 meals, and at bedtime.  also  check if you have symptoms of your blood sugar being too high or too low.  please keep a record of the readings and bring it to your next appointment here (or you can bring the meter itself).  You can write it on  any piece of paper.  please call us sooner if your blood sugar goes below 70, or if you have a lot of readings over 200.

## 2017-04-19 LAB — FRUCTOSAMINE: Fructosamine: 435 umol/L — ABNORMAL HIGH (ref 190–270)

## 2017-05-14 ENCOUNTER — Other Ambulatory Visit: Payer: Self-pay | Admitting: Internal Medicine

## 2017-06-28 ENCOUNTER — Telehealth: Payer: Self-pay | Admitting: Emergency Medicine

## 2017-06-28 MED ORDER — LOSARTAN POTASSIUM 100 MG PO TABS: 100.0000 mg | ORAL_TABLET | Freq: Every day | ORAL | 3 refills | Status: DC

## 2017-06-28 NOTE — Telephone Encounter (Signed)
Received fax from pharm requesting alternative for Valsartan, please advise.

## 2017-06-28 NOTE — Telephone Encounter (Signed)
Losartan 100 mg sent

## 2017-06-30 NOTE — Telephone Encounter (Signed)
LVM informing pt

## 2017-07-18 ENCOUNTER — Encounter: Payer: Self-pay | Admitting: Endocrinology

## 2017-07-18 ENCOUNTER — Ambulatory Visit (INDEPENDENT_AMBULATORY_CARE_PROVIDER_SITE_OTHER): Payer: Medicare Other | Admitting: Endocrinology

## 2017-07-18 VITALS — BP 128/72 | HR 87 | Ht 71.0 in | Wt 216.0 lb

## 2017-07-18 DIAGNOSIS — E1165 Type 2 diabetes mellitus with hyperglycemia: Secondary | ICD-10-CM

## 2017-07-18 DIAGNOSIS — E1140 Type 2 diabetes mellitus with diabetic neuropathy, unspecified: Secondary | ICD-10-CM | POA: Diagnosis not present

## 2017-07-18 DIAGNOSIS — IMO0002 Reserved for concepts with insufficient information to code with codable children: Secondary | ICD-10-CM

## 2017-07-18 LAB — POCT GLYCOSYLATED HEMOGLOBIN (HGB A1C): Hemoglobin A1C: 8.3

## 2017-07-18 MED ORDER — INSULIN ISOPHANE HUMAN 100 UNIT/ML KWIKPEN: 30.0000 [IU] | PEN_INJECTOR | SUBCUTANEOUS | 11 refills | Status: DC

## 2017-07-18 NOTE — Progress Notes (Signed)
Subjective:    Patient ID: [name], male    DOB: 07/17/1951, 66 y.o.   MRN: [id number]  HPI Pt returns for f/u of diabetes mellitus: DM type: Insulin-requiring type 2 Dx'ed: 0277 Complications: polyneuropathy, CAD, renal insufficiency, retinopathy, and TIA.   Therapy: insulin since 2011 DKA: never.  Severe hypoglycemia: never.  Pancreatitis: never.  Other: He says he cannot use syringe and vial, due to tremor.  [facility] history: [facility].  Therefore, he takes lantus only, 30 units qam.  no cbg record, but states cbg's vary from 68-159.  It is in general higher as the day goes on.  He declines multiple daily injections.   Past Medical History:  Diagnosis Date  . ALLERGIC RHINITIS   . CAD, NATIVE VESSEL    BMS to OM1 2001, DES to BMS 2005  . Chronic back pain    herniated disc  . Chronic combined systolic and diastolic heart failure ([place]) 06/29/2016  . Constipation    takes Carafate four times day  . DIAB W/UNSPEC COMP TYPE II/UNSPEC TYPE UNCNTRL   . ERECTILE DYSFUNCTION   . GERD   . HYPERLIPIDEMIA-MIXED   . HYPERTENSION, BENIGN   . LBBB (left bundle branch block) 02/02/2016  . MORTON'S NEUROMA, RIGHT   . Peripheral neuropathy   . Seasonal allergies    takes Allegra and Benadryl daily prn;uses Flonase daily  . SHOULDER PAIN, RIGHT   . 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 TIA on medication 02/2012    Past Surgical History:  Procedure Laterality Date  . ANGIOPLASTY    . COLONOSCOPY    . CORONARY ANGIOPLASTY  2005   2 stents  . DENTAL SURGERY    . LARYNGOPLASTY  08/07/2012   Procedure: LARYNGOPLASTY;  Surgeon: Izora Gala, MD;  Location: [facility];  Service: ENT;  Laterality: Left;  Left Vocal Cord Medialyzation  . left knee surgury     x 2   . stent  2001, 2004   coronary stents    Social History   Social History  . Marital status: Legally Separated    Spouse name: N/A  . Number of children: 3  . Years of  education: college   Occupational History  . disabled Disability   Social History Main Topics  . Smoking status: Former Smoker    Quit date: 02/24/1983  . Smokeless tobacco: Never Used  . Alcohol use No  . Drug use: No  . Sexual activity: No   Other Topics Concern  . Not on file   Social History Narrative   Married, lives in [place] with wife. He is disabled secondary to back pain. 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   Patient has 3 children.   Patient has college education.   Patient drinks 1 cup daily.    Current Outpatient Prescriptions on File Prior to Visit  Medication Sig Dispense Refill  . amLODipine (NORVASC) 5 MG tablet Take 2 tablets (10 mg total) by mouth daily. 90 tablet 3  . benzonatate (TESSALON) 100 MG capsule Take 1 capsule (100 mg total) by mouth 3 (three) times daily as needed for cough. 20 capsule 0  . cetirizine (ZYRTEC) 10 MG tablet Take 10 mg by mouth as needed.    . chlorthalidone (HYGROTON) 25 MG tablet Take 0.5 tablets (12.5 mg total) by mouth daily. 90 tablet 1  . clopidogrel (PLAVIX) 75  MG tablet TAKE ONE TABLET BY MOUTH ONCE DAILY 90 tablet 1  . esomeprazole (NEXIUM) 40 MG capsule Take 1 capsule (40 mg total) by mouth daily. 90 capsule 3  . fluticasone (FLONASE) 50 MCG/ACT nasal spray Place 2 sprays into both nostrils daily. 16 g 0  . glucose blood (PRODIGY NO CODING BLOOD GLUC) test strip Use one strip to test blood sugar four times a day Dx E11.9 120 each 11  . Insulin Pen Needle 31G X 6 MM MISC Use to administer lantus insulin at bedtime 90 each 3  . losartan (COZAAR) 100 MG tablet Take 1 tablet (100 mg total) by mouth daily. 90 tablet 3  . metoprolol succinate (TOPROL-XL) 50 MG 24 hr tablet Take 1 tablet (50 mg total) by mouth daily. Take with or immediately following a meal. 90 tablet 3  . pravastatin (PRAVACHOL) 20 MG tablet TAKE ONE TABLET BY MOUTH ONCE DAILY 90 tablet 1  . PRODIGY TWIST TOP LANCETS 28G MISC USE ONE  LANCET TO CHECK GLUCOSE 4 TIMES DAILY Dx E11.9 200 each 5  . senna-docusate (SENOKOT-S) 8.6-50 MG per tablet Take 2 tablets by mouth 2 (two) times daily. For constipation.    . sodium chloride (OCEAN) 0.65 % SOLN nasal spray Place 1 spray into both nostrils as needed for congestion. 15 mL 0  . VENTOLIN HFA 108 (90 Base) MCG/ACT inhaler Inhale 1-2 puffs into the lungs every 6 (six) hours as needed for wheezing or shortness of breath. 1 Inhaler 11   No current facility-administered medications on file prior to visit.     Allergies  Allergen Reactions  . Penicillins Anaphylaxis  . Pneumococcal Vaccines Anaphylaxis  . Shellfish Allergy Anaphylaxis  . Influenza Vaccines   . Lisinopril     cough  . Topiramate Other (See Comments)    Chest spasms and numbness    Family History  Problem Relation Age of Onset  . Cancer Mother     BP 128/72   Pulse 87   Ht 5\' 11"  (1.803 m)   Wt 216 lb (98 kg)   SpO2 95%   BMI 30.13 kg/m    Review of Systems Denies LOC.     Objective:   Physical Exam VITAL SIGNS:  See vs page GENERAL: no distress Pulses: foot pulses are intact bilaterally.   MSK: no deformity of the feet or ankles.  CV: no edema of the legs or ankles Skin:  no ulcer on the feet or ankles.  normal color and temp on the feet and ankles Neuro: sensation is intact to touch on the feet and ankles, but decreased from normal.     A1c=8.3%    Assessment & Plan:  Insulin-requiring type 2 DM, with TIA: he needs increased rx, if it can be done with a regimen that avoids or minimizes hypoglycemia. Hypoglycemia: he should have a faster-acting qd insulin.   Tremor: he is not a good candidate for syringe and vial.   Patient Instructions  Please change the lantus to NPH, 30 units each morning. On this type of insulin schedule, you should eat meals on a regular schedule.  If a meal is missed or significantly delayed, your blood sugar could go low.   Please come back for a follow-up  appointment in 3 months.  check your blood sugar twice a day.  vary the time of day when you check, between before the 3 meals, and at bedtime.  also check if you have symptoms of your blood sugar being  too high or too low.  please keep a record of the readings and bring it to your next appointment here (or you can bring the meter itself).  You can write it on any piece of paper.  please call us sooner if your blood sugar goes below 70, or if you have a lot of readings over 200.

## 2017-07-18 NOTE — Patient Instructions (Addendum)
Please change the lantus to NPH, 30 units each morning. On this type of insulin schedule, you should eat meals on a regular schedule.  If a meal is missed or significantly delayed, your blood sugar could go low.   Please come back for a follow-up appointment in 3 months.  check your blood sugar twice a day.  vary the time of day when you check, between before the 3 meals, and at bedtime.  also check if you have symptoms of your blood sugar being too high or too low.  please keep a record of the readings and bring it to your next appointment here (or you can bring the meter itself).  You can write it on any piece of paper.  please call us sooner if your blood sugar goes below 70, or if you have a lot of readings over 200.

## 2017-07-19 ENCOUNTER — Ambulatory Visit: Payer: Medicare Other | Admitting: Cardiovascular Disease

## 2017-07-19 NOTE — Progress Notes (Deleted)
Cardiology Office Note   Date:  07/19/2017   ID:  [name], DOB [birth year], MRN [id number]  PCP:  Mike Rail, MD  Cardiologist:   Mike Latch, MD   No chief complaint on file.    History of Present Illness: [name] is a 66 y.o. male with hypertension, CAD status post PCI, stroke, diabetes mellitus type 2, hyperlipidemia, and prior tobacco abuse who presents for follow up.  [name] was referred by [name] on 02/09/16.  At that time he reported occasional episodes of chest pain relieved by aspirin.  He denied exertional chest pain but did note dizziness and nausea.  Additionally, he was noted to have a left bundle branch block that was not present on his last EKG in 2013. Therefore, he was referred for exercise Myoview 02/13/16 that revealed LVEF 39% with global hypokinesis but no ischemia.  Echo 03/01/16 revealed LV 40-45% with mild LVH and grade 2 diastolic dysfunction. There was also mild hypokinesis of the mid to apical anteroseptum.   He works with our pharmacists and has achieved very good blood pressure control.  He is in a program at the [place] that checks his BP daily.    ?still exercising    Past Medical History:  Diagnosis Date  . ALLERGIC RHINITIS   . CAD, NATIVE VESSEL    BMS to OM1 2001, DES to BMS 2005  . Chronic back pain    herniated disc  . Chronic combined systolic and diastolic heart failure ([place]) 06/29/2016  . Constipation    takes Carafate four times day  . DIAB W/UNSPEC COMP TYPE II/UNSPEC TYPE UNCNTRL   . ERECTILE DYSFUNCTION   . GERD   . HYPERLIPIDEMIA-MIXED   . HYPERTENSION, BENIGN   . LBBB (left bundle branch block) 02/02/2016  . MORTON'S NEUROMA, RIGHT   . Peripheral neuropathy   . Seasonal allergies    takes Allegra and Benadryl daily prn;uses Flonase daily  . SHOULDER PAIN, RIGHT   . 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 TIA on medication 02/2012    Past Surgical History:    Procedure Laterality Date  . ANGIOPLASTY    . COLONOSCOPY    . CORONARY ANGIOPLASTY  2005   2 stents  . DENTAL SURGERY    . LARYNGOPLASTY  08/07/2012   Procedure: LARYNGOPLASTY;  Surgeon: Mike Gala, MD;  Location: [facility];  Service: ENT;  Laterality: Left;  Left Vocal Cord Medialyzation  . left knee surgury     x 2   . stent  2001, 2004   coronary stents     Current Outpatient Prescriptions  Medication Sig Dispense Refill  . amLODipine (NORVASC) 5 MG tablet Take 2 tablets (10 mg total) by mouth daily. 90 tablet 3  . benzonatate (TESSALON) 100 MG capsule Take 1 capsule (100 mg total) by mouth 3 (three) times daily as needed for cough. 20 capsule 0  . cetirizine (ZYRTEC) 10 MG tablet Take 10 mg by mouth as needed.    . chlorthalidone (HYGROTON) 25 MG tablet Take 0.5 tablets (12.5 mg total) by mouth daily. 90 tablet 1  . clopidogrel (PLAVIX) 75 MG tablet TAKE ONE TABLET BY MOUTH ONCE DAILY 90 tablet 1  . clotrimazole-betamethasone (LOTRISONE) cream Apply 1 application topically 2 (two) times daily.    Marland Kitchen esomeprazole (NEXIUM) 40 MG capsule Take 1 capsule (40 mg total) by mouth daily. 90 capsule 3  . fluticasone (FLONASE) 50  MCG/ACT nasal spray Place 2 sprays into both nostrils daily. 16 g 0  . glucose blood (PRODIGY NO CODING BLOOD GLUC) test strip Use one strip to test blood sugar four times a day Dx E11.9 120 each 11  . Insulin NPH, Human,, Isophane, (HUMULIN N KWIKPEN) 100 UNIT/ML Kiwkpen Inject 30 Units into the skin every morning. And pen needles 1/day 15 mL 11  . Insulin Pen Needle 31G X 6 MM MISC Use to administer lantus insulin at bedtime 90 each 3  . losartan (COZAAR) 100 MG tablet Take 1 tablet (100 mg total) by mouth daily. 90 tablet 3  . metoprolol succinate (TOPROL-XL) 50 MG 24 hr tablet Take 1 tablet (50 mg total) by mouth daily. Take with or immediately following a meal. 90 tablet 3  . pravastatin (PRAVACHOL) 20 MG tablet TAKE ONE TABLET BY MOUTH ONCE DAILY 90 tablet 1  .  PRODIGY TWIST TOP LANCETS 28G MISC USE ONE LANCET TO CHECK GLUCOSE 4 TIMES DAILY Dx E11.9 200 each 5  . senna-docusate (SENOKOT-S) 8.6-50 MG per tablet Take 2 tablets by mouth 2 (two) times daily. For constipation.    . sodium chloride (OCEAN) 0.65 % SOLN nasal spray Place 1 spray into both nostrils as needed for congestion. 15 mL 0  . VENTOLIN HFA 108 (90 Base) MCG/ACT inhaler Inhale 1-2 puffs into the lungs every 6 (six) hours as needed for wheezing or shortness of breath. 1 Inhaler 11   No current facility-administered medications for this visit.     Allergies:   Penicillins; Pneumococcal vaccines; Shellfish allergy; Influenza vaccines; Lisinopril; and Topiramate    Social History:  The patient  reports that he quit smoking about 34 years ago. He has never used smokeless tobacco. He reports that he does not drink alcohol or use drugs.   Family History:  The patient's family history includes Cancer in his mother.    ROS:  Please see the history of present illness.   Otherwise, review of systems are positive for L side weakness since his stroke and cough.  All other systems are reviewed and negative.    PHYSICAL EXAM: VS:  There were no vitals taken for this visit. , BMI There is no height or weight on file to calculate BMI. GENERAL:  Well appearing HEENT:  Pupils equal round and reactive, fundi not visualized, oral mucosa unremarkable NECK:  No jugular venous distention, waveform within normal limits, carotid upstroke brisk and symmetric, no bruits, no thyromegaly LYMPHATICS:  No cervical adenopathy LUNGS:  Clear to auscultation bilaterally HEART:  RRR.  PMI not displaced or sustained,S1 and S2 within normal limits, no S3, no S4, no clicks, no rubs, nourmurs ABD:  Flat, positive bowel sounds normal in frequency in pitch, no bruits, no rebound, no guarding, no midline pulsatile mass, no hepatomegaly, no splenomegaly EXT:  2 plus pulses throughout, no edema, no cyanosis no  clubbing SKIN:  No rashes no nodules NEURO:  Cranial nerves II through XII grossly intact, motor grossly intact throughout PSYCH:  Cognitively intact, oriented to person place and time   EKG:  EKG is ordered today. The ekg ordered today demonstrates sinus rhythm rate 64 bpm.  LBBB.    Echo 03/01/16: Study Conclusions  - Left ventricle: The cavity size was normal. Wall thickness was   increased in a pattern of mild LVH. There was focal basal   hypertrophy. Systolic function was mildly to moderately reduced.   The estimated ejection fraction was in the range of 40%  to 45%.   Mild hypokinesis of the mid-apicalanteroseptal myocardium.   Features are consistent with a pseudonormal left ventricular   filling pattern, with concomitant abnormal relaxation and   increased filling pressure (grade 2 diastolic dysfunction).  Carotid Doppler 10/24/14: Normal study  Lexiscan Myoview 02/13/16:  The left ventricular ejection fraction is moderately decreased (30-44%). Asynchronous contractile performance  Nuclear stress EF: 39%.  There was no ST segment deviation noted during stress.  This is an intermediate risk study. No ischemia identified. Ejection fraction is reduced when compared to prior study in 2013 (47%).  There is a rate related interventricular conduction delay, left bundle branch block.  Baseline ECG exhibits normal sinus rhythm.T-wave inversion 1, aVL, V1, V2 .  Recent Labs: 08/17/2016: TSH 2.14 02/16/2017: ALT 18; Hemoglobin 11.8; Platelets 224.0 03/01/2017: BUN 26; Creatinine, Ser 1.62; Potassium 3.9; Sodium 139    Lipid Panel    Component Value Date/Time   CHOL 170 01/12/2016 0931   TRIG 215.0 (H) 01/12/2016 0931   TRIG 200 04/20/2009   HDL 44.30 01/12/2016 0931   CHOLHDL 4 01/12/2016 0931   VLDL 43.0 (H) 01/12/2016 0931   LDLCALC 77 08/07/2015 1117   LDLDIRECT 63.0 01/12/2016 0931      Wt Readings from Last 3 Encounters:  07/18/17 98 kg (216 lb)  04/15/17 95.3  kg (210 lb)  04/07/17 96.2 kg (212 lb)      ASSESSMENT AND PLAN:  # CAD s/p PCI x2: Stable.  Continue plavix, atenolol, and pravastatin.  Stress was negative for ischemia  # Chronic systolic and diastolic heart failure: Mr. Kwan is euvolemic.  Continue atenolol and valsartan.  Consider switching atenolol to carvedilol for heart failure.  # Hypertension: BP is well-controlled on atenolol, chlorthalidone, and valsartan.  # Hyperlipidemia: Lipids well-controlled.  Continue pravastatin.  # Cough: Resolved off ACE-I.    Current medicines are reviewed at length with the patient today.  The patient does not have concerns regarding medicines.  The following changes have been made: No changes  Labs/ tests ordered today include:    No orders of the defined types were placed in this encounter.    Disposition:   FU with Martinique Pizzimenti C. Oval Linsey, MD, [facility] in 1 year   This note was written with the assistance of speech recognition software.  Please excuse any transcriptional errors.  Signed, Daliah Chaudoin C. Oval Linsey, MD, [facility]  07/19/2017 8:44 AM    [place]

## 2017-07-20 ENCOUNTER — Encounter: Payer: Self-pay | Admitting: Cardiovascular Disease

## 2017-07-20 ENCOUNTER — Ambulatory Visit (INDEPENDENT_AMBULATORY_CARE_PROVIDER_SITE_OTHER): Payer: Medicare Other | Admitting: Cardiovascular Disease

## 2017-07-20 VITALS — BP 108/68 | HR 74 | Ht 71.0 in | Wt 214.8 lb

## 2017-07-20 DIAGNOSIS — E785 Hyperlipidemia, unspecified: Secondary | ICD-10-CM | POA: Diagnosis not present

## 2017-07-20 DIAGNOSIS — E7800 Pure hypercholesterolemia, unspecified: Secondary | ICD-10-CM | POA: Diagnosis not present

## 2017-07-20 DIAGNOSIS — Z955 Presence of coronary angioplasty implant and graft: Secondary | ICD-10-CM

## 2017-07-20 DIAGNOSIS — Z9861 Coronary angioplasty status: Secondary | ICD-10-CM | POA: Diagnosis not present

## 2017-07-20 DIAGNOSIS — Z79899 Other long term (current) drug therapy: Secondary | ICD-10-CM

## 2017-07-20 DIAGNOSIS — I10 Essential (primary) hypertension: Secondary | ICD-10-CM | POA: Diagnosis not present

## 2017-07-20 DIAGNOSIS — I5042 Chronic combined systolic (congestive) and diastolic (congestive) heart failure: Secondary | ICD-10-CM

## 2017-07-20 NOTE — Progress Notes (Signed)
Cardiology Office Note   Date:  07/20/2017   ID:  [name], DOB Mar 31, 1951, MRN [id number]  PCP:  Mike Rail, MD  Cardiologist:   Mike Latch, MD   No chief complaint on file.    History of Present Illness: [name] is a 66 y.o. male with hypertension, CAD status post PCI, stroke, diabetes mellitus type 2, hyperlipidemia, and prior tobacco abuse who presents for follow up.  [name] was referred by Dr. Billey Gosling on 02/09/16.  At that time he reported occasional episodes of chest pain relieved by aspirin.  He denied exertional chest pain but did note dizziness and nausea.  Additionally, he was noted to have a left bundle branch block that was not present on his last EKG in 2013. Therefore, he was referred for exercise Myoview 02/13/16 that revealed LVEF 39% with global hypokinesis but no ischemia.  Echo 03/01/16 revealed LV 40-45% with mild LVH and grade 2 diastolic dysfunction. There was also mild hypokinesis of the mid to apical anteroseptum.   He works with our pharmacists and has achieved very good blood pressure control.  He is in a program at the [place] that checks his BP daily.    Since his last appointment [name] has been doing OK.  He notes occasional chest pain that occurs after he eats.  He thinks that this is related to indigestion.  He denies exertional chest pain. He thought he was taking Nexium for acid reflux but is unsure if this is true.  He was recently switched from valsartan and losartan. He thinks that this is when his indigestion and started getting worse. He notes that it is worse after eating starchy foods or fractures. He has not been exercising as much lately because his personal trainer had to get another job. He is not exercising only once or twice per month. He also complains of pain in his left hip and buttocks. He additionally has pain under his left foot. He had 2 falls in the winter time that he attributed to losing his balance.  There was no preceding chest pain or palpitations. He has not noted any lower extremity edema, orthopnea, or PND.   Past Medical History:  Diagnosis Date  . ALLERGIC RHINITIS   . CAD, NATIVE VESSEL    BMS to OM1 2001, DES to BMS 2005  . Chronic back pain    herniated disc  . Chronic combined systolic and diastolic heart failure ([place]) 06/29/2016  . Constipation    takes Carafate four times day  . DIAB W/UNSPEC COMP TYPE II/UNSPEC TYPE UNCNTRL   . ERECTILE DYSFUNCTION   . GERD   . HYPERLIPIDEMIA-MIXED   . HYPERTENSION, BENIGN   . LBBB (left bundle branch block) 02/02/2016  . MORTON'S NEUROMA, RIGHT   . Peripheral neuropathy   . Seasonal allergies    takes Allegra and Benadryl daily prn;uses Flonase daily  . SHOULDER PAIN, RIGHT   . 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 TIA on medication 02/2012    Past Surgical History:  Procedure Laterality Date  . ANGIOPLASTY    . COLONOSCOPY    . CORONARY ANGIOPLASTY  2005   2 stents  . DENTAL SURGERY    . LARYNGOPLASTY  08/07/2012   Procedure: LARYNGOPLASTY;  Surgeon: Izora Gala, MD;  Location: [facility];  Service: ENT;  Laterality: Left;  Left Vocal Cord Medialyzation  . left knee surgury  x 2   . stent  2001, 2004   coronary stents     Current Outpatient Prescriptions  Medication Sig Dispense Refill  . amLODipine (NORVASC) 5 MG tablet Take 2 tablets (10 mg total) by mouth daily. 90 tablet 3  . benzonatate (TESSALON) 100 MG capsule Take 1 capsule (100 mg total) by mouth 3 (three) times daily as needed for cough. 20 capsule 0  . cetirizine (ZYRTEC) 10 MG tablet Take 10 mg by mouth as needed.    . chlorthalidone (HYGROTON) 25 MG tablet Take 0.5 tablets (12.5 mg total) by mouth daily. 90 tablet 1  . clopidogrel (PLAVIX) 75 MG tablet TAKE ONE TABLET BY MOUTH ONCE DAILY 90 tablet 1  . clotrimazole-betamethasone (LOTRISONE) cream Apply 1 application topically 2 (two) times daily.    Marland Kitchen esomeprazole (NEXIUM) 40  MG capsule Take 1 capsule (40 mg total) by mouth daily. 90 capsule 3  . fluticasone (FLONASE) 50 MCG/ACT nasal spray Place 2 sprays into both nostrils daily. 16 g 0  . glucose blood (PRODIGY NO CODING BLOOD GLUC) test strip Use one strip to test blood sugar four times a day Dx E11.9 120 each 11  . Insulin NPH, Human,, Isophane, (HUMULIN N KWIKPEN) 100 UNIT/ML Kiwkpen Inject 30 Units into the skin every morning. And pen needles 1/day 15 mL 11  . Insulin Pen Needle 31G X 6 MM MISC Use to administer lantus insulin at bedtime 90 each 3  . losartan (COZAAR) 100 MG tablet Take 1 tablet (100 mg total) by mouth daily. 90 tablet 3  . metoprolol succinate (TOPROL-XL) 50 MG 24 hr tablet Take 1 tablet (50 mg total) by mouth daily. Take with or immediately following a meal. 90 tablet 3  . pravastatin (PRAVACHOL) 20 MG tablet TAKE ONE TABLET BY MOUTH ONCE DAILY 90 tablet 1  . PRODIGY TWIST TOP LANCETS 28G MISC USE ONE LANCET TO CHECK GLUCOSE 4 TIMES DAILY Dx E11.9 200 each 5  . senna-docusate (SENOKOT-S) 8.6-50 MG per tablet Take 2 tablets by mouth 2 (two) times daily. For constipation.    . sodium chloride (OCEAN) 0.65 % SOLN nasal spray Place 1 spray into both nostrils as needed for congestion. 15 mL 0  . VENTOLIN HFA 108 (90 Base) MCG/ACT inhaler Inhale 1-2 puffs into the lungs every 6 (six) hours as needed for wheezing or shortness of breath. 1 Inhaler 11   No current facility-administered medications for this visit.     Allergies:   Penicillins; Pneumococcal vaccines; Shellfish allergy; Influenza vaccines; Lisinopril; and Topiramate    Social History:  The patient  reports that he quit smoking about 34 years ago. He has never used smokeless tobacco. He reports that he does not drink alcohol or use drugs.   Family History:  The patient's family history includes Cancer in his mother.    ROS:  Please see the history of present illness.   Otherwise, review of systems are positive for L side weakness  since his stroke and cough.  All other systems are reviewed and negative.    PHYSICAL EXAM: VS:  BP 108/68   Pulse 74   Ht 5\' 11"  (1.803 m)   Wt 97.4 kg (214 lb 12.8 oz)   BMI 29.96 kg/m  , BMI Body mass index is 29.96 kg/m. GENERAL:  Well appearing HEENT: Pupils equal round and reactive, fundi not visualized, oral mucosa unremarkable NECK:  No jugular venous distention, waveform within normal limits, carotid upstroke brisk and symmetric, no bruits, no  thyromegaly LUNGS:  Clear to auscultation bilaterally HEART:  RRR.  PMI not displaced or sustained,S1 and S2 within normal limits, no S3, no S4, no clicks, no rubs, no murmurs ABD:  Flat, positive bowel sounds normal in frequency in pitch, no bruits, no rebound, no guarding, no midline pulsatile mass, no hepatomegaly, no splenomegaly EXT:  2 plus pulses throughout, no edema, no cyanosis no clubbing SKIN:  No rashes no nodules NEURO:  Cranial nerves II through XII grossly intact, motor grossly intact throughout.  L UE weakness and tremor.  PSYCH:  Cognitively intact, oriented to person place and time   EKG:  EKG is ordered today. The ekg ordered 3//13/17 demonstrates sinus rhythm rate 64 bpm.  LBBB.   07/20/17: Sinus rhythm. Rate 74 bpm. Left bundle branch block.  Echo 03/01/16: Study Conclusions  - Left ventricle: The cavity size was normal. Wall thickness was   increased in a pattern of mild LVH. There was focal basal   hypertrophy. Systolic function was mildly to moderately reduced.   The estimated ejection fraction was in the range of 40% to 45%.   Mild hypokinesis of the mid-apicalanteroseptal myocardium.   Features are consistent with a pseudonormal left ventricular   filling pattern, with concomitant abnormal relaxation and   increased filling pressure (grade 2 diastolic dysfunction).  Carotid Doppler 10/24/14: Normal study  Lexiscan Myoview 02/13/16:  The left ventricular ejection fraction is moderately decreased  (30-44%). Asynchronous contractile performance  Nuclear stress EF: 39%.  There was no ST segment deviation noted during stress.  This is an intermediate risk study. No ischemia identified. Ejection fraction is reduced when compared to prior study in 2013 (47%).  There is a rate related interventricular conduction delay, left bundle branch block.  Baseline ECG exhibits normal sinus rhythm.T-wave inversion 1, aVL, V1, V2 .   Recent Labs: 08/17/2016: TSH 2.14 02/16/2017: ALT 18; Hemoglobin 11.8; Platelets 224.0 03/01/2017: BUN 26; Creatinine, Ser 1.62; Potassium 3.9; Sodium 139    Lipid Panel    Component Value Date/Time   CHOL 170 01/12/2016 0931   TRIG 215.0 (H) 01/12/2016 0931   TRIG 200 04/20/2009   HDL 44.30 01/12/2016 0931   CHOLHDL 4 01/12/2016 0931   VLDL 43.0 (H) 01/12/2016 0931   LDLCALC 77 08/07/2015 1117   LDLDIRECT 63.0 01/12/2016 0931      Wt Readings from Last 3 Encounters:  07/20/17 97.4 kg (214 lb 12.8 oz)  07/18/17 98 kg (216 lb)  04/15/17 95.3 kg (210 lb)      ASSESSMENT AND PLAN:  # CAD s/p PCI x2: Stable.  Mr. Mirsky hasn't experienced any anginal chest pain.  Continue plavix, atenolol, and pravastatin.  Stress was negative for ischemia 01/2016.  His current CP seems more related to GERD.  .  # Chronic systolic and diastolic heart failure: Mr. Wile is euvolemic.  Continue metoprolol and losartan.    # Hypertension: BP is well-controlled on metoprolol, losartan, and amlodipine.  Ideally he would not be on amlodipine 2/2 chronic systolic and diastolic heart failure.    # Hyperlipidemia: Check lipids and CMP.  Continue pravastatin.    Current medicines are reviewed at length with the patient today.  The patient does not have concerns regarding medicines.  The following changes have been made: No changes  Labs/ tests ordered today include:    Orders Placed This Encounter  Procedures  . Lipid panel  . Comprehensive metabolic panel      Disposition:   FU with Samanatha Brammer C.  Oval Linsey, MD, [facility] in 1 year   This note was written with the assistance of speech recognition software.  Please excuse any transcriptional errors.  Signed, Joseff Luckman C. Oval Linsey, MD, [facility]  07/20/2017 11:49 AM    [place]

## 2017-07-20 NOTE — Patient Instructions (Signed)
Medication Instructions:  Your physician recommends that you continue on your current medications as directed. Please refer to the Current Medication list given to you today.  Labwork: LP/CMET TODAY   Testing/Procedures: NONE ORDERED  Follow-Up: Your physician recommends that you schedule a follow-up appointment in [place]. You will receive a reminder letter in the mail two months in advance. If you don't receive a letter, please call our office to schedule the follow-up appointment.  If you need a refill on your cardiac medications before your next appointment, please call your pharmacy.

## 2017-07-20 NOTE — Addendum Note (Signed)
Addended by: Alvina Filbert B on: 07/20/2017 05:04 PM   Modules accepted: Orders

## 2017-07-21 LAB — LIPID PANEL
Chol/HDL Ratio: 3 ratio (ref 0.0–5.0)
Cholesterol, Total: 164 mg/dL (ref 100–199)
HDL: 55 mg/dL (ref >39)
LDL Calculated: 82 mg/dL (ref 0–99)
Triglycerides: 133 mg/dL (ref 0–149)
VLDL Cholesterol Cal: 27 mg/dL (ref 5–40)

## 2017-07-21 LAB — COMPREHENSIVE METABOLIC PANEL
ALT: 25 IU/L (ref 0–44)
AST: 22 IU/L (ref 0–40)
Albumin/Globulin Ratio: 1.2 (ref 1.2–2.2)
Albumin: 4.3 g/dL (ref 3.6–4.8)
Alkaline Phosphatase: 72 IU/L (ref 39–117)
BUN/Creatinine Ratio: 15 (ref 10–24)
BUN: 24 mg/dL (ref 8–27)
Bilirubin Total: 0.4 mg/dL (ref 0.0–1.2)
CO2: 28 mmol/L (ref 20–29)
Calcium: 10 mg/dL (ref 8.6–10.2)
Chloride: 102 mmol/L (ref 96–106)
Creatinine, Ser: 1.57 mg/dL — ABNORMAL HIGH (ref 0.76–1.27)
GFR calc Af Amer: 52 mL/min/{1.73_m2} — ABNORMAL LOW (ref >59)
GFR calc non Af Amer: 45 mL/min/{1.73_m2} — ABNORMAL LOW (ref >59)
Globulin, Total: 3.6 g/dL (ref 1.5–4.5)
Glucose: 147 mg/dL — ABNORMAL HIGH (ref 65–99)
Potassium: 4.6 mmol/L (ref 3.5–5.2)
Sodium: 142 mmol/L (ref 134–144)
Total Protein: 7.9 g/dL (ref 6.0–8.5)

## 2017-07-26 ENCOUNTER — Telehealth: Payer: Self-pay | Admitting: *Deleted

## 2017-07-26 DIAGNOSIS — I10 Essential (primary) hypertension: Secondary | ICD-10-CM

## 2017-07-26 DIAGNOSIS — E785 Hyperlipidemia, unspecified: Secondary | ICD-10-CM

## 2017-07-26 MED ORDER — ATORVASTATIN CALCIUM 40 MG PO TABS: 40.0000 mg | ORAL_TABLET | Freq: Every day | ORAL | 5 refills | Status: DC

## 2017-07-26 NOTE — Telephone Encounter (Signed)
-----   Message from Skeet Latch, MD sent at 07/22/2017  1:05 PM EDT ----- LDL should be less than 70.  Please stop pravastatin and sart atorvastatin 40mg  daily.  Repeat lipids and CMP in 6 weeks.  Kidney function is stable.

## 2017-07-26 NOTE — Telephone Encounter (Signed)
Advised patient of lab results and medication changes Mailed follow up lab order to patient

## 2017-07-26 NOTE — Addendum Note (Signed)
Addended by: Alvina Filbert B on: 07/26/2017 01:50 PM   Modules accepted: Orders

## 2017-07-29 ENCOUNTER — Other Ambulatory Visit: Payer: Self-pay | Admitting: Cardiovascular Disease

## 2017-07-29 NOTE — Telephone Encounter (Signed)
Rx has been sent to the pharmacy electronically. ° °

## 2017-07-29 NOTE — Telephone Encounter (Signed)
Please review for refill. Thanks!  

## 2017-08-24 ENCOUNTER — Ambulatory Visit (INDEPENDENT_AMBULATORY_CARE_PROVIDER_SITE_OTHER): Payer: Medicare Other | Admitting: Internal Medicine

## 2017-08-24 ENCOUNTER — Encounter: Payer: Self-pay | Admitting: Internal Medicine

## 2017-08-24 VITALS — BP 136/84 | HR 90 | Temp 98.2°F | Resp 16 | Wt 215.0 lb

## 2017-08-24 DIAGNOSIS — E1140 Type 2 diabetes mellitus with diabetic neuropathy, unspecified: Secondary | ICD-10-CM | POA: Diagnosis not present

## 2017-08-24 DIAGNOSIS — K219 Gastro-esophageal reflux disease without esophagitis: Secondary | ICD-10-CM

## 2017-08-24 DIAGNOSIS — I69354 Hemiplegia and hemiparesis following cerebral infarction affecting left non-dominant side: Secondary | ICD-10-CM | POA: Diagnosis not present

## 2017-08-24 DIAGNOSIS — J22 Unspecified acute lower respiratory infection: Secondary | ICD-10-CM | POA: Diagnosis not present

## 2017-08-24 DIAGNOSIS — N189 Chronic kidney disease, unspecified: Secondary | ICD-10-CM

## 2017-08-24 DIAGNOSIS — I2510 Atherosclerotic heart disease of native coronary artery without angina pectoris: Secondary | ICD-10-CM

## 2017-08-24 DIAGNOSIS — E1165 Type 2 diabetes mellitus with hyperglycemia: Secondary | ICD-10-CM

## 2017-08-24 DIAGNOSIS — IMO0002 Reserved for concepts with insufficient information to code with codable children: Secondary | ICD-10-CM

## 2017-08-24 DIAGNOSIS — I10 Essential (primary) hypertension: Secondary | ICD-10-CM | POA: Diagnosis not present

## 2017-08-24 MED ORDER — DOXYCYCLINE HYCLATE 100 MG PO TABS: 100.0000 mg | ORAL_TABLET | Freq: Two times a day (BID) | ORAL | 0 refills | Status: DC

## 2017-08-24 MED ORDER — RANITIDINE HCL 150 MG PO TABS: 150.0000 mg | ORAL_TABLET | Freq: Two times a day (BID) | ORAL | 1 refills | Status: DC

## 2017-08-24 NOTE — Assessment & Plan Note (Signed)
Had recent CMP-kidney function is stable Does not take any NSAIDs Stressed drinking enough water during the day

## 2017-08-24 NOTE — Assessment & Plan Note (Signed)
Stressed regular exercise Blood pressure well-controlled Working on getting sugars better controlled Continue statin, Plavix, current blood pressure medications

## 2017-08-24 NOTE — Assessment & Plan Note (Signed)
BP well controlled Current regimen effective and well tolerated Continue current medications at current doses  

## 2017-08-24 NOTE — Assessment & Plan Note (Addendum)
Not controlled Continue nexium daily Start zantac 150 mg BID

## 2017-08-24 NOTE — Progress Notes (Signed)
Subjective:    Patient ID: [name], male    DOB: 1951-07-23, 66 y.o.   MRN: [id number]  HPI The patient is here for follow up.  He will be having tooth removed by his dentist soon and will likely need to stop his plavix. He understands there will be a slightly increased risk during this time of having another stroke.  He is developing boils under both armpits.  They were painful.  One has burst and is no longer painful.  He thinks they lasted for 2 weeks, but have gotten better. He has switched deodorants.  CAD, Hypertension: He is taking his medication daily. He is compliant with a low sodium diet.  He denies cardiac chest pain, palpitations, shortness of breath and regular headaches. He is not exercising regularly.      Hyperlipidemia: He is taking his medication daily. He is compliant with a low fat/cholesterol diet. He is not exercising regularly. He denies myalgias.   Diabetes: he sees Dr Loanne Drilling.  He is taking his medication daily as prescribed. He is compliant with a diabetic diet. He is not exercising regularly. He monitors his sugars and they have been running 70-130's. He is up-to-date with an ophthalmology examination.   H/o stroke with left sided hemiparesis: He is taking the Plavix daily as prescribed. He is also taking atorvastatin.  CKD:  He does take any nsaids.  He sometimes does not drink enough water during the day.    GERD:  He is taking his medication daily as prescribed.  He has GERD symptoms about every other day.    Cold symptoms:  Started on month ago. He states some mild sore throat, nasal congestion, sinus pressure, headaches, productive cough and occasional wheezing. His symptoms are not improving.  Medications and allergies reviewed with patient and updated if appropriate.  Patient Active Problem List   Diagnosis Date Noted  . CKD (chronic kidney disease) 02/22/2017  . Anemia 02/16/2017  . Chronic combined systolic and diastolic heart  failure ([place]) 06/29/2016  . LBBB (left bundle branch block) 02/02/2016  . Nonallopathic lesion of lumbosacral region 12/11/2014  . Nonallopathic lesion of sacral region 12/11/2014  . Nonallopathic lesion of thoracic region 12/11/2014  . Arthritis of left hip 11/20/2014  . Ischial bursitis of left side 10/28/2014  . Hamstring tightness of left lower extremity 09/16/2014  . Piriformis syndrome of left side 09/16/2014  . Dysphonia 06/22/2013  . Hemiparesis affecting left side as late effect of stroke ([place]) 10/10/2012  . Dysphagia following cerebrovascular accident 08/14/2012  . Chronic back pain   . Peripheral neuropathy ([place])   . TIA (transient ischemic attack) 04/08/2012  . Stroke ([place]) 02/21/2012  . [place], RIGHT 05/29/2010  . Type 2 diabetes, uncontrolled, with neuropathy ([place]) 04/13/2010  . ALLERGIC RHINITIS 04/13/2010  . GERD 04/13/2010  . ERECTILE DYSFUNCTION 03/12/2009  . HYPERTENSION, BENIGN 03/12/2009  . CAD, NATIVE VESSEL 03/12/2009    Current Outpatient Prescriptions on File Prior to Visit  Medication Sig Dispense Refill  . amLODipine (NORVASC) 5 MG tablet Take 2 tablets (10 mg total) by mouth daily. 90 tablet 3  . atorvastatin (LIPITOR) 40 MG tablet Take 1 tablet (40 mg total) by mouth daily. 30 tablet 5  . cetirizine (ZYRTEC) 10 MG tablet Take 10 mg by mouth as needed.    . chlorthalidone (HYGROTON) 25 MG tablet Take 0.5 tablets (12.5 mg total) by mouth daily. 90 tablet 1  . clopidogrel (PLAVIX) 75 MG tablet TAKE ONE TABLET  BY MOUTH ONCE DAILY 90 tablet 1  . clotrimazole-betamethasone (LOTRISONE) cream Apply 1 application topically 2 (two) times daily.    Marland Kitchen esomeprazole (NEXIUM) 40 MG capsule TAKE ONE CAPSULE BY MOUTH ONCE DAILY 90 capsule 3  . fluticasone (FLONASE) 50 MCG/ACT nasal spray Place 2 sprays into both nostrils daily. 16 g 0  . glucose blood (PRODIGY NO CODING BLOOD GLUC) test strip Use one strip to test blood sugar four times a day Dx E11.9 120 each  11  . Insulin NPH, Human,, Isophane, (HUMULIN N KWIKPEN) 100 UNIT/ML Kiwkpen Inject 30 Units into the skin every morning. And pen needles 1/day 15 mL 11  . Insulin Pen Needle 31G X 6 MM MISC Use to administer lantus insulin at bedtime 90 each 3  . losartan (COZAAR) 100 MG tablet Take 1 tablet (100 mg total) by mouth daily. 90 tablet 3  . metoprolol succinate (TOPROL-XL) 50 MG 24 hr tablet Take 1 tablet (50 mg total) by mouth daily. Take with or immediately following a meal. 90 tablet 3  . PRODIGY TWIST TOP LANCETS 28G MISC USE ONE LANCET TO CHECK GLUCOSE 4 TIMES DAILY Dx E11.9 200 each 5  . senna-docusate (SENOKOT-S) 8.6-50 MG per tablet Take 2 tablets by mouth 2 (two) times daily. For constipation.    . sodium chloride (OCEAN) 0.65 % SOLN nasal spray Place 1 spray into both nostrils as needed for congestion. 15 mL 0  . VENTOLIN HFA 108 (90 Base) MCG/ACT inhaler Inhale 1-2 puffs into the lungs every 6 (six) hours as needed for wheezing or shortness of breath. 1 Inhaler 11   No current facility-administered medications on file prior to visit.     Past Medical History:  Diagnosis Date  . ALLERGIC RHINITIS   . CAD, NATIVE VESSEL    BMS to OM1 2001, DES to BMS 2005  . Chronic back pain    herniated disc  . Chronic combined systolic and diastolic heart failure ([place]) 06/29/2016  . Constipation    takes Carafate four times day  . DIAB W/UNSPEC COMP TYPE II/UNSPEC TYPE UNCNTRL   . ERECTILE DYSFUNCTION   . GERD   . HYPERLIPIDEMIA-MIXED   . HYPERTENSION, BENIGN   . LBBB (left bundle branch block) 02/02/2016  . MORTON'S NEUROMA, RIGHT   . Peripheral neuropathy   . Seasonal allergies    takes Allegra and Benadryl daily prn;uses Flonase daily  . SHOULDER PAIN, RIGHT   . 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 TIA on medication 02/2012    Past Surgical History:  Procedure Laterality Date  . ANGIOPLASTY    . COLONOSCOPY    . CORONARY ANGIOPLASTY  2005   2 stents    . DENTAL SURGERY    . LARYNGOPLASTY  08/07/2012   Procedure: LARYNGOPLASTY;  Surgeon: Izora Gala, MD;  Location: [facility];  Service: ENT;  Laterality: Left;  Left Vocal Cord Medialyzation  . left knee surgury     x 2   . stent  2001, 2004   coronary stents    Social History   Social History  . Marital status: Legally Separated    Spouse name: N/A  . Number of children: 3  . Years of education: college   Occupational History  . disabled Disability   Social History Main Topics  . Smoking status: Former Smoker    Quit date: 02/24/1983  . Smokeless tobacco: Never Used  . Alcohol use No  .  Drug use: No  . Sexual activity: No   Other Topics Concern  . None   Social History Narrative   Married, lives in [place] with wife. He is disabled secondary to back pain. 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   Patient has 3 children.   Patient has college education.   Patient drinks 1 cup daily.    Family History  Problem Relation Age of Onset  . Cancer Mother     Review of Systems  Constitutional: Negative for chills and fever.  Respiratory: Positive for cough (bringing up some phlegm) and wheezing. Negative for shortness of breath.   Cardiovascular: Positive for chest pain (burning sensation occ) and leg swelling (left leg occ). Negative for palpitations.  Gastrointestinal: Positive for nausea (occ). Negative for abdominal pain.       Every other day GERD  Neurological: Positive for light-headedness and headaches (Occasional).       Objective:   Vitals:   08/24/17 0924  BP: 136/84  Pulse: 90  Resp: 16  Temp: 98.2 F (36.8 C)  SpO2: 98%   Wt Readings from Last 3 Encounters:  08/24/17 215 lb (97.5 kg)  07/20/17 214 lb 12.8 oz (97.4 kg)  07/18/17 216 lb (98 kg)   Body mass index is 29.99 kg/m.   Physical Exam    Constitutional: Appears well-developed and well-nourished. No distress.  HENT:  Head: Normocephalic and atraumatic.  Neck:  Neck supple. No tracheal deviation present. No thyromegaly present.  No cervical lymphadenopathy Cardiovascular: Normal rate, regular rhythm and normal heart sounds.   No murmur heard. No carotid bruit .  No edema Pulmonary/Chest: Effort normal and breath sounds normal. No respiratory distress. No has no wheezes. No rales.  Skin: Skin is warm and dry. Not diaphoretic.  Psychiatric: Normal mood and affect. Behavior is normal.      Assessment & Plan:    See Problem List for Assessment and Plan of chronic medical problems.

## 2017-08-24 NOTE — Assessment & Plan Note (Signed)
He has had some atypical chest pain, which is very suggestive of GERD since he is having GERD every other day We'll change her medications, but if chest pain persists he will need to see cardiology No other concerning symptoms Continue Plavix, statin

## 2017-08-24 NOTE — Assessment & Plan Note (Signed)
Persistent systems for one month Will start antibiotic-doxycycline, which he has taken before the past Call if no improvement

## 2017-08-24 NOTE — Patient Instructions (Addendum)
  All other Health Maintenance issues reviewed.   All recommended immunizations and age-appropriate screenings are up-to-date or discussed.  No immunizations administered today.   Medications reviewed and updated.  Changes include starting doxycycline for your cold symptoms and starting zantac twice daily for your heartburn.   Your prescription(s) have been submitted to your pharmacy. Please take as directed and contact our office if you believe you are having problem(s) with the medication(s).   Please followup in 6 months

## 2017-08-24 NOTE — Assessment & Plan Note (Signed)
Lab Results  Component Value Date   HGBA1C 8.3 07/18/2017    Sugar control improved Sugars are well controlled at home Following with Dr. Raiford Noble per him Stressed the importance of regular exercise

## 2017-09-06 ENCOUNTER — Other Ambulatory Visit: Payer: Self-pay | Admitting: Internal Medicine

## 2017-09-06 NOTE — Telephone Encounter (Signed)
Patient has called in regard.  Is currently out of medication.  Would like sent as soon as possible.

## 2017-09-23 ENCOUNTER — Ambulatory Visit (INDEPENDENT_AMBULATORY_CARE_PROVIDER_SITE_OTHER): Payer: Medicare Other | Admitting: Ophthalmology

## 2017-09-23 DIAGNOSIS — H353122 Nonexudative age-related macular degeneration, left eye, intermediate dry stage: Secondary | ICD-10-CM

## 2017-09-23 DIAGNOSIS — E113393 Type 2 diabetes mellitus with moderate nonproliferative diabetic retinopathy without macular edema, bilateral: Secondary | ICD-10-CM

## 2017-09-23 DIAGNOSIS — H35033 Hypertensive retinopathy, bilateral: Secondary | ICD-10-CM

## 2017-09-23 DIAGNOSIS — E11319 Type 2 diabetes mellitus with unspecified diabetic retinopathy without macular edema: Secondary | ICD-10-CM | POA: Diagnosis not present

## 2017-09-23 DIAGNOSIS — H2513 Age-related nuclear cataract, bilateral: Secondary | ICD-10-CM | POA: Diagnosis not present

## 2017-09-23 DIAGNOSIS — H43813 Vitreous degeneration, bilateral: Secondary | ICD-10-CM | POA: Diagnosis not present

## 2017-09-23 DIAGNOSIS — I10 Essential (primary) hypertension: Secondary | ICD-10-CM | POA: Diagnosis not present

## 2017-09-23 LAB — HM DIABETES EYE EXAM

## 2017-09-28 ENCOUNTER — Encounter: Payer: Self-pay | Admitting: Internal Medicine

## 2017-09-29 ENCOUNTER — Other Ambulatory Visit: Payer: Self-pay | Admitting: Internal Medicine

## 2017-10-05 ENCOUNTER — Other Ambulatory Visit: Payer: Self-pay | Admitting: Emergency Medicine

## 2017-10-05 MED ORDER — PRODIGY TWIST TOP LANCETS 28G MISC: 1 refills | Status: DC

## 2017-10-18 ENCOUNTER — Ambulatory Visit: Payer: Medicare Other | Admitting: Endocrinology

## 2017-10-19 ENCOUNTER — Ambulatory Visit (INDEPENDENT_AMBULATORY_CARE_PROVIDER_SITE_OTHER): Payer: Medicare Other | Admitting: Endocrinology

## 2017-10-19 ENCOUNTER — Encounter: Payer: Self-pay | Admitting: Endocrinology

## 2017-10-19 VITALS — BP 134/72 | HR 88 | Wt 219.2 lb

## 2017-10-19 DIAGNOSIS — E1140 Type 2 diabetes mellitus with diabetic neuropathy, unspecified: Secondary | ICD-10-CM | POA: Diagnosis not present

## 2017-10-19 DIAGNOSIS — IMO0002 Reserved for concepts with insufficient information to code with codable children: Secondary | ICD-10-CM

## 2017-10-19 DIAGNOSIS — I2510 Atherosclerotic heart disease of native coronary artery without angina pectoris: Secondary | ICD-10-CM | POA: Diagnosis not present

## 2017-10-19 DIAGNOSIS — E1165 Type 2 diabetes mellitus with hyperglycemia: Secondary | ICD-10-CM

## 2017-10-19 LAB — POCT GLYCOSYLATED HEMOGLOBIN (HGB A1C): Hemoglobin A1C: 9.3

## 2017-10-19 MED ORDER — INSULIN NPH ISOPHANE & REGULAR (70-30) 100 UNIT/ML ~~LOC~~ SUSP: 30.0000 [IU] | Freq: Every day | SUBCUTANEOUS | 11 refills | Status: DC

## 2017-10-19 NOTE — Patient Instructions (Addendum)
Please change the lantus to 70/30, 30 units each morning with breakfast. On this type of insulin schedule, you should eat meals on a regular schedule (especially breakfast and lunch).  If a meal is missed or significantly delayed, your blood sugar could go low.   Please come back for a follow-up appointment in 2 months.  check your blood sugar twice a day.  vary the time of day when you check, between before the 3 meals, and at bedtime.  also check if you have symptoms of your blood sugar being too high or too low.  please keep a record of the readings and bring it to your next appointment here (or you can bring the meter itself).  You can write it on any piece of paper.  please call us sooner if your blood sugar goes below 70, or if you have a lot of readings over 200.

## 2017-10-19 NOTE — Progress Notes (Signed)
Subjective:    Patient ID: [name], male    DOB: [birth year], 66 y.o.   MRN: [id number]  HPI Pt returns for f/u of diabetes mellitus: DM type: Insulin-requiring type 2 Dx'ed: 5784 Complications: polyneuropathy, CAD, renal insufficiency, retinopathy, and TIA.   Therapy: insulin since 2011 DKA: never.  Severe hypoglycemia: never.  Pancreatitis: never.  Other: He says he cannot use syringe and vial, due to tremor; he changed lantus to NPH, due to pattern on cbg's; he declines multiple daily injections.   Interval history: Pt says he misses the insulin approx once per week. no cbg record, but states cbg's vary from 79-177.  It is in general higher as the day goes on Past Medical History:  Diagnosis Date  . ALLERGIC RHINITIS   . CAD, NATIVE VESSEL    BMS to OM1 2001, DES to BMS 2005  . Chronic back pain    herniated disc  . Chronic combined systolic and diastolic heart failure ([place]) 06/29/2016  . Constipation    takes Carafate four times day  . DIAB W/UNSPEC COMP TYPE II/UNSPEC TYPE UNCNTRL   . ERECTILE DYSFUNCTION   . GERD   . HYPERLIPIDEMIA-MIXED   . HYPERTENSION, BENIGN   . LBBB (left bundle branch block) 02/02/2016  . MORTON'S NEUROMA, RIGHT   . Peripheral neuropathy   . Seasonal allergies    takes Allegra and Benadryl daily prn;uses Flonase daily  . SHOULDER PAIN, RIGHT   . 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 TIA on medication 02/2012    Past Surgical History:  Procedure Laterality Date  . ANGIOPLASTY    . COLONOSCOPY    . CORONARY ANGIOPLASTY  2005   2 stents  . DENTAL SURGERY    . LARYNGOPLASTY  08/07/2012   Procedure: LARYNGOPLASTY;  Surgeon: Izora Gala, MD;  Location: [facility];  Service: ENT;  Laterality: Left;  Left Vocal Cord Medialyzation  . left knee surgury     x 2   . stent  2001, 2004   coronary stents    Social History   Socioeconomic History  . Marital status: Legally Separated    Spouse name: Not on file  .  Number of children: 3  . Years of education: college  . Highest education level: Not on file  Social Needs  . Financial resource strain: Not on file  . Food insecurity - worry: Not on file  . Food insecurity - inability: Not on file  . Transportation needs - medical: Not on file  . Transportation needs - non-medical: Not on file  Occupational History  . Occupation: disabled    Employer: [employer]  Tobacco Use  . Smoking status: Former Smoker    Last attempt to quit: 02/24/1983    Years since quitting: 34.6  . Smokeless tobacco: Never Used  Substance and Sexual Activity  . Alcohol use: No    Alcohol/week: 0.0 oz  . Drug use: No  . Sexual activity: No  Other Topics Concern  . Not on file  Social History Narrative   Married, lives in [place] with wife. He is disabled secondary to back pain. 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   Patient has 3 children.   Patient has college education.   Patient drinks 1 cup daily.    Current Outpatient Medications on File Prior to Visit  Medication Sig Dispense Refill  . amLODipine (NORVASC) 5 MG tablet Take  2 tablets (10 mg total) by mouth daily. 90 tablet 3  . atorvastatin (LIPITOR) 40 MG tablet Take 1 tablet (40 mg total) by mouth daily. 30 tablet 5  . cetirizine (ZYRTEC) 10 MG tablet Take 10 mg by mouth as needed.    . chlorthalidone (HYGROTON) 25 MG tablet Take 0.5 tablets (12.5 mg total) by mouth daily. 90 tablet 1  . clopidogrel (PLAVIX) 75 MG tablet TAKE 1 TABLET BY MOUTH ONCE DAILY 90 tablet 1  . clotrimazole-betamethasone (LOTRISONE) cream Apply 1 application topically 2 (two) times daily.    Marland Kitchen doxycycline (VIBRA-TABS) 100 MG tablet Take 1 tablet (100 mg total) by mouth 2 (two) times daily. 20 tablet 0  . esomeprazole (NEXIUM) 40 MG capsule TAKE ONE CAPSULE BY MOUTH ONCE DAILY 90 capsule 3  . fluticasone (FLONASE) 50 MCG/ACT nasal spray Place 2 sprays into both nostrils daily. 16 g 0  . glucose blood  (PRODIGY NO CODING BLOOD GLUC) test strip USE ONE STRIP TO CHECK GLUCOSE 4 TIMES DAILY 400 each 14  . Insulin Pen Needle 31G X 6 MM MISC Use to administer lantus insulin at bedtime 90 each 3  . losartan (COZAAR) 100 MG tablet Take 1 tablet (100 mg total) by mouth daily. 90 tablet 3  . metoprolol succinate (TOPROL-XL) 50 MG 24 hr tablet Take 1 tablet (50 mg total) by mouth daily. Take with or immediately following a meal. 90 tablet 3  . PRODIGY TWIST TOP LANCETS 28G MISC USE   TO CHECK GLUCOSE 4 TIMES DAILY 400 each 1  . ranitidine (ZANTAC) 150 MG tablet Take 1 tablet (150 mg total) by mouth 2 (two) times daily. 180 tablet 1  . senna-docusate (SENOKOT-S) 8.6-50 MG per tablet Take 2 tablets by mouth 2 (two) times daily. For constipation.    . sodium chloride (OCEAN) 0.65 % SOLN nasal spray Place 1 spray into both nostrils as needed for congestion. 15 mL 0  . VENTOLIN HFA 108 (90 Base) MCG/ACT inhaler Inhale 1-2 puffs into the lungs every 6 (six) hours as needed for wheezing or shortness of breath. 1 Inhaler 11   No current facility-administered medications on file prior to visit.     Allergies  Allergen Reactions  . Penicillins Anaphylaxis  . Pneumococcal Vaccines Anaphylaxis  . Shellfish Allergy Anaphylaxis  . Influenza Vaccines   . Lisinopril     cough  . Topiramate Other (See Comments)    Chest spasms and numbness    Family History  Problem Relation Age of Onset  . Cancer Mother     BP 134/72 (BP Location: Left Arm, Patient Position: Sitting, Cuff Size: Normal)   Pulse 88   Wt 219 lb 3.2 oz (99.4 kg)   SpO2 99%   BMI 30.57 kg/m   Review of Systems He denies hypoglycemia    Objective:   Physical Exam VITAL SIGNS:  See vs page GENERAL: no distress Pulses: foot pulses are intact bilaterally.   MSK: no deformity of the feet or ankles.  CV: no edema of the legs or ankles Skin:  no ulcer on the feet or ankles.  normal color and temp on the feet and ankles Neuro: sensation  is intact to touch on the feet and ankles, but decreased from normal.     Lab Results  Component Value Date   HGBA1C 9.3 10/19/2017   Lab Results  Component Value Date   CREATININE 1.57 (H) 07/20/2017   BUN 24 07/20/2017   NA 142 07/20/2017  K 4.6 07/20/2017   CL 102 07/20/2017   CO2 28 07/20/2017       Assessment & Plan:  Insulin-requiring type 2 DM, with CAD: worse.  Renal insuff: in this setting, he does not need PM insulin.    Patient Instructions  Please change the lantus to 70/30, 30 units each morning with breakfast. On this type of insulin schedule, you should eat meals on a regular schedule (especially breakfast and lunch).  If a meal is missed or significantly delayed, your blood sugar could go low.   Please come back for a follow-up appointment in 2 months.  check your blood sugar twice a day.  vary the time of day when you check, between before the 3 meals, and at bedtime.  also check if you have symptoms of your blood sugar being too high or too low.  please keep a record of the readings and bring it to your next appointment here (or you can bring the meter itself).  You can write it on any piece of paper.  please call us sooner if your blood sugar goes below 70, or if you have a lot of readings over 200.

## 2017-10-21 ENCOUNTER — Encounter (INDEPENDENT_AMBULATORY_CARE_PROVIDER_SITE_OTHER): Payer: Medicare Other | Admitting: Ophthalmology

## 2017-10-25 ENCOUNTER — Other Ambulatory Visit: Payer: Self-pay | Admitting: Internal Medicine

## 2017-10-25 ENCOUNTER — Telehealth: Payer: Self-pay | Admitting: *Deleted

## 2017-10-25 ENCOUNTER — Other Ambulatory Visit: Payer: Self-pay

## 2017-10-25 MED ORDER — INSULIN PEN NEEDLE 31G X 6 MM MISC: 3 refills | Status: DC

## 2017-10-25 MED ORDER — INSULIN ISOPHANE & REGULAR (HUMAN 70-30)100 UNIT/ML KWIKPEN: PEN_INJECTOR | SUBCUTANEOUS | 11 refills | Status: DC

## 2017-10-25 NOTE — Telephone Encounter (Signed)
Patient seen Dr. Loanne Drilling on 10/19/17 he prescribed Humalin 70/30 and he received vials patient states he can't use the vials.  He needs a RX for the insulin pens. Patient pharmacy is  [facility] on [address] . Please advise. Thank you

## 2017-10-25 NOTE — Telephone Encounter (Signed)
I have sent in prescription to pharmacy as well as more pen needles.

## 2017-10-31 ENCOUNTER — Encounter (INDEPENDENT_AMBULATORY_CARE_PROVIDER_SITE_OTHER): Payer: Medicare Other | Admitting: Ophthalmology

## 2017-11-04 ENCOUNTER — Encounter (INDEPENDENT_AMBULATORY_CARE_PROVIDER_SITE_OTHER): Payer: Medicare Other | Admitting: Ophthalmology

## 2017-11-04 DIAGNOSIS — E113393 Type 2 diabetes mellitus with moderate nonproliferative diabetic retinopathy without macular edema, bilateral: Secondary | ICD-10-CM | POA: Diagnosis not present

## 2017-11-04 DIAGNOSIS — E11319 Type 2 diabetes mellitus with unspecified diabetic retinopathy without macular edema: Secondary | ICD-10-CM

## 2017-12-20 ENCOUNTER — Ambulatory Visit (INDEPENDENT_AMBULATORY_CARE_PROVIDER_SITE_OTHER): Payer: Medicare Other | Admitting: Endocrinology

## 2017-12-20 ENCOUNTER — Encounter: Payer: Self-pay | Admitting: Endocrinology

## 2017-12-20 VITALS — BP 138/80 | HR 79 | Wt 212.0 lb

## 2017-12-20 DIAGNOSIS — IMO0002 Reserved for concepts with insufficient information to code with codable children: Secondary | ICD-10-CM

## 2017-12-20 DIAGNOSIS — E1140 Type 2 diabetes mellitus with diabetic neuropathy, unspecified: Secondary | ICD-10-CM | POA: Diagnosis not present

## 2017-12-20 DIAGNOSIS — E1165 Type 2 diabetes mellitus with hyperglycemia: Secondary | ICD-10-CM | POA: Diagnosis not present

## 2017-12-20 LAB — POCT GLYCOSYLATED HEMOGLOBIN (HGB A1C): Hemoglobin A1C: 9.4

## 2017-12-20 MED ORDER — INSULIN ISOPHANE & REGULAR (HUMAN 70-30)100 UNIT/ML KWIKPEN: 35.0000 [IU] | PEN_INJECTOR | Freq: Every day | SUBCUTANEOUS | 11 refills | Status: DC

## 2017-12-20 NOTE — Progress Notes (Signed)
Subjective:    Patient ID: [name], male    DOB: 04/09/1951, 67 y.o.   MRN: [id number]  HPI Pt returns for f/u of diabetes mellitus: DM type: Insulin-requiring type 2 Dx'ed: 7371 Complications: polyneuropathy, CAD, renal insufficiency, retinopathy, and TIA.   Therapy: insulin since 2011 DKA: never.  Severe hypoglycemia: never.  Pancreatitis: never.  Other: He says he cannot use syringe and vial, due to tremor; he changed lantus to NPH, then 70/30, due to pattern on cbg's; he declines multiple daily injections.   Interval history: Pt says he never misses the insulin.  no cbg record, but states cbg's vary from 54-200.  He seldom has hypoglycemia, and these episodes are mild.  This happens when a meal is missed.  Past Medical History:  Diagnosis Date  . ALLERGIC RHINITIS   . CAD, NATIVE VESSEL    BMS to OM1 2001, DES to BMS 2005  . Chronic back pain    herniated disc  . Chronic combined systolic and diastolic heart failure ([place]) 06/29/2016  . Constipation    takes Carafate four times day  . DIAB W/UNSPEC COMP TYPE II/UNSPEC TYPE UNCNTRL   . ERECTILE DYSFUNCTION   . GERD   . HYPERLIPIDEMIA-MIXED   . HYPERTENSION, BENIGN   . LBBB (left bundle branch block) 02/02/2016  . MORTON'S NEUROMA, RIGHT   . Peripheral neuropathy   . Seasonal allergies    takes Allegra and Benadryl daily prn;uses Flonase daily  . SHOULDER PAIN, RIGHT   . 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 TIA on medication 02/2012    Past Surgical History:  Procedure Laterality Date  . ANGIOPLASTY    . COLONOSCOPY    . CORONARY ANGIOPLASTY  2005   2 stents  . DENTAL SURGERY    . LARYNGOPLASTY  08/07/2012   Procedure: LARYNGOPLASTY;  Surgeon: Izora Gala, MD;  Location: [facility];  Service: ENT;  Laterality: Left;  Left Vocal Cord Medialyzation  . left knee surgury     x 2   . stent  2001, 2004   coronary stents    Social History   Socioeconomic History  . Marital status:  Legally Separated    Spouse name: Not on file  . Number of children: 3  . Years of education: college  . Highest education level: Not on file  Social Needs  . Financial resource strain: Not on file  . Food insecurity - worry: Not on file  . Food insecurity - inability: Not on file  . Transportation needs - medical: Not on file  . Transportation needs - non-medical: Not on file  Occupational History  . Occupation: disabled    Employer: [employer]  Tobacco Use  . Smoking status: Former Smoker    Last attempt to quit: 02/24/1983    Years since quitting: 34.8  . Smokeless tobacco: Never Used  Substance and Sexual Activity  . Alcohol use: No    Alcohol/week: 0.0 oz  . Drug use: No  . Sexual activity: No  Other Topics Concern  . Not on file  Social History Narrative   Married, lives in [place] with wife. He is disabled secondary to back pain. 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   Patient has 3 children.   Patient has college education.   Patient drinks 1 cup daily.    Current Outpatient Medications on File Prior to Visit  Medication Sig Dispense Refill  .  amLODipine (NORVASC) 5 MG tablet TAKE 2 TABLETS BY MOUTH ONCE DAILY 180 tablet 1  . atorvastatin (LIPITOR) 40 MG tablet Take 1 tablet (40 mg total) by mouth daily. 30 tablet 5  . cetirizine (ZYRTEC) 10 MG tablet Take 10 mg by mouth as needed.    . chlorthalidone (HYGROTON) 25 MG tablet Take 0.5 tablets (12.5 mg total) by mouth daily. 90 tablet 1  . clopidogrel (PLAVIX) 75 MG tablet TAKE 1 TABLET BY MOUTH ONCE DAILY 90 tablet 1  . clotrimazole-betamethasone (LOTRISONE) cream Apply 1 application topically 2 (two) times daily.    Marland Kitchen doxycycline (VIBRA-TABS) 100 MG tablet Take 1 tablet (100 mg total) by mouth 2 (two) times daily. 20 tablet 0  . esomeprazole (NEXIUM) 40 MG capsule TAKE ONE CAPSULE BY MOUTH ONCE DAILY 90 capsule 3  . fluticasone (FLONASE) 50 MCG/ACT nasal spray Place 2 sprays into both  nostrils daily. 16 g 0  . glucose blood (PRODIGY NO CODING BLOOD GLUC) test strip USE ONE STRIP TO CHECK GLUCOSE 4 TIMES DAILY 400 each 14  . Insulin Pen Needle 31G X 6 MM MISC Use to administer lantus insulin at bedtime 90 each 3  . losartan (COZAAR) 100 MG tablet Take 1 tablet (100 mg total) by mouth daily. 90 tablet 3  . metoprolol succinate (TOPROL-XL) 50 MG 24 hr tablet Take 1 tablet (50 mg total) by mouth daily. Take with or immediately following a meal. 90 tablet 3  . PRODIGY TWIST TOP LANCETS 28G MISC USE   TO CHECK GLUCOSE 4 TIMES DAILY 400 each 1  . ranitidine (ZANTAC) 150 MG tablet Take 1 tablet (150 mg total) by mouth 2 (two) times daily. 180 tablet 1  . senna-docusate (SENOKOT-S) 8.6-50 MG per tablet Take 2 tablets by mouth 2 (two) times daily. For constipation.    . sodium chloride (OCEAN) 0.65 % SOLN nasal spray Place 1 spray into both nostrils as needed for congestion. 15 mL 0  . VENTOLIN HFA 108 (90 Base) MCG/ACT inhaler Inhale 1-2 puffs into the lungs every 6 (six) hours as needed for wheezing or shortness of breath. 1 Inhaler 11   No current facility-administered medications on file prior to visit.     Allergies  Allergen Reactions  . Penicillins Anaphylaxis  . Pneumococcal Vaccines Anaphylaxis  . Shellfish Allergy Anaphylaxis  . Influenza Vaccines   . Lisinopril     cough  . Topiramate Other (See Comments)    Chest spasms and numbness    Family History  Problem Relation Age of Onset  . Cancer Mother     BP 138/80 (BP Location: Left Arm, Patient Position: Sitting, Cuff Size: Normal)   Pulse 79   Wt 212 lb (96.2 kg)   SpO2 98%   BMI 29.57 kg/m    Review of Systems He denies LOC.     Objective:   Physical Exam VITAL SIGNS:  See vs page GENERAL: no distress Pulses: foot pulses are intact bilaterally.   MSK: no deformity of the feet or ankles.  CV: no edema of the legs or ankles Skin:  no ulcer on the feet or ankles.  normal color and temp on the feet  and ankles Neuro: sensation is intact to touch on the feet and ankles, but decreased from normal.    Lab Results  Component Value Date   HGBA1C 9.4 12/20/2017       Assessment & Plan:  Insulin-requiring type 2 DM, with CAD: worse. Renal insuff: he needs 70/30 insulin  only qam.  Patient Instructions  Please increase the 70/30 to 35 units each morning with breakfast. On this type of insulin schedule, you should eat meals on a regular schedule (especially breakfast and lunch).  If a meal is missed or significantly delayed, your blood sugar could go low.   Please come back for a follow-up appointment in 2 months.  check your blood sugar twice a day.  vary the time of day when you check, between before the 3 meals, and at bedtime.  also check if you have symptoms of your blood sugar being too high or too low.  please keep a record of the readings and bring it to your next appointment here (or you can bring the meter itself).  You can write it on any piece of paper.  please call us sooner if your blood sugar goes below 70, or if you have a lot of readings over 200.

## 2017-12-20 NOTE — Patient Instructions (Addendum)
Please increase the 70/30 to 35 units each morning with breakfast. On this type of insulin schedule, you should eat meals on a regular schedule (especially breakfast and lunch).  If a meal is missed or significantly delayed, your blood sugar could go low.   Please come back for a follow-up appointment in 2 months.  check your blood sugar twice a day.  vary the time of day when you check, between before the 3 meals, and at bedtime.  also check if you have symptoms of your blood sugar being too high or too low.  please keep a record of the readings and bring it to your next appointment here (or you can bring the meter itself).  You can write it on any piece of paper.  please call us sooner if your blood sugar goes below 70, or if you have a lot of readings over 200.

## 2018-01-21 ENCOUNTER — Other Ambulatory Visit: Payer: Self-pay | Admitting: Cardiovascular Disease

## 2018-01-23 NOTE — Telephone Encounter (Signed)
Refill Request.  

## 2018-01-23 NOTE — Telephone Encounter (Signed)
Lipitor isn't a NOAC. Sent to wrong refill pool.

## 2018-01-23 NOTE — Telephone Encounter (Signed)
Review for refill. 

## 2018-02-13 ENCOUNTER — Other Ambulatory Visit: Payer: Self-pay | Admitting: Internal Medicine

## 2018-02-15 ENCOUNTER — Ambulatory Visit (INDEPENDENT_AMBULATORY_CARE_PROVIDER_SITE_OTHER): Payer: Medicare Other | Admitting: Endocrinology

## 2018-02-15 ENCOUNTER — Encounter: Payer: Self-pay | Admitting: Endocrinology

## 2018-02-15 VITALS — BP 150/80 | HR 72 | Wt 215.2 lb

## 2018-02-15 DIAGNOSIS — E1165 Type 2 diabetes mellitus with hyperglycemia: Secondary | ICD-10-CM

## 2018-02-15 DIAGNOSIS — E1140 Type 2 diabetes mellitus with diabetic neuropathy, unspecified: Secondary | ICD-10-CM

## 2018-02-15 DIAGNOSIS — IMO0002 Reserved for concepts with insufficient information to code with codable children: Secondary | ICD-10-CM

## 2018-02-15 LAB — POCT GLYCOSYLATED HEMOGLOBIN (HGB A1C): Hemoglobin A1C: 9.2

## 2018-02-15 MED ORDER — INSULIN LISPRO PROT & LISPRO (50-50 MIX) 100 UNIT/ML KWIKPEN: 35.0000 [IU] | PEN_INJECTOR | Freq: Every day | SUBCUTANEOUS | 11 refills | Status: DC

## 2018-02-15 NOTE — Patient Instructions (Addendum)
Please change the 70/30 to "50/50," 35 units each morning with breakfast.  We'll do the prior authorization if necessary. On this type of insulin schedule, you should eat meals on a regular schedule (especially breakfast and lunch).  If a meal is missed or significantly delayed, your blood sugar could go low.   Please come back for a follow-up appointment in 2 months.  check your blood sugar twice a day.  vary the time of day when you check, between before the 3 meals, and at bedtime.  also check if you have symptoms of your blood sugar being too high or too low.  please keep a record of the readings and bring it to your next appointment here (or you can bring the meter itself).  You can write it on any piece of paper.  please call us sooner if your blood sugar goes below 70, or if you have a lot of readings over 200.

## 2018-02-15 NOTE — Progress Notes (Signed)
Subjective:    Patient ID: [name], male    DOB: 09-05-1951, 67 y.o.   MRN: [id number]  HPI Pt returns for f/u of diabetes mellitus: DM type: Insulin-requiring type 2 Dx'ed: 8850 Complications: polyneuropathy, CAD, renal insufficiency, retinopathy, and TIA.   Therapy: insulin since 2011 DKA: never.  Severe hypoglycemia: never.  Pancreatitis: never.  Other: He says he cannot use syringe and vial, due to tremor; he changed lantus to NPH, then 70/30, due to pattern on cbg's; he declines multiple daily injections.   Interval history: Pt says he misses the insulin approx twice per month.  no cbg record, but states cbg's are well-controlled.  He seldom has hypoglycemia (60), and these episodes are mild.  This usually happens fasting.  Past Medical History:  Diagnosis Date  . ALLERGIC RHINITIS   . CAD, NATIVE VESSEL    BMS to OM1 2001, DES to BMS 2005  . Chronic back pain    herniated disc  . Chronic combined systolic and diastolic heart failure ([place]) 06/29/2016  . Constipation    takes Carafate four times day  . DIAB W/UNSPEC COMP TYPE II/UNSPEC TYPE UNCNTRL   . ERECTILE DYSFUNCTION   . GERD   . HYPERLIPIDEMIA-MIXED   . HYPERTENSION, BENIGN   . LBBB (left bundle branch block) 02/02/2016  . MORTON'S NEUROMA, RIGHT   . Peripheral neuropathy   . Seasonal allergies    takes Allegra and Benadryl daily prn;uses Flonase daily  . SHOULDER PAIN, RIGHT   . 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 TIA on medication 02/2012    Past Surgical History:  Procedure Laterality Date  . ANGIOPLASTY    . COLONOSCOPY    . CORONARY ANGIOPLASTY  2005   2 stents  . DENTAL SURGERY    . LARYNGOPLASTY  08/07/2012   Procedure: LARYNGOPLASTY;  Surgeon: Izora Gala, MD;  Location: [facility];  Service: ENT;  Laterality: Left;  Left Vocal Cord Medialyzation  . left knee surgury     x 2   . stent  2001, 2004   coronary stents    Social History   Socioeconomic History    . Marital status: Legally Separated    Spouse name: Not on file  . Number of children: 3  . Years of education: college  . Highest education level: Not on file  Occupational History  . Occupation: disabled    Employer: [employer]  Social Needs  . Financial resource strain: Not on file  . Food insecurity:    Worry: Not on file    Inability: Not on file  . Transportation needs:    Medical: Not on file    Non-medical: Not on file  Tobacco Use  . Smoking status: Former Smoker    Last attempt to quit: 02/24/1983    Years since quitting: 35.0  . Smokeless tobacco: Never Used  Substance and Sexual Activity  . Alcohol use: No    Alcohol/week: 0.0 oz  . Drug use: No  . Sexual activity: Never  Lifestyle  . Physical activity:    Days per week: Not on file    Minutes per session: Not on file  . Stress: Not on file  Relationships  . Social connections:    Talks on phone: Not on file    Gets together: Not on file    Attends religious service: Not on file    Active member of club or organization: Not on file  Attends meetings of clubs or organizations: Not on file    Relationship status: Not on file  . Intimate partner violence:    Fear of current or ex partner: Not on file    Emotionally abused: Not on file    Physically abused: Not on file    Forced sexual activity: Not on file  Other Topics Concern  . Not on file  Social History Narrative   Married, lives in [place] with wife. He is disabled secondary to back pain. 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   Patient has 3 children.   Patient has college education.   Patient drinks 1 cup daily.    Current Outpatient Medications on File Prior to Visit  Medication Sig Dispense Refill  . amLODipine (NORVASC) 5 MG tablet TAKE 2 TABLETS BY MOUTH ONCE DAILY 180 tablet 1  . atorvastatin (LIPITOR) 40 MG tablet TAKE 1 TABLET BY MOUTH ONCE DAILY 30 tablet 5  . cetirizine (ZYRTEC) 10 MG tablet Take 10 mg by  mouth as needed.    . chlorthalidone (HYGROTON) 25 MG tablet Take 0.5 tablets (12.5 mg total) by mouth daily. 90 tablet 1  . clopidogrel (PLAVIX) 75 MG tablet TAKE 1 TABLET BY MOUTH ONCE DAILY 90 tablet 1  . clotrimazole-betamethasone (LOTRISONE) cream Apply 1 application topically 2 (two) times daily.    Marland Kitchen esomeprazole (NEXIUM) 40 MG capsule TAKE ONE CAPSULE BY MOUTH ONCE DAILY 90 capsule 3  . fluticasone (FLONASE) 50 MCG/ACT nasal spray Place 2 sprays into both nostrils daily. 16 g 0  . glucose blood (PRODIGY NO CODING BLOOD GLUC) test strip USE ONE STRIP TO CHECK GLUCOSE 4 TIMES DAILY 400 each 14  . Insulin Pen Needle 31G X 6 MM MISC Use to administer lantus insulin at bedtime 90 each 3  . losartan (COZAAR) 100 MG tablet Take 1 tablet (100 mg total) by mouth daily. 90 tablet 3  . metoprolol succinate (TOPROL-XL) 50 MG 24 hr tablet TAKE 1 TABLET BY MOUTH ONCE DAILY .  TAKE  WITH  OR  IMMEDIATELLY  FOLLOWING  A  MEAL 90 tablet 0  . PRODIGY TWIST TOP LANCETS 28G MISC USE   TO CHECK GLUCOSE 4 TIMES DAILY 400 each 1  . ranitidine (ZANTAC) 150 MG tablet Take 1 tablet (150 mg total) by mouth 2 (two) times daily. 180 tablet 1  . senna-docusate (SENOKOT-S) 8.6-50 MG per tablet Take 2 tablets by mouth 2 (two) times daily. For constipation.    . sodium chloride (OCEAN) 0.65 % SOLN nasal spray Place 1 spray into both nostrils as needed for congestion. 15 mL 0  . VENTOLIN HFA 108 (90 Base) MCG/ACT inhaler Inhale 1-2 puffs into the lungs every 6 (six) hours as needed for wheezing or shortness of breath. 1 Inhaler 11   No current facility-administered medications on file prior to visit.     Allergies  Allergen Reactions  . Penicillins Anaphylaxis  . Pneumococcal Vaccines Anaphylaxis  . Shellfish Allergy Anaphylaxis  . Influenza Vaccines   . Lisinopril     cough  . Topiramate Other (See Comments)    Chest spasms and numbness    Family History  Problem Relation Age of Onset  . Cancer Mother      BP (!) 150/80 (BP Location: Left Arm, Patient Position: Sitting, Cuff Size: Normal)   Pulse 72   Wt 215 lb 3.2 oz (97.6 kg)   SpO2 99%   BMI 30.01 kg/m  Review of Systems Denies LOC    Objective:   Physical Exam VITAL SIGNS:  See vs page GENERAL: no distress Pulses: foot pulses are intact bilaterally.   MSK: no deformity of the feet or ankles.  CV: no edema of the legs or ankles Skin:  no ulcer on the feet or ankles.  normal color and temp on the feet and ankles Neuro: sensation is intact to touch on the feet and ankles, but decreased from normal.     A1c=9.2%     Assessment & Plan:  Insulin-requiring type 2 DM, with CAD: worse Renal failure: in this context, he needs a faster-acting qd insulin Tremor: he needs pen rather than syringe and vial.  Patient Instructions  Please change the 70/30 to "50/50," 35 units each morning with breakfast.  We'll do the prior authorization if necessary. On this type of insulin schedule, you should eat meals on a regular schedule (especially breakfast and lunch).  If a meal is missed or significantly delayed, your blood sugar could go low.   Please come back for a follow-up appointment in 2 months.  check your blood sugar twice a day.  vary the time of day when you check, between before the 3 meals, and at bedtime.  also check if you have symptoms of your blood sugar being too high or too low.  please keep a record of the readings and bring it to your next appointment here (or you can bring the meter itself).  You can write it on any piece of paper.  please call us sooner if your blood sugar goes below 70, or if you have a lot of readings over 200.

## 2018-02-21 ENCOUNTER — Ambulatory Visit: Payer: Medicare Other | Admitting: Internal Medicine

## 2018-02-22 ENCOUNTER — Ambulatory Visit: Payer: Medicare Other | Admitting: Internal Medicine

## 2018-02-23 NOTE — Progress Notes (Signed)
Subjective:    Patient ID: [name], male    DOB: 1951-11-07, 67 y.o.   MRN: [id number]  HPI The patient is here for follow up.  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 He is compliant with a low sodium diet.  He had an episode of chest pain this morning - chewing an aspirin took it away.  He denies palpitations, shortness of breath and regular headaches. He is not exercising regularly.  He does not monitor his blood pressure at home.    Diabetes: He is following with Dr. Loanne Drilling.  He is taking his medication daily as prescribed. He is not compliant with a diabetic diet. He is not exercising regularly. He monitors his sugars and they have been running 80's - 230. He is up-to-date with an ophthalmology examination.   GERD:  He is taking his medication daily as prescribed.  He denies any GERD symptoms and feels his GERD is well controlled.   Hyperlipidemia: He is taking his medication daily. He is compliant with a low fat/cholesterol diet. He is not exercising regularly. He denies myalgias.   Chronic kidney disease:  He drinks a lot of fluids - some water, Gatorade, sweet tea.  He does not take any nsaids.     Head pain:  For the past week he has been having head pain in his temples.  He can have on both sides of just one.  It will go away on its own.  Can last 5 min - hours.  He feels sleepy with it.  No pattern to the headaches - the pain occurs randomly.  He denies any associated symptoms.  He does have some intermittent chronic blurry vision, but this is not new.  He thinks it may be associated with his elevated sugars.  He does have some right-sided jaw pain, but that is also not new.  He thinks that is related to 1 of his teeth and plans on following up with his dentist.  Medications and allergies reviewed with patient and updated if appropriate.  Patient Active Problem List   Diagnosis Date Noted  . CKD (chronic  kidney disease) 02/22/2017  . Anemia 02/16/2017  . Chronic combined systolic and diastolic heart failure ([place]) 06/29/2016  . LBBB (left bundle branch block) 02/02/2016  . Nonallopathic lesion of lumbosacral region 12/11/2014  . Nonallopathic lesion of sacral region 12/11/2014  . Nonallopathic lesion of thoracic region 12/11/2014  . Arthritis of left hip 11/20/2014  . Ischial bursitis of left side 10/28/2014  . Hamstring tightness of left lower extremity 09/16/2014  . Piriformis syndrome of left side 09/16/2014  . Dysphonia 06/22/2013  . Hemiparesis affecting left side as late effect of stroke ([place]) 10/10/2012  . Dysphagia following cerebrovascular accident 08/14/2012  . Chronic back pain   . Peripheral neuropathy ([place])   . TIA (transient ischemic attack) 04/08/2012  . Stroke ([place]) 02/21/2012  . Valeria, RIGHT 05/29/2010  . Type 2 diabetes, uncontrolled, with neuropathy ([place]) 04/13/2010  . ALLERGIC RHINITIS 04/13/2010  . GERD 04/13/2010  . ERECTILE DYSFUNCTION 03/12/2009  . HYPERTENSION, BENIGN 03/12/2009  . CAD, NATIVE VESSEL 03/12/2009  . Hyperlipidemia 03/07/2009    Current Outpatient Medications on File Prior to Visit  Medication Sig Dispense Refill  . amLODipine (NORVASC) 5 MG tablet TAKE 2 TABLETS BY MOUTH ONCE DAILY 180 tablet 1  . atorvastatin (LIPITOR) 40 MG tablet TAKE 1 TABLET BY MOUTH ONCE DAILY 30 tablet 5  .  cetirizine (ZYRTEC) 10 MG tablet Take 10 mg by mouth as needed.    . chlorthalidone (HYGROTON) 25 MG tablet Take 0.5 tablets (12.5 mg total) by mouth daily. 90 tablet 1  . clopidogrel (PLAVIX) 75 MG tablet TAKE 1 TABLET BY MOUTH ONCE DAILY 90 tablet 1  . clotrimazole-betamethasone (LOTRISONE) cream Apply 1 application topically 2 (two) times daily.    Marland Kitchen esomeprazole (NEXIUM) 40 MG capsule TAKE ONE CAPSULE BY MOUTH ONCE DAILY 90 capsule 3  . fluticasone (FLONASE) 50 MCG/ACT nasal spray Place 2 sprays into both nostrils daily. 16 g 0  . glucose blood  (PRODIGY NO CODING BLOOD GLUC) test strip USE ONE STRIP TO CHECK GLUCOSE 4 TIMES DAILY 400 each 14  . Insulin Lispro Prot & Lispro (HUMALOG MIX 50/50 KWIKPEN) (50-50) 100 UNIT/ML Kwikpen Inject 35 Units into the skin daily with breakfast. And pen needles 1/day. 15 mL 11  . Insulin Pen Needle 31G X 6 MM MISC Use to administer lantus insulin at bedtime 90 each 3  . losartan (COZAAR) 100 MG tablet Take 1 tablet (100 mg total) by mouth daily. 90 tablet 3  . metoprolol succinate (TOPROL-XL) 50 MG 24 hr tablet TAKE 1 TABLET BY MOUTH ONCE DAILY .  TAKE  WITH  OR  IMMEDIATELLY  FOLLOWING  A  MEAL 90 tablet 0  . PRODIGY TWIST TOP LANCETS 28G MISC USE   TO CHECK GLUCOSE 4 TIMES DAILY 400 each 1  . ranitidine (ZANTAC) 150 MG tablet Take 1 tablet (150 mg total) by mouth 2 (two) times daily. 180 tablet 1  . senna-docusate (SENOKOT-S) 8.6-50 MG per tablet Take 2 tablets by mouth 2 (two) times daily. For constipation.    . sodium chloride (OCEAN) 0.65 % SOLN nasal spray Place 1 spray into both nostrils as needed for congestion. 15 mL 0  . VENTOLIN HFA 108 (90 Base) MCG/ACT inhaler Inhale 1-2 puffs into the lungs every 6 (six) hours as needed for wheezing or shortness of breath. 1 Inhaler 11   No current facility-administered medications on file prior to visit.     Past Medical History:  Diagnosis Date  . ALLERGIC RHINITIS   . CAD, NATIVE VESSEL    BMS to OM1 2001, DES to BMS 2005  . Chronic back pain    herniated disc  . Chronic combined systolic and diastolic heart failure ([place]) 06/29/2016  . Constipation    takes Carafate four times day  . DIAB W/UNSPEC COMP TYPE II/UNSPEC TYPE UNCNTRL   . ERECTILE DYSFUNCTION   . GERD   . HYPERLIPIDEMIA-MIXED   . HYPERTENSION, BENIGN   . LBBB (left bundle branch block) 02/02/2016  . MORTON'S NEUROMA, RIGHT   . Peripheral neuropathy   . Seasonal allergies    takes Allegra and Benadryl daily prn;uses Flonase daily  . SHOULDER PAIN, RIGHT   . Stroke, thrombotic  ([place]) 07/2012   L HP + hemiparesis, s/p CIR   . TIA on medication 02/2012    Past Surgical History:  Procedure Laterality Date  . ANGIOPLASTY    . COLONOSCOPY    . CORONARY ANGIOPLASTY  2005   2 stents  . DENTAL SURGERY    . LARYNGOPLASTY  08/07/2012   Procedure: LARYNGOPLASTY;  Surgeon: Izora Gala, MD;  Location: [facility];  Service: ENT;  Laterality: Left;  Left Vocal Cord Medialyzation  . left knee surgury     x 2   . stent  2001, 2004   coronary stents    Social  History   Socioeconomic History  . Marital status: Legally Separated    Spouse name: Not on file  . Number of children: 3  . Years of education: college  . Highest education level: Not on file  Occupational History  . Occupation: disabled    Employer: [employer]  Social Needs  . Financial resource strain: Not on file  . Food insecurity:    Worry: Not on file    Inability: Not on file  . Transportation needs:    Medical: Not on file    Non-medical: Not on file  Tobacco Use  . Smoking status: Former Smoker    Last attempt to quit: 02/24/1983    Years since quitting: 35.0  . Smokeless tobacco: Never Used  Substance and Sexual Activity  . Alcohol use: No    Alcohol/week: 0.0 oz  . Drug use: No  . Sexual activity: Never  Lifestyle  . Physical activity:    Days per week: Not on file    Minutes per session: Not on file  . Stress: Not on file  Relationships  . Social connections:    Talks on phone: Not on file    Gets together: Not on file    Attends religious service: Not on file    Active member of club or organization: Not on file    Attends meetings of clubs or organizations: Not on file    Relationship status: Not on file  Other Topics Concern  . Not on file  Social History Narrative   Married, lives in [place] with wife. He is disabled secondary to back pain. 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   Patient has 3 children.   Patient has college education.   Patient drinks  1 cup daily.    Family History  Problem Relation Age of Onset  . Cancer Mother     Review of Systems  Constitutional: Negative for chills and fever.  HENT:       Right jaw pain - from tooth disease - ongoing for more than one week  Eyes: Positive for visual disturbance (sometimes - ? related to elevated vision - been going on for a while).  Respiratory: Positive for cough (sometimes dry, sometimes products). Negative for shortness of breath and wheezing.   Cardiovascular: Positive for chest pain (epidose this morning) and leg swelling (sometimes). Negative for palpitations.  Gastrointestinal: Positive for nausea (sometimes).  Musculoskeletal: Positive for neck pain (sometimes  left side - stiffness, chronic).  Neurological: Positive for light-headedness (sometimes). Negative for headaches (just head pains x 1 weeks).       Objective:   Vitals:   02/24/18 0942  BP: 128/74  Pulse: 78  Resp: 16  Temp: 98.7 F (37.1 C)  SpO2: 97%   BP Readings from Last 3 Encounters:  02/24/18 128/74  02/15/18 (!) 150/80  12/20/17 138/80   Wt Readings from Last 3 Encounters:  02/24/18 216 lb (98 kg)  02/15/18 215 lb 3.2 oz (97.6 kg)  12/20/17 212 lb (96.2 kg)   Body mass index is 30.13 kg/m.   Physical Exam    Constitutional: Appears well-developed and well-nourished. No distress.  HENT:  Head: Normocephalic and atraumatic.  Neck: temple arteries mildly tender b/l L > R,  Neck supple. No tracheal deviation present. No thyromegaly present.  No cervical lymphadenopathy Cardiovascular: Normal rate, regular rhythm and normal heart sounds.   1/6 systolic murmur heard. No carotid bruit .  No edema  Pulmonary/Chest: Effort normal and breath sounds normal. No respiratory distress. No has no wheezes. No rales.  Skin: Skin is warm and dry. Not diaphoretic.  Psychiatric: Normal mood and affect. Behavior is normal.      Assessment & Plan:    See Problem List for Assessment and Plan of  chronic medical problems.

## 2018-02-23 NOTE — Patient Instructions (Addendum)
  Test(s) ordered today. Your results will be released to MyChart (or called to you) after review, usually within 72hours after test completion. If any changes need to be made, you will be notified at that same time.  Medications reviewed and updated.  No changes recommended at this time.    Please followup in 6 months   

## 2018-02-24 ENCOUNTER — Ambulatory Visit (INDEPENDENT_AMBULATORY_CARE_PROVIDER_SITE_OTHER): Payer: Medicare Other | Admitting: Internal Medicine

## 2018-02-24 ENCOUNTER — Other Ambulatory Visit (INDEPENDENT_AMBULATORY_CARE_PROVIDER_SITE_OTHER): Payer: Medicare Other

## 2018-02-24 ENCOUNTER — Other Ambulatory Visit: Payer: Self-pay

## 2018-02-24 ENCOUNTER — Encounter: Payer: Self-pay | Admitting: Internal Medicine

## 2018-02-24 ENCOUNTER — Telehealth: Payer: Self-pay | Admitting: Endocrinology

## 2018-02-24 ENCOUNTER — Ambulatory Visit (INDEPENDENT_AMBULATORY_CARE_PROVIDER_SITE_OTHER): Payer: Medicare Other | Admitting: *Deleted

## 2018-02-24 VITALS — BP 128/74 | HR 78 | Resp 16 | Ht 71.0 in | Wt 216.0 lb

## 2018-02-24 VITALS — BP 128/74 | HR 78 | Temp 98.7°F | Resp 16 | Ht 71.0 in | Wt 216.0 lb

## 2018-02-24 DIAGNOSIS — E1140 Type 2 diabetes mellitus with diabetic neuropathy, unspecified: Secondary | ICD-10-CM | POA: Diagnosis not present

## 2018-02-24 DIAGNOSIS — R519 Headache, unspecified: Secondary | ICD-10-CM | POA: Insufficient documentation

## 2018-02-24 DIAGNOSIS — N189 Chronic kidney disease, unspecified: Secondary | ICD-10-CM

## 2018-02-24 DIAGNOSIS — IMO0002 Reserved for concepts with insufficient information to code with codable children: Secondary | ICD-10-CM

## 2018-02-24 DIAGNOSIS — I10 Essential (primary) hypertension: Secondary | ICD-10-CM

## 2018-02-24 DIAGNOSIS — K219 Gastro-esophageal reflux disease without esophagitis: Secondary | ICD-10-CM

## 2018-02-24 DIAGNOSIS — E7849 Other hyperlipidemia: Secondary | ICD-10-CM

## 2018-02-24 DIAGNOSIS — Z0000 Encounter for general adult medical examination without abnormal findings: Secondary | ICD-10-CM

## 2018-02-24 DIAGNOSIS — I6330 Cerebral infarction due to thrombosis of unspecified cerebral artery: Secondary | ICD-10-CM | POA: Diagnosis not present

## 2018-02-24 DIAGNOSIS — E1165 Type 2 diabetes mellitus with hyperglycemia: Secondary | ICD-10-CM

## 2018-02-24 DIAGNOSIS — R51 Headache: Secondary | ICD-10-CM | POA: Diagnosis not present

## 2018-02-24 DIAGNOSIS — I2510 Atherosclerotic heart disease of native coronary artery without angina pectoris: Secondary | ICD-10-CM

## 2018-02-24 DIAGNOSIS — I5042 Chronic combined systolic (congestive) and diastolic (congestive) heart failure: Secondary | ICD-10-CM

## 2018-02-24 LAB — CBC WITH DIFFERENTIAL/PLATELET
Basophils Absolute: 0 10*3/uL (ref 0.0–0.1)
Basophils Relative: 0.4 % (ref 0.0–3.0)
Eosinophils Absolute: 0.2 10*3/uL (ref 0.0–0.7)
Eosinophils Relative: 2.6 % (ref 0.0–5.0)
HCT: 35.6 % — ABNORMAL LOW (ref 39.0–52.0)
Hemoglobin: 12.1 g/dL — ABNORMAL LOW (ref 13.0–17.0)
Lymphocytes Relative: 32.8 % (ref 12.0–46.0)
Lymphs Abs: 2.4 10*3/uL (ref 0.7–4.0)
MCHC: 34.1 g/dL (ref 30.0–36.0)
MCV: 90.9 fl (ref 78.0–100.0)
Monocytes Absolute: 0.8 10*3/uL (ref 0.1–1.0)
Monocytes Relative: 11.2 % (ref 3.0–12.0)
Neutro Abs: 3.9 10*3/uL (ref 1.4–7.7)
Neutrophils Relative %: 53 % (ref 43.0–77.0)
Platelets: 221 10*3/uL (ref 150.0–400.0)
RBC: 3.91 Mil/uL — ABNORMAL LOW (ref 4.22–5.81)
RDW: 12.8 % (ref 11.5–15.5)
WBC: 7.4 10*3/uL (ref 4.0–10.5)

## 2018-02-24 LAB — LIPID PANEL
Cholesterol: 101 mg/dL (ref 0–200)
HDL: 50.8 mg/dL (ref >39.00)
LDL Cholesterol: 32 mg/dL (ref 0–99)
NonHDL: 50.57
Total CHOL/HDL Ratio: 2
Triglycerides: 93 mg/dL (ref 0.0–149.0)
VLDL: 18.6 mg/dL (ref 0.0–40.0)

## 2018-02-24 LAB — COMPREHENSIVE METABOLIC PANEL
ALT: 22 U/L (ref 0–53)
AST: 22 U/L (ref 0–37)
Albumin: 4.2 g/dL (ref 3.5–5.2)
Alkaline Phosphatase: 76 U/L (ref 39–117)
BUN: 31 mg/dL — ABNORMAL HIGH (ref 6–23)
CO2: 31 mEq/L (ref 19–32)
Calcium: 9.8 mg/dL (ref 8.4–10.5)
Chloride: 101 mEq/L (ref 96–112)
Creatinine, Ser: 1.82 mg/dL — ABNORMAL HIGH (ref 0.40–1.50)
GFR: 47.84 mL/min — ABNORMAL LOW (ref >60.00)
Glucose, Bld: 169 mg/dL — ABNORMAL HIGH (ref 70–99)
Potassium: 4 mEq/L (ref 3.5–5.1)
Sodium: 139 mEq/L (ref 135–145)
Total Bilirubin: 1 mg/dL (ref 0.2–1.2)
Total Protein: 8.2 g/dL (ref 6.0–8.3)

## 2018-02-24 LAB — C-REACTIVE PROTEIN: CRP: 0.1 mg/dL — ABNORMAL LOW (ref 0.5–20.0)

## 2018-02-24 LAB — HEMOGLOBIN A1C: Hgb A1c MFr Bld: 9.3 % — ABNORMAL HIGH (ref 4.6–6.5)

## 2018-02-24 LAB — SEDIMENTATION RATE: Sed Rate: 12 mm/hr (ref 0–20)

## 2018-02-24 LAB — TSH: TSH: 1.55 u[IU]/mL (ref 0.35–4.50)

## 2018-02-24 MED ORDER — INSULIN LISPRO PROT & LISPRO (50-50 MIX) 100 UNIT/ML ~~LOC~~ SUSP: 35.0000 [IU] | Freq: Every day | SUBCUTANEOUS | 11 refills | Status: DC

## 2018-02-24 NOTE — Assessment & Plan Note (Signed)
Appears euvolemic Continue current medications

## 2018-02-24 NOTE — Assessment & Plan Note (Signed)
He did have an episode of chest pain today, which was relieved with an aspirin He does not think it was heartburn Advised him to follow-up with cardiology

## 2018-02-24 NOTE — Assessment & Plan Note (Signed)
CMP today He avoids NSAIDs Discussed drinking mainly water-he does drink plenty of fluids, but advised water was the best fluid to drink

## 2018-02-24 NOTE — Assessment & Plan Note (Signed)
Following with Dr. Loanne Drilling Sugars poorly controlled He is not compliant with a diabetic diet-stressed the importance of decreasing his sugar intake He is currently not exercising-has not exercised for the past couple weeks.  Stressed the importance of restarting

## 2018-02-24 NOTE — Telephone Encounter (Signed)
-----   Message from Dorna Leitz, [place] sent at 02/24/2018 10:05 AM EDT ----- Regarding: Insulin change I received a skype from Dr. Quay Burow nurse. She stated patient was in office & said insulin had changed to 50/50, but prescription was ever sent in. He said he tried to contact us multiple times, but I have no messages regarding this. Please advise?

## 2018-02-24 NOTE — Assessment & Plan Note (Signed)
1 week of bilateral temple pain lasting 5 minutes-hours Has other concerning symptoms, but they seem to be from other causes-blurry vision from elevated sugar, jaw pain from dental issues Pain does not seem to be typical of temporal arteritis, but will check CRP and ESR Discussed that if he experiences any vision loss or changes in his vision that are not related to elevated sugars he needs to go to the emergency room

## 2018-02-24 NOTE — Telephone Encounter (Signed)
I called to notify patient that prescription was sent to pharmacy. He said that he was unsure if it would be covered by his insurance, but would call back if it wasn't.

## 2018-02-24 NOTE — Telephone Encounter (Signed)
Ok, I have sent a prescription to your pharmacy 

## 2018-02-24 NOTE — Assessment & Plan Note (Signed)
BP well controlled Current regimen effective and well tolerated Continue current medications at current doses cmp  

## 2018-02-24 NOTE — Progress Notes (Addendum)
Subjective:   [name] is a 67 y.o. male who presents for Medicare Annual/Subsequent preventive examination.  Review of Systems:  No ROS.  Medicare Wellness Visit. Additional risk factors are reflected in the social history.  Cardiac Risk Factors include: advanced age (>6men, >13 women);diabetes mellitus;dyslipidemia;hypertension;male gender Sleep patterns: gets up 1-2 times nightly to void and sleeps 7 hours nightly.    Home Safety/Smoke Alarms: Feels safe in home. Smoke alarms in place.  Living environment; residence and Firearm Safety: 2-story house, equipment: Radio producer, Type: [place], no firearms. Lives alone, no needs for DME, good support system Seat Belt Safety/Bike Helmet: Wears seat belt.   PSA-  Lab Results  Component Value Date   PSA 0.14 08/17/2016   PSA 0.16 04/13/2010   PSA 0.13 12/26/2008       Objective:    Vitals: BP 128/74   Pulse 78   Resp 16   Ht 5\' 11"  (1.803 m)   Wt 216 lb (98 kg)   SpO2 97%   BMI 30.13 kg/m   Body mass index is 30.13 kg/m.  Advanced Directives 02/24/2018 07/20/2016 10/03/2015 10/11/2012 08/18/2012 08/14/2012 08/07/2012  Does Patient Have a Medical Advance Directive? No Yes No Patient does not have advance directive Patient does not have advance directive;Patient would like information Patient does not have advance directive Patient does not have advance directive  Copy of [place] in Chart? - No - copy requested - - - - -  Would patient like information on creating a medical advance directive? Yes (ED - Information included in AVS) - Yes - Educational materials given - Advance directive packet given Advance directive packet given Advance directive packet given  Pre-existing out of facility DNR order (yellow form or pink MOST form) - - - No No No No    Tobacco Social History   Tobacco Use  Smoking Status Former Smoker  . Last attempt to quit: 02/24/1983  . Years since quitting: 35.0  Smokeless  Tobacco Never Used     Counseling given: Not Answered  Past Medical History:  Diagnosis Date  . ALLERGIC RHINITIS   . CAD, NATIVE VESSEL    BMS to OM1 2001, DES to BMS 2005  . Chronic back pain    herniated disc  . Chronic combined systolic and diastolic heart failure ([place]) 06/29/2016  . Constipation    takes Carafate four times day  . DIAB W/UNSPEC COMP TYPE II/UNSPEC TYPE UNCNTRL   . ERECTILE DYSFUNCTION   . GERD   . HYPERLIPIDEMIA-MIXED   . HYPERTENSION, BENIGN   . LBBB (left bundle branch block) 02/02/2016  . MORTON'S NEUROMA, RIGHT   . Peripheral neuropathy   . Seasonal allergies    takes Allegra and Benadryl daily prn;uses Flonase daily  . SHOULDER PAIN, RIGHT   . Stroke, thrombotic ([place]) 07/2012   L HP + hemiparesis, s/p CIR   . TIA on medication 02/2012   Past Surgical History:  Procedure Laterality Date  . ANGIOPLASTY    . COLONOSCOPY    . CORONARY ANGIOPLASTY  2005   2 stents  . DENTAL SURGERY    . LARYNGOPLASTY  08/07/2012   Procedure: LARYNGOPLASTY;  Surgeon: Izora Gala, MD;  Location: [facility];  Service: ENT;  Laterality: Left;  Left Vocal Cord Medialyzation  . left knee surgury     x 2   . stent  2001, 2004   coronary stents   Family History  Problem Relation Age of Onset  .  Cancer Mother    Social History   Socioeconomic History  . Marital status: Legally Separated    Spouse name: Not on file  . Number of children: 3  . Years of education: college  . Highest education level: Not on file  Occupational History  . Occupation: disabled    Employer: [employer]  Social Needs  . Financial resource strain: Not very hard  . Food insecurity:    Worry: Never true    Inability: Never true  . Transportation needs:    Medical: No    Non-medical: No  Tobacco Use  . Smoking status: Former Smoker    Last attempt to quit: 02/24/1983    Years since quitting: 35.0  . Smokeless tobacco: Never Used  Substance and Sexual Activity  . Alcohol use: No     Alcohol/week: 0.0 oz  . Drug use: No  . Sexual activity: Never  Lifestyle  . Physical activity:    Days per week: 0 days    Minutes per session: 0 min  . Stress: Not on file  Relationships  . Social connections:    Talks on phone: More than three times a week    Gets together: More than three times a week    Attends religious service: More than 4 times per year    Active member of club or organization: Yes    Attends meetings of clubs or organizations: More than 4 times per year    Relationship status: Separated  Other Topics Concern  . Not on file  Social History Narrative   Married, lives in [place] with wife. He is disabled secondary to back pain. 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   Patient has 3 children.   Patient has college education.   Patient drinks 1 cup daily.    Outpatient Encounter Medications as of 02/24/2018  Medication Sig  . amLODipine (NORVASC) 5 MG tablet TAKE 2 TABLETS BY MOUTH ONCE DAILY  . atorvastatin (LIPITOR) 40 MG tablet TAKE 1 TABLET BY MOUTH ONCE DAILY  . cetirizine (ZYRTEC) 10 MG tablet Take 10 mg by mouth as needed.  . chlorthalidone (HYGROTON) 25 MG tablet Take 0.5 tablets (12.5 mg total) by mouth daily.  . clopidogrel (PLAVIX) 75 MG tablet TAKE 1 TABLET BY MOUTH ONCE DAILY  . esomeprazole (NEXIUM) 40 MG capsule TAKE ONE CAPSULE BY MOUTH ONCE DAILY  . fluticasone (FLONASE) 50 MCG/ACT nasal spray Place 2 sprays into both nostrils daily.  Marland Kitchen glucose blood (PRODIGY NO CODING BLOOD GLUC) test strip USE ONE STRIP TO CHECK GLUCOSE 4 TIMES DAILY  . losartan (COZAAR) 100 MG tablet Take 1 tablet (100 mg total) by mouth daily.  . metoprolol succinate (TOPROL-XL) 50 MG 24 hr tablet TAKE 1 TABLET BY MOUTH ONCE DAILY .  TAKE  WITH  OR  IMMEDIATELLY  FOLLOWING  A  MEAL  . PRODIGY TWIST TOP LANCETS 28G MISC USE   TO CHECK GLUCOSE 4 TIMES DAILY  . ranitidine (ZANTAC) 150 MG tablet Take 1 tablet (150 mg total) by mouth 2 (two) times  daily.  Marland Kitchen senna-docusate (SENOKOT-S) 8.6-50 MG per tablet Take 2 tablets by mouth 2 (two) times daily. For constipation.  . VENTOLIN HFA 108 (90 Base) MCG/ACT inhaler Inhale 1-2 puffs into the lungs every 6 (six) hours as needed for wheezing or shortness of breath.  . [DISCONTINUED] insulin lispro protamine-lispro (HUMALOG 50/50 MIX) (50-50) 100 UNIT/ML SUSP injection Inject into the skin 2 (two) times  daily before a meal.  . [DISCONTINUED] clotrimazole-betamethasone (LOTRISONE) cream Apply 1 application topically 2 (two) times daily.  . [DISCONTINUED] Insulin Lispro Prot & Lispro (HUMALOG MIX 50/50 KWIKPEN) (50-50) 100 UNIT/ML Kwikpen Inject 35 Units into the skin daily with breakfast. And pen needles 1/day. (Patient not taking: Reported on 02/24/2018)  . [DISCONTINUED] Insulin Pen Needle 31G X 6 MM MISC Use to administer lantus insulin at bedtime (Patient not taking: Reported on 02/24/2018)   No facility-administered encounter medications on file as of 02/24/2018.     Activities of Daily Living In your present state of health, do you have any difficulty performing the following activities: 02/24/2018  Hearing? N  Vision? N  Difficulty concentrating or making decisions? N  Walking or climbing stairs? Y  Dressing or bathing? N  Doing errands, shopping? N  Preparing Food and eating ? N  Using the Toilet? N  In the past six months, have you accidently leaked urine? N  Do you have problems with loss of bowel control? N  Managing your Medications? N  Managing your Finances? N  Housekeeping or managing your Housekeeping? N  Some recent data might be hidden    Patient Care Team: Binnie Rail, MD as PCP - General (Internal Medicine) Nahser, Wonda Cheng, MD (Cardiology) Renato Shin, MD (Endocrinology) Izora Gala, MD (Otolaryngology) Garvin Fila, MD (Neurology) Almedia Balls, MD (Orthopedic Surgery) Sable Feil, MD (Gastroenterology) Wynona Canes, MD as Consulting Physician  (Neurology)   Assessment:   This is a routine wellness examination for [employer]. Physical assessment deferred to PCP.   Exercise Activities and Dietary recommendations Current Exercise Habits: Structured exercise class, Time (Minutes): 50, Frequency (Times/Week): 2, Weekly Exercise (Minutes/Week): 100, Intensity: Mild, Exercise limited by: neurologic condition(s)(left sided weakness resiual from CVA)  Diet (meal preparation, eat out, water intake, caffeinated beverages, dairy products, fruits and vegetables): in general, a "healthy" diet     Reviewed heart healthy and diabetic diet, encouraged patient to increase daily water intake. Relevant patient education assigned to patient using Emmi of videos for diabetes diet and carbohydrate counting.  Goals    . Patient Stated     Eat a better diet and monitor the amount of carbohydrates and sugar.       Fall Risk Fall Risk  02/24/2018 08/24/2017 07/20/2016 09/09/2014  Falls in the past year? No Yes No No  Number falls in past yr: - 2 or more - -  Injury with Fall? - No - -  Risk Factor Category  - High Fall Risk - -  Risk for fall due to : Impaired balance/gait;Impaired mobility Impaired balance/gait;Impaired mobility - -    Depression Screen PHQ 2/9 Scores 02/24/2018 08/24/2017 07/20/2016 09/09/2014  PHQ - 2 Score 1 0 0 1  PHQ- 9 Score 2 - - -    Cognitive Function MMSE - Mini Mental State Exam 02/24/2018 07/20/2016  Not completed: - (No Data)  Orientation to time 5 -  Orientation to Place 5 -  Registration 3 -  Attention/ Calculation 5 -  Recall 1 -  Language- name 2 objects 2 -  Language- repeat 1 -  Language- follow 3 step command 3 -  Language- read & follow direction 1 -  Write a sentence 1 -  Copy design 1 -  Total score 28 -        Immunization History  Administered Date(s) Administered  . Td 11/22/2004  . Tdap 11/30/2011  . Tetanus 07/13/2012   Screening Tests Health  Maintenance  Topic Date Due  . INFLUENZA  VACCINE  06/22/2018  . HEMOGLOBIN A1C  08/18/2018  . OPHTHALMOLOGY EXAM  09/23/2018  . FOOT EXAM  02/16/2019  . Fecal DNA (Cologuard)  09/03/2019  . TETANUS/TDAP  07/13/2022  . Hepatitis C Screening  Completed        Plan:     [facility] referral placed to assist patient with diabetes management and stroke preventative education.  Continue doing brain stimulating activities (puzzles, reading, adult coloring books, staying active) to keep memory sharp.   Continue to eat heart healthy diet (full of fruits, vegetables, whole grains, lean protein, water--limit salt, fat, and sugar intake) and increase physical activity as tolerated. I have personally reviewed and noted the following in the patient's chart:   . Medical and social history . Use of alcohol, tobacco or illicit drugs  . Current medications and supplements . Functional ability and status . Nutritional status . Physical activity . Advanced directives . List of other physicians . Vitals . Screenings to include cognitive, depression, and falls . Referrals and appointments  In addition, I have reviewed and discussed with patient certain preventive protocols, quality metrics, and best practice recommendations. A written personalized care plan for preventive services as well as general preventive health recommendations were provided to patient.     Michiel Cowboy, RN  02/24/2018   Medical screening examination/treatment/procedure(s) were performed by non-physician practitioner and as supervising physician I was immediately available for consultation/collaboration. I agree with above. Binnie Rail, MD

## 2018-02-24 NOTE — Patient Instructions (Signed)
Continue doing brain stimulating activities (puzzles, reading, adult coloring books, staying active) to keep memory sharp.   Continue to eat heart healthy diet (full of fruits, vegetables, whole grains, lean protein, water--limit salt, fat, and sugar intake) and increase physical activity as tolerated.   [name] , Thank you for taking time to come for your Medicare Wellness Visit. I appreciate your ongoing commitment to your health goals. Please review the following plan we discussed and let me know if I can assist you in the future.   These are the goals we discussed: Goals    . Patient Stated     Eat a better diet and monitor the amount of carbohydrates and sugar.       This is a list of the screening recommended for you and due dates:  Health Maintenance  Topic Date Due  . Flu Shot  06/22/2018  . Hemoglobin A1C  08/18/2018  . Eye exam for diabetics  09/23/2018  . Complete foot exam   02/16/2019  . Cologuard (Stool DNA test)  09/03/2019  . Tetanus Vaccine  07/13/2022  .  Hepatitis C: One time screening is recommended by Center for Disease Control  (CDC) for  adults born from [birth year] through 1967.   Completed

## 2018-02-24 NOTE — Assessment & Plan Note (Signed)
Check lipid panel, continue statin Stressed to improving his diet and starting back to regular exercise

## 2018-02-24 NOTE — Assessment & Plan Note (Signed)
GERD controlled Continue daily medication  

## 2018-02-24 NOTE — Patient Outreach (Signed)
[place] [facility]) Care Management  02/24/2018  [name] 04-Jul-1951 [id number]   Telephone Screen  Referral Date: 02/24/18 Referral Source: MD office (Dr. Quay Burow) Referral Reason: " assist with the management of Diabetes and education regarding Stroke prevention" Insurance: Medicare   Outreach attempt # 1 to patient. No answer at main number listed for patient and voicemail not set up. RN CM attempted alternate number for patient and phone recording stating " this phone is managed by screened by smart call blocker." RN CM unable to get through to speak with patient.     Plan: RN CM will make outreach attempt to patient within three business days.    [name], RN,BSN,CCM [place] Management Telephonic Care Management Coordinator Direct Phone: [phone] Toll Free: [phone] Fax: [phone]

## 2018-02-25 ENCOUNTER — Other Ambulatory Visit: Payer: Self-pay | Admitting: Internal Medicine

## 2018-02-27 ENCOUNTER — Encounter: Payer: Self-pay | Admitting: Emergency Medicine

## 2018-02-27 ENCOUNTER — Other Ambulatory Visit: Payer: Self-pay

## 2018-02-27 NOTE — Patient Outreach (Signed)
[place] [facility]) Care Management  02/27/2018  [name] 1951/09/16 [id number]    Telephone Screen  Referral Date: 02/24/18 Referral Source: MD office (Dr. Quay Burow) Referral Reason: " assist with the management of Diabetes and education regarding Stroke prevention" Insurance: Medicare   Outreach attempt #2 to patient. No answer at present and unable to leave voicemail. Alternate number attempted and unable to get through.      Plan: RN CM will send unsuccessful outreach letter to patient.  RN CM will make outreach attempt to patient within 3-4 business days.    [name], RN,BSN,CCM [place] Management Telephonic Care Management Coordinator Direct Phone: [phone] Toll Free: [phone] Fax: [phone]

## 2018-03-02 ENCOUNTER — Other Ambulatory Visit: Payer: Self-pay

## 2018-03-02 NOTE — Patient Outreach (Signed)
[facility]) Care Management  03/02/2018  [name] July 31, 1951 [id number]    Telephone Screen  Referral Date:02/24/18 Referral Source:MD office (Dr. Quay Burow) Referral Reason:" assist with the management of Diabetes and education regarding Stroke prevention" Insurance:Medicare    Outreach attempt #3 to patient. No answer at cell phone number listed as primary number for patient. Voicemail box full and unable to lave message. RN CM the other two alternate numbers listed.  [phone] number is disconnected. [phone] RN CM unable to get through pass call recording statement: " this phone is managed by screened by smart call blocker." RN CM unable to get through to speak with patient. RN CM attempted to reach emergency contact/dtr-[name](ROI on file). A male answered and reported that she was not available.     Plan: RN CM has made three unsuccessful attempts to reach patient on different days. Case will be closed if no response from letter mailed to patient.    [name], RN,BSN,CCM [place] Management Telephonic Care Management Coordinator Direct Phone: [phone] Toll Free: [phone] Fax: [phone]

## 2018-03-03 ENCOUNTER — Ambulatory Visit: Payer: Self-pay

## 2018-03-04 ENCOUNTER — Other Ambulatory Visit: Payer: Self-pay | Admitting: Internal Medicine

## 2018-03-09 ENCOUNTER — Other Ambulatory Visit: Payer: Self-pay

## 2018-03-09 NOTE — Patient Outreach (Signed)
[facility]) Care Management  03/09/2018  [name] May 01, 1951 [id number]     Telephone Screen  Referral Date:02/24/18 Referral Source:MD office (Dr. Quay Burow) Referral Reason:" assist with the management of Diabetes and education regarding Stroke prevention" Insurance:Medicare    Multiple attempts to establish contact with patient without success. No response from letter mailed to patient. Case is being closed at this time.     Plan: RN CM will close case at this time. RN CM will send MD case closure letter.    [name], RN,BSN,CCM [place] Management Telephonic Care Management Coordinator Direct Phone: [phone] Toll Free: [phone] Fax: [phone]

## 2018-03-13 ENCOUNTER — Ambulatory Visit (INDEPENDENT_AMBULATORY_CARE_PROVIDER_SITE_OTHER): Payer: Medicare Other | Admitting: Internal Medicine

## 2018-03-13 ENCOUNTER — Encounter: Payer: Self-pay | Admitting: Internal Medicine

## 2018-03-13 ENCOUNTER — Other Ambulatory Visit (INDEPENDENT_AMBULATORY_CARE_PROVIDER_SITE_OTHER): Payer: Medicare Other

## 2018-03-13 DIAGNOSIS — J0120 Acute ethmoidal sinusitis, unspecified: Secondary | ICD-10-CM | POA: Insufficient documentation

## 2018-03-13 DIAGNOSIS — J0140 Acute pansinusitis, unspecified: Secondary | ICD-10-CM

## 2018-03-13 DIAGNOSIS — J019 Acute sinusitis, unspecified: Secondary | ICD-10-CM | POA: Insufficient documentation

## 2018-03-13 DIAGNOSIS — I2510 Atherosclerotic heart disease of native coronary artery without angina pectoris: Secondary | ICD-10-CM

## 2018-03-13 LAB — CBC
HCT: 36.5 % — ABNORMAL LOW (ref 39.0–52.0)
Hemoglobin: 12.4 g/dL — ABNORMAL LOW (ref 13.0–17.0)
MCHC: 34 g/dL (ref 30.0–36.0)
MCV: 90.2 fl (ref 78.0–100.0)
Platelets: 246 10*3/uL (ref 150.0–400.0)
RBC: 4.05 Mil/uL — ABNORMAL LOW (ref 4.22–5.81)
RDW: 12.5 % (ref 11.5–15.5)
WBC: 8.1 10*3/uL (ref 4.0–10.5)

## 2018-03-13 LAB — COMPREHENSIVE METABOLIC PANEL
ALT: 21 U/L (ref 0–53)
AST: 20 U/L (ref 0–37)
Albumin: 4.2 g/dL (ref 3.5–5.2)
Alkaline Phosphatase: 69 U/L (ref 39–117)
BUN: 21 mg/dL (ref 6–23)
CO2: 31 mEq/L (ref 19–32)
Calcium: 9.9 mg/dL (ref 8.4–10.5)
Chloride: 101 mEq/L (ref 96–112)
Creatinine, Ser: 1.62 mg/dL — ABNORMAL HIGH (ref 0.40–1.50)
GFR: 54.71 mL/min — ABNORMAL LOW (ref >60.00)
Glucose, Bld: 184 mg/dL — ABNORMAL HIGH (ref 70–99)
Potassium: 4 mEq/L (ref 3.5–5.1)
Sodium: 140 mEq/L (ref 135–145)
Total Bilirubin: 0.8 mg/dL (ref 0.2–1.2)
Total Protein: 8.1 g/dL (ref 6.0–8.3)

## 2018-03-13 MED ORDER — DOXYCYCLINE HYCLATE 100 MG PO TABS: 100.0000 mg | ORAL_TABLET | Freq: Two times a day (BID) | ORAL | 0 refills | Status: DC

## 2018-03-13 MED ORDER — BENZONATATE 200 MG PO CAPS: 200.0000 mg | ORAL_CAPSULE | Freq: Three times a day (TID) | ORAL | 0 refills | Status: DC | PRN

## 2018-03-13 MED ORDER — FLUTICASONE PROPIONATE 50 MCG/ACT NA SUSP: 2.0000 | Freq: Every day | NASAL | 0 refills | Status: DC

## 2018-03-13 NOTE — Progress Notes (Signed)
   Subjective:    Patient ID: [name], male    DOB: 12-01-1950, 67 y.o.   MRN: [id number]  HPI The patient is a 67 YO man coming in for coughing and sinus pressure. Going on for about 1 week or more recently. Is having some chills. Is concerned about cancer in his sinuses. States he is dropping weight lately unintentionally (chart review indicates stable weight over the last 1-2 years). A lot of non-productive coughing which is causing him to be SOB. Has not used inhaler. Taking zyrtec daily but not flonase lately. Does have headaches, sinus pressure, nose drainage. Some ear pain as well.   Review of Systems  Constitutional: Positive for activity change, appetite change and chills. Negative for fatigue, fever and unexpected weight change.  HENT: Positive for congestion, ear pain, postnasal drip, rhinorrhea, sinus pressure and sinus pain. Negative for ear discharge, sneezing, sore throat, tinnitus, trouble swallowing and voice change.   Eyes: Negative.   Respiratory: Positive for cough and shortness of breath. Negative for chest tightness and wheezing.   Cardiovascular: Negative.   Gastrointestinal: Negative.   Musculoskeletal: Positive for myalgias.  Neurological: Negative.       Objective:   Physical Exam  Constitutional: He is oriented to person, place, and time. He appears well-developed and well-nourished.  HENT:  Head: Normocephalic and atraumatic.  Oropharynx with redness and clear drainage, nose with swollen turbinates, TMs normal bilaterally  Eyes: EOM are normal.  Neck: Normal range of motion. No thyromegaly present.  Cardiovascular: Normal rate and regular rhythm.  Pulmonary/Chest: Effort normal and breath sounds normal. No respiratory distress. He has no wheezes. He has no rales.  Abdominal: Soft.  Musculoskeletal: He exhibits tenderness.  Lymphadenopathy:    He has no cervical adenopathy.  Neurological: He is alert and oriented to person, place, and time.    Skin: Skin is warm and dry.   Vitals:   03/13/18 1127  BP: 122/80  Pulse: 78  Temp: 99 F (37.2 C)  TempSrc: Oral  SpO2: 98%  Weight: 213 lb (96.6 kg)  Height: 5\' 11"  (1.803 m)      Assessment & Plan:

## 2018-03-13 NOTE — Assessment & Plan Note (Signed)
Rx for doxycycline, flonase and tessalon perles. Call back if not improved and he can be re-evaluated by ENT. Checking CBC and recheck BMP as last Cr up from usual.

## 2018-03-13 NOTE — Patient Instructions (Signed)
We have sent in doxycyline to take for the sinuses. Take 1 pill twice a day for 1 week.   We have sent in the refill of the flonase which is the nose spray. Use 2 sprays in each side once a day. Use this for the next 2 weeks or so.   We have sent in cough medicine tessalon perles to use up to 3 times per day for the coughing.

## 2018-04-20 ENCOUNTER — Encounter: Payer: Self-pay | Admitting: Endocrinology

## 2018-04-20 ENCOUNTER — Ambulatory Visit (INDEPENDENT_AMBULATORY_CARE_PROVIDER_SITE_OTHER): Payer: Medicare Other | Admitting: Endocrinology

## 2018-04-20 VITALS — BP 130/90 | HR 93 | Ht 71.0 in | Wt 214.0 lb

## 2018-04-20 DIAGNOSIS — E1165 Type 2 diabetes mellitus with hyperglycemia: Secondary | ICD-10-CM | POA: Diagnosis not present

## 2018-04-20 DIAGNOSIS — E1140 Type 2 diabetes mellitus with diabetic neuropathy, unspecified: Secondary | ICD-10-CM | POA: Diagnosis not present

## 2018-04-20 DIAGNOSIS — IMO0002 Reserved for concepts with insufficient information to code with codable children: Secondary | ICD-10-CM

## 2018-04-20 DIAGNOSIS — I2510 Atherosclerotic heart disease of native coronary artery without angina pectoris: Secondary | ICD-10-CM

## 2018-04-20 MED ORDER — INSULIN LISPRO PROT & LISPRO (50-50 MIX) 100 UNIT/ML ~~LOC~~ SUSP
40.0000 [IU] | Freq: Every day | SUBCUTANEOUS | 11 refills | Status: DC
Start: 2018-04-20 — End: 2018-04-20

## 2018-04-20 MED ORDER — INSULIN LISPRO PROT & LISPRO (50-50 MIX) 100 UNIT/ML KWIKPEN: 50.0000 [IU] | PEN_INJECTOR | Freq: Every day | SUBCUTANEOUS | 11 refills | Status: DC

## 2018-04-20 NOTE — Patient Instructions (Addendum)
Please increase the "50/50" insulin to 40 units each morning with breakfast.  On this type of insulin schedule, you should eat meals on a regular schedule (especially breakfast and lunch).  If a meal is missed or significantly delayed, your blood sugar could go low.   To help you remember it, put the pen next to your breakfast.  It does not have to be refrigerated.   Please come back for a follow-up appointment in 2 months.  check your blood sugar twice a day.  vary the time of day when you check, between before the 3 meals, and at bedtime.  also check if you have symptoms of your blood sugar being too high or too low.  please keep a record of the readings and bring it to your next appointment here (or you can bring the meter itself).  You can write it on any piece of paper.  please call us sooner if your blood sugar goes below 70, or if you have a lot of readings over 200.

## 2018-04-20 NOTE — Progress Notes (Signed)
Subjective:    Patient ID: [name], male    DOB: 1951-01-19, 67 y.o.   MRN: [id number]  HPI Pt returns for f/u of diabetes mellitus: DM type: Insulin-requiring type 2 Dx'ed: 8466 Complications: polyneuropathy, CAD, renal insufficiency, retinopathy, and TIA.   Therapy: insulin since 2011 DKA: never.  Severe hypoglycemia: never.  Pancreatitis: never.  Other: He says he cannot use syringe and vial, due to tremor; he changed lantus to NPH, then 70/30, then 50/50, due to pattern on cbg's; he declines multiple daily injections.   Interval history: Pt says he still sometimes misses the insulin.  no cbg record, but states cbg's vary from 150-200's.   Past Medical History:  Diagnosis Date  . ALLERGIC RHINITIS   . CAD, NATIVE VESSEL    BMS to OM1 2001, DES to BMS 2005  . Chronic back pain    herniated disc  . Chronic combined systolic and diastolic heart failure ([place]) 06/29/2016  . Constipation    takes Carafate four times day  . DIAB W/UNSPEC COMP TYPE II/UNSPEC TYPE UNCNTRL   . ERECTILE DYSFUNCTION   . GERD   . HYPERLIPIDEMIA-MIXED   . HYPERTENSION, BENIGN   . LBBB (left bundle branch block) 02/02/2016  . MORTON'S NEUROMA, RIGHT   . Peripheral neuropathy   . Seasonal allergies    takes Allegra and Benadryl daily prn;uses Flonase daily  . SHOULDER PAIN, RIGHT   . 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 TIA on medication 02/2012    Past Surgical History:  Procedure Laterality Date  . ANGIOPLASTY    . COLONOSCOPY    . CORONARY ANGIOPLASTY  2005   2 stents  . DENTAL SURGERY    . LARYNGOPLASTY  08/07/2012   Procedure: LARYNGOPLASTY;  Surgeon: Izora Gala, MD;  Location: [facility];  Service: ENT;  Laterality: Left;  Left Vocal Cord Medialyzation  . left knee surgury     x 2   . stent  2001, 2004   coronary stents    Social History   Socioeconomic History  . Marital status: Legally Separated    Spouse name: Not on file  . Number of  children: 3  . Years of education: college  . Highest education level: Not on file  Occupational History  . Occupation: disabled    Employer: [employer]  Social Needs  . Financial resource strain: Not very hard  . Food insecurity:    Worry: Never true    Inability: Never true  . Transportation needs:    Medical: No    Non-medical: No  Tobacco Use  . Smoking status: Former Smoker    Last attempt to quit: 02/24/1983    Years since quitting: 35.1  . Smokeless tobacco: Never Used  Substance and Sexual Activity  . Alcohol use: No    Alcohol/week: 0.0 oz  . Drug use: No  . Sexual activity: Never  Lifestyle  . Physical activity:    Days per week: 2 days    Minutes per session: 50 min  . Stress: To some extent  Relationships  . Social connections:    Talks on phone: More than three times a week    Gets together: More than three times a week    Attends religious service: More than 4 times per year    Active member of club or organization: Yes    Attends meetings of clubs or organizations: More than 4 times per year  Relationship status: Separated  . Intimate partner violence:    Fear of current or ex partner: Not on file    Emotionally abused: Not on file    Physically abused: Not on file    Forced sexual activity: Not on file  Other Topics Concern  . Not on file  Social History Narrative       Current Outpatient Medications on File Prior to Visit  Medication Sig Dispense Refill  . amLODipine (NORVASC) 5 MG tablet TAKE 2 TABLETS BY MOUTH ONCE DAILY 180 tablet 1  . atorvastatin (LIPITOR) 40 MG tablet TAKE 1 TABLET BY MOUTH ONCE DAILY 30 tablet 5  . cetirizine (ZYRTEC) 10 MG tablet Take 10 mg by mouth as needed.    . chlorthalidone (HYGROTON) 25 MG tablet Take 0.5 tablets (12.5 mg total) by mouth daily. 90 tablet 1  . clopidogrel (PLAVIX) 75 MG tablet TAKE 1 TABLET BY MOUTH ONCE DAILY 90 tablet 1  . esomeprazole (NEXIUM) 40 MG capsule TAKE ONE CAPSULE BY MOUTH ONCE DAILY  90 capsule 3  . fluticasone (FLONASE) 50 MCG/ACT nasal spray Place 2 sprays into both nostrils daily. 16 g 0  . glucose blood (PRODIGY NO CODING BLOOD GLUC) test strip USE ONE STRIP TO CHECK GLUCOSE 4 TIMES DAILY 400 each 14  . losartan (COZAAR) 100 MG tablet Take 1 tablet (100 mg total) by mouth daily. 90 tablet 3  . metoprolol succinate (TOPROL-XL) 50 MG 24 hr tablet TAKE 1 TABLET BY MOUTH ONCE DAILY .  TAKE  WITH  OR  IMMEDIATELLY  FOLLOWING  A  MEAL 90 tablet 0  . PRODIGY TWIST TOP LANCETS 28G MISC USE   TO CHECK GLUCOSE 4 TIMES DAILY 400 each 1  . ranitidine (ZANTAC) 150 MG tablet TAKE 1 TABLET BY MOUTH TWICE DAILY 180 tablet 3  . senna-docusate (SENOKOT-S) 8.6-50 MG per tablet Take 2 tablets by mouth 2 (two) times daily. For constipation.    . VENTOLIN HFA 108 (90 Base) MCG/ACT inhaler Inhale 1-2 puffs into the lungs every 6 (six) hours as needed for wheezing or shortness of breath. 1 Inhaler 11  . benzonatate (TESSALON) 200 MG capsule Take 1 capsule (200 mg total) by mouth 3 (three) times daily as needed. (Patient not taking: Reported on 04/20/2018) 60 capsule 0   No current facility-administered medications on file prior to visit.     Allergies  Allergen Reactions  . Penicillins Anaphylaxis  . Pneumococcal Vaccines Anaphylaxis  . Shellfish Allergy Anaphylaxis  . Influenza Vaccines   . Lisinopril     cough  . Topiramate Other (See Comments)    Chest spasms and numbness    Family History  Problem Relation Age of Onset  . Cancer Mother     BP 130/90 (BP Location: Left Arm, Patient Position: Sitting, Cuff Size: Normal)   Pulse 93   Ht 5\' 11"  (1.803 m)   Wt 214 lb (97.1 kg)   SpO2 97%   BMI 29.85 kg/m    Review of Systems He denies hypoglycemia.      Objective:   Physical Exam VITAL SIGNS:  See vs page GENERAL: no distress Pulses: foot pulses are intact bilaterally.   MSK: no deformity of the feet or ankles.  CV: no edema of the legs or ankles Skin:  no ulcer on  the feet or ankles.  normal color and temp on the feet and ankles Neuro: sensation is intact to touch on the feet and ankles, but decreased from normal.  Lab Results  Component Value Date   HGBA1C 9.3 (H) 02/24/2018      Assessment & Plan:  Insulin-requiring type 2 DM, with DR: worse Renal insuff: in this setting, he needs a fast-acting premixed qd insulin  Patient Instructions  Please increase the "50/50" insulin to 40 units each morning with breakfast.  On this type of insulin schedule, you should eat meals on a regular schedule (especially breakfast and lunch).  If a meal is missed or significantly delayed, your blood sugar could go low.   To help you remember it, put the pen next to your breakfast.  It does not have to be refrigerated.   Please come back for a follow-up appointment in 2 months.  check your blood sugar twice a day.  vary the time of day when you check, between before the 3 meals, and at bedtime.  also check if you have symptoms of your blood sugar being too high or too low.  please keep a record of the readings and bring it to your next appointment here (or you can bring the meter itself).  You can write it on any piece of paper.  please call us sooner if your blood sugar goes below 70, or if you have a lot of readings over 200.

## 2018-05-02 ENCOUNTER — Other Ambulatory Visit: Payer: Self-pay | Admitting: Internal Medicine

## 2018-05-03 MED ORDER — AMLODIPINE BESYLATE 5 MG PO TABS: 10.0000 mg | ORAL_TABLET | Freq: Every day | ORAL | 1 refills | Status: DC

## 2018-05-03 NOTE — Addendum Note (Signed)
Addended by: Terence Lux B on: 05/03/2018 03:31 PM   Modules accepted: Orders

## 2018-05-03 NOTE — Addendum Note (Signed)
Addended by: Terence Lux B on: 05/03/2018 07:18 AM   Modules accepted: Orders

## 2018-05-14 ENCOUNTER — Other Ambulatory Visit: Payer: Self-pay | Admitting: Internal Medicine

## 2018-06-20 ENCOUNTER — Ambulatory Visit (INDEPENDENT_AMBULATORY_CARE_PROVIDER_SITE_OTHER): Payer: Medicare Other | Admitting: Endocrinology

## 2018-06-20 ENCOUNTER — Telehealth: Payer: Self-pay | Admitting: Emergency Medicine

## 2018-06-20 VITALS — BP 124/78 | HR 71 | Temp 97.9°F | Ht 71.0 in | Wt 220.0 lb

## 2018-06-20 DIAGNOSIS — E1165 Type 2 diabetes mellitus with hyperglycemia: Secondary | ICD-10-CM | POA: Diagnosis not present

## 2018-06-20 DIAGNOSIS — E1140 Type 2 diabetes mellitus with diabetic neuropathy, unspecified: Secondary | ICD-10-CM | POA: Diagnosis not present

## 2018-06-20 DIAGNOSIS — IMO0002 Reserved for concepts with insufficient information to code with codable children: Secondary | ICD-10-CM

## 2018-06-20 DIAGNOSIS — I2510 Atherosclerotic heart disease of native coronary artery without angina pectoris: Secondary | ICD-10-CM

## 2018-06-20 LAB — POCT GLYCOSYLATED HEMOGLOBIN (HGB A1C): Hemoglobin A1C: 8 % — AB (ref 4.0–5.6)

## 2018-06-20 MED ORDER — INSULIN LISPRO PROT & LISPRO (50-50 MIX) 100 UNIT/ML KWIKPEN: 45.0000 [IU] | PEN_INJECTOR | Freq: Every day | SUBCUTANEOUS | 11 refills | Status: DC

## 2018-06-20 NOTE — Telephone Encounter (Signed)
Pt called and stated his prescription for Insulin Lispro Prot & Lispro (HUMALOG MIX 50/50 KWIKPEN) (50-50) 100 UNIT/ML [name] is too expensive. He is wondering if he can get something else that is cheaper or a program to help bring the cost down. Please advise and call the patient back thanks.

## 2018-06-20 NOTE — Telephone Encounter (Signed)
Instead, you can take [facility] and [facility] NPH, 22 units of each, with breakfast. . [facility] do you want me to send it to?

## 2018-06-20 NOTE — Progress Notes (Signed)
Subjective:    Patient ID: [name], male    DOB: [birth year], 67 y.o.   MRN: [id number]  HPI Pt returns for f/u of diabetes mellitus: DM type: Insulin-requiring type 2 Dx'ed: 6644 Complications: polyneuropathy, CAD, renal insufficiency, retinopathy, and TIA.   Therapy: insulin since 2011 DKA: never.  Severe hypoglycemia: never.  Pancreatitis: never.  Other: He says he cannot use syringe and vial, due to tremor; he changed lantus to NPH, then 70/30, then 50/50, due to pattern on cbg's; he declines multiple daily injections.  Interval history: Pt says he misses the insulin 1-2 doses per month.  no cbg record, but states cbg's vary from 148-270. It is in general higher as the day goes on.  He takes 40 units qam.  Past Medical History:  Diagnosis Date  . ALLERGIC RHINITIS   . CAD, NATIVE VESSEL    BMS to OM1 2001, DES to BMS 2005  . Chronic back pain    herniated disc  . Chronic combined systolic and diastolic heart failure ([place]) 06/29/2016  . Constipation    takes Carafate four times day  . DIAB W/UNSPEC COMP TYPE II/UNSPEC TYPE UNCNTRL   . ERECTILE DYSFUNCTION   . GERD   . HYPERLIPIDEMIA-MIXED   . HYPERTENSION, BENIGN   . LBBB (left bundle branch block) 02/02/2016  . MORTON'S NEUROMA, RIGHT   . Peripheral neuropathy   . Seasonal allergies    takes Allegra and Benadryl daily prn;uses Flonase daily  . SHOULDER PAIN, RIGHT   . 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 TIA on medication 02/2012    Past Surgical History:  Procedure Laterality Date  . ANGIOPLASTY    . COLONOSCOPY    . CORONARY ANGIOPLASTY  2005   2 stents  . DENTAL SURGERY    . LARYNGOPLASTY  08/07/2012   Procedure: LARYNGOPLASTY;  Surgeon: Izora Gala, MD;  Location: [facility];  Service: ENT;  Laterality: Left;  Left Vocal Cord Medialyzation  . left knee surgury     x 2   . stent  2001, 2004   coronary stents    Social History   Socioeconomic History  . Marital status:  Legally Separated    Spouse name: Not on file  . Number of children: 3  . Years of education: college  . Highest education level: Not on file  Occupational History  . Occupation: disabled    Employer: [employer]  Social Needs  . Financial resource strain: Not very hard  . Food insecurity:    Worry: Never true    Inability: Never true  . Transportation needs:    Medical: No    Non-medical: No  Tobacco Use  . Smoking status: Former Smoker    Last attempt to quit: 02/24/1983    Years since quitting: 35.3  . Smokeless tobacco: Never Used  Substance and Sexual Activity  . Alcohol use: No    Alcohol/week: 0.0 oz  . Drug use: No  . Sexual activity: Never  Lifestyle  . Physical activity:    Days per week: 2 days    Minutes per session: 50 min  . Stress: To some extent  Relationships  . Social connections:    Talks on phone: More than three times a week    Gets together: More than three times a week    Attends religious service: More than 4 times per year    Active member of club or organization: Yes  Attends meetings of clubs or organizations: More than 4 times per year    Relationship status: Separated  . Intimate partner violence:    Fear of current or ex partner: Not on file    Emotionally abused: Not on file    Physically abused: Not on file    Forced sexual activity: Not on file  Other Topics Concern  . Not on file  Social History Narrative       Current Outpatient Medications on File Prior to Visit  Medication Sig Dispense Refill  . amLODipine (NORVASC) 5 MG tablet Take 2 tablets (10 mg total) by mouth daily. 180 tablet 1  . atorvastatin (LIPITOR) 40 MG tablet TAKE 1 TABLET BY MOUTH ONCE DAILY 30 tablet 5  . cetirizine (ZYRTEC) 10 MG tablet Take 10 mg by mouth as needed.    . chlorthalidone (HYGROTON) 25 MG tablet Take 0.5 tablets (12.5 mg total) by mouth daily. 90 tablet 1  . clopidogrel (PLAVIX) 75 MG tablet TAKE 1 TABLET BY MOUTH ONCE DAILY 90 tablet 1  .  esomeprazole (NEXIUM) 40 MG capsule TAKE ONE CAPSULE BY MOUTH ONCE DAILY 90 capsule 3  . fluticasone (FLONASE) 50 MCG/ACT nasal spray Place 2 sprays into both nostrils daily. 16 g 0  . glucose blood (PRODIGY NO CODING BLOOD GLUC) test strip USE ONE STRIP TO CHECK GLUCOSE 4 TIMES DAILY 400 each 14  . losartan (COZAAR) 100 MG tablet Take 1 tablet (100 mg total) by mouth daily. 90 tablet 3  . metoprolol succinate (TOPROL-XL) 50 MG 24 hr tablet TAKE 1 TABLET BY MOUTH ONCE DAILY .  TAKE  WITH  OR  IMMEDIATELLY  FOLLOWING  A  MEAL 90 tablet 1  . PRODIGY TWIST TOP LANCETS 28G MISC USE   TO CHECK GLUCOSE 4 TIMES DAILY 400 each 1  . ranitidine (ZANTAC) 150 MG tablet TAKE 1 TABLET BY MOUTH TWICE DAILY 180 tablet 3  . RELION PEN NEEDLES 32G X 4 MM MISC USE 1 ONCE DAILY AS DIRECTED  11  . senna-docusate (SENOKOT-S) 8.6-50 MG per tablet Take 2 tablets by mouth 2 (two) times daily. For constipation.    . VENTOLIN HFA 108 (90 Base) MCG/ACT inhaler Inhale 1-2 puffs into the lungs every 6 (six) hours as needed for wheezing or shortness of breath. 1 Inhaler 11   No current facility-administered medications on file prior to visit.     Allergies  Allergen Reactions  . Penicillins Anaphylaxis  . Pneumococcal Vaccines Anaphylaxis  . Shellfish Allergy Anaphylaxis  . Influenza Vaccines   . Lisinopril     cough  . Topiramate Other (See Comments)    Chest spasms and numbness    Family History  Problem Relation Age of Onset  . Cancer Mother     BP 124/78 (BP Location: Left Arm, Patient Position: Sitting, Cuff Size: Normal)   Pulse 71   Temp 97.9 F (36.6 C) (Oral)   Ht 5\' 11"  (1.803 m)   Wt 220 lb (99.8 kg)   SpO2 99%   BMI 30.68 kg/m    Review of Systems He denies hypoglycemia.     Objective:   Physical Exam VITAL SIGNS:  See vs page GENERAL: no distress Pulses: dorsalis pedis intact bilat.   MSK: no deformity of the feet CV: no leg edema Skin:  no ulcer on the feet.  normal color and temp  on the feet.  Neuro: sensation is intact to touch on the feet.      Lab  Results  Component Value Date   HGBA1C 8.0 (A) 06/20/2018       Assessment & Plan:  Insulin-requiring type 2 DM, with renal insuff: he needs increased rx   Patient Instructions  Please increase the "50/50" insulin to 45 units each morning with breakfast.  On this type of insulin schedule, you should eat meals on a regular schedule (especially breakfast and lunch).  If a meal is missed or significantly delayed, your blood sugar could go low.   To help you remember it, put the pen next to your breakfast.  It does not have to be refrigerated.   Please come back for a follow-up appointment in 2-3 months.  check your blood sugar twice a day.  vary the time of day when you check, between before the 3 meals, and at bedtime.  also check if you have symptoms of your blood sugar being too high or too low.  please keep a record of the readings and bring it to your next appointment here (or you can bring the meter itself).  You can write it on any piece of paper.  please call us sooner if your blood sugar goes below 70, or if you have a lot of readings over 200.

## 2018-06-20 NOTE — Telephone Encounter (Signed)
Would you like to change? Please advise

## 2018-06-20 NOTE — Patient Instructions (Addendum)
Please increase the "50/50" insulin to 45 units each morning with breakfast.  On this type of insulin schedule, you should eat meals on a regular schedule (especially breakfast and lunch).  If a meal is missed or significantly delayed, your blood sugar could go low.   To help you remember it, put the pen next to your breakfast.  It does not have to be refrigerated.   Please come back for a follow-up appointment in 2-3 months.  check your blood sugar twice a day.  vary the time of day when you check, between before the 3 meals, and at bedtime.  also check if you have symptoms of your blood sugar being too high or too low.  please keep a record of the readings and bring it to your next appointment here (or you can bring the meter itself).  You can write it on any piece of paper.  please call us sooner if your blood sugar goes below 70, or if you have a lot of readings over 200.

## 2018-06-21 NOTE — Telephone Encounter (Signed)
I LVM for patient to call back so that I could suggest the cheaper alternative & send to pharmacy.

## 2018-07-03 ENCOUNTER — Other Ambulatory Visit: Payer: Self-pay | Admitting: Internal Medicine

## 2018-07-10 ENCOUNTER — Telehealth: Payer: Self-pay | Admitting: Endocrinology

## 2018-07-10 NOTE — Telephone Encounter (Signed)
Patient called to check the status of the application for patient's insulin (application to be sent to [place]). Patient needs to pick up application. Please call patient at ph# [phone] when he can pick up application. Application was dropped off 07/03/18.

## 2018-07-10 NOTE — Telephone Encounter (Signed)
I have called patient to let him know that [employer] paperwork was ready to pick up. He will ring his portion so it can be faxed back.

## 2018-07-13 ENCOUNTER — Ambulatory Visit: Payer: Self-pay

## 2018-07-13 NOTE — Telephone Encounter (Signed)
Patient called in with c/o "ankle swelling due to a fall." He says "yesterday I fell on my left side and my left buttock hurts and my left ankle is swollen. 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 The swelling did start yesterday after the fall and I have a little calf pain." I asked how swollen, he says "it's twice the size of my right ankle." According to protocol, see PCP within 24 hours, appointment scheduled for tomorrow at 0930 with Dr. Quay Burow, care advice given, patient verbalized understanding.   Reason for Disposition . [1] Very swollen joint AND [2] no fever  Answer Assessment - Initial Assessment Questions 1. LOCATION: "Which joint is swollen?"     Left ankle 2. ONSET: "When did the swelling start?"     Yesterday after falling 3. SIZE: "How large is the swelling?"     Left ankle almost twice the size of the right 4. PAIN: "Is there any pain?" If so, ask: "How bad is it?" (Scale 1-10; or mild, moderate, severe)     Hard to tell due to paralysis 5. CAUSE: "What do you think caused the swollen joint?"     Fall 6. OTHER SYMPTOMS: "Do you have any other symptoms?" (e.g., fever, chest pain, difficulty breathing, calf pain)     A little left calf pain, left buttock pain  7. PREGNANCY: "Is there any chance you are pregnant?" "When was your last menstrual period?"     N/A  Protocols used: ANKLE SWELLING-A-AH

## 2018-07-13 NOTE — Progress Notes (Signed)
Subjective:    Patient ID: [name], male    DOB: 05/06/1951, 67 y.o.   MRN: [id number]  HPI The patient is here for an acute visit.  He slipped and fell two days ago and he landed on his left buttock region. That area is very sore.   He thought he was ok but the next day he started swelling in his legs.  Both legs were swollen but the left leg was worse.  He is unsure if he landed on his left ankle or if he twisted it.  He iced the legs yesterday and that helped a little.  His right leg is now back to normal, but he has slightly increased swelling in his left ankle and it is painful.   Last night he had a pinching sensation in his chest.  He was sitting when it started.   He took ASA 325 mg and it went away.  No chest pain since then.  He has sweating and lightheadedness with the chest pain.  He has had this chest pain in the past and has discussed it with his cardiologist.  He only has had this on occasion.   Medications and allergies reviewed with patient and updated if appropriate.  Patient Active Problem List   Diagnosis Date Noted  . Acute non-recurrent pansinusitis 03/13/2018  . Nonintractable headache 02/24/2018  . CKD (chronic kidney disease) 02/22/2017  . Anemia 02/16/2017  . Chronic combined systolic and diastolic heart failure ([place]) 06/29/2016  . LBBB (left bundle branch block) 02/02/2016  . Nonallopathic lesion of lumbosacral region 12/11/2014  . Nonallopathic lesion of sacral region 12/11/2014  . Nonallopathic lesion of thoracic region 12/11/2014  . Arthritis of left hip 11/20/2014  . Ischial bursitis of left side 10/28/2014  . Hamstring tightness of left lower extremity 09/16/2014  . Piriformis syndrome of left side 09/16/2014  . Dysphonia 06/22/2013  . Hemiparesis affecting left side as late effect of stroke ([place]) 10/10/2012  . Dysphagia following cerebrovascular accident 08/14/2012  . Chronic back pain   . Peripheral neuropathy ([place])   . TIA  (transient ischemic attack) 04/08/2012  . Stroke ([place]) 02/21/2012  . [place], RIGHT 05/29/2010  . Type 2 diabetes, uncontrolled, with neuropathy ([place]) 04/13/2010  . ALLERGIC RHINITIS 04/13/2010  . GERD 04/13/2010  . ERECTILE DYSFUNCTION 03/12/2009  . HYPERTENSION, BENIGN 03/12/2009  . CAD, NATIVE VESSEL 03/12/2009  . Hyperlipidemia 03/07/2009    Current Outpatient Medications on File Prior to Visit  Medication Sig Dispense Refill  . amLODipine (NORVASC) 5 MG tablet Take 2 tablets (10 mg total) by mouth daily. 180 tablet 1  . atorvastatin (LIPITOR) 40 MG tablet TAKE 1 TABLET BY MOUTH ONCE DAILY 30 tablet 5  . cetirizine (ZYRTEC) 10 MG tablet Take 10 mg by mouth as needed.    . chlorthalidone (HYGROTON) 25 MG tablet Take 0.5 tablets (12.5 mg total) by mouth daily. 90 tablet 1  . clopidogrel (PLAVIX) 75 MG tablet TAKE 1 TABLET BY MOUTH ONCE DAILY 90 tablet 1  . esomeprazole (NEXIUM) 40 MG capsule TAKE ONE CAPSULE BY MOUTH ONCE DAILY 90 capsule 3  . fluticasone (FLONASE) 50 MCG/ACT nasal spray Place 2 sprays into both nostrils daily. 16 g 0  . glucose blood (PRODIGY NO CODING BLOOD GLUC) test strip USE ONE STRIP TO CHECK GLUCOSE 4 TIMES DAILY 400 each 14  . Insulin Lispro Prot & Lispro (HUMALOG MIX 50/50 KWIKPEN) (50-50) 100 UNIT/ML Kwikpen Inject 45 Units into the skin daily with  breakfast. And pen needles 1/day 30 mL 11  . losartan (COZAAR) 100 MG tablet TAKE 1 TABLET BY MOUTH ONCE DAILY 90 tablet 1  . metoprolol succinate (TOPROL-XL) 50 MG 24 hr tablet TAKE 1 TABLET BY MOUTH ONCE DAILY .  TAKE  WITH  OR  IMMEDIATELLY  FOLLOWING  A  MEAL 90 tablet 1  . PRODIGY TWIST TOP LANCETS 28G MISC USE   TO CHECK GLUCOSE 4 TIMES DAILY 400 each 1  . ranitidine (ZANTAC) 150 MG tablet TAKE 1 TABLET BY MOUTH TWICE DAILY 180 tablet 3  . RELION PEN NEEDLES 32G X 4 MM MISC USE 1 ONCE DAILY AS DIRECTED  11  . senna-docusate (SENOKOT-S) 8.6-50 MG per tablet Take 2 tablets by mouth 2 (two) times daily.  For constipation.    . VENTOLIN HFA 108 (90 Base) MCG/ACT inhaler Inhale 1-2 puffs into the lungs every 6 (six) hours as needed for wheezing or shortness of breath. 1 Inhaler 11   No current facility-administered medications on file prior to visit.     Past Medical History:  Diagnosis Date  . ALLERGIC RHINITIS   . CAD, NATIVE VESSEL    BMS to OM1 2001, DES to BMS 2005  . Chronic back pain    herniated disc  . Chronic combined systolic and diastolic heart failure ([place]) 06/29/2016  . Constipation    takes Carafate four times day  . DIAB W/UNSPEC COMP TYPE II/UNSPEC TYPE UNCNTRL   . ERECTILE DYSFUNCTION   . GERD   . HYPERLIPIDEMIA-MIXED   . HYPERTENSION, BENIGN   . LBBB (left bundle branch block) 02/02/2016  . MORTON'S NEUROMA, RIGHT   . Peripheral neuropathy   . Seasonal allergies    takes Allegra and Benadryl daily prn;uses Flonase daily  . SHOULDER PAIN, RIGHT   . 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 TIA on medication 02/2012    Past Surgical History:  Procedure Laterality Date  . ANGIOPLASTY    . COLONOSCOPY    . CORONARY ANGIOPLASTY  2005   2 stents  . DENTAL SURGERY    . LARYNGOPLASTY  08/07/2012   Procedure: LARYNGOPLASTY;  Surgeon: Izora Gala, MD;  Location: [facility] [facility];  Service: ENT;  Laterality: Left;  Left Vocal Cord Medialyzation  . left knee surgury     x 2   . stent  2001, 2004   coronary stents    Social History   Socioeconomic History  . Marital status: Legally Separated    Spouse name: Not on file  . Number of children: 3  . Years of education: college  . Highest education level: Not on file  Occupational History  . Occupation: disabled    Employer: [employer]  Social Needs  . Financial resource strain: Not very hard  . Food insecurity:    Worry: Never true    Inability: Never true  . Transportation needs:    Medical: No    Non-medical: No  Tobacco Use  . Smoking status: Former Smoker    Last attempt to quit:  02/24/1983    Years since quitting: 35.4  . Smokeless tobacco: Never Used  Substance and Sexual Activity  . Alcohol use: No    Alcohol/week: 0.0 standard drinks  . Drug use: No  . Sexual activity: Never  Lifestyle  . Physical activity:    Days per week: 2 days    Minutes per session: 50 min  . Stress: To some extent  Relationships  . Social connections:    Talks on phone: More than three times a week    Gets together: More than three times a week    Attends religious service: More than 4 times per year    Active member of club or organization: Yes    Attends meetings of clubs or organizations: More than 4 times per year    Relationship status: Separated  Other Topics Concern  . Not on file  Social History Narrative       Family History  Problem Relation Age of Onset  . Cancer Mother     Review of Systems  Constitutional: Positive for diaphoresis (with chest pain last night). Negative for chills and fever.  Respiratory: Positive for cough (if he swallows incorrectly). Negative for shortness of breath and wheezing.   Cardiovascular: Positive for chest pain and leg swelling. Negative for palpitations.  Musculoskeletal: Positive for arthralgias.  Skin: Negative for color change (No bruising).  Neurological: Positive for light-headedness (with chest pain last night) and numbness (chornic in feet and L shoulder). Negative for headaches.       Objective:   Vitals:   07/14/18 0916  BP: 132/80  Pulse: 69  Resp: 14  Temp: 98.1 F (36.7 C)  SpO2: 98%   BP Readings from Last 3 Encounters:  07/14/18 132/80  06/20/18 124/78  04/20/18 130/90   Wt Readings from Last 3 Encounters:  07/14/18 221 lb (100.2 kg)  06/20/18 220 lb (99.8 kg)  04/20/18 214 lb (97.1 kg)   Body mass index is 30.82 kg/m.   Physical Exam  Constitutional: He appears well-developed and well-nourished. No distress.  HENT:  Head: Normocephalic and atraumatic.  Musculoskeletal: He exhibits edema  (Trace bilateral lower extremity edema, left lower extremity slightly worse-nonpitting).  Tenderness with palpation left buttock region, pain with walking and changing position.  Left ankle with tenderness in the anterior aspect and medial aspect of the ankle.  Full range of motion, but increased pain with movement of the ankle.  No deformity  Neurological: No sensory deficit.  Skin: Skin is warm and dry. He is not diaphoretic. No erythema (No bruising noted left ankle).           Assessment & Plan:    See Problem List for Assessment and Plan of chronic medical problems.

## 2018-07-14 ENCOUNTER — Encounter: Payer: Self-pay | Admitting: Internal Medicine

## 2018-07-14 ENCOUNTER — Ambulatory Visit (INDEPENDENT_AMBULATORY_CARE_PROVIDER_SITE_OTHER): Payer: Medicare Other | Admitting: Internal Medicine

## 2018-07-14 ENCOUNTER — Ambulatory Visit (INDEPENDENT_AMBULATORY_CARE_PROVIDER_SITE_OTHER)
Admission: RE | Admit: 2018-07-14 | Discharge: 2018-07-14 | Disposition: A | Discharge status: Home / Self Care | Payer: Medicare Other | Source: Ambulatory Visit | Attending: Internal Medicine | Admitting: Internal Medicine

## 2018-07-14 VITALS — BP 132/80 | HR 69 | Temp 98.1°F | Resp 14 | Ht 71.0 in | Wt 221.0 lb

## 2018-07-14 DIAGNOSIS — I2510 Atherosclerotic heart disease of native coronary artery without angina pectoris: Secondary | ICD-10-CM | POA: Diagnosis not present

## 2018-07-14 DIAGNOSIS — M25572 Pain in left ankle and joints of left foot: Secondary | ICD-10-CM | POA: Diagnosis not present

## 2018-07-14 DIAGNOSIS — M25552 Pain in left hip: Secondary | ICD-10-CM

## 2018-07-14 DIAGNOSIS — R079 Chest pain, unspecified: Secondary | ICD-10-CM | POA: Diagnosis not present

## 2018-07-14 NOTE — Assessment & Plan Note (Signed)
He fell yesterday does not recall how he injured his ankle, but likely twisted it He does have some anterior with increased pain with movement Slight swelling, but no bruising We will check an x-ray Advised him this is likely a sprain and may take weeks to completely heal Continue ice If no improvement consider sports medicine referral

## 2018-07-14 NOTE — Patient Instructions (Signed)
Have x-rays today.  Test(s) ordered today. Your results will be released to [facility] (or called to you) after review, usually within 72hours after test completion. If any changes need to be made, you will be notified at that same time.   Continue to ice the ankle.  Call if no improvement     If you continue to have chest pain - call cardiology.

## 2018-07-14 NOTE — Assessment & Plan Note (Signed)
Left hip/pelvic/buttock pain Fall yesterday and landed on this area Unlikely a fracture, but will obtain x-ray Discussed that this will improve over time and if it does not or if it worsens can refer to sports medicine

## 2018-07-14 NOTE — Assessment & Plan Note (Signed)
He had a pinching chest pain sensation that was very transient last night He took a full aspirin and that seemed to resolve He does follow with cardiology and has had this in the past and discussed it with him If he experiences this again advised him to call his cardiologist and discuss with them

## 2018-07-16 ENCOUNTER — Other Ambulatory Visit: Payer: Self-pay | Admitting: Internal Medicine

## 2018-07-19 ENCOUNTER — Telehealth: Payer: Self-pay

## 2018-07-19 NOTE — Telephone Encounter (Signed)
We received notification from [facility] that patient dose qualify for patient assistance for the next 12 months.

## 2018-07-23 ENCOUNTER — Other Ambulatory Visit: Payer: Self-pay | Admitting: Cardiovascular Disease

## 2018-08-27 NOTE — Patient Instructions (Addendum)
  Tests ordered today. Your results will be released to MyChart (or called to you) after review, usually within 72hours after test completion. If any changes need to be made, you will be notified at that same time.   Medications reviewed and updated.  Changes include :  none   Your prescription(s) have been submitted to your pharmacy. Please take as directed and contact our office if you believe you are having problem(s) with the medication(s).   Please followup in 6 months   

## 2018-08-27 NOTE — Progress Notes (Signed)
Subjective:    Patient ID: [name], male    DOB: January 09, 1951, 67 y.o.   MRN: [id number]  HPI The patient is here for follow up.  Sweating/lightheadedness:  He has episodes 1-2 times a week of feeling lightheadedness, sweaty and eating something helps.  He has not checked his sugars when this occurs.    Diabetes: he follows with Dr Loanne Drilling.  He is taking his medication daily as prescribed. He is not compliant with a diabetic diet - he is eating too many sweets. He is not exercising regularly. He monitors his sugars and they have been running 170-200's. He checks his feet daily and denies foot lesions. He is up-to-date with an ophthalmology examination.   CAD, combined systolic and diastolic HF, Hypertension: He is taking his medication daily. He is compliant with a low sodium diet.  He denies chest pain, palpitations, shortness of breath and regular headaches. He is not exercising regularly.  He does not monitor his blood pressure at home.    GERD:  He is taking his medication daily as prescribed.  He denies any GERD symptoms and feels his GERD is well controlled.   Hyperlipidemia: He is taking his medication daily. He is compliant with a low fat/cholesterol diet. He is not exercising regularly. He denies myalgias.   CKD:  He drinks plenty of fluids.  He does not take any nsaids.    Medications and allergies reviewed with patient and updated if appropriate.  Patient Active Problem List   Diagnosis Date Noted  . Left hip pain 07/14/2018  . Acute left ankle pain 07/14/2018  . Chest pain 07/14/2018  . Nonintractable headache 02/24/2018  . CKD (chronic kidney disease) 02/22/2017  . Anemia 02/16/2017  . Chronic combined systolic and diastolic heart failure ([place]) 06/29/2016  . LBBB (left bundle branch block) 02/02/2016  . Nonallopathic lesion of lumbosacral region 12/11/2014  . Nonallopathic lesion of sacral region 12/11/2014  . Nonallopathic lesion of thoracic region  12/11/2014  . Arthritis of left hip 11/20/2014  . Ischial bursitis of left side 10/28/2014  . Hamstring tightness of left lower extremity 09/16/2014  . Piriformis syndrome of left side 09/16/2014  . Dysphonia 06/22/2013  . Hemiparesis affecting left side as late effect of stroke ([place]) 10/10/2012  . Dysphagia following cerebrovascular accident 08/14/2012  . Chronic back pain   . Peripheral neuropathy ([place])   . TIA (transient ischemic attack) 04/08/2012  . Stroke ([place]) 02/21/2012  . [place], RIGHT 05/29/2010  . Type 2 diabetes, uncontrolled, with neuropathy ([place]) 04/13/2010  . ALLERGIC RHINITIS 04/13/2010  . GERD 04/13/2010  . ERECTILE DYSFUNCTION 03/12/2009  . HYPERTENSION, BENIGN 03/12/2009  . CAD, NATIVE VESSEL 03/12/2009  . Hyperlipidemia 03/07/2009    Current Outpatient Medications on File Prior to Visit  Medication Sig Dispense Refill  . amLODipine (NORVASC) 5 MG tablet Take 2 tablets (10 mg total) by mouth daily. 180 tablet 1  . atorvastatin (LIPITOR) 40 MG tablet Take 1 tablet (40 mg total) by mouth daily. NEED OV. 90 tablet 0  . cetirizine (ZYRTEC) 10 MG tablet Take 10 mg by mouth as needed.    . chlorthalidone (HYGROTON) 25 MG tablet TAKE 1 TABLET BY MOUTH ONCE DAILY 90 tablet 1  . clopidogrel (PLAVIX) 75 MG tablet TAKE 1 TABLET BY MOUTH ONCE DAILY 90 tablet 1  . esomeprazole (NEXIUM) 40 MG capsule Take 1 capsule (40 mg total) by mouth daily. NEED OV. 90 capsule 0  . fluticasone (FLONASE) 50 MCG/ACT nasal  spray Place 2 sprays into both nostrils daily. 16 g 0  . glucose blood (PRODIGY NO CODING BLOOD GLUC) test strip USE ONE STRIP TO CHECK GLUCOSE 4 TIMES DAILY 400 each 14  . Insulin Lispro Prot & Lispro (HUMALOG MIX 50/50 KWIKPEN) (50-50) 100 UNIT/ML Kwikpen Inject 45 Units into the skin daily with breakfast. And pen needles 1/day 30 mL 11  . losartan (COZAAR) 100 MG tablet TAKE 1 TABLET BY MOUTH ONCE DAILY 90 tablet 1  . metoprolol succinate (TOPROL-XL) 50 MG 24 hr  tablet TAKE 1 TABLET BY MOUTH ONCE DAILY .  TAKE  WITH  OR  IMMEDIATELLY  FOLLOWING  A  MEAL 90 tablet 1  . NON FORMULARY vasinox cardiovascular suppor    . PRODIGY TWIST TOP LANCETS 28G MISC USE   TO CHECK GLUCOSE 4 TIMES DAILY 400 each 1  . RELION PEN NEEDLES 32G X 4 MM MISC USE 1 ONCE DAILY AS DIRECTED  11  . senna-docusate (SENOKOT-S) 8.6-50 MG per tablet Take 2 tablets by mouth 2 (two) times daily. For constipation.     No current facility-administered medications on file prior to visit.     Past Medical History:  Diagnosis Date  . ALLERGIC RHINITIS   . CAD, NATIVE VESSEL    BMS to OM1 2001, DES to BMS 2005  . Chronic back pain    herniated disc  . Chronic combined systolic and diastolic heart failure ([place]) 06/29/2016  . Constipation    takes Carafate four times day  . DIAB W/UNSPEC COMP TYPE II/UNSPEC TYPE UNCNTRL   . ERECTILE DYSFUNCTION   . GERD   . HYPERLIPIDEMIA-MIXED   . HYPERTENSION, BENIGN   . LBBB (left bundle branch block) 02/02/2016  . MORTON'S NEUROMA, RIGHT   . Peripheral neuropathy   . Seasonal allergies    takes Allegra and Benadryl daily prn;uses Flonase daily  . SHOULDER PAIN, RIGHT   . 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 TIA on medication 02/2012    Past Surgical History:  Procedure Laterality Date  . ANGIOPLASTY    . COLONOSCOPY    . CORONARY ANGIOPLASTY  2005   2 stents  . DENTAL SURGERY    . LARYNGOPLASTY  08/07/2012   Procedure: LARYNGOPLASTY;  Surgeon: Izora Gala, MD;  Location: [place];  Service: ENT;  Laterality: Left;  Left Vocal Cord Medialyzation  . left knee surgury     x 2   . stent  2001, 2004   coronary stents    Social History   Socioeconomic History  . Marital status: Legally Separated    Spouse name: Not on file  . Number of children: 3  . Years of education: college  . Highest education level: Not on file  Occupational History  . Occupation: disabled    Employer: [employer]  Social Needs  .  Financial resource strain: Not very hard  . Food insecurity:    Worry: Never true    Inability: Never true  . Transportation needs:    Medical: No    Non-medical: No  Tobacco Use  . Smoking status: Former Smoker    Last attempt to quit: 02/24/1983    Years since quitting: 35.5  . Smokeless tobacco: Never Used  Substance and Sexual Activity  . Alcohol use: No    Alcohol/week: 0.0 standard drinks  . Drug use: No  . Sexual activity: Never  Lifestyle  . Physical activity:    Days  per week: 2 days    Minutes per session: 50 min  . Stress: To some extent  Relationships  . Social connections:    Talks on phone: More than three times a week    Gets together: More than three times a week    Attends religious service: More than 4 times per year    Active member of club or organization: Yes    Attends meetings of clubs or organizations: More than 4 times per year    Relationship status: Separated  Other Topics Concern  . Not on file  Social History Narrative       Family History  Problem Relation Age of Onset  . Cancer Mother     Review of Systems  Constitutional: Positive for diaphoresis (with low sugars ?). Negative for chills and fever.  Respiratory: Positive for cough (dry or occ productive). Negative for shortness of breath and wheezing.   Cardiovascular: Positive for leg swelling. Negative for chest pain and palpitations.  Neurological: Positive for light-headedness. Negative for headaches.       Objective:   Vitals:   08/28/18 0937  BP: 124/68  Pulse: 76  Resp: 16  Temp: 98.3 F (36.8 C)  SpO2: 98%   BP Readings from Last 3 Encounters:  08/28/18 124/68  07/14/18 132/80  06/20/18 124/78   Wt Readings from Last 3 Encounters:  08/28/18 224 lb 12.8 oz (102 kg)  07/14/18 221 lb (100.2 kg)  06/20/18 220 lb (99.8 kg)   Body mass index is 31.35 kg/m.   Physical Exam    Constitutional: Appears well-developed and well-nourished. No distress.  HENT:  Head:  Normocephalic and atraumatic.  Neck: Neck supple. No tracheal deviation present. No thyromegaly present.  No cervical lymphadenopathy Cardiovascular: Normal rate, regular rhythm and normal heart sounds.   No murmur heard. No carotid bruit .  No edema Pulmonary/Chest: Effort normal and breath sounds normal. No respiratory distress. No has no wheezes. No rales.  Skin: Skin is warm and dry. Not diaphoretic.  Psychiatric: Normal mood and affect. Behavior is normal.      Assessment & Plan:    See Problem List for Assessment and Plan of chronic medical problems.

## 2018-08-28 ENCOUNTER — Other Ambulatory Visit (INDEPENDENT_AMBULATORY_CARE_PROVIDER_SITE_OTHER): Payer: Medicare Other

## 2018-08-28 ENCOUNTER — Ambulatory Visit (INDEPENDENT_AMBULATORY_CARE_PROVIDER_SITE_OTHER): Payer: Medicare Other | Admitting: Internal Medicine

## 2018-08-28 ENCOUNTER — Encounter: Payer: Self-pay | Admitting: Internal Medicine

## 2018-08-28 VITALS — BP 124/68 | HR 76 | Temp 98.3°F | Resp 16 | Ht 71.0 in | Wt 224.8 lb

## 2018-08-28 DIAGNOSIS — E7849 Other hyperlipidemia: Secondary | ICD-10-CM

## 2018-08-28 DIAGNOSIS — N189 Chronic kidney disease, unspecified: Secondary | ICD-10-CM

## 2018-08-28 DIAGNOSIS — E1165 Type 2 diabetes mellitus with hyperglycemia: Secondary | ICD-10-CM

## 2018-08-28 DIAGNOSIS — I5042 Chronic combined systolic (congestive) and diastolic (congestive) heart failure: Secondary | ICD-10-CM | POA: Diagnosis not present

## 2018-08-28 DIAGNOSIS — IMO0002 Reserved for concepts with insufficient information to code with codable children: Secondary | ICD-10-CM

## 2018-08-28 DIAGNOSIS — I2510 Atherosclerotic heart disease of native coronary artery without angina pectoris: Secondary | ICD-10-CM | POA: Diagnosis not present

## 2018-08-28 DIAGNOSIS — E1140 Type 2 diabetes mellitus with diabetic neuropathy, unspecified: Secondary | ICD-10-CM

## 2018-08-28 DIAGNOSIS — I10 Essential (primary) hypertension: Secondary | ICD-10-CM

## 2018-08-28 DIAGNOSIS — K219 Gastro-esophageal reflux disease without esophagitis: Secondary | ICD-10-CM | POA: Diagnosis not present

## 2018-08-28 LAB — COMPREHENSIVE METABOLIC PANEL
ALT: 26 U/L (ref 0–53)
AST: 21 U/L (ref 0–37)
Albumin: 4.2 g/dL (ref 3.5–5.2)
Alkaline Phosphatase: 83 U/L (ref 39–117)
BUN: 26 mg/dL — ABNORMAL HIGH (ref 6–23)
CO2: 30 mEq/L (ref 19–32)
Calcium: 9.5 mg/dL (ref 8.4–10.5)
Chloride: 103 mEq/L (ref 96–112)
Creatinine, Ser: 1.74 mg/dL — ABNORMAL HIGH (ref 0.40–1.50)
GFR: 50.31 mL/min — ABNORMAL LOW (ref >60.00)
Glucose, Bld: 184 mg/dL — ABNORMAL HIGH (ref 70–99)
Potassium: 3.6 mEq/L (ref 3.5–5.1)
Sodium: 139 mEq/L (ref 135–145)
Total Bilirubin: 0.7 mg/dL (ref 0.2–1.2)
Total Protein: 8 g/dL (ref 6.0–8.3)

## 2018-08-28 LAB — CBC WITH DIFFERENTIAL/PLATELET
Basophils Absolute: 0.1 10*3/uL (ref 0.0–0.1)
Basophils Relative: 0.9 % (ref 0.0–3.0)
Eosinophils Absolute: 0.3 10*3/uL (ref 0.0–0.7)
Eosinophils Relative: 3.7 % (ref 0.0–5.0)
HCT: 34.5 % — ABNORMAL LOW (ref 39.0–52.0)
Hemoglobin: 11.7 g/dL — ABNORMAL LOW (ref 13.0–17.0)
Lymphocytes Relative: 30.4 % (ref 12.0–46.0)
Lymphs Abs: 2.1 10*3/uL (ref 0.7–4.0)
MCHC: 33.8 g/dL (ref 30.0–36.0)
MCV: 91.4 fl (ref 78.0–100.0)
Monocytes Absolute: 0.7 10*3/uL (ref 0.1–1.0)
Monocytes Relative: 10.5 % (ref 3.0–12.0)
Neutro Abs: 3.7 10*3/uL (ref 1.4–7.7)
Neutrophils Relative %: 54.5 % (ref 43.0–77.0)
Platelets: 215 10*3/uL (ref 150.0–400.0)
RBC: 3.78 Mil/uL — ABNORMAL LOW (ref 4.22–5.81)
RDW: 13.1 % (ref 11.5–15.5)
WBC: 6.8 10*3/uL (ref 4.0–10.5)

## 2018-08-28 LAB — LIPID PANEL
Cholesterol: 110 mg/dL (ref 0–200)
HDL: 49.6 mg/dL (ref >39.00)
LDL Cholesterol: 34 mg/dL (ref 0–99)
NonHDL: 59.92
Total CHOL/HDL Ratio: 2
Triglycerides: 131 mg/dL (ref 0.0–149.0)
VLDL: 26.2 mg/dL (ref 0.0–40.0)

## 2018-08-28 LAB — HEMOGLOBIN A1C: Hgb A1c MFr Bld: 7.7 % — ABNORMAL HIGH (ref 4.6–6.5)

## 2018-08-28 MED ORDER — CLOPIDOGREL BISULFATE 75 MG PO TABS: 75.0000 mg | ORAL_TABLET | Freq: Every day | ORAL | 1 refills | Status: DC

## 2018-08-28 MED ORDER — PRODIGY TWIST TOP LANCETS 28G MISC: 1 refills | Status: DC

## 2018-08-28 NOTE — Assessment & Plan Note (Signed)
BP well controlled Current regimen effective and well tolerated Continue current medications at current doses Cmp, cbc

## 2018-08-28 NOTE — Assessment & Plan Note (Signed)
Management per Dr Loanne Drilling Having some possible low sugars - advised him to check his sugars when this occurs - always has something with him to eat Discuss events with Dr Loanne Drilling so medication can be adjusted Stressed better compliance with a diabetic diet and regular exercise Work on weight loss a1c today

## 2018-08-28 NOTE — Assessment & Plan Note (Signed)
GERD controlled Continue daily medication - nexium 40 mg daily He is also take zantac and will stop with the recent recall - discussed that he can take pepcid if needed

## 2018-08-28 NOTE — Assessment & Plan Note (Signed)
Check lipid panel  Continue daily statin Regular exercise and healthy diet encouraged  

## 2018-08-28 NOTE — Assessment & Plan Note (Signed)
euvolemic on exam Controlled, stable Continue current dose of medications

## 2018-08-28 NOTE — Assessment & Plan Note (Signed)
Continue increased fluids Does not take any nsaids cmp

## 2018-08-29 ENCOUNTER — Encounter: Payer: Self-pay | Admitting: Internal Medicine

## 2018-09-01 ENCOUNTER — Encounter: Payer: Self-pay | Admitting: Cardiovascular Disease

## 2018-09-01 ENCOUNTER — Ambulatory Visit (INDEPENDENT_AMBULATORY_CARE_PROVIDER_SITE_OTHER): Payer: Medicare Other | Admitting: Cardiovascular Disease

## 2018-09-01 VITALS — BP 132/74 | HR 71 | Ht 71.0 in | Wt 224.0 lb

## 2018-09-01 DIAGNOSIS — I739 Peripheral vascular disease, unspecified: Secondary | ICD-10-CM | POA: Diagnosis not present

## 2018-09-01 DIAGNOSIS — I2510 Atherosclerotic heart disease of native coronary artery without angina pectoris: Secondary | ICD-10-CM

## 2018-09-01 DIAGNOSIS — I10 Essential (primary) hypertension: Secondary | ICD-10-CM | POA: Diagnosis not present

## 2018-09-01 DIAGNOSIS — Z79899 Other long term (current) drug therapy: Secondary | ICD-10-CM

## 2018-09-01 DIAGNOSIS — E785 Hyperlipidemia, unspecified: Secondary | ICD-10-CM | POA: Diagnosis not present

## 2018-09-01 DIAGNOSIS — Z955 Presence of coronary angioplasty implant and graft: Secondary | ICD-10-CM

## 2018-09-01 MED ORDER — ISOSORB DINITRATE-HYDRALAZINE 20-37.5 MG PO TABS: 1.0000 | ORAL_TABLET | Freq: Two times a day (BID) | ORAL | 5 refills | Status: DC

## 2018-09-01 NOTE — Progress Notes (Signed)
Cardiology Office Note   Date:  09/01/2018   ID:  [name], DOB [birth year], MRN [id number]  PCP:  Binnie Rail, MD  Cardiologist:   Skeet Latch, MD   No chief complaint on file.    History of Present Illness: [name] is a 67 y.o. male with hypertension, CAD status post PCI, stroke, diabetes mellitus type 2, hyperlipidemia, and prior tobacco abuse who presents for follow up.  [name] was referred by Dr. Billey Gosling on 02/09/16.  At that time he reported occasional episodes of chest pain relieved by aspirin.  He denied exertional chest pain but did note dizziness and nausea.  Additionally, he was noted to have a left bundle branch block that was not present on his last EKG in 2013. Therefore, he was referred for exercise Myoview 02/13/16 that revealed LVEF 39% with global hypokinesis but no ischemia.  Echo 03/01/16 revealed LV 40-45% with mild LVH and grade 2 diastolic dysfunction. There was also mild hypokinesis of the mid to apical anteroseptum.   He works with our pharmacists and has achieved very good blood pressure control.  He is in a program at the [place] that checks his BP daily.    [name] had 2 falls in the winter time that he attributed to losing his balance. There was no preceding chest pain or palpitations.  Since his last appointment he has been feeling well.  For the last 1.5 months he is noted episodes of feeling sweaty and lightheaded.  He tends to notice this when doing errands.  He has been struggling with low blood sugars.  During these episodes his glucose has been 70 or lower.  It improves with having something to eat.  He has not had any chest pain or pressure lately and his breathing has been stable.  He has chronic lower extremity edema to the ankles that seems to get better in the mornings but recurs during the day.  He has no orthopnea or PND.  He likes to ride the recumbent bike or walk but has not been exercising much lately.  He struggles  with lack of time as he takes care of his grandchildren during the day.  He also sometimes gets pain and tightness in his calves.  It sometimes occurs when sitting or standing but he also has it when walking.  It is been ongoing for years and seems to be getting a little worse lately.   Past Medical History:  Diagnosis Date  . ALLERGIC RHINITIS   . CAD, NATIVE VESSEL    BMS to OM1 2001, DES to BMS 2005  . Chronic back pain    herniated disc  . Chronic combined systolic and diastolic heart failure ([place]) 06/29/2016  . Constipation    takes Carafate four times day  . DIAB W/UNSPEC COMP TYPE II/UNSPEC TYPE UNCNTRL   . ERECTILE DYSFUNCTION   . GERD   . HYPERLIPIDEMIA-MIXED   . HYPERTENSION, BENIGN   . LBBB (left bundle branch block) 02/02/2016  . MORTON'S NEUROMA, RIGHT   . Peripheral neuropathy   . Seasonal allergies    takes Allegra and Benadryl daily prn;uses Flonase daily  . SHOULDER PAIN, RIGHT   . 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 TIA on medication 02/2012    Past Surgical History:  Procedure Laterality Date  . ANGIOPLASTY    . COLONOSCOPY    . CORONARY ANGIOPLASTY  2005   2 stents  .  DENTAL SURGERY    . LARYNGOPLASTY  08/07/2012   Procedure: LARYNGOPLASTY;  Surgeon: Izora Gala, MD;  Location: [facility];  Service: ENT;  Laterality: Left;  Left Vocal Cord Medialyzation  . left knee surgury     x 2   . stent  2001, 2004   coronary stents     Current Outpatient Medications  Medication Sig Dispense Refill  . atorvastatin (LIPITOR) 40 MG tablet Take 1 tablet (40 mg total) by mouth daily. NEED OV. 90 tablet 0  . cetirizine (ZYRTEC) 10 MG tablet Take 10 mg by mouth as needed.    . chlorthalidone (HYGROTON) 25 MG tablet TAKE 1 TABLET BY MOUTH ONCE DAILY 90 tablet 1  . clopidogrel (PLAVIX) 75 MG tablet Take 1 tablet (75 mg total) by mouth daily. 90 tablet 1  . esomeprazole (NEXIUM) 40 MG capsule Take 1 capsule (40 mg total) by mouth daily. NEED OV.  90 capsule 0  . fluticasone (FLONASE) 50 MCG/ACT nasal spray Place 2 sprays into both nostrils daily. 16 g 0  . glucose blood (PRODIGY NO CODING BLOOD GLUC) test strip USE ONE STRIP TO CHECK GLUCOSE 4 TIMES DAILY 400 each 14  . Insulin Lispro Prot & Lispro (HUMALOG MIX 50/50 KWIKPEN) (50-50) 100 UNIT/ML Kwikpen Inject 45 Units into the skin daily with breakfast. And pen needles 1/day 30 mL 11  . losartan (COZAAR) 100 MG tablet TAKE 1 TABLET BY MOUTH ONCE DAILY 90 tablet 1  . metoprolol succinate (TOPROL-XL) 50 MG 24 hr tablet TAKE 1 TABLET BY MOUTH ONCE DAILY .  TAKE  WITH  OR  IMMEDIATELLY  FOLLOWING  A  MEAL 90 tablet 1  . NON FORMULARY vasinox cardiovascular suppor    . PRODIGY TWIST TOP LANCETS 28G MISC USE   TO CHECK GLUCOSE 4 TIMES DAILY 400 each 1  . RELION PEN NEEDLES 32G X 4 MM MISC USE 1 ONCE DAILY AS DIRECTED  11  . senna-docusate (SENOKOT-S) 8.6-50 MG per tablet Take 2 tablets by mouth 2 (two) times daily. For constipation.    . isosorbide-hydrALAZINE (BIDIL) 20-37.5 MG tablet Take 1 tablet by mouth 2 (two) times daily. 60 tablet 5   No current facility-administered medications for this visit.     Allergies:   Penicillins; Pneumococcal vaccines; Shellfish allergy; Influenza vaccines; Lisinopril; and Topiramate    Social History:  The patient  reports that he quit smoking about 35 years ago. He has never used smokeless tobacco. He reports that he does not drink alcohol or use drugs.   Family History:  The patient's family history includes Cancer in his mother.    ROS:  Please see the history of present illness.   Otherwise, review of systems are positive for L side weakness since his stroke and cough.  All other systems are reviewed and negative.    PHYSICAL EXAM: VS:  BP 132/74   Pulse 71   Ht 5\' 11"  (1.803 m)   Wt 224 lb (101.6 kg)   BMI 31.24 kg/m  , BMI Body mass index is 31.24 kg/m. GENERAL:  Well appearing HEENT: Pupils equal round and reactive, fundi not  visualized, oral mucosa unremarkable NECK:  No jugular venous distention, waveform within normal limits, carotid upstroke brisk and symmetric, no bruits LUNGS:  Clear to auscultation bilaterally HEART:  RRR.  PMI not displaced or sustained,S1 and S2 within normal limits, no S3, no S4, no clicks, no rubs, no murmurs ABD:  Flat, positive bowel sounds normal in frequency in  pitch, no bruits, no rebound, no guarding, no midline pulsatile mass, no hepatomegaly, no splenomegaly EXT:  2 plus pulses throughout, 1+ pitting edema to the ankles bilaterally, no cyanosis no clubbing SKIN:  No rashes no nodules NEURO:  Cranial nerves II through XII grossly intact.  L UE/LE weakness and spacticity. PSYCH:  Cognitively intact, oriented to person place and time   EKG:  EKG is ordered today. The ekg ordered 3//13/17 demonstrates sinus rhythm rate 64 bpm.  LBBB.   07/20/17: Sinus rhythm. Rate 74 bpm. Left bundle branch block. 09/01/18; Sinus rhythm.  Rate 71 bpm.  LBBB  Echo 03/01/16: Study Conclusions  - Left ventricle: The cavity size was normal. Wall thickness was   increased in a pattern of mild LVH. There was focal basal   hypertrophy. Systolic function was mildly to moderately reduced.   The estimated ejection fraction was in the range of 40% to 45%.   Mild hypokinesis of the mid-apicalanteroseptal myocardium.   Features are consistent with a pseudonormal left ventricular   filling pattern, with concomitant abnormal relaxation and   increased filling pressure (grade 2 diastolic dysfunction).  Carotid Doppler 10/24/14: Normal study  Lexiscan Myoview 02/13/16:  The left ventricular ejection fraction is moderately decreased (30-44%). Asynchronous contractile performance  Nuclear stress EF: 39%.  There was no ST segment deviation noted during stress.  This is an intermediate risk study. No ischemia identified. Ejection fraction is reduced when compared to prior study in 2013 (47%).  There is a  rate related interventricular conduction delay, left bundle branch block.  Baseline ECG exhibits normal sinus rhythm.T-wave inversion 1, aVL, V1, V2 .   Recent Labs: 02/24/2018: TSH 1.55 08/28/2018: ALT 26; BUN 26; Creatinine, Ser 1.74; Hemoglobin 11.7; Platelets 215.0; Potassium 3.6; Sodium 139    Lipid Panel    Component Value Date/Time   CHOL 110 08/28/2018 1012   CHOL 164 07/20/2017 1202   TRIG 131.0 08/28/2018 1012   TRIG 200 04/20/2009   HDL 49.60 08/28/2018 1012   HDL 55 07/20/2017 1202   CHOLHDL 2 08/28/2018 1012   VLDL 26.2 08/28/2018 1012   LDLCALC 34 08/28/2018 1012   LDLCALC 82 07/20/2017 1202   LDLDIRECT 63.0 01/12/2016 0931      Wt Readings from Last 3 Encounters:  09/01/18 224 lb (101.6 kg)  08/28/18 224 lb 12.8 oz (102 kg)  07/14/18 221 lb (100.2 kg)      ASSESSMENT AND PLAN:  # CAD s/p PCI x2: Stable.  [name] hasn't experienced any anginal chest pain.  Continue plavix, metoprolol and atorvastatin.  Stress was negative for ischemia 01/2016.  Chest pain resolved.   # Chronic systolic and diastolic heart failure: [name] is euvolemic.  Very mild ankle edema that may be 2/2 amlodipine.  Continue metoprolol and losartan.    # Hypertension: BP is well-controlled on metoprolol, losartan, chlorthalidone, and amlodipine.  Ideally he would not be on amlodipine 2/2 chronic systolic and diastolic heart failure.  We will switch amlodipine to Bidil 1 tab bid.  He will track his BP at home and bring to follow up.  # Hyperlipidemia: LDL 34 08/2018.  Continue atorvastatin.   # Claudication: PT/PT pulses are diminished. We will get ABIs.       Current medicines are reviewed at length with the patient today.  The patient does not have concerns regarding medicines.  The following changes have been made: No changes  Labs/ tests ordered today include:    Orders Placed This Encounter  Procedures  . EKG 12-Lead     Disposition:   FU with Mike Pew C. Oval Linsey,  MD, [facility] in 1 month     Signed, Siona Coulston C. Oval Linsey, MD, [facility]  09/01/2018 12:56 PM    [place]

## 2018-09-01 NOTE — Patient Instructions (Addendum)
Medication Instructions:  STOP AMLODIPINE   START BIDIL TWICE A DAY   If you need a refill on your cardiac medications before your next appointment, please call your pharmacy.   Lab work: NONE  Testing/Procedures: Your physician has requested that you have an ankle brachial index (ABI). During this test an ultrasound and blood pressure cuff are used to evaluate the arteries that supply the arms and legs with blood. Allow thirty minutes for this exam. There are no restrictions or special instructions.  Follow-Up: At [facility], you and your health needs are our priority.  As part of our continuing mission to provide you with exceptional heart care, we have created designated Provider Care Teams.  These Care Teams include your primary Cardiologist (physician) and Advanced Practice Providers (APPs -  Physician Assistants and Nurse Practitioners) who all work together to provide you with the care you need, when you need it. You will need a follow up appointment in 4 weeks.  DR [facility] or one of the following Advanced Practice Providers on your designated Care Team:   Kerin Ransom, PA-C [place], [place] . Sande Rives, PA-C  Ankle-Brachial Index Test The ankle-brachial index (ABI) test is used to find peripheral vascular disease (PVD). PVD is also known as peripheral arterial disease (PAD). PVD is the blocking or hardening of the arteries anywhere within the circulatory system beyond the heart. PVD is caused by cholesterol deposits in your blood vessels (atherosclerosis). These deposits cause arteries to narrow. The delivery of oxygen to your tissues is impaired as a result. This can cause muscle pain and fatigue. This is called claudication. PVD means there may also be buildup of cholesterol in your:  Heart. This increases the risk of heart attacks.  Brain. This increases the risk of strokes.  The ankle-brachial index test measures the blood flow in your arms and legs. This test  also determines if blood vessels in your leg are narrowed by cholesterol deposits. There are additional causes of a reduced ankle-brachial index, such as inflammation of vessels or a clot in the vessels. However, these are much less common than narrowing due to cholesterol deposits. What is being tested? The test is done while you are lying down and resting. Measurements are taken of the systolic pressure:  In your arm (brachial).  In your ankle at several points along your leg.  Systolic pressure is the pressure inside your arteries when your heart pumps. The measurements are taken several times on both sides. Then, the highest systolic pressure of the ankle is divided by the highest brachial systolic pressure. The result is the ankle-brachial pressure ratio, or ABI. Sometimes this test is repeated after you have exercised on a treadmill for five minutes. You may have leg pain during the exercise portion of the test if you suffer from PAD. If the index number drops after exercise, this may show that PAD is present. A normal ABI ratio is between 0.9 and 1.4. A value below 0.9 is considered abnormal. This information is not intended to replace advice given to you by your health care provider. Make sure you discuss any questions you have with your health care provider. Document Released: 11/12/2004 Document Revised: 04/15/2016 Document Reviewed: 06/14/2014 Elsevier Interactive Patient Education  [employer].

## 2018-09-04 ENCOUNTER — Telehealth (HOSPITAL_COMMUNITY): Payer: Self-pay | Admitting: Cardiovascular Disease

## 2018-09-04 DIAGNOSIS — R9430 Abnormal result of cardiovascular function study, unspecified: Secondary | ICD-10-CM

## 2018-09-04 DIAGNOSIS — IMO0002 Reserved for concepts with insufficient information to code with codable children: Secondary | ICD-10-CM

## 2018-09-04 DIAGNOSIS — I10 Essential (primary) hypertension: Secondary | ICD-10-CM

## 2018-09-04 NOTE — Telephone Encounter (Signed)
New Message:      Minerva Fester with [facility] stated the medication isosorbide-hydrALAZINE (BIDIL) 20-37.5 MG tablet is being rejected and would like a provider a alternative   Contact:   [phone]

## 2018-09-04 NOTE — Telephone Encounter (Signed)
Created account for [employer] and submitted all info requested, non clinical.

## 2018-09-04 NOTE — Telephone Encounter (Signed)
Routed to [place] to adv

## 2018-09-05 NOTE — Telephone Encounter (Signed)
Just keep the amlodipine for now.  Lets repeat his echo given that it has been over 2 years to see what his LVEF is.  This will help determine the best BP medication for him.

## 2018-09-05 NOTE — Telephone Encounter (Signed)
Advised patient, verbalized understanding. Placed order in Epic and scheduled Echo

## 2018-09-07 ENCOUNTER — Telehealth: Payer: Self-pay | Admitting: Cardiovascular Disease

## 2018-09-07 NOTE — Telephone Encounter (Signed)
error 

## 2018-09-11 ENCOUNTER — Inpatient Hospital Stay (HOSPITAL_COMMUNITY): Admission: RE | Admit: 2018-09-11 | Payer: Medicare Other | Source: Ambulatory Visit

## 2018-09-11 ENCOUNTER — Ambulatory Visit (HOSPITAL_COMMUNITY)
Admission: RE | Admit: 2018-09-11 | Discharge: 2018-09-11 | Disposition: A | Discharge status: Home / Self Care | Payer: Medicare Other | Source: Ambulatory Visit | Attending: Cardiology | Admitting: Cardiology

## 2018-09-11 DIAGNOSIS — I739 Peripheral vascular disease, unspecified: Secondary | ICD-10-CM

## 2018-09-13 ENCOUNTER — Other Ambulatory Visit (HOSPITAL_COMMUNITY): Payer: Medicare Other

## 2018-09-13 DIAGNOSIS — R0989 Other specified symptoms and signs involving the circulatory and respiratory systems: Secondary | ICD-10-CM

## 2018-09-20 ENCOUNTER — Ambulatory Visit (INDEPENDENT_AMBULATORY_CARE_PROVIDER_SITE_OTHER): Payer: Medicare Other | Admitting: Endocrinology

## 2018-09-20 ENCOUNTER — Other Ambulatory Visit: Payer: Self-pay

## 2018-09-20 ENCOUNTER — Ambulatory Visit (HOSPITAL_COMMUNITY): Payer: Medicare Other | Attending: Cardiovascular Disease

## 2018-09-20 ENCOUNTER — Encounter: Payer: Self-pay | Admitting: Endocrinology

## 2018-09-20 VITALS — BP 130/78 | HR 89 | Ht 71.0 in | Wt 227.0 lb

## 2018-09-20 DIAGNOSIS — R9430 Abnormal result of cardiovascular function study, unspecified: Secondary | ICD-10-CM | POA: Insufficient documentation

## 2018-09-20 DIAGNOSIS — I2510 Atherosclerotic heart disease of native coronary artery without angina pectoris: Secondary | ICD-10-CM | POA: Diagnosis not present

## 2018-09-20 DIAGNOSIS — E1140 Type 2 diabetes mellitus with diabetic neuropathy, unspecified: Secondary | ICD-10-CM

## 2018-09-20 DIAGNOSIS — E1165 Type 2 diabetes mellitus with hyperglycemia: Secondary | ICD-10-CM

## 2018-09-20 DIAGNOSIS — I10 Essential (primary) hypertension: Secondary | ICD-10-CM | POA: Diagnosis not present

## 2018-09-20 DIAGNOSIS — IMO0002 Reserved for concepts with insufficient information to code with codable children: Secondary | ICD-10-CM

## 2018-09-20 LAB — ECHOCARDIOGRAM COMPLETE
Height: 71 in
Weight: 3632 oz

## 2018-09-20 MED ORDER — GLUCOSE BLOOD VI STRP: ORAL_STRIP | 14 refills | Status: DC

## 2018-09-20 NOTE — Progress Notes (Signed)
Subjective:    Patient ID: [name], male    DOB: [birth year], 67 y.o.   MRN: [id number]  HPI Pt returns for f/u of diabetes mellitus: DM type: Insulin-requiring type 2 Dx'ed: 3086 Complications: polyneuropathy, CAD, renal insufficiency, retinopathy, and TIA.   Therapy: insulin since 2011 DKA: never.  Severe hypoglycemia: never.  Pancreatitis: never.  Other: He says he cannot use syringe and vial, due to tremor; he changed lantus to NPH, then 70/30, then 50/50, due to pattern on cbg's; he declines multiple daily injections.  Interval history: Pt says he seldom misses the insulin.  no cbg record, but states cbg's vary from 70-180.  There is no trend throughout the day.   Past Medical History:  Diagnosis Date  . ALLERGIC RHINITIS   . CAD, NATIVE VESSEL    BMS to OM1 2001, DES to BMS 2005  . Chronic back pain    herniated disc  . Chronic combined systolic and diastolic heart failure ([place]) 06/29/2016  . Constipation    takes Carafate four times day  . DIAB W/UNSPEC COMP TYPE II/UNSPEC TYPE UNCNTRL   . ERECTILE DYSFUNCTION   . GERD   . HYPERLIPIDEMIA-MIXED   . HYPERTENSION, BENIGN   . LBBB (left bundle branch block) 02/02/2016  . MORTON'S NEUROMA, RIGHT   . Peripheral neuropathy   . Seasonal allergies    takes Allegra and Benadryl daily prn;uses Flonase daily  . SHOULDER PAIN, RIGHT   . 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 TIA on medication 02/2012    Past Surgical History:  Procedure Laterality Date  . ANGIOPLASTY    . COLONOSCOPY    . CORONARY ANGIOPLASTY  2005   2 stents  . DENTAL SURGERY    . LARYNGOPLASTY  08/07/2012   Procedure: LARYNGOPLASTY;  Surgeon: Izora Gala, MD;  Location: [facility];  Service: ENT;  Laterality: Left;  Left Vocal Cord Medialyzation  . left knee surgury     x 2   . stent  2001, 2004   coronary stents    Social History   Socioeconomic History  . Marital status: Legally Separated    Spouse name: Not on  file  . Number of children: 3  . Years of education: college  . Highest education level: Not on file  Occupational History  . Occupation: disabled    Employer: [employer]  Social Needs  . Financial resource strain: Not very hard  . Food insecurity:    Worry: Never true    Inability: Never true  . Transportation needs:    Medical: No    Non-medical: No  Tobacco Use  . Smoking status: Former Smoker    Last attempt to quit: 02/24/1983    Years since quitting: 35.6  . Smokeless tobacco: Never Used  Substance and Sexual Activity  . Alcohol use: No    Alcohol/week: 0.0 standard drinks  . Drug use: No  . Sexual activity: Never  Lifestyle  . Physical activity:    Days per week: 2 days    Minutes per session: 50 min  . Stress: To some extent  Relationships  . Social connections:    Talks on phone: More than three times a week    Gets together: More than three times a week    Attends religious service: More than 4 times per year    Active member of club or organization: Yes    Attends meetings of clubs or organizations: More  than 4 times per year    Relationship status: Separated  . Intimate partner violence:    Fear of current or ex partner: Not on file    Emotionally abused: Not on file    Physically abused: Not on file    Forced sexual activity: Not on file  Other Topics Concern  . Not on file  Social History Narrative       Current Outpatient Medications on File Prior to Visit  Medication Sig Dispense Refill  . amLODipine (NORVASC) 10 MG tablet Take 10 mg by mouth daily.    Marland Kitchen atorvastatin (LIPITOR) 40 MG tablet Take 1 tablet (40 mg total) by mouth daily. NEED OV. 90 tablet 0  . cetirizine (ZYRTEC) 10 MG tablet Take 10 mg by mouth as needed.    . chlorthalidone (HYGROTON) 25 MG tablet TAKE 1 TABLET BY MOUTH ONCE DAILY 90 tablet 1  . clopidogrel (PLAVIX) 75 MG tablet Take 1 tablet (75 mg total) by mouth daily. 90 tablet 1  . esomeprazole (NEXIUM) 40 MG capsule Take 1  capsule (40 mg total) by mouth daily. NEED OV. 90 capsule 0  . fluticasone (FLONASE) 50 MCG/ACT nasal spray Place 2 sprays into both nostrils daily. 16 g 0  . Insulin Lispro Prot & Lispro (HUMALOG MIX 50/50 KWIKPEN) (50-50) 100 UNIT/ML Kwikpen Inject 45 Units into the skin daily with breakfast. And pen needles 1/day 30 mL 11  . losartan (COZAAR) 100 MG tablet TAKE 1 TABLET BY MOUTH ONCE DAILY 90 tablet 1  . metoprolol succinate (TOPROL-XL) 50 MG 24 hr tablet TAKE 1 TABLET BY MOUTH ONCE DAILY .  TAKE  WITH  OR  IMMEDIATELLY  FOLLOWING  A  MEAL 90 tablet 1  . NON FORMULARY vasinox cardiovascular suppor    . PRODIGY TWIST TOP LANCETS 28G MISC USE   TO CHECK GLUCOSE 4 TIMES DAILY 400 each 1  . RELION PEN NEEDLES 32G X 4 MM MISC USE 1 ONCE DAILY AS DIRECTED  11  . senna-docusate (SENOKOT-S) 8.6-50 MG per tablet Take 2 tablets by mouth 2 (two) times daily. For constipation.     No current facility-administered medications on file prior to visit.     Allergies  Allergen Reactions  . Penicillins Anaphylaxis  . Pneumococcal Vaccines Anaphylaxis  . Shellfish Allergy Anaphylaxis  . Influenza Vaccines   . Lisinopril     cough  . Topiramate Other (See Comments)    Chest spasms and numbness    Family History  Problem Relation Age of Onset  . Cancer Mother     BP 130/78 (BP Location: Left Arm, Patient Position: Sitting, Cuff Size: Large)   Pulse 89   Ht 5\' 11"  (1.803 m)   Wt 227 lb (103 kg)   SpO2 95%   BMI 31.66 kg/m    Review of Systems He denies hypoglycemia.      Objective:   Physical Exam VITAL SIGNS:  See vs page GENERAL: no distress Pulses: foot pulses are intact bilaterally.   MSK: no deformity of the feet or ankles.  CV: no edema of the legs or ankles Skin:  no ulcer on the feet or ankles.  normal color and temp on the feet and ankles Neuro: sensation is intact to touch on the feet and ankles.      Lab Results  Component Value Date   HGBA1C 7.7 (H) 08/28/2018        Assessment & Plan:  Insulin-requiring type 2 DM, with TIA: this is  the best control this pt should aim for, given this regimen, which does match insulin to his changing needs throughout the day Renal insuff: he needs a fast-acting qd insulin Noncompliance with insulin: improved  Patient Instructions  Please continue the same insulin.   On this type of insulin schedule, you should eat meals on a regular schedule (especially breakfast and lunch).  If a meal is missed or significantly delayed, your blood sugar could go low.   To help you remember it, put the pen next to your breakfast.  It does not have to be refrigerated.   Please come back for a follow-up appointment in 3 months.  check your blood sugar twice a day.  vary the time of day when you check, between before the 3 meals, and at bedtime.  also check if you have symptoms of your blood sugar being too high or too low.  please keep a record of the readings and bring it to your next appointment here (or you can bring the meter itself).  You can write it on any piece of paper.  please call us sooner if your blood sugar goes below 70, or if you have a lot of readings over 200.

## 2018-09-20 NOTE — Patient Instructions (Signed)
Please continue the same insulin.   On this type of insulin schedule, you should eat meals on a regular schedule (especially breakfast and lunch).  If a meal is missed or significantly delayed, your blood sugar could go low.   To help you remember it, put the pen next to your breakfast.  It does not have to be refrigerated.   Please come back for a follow-up appointment in 3 months.  check your blood sugar twice a day.  vary the time of day when you check, between before the 3 meals, and at bedtime.  also check if you have symptoms of your blood sugar being too high or too low.  please keep a record of the readings and bring it to your next appointment here (or you can bring the meter itself).  You can write it on any piece of paper.  please call us sooner if your blood sugar goes below 70, or if you have a lot of readings over 200.

## 2018-10-23 ENCOUNTER — Other Ambulatory Visit: Payer: Self-pay | Admitting: Cardiovascular Disease

## 2018-10-26 ENCOUNTER — Encounter: Payer: Self-pay | Admitting: Cardiology

## 2018-10-26 ENCOUNTER — Ambulatory Visit (INDEPENDENT_AMBULATORY_CARE_PROVIDER_SITE_OTHER): Payer: Medicare Other | Admitting: Cardiology

## 2018-10-26 VITALS — BP 124/74 | HR 89 | Ht 71.0 in | Wt 227.6 lb

## 2018-10-26 DIAGNOSIS — R0600 Dyspnea, unspecified: Secondary | ICD-10-CM | POA: Diagnosis not present

## 2018-10-26 DIAGNOSIS — Z8673 Personal history of transient ischemic attack (TIA), and cerebral infarction without residual deficits: Secondary | ICD-10-CM | POA: Insufficient documentation

## 2018-10-26 DIAGNOSIS — R600 Localized edema: Secondary | ICD-10-CM

## 2018-10-26 DIAGNOSIS — I2510 Atherosclerotic heart disease of native coronary artery without angina pectoris: Secondary | ICD-10-CM

## 2018-10-26 DIAGNOSIS — IMO0002 Reserved for concepts with insufficient information to code with codable children: Secondary | ICD-10-CM

## 2018-10-26 DIAGNOSIS — Z9861 Coronary angioplasty status: Secondary | ICD-10-CM

## 2018-10-26 DIAGNOSIS — I5042 Chronic combined systolic (congestive) and diastolic (congestive) heart failure: Secondary | ICD-10-CM

## 2018-10-26 DIAGNOSIS — N183 Chronic kidney disease, stage 3 unspecified: Secondary | ICD-10-CM

## 2018-10-26 DIAGNOSIS — R0609 Other forms of dyspnea: Secondary | ICD-10-CM | POA: Insufficient documentation

## 2018-10-26 DIAGNOSIS — E1165 Type 2 diabetes mellitus with hyperglycemia: Secondary | ICD-10-CM

## 2018-10-26 DIAGNOSIS — E1140 Type 2 diabetes mellitus with diabetic neuropathy, unspecified: Secondary | ICD-10-CM

## 2018-10-26 DIAGNOSIS — I10 Essential (primary) hypertension: Secondary | ICD-10-CM

## 2018-10-26 MED ORDER — CARVEDILOL 12.5 MG PO TABS: 12.5000 mg | ORAL_TABLET | Freq: Two times a day (BID) | ORAL | 3 refills | Status: DC

## 2018-10-26 NOTE — Patient Instructions (Signed)
Medication Instructions:   STOP TAKING AMLODIPINE 10 MG  AND TOPROL 50 MG   TART TAKING COREG 12.5 MG TWICE DAY     If you need a refill on your cardiac medications before your next appointment, please call your pharmacy.   Lab work:  If you have labs (blood work) drawn today and your tests are completely normal, you will receive your results only by: Marland Kitchen MyChart Message (if you have MyChart) OR . A paper copy in the mail If you have any lab test that is abnormal or we need to change your treatment, we will call you to review the results.  Testing/Procedures: Your physician has requested that you have a lexiscan myoview. For further information please  visit [email or web address]. Please follow instruction sheet, as given.  Follow-Up: 2 WEEKS POST LEXISCAN WITH KLIROY    Any Other Special Instructions Will Be Listed Below (If Applicable).

## 2018-10-26 NOTE — Assessment & Plan Note (Signed)
Echo 09/20/18 EF 74-16%, grade 2 diastolic dys

## 2018-10-26 NOTE — Assessment & Plan Note (Signed)
Currently controlled.  He does have grade 2 diastolic dysfunction on echo

## 2018-10-26 NOTE — Assessment & Plan Note (Signed)
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

## 2018-10-26 NOTE — Progress Notes (Signed)
10/26/2018 [name]   06-Nov-1951  [id number]  Primary Physician [name], Mike Lick, MD Primary Cardiologist: Dr [name]  HPI: This is an office note on [facility].  [name] is a pleasant 67 year old African-American male who was followed by our group a few years ago.  Patient has a history of coronary disease, he had an OM1 bare-metal stent in 2001 in [place], he then had in-stent restenosis and received an OM1 drug-eluting stent in 2005.  In 2013 he had a right brain stroke.  Myoview at that time was low risk and his ejection fraction was preserved by echo.  We did not see him again until 2017 when he was referred for a new left bundle branch block.  Patient was evaluated by Dr. Oval Walker and he underwent nuclear stress testing.  This was read as an intermediate study secondary to LV dysfunction with an EF of 39% but there was no ischemia noted.  He had an echo in April 2017 that showed his EF to be 40 to 45%.  He was seen in the office September 01, 2018 by Dr. Oval Walker.  At that time he was doing pretty well.  He does have some lower extremity edema that Dr. Oval Walker suspected may be from amlodipine.  She wanted to switch him to BiDil but apparently that could not be approved through his insurance.  He did have an echocardiogram done September 20, 2018 that showed his LV function was about the same, 40 to 45%.  He seen in the office by me today for follow-up.  He continues to complain of lower extremity edema although this was not dramatic on exam.  He says it is worse in the evening.  He does not have any obvious varicosities.  The patient's concerned it may be from the amlodipine and he may in fact be right.  He does not appear to have congestive heart failure.  He does admit to exertional dyspnea and chest tightness when he walks his grandson to school.  They have to go up a hill and he feels like he gets pretty short of breath doing this.  He thinks this is new.  He has not had  to use nitroglycerin.  He does say occasionally he will break out into cold sweat even if it is cool outside.   Current Outpatient Medications  Medication Sig Dispense Refill  . atorvastatin (LIPITOR) 40 MG tablet TAKE 1 TABLET BY MOUTH ONCE DAILY .  MUST  HAVE  OFFICE  VISIT  BEFORE  FURTHER  REFILLS. 90 tablet 2  . cetirizine (ZYRTEC) 10 MG tablet Take 10 mg by mouth as needed.    . chlorthalidone (HYGROTON) 25 MG tablet TAKE 1 TABLET BY MOUTH ONCE DAILY 90 tablet 1  . clopidogrel (PLAVIX) 75 MG tablet Take 1 tablet (75 mg total) by mouth daily. 90 tablet 1  . esomeprazole (NEXIUM) 40 MG capsule TAKE 1 CAPSULE BY MOUTH ONCE DAILY .  MUST  HAVE  OFFICE  VISIT  BEFORE  FURTHER  REFILLS. 90 capsule 2  . fluticasone (FLONASE) 50 MCG/ACT nasal spray Place 2 sprays into both nostrils daily. 16 g 0  . glucose blood (PRODIGY NO CODING BLOOD GLUC) test strip USE ONE STRIP TO CHECK GLUCOSE 4 TIMES DAILY 400 each 14  . Insulin Lispro Prot & Lispro (HUMALOG MIX 50/50 KWIKPEN) (50-50) 100 UNIT/ML Kwikpen Inject 45 Units into the skin daily with breakfast. And pen needles 1/day 30 mL 11  . losartan (  COZAAR) 100 MG tablet TAKE 1 TABLET BY MOUTH ONCE DAILY 90 tablet 1  . NON FORMULARY vasinox cardiovascular suppor    . PRODIGY TWIST TOP LANCETS 28G MISC USE   TO CHECK GLUCOSE 4 TIMES DAILY 400 each 1  . RELION PEN NEEDLES 32G X 4 MM MISC USE 1 ONCE DAILY AS DIRECTED  11  . senna-docusate (SENOKOT-S) 8.6-50 MG per tablet Take 2 tablets by mouth 2 (two) times daily. For constipation.    . carvedilol (COREG) 12.5 MG tablet Take 1 tablet (12.5 mg total) by mouth 2 (two) times daily. 180 tablet 3   No current facility-administered medications for this visit.     Allergies  Allergen Reactions  . Penicillins Anaphylaxis  . Pneumococcal Vaccines Anaphylaxis  . Shellfish Allergy Anaphylaxis  . Influenza Vaccines   . Lisinopril     cough  . Topiramate Other (See Comments)    Chest spasms and numbness     Past Medical History:  Diagnosis Date  . ALLERGIC RHINITIS   . CAD, NATIVE VESSEL    BMS to OM1 2001, DES to BMS 2005  . Chronic back pain    herniated disc  . Chronic combined systolic and diastolic heart failure ([place]) 06/29/2016  . Constipation    takes Carafate four times day  . DIAB W/UNSPEC COMP TYPE II/UNSPEC TYPE UNCNTRL   . ERECTILE DYSFUNCTION   . GERD   . HYPERLIPIDEMIA-MIXED   . HYPERTENSION, BENIGN   . LBBB (left bundle branch block) 02/02/2016  . MORTON'S NEUROMA, RIGHT   . Peripheral neuropathy   . Seasonal allergies    takes Allegra and Benadryl daily prn;uses Flonase daily  . SHOULDER PAIN, RIGHT   . 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 TIA on medication 02/2012    Social History   Socioeconomic History  . Marital status: Legally Separated    Spouse name: Not on file  . Number of children: 3  . Years of education: college  . Highest education level: Not on file  Occupational History  . Occupation: disabled    Employer: [employer]  Social Needs  . Financial resource strain: Not very hard  . Food insecurity:    Worry: Never true    Inability: Never true  . Transportation needs:    Medical: No    Non-medical: No  Tobacco Use  . Smoking status: Former Smoker    Last attempt to quit: 02/24/1983    Years since quitting: 35.6  . Smokeless tobacco: Never Used  Substance and Sexual Activity  . Alcohol use: No    Alcohol/week: 0.0 standard drinks  . Drug use: No  . Sexual activity: Never  Lifestyle  . Physical activity:    Days per week: 2 days    Minutes per session: 50 min  . Stress: To some extent  Relationships  . Social connections:    Talks on phone: More than three times a week    Gets together: More than three times a week    Attends religious service: More than 4 times per year    Active member of club or organization: Yes    Attends meetings of clubs or organizations: More than 4 times per year     Relationship status: Separated  . Intimate partner violence:    Fear of current or ex partner: Not on file    Emotionally abused: Not on file    Physically abused: Not on file  Forced sexual activity: Not on file  Other Topics Concern  . Not on file  Social History Narrative        Family History  Problem Relation Age of Onset  . Cancer Mother      Review of Systems: General: negative for chills, fever, night sweats or weight changes.  Cardiovascular: negative for orthopnea, palpitations, paroxysmal nocturnal dyspnea  Dermatological: negative for rash Respiratory: negative for cough or wheezing Urologic: negative for hematuria Abdominal: negative for nausea, vomiting, diarrhea, bright red blood per rectum, melena, or hematemesis Neurologic: negative for visual changes, syncope, or dizziness All other systems reviewed and are otherwise negative except as noted above.    Blood pressure 124/74, pulse 89, height 5\' 11"  (1.803 m), weight 227 lb 9.6 oz (103.2 kg).  General appearance: alert, cooperative and no distress Neck: no carotid bruit and no JVD Lungs: clear to auscultation bilaterally Heart: regular rate and rhythm Extremities: trace edema Skin: warm and dry Neurologic: Grossly normal   ASSESSMENT AND PLAN:   Edema leg Probably secondary to Amlodipine  Chronic combined systolic and diastolic heart failure (HCC) Echo 09/20/18 EF 08-81%, grade 2 diastolic dys  CAD S/P percutaneous coronary angioplasty OM1 bare-metal stent in 2001 in [place] with in-stent restenosis treated with DES in 2005 Myoview March 2017 was intermediate secondary to LV dysfunction but there was no ischemia  CRI (chronic renal insufficiency), stage 3 (moderate) ([place]) .  Essential hypertension Currently controlled.  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  DOE (dyspnea on exertion) This is a recent onset symptoms over the last few weeks concerning for anginal equivalent   PLAN we will stop amlodipine.  I will try him on carvedilol 12.5 mg twice daily and stop his Toprol.  He may get better blood pressure control with this.  I also like to get a [employer].  I will see him back in a couple weeks and follow-up.  Kerin Ransom PA-C 10/26/2018 10:35 AM

## 2018-10-26 NOTE — Assessment & Plan Note (Signed)
This is a recent onset symptoms over the last few weeks concerning for anginal equivalent

## 2018-10-26 NOTE — Assessment & Plan Note (Signed)
Probably secondary to Amlodipine

## 2018-10-26 NOTE — Assessment & Plan Note (Signed)
OM1 bare-metal stent in 2001 in [place] with in-stent restenosis treated with DES in 2005 Myoview March 2017 was intermediate secondary to LV dysfunction but there was no ischemia

## 2018-11-01 ENCOUNTER — Telehealth (HOSPITAL_COMMUNITY): Payer: Self-pay

## 2018-11-01 NOTE — Telephone Encounter (Signed)
Encounter complete. 

## 2018-11-03 ENCOUNTER — Ambulatory Visit (HOSPITAL_COMMUNITY)
Admission: RE | Admit: 2018-11-03 | Discharge: 2018-11-03 | Disposition: A | Discharge status: Home / Self Care | Payer: Medicare Other | Source: Ambulatory Visit | Attending: Cardiology | Admitting: Cardiology

## 2018-11-03 DIAGNOSIS — R0600 Dyspnea, unspecified: Secondary | ICD-10-CM | POA: Diagnosis present

## 2018-11-03 LAB — MYOCARDIAL PERFUSION IMAGING
LV dias vol: 118 mL (ref 62–150)
LV sys vol: 67 mL
Peak HR: 111 {beats}/min
Rest HR: 71 {beats}/min
SDS: 2
SRS: 5
SSS: 7
TID: 0.93

## 2018-11-03 MED ORDER — TECHNETIUM TC 99M TETROFOSMIN IV KIT
10.2000 | PACK | Freq: Once | INTRAVENOUS | Status: AC | PRN
  Administered 2018-11-03: 10.2 via INTRAVENOUS
  Filled 2018-11-03: qty 11

## 2018-11-03 MED ORDER — REGADENOSON 0.4 MG/5ML IV SOLN
0.4000 mg | Freq: Once | INTRAVENOUS | Status: AC
  Administered 2018-11-03: 0.4 mg via INTRAVENOUS

## 2018-11-03 MED ORDER — TECHNETIUM TC 99M TETROFOSMIN IV KIT
29.8000 | PACK | Freq: Once | INTRAVENOUS | Status: AC | PRN
  Administered 2018-11-03: 29.8 via INTRAVENOUS
  Filled 2018-11-03: qty 30

## 2018-11-08 ENCOUNTER — Encounter (INDEPENDENT_AMBULATORY_CARE_PROVIDER_SITE_OTHER): Payer: Medicare Other | Admitting: Ophthalmology

## 2018-11-08 DIAGNOSIS — E113293 Type 2 diabetes mellitus with mild nonproliferative diabetic retinopathy without macular edema, bilateral: Secondary | ICD-10-CM | POA: Diagnosis not present

## 2018-11-08 DIAGNOSIS — I10 Essential (primary) hypertension: Secondary | ICD-10-CM

## 2018-11-08 DIAGNOSIS — E11319 Type 2 diabetes mellitus with unspecified diabetic retinopathy without macular edema: Secondary | ICD-10-CM

## 2018-11-08 DIAGNOSIS — H35033 Hypertensive retinopathy, bilateral: Secondary | ICD-10-CM | POA: Diagnosis not present

## 2018-11-08 DIAGNOSIS — H2513 Age-related nuclear cataract, bilateral: Secondary | ICD-10-CM

## 2018-11-08 DIAGNOSIS — H43813 Vitreous degeneration, bilateral: Secondary | ICD-10-CM

## 2018-11-08 DIAGNOSIS — H353121 Nonexudative age-related macular degeneration, left eye, early dry stage: Secondary | ICD-10-CM

## 2018-11-08 DIAGNOSIS — H35371 Puckering of macula, right eye: Secondary | ICD-10-CM

## 2018-11-20 ENCOUNTER — Encounter: Payer: Self-pay | Admitting: Cardiology

## 2018-11-20 ENCOUNTER — Ambulatory Visit (INDEPENDENT_AMBULATORY_CARE_PROVIDER_SITE_OTHER): Payer: Medicare Other | Admitting: Cardiology

## 2018-11-20 DIAGNOSIS — E119 Type 2 diabetes mellitus without complications: Secondary | ICD-10-CM

## 2018-11-20 DIAGNOSIS — N183 Chronic kidney disease, stage 3 unspecified: Secondary | ICD-10-CM

## 2018-11-20 DIAGNOSIS — Z9861 Coronary angioplasty status: Secondary | ICD-10-CM

## 2018-11-20 DIAGNOSIS — I2510 Atherosclerotic heart disease of native coronary artery without angina pectoris: Secondary | ICD-10-CM

## 2018-11-20 DIAGNOSIS — Z794 Long term (current) use of insulin: Secondary | ICD-10-CM

## 2018-11-20 DIAGNOSIS — I5042 Chronic combined systolic (congestive) and diastolic (congestive) heart failure: Secondary | ICD-10-CM | POA: Diagnosis not present

## 2018-11-20 DIAGNOSIS — I10 Essential (primary) hypertension: Secondary | ICD-10-CM

## 2018-11-20 DIAGNOSIS — I6330 Cerebral infarction due to thrombosis of unspecified cerebral artery: Secondary | ICD-10-CM | POA: Diagnosis not present

## 2018-11-20 DIAGNOSIS — IMO0001 Reserved for inherently not codable concepts without codable children: Secondary | ICD-10-CM

## 2018-11-20 DIAGNOSIS — I447 Left bundle-branch block, unspecified: Secondary | ICD-10-CM

## 2018-11-20 MED ORDER — CARVEDILOL 12.5 MG PO TABS: 18.7500 mg | ORAL_TABLET | Freq: Two times a day (BID) | ORAL | 1 refills | Status: DC

## 2018-11-20 NOTE — Assessment & Plan Note (Signed)
Echo 09/20/18 EF 40-45%, grade 1 DD, severe LVH

## 2018-11-20 NOTE — Assessment & Plan Note (Signed)
C/O LE edema on Amlodipine B/P improved on Coreg- will up titrate dose

## 2018-11-20 NOTE — Assessment & Plan Note (Signed)
OM1 bare-metal stent in 2001 in [place] with in-stent restenosis treated with DES in 2005 Myoview Dec 2019 was intermediate secondary to LV dysfunction but there was no ischemia (no change from 2017)

## 2018-11-20 NOTE — Assessment & Plan Note (Signed)
Last SCr 1.74 Oct 2019- GFR 50

## 2018-11-20 NOTE — Patient Instructions (Signed)
Medication Instructions:  Increase Carvedilol to 1 and 1/2 tablets (18.75 mg) twice daily.  If you need a refill on your cardiac medications before your next appointment, please call your pharmacy.   Follow-Up: At [facility], you and your health needs are our priority.  As part of our continuing mission to provide you with exceptional heart care, we have created designated Provider Care Teams.  These Care Teams include your primary Cardiologist (physician) and Advanced Practice Providers (APPs -  Physician Assistants and Nurse Practitioners) who all work together to provide you with the care you need, when you need it. You will need a follow up appointment in 3 months with Dr. Oval Linsey or one of the following Advanced Practice Providers on your designated Care Team:   Kerin Ransom, PA-C Roby Lofts, [place] . Sande Rives, PA-C  Any Other Special Instructions Will Be Listed Below (If Applicable). None

## 2018-11-20 NOTE — Assessment & Plan Note (Signed)
Chronic. 

## 2018-11-20 NOTE — Assessment & Plan Note (Signed)
right thalamic/posterior limb internal capsule infarct with spastic residual left hemiparesis in September 2013 with extension in November 2013

## 2018-11-20 NOTE — Progress Notes (Signed)
11/20/2018 [name]   October 09, 1951  [id number]  Primary Physician Quay Burow, Claudina Lick, MD Primary Cardiologist: Dr Oval Linsey  HPI:  The patient is a pleasant 67 year old African-American male with a history of coronary disease and prior CVA.  He had an OM1 bare-metal stent in 2001 in [place], he then had in-stent restenosis and received an OM1 DES in 2005.  In 2013 he had a right brain stroke.  Myoview at that time was low risk and his ejection fraction was preserved by echo.  We did not see him again until 2017 when he was referred for a new left bundle branch block. Nuclear stress testing March 2017 was read as an intermediate study secondary to LV dysfunction with an EF of 39% but there was no ischemia noted.  He had an echo in April 2017 that showed his EF to be 40 to 45%.  He then saw Dr Oval Linsey in Oct and had some problem with edema felt to be secondary to Amlodipine.  Echo 09/20/18 showed an EF of 40-45% with severe LVH and grade 1 DD.  I saw him in f/u 10/26/18 and ended up stopping his Amlodipine (edema) and changing his Metoprolol to Coreg for better B/P control.  The pt also complained of chest pain and a Myoview was done 11/03/18 and this was unchanged from his 2017 study with a LBBB defect and EF of 43%.  He is in the office today for follow up.  He has done well since his LOV.  His edema has resolved aff the Amlodipine.  His B/P at is running 140/80.  He has not taken his medications yet today- B/P by me 144/80,  HR 80.    Current Outpatient Medications  Medication Sig Dispense Refill  . atorvastatin (LIPITOR) 40 MG tablet TAKE 1 TABLET BY MOUTH ONCE DAILY .  MUST  HAVE  OFFICE  VISIT  BEFORE  FURTHER  REFILLS. 90 tablet 2  . carvedilol (COREG) 12.5 MG tablet Take 1.5 tablets (18.75 mg total) by mouth 2 (two) times daily. 270 tablet 1  . cetirizine (ZYRTEC) 10 MG tablet Take 10 mg by mouth as needed.    . chlorthalidone (HYGROTON) 25 MG tablet TAKE 1 TABLET BY MOUTH ONCE  DAILY 90 tablet 1  . clopidogrel (PLAVIX) 75 MG tablet Take 1 tablet (75 mg total) by mouth daily. 90 tablet 1  . esomeprazole (NEXIUM) 40 MG capsule TAKE 1 CAPSULE BY MOUTH ONCE DAILY .  MUST  HAVE  OFFICE  VISIT  BEFORE  FURTHER  REFILLS. 90 capsule 2  . fluticasone (FLONASE) 50 MCG/ACT nasal spray Place 2 sprays into both nostrils daily. 16 g 0  . glucose blood (PRODIGY NO CODING BLOOD GLUC) test strip USE ONE STRIP TO CHECK GLUCOSE 4 TIMES DAILY 400 each 14  . Insulin Lispro Prot & Lispro (HUMALOG MIX 50/50 KWIKPEN) (50-50) 100 UNIT/ML Kwikpen Inject 45 Units into the skin daily with breakfast. And pen needles 1/day 30 mL 11  . losartan (COZAAR) 100 MG tablet TAKE 1 TABLET BY MOUTH ONCE DAILY 90 tablet 1  . NON FORMULARY vasinox cardiovascular suppor    . PRODIGY TWIST TOP LANCETS 28G MISC USE   TO CHECK GLUCOSE 4 TIMES DAILY 400 each 1  . RELION PEN NEEDLES 32G X 4 MM MISC USE 1 ONCE DAILY AS DIRECTED  11  . senna-docusate (SENOKOT-S) 8.6-50 MG per tablet Take 2 tablets by mouth 2 (two) times daily. For constipation.  No current facility-administered medications for this visit.     Allergies  Allergen Reactions  . Penicillins Anaphylaxis  . Pneumococcal Vaccines Anaphylaxis  . Shellfish Allergy Anaphylaxis  . Influenza Vaccines   . Lisinopril     cough  . Topiramate Other (See Comments)    Chest spasms and numbness  . Amlodipine Other (See Comments)    LE edema    Past Medical History:  Diagnosis Date  . ALLERGIC RHINITIS   . CAD, NATIVE VESSEL    BMS to OM1 2001, DES to BMS 2005  . Chronic back pain    herniated disc  . Chronic combined systolic and diastolic heart failure ([place]) 06/29/2016  . Constipation    takes Carafate four times day  . DIAB W/UNSPEC COMP TYPE II/UNSPEC TYPE UNCNTRL   . ERECTILE DYSFUNCTION   . GERD   . HYPERLIPIDEMIA-MIXED   . HYPERTENSION, BENIGN   . LBBB (left bundle branch block) 02/02/2016  . MORTON'S NEUROMA, RIGHT   . Peripheral  neuropathy   . Seasonal allergies    takes Allegra and Benadryl daily prn;uses Flonase daily  . SHOULDER PAIN, RIGHT   . 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 TIA on medication 02/2012    Social History   Socioeconomic History  . Marital status: Legally Separated    Spouse name: Not on file  . Number of children: 3  . Years of education: college  . Highest education level: Not on file  Occupational History  . Occupation: disabled    Employer: [employer]  Social Needs  . Financial resource strain: Not very hard  . Food insecurity:    Worry: Never true    Inability: Never true  . Transportation needs:    Medical: No    Non-medical: No  Tobacco Use  . Smoking status: Former Smoker    Last attempt to quit: 02/24/1983    Years since quitting: 35.7  . Smokeless tobacco: Never Used  Substance and Sexual Activity  . Alcohol use: No    Alcohol/week: 0.0 standard drinks  . Drug use: No  . Sexual activity: Never  Lifestyle  . Physical activity:    Days per week: 2 days    Minutes per session: 50 min  . Stress: To some extent  Relationships  . Social connections:    Talks on phone: More than three times a week    Gets together: More than three times a week    Attends religious service: More than 4 times per year    Active member of club or organization: Yes    Attends meetings of clubs or organizations: More than 4 times per year    Relationship status: Separated  . Intimate partner violence:    Fear of current or ex partner: Not on file    Emotionally abused: Not on file    Physically abused: Not on file    Forced sexual activity: Not on file  Other Topics Concern  . Not on file  Social History Narrative        Family History  Problem Relation Age of Onset  . Cancer Mother      Review of Systems: General: negative for chills, fever, night sweats or weight changes.  Cardiovascular: negative for chest pain, dyspnea on exertion, edema,  orthopnea, palpitations, paroxysmal nocturnal dyspnea or shortness of breath Dermatological: negative for rash Respiratory: negative for cough or wheezing Urologic: negative for hematuria Abdominal: negative for  nausea, vomiting, diarrhea, bright red blood per rectum, melena, or hematemesis Neurologic: negative for visual changes, syncope, or dizziness All other systems reviewed and are otherwise negative except as noted above.    Blood pressure (!) 162/72, pulse 78, height 5\' 11"  (1.803 m), weight 228 lb (103.4 kg), SpO2 99 %.  General appearance: alert, cooperative and no distress Lungs: clear to auscultation bilaterally Heart: regular rate and rhythm and extra systole noted Extremities: no edema Skin: warm and dry Neurologic: Grossly normal, he has significant weakness, lt side    ASSESSMENT AND PLAN:   Essential hypertension C/O LE edema on Amlodipine B/P improved on Coreg- will up titrate dose  CAD S/P percutaneous coronary angioplasty OM1 bare-metal stent in 2001 in [place] with in-stent restenosis treated with DES in 2005 Myoview Dec 2019 was intermediate secondary to LV dysfunction but there was no ischemia (no change from 2017)  Chronic combined systolic and diastolic heart failure ([place]) Echo 09/20/18 EF 40-45%, grade 1 DD, severe LVH  Stroke ([place]) right thalamic/posterior limb internal capsule infarct with spastic residual left hemiparesis in September 2013 with extension in November 2013  Insulin dependent diabetes mellitus ([place]) Dr Loanne Drilling  LBBB (left bundle branch block) Chronic  CRI (chronic renal insufficiency), stage 3 (moderate) (HCC) Last SCr 1.74 Oct 2019- GFR 50   PLAN  Increase coreg to 18.75 mg BID- f/u with dr Oval Linsey in 3 months.   Kerin Ransom PA-C 11/20/2018 9:29 AM

## 2018-11-20 NOTE — Assessment & Plan Note (Signed)
Dr Loanne Drilling

## 2018-12-01 ENCOUNTER — Ambulatory Visit (INDEPENDENT_AMBULATORY_CARE_PROVIDER_SITE_OTHER): Payer: Medicare Other

## 2018-12-01 ENCOUNTER — Ambulatory Visit (INDEPENDENT_AMBULATORY_CARE_PROVIDER_SITE_OTHER): Payer: Medicare Other | Admitting: Podiatry

## 2018-12-01 ENCOUNTER — Encounter: Payer: Self-pay | Admitting: Podiatry

## 2018-12-01 VITALS — BP 135/73 | HR 66 | Resp 14

## 2018-12-01 DIAGNOSIS — M79674 Pain in right toe(s): Secondary | ICD-10-CM | POA: Diagnosis not present

## 2018-12-01 DIAGNOSIS — B351 Tinea unguium: Secondary | ICD-10-CM | POA: Diagnosis not present

## 2018-12-01 DIAGNOSIS — R259 Unspecified abnormal involuntary movements: Secondary | ICD-10-CM

## 2018-12-01 DIAGNOSIS — E1142 Type 2 diabetes mellitus with diabetic polyneuropathy: Secondary | ICD-10-CM

## 2018-12-01 DIAGNOSIS — M779 Enthesopathy, unspecified: Secondary | ICD-10-CM | POA: Diagnosis not present

## 2018-12-01 DIAGNOSIS — I69398 Other sequelae of cerebral infarction: Secondary | ICD-10-CM | POA: Diagnosis not present

## 2018-12-01 DIAGNOSIS — M79675 Pain in left toe(s): Secondary | ICD-10-CM | POA: Diagnosis not present

## 2018-12-01 DIAGNOSIS — M21372 Foot drop, left foot: Secondary | ICD-10-CM

## 2018-12-01 NOTE — Patient Instructions (Addendum)
Diabetes Mellitus and Foot Care Foot care is an important part of your health, especially when you have diabetes. Diabetes may cause you to have problems because of poor blood flow (circulation) to your feet and legs, which can cause your skin to:  Become thinner and drier.  Break more easily.  Heal more slowly.  Peel and crack. You may also have nerve damage (neuropathy) in your legs and feet, causing decreased feeling in them. This means that you may not notice minor injuries to your feet that could lead to more serious problems. Noticing and addressing any potential problems early is the best way to prevent future foot problems. How to care for your feet Foot hygiene  Wash your feet daily with warm water and mild soap. Do not use hot water. Then, pat your feet and the areas between your toes until they are completely dry. Do not soak your feet as this can dry your skin.  Trim your toenails straight across. Do not dig under them or around the cuticle. File the edges of your nails with an emery board or nail file.  Apply a moisturizing lotion or petroleum jelly to the skin on your feet and to dry, brittle toenails. Use lotion that does not contain alcohol and is unscented. Do not apply lotion between your toes. Shoes and socks  Wear clean socks or stockings every day. Make sure they are not too tight. Do not wear knee-high stockings since they may decrease blood flow to your legs.  Wear shoes that fit properly and have enough cushioning. Always look in your shoes before you put them on to be sure there are no objects inside.  To break in new shoes, wear them for just a few hours a day. This prevents injuries on your feet. Wounds, scrapes, corns, and calluses  Check your feet daily for blisters, cuts, bruises, sores, and redness. If you cannot see the bottom of your feet, use a mirror or ask someone for help.  Do not cut corns or calluses or try to remove them with medicine.  If you  find a minor scrape, cut, or break in the skin on your feet, keep it and the skin around it clean and dry. You may clean these areas with mild soap and water. Do not clean the area with peroxide, alcohol, or iodine.  If you have a wound, scrape, corn, or callus on your foot, look at it several times a day to make sure it is healing and not infected. Check for: ? Redness, swelling, or pain. ? Fluid or blood. ? Warmth. ? Pus or a bad smell. General instructions  Do not cross your legs. This may decrease blood flow to your feet.  Do not use heating pads or hot water bottles on your feet. They may burn your skin. If you have lost feeling in your feet or legs, you may not know this is happening until it is too late.  Protect your feet from hot and cold by wearing shoes, such as at the beach or on hot pavement.  Schedule a complete foot exam at least once a year (annually) or more often if you have foot problems. If you have foot problems, report any cuts, sores, or bruises to your health care provider immediately. Contact a health care provider if:  You have a medical condition that increases your risk of infection and you have any cuts, sores, or bruises on your feet.  You have an injury that is not   healing.  You have redness on your legs or feet.  You feel burning or tingling in your legs or feet.  You have pain or cramps in your legs and feet.  Your legs or feet are numb.  Your feet always feel cold.  You have pain around a toenail. Get help right away if:  You have a wound, scrape, corn, or callus on your foot and: ? You have pain, swelling, or redness that gets worse. ? You have fluid or blood coming from the wound, scrape, corn, or callus. ? Your wound, scrape, corn, or callus feels warm to the touch. ? You have pus or a bad smell coming from the wound, scrape, corn, or callus. ? You have a fever. ? You have a red line going up your leg. Summary  Check your feet every day  for cuts, sores, red spots, swelling, and blisters.  Moisturize feet and legs daily.  Wear shoes that fit properly and have enough cushioning.  If you have foot problems, report any cuts, sores, or bruises to your health care provider immediately.  Schedule a complete foot exam at least once a year (annually) or more often if you have foot problems. This information is not intended to replace advice given to you by your health care provider. Make sure you discuss any questions you have with your health care provider. Document Released: 11/05/2000 Document Revised: 12/21/2017 Document Reviewed: 12/10/2016 Elsevier Interactive Patient Education  2019 [employer]  Onychomycosis/Fungal Toenails  WHAT IS IT? An infection that lies within the keratin of your nail plate that is caused by a fungus.  WHY ME? Fungal infections affect all ages, sexes, races, and creeds.  There may be many factors that predispose you to a fungal infection such as age, coexisting medical conditions such as diabetes, or an autoimmune disease; stress, medications, fatigue, genetics, etc.  Bottom line: fungus thrives in a warm, moist environment and your shoes offer such a location.  IS IT CONTAGIOUS? Theoretically, yes.  You do not want to share shoes, nail clippers or files with someone who has fungal toenails.  Walking around barefoot in the same room or sleeping in the same bed is unlikely to transfer the organism.  It is important to realize, however, that fungus can spread easily from one nail to the next on the same foot.  HOW DO WE TREAT THIS?  There are several ways to treat this condition.  Treatment may depend on many factors such as age, medications, pregnancy, liver and kidney conditions, etc.  It is best to ask your doctor which options are available to you.  1. No treatment.   Unlike many other medical concerns, you can live with this condition.  However for many people this can be a painful condition and  may lead to ingrown toenails or a bacterial infection.  It is recommended that you keep the nails cut short to help reduce the amount of fungal nail. 2. Topical treatment.  These range from herbal remedies to prescription strength nail lacquers.  About 40-50% effective, topicals require twice daily application for approximately 9 to 12 months or until an entirely new nail has grown out.  The most effective topicals are medical grade medications available through physicians offices. 3. Oral antifungal medications.  With an 80-90% cure rate, the most common oral medication requires 3 to 4 months of therapy and stays in your system for a year as the new nail grows out.  Oral antifungal medications do require   blood work to make sure it is a safe drug for you.  A liver function panel will be performed prior to starting the medication and after the first month of treatment.  It is important to have the blood work performed to avoid any harmful side effects.  In general, this medication safe but blood work is required. 4. Laser Therapy.  This treatment is performed by applying a specialized laser to the affected nail plate.  This therapy is noninvasive, fast, and non-painful.  It is not covered by insurance and is therefore, out of pocket.  The results have been very good with a 80-95% cure rate.  The [facility] is the only practice in the area to offer this therapy. 5. Permanent Nail Avulsion.  Removing the entire nail so that a new nail will not grow back. 

## 2018-12-01 NOTE — Progress Notes (Signed)
Subjective:    Patient ID: [name], male    DOB: [birth year], 68 y.o.   MRN: [id number]  HPI Subjective: [name] presents today with history of neuropathy with cc of painful, mycotic toenails.  Pain is aggravated when wearing enclosed shoe gear and relieved with periodic professional debridement.  Patient is also on long-term blood thinner Plavix.  Binnie Rail, MD is his PCP.  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  He relates tightness and his left ankle since 2013 which was when he had a stroke.   Current Outpatient Medications:  .  atorvastatin (LIPITOR) 40 MG tablet, TAKE 1 TABLET BY MOUTH ONCE DAILY .  MUST  HAVE  OFFICE  VISIT  BEFORE  FURTHER  REFILLS., Disp: 90 tablet, Rfl: 2 .  carvedilol (COREG) 12.5 MG tablet, Take 1.5 tablets (18.75 mg total) by mouth 2 (two) times daily., Disp: 270 tablet, Rfl: 1 .  cetirizine (ZYRTEC) 10 MG tablet, Take 10 mg by mouth as needed., Disp: , Rfl:  .  chlorthalidone (HYGROTON) 25 MG tablet, TAKE 1 TABLET BY MOUTH ONCE DAILY, Disp: 90 tablet, Rfl: 1 .  clopidogrel (PLAVIX) 75 MG tablet, Take 1 tablet (75 mg total) by mouth daily., Disp: 90 tablet, Rfl: 1 .  esomeprazole (NEXIUM) 40 MG capsule, TAKE 1 CAPSULE BY MOUTH ONCE DAILY .  MUST  HAVE  OFFICE  VISIT  BEFORE  FURTHER  REFILLS., Disp: 90 capsule, Rfl: 2 .  fluticasone (FLONASE) 50 MCG/ACT nasal spray, Place 2 sprays into both nostrils daily., Disp: 16 g, Rfl: 0 .  glucose blood (PRODIGY NO CODING BLOOD GLUC) test strip, USE ONE STRIP TO CHECK GLUCOSE 4 TIMES DAILY, Disp: 400 each, Rfl: 14 .  Insulin Lispro Prot & Lispro (HUMALOG MIX 50/50 KWIKPEN) (50-50) 100 UNIT/ML Kwikpen, Inject 45 Units into the skin daily with breakfast. And pen needles 1/day, Disp: 30 mL, Rfl: 11 .  losartan (COZAAR) 100 MG tablet, TAKE 1 TABLET BY MOUTH ONCE DAILY, Disp: 90 tablet, Rfl: 1 .  NON FORMULARY, vasinox  cardiovascular suppor, Disp: , Rfl:  .  oseltamivir (TAMIFLU) 75 MG capsule, Take 1 capsule (75 mg total) by mouth 2 (two) times daily for 5 days., Disp: 10 capsule, Rfl: 0 .  PRODIGY TWIST TOP LANCETS 28G MISC, USE   TO CHECK GLUCOSE 4 TIMES DAILY, Disp: 400 each, Rfl: 1 .  RELION PEN NEEDLES 32G X 4 MM MISC, USE 1 ONCE DAILY AS DIRECTED, Disp: , Rfl: 11 .  senna-docusate (SENOKOT-S) 8.6-50 MG per tablet, Take 2 tablets by mouth 2 (two) times daily. For constipation., Disp: , Rfl:   Allergies  Allergen Reactions  . Penicillins Anaphylaxis  . Pneumococcal Vaccines Anaphylaxis  . Shellfish Allergy Anaphylaxis  . Influenza Vaccines   . Lisinopril     cough  . Topiramate Other (See Comments)    Chest spasms and numbness  . Amlodipine Other (See Comments)    LE edema    Review of Systems  Neurological:       Stroke left side  All other systems reviewed and are negative.   Objective:  Vascular Examination: Capillary refill time less than 4 seconds x 10 digits Dorsalis pedis and Posterior tibial pulses are both 2/4 bilaterally Digital hair x 10 digits was absent Skin temperature gradient WNL b/l  Dermatological Examination: Skin is noted to be atrophic bilaterally  No open wounds bilaterally.  Toenails 1-5 right foot discolored, thick, dystrophic with subungual debris and pain with palpation to nailbeds due to thickness of nails.  Remaining toenails are non-dystrophic.  Musculoskeletal: Hammertoe second digit bilaterally Muscle weakness left lower extremity with unsteady gait secondary to CVA Spasticity noted left lower extremity   Neurological: Sensation with 10 gram monofilament is intact right lower extremity; decreased left lower extremity   Vibratory sensation absent left lower extremity; intact right  X-ray revealed no evidence of fracture nor cortical disruption indicating infection.  Again is shown he has hammertoe of the left second digit.  He also has some  dorsal lipping at the first metatarsal cuneiform joint indicating early osteoarthritis.  Assessment: 1. Painful onychomycosis toenails 1-5 right foot 2. NIDDM with neuropathy 3. Left foot drop  Plan: 1. X-ray left foot and ankle.  X-rays reviewed with patient. 2. Toenails 1-5 right foot were debrided in length and girth without iatrogenic bleeding.  3. Will send patient to [facility] for evaluation/recommendations of brace/AFO for dropfoot of the left lower extremity to better stabilize his gait and prevent him from falling. 4. Patient to continue soft, supportive shoe gear 5. Patient to report any pedal injuries to medical professional  6. Follow up 3 months. Patient/POA to call should there be a concern in the interim.

## 2018-12-11 ENCOUNTER — Ambulatory Visit: Payer: Medicare Other | Admitting: Orthotics

## 2018-12-11 DIAGNOSIS — R269 Unspecified abnormalities of gait and mobility: Principal | ICD-10-CM

## 2018-12-11 DIAGNOSIS — E1142 Type 2 diabetes mellitus with diabetic polyneuropathy: Secondary | ICD-10-CM

## 2018-12-11 DIAGNOSIS — I69398 Other sequelae of cerebral infarction: Secondary | ICD-10-CM

## 2018-12-11 NOTE — Progress Notes (Signed)
Patient has severe foot drop w/ genu recurvatum;  Upon assessment, it is best to send to [facility] who can fabricate locally articulated thermoplastic brace w/ doris assist springs and posterior stops (to control G.R). Given script for [employer] to fab.

## 2018-12-18 ENCOUNTER — Encounter: Payer: Self-pay | Admitting: Internal Medicine

## 2018-12-18 ENCOUNTER — Ambulatory Visit (INDEPENDENT_AMBULATORY_CARE_PROVIDER_SITE_OTHER): Payer: Medicare Other | Admitting: Internal Medicine

## 2018-12-18 VITALS — BP 140/70 | HR 97 | Temp 100.4°F | Resp 18 | Ht 71.0 in | Wt 230.8 lb

## 2018-12-18 DIAGNOSIS — J111 Influenza due to unidentified influenza virus with other respiratory manifestations: Secondary | ICD-10-CM

## 2018-12-18 DIAGNOSIS — R509 Fever, unspecified: Secondary | ICD-10-CM

## 2018-12-18 LAB — POCT INFLUENZA A/B
Influenza A, POC: NEGATIVE
Influenza B, POC: NEGATIVE

## 2018-12-18 MED ORDER — OSELTAMIVIR PHOSPHATE 75 MG PO CAPS: 75.0000 mg | ORAL_CAPSULE | Freq: Two times a day (BID) | ORAL | 0 refills | Status: AC

## 2018-12-18 NOTE — Progress Notes (Signed)
Subjective:    Patient ID: [name], male    DOB: 09/17/1951, 68 y.o.   MRN: [id number]  HPI He is here for an acute visit for cold symptoms.  His symptoms started yesterday and it was a sudden onset.  His grandson has the flu -he lives with him - he was diagnosed with the flu three days ago and his daughter was diagnosed 4 days ago.     He is experiencing fever, dec appetite, fatigue, generalized weakness, runny nose, sinus pain, sore throat, cough, SOB last night, occasional wheeze, nausea, body aches, headaches and lightheadedness.    He has not tried taking anything for his symptoms.    Medications and allergies reviewed with patient and updated if appropriate.  Patient Active Problem List   Diagnosis Date Noted  . Edema leg 10/26/2018  . History of stroke 10/26/2018  . DOE (dyspnea on exertion) 10/26/2018  . Left hip pain 07/14/2018  . Acute left ankle pain 07/14/2018  . Chest pain 07/14/2018  . Nonintractable headache 02/24/2018  . CRI (chronic renal insufficiency), stage 3 (moderate) ([place]) 02/22/2017  . Anemia 02/16/2017  . Chronic combined systolic and diastolic heart failure ([place]) 06/29/2016  . LBBB (left bundle branch block) 02/02/2016  . Nonallopathic lesion of lumbosacral region 12/11/2014  . Nonallopathic lesion of sacral region 12/11/2014  . Nonallopathic lesion of thoracic region 12/11/2014  . Arthritis of left hip 11/20/2014  . Ischial bursitis of left side 10/28/2014  . Hamstring tightness of left lower extremity 09/16/2014  . Piriformis syndrome of left side 09/16/2014  . Dysphonia 06/22/2013  . Hemiparesis affecting left side as late effect of stroke ([place]) 10/10/2012  . Dysphagia following cerebrovascular accident 08/14/2012  . Chronic back pain   . Peripheral neuropathy ([place])   . TIA (transient ischemic attack) 04/08/2012  . Stroke ([place]) 02/21/2012  . [place], RIGHT 05/29/2010  . Insulin dependent diabetes mellitus ([place])  04/13/2010  . ALLERGIC RHINITIS 04/13/2010  . GERD 04/13/2010  . ERECTILE DYSFUNCTION 03/12/2009  . Essential hypertension 03/12/2009  . CAD S/P percutaneous coronary angioplasty 03/12/2009  . Hyperlipidemia 03/07/2009    Current Outpatient Medications on File Prior to Visit  Medication Sig Dispense Refill  . atorvastatin (LIPITOR) 40 MG tablet TAKE 1 TABLET BY MOUTH ONCE DAILY .  MUST  HAVE  OFFICE  VISIT  BEFORE  FURTHER  REFILLS. 90 tablet 2  . carvedilol (COREG) 12.5 MG tablet Take 1.5 tablets (18.75 mg total) by mouth 2 (two) times daily. 270 tablet 1  . cetirizine (ZYRTEC) 10 MG tablet Take 10 mg by mouth as needed.    . chlorthalidone (HYGROTON) 25 MG tablet TAKE 1 TABLET BY MOUTH ONCE DAILY 90 tablet 1  . clopidogrel (PLAVIX) 75 MG tablet Take 1 tablet (75 mg total) by mouth daily. 90 tablet 1  . esomeprazole (NEXIUM) 40 MG capsule TAKE 1 CAPSULE BY MOUTH ONCE DAILY .  MUST  HAVE  OFFICE  VISIT  BEFORE  FURTHER  REFILLS. 90 capsule 2  . fluticasone (FLONASE) 50 MCG/ACT nasal spray Place 2 sprays into both nostrils daily. 16 g 0  . glucose blood (PRODIGY NO CODING BLOOD GLUC) test strip USE ONE STRIP TO CHECK GLUCOSE 4 TIMES DAILY 400 each 14  . Insulin Lispro Prot & Lispro (HUMALOG MIX 50/50 KWIKPEN) (50-50) 100 UNIT/ML Kwikpen Inject 45 Units into the skin daily with breakfast. And pen needles 1/day 30 mL 11  . losartan (COZAAR) 100 MG tablet TAKE 1 TABLET  BY MOUTH ONCE DAILY 90 tablet 1  . NON FORMULARY vasinox cardiovascular suppor    . PRODIGY TWIST TOP LANCETS 28G MISC USE   TO CHECK GLUCOSE 4 TIMES DAILY 400 each 1  . RELION PEN NEEDLES 32G X 4 MM MISC USE 1 ONCE DAILY AS DIRECTED  11  . senna-docusate (SENOKOT-S) 8.6-50 MG per tablet Take 2 tablets by mouth 2 (two) times daily. For constipation.     No current facility-administered medications on file prior to visit.     Past Medical History:  Diagnosis Date  . ALLERGIC RHINITIS   . CAD, NATIVE VESSEL    BMS to OM1  2001, DES to BMS 2005  . Chronic back pain    herniated disc  . Chronic combined systolic and diastolic heart failure ([place]) 06/29/2016  . Constipation    takes Carafate four times day  . DIAB W/UNSPEC COMP TYPE II/UNSPEC TYPE UNCNTRL   . ERECTILE DYSFUNCTION   . GERD   . HYPERLIPIDEMIA-MIXED   . HYPERTENSION, BENIGN   . LBBB (left bundle branch block) 02/02/2016  . MORTON'S NEUROMA, RIGHT   . Peripheral neuropathy   . Seasonal allergies    takes Allegra and Benadryl daily prn;uses Flonase daily  . SHOULDER PAIN, RIGHT   . Stroke, thrombotic ([place]) 07/2012   L HP + hemiparesis, s/p CIR   . TIA on medication 02/2012    Past Surgical History:  Procedure Laterality Date  . ANGIOPLASTY    . COLONOSCOPY    . CORONARY ANGIOPLASTY  2005   2 stents  . DENTAL SURGERY    . LARYNGOPLASTY  08/07/2012   Procedure: LARYNGOPLASTY;  Surgeon: Izora Gala, MD;  Location: [facility];  Service: ENT;  Laterality: Left;  Left Vocal Cord Medialyzation  . left knee surgury     x 2   . stent  2001, 2004   coronary stents    Social History   Socioeconomic History  . Marital status: Legally Separated    Spouse name: Not on file  . Number of children: 3  . Years of education: college  . Highest education level: Not on file  Occupational History  . Occupation: disabled    Employer: [employer]  Social Needs  . Financial resource strain: Not very hard  . Food insecurity:    Worry: Never true    Inability: Never true  . Transportation needs:    Medical: No    Non-medical: No  Tobacco Use  . Smoking status: Former Smoker    Last attempt to quit: 02/24/1983    Years since quitting: 35.8  . Smokeless tobacco: Never Used  Substance and Sexual Activity  . Alcohol use: No    Alcohol/week: 0.0 standard drinks  . Drug use: No  . Sexual activity: Never  Lifestyle  . Physical activity:    Days per week: 2 days    Minutes per session: 50 min  . Stress: To some extent  Relationships  . Social  connections:    Talks on phone: More than three times a week    Gets together: More than three times a week    Attends religious service: More than 4 times per year    Active member of club or organization: Yes    Attends meetings of clubs or organizations: More than 4 times per year    Relationship status: Separated  Other Topics Concern  . Not on file  Social History Narrative       Family  History  Problem Relation Age of Onset  . Cancer Mother     Review of Systems  Constitutional: Positive for appetite change, fatigue and fever.  HENT: Positive for rhinorrhea, sinus pain and sore throat. Negative for congestion and ear pain.   Respiratory: Positive for cough, shortness of breath (last night) and wheezing.   Gastrointestinal: Positive for nausea. Negative for diarrhea.  Musculoskeletal: Positive for myalgias.  Neurological: Positive for light-headedness and headaches.       Objective:   Vitals:   12/18/18 1005  BP: 140/70  Pulse: 97  Resp: 18  Temp: (!) 100.4 F (38 C)  SpO2: 99%   Filed Weights   12/18/18 1005  Weight: 230 lb 12.8 oz (104.7 kg)   Body mass index is 32.19 kg/m.  Wt Readings from Last 3 Encounters:  12/18/18 230 lb 12.8 oz (104.7 kg)  11/20/18 228 lb (103.4 kg)  11/03/18 227 lb (103 kg)     Physical Exam GENERAL APPEARANCE: Appears stated age, mildly ill appearing, NAD EYES: conjunctiva clear, no icterus HEENT: bilateral tympanic membranes and ear canals normal, oropharynx with no erythema, no thyromegaly, trachea midline, no cervical or supraclavicular lymphadenopathy LUNGS: Clear to auscultation without wheeze or crackles, unlabored breathing, good air entry bilaterally CARDIOVASCULAR: Normal S1,S2 without murmurs, no edema SKIN: warm, dry        Assessment & Plan:   See Problem List for Assessment and Plan of chronic medical problems.

## 2018-12-18 NOTE — Assessment & Plan Note (Signed)
POCT flu test negative, but given symptoms and two close contacts with the flu I feel he likely has the flu Start tamiflu BID x 5 days Tylenol, coricidin cold products Rest, fluids  Call if no improvement

## 2018-12-18 NOTE — Patient Instructions (Addendum)
You likely have the flu.   Take tamiflu as prescribed.    Take tylenol as needed.  Take over the counter cold medications, such as coricidin as needed.  Drink as much fluids as you can.     Call if no improvement       Influenza, Adult Influenza, more commonly known as "the flu," is a viral infection that mainly affects the respiratory tract. The respiratory tract includes organs that help you breathe, such as the lungs, nose, and throat. The flu causes many symptoms similar to the common cold along with high fever and body aches. The flu spreads easily from person to person (is contagious). Getting a flu shot (influenza vaccination) every year is the best way to prevent the flu. What are the causes? This condition is caused by the influenza virus. You can get the virus by:  Breathing in droplets that are in the air from an infected person's cough or sneeze.  Touching something that has been exposed to the virus (has been contaminated) and then touching your mouth, nose, or eyes. What increases the risk? The following factors may make you more likely to get the flu:  Not washing or sanitizing your hands often.  Having close contact with many people during cold and flu season.  Touching your mouth, eyes, or nose without first washing or sanitizing your hands.  Not getting a yearly (annual) flu shot. You may have a higher risk for the flu, including serious problems such as a lung infection (pneumonia), if you:  Are older than 65.  Are pregnant.  Have a weakened disease-fighting system (immune system). You may have a weakened immune system if you: ? Have HIV or AIDS. ? Are undergoing chemotherapy. ? Are taking medicines that reduce (suppress) the activity of your immune system.  Have a long-term (chronic) illness, such as heart disease, kidney disease, diabetes, or lung disease.  Have a liver disorder.  Are severely overweight (morbidly obese).  Have anemia. This is a  condition that affects your red blood cells.  Have asthma. What are the signs or symptoms? Symptoms of this condition usually begin suddenly and last 4-14 days. They may include:  Fever and chills.  Headaches, body aches, or muscle aches.  Sore throat.  Cough.  Runny or stuffy (congested) nose.  Chest discomfort.  Poor appetite.  Weakness or fatigue.  Dizziness.  Nausea or vomiting. How is this diagnosed? This condition may be diagnosed based on:  Your symptoms and medical history.  A physical exam.  Swabbing your nose or throat and testing the fluid for the influenza virus. How is this treated? If the flu is diagnosed early, you can be treated with medicine that can help reduce how severe the illness is and how long it lasts (antiviral medicine). This may be given by mouth (orally) or through an IV. Taking care of yourself at home can help relieve symptoms. Your health care provider may recommend:  Taking over-the-counter medicines.  Drinking plenty of fluids. In many cases, the flu goes away on its own. If you have severe symptoms or complications, you may be treated in a hospital. Follow these instructions at home: Activity  Rest as needed and get plenty of sleep.  Stay home from work or school as told by your health care provider. Unless you are visiting your health care provider, avoid leaving home until your fever has been gone for 24 hours without taking medicine. Eating and drinking  Take an oral rehydration solution (  ORS). This is a drink that is sold at pharmacies and retail stores.  Drink enough fluid to keep your urine pale yellow.  Drink clear fluids in small amounts as you are able. Clear fluids include water, ice chips, diluted fruit juice, and low-calorie sports drinks.  Eat bland, easy-to-digest foods in small amounts as you are able. These foods include bananas, applesauce, rice, lean meats, toast, and crackers.  Avoid drinking fluids that  contain a lot of sugar or caffeine, such as energy drinks, regular sports drinks, and soda.  Avoid alcohol.  Avoid spicy or fatty foods. General instructions      Take over-the-counter and prescription medicines only as told by your health care provider.  Use a cool mist humidifier to add humidity to the air in your home. This can make it easier to breathe.  Cover your mouth and nose when you cough or sneeze.  Wash your hands with soap and water often, especially after you cough or sneeze. If soap and water are not available, use alcohol-based hand sanitizer.  Keep all follow-up visits as told by your health care provider. This is important. How is this prevented?   Get an annual flu shot. You may get the flu shot in late summer, fall, or winter. Ask your health care provider when you should get your flu shot.  Avoid contact with people who are sick during cold and flu season. This is generally fall and winter. Contact a health care provider if:  You develop new symptoms.  You have: ? Chest pain. ? Diarrhea. ? A fever.  Your cough gets worse.  You produce more mucus.  You feel nauseous or you vomit. Get help right away if:  You develop shortness of breath or difficulty breathing.  Your skin or nails turn a bluish color.  You have severe pain or stiffness in your neck.  You develop a sudden headache or sudden pain in your face or ear.  You cannot eat or drink without vomiting. Summary  Influenza, more commonly known as "the flu," is a viral infection that primarily affects your respiratory tract.  Symptoms of the flu usually begin suddenly and last 4-14 days.  Getting an annual flu shot is the best way to prevent getting the flu.  Stay home from work or school as told by your health care provider. Unless you are visiting your health care provider, avoid leaving home until your fever has been gone for 24 hours without taking medicine.  Keep all follow-up  visits as told by your health care provider. This is important. This information is not intended to replace advice given to you by your health care provider. Make sure you discuss any questions you have with your health care provider. Document Released: 11/05/2000 Document Revised: 04/26/2018 Document Reviewed: 04/26/2018 Elsevier Interactive Patient Education  2019 [employer].

## 2018-12-20 ENCOUNTER — Ambulatory Visit: Payer: Medicare Other | Admitting: Endocrinology

## 2019-01-04 ENCOUNTER — Telehealth: Payer: Self-pay | Admitting: Endocrinology

## 2019-01-04 NOTE — Telephone Encounter (Signed)
Patient called to advise that he had a low blood sugar of 28 on Tuesday.  Patient states "he is doing a 21 day cleanse with another doctor who advised him to contact Dr Loanne Drilling and inquire about changing insulin"  Best number for contact is [phone]

## 2019-01-04 NOTE — Telephone Encounter (Signed)
Please advise 

## 2019-01-04 NOTE — Telephone Encounter (Signed)
Called pt and made him aware of new orders. Verbalized acceptance and understanding.

## 2019-01-04 NOTE — Telephone Encounter (Signed)
Please reduce insulin to 30 units with breakfast.  I'll see you next week, as sched.

## 2019-01-08 ENCOUNTER — Telehealth: Payer: Self-pay | Admitting: Endocrinology

## 2019-01-08 ENCOUNTER — Ambulatory Visit: Payer: Medicare Other | Admitting: Endocrinology

## 2019-01-08 NOTE — Telephone Encounter (Signed)
Patient stated that Dr.Ellison changed his insulin last week and he believes he is taking to much due to low sugar readings.  Please Advise, thanks

## 2019-01-08 NOTE — Telephone Encounter (Signed)
Attempted to return pt call to inquire further. Mailbox is full and not accepting incoming messages at this time.

## 2019-01-16 ENCOUNTER — Encounter: Payer: Self-pay | Admitting: Endocrinology

## 2019-01-16 ENCOUNTER — Ambulatory Visit (INDEPENDENT_AMBULATORY_CARE_PROVIDER_SITE_OTHER): Payer: Medicare Other | Admitting: Endocrinology

## 2019-01-16 VITALS — BP 160/82 | HR 83 | Ht 71.0 in | Wt 225.0 lb

## 2019-01-16 DIAGNOSIS — E1165 Type 2 diabetes mellitus with hyperglycemia: Secondary | ICD-10-CM | POA: Diagnosis not present

## 2019-01-16 DIAGNOSIS — E1140 Type 2 diabetes mellitus with diabetic neuropathy, unspecified: Secondary | ICD-10-CM

## 2019-01-16 DIAGNOSIS — IMO0002 Reserved for concepts with insufficient information to code with codable children: Secondary | ICD-10-CM

## 2019-01-16 LAB — POCT GLYCOSYLATED HEMOGLOBIN (HGB A1C): Hemoglobin A1C: 7 % — AB (ref 4.0–5.6)

## 2019-01-16 MED ORDER — INSULIN LISPRO PROT & LISPRO (50-50 MIX) 100 UNIT/ML KWIKPEN: 22.0000 [IU] | PEN_INJECTOR | Freq: Every day | SUBCUTANEOUS | 11 refills | Status: DC

## 2019-01-16 NOTE — Progress Notes (Signed)
Subjective:    Patient ID: [name], male    DOB: Mar 12, 1951, 68 y.o.   MRN: [id number]  HPI Pt returns for f/u of diabetes mellitus: DM type: Insulin-requiring type 2 Dx'ed: 8676 Complications: polyneuropathy, CAD, renal insufficiency, retinopathy, and TIA.   Therapy: insulin since 2011 DKA: never.  Severe hypoglycemia: last episode was early 2020.   Pancreatitis: never.  Other: He says he cannot use syringe and vial, due to tremor; he changed lantus to NPH, then 70/30, then 50/50, due to pattern on cbg's; he declines multiple daily injections.   Interval history: Pt says he still has hypoglycemia after breakfast, even with recent dosage reduction.  He says he never misses the insulin.  no cbg record, but states cbg's vary from 28-250.  It is still lowest after breakfast.  He takes multiple supplements from chiropractor for "cleansing and detox." Past Medical History:  Diagnosis Date  . ALLERGIC RHINITIS   . CAD, NATIVE VESSEL    BMS to OM1 2001, DES to BMS 2005  . Chronic back pain    herniated disc  . Chronic combined systolic and diastolic heart failure ([place]) 06/29/2016  . Constipation    takes Carafate four times day  . DIAB W/UNSPEC COMP TYPE II/UNSPEC TYPE UNCNTRL   . ERECTILE DYSFUNCTION   . GERD   . HYPERLIPIDEMIA-MIXED   . HYPERTENSION, BENIGN   . LBBB (left bundle branch block) 02/02/2016  . MORTON'S NEUROMA, RIGHT   . Peripheral neuropathy   . Seasonal allergies    takes Allegra and Benadryl daily prn;uses Flonase daily  . SHOULDER PAIN, RIGHT   . 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 TIA on medication 02/2012    Past Surgical History:  Procedure Laterality Date  . ANGIOPLASTY    . COLONOSCOPY    . CORONARY ANGIOPLASTY  2005   2 stents  . DENTAL SURGERY    . LARYNGOPLASTY  08/07/2012   Procedure: LARYNGOPLASTY;  Surgeon: Izora Gala, MD;  Location: [facility];  Service: ENT;  Laterality: Left;  Left Vocal Cord Medialyzation    . left knee surgury     x 2   . stent  2001, 2004   coronary stents    Social History   Socioeconomic History  . Marital status: Legally Separated    Spouse name: Not on file  . Number of children: 3  . Years of education: college  . Highest education level: Not on file  Occupational History  . Occupation: disabled    Employer: [employer]  Social Needs  . Financial resource strain: Not very hard  . Food insecurity:    Worry: Never true    Inability: Never true  . Transportation needs:    Medical: No    Non-medical: No  Tobacco Use  . Smoking status: Former Smoker    Last attempt to quit: 02/24/1983    Years since quitting: 35.9  . Smokeless tobacco: Never Used  Substance and Sexual Activity  . Alcohol use: No    Alcohol/week: 0.0 standard drinks  . Drug use: No  . Sexual activity: Never  Lifestyle  . Physical activity:    Days per week: 2 days    Minutes per session: 50 min  . Stress: To some extent  Relationships  . Social connections:    Talks on phone: More than three times a week    Gets together: More than three times a week  Attends religious service: More than 4 times per year    Active member of club or organization: Yes    Attends meetings of clubs or organizations: More than 4 times per year    Relationship status: Separated  . Intimate partner violence:    Fear of current or ex partner: Not on file    Emotionally abused: Not on file    Physically abused: Not on file    Forced sexual activity: Not on file  Other Topics Concern  . Not on file  Social History Narrative       Current Outpatient Medications on File Prior to Visit  Medication Sig Dispense Refill  . atorvastatin (LIPITOR) 40 MG tablet TAKE 1 TABLET BY MOUTH ONCE DAILY .  MUST  HAVE  OFFICE  VISIT  BEFORE  FURTHER  REFILLS. 90 tablet 2  . carvedilol (COREG) 12.5 MG tablet Take 1.5 tablets (18.75 mg total) by mouth 2 (two) times daily. 270 tablet 1  . cetirizine (ZYRTEC) 10 MG tablet  Take 10 mg by mouth as needed.    . chlorthalidone (HYGROTON) 25 MG tablet TAKE 1 TABLET BY MOUTH ONCE DAILY 90 tablet 1  . clopidogrel (PLAVIX) 75 MG tablet Take 1 tablet (75 mg total) by mouth daily. 90 tablet 1  . esomeprazole (NEXIUM) 40 MG capsule TAKE 1 CAPSULE BY MOUTH ONCE DAILY .  MUST  HAVE  OFFICE  VISIT  BEFORE  FURTHER  REFILLS. 90 capsule 2  . FIBER COMPLETE PO Take 1 Scoop by mouth daily. Whole Food Fiber    . fluticasone (FLONASE) 50 MCG/ACT nasal spray Place 2 sprays into both nostrils daily. 16 g 0  . glucose blood (PRODIGY NO CODING BLOOD GLUC) test strip USE ONE STRIP TO CHECK GLUCOSE 4 TIMES DAILY 400 each 14  . Homeopathic Products (BODYANEW CLEANSE MULTIPACK) LIQD Take 1 each by mouth daily.    Marland Kitchen losartan (COZAAR) 100 MG tablet TAKE 1 TABLET BY MOUTH ONCE DAILY 90 tablet 1  . NON FORMULARY vasinox cardiovascular suppor    . NON FORMULARY Take 17 tablets by mouth daily. Glucobalance    . NON FORMULARY Take 4 Scoops by mouth daily. SP Complete Dairy Free Dietary Supplement    . OVER THE COUNTER MEDICATION Take 6 tablets by mouth daily. NeuroRenew    . PRODIGY TWIST TOP LANCETS 28G MISC USE   TO CHECK GLUCOSE 4 TIMES DAILY 400 each 1  . RELION PEN NEEDLES 32G X 4 MM MISC USE 1 ONCE DAILY AS DIRECTED  11  . senna-docusate (SENOKOT-S) 8.6-50 MG per tablet Take 2 tablets by mouth 2 (two) times daily. For constipation.     No current facility-administered medications on file prior to visit.     Allergies  Allergen Reactions  . Penicillins Anaphylaxis  . Pneumococcal Vaccines Anaphylaxis  . Shellfish Allergy Anaphylaxis  . Influenza Vaccines   . Lisinopril     cough  . Topiramate Other (See Comments)    Chest spasms and numbness  . Amlodipine Other (See Comments)    LE edema    Family History  Problem Relation Age of Onset  . Cancer Mother     BP (!) 160/82 (BP Location: Left Arm, Patient Position: Sitting, Cuff Size: Large)   Pulse 83   Ht 5\' 11"  (1.803 m)    Wt 225 lb (102.1 kg)   SpO2 97%   BMI 31.38 kg/m    Review of Systems Denies LOC    Objective:  Physical Exam VITAL SIGNS:  See vs page GENERAL: no distress Pulses: dorsalis pedis intact bilat.   MSK: no deformity of the feet CV: no leg edema Skin:  no ulcer on the feet.  normal color and temp on the feet. Neuro: sensation is intact to touch on the feet.    Lab Results  Component Value Date   HGBA1C 7.0 (A) 01/16/2019    Lab Results  Component Value Date   CREATININE 1.74 (H) 08/28/2018   BUN 26 (H) 08/28/2018   NA 139 08/28/2018   K 3.6 08/28/2018   CL 103 08/28/2018   CO2 30 08/28/2018      Assessment & Plan:  HTN: is noted today Insulin-requiring type 2 DM: overcontrolled, given this regimen, which does match insulin to her changing needs throughout the day Hypoglycemia: This limits aggressiveness of glycemic control Renal insuff: he needs a fast-acting qd insulin mixture  Patient Instructions  Your blood pressure is high today.  Please see your primary care provider soon, to have it rechecked Please reduce the insulin to 22 units with breakfast  On this type of insulin schedule, you should eat meals on a regular schedule (especially breakfast and lunch).  If a meal is missed or significantly delayed, your blood sugar could go low.   Please come back for a follow-up appointment in 2 months.  check your blood sugar twice a day.  vary the time of day when you check, between before the 3 meals, and at bedtime.  also check if you have symptoms of your blood sugar being too high or too low.  please keep a record of the readings and bring it to your next appointment here (or you can bring the meter itself).  You can write it on any piece of paper.  please call us sooner if your blood sugar goes below 70, or if you have a lot of readings over 200.

## 2019-01-16 NOTE — Patient Instructions (Addendum)
Your blood pressure is high today.  Please see your primary care provider soon, to have it rechecked Please reduce the insulin to 22 units with breakfast  On this type of insulin schedule, you should eat meals on a regular schedule (especially breakfast and lunch).  If a meal is missed or significantly delayed, your blood sugar could go low.   Please come back for a follow-up appointment in 2 months.  check your blood sugar twice a day.  vary the time of day when you check, between before the 3 meals, and at bedtime.  also check if you have symptoms of your blood sugar being too high or too low.  please keep a record of the readings and bring it to your next appointment here (or you can bring the meter itself).  You can write it on any piece of paper.  please call us sooner if your blood sugar goes below 70, or if you have a lot of readings over 200.

## 2019-01-22 ENCOUNTER — Telehealth: Payer: Self-pay | Admitting: Endocrinology

## 2019-01-22 NOTE — Telephone Encounter (Signed)
Sorry, my mistake: no limitation on receiving a massage.

## 2019-01-22 NOTE — Telephone Encounter (Signed)
Called pt to clarify his request. States, the last time he received a massage, he was detoxing and also had increased his insulin resulting in hypoglycemic effects. According to the pt and the site performing his massages, they are wanting to ensure that if he continues massages, will he continue to have low CBG's/hypoglycemic effects. Pt is aware that this type of specific may not be released to the massage facility but may contain more generalized information about whether he can or cannot have a massage. Verbalized acceptance and understanding.

## 2019-01-22 NOTE — Telephone Encounter (Signed)
Patient requests a note signed that states that patient can receive massages twice per month having diabetes. If questions please call patient at ph# [phone].

## 2019-01-22 NOTE — Telephone Encounter (Signed)
Please advise 

## 2019-01-22 NOTE — Telephone Encounter (Signed)
It is fine to receive messages, but I need to know why he would need a note to do so.

## 2019-01-23 NOTE — Telephone Encounter (Signed)
Letter mailed

## 2019-02-08 ENCOUNTER — Telehealth: Payer: Self-pay | Admitting: Cardiovascular Disease

## 2019-02-08 NOTE — Telephone Encounter (Signed)
New Message    Pt is calling wondering if it is necessary for him to come to his appointment next week Please call

## 2019-02-08 NOTE — Telephone Encounter (Signed)
COVID-19 Pre-Screening Questions:  Provider: [facility]   Needs f/u  >6 WEEKS  . Have you been in contact with someone that was recently sick with fever/cough or confirmed to have the [place] virus?  NO  *Contact with a confirmed case should stay at home, away from confirmed patient, monitor symptoms, and reach out to PCP for e-visit/additional testing.  2. Do you have any of the following symptoms [cough, fever (100.4 or greater)], and/or shortness of breath)?  NO  *ALL PTS W/ FEVER SHOULD BE REFERRED TO PCP FOR E-VISIT* _________________________________________________  Cardiac Questionnaire:    Since your last visit or hospitalization:    1. Have you been having chest pain? NO   2. Have you been having shortness of breath? NO   3. Have you been having increasing edema, wt gain, or increase in abdominal girth (pants fitting more tightly)? NO   4. Have you had any passing out spells? NO    *A YES to any of these questions would result in the appointment being kept.  *If all the answers to these questions are NO, we should indicate that given the current situation regarding the worldwide coronarvirus pandemic, at the recommendation of the CDC, we are looking to limit gatherings in our waiting area, and thus will reschedule their appointment beyond four weeks from today.

## 2019-02-13 ENCOUNTER — Ambulatory Visit: Payer: Medicare Other | Admitting: Cardiovascular Disease

## 2019-02-19 NOTE — Telephone Encounter (Signed)
TRIED TO CALL PT PHONE RINGS ONCE THEN HANGS UP WILL TRY AGAIN LATER

## 2019-02-19 NOTE — Telephone Encounter (Signed)
-----   Message from Lendon Colonel, NP sent at 02/19/2019  8:20 AM EDT ----- Regarding: Schedule virutal appointment You can add this patient to my schedule for virtual visit.   Thank you.

## 2019-02-19 NOTE — Telephone Encounter (Signed)
left message to see it pt wants to make virtual visit. Please schedule

## 2019-02-20 NOTE — Telephone Encounter (Signed)
LM2CB TO SCHEDULE VIRTUAL VISIT

## 2019-02-22 ENCOUNTER — Ambulatory Visit: Payer: Self-pay | Admitting: Internal Medicine

## 2019-02-22 NOTE — Telephone Encounter (Signed)
Follow up:   Patient returning call back for a appt please call patient.

## 2019-02-22 NOTE — Telephone Encounter (Signed)
Called pt he states that he was just talking to a nurse(see other phone note for today with Dalene Carrow). He has been having high BP but has not taken his BP meds yet today. He states that his BP yesterday was 160/92 but was higher this morning but does not have the number, he takes the BP while I am on the phone with him it is 189/94 HR 71. I stressed that  He should take his medication at the same time daily and make sure to takeBPt about 1 hour after taking medication. He states that he will take mediation now then take his BP an hour after that. He denies any symptoms right now, no CP or pressure,or SOB. He will CB if BP persists. He will keep appt scheduled on Monday. He will rest until his appt on Monday. He will go to the ER if BP>200 or has CP.

## 2019-02-22 NOTE — Telephone Encounter (Signed)
Pt called in c/o his BP being elevated for the last 3 weeks.  Taking BP meds as directed daily.   He mentioned having dizziness and chest pain that has occurred 3 times during the last 3 weeks.  He has a history of a stroke in 2013 but not sure if he had a heart attack or not.  No chest pain or dizziness today.   BP 176/106 now  Pulse 71.  Just as he got this BP reading for me I got a secure chat message from Veronda Prude, LPN from cardiology asking if I was talking with this pt now?   I typed back that I was.   I let Mr. Riera know I got this message and he said he had a call planned with cardiology this morning.   I let him know the nurse just typed me a message that she was trying to get a hold of him.    So we ended the call so he could talk wth Sharyn Lull, [place].   I let Sharyn Lull know via secure chat that he was off of the phone with me so she could call him.  She let me know she got a hold of him.    So that ended my triage with Mr. Raby. Reason for Disposition . [1] Chest pain lasts > 5 minutes AND [2] occurred > 3 days ago (72 hours) AND [3] NO chest pain or cardiac symptoms now  Answer Assessment - Initial Assessment Questions 1. BLOOD PRESSURE: "What is the blood pressure?" "Did you take at least two measurements 5 minutes apart?"  BP 160/90 yesterday.   I'm on the telehealth with the [place].   I check it twice a day.    I get my BP medications from Dr. Quay Burow not the [place].     *2. ONSET: "When did you take your blood pressure?"     Yesterday about 11:00 AM 3. HOW: "How did you obtain the blood pressure?" (e.g., visiting nurse, automatic home BP monitor)     Automatic BP cuff 4. HISTORY: "Do you have a history of high blood pressure?"     Yes 5. MEDICATIONS: "Are you taking any medications for blood pressure?" "Have you missed any doses recently?"     Yes   Taken as directed daily 6. OTHER SYMPTOMS: "Do you have any symptoms?" (e.g., headache, chest pain, blurred vision, difficulty  breathing, weakness)     Pressure in the center of my face and in the temples.  It's not as bad as yesterday.   No vision changes yesterday,  Chest pain. 7. PREGNANCY: "Is there any chance you are pregnant?" "When was your last menstrual period?"     N/A  Answer Assessment - Initial Assessment Questions 1. LOCATION: "Where does it hurt?"       I feel chest pain in the center upper part of my chest. 2. RADIATION: "Does the pain go anywhere else?" (e.g., into neck, jaw, arms, back)     I felt it in the left lower back.   I've had a stroke and the pain is from that. 3. ONSET: "When did the chest pain begin?" (Minutes, hours or days)      My lower back on left has had pressure since my BP has been up.  My BP has been up for 3 weeks. 4. PATTERN "Does the pain come and go, or has it been constant since it started?"  "Does it get worse with exertion?"  The pressure in my chest comes and goes. 5. DURATION: "How long does it last" (e.g., seconds, minutes, hours)     I've had it 3 times during the last 3 weeks with the BP high. 6. SEVERITY: "How bad is the pain?"  (e.g., Scale 1-10; mild, moderate, or severe)    - MILD (1-3): doesn't interfere with normal activities     - MODERATE (4-7): interferes with normal activities or awakens from sleep    - SEVERE (8-10): excruciating pain, unable to do any normal activities       Now 0 but a 7 when it was it worst.   I took an aspirin then, and the pain went away.   It happened 2 weeks ago with the high BP. 7. CARDIAC RISK FACTORS: "Do you have any history of heart problems or risk factors for heart disease?" (e.g., prior heart attack, angina; high blood pressure, diabetes, being overweight, high cholesterol, smoking, or strong family history of heart disease)     I had a heart attack and a stroke in 2013.  I'm not sure if I had a heart attack or not but I did have a stroke. 8. PULMONARY RISK FACTORS: "Do you have any history of lung disease?"  (e.g., blood  clots in lung, asthma, emphysema, birth control pills)     No   Stopped 30 yrs ago 64. CAUSE: "What do you think is causing the chest pain?"     I have trouble with allergies.   I was coughing up a little mucus that's white. 10. OTHER SYMPTOMS: "Do you have any other symptoms?" (e.g., dizziness, nausea, vomiting, sweating, fever, difficulty breathing, cough)       A little dizzy when my sugar was down.    My sugar was down to 28 and I blacked out.  I was getting a massage that's part of my therapy.  When I stood up from the massage and I blacked out.   EMS was called and they got my sugar up so I didn't go to the hospital..  6 weeks ago this happened. 11. PREGNANCY: "Is there any chance you are pregnant?" "When was your last menstrual period?"       N/A  Protocols used: CHEST PAIN-A-AH, HIGH BLOOD PRESSURE-A-AH

## 2019-02-22 NOTE — Telephone Encounter (Signed)
I believe you have been trying to contact this patient regarding virtual visit.

## 2019-02-26 ENCOUNTER — Telehealth (INDEPENDENT_AMBULATORY_CARE_PROVIDER_SITE_OTHER): Payer: Medicare Other | Admitting: Adult Health

## 2019-02-26 VITALS — BP 161/85 | HR 74 | Ht 71.0 in | Wt 210.0 lb

## 2019-02-26 DIAGNOSIS — I6330 Cerebral infarction due to thrombosis of unspecified cerebral artery: Secondary | ICD-10-CM

## 2019-02-26 DIAGNOSIS — E7800 Pure hypercholesterolemia, unspecified: Secondary | ICD-10-CM

## 2019-02-26 DIAGNOSIS — Z79899 Other long term (current) drug therapy: Secondary | ICD-10-CM

## 2019-02-26 DIAGNOSIS — I10 Essential (primary) hypertension: Secondary | ICD-10-CM

## 2019-02-26 DIAGNOSIS — I2510 Atherosclerotic heart disease of native coronary artery without angina pectoris: Secondary | ICD-10-CM

## 2019-02-26 MED ORDER — CLOPIDOGREL BISULFATE 75 MG PO TABS: 75.0000 mg | ORAL_TABLET | Freq: Every day | ORAL | 1 refills | Status: DC

## 2019-02-26 MED ORDER — ATORVASTATIN CALCIUM 40 MG PO TABS: ORAL_TABLET | ORAL | 2 refills | Status: DC

## 2019-02-26 MED ORDER — CHLORTHALIDONE 25 MG PO TABS: 25.0000 mg | ORAL_TABLET | Freq: Every day | ORAL | 1 refills | Status: DC

## 2019-02-26 MED ORDER — LOSARTAN POTASSIUM 100 MG PO TABS: 100.0000 mg | ORAL_TABLET | Freq: Every day | ORAL | 1 refills | Status: DC

## 2019-02-26 NOTE — Progress Notes (Signed)
Virtual Visit via Telephone Note   This visit type was conducted due to national recommendations for restrictions regarding the COVID-19 Pandemic (e.g. social distancing) in an effort to limit this patient's exposure and mitigate transmission in our community.  Due to his co-morbid illnesses, this patient is at least at moderate risk for complications without adequate follow up.  This format is felt to be most appropriate for this patient at this time.  The patient did not have access to video technology/had technical difficulties with video requiring transitioning to audio format only (telephone).  All issues noted in this document were discussed and addressed.  No physical exam could be performed with this format.  Please refer to the patient's chart for his  consent to telehealth for [facility].   Patient has given permission to conduct this visit via virtual appointment and to bill insurance.   Evaluation Performed:  Follow-up visit  Date:  02/26/2019   ID:  [name], DOB 12/14/1950, MRN [id number]  Patient Location:  [address] [place] [address]   Provider location:   [place], [place]-Home office   PCP:  Binnie Rail, MD  Cardiologist:  Dr. Oval Linsey  Electrophysiologist:  None   Chief Complaint:    History of Present Illness:    [name] is a 68 y.o. male who presents via audio/video conferencing for a telehealth visit today for ongoing assessment and management of CAD, with OM1 BMS in 2001, instent restenosis with DES to [place] in 2005; CVA in 2015 right thalamic/posterior limb internal capsule  Infarct with spastic left residual hemiparesis , LBBB, with NM study in 01/2016 read as an intermediate study secondary to reduced LVEF of 39%. Repeat echo April of 2017 EF of 40%-45%.   He has follow up with Kerin Ransom, PA on 11/20/2018, after stopping amlodipine for LEE, and changing metoprolol to coreg for better BP control.. Stress myoview completed  on that follow up revealed that it was unchanged from 2017 study with a LBBB.   Due to BP not optimally controlled, he was increased on the coreg to 18.75 mg BID. On today's visit he is hypertensive. He states that he ran out of his losartan, and didn't realize that he wasn't taking it. BP's are reflective of that. He denies any new symptoms.   He has followed up with his endocrinologist, with adjustments in his insulin as he was becoming hypoglycemic and had one syncopal episode a month or so ago. He has not had any since.   The patient does not have  symptoms concerning for COVID-19 infection (fever, chills, cough, or new SHORTNESS OF BREATH).    Prior CV studies:   The following studies were reviewed today: Echocardiogram 09/20/2018 Left ventricle: abnormal septal motion. The cavity size was   mildly dilated. Wall thickness was increased in a pattern of   severe LVH. Systolic function was mildly to moderately reduced.   The estimated ejection fraction was in the range of 40% to 45%.   Doppler parameters are consistent with abnormal left ventricular   relaxation (grade 1 diastolic dysfunction). - Atrial septum: There was increased thickness of the septum,   consistent with lipomatous hypertrophy. No defect or patent   foramen ovale was identified.  NM Stress Test 11/03/2018   Nuclear stress EF: 43%. The left ventricular ejection fraction is moderately decreased (30-44%).  Defect 1: There is a medium defect of moderate severity present in the mid anteroseptal, mid inferoseptal, apical septal and apex location. This  septal defect is likely due to the LBBB.  This is an intermediate risk study. This study is similar to the previous study in 2017.     Past Medical History:  Diagnosis Date  . ALLERGIC RHINITIS   . CAD, NATIVE VESSEL    BMS to OM1 2001, DES to BMS 2005  . Chronic back pain    herniated disc  . Chronic combined systolic and diastolic heart failure ([place]) 06/29/2016  .  Constipation    takes Carafate four times day  . DIAB W/UNSPEC COMP TYPE II/UNSPEC TYPE UNCNTRL   . ERECTILE DYSFUNCTION   . GERD   . HYPERLIPIDEMIA-MIXED   . HYPERTENSION, BENIGN   . LBBB (left bundle branch block) 02/02/2016  . MORTON'S NEUROMA, RIGHT   . Peripheral neuropathy   . Seasonal allergies    takes Allegra and Benadryl daily prn;uses Flonase daily  . SHOULDER PAIN, RIGHT   . 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 TIA on medication 02/2012   Past Surgical History:  Procedure Laterality Date  . ANGIOPLASTY    . COLONOSCOPY    . CORONARY ANGIOPLASTY  2005   2 stents  . DENTAL SURGERY    . LARYNGOPLASTY  08/07/2012   Procedure: LARYNGOPLASTY;  Surgeon: Izora Gala, MD;  Location: [facility];  Service: ENT;  Laterality: Left;  Left Vocal Cord Medialyzation  . left knee surgury     x 2   . stent  2001, 2004   coronary stents     Current Meds  Medication Sig  . atorvastatin (LIPITOR) 40 MG tablet TAKE 1 TABLET BY MOUTH ONCE DAILY .  MUST  HAVE  OFFICE  VISIT  BEFORE  FURTHER  REFILLS.  . chlorthalidone (HYGROTON) 25 MG tablet TAKE 1 TABLET BY MOUTH ONCE DAILY  . clopidogrel (PLAVIX) 75 MG tablet Take 1 tablet (75 mg total) by mouth daily.  Marland Kitchen esomeprazole (NEXIUM) 40 MG capsule TAKE 1 CAPSULE BY MOUTH ONCE DAILY .  MUST  HAVE  OFFICE  VISIT  BEFORE  FURTHER  REFILLS.  . fluticasone (FLONASE) 50 MCG/ACT nasal spray Place 2 sprays into both nostrils daily.  . Insulin Lispro Prot & Lispro (HUMALOG MIX 50/50 KWIKPEN) (50-50) 100 UNIT/ML Kwikpen Inject 22 Units into the skin daily with breakfast. And pen needles 1/day     Allergies:   Penicillins; Pneumococcal vaccines; Shellfish allergy; Influenza vaccines; Lisinopril; Topiramate; and Amlodipine   Social History   Tobacco Use  . Smoking status: Former Smoker    Last attempt to quit: 02/24/1983    Years since quitting: 36.0  . Smokeless tobacco: Never Used  Substance Use Topics  . Alcohol use: No     Alcohol/week: 0.0 standard drinks  . Drug use: No     Family Hx: The patient's family history includes Cancer in his mother.  ROS:   Please see the history of present illness.    All other systems reviewed and are negative.   Labs/Other Tests and Data Reviewed:    Recent Labs: 08/28/2018: ALT 26; BUN 26; Creatinine, Ser 1.74; Hemoglobin 11.7; Platelets 215.0; Potassium 3.6; Sodium 139   Recent Lipid Panel Lab Results  Component Value Date/Time   CHOL 110 08/28/2018 10:12 AM   CHOL 164 07/20/2017 12:02 PM   TRIG 131.0 08/28/2018 10:12 AM   TRIG 200 04/20/2009   HDL 49.60 08/28/2018 10:12 AM   HDL 55 07/20/2017 12:02 PM   CHOLHDL 2 08/28/2018 10:12  AM   LDLCALC 34 08/28/2018 10:12 AM   LDLCALC 82 07/20/2017 12:02 PM   LDLDIRECT 63.0 01/12/2016 09:31 AM    Wt Readings from Last 3 Encounters:  02/26/19 210 lb (95.3 kg)  01/16/19 225 lb (102.1 kg)  12/18/18 230 lb 12.8 oz (104.7 kg)     Exam:    Vital Signs:  BP (!) 161/85 (BP Location: Left Arm) Comment: mo medications  Pulse 74   Ht 5\' 11"  (1.803 m)   Wt 210 lb (95.3 kg)   BMI 29.29 kg/m    Assessment is limited due to telephone visit. No overt symptoms identified.   ASSESSMENT & PLAN:    1.  CAD: Hx of DES to the OM1 in 2005. Repeat stress test was unchanged in 2017. No new planned testing. I will stop his Nexium as he is on Plavix.  He is to take Tums for GERD symptoms. Zantac and Pepcid are on recall.  He will have follow up labs in 3 months.   2. Hypertension: BP is not well controlled. He had run out of his losartan and was also not taking increased dose of coreg at 18.75 as requested on last appointment. I have reviewed his medications with him. He will start back on losartan 100 mg as directed. Leave coreg at 12.5 mg BID for now. He is to take his BP BID and record this for [place]. He will call us back in about a week to report his BP status.   3. Hypercholesterolemia: He will continue atorvastatin as directed.  I will check lipids and LFT's in 3 months.   COVID-19 Education: The signs and symptoms of COVID-19 were discussed with the patient and how to seek care for testing (follow up with PCP or arrange E-visit). The importance of social distancing was discussed today.  Patient Risk:   After full review of this patients clinical status, I feel that they are at least moderate risk at this time.  Time:   Today, I have spent 15 minutes with the patient with telehealth technology discussing medications, BP recordings and symptoms.      Medication Adjustments/Labs and Tests Ordered: Current medicines are reviewed at length with the patient today.  Concerns regarding medicines are outlined above.  Tests Ordered: No orders of the defined types were placed in this encounter.  Medication Changes: Meds ordered this encounter  Medications  . losartan (COZAAR) 100 MG tablet    Sig: Take 1 tablet (100 mg total) by mouth daily.    Dispense:  90 tablet    Refill:  1    Disposition: 4 months. Labs in 3 months  Signed, Phill Myron. West Pugh, ANP, [facility]  02/26/2019 9:58 AM    [facility] [place], [place], [place]  [address] Phone: [phone]; Fax: [phone]

## 2019-02-26 NOTE — Patient Instructions (Signed)
Medication Instructions:  STOP NEXIUM START TUMS AS NEEDED PICK UP AND START TAKING LOSARTAN. If you need a refill on your cardiac medications before your next appointment, please call your pharmacy.  Labwork: BMET, LIPID AND LFT IN 3 MONTHS(JULY) HERE IN OUR OFFICE AT [facility]   You will need to fast. DO NOT EAT OR DRINK PAST MIDNIGHT.     Take the provided lab slips with you to the lab for your blood draw.   When you have your labs (blood work) drawn today and your tests are completely normal, you will receive your results only by MyChart Message (if you have MyChart) -OR-  A paper copy in the mail.  If you have any lab test that is abnormal or we need to change your treatment, we will call you to review these results.  Special Instructions: TAKE AND LOG YOU BLOOD PRESSURE TWICE DAILY FOR REVIEW  Follow-Up: You will need a follow up appointment in 4 months.  Please call our office 2 months in advance, June 2020 to schedule this, AUGUST 2020 appointment.  You may see Skeet Latch, MD , Jory Sims, DNP, AAC or one of the following Advanced Practice Providers on your designated Care Team:  Kerin Ransom, PA-C Roby Lofts, PA-C  Sande Rives, [place], you and your health needs are our priority.  As part of our continuing mission to provide you with exceptional heart care, we have created designated Provider Care Teams.  These Care Teams include your primary Cardiologist (physician) and Advanced Practice Providers (APPs -  Physician Assistants and Nurse Practitioners) who all work together to provide you with the care you need, when you need it.  Thank you for choosing [facility] at [facility]!!

## 2019-02-28 ENCOUNTER — Ambulatory Visit: Payer: Medicare Other | Admitting: Internal Medicine

## 2019-03-02 ENCOUNTER — Ambulatory Visit: Payer: Medicare Other | Admitting: Podiatry

## 2019-03-06 ENCOUNTER — Telehealth: Payer: Self-pay | Admitting: Cardiovascular Disease

## 2019-03-06 NOTE — Telephone Encounter (Signed)
Pt was told to keep a log of his BP and HR. He has kept a record since 04/06. Pt takes readings before and after medication  04/06: pm-161/90 HR 72 04/07: am-141/88 HR 70 ; pm-147/79, HR 79 04/08: am-141/83 HR 79; pm-139/81 HR 69 04/09: am-157/80 HR 71 ; pm-150/88 HR 68 04/10: am-140/83. HR 72 (only 1 reading) 04/11: am-142/83 HR 83 ; pm-159/88 HR 75 04/12: am-139/81 HR 69 ; pm-139/81 HR 71 04/13: am-138/79 HR 74; pm- 138/82 HR 77 04/14: am-138/81 HR 75

## 2019-03-07 ENCOUNTER — Ambulatory Visit: Payer: Medicare Other | Admitting: Podiatry

## 2019-03-09 NOTE — Telephone Encounter (Signed)
Left message to call back  

## 2019-03-09 NOTE — Telephone Encounter (Signed)
Increase carvedilol to 25mg  and keep tracking BP/HR.

## 2019-03-14 ENCOUNTER — Ambulatory Visit: Payer: Medicare Other | Admitting: Endocrinology

## 2019-03-20 NOTE — Telephone Encounter (Signed)
Left message to call back  

## 2019-03-21 NOTE — Telephone Encounter (Signed)
Follow Up: ° ° ° °Returning your call from yesterday. °

## 2019-03-21 NOTE — Telephone Encounter (Signed)
Spoke with patient and he is only taking Carvedilol 12.5 mg 1 tablet twice a day. Advised to increase to 1 and 1/2 tablet twice a day, continue to monitor and call back if no improvement. Will forward to Dr Oval Linsey for review

## 2019-03-23 NOTE — Telephone Encounter (Signed)
OK thank you 

## 2019-03-26 NOTE — Telephone Encounter (Signed)
Spoke with patient and he will call or send mychart message with updated blood pressure readings in couple of weeks

## 2019-03-27 ENCOUNTER — Telehealth: Payer: Self-pay

## 2019-03-27 NOTE — Telephone Encounter (Signed)
Overdue for an appt. LVM requesting returned call.

## 2019-03-30 ENCOUNTER — Ambulatory Visit (INDEPENDENT_AMBULATORY_CARE_PROVIDER_SITE_OTHER): Payer: Medicare Other | Admitting: Endocrinology

## 2019-03-30 ENCOUNTER — Encounter: Payer: Self-pay | Admitting: Endocrinology

## 2019-03-30 ENCOUNTER — Other Ambulatory Visit: Payer: Medicare Other

## 2019-03-30 ENCOUNTER — Ambulatory Visit (INDEPENDENT_AMBULATORY_CARE_PROVIDER_SITE_OTHER): Payer: Medicare Other

## 2019-03-30 DIAGNOSIS — E1165 Type 2 diabetes mellitus with hyperglycemia: Secondary | ICD-10-CM

## 2019-03-30 DIAGNOSIS — Z794 Long term (current) use of insulin: Secondary | ICD-10-CM | POA: Diagnosis not present

## 2019-03-30 DIAGNOSIS — IMO0001 Reserved for inherently not codable concepts without codable children: Secondary | ICD-10-CM

## 2019-03-30 DIAGNOSIS — E1140 Type 2 diabetes mellitus with diabetic neuropathy, unspecified: Secondary | ICD-10-CM

## 2019-03-30 DIAGNOSIS — E119 Type 2 diabetes mellitus without complications: Secondary | ICD-10-CM | POA: Diagnosis not present

## 2019-03-30 DIAGNOSIS — IMO0002 Reserved for concepts with insufficient information to code with codable children: Secondary | ICD-10-CM

## 2019-03-30 LAB — POCT GLYCOSYLATED HEMOGLOBIN (HGB A1C): Hemoglobin A1C: 9 % — AB (ref 4.0–5.6)

## 2019-03-30 MED ORDER — INSULIN LISPRO PROT & LISPRO (50-50 MIX) 100 UNIT/ML KWIKPEN: 25.0000 [IU] | PEN_INJECTOR | Freq: Every day | SUBCUTANEOUS | 11 refills | Status: DC

## 2019-03-30 NOTE — Progress Notes (Signed)
Subjective:    Patient ID: [name], male    DOB: 23-Feb-1951, 68 y.o.   MRN: [id number]  HPI  telehealth visit today via Phone x 5 minutes Alternatives to telehealth are presented to this patient, and the patient agrees to the telehealth visit. Pt is advised of the cost of the visit, and agrees to this, also.   Patient is at home, and I am at the office.   Persons attending the telehealth visit: the patient and I Pt returns for f/u of diabetes mellitus: DM type: Insulin-requiring type 2 Dx'ed: 1287 Complications: polyneuropathy, CAD, renal insufficiency, retinopathy, and TIA.   Therapy: insulin since 2011 DKA: never.  Severe hypoglycemia: last episode was early 2020.   Pancreatitis: never.  Other: He says he cannot use syringe and vial, due to tremor; he changed lantus to NPH, then 70/30, then 50/50, due to pattern on cbg's; he declines multiple daily injections.   Interval history: He says he never misses the insulin.  no cbg record, but states cbg's vary from 90-149.  It is highest in the afternoon, and lowest fasting.    Past Medical History:  Diagnosis Date  . ALLERGIC RHINITIS   . CAD, NATIVE VESSEL    BMS to OM1 2001, DES to BMS 2005  . Chronic back pain    herniated disc  . Chronic combined systolic and diastolic heart failure ([place]) 06/29/2016  . Constipation    takes Carafate four times day  . DIAB W/UNSPEC COMP TYPE II/UNSPEC TYPE UNCNTRL   . ERECTILE DYSFUNCTION   . GERD   . HYPERLIPIDEMIA-MIXED   . HYPERTENSION, BENIGN   . LBBB (left bundle branch block) 02/02/2016  . MORTON'S NEUROMA, RIGHT   . Peripheral neuropathy   . Seasonal allergies    takes Allegra and Benadryl daily prn;uses Flonase daily  . SHOULDER PAIN, RIGHT   . 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 TIA on medication 02/2012    Past Surgical History:  Procedure Laterality Date  . ANGIOPLASTY    . COLONOSCOPY    . CORONARY ANGIOPLASTY  2005   2 stents  .  DENTAL SURGERY    . LARYNGOPLASTY  08/07/2012   Procedure: LARYNGOPLASTY;  Surgeon: Izora Gala, MD;  Location: [place];  Service: ENT;  Laterality: Left;  Left Vocal Cord Medialyzation  . left knee surgury     x 2   . stent  2001, 2004   coronary stents    Social History   Socioeconomic History  . Marital status: Legally Separated    Spouse name: Not on file  . Number of children: 3  . Years of education: college  . Highest education level: Not on file  Occupational History  . Occupation: disabled    Employer: [employer]  Social Needs  . Financial resource strain: Not very hard  . Food insecurity:    Worry: Never true    Inability: Never true  . Transportation needs:    Medical: No    Non-medical: No  Tobacco Use  . Smoking status: Former Smoker    Last attempt to quit: 02/24/1983    Years since quitting: 36.1  . Smokeless tobacco: Never Used  Substance and Sexual Activity  . Alcohol use: No    Alcohol/week: 0.0 standard drinks  . Drug use: No  . Sexual activity: Never  Lifestyle  . Physical activity:    Days per week: 2 days    Minutes  per session: 50 min  . Stress: To some extent  Relationships  . Social connections:    Talks on phone: More than three times a week    Gets together: More than three times a week    Attends religious service: More than 4 times per year    Active member of club or organization: Yes    Attends meetings of clubs or organizations: More than 4 times per year    Relationship status: Separated  . Intimate partner violence:    Fear of current or ex partner: Not on file    Emotionally abused: Not on file    Physically abused: Not on file    Forced sexual activity: Not on file  Other Topics Concern  . Not on file  Social History Narrative       Current Outpatient Medications on File Prior to Visit  Medication Sig Dispense Refill  . atorvastatin (LIPITOR) 40 MG tablet TAKE 1 TABLET BY MOUTH ONCE DAILY .  MUST  HAVE  OFFICE  VISIT   BEFORE  FURTHER  REFILLS. 90 tablet 2  . cetirizine (ZYRTEC) 10 MG tablet Take 10 mg by mouth as needed.    . chlorthalidone (HYGROTON) 25 MG tablet Take 1 tablet (25 mg total) by mouth daily. 90 tablet 1  . clopidogrel (PLAVIX) 75 MG tablet Take 1 tablet (75 mg total) by mouth daily. 90 tablet 1  . FIBER COMPLETE PO Take 1 Scoop by mouth daily. Whole Food Fiber    . fluticasone (FLONASE) 50 MCG/ACT nasal spray Place 2 sprays into both nostrils daily. 16 g 0  . glucose blood (PRODIGY NO CODING BLOOD GLUC) test strip USE ONE STRIP TO CHECK GLUCOSE 4 TIMES DAILY 400 each 14  . Homeopathic Products (BODYANEW CLEANSE MULTIPACK) LIQD Take 1 each by mouth daily.    Marland Kitchen losartan (COZAAR) 100 MG tablet Take 1 tablet (100 mg total) by mouth daily. 90 tablet 1  . NON FORMULARY vasinox cardiovascular suppor    . NON FORMULARY Take 17 tablets by mouth daily. Glucobalance    . NON FORMULARY Take 4 Scoops by mouth daily. SP Complete Dairy Free Dietary Supplement    . OVER THE COUNTER MEDICATION Take 6 tablets by mouth daily. NeuroRenew    . PRODIGY TWIST TOP LANCETS 28G MISC USE   TO CHECK GLUCOSE 4 TIMES DAILY 400 each 1  . RELION PEN NEEDLES 32G X 4 MM MISC USE 1 ONCE DAILY AS DIRECTED  11  . senna-docusate (SENOKOT-S) 8.6-50 MG per tablet Take 2 tablets by mouth 2 (two) times daily. For constipation.    . carvedilol (COREG) 12.5 MG tablet Take 1.5 tablets (18.75 mg total) by mouth 2 (two) times daily. 270 tablet 1   No current facility-administered medications on file prior to visit.     Allergies  Allergen Reactions  . Penicillins Anaphylaxis  . Pneumococcal Vaccines Anaphylaxis  . Shellfish Allergy Anaphylaxis  . Influenza Vaccines   . Lisinopril     cough  . Topiramate Other (See Comments)    Chest spasms and numbness  . Amlodipine Other (See Comments)    LE edema    Family History  Problem Relation Age of Onset  . Cancer Mother      Review of Systems He denies hypoglycemia.     Objective:   Physical Exam      Assessment & Plan:  Insulin-requiring type 2 DM, with renal insuff: apparently well-controlled. He declines a1c now. Please come  back for a follow-up appointment in 2 months.

## 2019-03-30 NOTE — Progress Notes (Signed)
Patient here for A1c check.

## 2019-04-02 ENCOUNTER — Telehealth: Payer: Self-pay | Admitting: Endocrinology

## 2019-04-02 MED ORDER — GLUCOSE BLOOD VI STRP: 1.0000 | ORAL_STRIP | Freq: Two times a day (BID) | 12 refills | Status: DC

## 2019-04-02 MED ORDER — ONETOUCH VERIO IQ SYSTEM W/DEVICE KIT: 1.0000 | PACK | Freq: Once | 0 refills | Status: AC

## 2019-04-02 NOTE — Telephone Encounter (Signed)
1.  Please ask pt to come in to have a1c drawn, and; 2.  I need to know how cbg's are at ac and hs.

## 2019-04-02 NOTE — Telephone Encounter (Signed)
Please advise 

## 2019-04-02 NOTE — Telephone Encounter (Signed)
A1C obtained 03/30/19. Results = 9.0  Using a Prodigy meter and does not feel it is giving him accurate readings. Declined to provide with readings stating, "they aren't right." Asked if he has calibrated his device to ensure accuracy. Declined. Advised he call his insurance company to determine what device is covered. Will provide Rx's for new device and supplies to ensure accuracy of CBG's.

## 2019-04-02 NOTE — Telephone Encounter (Signed)
I have sent a prescription to your pharmacy, for a new meter and strips.  Please call in a few days to report cbg's.

## 2019-04-02 NOTE — Telephone Encounter (Signed)
Patient states that he has increased the insulin to 25 units and now his sugars have been running low. He thinks his meter is reading incorrectly.  Please Advise, Thanks

## 2019-04-04 ENCOUNTER — Telehealth: Payer: Self-pay | Admitting: Endocrinology

## 2019-04-04 NOTE — Telephone Encounter (Signed)
MEDICATION: Lantus  PHARMACY:  [facility]  IS THIS A 90 DAY SUPPLY :   IS PATIENT OUT OF MEDICATION:   IF NOT; HOW MUCH IS LEFT:   LAST APPOINTMENT DATE: @5 /09/2019  NEXT APPOINTMENT DATE:@8 /17/2020  DO WE HAVE YOUR PERMISSION TO LEAVE A DETAILED MESSAGE:  OTHER COMMENTS:  Pharmacy requesting RX sent  **Let patient know to contact pharmacy at the end of the day to make sure medication is ready. **  ** Please notify patient to allow 48-72 hours to process**  **Encourage patient to contact the pharmacy for refills or they can request refills through [facility]**

## 2019-04-04 NOTE — Telephone Encounter (Signed)
Refill is not appropriate d/t no longer being on this medication per Dr. Loanne Drilling.

## 2019-04-04 NOTE — Telephone Encounter (Signed)
Pt is no longer on this medication

## 2019-04-04 NOTE — Telephone Encounter (Signed)
I am not seeing Lantus on his medication list. Please advise about this refill request

## 2019-04-05 ENCOUNTER — Other Ambulatory Visit: Payer: Self-pay

## 2019-04-05 DIAGNOSIS — E1140 Type 2 diabetes mellitus with diabetic neuropathy, unspecified: Secondary | ICD-10-CM

## 2019-04-05 DIAGNOSIS — IMO0002 Reserved for concepts with insufficient information to code with codable children: Secondary | ICD-10-CM

## 2019-04-05 MED ORDER — GLUCOSE BLOOD VI STRP: 1.0000 | ORAL_STRIP | Freq: Two times a day (BID) | 12 refills | Status: DC

## 2019-04-05 MED ORDER — ONETOUCH DELICA LANCETS 30G MISC: 1.0000 | Freq: Two times a day (BID) | 3 refills | Status: DC

## 2019-04-08 NOTE — Progress Notes (Signed)
Virtual Visit via Video Note  I connected with [name] on 04/09/19 at 10:30 AM EDT by a video enabled telemedicine application and verified that I am speaking with the correct person using two identifiers.   I discussed the limitations of evaluation and management by telemedicine and the availability of in person appointments. The patient expressed understanding and agreed to proceed.  The patient is currently at home and I am in the office.    No referring provider.    History of Present Illness: He is here for follow up of his chronic medical conditions.     He is not exercising regularly.     Throat itching and soreness, coughing up phlegm, dizzy in forehead, sinus pressure-all of these symptoms started about one week ago.  He has had some shortness of breath on occasion.  In rare wheezing.  He is taking allegra and there has not been any improvement.  He feels the symptoms are different than his usual allergies.  He denies any fevers or chills.  Hyperlipidemia: He is taking his medication daily. He is compliant with a low fat/cholesterol diet. He denies myalgias.   CAD, chronic combined sys and diastolic HF, Hypertension: He is taking his medication daily. He is compliant with a low sodium diet.  He denies chest pain, palpitations, and regular headaches.  He does not monitor his blood pressure at home.    Diabetes: he follows with Dr Loanne Drilling.  He is taking his medication daily as prescribed. He is compliant with a diabetic diet.  His sugars are now averaging 180's - they have improved since Dr Loanne Drilling increased his insulin.    CKD, stage 3:  He drinks a good amount of water during the day.  He does not take any nsaids..   Review of Systems  Constitutional: Positive for diaphoresis (night and day). Negative for chills and fever.  HENT: Positive for sinus pain (ethmoidal) and sore throat. Negative for congestion and ear pain (pressure in right ear).        Sneezing   Respiratory: Positive for cough (productive of yellow sputum), shortness of breath (on occasion - new) and wheezing (rare).   Cardiovascular: Positive for leg swelling (left leg - chronic ). Negative for chest pain and palpitations.  Gastrointestinal: Positive for heartburn.  Neurological: Positive for dizziness and headaches.     Social History   Socioeconomic History  . Marital status: Legally Separated    Spouse name: Not on file  . Number of children: 3  . Years of education: college  . Highest education level: Not on file  Occupational History  . Occupation: disabled    Employer: [employer]  Social Needs  . Financial resource strain: Not very hard  . Food insecurity:    Worry: Never true    Inability: Never true  . Transportation needs:    Medical: No    Non-medical: No  Tobacco Use  . Smoking status: Former Smoker    Last attempt to quit: 02/24/1983    Years since quitting: 36.1  . Smokeless tobacco: Never Used  Substance and Sexual Activity  . Alcohol use: No    Alcohol/week: 0.0 standard drinks  . Drug use: No  . Sexual activity: Never  Lifestyle  . Physical activity:    Days per week: 2 days    Minutes per session: 50 min  . Stress: To some extent  Relationships  . Social connections:    Talks on phone: More than three times a  week    Gets together: More than three times a week    Attends religious service: More than 4 times per year    Active member of club or organization: Yes    Attends meetings of clubs or organizations: More than 4 times per year    Relationship status: Separated  Other Topics Concern  . Not on file  Social History Narrative        Observations/Objective: Appears well in NAD Breathing normally  Lab Results  Component Value Date   WBC 6.8 08/28/2018   HGB 11.7 (L) 08/28/2018   HCT 34.5 (L) 08/28/2018   PLT 215.0 08/28/2018   GLUCOSE 184 (H) 08/28/2018   CHOL 110 08/28/2018   TRIG 131.0 08/28/2018   HDL 49.60 08/28/2018    LDLDIRECT 63.0 01/12/2016   LDLCALC 34 08/28/2018   ALT 26 08/28/2018   AST 21 08/28/2018   NA 139 08/28/2018   K 3.6 08/28/2018   CL 103 08/28/2018   CREATININE 1.74 (H) 08/28/2018   BUN 26 (H) 08/28/2018   CO2 30 08/28/2018   TSH 1.55 02/24/2018   PSA 0.14 08/17/2016   INR 1.06 08/14/2012   HGBA1C 9.0 (A) 03/30/2019   MICROALBUR 7.5 (H) 08/17/2016    Assessment and Plan:  See Problem List for Assessment and Plan of chronic medical problems.   Follow Up Instructions:    I discussed the assessment and treatment plan with the patient. The patient was provided an opportunity to ask questions and all were answered. The patient agreed with the plan and demonstrated an understanding of the instructions.   The patient was advised to call back or seek an in-person evaluation if the symptoms worsen or if the condition fails to improve as anticipated.  FU in office in 6 months with blood work at that time  Binnie Rail, MD

## 2019-04-09 ENCOUNTER — Ambulatory Visit (INDEPENDENT_AMBULATORY_CARE_PROVIDER_SITE_OTHER): Payer: Medicare Other | Admitting: Internal Medicine

## 2019-04-09 ENCOUNTER — Encounter: Payer: Self-pay | Admitting: Internal Medicine

## 2019-04-09 DIAGNOSIS — I10 Essential (primary) hypertension: Secondary | ICD-10-CM | POA: Diagnosis not present

## 2019-04-09 DIAGNOSIS — I5042 Chronic combined systolic (congestive) and diastolic (congestive) heart failure: Secondary | ICD-10-CM

## 2019-04-09 DIAGNOSIS — IMO0001 Reserved for inherently not codable concepts without codable children: Secondary | ICD-10-CM

## 2019-04-09 DIAGNOSIS — E119 Type 2 diabetes mellitus without complications: Secondary | ICD-10-CM | POA: Diagnosis not present

## 2019-04-09 DIAGNOSIS — E7849 Other hyperlipidemia: Secondary | ICD-10-CM | POA: Diagnosis not present

## 2019-04-09 DIAGNOSIS — J0120 Acute ethmoidal sinusitis, unspecified: Secondary | ICD-10-CM

## 2019-04-09 DIAGNOSIS — N183 Chronic kidney disease, stage 3 unspecified: Secondary | ICD-10-CM

## 2019-04-09 DIAGNOSIS — Z794 Long term (current) use of insulin: Secondary | ICD-10-CM

## 2019-04-09 DIAGNOSIS — I2510 Atherosclerotic heart disease of native coronary artery without angina pectoris: Secondary | ICD-10-CM | POA: Diagnosis not present

## 2019-04-09 MED ORDER — DOXYCYCLINE HYCLATE 100 MG PO TABS: 100.0000 mg | ORAL_TABLET | Freq: Two times a day (BID) | ORAL | 0 refills | Status: DC

## 2019-04-09 NOTE — Assessment & Plan Note (Signed)
Continue statin. 

## 2019-04-09 NOTE — Assessment & Plan Note (Signed)
BP Readings from Last 3 Encounters:  02/26/19 (!) 161/85  01/16/19 (!) 160/82  12/18/18 140/70   Blood pressure has been elevated in the past Unfortunately he is not monitoring his blood pressure at home Will not change medication at this time

## 2019-04-09 NOTE — Assessment & Plan Note (Signed)
Some swelling in left lower extremity, which is chronic and mild No other symptoms of fluid overload-appears euvolemic via video Following with cardiology/heart failure clinic Continue current medications

## 2019-04-09 NOTE — Assessment & Plan Note (Signed)
GFR has been stable Continue current medications He does drink plenty of water and does not take any NSAIDs

## 2019-04-09 NOTE — Assessment & Plan Note (Signed)
Management per Dr. Pollie Friar A1c when it was last checked was 9 and his insulin dose was increased He states his sugars have improved and are now averaging 180s Has follow-up scheduled with Dr. Loanne Drilling

## 2019-04-09 NOTE — Assessment & Plan Note (Signed)
Symptoms consistent with likely sinus infection Will start doxycycline twice daily x10 days Continue over-the-counter medications for symptom relief Call if no improvement

## 2019-04-26 ENCOUNTER — Telehealth: Payer: Self-pay | Admitting: Internal Medicine

## 2019-04-26 NOTE — Telephone Encounter (Signed)
Left message for patient to call back to schedule.  °

## 2019-04-26 NOTE — Telephone Encounter (Signed)
-----   Message from Binnie Rail, MD sent at 04/09/2019 10:43 AM EDT ----- He needs a 6 mo f/u for DM, htn, ckd

## 2019-05-16 ENCOUNTER — Other Ambulatory Visit: Payer: Self-pay

## 2019-05-16 DIAGNOSIS — IMO0001 Reserved for inherently not codable concepts without codable children: Secondary | ICD-10-CM

## 2019-05-16 MED ORDER — HUMALOG MIX 50/50 KWIKPEN (50-50) 100 UNIT/ML ~~LOC~~ SUPN: 25.0000 [IU] | PEN_INJECTOR | Freq: Every day | SUBCUTANEOUS | 11 refills | Status: DC

## 2019-05-23 ENCOUNTER — Encounter: Payer: Self-pay | Admitting: Podiatry

## 2019-05-23 ENCOUNTER — Ambulatory Visit (INDEPENDENT_AMBULATORY_CARE_PROVIDER_SITE_OTHER): Payer: Medicare Other | Admitting: Podiatry

## 2019-05-23 ENCOUNTER — Other Ambulatory Visit: Payer: Self-pay

## 2019-05-23 VITALS — Temp 98.0°F

## 2019-05-23 DIAGNOSIS — Z7289 Other problems related to lifestyle: Secondary | ICD-10-CM

## 2019-05-23 DIAGNOSIS — E1142 Type 2 diabetes mellitus with diabetic polyneuropathy: Secondary | ICD-10-CM | POA: Diagnosis not present

## 2019-05-23 DIAGNOSIS — IMO0002 Reserved for concepts with insufficient information to code with codable children: Secondary | ICD-10-CM

## 2019-05-23 DIAGNOSIS — M79674 Pain in right toe(s): Secondary | ICD-10-CM | POA: Diagnosis not present

## 2019-05-23 DIAGNOSIS — B351 Tinea unguium: Secondary | ICD-10-CM

## 2019-05-23 DIAGNOSIS — M79675 Pain in left toe(s): Secondary | ICD-10-CM | POA: Diagnosis not present

## 2019-05-23 NOTE — Patient Instructions (Signed)
Diabetes Mellitus and Foot Care Foot care is an important part of your health, especially when you have diabetes. Diabetes may cause you to have problems because of poor blood flow (circulation) to your feet and legs, which can cause your skin to:  Become thinner and drier.  Break more easily.  Heal more slowly.  Peel and crack. You may also have nerve damage (neuropathy) in your legs and feet, causing decreased feeling in them. This means that you may not notice minor injuries to your feet that could lead to more serious problems. Noticing and addressing any potential problems early is the best way to prevent future foot problems. How to care for your feet Foot hygiene  Wash your feet daily with warm water and mild soap. Do not use hot water. Then, pat your feet and the areas between your toes until they are completely dry. Do not soak your feet as this can dry your skin.  Trim your toenails straight across. Do not dig under them or around the cuticle. File the edges of your nails with an emery board or nail file.  Apply a moisturizing lotion or petroleum jelly to the skin on your feet and to dry, brittle toenails. Use lotion that does not contain alcohol and is unscented. Do not apply lotion between your toes. Shoes and socks  Wear clean socks or stockings every day. Make sure they are not too tight. Do not wear knee-high stockings since they may decrease blood flow to your legs.  Wear shoes that fit properly and have enough cushioning. Always look in your shoes before you put them on to be sure there are no objects inside.  To break in new shoes, wear them for just a few hours a day. This prevents injuries on your feet. Wounds, scrapes, corns, and calluses  Check your feet daily for blisters, cuts, bruises, sores, and redness. If you cannot see the bottom of your feet, use a mirror or ask someone for help.  Do not cut corns or calluses or try to remove them with medicine.  If you  find a minor scrape, cut, or break in the skin on your feet, keep it and the skin around it clean and dry. You may clean these areas with mild soap and water. Do not clean the area with peroxide, alcohol, or iodine.  If you have a wound, scrape, corn, or callus on your foot, look at it several times a day to make sure it is healing and not infected. Check for: ? Redness, swelling, or pain. ? Fluid or blood. ? Warmth. ? Pus or a bad smell. General instructions  Do not cross your legs. This may decrease blood flow to your feet.  Do not use heating pads or hot water bottles on your feet. They may burn your skin. If you have lost feeling in your feet or legs, you may not know this is happening until it is too late.  Protect your feet from hot and cold by wearing shoes, such as at the beach or on hot pavement.  Schedule a complete foot exam at least once a year (annually) or more often if you have foot problems. If you have foot problems, report any cuts, sores, or bruises to your health care provider immediately. Contact a health care provider if:  You have a medical condition that increases your risk of infection and you have any cuts, sores, or bruises on your feet.  You have an injury that is not   healing.  You have redness on your legs or feet.  You feel burning or tingling in your legs or feet.  You have pain or cramps in your legs and feet.  Your legs or feet are numb.  Your feet always feel cold.  You have pain around a toenail. Get help right away if:  You have a wound, scrape, corn, or callus on your foot and: ? You have pain, swelling, or redness that gets worse. ? You have fluid or blood coming from the wound, scrape, corn, or callus. ? Your wound, scrape, corn, or callus feels warm to the touch. ? You have pus or a bad smell coming from the wound, scrape, corn, or callus. ? You have a fever. ? You have a red line going up your leg. Summary  Check your feet every day  for cuts, sores, red spots, swelling, and blisters.  Moisturize feet and legs daily.  Wear shoes that fit properly and have enough cushioning.  If you have foot problems, report any cuts, sores, or bruises to your health care provider immediately.  Schedule a complete foot exam at least once a year (annually) or more often if you have foot problems. This information is not intended to replace advice given to you by your health care provider. Make sure you discuss any questions you have with your health care provider. Document Released: 11/05/2000 Document Revised: 12/21/2017 Document Reviewed: 12/10/2016 Elsevier Patient Education  2020 [employer] Inc[employer]  

## 2019-05-27 NOTE — Progress Notes (Signed)
Subjective: [name] presents today with painful, thick toenails 1-5 b/l that she cannot cut and which interfere with daily activities.  Pain is aggravated when wearing enclosed shoe gear and relieved with periodic professional debridement.  [name] admits attempting to cut his toenails. He relates he cut his left 2nd toe and it bled. He has done nothing to treat it.  Mike Rail, MD is his PCP. Last visit was 04/09/2019.  He also sees Dr. Loanne Walker for Endocrinology with last visit being 03/30/2019.   Current Outpatient Medications:  .  atorvastatin (LIPITOR) 40 MG tablet, TAKE 1 TABLET BY MOUTH ONCE DAILY .  MUST  HAVE  OFFICE  VISIT  BEFORE  FURTHER  REFILLS., Disp: 90 tablet, Rfl: 2 .  Blood Glucose Monitoring Suppl (ONETOUCH VERIO) w/Device KIT, USE METER TO CHECK GLUCOSE ONCE DAILY AS DIRECTED, Disp: , Rfl:  .  cetirizine (ZYRTEC) 10 MG tablet, Take 10 mg by mouth as needed., Disp: , Rfl:  .  chlorthalidone (HYGROTON) 25 MG tablet, Take 1 tablet (25 mg total) by mouth daily., Disp: 90 tablet, Rfl: 1 .  clopidogrel (PLAVIX) 75 MG tablet, Take 1 tablet (75 mg total) by mouth daily., Disp: 90 tablet, Rfl: 1 .  doxycycline (VIBRA-TABS) 100 MG tablet, Take 1 tablet (100 mg total) by mouth 2 (two) times daily., Disp: 20 tablet, Rfl: 0 .  FIBER COMPLETE PO, Take 1 Scoop by mouth daily. Whole Food Fiber, Disp: , Rfl:  .  fluticasone (FLONASE) 50 MCG/ACT nasal spray, Place 2 sprays into both nostrils daily., Disp: 16 g, Rfl: 0 .  glucose blood (ONETOUCH VERIO) test strip, 1 each by Other route 2 (two) times daily. Use to monitor glucose levels BID; E11.9, Disp: 100 each, Rfl: 12 .  Homeopathic Products (BODYANEW CLEANSE MULTIPACK) LIQD, Take 1 each by mouth daily., Disp: , Rfl:  .  Insulin Lispro Prot & Lispro (HUMALOG MIX 50/50 KWIKPEN) (50-50) 100 UNIT/ML Kwikpen, Inject 25 Units into the skin daily with breakfast. And pen needles 1/day, Disp: 30 mL, Rfl: 11 .  losartan (COZAAR) 100  MG tablet, Take 1 tablet (100 mg total) by mouth daily., Disp: 90 tablet, Rfl: 1 .  NON FORMULARY, vasinox cardiovascular suppor, Disp: , Rfl:  .  NON FORMULARY, Take 17 tablets by mouth daily. Glucobalance, Disp: , Rfl:  .  NON FORMULARY, Take 4 Scoops by mouth daily. SP Complete Dairy Free Dietary Supplement, Disp: , Rfl:  .  OneTouch Delica Lancets 64W MISC, 1 each by Does not apply route 2 (two) times a day. Use to monitor glucose levels BID; E11.9, Disp: 100 each, Rfl: 3 .  OVER THE COUNTER MEDICATION, Take 6 tablets by mouth daily. NeuroRenew, Disp: , Rfl:  .  RELION PEN NEEDLES 32G X 4 MM MISC, USE 1 ONCE DAILY AS DIRECTED, Disp: , Rfl: 11 .  senna-docusate (SENOKOT-S) 8.6-50 MG per tablet, Take 2 tablets by mouth 2 (two) times daily. For constipation., Disp: , Rfl:  .  carvedilol (COREG) 12.5 MG tablet, Take 1.5 tablets (18.75 mg total) by mouth 2 (two) times daily., Disp: 270 tablet, Rfl: 1  Allergies  Allergen Reactions  . Penicillins Anaphylaxis  . Pneumococcal Vaccines Anaphylaxis  . Shellfish Allergy Anaphylaxis  . Influenza Vaccines   . Lisinopril     cough  . Topiramate Other (See Comments)    Chest spasms and numbness  . Amlodipine Other (See Comments)    LE edema    Objective: Vitals:   05/23/19  0944  Temp: 98 F (36.7 C)    Vascular Examination: Capillary refill time <4 seconds  x 10 digits.  Dorsalis pedis and Posterior tibial pulses 2/4 b/l.  Digital hair absent x 10 digits.  Skin temperature gradient WNL b/l.  Dermatological Examination: Skin is noted to be thin and atrophic b/l.  Laceration noted distal edge of nailplate left 2nd digit consistent with patient's history of attempted nail trimming. No erythema, no edema, no drainage, no flocculence.  Toenails 1-5 b/l discolored, thick, dystrophic with subungual debris and pain with palpation to nailbeds due to thickness of nails.  Musculoskeletal: Muscle strength 5/5 to all LE muscle  groups.  Hammertoe 2nd digit b/l  No pain, crepitus or joint limitation noted with ROM.   Neurological: Sensation intact with 10 gram monofilament.  Vibratory sensation intact right, absent LLE.  Assessment: Painful onychomycosis toenails 1-5 b/l   Self-inflicted laceration left 2nd toe NIDDM with neuropathy  Plan: 1. [name] was educated on the dangers of self-trimming and instructed to avoid self-trimming of toenails. He agrees to comply. Patient's left 2nd toe is not infected, but he does have tender, superficial laceration. Laceration cleansed with wound cleanser and triple antibiotic ointment applied. He was instructed to apply Neosporin Cream to left 2nd toe once daily until healed.Call office if it does not improve. 2. Toenails 1-5 b/l were debrided in length and girth without iatrogenic bleeding. 3. Patient to continue soft, supportive shoe gear daily. 4. Patient to report any pedal injuries to medical professional immediately. 5. Follow up 3 months.  6. Patient/POA to call should there be a concern in the interim.

## 2019-07-05 ENCOUNTER — Other Ambulatory Visit: Payer: Self-pay

## 2019-07-06 ENCOUNTER — Other Ambulatory Visit: Payer: Self-pay | Admitting: Adult Health

## 2019-07-09 ENCOUNTER — Telehealth: Payer: Self-pay

## 2019-07-09 ENCOUNTER — Other Ambulatory Visit: Payer: Self-pay

## 2019-07-09 ENCOUNTER — Ambulatory Visit (INDEPENDENT_AMBULATORY_CARE_PROVIDER_SITE_OTHER): Payer: Medicare Other | Admitting: Endocrinology

## 2019-07-09 ENCOUNTER — Encounter: Payer: Self-pay | Admitting: Endocrinology

## 2019-07-09 VITALS — BP 130/72 | HR 70 | Ht 71.0 in | Wt 214.2 lb

## 2019-07-09 DIAGNOSIS — IMO0001 Reserved for inherently not codable concepts without codable children: Secondary | ICD-10-CM

## 2019-07-09 DIAGNOSIS — E119 Type 2 diabetes mellitus without complications: Secondary | ICD-10-CM

## 2019-07-09 DIAGNOSIS — I2510 Atherosclerotic heart disease of native coronary artery without angina pectoris: Secondary | ICD-10-CM | POA: Diagnosis not present

## 2019-07-09 DIAGNOSIS — Z794 Long term (current) use of insulin: Secondary | ICD-10-CM | POA: Diagnosis not present

## 2019-07-09 LAB — POCT GLYCOSYLATED HEMOGLOBIN (HGB A1C): Hemoglobin A1C: 7.7 % — AB (ref 4.0–5.6)

## 2019-07-09 NOTE — Telephone Encounter (Signed)
Yes, he needs an appointment.  We do not just order x-rays for other people

## 2019-07-09 NOTE — Telephone Encounter (Signed)
Copied from [place] [id number]. Topic: General - Other >> Jul 09, 2019  3:02 PM Yvette Rack wrote: Reason for CRM: Pt stated he has a Rx from his chiropractor to get a x-ray and he would like to schedule an appt. Pt stated he was told to take the Rx to his pcp office for scheduling. Attempted to transfer pt to the office but there was no answer. Pt requests call back.

## 2019-07-09 NOTE — Patient Instructions (Addendum)
Please continue the same insulin On this type of insulin schedule, you should eat meals on a regular schedule (especially breakfast and lunch).  If a meal is missed or significantly delayed, your blood sugar could go low.   Please come back for a follow-up appointment in 3 months.  check your blood sugar twice a day.  vary the time of day when you check, between before the 3 meals, and at bedtime.  also check if you have symptoms of your blood sugar being too high or too low.  please keep a record of the readings and bring it to your next appointment here (or you can bring the meter itself).  You can write it on any piece of paper.  please call us sooner if your blood sugar goes below 70, or if you have a lot of readings over 200.

## 2019-07-09 NOTE — Progress Notes (Signed)
Subjective:    Patient ID: [name], male    DOB: 06/02/1951, 68 y.o.   MRN: [id number]  HPI Pt returns for f/u of diabetes mellitus: DM type: Insulin-requiring type 2 Dx'ed: 1660 Complications: polyneuropathy, CAD, renal insufficiency, retinopathy, and TIA.   Therapy: insulin since 2011 DKA: never.   Severe hypoglycemia: last episode was early 2020.   Pancreatitis: never.  Other: He says he cannot use syringe and vial, due to tremor; he changed lantus to NPH, then 70/30, then 50/50, due to pattern on cbg's; he declines multiple daily injections.   Interval history: He says he never misses the insulin.  Meter is downloaded today, and the printout is scanned into the record.  cbg's vary from 144-237.  However, pt says cbg has been as low as 69.  All are checked from 8 AM-1 PM.   Past Medical History:  Diagnosis Date   ALLERGIC RHINITIS    CAD, NATIVE VESSEL    BMS to [place] 2001, DES to BMS 2005   Chronic back pain    herniated disc   Chronic combined systolic and diastolic heart failure ([place]) 06/29/2016   Constipation    takes Carafate four times day   DIAB W/UNSPEC COMP TYPE II/UNSPEC TYPE UNCNTRL    ERECTILE DYSFUNCTION    GERD    HYPERLIPIDEMIA-MIXED    HYPERTENSION, BENIGN    LBBB (left bundle branch block) 02/02/2016   MORTON'S NEUROMA, RIGHT    Peripheral neuropathy    Seasonal allergies    takes Allegra and Benadryl daily prn;uses Flonase daily   SHOULDER PAIN, RIGHT    Stroke, 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   L HP + hemiparesis, s/p CIR    TIA on medication 02/2012    Past Surgical History:  Procedure Laterality Date   ANGIOPLASTY     COLONOSCOPY     CORONARY ANGIOPLASTY  2005   2 stents   DENTAL SURGERY     LARYNGOPLASTY  08/07/2012   Procedure: LARYNGOPLASTY;  Surgeon: Izora Gala, MD;  Location: [facility];  Service: ENT;  Laterality: Left;  Left Vocal Cord Medialyzation   left knee surgury     x 2    stent  2001, 2004   coronary stents     Social History   Socioeconomic History   Marital status: Legally Separated    Spouse name: Not on file   Number of children: 3   Years of education: college   Highest education level: Not on file  Occupational History   Occupation: disabled    Fish farm manager: [employer]  Social Designer, fashion/clothing strain: Not very hard   Food insecurity    Worry: Never true    Inability: Never true   Transportation needs    Medical: No    Non-medical: No  Tobacco Use   Smoking status: Former Smoker    Quit date: 02/24/1983    Years since quitting: 36.3   Smokeless tobacco: Never Used  Substance and Sexual Activity   Alcohol use: No    Alcohol/week: 0.0 standard drinks   Drug use: No   Sexual activity: Never  Lifestyle   Physical activity    Days per week: 2 days    Minutes per session: 50 min   Stress: To some extent  Relationships   Social connections    Talks on phone: More than three times a week    Gets together: More than three times a week    Attends religious service: More than  4 times per year    Active member of club or organization: Yes    Attends meetings of clubs or organizations: More than 4 times per year    Relationship status: Separated   Intimate partner violence    Fear of current or ex partner: Not on file    Emotionally abused: Not on file    Physically abused: Not on file    Forced sexual activity: Not on file  Other Topics Concern   Not on file  Social History Narrative       Current Outpatient Medications on File Prior to Visit  Medication Sig Dispense Refill   atorvastatin (LIPITOR) 40 MG tablet TAKE 1 TABLET BY MOUTH ONCE DAILY .  MUST  HAVE  OFFICE  VISIT  BEFORE  FURTHER  REFILLS. 90 tablet 2   Blood Glucose Monitoring Suppl (ONETOUCH VERIO) w/Device KIT USE METER TO CHECK GLUCOSE ONCE DAILY AS DIRECTED     cetirizine (ZYRTEC) 10 MG tablet Take 10 mg by mouth as needed.     chlorthalidone (HYGROTON) 25 MG tablet Take 1  tablet by mouth once daily 90 tablet 1   clopidogrel (PLAVIX) 75 MG tablet Take 1 tablet (75 mg total) by mouth daily. 90 tablet 1   FIBER COMPLETE PO Take 1 Scoop by mouth daily. Whole Food Fiber     fluticasone (FLONASE) 50 MCG/ACT nasal spray Place 2 sprays into both nostrils daily. 16 g 0   glucose blood (ONETOUCH VERIO) test strip 1 each by Other route 2 (two) times daily. Use to monitor glucose levels BID; E11.9 100 each 12   Homeopathic Products (BODYANEW CLEANSE MULTIPACK) LIQD Take 1 each by mouth daily.     Insulin Lispro Prot & Lispro (HUMALOG MIX 50/50 KWIKPEN) (50-50) 100 UNIT/ML Kwikpen Inject 25 Units into the skin daily with breakfast. And pen needles 1/day 30 mL 11   losartan (COZAAR) 100 MG tablet Take 1 tablet (100 mg total) by mouth daily. 90 tablet 1   NON FORMULARY vasinox cardiovascular suppor     NON FORMULARY Take 17 tablets by mouth daily. Glucobalance     NON FORMULARY Take 4 Scoops by mouth daily. SP Complete Dairy Free Dietary Supplement     OneTouch Delica Lancets 56L MISC 1 each by Does not apply route 2 (two) times a day. Use to monitor glucose levels BID; E11.9 100 each 3   OVER THE COUNTER MEDICATION Take 6 tablets by mouth daily. NeuroRenew     RELION PEN NEEDLES 32G X 4 MM MISC USE 1 ONCE DAILY AS DIRECTED  11   senna-docusate (SENOKOT-S) 8.6-50 MG per tablet Take 2 tablets by mouth 2 (two) times daily. For constipation.     carvedilol (COREG) 12.5 MG tablet Take 1.5 tablets (18.75 mg total) by mouth 2 (two) times daily. 270 tablet 1   No current facility-administered medications on file prior to visit.     Allergies  Allergen Reactions   Penicillins Anaphylaxis   Pneumococcal Vaccines Anaphylaxis   Shellfish Allergy Anaphylaxis   Influenza Vaccines    Lisinopril     cough   Topiramate Other (See Comments)    Chest spasms and numbness   Amlodipine Other (See Comments)    LE edema    Family History  Problem Relation Age of  Onset   Cancer Mother     BP 130/72 (BP Location: Left Arm, Patient Position: Sitting, Cuff Size: Large)    Pulse 70    Ht _0  (  1.803 m)    Wt 214 lb 3.2 oz (97.2 kg)    SpO2 96%    BMI 29.87 kg/m   Review of Systems Denies LOC.      Objective:   Physical Exam VITAL SIGNS:  See vs page GENERAL: no distress.   Pulses: dorsalis pedis intact bilat.   MSK: no deformity of the feet CV: no leg edema Skin:  no ulcer on the feet, but the skin is dry.  normal color and temp on the feet.  Neuro: sensation is intact to touch on the feet, but decreased from normal.    Lab Results  Component Value Date   HGBA1C 7.7 (A) 07/09/2019   Lab Results  Component Value Date   CREATININE 1.74 (H) 08/28/2018   BUN 26 (H) 08/28/2018   NA 139 08/28/2018   K 3.6 08/28/2018   CL 103 08/28/2018   CO2 30 08/28/2018       Assessment & Plan:  Insulin-requiring type 2 DM, with CAD: this is the best control this pt should aim for, given this regimen, which does match insulin to her changing needs throughout the day Renal insuff: in this setting, pt does not need a PM insulin dose. Hypoglycemia: this limits aggressiveness of glycemic control   Patient Instructions  Please continue the same insulin On this type of insulin schedule, you should eat meals on a regular schedule (especially breakfast and lunch).  If a meal is missed or significantly delayed, your blood sugar could go low.   Please come back for a follow-up appointment in 3 months.  check your blood sugar twice a day.  vary the time of day when you check, between before the 3 meals, and at bedtime.  also check if you have symptoms of your blood sugar being too high or too low.  please keep a record of the readings and bring it to your next appointment here (or you can bring the meter itself).  You can write it on any piece of paper.  please call us sooner if your blood sugar goes below 70, or if you have a lot of readings over 200.

## 2019-07-09 NOTE — Telephone Encounter (Signed)
Does this need a visit before going to get an xray?

## 2019-07-10 NOTE — Telephone Encounter (Signed)
Tried to call patient to inform. No answer and voicemail box was full.

## 2019-07-10 NOTE — Progress Notes (Signed)
Virtual Visit via Video Note  I connected with [name] on 07/11/19 at  9:45 AM EDT by a video enabled telemedicine application and verified that I am speaking with the correct person using two identifiers.   I discussed the limitations of evaluation and management by telemedicine and the availability of in person appointments. The patient expressed understanding and agreed to proceed.  The patient is currently at home and I am in the office.    No referring provider.    History of Present Illness: This is an acute visit for cough, congestion, sweats.  He also wanted to talk about left hip and lower back pain.  His cold symptoms started 5 days ago.  He states diaphoresis, congestion, right ear pain, sore throat, cough with minimal sputum production at times, wheezing, constipation now, but he did have diarrhea when his symptoms first started on localized body aches related to his left back and hip pain.  He has had some mild lightheadedness at times.  He denies any known fever, shortness of breath or headaches.  1 of his grandkids did have some cold symptoms and he was in contact with them, but they are feeling better.  He denies any known COVID exposure.    Left lower back, hip and left leg pain.  He has been experiencing this for a while and is seeing a Restaurant manager, fast food.  The chiropractor wanted him to have x-rays to evaluate further and he did order it, but he is not able to have it done here and he was wondering if I would order it for him.  Chiropractor wanted a lumbar x-ray done.   Review of Systems  Constitutional: Positive for diaphoresis. Negative for chills and fever.  HENT: Positive for congestion, ear pain (right ear) and sore throat.        Decreased smell , No change in taste, positive PND   Respiratory: Positive for cough, sputum production (minimal) and wheezing. Negative for shortness of breath.   Cardiovascular: Negative for chest pain.  Gastrointestinal: Positive for  constipation (for last 3 days) and diarrhea (when symptoms first started).  Musculoskeletal: Positive for myalgias (left side).  Neurological: Negative for dizziness and headaches.       Mild lightheadedness at times     Social History   Socioeconomic History  . Marital status: Legally Separated    Spouse name: Not on file  . Number of children: 3  . Years of education: college  . Highest education level: Not on file  Occupational History  . Occupation: disabled    Employer: [employer]  Social Needs  . Financial resource strain: Not very hard  . Food insecurity    Worry: Never true    Inability: Never true  . Transportation needs    Medical: No    Non-medical: No  Tobacco Use  . Smoking status: Former Smoker    Quit date: 02/24/1983    Years since quitting: 36.4  . Smokeless tobacco: Never Used  Substance and Sexual Activity  . Alcohol use: No    Alcohol/week: 0.0 standard drinks  . Drug use: No  . Sexual activity: Never  Lifestyle  . Physical activity    Days per week: 2 days    Minutes per session: 50 min  . Stress: To some extent  Relationships  . Social connections    Talks on phone: More than three times a week    Gets together: More than three times a week    Attends religious  service: More than 4 times per year    Active member of club or organization: Yes    Attends meetings of clubs or organizations: More than 4 times per year    Relationship status: Separated  Other Topics Concern  . Not on file  Social History Narrative        Observations/Objective: Appears well in NAD Breathing normally  Assessment and Plan:  See Problem List for Assessment and Plan of chronic medical problems.   Follow Up Instructions:    I discussed the assessment and treatment plan with the patient. The patient was provided an opportunity to ask questions and all were answered. The patient agreed with the plan and demonstrated an understanding of the instructions.    The patient was advised to call back or seek an in-person evaluation if the symptoms worsen or if the condition fails to improve as anticipated.    Binnie Rail, MD

## 2019-07-11 ENCOUNTER — Ambulatory Visit (INDEPENDENT_AMBULATORY_CARE_PROVIDER_SITE_OTHER): Payer: Medicare Other | Admitting: Internal Medicine

## 2019-07-11 ENCOUNTER — Encounter: Payer: Self-pay | Admitting: Internal Medicine

## 2019-07-11 ENCOUNTER — Other Ambulatory Visit: Payer: Self-pay

## 2019-07-11 DIAGNOSIS — M5432 Sciatica, left side: Secondary | ICD-10-CM | POA: Insufficient documentation

## 2019-07-11 DIAGNOSIS — R05 Cough: Secondary | ICD-10-CM | POA: Diagnosis not present

## 2019-07-11 DIAGNOSIS — Z20822 Contact with and (suspected) exposure to covid-19: Secondary | ICD-10-CM

## 2019-07-11 DIAGNOSIS — R059 Cough, unspecified: Secondary | ICD-10-CM

## 2019-07-11 DIAGNOSIS — I2510 Atherosclerotic heart disease of native coronary artery without angina pectoris: Secondary | ICD-10-CM

## 2019-07-11 NOTE — Assessment & Plan Note (Signed)
He is having 5 days of cold symptoms including cough, congestion, right ear pain, sore throat, minimal sputum production, wheezing, mild lightheadedness and diaphoresis No obvious COVID exposure Grandkids were sick with what looks like a typical cold We will test for COVID-ordered Depending on results and his symptoms we can discuss further treatment, but for now his symptoms do sound viral in nature and I would recommend only symptomatic treatment

## 2019-07-11 NOTE — Assessment & Plan Note (Signed)
Having left lower back, hip and leg pain Following with a chiropractor who would like him to have x-ray done of the lower back I will order so he can come here and have it done and then he will follow-up with a chiropractor Advised that he cannot have the x-ray done until his COVID test comes back negative, which he understands

## 2019-07-12 ENCOUNTER — Encounter: Payer: Self-pay | Admitting: Internal Medicine

## 2019-07-12 LAB — NOVEL CORONAVIRUS, NAA: SARS-CoV-2, NAA: NOT DETECTED

## 2019-07-13 ENCOUNTER — Other Ambulatory Visit: Payer: Self-pay

## 2019-07-13 ENCOUNTER — Ambulatory Visit (INDEPENDENT_AMBULATORY_CARE_PROVIDER_SITE_OTHER)
Admission: RE | Admit: 2019-07-13 | Discharge: 2019-07-13 | Disposition: A | Discharge status: Home / Self Care | Payer: Medicare Other | Source: Ambulatory Visit | Attending: Internal Medicine | Admitting: Internal Medicine

## 2019-07-13 DIAGNOSIS — M5432 Sciatica, left side: Secondary | ICD-10-CM

## 2019-07-14 ENCOUNTER — Encounter: Payer: Self-pay | Admitting: Internal Medicine

## 2019-08-23 ENCOUNTER — Other Ambulatory Visit: Payer: Self-pay | Admitting: Adult Health

## 2019-08-24 ENCOUNTER — Encounter: Payer: Self-pay | Admitting: Podiatry

## 2019-08-24 ENCOUNTER — Other Ambulatory Visit: Payer: Self-pay

## 2019-08-24 ENCOUNTER — Ambulatory Visit (INDEPENDENT_AMBULATORY_CARE_PROVIDER_SITE_OTHER): Payer: Medicare Other | Admitting: Podiatry

## 2019-08-24 DIAGNOSIS — M79674 Pain in right toe(s): Secondary | ICD-10-CM

## 2019-08-24 DIAGNOSIS — M79675 Pain in left toe(s): Secondary | ICD-10-CM | POA: Diagnosis not present

## 2019-08-24 DIAGNOSIS — B351 Tinea unguium: Secondary | ICD-10-CM | POA: Diagnosis not present

## 2019-08-24 DIAGNOSIS — E1142 Type 2 diabetes mellitus with diabetic polyneuropathy: Secondary | ICD-10-CM | POA: Diagnosis not present

## 2019-08-24 DIAGNOSIS — M792 Neuralgia and neuritis, unspecified: Secondary | ICD-10-CM

## 2019-08-24 MED ORDER — NONFORMULARY OR COMPOUNDED ITEM: 3 refills | Status: DC

## 2019-08-24 NOTE — Patient Instructions (Signed)
Diabetes Mellitus and Foot Care Foot care is an important part of your health, especially when you have diabetes. Diabetes may cause you to have problems because of poor blood flow (circulation) to your feet and legs, which can cause your skin to:  Become thinner and drier.  Break more easily.  Heal more slowly.  Peel and crack. You may also have nerve damage (neuropathy) in your legs and feet, causing decreased feeling in them. This means that you may not notice minor injuries to your feet that could lead to more serious problems. Noticing and addressing any potential problems early is the best way to prevent future foot problems. How to care for your feet Foot hygiene  Wash your feet daily with warm water and mild soap. Do not use hot water. Then, pat your feet and the areas between your toes until they are completely dry. Do not soak your feet as this can dry your skin.  Trim your toenails straight across. Do not dig under them or around the cuticle. File the edges of your nails with an emery board or nail file.  Apply a moisturizing lotion or petroleum jelly to the skin on your feet and to dry, brittle toenails. Use lotion that does not contain alcohol and is unscented. Do not apply lotion between your toes. Shoes and socks  Wear clean socks or stockings every day. Make sure they are not too tight. Do not wear knee-high stockings since they may decrease blood flow to your legs.  Wear shoes that fit properly and have enough cushioning. Always look in your shoes before you put them on to be sure there are no objects inside.  To break in new shoes, wear them for just a few hours a day. This prevents injuries on your feet. Wounds, scrapes, corns, and calluses  Check your feet daily for blisters, cuts, bruises, sores, and redness. If you cannot see the bottom of your feet, use a mirror or ask someone for help.  Do not cut corns or calluses or try to remove them with medicine.  If you  find a minor scrape, cut, or break in the skin on your feet, keep it and the skin around it clean and dry. You may clean these areas with mild soap and water. Do not clean the area with peroxide, alcohol, or iodine.  If you have a wound, scrape, corn, or callus on your foot, look at it several times a day to make sure it is healing and not infected. Check for: ? Redness, swelling, or pain. ? Fluid or blood. ? Warmth. ? Pus or a bad smell. General instructions  Do not cross your legs. This may decrease blood flow to your feet.  Do not use heating pads or hot water bottles on your feet. They may burn your skin. If you have lost feeling in your feet or legs, you may not know this is happening until it is too late.  Protect your feet from hot and cold by wearing shoes, such as at the beach or on hot pavement.  Schedule a complete foot exam at least once a year (annually) or more often if you have foot problems. If you have foot problems, report any cuts, sores, or bruises to your health care provider immediately. Contact a health care provider if:  You have a medical condition that increases your risk of infection and you have any cuts, sores, or bruises on your feet.  You have an injury that is not   healing.  You have redness on your legs or feet.  You feel burning or tingling in your legs or feet.  You have pain or cramps in your legs and feet.  Your legs or feet are numb.  Your feet always feel cold.  You have pain around a toenail. Get help right away if:  You have a wound, scrape, corn, or callus on your foot and: ? You have pain, swelling, or redness that gets worse. ? You have fluid or blood coming from the wound, scrape, corn, or callus. ? Your wound, scrape, corn, or callus feels warm to the touch. ? You have pus or a bad smell coming from the wound, scrape, corn, or callus. ? You have a fever. ? You have a red line going up your leg. Summary  Check your feet every day  for cuts, sores, red spots, swelling, and blisters.  Moisturize feet and legs daily.  Wear shoes that fit properly and have enough cushioning.  If you have foot problems, report any cuts, sores, or bruises to your health care provider immediately.  Schedule a complete foot exam at least once a year (annually) or more often if you have foot problems. This information is not intended to replace advice given to you by your health care provider. Make sure you discuss any questions you have with your health care provider. Document Released: 11/05/2000 Document Revised: 12/21/2017 Document Reviewed: 12/10/2016 Elsevier Patient Education  2020 [employer]   Onychomycosis/Fungal Toenails  WHAT IS IT? An infection that lies within the keratin of your nail plate that is caused by a fungus.  WHY ME? Fungal infections affect all ages, sexes, races, and creeds.  There may be many factors that predispose you to a fungal infection such as age, coexisting medical conditions such as diabetes, or an autoimmune disease; stress, medications, fatigue, genetics, etc.  Bottom line: fungus thrives in a warm, moist environment and your shoes offer such a location.  IS IT CONTAGIOUS? Theoretically, yes.  You do not want to share shoes, nail clippers or files with someone who has fungal toenails.  Walking around barefoot in the same room or sleeping in the same bed is unlikely to transfer the organism.  It is important to realize, however, that fungus can spread easily from one nail to the next on the same foot.  HOW DO WE TREAT THIS?  There are several ways to treat this condition.  Treatment may depend on many factors such as age, medications, pregnancy, liver and kidney conditions, etc.  It is best to ask your doctor which options are available to you.  1. No treatment.   Unlike many other medical concerns, you can live with this condition.  However for many people this can be a painful condition and may lead to  ingrown toenails or a bacterial infection.  It is recommended that you keep the nails cut short to help reduce the amount of fungal nail. 2. Topical treatment.  These range from herbal remedies to prescription strength nail lacquers.  About 40-50% effective, topicals require twice daily application for approximately 9 to 12 months or until an entirely new nail has grown out.  The most effective topicals are medical grade medications available through physicians offices. 3. Oral antifungal medications.  With an 80-90% cure rate, the most common oral medication requires 3 to 4 months of therapy and stays in your system for a year as the new nail grows out.  Oral antifungal medications do require   blood work to make sure it is a safe drug for you.  A liver function panel will be performed prior to starting the medication and after the first month of treatment.  It is important to have the blood work performed to avoid any harmful side effects.  In general, this medication safe but blood work is required. 4. Laser Therapy.  This treatment is performed by applying a specialized laser to the affected nail plate.  This therapy is noninvasive, fast, and non-painful.  It is not covered by insurance and is therefore, out of pocket.  The results have been very good with a 80-95% cure rate.  The [facility] is the only practice in the area to offer this therapy. 5. Permanent Nail Avulsion.  Removing the entire nail so that a new nail will not grow back. 

## 2019-08-26 NOTE — Progress Notes (Addendum)
Subjective: [name] is seen today for follow up painful, elongated, thickened toenails 1-5 b/l feet that he cannot cut. Pain interferes with daily activities. Aggravating factor includes wearing enclosed shoe gear and relieved with periodic debridement.  Today, pt c/o numbness, tingling, burning left foot. Has been occurring for while. Occurs mostly at night.  Current Outpatient Medications on File Prior to Visit  Medication Sig  . atorvastatin (LIPITOR) 40 MG tablet TAKE 1 TABLET BY MOUTH ONCE DAILY .  MUST  HAVE  OFFICE  VISIT  BEFORE  FURTHER  REFILLS.  Marland Kitchen Blood Glucose Monitoring Suppl (ONETOUCH VERIO) w/Device KIT USE METER TO CHECK GLUCOSE ONCE DAILY AS DIRECTED  . carvedilol (COREG) 12.5 MG tablet Take 1.5 tablets (18.75 mg total) by mouth 2 (two) times daily.  . cetirizine (ZYRTEC) 10 MG tablet Take 10 mg by mouth as needed.  . chlorthalidone (HYGROTON) 25 MG tablet Take 1 tablet by mouth once daily  . clopidogrel (PLAVIX) 75 MG tablet Take 1 tablet (75 mg total) by mouth daily.  Marland Kitchen FIBER COMPLETE PO Take 1 Scoop by mouth daily. Whole Food Fiber  . fluticasone (FLONASE) 50 MCG/ACT nasal spray Place 2 sprays into both nostrils daily.  Marland Kitchen glucose blood (ONETOUCH VERIO) test strip 1 each by Other route 2 (two) times daily. Use to monitor glucose levels BID; E11.9  . Homeopathic Products (BODYANEW CLEANSE MULTIPACK) LIQD Take 1 each by mouth daily.  . Insulin Lispro Prot & Lispro (HUMALOG MIX 50/50 KWIKPEN) (50-50) 100 UNIT/ML Kwikpen Inject 25 Units into the skin daily with breakfast. And pen needles 1/day  . losartan (COZAAR) 100 MG tablet Take 1 tablet (100 mg total) by mouth daily.  . NON FORMULARY vasinox cardiovascular suppor  . NON FORMULARY Take 17 tablets by mouth daily. Glucobalance  . NON FORMULARY Take 4 Scoops by mouth daily. SP Complete Dairy Free Dietary Supplement  . OneTouch Delica Lancets 25Z MISC 1 each by Does not apply route 2 (two) times a day. Use to monitor  glucose levels BID; E11.9  . OVER THE COUNTER MEDICATION Take 6 tablets by mouth daily. NeuroRenew  . RELION PEN NEEDLES 32G X 4 MM MISC USE 1 ONCE DAILY AS DIRECTED  . senna-docusate (SENOKOT-S) 8.6-50 MG per tablet Take 2 tablets by mouth 2 (two) times daily. For constipation.   No current facility-administered medications on file prior to visit.      Allergies  Allergen Reactions  . Penicillins Anaphylaxis  . Pneumococcal Vaccines Anaphylaxis  . Shellfish Allergy Anaphylaxis  . Influenza Vaccines   . Lisinopril     cough  . Topiramate Other (See Comments)    Chest spasms and numbness  . Amlodipine Other (See Comments)    LE edema  Objective:  Vascular Examination: Capillary refill time <4 seconds x 10 digits.  Dorsalis pedis present b/l.  Posterior tibial pulses present b/l.  Digital hair absent x 10 digits.  Skin temperature gradient WNL b/l.   Dermatological Examination: Skin with normal turgor, texture and tone b/l.  Toenails 1-5 b/l discolored, thick, dystrophic with subungual debris and pain with palpation to nailbeds due to thickness of nails.  Musculoskeletal: Muscle strength 5/5 to all LE muscle groups.  Hammertoe 2nd digit b/l.  No pain, crepitus or joint limitation noted with ROM.   Neurological Examination: Protective sensation intact 5/5 b/l with 10 gram monofilament bilaterally.  Epicritic sensation present bilaterally.  Vibratory sensation intact right, absent LLE.  Assessment: Painful onychomycosis toenails 1-5 b/l  Neuropathic pain  NIDDM with neuropathy  Plan: 1. Toenails 1-5 b/l were debrided in length and girth without iatrogenic bleeding.  2. Discussed treatment options for neuropathic pain. Rx sent to [facility] for neuropathy cream to be applied to left foot every 8 hours. 3. Patient to continue soft, supportive shoe gear. 4. Patient to report any pedal injuries to medical professional immediately. 5. Follow up 3 months.   6. Patient/POA to call should there be a concern in the interim.  Canceled Rx to [place] due to patient being allergic to one of the ingredients. Called patient and informed him. Advised him to purchase Capsaicin Cream and follow manufacturers directions for dosage.

## 2019-08-27 NOTE — Addendum Note (Signed)
Addended by: Marzetta Board on: 08/27/2019 10:10 AM   Modules accepted: Orders

## 2019-09-18 ENCOUNTER — Other Ambulatory Visit: Payer: Self-pay

## 2019-09-18 DIAGNOSIS — IMO0002 Reserved for concepts with insufficient information to code with codable children: Secondary | ICD-10-CM

## 2019-09-18 DIAGNOSIS — E1140 Type 2 diabetes mellitus with diabetic neuropathy, unspecified: Secondary | ICD-10-CM

## 2019-09-18 MED ORDER — ONETOUCH DELICA LANCETS 30G MISC: 1.0000 | Freq: Two times a day (BID) | 3 refills | Status: DC

## 2019-09-23 ENCOUNTER — Other Ambulatory Visit: Payer: Self-pay | Admitting: Adult Health

## 2019-10-09 ENCOUNTER — Encounter: Payer: Self-pay | Admitting: Endocrinology

## 2019-10-09 ENCOUNTER — Other Ambulatory Visit: Payer: Self-pay

## 2019-10-09 ENCOUNTER — Ambulatory Visit (INDEPENDENT_AMBULATORY_CARE_PROVIDER_SITE_OTHER): Payer: Medicare Other | Admitting: Endocrinology

## 2019-10-09 VITALS — BP 140/84 | HR 87 | Ht 71.0 in | Wt 218.8 lb

## 2019-10-09 DIAGNOSIS — I2510 Atherosclerotic heart disease of native coronary artery without angina pectoris: Secondary | ICD-10-CM

## 2019-10-09 DIAGNOSIS — E1165 Type 2 diabetes mellitus with hyperglycemia: Secondary | ICD-10-CM

## 2019-10-09 DIAGNOSIS — IMO0002 Reserved for concepts with insufficient information to code with codable children: Secondary | ICD-10-CM

## 2019-10-09 DIAGNOSIS — E1140 Type 2 diabetes mellitus with diabetic neuropathy, unspecified: Secondary | ICD-10-CM | POA: Diagnosis not present

## 2019-10-09 LAB — POCT GLYCOSYLATED HEMOGLOBIN (HGB A1C): Hemoglobin A1C: 9 % — AB (ref 4.0–5.6)

## 2019-10-09 MED ORDER — HUMALOG MIX 50/50 KWIKPEN (50-50) 100 UNIT/ML ~~LOC~~ SUPN: 30.0000 [IU] | PEN_INJECTOR | Freq: Every day | SUBCUTANEOUS | 11 refills | Status: DC

## 2019-10-09 NOTE — Progress Notes (Signed)
Subjective:    Patient ID: [name], male    DOB: 1951/02/08, 68 y.o.   MRN: [id number]  HPI Pt returns for f/u of diabetes mellitus: DM type: Insulin-requiring type 2 Dx'ed: 4696 Complications: polyneuropathy, CAD, renal insufficiency, retinopathy, and TIA.   Therapy: insulin since 2011 DKA: never.   Severe hypoglycemia: last episode was early 2020.   Pancreatitis: never.  Other: He says he cannot use syringe and vial, due to tremor; he changed lantus to NPH, then 70/30, then 50/50, due to pattern on cbg's; he declines multiple daily injections; he gets insulin from [facility]--pt assistance.     Interval history: He says he never misses the insulin.  no cbg record, but states cbg's are all in the 200's.  pt states he feels well in general.  No recent steroids.   Past Medical History:  Diagnosis Date  . ALLERGIC RHINITIS   . CAD, NATIVE VESSEL    BMS to OM1 2001, DES to BMS 2005  . Chronic back pain    herniated disc  . Chronic combined systolic and diastolic heart failure ([place]) 06/29/2016  . Constipation    takes Carafate four times day  . DIAB W/UNSPEC COMP TYPE II/UNSPEC TYPE UNCNTRL   . ERECTILE DYSFUNCTION   . GERD   . HYPERLIPIDEMIA-MIXED   . HYPERTENSION, BENIGN   . LBBB (left bundle branch block) 02/02/2016  . MORTON'S NEUROMA, RIGHT   . Peripheral neuropathy   . Seasonal allergies    takes Allegra and Benadryl daily prn;uses Flonase daily  . SHOULDER PAIN, RIGHT   . 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 TIA on medication 02/2012    Past Surgical History:  Procedure Laterality Date  . ANGIOPLASTY    . COLONOSCOPY    . CORONARY ANGIOPLASTY  2005   2 stents  . DENTAL SURGERY    . LARYNGOPLASTY  08/07/2012   Procedure: LARYNGOPLASTY;  Surgeon: Izora Gala, MD;  Location: [facility];  Service: ENT;  Laterality: Left;  Left Vocal Cord Medialyzation  . left knee surgury     x 2   . stent  2001, 2004   coronary stents    Social  History   Socioeconomic History  . Marital status: Legally Separated    Spouse name: Not on file  . Number of children: 3  . Years of education: college  . Highest education level: Not on file  Occupational History  . Occupation: disabled    Employer: [employer]  Social Needs  . Financial resource strain: Not very hard  . Food insecurity    Worry: Never true    Inability: Never true  . Transportation needs    Medical: No    Non-medical: No  Tobacco Use  . Smoking status: Former Smoker    Quit date: 02/24/1983    Years since quitting: 36.6  . Smokeless tobacco: Never Used  Substance and Sexual Activity  . Alcohol use: No    Alcohol/week: 0.0 standard drinks  . Drug use: No  . Sexual activity: Never  Lifestyle  . Physical activity    Days per week: 2 days    Minutes per session: 50 min  . Stress: To some extent  Relationships  . Social connections    Talks on phone: More than three times a week    Gets together: More than three times a week    Attends religious service: More than 4 times per year  Active member of club or organization: Yes    Attends meetings of clubs or organizations: More than 4 times per year    Relationship status: Separated  . Intimate partner violence    Fear of current or ex partner: Not on file    Emotionally abused: Not on file    Physically abused: Not on file    Forced sexual activity: Not on file  Other Topics Concern  . Not on file  Social History Narrative       Current Outpatient Medications on File Prior to Visit  Medication Sig Dispense Refill  . atorvastatin (LIPITOR) 40 MG tablet TAKE 1 TABLET BY MOUTH ONCE DAILY .  MUST  HAVE  OFFICE  VISIT  BEFORE  FURTHER  REFILLS. 90 tablet 2  . Blood Glucose Monitoring Suppl (ONETOUCH VERIO) w/Device KIT USE METER TO CHECK GLUCOSE ONCE DAILY AS DIRECTED    . cetirizine (ZYRTEC) 10 MG tablet Take 10 mg by mouth as needed.    . chlorthalidone (HYGROTON) 25 MG tablet Take 1 tablet by mouth  once daily 90 tablet 1  . clopidogrel (PLAVIX) 75 MG tablet Take 1 tablet by mouth once daily 90 tablet 0  . FIBER COMPLETE PO Take 1 Scoop by mouth daily. Whole Food Fiber    . fluticasone (FLONASE) 50 MCG/ACT nasal spray Place 2 sprays into both nostrils daily. 16 g 0  . glucose blood (ONETOUCH VERIO) test strip 1 each by Other route 2 (two) times daily. Use to monitor glucose levels BID; E11.9 100 each 12  . Homeopathic Products (BODYANEW CLEANSE MULTIPACK) LIQD Take 1 each by mouth daily.    Marland Kitchen losartan (COZAAR) 100 MG tablet Take 1 tablet by mouth once daily 90 tablet 2  . NON FORMULARY vasinox cardiovascular suppor    . NON FORMULARY Take 17 tablets by mouth daily. Glucobalance    . NON FORMULARY Take 4 Scoops by mouth daily. SP Complete Dairy Free Dietary Supplement    . OneTouch Delica Lancets 16X MISC 1 each by Does not apply route 2 (two) times daily. Use to monitor glucose levels BID; E11.9 100 each 3  . OVER THE COUNTER MEDICATION Take 6 tablets by mouth daily. NeuroRenew    . RELION PEN NEEDLES 32G X 4 MM MISC USE 1 ONCE DAILY AS DIRECTED  11  . senna-docusate (SENOKOT-S) 8.6-50 MG per tablet Take 2 tablets by mouth 2 (two) times daily. For constipation.    . carvedilol (COREG) 12.5 MG tablet Take 1.5 tablets (18.75 mg total) by mouth 2 (two) times daily. 270 tablet 1   No current facility-administered medications on file prior to visit.     Allergies  Allergen Reactions  . Penicillins Anaphylaxis  . Pneumococcal Vaccines Anaphylaxis  . Shellfish Allergy Anaphylaxis  . Influenza Vaccines   . Lisinopril     cough  . Topiramate Other (See Comments)    Chest spasms and numbness  . Amlodipine Other (See Comments)    LE edema    Family History  Problem Relation Age of Onset  . Cancer Mother     BP 140/84 (BP Location: Right Arm, Patient Position: Sitting, Cuff Size: Large)   Pulse 87   Ht _0  (1.803 m)   Wt 218 lb 12.8 oz (99.2 kg)   SpO2 93%   BMI 30.52 kg/m     Review of Systems He denies hypoglycemia.      Objective:   Physical Exam VITAL SIGNS:  See vs page  GENERAL: no distress Pulses: dorsalis pedis intact bilat.   MSK: no deformity of the feet CV: no leg edema Skin:  no ulcer on the feet.  normal color and temp on the feet. Neuro: sensation is intact to touch on the feet  Lab Results  Component Value Date   CREATININE 1.74 (H) 08/28/2018   BUN 26 (H) 08/28/2018   NA 139 08/28/2018   K 3.6 08/28/2018   CL 103 08/28/2018   CO2 30 08/28/2018    Lab Results  Component Value Date   HGBA1C 9.0 (A) 10/09/2019       Assessment & Plan:  Insulin-requiring type 2 DM, with DR: worse Renal failure: in this setting, he needs a fast-acting qam insulin mixture.  Patient Instructions  Your blood pressure is high today.  Please see your primary care provider soon, to have it rechecked Please increase the insulin to 30 units with breakfast.   On this type of insulin schedule, you should eat meals on a regular schedule (especially breakfast and lunch).  If a meal is missed or significantly delayed, your blood sugar could go low.   Please come back for a follow-up appointment in 2 months.   check your blood sugar twice a day.  vary the time of day when you check, between before the 3 meals, and at bedtime.  also check if you have symptoms of your blood sugar being too high or too low.  please keep a record of the readings and bring it to your next appointment here (or you can bring the meter itself).  You can write it on any piece of paper.  please call us sooner if your blood sugar goes below 70, or if you have a lot of readings over 200.

## 2019-10-09 NOTE — Patient Instructions (Addendum)
Your blood pressure is high today.  Please see your primary care provider soon, to have it rechecked Please increase the insulin to 30 units with breakfast.   On this type of insulin schedule, you should eat meals on a regular schedule (especially breakfast and lunch).  If a meal is missed or significantly delayed, your blood sugar could go low.   Please come back for a follow-up appointment in 2 months.   check your blood sugar twice a day.  vary the time of day when you check, between before the 3 meals, and at bedtime.  also check if you have symptoms of your blood sugar being too high or too low.  please keep a record of the readings and bring it to your next appointment here (or you can bring the meter itself).  You can write it on any piece of paper.  please call us sooner if your blood sugar goes below 70, or if you have a lot of readings over 200.

## 2019-10-15 ENCOUNTER — Ambulatory Visit: Payer: Medicare Other | Admitting: Internal Medicine

## 2019-10-16 NOTE — Progress Notes (Signed)
Subjective:    Patient ID: [name], male    DOB: 01-10-1951, 68 y.o.   MRN: [id number]  HPI The patient is here for follow up.  He is not exercising regularly.    He fell one month ago.  Since then he has had intermittent chest pain - it feels like a nerve pinching sensation that is intermittent. It has improved - he has not had it this week.    CAD, combined chronic systolic and diastolic HF, Hypertension: He is taking his medication daily. He is compliant with a low sodium diet.  He denies palpitations, edema, shortness of breath and regular headaches.    Hyperlipidemia: He is taking his medication daily. He is compliant with a low fat/cholesterol diet. He denies myalgias.   Hemiparesis affecting left side due to CVA:  He still has the left sided weakness.  He is not exercising regularly.  He is taking all his medications daily.    Diabetes: he follows with Dr Loanne Drilling.  He is taking his medication daily as prescribed. He has not beencompliant with a diabetic diet. He checks his feet daily and denies foot lesions. He has an eye exam scheduled.   CKD, stage 3:  He does not take any nsaids.  He drinks water throughout the day.     Medications and allergies reviewed with patient and updated if appropriate.  Patient Active Problem List   Diagnosis Date Noted  . Sciatica of left side 07/11/2019  . Cough 07/11/2019  . Edema leg 10/26/2018  . History of stroke 10/26/2018  . DOE (dyspnea on exertion) 10/26/2018  . Left hip pain 07/14/2018  . Acute left ankle pain 07/14/2018  . Chest pain 07/14/2018  . Acute sinus infection 03/13/2018  . Nonintractable headache 02/24/2018  . CRI (chronic renal insufficiency), stage 3 (moderate) 02/22/2017  . Anemia 02/16/2017  . Chronic combined systolic and diastolic heart failure ([place]) 06/29/2016  . LBBB (left bundle branch block) 02/02/2016  . Nonallopathic lesion of lumbosacral region 12/11/2014  . Nonallopathic lesion of sacral  region 12/11/2014  . Nonallopathic lesion of thoracic region 12/11/2014  . Arthritis of left hip 11/20/2014  . Ischial bursitis of left side 10/28/2014  . Hamstring tightness of left lower extremity 09/16/2014  . Piriformis syndrome of left side 09/16/2014  . Dysphonia 06/22/2013  . Hemiparesis affecting left side as late effect of stroke ([place]) 10/10/2012  . Dysphagia following cerebrovascular accident 08/14/2012  . Chronic back pain   . Peripheral neuropathy ([place])   . TIA (transient ischemic attack) 04/08/2012  . Stroke ([place]) 02/21/2012  . [place], RIGHT 05/29/2010  . Diabetes ([place]) 04/13/2010  . ALLERGIC RHINITIS 04/13/2010  . GERD 04/13/2010  . ERECTILE DYSFUNCTION 03/12/2009  . Essential hypertension 03/12/2009  . CAD S/P percutaneous coronary angioplasty 03/12/2009  . Hyperlipidemia 03/07/2009    Current Outpatient Medications on File Prior to Visit  Medication Sig Dispense Refill  . atorvastatin (LIPITOR) 40 MG tablet TAKE 1 TABLET BY MOUTH ONCE DAILY .  MUST  HAVE  OFFICE  VISIT  BEFORE  FURTHER  REFILLS. 90 tablet 2  . Blood Glucose Monitoring Suppl (ONETOUCH VERIO) w/Device KIT USE METER TO CHECK GLUCOSE ONCE DAILY AS DIRECTED    . cetirizine (ZYRTEC) 10 MG tablet Take 10 mg by mouth as needed.    . chlorthalidone (HYGROTON) 25 MG tablet Take 1 tablet by mouth once daily 90 tablet 1  . clopidogrel (PLAVIX) 75 MG tablet Take 1 tablet by mouth  once daily 90 tablet 0  . FIBER COMPLETE PO Take 1 Scoop by mouth daily. Whole Food Fiber    . fluticasone (FLONASE) 50 MCG/ACT nasal spray Place 2 sprays into both nostrils daily. 16 g 0  . glucose blood (ONETOUCH VERIO) test strip 1 each by Other route 2 (two) times daily. Use to monitor glucose levels BID; E11.9 100 each 12  . Homeopathic Products (BODYANEW CLEANSE MULTIPACK) LIQD Take 1 each by mouth daily.    . Insulin Lispro Prot & Lispro (HUMALOG MIX 50/50 KWIKPEN) (50-50) 100 UNIT/ML Kwikpen Inject 30 Units into the  skin daily with breakfast. 15 mL 11  . losartan (COZAAR) 100 MG tablet Take 1 tablet by mouth once daily 90 tablet 2  . NON FORMULARY vasinox cardiovascular suppor    . NON FORMULARY Take 17 tablets by mouth daily. Glucobalance    . NON FORMULARY Take 4 Scoops by mouth daily. SP Complete Dairy Free Dietary Supplement    . OneTouch Delica Lancets 41C MISC 1 each by Does not apply route 2 (two) times daily. Use to monitor glucose levels BID; E11.9 100 each 3  . OVER THE COUNTER MEDICATION Take 6 tablets by mouth daily. NeuroRenew    . RELION PEN NEEDLES 32G X 4 MM MISC USE 1 ONCE DAILY AS DIRECTED  11  . senna-docusate (SENOKOT-S) 8.6-50 MG per tablet Take 2 tablets by mouth 2 (two) times daily. For constipation.    . carvedilol (COREG) 12.5 MG tablet Take 1.5 tablets (18.75 mg total) by mouth 2 (two) times daily. 270 tablet 1   No current facility-administered medications on file prior to visit.     Past Medical History:  Diagnosis Date  . ALLERGIC RHINITIS   . CAD, NATIVE VESSEL    BMS to OM1 2001, DES to BMS 2005  . Chronic back pain    herniated disc  . Chronic combined systolic and diastolic heart failure ([place]) 06/29/2016  . Constipation    takes Carafate four times day  . DIAB W/UNSPEC COMP TYPE II/UNSPEC TYPE UNCNTRL   . ERECTILE DYSFUNCTION   . GERD   . HYPERLIPIDEMIA-MIXED   . HYPERTENSION, BENIGN   . LBBB (left bundle branch block) 02/02/2016  . MORTON'S NEUROMA, RIGHT   . Peripheral neuropathy   . Seasonal allergies    takes Allegra and Benadryl daily prn;uses Flonase daily  . SHOULDER PAIN, RIGHT   . 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 TIA on medication 02/2012    Past Surgical History:  Procedure Laterality Date  . ANGIOPLASTY    . COLONOSCOPY    . CORONARY ANGIOPLASTY  2005   2 stents  . DENTAL SURGERY    . LARYNGOPLASTY  08/07/2012   Procedure: LARYNGOPLASTY;  Surgeon: Izora Gala, MD;  Location: [facility];  Service: ENT;  Laterality:  Left;  Left Vocal Cord Medialyzation  . left knee surgury     x 2   . stent  2001, 2004   coronary stents    Social History   Socioeconomic History  . Marital status: Legally Separated    Spouse name: Not on file  . Number of children: 3  . Years of education: college  . Highest education level: Not on file  Occupational History  . Occupation: disabled    Employer: [employer]  Social Needs  . Financial resource strain: Not very hard  . Food insecurity    Worry: Never true  Inability: Never true  . Transportation needs    Medical: No    Non-medical: No  Tobacco Use  . Smoking status: Former Smoker    Quit date: 02/24/1983    Years since quitting: 36.6  . Smokeless tobacco: Never Used  Substance and Sexual Activity  . Alcohol use: No    Alcohol/week: 0.0 standard drinks  . Drug use: No  . Sexual activity: Never  Lifestyle  . Physical activity    Days per week: 2 days    Minutes per session: 50 min  . Stress: To some extent  Relationships  . Social connections    Talks on phone: More than three times a week    Gets together: More than three times a week    Attends religious service: More than 4 times per year    Active member of club or organization: Yes    Attends meetings of clubs or organizations: More than 4 times per year    Relationship status: Separated  Other Topics Concern  . Not on file  Social History Narrative       Family History  Problem Relation Age of Onset  . Cancer Mother     Review of Systems  Constitutional: Negative for chills and fever.  Respiratory: Negative for cough, shortness of breath and wheezing.   Cardiovascular: Positive for chest pain (MSK since fall - improved, no other CP) and leg swelling (mild). Negative for palpitations.  Neurological: Positive for light-headedness. Negative for headaches.       Objective:   Vitals:   10/17/19 1258  BP: 126/68  Pulse: 72  Resp: 16  Temp: 97.8 F (36.6 C)  SpO2: 98%   BP  Readings from Last 3 Encounters:  10/17/19 126/68  10/09/19 140/84  07/09/19 130/72   Wt Readings from Last 3 Encounters:  10/17/19 220 lb 12.8 oz (100.2 kg)  10/09/19 218 lb 12.8 oz (99.2 kg)  07/09/19 214 lb 3.2 oz (97.2 kg)   Body mass index is 30.8 kg/m.   Physical Exam    Constitutional: Appears well-developed and well-nourished. No distress.  HENT:  Head: Normocephalic and atraumatic.  Neck: Neck supple. No tracheal deviation present. No thyromegaly present.  No cervical lymphadenopathy Cardiovascular: Normal rate, regular rhythm and normal heart sounds.   No murmur heard. No carotid bruit .  No edema Pulmonary/Chest: Effort normal and breath sounds normal. No respiratory distress. No has no wheezes. No rales.  Skin: Skin is warm and dry. Not diaphoretic.  Psychiatric: Normal mood and affect. Behavior is normal.      Assessment & Plan:    See Problem List for Assessment and Plan of chronic medical problems.

## 2019-10-16 NOTE — Patient Instructions (Addendum)
  Tests ordered today. Your results will be released to MyChart (or called to you) after review.  If any changes need to be made, you will be notified at that same time.    Medications reviewed and updated.  Changes include :   none     Please followup in 6 months   

## 2019-10-17 ENCOUNTER — Encounter: Payer: Self-pay | Admitting: Internal Medicine

## 2019-10-17 ENCOUNTER — Ambulatory Visit (INDEPENDENT_AMBULATORY_CARE_PROVIDER_SITE_OTHER): Payer: Medicare Other | Admitting: Internal Medicine

## 2019-10-17 ENCOUNTER — Other Ambulatory Visit: Payer: Self-pay

## 2019-10-17 ENCOUNTER — Other Ambulatory Visit (INDEPENDENT_AMBULATORY_CARE_PROVIDER_SITE_OTHER): Payer: Medicare Other

## 2019-10-17 VITALS — BP 126/68 | HR 72 | Temp 97.8°F | Resp 16 | Ht 71.0 in | Wt 220.8 lb

## 2019-10-17 DIAGNOSIS — N183 Chronic kidney disease, stage 3 unspecified: Secondary | ICD-10-CM | POA: Diagnosis not present

## 2019-10-17 DIAGNOSIS — I10 Essential (primary) hypertension: Secondary | ICD-10-CM

## 2019-10-17 DIAGNOSIS — E119 Type 2 diabetes mellitus without complications: Secondary | ICD-10-CM

## 2019-10-17 DIAGNOSIS — Z794 Long term (current) use of insulin: Secondary | ICD-10-CM

## 2019-10-17 DIAGNOSIS — E7849 Other hyperlipidemia: Secondary | ICD-10-CM

## 2019-10-17 DIAGNOSIS — I5042 Chronic combined systolic (congestive) and diastolic (congestive) heart failure: Secondary | ICD-10-CM | POA: Diagnosis not present

## 2019-10-17 DIAGNOSIS — I69354 Hemiplegia and hemiparesis following cerebral infarction affecting left non-dominant side: Secondary | ICD-10-CM

## 2019-10-17 DIAGNOSIS — I2510 Atherosclerotic heart disease of native coronary artery without angina pectoris: Secondary | ICD-10-CM | POA: Diagnosis not present

## 2019-10-17 LAB — CBC WITH DIFFERENTIAL/PLATELET
Basophils Absolute: 0.1 10*3/uL (ref 0.0–0.1)
Basophils Relative: 1.4 % (ref 0.0–3.0)
Eosinophils Absolute: 0.2 10*3/uL (ref 0.0–0.7)
Eosinophils Relative: 2.5 % (ref 0.0–5.0)
HCT: 36.6 % — ABNORMAL LOW (ref 39.0–52.0)
Hemoglobin: 12.2 g/dL — ABNORMAL LOW (ref 13.0–17.0)
Lymphocytes Relative: 32.5 % (ref 12.0–46.0)
Lymphs Abs: 2.2 10*3/uL (ref 0.7–4.0)
MCHC: 33.3 g/dL (ref 30.0–36.0)
MCV: 93.8 fl (ref 78.0–100.0)
Monocytes Absolute: 0.6 10*3/uL (ref 0.1–1.0)
Monocytes Relative: 8.9 % (ref 3.0–12.0)
Neutro Abs: 3.7 10*3/uL (ref 1.4–7.7)
Neutrophils Relative %: 54.7 % (ref 43.0–77.0)
Platelets: 186 10*3/uL (ref 150.0–400.0)
RBC: 3.9 Mil/uL — ABNORMAL LOW (ref 4.22–5.81)
RDW: 13.1 % (ref 11.5–15.5)
WBC: 6.8 10*3/uL (ref 4.0–10.5)

## 2019-10-17 LAB — LIPID PANEL
Cholesterol: 118 mg/dL (ref 0–200)
HDL: 44.5 mg/dL (ref >39.00)
LDL Cholesterol: 43 mg/dL (ref 0–99)
NonHDL: 73.89
Total CHOL/HDL Ratio: 3
Triglycerides: 155 mg/dL — ABNORMAL HIGH (ref 0.0–149.0)
VLDL: 31 mg/dL (ref 0.0–40.0)

## 2019-10-17 LAB — COMPREHENSIVE METABOLIC PANEL
ALT: 20 U/L (ref 0–53)
AST: 19 U/L (ref 0–37)
Albumin: 3.9 g/dL (ref 3.5–5.2)
Alkaline Phosphatase: 78 U/L (ref 39–117)
BUN: 30 mg/dL — ABNORMAL HIGH (ref 6–23)
CO2: 30 mEq/L (ref 19–32)
Calcium: 9.4 mg/dL (ref 8.4–10.5)
Chloride: 100 mEq/L (ref 96–112)
Creatinine, Ser: 1.69 mg/dL — ABNORMAL HIGH (ref 0.40–1.50)
GFR: 48.79 mL/min — ABNORMAL LOW (ref >60.00)
Glucose, Bld: 336 mg/dL — ABNORMAL HIGH (ref 70–99)
Potassium: 3.8 mEq/L (ref 3.5–5.1)
Sodium: 137 mEq/L (ref 135–145)
Total Bilirubin: 1 mg/dL (ref 0.2–1.2)
Total Protein: 8 g/dL (ref 6.0–8.3)

## 2019-10-17 LAB — TSH: TSH: 2.08 u[IU]/mL (ref 0.35–4.50)

## 2019-10-17 NOTE — Assessment & Plan Note (Signed)
cmp

## 2019-10-17 NOTE — Assessment & Plan Note (Signed)
BP controlled Working on getting sugars controlled Stressed regular exercise Continue statin, plavix, BP medications

## 2019-10-17 NOTE — Assessment & Plan Note (Signed)
Management per Dr Loanne Drilling Has not been compliant with a diabetic diet but has started to be more compliant Encouraged regular exercise

## 2019-10-17 NOTE — Assessment & Plan Note (Signed)
euvolemic Continue current medications

## 2019-10-17 NOTE — Assessment & Plan Note (Signed)
BP well controlled Current regimen effective and well tolerated Continue current medications at current doses cmp  

## 2019-10-17 NOTE — Assessment & Plan Note (Signed)
Check lipid panel,cmp ,tsh Continue daily statin Regular exercise and healthy diet encouraged  

## 2019-10-19 ENCOUNTER — Encounter: Payer: Self-pay | Admitting: Internal Medicine

## 2019-11-08 ENCOUNTER — Other Ambulatory Visit: Payer: Self-pay | Admitting: Cardiology

## 2019-11-12 ENCOUNTER — Encounter (INDEPENDENT_AMBULATORY_CARE_PROVIDER_SITE_OTHER): Payer: Medicare Other | Admitting: Ophthalmology

## 2019-11-12 DIAGNOSIS — H43813 Vitreous degeneration, bilateral: Secondary | ICD-10-CM

## 2019-11-12 DIAGNOSIS — H2513 Age-related nuclear cataract, bilateral: Secondary | ICD-10-CM

## 2019-11-12 DIAGNOSIS — I10 Essential (primary) hypertension: Secondary | ICD-10-CM

## 2019-11-12 DIAGNOSIS — H353122 Nonexudative age-related macular degeneration, left eye, intermediate dry stage: Secondary | ICD-10-CM

## 2019-11-12 DIAGNOSIS — E11319 Type 2 diabetes mellitus with unspecified diabetic retinopathy without macular edema: Secondary | ICD-10-CM

## 2019-11-12 DIAGNOSIS — E113293 Type 2 diabetes mellitus with mild nonproliferative diabetic retinopathy without macular edema, bilateral: Secondary | ICD-10-CM | POA: Diagnosis not present

## 2019-11-12 DIAGNOSIS — H35373 Puckering of macula, bilateral: Secondary | ICD-10-CM

## 2019-11-22 ENCOUNTER — Telehealth: Payer: Self-pay | Admitting: Internal Medicine

## 2019-11-22 NOTE — Telephone Encounter (Signed)
Recv'd records from [facility] forwarded 4 pages to Dr. Billey Gosling 12/31/20fbg

## 2019-11-30 ENCOUNTER — Ambulatory Visit (INDEPENDENT_AMBULATORY_CARE_PROVIDER_SITE_OTHER): Payer: Medicare HMO | Admitting: Podiatry

## 2019-11-30 ENCOUNTER — Other Ambulatory Visit: Payer: Self-pay

## 2019-11-30 ENCOUNTER — Encounter: Payer: Self-pay | Admitting: Podiatry

## 2019-11-30 DIAGNOSIS — M79674 Pain in right toe(s): Secondary | ICD-10-CM | POA: Diagnosis not present

## 2019-11-30 DIAGNOSIS — B351 Tinea unguium: Secondary | ICD-10-CM

## 2019-11-30 DIAGNOSIS — E1142 Type 2 diabetes mellitus with diabetic polyneuropathy: Secondary | ICD-10-CM | POA: Diagnosis not present

## 2019-11-30 DIAGNOSIS — M79675 Pain in left toe(s): Secondary | ICD-10-CM

## 2019-11-30 LAB — HM DIABETES FOOT EXAM

## 2019-11-30 NOTE — Progress Notes (Signed)
Subjective: [name] presents today for preventative diabetic foot. Patient is seen for follow up of painful, mycotic toenails which interfere with comfortable ambulation when wearing enclosed shoe gear. Pain is relieved with periodic professional debridement.  He relates his grandson dropped a race car on his left great toe on Christmas Day. He is concerned it is not healing. Denies any redness, swelling, drainage, fever, chills, nightsweats, nausea or vomiting.   Binnie Rail, MD is patient's PCP. Last visit was: 10/17/2019.  Medications reviewed in chart.  Allergies  Allergen Reactions  . Penicillins Anaphylaxis  . Pneumococcal Vaccines Anaphylaxis  . Shellfish Allergy Anaphylaxis  . Influenza Vaccines   . Lisinopril     cough  . Topiramate Other (See Comments)    Chest spasms and numbness  . Amlodipine Other (See Comments)    LE edema    Objective: There were no vitals filed for this visit.  Vascular Examination: Capillary refill time to digits <4 seconds b/l.   Dorsalis pedis present b/l.  Posterior tibial pulses present b/l.  Digital hair absent.  Skin temperature gradient WNL b/l.   Dermatological Examination: Skin with normal turgor, texture and tone b/l.  Toenails 1-5 b/l discolored, thick, dystrophic with subungual debris and pain with palpation to nailbeds due to thickness of nails.  He has a small scab on dorsum of left hallux distal to IPJ. Stable and adhered. No erythema, no edema, no drainage, no flocculence, no warmth.   Musculoskeletal: Muscle strength 5/5 b/l to all LE muscle groups.  Gross bony deformities:  Hammertoes 2nd digit b/l.  No pain, crepitus or joint limitation with passive/active ROM b/l.  Neurological Examination: Protective sensation intact 5/5 b/l with 10 gram monofilament.  Vibratory sensation intact right; absent LLE.  Assessment: 1. Painful onychomycosis toenails 1-5 b/l 2. NIDDM with  neuropathy  Plan: 1. Continue diabetic foot care principles. Literature dispensed on today. Patient to continue soft, supportive shoe gear daily.  2. Start procedure for diabetic shoes. Patient qualifies based on diagnoses: NIDDM and hammertoes. 3. Toenails 1-5 b/l were debrided in length and girth without iatrogenic bleeding. 4. Patient to continue soft, supportive shoe gear. 5. Patient to report any pedal injuries to medical professional. 6. Follow up 3 months.  7. Patient/POA to call should there be a concern in the interim.

## 2019-11-30 NOTE — Patient Instructions (Signed)
Diabetes Mellitus and Foot Care Foot care is an important part of your health, especially when you have diabetes. Diabetes may cause you to have problems because of poor blood flow (circulation) to your feet and legs, which can cause your skin to:  Become thinner and drier.  Break more easily.  Heal more slowly.  Peel and crack. You may also have nerve damage (neuropathy) in your legs and feet, causing decreased feeling in them. This means that you may not notice minor injuries to your feet that could lead to more serious problems. Noticing and addressing any potential problems early is the best way to prevent future foot problems. How to care for your feet Foot hygiene  Wash your feet daily with warm water and mild soap. Do not use hot water. Then, pat your feet and the areas between your toes until they are completely dry. Do not soak your feet as this can dry your skin.  Trim your toenails straight across. Do not dig under them or around the cuticle. File the edges of your nails with an emery board or nail file.  Apply a moisturizing lotion or petroleum jelly to the skin on your feet and to dry, brittle toenails. Use lotion that does not contain alcohol and is unscented. Do not apply lotion between your toes. Shoes and socks  Wear clean socks or stockings every day. Make sure they are not too tight. Do not wear knee-high stockings since they may decrease blood flow to your legs.  Wear shoes that fit properly and have enough cushioning. Always look in your shoes before you put them on to be sure there are no objects inside.  To break in new shoes, wear them for just a few hours a day. This prevents injuries on your feet. Wounds, scrapes, corns, and calluses  Check your feet daily for blisters, cuts, bruises, sores, and redness. If you cannot see the bottom of your feet, use a mirror or ask someone for help.  Do not cut corns or calluses or try to remove them with medicine.  If you  find a minor scrape, cut, or break in the skin on your feet, keep it and the skin around it clean and dry. You may clean these areas with mild soap and water. Do not clean the area with peroxide, alcohol, or iodine.  If you have a wound, scrape, corn, or callus on your foot, look at it several times a day to make sure it is healing and not infected. Check for: ? Redness, swelling, or pain. ? Fluid or blood. ? Warmth. ? Pus or a bad smell. General instructions  Do not cross your legs. This may decrease blood flow to your feet.  Do not use heating pads or hot water bottles on your feet. They may burn your skin. If you have lost feeling in your feet or legs, you may not know this is happening until it is too late.  Protect your feet from hot and cold by wearing shoes, such as at the beach or on hot pavement.  Schedule a complete foot exam at least once a year (annually) or more often if you have foot problems. If you have foot problems, report any cuts, sores, or bruises to your health care provider immediately. Contact a health care provider if:  You have a medical condition that increases your risk of infection and you have any cuts, sores, or bruises on your feet.  You have an injury that is not   healing.  You have redness on your legs or feet.  You feel burning or tingling in your legs or feet.  You have pain or cramps in your legs and feet.  Your legs or feet are numb.  Your feet always feel cold.  You have pain around a toenail. Get help right away if:  You have a wound, scrape, corn, or callus on your foot and: ? You have pain, swelling, or redness that gets worse. ? You have fluid or blood coming from the wound, scrape, corn, or callus. ? Your wound, scrape, corn, or callus feels warm to the touch. ? You have pus or a bad smell coming from the wound, scrape, corn, or callus. ? You have a fever. ? You have a red line going up your leg. Summary  Check your feet every day  for cuts, sores, red spots, swelling, and blisters.  Moisturize feet and legs daily.  Wear shoes that fit properly and have enough cushioning.  If you have foot problems, report any cuts, sores, or bruises to your health care provider immediately.  Schedule a complete foot exam at least once a year (annually) or more often if you have foot problems. This information is not intended to replace advice given to you by your health care provider. Make sure you discuss any questions you have with your health care provider. Document Revised: 08/01/2019 Document Reviewed: 12/10/2016 Elsevier Patient Education  2020 Elsevier Inc.  

## 2019-12-03 ENCOUNTER — Encounter (INDEPENDENT_AMBULATORY_CARE_PROVIDER_SITE_OTHER): Payer: Medicare HMO | Admitting: Ophthalmology

## 2019-12-03 ENCOUNTER — Other Ambulatory Visit: Payer: Self-pay

## 2019-12-03 DIAGNOSIS — E113293 Type 2 diabetes mellitus with mild nonproliferative diabetic retinopathy without macular edema, bilateral: Secondary | ICD-10-CM | POA: Diagnosis not present

## 2019-12-03 DIAGNOSIS — E11319 Type 2 diabetes mellitus with unspecified diabetic retinopathy without macular edema: Secondary | ICD-10-CM

## 2019-12-06 ENCOUNTER — Telehealth: Payer: Self-pay

## 2019-12-06 DIAGNOSIS — I69354 Hemiplegia and hemiparesis following cerebral infarction affecting left non-dominant side: Secondary | ICD-10-CM

## 2019-12-06 NOTE — Telephone Encounter (Signed)
Copied from [facility] [place] [id number]. Topic: General - Other >> Dec 06, 2019 10:59 AM Sheran Luz wrote: Patient requesting call back from referrals dept to discuss diabetic shoes referral.

## 2019-12-06 NOTE — Telephone Encounter (Signed)
We don't do diabetic shoe referrals. This  needs to go the the [facility] or doctor

## 2019-12-07 ENCOUNTER — Other Ambulatory Visit: Payer: Self-pay

## 2019-12-07 NOTE — Telephone Encounter (Signed)
I advised pt to follow up with Dr. Loanne Drilling in regards since he manages his diabetes. Pt would like an order put in for PT for the left side of his body weakness from past stroke.

## 2019-12-08 NOTE — Telephone Encounter (Signed)
PT referral ordered

## 2019-12-10 ENCOUNTER — Other Ambulatory Visit: Payer: Self-pay

## 2019-12-10 DIAGNOSIS — IMO0002 Reserved for concepts with insufficient information to code with codable children: Secondary | ICD-10-CM

## 2019-12-10 DIAGNOSIS — E1140 Type 2 diabetes mellitus with diabetic neuropathy, unspecified: Secondary | ICD-10-CM

## 2019-12-10 MED ORDER — ONETOUCH VERIO VI STRP: 1.0000 | ORAL_STRIP | Freq: Two times a day (BID) | 12 refills | Status: DC

## 2019-12-11 ENCOUNTER — Encounter: Payer: Self-pay | Admitting: Endocrinology

## 2019-12-11 ENCOUNTER — Ambulatory Visit (INDEPENDENT_AMBULATORY_CARE_PROVIDER_SITE_OTHER): Payer: Medicare HMO | Admitting: Endocrinology

## 2019-12-11 ENCOUNTER — Other Ambulatory Visit: Payer: Self-pay

## 2019-12-11 DIAGNOSIS — E1140 Type 2 diabetes mellitus with diabetic neuropathy, unspecified: Secondary | ICD-10-CM | POA: Diagnosis not present

## 2019-12-11 DIAGNOSIS — Z794 Long term (current) use of insulin: Secondary | ICD-10-CM | POA: Diagnosis not present

## 2019-12-11 DIAGNOSIS — E119 Type 2 diabetes mellitus without complications: Secondary | ICD-10-CM

## 2019-12-11 DIAGNOSIS — IMO0002 Reserved for concepts with insufficient information to code with codable children: Secondary | ICD-10-CM

## 2019-12-11 DIAGNOSIS — E1165 Type 2 diabetes mellitus with hyperglycemia: Secondary | ICD-10-CM | POA: Diagnosis not present

## 2019-12-11 MED ORDER — HUMALOG MIX 50/50 KWIKPEN (50-50) 100 UNIT/ML ~~LOC~~ SUPN: 40.0000 [IU] | PEN_INJECTOR | Freq: Every day | SUBCUTANEOUS | 11 refills | Status: DC

## 2019-12-11 MED ORDER — ONETOUCH VERIO VI STRP: 1.0000 | ORAL_STRIP | Freq: Two times a day (BID) | 3 refills | Status: DC

## 2019-12-11 NOTE — Patient Instructions (Addendum)
Please increase the insulin to 40 units with breakfast.   On this type of insulin schedule, you should eat meals on a regular schedule (especially breakfast and lunch).  If a meal is missed or significantly delayed, your blood sugar could go low.   Please come back for a follow-up appointment in 2 months.   check your blood sugar twice a day.  vary the time of day when you check, between before the 3 meals, and at bedtime.  also check if you have symptoms of your blood sugar being too high or too low.  please keep a record of the readings and bring it to your next appointment here (or you can bring the meter itself).  You can write it on any piece of paper.  please call us sooner if your blood sugar goes below 70, or if you have a lot of readings over 200.

## 2019-12-11 NOTE — Progress Notes (Signed)
Subjective:    Patient ID: [name], male    DOB: [birth year], 69 y.o.   MRN: [id number]  HPI telehealth visit today via phone x 7 minutes Alternatives to telehealth are presented to this patient, and the patient agrees to the telehealth visit.   Pt is advised of the cost of the visit, and agrees to this, also.   Patient is at home, and I am at the office.   Persons attending the telehealth visit: the patient and I.   Pt returns for f/u of diabetes mellitus: DM type: Insulin-requiring type 2 Dx'ed: 0786 Complications: PN, CAD, renal insuff, DR, and TIA.   Therapy: insulin since 2011 DKA: never.   Severe hypoglycemia: last episode was early 2020.   Pancreatitis: never.  Other: He says he cannot use syringe and vial, due to tremor; he changed lantus to NPH, then 70/30, then 50/50, due to pattern on cbg's; he declines multiple daily injections; he gets insulin from [facility]--pt assistance.     Interval history: He says he never misses the insulin.  no cbg record, but states cbg's vary from 170-221.  pt states he feels well in general.  No recent steroids.  He takes 35 units qam.   Past Medical History:  Diagnosis Date  . ALLERGIC RHINITIS   . CAD, NATIVE VESSEL    BMS to OM1 2001, DES to BMS 2005  . Chronic back pain    herniated disc  . Chronic combined systolic and diastolic heart failure ([place]) 06/29/2016  . Constipation    takes Carafate four times day  . DIAB W/UNSPEC COMP TYPE II/UNSPEC TYPE UNCNTRL   . ERECTILE DYSFUNCTION   . GERD   . HYPERLIPIDEMIA-MIXED   . HYPERTENSION, BENIGN   . LBBB (left bundle branch block) 02/02/2016  . MORTON'S NEUROMA, RIGHT   . Peripheral neuropathy   . Seasonal allergies    takes Allegra and Benadryl daily prn;uses Flonase daily  . SHOULDER PAIN, RIGHT   . 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 TIA on medication 02/2012    Past Surgical History:  Procedure Laterality Date  . ANGIOPLASTY    . COLONOSCOPY     . CORONARY ANGIOPLASTY  2005   2 stents  . DENTAL SURGERY    . LARYNGOPLASTY  08/07/2012   Procedure: LARYNGOPLASTY;  Surgeon: Izora Gala, MD;  Location: [facility];  Service: ENT;  Laterality: Left;  Left Vocal Cord Medialyzation  . left knee surgury     x 2   . stent  2001, 2004   coronary stents    Social History   Socioeconomic History  . Marital status: Legally Separated    Spouse name: Not on file  . Number of children: 3  . Years of education: college  . Highest education level: Not on file  Occupational History  . Occupation: disabled    Employer: [employer]  Tobacco Use  . Smoking status: Former Smoker    Quit date: 02/24/1983    Years since quitting: 36.8  . Smokeless tobacco: Never Used  Substance and Sexual Activity  . Alcohol use: No    Alcohol/week: 0.0 standard drinks  . Drug use: No  . Sexual activity: Never  Other Topics Concern  . Not on file  Social History Narrative      Social Determinants of Health   Financial Resource Strain:   . Difficulty of Paying Living Expenses: Not on file  Food Insecurity:   .  Worried About Charity fundraiser in the Last Year: Not on file  . Ran Out of Food in the Last Year: Not on file  Transportation Needs:   . Lack of Transportation (Medical): Not on file  . Lack of Transportation (Non-Medical): Not on file  Physical Activity:   . Days of Exercise per Week: Not on file  . Minutes of Exercise per Session: Not on file  Stress:   . Feeling of Stress : Not on file  Social Connections:   . Frequency of Communication with Friends and Family: Not on file  . Frequency of Social Gatherings with Friends and Family: Not on file  . Attends Religious Services: Not on file  . Active Member of Clubs or Organizations: Not on file  . Attends Archivist Meetings: Not on file  . Marital Status: Not on file  Intimate Partner Violence:   . Fear of Current or Ex-Partner: Not on file  . Emotionally Abused: Not on file  .  Physically Abused: Not on file  . Sexually Abused: Not on file    Current Outpatient Medications on File Prior to Visit  Medication Sig Dispense Refill  . atorvastatin (LIPITOR) 40 MG tablet TAKE 1 TABLET BY MOUTH ONCE DAILY .  MUST  HAVE  OFFICE  VISIT  BEFORE  FURTHER  REFILLS. 90 tablet 2  . Blood Glucose Monitoring Suppl (ONETOUCH VERIO) w/Device KIT USE METER TO CHECK GLUCOSE ONCE DAILY AS DIRECTED    . carvedilol (COREG) 12.5 MG tablet TAKE 1 & 1/2 (ONE & ONE-HALF) TABLETS BY MOUTH TWICE DAILY 270 tablet 3  . cetirizine (ZYRTEC) 10 MG tablet Take 10 mg by mouth as needed.    . chlorthalidone (HYGROTON) 25 MG tablet Take 1 tablet by mouth once daily 90 tablet 1  . clopidogrel (PLAVIX) 75 MG tablet Take 1 tablet by mouth once daily 90 tablet 0  . FIBER COMPLETE PO Take 1 Scoop by mouth daily. Whole Food Fiber    . fluticasone (FLONASE) 50 MCG/ACT nasal spray Place 2 sprays into both nostrils daily. 16 g 0  . Homeopathic Products (BODYANEW CLEANSE MULTIPACK) LIQD Take 1 each by mouth daily.    Marland Kitchen losartan (COZAAR) 100 MG tablet Take 1 tablet by mouth once daily 90 tablet 2  . NON FORMULARY vasinox cardiovascular suppor    . NON FORMULARY Take 17 tablets by mouth daily. Glucobalance    . NON FORMULARY Take 4 Scoops by mouth daily. SP Complete Dairy Free Dietary Supplement    . OneTouch Delica Lancets 16R MISC 1 each by Does not apply route 2 (two) times daily. Use to monitor glucose levels BID; E11.9 100 each 3  . OVER THE COUNTER MEDICATION Take 6 tablets by mouth daily. NeuroRenew    . RELION PEN NEEDLES 32G X 4 MM MISC USE 1 ONCE DAILY AS DIRECTED  11  . senna-docusate (SENOKOT-S) 8.6-50 MG per tablet Take 2 tablets by mouth 2 (two) times daily. For constipation.     No current facility-administered medications on file prior to visit.    Allergies  Allergen Reactions  . Penicillins Anaphylaxis  . Pneumococcal Vaccines Anaphylaxis  . Shellfish Allergy Anaphylaxis  . Influenza  Vaccines   . Lisinopril     cough  . Topiramate Other (See Comments)    Chest spasms and numbness  . Amlodipine Other (See Comments)    LE edema    Family History  Problem Relation Age of Onset  . Cancer  Mother     There were no vitals taken for this visit.   Review of Systems He denies hypoglycemia.     Objective:   Physical Exam      Assessment & Plan:  Insulin-requiring type 2 DM, with renal insuff: he needs increased rx   Patient Instructions  Please increase the insulin to 40 units with breakfast.   On this type of insulin schedule, you should eat meals on a regular schedule (especially breakfast and lunch).  If a meal is missed or significantly delayed, your blood sugar could go low.   Please come back for a follow-up appointment in 2 months.   check your blood sugar twice a day.  vary the time of day when you check, between before the 3 meals, and at bedtime.  also check if you have symptoms of your blood sugar being too high or too low.  please keep a record of the readings and bring it to your next appointment here (or you can bring the meter itself).  You can write it on any piece of paper.  please call us sooner if your blood sugar goes below 70, or if you have a lot of readings over 200.

## 2019-12-17 ENCOUNTER — Telehealth: Payer: Self-pay | Admitting: Podiatry

## 2019-12-17 NOTE — Telephone Encounter (Signed)
Pt left message on nurse line asking for a call back about him getting diabetic shoes. His endocrinologist is not in agreement with our doctor. He told pt his circulation test came back normal 15 months ago and therefore he does not meet criteria for the shoes.   I returned call and explained that if his doctor is not in agree with ours and will not sign off on paperwork for pt to get the shoes there is not a lot we can do. The other option is cash pay for 1 pr shoes and 1 pr inserts which is 250.00. Pt did not want to do that. He also asked if there was another endocrinologist we could recommend and I told him to call and see if the practice he is at would let him change internally.

## 2019-12-25 ENCOUNTER — Telehealth: Payer: Self-pay

## 2019-12-25 ENCOUNTER — Other Ambulatory Visit: Payer: Self-pay | Admitting: Adult Health

## 2019-12-25 NOTE — Telephone Encounter (Signed)
Company: [facility])  Document: Rx request for Humalog 50/50 pen Other records requested: None requested  All above requested information has been faxed successfully to [employer] listed above. Documents and fax confirmation have been placed in the faxed file for future reference.

## 2020-02-18 ENCOUNTER — Telehealth: Payer: Self-pay | Admitting: Internal Medicine

## 2020-02-18 NOTE — Progress Notes (Signed)
  Chronic Care Management   Outreach Note  02/18/2020 Name: [name] MRN: [id number] DOB: 1951-06-03  Referred by: Binnie Rail, MD Reason for referral : No chief complaint on file.   An unsuccessful telephone outreach was attempted today. The patient was referred to the pharmacist for assistance with care management and care coordination.   Follow Up Plan:   Raynicia Dukes [employer] Scheduler

## 2020-02-29 ENCOUNTER — Encounter: Payer: Self-pay | Admitting: Podiatry

## 2020-02-29 ENCOUNTER — Ambulatory Visit (INDEPENDENT_AMBULATORY_CARE_PROVIDER_SITE_OTHER): Payer: Medicare HMO | Admitting: Podiatry

## 2020-02-29 ENCOUNTER — Other Ambulatory Visit: Payer: Self-pay

## 2020-02-29 VITALS — Temp 97.9°F

## 2020-02-29 DIAGNOSIS — M79674 Pain in right toe(s): Secondary | ICD-10-CM

## 2020-02-29 DIAGNOSIS — B351 Tinea unguium: Secondary | ICD-10-CM

## 2020-02-29 DIAGNOSIS — E1142 Type 2 diabetes mellitus with diabetic polyneuropathy: Secondary | ICD-10-CM

## 2020-02-29 DIAGNOSIS — M79675 Pain in left toe(s): Secondary | ICD-10-CM

## 2020-02-29 NOTE — Patient Instructions (Signed)
Diabetes Mellitus and Foot Care Foot care is an important part of your health, especially when you have diabetes. Diabetes may cause you to have problems because of poor blood flow (circulation) to your feet and legs, which can cause your skin to:  Become thinner and drier.  Break more easily.  Heal more slowly.  Peel and crack. You may also have nerve damage (neuropathy) in your legs and feet, causing decreased feeling in them. This means that you may not notice minor injuries to your feet that could lead to more serious problems. Noticing and addressing any potential problems early is the best way to prevent future foot problems. How to care for your feet Foot hygiene  Wash your feet daily with warm water and mild soap. Do not use hot water. Then, pat your feet and the areas between your toes until they are completely dry. Do not soak your feet as this can dry your skin.  Trim your toenails straight across. Do not dig under them or around the cuticle. File the edges of your nails with an emery board or nail file.  Apply a moisturizing lotion or petroleum jelly to the skin on your feet and to dry, brittle toenails. Use lotion that does not contain alcohol and is unscented. Do not apply lotion between your toes. Shoes and socks  Wear clean socks or stockings every day. Make sure they are not too tight. Do not wear knee-high stockings since they may decrease blood flow to your legs.  Wear shoes that fit properly and have enough cushioning. Always look in your shoes before you put them on to be sure there are no objects inside.  To break in new shoes, wear them for just a few hours a day. This prevents injuries on your feet. Wounds, scrapes, corns, and calluses  Check your feet daily for blisters, cuts, bruises, sores, and redness. If you cannot see the bottom of your feet, use a mirror or ask someone for help.  Do not cut corns or calluses or try to remove them with medicine.  If you  find a minor scrape, cut, or break in the skin on your feet, keep it and the skin around it clean and dry. You may clean these areas with mild soap and water. Do not clean the area with peroxide, alcohol, or iodine.  If you have a wound, scrape, corn, or callus on your foot, look at it several times a day to make sure it is healing and not infected. Check for: ? Redness, swelling, or pain. ? Fluid or blood. ? Warmth. ? Pus or a bad smell. General instructions  Do not cross your legs. This may decrease blood flow to your feet.  Do not use heating pads or hot water bottles on your feet. They may burn your skin. If you have lost feeling in your feet or legs, you may not know this is happening until it is too late.  Protect your feet from hot and cold by wearing shoes, such as at the beach or on hot pavement.  Schedule a complete foot exam at least once a year (annually) or more often if you have foot problems. If you have foot problems, report any cuts, sores, or bruises to your health care provider immediately. Contact a health care provider if:  You have a medical condition that increases your risk of infection and you have any cuts, sores, or bruises on your feet.  You have an injury that is not   healing.  You have redness on your legs or feet.  You feel burning or tingling in your legs or feet.  You have pain or cramps in your legs and feet.  Your legs or feet are numb.  Your feet always feel cold.  You have pain around a toenail. Get help right away if:  You have a wound, scrape, corn, or callus on your foot and: ? You have pain, swelling, or redness that gets worse. ? You have fluid or blood coming from the wound, scrape, corn, or callus. ? Your wound, scrape, corn, or callus feels warm to the touch. ? You have pus or a bad smell coming from the wound, scrape, corn, or callus. ? You have a fever. ? You have a red line going up your leg. Summary  Check your feet every day  for cuts, sores, red spots, swelling, and blisters.  Moisturize feet and legs daily.  Wear shoes that fit properly and have enough cushioning.  If you have foot problems, report any cuts, sores, or bruises to your health care provider immediately.  Schedule a complete foot exam at least once a year (annually) or more often if you have foot problems. This information is not intended to replace advice given to you by your health care provider. Make sure you discuss any questions you have with your health care provider. Document Revised: 08/01/2019 Document Reviewed: 12/10/2016 Elsevier Patient Education  2020 Elsevier Inc.  

## 2020-03-03 NOTE — Progress Notes (Signed)
Subjective: [name] presents today for follow up of preventative diabetic foot care and painful mycotic nails b/l that are difficult to trim. Pain interferes with ambulation. Aggravating factors include wearing enclosed shoe gear. Pain is relieved with periodic professional debridement.   Allergies  Allergen Reactions  . Penicillins Anaphylaxis  . Pneumococcal Vaccines Anaphylaxis  . Shellfish Allergy Anaphylaxis  . Influenza Vaccines   . Lisinopril     cough  . Topiramate Other (See Comments)    Chest spasms and numbness  . Amlodipine Other (See Comments)    LE edema     Objective: Vitals:   02/29/20 0953  Temp: 97.9 F (36.6 C)    Pt 69 y.o. year old male  in NAD. AAO x 3.   Vascular Examination:  Capillary refill time to digits immediate b/l. Capillary refill time to digits <4 seconds b/l. Palpable DP pulses b/l. Palpable PT pulses b/l. Pedal hair absent b/l Skin temperature gradient within normal limits b/l.  Dermatological Examination: Pedal skin with normal turgor, texture and tone bilaterally. No open wounds bilaterally. No interdigital macerations bilaterally. Toenails 1-5 b/l elongated, dystrophic, thickened, crumbly with subungual debris and tenderness to dorsal palpation.  Musculoskeletal: Normal muscle strength 5/5 to all lower extremity muscle groups bilaterally, no pain crepitus or joint limitation noted with ROM b/l, hammertoes noted to the  2-5 bilaterally, s/p CVA with left sided weakness and utilizes cane for ambulation assistance  Neurological: Protective sensation intact 5/5 intact bilaterally with 10g monofilament b/l Vibratory sensation intact RLE; absent LLE.  Assessment: 1. Pain due to onychomycosis of toenails of both feet   2. Diabetic peripheral neuropathy associated with type 2 diabetes mellitus ([place])    Plan: -Continue diabetic foot care principles. Literature dispensed on today.  -Toenails 1-5 b/l were debrided in length and girth  with sterile nail nippers and dremel without iatrogenic bleeding.  -Patient to continue soft, supportive shoe gear daily. -Patient to report any pedal injuries to medical professional immediately. -Patient/POA to call should there be question/concern in the interim.  Return in about 3 months (around 05/30/2020).

## 2020-03-20 ENCOUNTER — Telehealth: Payer: Self-pay | Admitting: Internal Medicine

## 2020-03-20 DIAGNOSIS — Z9861 Coronary angioplasty status: Secondary | ICD-10-CM

## 2020-03-20 DIAGNOSIS — I2510 Atherosclerotic heart disease of native coronary artery without angina pectoris: Secondary | ICD-10-CM

## 2020-03-20 NOTE — Chronic Care Management (AMB) (Signed)
  Chronic Care Management   Note  03/20/2020 Name: [name] MRN: [id number] DOB: 1950-12-10  [name] is a 69 y.o. year old male who is a primary care patient of Burns, Claudina Lick, MD. I reached out to [name] by phone today in response to a referral sent by [name] PCP, Binnie Rail, MD.   [name] was given information about Chronic Care Management services today including:  1. CCM service includes personalized support from designated clinical staff supervised by his physician, including individualized plan of care and coordination with other care providers 2. 24/7 contact phone numbers for assistance for urgent and routine care needs. 3. Service will only be billed when office clinical staff spend 20 minutes or more in a month to coordinate care. 4. Only one practitioner may furnish and bill the service in a calendar month. 5. The patient may stop CCM services at any time (effective at the end of the month) by phone call to the office staff.   Patient agreed to services and verbal consent obtained.    This note is not being shared with the patient for the following reason: To respect privacy (The patient or proxy has requested that the information not be shared).  Follow up plan:   [name]

## 2020-03-28 ENCOUNTER — Other Ambulatory Visit: Payer: Self-pay | Admitting: Adult Health

## 2020-04-01 ENCOUNTER — Telehealth: Payer: Self-pay | Admitting: Adult Health

## 2020-04-01 ENCOUNTER — Encounter: Payer: Self-pay | Admitting: Cardiovascular Disease

## 2020-04-01 ENCOUNTER — Telehealth: Payer: Self-pay

## 2020-04-01 MED ORDER — CHLORTHALIDONE 25 MG PO TABS: 25.0000 mg | ORAL_TABLET | Freq: Every day | ORAL | 3 refills | Status: DC

## 2020-04-01 MED ORDER — CLOPIDOGREL BISULFATE 75 MG PO TABS: 75.0000 mg | ORAL_TABLET | Freq: Every day | ORAL | 3 refills | Status: DC

## 2020-04-01 NOTE — Telephone Encounter (Signed)
Requested medications refilled, 30 day supply with 3 refills.

## 2020-04-01 NOTE — Telephone Encounter (Signed)
error 

## 2020-04-01 NOTE — Telephone Encounter (Signed)
Pt presented to office and dropped off [employer] application requesting provider to complete provider section. Per Dr. Loanne Drilling, unable to complete forms without an appt. Attempted to call pt to schedule appt but was unable to reach pt d/t VM being full and not accepting incoming messages.

## 2020-04-01 NOTE — Telephone Encounter (Signed)
*  STAT* If patient is at the pharmacy, call can be transferred to refill team.   1. Which medications need to be refilled? (please list name of each medication and dose if known)  clopidogrel (PLAVIX) 75 MG tablet chlorthalidone (HYGROTON) 25 MG tablet  2. Which pharmacy/location (including street and city if local pharmacy) is medication to be sent to? [facility], [place].  3. Do they need a 30 day or 90 day supply? 30 day  Patient is out of medication. He has a Engineer, building services.

## 2020-04-02 ENCOUNTER — Encounter: Payer: Self-pay | Admitting: Adult Health

## 2020-04-02 ENCOUNTER — Telehealth (INDEPENDENT_AMBULATORY_CARE_PROVIDER_SITE_OTHER): Payer: Medicare HMO | Admitting: Adult Health

## 2020-04-02 VITALS — BP 129/70 | HR 69 | Ht 71.0 in | Wt 210.0 lb

## 2020-04-02 DIAGNOSIS — N183 Chronic kidney disease, stage 3 unspecified: Secondary | ICD-10-CM

## 2020-04-02 DIAGNOSIS — I447 Left bundle-branch block, unspecified: Secondary | ICD-10-CM

## 2020-04-02 DIAGNOSIS — E7849 Other hyperlipidemia: Secondary | ICD-10-CM

## 2020-04-02 DIAGNOSIS — I2510 Atherosclerotic heart disease of native coronary artery without angina pectoris: Secondary | ICD-10-CM | POA: Diagnosis not present

## 2020-04-02 DIAGNOSIS — I10 Essential (primary) hypertension: Secondary | ICD-10-CM

## 2020-04-02 MED ORDER — CHLORTHALIDONE 25 MG PO TABS: 25.0000 mg | ORAL_TABLET | Freq: Every day | ORAL | 3 refills | Status: DC

## 2020-04-02 MED ORDER — LOSARTAN POTASSIUM 100 MG PO TABS: 100.0000 mg | ORAL_TABLET | Freq: Every day | ORAL | 3 refills | Status: DC

## 2020-04-02 MED ORDER — CLOPIDOGREL BISULFATE 75 MG PO TABS: 75.0000 mg | ORAL_TABLET | Freq: Every day | ORAL | 3 refills | Status: DC

## 2020-04-02 NOTE — Progress Notes (Signed)
Virtual Visit via Telephone Note   This visit type was conducted due to national recommendations for restrictions regarding the COVID-19 Pandemic (e.g. social distancing) in an effort to limit this patient's exposure and mitigate transmission in our community.  Due to his co-morbid illnesses, this patient is at least at moderate risk for complications without adequate follow up.  This format is felt to be most appropriate for this patient at this time.  The patient did not have access to video technology/had technical difficulties with video requiring transitioning to audio format only (telephone).  All issues noted in this document were discussed and addressed.  No physical exam could be performed with this format.  Please refer to the patient's chart for his  consent to telehealth for [facility].   Date:  04/02/2020   ID:  [name], DOB [birth year], MRN [id number]  Patient Location: Home Provider Location: Home  PCP:  Binnie Rail, MD  Cardiologist:  Skeet Latch, MD  Electrophysiologist:  None   Evaluation Performed:  Follow-Up Visit  Chief Complaint:  Follow Up with Hypertension   History of Present Illness:    [name] is a 69 y.o. 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 Repeat echo April of 2017 EF of 40%-45%.   He has followed up with his endocrinologist, with adjustments in his insulin as he was becoming hypoglycemic and had one syncopal episode a month or so ago. He has not had any since.   On last visit on 02/26/2020 BP was not well controlled. Titration of carvedilol to 25 mg BID was planned. Patient was keeping  up with his BP at home and recorded for each follow up visit.   BP has been having good recordings readings in the 120/70's range.  He does not feel dizzy or lightheaded. HR has been running in the 70's. He exercising 3 times a week with a trainer. He has had his two COVID vaccines. Does have some cough and congestion now.  Is seeing PCP next week for this. He is medically complaint.   The patient does not have symptoms concerning for COVID-19 infection (fever, chills, cough, or new shortness of breath).    Past Medical History:  Diagnosis Date  . ALLERGIC RHINITIS   . CAD, NATIVE VESSEL    BMS to OM1 2001, DES to BMS 2005  . Chronic back pain    herniated disc  . Chronic combined systolic and diastolic heart failure ([place]) 06/29/2016  . Constipation    takes Carafate four times day  . DIAB W/UNSPEC COMP TYPE II/UNSPEC TYPE UNCNTRL   . ERECTILE DYSFUNCTION   . GERD   . HYPERLIPIDEMIA-MIXED   . HYPERTENSION, BENIGN   . LBBB (left bundle branch block) 02/02/2016  . MORTON'S NEUROMA, RIGHT   . Peripheral neuropathy   . Seasonal allergies    takes Allegra and Benadryl daily prn;uses Flonase daily  . SHOULDER PAIN, RIGHT   . 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 TIA on medication 02/2012   Past Surgical History:  Procedure Laterality Date  . ANGIOPLASTY    . COLONOSCOPY    .  CORONARY ANGIOPLASTY  2005   2 stents  . DENTAL SURGERY    . LARYNGOPLASTY  08/07/2012   Procedure: LARYNGOPLASTY;  Surgeon: Izora Gala, MD;  Location: [place];  Service: ENT;  Laterality: Left;  Left Vocal Cord Medialyzation  . left knee surgury     x 2   . stent  2001, 2004   coronary stents     Current Meds  Medication Sig  . atorvastatin (LIPITOR) 40 MG tablet TAKE 1 TABLET BY MOUTH ONCE DAILY .  MUST  HAVE  OFFICE  VISIT  BEFORE  FURTHER  REFILLS.  Marland Kitchen Blood Glucose Monitoring Suppl (ONETOUCH VERIO) w/Device KIT USE METER TO CHECK GLUCOSE ONCE DAILY AS DIRECTED  . carvedilol  (COREG) 12.5 MG tablet TAKE 1 & 1/2 (ONE & ONE-HALF) TABLETS BY MOUTH TWICE DAILY  . cetirizine (ZYRTEC) 10 MG tablet Take 10 mg by mouth as needed.  . chlorthalidone (HYGROTON) 25 MG tablet Take 1 tablet (25 mg total) by mouth daily.  . clopidogrel (PLAVIX) 75 MG tablet Take 1 tablet (75 mg total) by mouth daily. Please schedule annual appt in April for refills. Thank you  . FIBER COMPLETE PO Take 1 Scoop by mouth daily. Whole Food Fiber  . fluticasone (FLONASE) 50 MCG/ACT nasal spray Place 2 sprays into both nostrils daily.  Marland Kitchen glucose blood (ONETOUCH VERIO) test strip 1 each by Other route 2 (two) times daily. And lancets 2/day.  . Insulin Lispro Prot & Lispro (HUMALOG MIX 50/50 KWIKPEN) (50-50) 100 UNIT/ML Kwikpen Inject 40 Units into the skin daily with breakfast.  . losartan (COZAAR) 100 MG tablet Take 1 tablet by mouth once daily  . OneTouch Delica Lancets 03U MISC 1 each by Does not apply route 2 (two) times daily. Use to monitor glucose levels BID; E11.9  . OVER THE COUNTER MEDICATION Take 6 tablets by mouth daily. NeuroRenew  . RELION PEN NEEDLES 32G X 4 MM MISC USE 1 ONCE DAILY AS DIRECTED  . senna-docusate (SENOKOT-S) 8.6-50 MG per tablet Take 2 tablets by mouth 2 (two) times daily. For constipation.     Allergies:   Penicillins, Pneumococcal vaccines, Shellfish allergy, Influenza vaccines, Lisinopril, Topiramate, and Amlodipine   Social History   Tobacco Use  . Smoking status: Former Smoker    Quit date: 02/24/1983    Years since quitting: 37.1  . Smokeless tobacco: Never Used  Substance Use Topics  . Alcohol use: No    Alcohol/week: 0.0 standard drinks  . Drug use: No     Family Hx: The patient's family history includes Cancer in his mother.  ROS:   Please see the history of present illness.    All other systems reviewed and are negative.   Prior CV studies:   The following studies were reviewed today: Echocardiogram 09/20/2018 Left ventricle: abnormal septal  motion. The cavity size was mildly dilated. Wall thickness was increased in a pattern of severe LVH. Systolic function was mildly to moderately reduced. The estimated ejection fraction was in the range of 40% to 45%. Doppler parameters are consistent with abnormal left ventricular relaxation (grade 1 diastolic dysfunction). - Atrial septum: There was increased thickness of the septum, consistent with lipomatous hypertrophy. No defect or patent foramen ovale was identified.  NM Stress Test 11/03/2018   Nuclear stress EF: 43%. The left ventricular ejection fraction is moderately decreased (30-44%).  Defect 1: There is a medium defect of moderate severity present in the mid anteroseptal, mid inferoseptal, apical septal and apex location.  This septal defect is likely due to the LBBB.  This is an intermediate risk study. This study is similar to the previous study in 2017.      Labs/Other Tests and Data Reviewed:    EKG:  No ECG reviewed.  Recent Labs: 10/17/2019: ALT 20; BUN 30; Creatinine, Ser 1.69; Hemoglobin 12.2; Platelets 186.0; Potassium 3.8; Sodium 137; TSH 2.08   Recent Lipid Panel Lab Results  Component Value Date/Time   CHOL 118 10/17/2019 01:29 PM   CHOL 164 07/20/2017 12:02 PM   TRIG 155.0 (H) 10/17/2019 01:29 PM   TRIG 200 04/20/2009 12:00 AM   HDL 44.50 10/17/2019 01:29 PM   HDL 55 07/20/2017 12:02 PM   CHOLHDL 3 10/17/2019 01:29 PM   LDLCALC 43 10/17/2019 01:29 PM   LDLCALC 82 07/20/2017 12:02 PM   LDLDIRECT 63.0 01/12/2016 09:31 AM    Wt Readings from Last 3 Encounters:  04/02/20 210 lb (95.3 kg)  10/17/19 220 lb 12.8 oz (100.2 kg)  10/09/19 218 lb 12.8 oz (99.2 kg)     Objective:    Vital Signs:  BP 129/70   Pulse 69   Ht _0  (1.803 m)   Wt 210 lb (95.3 kg)   BMI 29.29 kg/m    VITAL SIGNS:  reviewed GEN:  no acute distress RESPIRATORY:  normal respiratory effort, symmetric expansion NEURO:  alert and oriented x 3, no  obvious focal deficit PSYCH:  normal affect  ASSESSMENT & PLAN:    1. Hypertension: Much better controlled on carvedilol 25 mg BID, losartan 100 mg daily, and chlorthalidone 25 mg daily. Will continue this regimen as I feel that we have found the right doses for him at this time. He will need follow up BMET if not completed by PCP by our next visit.   2.  CAD: Remains on clopidogrel with no complaints of bleeding or excessive bruising.   3. Hyperlipidemia: Remains on atorvastatin 40 mg daily.Goal of LDL < 70, HDL > 40. He will need follow up fasting lipids and LFTs on next visit if not completed by his PCP  4. NIDDM:Followed by PCP.Consider Januvia if Hgb A1c is not well controlled, to assist with cardioprotective properties.   COVID-19 Education: The signs and symptoms of COVID-19 were discussed with the patient and how to seek care for testing (follow up with PCP or arrange E-visit).  The importance of social distancing was discussed today.  Time:   Today, I have spent 20  minutes with the patient with telehealth technology discussing the above problems.     Medication Adjustments/Labs and Tests Ordered: Current medicines are reviewed at length with the patient today.  Concerns regarding medicines are outlined above.   Tests Ordered: No orders of the defined types were placed in this encounter.   Medication Changes: No orders of the defined types were placed in this encounter.   Disposition:  Follow up 6 months   Signed, Phill Myron. West Pugh, ANP, AACC  04/02/2020 8:28 AM    [facility]

## 2020-04-02 NOTE — Patient Instructions (Signed)
Medication Instructions:  °Continue current medications ° °*If you need a refill on your cardiac medications before your next appointment, please call your pharmacy* ° ° °Lab Work: °None Ordered ° ° °Testing/Procedures: °None Ordered ° ° °Follow-Up: °At [facility], you and your health needs are our priority.  As part of our continuing mission to provide you with exceptional heart care, we have created designated Provider Care Teams.  These Care Teams include your primary Cardiologist (physician) and Advanced Practice Providers (APPs -  Physician Assistants and Nurse Practitioners) who all work together to provide you with the care you need, when you need it. ° °We recommend signing up for the patient portal called "MyChart".  Sign up information is provided on this After Visit Summary.  MyChart is used to connect with patients for Virtual Visits (Telemedicine).  Patients are able to view lab/test results, encounter notes, upcoming appointments, etc.  Non-urgent messages can be sent to your provider as well.   °To learn more about what you can do with MyChart, go to [email or web address].   ° °Your next appointment:   °6 month(s) ° °The format for your next appointment:   °In Person ° °Provider:   °You may see Tiffany McCleary, MD or one of the following Advanced Practice Providers on your designated Care Team:   °· Luke Kilroy, PA-C °· Callie Goodrich, PA-C °· Jesse Cleaver, FNP ° ° ° ° °

## 2020-04-08 ENCOUNTER — Other Ambulatory Visit: Payer: Self-pay

## 2020-04-10 ENCOUNTER — Telehealth: Payer: Self-pay

## 2020-04-10 ENCOUNTER — Encounter: Payer: Self-pay | Admitting: Endocrinology

## 2020-04-10 ENCOUNTER — Other Ambulatory Visit: Payer: Self-pay

## 2020-04-10 ENCOUNTER — Ambulatory Visit (INDEPENDENT_AMBULATORY_CARE_PROVIDER_SITE_OTHER): Payer: Medicare HMO | Admitting: Endocrinology

## 2020-04-10 VITALS — BP 144/70 | HR 96 | Ht 71.0 in | Wt 226.0 lb

## 2020-04-10 DIAGNOSIS — Z794 Long term (current) use of insulin: Secondary | ICD-10-CM | POA: Diagnosis not present

## 2020-04-10 DIAGNOSIS — E119 Type 2 diabetes mellitus without complications: Secondary | ICD-10-CM

## 2020-04-10 LAB — POCT GLYCOSYLATED HEMOGLOBIN (HGB A1C): Hemoglobin A1C: 9 % — AB (ref 4.0–5.6)

## 2020-04-10 MED ORDER — TRULICITY 0.75 MG/0.5ML ~~LOC~~ SOAJ: 0.7500 mg | SUBCUTANEOUS | 3 refills | Status: DC

## 2020-04-10 MED ORDER — HUMALOG MIX 50/50 KWIKPEN (50-50) 100 UNIT/ML ~~LOC~~ SUPN: 32.0000 [IU] | PEN_INJECTOR | Freq: Every day | SUBCUTANEOUS | 11 refills | Status: DC

## 2020-04-10 NOTE — Progress Notes (Signed)
Subjective:    Patient ID: [name], male    DOB: May 21, 1951, 69 y.o.   MRN: [id number]  HPI Pt returns for f/u of diabetes mellitus: DM type: Insulin-requiring type 2 Dx'ed: 0867 Complications: PN, CAD, CRI, DR, and TIA.   Therapy: insulin since 2011 DKA: never.   Severe hypoglycemia: last episode was early 2020.   Pancreatitis: never.  Other: He says he cannot use syringe and vial, due to tremor; he changed lantus to NPH, then 70/30, then 50/50, due to pattern on cbg's; he declines multiple daily injections; he gets insulin from [facility]--pt assistance.     Interval history: He says he never misses the insulin.  no cbg record, but states cbg's vary from 30-211.  It is in general lowest in the afternoon.  pt states he feels well in general.  No recent steroids.  He takes 40 units qam.  Past Medical History:  Diagnosis Date  . ALLERGIC RHINITIS   . CAD, NATIVE VESSEL    BMS to OM1 2001, DES to BMS 2005  . Chronic back pain    herniated disc  . Chronic combined systolic and diastolic heart failure ([place]) 06/29/2016  . Constipation    takes Carafate four times day  . DIAB W/UNSPEC COMP TYPE II/UNSPEC TYPE UNCNTRL   . ERECTILE DYSFUNCTION   . GERD   . HYPERLIPIDEMIA-MIXED   . HYPERTENSION, BENIGN   . LBBB (left bundle branch block) 02/02/2016  . MORTON'S NEUROMA, RIGHT   . Peripheral neuropathy   . Seasonal allergies    takes Allegra and Benadryl daily prn;uses Flonase daily  . SHOULDER PAIN, RIGHT   . 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 TIA on medication 02/2012    Past Surgical History:  Procedure Laterality Date  . ANGIOPLASTY    . COLONOSCOPY    . CORONARY ANGIOPLASTY  2005   2 stents  . DENTAL SURGERY    . LARYNGOPLASTY  08/07/2012   Procedure: LARYNGOPLASTY;  Surgeon: Izora Gala, MD;  Location: [place];  Service: ENT;  Laterality: Left;  Left Vocal Cord Medialyzation  . left knee surgury     x 2   . stent  2001, 2004   coronary  stents    Social History   Socioeconomic History  . Marital status: Legally Separated    Spouse name: Not on file  . Number of children: 3  . Years of education: college  . Highest education level: Not on file  Occupational History  . Occupation: disabled    Employer: [employer]  Tobacco Use  . Smoking status: Former Smoker    Quit date: 02/24/1983    Years since quitting: 37.1  . Smokeless tobacco: Never Used  Substance and Sexual Activity  . Alcohol use: No    Alcohol/week: 0.0 standard drinks  . Drug use: No  . Sexual activity: Never  Other Topics Concern  . Not on file  Social History Narrative      Social Determinants of Health   Financial Resource Strain:   . Difficulty of Paying Living Expenses:   Food Insecurity:   . Worried About Charity fundraiser in the Last Year:   . Arboriculturist in the Last Year:   Transportation Needs:   . Film/video editor (Medical):   Marland Kitchen Lack of Transportation (Non-Medical):   Physical Activity:   . Days of Exercise per Week:   . Minutes of Exercise per  Session:   Stress:   . Feeling of Stress :   Social Connections:   . Frequency of Communication with Friends and Family:   . Frequency of Social Gatherings with Friends and Family:   . Attends Religious Services:   . Active Member of Clubs or Organizations:   . Attends Archivist Meetings:   Marland Kitchen Marital Status:   Intimate Partner Violence:   . Fear of Current or Ex-Partner:   . Emotionally Abused:   Marland Kitchen Physically Abused:   . Sexually Abused:     Current Outpatient Medications on File Prior to Visit  Medication Sig Dispense Refill  . atorvastatin (LIPITOR) 40 MG tablet TAKE 1 TABLET BY MOUTH ONCE DAILY .  MUST  HAVE  OFFICE  VISIT  BEFORE  FURTHER  REFILLS. 90 tablet 2  . Blood Glucose Monitoring Suppl (ONETOUCH VERIO) w/Device KIT USE METER TO CHECK GLUCOSE ONCE DAILY AS DIRECTED    . carvedilol (COREG) 12.5 MG tablet TAKE 1 & 1/2 (ONE & ONE-HALF) TABLETS BY  MOUTH TWICE DAILY 270 tablet 3  . cetirizine (ZYRTEC) 10 MG tablet Take 10 mg by mouth as needed.    . chlorthalidone (HYGROTON) 25 MG tablet Take 1 tablet (25 mg total) by mouth daily. 90 tablet 3  . clopidogrel (PLAVIX) 75 MG tablet Take 1 tablet (75 mg total) by mouth daily. Please schedule annual appt in April for refills. Thank you 90 tablet 3  . FIBER COMPLETE PO Take 1 Scoop by mouth daily. Whole Food Fiber    . fluticasone (FLONASE) 50 MCG/ACT nasal spray Place 2 sprays into both nostrils daily. 16 g 0  . glucose blood (ONETOUCH VERIO) test strip 1 each by Other route 2 (two) times daily. And lancets 2/day. 200 each 3  . losartan (COZAAR) 100 MG tablet Take 1 tablet (100 mg total) by mouth daily. 90 tablet 3  . OneTouch Delica Lancets 54Y MISC 1 each by Does not apply route 2 (two) times daily. Use to monitor glucose levels BID; E11.9 100 each 3  . OVER THE COUNTER MEDICATION Take 6 tablets by mouth daily. NeuroRenew    . RELION PEN NEEDLES 32G X 4 MM MISC USE 1 ONCE DAILY AS DIRECTED  11  . senna-docusate (SENOKOT-S) 8.6-50 MG per tablet Take 2 tablets by mouth 2 (two) times daily. For constipation.     No current facility-administered medications on file prior to visit.    Allergies  Allergen Reactions  . Penicillins Anaphylaxis  . Pneumococcal Vaccines Anaphylaxis  . Shellfish Allergy Anaphylaxis  . Influenza Vaccines   . Lisinopril     cough  . Topiramate Other (See Comments)    Chest spasms and numbness  . Amlodipine Other (See Comments)    LE edema    Family History  Problem Relation Age of Onset  . Cancer Mother     BP (!) 144/70   Pulse 96   Ht '5\' 11"'$  (1.803 m)   Wt 226 lb (102.5 kg)   SpO2 96%   BMI 31.52 kg/m    Review of Systems Denies LOC    Objective:   Physical Exam VITAL SIGNS:  See vs page GENERAL: no distress Pulses: dorsalis pedis intact bilat.   MSK: no deformity of the feet CV: no leg edema Skin:  no ulcer on the feet.  normal color  and temp on the feet.  Neuro: sensation is intact to touch on the feet.    Lab Results  Component  Value Date   CREATININE 1.69 (H) 10/17/2019   BUN 30 (H) 10/17/2019   NA 137 10/17/2019   K 3.8 10/17/2019   CL 100 10/17/2019   CO2 30 10/17/2019    Lab Results  Component Value Date   HGBA1C 9.0 (A) 04/10/2020       Assessment & Plan:  HTN: is noted today Insulin-requiring type 2 DM, with CRI: he needs increased rx Obesity: Trulicity might help.   Patient Instructions  Your blood pressure is high today.  Please see your primary care provider soon, to have it rechecked Please reduce the insulin to 32 units with breakfast, and: Add "Trulicity," once per week.   On this type of insulin schedule, you should eat meals on a regular schedule (especially breakfast and lunch).  If a meal is missed or significantly delayed, your blood sugar could go low.   Please come back for a follow-up appointment in 2 months.   check your blood sugar twice a day.  vary the time of day when you check, between before the 3 meals, and at bedtime.  also check if you have symptoms of your blood sugar being too high or too low.  please keep a record of the readings and bring it to your next appointment here (or you can bring the meter itself).  You can write it on any piece of paper.  please call us sooner if your blood sugar goes below 70, or if you have a lot of readings over 200.

## 2020-04-10 NOTE — Telephone Encounter (Signed)
PATIENT ASSISTANCE PROGRAM  PROVIDER SECTION OF APPLICATION Company: [employer]  Medication(s) ordered: Humalog 44/31 and Trulicity 0.75mg /0.89mL Document: Rx for both medications listed above and Provider Agreement  All above requested information has been faxed successfully to [employer] listed above.   PATIENT SECTION OF APPLICATION PATIENT PORTION of the application given to pt? Pt brought in his completed portion of the application. He requested a copy of his portion of the application for his records. A copy was made and returned to the pt. The original has been retained and faxed to [place]. Educated that missing or incomplete information will be their responsibility to provide to the above mentioned company? Yes Were pt financials included in this fax? Yes  All above information has been faxed successfully to the Company listed above. Documents and fax confirmation have been placed in the faxed file for future reference.

## 2020-04-10 NOTE — Patient Instructions (Addendum)
Your blood pressure is high today.  Please see your primary care provider soon, to have it rechecked Please reduce the insulin to 32 units with breakfast, and: Add "Trulicity," once per week.   On this type of insulin schedule, you should eat meals on a regular schedule (especially breakfast and lunch).  If a meal is missed or significantly delayed, your blood sugar could go low.   Please come back for a follow-up appointment in 2 months.   check your blood sugar twice a day.  vary the time of day when you check, between before the 3 meals, and at bedtime.  also check if you have symptoms of your blood sugar being too high or too low.  please keep a record of the readings and bring it to your next appointment here (or you can bring the meter itself).  You can write it on any piece of paper.  please call us sooner if your blood sugar goes below 70, or if you have a lot of readings over 200.

## 2020-04-14 NOTE — Patient Instructions (Addendum)
  Blood work was ordered.  A Chest xray was ordered.   Medications reviewed and updated.  Changes include :   Add miralax for your constipation ( this is over the counter)     Please followup in 6 months

## 2020-04-14 NOTE — Progress Notes (Signed)
Subjective:    Patient ID: [name], male    DOB: 1951/10/05, 69 y.o.   MRN: [id number]  HPI The patient is here for follow up of their chronic medical problems, including htn, diabetes, hyperlipidemia, h/o cva with residual left sided weakness, CAD, CKD,   Cough - it started months ago.  he coughs a lot and feels like he is going to pass out. He coughs throughout the day and night.  A few days ago he was coughing up green thick phlegm and now it is just dry.   He gets sweats at times. It comes with the cough and separate.  He is exercising 3/ week.    BP in 130's/80 at home  Medications and allergies reviewed with patient and updated if appropriate.  Patient Active Problem List   Diagnosis Date Noted  . Sciatica of left side 07/11/2019  . Cough 07/11/2019  . Edema leg 10/26/2018  . History of stroke 10/26/2018  . DOE (dyspnea on exertion) 10/26/2018  . Left hip pain 07/14/2018  . Acute left ankle pain 07/14/2018  . Chest pain 07/14/2018  . Nonintractable headache 02/24/2018  . CRI (chronic renal insufficiency), stage 3 (moderate) 02/22/2017  . Anemia 02/16/2017  . Chronic combined systolic and diastolic heart failure ([place]) 06/29/2016  . LBBB (left bundle branch block) 02/02/2016  . Nonallopathic lesion of lumbosacral region 12/11/2014  . Nonallopathic lesion of sacral region 12/11/2014  . Nonallopathic lesion of thoracic region 12/11/2014  . Arthritis of left hip 11/20/2014  . Ischial bursitis of left side 10/28/2014  . Hamstring tightness of left lower extremity 09/16/2014  . Piriformis syndrome of left side 09/16/2014  . Dysphonia 06/22/2013  . Hemiparesis affecting left side as late effect of stroke ([place]) 10/10/2012  . Dysphagia following cerebrovascular accident 08/14/2012  . Chronic back pain   . Peripheral neuropathy ([place])   . TIA (transient ischemic attack) 04/08/2012  . Stroke ([place]) 02/21/2012  . [place], RIGHT 05/29/2010  . Diabetes  ([place]) 04/13/2010  . ALLERGIC RHINITIS 04/13/2010  . GERD 04/13/2010  . ERECTILE DYSFUNCTION 03/12/2009  . Essential hypertension 03/12/2009  . CAD S/P percutaneous coronary angioplasty 03/12/2009  . Hyperlipidemia 03/07/2009    Current Outpatient Medications on File Prior to Visit  Medication Sig Dispense Refill  . atorvastatin (LIPITOR) 40 MG tablet TAKE 1 TABLET BY MOUTH ONCE DAILY .  MUST  HAVE  OFFICE  VISIT  BEFORE  FURTHER  REFILLS. 90 tablet 2  . Blood Glucose Monitoring Suppl (ONETOUCH VERIO) w/Device KIT USE METER TO CHECK GLUCOSE ONCE DAILY AS DIRECTED    . carvedilol (COREG) 12.5 MG tablet TAKE 1 & 1/2 (ONE & ONE-HALF) TABLETS BY MOUTH TWICE DAILY 270 tablet 3  . cetirizine (ZYRTEC) 10 MG tablet Take 10 mg by mouth as needed.    . chlorthalidone (HYGROTON) 25 MG tablet Take 1 tablet (25 mg total) by mouth daily. 90 tablet 3  . clopidogrel (PLAVIX) 75 MG tablet Take 1 tablet (75 mg total) by mouth daily. Please schedule annual appt in April for refills. Thank you 90 tablet 3  . Dulaglutide (TRULICITY) 3.41 DQ/2.2WL SOPN Inject 0.75 mg into the skin once a week. 12 pen 3  . FIBER COMPLETE PO Take 1 Scoop by mouth daily. Whole Food Fiber    . fluticasone (FLONASE) 50 MCG/ACT nasal spray Place 2 sprays into both nostrils daily. 16 g 0  . glucose blood (ONETOUCH VERIO) test strip 1 each by Other route 2 (two)  times daily. And lancets 2/day. 200 each 3  . Insulin Lispro Prot & Lispro (HUMALOG MIX 50/50 KWIKPEN) (50-50) 100 UNIT/ML Kwikpen Inject 32 Units into the skin daily with breakfast. 15 mL 11  . losartan (COZAAR) 100 MG tablet Take 1 tablet (100 mg total) by mouth daily. 90 tablet 3  . OneTouch Delica Lancets 92E MISC 1 each by Does not apply route 2 (two) times daily. Use to monitor glucose levels BID; E11.9 100 each 3  . OVER THE COUNTER MEDICATION Take 6 tablets by mouth daily. NeuroRenew    . RELION PEN NEEDLES 32G X 4 MM MISC USE 1 ONCE DAILY AS DIRECTED  11  .  senna-docusate (SENOKOT-S) 8.6-50 MG per tablet Take 2 tablets by mouth 2 (two) times daily. For constipation.     No current facility-administered medications on file prior to visit.    Past Medical History:  Diagnosis Date  . ALLERGIC RHINITIS   . CAD, NATIVE VESSEL    BMS to OM1 2001, DES to BMS 2005  . Chronic back pain    herniated disc  . Chronic combined systolic and diastolic heart failure ([place]) 06/29/2016  . Constipation    takes Carafate four times day  . DIAB W/UNSPEC COMP TYPE II/UNSPEC TYPE UNCNTRL   . ERECTILE DYSFUNCTION   . GERD   . HYPERLIPIDEMIA-MIXED   . HYPERTENSION, BENIGN   . LBBB (left bundle branch block) 02/02/2016  . MORTON'S NEUROMA, RIGHT   . Peripheral neuropathy   . Seasonal allergies    takes Allegra and Benadryl daily prn;uses Flonase daily  . SHOULDER PAIN, RIGHT   . Stroke, thrombotic ([place]) 07/2012   L HP + hemiparesis, s/p CIR   . TIA on medication 02/2012    Past Surgical History:  Procedure Laterality Date  . ANGIOPLASTY    . COLONOSCOPY    . CORONARY ANGIOPLASTY  2005   2 stents  . DENTAL SURGERY    . LARYNGOPLASTY  08/07/2012   Procedure: LARYNGOPLASTY;  Surgeon: Izora Gala, MD;  Location: [facility];  Service: ENT;  Laterality: Left;  Left Vocal Cord Medialyzation  . left knee surgury     x 2   . stent  2001, 2004   coronary stents    Social History   Socioeconomic History  . Marital status: Legally Separated    Spouse name: Not on file  . Number of children: 3  . Years of education: college  . Highest education level: Not on file  Occupational History  . Occupation: disabled    Employer: [employer]  Tobacco Use  . Smoking status: Former Smoker    Quit date: 02/24/1983    Years since quitting: 37.1  . Smokeless tobacco: Never Used  Substance and Sexual Activity  . Alcohol use: No    Alcohol/week: 0.0 standard drinks  . Drug use: No  . Sexual activity: Never  Other Topics Concern  . Not on file  Social History Narrative        Social Determinants of Health   Financial Resource Strain:   . Difficulty of Paying Living Expenses:   Food Insecurity:   . Worried About Charity fundraiser in the Last Year:   . Arboriculturist in the Last Year:   Transportation Needs:   . Film/video editor (Medical):   Marland Kitchen Lack of Transportation (Non-Medical):   Physical Activity:   . Days of Exercise per Week:   . Minutes of Exercise per Session:  Stress:   . Feeling of Stress :   Social Connections:   . Frequency of Communication with Friends and Family:   . Frequency of Social Gatherings with Friends and Family:   . Attends Religious Services:   . Active Member of Clubs or Organizations:   . Attends Archivist Meetings:   Marland Kitchen Marital Status:     Family History  Problem Relation Age of Onset  . Cancer Mother     Review of Systems  Constitutional: Positive for diaphoresis. Negative for fever.  HENT: Positive for postnasal drip (one week ago), sinus pain and sore throat. Negative for congestion.   Respiratory: Positive for cough, shortness of breath (chronic, worse) and wheezing (at night some times).   Cardiovascular: Positive for chest pain (occ, resolved with tums) and leg swelling (sometimes). Negative for palpitations.  Gastrointestinal: Positive for constipation.  Skin: Positive for rash (chest x 4 months - itchy, better with hemp oil).  Neurological: Positive for light-headedness and headaches.       Objective:   Vitals:   04/15/20 1049  BP: (!) 144/80  Pulse: 68  Resp: 16  Temp: 98.5 F (36.9 C)  SpO2: 99%   BP Readings from Last 3 Encounters:  04/15/20 (!) 144/80  04/10/20 (!) 144/70  04/02/20 129/70   Wt Readings from Last 3 Encounters:  04/15/20 226 lb (102.5 kg)  04/10/20 226 lb (102.5 kg)  04/02/20 210 lb (95.3 kg)   Body mass index is 31.52 kg/m.   Physical Exam    Constitutional: Appears well-developed and well-nourished. No distress.  HENT:  Head: Normocephalic  and atraumatic.  Neck: Neck supple. No tracheal deviation present. No thyromegaly present.  No cervical lymphadenopathy Cardiovascular: Normal rate, regular rhythm and normal heart sounds.  No murmur heard. No carotid bruit .  No edema Pulmonary/Chest: Effort normal and breath sounds normal. No respiratory distress. No has no wheezes. No rales.  Skin: Skin is warm and dry. Not diaphoretic.  Psychiatric: Normal mood and affect. Behavior is normal.      Assessment & Plan:    See Problem List for Assessment and Plan of chronic medical problems.    This visit occurred during the SARS-CoV-2 public health emergency.  Safety protocols were in place, including screening questions prior to the visit, additional usage of staff PPE, and extensive cleaning of exam room while observing appropriate contact time as indicated for disinfecting solutions.

## 2020-04-15 ENCOUNTER — Ambulatory Visit (INDEPENDENT_AMBULATORY_CARE_PROVIDER_SITE_OTHER)
Admission: RE | Admit: 2020-04-15 | Discharge: 2020-04-15 | Disposition: A | Discharge status: Home / Self Care | Payer: Medicare HMO | Source: Ambulatory Visit | Attending: Internal Medicine | Admitting: Internal Medicine

## 2020-04-15 ENCOUNTER — Encounter: Payer: Self-pay | Admitting: Internal Medicine

## 2020-04-15 ENCOUNTER — Ambulatory Visit (INDEPENDENT_AMBULATORY_CARE_PROVIDER_SITE_OTHER): Payer: Medicare HMO | Admitting: Internal Medicine

## 2020-04-15 ENCOUNTER — Other Ambulatory Visit: Payer: Self-pay

## 2020-04-15 ENCOUNTER — Other Ambulatory Visit: Payer: Self-pay | Admitting: Internal Medicine

## 2020-04-15 VITALS — BP 144/80 | HR 68 | Temp 98.5°F | Resp 16 | Ht 71.0 in | Wt 226.0 lb

## 2020-04-15 DIAGNOSIS — E7849 Other hyperlipidemia: Secondary | ICD-10-CM

## 2020-04-15 DIAGNOSIS — R05 Cough: Secondary | ICD-10-CM

## 2020-04-15 DIAGNOSIS — N183 Chronic kidney disease, stage 3 unspecified: Secondary | ICD-10-CM | POA: Diagnosis not present

## 2020-04-15 DIAGNOSIS — E119 Type 2 diabetes mellitus without complications: Secondary | ICD-10-CM | POA: Diagnosis not present

## 2020-04-15 DIAGNOSIS — R059 Cough, unspecified: Secondary | ICD-10-CM

## 2020-04-15 DIAGNOSIS — I69354 Hemiplegia and hemiparesis following cerebral infarction affecting left non-dominant side: Secondary | ICD-10-CM

## 2020-04-15 DIAGNOSIS — I10 Essential (primary) hypertension: Secondary | ICD-10-CM | POA: Diagnosis not present

## 2020-04-15 DIAGNOSIS — Z794 Long term (current) use of insulin: Secondary | ICD-10-CM

## 2020-04-15 LAB — COMPREHENSIVE METABOLIC PANEL
ALT: 23 U/L (ref 0–53)
AST: 19 U/L (ref 0–37)
Albumin: 4.3 g/dL (ref 3.5–5.2)
Alkaline Phosphatase: 74 U/L (ref 39–117)
BUN: 34 mg/dL — ABNORMAL HIGH (ref 6–23)
CO2: 29 mEq/L (ref 19–32)
Calcium: 9.6 mg/dL (ref 8.4–10.5)
Chloride: 101 mEq/L (ref 96–112)
Creatinine, Ser: 1.95 mg/dL — ABNORMAL HIGH (ref 0.40–1.50)
GFR: 41.31 mL/min — ABNORMAL LOW (ref >60.00)
Glucose, Bld: 219 mg/dL — ABNORMAL HIGH (ref 70–99)
Potassium: 4.3 mEq/L (ref 3.5–5.1)
Sodium: 136 mEq/L (ref 135–145)
Total Bilirubin: 0.9 mg/dL (ref 0.2–1.2)
Total Protein: 7.9 g/dL (ref 6.0–8.3)

## 2020-04-15 LAB — CBC WITH DIFFERENTIAL/PLATELET
Basophils Absolute: 0 10*3/uL (ref 0.0–0.1)
Basophils Relative: 0.6 % (ref 0.0–3.0)
Eosinophils Absolute: 0.4 10*3/uL (ref 0.0–0.7)
Eosinophils Relative: 5.9 % — ABNORMAL HIGH (ref 0.0–5.0)
HCT: 35 % — ABNORMAL LOW (ref 39.0–52.0)
Hemoglobin: 11.8 g/dL — ABNORMAL LOW (ref 13.0–17.0)
Lymphocytes Relative: 33.8 % (ref 12.0–46.0)
Lymphs Abs: 2.5 10*3/uL (ref 0.7–4.0)
MCHC: 33.6 g/dL (ref 30.0–36.0)
MCV: 93.6 fl (ref 78.0–100.0)
Monocytes Absolute: 0.8 10*3/uL (ref 0.1–1.0)
Monocytes Relative: 11.2 % (ref 3.0–12.0)
Neutro Abs: 3.6 10*3/uL (ref 1.4–7.7)
Neutrophils Relative %: 48.5 % (ref 43.0–77.0)
Platelets: 197 10*3/uL (ref 150.0–400.0)
RBC: 3.74 Mil/uL — ABNORMAL LOW (ref 4.22–5.81)
RDW: 12.9 % (ref 11.5–15.5)
WBC: 7.4 10*3/uL (ref 4.0–10.5)

## 2020-04-15 LAB — LIPID PANEL
Cholesterol: 112 mg/dL (ref 0–200)
HDL: 44.6 mg/dL (ref >39.00)
LDL Cholesterol: 40 mg/dL (ref 0–99)
NonHDL: 67.66
Total CHOL/HDL Ratio: 3
Triglycerides: 136 mg/dL (ref 0.0–149.0)
VLDL: 27.2 mg/dL (ref 0.0–40.0)

## 2020-04-15 MED ORDER — DOXYCYCLINE HYCLATE 100 MG PO TABS: 100.0000 mg | ORAL_TABLET | Freq: Two times a day (BID) | ORAL | 0 refills | Status: DC

## 2020-04-15 NOTE — Assessment & Plan Note (Signed)
Chronic cmp 

## 2020-04-15 NOTE — Assessment & Plan Note (Signed)
Chronic Check lipid panel  Continue daily statin Regular exercise and healthy diet encouraged  

## 2020-04-15 NOTE — Assessment & Plan Note (Signed)
Chronic BP well controlled at home, slightly elevated here today Current regimen effective and well tolerated Continue current medications at current doses cmp,cbc

## 2020-04-15 NOTE — Assessment & Plan Note (Signed)
Chronic Sugars not ideally controlled management per Dr Loanne Drilling - medications recently adjusted Continue regular exercise

## 2020-04-15 NOTE — Assessment & Plan Note (Signed)
Chronic BP controlled at home Lipids have been controlled, recheck lipids today Continue statin, plavix, BP meds Continue exercise

## 2020-04-15 NOTE — Assessment & Plan Note (Signed)
Acute States it started months ago Cough was productive, but not dry Some sweats He is concerned about mesothelioma ? Sinus infection, PNA, less likely asbestos/mesothelioma given normal cxr a few years ago cxr today  Consider abx for possible sinus infection depending on cxr

## 2020-04-16 ENCOUNTER — Encounter: Payer: Self-pay | Admitting: Internal Medicine

## 2020-04-17 NOTE — Addendum Note (Signed)
Addended by: Aviva Signs M on: 04/17/2020 04:50 PM   Modules accepted: Orders

## 2020-04-22 NOTE — Telephone Encounter (Signed)
Patient called to advise that per [employer] they received his application, but "we did not send page 4".   I advised that we sent all and that his portion was incomplete and unsigned as per the multiple notes in chart, however he has requested that we resend all pages to [facility] including page 4.  I did advise that he needs to complete his part (page 4) and that his will be coming to him in the mail, but he still wants [place] to resend.

## 2020-04-22 NOTE — Chronic Care Management (AMB) (Signed)
Chronic Care Management Pharmacy  Name: [name]  MRN: [id number] DOB: 05/15/1951  Chief Complaint/ HPI  [name],  69 y.o. , male presents for their Initial CCM visit with the clinical pharmacist via telephone due to COVID-19 Pandemic.  PCP : Binnie Rail, MD  Their chronic conditions include: Hypertension, Hyperlipidemia, Diabetes, Heart Failure, Coronary Artery Disease, GERD, Chronic Kidney Disease, Osteoarthritis and Neuropathy  Works out 3 days per week with friend - physical trainer. Weight training, treadmill. Goes to a restaurant few days a week - [place], [place]. Lives with daughter for a year now.   Office Visits: 04/15/20 Dr Quay Burow OV: c/o cough x months. Ordered CXR. Kidney functioning worsening - emphasize hydration and DM/BP control. no med changes.  Consult Visit: 04/10/20 Dr Loanne Drilling (endocrine): reduce insulin to 32 units with breakfast, start Trulicity. Pt attempting [employer] PAP.   04/02/20 NP Purcell Nails (cardiology): BP control improved, no med changes.   Allergies  Allergen Reactions  . Penicillins Anaphylaxis  . Pneumococcal Vaccines Anaphylaxis  . Shellfish Allergy Anaphylaxis  . Influenza Vaccines   . Lisinopril     cough  . Topiramate Other (See Comments)    Chest spasms and numbness  . Amlodipine Other (See Comments)    LE edema   Medications: Outpatient Encounter Medications as of 04/23/2020  Medication Sig  . atorvastatin (LIPITOR) 40 MG tablet TAKE 1 TABLET BY MOUTH ONCE DAILY .  MUST  HAVE  OFFICE  VISIT  BEFORE  FURTHER  REFILLS.  Marland Kitchen Blood Glucose Monitoring Suppl (ONETOUCH VERIO) w/Device KIT USE METER TO CHECK GLUCOSE ONCE DAILY AS DIRECTED  . carvedilol (COREG) 12.5 MG tablet TAKE 1 & 1/2 (ONE & ONE-HALF) TABLETS BY MOUTH TWICE DAILY  . cetirizine (ZYRTEC) 10 MG tablet Take 10 mg by mouth as needed.  . chlorthalidone (HYGROTON) 25 MG tablet Take 1 tablet (25 mg total) by mouth daily.  . clopidogrel (PLAVIX) 75 MG tablet  Take 1 tablet (75 mg total) by mouth daily. Please schedule annual appt in April for refills. Thank you  . doxycycline (VIBRA-TABS) 100 MG tablet Take 1 tablet (100 mg total) by mouth 2 (two) times daily.  Marland Kitchen FIBER COMPLETE PO Take 1 Scoop by mouth daily. Whole Food Fiber  . fluticasone (FLONASE) 50 MCG/ACT nasal spray Place 2 sprays into both nostrils daily.  Marland Kitchen glucose blood (ONETOUCH VERIO) test strip 1 each by Other route 2 (two) times daily. And lancets 2/day.  . Insulin Lispro Prot & Lispro (HUMALOG MIX 50/50 KWIKPEN) (50-50) 100 UNIT/ML Kwikpen Inject 32 Units into the skin daily with breakfast.  . losartan (COZAAR) 100 MG tablet Take 1 tablet (100 mg total) by mouth daily.  [name] Delica Lancets 38T MISC 1 each by Does not apply route 2 (two) times daily. Use to monitor glucose levels BID; E11.9  . OVER THE COUNTER MEDICATION Take 6 tablets by mouth daily. NeuroRenew  . RELION PEN NEEDLES 32G X 4 MM MISC USE 1 ONCE DAILY AS DIRECTED  . senna-docusate (SENOKOT-S) 8.6-50 MG per tablet Take 2 tablets by mouth 2 (two) times daily. For constipation.  . Dulaglutide (TRULICITY) 7.71 HA/5.7XU SOPN Inject 0.75 mg into the skin once a week. (Patient not taking: Reported on 04/23/2020)   No facility-administered encounter medications on file as of 04/23/2020.     Current Diagnosis/Assessment:  SDOH Interventions     Most Recent Value  SDOH Interventions  Physical Activity Interventions  Intervention Not Indicated     Goals  Addressed            This Visit's Progress   . Pharmacy Care Plan       CARE PLAN ENTRY  Current Barriers:  . Chronic Disease Management support, education, and care coordination needs related to Hypertension, Hyperlipidemia, Diabetes, Heart Failure, and Coronary Artery Disease   Hypertension/Heart Failure BP Readings from Last 3 Encounters:  04/15/20 (!) 144/80  04/10/20 (!) 144/70  04/02/20 129/70 .  Pharmacist Clinical Goal(s): o Over the next 30 days,  patient will work with PharmD and providers to achieve BP goal <140/90 . Current regimen:  o Carvedilol 12.5 mg BID o Chlorthalidone 25 mg daily o Losartan 100 mg daily . Interventions: o Discussed benefits of medications for controlling BP and heart failure, and prevention of heart attack and stroke . Patient self care activities - Over the next 30 days, patient will: o Check BP daily, document, and provide at future appointments o Ensure daily salt intake < 2300 mg/day  Hyperlipidemia/Coronary artery disease Lipid Panel     Component Value Date/Time   CHOL 112 04/15/2020 1128   TRIG 136.0 04/15/2020 1128   HDL 44.60 04/15/2020 1128   LDLCALC 40 04/15/2020 1128   Pharmacist Clinical Goal(s): o Over the next 30 days, patient will work with PharmD and providers to maintain LDL goal < 70 . Current regimen:  o Atorvastatin 40 mg daily o Clopidogrel 75 mg daily . Interventions: o Discussed benefits of medications for prevention of heart attack and stroke . Patient self care activities - Over the next 30 days, patient will: o Continue medication as prescribed o Continue low cholesterol diet and exercise routine  Diabetes Lab Results  Component Value Date/Time   HGBA1C 9.0 (A) 04/10/2020 01:12 PM   HGBA1C 9.0 (A) 10/09/2019 10:01 AM   HGBA1C 7.7 (H) 08/28/2018 10:12 AM   HGBA1C 9.3 (H) 02/24/2018 11:09 AM .  Pharmacist Clinical Goal(s): o Over the next 30 days, patient will work with PharmD and providers to achieve A1c goal <8% . Current regimen:  o Trulicity 6.29 mg once weekly o Humalog mix 50/50 32 units with breakfast . Interventions: o Discussed benefits of Trulicity, including U7M lowering, weight loss, insulin dose reduction, and cardiovascular risk reduction o Discussed potential side effects of Trulicty, including nausea, which can be prevented by eating smaller, more frequent meals . Patient self care activities - Over the next 30 days, patient will: o Check blood  sugar twice daily, document, and provide at future appointments o Contact provider with any episodes of hypoglycemia o Contact [employer] about Trulicity approval  Medication management . Pharmacist Clinical Goal(s): o Over the next 30 days, patient will work with PharmD and providers to achieve optimal medication adherence . Current pharmacy: [facility] . Interventions o Comprehensive medication review performed. o Continue current medication management strategy . Patient self care activities - Over the next 30 days, patient will: o Focus on medication adherence by fill date o Take medications as prescribed o Report any questions or concerns to PharmD and/or provider(s)  Initial goal documentation        Heart Failure/Hypertension   HF Type: Combined Systolic and Diastolic Last ejection fraction: 40-45% (09/20/2018)  BP goal is:  <140/90  Office blood pressures are  BP Readings from Last 3 Encounters:  04/15/20 (!) 144/80  04/10/20 (!) 144/70  04/02/20 129/70   Kidney Function Lab Results  Component Value Date/Time   CREATININE 1.95 (H) 04/15/2020 11:28 AM   CREATININE 1.69 (  H) 10/17/2019 01:29 PM   GFR 41.31 (L) 04/15/2020 11:28 AM   GFRNONAA 45 (L) 07/20/2017 12:02 PM   GFRAA 52 (L) 07/20/2017 12:02 PM   Patient checks BP at home daily Patient home BP readings are ranging: 130/80  Patient has failed these meds in past: n/a Patient is currently controlled on the following medications:   Carvedilol 12.5 mg BID   Chlorthalidone 25 mg daily  Losartan 100 mg daily  We discussed diet and exercise extensively; benefits of medications and lifelong duration of treatment; pt exercises 3 days per week with personal trainer.  Plan  Continue current medications and control with diet and exercise   Hyperlipidemia/CAD   Lipid Panel     Component Value Date/Time   CHOL 112 04/15/2020 1128   CHOL 164 07/20/2017 1202   TRIG 136.0 04/15/2020 1128   TRIG 200  04/20/2009 0000   HDL 44.60 04/15/2020 1128   HDL 55 07/20/2017 1202   LDLCALC 40 04/15/2020 1128    The ASCVD Risk score (Goff DC Jr., et al., 2013) failed to calculate for the following reasons:   The patient has a prior MI or stroke diagnosis  CAD - BMS 2001, DES 2005; stroke 2013  Patient has failed these meds in past: n/a Patient is currently controlled on the following medications:  . Atorvastatin 40 mg daily . Clopidogrel 75 mg daily HS  We discussed:  diet and exercise extensively; benefits of statin and clopidogrel for secondary prevention  Plan  Continue current medications and control with diet and exercise  Diabetes   Recent Relevant Labs: Lab Results  Component Value Date/Time   HGBA1C 9.0 (A) 04/10/2020 01:12 PM   HGBA1C 9.0 (A) 10/09/2019 10:01 AM   HGBA1C 7.7 (H) 08/28/2018 10:12 AM   HGBA1C 9.3 (H) 02/24/2018 11:09 AM   GFR 41.31 (L) 04/15/2020 11:28 AM   GFR 48.79 (L) 10/17/2019 01:29 PM   MICROALBUR 7.5 (H) 08/17/2016 10:30 AM   MICROALBUR 9.6 (H) 08/07/2015 11:17 AM    Last diabetic Eye exam:  Lab Results  Component Value Date/Time   HMDIABEYEEXA Retinopathy (A) 09/23/2017 12:00 AM    Last diabetic Foot exam:  Lab Results  Component Value Date/Time   HMDIABFOOTEX done 11/30/2019 12:00 AM    Checking BG: 2x per Day  Recent FBG Readings: 187-206 Reports hypoglycemic events once a week, in the 50s, typically around lunchtime after exercising.   Patient has failed these meds in past: n/a Patient is currently uncontrolled on the following medications: . Trulicity 3.29 mg once weekly  . Humalog mix 50/50 32 units with breakfast  We discussed: diet and exercise extensively and how to recognize and treat signs of hypoglycemia; pt has hypoglycemia < 70 once a week, typically after exercising, and treats with sweet tea. He did have 1 episode of severe hypoglycemia 1 year ago that required EMS, BG was 28. Discussed Trulicity at length - mechanism, side  effects, efficacy/benefits. Pt is awaiting PAP approval through [employer] so he has not started it yet.   Plan  Continue current medications and control with diet and exercise  Allergies   Patient has failed these meds in past: n/a Patient is currently controlled on the following medications:  . Cetirizine 10 mg daily . Flonase nasal spray prn  We discussed:  Patient is satisfied with current allergy regimen and denies issues  Plan  Continue current medications   Constipation   Patient is currently uncontrolled on the following medications:  .  Senna-docusate 2 tabs BID . Whole Food Fiber once daily . Miralax once daily  We discussed: Pt reports BM every 3-4 days, he used to have up to 2-3 daily. He takes bowel regimen as above and has not noticed much improvement. Counseled on adequate hydration and fiber intake. Recommended to f/u with PCP if constipation does not improve.  Plan  Continue current medications    Medication Management   Pt uses [facility] pharmacy for all medications Uses pill box? No - keeps bottles in container separated by day night Pt endorses 99% compliance  We discussed: [facility] is the preferred [facility] by [facility].    Plan  Continue current medication management strategy    Follow up: 1 month phone visit  Charlene Brooke, PharmD Clinical Pharmacist [facility] [phone]

## 2020-04-22 NOTE — Telephone Encounter (Signed)
Received notification from [employer] that pt failed to sign his portion of the application. This letter and his portion of the application has been mailed to the patient advising him to sign his portion of the application and to mail back to the address on the letter indicated on the letter from [place].  NOTE - a copy of the letter received from Ralph Leyden has been retained for our records.

## 2020-04-22 NOTE — Telephone Encounter (Signed)
Unable to re-send page 4 of the application because patient failed to complete this portion. This portion has been mailed back to the patient for HIM to complete and for HIM to return. Please refer below to PAGE 4 requirements as it does not pertain to the requirements of this office:  PATIENT CERTIFICATION (AGREEMENT) I understand that:  [facility] [employer] [employer] will decide if I qualify for the Program. I understand that my application might not be approved.  [employer] may change or end the Program, or terminate my enrollment in the Program, at any time.  [employer] does not charge a fee to apply for participation in the Program. I am not required to use a third party who charges a fee to help with my enrollment, and if I use a third party who charges a fee to help with my enrollment or refills of my medication, this money is not paid to [employer].  If approved, my enrollment in the Program will expire at the end of the calendar year (if I am a Medicare Part D patient) or after 12 months. After my enrollment expires, I will need to reapply to the Program. I certify (agree) that:  I am a permanent, legal resident of the [place], [place], or [place].  My application is complete and accurate. I have been truthful about my insurance coverage and income.  I will promptly provide documentation that proves the information I have provided in this application, if needed by [facility], including after any decision regarding qualification for the Program (failure to promptly provide complete and accurate documentation when requested may result in immediate termination of application review or removal from the Program if application has already been approved).  If my application is approved: o I will notify [employer] of changes to my income or insurance status. o I will not submit any claim for reimbursement to any third party or government insurer for any product provided to me through the  [facility]. o If I have Medicare Part D coverage, I will not seek to have the cost/value associated with the medication I receive through the Program counted as out-of-pocket costs for prescription drugs. o If I have Medicare Part D coverage, I will inform my Part D Plan about my enrollment in [employer]. o I will not sell, trade, or transfer any medication I receive through the Program. I consenttothe sharing, use, andreceiptofinformationaboutme,as described: To run [employer], [employer] needs some information about you. When you sign below, you are authorizing any pharmacy, healthcare provider, and or others who are in possession of your personal information, including health information, to share information about you with [employer], Production manager, and their affiliates, employees, agents, vendors, and business partners who may be assisting with the administration of [employer]"), including health information; in addition, you understand and are authorizing the Receiving Entities to share, use, and disclose your information for the purposes of operating the program. The Receiving Entities may receive, share, and use the following information:  Information in this application.  Information about your medical conditions, treatment, current and future medications, and insurance information.  Other information the Receiving Entities may obtain to operate [employer].  The Receiving Entities may share your information with your healthcare providers and pharmacists.  Your healthcare providers and pharmacists may share your information with the Receiving Entities. The Receiving Entities may share your information for the following purposes:  To review your application to determine  your eligibility and to contact you or your healthcare provider, if necessary, for that review.  To help operate [employer] and for the Receiving Ent[employer]' internal purposes involving other  patient assistance and charitable programs.  To your pharmacies and healthcare providers relating to your participation in [facility], including personal information and information about your prescription medications.  Track use of medication.  To measure program performance and make program improvements  We only ask for and share the PHI that we need to operate the program. We do not ask for any PHI that we don't need, but we may receive some in health records sent to [place].  You don't have to give permission to share your PHI with [facility], but we may not be able to assist you without it. By my signature below, I also agree to the following:  This authorization allows those who rely on it to release my Protected Health Information for 1 year from the date I have signed it.  After your PHI has been shared, it may no longer be covered by federal and state privacy laws (such as HIPAA), and it may be shared again.  I understand that I can cancel my consent at any time by sending a written notice to [facility] at the address on this application. If I cancel my consent, I will no longer qualify for [employer]. My healthcare providers will no longer share my PHI with the Receiving Entities after the date that the Receiving Entities receive and process my cancellation letter, but this will not affect information or disclosures shared before that time. Additionally, once my cancellation is received and processed by the Receiving Entities, my participation in [facility] will be terminated, and after my participation is terminated, the Receiving Entities will only maintain and use my information for legal and regulatory purposes.  I have been provided a copy of this authorization. PatientorLegalGuardianSignature: Date: Airline pilot Required) Patient Printed Name:  [facility] Patient Assistance Program  [address] [place] [address] Phone: [phone]  Fax: [phone]   [email or web address] [email or web address] 02/2020  [employer] 2021. All rights reserved.

## 2020-04-23 ENCOUNTER — Other Ambulatory Visit: Payer: Self-pay

## 2020-04-23 ENCOUNTER — Ambulatory Visit: Payer: Medicare HMO | Admitting: Pharmacist

## 2020-04-23 DIAGNOSIS — Z9861 Coronary angioplasty status: Secondary | ICD-10-CM

## 2020-04-23 DIAGNOSIS — E1140 Type 2 diabetes mellitus with diabetic neuropathy, unspecified: Secondary | ICD-10-CM

## 2020-04-23 DIAGNOSIS — IMO0002 Reserved for concepts with insufficient information to code with codable children: Secondary | ICD-10-CM

## 2020-04-23 DIAGNOSIS — E7849 Other hyperlipidemia: Secondary | ICD-10-CM

## 2020-04-23 DIAGNOSIS — I5042 Chronic combined systolic (congestive) and diastolic (congestive) heart failure: Secondary | ICD-10-CM

## 2020-04-23 DIAGNOSIS — I2510 Atherosclerotic heart disease of native coronary artery without angina pectoris: Secondary | ICD-10-CM

## 2020-04-23 DIAGNOSIS — I10 Essential (primary) hypertension: Secondary | ICD-10-CM

## 2020-04-23 NOTE — Patient Instructions (Addendum)
Visit Information  Thank you for meeting with me to discuss your medications! I look forward to working with you to achieve your health care goals. Below is a summary of what we talked about during the visit:  Goals Addressed            This Visit's Progress   . Pharmacy Care Plan       CARE PLAN ENTRY  Current Barriers:  . Chronic Disease Management support, education, and care coordination needs related to Hypertension, Hyperlipidemia, Diabetes, Heart Failure, and Coronary Artery Disease   Hypertension/Heart Failure BP Readings from Last 3 Encounters:  04/15/20 (!) 144/80  04/10/20 (!) 144/70  04/02/20 129/70 .  Pharmacist Clinical Goal(s): o Over the next 30 days, patient will work with PharmD and providers to achieve BP goal <140/90 . Current regimen:  o Carvedilol 12.5 mg BID o Chlorthalidone 25 mg daily o Losartan 100 mg daily . Interventions: o Discussed benefits of medications for controlling BP and heart failure, and prevention of heart attack and stroke . Patient self care activities - Over the next 30 days, patient will: o Check BP daily, document, and provide at future appointments o Ensure daily salt intake < 2300 mg/day  Hyperlipidemia/Coronary artery disease Lipid Panel     Component Value Date/Time   CHOL 112 04/15/2020 1128   TRIG 136.0 04/15/2020 1128   HDL 44.60 04/15/2020 1128   LDLCALC 40 04/15/2020 1128   Pharmacist Clinical Goal(s): o Over the next 30 days, patient will work with PharmD and providers to maintain LDL goal < 70 . Current regimen:  o Atorvastatin 40 mg daily o Clopidogrel 75 mg daily . Interventions: o Discussed benefits of medications for prevention of heart attack and stroke . Patient self care activities - Over the next 30 days, patient will: o Continue medication as prescribed o Continue low cholesterol diet and exercise routine  Diabetes Lab Results  Component Value Date/Time   HGBA1C 9.0 (A) 04/10/2020 01:12 PM    HGBA1C 9.0 (A) 10/09/2019 10:01 AM   HGBA1C 7.7 (H) 08/28/2018 10:12 AM   HGBA1C 9.3 (H) 02/24/2018 11:09 AM .  Pharmacist Clinical Goal(s): o Over the next 30 days, patient will work with PharmD and providers to achieve A1c goal <8% . Current regimen:  o Trulicity 4.19 mg once weekly o Humalog mix 50/50 32 units with breakfast . Interventions: o Discussed benefits of Trulicity, including F7T lowering, weight loss, insulin dose reduction, and cardiovascular risk reduction o Discussed potential side effects of Trulicty, including nausea, which can be prevented by eating smaller, more frequent meals . Patient self care activities - Over the next 30 days, patient will: o Check blood sugar twice daily, document, and provide at future appointments o Contact provider with any episodes of hypoglycemia o Contact [employer] about Trulicity approval  Medication management . Pharmacist Clinical Goal(s): o Over the next 30 days, patient will work with PharmD and providers to achieve optimal medication adherence . Current pharmacy: [facility] . Interventions o Comprehensive medication review performed. o Continue current medication management strategy . Patient self care activities - Over the next 30 days, patient will: o Focus on medication adherence by fill date o Take medications as prescribed o Report any questions or concerns to PharmD and/or provider(s)  Initial goal documentation      [name] was given information about Chronic Care Management services today including:  1. CCM service includes personalized support from designated clinical staff supervised by his physician, including individualized  plan of care and coordination with other care providers 2. 24/7 contact phone numbers for assistance for urgent and routine care needs. 3. Standard insurance, coinsurance, copays and deductibles apply for chronic care management only during months in which we provide at least 20 minutes of  these services. Most insurances cover these services at 100%, however patients may be responsible for any copay, coinsurance and/or deductible if applicable. This service may help you avoid the need for more expensive face-to-face services. 4. Only one practitioner may furnish and bill the service in a calendar month. 5. The patient may stop CCM services at any time (effective at the end of the month) by phone call to the office staff.  Patient agreed to services and verbal consent obtained.   The patient verbalized understanding of instructions provided today and agreed to receive a mailed copy of patient instruction and/or educational materials. Telephone follow up appointment with pharmacy team member scheduled for: 1 month  Charlene Brooke, PharmD Clinical Pharmacist [facility] [phone]  Dulaglutide (Trulicity) injection What is this medicine? DULAGLUTIDE (DOO la GLOO tide) is used to improve blood sugar control in adults with type 2 diabetes. This medicine may be used with other oral diabetes medicines. This drug may also reduce the risk of heart attack or stroke if you have type 2 diabetes and risk factors for heart disease. This medicine may be used for other purposes; ask your health care provider or pharmacist if you have questions. COMMON BRAND NAME(S): Trulicity What should I tell my health care provider before I take this medicine? They need to know if you have any of these conditions:  endocrine tumors (MEN 2) or if someone in your family had these tumors  eye disease, vision problems  history of pancreatitis  kidney disease  liver disease  stomach problems  thyroid cancer or if someone in your family had thyroid cancer  an unusual or allergic reaction to dulaglutide, other medicines, foods, dyes, or preservatives  pregnant or trying to get pregnant  breast-feeding How should I use this medicine? This medicine is for injection  under the skin of your upper leg (thigh), stomach area, or upper arm. It is usually given once every week (every 7 days). You will be taught how to prepare and give this medicine. Use exactly as directed. Take your medicine at regular intervals. Do not take it more often than directed. If you use this medicine with insulin, you should inject this medicine and the insulin separately. Do not mix them together. Do not give the injections right next to each other. Change (rotate) injection sites with each injection. It is important that you put your used needles and syringes in a special sharps container. Do not put them in a trash can. If you do not have a sharps container, call your pharmacist or healthcare provider to get one. A special MedGuide will be given to you by the pharmacist with each prescription and refill. Be sure to read this information carefully each time. This drug comes with INSTRUCTIONS FOR USE. Ask your pharmacist for directions on how to use this drug. Read the information carefully. Talk to your pharmacist or health care provider if you have questions. Talk to your pediatrician regarding the use of this medicine in children. Special care may be needed. Overdosage: If you think you have taken too much of this medicine contact a poison control center or emergency room at once. NOTE: This medicine is only for you. Do  not share this medicine with others. What if I miss a dose? If you miss a dose, take it as soon as you can within 3 days after the missed dose. Then take your next dose at your regular weekly time. If it has been longer than 3 days after the missed dose, do not take the missed dose. Take the next dose at your regular time. Do not take double or extra doses. If you have questions about a missed dose, contact your health care provider for advice. What may interact with this medicine?  other medicines for diabetes Many medications may cause changes in blood sugar, these  include:  alcohol containing beverages  antiviral medicines for HIV or AIDS  aspirin and aspirin-like drugs  certain medicines for blood pressure, heart disease, irregular heart beat  chromium  diuretics  male hormones, such as estrogens or progestins, birth control pills  fenofibrate  gemfibrozil  isoniazid  lanreotide  male hormones or anabolic steroids  MAOIs like Carbex, Eldepryl, Marplan, Nardil, and Parnate  medicines for weight loss  medicines for allergies, asthma, cold, or cough  medicines for depression, anxiety, or psychotic disturbances  niacin  nicotine  NSAIDs, medicines for pain and inflammation, like ibuprofen or naproxen  octreotide  pasireotide  pentamidine  phenytoin  probenecid  quinolone antibiotics such as ciprofloxacin, levofloxacin, ofloxacin  some herbal dietary supplements  steroid medicines such as prednisone or cortisone  sulfamethoxazole; trimethoprim  thyroid hormones Some medications can hide the warning symptoms of low blood sugar (hypoglycemia). You may need to monitor your blood sugar more closely if you are taking one of these medications. These include:  beta-blockers, often used for high blood pressure or heart problems (examples include atenolol, metoprolol, propranolol)  clonidine  guanethidine  reserpine This list may not describe all possible interactions. Give your health care provider a list of all the medicines, herbs, non-prescription drugs, or dietary supplements you use. Also tell them if you smoke, drink alcohol, or use illegal drugs. Some items may interact with your medicine. What should I watch for while using this medicine? Visit your doctor or health care professional for regular checks on your progress. Drink plenty of fluids while taking this medicine. Check with your doctor or health care professional if you get an attack of severe diarrhea, nausea, and vomiting. The loss of too much body  fluid can make it dangerous for you to take this medicine. A test called the HbA1C (A1C) will be monitored. This is a simple blood test. It measures your blood sugar control over the last 2 to 3 months. You will receive this test every 3 to 6 months. Learn how to check your blood sugar. Learn the symptoms of low and high blood sugar and how to manage them. Always carry a quick-source of sugar with you in case you have symptoms of low blood sugar. Examples include hard sugar candy or glucose tablets. Make sure others know that you can choke if you eat or drink when you develop serious symptoms of low blood sugar, such as seizures or unconsciousness. They must get medical help at once. Tell your doctor or health care professional if you have high blood sugar. You might need to change the dose of your medicine. If you are sick or exercising more than usual, you might need to change the dose of your medicine. Do not skip meals. Ask your doctor or health care professional if you should avoid alcohol. Many nonprescription cough and cold products contain sugar  or alcohol. These can affect blood sugar. Pens should never be shared. Even if the needle is changed, sharing may result in passing of viruses like hepatitis or HIV. Wear a medical ID bracelet or chain, and carry a card that describes your disease and details of your medicine and dosage times. What side effects may I notice from receiving this medicine? Side effects that you should report to your doctor or health care professional as soon as possible:  allergic reactions like skin rash, itching or hives, swelling of the face, lips, or tongue  breathing problems  changes in vision  diarrhea that continues or is severe  lump or swelling on the neck  severe nausea  signs and symptoms of infection like fever or chills; cough; sore throat; pain or trouble passing urine  signs and symptoms of low blood sugar such as feeling anxious, confusion,  dizziness, increased hunger, unusually weak or tired, sweating, shakiness, cold, irritable, headache, blurred vision, fast heartbeat, loss of consciousness  signs and symptoms of kidney injury like trouble passing urine or change in the amount of urine  trouble swallowing  unusual stomach upset or pain  vomiting Side effects that usually do not require medical attention (report to your doctor or health care professional if they continue or are bothersome):  diarrhea  loss of appetite  nausea  pain, redness, or irritation at site where injected  stomach upset This list may not describe all possible side effects. Call your doctor for medical advice about side effects. You may report side effects to FDA at 1-800-FDA-1088. Where should I keep my medicine? Keep out of the reach of children. Store unopened pens in a refrigerator between 2 and 8 degrees C (36 and 46 degrees F). Do not freeze or use if the medicine has been frozen. Protect from light and excessive heat. Store in the carton until use. Each single-dose pen can be kept at room temperature, not to exceed 30 degrees C (86 degrees F) for a total of 14 days, if needed. Throw away any unused medicine after the expiration date on the label. NOTE: This sheet is a summary. It may not cover all possible information. If you have questions about this medicine, talk to your doctor, pharmacist, or health care provider.  2020 Elsevier/Gold Standard (2019-07-24 09:34:53)

## 2020-04-23 NOTE — Telephone Encounter (Signed)
PATIENT ASSISTANCE PROGRAM - APPROVAL/DENIAL  Received notification from [facility] indicating application for Humalog 27/61 and Trulicity 0.75mg /0.85mL have been approved for a 12 month period. Document has been labeled and placed in scans file for HIM scanning process and for our future reference.

## 2020-05-09 ENCOUNTER — Other Ambulatory Visit: Payer: Self-pay | Admitting: Endocrinology

## 2020-05-12 ENCOUNTER — Telehealth: Payer: Self-pay | Admitting: Internal Medicine

## 2020-05-12 ENCOUNTER — Emergency Department (HOSPITAL_COMMUNITY): Payer: Medicare HMO

## 2020-05-12 ENCOUNTER — Emergency Department (HOSPITAL_COMMUNITY)
Admission: EM | Admit: 2020-05-12 | Discharge: 2020-05-13 | Disposition: A | Discharge status: Home / Self Care | Payer: Medicare HMO | Attending: Emergency Medicine | Admitting: Emergency Medicine

## 2020-05-12 ENCOUNTER — Encounter (HOSPITAL_COMMUNITY): Payer: Self-pay | Admitting: Emergency Medicine

## 2020-05-12 DIAGNOSIS — R05 Cough: Secondary | ICD-10-CM | POA: Insufficient documentation

## 2020-05-12 DIAGNOSIS — R0789 Other chest pain: Secondary | ICD-10-CM | POA: Insufficient documentation

## 2020-05-12 DIAGNOSIS — Z5321 Procedure and treatment not carried out due to patient leaving prior to being seen by health care provider: Secondary | ICD-10-CM | POA: Diagnosis not present

## 2020-05-12 LAB — TROPONIN I (HIGH SENSITIVITY): Troponin I (High Sensitivity): 6 ng/L (ref <18)

## 2020-05-12 LAB — BASIC METABOLIC PANEL
Anion gap: 8 (ref 5–15)
BUN: 24 mg/dL — ABNORMAL HIGH (ref 8–23)
CO2: 26 mmol/L (ref 22–32)
Calcium: 9.2 mg/dL (ref 8.9–10.3)
Chloride: 106 mmol/L (ref 98–111)
Creatinine, Ser: 2.24 mg/dL — ABNORMAL HIGH (ref 0.61–1.24)
GFR calc Af Amer: 33 mL/min — ABNORMAL LOW (ref >60)
GFR calc non Af Amer: 29 mL/min — ABNORMAL LOW (ref >60)
Glucose, Bld: 134 mg/dL — ABNORMAL HIGH (ref 70–99)
Potassium: 4.3 mmol/L (ref 3.5–5.1)
Sodium: 140 mmol/L (ref 135–145)

## 2020-05-12 LAB — CBC
HCT: 34.9 % — ABNORMAL LOW (ref 39.0–52.0)
Hemoglobin: 11.4 g/dL — ABNORMAL LOW (ref 13.0–17.0)
MCH: 31.4 pg (ref 26.0–34.0)
MCHC: 32.7 g/dL (ref 30.0–36.0)
MCV: 96.1 fL (ref 80.0–100.0)
Platelets: 228 10*3/uL (ref 150–400)
RBC: 3.63 MIL/uL — ABNORMAL LOW (ref 4.22–5.81)
RDW: 12.1 % (ref 11.5–15.5)
WBC: 6.8 10*3/uL (ref 4.0–10.5)
nRBC: 0 % (ref 0.0–0.2)

## 2020-05-12 MED ORDER — SODIUM CHLORIDE 0.9% FLUSH: 3.0000 mL | Freq: Once | INTRAVENOUS | Status: DC

## 2020-05-12 NOTE — ED Triage Notes (Signed)
Pt. Stated, [name] had this cough and problem for 3 months. Ive had a cough, chest apin and now I have chest pain and pain on the left side.

## 2020-05-12 NOTE — ED Notes (Signed)
Patient no longer wanted to wait to be seen.

## 2020-05-12 NOTE — Telephone Encounter (Signed)
   Patient calling to report  Heavy, deep coughing  and chest pain Call transferred to [facility]

## 2020-05-14 NOTE — Telephone Encounter (Signed)
Needs visit

## 2020-05-14 NOTE — Telephone Encounter (Signed)
   Patient reports he did go to ED on 6/21. Patient states he left after being triaged, waited over 8 hours.  Patient denies having any chest pain today. He still has a cough, requesting medication be prescribed.

## 2020-05-15 NOTE — Telephone Encounter (Signed)
    Patient scheduled for 6/25 with Valere Dross, video Patient reports coughing until he starts to feel faint. Call transferred to [facility]

## 2020-05-15 NOTE — Telephone Encounter (Signed)
Patient called and spoke with [facility] on 05/15/2020 9:38:19 AM and states he has had a cough for 3 months that is worsening. He coughs so hard that it makes him sweat and feels like he is going to pass out.  Patient was advised to see PCP within 4 hours.  Currently has an appointment scheduled for 05/16/2020- MyChart Visit with Mickel Baas.

## 2020-05-16 ENCOUNTER — Telehealth (INDEPENDENT_AMBULATORY_CARE_PROVIDER_SITE_OTHER): Payer: Medicare HMO | Admitting: Family

## 2020-05-16 DIAGNOSIS — R053 Chronic cough: Secondary | ICD-10-CM

## 2020-05-16 DIAGNOSIS — R05 Cough: Secondary | ICD-10-CM | POA: Diagnosis not present

## 2020-05-16 MED ORDER — PANTOPRAZOLE SODIUM 40 MG PO TBEC
40.0000 mg | DELAYED_RELEASE_TABLET | Freq: Two times a day (BID) | ORAL | 0 refills | Status: DC
Start: 2020-05-16 — End: 2020-07-10

## 2020-05-16 NOTE — Progress Notes (Signed)
[name] is a 69 y.o. male with the following history as recorded in Epic[employer]:  Patient Active Problem List   Diagnosis Date Noted  . Sciatica of left side 07/11/2019  . Cough 07/11/2019  . Edema leg 10/26/2018  . History of stroke 10/26/2018  . DOE (dyspnea on exertion) 10/26/2018  . Left hip pain 07/14/2018  . Acute left ankle pain 07/14/2018  . Chest pain 07/14/2018  . Nonintractable headache 02/24/2018  . CRI (chronic renal insufficiency), stage 3 (moderate) 02/22/2017  . Anemia 02/16/2017  . Chronic combined systolic and diastolic heart failure ([place]) 06/29/2016  . LBBB (left bundle branch block) 02/02/2016  . Nonallopathic lesion of lumbosacral region 12/11/2014  . Nonallopathic lesion of sacral region 12/11/2014  . Nonallopathic lesion of thoracic region 12/11/2014  . Arthritis of left hip 11/20/2014  . Ischial bursitis of left side 10/28/2014  . Hamstring tightness of left lower extremity 09/16/2014  . Piriformis syndrome of left side 09/16/2014  . Dysphonia 06/22/2013  . Hemiparesis affecting left side as late effect of stroke ([place]) 10/10/2012  . Dysphagia following cerebrovascular accident 08/14/2012  . Chronic back pain   . Peripheral neuropathy ([place])   . TIA (transient ischemic attack) 04/08/2012  . Stroke ([place]) 02/21/2012  . [place], RIGHT 05/29/2010  . Diabetes ([place]) 04/13/2010  . ALLERGIC RHINITIS 04/13/2010  . GERD 04/13/2010  . ERECTILE DYSFUNCTION 03/12/2009  . Essential hypertension 03/12/2009  . CAD S/P percutaneous coronary angioplasty 03/12/2009  . Hyperlipidemia 03/07/2009    Current Outpatient Medications  Medication Sig Dispense Refill  . atorvastatin (LIPITOR) 40 MG tablet TAKE 1 TABLET BY MOUTH ONCE DAILY .  MUST  HAVE  OFFICE  VISIT  BEFORE  FURTHER  REFILLS. 90 tablet 2  . Blood Glucose Monitoring Suppl (ONETOUCH VERIO) w/Device KIT USE METER TO CHECK GLUCOSE ONCE DAILY AS DIRECTED    . carvedilol (COREG) 12.5 MG tablet TAKE  1 & 1/2 (ONE & ONE-HALF) TABLETS BY MOUTH TWICE DAILY 270 tablet 3  . cetirizine (ZYRTEC) 10 MG tablet Take 10 mg by mouth as needed.    . chlorthalidone (HYGROTON) 25 MG tablet Take 1 tablet (25 mg total) by mouth daily. 90 tablet 3  . clopidogrel (PLAVIX) 75 MG tablet Take 1 tablet (75 mg total) by mouth daily. Please schedule annual appt in April for refills. Thank you 90 tablet 3  . Dulaglutide (TRULICITY) 1.43 OO/8.7NZ SOPN Inject 0.75 mg into the skin once a week. (Patient not taking: Reported on 04/23/2020) 12 pen 3  . FIBER COMPLETE PO Take 1 Scoop by mouth daily. Whole Food Fiber    . fluticasone (FLONASE) 50 MCG/ACT nasal spray Place 2 sprays into both nostrils daily. 16 g 0  . glucose blood (ONETOUCH VERIO) test strip 1 each by Other route 2 (two) times daily. And lancets 2/day. 200 each 3  . Insulin Lispro Prot & Lispro (HUMALOG MIX 50/50 KWIKPEN) (50-50) 100 UNIT/ML Kwikpen Inject 32 Units into the skin daily with breakfast. 15 mL 11  . losartan (COZAAR) 100 MG tablet Take 1 tablet (100 mg total) by mouth daily. 90 tablet 3  . OneTouch Delica Lancets 97K MISC 1 each by Does not apply route 2 (two) times daily. Use to monitor glucose levels BID; E11.9 100 each 3  . OVER THE COUNTER MEDICATION Take 6 tablets by mouth daily. NeuroRenew    . pantoprazole (PROTONIX) 40 MG tablet Take 1 tablet (40 mg total) by mouth 2 (two) times daily. 60 tablet 0  .  RELION PEN NEEDLES 31G X 6 MM MISC USE 1 PEN NEEDLE ONCE DAILY 50 each 0  . RELION PEN NEEDLES 32G X 4 MM MISC USE 1 ONCE DAILY AS DIRECTED  11  . senna-docusate (SENOKOT-S) 8.6-50 MG per tablet Take 2 tablets by mouth 2 (two) times daily. For constipation.     No current facility-administered medications for this visit.    Allergies: Penicillins, Pneumococcal vaccines, Shellfish allergy, Influenza vaccines, Lisinopril, Topiramate, and Amlodipine  Past Medical History:  Diagnosis Date  . ALLERGIC RHINITIS   . CAD, NATIVE VESSEL    BMS to  OM1 2001, DES to BMS 2005  . Chronic back pain    herniated disc  . Chronic combined systolic and diastolic heart failure ([place]) 06/29/2016  . Constipation    takes Carafate four times day  . DIAB W/UNSPEC COMP TYPE II/UNSPEC TYPE UNCNTRL   . ERECTILE DYSFUNCTION   . GERD   . HYPERLIPIDEMIA-MIXED   . HYPERTENSION, BENIGN   . LBBB (left bundle branch block) 02/02/2016  . MORTON'S NEUROMA, RIGHT   . Peripheral neuropathy   . Seasonal allergies    takes Allegra and Benadryl daily prn;uses Flonase daily  . SHOULDER PAIN, RIGHT   . 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 TIA on medication 02/2012    Past Surgical History:  Procedure Laterality Date  . ANGIOPLASTY    . COLONOSCOPY    . CORONARY ANGIOPLASTY  2005   2 stents  . DENTAL SURGERY    . LARYNGOPLASTY  08/07/2012   Procedure: LARYNGOPLASTY;  Surgeon: Izora Gala, MD;  Location: [facility];  Service: ENT;  Laterality: Left;  Left Vocal Cord Medialyzation  . left knee surgury     x 2   . stent  2001, 2004   coronary stents    Family History  Problem Relation Age of Onset  . Cancer Mother     Social History   Tobacco Use  . Smoking status: Former Smoker    Quit date: 02/24/1983    Years since quitting: 37.2  . Smokeless tobacco: Never Used  Substance Use Topics  . Alcohol use: No    Alcohol/week: 0.0 standard drinks    Subjective:    I connected with [name] on 05/16/20 at 10:40 AM EDT by a video enabled telemedicine application and verified that I am speaking with the correct person using two identifiers.   I discussed the limitations of evaluation and management by telemedicine and the availability of in person appointments. The patient expressed understanding and agreed to proceed. Provider in office/ patient is at home; provider and patient are only 2 people on video call.   Patient did an in office visit with his PCP in mid-May about a cough that had been present for 2 months; He  was treated with Doxycycline and normal CXR was done; referral to pulmonology was discussed if the symptoms persisted;  Went to the ER earlier this week with concerns for persisting cough/ flank pain; had normal EKG/ normal CXR done but opted not to wait to be seen; per patient, he has had this cough for at least 3 months;  He does note that he has a history of GERD and does feel that food can make the cough worse; he is also worried about exposure to asbestos/ mesothelioma and is very interested in seeing the lung specialist;    Objective:  There were no vitals filed for this visit.  General: Well developed, well nourished, in no acute distress  Head: Normocephalic and atraumatic  Lungs: Respirations unlabored;  Neurologic: Alert and oriented; speech intact; face symmetrical;   Assessment:  1. Chronic cough     Plan:  Normal CXR done earlier this week; no response to antibiotics given in May; trial of Protonix 40 mg bid- ? GERD component; refer to pulmonology; he may need to be seen if office if symptoms changes before pulmonology can see him.    No follow-ups on file.  Orders Placed This Encounter  Procedures  . Ambulatory referral to Pulmonology    Referral Priority:   Routine    Referral Type:   Consultation    Referral Reason:   Specialty Services Required    Requested Specialty:   Pulmonary Disease    Number of Visits Requested:   1    Requested Prescriptions   Signed Prescriptions Disp Refills  . pantoprazole (PROTONIX) 40 MG tablet 60 tablet 0    Sig: Take 1 tablet (40 mg total) by mouth 2 (two) times daily.

## 2020-05-19 ENCOUNTER — Ambulatory Visit: Payer: Medicare HMO | Admitting: Internal Medicine

## 2020-05-22 ENCOUNTER — Other Ambulatory Visit: Payer: Self-pay | Admitting: Adult Health

## 2020-05-24 ENCOUNTER — Other Ambulatory Visit: Payer: Self-pay | Admitting: Adult Health

## 2020-05-28 ENCOUNTER — Other Ambulatory Visit: Payer: Self-pay

## 2020-05-28 ENCOUNTER — Ambulatory Visit: Payer: Medicare HMO | Admitting: Pharmacist

## 2020-05-28 DIAGNOSIS — IMO0002 Reserved for concepts with insufficient information to code with codable children: Secondary | ICD-10-CM

## 2020-05-28 DIAGNOSIS — I10 Essential (primary) hypertension: Secondary | ICD-10-CM

## 2020-05-28 DIAGNOSIS — E7849 Other hyperlipidemia: Secondary | ICD-10-CM

## 2020-05-28 DIAGNOSIS — I2510 Atherosclerotic heart disease of native coronary artery without angina pectoris: Secondary | ICD-10-CM

## 2020-05-28 DIAGNOSIS — E1140 Type 2 diabetes mellitus with diabetic neuropathy, unspecified: Secondary | ICD-10-CM

## 2020-05-28 DIAGNOSIS — Z9861 Coronary angioplasty status: Secondary | ICD-10-CM

## 2020-05-28 DIAGNOSIS — I5042 Chronic combined systolic (congestive) and diastolic (congestive) heart failure: Secondary | ICD-10-CM

## 2020-05-28 NOTE — Patient Instructions (Addendum)
Visit Information  Phone number for Pharmacist: [phone]  Goals Addressed            This Visit's Progress   . Pharmacy Care Plan       CARE PLAN ENTRY  Current Barriers:  . Chronic Disease Management support, education, and care coordination needs related to Hypertension, Hyperlipidemia, Diabetes, Heart Failure, Coronary Artery Disease, and Constipation   Hypertension / Heart Failure BP Readings from Last 3 Encounters:  04/15/20 (!) 144/80  04/10/20 (!) 144/70  04/02/20 129/70 .  Pharmacist Clinical Goal(s): o Over the next 90 days, patient will work with PharmD and providers to achieve BP goal <140/90 . Current regimen:  o Carvedilol 12.5 mg BID o Chlorthalidone 25 mg daily o Losartan 100 mg daily . Interventions: o Discussed benefits of medications for controlling BP and heart failure, and prevention of heart attack and stroke . Patient self care activities - Over the next 90 days, patient will: o Check BP daily, document, and provide at future appointments o Ensure daily salt intake < 2300 mg/day  Hyperlipidemia/Coronary artery disease Lab Results  Component Value Date/Time   LDLCALC 40 04/15/2020 11:28 AM   LDLCALC 82 07/20/2017 12:02 PM   LDLDIRECT 63.0 01/12/2016 09:31 AM   Pharmacist Clinical Goal(s): o Over the next 90 days, patient will work with PharmD and providers to maintain LDL goal < 70 . Current regimen:  o Atorvastatin 40 mg daily o Clopidogrel 75 mg daily . Interventions: o Discussed benefits of medications for prevention of heart attack and stroke . Patient self care activities - Over the next 90 days, patient will: o Continue medication as prescribed o Continue low cholesterol diet and exercise routine  Diabetes Lab Results  Component Value Date/Time   HGBA1C 9.0 (A) 04/10/2020 01:12 PM   HGBA1C 9.0 (A) 10/09/2019 10:01 AM   HGBA1C 7.7 (H) 08/28/2018 10:12 AM   HGBA1C 9.3 (H) 02/24/2018 11:09 AM .  Pharmacist Clinical Goal(s): o Over  the next 90 days, patient will work with PharmD and providers to achieve A1c goal <8% . Current regimen:  o Trulicity 1.95 mg once weekly o Humalog mix 50/50 32 units with breakfast . Interventions: o Discussed BG goals and benefits of medications for prevention of diabetic complications . Patient self care activities - Over the next 90 days, patient will: o Check blood sugar twice daily, document, and provide at future appointments o Contact provider with any episodes of hypoglycemia  Constipation . Pharmacist Clinical Goal(s) o Over the next 90 days, patient will work with PharmD and providers to optimize therapy . Current regimen:  o Senna-docusate 2 tablets twice a day o Whole Foods Fiber 2 capsules twice a day o Miralax once daily . Interventions: o Discussed importance of dietary fiber and adequate hydration o Recommended Miralax BID and increase in dietary fiber . Patient self care activities - Over the next 90 days, patient will: o Try Miralax BID and increase dietary fiber o Follow up with PCP if constipation does not improve  Medication management . Pharmacist Clinical Goal(s): o Over the next 90 days, patient will work with PharmD and providers to achieve optimal medication adherence . Current pharmacy: [facility] . Interventions o Comprehensive medication review performed. o Continue current medication management strategy . Patient self care activities - Over the next 90 days, patient will: o Focus on medication adherence by fill date o Take medications as prescribed o Report any questions or concerns to PharmD and/or provider(s)  Please see past  updates related to this goal by clicking on the "Past Updates" button in the selected goal       The patient verbalized understanding of instructions provided today and agreed to receive a mailed copy of patient instruction and/or educational materials.  Telephone follow up appointment with pharmacy team member scheduled  for: 3 months  Charlene Brooke, PharmD Clinical Pharmacist [facility] [phone]   Chronic Constipation  Chronic constipation is a condition in which a person has three or fewer bowel movements a week, for three months or longer. This condition is especially common in older adults. The two main kinds of chronic constipation are secondary constipation and functional constipation. Secondary constipation results from another condition or a treatment. Functional constipation, also called primary or idiopathic constipation, is divided into three types:  Normal transit constipation. In this type, movement of stool through the colon (stool transit) occurs normally.  Slow transit constipation. In this type, stool moves slowly through the colon.  Outlet constipation or pelvic floor dysfunction. In this type, the nerves and muscles that empty the rectum do not work normally. What are the causes? Causes of secondary constipation may include:  Failing to drink enough fluid, eat enough food or fiber, or get physically active.  Pregnancy.  A tear in the anus (anal fissure).  Blockage in the bowel (bowel obstruction).  Narrowing of the bowel (bowel stricture).  Having a long-term medical condition, such as: ? Diabetes. ? Hypothyroidism. ? Multiple sclerosis. ? Parkinson disease. ? Stroke. ? Spinal cord injury. ? Dementia. ? Colon cancer. ? Inflammatory bowel disease (IBD). ? Iron-deficiency anemia. ? Outward collapse of the rectum (rectal prolapse). ? Hemorrhoids.  Taking certain medicines, including: ? Narcotics. These are a certain type of prescription pain medicine. ? Antacids. ? Iron supplements. ? Water pills (diuretics). ? Certain blood pressure medicines. ? Anti-seizure medicines. ? Antidepressants. ? Medicines for Parkinson disease. The cause of functional constipation is not known, but some conditions are associated with it. These  conditions include:  Stress.  Problems in the nerves and muscles that control stool transit.  Weak or impaired pelvic floor muscles. What increases the risk? You may be at higher risk for chronic constipation if you:  Are older than age 31.  Are male.  Live in a long-term care facility.  Do not get much exercise or physical activity (have a sedentary lifestyle).  Do not drink enough fluids.  Do not eat enough food, especially fiber.  Have a long-term disease.  Have a mental health disorder or eating disorder.  Take many medicines. What are the signs or symptoms? The main symptom of chronic constipation is having three or fewer bowel movements a week for several weeks. Other signs and symptoms may vary from person to person. These include:  Pushing hard (straining) to pass stool.  Painful bowel movements.  Having hard or lumpy stools.  Having lower belly discomfort, such as cramps or bloating.  Being unable to have a bowel movement when you feel the urge.  Feeling like you still need to pass stool after a bowel movement.  Feeling that you have something in your rectum that is blocking or preventing bowel movements.  Seeing blood on the toilet paper or in your stool.  Worsening confusion (in older adults). How is this diagnosed? This condition may be diagnosed based on:  Symptoms and medical history. You will be asked about your symptoms, lifestyle, diet, and any medicines that you are taking.  Physical exam. ?  Your belly (abdomen) will be examined. ? A digital rectal exam may be done. For this exam, a health care provider places a lubricated, gloved finger into the rectum.  Other tests to check for any underlying causes of your constipation. These may be ordered if you have bleeding in your rectum, weight loss, or a family history of colon cancer. In these cases, you may have: ? Imaging studies of the colon. These may include X-ray, ultrasound, or CT  scan. ? Blood tests. ? A procedure to examine the inside of your colon (colonoscopy). ? More specialized tests to check:  Whether your anal sphincter works well. This is a ring-shaped muscle that controls the closing of the anus.  How well food moves through your colon. ? Tests to measure the nerve signal in your pelvic floor muscles (electromyography). How is this treated? Treatment for chronic constipation depends on the cause. Most often, treatment starts with:  Being more active and getting regular exercise.  Drinking more fluids.  Adding fiber to your diet. Sources of fiber include fruits, vegetables, whole grains, and fiber supplements.  Using medicines such as stool softeners or medicines that increase contractions in your digestive system (pro-motility agents).  Training your pelvic muscles with biofeedback.  Surgery, if there is obstruction. Treatment for secondary chronic constipation depends on the underlying condition. You may need to:  Stop or change some medicines if they cause constipation.  Use a fiber supplement (bulk laxative) or stool softener.  Use prescription laxative. This works by [employer] into your colon (osmotic laxative). You may also need to see a specialist who treats conditions of the digestive system (gastroenterologist). Follow these instructions at home:   Take over-the-counter and prescription medicines only as told by your health care provider.  If you are taking a laxative, take it as told by your health care provider.  Eat a balanced diet that includes enough fiber. Ask your health care provider to recommend a diet that is right for you.  Drink clear fluids, especially water. Avoid drinking alcohol, caffeine, and soda.  Drink enough fluid to keep your urine pale yellow.  Get some physical activity every day. Ask your health care provider what physical activities are safe for you.  Get colon cancer screenings as told by your  health care provider.  Keep all follow-up visits as told by your health care provider. This is important. Contact a health care provider if:  You are having three or fewer bowel movements a week.  Your stools are hard or lumpy.  You notice blood on the toilet paper or in your stool after you have a bowel movement.  You have unexplained weight loss.  You have rectum (rectal) pain.  You have stool leakage.  You experience nausea or vomiting. Get help right away if:  You have rectal bleeding or you pass blood clots.  You have severe rectal pain.  You have body tissue that pushes out (protrudes) from your anus.  You have severe pain or bloating (distension) in your abdomen.  You have vomiting that you cannot control. Summary  Chronic constipation is a condition in which a person has three or fewer bowel movements a week, for three months or longer.  You may have a higher risk for this condition if you are an older adult, or if you do not drink enough water or get enough physical activity (are sedentary).  Treatment for this condition depends on the cause. Most treatments for chronic constipation include adding fiber  to your diet, drinking more fluids, and getting more physical activity. You may also need to treat any underlying medical conditions or stop or change certain medicines if they cause constipation.  If lifestyle changes do not relieve constipation, your health care provider may recommend taking a laxative. This information is not intended to replace advice given to you by your health care provider. Make sure you discuss any questions you have with your health care provider. Document Revised: 10/21/2017 Document Reviewed: 07/26/2017 Elsevier Patient Education  [place].

## 2020-05-28 NOTE — Chronic Care Management (AMB) (Signed)
Chronic Care Management Pharmacy  Name: [name]  MRN: [id number] DOB: 23-Dec-1950  Chief Complaint/ HPI  [name],  69 y.o. , male presents for their Follow-Up CCM visit with the clinical pharmacist via telephone due to COVID-19 Pandemic.  PCP : Binnie Rail, MD  Patient Care Team: Binnie Rail, MD as PCP - General (Internal Medicine) Skeet Latch, MD as PCP - Cardiology (Cardiology) Nahser, Wonda Cheng, MD (Cardiology) Renato Shin, MD (Endocrinology) Izora Gala, MD (Otolaryngology) Garvin Fila, MD (Neurology) Almedia Balls, MD (Orthopedic Surgery) Sable Feil, MD (Gastroenterology) Wynona Canes, MD as Consulting Physician (Neurology) Charlton Haws, [facility] as Pharmacist (Pharmacist)   Their chronic conditions include: Hypertension, Hyperlipidemia, Diabetes, Heart Failure, Coronary Artery Disease, GERD, Chronic Kidney Disease, Osteoarthritis and Neuropathy  Works out 3 days per week with friend - physical trainer. Weight training, treadmill. Goes to a restaurant few days a week - [place], [place]. Lives with daughter for a year now.   Office Visits: 05/16/20 NP Marvis Repress VV: chronic cough, recent Xray negative; possible GERD component, trial Protonix 40 mg BID and referred to pulmonology.   04/15/20 Dr Quay Burow OV: c/o cough x months. Ordered CXR. Kidney functioning worsening - emphasize hydration and DM/BP control. Gave doxycycline course.  Consult Visit: 05/12/20 ED visit: for cough, chest Xray negative for acute issue, BMP CBC WNL  04/10/20 Dr Loanne Drilling (endocrine): reduce insulin to 32 units with breakfast, start Trulicity. Pt attempting Lilly Cares PAP.   04/02/20 NP Purcell Nails (cardiology): BP control improved, no med changes.   Allergies  Allergen Reactions  . Penicillins Anaphylaxis  . Pneumococcal Vaccines Anaphylaxis  . Shellfish Allergy Anaphylaxis  . Influenza Vaccines   . Lisinopril     cough  . Topiramate Other  (See Comments)    Chest spasms and numbness  . Amlodipine Other (See Comments)    LE edema   Medications: Outpatient Encounter Medications as of 05/28/2020  Medication Sig  . atorvastatin (LIPITOR) 40 MG tablet Take 1 tablet (40 mg total) by mouth daily.  . Blood Glucose Monitoring Suppl (ONETOUCH VERIO) w/Device KIT USE METER TO CHECK GLUCOSE ONCE DAILY AS DIRECTED  . carvedilol (COREG) 12.5 MG tablet TAKE 1 & 1/2 (ONE & ONE-HALF) TABLETS BY MOUTH TWICE DAILY  . cetirizine (ZYRTEC) 10 MG tablet Take 10 mg by mouth as needed.  . chlorthalidone (HYGROTON) 25 MG tablet Take 1 tablet (25 mg total) by mouth daily.  . clopidogrel (PLAVIX) 75 MG tablet Take 1 tablet (75 mg total) by mouth daily. Please schedule annual appt in April for refills. Thank you  . Dulaglutide (TRULICITY) 2.26 JF/3.5KT SOPN Inject 0.75 mg into the skin once a week. (Patient not taking: Reported on 04/23/2020)  . FIBER COMPLETE PO Take 1 Scoop by mouth daily. Whole Food Fiber  . fluticasone (FLONASE) 50 MCG/ACT nasal spray Place 2 sprays into both nostrils daily.  Marland Kitchen glucose blood (ONETOUCH VERIO) test strip 1 each by Other route 2 (two) times daily. And lancets 2/day.  . Insulin Lispro Prot & Lispro (HUMALOG MIX 50/50 KWIKPEN) (50-50) 100 UNIT/ML Kwikpen Inject 32 Units into the skin daily with breakfast.  . losartan (COZAAR) 100 MG tablet Take 1 tablet by mouth once daily  . OneTouch Delica Lancets 62B MISC 1 each by Does not apply route 2 (two) times daily. Use to monitor glucose levels BID; E11.9  . OVER THE COUNTER MEDICATION Take 6 tablets by mouth daily. NeuroRenew  . pantoprazole (PROTONIX) 40 MG tablet Take  1 tablet (40 mg total) by mouth 2 (two) times daily.  Marland Kitchen RELION PEN NEEDLES 31G X 6 MM MISC USE 1 PEN NEEDLE ONCE DAILY  . RELION PEN NEEDLES 32G X 4 MM MISC USE 1 ONCE DAILY AS DIRECTED  . senna-docusate (SENOKOT-S) 8.6-50 MG per tablet Take 2 tablets by mouth 2 (two) times daily. For constipation.   No  facility-administered encounter medications on file as of 05/28/2020.     Current Diagnosis/Assessment:  SDOH Interventions     Most Recent Value  SDOH Interventions  Financial Strain Interventions Intervention Not Indicated  [Trulicity/Humalog PAP approved]     Goals Addressed            This Visit's Progress   . Pharmacy Care Plan       CARE PLAN ENTRY  Current Barriers:  . Chronic Disease Management support, education, and care coordination needs related to Hypertension, Hyperlipidemia, Diabetes, Heart Failure, Coronary Artery Disease, and Constipation   Hypertension / Heart Failure BP Readings from Last 3 Encounters:  04/15/20 (!) 144/80  04/10/20 (!) 144/70  04/02/20 129/70 .  Pharmacist Clinical Goal(s): o Over the next 90 days, patient will work with PharmD and providers to achieve BP goal <140/90 . Current regimen:  o Carvedilol 12.5 mg BID o Chlorthalidone 25 mg daily o Losartan 100 mg daily . Interventions: o Discussed benefits of medications for controlling BP and heart failure, and prevention of heart attack and stroke . Patient self care activities - Over the next 90 days, patient will: o Check BP daily, document, and provide at future appointments o Ensure daily salt intake < 2300 mg/day  Hyperlipidemia/Coronary artery disease Lab Results  Component Value Date/Time   LDLCALC 40 04/15/2020 11:28 AM   LDLCALC 82 07/20/2017 12:02 PM   LDLDIRECT 63.0 01/12/2016 09:31 AM   Pharmacist Clinical Goal(s): o Over the next 90 days, patient will work with PharmD and providers to maintain LDL goal < 70 . Current regimen:  o Atorvastatin 40 mg daily o Clopidogrel 75 mg daily . Interventions: o Discussed benefits of medications for prevention of heart attack and stroke . Patient self care activities - Over the next 90 days, patient will: o Continue medication as prescribed o Continue low cholesterol diet and exercise routine  Diabetes Lab Results  Component  Value Date/Time   HGBA1C 9.0 (A) 04/10/2020 01:12 PM   HGBA1C 9.0 (A) 10/09/2019 10:01 AM   HGBA1C 7.7 (H) 08/28/2018 10:12 AM   HGBA1C 9.3 (H) 02/24/2018 11:09 AM .  Pharmacist Clinical Goal(s): o Over the next 90 days, patient will work with PharmD and providers to achieve A1c goal <8% . Current regimen:  o Trulicity 7.40 mg once weekly o Humalog mix 50/50 32 units with breakfast . Interventions: o Discussed BG goals and benefits of medications for prevention of diabetic complications . Patient self care activities - Over the next 90 days, patient will: o Check blood sugar twice daily, document, and provide at future appointments o Contact provider with any episodes of hypoglycemia  Constipation . Pharmacist Clinical Goal(s) o Over the next 90 days, patient will work with PharmD and providers to optimize therapy . Current regimen:  o Senna-docusate 2 tablets twice a day o Whole Foods Fiber 2 capsules twice a day o Miralax once daily . Interventions: o Discussed importance of dietary fiber and adequate hydration o Recommended Miralax BID and increase in dietary fiber . Patient self care activities - Over the next 90 days, patient will: o  Try Miralax BID and increase dietary fiber o Follow up with PCP if constipation does not improve  Medication management . Pharmacist Clinical Goal(s): o Over the next 90 days, patient will work with PharmD and providers to achieve optimal medication adherence . Current pharmacy: [facility] . Interventions o Comprehensive medication review performed. o Continue current medication management strategy . Patient self care activities - Over the next 90 days, patient will: o Focus on medication adherence by fill date o Take medications as prescribed o Report any questions or concerns to PharmD and/or provider(s)  Please see past updates related to this goal by clicking on the "Past Updates" button in the selected goal         Heart  Failure/Hypertension   HF Type: Combined Systolic and Diastolic Last ejection fraction: 40-45% (09/20/2018)  BP goal is:  <140/90  Recent Office BP readings: BP Readings from Last 3 Encounters:  05/12/20 122/77  04/15/20 (!) 144/80  04/10/20 (!) 144/70   Kidney Function Lab Results  Component Value Date/Time   CREATININE 2.24 (H) 05/12/2020 03:09 PM   CREATININE 1.95 (H) 04/15/2020 11:28 AM   GFR 41.31 (L) 04/15/2020 11:28 AM   GFRNONAA 29 (L) 05/12/2020 03:09 PM   GFRAA 33 (L) 05/12/2020 03:09 PM   Patient checks BP at home daily Patient home BP readings are ranging: 130/80  Patient has failed these meds in past: n/a Patient is currently controlled on the following medications:   Carvedilol 12.5 mg BID   Chlorthalidone 25 mg daily  Losartan 100 mg daily  We discussed diet and exercise extensively; benefits of medications and lifelong duration of treatment; pt exercises 3 days per week with personal trainer.  Plan  Continue current medications and control with diet and exercise   Hyperlipidemia/CAD   CAD - BMS 2001, DES 2005; stroke 2013 LDL goal < 70  Lipid Panel     Component Value Date/Time   CHOL 112 04/15/2020 1128   CHOL 164 07/20/2017 1202   TRIG 136.0 04/15/2020 1128   TRIG 200 04/20/2009 0000   HDL 44.60 04/15/2020 1128   HDL 55 07/20/2017 1202   CHOLHDL 3 04/15/2020 1128   VLDL 27.2 04/15/2020 1128   LDLCALC 40 04/15/2020 1128   LDLCALC 82 07/20/2017 1202   LDLDIRECT 63.0 01/12/2016 0931   LABVLDL 27 07/20/2017 1202    The ASCVD Risk score (Goff DC Jr., et al., 2013) failed to calculate for the following reasons:   The patient has a prior MI or stroke diagnosis   Patient has failed these meds in past: n/a Patient is currently controlled on the following medications:  . Atorvastatin 40 mg daily . Clopidogrel 75 mg daily HS  We discussed:  diet and exercise extensively; benefits of statin and clopidogrel for secondary  prevention  Plan  Continue current medications and control with diet and exercise  Diabetes   A1c goal < 8% without hypoglycemia  Recent Relevant Labs: Lab Results  Component Value Date/Time   HGBA1C 9.0 (A) 04/10/2020 01:12 PM   HGBA1C 9.0 (A) 10/09/2019 10:01 AM   HGBA1C 7.7 (H) 08/28/2018 10:12 AM   HGBA1C 9.3 (H) 02/24/2018 11:09 AM   GFR 41.31 (L) 04/15/2020 11:28 AM   GFR 48.79 (L) 10/17/2019 01:29 PM   MICROALBUR 7.5 (H) 08/17/2016 10:30 AM   MICROALBUR 9.6 (H) 08/07/2015 11:17 AM    Last diabetic Eye exam:  Lab Results  Component Value Date/Time   HMDIABEYEEXA Retinopathy (A) 09/23/2017 12:00 AM  Last diabetic Foot exam:  Lab Results  Component Value Date/Time   HMDIABFOOTEX done 11/30/2019 12:00 AM    Checking BG: 2x per Day  Recent FBG Readings: 130-150  Patient has failed these meds in past: n/a Patient is currently uncontrolled on the following medications: . Trulicity 8.98 mg once weekly  . Humalog mix 50/50 32 units with breakfast  We discussed: Lilly Cares PAP was approved in June and patient has received medications. He started Trulicity about 3 weeks ago and it is going well. Pt has not had any hypoglycemic episodes since starting Trulicity and BG is better controlled.  Plan  Continue current medications and control with diet and exercise  Chronic cough   Patient has failed these meds in past: n/a Patient is currently controlled on the following medications:  . Pantoprazole 40 mg BID  We discussed:  Pt reports cough has improved since starting PPI, he also has pulmonology appt scheduled in August.  Plan  Continue current medications  Constipation   Patient is currently uncontrolled on the following medications:  . Senna-docusate 2 tabs BID . Whole Food Fiber 2 pills BID . Miralax once daily   We discussed: Pt reports constipation has not improved since last visit; he endorses compliance with regimen as above. Drinks 4-5 16-oz  bottles of water per day. He had to use a suppository last week after not having BM for almost a week, he was able to have a BM after that but it still felt forced. Pt reports constipation has been an issue for him since he started eating less when the chronic cough started. Discussed benefits of dietary fiber and recommended to try Miralax BID. If constipation does not improve further workup may be required.  Plan  Continue current medications  Try Miralax BID F/U with PCP if constipation does not improve   Medication Management   Pt uses [facility] for all medications Uses pill box? No - keeps bottles in container separated by day night Pt endorses 99% compliance  We discussed: [facility] is the preferred pharmacy by [facility]. He would like to switch to [facility] but it not a preferred pharmacy with current plan so he will stay with [facility].   Plan  Continue current medication management strategy    Follow up: 3 month phone visit  Charlene Brooke, PharmD Clinical Pharmacist [facility] [phone]

## 2020-05-30 ENCOUNTER — Ambulatory Visit (INDEPENDENT_AMBULATORY_CARE_PROVIDER_SITE_OTHER): Payer: Medicare HMO | Admitting: Podiatry

## 2020-05-30 ENCOUNTER — Other Ambulatory Visit: Payer: Self-pay

## 2020-05-30 ENCOUNTER — Encounter: Payer: Self-pay | Admitting: Podiatry

## 2020-05-30 DIAGNOSIS — B351 Tinea unguium: Secondary | ICD-10-CM

## 2020-05-30 DIAGNOSIS — E1142 Type 2 diabetes mellitus with diabetic polyneuropathy: Secondary | ICD-10-CM

## 2020-05-30 DIAGNOSIS — M79674 Pain in right toe(s): Secondary | ICD-10-CM | POA: Diagnosis not present

## 2020-05-30 DIAGNOSIS — M79675 Pain in left toe(s): Secondary | ICD-10-CM | POA: Diagnosis not present

## 2020-05-30 NOTE — Progress Notes (Signed)
This patient returns to my office for at risk foot care.  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  Patient is taking plavix.  Patient has history of CVA. This patient is unable to cut nails himself since the patient cannot reach his nails.These nails are painful walking and wearing shoes.  This patient presents for at risk foot care today.  General Appearance  Alert, conversant and in no acute stress.  Vascular  Dorsalis pedis and posterior tibial  pulses are palpable  bilaterally.  Capillary return is within normal limits  bilaterally. Temperature is within normal limits  bilaterally.  Neurologic  Senn-Weinstein monofilament wire test within normal limits  bilaterally. Muscle power within normal limits bilaterally.  Nails Thick disfigured discolored nails with subungual debris  from hallux to fifth toes bilaterally. No evidence of bacterial infection or drainage bilaterally.  Orthopedic  No limitations of motion  feet .  No crepitus or effusions noted.  No bony pathology or digital deformities noted.  Skin  normotropic skin with no porokeratosis noted bilaterally.  No signs of infections or ulcers noted.     Onychomycosis  Pain in right toes  Pain in left toes  Consent was obtained for treatment procedures.   Mechanical debridement of nails 1-5  bilaterally performed with a nail nipper.  Filed with dremel without incident.    Return office visit   3 months                   Told patient to return for periodic foot care and evaluation due to potential at risk complications.   Gardiner Barefoot DPM

## 2020-06-13 ENCOUNTER — Other Ambulatory Visit: Payer: Self-pay

## 2020-06-13 ENCOUNTER — Ambulatory Visit (INDEPENDENT_AMBULATORY_CARE_PROVIDER_SITE_OTHER): Payer: Medicare HMO | Admitting: Endocrinology

## 2020-06-13 ENCOUNTER — Encounter: Payer: Self-pay | Admitting: Endocrinology

## 2020-06-13 VITALS — BP 150/80 | HR 83 | Ht 71.0 in | Wt 222.0 lb

## 2020-06-13 DIAGNOSIS — E1165 Type 2 diabetes mellitus with hyperglycemia: Secondary | ICD-10-CM

## 2020-06-13 DIAGNOSIS — Z794 Long term (current) use of insulin: Secondary | ICD-10-CM | POA: Diagnosis not present

## 2020-06-13 DIAGNOSIS — IMO0002 Reserved for concepts with insufficient information to code with codable children: Secondary | ICD-10-CM

## 2020-06-13 DIAGNOSIS — E1140 Type 2 diabetes mellitus with diabetic neuropathy, unspecified: Secondary | ICD-10-CM | POA: Diagnosis not present

## 2020-06-13 DIAGNOSIS — E119 Type 2 diabetes mellitus without complications: Secondary | ICD-10-CM

## 2020-06-13 LAB — POCT GLYCOSYLATED HEMOGLOBIN (HGB A1C): Hemoglobin A1C: 7.5 % — AB (ref 4.0–5.6)

## 2020-06-13 MED ORDER — HUMALOG MIX 50/50 KWIKPEN (50-50) 100 UNIT/ML ~~LOC~~ SUPN: 20.0000 [IU] | PEN_INJECTOR | Freq: Every day | SUBCUTANEOUS | 11 refills | Status: DC

## 2020-06-13 MED ORDER — TRULICITY 1.5 MG/0.5ML ~~LOC~~ SOAJ
1.5000 mg | SUBCUTANEOUS | 3 refills | Status: DC
Start: 2020-06-13 — End: 2020-07-01

## 2020-06-13 NOTE — Patient Instructions (Addendum)
Your blood pressure is high today.  Please see your primary care provider soon, to have it rechecked Please reduce the insulin to 20 units with breakfast, and:  Increase the Trulicity to 2 shots, once per week.   On this type of insulin schedule, you should eat meals on a regular schedule (especially breakfast and lunch).  If a meal is missed or significantly delayed, your blood sugar could go low.   Please come back for a follow-up appointment in 2 months.   check your blood sugar twice a day.  vary the time of day when you check, between before the 3 meals, and at bedtime.  also check if you have symptoms of your blood sugar being too high or too low.  please keep a record of the readings and bring it to your next appointment here (or you can bring the meter itself).  You can write it on any piece of paper.  please call us sooner if your blood sugar goes below 70, or if you have a lot of readings over 200.

## 2020-06-13 NOTE — Progress Notes (Signed)
Subjective:    Patient ID: [name], male    DOB: 1950/12/20, 69 y.o.   MRN: [id number]  HPI Pt returns for f/u of diabetes mellitus:  DM type: Insulin-requiring type 2 Dx'ed: 9485 Complications: PN, CAD, CRI, DR, and TIA.   Therapy: insulin since 4627, and Trulicity.  DKA: never.   Severe hypoglycemia: last episode was early 2020.   Pancreatitis: never.  SDOH: he gets meds from [facility]--pt assistance Other: He says he cannot use syringe and vial, due to tremor; he changed lantus to NPH, then 70/30, then 50/50, due to pattern on cbg's; he declines multiple daily injections   Interval history: He says he never misses the meds.  no cbg record, but states cbg's vary from 150-170.  It is in general lowest fasting.  pt states he feels well in general.  No recent steroids.   Past Medical History:  Diagnosis Date   ALLERGIC RHINITIS    CAD, NATIVE VESSEL    BMS to [place] 2001, DES to BMS 2005   Chronic back pain    herniated disc   Chronic combined systolic and diastolic heart failure ([place]) 06/29/2016   Constipation    takes Carafate four times day   DIAB W/UNSPEC COMP TYPE II/UNSPEC TYPE UNCNTRL    ERECTILE DYSFUNCTION    GERD    HYPERLIPIDEMIA-MIXED    HYPERTENSION, BENIGN    LBBB (left bundle branch block) 02/02/2016   MORTON'S NEUROMA, RIGHT    Peripheral neuropathy    Seasonal allergies    takes Allegra and Benadryl daily prn;uses Flonase daily   SHOULDER PAIN, RIGHT    Stroke, 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, s/p CIR    TIA on medication 02/2012    Past Surgical History:  Procedure Laterality Date   ANGIOPLASTY     COLONOSCOPY     CORONARY ANGIOPLASTY  2005   2 stents   DENTAL SURGERY     LARYNGOPLASTY  08/07/2012   Procedure: LARYNGOPLASTY;  Surgeon: Izora Gala, MD;  Location: [place];  Service: ENT;  Laterality: Left;  Left Vocal Cord Medialyzation   left knee surgury     x 2    stent  2001, 2004   coronary stents     Social History   Socioeconomic History   Marital status: Legally Separated    Spouse name: Not on file   Number of children: 3   Years of education: college   Highest education level: Not on file  Occupational History   Occupation: disabled    Employer: [employer]  Tobacco Use   Smoking status: Former Smoker    Quit date: 02/24/1983    Years since quitting: 37.3   Smokeless tobacco: Never Used  Vaping Use   Vaping Use: Never used  Substance and Sexual Activity   Alcohol use: No    Alcohol/week: 0.0 standard drinks   Drug use: No   Sexual activity: Never  Other Topics Concern   Not on file  Social History Narrative      Social Determinants of Health   Financial Resource Strain: Low Risk    Difficulty of Paying Living Expenses: Not very hard  Food Insecurity:    Worried About Charity fundraiser in the Last Year:    Arboriculturist in the Last Year:   Transportation Needs:    Film/video editor (Medical):    Lack of Transportation (Non-Medical):   Physical Activity: Sufficiently Active   Days of  Exercise per Week: 3 days   Minutes of Exercise per Session: 60 min  Stress:    Feeling of Stress :   Social Connections:    Frequency of Communication with Friends and Family:    Frequency of Social Gatherings with Friends and Family:    Attends Religious Services:    Active Member of Clubs or Organizations:    Attends Music therapist:    Marital Status:   Intimate Partner Violence:    Fear of Current or Ex-Partner:    Emotionally Abused:    Physically Abused:    Sexually Abused:     Current Outpatient Medications on File Prior to Visit  Medication Sig Dispense Refill   atorvastatin (LIPITOR) 40 MG tablet Take 1 tablet (40 mg total) by mouth daily. 90 tablet 2   Blood Glucose Monitoring Suppl (ONETOUCH VERIO) w/Device KIT USE METER TO CHECK GLUCOSE ONCE DAILY AS DIRECTED     carvedilol (COREG) 12.5 MG tablet  TAKE 1 & 1/2 (ONE & ONE-HALF) TABLETS BY MOUTH TWICE DAILY 270 tablet 3   cetirizine (ZYRTEC) 10 MG tablet Take 10 mg by mouth as needed.     chlorthalidone (HYGROTON) 25 MG tablet Take 1 tablet (25 mg total) by mouth daily. 90 tablet 3   clopidogrel (PLAVIX) 75 MG tablet Take 1 tablet (75 mg total) by mouth daily. Please schedule annual appt in April for refills. Thank you 90 tablet 3   FIBER COMPLETE PO Take 1 Scoop by mouth daily. Whole Food Fiber     fluticasone (FLONASE) 50 MCG/ACT nasal spray Place 2 sprays into both nostrils daily. 16 g 0   glucose blood (ONETOUCH VERIO) test strip 1 each by Other route 2 (two) times daily. And lancets 2/day. 200 each 3   losartan (COZAAR) 100 MG tablet Take 1 tablet by mouth once daily 90 tablet 1   OneTouch Delica Lancets 00Q MISC 1 each by Does not apply route 2 (two) times daily. Use to monitor glucose levels BID; E11.9 100 each 3   OVER THE COUNTER MEDICATION Take 6 tablets by mouth daily. NeuroRenew     pantoprazole (PROTONIX) 40 MG tablet Take 1 tablet (40 mg total) by mouth 2 (two) times daily. 60 tablet 0   RELION PEN NEEDLES 31G X 6 MM MISC USE 1 PEN NEEDLE ONCE DAILY 50 each 0   RELION PEN NEEDLES 32G X 4 MM MISC USE 1 ONCE DAILY AS DIRECTED  11   senna-docusate (SENOKOT-S) 8.6-50 MG per tablet Take 2 tablets by mouth 2 (two) times daily. For constipation.     No current facility-administered medications on file prior to visit.    Allergies  Allergen Reactions   Penicillins Anaphylaxis   Pneumococcal Vaccines Anaphylaxis   Shellfish Allergy Anaphylaxis   Influenza Vaccines    Lisinopril     cough   Topiramate Other (See Comments)    Chest spasms and numbness   Amlodipine Other (See Comments)    LE edema    Family History  Problem Relation Age of Onset   Cancer Mother     BP (!) 150/80    Pulse 83    Ht '5\' 11"'$  (1.803 m)    Wt (!) 222 lb (100.7 kg)    SpO2 98%    BMI 30.96 kg/m    Review of Systems He  denies hypoglycemia and nausea   Objective:   Physical Exam VITAL SIGNS:  See vs page.   GENERAL: no distress Pulses:  dorsalis pedis intact bilat.   MSK: no deformity of the feet CV: trace bilat leg edema Skin:  no ulcer on the feet.  normal color and temp on the feet. Neuro: sensation is intact to touch on the feet, but decreased from normal  Lab Results  Component Value Date   HGBA1C 7.5 (A) 06/13/2020       Assessment & Plan:  Insulin-requiring type 2 DM, with CRI: he would benefit from increased rx, if it can be done with a regimen that avoids or minimizes hypoglycemia. Obesity: increasing Trulicity might help. HTN: is noted today   Patient Instructions  Your blood pressure is high today.  Please see your primary care provider soon, to have it rechecked Please reduce the insulin to 20 units with breakfast, and:  Increase the Trulicity to 2 shots, once per week.   On this type of insulin schedule, you should eat meals on a regular schedule (especially breakfast and lunch).  If a meal is missed or significantly delayed, your blood sugar could go low.   Please come back for a follow-up appointment in 2 months.   check your blood sugar twice a day.  vary the time of day when you check, between before the 3 meals, and at bedtime.  also check if you have symptoms of your blood sugar being too high or too low.  please keep a record of the readings and bring it to your next appointment here (or you can bring the meter itself).  You can write it on any piece of paper.  please call us sooner if your blood sugar goes below 70, or if you have a lot of readings over 200.

## 2020-06-16 ENCOUNTER — Telehealth: Payer: Self-pay

## 2020-06-16 NOTE — Telephone Encounter (Signed)
In my absence, [facility] completed by covering staff member for Trulicity. Indicated on form "DOSAGE CHANGE" but failed to fax form to [place]. Form was faxed to [facility] today with confirmation received.

## 2020-07-01 ENCOUNTER — Encounter: Payer: Self-pay | Admitting: Endocrinology

## 2020-07-01 ENCOUNTER — Other Ambulatory Visit: Payer: Self-pay | Admitting: Endocrinology

## 2020-07-01 MED ORDER — TRULICITY 0.75 MG/0.5ML ~~LOC~~ SOAJ: 0.7500 mg | SUBCUTANEOUS | 3 refills | Status: DC

## 2020-07-02 ENCOUNTER — Telehealth: Payer: Self-pay

## 2020-07-02 ENCOUNTER — Other Ambulatory Visit: Payer: Self-pay

## 2020-07-02 DIAGNOSIS — IMO0002 Reserved for concepts with insufficient information to code with codable children: Secondary | ICD-10-CM

## 2020-07-02 DIAGNOSIS — E1140 Type 2 diabetes mellitus with diabetic neuropathy, unspecified: Secondary | ICD-10-CM

## 2020-07-02 MED ORDER — TRULICITY 0.75 MG/0.5ML ~~LOC~~ SOAJ: 0.7500 mg | SUBCUTANEOUS | 3 refills | Status: DC

## 2020-07-02 NOTE — Telephone Encounter (Signed)
FAXED [place]: Updated Rx (reflecting reduced dosage) Trulicity Other records requested: None  All above requested information has been faxed successfully to [employer] listed above. Documents and fax confirmation have been placed in the faxed file for future reference.

## 2020-07-07 ENCOUNTER — Emergency Department (HOSPITAL_COMMUNITY): Payer: Medicare HMO

## 2020-07-07 ENCOUNTER — Emergency Department (HOSPITAL_COMMUNITY)
Admission: EM | Admit: 2020-07-07 | Discharge: 2020-07-08 | Disposition: A | Discharge status: Home / Self Care | Payer: Medicare HMO | Attending: Emergency Medicine | Admitting: Emergency Medicine

## 2020-07-07 ENCOUNTER — Encounter (HOSPITAL_COMMUNITY): Payer: Self-pay | Admitting: Emergency Medicine

## 2020-07-07 ENCOUNTER — Other Ambulatory Visit: Payer: Self-pay

## 2020-07-07 DIAGNOSIS — R197 Diarrhea, unspecified: Secondary | ICD-10-CM | POA: Insufficient documentation

## 2020-07-07 DIAGNOSIS — M549 Dorsalgia, unspecified: Secondary | ICD-10-CM | POA: Diagnosis not present

## 2020-07-07 DIAGNOSIS — R079 Chest pain, unspecified: Secondary | ICD-10-CM | POA: Insufficient documentation

## 2020-07-07 DIAGNOSIS — Z5321 Procedure and treatment not carried out due to patient leaving prior to being seen by health care provider: Secondary | ICD-10-CM | POA: Diagnosis not present

## 2020-07-07 DIAGNOSIS — R109 Unspecified abdominal pain: Secondary | ICD-10-CM | POA: Diagnosis not present

## 2020-07-07 LAB — URINALYSIS, ROUTINE W REFLEX MICROSCOPIC
Bilirubin Urine: NEGATIVE
Glucose, UA: NEGATIVE mg/dL
Hgb urine dipstick: NEGATIVE
Ketones, ur: NEGATIVE mg/dL
Leukocytes,Ua: NEGATIVE
Nitrite: NEGATIVE
Protein, ur: NEGATIVE mg/dL
Specific Gravity, Urine: 1.017 (ref 1.005–1.030)
pH: 6 (ref 5.0–8.0)

## 2020-07-07 LAB — COMPREHENSIVE METABOLIC PANEL
ALT: 28 U/L (ref 0–44)
AST: 25 U/L (ref 15–41)
Albumin: 4.3 g/dL (ref 3.5–5.0)
Alkaline Phosphatase: 58 U/L (ref 38–126)
Anion gap: 8 (ref 5–15)
BUN: 25 mg/dL — ABNORMAL HIGH (ref 8–23)
CO2: 27 mmol/L (ref 22–32)
Calcium: 9 mg/dL (ref 8.9–10.3)
Chloride: 104 mmol/L (ref 98–111)
Creatinine, Ser: 2.06 mg/dL — ABNORMAL HIGH (ref 0.61–1.24)
GFR calc Af Amer: 37 mL/min — ABNORMAL LOW (ref >60)
GFR calc non Af Amer: 32 mL/min — ABNORMAL LOW (ref >60)
Glucose, Bld: 187 mg/dL — ABNORMAL HIGH (ref 70–99)
Potassium: 3.4 mmol/L — ABNORMAL LOW (ref 3.5–5.1)
Sodium: 139 mmol/L (ref 135–145)
Total Bilirubin: 1 mg/dL (ref 0.3–1.2)
Total Protein: 8.2 g/dL — ABNORMAL HIGH (ref 6.5–8.1)

## 2020-07-07 LAB — CBC
HCT: 34.5 % — ABNORMAL LOW (ref 39.0–52.0)
Hemoglobin: 11.3 g/dL — ABNORMAL LOW (ref 13.0–17.0)
MCH: 31.2 pg (ref 26.0–34.0)
MCHC: 32.8 g/dL (ref 30.0–36.0)
MCV: 95.3 fL (ref 80.0–100.0)
Platelets: 218 10*3/uL (ref 150–400)
RBC: 3.62 MIL/uL — ABNORMAL LOW (ref 4.22–5.81)
RDW: 12 % (ref 11.5–15.5)
WBC: 6.8 10*3/uL (ref 4.0–10.5)
nRBC: 0 % (ref 0.0–0.2)

## 2020-07-07 LAB — TROPONIN I (HIGH SENSITIVITY): Troponin I (High Sensitivity): 32 ng/L — ABNORMAL HIGH (ref <18)

## 2020-07-07 LAB — LIPASE, BLOOD: Lipase: 40 U/L (ref 11–51)

## 2020-07-07 NOTE — ED Notes (Signed)
Patient has a blue top in the main lab °

## 2020-07-07 NOTE — ED Triage Notes (Signed)
Pt reports Trulicity about 2 months ago. That's when the chest pains, abd pains, back pains, gas started intermittently that is worse after eating and drinking. Made his PCP aware. Reports had diarrhea over the past couple days.

## 2020-07-08 NOTE — ED Notes (Signed)
Pt eloped prior to room placement. Called 3X.

## 2020-07-09 ENCOUNTER — Other Ambulatory Visit (HOSPITAL_COMMUNITY)
Admission: RE | Admit: 2020-07-09 | Discharge: 2020-07-09 | Disposition: A | Discharge status: Home / Self Care | Payer: Medicare HMO | Source: Ambulatory Visit | Attending: Cardiovascular Disease | Admitting: Cardiovascular Disease

## 2020-07-09 ENCOUNTER — Other Ambulatory Visit: Payer: Self-pay

## 2020-07-09 ENCOUNTER — Encounter: Payer: Self-pay | Admitting: Cardiovascular Disease

## 2020-07-09 ENCOUNTER — Telehealth: Payer: Self-pay | Admitting: Cardiovascular Disease

## 2020-07-09 ENCOUNTER — Other Ambulatory Visit (HOSPITAL_COMMUNITY): Payer: Medicare HMO

## 2020-07-09 ENCOUNTER — Ambulatory Visit (INDEPENDENT_AMBULATORY_CARE_PROVIDER_SITE_OTHER): Payer: Medicare HMO | Admitting: Cardiovascular Disease

## 2020-07-09 VITALS — BP 122/66 | HR 91 | Temp 97.2°F | Ht 71.0 in | Wt 215.8 lb

## 2020-07-09 DIAGNOSIS — I5042 Chronic combined systolic (congestive) and diastolic (congestive) heart failure: Secondary | ICD-10-CM | POA: Diagnosis not present

## 2020-07-09 DIAGNOSIS — Z20822 Contact with and (suspected) exposure to covid-19: Secondary | ICD-10-CM | POA: Insufficient documentation

## 2020-07-09 DIAGNOSIS — R079 Chest pain, unspecified: Secondary | ICD-10-CM | POA: Diagnosis not present

## 2020-07-09 DIAGNOSIS — Z9861 Coronary angioplasty status: Secondary | ICD-10-CM

## 2020-07-09 DIAGNOSIS — I2510 Atherosclerotic heart disease of native coronary artery without angina pectoris: Secondary | ICD-10-CM | POA: Diagnosis not present

## 2020-07-09 DIAGNOSIS — Z01812 Encounter for preprocedural laboratory examination: Secondary | ICD-10-CM | POA: Insufficient documentation

## 2020-07-09 DIAGNOSIS — I200 Unstable angina: Secondary | ICD-10-CM

## 2020-07-09 DIAGNOSIS — I447 Left bundle-branch block, unspecified: Secondary | ICD-10-CM

## 2020-07-09 DIAGNOSIS — I10 Essential (primary) hypertension: Secondary | ICD-10-CM

## 2020-07-09 HISTORY — DX: Unstable angina: I20.0

## 2020-07-09 LAB — TROPONIN T: Troponin T (Highly Sensitive): 124 ng/L (ref 0–22)

## 2020-07-09 LAB — SARS CORONAVIRUS 2 (TAT 6-24 HRS): SARS Coronavirus 2: NEGATIVE

## 2020-07-09 MED ORDER — PREDNISONE 50 MG PO TABS
ORAL_TABLET | ORAL | 0 refills | Status: DC
Start: 2020-07-09 — End: 2020-07-10

## 2020-07-09 NOTE — Addendum Note (Signed)
Addended by: Alvina Filbert B on: 07/09/2020 10:54 AM   Modules accepted: Orders

## 2020-07-09 NOTE — H&P (View-Only) (Signed)
Cardiology Office Note   Date:  07/09/2020   ID:  [name], DOB [birth year], MRN [id number]  PCP:  Mike Rail, MD  Cardiologist:   Mike Latch, MD   Chief Complaint  Patient presents with  . Chest Pain  . Shortness of Breath  . Cough    ocassional cough     History of Present Illness: [name] is a 69 y.o. male with hypertension, CAD status post PCI, stroke, diabetes mellitus type 2, hyperlipidemia, and prior tobacco abuse who presents for follow up.  [name] was referred by Dr. Billey Walker on 02/09/16. [name] and PCI with a BMS to OM while living in [place].  He had in-stent restenosis in 2005 and had a DES placed.   At that time he reported occasional episodes of chest pain relieved by aspirin.  He denied exertional chest pain but did note dizziness and nausea.  Additionally, he was noted to have a left bundle branch block that was not present on his last EKG in 2013. Therefore, he was referred for exercise Myoview 02/13/16 that revealed LVEF 39% with global hypokinesis but no ischemia.  Echo 03/01/16 revealed LV 40-45% with mild LVH and grade 2 diastolic dysfunction. There was also mild hypokinesis of the mid to apical anteroseptum.   He works with our pharmacists and has achieved very good blood pressure control.  He is in a program at the [place] that checks his BP daily.    [name] had 2 falls in the winter time that he attributed to losing his balance. There was no preceding chest pain or palpitations.  Since his last appointment he has been feeling well.  For the last 1.5 months he is noted episodes of feeling sweaty and lightheaded.  He tends to notice this when doing errands.  He has been struggling with low blood sugars.  During these episodes his glucose has been 70 or lower.  It improves with having something to eat.  He has not had any chest pain or pressure lately and his breathing has been stable.  He has chronic lower extremity edema to the ankles  that seems to get better in the mornings but recurs during the day.  He has no orthopnea or PND.  He likes to ride the recumbent bike or walk but has not been exercising much lately.  He struggles with lack of time as he takes care of his grandchildren during the day.  He also sometimes gets pain and tightness in his calves.  It sometimes occurs when sitting or standing but he also has it when walking.  It is been ongoing for years and seems to be getting a little worse lately.  He was seen in the ED two days ago with a complaint of 2 months of chest pain, abdominal pain, back pain, and gas.  High-sensitivity troponin was elevated at 32.  EKG showed sinus rhythm with a left bundle branch block, which is chronic.  He left prior to receiving a follow-up lab.  He was not evaluated by a provider at that time.  Of note, he started Trulicity around the same time that the symptoms began.  He describes it as chest pain in the center of his chest that radiates to the left and right.  He sometimes gets a pinching sensation on the left.  He notes it after eating.  He also gets short of breath when it occurs.  The symptoms are worse the day after he  takes the Trulicity.  It got worse after increasing it to 1.5 mg from 0.49m.  He reduced it back to 0.734m He has some discomfort with walking and gets diaphoretic when it occurs.  He has chronic LE edema that is unchanged.  He reports orthopnea.  When he lays down he feels like his rib cage and chest are burning.     Past Medical History:  Diagnosis Date  . ALLERGIC RHINITIS   . CAD, NATIVE VESSEL    BMS to OM1 2001, DES to BMS 2005  . Chronic back pain    herniated disc  . Chronic combined systolic and diastolic heart failure ([facility]/06/2016  . Constipation    takes Carafate four times day  . DIAB W/UNSPEC COMP TYPE II/UNSPEC TYPE UNCNTRL   . ERECTILE DYSFUNCTION   . GERD   . HYPERLIPIDEMIA-MIXED   . HYPERTENSION, BENIGN   . LBBB (left bundle branch block)  02/02/2016  . MORTON'S NEUROMA, RIGHT   . Peripheral neuropathy   . Seasonal allergies    takes Allegra and Benadryl daily prn;uses Flonase daily  . SHOULDER PAIN, RIGHT   . 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 TIA on medication 02/2012  . Unstable [place]ina ([facility]/2021    Past Surgical History:  Procedure Laterality Date  . ANGIOPLASTY    . COLONOSCOPY    . CORONARY ANGIOPLASTY  2005   2 stents  . DENTAL SURGERY    . LARYNGOPLASTY  08/07/2012   Procedure: LARYNGOPLASTY;  Surgeon: Mike GalaMD;  [facility]NT;  Laterality: Left;  Left Vocal Cord Medialyzation  . left knee surgury     x 2   . stent  2001, 2004   coronary stents     Current Outpatient Medications  Medication Sig Dispense Refill  . atorvastatin (LIPITOR) 40 MG tablet Take 1 tablet (40 mg total) by mouth daily. 90 tablet 2  . Blood Glucose Monitoring Suppl (ONETOUCH VERIO) w/Device KIT USE METER TO CHECK GLUCOSE ONCE DAILY AS DIRECTED    . carvedilol (COREG) 12.5 MG tablet TAKE 1 & 1/2 (ONE & ONE-HALF) TABLETS BY MOUTH TWICE DAILY 270 tablet 3  . cetirizine (ZYRTEC) 10 MG tablet Take 10 mg by mouth as needed.    . chlorthalidone (HYGROTON) 25 MG tablet Take 1 tablet (25 mg total) by mouth daily. 90 tablet 3  . clopidogrel (PLAVIX) 75 MG tablet Take 1 tablet (75 mg total) by mouth daily. Please schedule annual appt in April for refills. Thank you 90 tablet 3  . Dulaglutide (TRULICITY) 0.1.77GLT/9.0ZEOPN Inject 0.5 mLs (0.75 mg total) into the skin once a week. 6 mL 3  . FIBER COMPLETE PO Take 1 Scoop by mouth daily. Whole Food Fiber    . fluticasone (FLONASE) 50 MCG/ACT nasal spray Place 2 sprays into both nostrils daily. 16 g 0  . glucose blood (ONETOUCH VERIO) test strip 1 each by Other route 2 (two) times daily. And lancets 2/day. 200 each 3  . Insulin Lispro Prot & Lispro (HUMALOG MIX 50/50 KWIKPEN) (50-50) 100 UNIT/ML Kwikpen Inject 20 Units into the skin daily  with breakfast. 15 mL 11  . losartan (COZAAR) 100 MG tablet Take 1 tablet by mouth once daily 90 tablet 1  . OneTouch Delica Lancets 3009QISC 1 each by Does not apply route 2 (two) times daily. Use to monitor glucose levels BID; E11.9 100 each 3  .  OVER THE COUNTER MEDICATION Take 6 tablets by mouth daily. NeuroRenew    . pantoprazole (PROTONIX) 40 MG tablet Take 1 tablet (40 mg total) by mouth 2 (two) times daily. 60 tablet 0  . RELION PEN NEEDLES 31G X 6 MM MISC USE 1 PEN NEEDLE ONCE DAILY 50 each 0  . RELION PEN NEEDLES 32G X 4 MM MISC USE 1 ONCE DAILY AS DIRECTED  11  . senna-docusate (SENOKOT-S) 8.6-50 MG per tablet Take 2 tablets by mouth 2 (two) times daily. For constipation.    . predniSONE (DELTASONE) 50 MG tablet TAKE 1 TABLET BY MOUTH 13 HOURS PRIOR AND 7 HOURS PRIOR TO PROCEDURE 2 tablet 0   No current facility-administered medications for this visit.    Allergies:   Penicillins, Pneumococcal vaccines, Shellfish allergy, Influenza vaccines, Lisinopril, Topiramate, and Amlodipine    Social History:  The patient  reports that he quit smoking about 37 years ago. He has never used smokeless tobacco. He reports that he does not drink alcohol and does not use drugs.   Family History:  The patient's family history includes Cancer in his mother.    ROS:  Please see the history of present illness.   Otherwise, review of systems are positive for L side weakness since his stroke and cough.  All other systems are reviewed and negative.    PHYSICAL EXAM: VS:  BP 122/66   Pulse 91   Temp (!) 97.2 F (36.2 C)   Ht 5' 11"  (1.803 m)   Wt 215 lb 12.8 oz (97.9 kg)   SpO2 99%   BMI 30.10 kg/m  , BMI Body mass index is 30.1 kg/m. GENERAL:  Well appearing HEENT: Pupils equal round and reactive, fundi not visualized, oral mucosa unremarkable NECK:  No jugular venous distention, waveform within normal limits, carotid upstroke brisk and symmetric, no bruits, no thyromegaly LYMPHATICS:  No  cervical adenopathy LUNGS:  Clear to auscultation bilaterally HEART:  RRR.  PMI not displaced or sustained,S1 and S2 within normal limits, no S3, no S4, no clicks, no rubs, no murmurs ABD:  Flat, positive bowel sounds normal in frequency in pitch, no bruits, no rebound, no guarding, no midline pulsatile mass, no hepatomegaly, no splenomegaly EXT:  2 plus pulses throughout, no edema, no cyanosis no clubbing SKIN:  No rashes no nodules NEURO:  Cranial nerves II through XII grossly intact, motor grossly intact throughout PSYCH:  Cognitively intact, oriented to person place and time  EKG:  EKG is ordered today. The ekg ordered 3//13/17 demonstrates sinus rhythm rate 64 bpm.  LBBB.   07/20/17: Sinus rhythm. Rate 74 bpm. Left bundle branch block. 09/01/18; Sinus rhythm.  Rate 71 bpm.  LBBB 07/09/20: Sinus rhythm.  Rate 91 bpm.  LBBB.  Echo 09/20/18: Study Conclusions   - Left ventricle: abnormal septal motion. The cavity size was  mildly dilated. Wall thickness was increased in a pattern of  severe LVH. Systolic function was mildly to moderately reduced.  The estimated ejection fraction was in the range of 40% to 45%.  Doppler parameters are consistent with abnormal left ventricular  relaxation (grade 1 diastolic dysfunction).  - Atrial septum: There was increased thickness of the septum,  consistent with lipomatous hypertrophy. No defect or patent  foramen ovale was identified.   Carotid Doppler 10/24/14: Normal study  Lexiscan Myoview 10/2018:  Nuclear stress EF: 43%. The left ventricular ejection fraction is moderately decreased (30-44%).  Defect 1: There is a medium defect of moderate severity present  in the mid anteroseptal, mid inferoseptal, apical septal and apex location. This septal defect is likely due to the LBBB.  This is an intermediate risk study. This study is similar to the previous study in 2017.  ABI 08/2018: Normal bilaterally  Recent Labs: 10/17/2019:  TSH 2.08 07/07/2020: ALT 28; BUN 25; Creatinine, Ser 2.06; Hemoglobin 11.3; Platelets 218; Potassium 3.4; Sodium 139    Lipid Panel    Component Value Date/Time   CHOL 112 04/15/2020 1128   CHOL 164 07/20/2017 1202   TRIG 136.0 04/15/2020 1128   TRIG 200 04/20/2009 0000   HDL 44.60 04/15/2020 1128   HDL 55 07/20/2017 1202   CHOLHDL 3 04/15/2020 1128   VLDL 27.2 04/15/2020 1128   LDLCALC 40 04/15/2020 1128   LDLCALC 82 07/20/2017 1202   LDLDIRECT 63.0 01/12/2016 0931      Wt Readings from Last 3 Encounters:  07/09/20 215 lb 12.8 oz (97.9 kg)  06/13/20 (!) 222 lb (100.7 kg)  05/12/20 218 lb (98.9 kg)      ASSESSMENT AND PLAN:  # CAD s/p PCI x2: # Unstable angina:  Prior PCI of OM with in-stent restenosis and repeat PCI.  He now has chest pain both after eating and with exertion.  He notes that the timing started after his initiation of Trulicity.  However given his cardiac history and the fact that he had positive cardiac enzymes in the ED, I think we need to have an ischemia evaluation.  We will repeat his high-sensitivity troponin now.  We will get him Covid tested and plan for cath tomorrow.  Continue clopidogrel, atorvastatin, and carvedilol.  We will have him hold his losartan and chlorthalidone in preparation for cath.  He will need precath hydration.  Increase carvedilol to 25 mg twice daily.  # Chronic systolic and diastolic heart failure: Mr. Duddy is euvolemic.  It seems unlikely that his elevated cardiac enzymes are attributable to acute on chronic heart failure.  # Hypertension: BP is well-controlled.  However his renal function is worsening.  We will have him hold his chlorthalidone and losartan and plans for cath as above.  Given his chronic kidney disease we will need to think about his blood pressure medications.  We will make sure he gets a basic metabolic panel 1 week after cath.   # Hyperlipidemia: LDL 40 on 03/2020.  Continue atorvastatin.   # Claudication:  PT/PT pulses are diminished. We will get ABIs.     # CKD IV:  Holding chlorthalidone and losartan prior to cath.  Referral to Nephrology.   Current medicines are reviewed at length with the patient today.  The patient does not have concerns regarding medicines.  The following changes have been made: No changes  Labs/ tests ordered today include:    Orders Placed This Encounter  Procedures  . Troponin I  . EKG 12-Lead     Disposition:   FU with Layce Sprung C. Oval Linsey, MD, [facility] in 1 month     Signed, Johneric Mcfadden C. Oval Linsey, MD, [facility]  07/09/2020 10:33 AM    [place]

## 2020-07-09 NOTE — Telephone Encounter (Signed)
    Pt c/o medication issue:  1. Name of Medication: clopidogrel (PLAVIX) 75 MG tablet  2. How are you currently taking this medication (dosage and times per day)?   3. Are you having a reaction (difficulty breathing--STAT)?   4. What is your medication issue? Pt have question about this meds. He said he normally  Take this in the evening.

## 2020-07-09 NOTE — Patient Instructions (Addendum)
Medication Instructions:  HOLD LOSARTAN AND CHLORTHALIDONE RESUME Saturday IF YOU DO NOT GET OTHER INSTRUCTIONS FROM HOSPITAL   INCREASE CARVEDILOL TO 25 MG TWICE A DAY UNTIL YOU RESUME LOSARTAN AND CHLORTHALIDONE   TAKE Prednisone 50mg  by mouth 13 hours prior to cath and 7 hours prior to cath  [place] CATH  *If you need a refill on your cardiac medications before your next appointment, please call your pharmacy*  Lab Work: TROPONIN TODAY   COVID TEST TODAY  [name]   If you have labs (blood work) drawn today and your tests are completely normal, you will receive your results only by: Marland Kitchen MyChart Message (if you have MyChart) OR . A paper copy in the mail If you have any lab test that is abnormal or we need to change your treatment, we will call you to review the results.  Testing/Procedures: Your physician has requested that you have a cardiac catheterization. Cardiac catheterization is used to diagnose and/or treat various heart conditions. Doctors may recommend this procedure for a number of different reasons. The most common reason is to evaluate chest pain. Chest pain can be a symptom of coronary artery disease (CAD), and cardiac catheterization can show whether plaque is narrowing or blocking your heart's arteries. This procedure is also used to evaluate the valves, as well as measure the blood flow and oxygen levels in different parts of your heart. For further information please visit [email or web address]. Please follow instruction sheet, as given.  Follow-Up: At [facility], you and your health needs are our priority.  As part of our continuing mission to provide you with exceptional heart care, we have created designated Provider Care Teams.  These Care Teams include your primary Cardiologist (physician) and Advanced Practice Providers (APPs -  Physician Assistants and Nurse Practitioners) who all work  together to provide you with the care you need, when you need it.  We recommend signing up for the patient portal called "MyChart".  Sign up information is provided on this After Visit Summary.  MyChart is used to connect with patients for Virtual Visits (Telemedicine).  Patients are able to view lab/test results, encounter notes, upcoming appointments, etc.  Non-urgent messages can be sent to your provider as well.   To learn more about what you can do with MyChart, go to [email or web address].    Your next appointment:   8/25/202 AT 1:20 WITH DR [facility]   You have been referred to NEPHROLOGY   Other Instructions     [address] [address] Dept: [phone] Loc: [place]  07/09/2020  You are scheduled for a Cardiac Catheterization on Thursday, August 19 with Dr. Peter Martinique.  1. Please arrive at the [facility] (Main Entrance A) at [facility]: [address] [place], [place] [address] at 6:00 AM . Free valet parking service is available.   Special note: Every effort is made to have your procedure done on time. Please understand that emergencies sometimes delay scheduled procedures.  2. Diet: Do not eat solid foods after midnight.  The patient may have clear liquids until 5am upon the day of the procedure.  3. Labs: COVID TEST TODAY   4. Medication instructions in preparation for your procedure:   Contrast Allergy: Yes, Please take Prednisone 50mg  by mouth 13 hours prior to cath and  7 hours prior to cath  THEY WILL GIVE YOU YOUR LAST DOSE THE HOSPITAL IN THE MORNING PRIOR TO YOUR CATH  HOLD YOUR INSULIN IN THE MORNING  HOLD LOSARTAN AND CHLORTHALIDONE RESUME Saturday IF YOU DO NOT GET OTHER INSTRUCTIONS FROM HOSPITAL   INCREASE CARVEDILOL TO 25 MG TWICE A DAY UNTIL YOU RESUME LOSARTAN AND CHLORTHALIDONE   On the morning of  your procedure, take your Plavix/Clopidogrel and any morning medicines NOT listed above.  You may use sips of water.  5. Plan for one night stay--bring personal belongings. 6. Bring a current list of your medications and current insurance cards. 7. You MUST have a responsible person to drive you home. 8. Someone MUST be with you the first 24 hours after you arrive home or your discharge will be delayed. 9. Please wear clothes that are easy to get on and off and wear slip-on shoes.  Thank you for allowing [place] to care for you!   -- [facility]

## 2020-07-09 NOTE — Progress Notes (Signed)
Cardiology Office Note   Date:  07/09/2020   ID:  [name], DOB Sep 30, 1951, MRN [id number]  PCP:  Binnie Rail, MD  Cardiologist:   Skeet Latch, MD   Chief Complaint  Patient presents with  . Chest Pain  . Shortness of Breath  . Cough    ocassional cough     History of Present Illness: [name] is a 69 y.o. male with hypertension, CAD status post PCI, stroke, diabetes mellitus type 2, hyperlipidemia, and prior tobacco abuse who presents for follow up.  [name] was referred by Dr. Billey Gosling on 02/09/16. [name] and PCI with a BMS to OM while living in [place].  He had in-stent restenosis in 2005 and had a DES placed.   At that time he reported occasional episodes of chest pain relieved by aspirin.  He denied exertional chest pain but did note dizziness and nausea.  Additionally, he was noted to have a left bundle branch block that was not present on his last EKG in 2013. Therefore, he was referred for exercise Myoview 02/13/16 that revealed LVEF 39% with global hypokinesis but no ischemia.  Echo 03/01/16 revealed LV 40-45% with mild LVH and grade 2 diastolic dysfunction. There was also mild hypokinesis of the mid to apical anteroseptum.   He works with our pharmacists and has achieved very good blood pressure control.  He is in a program at the [place] that checks his BP daily.    [name] had 2 falls in the winter time that he attributed to losing his balance. There was no preceding chest pain or palpitations.  Since his last appointment he has been feeling well.  For the last 1.5 months he is noted episodes of feeling sweaty and lightheaded.  He tends to notice this when doing errands.  He has been struggling with low blood sugars.  During these episodes his glucose has been 70 or lower.  It improves with having something to eat.  He has not had any chest pain or pressure lately and his breathing has been stable.  He has chronic lower extremity edema to the ankles  that seems to get better in the mornings but recurs during the day.  He has no orthopnea or PND.  He likes to ride the recumbent bike or walk but has not been exercising much lately.  He struggles with lack of time as he takes care of his grandchildren during the day.  He also sometimes gets pain and tightness in his calves.  It sometimes occurs when sitting or standing but he also has it when walking.  It is been ongoing for years and seems to be getting a little worse lately.  He was seen in the ED two days ago with a complaint of 2 months of chest pain, abdominal pain, back pain, and gas.  High-sensitivity troponin was elevated at 32.  EKG showed sinus rhythm with a left bundle branch block, which is chronic.  He left prior to receiving a follow-up lab.  He was not evaluated by a provider at that time.  Of note, he started Trulicity around the same time that the symptoms began.  He describes it as chest pain in the center of his chest that radiates to the left and right.  He sometimes gets a pinching sensation on the left.  He notes it after eating.  He also gets short of breath when it occurs.  The symptoms are worse the day after he  takes the Trulicity.  It got worse after increasing it to 1.5 mg from 0.36m.  He reduced it back to 0.724m He has some discomfort with walking and gets diaphoretic when it occurs.  He has chronic LE edema that is unchanged.  He reports orthopnea.  When he lays down he feels like his rib cage and chest are burning.     Past Medical History:  Diagnosis Date  . ALLERGIC RHINITIS   . CAD, NATIVE VESSEL    BMS to OM1 2001, DES to BMS 2005  . Chronic back pain    herniated disc  . Chronic combined systolic and diastolic heart failure ([facility]/06/2016  . Constipation    takes Carafate four times day  . DIAB W/UNSPEC COMP TYPE II/UNSPEC TYPE UNCNTRL   . ERECTILE DYSFUNCTION   . GERD   . HYPERLIPIDEMIA-MIXED   . HYPERTENSION, BENIGN   . LBBB (left bundle branch block)  02/02/2016  . MORTON'S NEUROMA, RIGHT   . Peripheral neuropathy   . Seasonal allergies    takes Allegra and Benadryl daily prn;uses Flonase daily  . SHOULDER PAIN, RIGHT   . Stroke, thrombotic ([facility]/2013   L HP + hemiparesis, s/p CIR   . TIA on medication 02/2012  . Unstable angina ([facility]/18/2021    Past Surgical History:  Procedure Laterality Date  . ANGIOPLASTY    . COLONOSCOPY    . CORONARY ANGIOPLASTY  2005   2 stents  . DENTAL SURGERY    . LARYNGOPLASTY  08/07/2012   Procedure: LARYNGOPLASTY;  Surgeon: JeIzora GalaMD;  [facility] Service: ENT;  Laterality: Left;  Left Vocal Cord Medialyzation  . left knee surgury     x 2   . stent  2001, 2004   coronary stents     Current Outpatient Medications  Medication Sig Dispense Refill  . atorvastatin (LIPITOR) 40 MG tablet Take 1 tablet (40 mg total) by mouth daily. 90 tablet 2  . Blood Glucose Monitoring Suppl (ONETOUCH VERIO) w/Device KIT USE METER TO CHECK GLUCOSE ONCE DAILY AS DIRECTED    . carvedilol (COREG) 12.5 MG tablet TAKE 1 & 1/2 (ONE & ONE-HALF) TABLETS BY MOUTH TWICE DAILY 270 tablet 3  . cetirizine (ZYRTEC) 10 MG tablet Take 10 mg by mouth as needed.    . chlorthalidone (HYGROTON) 25 MG tablet Take 1 tablet (25 mg total) by mouth daily. 90 tablet 3  . clopidogrel (PLAVIX) 75 MG tablet Take 1 tablet (75 mg total) by mouth daily. Please schedule annual appt in April for refills. Thank you 90 tablet 3  . Dulaglutide (TRULICITY) 0.2.77GAJ/2.8NOOPN Inject 0.5 mLs (0.75 mg total) into the skin once a week. 6 mL 3  . FIBER COMPLETE PO Take 1 Scoop by mouth daily. Whole Food Fiber    . fluticasone (FLONASE) 50 MCG/ACT nasal spray Place 2 sprays into both nostrils daily. 16 g 0  . glucose blood (ONETOUCH VERIO) test strip 1 each by Other route 2 (two) times daily. And lancets 2/day. 200 each 3  . Insulin Lispro Prot & Lispro (HUMALOG MIX 50/50 KWIKPEN) (50-50) 100 UNIT/ML Kwikpen Inject 20 Units into the skin daily  with breakfast. 15 mL 11  . losartan (COZAAR) 100 MG tablet Take 1 tablet by mouth once daily 90 tablet 1  . OneTouch Delica Lancets 3067EISC 1 each by Does not apply route 2 (two) times daily. Use to monitor glucose levels BID; E11.9 100 each 3  .  OVER THE COUNTER MEDICATION Take 6 tablets by mouth daily. NeuroRenew    . pantoprazole (PROTONIX) 40 MG tablet Take 1 tablet (40 mg total) by mouth 2 (two) times daily. 60 tablet 0  . RELION PEN NEEDLES 31G X 6 MM MISC USE 1 PEN NEEDLE ONCE DAILY 50 each 0  . RELION PEN NEEDLES 32G X 4 MM MISC USE 1 ONCE DAILY AS DIRECTED  11  . senna-docusate (SENOKOT-S) 8.6-50 MG per tablet Take 2 tablets by mouth 2 (two) times daily. For constipation.    . predniSONE (DELTASONE) 50 MG tablet TAKE 1 TABLET BY MOUTH 13 HOURS PRIOR AND 7 HOURS PRIOR TO PROCEDURE 2 tablet 0   No current facility-administered medications for this visit.    Allergies:   Penicillins, Pneumococcal vaccines, Shellfish allergy, Influenza vaccines, Lisinopril, Topiramate, and Amlodipine    Social History:  The patient  reports that he quit smoking about 37 years ago. He has never used smokeless tobacco. He reports that he does not drink alcohol and does not use drugs.   Family History:  The patient's family history includes Cancer in his mother.    ROS:  Please see the history of present illness.   Otherwise, review of systems are positive for L side weakness since his stroke and cough.  All other systems are reviewed and negative.    PHYSICAL EXAM: VS:  BP 122/66   Pulse 91   Temp (!) 97.2 F (36.2 C)   Ht 5' 11"  (1.803 m)   Wt 215 lb 12.8 oz (97.9 kg)   SpO2 99%   BMI 30.10 kg/m  , BMI Body mass index is 30.1 kg/m. GENERAL:  Well appearing HEENT: Pupils equal round and reactive, fundi not visualized, oral mucosa unremarkable NECK:  No jugular venous distention, waveform within normal limits, carotid upstroke brisk and symmetric, no bruits, no thyromegaly LYMPHATICS:  No  cervical adenopathy LUNGS:  Clear to auscultation bilaterally HEART:  RRR.  PMI not displaced or sustained,S1 and S2 within normal limits, no S3, no S4, no clicks, no rubs, no murmurs ABD:  Flat, positive bowel sounds normal in frequency in pitch, no bruits, no rebound, no guarding, no midline pulsatile mass, no hepatomegaly, no splenomegaly EXT:  2 plus pulses throughout, no edema, no cyanosis no clubbing SKIN:  No rashes no nodules NEURO:  Cranial nerves II through XII grossly intact, motor grossly intact throughout PSYCH:  Cognitively intact, oriented to person place and time  EKG:  EKG is ordered today. The ekg ordered 3//13/17 demonstrates sinus rhythm rate 64 bpm.  LBBB.   07/20/17: Sinus rhythm. Rate 74 bpm. Left bundle branch block. 09/01/18; Sinus rhythm.  Rate 71 bpm.  LBBB 07/09/20: Sinus rhythm.  Rate 91 bpm.  LBBB.  Echo 09/20/18: Study Conclusions   - Left ventricle: abnormal septal motion. The cavity size was  mildly dilated. Wall thickness was increased in a pattern of  severe LVH. Systolic function was mildly to moderately reduced.  The estimated ejection fraction was in the range of 40% to 45%.  Doppler parameters are consistent with abnormal left ventricular  relaxation (grade 1 diastolic dysfunction).  - Atrial septum: There was increased thickness of the septum,  consistent with lipomatous hypertrophy. No defect or patent  foramen ovale was identified.   Carotid Doppler 10/24/14: Normal study  Lexiscan Myoview 10/2018:  Nuclear stress EF: 43%. The left ventricular ejection fraction is moderately decreased (30-44%).  Defect 1: There is a medium defect of moderate severity present  in the mid anteroseptal, mid inferoseptal, apical septal and apex location. This septal defect is likely due to the LBBB.  This is an intermediate risk study. This study is similar to the previous study in 2017.  ABI 08/2018: Normal bilaterally  Recent Labs: 10/17/2019:  TSH 2.08 07/07/2020: ALT 28; BUN 25; Creatinine, Ser 2.06; Hemoglobin 11.3; Platelets 218; Potassium 3.4; Sodium 139    Lipid Panel    Component Value Date/Time   CHOL 112 04/15/2020 1128   CHOL 164 07/20/2017 1202   TRIG 136.0 04/15/2020 1128   TRIG 200 04/20/2009 0000   HDL 44.60 04/15/2020 1128   HDL 55 07/20/2017 1202   CHOLHDL 3 04/15/2020 1128   VLDL 27.2 04/15/2020 1128   LDLCALC 40 04/15/2020 1128   LDLCALC 82 07/20/2017 1202   LDLDIRECT 63.0 01/12/2016 0931      Wt Readings from Last 3 Encounters:  07/09/20 215 lb 12.8 oz (97.9 kg)  06/13/20 (!) 222 lb (100.7 kg)  05/12/20 218 lb (98.9 kg)      ASSESSMENT AND PLAN:  # CAD s/p PCI x2: # Unstable angina:  Prior PCI of OM with in-stent restenosis and repeat PCI.  He now has chest pain both after eating and with exertion.  He notes that the timing started after his initiation of Trulicity.  However given his cardiac history and the fact that he had positive cardiac enzymes in the ED, I think we need to have an ischemia evaluation.  We will repeat his high-sensitivity troponin now.  We will get him Covid tested and plan for cath tomorrow.  Continue clopidogrel, atorvastatin, and carvedilol.  We will have him hold his losartan and chlorthalidone in preparation for cath.  He will need precath hydration.  Increase carvedilol to 25 mg twice daily.  # Chronic systolic and diastolic heart failure: Mr. Castilla is euvolemic.  It seems unlikely that his elevated cardiac enzymes are attributable to acute on chronic heart failure.  # Hypertension: BP is well-controlled.  However his renal function is worsening.  We will have him hold his chlorthalidone and losartan and plans for cath as above.  Given his chronic kidney disease we will need to think about his blood pressure medications.  We will make sure he gets a basic metabolic panel 1 week after cath.   # Hyperlipidemia: LDL 40 on 03/2020.  Continue atorvastatin.   # Claudication:  PT/PT pulses are diminished. We will get ABIs.     # CKD IV:  Holding chlorthalidone and losartan prior to cath.  Referral to Nephrology.   Current medicines are reviewed at length with the patient today.  The patient does not have concerns regarding medicines.  The following changes have been made: No changes  Labs/ tests ordered today include:    Orders Placed This Encounter  Procedures  . Troponin I  . EKG 12-Lead     Disposition:   FU with Demiyah Fischbach C. Oval Linsey, MD, [facility] in 1 month     Signed, Prentis Langdon C. Oval Linsey, MD, [facility]  07/09/2020 10:33 AM    [place]

## 2020-07-09 NOTE — Telephone Encounter (Signed)
Called and spoke with pt, he states he is having a procedure tomorrow morning at 6 and his paper work states to take his plavix the morning of. Pt reports that he has always taken his plavix at night. He would like to know if he is supposed to hold his night time does and take it in the morning? Or if there is a different way he is supposed to take it? Notified I would send this message to our pharmacists to review and advise on.

## 2020-07-09 NOTE — Telephone Encounter (Signed)
Called and spoke with pt, notified that per our pharmacist he should take his dose of plavix tonight and then take his dose tomorrow prior to his cath and then continue to take his plavix in the morning thereafter.  Pt states the other nurse had called him and told him to go ahead and take his plavix today and that he could take it again tomorrow prior to his procedure.  Notified that if he has already taken his dose for the day that this was fine and that he should still take his plavix prior to his procedure tomorrow morning and then continue just taking it in the morning after this.  Pt verbalized understanding with no other questions at this time.

## 2020-07-09 NOTE — Telephone Encounter (Signed)
Patient is going to CathLab in the morning and needs to take Plavix before the procedure. Okay to take dose tonight and tomorrow morning. Please continue to take every morning thereafter.

## 2020-07-10 ENCOUNTER — Encounter (HOSPITAL_COMMUNITY): Payer: Self-pay | Admitting: Anesthesiology

## 2020-07-10 ENCOUNTER — Other Ambulatory Visit: Payer: Self-pay

## 2020-07-10 ENCOUNTER — Encounter (HOSPITAL_COMMUNITY): Payer: Self-pay | Admitting: Cardiology

## 2020-07-10 ENCOUNTER — Encounter (HOSPITAL_COMMUNITY)
Admission: RE | Disposition: A | Discharge status: Home / Self Care | Payer: Self-pay | Source: Home / Self Care | Attending: Cardiology

## 2020-07-10 ENCOUNTER — Telehealth: Payer: Self-pay | Admitting: Physician Assistant

## 2020-07-10 ENCOUNTER — Ambulatory Visit (HOSPITAL_COMMUNITY)
Admission: RE | Admit: 2020-07-10 | Discharge: 2020-07-10 | Disposition: A | Discharge status: Home / Self Care | Payer: Medicare HMO | Attending: Cardiology | Admitting: Cardiology

## 2020-07-10 DIAGNOSIS — E1151 Type 2 diabetes mellitus with diabetic peripheral angiopathy without gangrene: Secondary | ICD-10-CM | POA: Insufficient documentation

## 2020-07-10 DIAGNOSIS — R079 Chest pain, unspecified: Secondary | ICD-10-CM

## 2020-07-10 DIAGNOSIS — E1159 Type 2 diabetes mellitus with other circulatory complications: Secondary | ICD-10-CM

## 2020-07-10 DIAGNOSIS — Z87891 Personal history of nicotine dependence: Secondary | ICD-10-CM | POA: Diagnosis not present

## 2020-07-10 DIAGNOSIS — I200 Unstable angina: Secondary | ICD-10-CM | POA: Diagnosis present

## 2020-07-10 DIAGNOSIS — Z887 Allergy status to serum and vaccine status: Secondary | ICD-10-CM | POA: Insufficient documentation

## 2020-07-10 DIAGNOSIS — Z79899 Other long term (current) drug therapy: Secondary | ICD-10-CM | POA: Insufficient documentation

## 2020-07-10 DIAGNOSIS — N184 Chronic kidney disease, stage 4 (severe): Secondary | ICD-10-CM | POA: Insufficient documentation

## 2020-07-10 DIAGNOSIS — Z794 Long term (current) use of insulin: Secondary | ICD-10-CM | POA: Insufficient documentation

## 2020-07-10 DIAGNOSIS — I5042 Chronic combined systolic (congestive) and diastolic (congestive) heart failure: Secondary | ICD-10-CM | POA: Insufficient documentation

## 2020-07-10 DIAGNOSIS — I2510 Atherosclerotic heart disease of native coronary artery without angina pectoris: Secondary | ICD-10-CM

## 2020-07-10 DIAGNOSIS — D649 Anemia, unspecified: Secondary | ICD-10-CM | POA: Diagnosis not present

## 2020-07-10 DIAGNOSIS — E785 Hyperlipidemia, unspecified: Secondary | ICD-10-CM | POA: Diagnosis present

## 2020-07-10 DIAGNOSIS — I25110 Atherosclerotic heart disease of native coronary artery with unstable angina pectoris: Secondary | ICD-10-CM | POA: Diagnosis not present

## 2020-07-10 DIAGNOSIS — I130 Hypertensive heart and chronic kidney disease with heart failure and stage 1 through stage 4 chronic kidney disease, or unspecified chronic kidney disease: Secondary | ICD-10-CM | POA: Insufficient documentation

## 2020-07-10 DIAGNOSIS — D638 Anemia in other chronic diseases classified elsewhere: Secondary | ICD-10-CM | POA: Diagnosis present

## 2020-07-10 DIAGNOSIS — E119 Type 2 diabetes mellitus without complications: Secondary | ICD-10-CM

## 2020-07-10 DIAGNOSIS — Z880 Allergy status to penicillin: Secondary | ICD-10-CM | POA: Insufficient documentation

## 2020-07-10 DIAGNOSIS — I5032 Chronic diastolic (congestive) heart failure: Secondary | ICD-10-CM | POA: Diagnosis present

## 2020-07-10 DIAGNOSIS — Z955 Presence of coronary angioplasty implant and graft: Secondary | ICD-10-CM | POA: Diagnosis not present

## 2020-07-10 DIAGNOSIS — I447 Left bundle-branch block, unspecified: Secondary | ICD-10-CM | POA: Diagnosis not present

## 2020-07-10 DIAGNOSIS — N183 Chronic kidney disease, stage 3 unspecified: Secondary | ICD-10-CM | POA: Diagnosis present

## 2020-07-10 DIAGNOSIS — K219 Gastro-esophageal reflux disease without esophagitis: Secondary | ICD-10-CM | POA: Insufficient documentation

## 2020-07-10 DIAGNOSIS — E1142 Type 2 diabetes mellitus with diabetic polyneuropathy: Secondary | ICD-10-CM | POA: Diagnosis not present

## 2020-07-10 DIAGNOSIS — Z8673 Personal history of transient ischemic attack (TIA), and cerebral infarction without residual deficits: Secondary | ICD-10-CM | POA: Diagnosis not present

## 2020-07-10 DIAGNOSIS — E1169 Type 2 diabetes mellitus with other specified complication: Secondary | ICD-10-CM | POA: Diagnosis present

## 2020-07-10 DIAGNOSIS — Z7902 Long term (current) use of antithrombotics/antiplatelets: Secondary | ICD-10-CM | POA: Diagnosis not present

## 2020-07-10 DIAGNOSIS — I152 Hypertension secondary to endocrine disorders: Secondary | ICD-10-CM | POA: Diagnosis present

## 2020-07-10 DIAGNOSIS — N1832 Chronic kidney disease, stage 3b: Secondary | ICD-10-CM | POA: Diagnosis present

## 2020-07-10 DIAGNOSIS — Z888 Allergy status to other drugs, medicaments and biological substances status: Secondary | ICD-10-CM | POA: Diagnosis not present

## 2020-07-10 DIAGNOSIS — E1122 Type 2 diabetes mellitus with diabetic chronic kidney disease: Secondary | ICD-10-CM | POA: Insufficient documentation

## 2020-07-10 DIAGNOSIS — I10 Essential (primary) hypertension: Secondary | ICD-10-CM | POA: Diagnosis present

## 2020-07-10 HISTORY — PX: LEFT HEART CATH AND CORONARY ANGIOGRAPHY: CATH118249

## 2020-07-10 HISTORY — PX: CORONARY STENT INTERVENTION: CATH118234

## 2020-07-10 LAB — GLUCOSE, CAPILLARY
Glucose-Capillary: 239 mg/dL — ABNORMAL HIGH (ref 70–99)
Glucose-Capillary: 226 mg/dL — ABNORMAL HIGH (ref 70–99)

## 2020-07-10 LAB — BASIC METABOLIC PANEL
Anion gap: 10 (ref 5–15)
BUN: 29 mg/dL — ABNORMAL HIGH (ref 8–23)
CO2: 24 mmol/L (ref 22–32)
Calcium: 9.1 mg/dL (ref 8.9–10.3)
Chloride: 103 mmol/L (ref 98–111)
Creatinine, Ser: 2.03 mg/dL — ABNORMAL HIGH (ref 0.61–1.24)
GFR calc Af Amer: 37 mL/min — ABNORMAL LOW (ref >60)
GFR calc non Af Amer: 32 mL/min — ABNORMAL LOW (ref >60)
Glucose, Bld: 259 mg/dL — ABNORMAL HIGH (ref 70–99)
Potassium: 3.9 mmol/L (ref 3.5–5.1)
Sodium: 137 mmol/L (ref 135–145)

## 2020-07-10 LAB — CBC
HCT: 29.3 % — ABNORMAL LOW (ref 39.0–52.0)
Hemoglobin: 9.7 g/dL — ABNORMAL LOW (ref 13.0–17.0)
MCH: 30.8 pg (ref 26.0–34.0)
MCHC: 33.1 g/dL (ref 30.0–36.0)
MCV: 93 fL (ref 80.0–100.0)
Platelets: 190 10*3/uL (ref 150–400)
RBC: 3.15 MIL/uL — ABNORMAL LOW (ref 4.22–5.81)
RDW: 11.8 % (ref 11.5–15.5)
WBC: 5.1 10*3/uL (ref 4.0–10.5)
nRBC: 0 % (ref 0.0–0.2)

## 2020-07-10 LAB — MRSA PCR SCREENING: MRSA by PCR: NEGATIVE

## 2020-07-10 LAB — PROTIME-INR
INR: 1.1 (ref 0.8–1.2)
Prothrombin Time: 14.2 seconds (ref 11.4–15.2)

## 2020-07-10 LAB — POCT ACTIVATED CLOTTING TIME: Activated Clotting Time: 323 seconds

## 2020-07-10 SURGERY — LEFT HEART CATH AND CORONARY ANGIOGRAPHY
Anesthesia: LOCAL

## 2020-07-10 MED ORDER — FENTANYL CITRATE (PF) 100 MCG/2ML IJ SOLN
INTRAMUSCULAR | Status: AC
  Filled 2020-07-10: qty 2

## 2020-07-10 MED ORDER — HEPARIN SODIUM (PORCINE) 1000 UNIT/ML IJ SOLN
INTRAMUSCULAR | Status: AC
  Filled 2020-07-10: qty 1

## 2020-07-10 MED ORDER — NITROGLYCERIN 0.4 MG SL SUBL: 0.4000 mg | SUBLINGUAL_TABLET | SUBLINGUAL | 3 refills | Status: DC | PRN

## 2020-07-10 MED ORDER — SODIUM CHLORIDE 0.9% FLUSH: 3.0000 mL | Freq: Two times a day (BID) | INTRAVENOUS | Status: DC

## 2020-07-10 MED ORDER — SODIUM CHLORIDE 0.9% FLUSH: 3.0000 mL | INTRAVENOUS | Status: DC | PRN

## 2020-07-10 MED ORDER — HEPARIN (PORCINE) IN NACL 1000-0.9 UT/500ML-% IV SOLN
INTRAVENOUS | Status: DC | PRN
  Administered 2020-07-10 (×2): 500 mL

## 2020-07-10 MED ORDER — SODIUM CHLORIDE 0.9 % IV SOLN: 250.0000 mL | INTRAVENOUS | Status: DC | PRN

## 2020-07-10 MED ORDER — ASPIRIN 81 MG PO CHEW
81.0000 mg | CHEWABLE_TABLET | ORAL | Status: AC
  Administered 2020-07-10: 81 mg via ORAL
  Filled 2020-07-10: qty 1

## 2020-07-10 MED ORDER — NITROGLYCERIN 1 MG/10 ML FOR IR/CATH LAB
INTRA_ARTERIAL | Status: AC
  Filled 2020-07-10: qty 10

## 2020-07-10 MED ORDER — SODIUM CHLORIDE 0.9 % IV SOLN: INTRAVENOUS | Status: DC

## 2020-07-10 MED ORDER — LIDOCAINE HCL (PF) 1 % IJ SOLN
INTRAMUSCULAR | Status: DC | PRN
  Administered 2020-07-10: 2 mL

## 2020-07-10 MED ORDER — MIDAZOLAM HCL 2 MG/2ML IJ SOLN
INTRAMUSCULAR | Status: AC
  Filled 2020-07-10: qty 2

## 2020-07-10 MED ORDER — ACETAMINOPHEN 325 MG PO TABS: 650.0000 mg | ORAL_TABLET | ORAL | Status: DC | PRN

## 2020-07-10 MED ORDER — MIDAZOLAM HCL 2 MG/2ML IJ SOLN
INTRAMUSCULAR | Status: DC | PRN
  Administered 2020-07-10: 1 mg via INTRAVENOUS

## 2020-07-10 MED ORDER — FENTANYL CITRATE (PF) 100 MCG/2ML IJ SOLN
INTRAMUSCULAR | Status: DC | PRN
Start: 2020-07-10 — End: 2020-07-10
  Administered 2020-07-10: 25 ug via INTRAVENOUS

## 2020-07-10 MED ORDER — HUMALOG MIX 50/50 KWIKPEN (50-50) 100 UNIT/ML ~~LOC~~ SUPN: 32.0000 [IU] | PEN_INJECTOR | Freq: Every day | SUBCUTANEOUS | Status: DC

## 2020-07-10 MED ORDER — VERAPAMIL HCL 2.5 MG/ML IV SOLN
INTRAVENOUS | Status: DC | PRN
  Administered 2020-07-10: 10 mL via INTRA_ARTERIAL

## 2020-07-10 MED ORDER — SODIUM CHLORIDE 0.9 % WEIGHT BASED INFUSION: 1.0000 mL/kg/h | INTRAVENOUS | Status: DC

## 2020-07-10 MED ORDER — LIDOCAINE HCL (PF) 1 % IJ SOLN
INTRAMUSCULAR | Status: AC
  Filled 2020-07-10: qty 30

## 2020-07-10 MED ORDER — SODIUM CHLORIDE 0.9 % WEIGHT BASED INFUSION: 3.0000 mL/kg/h | INTRAVENOUS | Status: AC

## 2020-07-10 MED ORDER — VERAPAMIL HCL 2.5 MG/ML IV SOLN
INTRAVENOUS | Status: AC
  Filled 2020-07-10: qty 2

## 2020-07-10 MED ORDER — ASPIRIN EC 81 MG PO TBEC: 81.0000 mg | DELAYED_RELEASE_TABLET | Freq: Every day | ORAL | 11 refills | Status: AC

## 2020-07-10 MED ORDER — HEPARIN SODIUM (PORCINE) 1000 UNIT/ML IJ SOLN
INTRAMUSCULAR | Status: DC | PRN
  Administered 2020-07-10 (×2): 5000 [IU] via INTRAVENOUS

## 2020-07-10 MED ORDER — IOHEXOL 350 MG/ML SOLN
INTRAVENOUS | Status: DC | PRN
  Administered 2020-07-10: 120 mL via INTRA_ARTERIAL

## 2020-07-10 MED ORDER — HEPARIN (PORCINE) IN NACL 1000-0.9 UT/500ML-% IV SOLN
INTRAVENOUS | Status: AC
  Filled 2020-07-10: qty 1000

## 2020-07-10 MED ORDER — ONDANSETRON HCL 4 MG/2ML IJ SOLN: 4.0000 mg | Freq: Four times a day (QID) | INTRAMUSCULAR | Status: DC | PRN

## 2020-07-10 MED ORDER — NITROGLYCERIN 1 MG/10 ML FOR IR/CATH LAB
INTRA_ARTERIAL | Status: DC | PRN
  Administered 2020-07-10 (×2): 200 ug via INTRACORONARY

## 2020-07-10 SURGICAL SUPPLY — 18 items
BALLN SAPPHIRE 2.5X12 (BALLOONS) ×2
BALLN SAPPHIRE ~~LOC~~ 3.0X12 (BALLOONS) ×1 IMPLANT
BALLN WOLVERINE 2.50X10 (BALLOONS) ×2
BALLOON SAPPHIRE 2.5X12 (BALLOONS) IMPLANT
BALLOON WOLVERINE 2.50X10 (BALLOONS) IMPLANT
CATH 5FR JL3.5 JR4 ANG PIG MP (CATHETERS) ×1 IMPLANT
CATH LAUNCHER 6FR EBU3.5 (CATHETERS) ×1 IMPLANT
DEVICE RAD COMP TR BAND LRG (VASCULAR PRODUCTS) ×1 IMPLANT
GLIDESHEATH SLEND SS 6F .021 (SHEATH) ×1 IMPLANT
GUIDEWIRE INQWIRE 1.5J.035X260 (WIRE) IMPLANT
INQWIRE 1.5J .035X260CM (WIRE) ×2
KIT ENCORE 26 ADVANTAGE (KITS) ×1 IMPLANT
KIT HEART LEFT (KITS) ×2 IMPLANT
PACK CARDIAC CATHETERIZATION (CUSTOM PROCEDURE TRAY) ×2 IMPLANT
STENT RESOLUTE ONYX 2.75X8 (Permanent Stent) ×1 IMPLANT
TRANSDUCER W/STOPCOCK (MISCELLANEOUS) ×2 IMPLANT
TUBING CIL FLEX 10 FLL-RA (TUBING) ×2 IMPLANT
WIRE ASAHI PROWATER 180CM (WIRE) ×2 IMPLANT

## 2020-07-10 NOTE — Interval H&P Note (Signed)
History and Physical Interval Note:  07/10/2020 8:51 AM  [name]  has presented today for surgery, with the diagnosis of cp.  The various methods of treatment have been discussed with the patient and family. After consideration of risks, benefits and other options for treatment, the patient has consented to  Procedure(s): LEFT HEART CATH AND CORONARY ANGIOGRAPHY (N/A) as a surgical intervention.  The patient's history has been reviewed, patient examined, no change in status, stable for surgery.  I have reviewed the patient's chart and labs.  Questions were answered to the patient's satisfaction.   Cath Lab Visit (complete for each Cath Lab visit)  Clinical Evaluation Leading to the Procedure:   ACS: Yes.    Non-ACS:    Anginal Classification: CCS III  Anti-ischemic medical therapy: Minimal Therapy (1 class of medications)  Non-Invasive Test Results: No non-invasive testing performed  Prior CABG: No previous CABG        [facility] 07/10/2020 8:51 AM

## 2020-07-10 NOTE — Progress Notes (Addendum)
We are having issues placing formal discharge order. I get a warning that I cannot place a discharge order without an admit order because the patient is listed as "inpatient." I called IT and they logged into my desktop to help. The patient was registered as "inpatient" class when the procedure was scheduled, although no bed request needed. IT cannot fix it using my login so they have launched a high priority ticket to change the class. Patient was physically discharged successfully from short stay without event.  Addendum: Another IT user called back and said patient erroneously was listed under unit 2c. They fixed the issue and discharge order was successful.  Kaamil Morefield PA-C

## 2020-07-10 NOTE — Progress Notes (Signed)
CARDIAC REHAB PHASE I   Stent education completed with pt. Pt educated on importance of ASA and Plavix. Pt given heart healthy and diabetic diets. Reviewed site care, restrictions, and exercise guidelines. Will refer to CRP II [place]. Pt is interested in participating in Virtual Cardiac and Pulmonary Rehab. Pt advised that Virtual Cardiac and Pulmonary Rehab is provided at no cost to the patient.  Checklist:  1. Pt has smart device  ie smartphone and/or ipad for downloading an app  Yes 2. Reliable internet/wifi service    Yes 3. Understands how to use their smartphone and navigate within an app.  Yes  Pt verbalized understanding and is in agreement.   [id number] Rufina Falco, RN BSN 07/10/2020 2:14 PM

## 2020-07-10 NOTE — Discharge Summary (Addendum)
Discharge Summary for Same Day PCI   Patient ID: [name] MRN: [id number]; DOB: [birth year]  Admit date: 07/10/2020 Discharge date: 07/10/2020   Primary Care Provider: Binnie Rail, MD  Primary Cardiologist: Skeet Latch, MD  Primary Electrophysiologist:  None   Discharge Diagnoses    Principal Problem:   Unstable angina [facility]) Active Problems:   Diabetes ([place])   Hyperlipidemia   Essential hypertension   CAD S/P percutaneous coronary angioplasty   LBBB (left bundle branch block)   Chronic combined systolic and diastolic heart failure ([place])   Anemia   CRI (chronic renal insufficiency), stage 3 (moderate)    Diagnostic Studies/Procedures    Cardiac Catheterization 07/10/2020:   2nd Mrg-1 lesion is 95% stenosed.  2nd Mrg-2 lesion is 30% stenosed.  RPDA lesion is 100% stenosed.  A drug-eluting stent was successfully placed using a STENT RESOLUTE ONYX 2.75X8.  Post intervention, there is a 0% residual stenosis.  Post intervention, there is a 0% residual stenosis.  LV end diastolic pressure is normal.   1. 2 vessel obstructive CAD    -95% ostial OM2. This was at the proximal margin of the old stents    - 100% distal PDA. This fills by left to right collaterals. 2. Normal LVEDP 3. Successful PCI of the ostium of the OM with DES x 1.  Plan: DAPT indefinitely given multiple stents in OM. Anticipate same day DC. Will hold losartan and HCTZ until renal function repeated as outpatient.   _____________   History of Present Illness     [name] is a 69 y.o. male with hypertension, CAD s/p prior PCIs stroke, diabetes mellitus type 2, hyperlipidemia, tobacco abuse, LBBB, chronic combined CHF, seasonal allergies, CKD stage IIIb, anemia who presented to [facility] for planned cath.  He was seen in the ED 07/07/20 with a complaint of 2 months of chest pain, abdominal pain, back pain, and gas.  High-sensitivity troponin was elevated at 32.  EKG showed sinus  rhythm with a left bundle branch block, which was chronic.  He left prior to receiving a follow-up lab due to the wait time.  He was not evaluated by a provider at that time. He was seen back in the office yesterday for these symptoms. He also reported leg discomfort which Dr. Oval Linsey plans to pursue ABIs for. Given his chest pain and known CAD, cardiac catheterization was arranged for further evaluation.  Hospital Course     The patient underwent cardiac cath as noted above with DES to the ostium of the OM. Plan for DAPT with ASA/Plavix uninterrupted indefinitely.The patient was seen by cardiac rehab while in short stay. There were no observed complications post cath. Radial cath site was re-evaluated prior to discharge and found to be stable without any complications. Instructions/precautions regarding cath site care were given prior to discharge.  [name] was seen by Dr. Martinique and determined stable for discharge home. Follow up with our office has been arranged. Medications are listed below. Cetirizine (Zyrtec), Fiber Complete, fluticasone, NeuroRenew, pantoprazole were removed from [facility] because indicated he's no longer taking them (and prednisone was only pre-cath). His list was updated to reflect that he reported taking 32 units of insulin daily. We also sent in rx for SL NTG and aspirin. Dr. Martinique recommends to hold losartan and chlorthalidone until renal function repeated as outpatient. Would also recommend CBC repeated at that visit given anemia noted on labs (no acute bleeding reported).  _____________  Cath/PCI Registry Performance & Quality Measures: 1.  Aspirin prescribed? - Yes 2. ADP Receptor Inhibitor (Plavix/Clopidogrel, Brilinta/Ticagrelor or Effient/Prasugrel) prescribed (includes medically managed patients)? - Yes 3. High Intensity Statin (Lipitor 40-30m or Crestor 20-454m prescribed? - Yes 4. For EF <40%, was ACEI/ARB prescribed? - No - Reason:  CKD - holding off until  seen back as outpatient 5. For EF <40%, Aldosterone Antagonist (Spironolactone or Eplerenone) prescribed? - No - Reason:  CKD - holding off until seen back as outpatient 6. Cardiac Rehab Phase II ordered (Included Medically managed Patients)? - Yes  _____________   Discharge Vitals Blood pressure (!) 123/58, pulse 97, temperature 97.8 F (36.6 C), temperature source Oral, resp. rate 17, height 5' 11" (1.803 m), weight 97.6 kg, SpO2 100 %.  Filed Weights   07/10/20 0447  Weight: 97.6 kg    Last Labs & Radiologic Studies    CBC Recent Labs    07/10/20 0828  WBC 5.1  HGB 9.7*  HCT 29.3*  MCV 93.0  PLT 19681 Basic Metabolic Panel Recent Labs    07/10/20 0620  NA 137  K 3.9  CL 103  CO2 24  GLUCOSE 259*  BUN 29*  CREATININE 2.03*  CALCIUM 9.1   Liver Function Tests No results for input(s): AST, ALT, ALKPHOS, BILITOT, PROT, ALBUMIN in the last 72 hours. No results for input(s): LIPASE, AMYLASE in the last 72 hours. High Sensitivity Troponin:   Recent Labs  Lab 07/07/20 1207  TROPONINIHS 32*  _____________  DG Chest 2 View  Result Date: 07/07/2020 CLINICAL DATA:  Chest pain EXAM: CHEST - 2 VIEW COMPARISON:  05/12/2020 FINDINGS: No consolidation or edema. No pleural effusion or pneumothorax. Cardiomediastinal contours are within normal limits. No acute osseous abnormality. IMPRESSION: No acute process in the chest. Electronically Signed   By: PrMacy Mis.D.   On: 07/07/2020 14:33   CARDIAC CATHETERIZATION  Result Date: 07/10/2020  2nd Mrg-1 lesion is 95% stenosed.  2nd Mrg-2 lesion is 30% stenosed.  RPDA lesion is 100% stenosed.  A drug-eluting stent was successfully placed using a STENT RESOLUTE ONYX 2.75X8.  Post intervention, there is a 0% residual stenosis.  Post intervention, there is a 0% residual stenosis.  LV end diastolic pressure is normal.  1. 2 vessel obstructive CAD    -95% ostial OM2. This was at the proximal margin of the old stents    - 100%  distal PDA. This fills by left to right collaterals. 2. Normal LVEDP 3. Successful PCI of the ostium of the OM with DES x 1. Plan: DAPT indefinitely given multiple stents in OM. Anticipate same day DC. Will hold losartan and HCTZ until renal function repeated as outpatient.    Disposition   Pt is being discharged home today in good condition.  Follow-up Plans & Appointments     Follow-up Information    RaSkeet LatchMD Follow up.   Specialty: Cardiology Why: Keep follow-up appointment as scheduled below on Wednesday July 16, 2020 1:20 PM (Arrive by 1:05 PM) Contact information: [id number] [place] [phone]            Discharge Instructions    Amb Referral to Cardiac Rehabilitation   Complete by: As directed    Diagnosis: Coronary Stents   After initial evaluation and assessments completed: Virtual Based Care may be provided alone or in conjunction with Phase 2 Cardiac Rehab based on patient barriers.: Yes   Diet - low sodium heart healthy   Complete by: As directed  Discharge instructions   Complete by: As directed    Cetirizine (Zyrtec), Fiber Complete, fluticasone, NeuroRenew, and pantoprazole were removed from your medicine list since you indicated you are no longer taking these.  Please stop your chlorthalidone and losartan for now - when you see Dr. Oval Linsey in follow-up she should check your kidney function to make sure it's safe to restart.  Prednisone was removed from your list since this was only a pre-procedure medicine.  Your medicine list was updated to reflect you told us you are taking 32 units of insulin daily.  We sent in a new prescription for as-needed nitroglycerin sent into your pharmacy.  Your blood counts showed you are anemic. If you notice any bleeding such as blood in stool, black tarry stools, blood in urine, nosebleeds or any other unusual bleeding, call your doctor immediately. It is not normal to have  this kind of bleeding while on a blood thinner and usually indicates there is an underlying problem with one of your body systems that needs to be checked out.   Increase activity slowly   Complete by: As directed    No driving for 3 days (do not return to driving if you have previously been instructed to avoid this completely). No lifting over 5 lbs for 1 week. No sexual activity for 1 week. Keep procedure site clean & dry. If you notice increased pain, swelling, bleeding or pus, call/return!  You may shower, but no soaking baths/hot tubs/pools for 1 week.       Discharge Medications   Allergies as of 07/10/2020      Reactions   Penicillins Anaphylaxis   Pneumococcal Vaccines Anaphylaxis   Shellfish Allergy Anaphylaxis   Influenza Vaccines    Lisinopril Cough   Topiramate Other (See Comments)   Chest spasms and numbness   Amlodipine Other (See Comments)   LE edema      Medication List    STOP taking these medications   cetirizine 10 MG tablet Commonly known as: ZYRTEC   chlorthalidone 25 MG tablet Commonly known as: HYGROTON   FIBER COMPLETE PO   fluticasone 50 MCG/ACT nasal spray Commonly known as: FLONASE   losartan 100 MG tablet Commonly known as: COZAAR   OVER THE COUNTER MEDICATION   pantoprazole 40 MG tablet Commonly known as: PROTONIX   predniSONE 50 MG tablet Commonly known as: DELTASONE     TAKE these medications   aspirin EC 81 MG tablet Take 1 tablet (81 mg total) by mouth daily. Swallow whole.   atorvastatin 40 MG tablet Commonly known as: LIPITOR Take 1 tablet (40 mg total) by mouth daily.   calcium carbonate 500 MG chewable tablet Commonly known as: TUMS - dosed in mg elemental calcium Chew 1 tablet by mouth daily as needed for indigestion or heartburn.   carvedilol 12.5 MG tablet Commonly known as: COREG TAKE 1 & 1/2 (ONE & ONE-HALF) TABLETS BY MOUTH TWICE DAILY What changed: See the new instructions.   clopidogrel 75 MG  tablet Commonly known as: PLAVIX Take 1 tablet (75 mg total) by mouth daily. Please schedule annual appt in April for refills. Thank you   HumaLOG Mix 50/50 KwikPen (50-50) 100 UNIT/ML Kwikpen Generic drug: Insulin Lispro Prot & Lispro Inject 32 Units into the skin daily with breakfast.   nitroGLYCERIN 0.4 MG SL tablet Commonly known as: Nitrostat Place 1 tablet (0.4 mg total) under the tongue every 5 (five) minutes as needed for chest pain (up to 3 doses).  OneTouch Delica Lancets 17E Misc 1 each by Does not apply route 2 (two) times daily. Use to monitor glucose levels BID; E11.9   OneTouch Verio test strip Generic drug: glucose blood 1 each by Other route 2 (two) times daily. And lancets 2/day.   OneTouch Verio w/Device Kit USE METER TO CHECK GLUCOSE ONCE DAILY AS DIRECTED   ReliOn Pen Needles 32G X 4 MM Misc Generic drug: Insulin Pen Needle USE 1 ONCE DAILY AS DIRECTED   ReliOn Pen Needles 31G X 6 MM Misc Generic drug: Insulin Pen Needle USE 1 PEN NEEDLE ONCE DAILY   senna-docusate 8.6-50 MG tablet Commonly known as: Senokot-S Take 2 tablets by mouth 2 (two) times daily. For constipation.   simethicone 125 MG chewable tablet Commonly known as: MYLICON Chew 081 mg by mouth every 6 (six) hours as needed for flatulence.   Trulicity 4.48 JE/5.6DJ Sopn Generic drug: Dulaglutide Inject 0.5 mLs (0.75 mg total) into the skin once a week.          Allergies Allergies  Allergen Reactions  . Penicillins Anaphylaxis  . Pneumococcal Vaccines Anaphylaxis  . Shellfish Allergy Anaphylaxis  . Influenza Vaccines   . Lisinopril Cough  . Topiramate Other (See Comments)    Chest spasms and numbness  . Amlodipine Other (See Comments)    LE edema    Outstanding Labs/Studies   Recommend BMET/CBC at follow-up  Duration of Discharge Encounter   Greater than 30 minutes including physician time.  Signed, Charlie Pitter, PA-C 07/10/2020, 4:44 PM

## 2020-07-10 NOTE — Progress Notes (Signed)
Inpatient Diabetes Program Recommendations  AACE/ADA: New Consensus Statement on Inpatient Glycemic Control (2015)  Target Ranges:  Prepandial:   less than 140 mg/dL      Peak postprandial:   less than 180 mg/dL (1-2 hours)      Critically ill patients:  140 - 180 mg/dL   Lab Results  Component Value Date   GLUCAP 226 (H) 07/10/2020   HGBA1C 7.5 (A) 06/13/2020    Review of Glycemic Control Results for [name], [name] (MRN [id number]) as of 07/10/2020 14:21  Ref. Range 07/10/2020 10:35  Glucose-Capillary Latest Ref Range: 70 - 99 mg/dL 226 (H)   Diabetes history: Type 2 DM Outpatient Diabetes medications: Trulicity 0.09 mg Qwk Current orders for Inpatient glycemic control: none  Inpatient Diabetes Program Recommendations:    If patient to be admitted, consider adding Novolog 0-15 units TID and Novolog 0-5 units QHS.   Thanks, Bronson Curb, MSN, RNC-OB Diabetes Coordinator [phone] (8a-5p)

## 2020-07-10 NOTE — Telephone Encounter (Signed)
    Attention TOC pool,  This patient will need a TOC phone call after discharge. They are being discharged today. Follow-up appointment has already been arranged with: Dr. Oval Linsey 8/25 They are a patient of Skeet Latch, MD.  Thank you! Charlie Pitter, PA-C

## 2020-07-10 NOTE — Discharge Instructions (Signed)
Radial Site Care  This sheet gives you information about how to care for yourself after your procedure. Your health care provider may also give you more specific instructions. If you have problems or questions, contact your health care provider. What can I expect after the procedure? After the procedure, it is common to have:  Bruising and tenderness at the catheter insertion area. Follow these instructions at home: Medicines  Take over-the-counter and prescription medicines only as told by your health care provider. Insertion site care  Follow instructions from your health care provider about how to take care of your insertion site. Make sure you: ? Wash your hands with soap and water before you change your bandage (dressing). If soap and water are not available, use hand sanitizer. ? Change your dressing as told by your health care provider. ? Leave stitches (sutures), skin glue, or adhesive strips in place. These skin closures may need to stay in place for 2 weeks or longer. If adhesive strip edges start to loosen and curl up, you may trim the loose edges. Do not remove adhesive strips completely unless your health care provider tells you to do that.  Check your insertion site every day for signs of infection. Check for: ? Redness, swelling, or pain. ? Fluid or blood. ? Pus or a bad smell. ? Warmth.  Do not take baths, swim, or use a hot tub until your health care provider approves.  You may shower 24-48 hours after the procedure, or as directed by your health care provider. ? Remove the dressing and gently wash the site with plain soap and water. ? Pat the area dry with a clean towel. ? Do not rub the site. That could cause bleeding.  Do not apply powder or lotion to the site. Activity   For 24 hours after the procedure, or as directed by your health care provider: ? Do not flex or bend the affected arm. ? Do not push or pull heavy objects with the affected arm. ? Do not  drive for 3 days (do not return to driving if you have previously been instructed to avoid this completely).  ? Do not operate machinery or power tools.  Do not lift anything that is heavier than 10 lb (4.5 kg), or the limit that you are told, until your health care provider says that it is safe.  Ask your health care provider when it is okay to: ? Return to work or school. ? Resume usual physical activities or sports. ? Resume sexual activity. General instructions  If the catheter site starts to bleed, raise your arm and put firm pressure on the site. If the bleeding does not stop, get help right away. This is a medical emergency.  If you went home on the same day as your procedure, a responsible adult should be with you for the first 24 hours after you arrive home.  Keep all follow-up visits as told by your health care provider. This is important. Contact a health care provider if:  You have a fever.  You have redness, swelling, or yellow drainage around your insertion site. Get help right away if:  You have unusual pain at the radial site.  The catheter insertion area swells very fast.  The insertion area is bleeding, and the bleeding does not stop when you hold steady pressure on the area.  Your arm or hand becomes pale, cool, tingly, or numb. These symptoms may represent a serious problem that is an emergency. Do  not wait to see if the symptoms will go away. Get medical help right away. Call your local emergency services (911 in the [place] Do not drive yourself to the hospital. Summary  After the procedure, it is common to have bruising and tenderness at the site.  Follow instructions from your health care provider about how to take care of your radial site wound. Check the wound every day for signs of infection.  Do not lift anything that is heavier than 10 lb (4.5 kg), or the limit that you are told, until your health care provider says that it is safe. This information  is not intended to replace advice given to you by your health care provider. Make sure you discuss any questions you have with your health care provider. Document Revised: 12/14/2017 Document Reviewed: 12/14/2017 Elsevier Patient Education  2020 [employer].

## 2020-07-11 ENCOUNTER — Ambulatory Visit (INDEPENDENT_AMBULATORY_CARE_PROVIDER_SITE_OTHER): Payer: Medicare HMO | Admitting: Pulmonary Disease

## 2020-07-11 ENCOUNTER — Encounter (HOSPITAL_COMMUNITY): Payer: Self-pay | Admitting: Cardiology

## 2020-07-11 VITALS — BP 110/62 | HR 86 | Temp 95.3°F | Ht 71.0 in | Wt 210.0 lb

## 2020-07-11 DIAGNOSIS — R0602 Shortness of breath: Secondary | ICD-10-CM | POA: Diagnosis not present

## 2020-07-11 NOTE — Patient Instructions (Signed)
Obtain pulmonary function test  Graded exercise as tolerated when you get back to being able to exercise following cardiology clearance  Follow-up appointment in 3 months  Continue to watch symptoms closely

## 2020-07-11 NOTE — Telephone Encounter (Signed)
Mailbox full unable to leave message Will try later ./cy

## 2020-07-11 NOTE — Progress Notes (Signed)
[name]    [id number]    [name]  Primary Care Physician:Burns, Claudina Lick, MD  Referring Physician: Marrian Walker, [place] [name],  [place] [id number]  Chief complaint:   Shortness of breath  HPI:  Patient is s/p cardiac catheterization for which she had stents placed for two-vessel coronary artery disease  Since his cardiac catheterization he is feeling a lot better  He was having some chest discomfort, shortness of breath, wheezing  Past history of smoking Past history of exposure to asbestos  Worked in Event organiser  Symptoms feel a lot better since his cardiac catheterization and intervention  Has an appointment to follow-up with cardiology  He was doing relatively well in the past without significant breathing issues until he started Trulicity  Smoked up to 2 packs a day in the past, quit in 1984 Was not labeled with any lung disease at any point in time  Past history of a stroke in 2013, left-sided weakness  Outpatient Encounter Medications as of 07/11/2020  Medication Sig  . aspirin EC 81 MG tablet Take 1 tablet (81 mg total) by mouth daily. Swallow whole.  Marland Kitchen atorvastatin (LIPITOR) 40 MG tablet Take 1 tablet (40 mg total) by mouth daily.  . Blood Glucose Monitoring Suppl (ONETOUCH VERIO) w/Device KIT USE METER TO CHECK GLUCOSE ONCE DAILY AS DIRECTED  . calcium carbonate (TUMS - DOSED IN MG ELEMENTAL CALCIUM) 500 MG chewable tablet Chew 1 tablet by mouth daily as needed for indigestion or heartburn.  . carvedilol (COREG) 12.5 MG tablet TAKE 1 & 1/2 (ONE & ONE-HALF) TABLETS BY MOUTH TWICE DAILY (Patient taking differently: Take 18.75 mg by mouth 2 (two) times daily with a meal. )  . clopidogrel (PLAVIX) 75 MG tablet Take 1 tablet (75 mg total) by mouth daily. Please schedule annual appt in April for refills. Thank you  . Dulaglutide (TRULICITY) 8.65 HQ/4.6NG SOPN Inject 0.5 mLs (0.75 mg total) into the skin once a week.   Marland Kitchen glucose blood (ONETOUCH VERIO) test strip 1 each by Other route 2 (two) times daily. And lancets 2/day.  . Insulin Lispro Prot & Lispro (HUMALOG MIX 50/50 KWIKPEN) (50-50) 100 UNIT/ML Kwikpen Inject 32 Units into the skin daily with breakfast.  . nitroGLYCERIN (NITROSTAT) 0.4 MG SL tablet Place 1 tablet (0.4 mg total) under the tongue every 5 (five) minutes as needed for chest pain (up to 3 doses).  [name] Delica Lancets 29B MISC 1 each by Does not apply route 2 (two) times daily. Use to monitor glucose levels BID; E11.9  . RELION PEN NEEDLES 31G X 6 MM MISC USE 1 PEN NEEDLE ONCE DAILY  . RELION PEN NEEDLES 32G X 4 MM MISC USE 1 ONCE DAILY AS DIRECTED  . senna-docusate (SENOKOT-S) 8.6-50 MG per tablet Take 2 tablets by mouth 2 (two) times daily. For constipation.  . simethicone (MYLICON) 284 MG chewable tablet Chew 125 mg by mouth every 6 (six) hours as needed for flatulence.   No facility-administered encounter medications on file as of 07/11/2020.    Allergies as of 07/11/2020 - Review Complete 07/11/2020  Allergen Reaction Noted  . Penicillins Anaphylaxis   . Pneumococcal vaccines Anaphylaxis 04/07/2012  . Shellfish allergy Anaphylaxis 04/07/2012  . Influenza vaccines  09/09/2014  . Lisinopril Cough 08/20/2012  . Topiramate Other (See Comments) 10/10/2012  . Amlodipine Other (See Comments) 11/20/2018    Past Medical History:  Diagnosis Date  . ALLERGIC RHINITIS   .  CAD, NATIVE VESSEL    BMS to OM1 2001, DES to BMS 2005  . Chronic back pain    herniated disc  . Chronic combined systolic and diastolic heart failure ([place]) 06/29/2016  . Constipation    takes Carafate four times day  . DIAB W/UNSPEC COMP TYPE II/UNSPEC TYPE UNCNTRL   . ERECTILE DYSFUNCTION   . GERD   . HYPERLIPIDEMIA-MIXED   . HYPERTENSION, BENIGN   . LBBB (left bundle branch block) 02/02/2016  . MORTON'S NEUROMA, RIGHT   . Peripheral neuropathy   . Seasonal allergies    takes Allegra and Benadryl daily  prn;uses Flonase daily  . SHOULDER PAIN, RIGHT   . Stroke, thrombotic ([place]) 07/2012   L HP + hemiparesis, s/p CIR   . TIA on medication 02/2012  . Unstable angina ([place]) 07/09/2020    Past Surgical History:  Procedure Laterality Date  . ANGIOPLASTY    . COLONOSCOPY    . CORONARY ANGIOPLASTY  2005   2 stents  . CORONARY STENT INTERVENTION N/A 07/10/2020   Procedure: CORONARY STENT INTERVENTION;  Surgeon: Martinique, Peter M, MD;  Location: [facility];  Service: Cardiovascular;  Laterality: N/A;  . DENTAL SURGERY    . LARYNGOPLASTY  08/07/2012   Procedure: LARYNGOPLASTY;  Surgeon: Izora Gala, MD;  Location: [facility];  Service: ENT;  Laterality: Left;  Left Vocal Cord Medialyzation  . LEFT HEART CATH AND CORONARY ANGIOGRAPHY N/A 07/10/2020   Procedure: LEFT HEART CATH AND CORONARY ANGIOGRAPHY;  Surgeon: Martinique, Peter M, MD;  Location: [facility];  Service: Cardiovascular;  Laterality: N/A;  . left knee surgury     x 2   . stent  2001, 2004   coronary stents    Family History  Problem Relation Age of Onset  . Cancer Mother     Social History   Socioeconomic History  . Marital status: Legally Separated    Spouse name: Not on file  . Number of children: 3  . Years of education: college  . Highest education level: Not on file  Occupational History  . Occupation: disabled    Employer: [employer]  Tobacco Use  . Smoking status: Former Smoker    Packs/day: 2.50    Years: 9.00    Pack years: 22.50    Types: Cigarettes    Quit date: 02/24/1983    Years since quitting: 37.4  . Smokeless tobacco: Never Used  Vaping Use  . Vaping Use: Never used  Substance and Sexual Activity  . Alcohol use: No    Alcohol/week: 0.0 standard drinks  . Drug use: No  . Sexual activity: Never  Other Topics Concern  . Not on file  Social History Narrative      Social Determinants of Health   Financial Resource Strain: Low Risk   . Difficulty of Paying Living Expenses: Not very hard   Food Insecurity:   . Worried About Charity fundraiser in the Last Year: Not on file  . Ran Out of Food in the Last Year: Not on file  Transportation Needs:   . Lack of Transportation (Medical): Not on file  . Lack of Transportation (Non-Medical): Not on file  Physical Activity: Sufficiently Active  . Days of Exercise per Week: 3 days  . Minutes of Exercise per Session: 60 min  Stress:   . Feeling of Stress : Not on file  Social Connections:   . Frequency of Communication with Friends and Family: Not on file  .  Frequency of Social Gatherings with Friends and Family: Not on file  . Attends Religious Services: Not on file  . Active Member of Clubs or Organizations: Not on file  . Attends Archivist Meetings: Not on file  . Marital Status: Not on file  Intimate Partner Violence:   . Fear of Current or Ex-Partner: Not on file  . Emotionally Abused: Not on file  . Physically Abused: Not on file  . Sexually Abused: Not on file    Review of Systems  Constitutional: Negative for fatigue.  Respiratory: Positive for shortness of breath.   Cardiovascular: Negative.   Musculoskeletal: Negative.     Vitals:   07/11/20 1123  BP: 110/62  Pulse: 86  Temp: (!) 95.3 F (35.2 C)  SpO2: 100%     Physical Exam Constitutional:      Appearance: Normal appearance.  HENT:     Head: Normocephalic.     Nose: No congestion.     Mouth/Throat:     Mouth: Mucous membranes are moist.  Cardiovascular:     Rate and Rhythm: Normal rate and regular rhythm.     Pulses: Normal pulses.     Heart sounds: Normal heart sounds. No murmur heard.  No friction rub.  Pulmonary:     Effort: Pulmonary effort is normal. No respiratory distress.     Breath sounds: Normal breath sounds. No stridor. No wheezing or rhonchi.  Musculoskeletal:     Cervical back: No rigidity or tenderness.  Skin:    General: Skin is warm.  Neurological:     General: No focal deficit present.     Mental Status: He  is alert.    Chest x-ray: 07/07/2020 with no acute infiltrate, reviewed by myself  Cardiac catheterization report noted-reviewed  Assessment:  Shortness of breath -Improved since his cardiac catheterization  Past history of smoking  Past history of asbestos exposure  It is appropriate to watch his symptoms closely as he is already feeling much better following his cardiac catheterization and intervention  Plan/Recommendations: We will obtain a pulmonary function test  Graded exercise as tolerated once he is able to go back to physical activity  Encouraged to call with any significant concerns  Follow-up in 3 months   Sherrilyn Rist MD [facility] 07/11/2020, 12:01 PM  CC: [name],*

## 2020-07-13 ENCOUNTER — Other Ambulatory Visit: Payer: Self-pay | Admitting: Endocrinology

## 2020-07-15 NOTE — Telephone Encounter (Signed)
Patient contacted regarding discharge from [facility] on 07/10/20.  Patient understands to follow up with Dr.Rader Creek 8/25 at 1:20 pm. Patient understands discharge instructions. Patient understands medications and regiment. Patient understands to bring all medications to this visit.

## 2020-07-16 ENCOUNTER — Ambulatory Visit (INDEPENDENT_AMBULATORY_CARE_PROVIDER_SITE_OTHER): Payer: Medicare HMO | Admitting: Cardiovascular Disease

## 2020-07-16 ENCOUNTER — Other Ambulatory Visit: Payer: Self-pay

## 2020-07-16 ENCOUNTER — Telehealth (HOSPITAL_COMMUNITY): Payer: Self-pay

## 2020-07-16 ENCOUNTER — Encounter: Payer: Self-pay | Admitting: Cardiovascular Disease

## 2020-07-16 VITALS — BP 118/70 | HR 80 | Ht 71.0 in | Wt 216.8 lb

## 2020-07-16 DIAGNOSIS — I10 Essential (primary) hypertension: Secondary | ICD-10-CM

## 2020-07-16 DIAGNOSIS — I5042 Chronic combined systolic (congestive) and diastolic (congestive) heart failure: Secondary | ICD-10-CM | POA: Diagnosis not present

## 2020-07-16 DIAGNOSIS — I2510 Atherosclerotic heart disease of native coronary artery without angina pectoris: Secondary | ICD-10-CM

## 2020-07-16 DIAGNOSIS — Z9861 Coronary angioplasty status: Secondary | ICD-10-CM | POA: Diagnosis not present

## 2020-07-16 DIAGNOSIS — I6330 Cerebral infarction due to thrombosis of unspecified cerebral artery: Secondary | ICD-10-CM

## 2020-07-16 NOTE — Progress Notes (Signed)
Cardiology Office Note  Date:  07/16/2020   ID:  [name], DOB [birth year], MRN [id number]  PCP:  Binnie Rail, MD  Cardiologist:   Skeet Latch, MD   No chief complaint on file.    History of Present Illness: [name] is a 69 y.o. male with hypertension, CAD status post multiple PCI, stroke, CVA, diabetes mellitus type 2, hyperlipidemia, and prior tobacco abuse who presents for follow up.  [name] was referred by Dr. Billey Gosling on 02/09/16. [name] and PCI with a BMS to OM while living in [place].  He had in-stent restenosis in 2005 and had a DES placed.   At that time he reported occasional episodes of chest pain relieved by aspirin.  He denied exertional chest pain but did note dizziness and nausea.  Additionally, he was noted to have a left bundle branch block that was not present on his last EKG in 2013. Therefore, he was referred for exercise Myoview 02/13/16 that revealed LVEF 39% with global hypokinesis but no ischemia.  Echo 03/01/16 revealed LV 40-45% with mild LVH and grade 2 diastolic dysfunction. There was also mild hypokinesis of the mid to apical anteroseptum.   He works with our pharmacists and has achieved very good blood pressure control.  He is in a program at the [place] that checks his BP daily.    [name] seen in the ED 06/2020 with a complaint of 2 months of chest pain, abdominal pain, back pain, and gas.  High-sensitivity troponin was elevated at 32.  EKG showed sinus rhythm with a left bundle branch block, which is chronic.  He left prior to receiving a follow-up lab because of COVID-19 related back up in the ED.  He followed up in the office a couple days later and high-sensitivity troponin was 124.  He was direct admitted to the hospital where he underwent cardiac catheterization.  His cath 8/19 revealed 95% stenosis of the ostial OM 2 just proximal to his prior OM stent.  A drug-eluting stent was successfully placed.  He is feeling much better.   He no longer has any chest discomfort.  His breathing is stable.  He denies lower extremity edema, orthopnea, or PND.  He continues to be intolerant of Trulicity.   Past Medical History:  Diagnosis Date  . ALLERGIC RHINITIS   . CAD, NATIVE VESSEL    BMS to OM1 2001, DES to BMS 2005  . Chronic back pain    herniated disc  . Chronic combined systolic and diastolic heart failure ([place]) 06/29/2016  . Constipation    takes Carafate four times day  . DIAB W/UNSPEC COMP TYPE II/UNSPEC TYPE UNCNTRL   . ERECTILE DYSFUNCTION   . GERD   . HYPERLIPIDEMIA-MIXED   . HYPERTENSION, BENIGN   . LBBB (left bundle branch block) 02/02/2016  . MORTON'S NEUROMA, RIGHT   . Peripheral neuropathy   . Seasonal allergies    takes Allegra and Benadryl daily prn;uses Flonase daily  . SHOULDER PAIN, RIGHT   . Stroke, thrombotic ([place]) 07/2012   L HP + hemiparesis, s/p CIR   . TIA on medication 02/2012  . Unstable angina ([place]) 07/09/2020    Past Surgical History:  Procedure Laterality Date  . ANGIOPLASTY    . COLONOSCOPY    . CORONARY ANGIOPLASTY  2005   2 stents  . CORONARY STENT INTERVENTION N/A 07/10/2020   Procedure: CORONARY STENT INTERVENTION;  Surgeon: Martinique, Peter M, MD;  Location: [facility];  Service: Cardiovascular;  Laterality: N/A;  . DENTAL SURGERY    . LARYNGOPLASTY  08/07/2012   Procedure: LARYNGOPLASTY;  Surgeon: Izora Gala, MD;  Location: [facility];  Service: ENT;  Laterality: Left;  Left Vocal Cord Medialyzation  . LEFT HEART CATH AND CORONARY ANGIOGRAPHY N/A 07/10/2020   Procedure: LEFT HEART CATH AND CORONARY ANGIOGRAPHY;  Surgeon: Martinique, Peter M, MD;  Location: [facility];  Service: Cardiovascular;  Laterality: N/A;  . left knee surgury     x 2   . stent  2001, 2004   coronary stents     Current Outpatient Medications  Medication Sig Dispense Refill  . aspirin EC 81 MG tablet Take 1 tablet (81 mg total) by mouth daily. Swallow whole. 30 tablet 11  . atorvastatin  (LIPITOR) 40 MG tablet Take 1 tablet (40 mg total) by mouth daily. 90 tablet 2  . Blood Glucose Monitoring Suppl (ONETOUCH VERIO) w/Device KIT USE METER TO CHECK GLUCOSE ONCE DAILY AS DIRECTED    . calcium carbonate (TUMS - DOSED IN MG ELEMENTAL CALCIUM) 500 MG chewable tablet Chew 1 tablet by mouth daily as needed for indigestion or heartburn.    . carvedilol (COREG) 12.5 MG tablet TAKE 1 & 1/2 (ONE & ONE-HALF) TABLETS BY MOUTH TWICE DAILY (Patient taking differently: Take 18.75 mg by mouth 2 (two) times daily with a meal. ) 270 tablet 3  . clopidogrel (PLAVIX) 75 MG tablet Take 1 tablet (75 mg total) by mouth daily. Please schedule annual appt in April for refills. Thank you 90 tablet 3  . Dulaglutide (TRULICITY) 3.53 IR/4.4RX SOPN Inject 0.5 mLs (0.75 mg total) into the skin once a week. 6 mL 3  . glucose blood (ONETOUCH VERIO) test strip 1 each by Other route 2 (two) times daily. And lancets 2/day. 200 each 3  . Insulin Lispro Prot & Lispro (HUMALOG MIX 50/50 KWIKPEN) (50-50) 100 UNIT/ML Kwikpen Inject 32 Units into the skin daily with breakfast.    . nitroGLYCERIN (NITROSTAT) 0.4 MG SL tablet Place 1 tablet (0.4 mg total) under the tongue every 5 (five) minutes as needed for chest pain (up to 3 doses). 25 tablet 3  . OneTouch Delica Lancets 54M MISC 1 each by Does not apply route 2 (two) times daily. Use to monitor glucose levels BID; E11.9 100 each 3  . RELION PEN NEEDLES 31G X 6 MM MISC USE 1 PEN NEEDLE ONCE DAILY 50 each 0  . RELION PEN NEEDLES 32G X 4 MM MISC USE 1 ONCE DAILY AS DIRECTED  11  . senna-docusate (SENOKOT-S) 8.6-50 MG per tablet Take 2 tablets by mouth 2 (two) times daily. For constipation.    . simethicone (MYLICON) 086 MG chewable tablet Chew 125 mg by mouth every 6 (six) hours as needed for flatulence.     No current facility-administered medications for this visit.    Allergies:   Penicillins, Pneumococcal vaccines, Shellfish allergy, Influenza vaccines, Lisinopril,  Topiramate, and Amlodipine    Social History:  The patient  reports that he quit smoking about 37 years ago. His smoking use included cigarettes. He has a 22.50 pack-year smoking history. He has never used smokeless tobacco. He reports that he does not drink alcohol and does not use drugs.   Family History:  The patient's family history includes Cancer in his mother.    ROS:  Please see the history of present illness.   Otherwise, review of systems are positive for L side weakness since his  stroke and cough.  All other systems are reviewed and negative.    PHYSICAL EXAM: VS:  BP 118/70   Pulse 80   Ht _0  (1.803 m)   Wt 216 lb 12.8 oz (98.3 kg)   SpO2 96%   BMI 30.24 kg/m  , BMI Body mass index is 30.24 kg/m. GENERAL:  Well appearing HEENT: Pupils equal round and reactive, fundi not visualized, oral mucosa unremarkable NECK:  No jugular venous distention, waveform within normal limits, carotid upstroke brisk and symmetric, no bruits LUNGS:  Clear to auscultation bilaterally HEART:  RRR.  PMI not displaced or sustained,S1 and S2 within normal limits, no S3, no S4, no clicks, no rubs, no murmurs ABD:  Flat, positive bowel sounds normal in frequency in pitch, no bruits, no rebound, no guarding, no midline pulsatile mass, no hepatomegaly, no splenomegaly EXT:  2 plus pulses throughout, no edema, no cyanosis no clubbing SKIN:  No rashes no nodules NEURO:  Cranial nerves II through XII grossly intact, L sided weakness PSYCH:  Cognitively intact, oriented to person place and time  EKG:  EKG is ordered today. The ekg ordered 3//13/17 demonstrates sinus rhythm rate 64 bpm.  LBBB.   07/20/17: Sinus rhythm. Rate 74 bpm. Left bundle branch block. 09/01/18; Sinus rhythm.  Rate 71 bpm.  LBBB 07/09/20: Sinus rhythm.  Rate 91 bpm.  LBBB. 07/16/20: Sinus rhythm.  Rate 80 bpm.  LBBB.  LHC 07/10/20:   2nd Mrg-1 lesion is 95% stenosed.  2nd Mrg-2 lesion is 30% stenosed.  RPDA lesion is 100%  stenosed.  A drug-eluting stent was successfully placed using a STENT RESOLUTE ONYX 2.75X8.  Post intervention, there is a 0% residual stenosis.  Post intervention, there is a 0% residual stenosis.  LV end diastolic pressure is normal.   1. 2 vessel obstructive CAD    -95% ostial OM2. This was at the proximal margin of the old stents    - 100% distal PDA. This fills by left to right collaterals. 2. Normal LVEDP 3. Successful PCI of the ostium of the OM with DES x 1.  Plan: DAPT indefinitely given multiple stents in OM. Anticipate same day DC. Will hold losartan and HCTZ until renal function repeated as outpatient.    Echo 09/20/18: Study Conclusions   - Left ventricle: abnormal septal motion. The cavity size was  mildly dilated. Wall thickness was increased in a pattern of  severe LVH. Systolic function was mildly to moderately reduced.  The estimated ejection fraction was in the range of 40% to 45%.  Doppler parameters are consistent with abnormal left ventricular  relaxation (grade 1 diastolic dysfunction).  - Atrial septum: There was increased thickness of the septum,  consistent with lipomatous hypertrophy. No defect or patent  foramen ovale was identified.   Carotid Doppler 10/24/14: Normal study  Lexiscan Myoview 10/2018:  Nuclear stress EF: 43%. The left ventricular ejection fraction is moderately decreased (30-44%).  Defect 1: There is a medium defect of moderate severity present in the mid anteroseptal, mid inferoseptal, apical septal and apex location. This septal defect is likely due to the LBBB.  This is an intermediate risk study. This study is similar to the previous study in 2017.  ABI 08/2018: Normal bilaterally  Recent Labs: 10/17/2019: TSH 2.08 07/07/2020: ALT 28 07/10/2020: BUN 29; Creatinine, Ser 2.03; Hemoglobin 9.7; Platelets 190; Potassium 3.9; Sodium 137    Lipid Panel    Component Value Date/Time   CHOL 112 04/15/2020 1128    CHOL  164 07/20/2017 1202   TRIG 136.0 04/15/2020 1128   TRIG 200 04/20/2009 0000   HDL 44.60 04/15/2020 1128   HDL 55 07/20/2017 1202   CHOLHDL 3 04/15/2020 1128   VLDL 27.2 04/15/2020 1128   LDLCALC 40 04/15/2020 1128   LDLCALC 82 07/20/2017 1202   LDLDIRECT 63.0 01/12/2016 0931      Wt Readings from Last 3 Encounters:  07/16/20 216 lb 12.8 oz (98.3 kg)  07/11/20 210 lb (95.3 kg)  07/10/20 215 lb 2.7 oz (97.6 kg)      ASSESSMENT AND PLAN:  # CAD s/p PCI x2: # NSTEMI:  Mr. Mcclenton underwent successful PCI of OM 2.  He is feeling much better.  He will continue on indefinite dual antiplatelet therapy per Dr. Martinique.  Continue aspirin, clopidogrel, carvedilol, and atorvastatin.  # Chronic systolic and diastolic heart failure: Mr. Scarpelli is euvolemic.  LVEF was 40 to 45% previously.  Given his recent NSTEMI we will get a repeat echocardiogram.  Losartan was held due to chronic kidney disease and low blood pressure.  Continue carvedilol for now.  # Hypertension: BP is well-controlled.  At his last appointment chlorthalidone and losartan were held prior to cath.  His blood pressure is very well have been controlled today.  We will continue to keep these agents off.  I have asked him to check his blood pressure at home and bring to follow-up.  # Hyperlipidemia: LDL 40 on 03/2020.  Continue atorvastatin.   # CKD IV:  Stopped chlorthalidone and losartan as above.  He has been referred to nephrology.   Current medicines are reviewed at length with the patient today.  The patient does not have concerns regarding medicines.  The following changes have been made: No changes  Labs/ tests ordered today include:    Orders Placed This Encounter  Procedures  . EKG 12-Lead  . ECHOCARDIOGRAM COMPLETE     Disposition:   FU with Emaad Nanna C. Oval Linsey, MD, [facility] in 3 months     Signed, Jalonda Antigua C. Oval Linsey, MD, [facility]  07/16/2020 1:55 PM    [place]

## 2020-07-16 NOTE — Patient Instructions (Signed)
Medication Instructions:  Your physician recommends that you continue on your current medications as directed. Please refer to the Current Medication list given to you today.  *If you need a refill on your cardiac medications before your next appointment, please call your pharmacy*  Lab Work: NONE  Testing/Procedures: Your physician has requested that you have an echocardiogram. Echocardiography is a painless test that uses sound waves to create images of your heart. It provides your doctor with information about the size and shape of your heart and how well your hearts chambers and valves are working. This procedure takes approximately one hour. There are no restrictions for this procedure. [place] [address]   Follow-Up: At [facility], you and your health needs are our priority.  As part of our continuing mission to provide you with exceptional heart care, we have created designated Provider Care Teams.  These Care Teams include your primary Cardiologist (physician) and Advanced Practice Providers (APPs -  Physician Assistants and Nurse Practitioners) who all work together to provide you with the care you need, when you need it.  We recommend signing up for the patient portal called "MyChart".  Sign up information is provided on this After Visit Summary.  MyChart is used to connect with patients for Virtual Visits (Telemedicine).  Patients are able to view lab/test results, encounter notes, upcoming appointments, etc.  Non-urgent messages can be sent to your provider as well.   To learn more about what you can do with MyChart, go to [email or web address].    Your next appointment:   3 month(s)  The format for your next appointment:   In Person  Provider:   You may see Skeet Latch, MD or one of the following Advanced Practice Providers on your designated Care Team:    Kerin Ransom, PA-C  [place], [place]  Coletta Memos, [place]  Other  Instructions   MONITOR AND LOG YOUR BLOOD PRESSURE SEVERAL TIMES A WEEK, BRING READINGS TO YOUR FOLLOW UP

## 2020-07-16 NOTE — Telephone Encounter (Signed)
Pt insurance is active and benefits verified through [facility]. Co-pay $0.00, DED $0.00/$0.00 met, out of pocket $0.00/$0.00 met, co-insurance 0%. No pre-authorization required. [place], 07/16/20 @ 259PM, KCM#[id number]  Will contact patient to see if he is interested in the Cardiac Rehab Program. If interested, patient will need to complete follow up appt. Once completed, patient will be contacted for scheduling upon review by the RN Navigator.

## 2020-07-16 NOTE — Telephone Encounter (Signed)
Attempted to call patient in regards to Cardiac Rehab - mailbox full, unable to leave VM

## 2020-07-29 ENCOUNTER — Other Ambulatory Visit: Payer: Self-pay

## 2020-07-29 ENCOUNTER — Telehealth: Payer: Self-pay | Admitting: Internal Medicine

## 2020-07-29 ENCOUNTER — Ambulatory Visit (HOSPITAL_COMMUNITY): Payer: Medicare HMO | Attending: Cardiology

## 2020-07-29 DIAGNOSIS — Z9861 Coronary angioplasty status: Secondary | ICD-10-CM

## 2020-07-29 DIAGNOSIS — I2510 Atherosclerotic heart disease of native coronary artery without angina pectoris: Secondary | ICD-10-CM | POA: Diagnosis present

## 2020-07-29 DIAGNOSIS — I5042 Chronic combined systolic (congestive) and diastolic (congestive) heart failure: Secondary | ICD-10-CM | POA: Insufficient documentation

## 2020-07-29 DIAGNOSIS — I10 Essential (primary) hypertension: Secondary | ICD-10-CM | POA: Diagnosis not present

## 2020-07-29 LAB — ECHOCARDIOGRAM COMPLETE
Area-P 1/2: 2.62 cm2
S' Lateral: 3.6 cm

## 2020-07-29 NOTE — Telephone Encounter (Signed)
Called pt and spoke with him regarding this to gain clarification. Pt was originally on Trulicity 0.75mg  weekly through [facility]. It was increased to 1.5mg  weekly, and then changed back to 0.75mg  weekly. Pt contacted PAP this past Friday and they informed him that they did not have the newest dose change from 1.5mg  weekly back down to 0.75mg  weekly. Since I am not in the office today, could you please print out a [employer] form and complete the MD portion for the newest dosage, have Dr. Loanne Drilling sign, and then fax to [facility]?

## 2020-07-29 NOTE — Telephone Encounter (Signed)
Pt said he needs a new prescription for Trulicity. He said it needs to be called into the manufacture because he can get it cheaper this way. He said if you have any questions to call him.

## 2020-07-31 ENCOUNTER — Telehealth (HOSPITAL_COMMUNITY): Payer: Self-pay | Admitting: Internal Medicine

## 2020-07-31 ENCOUNTER — Telehealth (HOSPITAL_COMMUNITY): Payer: Self-pay

## 2020-07-31 NOTE — Telephone Encounter (Signed)
Called patient to see if he was interested in participating in the Cardiac Rehab Program. Patient stated yes. Patient will come in for orientation on 08/05/20 @ 130PM and will attend the 145PM exercise class. Went over insurance, patient verbalized understanding.   Mailed letter.

## 2020-08-01 ENCOUNTER — Encounter (HOSPITAL_COMMUNITY)
Admission: RE | Admit: 2020-08-01 | Discharge: 2020-08-01 | Disposition: A | Discharge status: Home / Self Care | Payer: Medicare HMO | Source: Ambulatory Visit | Attending: Cardiovascular Disease | Admitting: Cardiovascular Disease

## 2020-08-01 ENCOUNTER — Other Ambulatory Visit: Payer: Self-pay

## 2020-08-01 DIAGNOSIS — Z9861 Coronary angioplasty status: Secondary | ICD-10-CM | POA: Insufficient documentation

## 2020-08-01 DIAGNOSIS — I2510 Atherosclerotic heart disease of native coronary artery without angina pectoris: Secondary | ICD-10-CM

## 2020-08-01 NOTE — Progress Notes (Signed)
Cardiac Rehab Telephone Note:  Successful telephone encounter to [name] to confirm Cardiac Rehab orientation appointment for 08/05/20 at 1:30 pm. Nursing assessment completed. Patient questions answered. Instructions for appointment provided. Patient screening for Covid-19 negative.  Yaiza Palazzola E. Rollene Rotunda RN, BSN [place]. [facility]  Cardiac and Pulmonary Rehabilitation Phone: [phone] Fax: [phone]

## 2020-08-01 NOTE — Telephone Encounter (Signed)
It was discussed with Ammie.

## 2020-08-01 NOTE — Telephone Encounter (Signed)
[name], was this ever addressed?

## 2020-08-01 NOTE — Telephone Encounter (Signed)
PATIENT ASSISTANCE PROGRAM  PROVIDER REFILL Company: [employer]  Medication(s) ordered: Trulicity 0.75mg  Document: Rx refill  Above Rx refill has been faxed successfully to [employer] listed above. Document and fax confirmation has been placed in the faxed file for future reference.

## 2020-08-04 ENCOUNTER — Telehealth (HOSPITAL_COMMUNITY): Payer: Self-pay

## 2020-08-04 NOTE — Telephone Encounter (Signed)
Cardiac Rehab Medication Review by a Pharmacist  Does the patient  feel that his/her medications are working for him/her?  yes  Has the patient been experiencing any side effects to the medications prescribed?  no  Does the patient measure his/her own blood pressure or blood glucose at home?  Yes, checks blood pressure and blood glucose twice daily.  Says BP has been 160's.   Does the patient have any problems obtaining medications due to transportation or finances?   Overall, no, but his doctor did not send in the proper prescription for Trulicity. Encouraged patient to reach back out to his doctor for new presription.   Understanding of regimen: excellent Understanding of indications: excellent Potential of compliance: excellent   Pharmacist Intervention: n/a   Dimple Nanas, PharmD PGY-1 Acute Care Pharmacy Resident Office: [phone] 08/04/2020 11:25 AM

## 2020-08-05 ENCOUNTER — Encounter (HOSPITAL_COMMUNITY)
Admission: RE | Admit: 2020-08-05 | Discharge: 2020-08-05 | Disposition: A | Discharge status: Home / Self Care | Payer: Medicare HMO | Source: Ambulatory Visit | Attending: Cardiovascular Disease | Admitting: Cardiovascular Disease

## 2020-08-05 ENCOUNTER — Other Ambulatory Visit: Payer: Self-pay

## 2020-08-05 DIAGNOSIS — I2510 Atherosclerotic heart disease of native coronary artery without angina pectoris: Secondary | ICD-10-CM

## 2020-08-05 NOTE — Progress Notes (Signed)
Cardiac Rehab Note:  Mr. Jefferie Holston presented to his cardiac rehab orientation appointment today as scheduled. His first day of exercise was scheduled for 08/11/20 at 1:45 pm. Mr. Halteman states this exercise appointment time is not longer convenient for him as his daughter is going back to work and he will be watching her children. He request a later class time which will not be available until 09/08/20 at 3:15 pm. His orientation will be rescheduled for 09/02/20 at 1:30 pm.  Deaisa Merida E. Rollene Rotunda RN, BSN [place]. [facility]  Cardiac and Pulmonary Rehabilitation Phone: [phone] Fax: [phone]

## 2020-08-07 ENCOUNTER — Telehealth: Payer: Self-pay | Admitting: *Deleted

## 2020-08-07 NOTE — Telephone Encounter (Signed)
Called patient about his message. Patient stated that he was told to stop chlorthalidone and losartan before his heart cath back in August (07/10/20). Patient stated now he has pitting edema in his legs, ankles, and feet for about 2 weeks and his face is starting to swell as well. Will forward to Dr. Oval Linsey for advisement. Patient was suppose to be referred to nephrology, but has not seen anyone yet.

## 2020-08-07 NOTE — Telephone Encounter (Signed)
Patient replied will be at appointment tomorrow

## 2020-08-07 NOTE — Telephone Encounter (Signed)
  Tried to call patient to schedule appointment for tomorrow, voicemail full [employer] message with appointment time   Aris Georgia, Olin Hauser, RN 5 hours ago (1:07 PM)     Called patient about his message. Patient stated that he was told to stop chlorthalidone and losartan before his heart cath back in August (07/10/20). Patient stated now he has pitting edema in his legs, ankles, and feet for about 2 weeks and his face is starting to swell as well. Will forward to Dr. Oval Linsey for advisement. Patient was suppose to be referred to nephrology, but has not seen anyone yet.      Documentation   Franco Collet, MD 8 hours ago (10:23 AM)   Hi Dr. Oval Linsey  you instructed me to contact you if  I started to experience swelling, for approximately two weeks now my legs, feet and ankles have been swollen and for the pass several days my face has started to swell also. I m concern about these body changes please contact me as soon possible at [phone] concerning this matter.  Best regards

## 2020-08-08 ENCOUNTER — Other Ambulatory Visit: Payer: Self-pay

## 2020-08-08 ENCOUNTER — Ambulatory Visit (INDEPENDENT_AMBULATORY_CARE_PROVIDER_SITE_OTHER): Payer: Medicare HMO | Admitting: Cardiovascular Disease

## 2020-08-08 ENCOUNTER — Encounter: Payer: Self-pay | Admitting: Cardiovascular Disease

## 2020-08-08 ENCOUNTER — Ambulatory Visit (INDEPENDENT_AMBULATORY_CARE_PROVIDER_SITE_OTHER): Payer: Medicare HMO | Admitting: Endocrinology

## 2020-08-08 VITALS — BP 142/80 | HR 80 | Ht 71.0 in | Wt 222.0 lb

## 2020-08-08 VITALS — BP 150/95 | HR 74 | Temp 97.5°F | Ht 71.0 in | Wt 221.8 lb

## 2020-08-08 DIAGNOSIS — Z5181 Encounter for therapeutic drug level monitoring: Secondary | ICD-10-CM | POA: Diagnosis not present

## 2020-08-08 DIAGNOSIS — E1159 Type 2 diabetes mellitus with other circulatory complications: Secondary | ICD-10-CM

## 2020-08-08 DIAGNOSIS — I5042 Chronic combined systolic (congestive) and diastolic (congestive) heart failure: Secondary | ICD-10-CM | POA: Diagnosis not present

## 2020-08-08 DIAGNOSIS — Z794 Long term (current) use of insulin: Secondary | ICD-10-CM | POA: Diagnosis not present

## 2020-08-08 DIAGNOSIS — I200 Unstable angina: Secondary | ICD-10-CM | POA: Diagnosis not present

## 2020-08-08 DIAGNOSIS — G459 Transient cerebral ischemic attack, unspecified: Secondary | ICD-10-CM

## 2020-08-08 DIAGNOSIS — N189 Chronic kidney disease, unspecified: Secondary | ICD-10-CM

## 2020-08-08 DIAGNOSIS — IMO0002 Reserved for concepts with insufficient information to code with codable children: Secondary | ICD-10-CM

## 2020-08-08 DIAGNOSIS — E1165 Type 2 diabetes mellitus with hyperglycemia: Secondary | ICD-10-CM

## 2020-08-08 DIAGNOSIS — E1122 Type 2 diabetes mellitus with diabetic chronic kidney disease: Secondary | ICD-10-CM

## 2020-08-08 DIAGNOSIS — I10 Essential (primary) hypertension: Secondary | ICD-10-CM | POA: Diagnosis not present

## 2020-08-08 DIAGNOSIS — E1140 Type 2 diabetes mellitus with diabetic neuropathy, unspecified: Secondary | ICD-10-CM

## 2020-08-08 DIAGNOSIS — E119 Type 2 diabetes mellitus without complications: Secondary | ICD-10-CM

## 2020-08-08 LAB — POCT GLYCOSYLATED HEMOGLOBIN (HGB A1C): Hemoglobin A1C: 7.1 % — AB (ref 4.0–5.6)

## 2020-08-08 MED ORDER — FUROSEMIDE 40 MG PO TABS: 40.0000 mg | ORAL_TABLET | Freq: Every day | ORAL | 3 refills | Status: DC

## 2020-08-08 NOTE — Progress Notes (Signed)
Subjective:    Patient ID: [name], male    DOB: [birth year], 69 y.o.   MRN: [id number]  HPI Pt returns for f/u of diabetes mellitus:  DM type: Insulin-requiring type 2 Dx'ed: 0086 Complications: PN, CAD, CRI, DR, and TIA.   Therapy: insulin since 7619, and Trulicity.   DKA: never.   Severe hypoglycemia: last episode was early 2020.   Pancreatitis: never.  SDOH: he gets meds from mfgr--pt assistance.   Other: He says he cannot use syringe and vial, due to tremor; he changed lantus to NPH, then 70/30, then 50/50, due to pattern on cbg's; he declines multiple daily injections   Interval history: He says he never misses the meds.  no cbg record, but states cbg's vary from 99-150.  It is in general higher as the day goes on.  pt states he feels well in general.  No recent steroids.  He takes Trulicity 5.09 mg/week.  She says when he tried to increase, he got cough and sob.  Past Medical History:  Diagnosis Date  . ALLERGIC RHINITIS   . CAD, NATIVE VESSEL    BMS to OM1 2001, DES to BMS 2005  . Chronic back pain    herniated disc  . Chronic combined systolic and diastolic heart failure ([place]) 06/29/2016  . Constipation    takes Carafate four times day  . DIAB W/UNSPEC COMP TYPE II/UNSPEC TYPE UNCNTRL   . ERECTILE DYSFUNCTION   . GERD   . HYPERLIPIDEMIA-MIXED   . HYPERTENSION, BENIGN   . LBBB (left bundle branch block) 02/02/2016  . MORTON'S NEUROMA, RIGHT   . Peripheral neuropathy   . Seasonal allergies    takes Allegra and Benadryl daily prn;uses Flonase daily  . SHOULDER PAIN, RIGHT   . 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 TIA on medication 02/2012  . Unstable angina ([place]) 07/09/2020    Past Surgical History:  Procedure Laterality Date  . ANGIOPLASTY    . COLONOSCOPY    . CORONARY ANGIOPLASTY  2005   2 stents  . CORONARY STENT INTERVENTION N/A 07/10/2020   Procedure: CORONARY STENT INTERVENTION;  Surgeon: Martinique, Peter M, MD;  Location:  [facility];  Service: Cardiovascular;  Laterality: N/A;  . DENTAL SURGERY    . LARYNGOPLASTY  08/07/2012   Procedure: LARYNGOPLASTY;  Surgeon: Izora Gala, MD;  Location: [facility];  Service: ENT;  Laterality: Left;  Left Vocal Cord Medialyzation  . LEFT HEART CATH AND CORONARY ANGIOGRAPHY N/A 07/10/2020   Procedure: LEFT HEART CATH AND CORONARY ANGIOGRAPHY;  Surgeon: Martinique, Peter M, MD;  Location: [facility];  Service: Cardiovascular;  Laterality: N/A;  . left knee surgury     x 2   . stent  2001, 2004   coronary stents    Social History   Socioeconomic History  . Marital status: Legally Separated    Spouse name: Not on file  . Number of children: 3  . Years of education: college  . Highest education level: Not on file  Occupational History  . Occupation: disabled    Employer: [employer]  Tobacco Use  . Smoking status: Former Smoker    Packs/day: 2.50    Years: 9.00    Pack years: 22.50    Types: Cigarettes    Quit date: 02/24/1983    Years since quitting: 37.4  . Smokeless tobacco: Never Used  Vaping Use  . Vaping Use: Never used  Substance  and Sexual Activity  . Alcohol use: No    Alcohol/week: 0.0 standard drinks  . Drug use: No  . Sexual activity: Never  Other Topics Concern  . Not on file  Social History Narrative      Social Determinants of Health   Financial Resource Strain: Low Risk   . Difficulty of Paying Living Expenses: Not very hard  Food Insecurity:   . Worried About Charity fundraiser in the Last Year: Not on file  . Ran Out of Food in the Last Year: Not on file  Transportation Needs:   . Lack of Transportation (Medical): Not on file  . Lack of Transportation (Non-Medical): Not on file  Physical Activity: Sufficiently Active  . Days of Exercise per Week: 3 days  . Minutes of Exercise per Session: 60 min  Stress:   . Feeling of Stress : Not on file  Social Connections:   . Frequency of Communication with Friends and Family: Not on  file  . Frequency of Social Gatherings with Friends and Family: Not on file  . Attends Religious Services: Not on file  . Active Member of Clubs or Organizations: Not on file  . Attends Archivist Meetings: Not on file  . Marital Status: Not on file  Intimate Partner Violence:   . Fear of Current or Ex-Partner: Not on file  . Emotionally Abused: Not on file  . Physically Abused: Not on file  . Sexually Abused: Not on file    Current Outpatient Medications on File Prior to Visit  Medication Sig Dispense Refill  . aspirin EC 81 MG tablet Take 1 tablet (81 mg total) by mouth daily. Swallow whole. 30 tablet 11  . atorvastatin (LIPITOR) 40 MG tablet Take 1 tablet (40 mg total) by mouth daily. 90 tablet 2  . Blood Glucose Monitoring Suppl (ONETOUCH VERIO) w/Device KIT USE METER TO CHECK GLUCOSE ONCE DAILY AS DIRECTED    . calcium carbonate (TUMS - DOSED IN MG ELEMENTAL CALCIUM) 500 MG chewable tablet Chew 1 tablet by mouth daily as needed for indigestion or heartburn.    . carvedilol (COREG) 12.5 MG tablet TAKE 1 & 1/2 (ONE & ONE-HALF) TABLETS BY MOUTH TWICE DAILY (Patient taking differently: Take 18.75 mg by mouth 2 (two) times daily with a meal. ) 270 tablet 3  . clopidogrel (PLAVIX) 75 MG tablet Take 1 tablet (75 mg total) by mouth daily. Please schedule annual appt in April for refills. Thank you 90 tablet 3  . Dulaglutide (TRULICITY) 4.08 XK/4.8JE SOPN Inject 0.5 mLs (0.75 mg total) into the skin once a week. 6 mL 3  . glucose blood (ONETOUCH VERIO) test strip 1 each by Other route 2 (two) times daily. And lancets 2/day. 200 each 3  . Insulin Lispro Prot & Lispro (HUMALOG MIX 50/50 KWIKPEN) (50-50) 100 UNIT/ML Kwikpen Inject 32 Units into the skin daily with breakfast.    . nitroGLYCERIN (NITROSTAT) 0.4 MG SL tablet Place 1 tablet (0.4 mg total) under the tongue every 5 (five) minutes as needed for chest pain (up to 3 doses). 25 tablet 3  . OneTouch Delica Lancets 56D MISC 1 each by  Does not apply route 2 (two) times daily. Use to monitor glucose levels BID; E11.9 100 each 3  . RELION PEN NEEDLES 31G X 6 MM MISC USE 1 PEN NEEDLE ONCE DAILY 50 each 0  . RELION PEN NEEDLES 32G X 4 MM MISC USE 1 ONCE DAILY AS DIRECTED  11  .  senna-docusate (SENOKOT-S) 8.6-50 MG per tablet Take 2 tablets by mouth 2 (two) times daily. For constipation.    . simethicone (MYLICON) 962 MG chewable tablet Chew 125 mg by mouth every 6 (six) hours as needed for flatulence.     No current facility-administered medications on file prior to visit.    Allergies  Allergen Reactions  . Penicillins Anaphylaxis  . Pneumococcal Vaccines Anaphylaxis  . Shellfish Allergy Anaphylaxis  . Influenza Vaccines   . Lisinopril Cough  . Topiramate Other (See Comments)    Chest spasms and numbness  . Amlodipine Other (See Comments)    LE edema    Family History  Problem Relation Age of Onset  . Cancer Mother     BP (!) 142/80   Pulse 80   Ht 5' 11"  (1.803 m)   Wt 222 lb (100.7 kg)   SpO2 97%   BMI 30.96 kg/m    Review of Systems He denies hypoglycemia.     Objective:   Physical Exam VITAL SIGNS:  See vs page.   GENERAL: no distress Pulses: dorsalis pedis intact bilat.   MSK: no deformity of the feet CV: trace bilat leg edema Skin:  no ulcer on the feet.  normal color and temp on the feet. Neuro: sensation is intact to touch on the feet, but decreased from normal   Lab Results  Component Value Date   CREATININE 2.03 (H) 07/10/2020   BUN 29 (H) 07/10/2020   NA 137 07/10/2020   K 3.9 07/10/2020   CL 103 07/10/2020   CO2 24 07/10/2020   Lab Results  Component Value Date   HGBA1C 7.1 (A) 08/08/2020       Assessment & Plan:  Insulin-requiring type 2 DM, with CAD CRI:  Advised him to increase Trulicity, and decrease insulin, but he declines   HTN: is noted today  Patient Instructions  Your blood pressure is high today.  Please see your primary care provider soon, to have it  rechecked Please continue the same medications On this type of insulin schedule, you should eat meals on a regular schedule (especially breakfast and lunch).  If a meal is missed or significantly delayed, your blood sugar could go low.   Please come back for a follow-up appointment in 2-3 months.   check your blood sugar twice a day.  vary the time of day when you check, between before the 3 meals, and at bedtime.  also check if you have symptoms of your blood sugar being too high or too low.  please keep a record of the readings and bring it to your next appointment here (or you can bring the meter itself).  You can write it on any piece of paper.  please call us sooner if your blood sugar goes below 70, or if you have a lot of readings over 200.

## 2020-08-08 NOTE — Patient Instructions (Signed)
Medication Instructions:  START LASIX 40 MG DAILY   START LOSARTAN 100 MG DAILY   *If you need a refill on your cardiac medications before your next appointment, please call your pharmacy*  Lab Work: BMET IN 1 WEEK   If you have labs (blood work) drawn today and your tests are completely normal, you will receive your results only by: Marland Kitchen MyChart Message (if you have MyChart) OR . A paper copy in the mail If you have any lab test that is abnormal or we need to change your treatment, we will call you to review the results.  Testing/Procedures: NONE  Follow-Up: At [facility], you and your health needs are our priority.  As part of our continuing mission to provide you with exceptional heart care, we have created designated Provider Care Teams.  These Care Teams include your primary Cardiologist (physician) and Advanced Practice Providers (APPs -  Physician Assistants and Nurse Practitioners) who all work together to provide you with the care you need, when you need it.  We recommend signing up for the patient portal called "MyChart".  Sign up information is provided on this After Visit Summary.  MyChart is used to connect with patients for Virtual Visits (Telemedicine).  Patients are able to view lab/test results, encounter notes, upcoming appointments, etc.  Non-urgent messages can be sent to your provider as well.   To learn more about what you can do with MyChart, go to [email or web address].    Your next appointment:   4 week(s)  The format for your next appointment:   Virtual Visit   Provider:   You may see Skeet Latch, MD or one of the following Advanced Practice Providers on your designated Care Team:    Kerin Ransom, PA-C  [place], [place]  Coletta Memos, [place]  Other Instructions MONITOR AND LOG YOUR BLOOD PRESSURE DAILY AT [place]

## 2020-08-08 NOTE — Progress Notes (Signed)
Cardiology Office Note  Date:  08/08/2020   ID:  [name], DOB [birth year], MRN [id number]  PCP:  Binnie Rail, MD  Cardiologist:   Skeet Latch, MD   No chief complaint on file.    History of Present Illness: [name] is a 69 y.o. male with hypertension, CAD status post multiple PCI, stroke, CVA, diabetes mellitus type 2, hyperlipidemia, and prior tobacco abuse who presents for follow up.  [name] was referred by Dr. Billey Gosling on 02/09/16. [name] and PCI with a BMS to OM while living in [place].  He had in-stent restenosis in 2005 and had a DES placed.   At that time he reported occasional episodes of chest pain relieved by aspirin.  He denied exertional chest pain but did note dizziness and nausea.  Additionally, he was noted to have a left bundle branch block that was not present on his last EKG in 2013. Therefore, he was referred for exercise Myoview 02/13/16 that revealed LVEF 39% with global hypokinesis but no ischemia.  Echo 03/01/16 revealed LV 40-45% with mild LVH and grade 2 diastolic dysfunction. There was also mild hypokinesis of the mid to apical anteroseptum.   He works with our pharmacists and has achieved very good blood pressure control.  He is in a program at the [place] that checks his BP daily.    [name] seen in the ED 06/2020 with a complaint of two months of chest pain, abdominal pain, back pain, and gas.  High-sensitivity troponin was elevated at 32.  EKG showed sinus rhythm with a left bundle branch block, which is chronic.  He left prior to receiving a follow-up lab because of COVID-19 related back up in the ED.  He followed up in the office a couple days later and high-sensitivity troponin was 124.  He was direct admitted to the hospital where he underwent cardiac catheterization.  His cath 8/19 revealed 95% stenosis of the ostial OM 2 just proximal to his prior OM stent.  A drug-eluting stent was successfully placed.  In follow-up he continued  to be much better.  Chlorthalidone and losartan were held in the setting of his cath and his blood pressure was stable at follow-up so they were not resumed.  However lately his blood pressure has been more elevated and he has increasing shortness of breath and edema.  He also notes that his face has been puffy.  He has no chest pain or pressure.  He does notes that he is more short of breath when walking and sometimes when laying down.  He had a repeat echo 07/2020 that revealed an improvement in his LVEF to 55% with grade 1 diastolic dysfunction.  Right atrial pressure was 3 mmHg.  Past Medical History:  Diagnosis Date  . ALLERGIC RHINITIS   . CAD, NATIVE VESSEL    BMS to OM1 2001, DES to BMS 2005  . Chronic back pain    herniated disc  . Chronic combined systolic and diastolic heart failure ([place]) 06/29/2016  . Constipation    takes Carafate four times day  . DIAB W/UNSPEC COMP TYPE II/UNSPEC TYPE UNCNTRL   . ERECTILE DYSFUNCTION   . GERD   . HYPERLIPIDEMIA-MIXED   . HYPERTENSION, BENIGN   . LBBB (left bundle branch block) 02/02/2016  . MORTON'S NEUROMA, RIGHT   . Peripheral neuropathy   . Seasonal allergies    takes Allegra and Benadryl daily prn;uses Flonase daily  . SHOULDER PAIN, RIGHT   .  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 TIA on medication 02/2012  . Unstable angina ([place]) 07/09/2020    Past Surgical History:  Procedure Laterality Date  . ANGIOPLASTY    . COLONOSCOPY    . CORONARY ANGIOPLASTY  2005   2 stents  . CORONARY STENT INTERVENTION N/A 07/10/2020   Procedure: CORONARY STENT INTERVENTION;  Surgeon: Walker, Peter M, MD;  Location: [facility];  Service: Cardiovascular;  Laterality: N/A;  . DENTAL SURGERY    . LARYNGOPLASTY  08/07/2012   Procedure: LARYNGOPLASTY;  Surgeon: Izora Gala, MD;  Location: [facility];  Service: ENT;  Laterality: Left;  Left Vocal Cord Medialyzation  . LEFT HEART CATH AND CORONARY ANGIOGRAPHY N/A 07/10/2020    Procedure: LEFT HEART CATH AND CORONARY ANGIOGRAPHY;  Surgeon: Walker, Peter M, MD;  Location: [facility];  Service: Cardiovascular;  Laterality: N/A;  . left knee surgury     x 2   . stent  2001, 2004   coronary stents     Current Outpatient Medications  Medication Sig Dispense Refill  . aspirin EC 81 MG tablet Take 1 tablet (81 mg total) by mouth daily. Swallow whole. 30 tablet 11  . atorvastatin (LIPITOR) 40 MG tablet Take 1 tablet (40 mg total) by mouth daily. 90 tablet 2  . Blood Glucose Monitoring Suppl (ONETOUCH VERIO) w/Device KIT USE METER TO CHECK GLUCOSE ONCE DAILY AS DIRECTED    . calcium carbonate (TUMS - DOSED IN MG ELEMENTAL CALCIUM) 500 MG chewable tablet Chew 1 tablet by mouth daily as needed for indigestion or heartburn.    . carvedilol (COREG) 12.5 MG tablet TAKE 1 & 1/2 (ONE & ONE-HALF) TABLETS BY MOUTH TWICE DAILY (Patient taking differently: Take 18.75 mg by mouth 2 (two) times daily with a meal. ) 270 tablet 3  . clopidogrel (PLAVIX) 75 MG tablet Take 1 tablet (75 mg total) by mouth daily. Please schedule annual appt in April for refills. Thank you 90 tablet 3  . Dulaglutide (TRULICITY) 7.86 VE/7.2CN SOPN Inject 0.5 mLs (0.75 mg total) into the skin once a week. 6 mL 3  . glucose blood (ONETOUCH VERIO) test strip 1 each by Other route 2 (two) times daily. And lancets 2/day. 200 each 3  . Insulin Lispro Prot & Lispro (HUMALOG MIX 50/50 KWIKPEN) (50-50) 100 UNIT/ML Kwikpen Inject 32 Units into the skin daily with breakfast.    . losartan (COZAAR) 100 MG tablet Take 100 mg by mouth daily.    . nitroGLYCERIN (NITROSTAT) 0.4 MG SL tablet Place 1 tablet (0.4 mg total) under the tongue every 5 (five) minutes as needed for chest pain (up to 3 doses). 25 tablet 3  . OneTouch Delica Lancets 47S MISC 1 each by Does not apply route 2 (two) times daily. Use to monitor glucose levels BID; E11.9 100 each 3  . RELION PEN NEEDLES 31G X 6 MM MISC USE 1 PEN NEEDLE ONCE DAILY 50 each  0  . RELION PEN NEEDLES 32G X 4 MM MISC USE 1 ONCE DAILY AS DIRECTED  11  . senna-docusate (SENOKOT-S) 8.6-50 MG per tablet Take 2 tablets by mouth 2 (two) times daily. For constipation.    . simethicone (MYLICON) 962 MG chewable tablet Chew 125 mg by mouth every 6 (six) hours as needed for flatulence.    . furosemide (LASIX) 40 MG tablet Take 1 tablet (40 mg total) by mouth daily. 90 tablet 3  No current facility-administered medications for this visit.    Allergies:   Penicillins, Pneumococcal vaccines, Shellfish allergy, Influenza vaccines, Lisinopril, Topiramate, and Amlodipine    Social History:  The patient  reports that he quit smoking about 37 years ago. His smoking use included cigarettes. He has a 22.50 pack-year smoking history. He has never used smokeless tobacco. He reports that he does not drink alcohol and does not use drugs.   Family History:  The patient's family history includes Cancer in his mother.    ROS:  Please see the history of present illness.   Otherwise, review of systems are positive for L side weakness since his stroke and cough.  All other systems are reviewed and negative.    PHYSICAL EXAM: VS:  BP (!) 150/95   Pulse 74   Temp (!) 97.5 F (36.4 C)   Ht _0  (1.803 m)   Wt 221 lb 12.8 oz (100.6 kg)   SpO2 98%   BMI 30.93 kg/m  , BMI Body mass index is 30.93 kg/m. GENERAL:  Well appearing HEENT: Pupils equal round and reactive, fundi not visualized, oral mucosa unremarkable NECK:  No jugular venous distention, waveform within normal limits, carotid upstroke brisk and symmetric, no bruits LUNGS:  Clear to auscultation bilaterally HEART:  RRR.  PMI not displaced or sustained,S1 and S2 within normal limits, no S3, no S4, no clicks, no rubs, no murmurs ABD:  Flat, positive bowel sounds normal in frequency in pitch, no bruits, no rebound, no guarding, no midline pulsatile mass, no hepatomegaly, no splenomegaly EXT:  2 plus pulses throughout, 1+ LE  edema, no cyanosis no clubbing SKIN:  No rashes no nodules NEURO:  Cranial nerves II through XII grossly intact, L sided weakness PSYCH:  Cognitively intact, oriented to person place and time  EKG:  EKG is ordered today. The ekg ordered 3//13/17 demonstrates sinus rhythm rate 64 bpm.  LBBB.   07/20/17: Sinus rhythm. Rate 74 bpm. Left bundle branch block. 09/01/18; Sinus rhythm.  Rate 71 bpm.  LBBB 07/09/20: Sinus rhythm.  Rate 91 bpm.  LBBB. 07/16/20: Sinus rhythm.  Rate 80 bpm.  LBBB. 08/08/2020: Sinus rhythm.  Rate 74 bpm.  Left bundle branch block.  LHC 07/10/20:   2nd Mrg-1 lesion is 95% stenosed.  2nd Mrg-2 lesion is 30% stenosed.  RPDA lesion is 100% stenosed.  A drug-eluting stent was successfully placed using a STENT RESOLUTE ONYX 2.75X8.  Post intervention, there is a 0% residual stenosis.  Post intervention, there is a 0% residual stenosis.  LV end diastolic pressure is normal.   1. 2 vessel obstructive CAD    -95% ostial OM2. This was at the proximal margin of the old stents    - 100% distal PDA. This fills by left to right collaterals. 2. Normal LVEDP 3. Successful PCI of the ostium of the OM with DES x 1.  Plan: DAPT indefinitely given multiple stents in OM. Anticipate same day DC. Will hold losartan and HCTZ until renal function repeated as outpatient.    Echo 09/20/18: Study Conclusions   - Left ventricle: abnormal septal motion. The cavity size was  mildly dilated. Wall thickness was increased in a pattern of  severe LVH. Systolic function was mildly to moderately reduced.  The estimated ejection fraction was in the range of 40% to 45%.  Doppler parameters are consistent with abnormal left ventricular  relaxation (grade 1 diastolic dysfunction).  - Atrial septum: There was increased thickness of the septum,  consistent  with lipomatous hypertrophy. No defect or patent  foramen ovale was identified.   Echo 07/2020: IMPRESSIONS    1. Left  ventricular ejection fraction, by estimation, is 55%. The left  ventricle has normal function. The left ventricle has no regional wall  motion abnormalities but there was septal-lateral dyssynchrony consistent  with LBBB. Left ventricular diastolic  parameters are consistent with Grade I diastolic dysfunction (impaired  relaxation).  2. Right ventricular systolic function is normal. The right ventricular  size is normal. There is normal pulmonary artery systolic pressure. The  estimated right ventricular systolic pressure is 14.4 mmHg.  3. The aortic valve is tricuspid. Aortic valve regurgitation is not  visualized. No aortic stenosis is present.  4. The mitral valve is normal in structure. Trivial mitral valve  regurgitation. No evidence of mitral stenosis.  5. The inferior vena cava is normal in size with greater than 50%  respiratory variability, suggesting right atrial pressure of 3 mmHg.   Carotid Doppler 10/24/14: Normal study  Lexiscan Myoview 10/2018:  Nuclear stress EF: 43%. The left ventricular ejection fraction is moderately decreased (30-44%).  Defect 1: There is a medium defect of moderate severity present in the mid anteroseptal, mid inferoseptal, apical septal and apex location. This septal defect is likely due to the LBBB.  This is an intermediate risk study. This study is similar to the previous study in 2017.  ABI 08/2018: Normal bilaterally  Recent Labs: 10/17/2019: TSH 2.08 07/07/2020: ALT 28 07/10/2020: BUN 29; Creatinine, Ser 2.03; Hemoglobin 9.7; Platelets 190; Potassium 3.9; Sodium 137    Lipid Panel    Component Value Date/Time   CHOL 112 04/15/2020 1128   CHOL 164 07/20/2017 1202   TRIG 136.0 04/15/2020 1128   TRIG 200 04/20/2009 0000   HDL 44.60 04/15/2020 1128   HDL 55 07/20/2017 1202   CHOLHDL 3 04/15/2020 1128   VLDL 27.2 04/15/2020 1128   LDLCALC 40 04/15/2020 1128   LDLCALC 82 07/20/2017 1202   LDLDIRECT 63.0 01/12/2016 0931    Wt  Readings from Last 3 Encounters:  08/08/20 221 lb 12.8 oz (100.6 kg)  07/16/20 216 lb 12.8 oz (98.3 kg)  07/11/20 210 lb (95.3 kg)      ASSESSMENT AND PLAN:  # CAD s/p PCI x2: # NSTEMI:  [name] underwent successful PCI of OM 2.  He is feeling much better.  He will continue on indefinite dual antiplatelet therapy per Dr. Martin[place].  Continue aspirin, clopidogrel, carvedilol, and atorvastatin.  # Chronic systolic and diastolic heart failure: [name] is volume overloaded in the setting of holding losartan/chlorthalidone.  LVEF was 40 to 45% previously and improved to 55% post cath.  We will continue carvedilol and add back losartan today.  Had Lasix 40 mg daily.  # Hypertension: BP is elevated.  Resume losartan 174m daily.  Given his CKD 4 we will not resume chlorthalidone.  Instead we will add furosemide 40 mg daily.  He will need a basic metabolic panel checked in a week.  Continue carvedilol.  # Hyperlipidemia: LDL 40 on 03/2020.  Continue atorvastatin.   # CKD IV:  He has been referred to nephrology.  Resuming losartan and adding Lasix as above.  Current medicines are reviewed at length with the patient today.  The patient does not have concerns regarding medicines.  The following changes have been made: No changes  Labs/ tests ordered today include:    Orders Placed This Encounter  Procedures  . Basic metabolic panel  .  EKG 12-Lead     Disposition:   FU with [name] C. Oval Linsey, MD, [facility] in 1 month virtually.     Signed, Danilo Cappiello C. Oval Linsey, MD, [facility]  08/08/2020 11:17 AM    [place]

## 2020-08-08 NOTE — Patient Instructions (Addendum)
Your blood pressure is high today.  Please see your primary care provider soon, to have it rechecked Please continue the same medications On this type of insulin schedule, you should eat meals on a regular schedule (especially breakfast and lunch).  If a meal is missed or significantly delayed, your blood sugar could go low.   Please come back for a follow-up appointment in 2-3 months.   check your blood sugar twice a day.  vary the time of day when you check, between before the 3 meals, and at bedtime.  also check if you have symptoms of your blood sugar being too high or too low.  please keep a record of the readings and bring it to your next appointment here (or you can bring the meter itself).  You can write it on any piece of paper.  please call us sooner if your blood sugar goes below 70, or if you have a lot of readings over 200.

## 2020-08-11 ENCOUNTER — Ambulatory Visit (HOSPITAL_COMMUNITY): Payer: Medicare HMO

## 2020-08-13 ENCOUNTER — Ambulatory Visit (HOSPITAL_COMMUNITY): Payer: Medicare HMO

## 2020-08-14 ENCOUNTER — Other Ambulatory Visit: Payer: Self-pay | Admitting: Nephrology

## 2020-08-14 DIAGNOSIS — I129 Hypertensive chronic kidney disease with stage 1 through stage 4 chronic kidney disease, or unspecified chronic kidney disease: Secondary | ICD-10-CM

## 2020-08-14 DIAGNOSIS — N1832 Chronic kidney disease, stage 3b: Secondary | ICD-10-CM

## 2020-08-14 DIAGNOSIS — E1122 Type 2 diabetes mellitus with diabetic chronic kidney disease: Secondary | ICD-10-CM

## 2020-08-15 ENCOUNTER — Ambulatory Visit (HOSPITAL_COMMUNITY): Payer: Medicare HMO

## 2020-08-16 LAB — BASIC METABOLIC PANEL
BUN/Creatinine Ratio: 13 (ref 10–24)
BUN: 24 mg/dL (ref 8–27)
CO2: 27 mmol/L (ref 20–29)
Calcium: 9.2 mg/dL (ref 8.6–10.2)
Chloride: 104 mmol/L (ref 96–106)
Creatinine, Ser: 1.88 mg/dL — ABNORMAL HIGH (ref 0.76–1.27)
GFR calc Af Amer: 41 mL/min/{1.73_m2} — ABNORMAL LOW (ref >59)
GFR calc non Af Amer: 35 mL/min/{1.73_m2} — ABNORMAL LOW (ref >59)
Glucose: 189 mg/dL — ABNORMAL HIGH (ref 65–99)
Potassium: 4.4 mmol/L (ref 3.5–5.2)
Sodium: 144 mmol/L (ref 134–144)

## 2020-08-18 ENCOUNTER — Ambulatory Visit (HOSPITAL_COMMUNITY): Payer: Medicare HMO

## 2020-08-20 ENCOUNTER — Telehealth: Payer: Self-pay | Admitting: *Deleted

## 2020-08-20 ENCOUNTER — Ambulatory Visit
Admission: RE | Admit: 2020-08-20 | Discharge: 2020-08-20 | Disposition: A | Discharge status: Home / Self Care | Payer: Medicare HMO | Source: Ambulatory Visit | Attending: Nephrology | Admitting: Nephrology

## 2020-08-20 ENCOUNTER — Ambulatory Visit (HOSPITAL_COMMUNITY): Payer: Medicare HMO

## 2020-08-20 DIAGNOSIS — I129 Hypertensive chronic kidney disease with stage 1 through stage 4 chronic kidney disease, or unspecified chronic kidney disease: Secondary | ICD-10-CM

## 2020-08-20 DIAGNOSIS — E1122 Type 2 diabetes mellitus with diabetic chronic kidney disease: Secondary | ICD-10-CM

## 2020-08-20 DIAGNOSIS — N1832 Chronic kidney disease, stage 3b: Secondary | ICD-10-CM

## 2020-08-20 NOTE — Telephone Encounter (Signed)
Called [facility] to follow up on referral, patient was seen 9/22

## 2020-08-22 ENCOUNTER — Other Ambulatory Visit: Payer: Self-pay

## 2020-08-22 ENCOUNTER — Ambulatory Visit (HOSPITAL_COMMUNITY): Payer: Medicare HMO

## 2020-08-22 ENCOUNTER — Ambulatory Visit: Payer: Medicare HMO | Admitting: Pharmacist

## 2020-08-22 DIAGNOSIS — E119 Type 2 diabetes mellitus without complications: Secondary | ICD-10-CM

## 2020-08-22 DIAGNOSIS — I5042 Chronic combined systolic (congestive) and diastolic (congestive) heart failure: Secondary | ICD-10-CM

## 2020-08-22 DIAGNOSIS — I10 Essential (primary) hypertension: Secondary | ICD-10-CM

## 2020-08-22 DIAGNOSIS — E7849 Other hyperlipidemia: Secondary | ICD-10-CM

## 2020-08-22 DIAGNOSIS — I2510 Atherosclerotic heart disease of native coronary artery without angina pectoris: Secondary | ICD-10-CM

## 2020-08-22 NOTE — Chronic Care Management (AMB) (Signed)
Chronic Care Management Pharmacy  Name: Orlin Kann  MRN: [id number] DOB: 1951-07-30  Chief Complaint/ HPI  [name],  69 y.o. , male presents for their Follow-Up CCM visit with the clinical pharmacist via telephone due to COVID-19 Pandemic.  PCP : Binnie Rail, MD  Patient Care Team: Binnie Rail, MD as PCP - General (Internal Medicine) Skeet Latch, MD as PCP - Cardiology (Cardiology) Nahser, Wonda Cheng, MD (Cardiology) Renato Shin, MD (Endocrinology) Izora Gala, MD (Otolaryngology) Garvin Fila, MD (Neurology) Almedia Balls, MD (Orthopedic Surgery) Sable Feil, MD (Gastroenterology) Wynona Canes, MD as Consulting Physician (Neurology) Charlton Haws, [facility] as Pharmacist (Pharmacist)   Their chronic conditions include: Hypertension, Hyperlipidemia, Diabetes, Heart Failure, Coronary Artery Disease, GERD, Chronic Kidney Disease, Osteoarthritis and Neuropathy  Patietn reports he works out 3 days per week with friend who is a Insurance underwriter - Weight training, treadmill. Goes to a restaurant few days a week - [place], [place]. Lives with daughter for a year now.   Office Visits: 05/16/20 NP Marvis Repress VV: chronic cough, recent Xray negative; possible GERD component, trial Protonix 40 mg BID and referred to pulmonology.   04/15/20 Dr Quay Burow OV: c/o cough x months. Ordered CXR. Kidney functioning worsening - emphasize hydration and DM/BP control. Gave doxycycline course.  Consult Visit: 08/13/20 [facility] - initial visit per chart.  08/08/20 Dr Loanne Drilling (endocrine): advised to increase Trulicity and decrease insulin but pt declined.  08/08/20 Dr Oval Linsey (cardiology): BP more elevated, increasing SOB and edema. ECHO 07/2020 improved EF to 55 from 40-45%. Advised to resume losartan and add furosemide 40 mg.  07/16/20 Dr Oval Linsey (cardiology): continue to hold chlorthalidone and losartan. Referred to nephrology. Referred to cardiac  rehab.  07/11/20 Dr Gala Murdoch (pulmonary): SOB improved w/ cath. Ordered PFT (former smoker, hx asbestos exposure).  07/10/20 cardiac cath: NSTEMI, successful PCI w/ DES x 1. Plan indefinite DAPT given multiple stents. Hold losartan and chlorthalidone until repeat BMP.  07/09/20 Dr Oval Linsey (cardiology): positive troponin in ED, ordered ischemic workup, hold losartan and chlorthalidone in preparation for cath. Increase carvedilol to 25 mg BID  07/07/20 ED visit: chest pain, back pain, diarrhea. Left prior to being seen.  06/13/20 Dr Loanne Drilling (endocrine): increase Trulicity to 2 shot once a week (1.5 mg). Reduce insulin to 20 units with breakfast. Trulicity reduced back to 0.75 mg after pt has worsening GERD.  05/12/20 ED visit: for cough, chest Xray negative for acute issue, BMP CBC WNL  04/10/20 Dr Loanne Drilling (endocrine): reduce insulin to 32 units with breakfast, start Trulicity. Pt attempting Lilly Cares PAP.   04/02/20 NP Purcell Nails (cardiology): BP control improved, no med changes.   Allergies  Allergen Reactions  . Penicillins Anaphylaxis  . Pneumococcal Vaccines Anaphylaxis  . Shellfish Allergy Anaphylaxis  . Influenza Vaccines   . Lisinopril Cough  . Topiramate Other (See Comments)    Chest spasms and numbness  . Amlodipine Other (See Comments)    LE edema   Medications: Outpatient Encounter Medications as of 08/22/2020  Medication Sig Note  . aspirin EC 81 MG tablet Take 1 tablet (81 mg total) by mouth daily. Swallow whole.   Marland Kitchen atorvastatin (LIPITOR) 40 MG tablet Take 1 tablet (40 mg total) by mouth daily.   . Blood Glucose Monitoring Suppl (ONETOUCH VERIO) w/Device KIT USE METER TO CHECK GLUCOSE ONCE DAILY AS DIRECTED   . calcium carbonate (TUMS - DOSED IN MG ELEMENTAL CALCIUM) 500 MG chewable tablet Chew 1 tablet by mouth daily as  needed for indigestion or heartburn.   . carvedilol (COREG) 12.5 MG tablet TAKE 1 & 1/2 (ONE & ONE-HALF) TABLETS BY MOUTH TWICE DAILY (Patient taking  differently: Take 18.75 mg by mouth 2 (two) times daily with a meal. ) 08/04/2020: 2 tabs in the morning, 1 tab in the evening  . chlorthalidone (HYGROTON) 25 MG tablet Take 25 mg by mouth daily.   . clopidogrel (PLAVIX) 75 MG tablet Take 1 tablet (75 mg total) by mouth daily. Please schedule annual appt in April for refills. Thank you   . Dulaglutide (TRULICITY) 5.70 VX/7.9TJ SOPN Inject 0.5 mLs (0.75 mg total) into the skin once a week.   . furosemide (LASIX) 40 MG tablet Take 1 tablet (40 mg total) by mouth daily.   Marland Kitchen glucose blood (ONETOUCH VERIO) test strip 1 each by Other route 2 (two) times daily. And lancets 2/day.   . Insulin Lispro Prot & Lispro (HUMALOG MIX 50/50 KWIKPEN) (50-50) 100 UNIT/ML Kwikpen Inject 32 Units into the skin daily with breakfast.   . losartan (COZAAR) 100 MG tablet Take 100 mg by mouth daily.   . nitroGLYCERIN (NITROSTAT) 0.4 MG SL tablet Place 1 tablet (0.4 mg total) under the tongue every 5 (five) minutes as needed for chest pain (up to 3 doses).   [name] Delica Lancets 03E MISC 1 each by Does not apply route 2 (two) times daily. Use to monitor glucose levels BID; E11.9   . RELION PEN NEEDLES 31G X 6 MM MISC USE 1 PEN NEEDLE ONCE DAILY   . RELION PEN NEEDLES 32G X 4 MM MISC USE 1 ONCE DAILY AS DIRECTED   . senna-docusate (SENOKOT-S) 8.6-50 MG per tablet Take 2 tablets by mouth 2 (two) times daily. For constipation.   . simethicone (MYLICON) 092 MG chewable tablet Chew 125 mg by mouth every 6 (six) hours as needed for flatulence.    No facility-administered encounter medications on file as of 08/22/2020.   Wt Readings from Last 3 Encounters:  08/08/20 222 lb (100.7 kg)  08/08/20 221 lb 12.8 oz (100.6 kg)  07/16/20 216 lb 12.8 oz (98.3 kg)    Current Diagnosis/Assessment:   Goals Addressed            This Visit's Progress   . Pharmacy Care Plan       CARE PLAN ENTRY  Current Barriers:  . Chronic Disease Management support, education, and care  coordination needs related to Hypertension, Hyperlipidemia, Diabetes, Heart Failure, Coronary Artery Disease, and Constipation   Hypertension / Heart Failure BP Readings from Last 3 Encounters:  04/15/20 (!) 144/80  04/10/20 (!) 144/70  04/02/20 129/70 .  Pharmacist Clinical Goal(s): o Over the next 90 days, patient will work with PharmD and providers to achieve BP goal <140/90 . Current regimen:  o Carvedilol 25 mg twice daily o Chlorthalidone 25 mg daily o Losartan 100 mg daily o Furosemide 40 mg daily as needed . Interventions: o Discussed benefits of medications for controlling BP and heart failure, and prevention of heart attack and stroke . Patient self care activities - Over the next 90 days, patient will: o Check BP daily, document, and provide at future appointments o Ensure daily salt intake < 2300 mg/day  Hyperlipidemia / Coronary artery disease Lab Results  Component Value Date/Time   LDLCALC 40 04/15/2020 11:28 AM   LDLCALC 82 07/20/2017 12:02 PM   LDLDIRECT 63.0 01/12/2016 09:31 AM   Pharmacist Clinical Goal(s): o Over the next 90 days, patient will  work with PharmD and providers to maintain LDL goal < 70 . Current regimen:  o Atorvastatin 40 mg daily o Clopidogrel 75 mg daily o Nitroglycerin 0.4 mg as needed . Interventions: o Discussed benefits of medications for prevention of heart attack and stroke o Discussed when to use nitroglycerin . Patient self care activities - Over the next 90 days, patient will: o Continue medication as prescribed o Continue low cholesterol diet and exercise routine  Diabetes Lab Results  Component Value Date/Time   HGBA1C 9.0 (A) 04/10/2020 01:12 PM   HGBA1C 9.0 (A) 10/09/2019 10:01 AM   HGBA1C 7.7 (H) 08/28/2018 10:12 AM   HGBA1C 9.3 (H) 02/24/2018 11:09 AM .  Pharmacist Clinical Goal(s): o Over the next 90 days, patient will work with PharmD and providers to achieve A1c goal <7% without hypoglycemia . Current regimen:   o Trulicity 0.53 mg once weekly o Humalog mix 50/50 32 units with breakfast . Interventions: o Discussed BG goals and benefits of medications for prevention of diabetic complications . Patient self care activities - Over the next 90 days, patient will: o Check blood sugar twice daily, document, and provide at future appointments o Contact provider with any episodes of hypoglycemia  Constipation . Pharmacist Clinical Goal(s) o Over the next 90 days, patient will work with PharmD and providers to optimize therapy . Current regimen:  o Senna-docusate 2 tablets twice a day o Whole Foods Fiber 2 capsules twice a day o Miralax as needed . Interventions: o Discussed importance of dietary fiber and adequate hydration . Patient self care activities - Over the next 90 days, patient will: o Continue current strategy  Medication management . Pharmacist Clinical Goal(s): o Over the next 90 days, patient will work with PharmD and providers to achieve optimal medication adherence . Current pharmacy: [facility] . Interventions o Comprehensive medication review performed. o Continue current medication management strategy . Patient self care activities - Over the next 90 days, patient will: o Focus on medication adherence by fill date o Take medications as prescribed o Report any questions or concerns to PharmD and/or provider(s)  Please see past updates related to this goal by clicking on the "Past Updates" button in the selected goal         Heart Failure / Hypertension   HF Type: Combined Systolic and Diastolic Last ejection [id number]:37% (07/29/2020, improved)  BP goal is:  <140/90  Recent Office BP readings: BP Readings from Last 3 Encounters:  08/08/20 (!) 142/80  08/08/20 (!) 150/95  07/16/20 118/70   Kidney Function Lab Results  Component Value Date/Time   CREATININE 1.88 (H) 08/15/2020 01:12 PM   CREATININE 2.03 (H) 07/10/2020 06:20 AM   GFR 41.31 (L) 04/15/2020 11:28 AM    GFRNONAA 35 (L) 08/15/2020 01:12 PM   GFRAA 41 (L) 08/15/2020 01:12 PM   Patient checks BP at home daily Patient home BP readings are ranging: 140s/80s  Patient has failed these meds in past: n/a Patient is currently controlled on the following medications:   Carvedilol 25 mg BID   Losartan 100 mg daily  Furosemide 40 mg daily PRN - typically daily  Chlorthalidone 25 mg  daily  We discussed diet and exercise extensively; benefits of medications and lifelong duration of treatment; pt exercises 3 days per week with personal trainer; chlorthalidone was initially stopped by cardiologist but nephrologist resumed it.  Plan  Continue current medications and control with diet and exercise   Hyperlipidemia / CAD   CAD - NSTEMI  06/2020 w/ DES; BMS 2001, DES 2005; stroke 2013 LDL goal < 70  Lipid Panel     Component Value Date/Time   CHOL 112 04/15/2020 1128   CHOL 164 07/20/2017 1202   TRIG 136.0 04/15/2020 1128   TRIG 200 04/20/2009 0000   HDL 44.60 04/15/2020 1128   HDL 55 07/20/2017 1202   CHOLHDL 3 04/15/2020 1128   VLDL 27.2 04/15/2020 1128   LDLCALC 40 04/15/2020 1128   LDLCALC 82 07/20/2017 1202   LDLDIRECT 63.0 01/12/2016 0931   LABVLDL 27 07/20/2017 1202   Hepatic Function Latest Ref Rng & Units 07/07/2020 04/15/2020 10/17/2019  Total Protein 6.5 - 8.1 g/dL 8.2(H) 7.9 8.0  Albumin 3.5 - 5.0 g/dL 4.3 4.3 3.9  AST 15 - 41 U/L 25 19 19   ALT 0 - 44 U/L 28 23 20   Alk Phosphatase 38 - 126 U/L 58 74 78  Total Bilirubin 0.3 - 1.2 mg/dL 1.0 0.9 1.0  Bilirubin, Direct 0.0 - 0.3 mg/dL - - -   The ASCVD Risk score Mikey Bussing DC Jr., et al., 2013) failed to calculate for the following reasons:   The patient has a prior MI or stroke diagnosis   Patient has failed these meds in past: n/a Patient is currently controlled on the following medications:  . Atorvastatin 40 mg daily . Clopidogrel 75 mg daily HS . Nitroglycerin 0.4 mg SL prn  We discussed:  diet and exercise  extensively; benefits of statin and clopidogrel for secondary prevention; pt has had minor episodes of chest pain since stent placement in August but nothing bad enough to warrant use of NTG. He has frequent follow up with cardiologist.   Plan  Continue current medications and control with diet and exercise  Diabetes   A1c goal < 7% without hypoglycemia  Recent Relevant Labs: Lab Results  Component Value Date/Time   HGBA1C 7.1 (A) 08/08/2020 02:24 PM   HGBA1C 7.5 (A) 06/13/2020 12:58 PM   HGBA1C 7.7 (H) 08/28/2018 10:12 AM   HGBA1C 9.3 (H) 02/24/2018 11:09 AM   GFR 41.31 (L) 04/15/2020 11:28 AM   GFR 48.79 (L) 10/17/2019 01:29 PM   MICROALBUR 7.5 (H) 08/17/2016 10:30 AM   MICROALBUR 9.6 (H) 08/07/2015 11:17 AM    Last diabetic Eye exam:  Lab Results  Component Value Date/Time   HMDIABEYEEXA Retinopathy (A) 09/23/2017 12:00 AM    Last diabetic Foot exam:  Lab Results  Component Value Date/Time   HMDIABFOOTEX done 11/30/2019 12:00 AM    Checking BG: 2x per Day  Recent FBG Readings: 120-140s  Patient has failed these meds in past: n/a Patient is currently uncontrolled on the following medications: . Trulicity 0.86 mg once weekly  . Humalog mix 50/50 32 units with breakfast  We discussed: Lilly Cares PAP was approved in June and patient has received medications. Pt could not tolerate 1.5 mg of Trulicity due to worsening GERD and nausea. He denies hypoglycemia in the past several weeks. Congratulated patient on improvement in BG and A1c over last few months.  Plan  Continue current medications and control with diet and exercise  Chronic cough   Patient has failed these meds in past: n/a Patient is currently controlled on the following medications:  . Pantoprazole 40 mg BID  We discussed:  Pt reports cough has improved since starting PPI. He saw pulmonology in August and scheduled PFTs for November.   Plan  Continue current medications  Constipation   Patient is  currently uncontrolled on the following  medications:  . Senna-docusate 2 tabs BID . Whole Food Fiber 2 pills BID . Miralax PRN  We discussed: pt reports constipation has improved, he is no longer taking Miralax daily and is having regular BM.  Plan  Continue current medications   Medication Management   Pt uses [facility] for all medications Uses pill box? No - keeps bottles in container separated by day night Pt endorses 99% compliance  We discussed: [facility] preferred [facility] by [facility]. He would like to switch to [facility] but it is not a preferred pharmacy with current plan so he will stay with [facility].   Plan  Continue current medication management strategy    Follow up: 3 month phone visit  Charlene Brooke, PharmD, [facility] Clinical Pharmacist [facility] [phone]

## 2020-08-22 NOTE — Patient Instructions (Addendum)
Visit Information  Phone number for Pharmacist: [phone]  Goals Addressed            This Visit's Progress   . Pharmacy Care Plan       CARE PLAN ENTRY  Current Barriers:  . Chronic Disease Management support, education, and care coordination needs related to Hypertension, Hyperlipidemia, Diabetes, Heart Failure, Coronary Artery Disease, and Constipation   Hypertension / Heart Failure BP Readings from Last 3 Encounters:  04/15/20 (!) 144/80  04/10/20 (!) 144/70  04/02/20 129/70 .  Pharmacist Clinical Goal(s): o Over the next 90 days, patient will work with PharmD and providers to achieve BP goal <140/90 . Current regimen:  o Carvedilol 25 mg twice daily o Chlorthalidone 25 mg daily o Losartan 100 mg daily o Furosemide 40 mg daily as needed . Interventions: o Discussed benefits of medications for controlling BP and heart failure, and prevention of heart attack and stroke . Patient self care activities - Over the next 90 days, patient will: o Check BP daily, document, and provide at future appointments o Ensure daily salt intake < 2300 mg/day  Hyperlipidemia / Coronary artery disease Lab Results  Component Value Date/Time   LDLCALC 40 04/15/2020 11:28 AM   LDLCALC 82 07/20/2017 12:02 PM   LDLDIRECT 63.0 01/12/2016 09:31 AM   Pharmacist Clinical Goal(s): o Over the next 90 days, patient will work with PharmD and providers to maintain LDL goal < 70 . Current regimen:  o Atorvastatin 40 mg daily o Clopidogrel 75 mg daily o Nitroglycerin 0.4 mg as needed . Interventions: o Discussed benefits of medications for prevention of heart attack and stroke o Discussed when to use nitroglycerin . Patient self care activities - Over the next 90 days, patient will: o Continue medication as prescribed o Continue low cholesterol diet and exercise routine  Diabetes Lab Results  Component Value Date/Time   HGBA1C 9.0 (A) 04/10/2020 01:12 PM   HGBA1C 9.0 (A) 10/09/2019 10:01 AM    HGBA1C 7.7 (H) 08/28/2018 10:12 AM   HGBA1C 9.3 (H) 02/24/2018 11:09 AM .  Pharmacist Clinical Goal(s): o Over the next 90 days, patient will work with PharmD and providers to achieve A1c goal <7% without hypoglycemia . Current regimen:  o Trulicity 9.73 mg once weekly o Humalog mix 50/50 32 units with breakfast . Interventions: o Discussed BG goals and benefits of medications for prevention of diabetic complications . Patient self care activities - Over the next 90 days, patient will: o Check blood sugar twice daily, document, and provide at future appointments o Contact provider with any episodes of hypoglycemia  Constipation . Pharmacist Clinical Goal(s) o Over the next 90 days, patient will work with PharmD and providers to optimize therapy . Current regimen:  o Senna-docusate 2 tablets twice a day o Whole Foods Fiber 2 capsules twice a day o Miralax as needed . Interventions: o Discussed importance of dietary fiber and adequate hydration . Patient self care activities - Over the next 90 days, patient will: o Continue current strategy  Medication management . Pharmacist Clinical Goal(s): o Over the next 90 days, patient will work with PharmD and providers to achieve optimal medication adherence . Current pharmacy: [facility] . Interventions o Comprehensive medication review performed. o Continue current medication management strategy . Patient self care activities - Over the next 90 days, patient will: o Focus on medication adherence by fill date o Take medications as prescribed o Report any questions or concerns to PharmD and/or provider(s)  Please see  past updates related to this goal by clicking on the "Past Updates" button in the selected goal       Patient verbalizes understanding of instructions provided today.  Telephone follow up appointment with pharmacy team member scheduled for: 3 months  Charlene Brooke, PharmD, BCACP Clinical Pharmacist [facility] [phone]  [place] stands for "Dietary Approaches to Stop Hypertension." The DASH eating plan is a healthy eating plan that has been shown to reduce high blood pressure (hypertension). It may also reduce your risk for type 2 diabetes, heart disease, and stroke. The DASH eating plan may also help with weight loss. What are tips for following this plan?  General guidelines  Avoid eating more than 2,300 mg (milligrams) of salt (sodium) a day. If you have hypertension, you may need to reduce your sodium intake to 1,500 mg a day.  Limit alcohol intake to no more than 1 drink a day for nonpregnant women and 2 drinks a day for men. One drink equals 12 oz of beer, 5 oz of wine, or 1 oz of hard liquor.  Work with your health care provider to maintain a healthy body weight or to lose weight. Ask what an ideal weight is for you.  Get at least 30 minutes of exercise that causes your heart to beat faster (aerobic exercise) most days of the week. Activities may include walking, swimming, or biking.  Work with your health care provider or diet and nutrition specialist (dietitian) to adjust your eating plan to your individual calorie needs. Reading food labels   Check food labels for the amount of sodium per serving. Choose foods with less than 5 percent of the Daily Value of sodium. Generally, foods with less than 300 mg of sodium per serving fit into this eating plan.  To find whole grains, look for the word "whole" as the first word in the ingredient list. Shopping  Buy products labeled as "low-sodium" or "no salt added."  Buy fresh foods. Avoid canned foods and premade or frozen meals. Cooking  Avoid adding salt when cooking. Use salt-free seasonings or herbs instead of table salt or sea salt. Check with your health care provider or pharmacist before using salt substitutes.  Do not fry foods. Cook foods using healthy methods such as baking, boiling,  grilling, and broiling instead.  Cook with heart-healthy oils, such as olive, canola, soybean, or sunflower oil. Meal planning  Eat a balanced diet that includes: ? 5 or more servings of fruits and vegetables each day. At each meal, try to fill half of your plate with fruits and vegetables. ? Up to 6-8 servings of whole grains each day. ? Less than 6 oz of lean meat, poultry, or fish each day. A 3-oz serving of meat is about the same size as a deck of cards. One egg equals 1 oz. ? 2 servings of low-fat dairy each day. ? A serving of nuts, seeds, or beans 5 times each week. ? Heart-healthy fats. Healthy fats called Omega-3 fatty acids are found in foods such as flaxseeds and coldwater fish, like sardines, salmon, and mackerel.  Limit how much you eat of the following: ? Canned or prepackaged foods. ? Food that is high in trans fat, such as fried foods. ? Food that is high in saturated fat, such as fatty meat. ? Sweets, desserts, sugary drinks, and other foods with added sugar. ? Full-fat dairy products.  Do not salt foods before eating.  Try  to eat at least 2 vegetarian meals each week.  Eat more home-cooked food and less restaurant, buffet, and fast food.  When eating at a restaurant, ask that your food be prepared with less salt or no salt, if possible. What foods are recommended? The items listed may not be a complete list. Talk with your dietitian about what dietary choices are best for you. Grains Whole-grain or whole-wheat bread. Whole-grain or whole-wheat pasta. Brown rice. [place]. Bulgur. Whole-grain and low-sodium cereals. Pita bread. Low-fat, low-sodium crackers. Whole-wheat flour tortillas. Vegetables Fresh or frozen vegetables (raw, steamed, roasted, or grilled). Low-sodium or reduced-sodium tomato and vegetable juice. Low-sodium or reduced-sodium tomato sauce and tomato paste. Low-sodium or reduced-sodium canned vegetables. Fruits All fresh, dried, or frozen  fruit. Canned fruit in natural juice (without added sugar). Meat and other protein foods Skinless chicken or [place]. Ground chicken or [place]. Pork with fat trimmed off. Fish and seafood. Egg whites. Dried beans, peas, or lentils. Unsalted nuts, nut butters, and seeds. Unsalted canned beans. Lean cuts of beef with fat trimmed off. Low-sodium, lean deli meat. Dairy Low-fat (1%) or fat-free (skim) milk. Fat-free, low-fat, or reduced-fat cheeses. Nonfat, low-sodium ricotta or cottage cheese. Low-fat or nonfat yogurt. Low-fat, low-sodium cheese. Fats and oils Soft margarine without trans fats. Vegetable oil. Low-fat, reduced-fat, or light mayonnaise and salad dressings (reduced-sodium). Canola, safflower, olive, soybean, and sunflower oils. Avocado. Seasoning and other foods Herbs. Spices. Seasoning mixes without salt. Unsalted popcorn and pretzels. Fat-free sweets. What foods are not recommended? The items listed may not be a complete list. Talk with your dietitian about what dietary choices are best for you. Grains Baked goods made with fat, such as croissants, muffins, or some breads. Dry pasta or rice meal packs. Vegetables Creamed or fried vegetables. Vegetables in a cheese sauce. Regular canned vegetables (not low-sodium or reduced-sodium). Regular canned tomato sauce and paste (not low-sodium or reduced-sodium). Regular tomato and vegetable juice (not low-sodium or reduced-sodium). Angie Fava. Olives. Fruits Canned fruit in a light or heavy syrup. Fried fruit. Fruit in cream or butter sauce. Meat and other protein foods Fatty cuts of meat. Ribs. Fried meat. Berniece Salines. Sausage. Bologna and other processed lunch meats. Salami. Fatback. Hotdogs. Bratwurst. Salted nuts and seeds. Canned beans with added salt. Canned or smoked fish. Whole eggs or egg yolks. Chicken or [place] with skin. Dairy Whole or 2% milk, cream, and half-and-half. Whole or full-fat cream cheese. Whole-fat or sweetened yogurt. Full-fat  cheese. Nondairy creamers. Whipped toppings. Processed cheese and cheese spreads. Fats and oils Butter. Stick margarine. Lard. Shortening. Ghee. Bacon fat. Tropical oils, such as coconut, palm kernel, or palm oil. Seasoning and other foods Salted popcorn and pretzels. Onion salt, garlic salt, seasoned salt, table salt, and sea salt. Worcestershire sauce. Tartar sauce. Barbecue sauce. Teriyaki sauce. Soy sauce, including reduced-sodium. Steak sauce. Canned and packaged gravies. Fish sauce. Oyster sauce. Cocktail sauce. Horseradish that you find on the shelf. Ketchup. Mustard. Meat flavorings and tenderizers. Bouillon cubes. Hot sauce and Tabasco sauce. Premade or packaged marinades. Premade or packaged taco seasonings. Relishes. Regular salad dressings. Where to find more information:  National Heart, Lung, and [place]: https://wilson-eaton.com/  American Heart Association: www.heart.org Summary  The DASH eating plan is a healthy eating plan that has been shown to reduce high blood pressure (hypertension). It may also reduce your risk for type 2 diabetes, heart disease, and stroke.  With the DASH eating plan, you should limit salt (sodium) intake to 2,300 mg a day. If you  have hypertension, you may need to reduce your sodium intake to 1,500 mg a day.  When on the DASH eating plan, aim to eat more fresh fruits and vegetables, whole grains, lean proteins, low-fat dairy, and heart-healthy fats.  Work with your health care provider or diet and nutrition specialist (dietitian) to adjust your eating plan to your individual calorie needs. This information is not intended to replace advice given to you by your health care provider. Make sure you discuss any questions you have with your health care provider. Document Revised: 10/21/2017 Document Reviewed: 11/01/2016 Elsevier Patient Education  2020 [employer].

## 2020-08-25 ENCOUNTER — Ambulatory Visit (HOSPITAL_COMMUNITY): Payer: Medicare HMO

## 2020-08-27 ENCOUNTER — Ambulatory Visit (HOSPITAL_COMMUNITY): Payer: Medicare HMO

## 2020-08-29 ENCOUNTER — Ambulatory Visit (HOSPITAL_COMMUNITY): Payer: Medicare HMO

## 2020-08-29 ENCOUNTER — Telehealth (HOSPITAL_COMMUNITY): Payer: Self-pay | Admitting: Pharmacist

## 2020-09-01 ENCOUNTER — Ambulatory Visit (HOSPITAL_COMMUNITY): Payer: Medicare HMO

## 2020-09-01 ENCOUNTER — Telehealth (HOSPITAL_COMMUNITY): Payer: Self-pay

## 2020-09-01 NOTE — Telephone Encounter (Signed)
Cardiac Rehab Note:  Successful telephone encounter to [name] to confirm cardiac rehab orientation appointment for 09/02/20 at 1:30 pm. Unfortunately Mr. Nettleton states he has had very loose stools frequent in nature since Friday. This is not patient's normal stool consistency or pattern. This was discovered by CR RN during verbal covid-19 screening. Mr. Byrns is encouraged to contact his PCP. His cardiac rehab orientation appointment will be cancelled and rescheduled once patient's diarrhea has resolved.   Rain Wilhide E. Rollene Rotunda RN, BSN [place]. [facility]  Cardiac and Pulmonary Rehabilitation Phone: [phone] Fax: [phone]

## 2020-09-02 ENCOUNTER — Inpatient Hospital Stay (HOSPITAL_COMMUNITY): Admission: RE | Admit: 2020-09-02 | Payer: Medicare HMO | Source: Ambulatory Visit

## 2020-09-03 ENCOUNTER — Ambulatory Visit (HOSPITAL_COMMUNITY): Payer: Medicare HMO

## 2020-09-03 ENCOUNTER — Other Ambulatory Visit: Payer: Self-pay | Admitting: Endocrinology

## 2020-09-05 ENCOUNTER — Ambulatory Visit (HOSPITAL_COMMUNITY): Payer: Medicare HMO

## 2020-09-08 ENCOUNTER — Ambulatory Visit (HOSPITAL_COMMUNITY): Payer: Medicare HMO

## 2020-09-09 ENCOUNTER — Telehealth (INDEPENDENT_AMBULATORY_CARE_PROVIDER_SITE_OTHER): Payer: Medicare HMO | Admitting: Cardiovascular Disease

## 2020-09-09 ENCOUNTER — Encounter: Payer: Self-pay | Admitting: Cardiovascular Disease

## 2020-09-09 VITALS — BP 115/75 | HR 73 | Ht 71.0 in | Wt 220.0 lb

## 2020-09-09 DIAGNOSIS — Z9989 Dependence on other enabling machines and devices: Secondary | ICD-10-CM

## 2020-09-09 DIAGNOSIS — G4733 Obstructive sleep apnea (adult) (pediatric): Secondary | ICD-10-CM | POA: Insufficient documentation

## 2020-09-09 DIAGNOSIS — I10 Essential (primary) hypertension: Secondary | ICD-10-CM

## 2020-09-09 DIAGNOSIS — I5042 Chronic combined systolic (congestive) and diastolic (congestive) heart failure: Secondary | ICD-10-CM

## 2020-09-09 DIAGNOSIS — I2510 Atherosclerotic heart disease of native coronary artery without angina pectoris: Secondary | ICD-10-CM

## 2020-09-09 DIAGNOSIS — I6330 Cerebral infarction due to thrombosis of unspecified cerebral artery: Secondary | ICD-10-CM | POA: Diagnosis not present

## 2020-09-09 DIAGNOSIS — R0683 Snoring: Secondary | ICD-10-CM

## 2020-09-09 DIAGNOSIS — Z9861 Coronary angioplasty status: Secondary | ICD-10-CM

## 2020-09-09 HISTORY — DX: Snoring: R06.83

## 2020-09-09 HISTORY — DX: Dependence on other enabling machines and devices: Z99.89

## 2020-09-09 HISTORY — DX: Obstructive sleep apnea (adult) (pediatric): G47.33

## 2020-09-09 NOTE — Progress Notes (Signed)
Virtual Visit via Video Note   This visit type was conducted due to national recommendations for restrictions regarding the COVID-19 Pandemic (e.g. social distancing) in an effort to limit this patient's exposure and mitigate transmission in our community.  Due to his co-morbid illnesses, this patient is at least at moderate risk for complications without adequate follow up.  This format is felt to be most appropriate for this patient at this time.  All issues noted in this document were discussed and addressed.  A limited physical exam was performed with this format.  Please refer to the patient's chart for his consent to telehealth for [facility].  Video Connection Lost Video connection was lost at > 50% of the duration of this visit, at which time the remainder of the visit was completed via audio only.     The patient was identified using 2 identifiers.  Date:  09/09/2020   ID:  [name], DOB 02/11/1951, MRN [id number]  Patient Location: Home Provider Location: Office/Clinic  PCP:  Binnie Rail, MD  Cardiologist:  Skeet Latch, MD  Electrophysiologist:  None  Nephrologist: Dr. Joylene Grapes  Evaluation Performed:  Follow-Up Visit  Chief Complaint:  hypertension  History of Present Illness:     The patient does not have symptoms concerning for COVID-19 infection (fever, chills, cough, or new shortness of breath).   [name] is a 69 y.o. male with hypertension, CAD status post multiple PCI, stroke, CVA, diabetes mellitus type 2, hyperlipidemia, and prior tobacco abuse who presents for follow up.  [name] was referred by Dr. Billey Gosling on 02/09/16. Mr. Drone and PCI with a BMS to OM while living in [place].  He had in-stent restenosis in 2005 and had a DES placed.   At that time he reported occasional episodes of chest pain relieved by aspirin.  He denied exertional chest pain but did note dizziness and nausea.  Additionally, he was noted to have a left  bundle branch block that was not present on his last EKG in 2013. Therefore, he was referred for exercise Myoview 02/13/16 that revealed LVEF 39% with global hypokinesis but no ischemia.  Echo 03/01/16 revealed LV 40-45% with mild LVH and grade 2 diastolic dysfunction. There was also mild hypokinesis of the mid to apical anteroseptum.   He works with our pharmacists and has achieved very good blood pressure control.  He is in a program at the [place] that checks his BP daily.    [name] seen in the ED 06/2020 with a complaint of two months of chest pain, abdominal pain, back pain, and gas.  High-sensitivity troponin was elevated at 32.  EKG showed sinus rhythm with a left bundle branch block, which is chronic.  He left prior to receiving a follow-up lab because of COVID-19 related back up in the ED.  He followed up in the office a couple days later and high-sensitivity troponin was 124.  He was direct admitted to the hospital where he underwent cardiac catheterization.  His cath 8/19 revealed 95% stenosis of the ostial OM 2 just proximal to his prior OM stent.  A drug-eluting stent was successfully placed.  In follow-up he continued to be much better.  Chlorthalidone and losartan were held in the setting of his cath and his blood pressure was stable at follow-up so they were not resumed.  However lately his blood pressure has been more elevated and he has increasing shortness of breath and edema.  He also notes that his  face has been puffy.  He has no chest pain or pressure.  He does notes that he is more short of breath when walking and sometimes when laying down.  He had a repeat echo 07/2020 that revealed an improvement in his LVEF to 55% with grade 1 diastolic dysfunction.  Right atrial pressure was 3 mmHg.  At his last appointment losartan and lasix were added to his regimen.  It was recommended that he not start back chlorthalidone.  He saw his nephrologist, Dr. Joylene Grapes, who recommended that he start  chlortalidone in addition to lasix.  His BP has been in the 110s/70s.  He notes that since starting the chlorthalidone has noted some pain in his mid back towards the left.  He also has some R rib pain with movement.  His breathing has been stable.  He gets short of breath with exertion.  His edema has resolved.   He still has some orthopnea.  He notes that he snores and does not feel rested during the day.  He also notes daytime somnolence.  He had diarrhea and wasn't able to complete therapy due to concern for possible COVID-19.     Past Medical History:  Diagnosis Date  . ALLERGIC RHINITIS   . CAD, NATIVE VESSEL    BMS to OM1 2001, DES to BMS 2005  . Chronic back pain    herniated disc  . Chronic combined systolic and diastolic heart failure ([place]) 06/29/2016  . Constipation    takes Carafate four times day  . DIAB W/UNSPEC COMP TYPE II/UNSPEC TYPE UNCNTRL   . ERECTILE DYSFUNCTION   . GERD   . HYPERLIPIDEMIA-MIXED   . HYPERTENSION, BENIGN   . LBBB (left bundle branch block) 02/02/2016  . MORTON'S NEUROMA, RIGHT   . Peripheral neuropathy   . Seasonal allergies    takes Allegra and Benadryl daily prn;uses Flonase daily  . SHOULDER PAIN, RIGHT   . Snoring 09/09/2020  . 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 TIA on medication 02/2012  . Unstable angina ([place]) 07/09/2020    Past Surgical History:  Procedure Laterality Date  . ANGIOPLASTY    . COLONOSCOPY    . CORONARY ANGIOPLASTY  2005   2 stents  . CORONARY STENT INTERVENTION N/A 07/10/2020   Procedure: CORONARY STENT INTERVENTION;  Surgeon: Martinique, Peter M, MD;  Location: [facility];  Service: Cardiovascular;  Laterality: N/A;  . DENTAL SURGERY    . LARYNGOPLASTY  08/07/2012   Procedure: LARYNGOPLASTY;  Surgeon: Izora Gala, MD;  Location: [facility];  Service: ENT;  Laterality: Left;  Left Vocal Cord Medialyzation  . LEFT HEART CATH AND CORONARY ANGIOGRAPHY N/A 07/10/2020   Procedure: LEFT HEART CATH AND  CORONARY ANGIOGRAPHY;  Surgeon: Martinique, Peter M, MD;  Location: [facility];  Service: Cardiovascular;  Laterality: N/A;  . left knee surgury     x 2   . stent  2001, 2004   coronary stents     Current Outpatient Medications  Medication Sig Dispense Refill  . aspirin EC 81 MG tablet Take 1 tablet (81 mg total) by mouth daily. Swallow whole. 30 tablet 11  . atorvastatin (LIPITOR) 40 MG tablet Take 1 tablet (40 mg total) by mouth daily. 90 tablet 2  . Blood Glucose Monitoring Suppl (ONETOUCH VERIO) w/Device KIT USE METER TO CHECK GLUCOSE ONCE DAILY AS DIRECTED    . calcium carbonate (TUMS - DOSED IN MG ELEMENTAL CALCIUM)  500 MG chewable tablet Chew 1 tablet by mouth daily as needed for indigestion or heartburn.    . carvedilol (COREG) 12.5 MG tablet TAKE 1 & 1/2 (ONE & ONE-HALF) TABLETS BY MOUTH TWICE DAILY (Patient taking differently: Take 18.75 mg by mouth 2 (two) times daily with a meal. ) 270 tablet 3  . chlorthalidone (HYGROTON) 25 MG tablet Take 25 mg by mouth daily.    . clopidogrel (PLAVIX) 75 MG tablet Take 1 tablet (75 mg total) by mouth daily. Please schedule annual appt in April for refills. Thank you 90 tablet 3  . Dulaglutide (TRULICITY) 3.97 QB/3.4LP SOPN Inject 0.5 mLs (0.75 mg total) into the skin once a week. 6 mL 3  . furosemide (LASIX) 40 MG tablet Take 1 tablet (40 mg total) by mouth daily. 90 tablet 3  . glucose blood (ONETOUCH VERIO) test strip 1 each by Other route 2 (two) times daily. And lancets 2/day. 200 each 3  . Insulin Lispro Prot & Lispro (HUMALOG MIX 50/50 KWIKPEN) (50-50) 100 UNIT/ML Kwikpen Inject 32 Units into the skin daily with breakfast.    . losartan (COZAAR) 100 MG tablet Take 100 mg by mouth daily.    . nitroGLYCERIN (NITROSTAT) 0.4 MG SL tablet Place 1 tablet (0.4 mg total) under the tongue every 5 (five) minutes as needed for chest pain (up to 3 doses). 25 tablet 3  . OneTouch Delica Lancets 37T MISC 1 each by Does not apply route 2 (two) times  daily. Use to monitor glucose levels BID; E11.9 100 each 3  . RELION PEN NEEDLES 31G X 6 MM MISC USE 1  ONCE DAILY 50 each 0  . RELION PEN NEEDLES 32G X 4 MM MISC USE 1 ONCE DAILY AS DIRECTED  11  . senna-docusate (SENOKOT-S) 8.6-50 MG per tablet Take 2 tablets by mouth 2 (two) times daily. For constipation.    . simethicone (MYLICON) 024 MG chewable tablet Chew 125 mg by mouth every 6 (six) hours as needed for flatulence.     No current facility-administered medications for this visit.    Allergies:   Penicillins, Pneumococcal vaccines, Shellfish allergy, Influenza vaccines, Lisinopril, Topiramate, and Amlodipine    Social History:  The patient  reports that he quit smoking about 37 years ago. His smoking use included cigarettes. He has a 22.50 pack-year smoking history. He has never used smokeless tobacco. He reports that he does not drink alcohol and does not use drugs.   Family History:  The patient's family history includes Cancer in his mother.    ROS:  Please see the history of present illness.   Otherwise, review of systems are positive for L side weakness since his stroke and cough.  All other systems are reviewed and negative.    PHYSICAL EXAM: BP 115/75   Pulse 73   Ht _0  (1.803 m)   Wt 220 lb (99.8 kg)   BMI 30.68 kg/m  GENERAL: Well-appearing.  No acute distress. HEENT: Pupils equal round.  Oral mucosa unremarkable NECK:  No jugular venous distention, no visible thyromegaly EXT:  No edema, no cyanosis no clubbing SKIN:  No rashes no nodules NEURO:  Speech fluent.  Cranial nerves grossly intact.  Moves all 4 extremities freely PSYCH:  Cognitively intact, oriented to person place and time  EKG:  EKG is not ordered today. The ekg ordered 3//13/17 demonstrates sinus rhythm rate 64 bpm.  LBBB.   07/20/17: Sinus rhythm. Rate 74 bpm. Left bundle branch block. 09/01/18; Sinus  rhythm.  Rate 71 bpm.  LBBB 07/09/20: Sinus rhythm.  Rate 91 bpm.  LBBB. 07/16/20: Sinus rhythm.   Rate 80 bpm.  LBBB. 08/08/2020: Sinus rhythm.  Rate 74 bpm.  Left bundle branch block.  LHC 07/10/20:   2nd Mrg-1 lesion is 95% stenosed.  2nd Mrg-2 lesion is 30% stenosed.  RPDA lesion is 100% stenosed.  A drug-eluting stent was successfully placed using a STENT RESOLUTE ONYX 2.75X8.  Post intervention, there is a 0% residual stenosis.  Post intervention, there is a 0% residual stenosis.  LV end diastolic pressure is normal.   1. 2 vessel obstructive CAD    -95% ostial OM2. This was at the proximal margin of the old stents    - 100% distal PDA. This fills by left to right collaterals. 2. Normal LVEDP 3. Successful PCI of the ostium of the OM with DES x 1.  Plan: DAPT indefinitely given multiple stents in OM. Anticipate same day DC. Will hold losartan and HCTZ until renal function repeated as outpatient.    Echo 09/20/18: Study Conclusions   - Left ventricle: abnormal septal motion. The cavity size was  mildly dilated. Wall thickness was increased in a pattern of  severe LVH. Systolic function was mildly to moderately reduced.  The estimated ejection fraction was in the range of 40% to 45%.  Doppler parameters are consistent with abnormal left ventricular  relaxation (grade 1 diastolic dysfunction).  - Atrial septum: There was increased thickness of the septum,  consistent with lipomatous hypertrophy. No defect or patent  foramen ovale was identified.   Echo 07/2020: IMPRESSIONS   1. Left ventricular ejection fraction, by estimation, is 55%. The left  ventricle has normal function. The left ventricle has no regional wall  motion abnormalities but there was septal-lateral dyssynchrony consistent  with LBBB. Left ventricular diastolic  parameters are consistent with Grade I diastolic dysfunction (impaired  relaxation).  2. Right ventricular systolic function is normal. The right ventricular  size is normal. There is normal pulmonary artery systolic  pressure. The  estimated right ventricular systolic pressure is 19.1 mmHg.  3. The aortic valve is tricuspid. Aortic valve regurgitation is not  visualized. No aortic stenosis is present.  4. The mitral valve is normal in structure. Trivial mitral valve  regurgitation. No evidence of mitral stenosis.  5. The inferior vena cava is normal in size with greater than 50%  respiratory variability, suggesting right atrial pressure of 3 mmHg.   Carotid Doppler 10/24/14: Normal study  Lexiscan Myoview 10/2018:  Nuclear stress EF: 43%. The left ventricular ejection fraction is moderately decreased (30-44%).  Defect 1: There is a medium defect of moderate severity present in the mid anteroseptal, mid inferoseptal, apical septal and apex location. This septal defect is likely due to the LBBB.  This is an intermediate risk study. This study is similar to the previous study in 2017.  ABI 08/2018: Normal bilaterally  Recent Labs: 10/17/2019: TSH 2.08 07/07/2020: ALT 28 07/10/2020: Hemoglobin 9.7; Platelets 190 08/15/2020: BUN 24; Creatinine, Ser 1.88; Potassium 4.4; Sodium 144    Lipid Panel    Component Value Date/Time   CHOL 112 04/15/2020 1128   CHOL 164 07/20/2017 1202   TRIG 136.0 04/15/2020 1128   TRIG 200 04/20/2009 0000   HDL 44.60 04/15/2020 1128   HDL 55 07/20/2017 1202   CHOLHDL 3 04/15/2020 1128   VLDL 27.2 04/15/2020 1128   LDLCALC 40 04/15/2020 1128   LDLCALC 82 07/20/2017 1202   LDLDIRECT 63.0  01/12/2016 0931    Wt Readings from Last 3 Encounters:  09/09/20 220 lb (99.8 kg)  08/08/20 222 lb (100.7 kg)  08/08/20 221 lb 12.8 oz (100.6 kg)      ASSESSMENT AND PLAN:  # CAD s/p PCI x2: # NSTEMI:  Mr. Broady underwent successful PCI of OM 2.  He is feeling much better.  He will continue on indefinite dual antiplatelet therapy per Dr. Martinique.  Continue aspirin, clopidogrel, carvedilol, and atorvastatin.  # Chronic systolic and diastolic heart failure:  #  Hypertension: # CKD IV: Mr. Hataway's volume status has improved.  However he is now on two diuretics.  Check BMP, especially given his flank discomfort.  LVEF was 40 to 45% previously and improved to 55% post cath.  Continue carvedilol, chlorthalidone, furosemide, and losartan   # Hyperlipidemia: LDL 40 on 03/2020.  Continue atorvastatin.   # Snorning:  # Orthopnea:  # Daytime somnolence:  We will get a sleep study  Current medicines are reviewed at length with the patient today.  The patient does not have concerns regarding medicines.  The following changes have been made: No changes  Labs/ tests ordered today include:    Orders Placed This Encounter  Procedures  . Basic metabolic panel  . Split night study     Disposition:   FU with Caya Soberanis C. Oval Linsey, MD, [facility] in 4 months.  COVID-19 Education: The signs and symptoms of COVID-19 were discussed with the patient and how to seek care for testing (follow up with PCP or arrange E-visit).  The importance of social distancing was discussed today.  Time:   Today, I have spent 22 minutes with the patient with telehealth technology discussing the above problems.      Signed, Semiah Konczal C. Oval Linsey, MD, [facility]  09/09/2020 12:16 PM    [place]

## 2020-09-09 NOTE — Patient Instructions (Addendum)
Medication Instructions:  Your physician recommends that you continue on your current medications as directed. Please refer to the Current Medication list given to you today.  *If you need a refill on your cardiac medications before your next appointment, please call your pharmacy*  Lab Work: BMET [place] [address] [place]   If you have labs (blood work) drawn today and your tests are completely normal, you will receive your results only by: Marland Kitchen MyChart Message (if you have MyChart) OR . A paper copy in the mail If you have any lab test that is abnormal or we need to change your treatment, we will call you to review the results.  Testing/Procedures: Your physician has recommended that you have a sleep study. This test records several body functions during sleep, including: brain activity, eye movement, oxygen and carbon dioxide blood levels, heart rate and rhythm, breathing rate and rhythm, the flow of air through your mouth and nose, snoring, body muscle movements, and chest and belly movement. THE OFFICE WILL CALL TO SCHEDULE ONCE YOUR INSURANCE HAS APPROVED IF YOU DO NOT HEAR FROM THE OFFICE IN 2 WEEKS PLEASE   Follow-Up: At [facility], you and your health needs are our priority.  As part of our continuing mission to provide you with exceptional heart care, we have created designated Provider Care Teams.  These Care Teams include your primary Cardiologist (physician) and Advanced Practice Providers (APPs -  Physician Assistants and Nurse Practitioners) who all work together to provide you with the care you need, when you need it.  We recommend signing up for the patient portal called "MyChart".  Sign up information is provided on this After Visit Summary.  MyChart is used to connect with patients for Virtual Visits (Telemedicine).  Patients are able to view lab/test results, encounter notes, upcoming appointments, etc.  Non-urgent messages can be sent to your  provider as well.   To learn more about what you can do with MyChart, go to [email or web address].    Your next appointment:   01/20/2021 AT 1:40 PM

## 2020-09-10 ENCOUNTER — Ambulatory Visit (HOSPITAL_COMMUNITY): Payer: Medicare HMO

## 2020-09-11 LAB — BASIC METABOLIC PANEL
BUN/Creatinine Ratio: 18 (ref 10–24)
BUN: 41 mg/dL — ABNORMAL HIGH (ref 8–27)
CO2: 29 mmol/L (ref 20–29)
Calcium: 9.6 mg/dL (ref 8.6–10.2)
Chloride: 100 mmol/L (ref 96–106)
Creatinine, Ser: 2.29 mg/dL — ABNORMAL HIGH (ref 0.76–1.27)
GFR calc Af Amer: 32 mL/min/{1.73_m2} — ABNORMAL LOW (ref >59)
GFR calc non Af Amer: 28 mL/min/{1.73_m2} — ABNORMAL LOW (ref >59)
Glucose: 279 mg/dL — ABNORMAL HIGH (ref 65–99)
Potassium: 3.8 mmol/L (ref 3.5–5.2)
Sodium: 142 mmol/L (ref 134–144)

## 2020-09-12 ENCOUNTER — Ambulatory Visit (HOSPITAL_COMMUNITY): Payer: Medicare HMO

## 2020-09-12 ENCOUNTER — Ambulatory Visit (INDEPENDENT_AMBULATORY_CARE_PROVIDER_SITE_OTHER): Payer: Medicare HMO | Admitting: Podiatry

## 2020-09-12 ENCOUNTER — Other Ambulatory Visit: Payer: Self-pay

## 2020-09-12 ENCOUNTER — Encounter: Payer: Self-pay | Admitting: Podiatry

## 2020-09-12 DIAGNOSIS — B351 Tinea unguium: Secondary | ICD-10-CM

## 2020-09-12 DIAGNOSIS — E1142 Type 2 diabetes mellitus with diabetic polyneuropathy: Secondary | ICD-10-CM

## 2020-09-12 DIAGNOSIS — M792 Neuralgia and neuritis, unspecified: Secondary | ICD-10-CM

## 2020-09-12 DIAGNOSIS — M79675 Pain in left toe(s): Secondary | ICD-10-CM

## 2020-09-12 DIAGNOSIS — M79674 Pain in right toe(s): Secondary | ICD-10-CM

## 2020-09-12 NOTE — Progress Notes (Signed)
This patient returns to my office for at risk foot care.  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  Patient is taking plavix.  Patient has history of CVA.  This patient is unable to cut nails himself since the patient cannot reach his nails.These nails are painful walking and wearing shoes.  This patient presents for at risk foot care today.  General Appearance  Alert, conversant and in no acute stress.  Vascular  Dorsalis pedis and posterior tibial  pulses are palpable  bilaterally.  Capillary return is within normal limits  bilaterally. Temperature is within normal limits  bilaterally.  Neurologic  Senn-Weinstein monofilament wire test diminished  bilaterally. Muscle power within normal limits bilaterally.  Nails Thick disfigured discolored nails with subungual debris  from hallux to fifth toes bilaterally. No evidence of bacterial infection or drainage bilaterally.  Orthopedic  No limitations of motion  feet .  No crepitus or effusions noted.  No bony pathology or digital deformities noted.  Skin  normotropic skin with no porokeratosis noted bilaterally.  No signs of infections or ulcers noted.  Midfoot DJD right.  Hammer toes  B/L.   Onychomycosis  Pain in right toes  Pain in left toes  Consent was obtained for treatment procedures.   Mechanical debridement of nails 1-5  bilaterally performed with a nail nipper.  Filed with dremel without incident. Patient qualifies for diabetic shoes for DPN and HT and DJD.   Return office visit   3 months                   Told patient to return for periodic foot care and evaluation due to potential at risk complications.   Gardiner Barefoot DPM

## 2020-09-15 ENCOUNTER — Ambulatory Visit (HOSPITAL_COMMUNITY): Payer: Medicare HMO

## 2020-09-17 ENCOUNTER — Telehealth (HOSPITAL_COMMUNITY): Payer: Self-pay | Admitting: Internal Medicine

## 2020-09-17 ENCOUNTER — Telehealth: Payer: Self-pay | Admitting: *Deleted

## 2020-09-17 ENCOUNTER — Ambulatory Visit (HOSPITAL_COMMUNITY): Payer: Medicare HMO

## 2020-09-17 DIAGNOSIS — R0683 Snoring: Secondary | ICD-10-CM

## 2020-09-17 NOTE — Telephone Encounter (Signed)
-----   Message from Imagene Gurney sent at 09/17/2020 12:03 PM EDT ----- [name], can we do a Home Sleep Test instead? ----- Message ----- From: Earvin Hansen, LPN Sent: 29/93/[id number]  11:19 AM EDT To: Earvin Hansen, LPN, [facility] Sleep Studies  Hello all Patient needs sleep study Thanks [place]

## 2020-09-17 NOTE — Telephone Encounter (Signed)
Ok per Dr Oval Linsey, order placed

## 2020-09-19 ENCOUNTER — Ambulatory Visit (HOSPITAL_COMMUNITY): Payer: Medicare HMO

## 2020-09-22 ENCOUNTER — Ambulatory Visit (HOSPITAL_COMMUNITY): Payer: Medicare HMO

## 2020-09-24 ENCOUNTER — Ambulatory Visit (HOSPITAL_COMMUNITY): Payer: Medicare HMO

## 2020-09-26 ENCOUNTER — Ambulatory Visit (HOSPITAL_COMMUNITY): Payer: Medicare HMO

## 2020-09-29 ENCOUNTER — Encounter (HOSPITAL_COMMUNITY): Payer: Self-pay

## 2020-09-29 ENCOUNTER — Telehealth (HOSPITAL_COMMUNITY): Payer: Self-pay

## 2020-09-29 ENCOUNTER — Ambulatory Visit (HOSPITAL_COMMUNITY): Payer: Medicare HMO

## 2020-09-29 NOTE — Telephone Encounter (Signed)
Attempted to call patient in regards to Cardiac Rehab - unable to leave VM.   Mailed letter 

## 2020-10-01 ENCOUNTER — Ambulatory Visit (HOSPITAL_COMMUNITY): Payer: Medicare HMO

## 2020-10-03 ENCOUNTER — Ambulatory Visit (HOSPITAL_COMMUNITY): Payer: Medicare HMO

## 2020-10-05 NOTE — Patient Instructions (Addendum)
  Medications reviewed and updated.  Changes include :   none     Please followup in 1 year  

## 2020-10-05 NOTE — Progress Notes (Signed)
Subjective:    Patient ID: [name], male    DOB: [birth year], 69 y.o.   MRN: [id number]  HPI He is here for physical exam and chronic medical problems, including htn, CAD, combined HF, DM, hyperlipidemia, h/o cva w/ residual left sided weakness, CKD  He is not exercising regularly.   He feels he is eating pretty good.   Overall he feels he is doing okay.  He does feel better since having stents placed in his heart few months ago.   Medications and allergies reviewed with patient and updated if appropriate.  Patient Active Problem List   Diagnosis Date Noted  . Snoring 09/09/2020  . Unstable angina ([place]) 07/09/2020  . Sciatica of left side 07/11/2019  . Cough 07/11/2019  . Edema leg 10/26/2018  . History of stroke 10/26/2018  . DOE (dyspnea on exertion) 10/26/2018  . Left hip pain 07/14/2018  . Acute left ankle pain 07/14/2018  . Chest pain 07/14/2018  . Nonintractable headache 02/24/2018  . CRI (chronic renal insufficiency), stage 3 (moderate) 02/22/2017  . Anemia 02/16/2017  . Chronic combined systolic and diastolic heart failure ([place]) 06/29/2016  . LBBB (left bundle branch block) 02/02/2016  . Nonallopathic lesion of lumbosacral region 12/11/2014  . Nonallopathic lesion of sacral region 12/11/2014  . Nonallopathic lesion of thoracic region 12/11/2014  . Arthritis of left hip 11/20/2014  . Ischial bursitis of left side 10/28/2014  . Hamstring tightness of left lower extremity 09/16/2014  . Piriformis syndrome of left side 09/16/2014  . Dysphonia 06/22/2013  . Hemiparesis affecting left side as late effect of stroke ([place]) 10/10/2012  . Dysphagia following cerebrovascular accident 08/14/2012  . Chronic back pain   . Peripheral neuropathy ([place])   . TIA (transient ischemic attack) 04/08/2012  . Stroke ([place]) 02/21/2012  . [place], RIGHT 05/29/2010  . Diabetes ([place]) 04/13/2010  . ALLERGIC RHINITIS 04/13/2010  . GERD 04/13/2010  . ERECTILE  DYSFUNCTION 03/12/2009  . Essential hypertension 03/12/2009  . CAD S/P percutaneous coronary angioplasty 03/12/2009  . Hyperlipidemia 03/07/2009    Current Outpatient Medications on File Prior to Visit  Medication Sig Dispense Refill  . aspirin EC 81 MG tablet Take 1 tablet (81 mg total) by mouth daily. Swallow whole. 30 tablet 11  . atorvastatin (LIPITOR) 40 MG tablet Take 1 tablet (40 mg total) by mouth daily. 90 tablet 2  . Blood Glucose Monitoring Suppl (ONETOUCH VERIO) w/Device KIT USE METER TO CHECK GLUCOSE ONCE DAILY AS DIRECTED    . calcium carbonate (TUMS - DOSED IN MG ELEMENTAL CALCIUM) 500 MG chewable tablet Chew 1 tablet by mouth daily as needed for indigestion or heartburn.    . chlorthalidone (HYGROTON) 25 MG tablet Take 25 mg by mouth daily.    . clopidogrel (PLAVIX) 75 MG tablet Take 1 tablet (75 mg total) by mouth daily. Please schedule annual appt in April for refills. Thank you 90 tablet 3  . Dulaglutide (TRULICITY) 4.43 XV/4.0GQ SOPN Inject 0.5 mLs (0.75 mg total) into the skin once a week. 6 mL 3  . furosemide (LASIX) 40 MG tablet Take 1 tablet (40 mg total) by mouth daily. 90 tablet 3  . glucose blood (ONETOUCH VERIO) test strip 1 each by Other route 2 (two) times daily. And lancets 2/day. 200 each 3  . Insulin Lispro Prot & Lispro (HUMALOG MIX 50/50 KWIKPEN) (50-50) 100 UNIT/ML Kwikpen Inject 32 Units into the skin daily with breakfast.    . losartan (COZAAR) 100 MG tablet Take  100 mg by mouth daily.    . nitroGLYCERIN (NITROSTAT) 0.4 MG SL tablet Place 1 tablet (0.4 mg total) under the tongue every 5 (five) minutes as needed for chest pain (up to 3 doses). 25 tablet 3  . OneTouch Delica Lancets 00Q MISC 1 each by Does not apply route 2 (two) times daily. Use to monitor glucose levels BID; E11.9 100 each 3  . RELION PEN NEEDLES 31G X 6 MM MISC USE 1  ONCE DAILY 50 each 0  . senna-docusate (SENOKOT-S) 8.6-50 MG per tablet Take 2 tablets by mouth 2 (two) times daily. For  constipation.    . simethicone (MYLICON) 676 MG chewable tablet Chew 125 mg by mouth every 6 (six) hours as needed for flatulence.     No current facility-administered medications on file prior to visit.    Past Medical History:  Diagnosis Date  . ALLERGIC RHINITIS   . CAD, NATIVE VESSEL    BMS to OM1 2001, DES to BMS 2005  . Chronic back pain    herniated disc  . Chronic combined systolic and diastolic heart failure ([place]) 06/29/2016  . Constipation    takes Carafate four times day  . DIAB W/UNSPEC COMP TYPE II/UNSPEC TYPE UNCNTRL   . ERECTILE DYSFUNCTION   . GERD   . HYPERLIPIDEMIA-MIXED   . HYPERTENSION, BENIGN   . LBBB (left bundle branch block) 02/02/2016  . MORTON'S NEUROMA, RIGHT   . Peripheral neuropathy   . Seasonal allergies    takes Allegra and Benadryl daily prn;uses Flonase daily  . SHOULDER PAIN, RIGHT   . Snoring 09/09/2020  . Stroke, thrombotic ([place]) 07/2012   L HP + hemiparesis, s/p CIR   . TIA on medication 02/2012  . Unstable angina ([place]) 07/09/2020    Past Surgical History:  Procedure Laterality Date  . ANGIOPLASTY    . COLONOSCOPY    . CORONARY ANGIOPLASTY  2005   2 stents  . CORONARY STENT INTERVENTION N/A 07/10/2020   Procedure: CORONARY STENT INTERVENTION;  Surgeon: Martinique, Peter M, MD;  Location: [facility];  Service: Cardiovascular;  Laterality: N/A;  . DENTAL SURGERY    . LARYNGOPLASTY  08/07/2012   Procedure: LARYNGOPLASTY;  Surgeon: Izora Gala, MD;  Location: [facility];  Service: ENT;  Laterality: Left;  Left Vocal Cord Medialyzation  . LEFT HEART CATH AND CORONARY ANGIOGRAPHY N/A 07/10/2020   Procedure: LEFT HEART CATH AND CORONARY ANGIOGRAPHY;  Surgeon: Martinique, Peter M, MD;  Location: [facility];  Service: Cardiovascular;  Laterality: N/A;  . left knee surgury     x 2   . stent  2001, 2004   coronary stents    Social History   Socioeconomic History  . Marital status: Legally Separated    Spouse name: Not on file  . Number of  children: 3  . Years of education: college  . Highest education level: Not on file  Occupational History  . Occupation: disabled    Employer: [employer]  Tobacco Use  . Smoking status: Former Smoker    Packs/day: 2.50    Years: 9.00    Pack years: 22.50    Types: Cigarettes    Quit date: 02/24/1983    Years since quitting: 37.6  . Smokeless tobacco: Never Used  Vaping Use  . Vaping Use: Never used  Substance and Sexual Activity  . Alcohol use: No    Alcohol/week: 0.0 standard drinks  . Drug use: No  . Sexual activity: Never  Other Topics Concern  . Not on file  Social History Narrative      Social Determinants of Health   Financial Resource Strain: Low Risk   . Difficulty of Paying Living Expenses: Not very hard  Food Insecurity:   . Worried About Charity fundraiser in the Last Year: Not on file  . Ran Out of Food in the Last Year: Not on file  Transportation Needs:   . Lack of Transportation (Medical): Not on file  . Lack of Transportation (Non-Medical): Not on file  Physical Activity: Sufficiently Active  . Days of Exercise per Week: 3 days  . Minutes of Exercise per Session: 60 min  Stress:   . Feeling of Stress : Not on file  Social Connections:   . Frequency of Communication with Friends and Family: Not on file  . Frequency of Social Gatherings with Friends and Family: Not on file  . Attends Religious Services: Not on file  . Active Member of Clubs or Organizations: Not on file  . Attends Archivist Meetings: Not on file  . Marital Status: Not on file    Family History  Problem Relation Age of Onset  . Cancer Mother     Review of Systems  Constitutional: Negative for chills and fever.  Eyes: Negative for visual disturbance.  Respiratory: Positive for shortness of breath (long walks). Negative for cough and wheezing.   Cardiovascular: Negative for chest pain, palpitations and leg swelling.  Gastrointestinal: Negative for abdominal pain, blood  in stool, constipation, diarrhea and nausea.  Genitourinary: Negative for difficulty urinating, dysuria and hematuria.  Musculoskeletal: Positive for back pain (left lower back).  Skin: Negative for rash.  Neurological: Positive for light-headedness. Negative for headaches.  Psychiatric/Behavioral: Negative for dysphoric mood. The patient is not nervous/anxious.        Objective:   Vitals:   10/07/20 1040  BP: 116/78  Pulse: 78  Temp: 98.2 F (36.8 C)  SpO2: 98%   BP Readings from Last 3 Encounters:  10/07/20 116/78  09/09/20 115/75  08/08/20 (!) 142/80   Wt Readings from Last 3 Encounters:  10/07/20 212 lb (96.2 kg)  09/09/20 220 lb (99.8 kg)  08/08/20 222 lb (100.7 kg)   Body mass index is 29.57 kg/m.   Physical Exam    Constitutional: He appears well-developed and well-nourished. No distress.  HENT:  Head: Normocephalic and atraumatic.  Right Ear: External ear normal.  Left Ear: External ear normal.  Mouth/Throat: Oropharynx is clear and moist.  Normal ear canals and TM b/l  Eyes: Conjunctivae and EOM are normal.  Neck: Neck supple. No tracheal deviation present. No thyromegaly present.  No carotid bruit  Cardiovascular: Normal rate, regular rhythm, normal heart sounds and intact distal pulses.   2/6 systolic murmur heard. Pulmonary/Chest: Effort normal and breath sounds normal. No respiratory distress. He has no wheezes. He has no rales.  Abdominal: Soft. He exhibits no distension. There is no tenderness.  Musculoskeletal: He exhibits no edema.  Lymphadenopathy:   He has no cervical adenopathy.  Skin: Skin is warm and dry. He is not diaphoretic.  Psychiatric: He has a normal mood and affect. His behavior is normal.       Assessment & Plan:   Physical exam: Screening blood work   deferred.  Reviewed blood work over the past few months Immunizations discussed Shingrix-he deferred Cologuard-due-we will have sent to him Eye exams due in  January- Exercise   none at this time, but  will start soon-has been working to get reestablished Weight   would benefit from some weight loss.  He will try to start exercising regularly Substance abuse   none   See Problem List for Assessment and Plan of chronic medical problems.    This visit occurred during the SARS-CoV-2 public health emergency.  Safety protocols were in place, including screening questions prior to the visit, additional usage of staff PPE, and extensive cleaning of exam room while observing appropriate contact time as indicated for disinfecting solutions.

## 2020-10-06 ENCOUNTER — Ambulatory Visit (HOSPITAL_COMMUNITY): Payer: Medicare HMO

## 2020-10-07 ENCOUNTER — Ambulatory Visit (INDEPENDENT_AMBULATORY_CARE_PROVIDER_SITE_OTHER): Payer: Medicare HMO | Admitting: Internal Medicine

## 2020-10-07 ENCOUNTER — Encounter: Payer: Self-pay | Admitting: Internal Medicine

## 2020-10-07 ENCOUNTER — Other Ambulatory Visit: Payer: Self-pay

## 2020-10-07 ENCOUNTER — Ambulatory Visit (INDEPENDENT_AMBULATORY_CARE_PROVIDER_SITE_OTHER): Payer: Medicare HMO | Admitting: Endocrinology

## 2020-10-07 VITALS — BP 132/78 | HR 82 | Ht 71.0 in | Wt 213.0 lb

## 2020-10-07 VITALS — BP 116/78 | HR 78 | Temp 98.2°F | Ht 71.0 in | Wt 212.0 lb

## 2020-10-07 DIAGNOSIS — N1832 Chronic kidney disease, stage 3b: Secondary | ICD-10-CM

## 2020-10-07 DIAGNOSIS — I69354 Hemiplegia and hemiparesis following cerebral infarction affecting left non-dominant side: Secondary | ICD-10-CM

## 2020-10-07 DIAGNOSIS — E7849 Other hyperlipidemia: Secondary | ICD-10-CM

## 2020-10-07 DIAGNOSIS — I10 Essential (primary) hypertension: Secondary | ICD-10-CM | POA: Diagnosis not present

## 2020-10-07 DIAGNOSIS — Z794 Long term (current) use of insulin: Secondary | ICD-10-CM

## 2020-10-07 DIAGNOSIS — Z0000 Encounter for general adult medical examination without abnormal findings: Secondary | ICD-10-CM | POA: Diagnosis not present

## 2020-10-07 DIAGNOSIS — I5042 Chronic combined systolic (congestive) and diastolic (congestive) heart failure: Secondary | ICD-10-CM

## 2020-10-07 DIAGNOSIS — Z9861 Coronary angioplasty status: Secondary | ICD-10-CM

## 2020-10-07 DIAGNOSIS — I2510 Atherosclerotic heart disease of native coronary artery without angina pectoris: Secondary | ICD-10-CM

## 2020-10-07 DIAGNOSIS — E1122 Type 2 diabetes mellitus with diabetic chronic kidney disease: Secondary | ICD-10-CM | POA: Diagnosis not present

## 2020-10-07 DIAGNOSIS — N183 Chronic kidney disease, stage 3 unspecified: Secondary | ICD-10-CM

## 2020-10-07 LAB — POCT GLYCOSYLATED HEMOGLOBIN (HGB A1C): Hemoglobin A1C: 7.4 % — AB (ref 4.0–5.6)

## 2020-10-07 MED ORDER — CARVEDILOL 12.5 MG PO TABS
ORAL_TABLET | ORAL | 3 refills | Status: DC
Start: 2020-10-07 — End: 2020-12-08

## 2020-10-07 NOTE — Assessment & Plan Note (Signed)
Chronic Had 2 stents placed in August 2021-does feel better since then Following with cardiology and up-to-date with visits Continue current medication

## 2020-10-07 NOTE — Assessment & Plan Note (Signed)
Chronic Following with nephrology-has an upcoming appointment

## 2020-10-07 NOTE — Assessment & Plan Note (Signed)
Chronic Euvolemic on exam Following with cardiology-management per them

## 2020-10-07 NOTE — Assessment & Plan Note (Signed)
Chronic Blood pressure well controlled Lipids at goal Continue statin, Plavix and current blood pressure medications Stressed regular exercise

## 2020-10-07 NOTE — Assessment & Plan Note (Signed)
Chronic Management per Dr. Carilyn Goodpasture has an upcoming appointment Encouraged him to start regular exercise

## 2020-10-07 NOTE — Assessment & Plan Note (Signed)
Chronic Managed by cardiology Continue atorvastatin 40 mg daily

## 2020-10-07 NOTE — Patient Instructions (Addendum)
Please continue the same Trulicity and insulin On this type of insulin schedule, you should eat meals on a regular schedule (especially breakfast and lunch).  If a meal is missed or significantly delayed, your blood sugar could go low.   Please come back for a follow-up appointment in 3 months.   check your blood sugar twice a day.  vary the time of day when you check, between before the 3 meals, and at bedtime.  also check if you have symptoms of your blood sugar being too high or too low.  please keep a record of the readings and bring it to your next appointment here (or you can bring the meter itself).  You can write it on any piece of paper.  please call us sooner if your blood sugar goes below 70, or if you have a lot of readings over 200.

## 2020-10-07 NOTE — Assessment & Plan Note (Signed)
Chronic BP well controlled Continue current medications at current doses

## 2020-10-07 NOTE — Progress Notes (Signed)
Subjective:    Patient ID: [name], male    DOB: 1951/10/24, 69 y.o.   MRN: [id number]  HPI Pt returns for f/u of diabetes mellitus:  DM type: Insulin-requiring type 2 Dx'ed: 2637 Complications: PN, CAD, CRI, DR, and TIA.   Therapy: insulin since 8588, and Trulicity.   DKA: never.   Severe hypoglycemia: last episode was early 2020.   Pancreatitis: never.  SDOH: he gets meds from [facility]--pt assistance.   Other: He says he cannot use syringe and vial, due to tremor; he changed lantus to NPH, then 70/30, then 50/50, due to pattern on cbg's; he declines multiple daily injections;   Trulicity dosage is limited by cough and sob.  Interval history: He says he never misses the meds.  no cbg record, but states cbg's vary from 130-200.  There is no trend throughout the day.  pt states he feels well in general.  No recent steroids.   Past Medical History:  Diagnosis Date  . ALLERGIC RHINITIS   . CAD, NATIVE VESSEL    BMS to OM1 2001, DES to BMS 2005  . Chronic back pain    herniated disc  . Chronic combined systolic and diastolic heart failure ([place]) 06/29/2016  . Constipation    takes Carafate four times day  . DIAB W/UNSPEC COMP TYPE II/UNSPEC TYPE UNCNTRL   . ERECTILE DYSFUNCTION   . GERD   . HYPERLIPIDEMIA-MIXED   . HYPERTENSION, BENIGN   . LBBB (left bundle branch block) 02/02/2016  . MORTON'S NEUROMA, RIGHT   . Peripheral neuropathy   . Seasonal allergies    takes Allegra and Benadryl daily prn;uses Flonase daily  . SHOULDER PAIN, RIGHT   . Snoring 09/09/2020  . Stroke, thrombotic ([place]) 07/2012   L HP + hemiparesis, s/p CIR   . TIA on medication 02/2012  . Unstable angina ([place]) 07/09/2020    Past Surgical History:  Procedure Laterality Date  . ANGIOPLASTY    . COLONOSCOPY    . CORONARY ANGIOPLASTY  2005   2 stents  . CORONARY STENT INTERVENTION N/A 07/10/2020   Procedure: CORONARY STENT INTERVENTION;  Surgeon: Martinique, Peter M, MD;  Location: [facility];   Service: Cardiovascular;  Laterality: N/A;  . DENTAL SURGERY    . LARYNGOPLASTY  08/07/2012   Procedure: LARYNGOPLASTY;  Surgeon: Izora Gala, MD;  Location: [facility];  Service: ENT;  Laterality: Left;  Left Vocal Cord Medialyzation  . LEFT HEART CATH AND CORONARY ANGIOGRAPHY N/A 07/10/2020   Procedure: LEFT HEART CATH AND CORONARY ANGIOGRAPHY;  Surgeon: Martinique, Peter M, MD;  Location: [facility];  Service: Cardiovascular;  Laterality: N/A;  . left knee surgury     x 2   . stent  2001, 2004   coronary stents    Social History   Socioeconomic History  . Marital status: Legally Separated    Spouse name: Not on file  . Number of children: 3  . Years of education: college  . Highest education level: Not on file  Occupational History  . Occupation: disabled    Employer: [employer]  Tobacco Use  . Smoking status: Former Smoker    Packs/day: 2.50    Years: 9.00    Pack years: 22.50    Types: Cigarettes    Quit date: 02/24/1983    Years since quitting: 37.6  . Smokeless tobacco: Never Used  Vaping Use  . Vaping Use: Never used  Substance and Sexual Activity  . Alcohol use: No  Alcohol/week: 0.0 standard drinks  . Drug use: No  . Sexual activity: Never  Other Topics Concern  . Not on file  Social History Narrative      Social Determinants of Health   Financial Resource Strain: Low Risk   . Difficulty of Paying Living Expenses: Not very hard  Food Insecurity:   . Worried About Charity fundraiser in the Last Year: Not on file  . Ran Out of Food in the Last Year: Not on file  Transportation Needs:   . Lack of Transportation (Medical): Not on file  . Lack of Transportation (Non-Medical): Not on file  Physical Activity: Sufficiently Active  . Days of Exercise per Week: 3 days  . Minutes of Exercise per Session: 60 min  Stress:   . Feeling of Stress : Not on file  Social Connections:   . Frequency of Communication with Friends and Family: Not on file  . Frequency of  Social Gatherings with Friends and Family: Not on file  . Attends Religious Services: Not on file  . Active Member of Clubs or Organizations: Not on file  . Attends Archivist Meetings: Not on file  . Marital Status: Not on file  Intimate Partner Violence:   . Fear of Current or Ex-Partner: Not on file  . Emotionally Abused: Not on file  . Physically Abused: Not on file  . Sexually Abused: Not on file    Current Outpatient Medications on File Prior to Visit  Medication Sig Dispense Refill  . aspirin EC 81 MG tablet Take 1 tablet (81 mg total) by mouth daily. Swallow whole. 30 tablet 11  . atorvastatin (LIPITOR) 40 MG tablet Take 1 tablet (40 mg total) by mouth daily. 90 tablet 2  . Blood Glucose Monitoring Suppl (ONETOUCH VERIO) w/Device KIT USE METER TO CHECK GLUCOSE ONCE DAILY AS DIRECTED    . calcium carbonate (TUMS - DOSED IN MG ELEMENTAL CALCIUM) 500 MG chewable tablet Chew 1 tablet by mouth daily as needed for indigestion or heartburn.    . carvedilol (COREG) 12.5 MG tablet Taking 25 mg in morning and 12.5 mg in evening 270 tablet 3  . chlorthalidone (HYGROTON) 25 MG tablet Take 25 mg by mouth daily.    . clopidogrel (PLAVIX) 75 MG tablet Take 1 tablet (75 mg total) by mouth daily. Please schedule annual appt in April for refills. Thank you 90 tablet 3  . Dulaglutide (TRULICITY) 2.70 WC/3.7SE SOPN Inject 0.5 mLs (0.75 mg total) into the skin once a week. 6 mL 3  . furosemide (LASIX) 40 MG tablet Take 1 tablet (40 mg total) by mouth daily. 90 tablet 3  . glucose blood (ONETOUCH VERIO) test strip 1 each by Other route 2 (two) times daily. And lancets 2/day. 200 each 3  . Insulin Lispro Prot & Lispro (HUMALOG MIX 50/50 KWIKPEN) (50-50) 100 UNIT/ML Kwikpen Inject 32 Units into the skin daily with breakfast.    . losartan (COZAAR) 100 MG tablet Take 100 mg by mouth daily.    . nitroGLYCERIN (NITROSTAT) 0.4 MG SL tablet Place 1 tablet (0.4 mg total) under the tongue every 5 (five)  minutes as needed for chest pain (up to 3 doses). 25 tablet 3  . OneTouch Delica Lancets 83T MISC 1 each by Does not apply route 2 (two) times daily. Use to monitor glucose levels BID; E11.9 100 each 3  . RELION PEN NEEDLES 31G X 6 MM MISC USE 1  ONCE DAILY 50 each 0  .  senna-docusate (SENOKOT-S) 8.6-50 MG per tablet Take 2 tablets by mouth 2 (two) times daily. For constipation.    . simethicone (MYLICON) 161 MG chewable tablet Chew 125 mg by mouth every 6 (six) hours as needed for flatulence.     No current facility-administered medications on file prior to visit.    Allergies  Allergen Reactions  . Penicillins Anaphylaxis  . Pneumococcal Vaccines Anaphylaxis  . Shellfish Allergy Anaphylaxis  . Influenza Vaccines   . Lisinopril Cough  . Topiramate Other (See Comments)    Chest spasms and numbness  . Amlodipine Other (See Comments)    LE edema    Family History  Problem Relation Age of Onset  . Cancer Mother     BP 132/78   Pulse 82   Ht 5' 11"  (1.803 m)   Wt 213 lb (96.6 kg)   SpO2 97%   BMI 29.71 kg/m    Review of Systems He denies hypoglycemia.      Objective:   Physical Exam VITAL SIGNS:  See vs page GENERAL: no distress Pulses: dorsalis pedis intact bilat.   MSK: no deformity of the feet CV: no leg edema Skin:  no ulcer on the feet.  normal color and temp on the feet. Neuro: sensation is intact to touch on the feet  Lab Results  Component Value Date   HGBA1C 7.4 (A) 10/07/2020       Assessment & Plan:  Insulin-requiring type 2 DM, with CRI: this is the best control this pt should aim for, given this regimen, which does match insulin to his changing needs throughout the day.   Patient Instructions  Please continue the same Trulicity and insulin On this type of insulin schedule, you should eat meals on a regular schedule (especially breakfast and lunch).  If a meal is missed or significantly delayed, your blood sugar could go low.   Please come back for  a follow-up appointment in 3 months.   check your blood sugar twice a day.  vary the time of day when you check, between before the 3 meals, and at bedtime.  also check if you have symptoms of your blood sugar being too high or too low.  please keep a record of the readings and bring it to your next appointment here (or you can bring the meter itself).  You can write it on any piece of paper.  please call us sooner if your blood sugar goes below 70, or if you have a lot of readings over 200.

## 2020-10-08 ENCOUNTER — Ambulatory Visit (HOSPITAL_COMMUNITY): Payer: Medicare HMO

## 2020-10-10 ENCOUNTER — Ambulatory Visit (HOSPITAL_COMMUNITY): Payer: Medicare HMO

## 2020-10-13 ENCOUNTER — Ambulatory Visit (HOSPITAL_COMMUNITY): Payer: Medicare HMO

## 2020-10-14 ENCOUNTER — Ambulatory Visit: Payer: Medicare HMO | Attending: Internal Medicine

## 2020-10-14 ENCOUNTER — Telehealth (HOSPITAL_COMMUNITY): Payer: Self-pay

## 2020-10-14 ENCOUNTER — Ambulatory Visit (INDEPENDENT_AMBULATORY_CARE_PROVIDER_SITE_OTHER): Payer: Medicare HMO | Admitting: Pulmonary Disease

## 2020-10-14 ENCOUNTER — Other Ambulatory Visit: Payer: Self-pay

## 2020-10-14 ENCOUNTER — Ambulatory Visit: Payer: Medicare HMO | Admitting: Cardiovascular Disease

## 2020-10-14 DIAGNOSIS — Z1211 Encounter for screening for malignant neoplasm of colon: Secondary | ICD-10-CM

## 2020-10-14 DIAGNOSIS — R0602 Shortness of breath: Secondary | ICD-10-CM

## 2020-10-14 DIAGNOSIS — Z23 Encounter for immunization: Secondary | ICD-10-CM

## 2020-10-14 LAB — PULMONARY FUNCTION TEST
DL/VA % pred: 118 %
DL/VA: 4.76 ml/min/mmHg/L
DLCO unc % pred: 73 %
DLCO unc: 19.71 ml/min/mmHg
FEF 25-75 Post: 2.86 L/sec
FEF 25-75 Pre: 2.56 L/sec
FEF2575-%Change-Post: 11 %
FEF2575-%Pred-Post: 110 %
FEF2575-%Pred-Pre: 99 %
FEV1-%Change-Post: 3 %
FEV1-%Pred-Post: 71 %
FEV1-%Pred-Pre: 68 %
FEV1-Post: 2.15 L
FEV1-Pre: 2.07 L
FEV1FVC-%Change-Post: 0 %
FEV1FVC-%Pred-Pre: 110 %
FEV6-%Change-Post: 3 %
FEV6-%Pred-Post: 66 %
FEV6-%Pred-Pre: 64 %
FEV6-Post: 2.54 L
FEV6-Pre: 2.45 L
FEV6FVC-%Pred-Post: 104 %
FEV6FVC-%Pred-Pre: 104 %
FVC-%Change-Post: 3 %
FVC-%Pred-Post: 63 %
FVC-%Pred-Pre: 61 %
FVC-Post: 2.54 L
FVC-Pre: 2.45 L
Post FEV1/FVC ratio: 85 %
Post FEV6/FVC ratio: 100 %
Pre FEV1/FVC ratio: 84 %
Pre FEV6/FVC Ratio: 100 %
RV % pred: 47 %
RV: 1.18 L
TLC % pred: 59 %
TLC: 4.28 L

## 2020-10-14 NOTE — Progress Notes (Signed)
Full PFT performed today. °

## 2020-10-14 NOTE — Progress Notes (Signed)
   Covid-19 Vaccination Clinic  Name:  Shain Pauwels    MRN: [id number] DOB: Jan 23, 1951  10/14/2020  Mr. Morken was observed post Covid-19 immunization for 30 minutes based on pre-vaccination screening without incident. He was provided with Vaccine Information Sheet and instruction to access the V-Safe system.   Mr. Liou was instructed to call 911 with any severe reactions post vaccine: Marland Kitchen Difficulty breathing  . Swelling of face and throat  . A fast heartbeat  . A bad rash all over body  . Dizziness and weakness   Immunizations Administered    Name Date Dose VIS Date Route   Pfizer COVID-19 Vaccine 10/14/2020  1:18 PM 0.3 mL 09/10/2020 Intramuscular   Manufacturer: [place]   Lot: [id number]   [place]: [id number]

## 2020-10-14 NOTE — Telephone Encounter (Signed)
No response from pt.  Closed referral  

## 2020-10-15 ENCOUNTER — Ambulatory Visit: Payer: Medicare HMO | Admitting: Orthotics

## 2020-10-15 ENCOUNTER — Ambulatory Visit (HOSPITAL_COMMUNITY): Payer: Medicare HMO

## 2020-10-15 DIAGNOSIS — E1142 Type 2 diabetes mellitus with diabetic polyneuropathy: Secondary | ICD-10-CM

## 2020-10-15 NOTE — Progress Notes (Signed)
Patient is being seen by Dr. Renato Shin for DM2 management; dr. Loanne Drilling already had told patient he didn't think he qualifies for DM shoes;  Patient advised we could not cast/fit for DM shoes w/o Dr. Loanne Drilling approval.   Advised to go to [facility] and see fi they can get him PO for [employer].

## 2020-10-17 ENCOUNTER — Ambulatory Visit (HOSPITAL_COMMUNITY): Payer: Medicare HMO

## 2020-10-20 ENCOUNTER — Ambulatory Visit (HOSPITAL_COMMUNITY): Payer: Medicare HMO

## 2020-10-20 ENCOUNTER — Other Ambulatory Visit: Payer: Self-pay

## 2020-10-20 ENCOUNTER — Telehealth: Payer: Self-pay | Admitting: *Deleted

## 2020-10-20 ENCOUNTER — Ambulatory Visit (HOSPITAL_BASED_OUTPATIENT_CLINIC_OR_DEPARTMENT_OTHER): Payer: Medicare HMO

## 2020-10-20 DIAGNOSIS — R400 Somnolence: Secondary | ICD-10-CM

## 2020-10-20 DIAGNOSIS — R0683 Snoring: Secondary | ICD-10-CM

## 2020-10-20 DIAGNOSIS — R0601 Orthopnea: Secondary | ICD-10-CM

## 2020-10-22 ENCOUNTER — Ambulatory Visit (HOSPITAL_COMMUNITY): Payer: Medicare HMO

## 2020-10-24 ENCOUNTER — Ambulatory Visit (HOSPITAL_COMMUNITY): Payer: Medicare HMO

## 2020-10-24 NOTE — Telephone Encounter (Signed)
RE: Home sleep / split night  [place], Scarlette Shorts, CMA Split night was not approved    RE: precert [place] Clayton Lefort, CMA Patient was just scheduled for a HST on November 29.          ----- Message -----  From: Freada Bergeron, CMA  Sent: 10/20/2020 10:57 AM EST  To: [facility]  Subject: precert                      Split night history of stroke (CVA)    From: Freada Bergeron, CMA  Sent: 10/20/2020 10:49 AM EST  To: Imagene Gurney  Subject: Home sleep / split night             Hello good morning, Vernon at sleep lab says this person is not a good candidate for a home sleep test because he has a history of stoke. Lynnae Sandhoff suggest a split night. Did he get denied before and that's why you changed to a home sleep test?

## 2020-10-27 ENCOUNTER — Ambulatory Visit (HOSPITAL_COMMUNITY): Payer: Medicare HMO

## 2020-10-29 ENCOUNTER — Ambulatory Visit (HOSPITAL_COMMUNITY): Payer: Medicare HMO

## 2020-10-29 NOTE — Telephone Encounter (Signed)
12/07 1:52 pm PER LEAH  no precert required per [employer].

## 2020-10-31 ENCOUNTER — Ambulatory Visit (HOSPITAL_COMMUNITY): Payer: Medicare HMO

## 2020-11-07 ENCOUNTER — Encounter: Payer: Self-pay | Admitting: Podiatry

## 2020-11-24 ENCOUNTER — Ambulatory Visit: Payer: Medicare HMO | Admitting: Pharmacist

## 2020-11-24 ENCOUNTER — Other Ambulatory Visit: Payer: Self-pay

## 2020-11-24 DIAGNOSIS — Z9861 Coronary angioplasty status: Secondary | ICD-10-CM

## 2020-11-24 DIAGNOSIS — E119 Type 2 diabetes mellitus without complications: Secondary | ICD-10-CM

## 2020-11-24 DIAGNOSIS — I2510 Atherosclerotic heart disease of native coronary artery without angina pectoris: Secondary | ICD-10-CM

## 2020-11-24 DIAGNOSIS — I10 Essential (primary) hypertension: Secondary | ICD-10-CM

## 2020-11-24 DIAGNOSIS — I5042 Chronic combined systolic (congestive) and diastolic (congestive) heart failure: Secondary | ICD-10-CM

## 2020-11-24 DIAGNOSIS — Z8673 Personal history of transient ischemic attack (TIA), and cerebral infarction without residual deficits: Secondary | ICD-10-CM

## 2020-11-24 NOTE — Chronic Care Management (AMB) (Signed)
Chronic Care Management Pharmacy  Name: [name]  MRN: [id number] DOB: 02/20/1951  Chief Complaint/ HPI  [name],  70 y.o. , male presents for their Follow-Up CCM visit with the clinical pharmacist via telephone due to COVID-19 Pandemic.  PCP : Binnie Rail, MD  Patient Care Team: Binnie Rail, MD as PCP - General (Internal Medicine) Skeet Latch, MD as PCP - Cardiology (Cardiology) Nahser, Wonda Cheng, MD (Cardiology) Renato Shin, MD (Endocrinology) Izora Gala, MD (Otolaryngology) Garvin Fila, MD (Neurology) Almedia Balls, MD (Orthopedic Surgery) Sable Feil, MD (Gastroenterology) Wynona Canes, MD as Consulting Physician (Neurology) Charlton Haws, [facility] as Pharmacist (Pharmacist)  Their chronic conditions include: Hypertension, Hyperlipidemia, Diabetes, Heart Failure, Coronary Artery Disease, GERD, Chronic Kidney Disease, Osteoarthritis and Neuropathy  Patient reports he works out 3 days per week with friend who is a Insurance underwriter - Weight training, treadmill. Goes to a restaurant few days a week - [place], [place]. Lives with daughter for a year now.   11/24/2020: Patient reports he saw kidney doctor in December and was told kidney function has improved, he should take furosemide only as needed for swelling and continue chlorthalidone daily.  Office Visits: 10/07/20 Dr Quay Burow OV: chronic f/u, no med changes 05/16/20 NP Marvis Repress VV: chronic cough, recent Xray negative; possible GERD component, trial Protonix 40 mg BID and referred to pulmonology.   04/15/20 Dr Quay Burow OV: c/o cough x months. Ordered CXR. Kidney functioning worsening - emphasize hydration and DM/BP control. Gave doxycycline course.  Consult Visit: 10/07/20 Dr Loanne Drilling (endocrine): no changes. 09/09/20 Dr Oval Linsey (cardiology): GFR worse, advised to f/u with nephrologist regarding stopping chlorthalidone.  08/13/20 [facility] - initial visit per  chart.  08/08/20 Dr Loanne Drilling (endocrine): advised to increase Trulicity and decrease insulin but pt declined.  08/08/20 Dr Oval Linsey (cardiology): BP more elevated, increasing SOB and edema. ECHO 07/2020 improved EF to 55 from 40-45%. Advised to resume losartan and add furosemide 40 mg.  07/16/20 Dr Oval Linsey (cardiology): continue to hold chlorthalidone and losartan. Referred to nephrology. Referred to cardiac rehab.  07/11/20 Dr Gala Murdoch (pulmonary): SOB improved w/ cath. Ordered PFT (former smoker, hx asbestos exposure).  07/10/20 cardiac cath: NSTEMI, successful PCI w/ DES x 1. Plan indefinite DAPT given multiple stents. Hold losartan and chlorthalidone until repeat BMP.  07/09/20 Dr Oval Linsey (cardiology): positive troponin in ED, ordered ischemic workup, hold losartan and chlorthalidone in preparation for cath. Increase carvedilol to 25 mg BID  07/07/20 ED visit: chest pain, back pain, diarrhea. Left prior to being seen.  06/13/20 Dr Loanne Drilling (endocrine): increase Trulicity to 2 shot once a week (1.5 mg). Reduce insulin to 20 units with breakfast. Trulicity reduced back to 0.75 mg after pt has worsening GERD.  05/12/20 ED visit: for cough, chest Xray negative for acute issue, BMP CBC WNL  04/10/20 Dr Loanne Drilling (endocrine): reduce insulin to 32 units with breakfast, start Trulicity. Pt attempting Lilly Cares PAP.   Allergies  Allergen Reactions  . Penicillins Anaphylaxis  . Pneumococcal Vaccines Anaphylaxis  . Shellfish Allergy Anaphylaxis  . Influenza Vaccines   . Lisinopril Cough  . Topiramate Other (See Comments)    Chest spasms and numbness  . Amlodipine Other (See Comments)    LE edema   Medications: Outpatient Encounter Medications as of 11/24/2020  Medication Sig  . aspirin EC 81 MG tablet Take 1 tablet (81 mg total) by mouth daily. Swallow whole.  Marland Kitchen atorvastatin (LIPITOR) 40 MG tablet Take 1 tablet (40 mg total) by  mouth daily.  . Blood Glucose Monitoring Suppl (ONETOUCH VERIO)  w/Device KIT USE METER TO CHECK GLUCOSE ONCE DAILY AS DIRECTED  . calcium carbonate (TUMS - DOSED IN MG ELEMENTAL CALCIUM) 500 MG chewable tablet Chew 1 tablet by mouth daily as needed for indigestion or heartburn.  . carvedilol (COREG) 12.5 MG tablet Taking 25 mg in morning and 12.5 mg in evening  . chlorthalidone (HYGROTON) 25 MG tablet Take 25 mg by mouth daily.  . clopidogrel (PLAVIX) 75 MG tablet Take 1 tablet (75 mg total) by mouth daily. Please schedule annual appt in April for refills. Thank you  . Dulaglutide (TRULICITY) 0.75 MG/0.5ML SOPN Inject 0.5 mLs (0.75 mg total) into the skin once a week.  . furosemide (LASIX) 40 MG tablet Take 1 tablet (40 mg total) by mouth daily.  Marland Kitchen glucose blood (ONETOUCH VERIO) test strip 1 each by Other route 2 (two) times daily. And lancets 2/day.  . Insulin Lispro Prot & Lispro (HUMALOG MIX 50/50 KWIKPEN) (50-50) 100 UNIT/ML Kwikpen Inject 32 Units into the skin daily with breakfast.  . losartan (COZAAR) 100 MG tablet Take 100 mg by mouth daily.  . nitroGLYCERIN (NITROSTAT) 0.4 MG SL tablet Place 1 tablet (0.4 mg total) under the tongue every 5 (five) minutes as needed for chest pain (up to 3 doses).  Letta Pate Delica Lancets 30G MISC 1 each by Does not apply route 2 (two) times daily. Use to monitor glucose levels BID; E11.9  . RELION PEN NEEDLES 31G X 6 MM MISC USE 1  ONCE DAILY  . senna-docusate (SENOKOT-S) 8.6-50 MG per tablet Take 2 tablets by mouth 2 (two) times daily. For constipation.  . simethicone (MYLICON) 125 MG chewable tablet Chew 125 mg by mouth every 6 (six) hours as needed for flatulence.   No facility-administered encounter medications on file as of 11/24/2020.   Wt Readings from Last 3 Encounters:  10/07/20 213 lb (96.6 kg)  10/07/20 212 lb (96.2 kg)  09/09/20 220 lb (99.8 kg)   Lab Results  Component Value Date   CREATININE 2.29 (H) 09/10/2020   BUN 41 (H) 09/10/2020   GFR 41.31 (L) 04/15/2020   GFRNONAA 28 (L) 09/10/2020    GFRAA 32 (L) 09/10/2020   NA 142 09/10/2020   K 3.8 09/10/2020   CALCIUM 9.6 09/10/2020   CO2 29 09/10/2020    Current Diagnosis/Assessment:   Goals Addressed   None      Heart Failure / Hypertension   HF Type: Combined Systolic and Diastolic Last ejection fraction:55% (07/29/2020, improved)  BP goal is:  <140/90  Recent Office BP readings: BP Readings from Last 3 Encounters:  10/07/20 132/78  10/07/20 116/78  09/09/20 115/75   Patient checks BP at home: infrequently - meter Patient home BP readings are ranging: not checking  Patient has failed these meds in past: n/a Patient is currently controlled on the following medications:   Carvedilol 25 mg AM and 12.5 mg PM  Losartan 100 mg daily  Furosemide 40 mg daily PRN   Chlorthalidone 25 mg daily  Amlodipine 5 mg daily    We discussed: pt reports BP at home is higher than in office and believes his cuff is malfunctioning; in office BP has been controlled; advised he callibrate his cuff next time he has an office visit  Plan  Continue current medications and control with diet and exercise   Hyperlipidemia / CAD   CAD - NSTEMI 06/2020 w/ DES; BMS 2001, DES 2005; stroke 2013  LDL goal < 70  Last lipids Lab Results  Component Value Date   CHOL 112 04/15/2020   HDL 44.60 04/15/2020   LDLCALC 40 04/15/2020   LDLDIRECT 63.0 01/12/2016   TRIG 136.0 04/15/2020   CHOLHDL 3 04/15/2020   Hepatic Function Latest Ref Rng & Units 07/07/2020 04/15/2020 10/17/2019  Total Protein 6.5 - 8.1 g/dL 8.2(H) 7.9 8.0  Albumin 3.5 - 5.0 g/dL 4.3 4.3 3.9  AST 15 - 41 U/L $Remo'25 19 19  'QsMft$ ALT 0 - 44 U/L $Remo'28 23 20  'kJWcP$ Alk Phosphatase 38 - 126 U/L 58 74 78  Total Bilirubin 0.3 - 1.2 mg/dL 1.0 0.9 1.0  Bilirubin, Direct 0.0 - 0.3 mg/dL - - -   The ASCVD Risk score Mikey Bussing DC Jr., et al., 2013) failed to calculate for the following reasons:   The patient has a prior MI or stroke diagnosis   Patient has failed these meds in past: n/a Patient  is currently controlled on the following medications:  . Atorvastatin 40 mg daily . Clopidogrel 75 mg daily HS . Nitroglycerin 0.4 mg SL prn  We discussed:  diet and exercise extensively; benefits of statin and clopidogrel for secondary prevention; pt endorses adherence and denies side effects   Plan  Continue current medications and control with diet and exercise  Diabetes   A1c goal < 7% without hypoglycemia  Recent Relevant Labs: Lab Results  Component Value Date/Time   HGBA1C 7.4 (A) 10/07/2020 02:51 PM   HGBA1C 7.1 (A) 08/08/2020 02:24 PM   HGBA1C 7.7 (H) 08/28/2018 10:12 AM   HGBA1C 9.3 (H) 02/24/2018 11:09 AM   GFR 41.31 (L) 04/15/2020 11:28 AM   GFR 48.79 (L) 10/17/2019 01:29 PM   MICROALBUR 7.5 (H) 08/17/2016 10:30 AM   MICROALBUR 9.6 (H) 08/07/2015 11:17 AM    Last diabetic Eye exam:  Lab Results  Component Value Date/Time   HMDIABEYEEXA Retinopathy (A) 09/23/2017 12:00 AM    Last diabetic Foot exam:  Lab Results  Component Value Date/Time   HMDIABFOOTEX done 11/30/2019 12:00 AM    Checking BG: 2x per Day  Recent FBG Readings: 120-140s  Patient has failed these meds in past: n/a Patient is currently uncontrolled on the following medications: . Trulicity 2.50 mg once weekly  . Humalog mix 50/50 32 units with breakfast  We discussed: Pt reports Lilly Cares PAP was approved for 2022; he endorses compliance with medications as prescribed; discussed importance of diet/exercise to further improve A1c  Plan  Continue current medications and control with diet and exercise  Chronic cough   Spirometry 10/14/2020: FEV1 71% predicted FEV1/FVC 0.85 +3% change in FEV1 post-bronchodilator  Patient has failed these meds in past: n/a Patient is currently controlled on the following medications:  . Pantoprazole 40 mg BID  We discussed:  Pt reports cough has improved since starting PPI. PFTs in November suggested moderate interstitial restriction (fibrosis or  inflammation).   Plan  Continue current medications  Medication Management   Pt uses [place] pharmacy for all medications Uses pill box? No - keeps bottles in container separated by day night Pt endorses 99% compliance  We discussed: [facility] is the preferred [facility] by [facility]. He would like to switch to [facility] but it is not a preferred pharmacy with current plan so he will stay with [place].   Plan  Continue current medication management strategy    Follow up: 6 month phone visit  Charlene Brooke, PharmD, BCACP Clinical Pharmacist [facility] at [facility]  [phone]

## 2020-11-24 NOTE — Patient Instructions (Signed)
Visit Information  Phone number for Pharmacist: [phone]  Goals Addressed            This Visit's Progress   . Pharmacy Care Plan       CARE PLAN ENTRY  Current Barriers:  . Chronic Disease Management support, education, and care coordination needs related to Hypertension, Hyperlipidemia, Diabetes, Heart Failure, and Coronary Artery Disease   Hypertension / Heart Failure BP Readings from Last 3 Encounters:  10/07/20 132/78  10/07/20 116/78  09/09/20 115/75 .  Pharmacist Clinical Goal(s): o Over the next 90 days, patient will work with PharmD and providers to achieve BP goal <140/90 . Current regimen:  o Carvedilol 25 mg twice daily o Chlorthalidone 25 mg AM and 12.5 mg PM o Losartan 100 mg daily o Amlodipine 5 mg daily o Furosemide 40 mg daily as needed . Interventions: o Discussed benefits of medications for controlling BP and heart failure, and prevention of heart attack and stroke . Patient self care activities - Over the next 90 days, patient will: o Check BP daily, document, and provide at future appointments o Ensure daily salt intake < 2300 mg/day  Hyperlipidemia / Coronary artery disease Lab Results  Component Value Date/Time   LDLCALC 40 04/15/2020 11:28 AM   LDLCALC 82 07/20/2017 12:02 PM   LDLDIRECT 63.0 01/12/2016 09:31 AM   Pharmacist Clinical Goal(s): o Over the next 90 days, patient will work with PharmD and providers to maintain LDL goal < 70 . Current regimen:  o Atorvastatin 40 mg daily o Clopidogrel 75 mg daily o Nitroglycerin 0.4 mg as needed . Interventions: o Discussed benefits of medications for prevention of heart attack and stroke o Discussed when to use nitroglycerin . Patient self care activities - Over the next 90 days, patient will: o Continue medication as prescribed o Continue low cholesterol diet and exercise routine  Diabetes Lab Results  Component Value Date/Time   HGBA1C 7.4 (A) 10/07/2020 02:51 PM   HGBA1C 7.1 (A)  08/08/2020 02:24 PM   HGBA1C 7.7 (H) 08/28/2018 10:12 AM   HGBA1C 9.3 (H) 02/24/2018 11:09 AM .  Pharmacist Clinical Goal(s): o Over the next 90 days, patient will work with PharmD and providers to achieve A1c goal <7% without hypoglycemia . Current regimen:  o Trulicity 8.41 mg once weekly o Humalog mix 50/50 32 units with breakfast . Interventions: o Discussed BG goals and benefits of medications for prevention of diabetic complications . Patient self care activities - Over the next 90 days, patient will: o Check blood sugar twice daily, document, and provide at future appointments o Contact provider with any episodes of hypoglycemia  Medication management . Pharmacist Clinical Goal(s): o Over the next 90 days, patient will work with PharmD and providers to achieve optimal medication adherence . Current pharmacy: [facility] . Interventions o Comprehensive medication review performed. o Continue current medication management strategy . Patient self care activities - Over the next 90 days, patient will: o Focus on medication adherence by fill date o Take medications as prescribed o Report any questions or concerns to PharmD and/or provider(s)  Please see past updates related to this goal by clicking on the "Past Updates" button in the selected goal       The patient verbalized understanding of instructions, educational materials, and care plan provided today and declined offer to receive copy of patient instructions, educational materials, and care plan.  Telephone follow up appointment with pharmacy team member scheduled for: 3 months  Charlene Brooke, PharmD, [facility]  Clinical Pharmacist [facility] at [facility] [phone]

## 2020-11-25 IMAGING — CR DG CHEST 2V
2 series · 2 of 2 positions shown · non-contrast
Comparison: April 15, 2020

CLINICAL DATA: Cough and lower back pain x1 day.

EXAM:
CHEST - 2 VIEW

[chest pa]
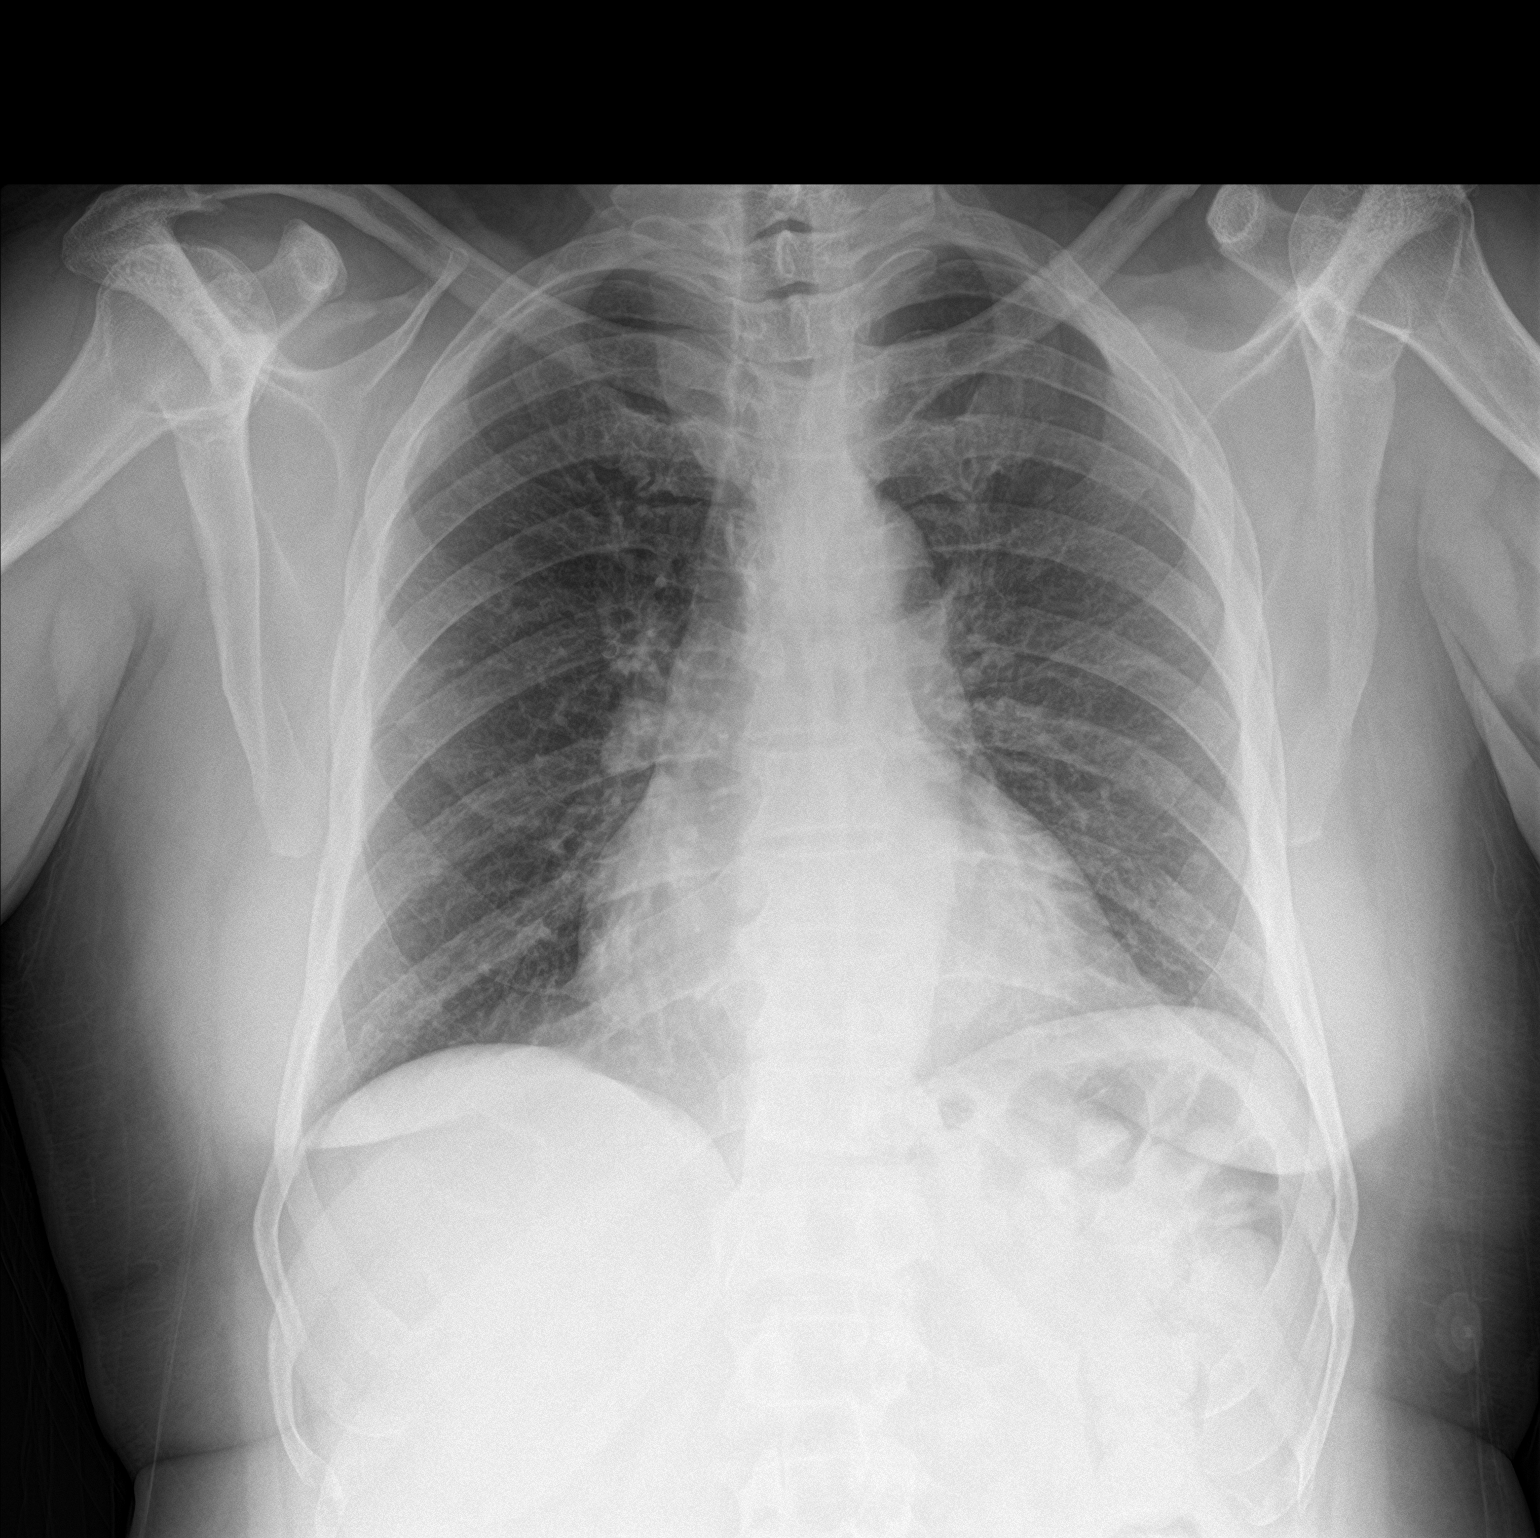

[chest lat]
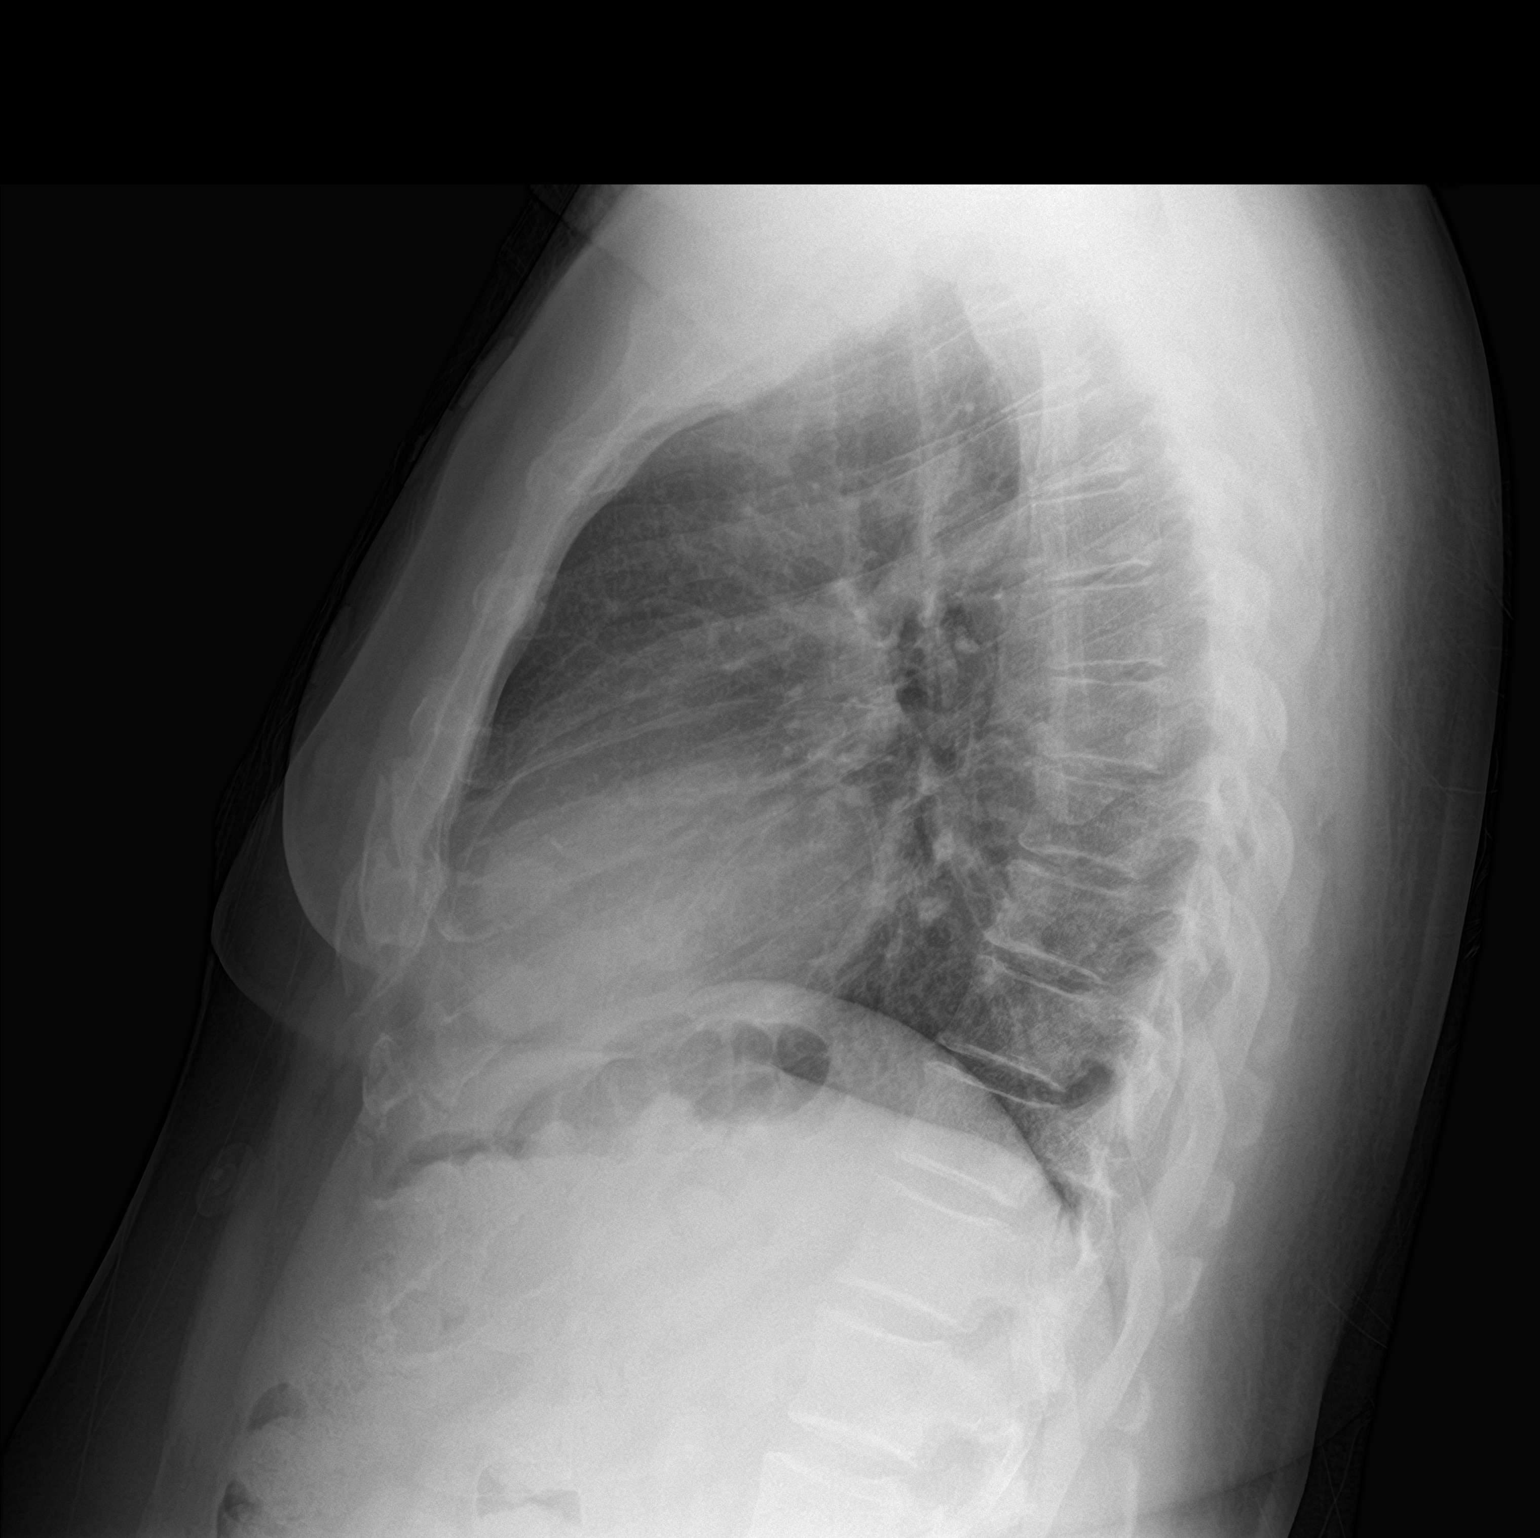

[2 of 2 positions shown; findings below may reference images not displayed]

FINDINGS: The lungs are hyperinflated. Mild, diffuse chronic appearing
increased lung markings are seen. There is no evidence of acute
infiltrate, pleural effusion or pneumothorax. The heart size and
mediastinal contours are within normal limits. The visualized
skeletal structures are unremarkable.
IMPRESSION: No active cardiopulmonary disease.

## 2020-12-03 ENCOUNTER — Other Ambulatory Visit: Payer: Self-pay

## 2020-12-03 ENCOUNTER — Encounter (INDEPENDENT_AMBULATORY_CARE_PROVIDER_SITE_OTHER): Payer: Medicare HMO | Admitting: Ophthalmology

## 2020-12-03 DIAGNOSIS — E113293 Type 2 diabetes mellitus with mild nonproliferative diabetic retinopathy without macular edema, bilateral: Secondary | ICD-10-CM

## 2020-12-03 DIAGNOSIS — H353122 Nonexudative age-related macular degeneration, left eye, intermediate dry stage: Secondary | ICD-10-CM | POA: Diagnosis not present

## 2020-12-03 DIAGNOSIS — H43813 Vitreous degeneration, bilateral: Secondary | ICD-10-CM

## 2020-12-03 DIAGNOSIS — H35033 Hypertensive retinopathy, bilateral: Secondary | ICD-10-CM

## 2020-12-03 DIAGNOSIS — I10 Essential (primary) hypertension: Secondary | ICD-10-CM | POA: Diagnosis not present

## 2020-12-03 DIAGNOSIS — H35373 Puckering of macula, bilateral: Secondary | ICD-10-CM

## 2020-12-07 ENCOUNTER — Other Ambulatory Visit: Payer: Self-pay | Admitting: Cardiovascular Disease

## 2020-12-11 LAB — COLOGUARD

## 2020-12-16 ENCOUNTER — Ambulatory Visit (HOSPITAL_BASED_OUTPATIENT_CLINIC_OR_DEPARTMENT_OTHER): Payer: Medicare HMO | Attending: Cardiovascular Disease | Admitting: Cardiovascular Disease

## 2020-12-16 ENCOUNTER — Other Ambulatory Visit: Payer: Self-pay

## 2020-12-16 DIAGNOSIS — G4733 Obstructive sleep apnea (adult) (pediatric): Secondary | ICD-10-CM | POA: Insufficient documentation

## 2020-12-16 DIAGNOSIS — R0601 Orthopnea: Secondary | ICD-10-CM | POA: Diagnosis not present

## 2020-12-16 DIAGNOSIS — R0683 Snoring: Secondary | ICD-10-CM | POA: Diagnosis present

## 2020-12-17 ENCOUNTER — Other Ambulatory Visit (HOSPITAL_BASED_OUTPATIENT_CLINIC_OR_DEPARTMENT_OTHER): Payer: Self-pay

## 2020-12-17 DIAGNOSIS — R0683 Snoring: Secondary | ICD-10-CM

## 2020-12-17 DIAGNOSIS — R400 Somnolence: Secondary | ICD-10-CM

## 2020-12-17 DIAGNOSIS — R0601 Orthopnea: Secondary | ICD-10-CM

## 2020-12-19 ENCOUNTER — Ambulatory Visit: Payer: Medicare HMO | Admitting: Podiatry

## 2020-12-20 ENCOUNTER — Other Ambulatory Visit: Payer: Self-pay | Admitting: Cardiovascular Disease

## 2020-12-22 ENCOUNTER — Encounter (HOSPITAL_BASED_OUTPATIENT_CLINIC_OR_DEPARTMENT_OTHER): Payer: Self-pay | Admitting: Cardiovascular Disease

## 2020-12-22 NOTE — Procedures (Signed)
Patient Name: [name], [name] Date: 12/16/2020 Gender: Male D.O.B: 04-Jul-1951 Age (years): 55 Referring Provider: Skeet Latch Height (inches): 71 Interpreting Physician: Shelva Majestic MD, ABSM Weight (lbs): 220 RPSGT: Laren Everts BMI: 31 MRN: [id number] Neck Size: 16.50  CLINICAL INFORMATION Sleep Study Type: Split Night CPAP  Indication for sleep study: Diabetes, Excessive Daytime Sleepiness, Fatigue, Hypertension, Obesity, Sleep walking/talking/parasomnias, Snoring  Epworth Sleepiness Score: 9  SLEEP STUDY TECHNIQUE As per the AASM Manual for the Scoring of Sleep and Associated Events v2.3 (April 2016) with a hypopnea requiring 4% desaturations.  The channels recorded and monitored were frontal, central and occipital EEG, electrooculogram (EOG), submentalis EMG (chin), nasal and oral airflow, thoracic and abdominal wall motion, anterior tibialis EMG, snore microphone, electrocardiogram, and pulse oximetry. Continuous positive airway pressure (CPAP) was initiated when the patient met split night criteria and was titrated according to treat sleep-disordered breathing.  MEDICATIONS amLODipine (NORVASC) 5 MG tablet aspirin EC 81 MG tablet atorvastatin (LIPITOR) 40 MG tablet Blood Glucose Monitoring Suppl (ONETOUCH VERIO) w/Device KIT calcium carbonate (TUMS - DOSED IN MG ELEMENTAL CALCIUM) 500 MG chewable tablet carvedilol (COREG) 12.5 MG tablet chlorthalidone (HYGROTON) 25 MG tablet clopidogrel (PLAVIX) 75 MG tablet Dulaglutide (TRULICITY) 1.84 CR/7.5OH SOPN furosemide (LASIX) 40 MG tablet(Expired) glucose blood (ONETOUCH VERIO) test strip Insulin Lispro Prot & Lispro (HUMALOG MIX 50/50 KWIKPEN) (50-50) 100 UNIT/ML Kwikpen losartan (COZAAR) 100 MG tablet nitroGLYCERIN (NITROSTAT) 0.4 MG SL tablet OneTouch Delica Lancets 60O MISC RELION PEN NEEDLES 31G X 6 MM MISC senna-docusate (SENOKOT-S) 8.6-50 MG per tablet simethicone (MYLICON) 770 MG  chewable tablet Medications self-administered by patient taken the night of the study : CARVEDILOL  RESPIRATORY PARAMETERS Diagnostic Total AHI (/hr): 18.9 RDI (/hr): 25.5 OA Index (/hr): 0.9 CA Index (/hr): 0.0 REM AHI (/hr): 55.4 NREM AHI (/hr): 12.3 Supine AHI (/hr): 21.0 Non-supine AHI (/hr): 18.2 Min O2 Sat (%): 86.0 Mean O2 (%): 95.6 Time below 88% (min): 0.7   Titration Optimal Pressure (cm): 14 AHI at Optimal Pressure (/hr): 0.0 Min O2 at Optimal Pressure (%): 94.0 Supine % at Optimal (%): 100 Sleep % at Optimal (%): 59   SLEEP ARCHITECTURE The recording time for the entire night was 430 minutes.  During a baseline period of 186.4 minutes, the patient slept for 127.0 minutes in REM and nonREM, yielding a sleep efficiency of 68.1%%. Sleep onset after lights out was 31.7 minutes with a REM latency of 107.0 minutes. The patient spent 8.7%% of the night in stage N1 sleep, 76.0%% in stage N2 sleep, 0.0%% in stage N3 and 15.4% in REM.   During the titration period of 237.2 minutes, the patient slept for 219.0 minutes in REM and nonREM, yielding a sleep efficiency of 92.3%%. Sleep onset after CPAP initiation was 5.4 minutes with a REM latency of 41.0 minutes. The patient spent 3.7%% of the night in stage N1 sleep, 68.9%% in stage N2 sleep, 0.0%% in stage N3 and 27.4% in REM.  CARDIAC DATA The 2 lead EKG demonstrated sinus rhythm. The mean heart rate was 100.0 beats per minute. Other EKG findings include: None.  LEG MOVEMENT DATA The total Periodic Limb Movements of Sleep (PLMS) were 0. The PLMS index was 0.0 .  IMPRESSIONS - Moderate obstructive sleep apnea occurred during the diagnostic portion of the study (AHI 18.9/h; RDI 25.5/h); however, events were severe during REM sleep (AHI 55.4/h).  CPAP was initiated at 5 cm and was titrated to optimal PAP pressure at 14  cm of water. (  AHI 0; O2 nadir 97.4%). - No significant central sleep apnea occurred during the diagnostic portion of the  study (CAI 0.0/hour). - The patient had mild oxygen desaturation during the diagnostic portion of the study to a nadir of 86.0%. - The patient snored with moderate snoring volume during the diagnostic portion of the study. - No cardiac abnormalities were noted during this study. - Clinically significant periodic limb movements did not occur during sleep.  DIAGNOSIS - Obstructive Sleep Apnea (G47.33)  RECOMMENDATIONS - Recommend an initial trial of CPAP therapy with EPR at 14 cm H2O with heated humidification. A Large size Resmed Full Face Mask AirFit F20 mask was used for the titration. - Avoid alcohol, sedatives and other CNS depressants that may worsen sleep apnea and disrupt normal sleep architecture. - Sleep hygiene should be reviewed to assess factors that may improve sleep quality. - Weight management (BMI 31) and regular exercise should be initiated or continued. - Recommend a download in 30 days and sleep clinic evaluation after 4 weeks of therapy.  [Electronically signed] 12/22/2020 12:04 PM  Shelva Majestic MD, [facility], [facility], American Board of Sleep Medicine   NPI: [id number] [place] PH: [phone]   FX: [phone] [place]

## 2020-12-24 ENCOUNTER — Ambulatory Visit (INDEPENDENT_AMBULATORY_CARE_PROVIDER_SITE_OTHER): Payer: Medicare HMO | Admitting: Podiatry

## 2020-12-24 ENCOUNTER — Telehealth: Payer: Self-pay | Admitting: *Deleted

## 2020-12-24 ENCOUNTER — Telehealth: Payer: Self-pay | Admitting: Cardiovascular Disease

## 2020-12-24 ENCOUNTER — Other Ambulatory Visit: Payer: Self-pay

## 2020-12-24 DIAGNOSIS — M79674 Pain in right toe(s): Secondary | ICD-10-CM

## 2020-12-24 DIAGNOSIS — E1142 Type 2 diabetes mellitus with diabetic polyneuropathy: Secondary | ICD-10-CM

## 2020-12-24 DIAGNOSIS — M79675 Pain in left toe(s): Secondary | ICD-10-CM

## 2020-12-24 DIAGNOSIS — B351 Tinea unguium: Secondary | ICD-10-CM

## 2020-12-24 NOTE — Telephone Encounter (Signed)
Called to discuss sleep study results and recommendations. Could not leave message. Mailbox full. Will try again later.

## 2020-12-24 NOTE — Telephone Encounter (Signed)
° ° ° °*  STAT* If patient is at the pharmacy, call can be transferred to refill team.   1. Which medications need to be refilled? (please list name of each medication and dose if known)   losartan (COZAAR) 100 MG tablet    2. Which pharmacy/location (including street and city if local pharmacy) is medication to be sent to? [place], [place]  3. Do they need a 30 day or 90 day supply? 90 days  Pt been out of meds for 3 days, needs refill today

## 2020-12-24 NOTE — Progress Notes (Signed)
This patient returns to my office for at risk foot care.  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  Patient is taking plavix.  Patient has history of CVA.  This patient is unable to cut nails himself since the patient cannot reach his nails.These nails are painful walking and wearing shoes.  This patient presents for at risk foot care today.  General Appearance  Alert, conversant and in no acute stress.  Vascular  Dorsalis pedis and posterior tibial  pulses are weakly  palpable  bilaterally.  Capillary return is within normal limits  Bilaterally.Cold feet  Bilaterally.  Absent digital hair  B/L.  Neurologic  Senn-Weinstein monofilament wire test diminished  bilaterally. Muscle power within normal limits bilaterally.  Nails Thick disfigured discolored nails with subungual debris  from hallux to fifth toes bilaterally. No evidence of bacterial infection or drainage bilaterally.  Orthopedic  No limitations of motion  feet .  No crepitus or effusions noted.  No bony pathology or digital deformities noted.  Skin  normotropic skin with no porokeratosis noted bilaterally.  No signs of infections or ulcers noted.  Midfoot DJD right.  Hammer toes  B/L.   Onychomycosis  Pain in right toes  Pain in left toes  Consent was obtained for treatment procedures.   Mechanical debridement of nails 1-5  bilaterally performed with a nail nipper.  Filed with dremel without incident.    Return office visit   3 months                   Told patient to return for periodic foot care and evaluation due to potential at risk complications.   Gardiner Barefoot DPM

## 2020-12-25 ENCOUNTER — Encounter: Payer: Self-pay | Admitting: Internal Medicine

## 2020-12-25 DIAGNOSIS — E1142 Type 2 diabetes mellitus with diabetic polyneuropathy: Secondary | ICD-10-CM | POA: Insufficient documentation

## 2020-12-29 MED ORDER — LOSARTAN POTASSIUM 100 MG PO TABS
100.0000 mg | ORAL_TABLET | Freq: Every day | ORAL | 3 refills | Status: DC
Start: 2020-12-29 — End: 2021-10-07

## 2020-12-29 NOTE — Telephone Encounter (Signed)
Confirmed patient taking and Nephrologist has not discontinued  Refilled as requested

## 2020-12-29 NOTE — Telephone Encounter (Signed)
Patient notified of sleep study results and recommendations. He agrees to proceed with CPAP treatment. CPAP orders faxed to [facility]. Patient informed of Machine back order status.

## 2021-01-02 LAB — COLOGUARD: Cologuard: NEGATIVE

## 2021-01-07 ENCOUNTER — Ambulatory Visit: Payer: Medicare HMO | Admitting: Endocrinology

## 2021-01-15 ENCOUNTER — Encounter: Payer: Self-pay | Admitting: Internal Medicine

## 2021-01-16 ENCOUNTER — Encounter: Payer: Self-pay | Admitting: Internal Medicine

## 2021-01-19 ENCOUNTER — Telehealth: Payer: Self-pay | Admitting: Internal Medicine

## 2021-01-19 NOTE — Telephone Encounter (Signed)
LVM for pt to rtn my call to schedule awv with nha. Please schedule appt if pt calls the office.  

## 2021-01-20 ENCOUNTER — Telehealth: Payer: Self-pay | Admitting: Pharmacist

## 2021-01-20 ENCOUNTER — Ambulatory Visit: Payer: Medicare HMO | Admitting: Cardiovascular Disease

## 2021-01-20 NOTE — Progress Notes (Signed)
Chronic Care Management Pharmacy Assistant   Name: [name]  MRN: [id number] DOB: 11-03-1951  Reason for Encounter: Diabetic Adherence Call   PCP : Binnie Rail, MD  Allergies:   Allergies  Allergen Reactions  . Penicillins Anaphylaxis  . Pneumococcal Vaccines Anaphylaxis  . Shellfish Allergy Anaphylaxis  . Influenza Vaccines   . Lisinopril Cough  . Topiramate Other (See Comments)    Chest spasms and numbness  . Amlodipine Other (See Comments)    LE edema  . Levitra [Vardenafil] Other (See Comments)    headaches  . Metformin Nausea And Vomiting  . Tadalafil Other (See Comments)    dizziness    Medications: Outpatient Encounter Medications as of 01/20/2021  Medication Sig  . amLODipine (NORVASC) 5 MG tablet Take 5 mg by mouth daily.  Marland Kitchen aspirin EC 81 MG tablet Take 1 tablet (81 mg total) by mouth daily. Swallow whole.  Marland Kitchen atorvastatin (LIPITOR) 40 MG tablet Take 1 tablet (40 mg total) by mouth daily.  . Blood Glucose Monitoring Suppl (ONETOUCH VERIO) w/Device KIT USE METER TO CHECK GLUCOSE ONCE DAILY AS DIRECTED  . calcium carbonate (TUMS - DOSED IN MG ELEMENTAL CALCIUM) 500 MG chewable tablet Chew 1 tablet by mouth daily as needed for indigestion or heartburn.  . carvedilol (COREG) 12.5 MG tablet TAKE 1 & 1/2 (ONE & ONE-HALF) TABLETS BY MOUTH TWICE DAILY  . chlorthalidone (HYGROTON) 25 MG tablet Take 25 mg by mouth daily.  . clopidogrel (PLAVIX) 75 MG tablet Take 1 tablet (75 mg total) by mouth daily. Please schedule annual appt in April for refills. Thank you  . Dulaglutide (TRULICITY) 2.77 AJ/2.8NO SOPN Inject 0.5 mLs (0.75 mg total) into the skin once a week.  . furosemide (LASIX) 40 MG tablet Take 1 tablet (40 mg total) by mouth daily.  Marland Kitchen glucose blood (ONETOUCH VERIO) test strip 1 each by Other route 2 (two) times daily. And lancets 2/day.  . Insulin Lispro Prot & Lispro (HUMALOG MIX 50/50 KWIKPEN) (50-50) 100 UNIT/ML Kwikpen Inject 32 Units into the skin  daily with breakfast.  . losartan (COZAAR) 100 MG tablet Take 1 tablet (100 mg total) by mouth daily.  . nitroGLYCERIN (NITROSTAT) 0.4 MG SL tablet Place 1 tablet (0.4 mg total) under the tongue every 5 (five) minutes as needed for chest pain (up to 3 doses).  [name] Delica Lancets 67E MISC 1 each by Does not apply route 2 (two) times daily. Use to monitor glucose levels BID; E11.9  . RELION PEN NEEDLES 31G X 6 MM MISC USE 1  ONCE DAILY  . senna-docusate (SENOKOT-S) 8.6-50 MG per tablet Take 2 tablets by mouth 2 (two) times daily. For constipation.  . simethicone (MYLICON) 720 MG chewable tablet Chew 125 mg by mouth every 6 (six) hours as needed for flatulence.   No facility-administered encounter medications on file as of 01/20/2021.    Current Diagnosis: Patient Active Problem List   Diagnosis Date Noted  . Diabetic peripheral neuropathy ([place]) 12/25/2020  . Orthopnea 12/16/2020  . Snoring 09/09/2020  . Unstable angina ([place]) 07/09/2020  . Sciatica of left side 07/11/2019  . Cough 07/11/2019  . Edema leg 10/26/2018  . History of stroke 10/26/2018  . DOE (dyspnea on exertion) 10/26/2018  . Left hip pain 07/14/2018  . Acute left ankle pain 07/14/2018  . Chest pain 07/14/2018  . Nonintractable headache 02/24/2018  . CRI (chronic renal insufficiency), stage 3 (moderate) 02/22/2017  . Anemia 02/16/2017  . Chronic combined  systolic and diastolic heart failure ([place]) 06/29/2016  . LBBB (left bundle branch block) 02/02/2016  . Nonallopathic lesion of lumbosacral region 12/11/2014  . Nonallopathic lesion of sacral region 12/11/2014  . Nonallopathic lesion of thoracic region 12/11/2014  . Arthritis of left hip 11/20/2014  . Ischial bursitis of left side 10/28/2014  . Hamstring tightness of left lower extremity 09/16/2014  . Piriformis syndrome of left side 09/16/2014  . Dysphonia 06/22/2013  . Hemiparesis affecting left side as late effect of stroke ([place]) 10/10/2012  . Dysphagia  following cerebrovascular accident 08/14/2012  . Chronic back pain   . Peripheral neuropathy ([place])   . TIA (transient ischemic attack) 04/08/2012  . Stroke ([place]) 02/21/2012  . [place], RIGHT 05/29/2010  . Diabetes ([place]) 04/13/2010  . ALLERGIC RHINITIS 04/13/2010  . GERD 04/13/2010  . ERECTILE DYSFUNCTION 03/12/2009  . Essential hypertension 03/12/2009  . CAD S/P percutaneous coronary angioplasty 03/12/2009  . Hyperlipidemia 03/07/2009    Goals Addressed   None     Follow-Up:  Pharmacist Review  Recent Relevant Labs: Lab Results  Component Value Date/Time   HGBA1C 7.4 (A) 10/07/2020 02:51 PM   HGBA1C 7.1 (A) 08/08/2020 02:24 PM   HGBA1C 7.7 (H) 08/28/2018 10:12 AM   HGBA1C 9.3 (H) 02/24/2018 11:09 AM   MICROALBUR 7.5 (H) 08/17/2016 10:30 AM   MICROALBUR 9.6 (H) 08/07/2015 11:17 AM    Kidney Function Lab Results  Component Value Date/Time   CREATININE 2.29 (H) 09/10/2020 02:37 PM   CREATININE 1.88 (H) 08/15/2020 01:12 PM   GFR 41.31 (L) 04/15/2020 11:28 AM   GFRNONAA 28 (L) 09/10/2020 02:37 PM   GFRAA 32 (L) 09/10/2020 02:37 PM    . Current antihyperglycemic regimen: The patient is taking Trulicity and Humalog  . What recent interventions/DTPs have been made to improve glycemic control: None  . Have there been any recent hospitalizations or ED visits since last visit with CPP? The patient states he has not been to the Hospital or ED  . Patient denies hypoglycemic symptoms . Patient denies hyperglycemic symptoms  . How often are you checking your blood sugar? The patient states that he has not checked his blood sugar since the end of January  . What are your blood sugars ranging? The patient does not have any home blood sugar reading o Fasting: NA o Before meals: NA o After meals: NA o Bedtime: NA . During the week, how often does your blood glucose drop below 70? The patient believes that he has not had any readings below 70  . Are you checking your  feet daily/regularly? The patient states that he does have some sores on both legs that are raised, seemed like it is not healing  Adherence Review: Is the patient currently on a STATIN medication? Yes, Atorvastatin Is the patient currently on ACE/ARB medication? Yes, Losartan Does the patient have >5 day gap between last estimated fill dates? No   Wendy Poet, [place]   Time spent:20

## 2021-01-21 ENCOUNTER — Other Ambulatory Visit: Payer: Self-pay

## 2021-01-21 MED ORDER — CHLORTHALIDONE 25 MG PO TABS
25.0000 mg | ORAL_TABLET | Freq: Every day | ORAL | 3 refills | Status: DC
Start: 2021-01-21 — End: 2022-02-03

## 2021-01-21 MED ORDER — FUROSEMIDE 40 MG PO TABS: 40.0000 mg | ORAL_TABLET | Freq: Every day | ORAL | 2 refills | Status: DC

## 2021-01-21 MED ORDER — ONETOUCH VERIO W/DEVICE KIT: PACK | 0 refills | Status: DC

## 2021-01-21 MED ORDER — CARVEDILOL 12.5 MG PO TABS: ORAL_TABLET | ORAL | 2 refills | Status: DC

## 2021-01-21 MED ORDER — CLOPIDOGREL BISULFATE 75 MG PO TABS: 75.0000 mg | ORAL_TABLET | Freq: Every day | ORAL | 3 refills | Status: DC

## 2021-01-21 MED ORDER — ATORVASTATIN CALCIUM 40 MG PO TABS
40.0000 mg | ORAL_TABLET | Freq: Every day | ORAL | 3 refills | Status: DC
Start: 2021-01-21 — End: 2021-11-11

## 2021-01-21 NOTE — Progress Notes (Signed)
Spoke with patient this morning and told him per clinical pharmacist Mendel Ryder that it is important for him to check his blood sugar at least once a day since he is on insulin. The patient stated that he does not have a meter, the one he had is broken. He stated that the meter he had was a one touch meter.  Wendy Poet, [place] [phone]

## 2021-01-21 NOTE — Telephone Encounter (Signed)
Faxed in today. 

## 2021-01-23 ENCOUNTER — Other Ambulatory Visit: Payer: Self-pay

## 2021-01-28 ENCOUNTER — Other Ambulatory Visit: Payer: Self-pay

## 2021-01-28 ENCOUNTER — Telehealth: Payer: Self-pay

## 2021-01-28 ENCOUNTER — Ambulatory Visit (INDEPENDENT_AMBULATORY_CARE_PROVIDER_SITE_OTHER): Payer: Medicare HMO

## 2021-01-28 VITALS — BP 150/80 | HR 73 | Temp 98.0°F | Ht 71.0 in | Wt 216.2 lb

## 2021-01-28 DIAGNOSIS — Z0000 Encounter for general adult medical examination without abnormal findings: Secondary | ICD-10-CM | POA: Diagnosis not present

## 2021-01-28 DIAGNOSIS — E1140 Type 2 diabetes mellitus with diabetic neuropathy, unspecified: Secondary | ICD-10-CM

## 2021-01-28 DIAGNOSIS — IMO0002 Reserved for concepts with insufficient information to code with codable children: Secondary | ICD-10-CM

## 2021-01-28 MED ORDER — ONETOUCH VERIO VI STRP: 1.0000 | ORAL_STRIP | Freq: Two times a day (BID) | 3 refills | Status: DC

## 2021-01-28 MED ORDER — ONETOUCH VERIO W/DEVICE KIT: PACK | 0 refills | Status: DC

## 2021-01-28 MED ORDER — ONETOUCH DELICA LANCETS 30G MISC: 1.0000 | Freq: Two times a day (BID) | 3 refills | Status: DC

## 2021-01-28 NOTE — Patient Instructions (Signed)
[name] , Thank you for taking time to come for your Medicare Wellness Visit. I appreciate your ongoing commitment to your health goals. Please review the following plan we discussed and let me know if I can assist you in the future.   Screening recommendations/referrals: Colonoscopy: 01/02/2021; due every 3  Years (Cologuard) Recommended yearly ophthalmology/optometry visit for glaucoma screening and checkup Recommended yearly dental visit for hygiene and checkup  Vaccinations: Influenza vaccine: declined Pneumococcal vaccine: declined Tdap vaccine: 11/30/2011; due every 10 years (2023) Shingles vaccine: never done  Covid-19: 03/01/2020, 03/26/2020, 10/14/2020  Advanced directives: Advance directive discussed with you today. Even though you declined this today please call our office should you change your mind and we can give you the proper paperwork for you to fill out.  Conditions/risks identified: Yes; Reviewed health maintenance screenings with patient today and relevant education, vaccines, and/or referrals were provided. Please continue to do your personal lifestyle choices by: daily care of teeth and gums, regular physical activity (goal should be 5 days a week for 30 minutes), eat a healthy diet, avoid tobacco and drug use, limiting any alcohol intake, taking a low-dose aspirin (if not allergic or have been advised by your provider otherwise) and taking vitamins and minerals as recommended by your provider. Continue doing brain stimulating activities (puzzles, reading, adult coloring books, staying active) to keep memory sharp. Continue to eat heart healthy diet (full of fruits, vegetables, whole grains, lean protein, water--limit salt, fat, and sugar intake) and increase physical activity as tolerated.  Next appointment: Please schedule your next Medicare Wellness Visit with your Nurse Health Advisor in 1 year by calling [phone].  Preventive Care 8 Years and Older, Male Preventive  care refers to lifestyle choices and visits with your health care provider that can promote health and wellness. What does preventive care include?  A yearly physical exam. This is also called an annual well check.  Dental exams once or twice a year.  Routine eye exams. Ask your health care provider how often you should have your eyes checked.  Personal lifestyle choices, including:  Daily care of your teeth and gums.  Regular physical activity.  Eating a healthy diet.  Avoiding tobacco and drug use.  Limiting alcohol use.  Practicing safe sex.  Taking low doses of aspirin every day.  Taking vitamin and mineral supplements as recommended by your health care provider. What happens during an annual well check? The services and screenings done by your health care provider during your annual well check will depend on your age, overall health, lifestyle risk factors, and family history of disease. Counseling  Your health care provider may ask you questions about your:  Alcohol use.  Tobacco use.  Drug use.  Emotional well-being.  Home and relationship well-being.  Sexual activity.  Eating habits.  History of falls.  Memory and ability to understand (cognition).  Work and work Statistician. Screening  You may have the following tests or measurements:  Height, weight, and BMI.  Blood pressure.  Lipid and cholesterol levels. These may be checked every 5 years, or more frequently if you are over 76 years old.  Skin check.  Lung cancer screening. You may have this screening every year starting at age 36 if you have a 30-pack-year history of smoking and currently smoke or have quit within the past 15 years.  Fecal occult blood test (FOBT) of the stool. You may have this test every year starting at age 84.  Flexible sigmoidoscopy or colonoscopy.  You may have a sigmoidoscopy every 5 years or a colonoscopy every 10 years starting at age 69.  Prostate cancer  screening. Recommendations will vary depending on your family history and other risks.  Hepatitis C blood test.  Hepatitis B blood test.  Sexually transmitted disease (STD) testing.  Diabetes screening. This is done by checking your blood sugar (glucose) after you have not eaten for a while (fasting). You may have this done every 1-3 years.  Abdominal aortic aneurysm (AAA) screening. You may need this if you are a current or former smoker.  Osteoporosis. You may be screened starting at age 14 if you are at high risk. Talk with your health care provider about your test results, treatment options, and if necessary, the need for more tests. Vaccines  Your health care provider may recommend certain vaccines, such as:  Influenza vaccine. This is recommended every year.  Tetanus, diphtheria, and acellular pertussis (Tdap, Td) vaccine. You may need a Td booster every 10 years.  Zoster vaccine. You may need this after age 28.  Pneumococcal 13-valent conjugate (PCV13) vaccine. One dose is recommended after age 70.  Pneumococcal polysaccharide (PPSV23) vaccine. One dose is recommended after age 44. Talk to your health care provider about which screenings and vaccines you need and how often you need them. This information is not intended to replace advice given to you by your health care provider. Make sure you discuss any questions you have with your health care provider. Document Released: 12/05/2015 Document Revised: 07/28/2016 Document Reviewed: 09/09/2015 Elsevier Interactive Patient Education  2017 [employer] Prevention in the Home Falls can cause injuries. They can happen to people of all ages. There are many things you can do to make your home safe and to help prevent falls. What can I do on the outside of my home?  Regularly fix the edges of walkways and driveways and fix any cracks.  Remove anything that might make you trip as you walk through a door, such as a raised  step or threshold.  Trim any bushes or trees on the path to your home.  Use bright outdoor lighting.  Clear any walking paths of anything that might make someone trip, such as rocks or tools.  Regularly check to see if handrails are loose or broken. Make sure that both sides of any steps have handrails.  Any raised decks and porches should have guardrails on the edges.  Have any leaves, snow, or ice cleared regularly.  Use sand or salt on walking paths during winter.  Clean up any spills in your garage right away. This includes oil or grease spills. What can I do in the bathroom?  Use night lights.  Install grab bars by the toilet and in the tub and shower. Do not use towel bars as grab bars.  Use non-skid mats or decals in the tub or shower.  If you need to sit down in the shower, use a plastic, non-slip stool.  Keep the floor dry. Clean up any water that spills on the floor as soon as it happens.  Remove soap buildup in the tub or shower regularly.  Attach bath mats securely with double-sided non-slip rug tape.  Do not have throw rugs and other things on the floor that can make you trip. What can I do in the bedroom?  Use night lights.  Make sure that you have a light by your bed that is easy to reach.  Do not use any sheets  or blankets that are too big for your bed. They should not hang down onto the floor.  Have a firm chair that has side arms. You can use this for support while you get dressed.  Do not have throw rugs and other things on the floor that can make you trip. What can I do in the kitchen?  Clean up any spills right away.  Avoid walking on wet floors.  Keep items that you use a lot in easy-to-reach places.  If you need to reach something above you, use a strong step stool that has a grab bar.  Keep electrical cords out of the way.  Do not use floor polish or wax that makes floors slippery. If you must use wax, use non-skid floor wax.  Do not  have throw rugs and other things on the floor that can make you trip. What can I do with my stairs?  Do not leave any items on the stairs.  Make sure that there are handrails on both sides of the stairs and use them. Fix handrails that are broken or loose. Make sure that handrails are as long as the stairways.  Check any carpeting to make sure that it is firmly attached to the stairs. Fix any carpet that is loose or worn.  Avoid having throw rugs at the top or bottom of the stairs. If you do have throw rugs, attach them to the floor with carpet tape.  Make sure that you have a light switch at the top of the stairs and the bottom of the stairs. If you do not have them, ask someone to add them for you. What else can I do to help prevent falls?  Wear shoes that:  Do not have high heels.  Have rubber bottoms.  Are comfortable and fit you well.  Are closed at the toe. Do not wear sandals.  If you use a stepladder:  Make sure that it is fully opened. Do not climb a closed stepladder.  Make sure that both sides of the stepladder are locked into place.  Ask someone to hold it for you, if possible.  Clearly mark and make sure that you can see:  Any grab bars or handrails.  First and last steps.  Where the edge of each step is.  Use tools that help you move around (mobility aids) if they are needed. These include:  Canes.  Walkers.  Scooters.  Crutches.  Turn on the lights when you go into a dark area. Replace any light bulbs as soon as they burn out.  Set up your furniture so you have a clear path. Avoid moving your furniture around.  If any of your floors are uneven, fix them.  If there are any pets around you, be aware of where they are.  Review your medicines with your doctor. Some medicines can make you feel dizzy. This can increase your chance of falling. Ask your doctor what other things that you can do to help prevent falls. This information is not intended  to replace advice given to you by your health care provider. Make sure you discuss any questions you have with your health care provider. Document Released: 09/04/2009 Document Revised: 04/15/2016 Document Reviewed: 12/13/2014 Elsevier Interactive Patient Education  2017 [employer].

## 2021-01-28 NOTE — Progress Notes (Signed)
Subjective:   [name] is a 70 y.o. male who presents for Medicare Annual/Subsequent preventive examination.  Review of Systems    No ROS. Medicare Wellness Visit. Additional risk factors are reflected in social history. Cardiac Risk Factors include: advanced age (>41mn, >>37women);diabetes mellitus;dyslipidemia;hypertension;male gender;obesity (BMI >30kg/m2)     Objective:    Today's Vitals   01/28/21 0856  BP: (!) 150/80  Pulse: 73  Temp: 98 F (36.7 C)  SpO2: 99%  Weight: 216 lb 3.2 oz (98.1 kg)  Height: 5' 11"  (1.803 m)  PainSc: 10-Worst pain ever   Body mass index is 30.15 kg/m.  Advanced Directives 01/28/2021 12/16/2020 07/10/2020 07/07/2020 02/24/2018 07/20/2016 10/03/2015  Does Patient Have a Medical Advance Directive? No Yes No No No Yes No  Type of Advance Directive - [place] will - - - - -  Does patient want to make changes to medical advance directive? - No - Patient declined - - - - -  Copy of [place]? - No - copy requested - - - No - copy requested -  Would patient like information on creating a medical advance directive? No - Patient declined - No - Patient declined - Yes (ED - Information included in AVS) - Yes - Educational materials given  Pre-existing out of facility DNR order (yellow form or pink MOST form) - - - - - - -    Current Medications (verified) Outpatient Encounter Medications as of 01/28/2021  Medication Sig  . amLODipine (NORVASC) 5 MG tablet Take 5 mg by mouth daily.  .Marland Kitchenaspirin EC 81 MG tablet Take 1 tablet (81 mg total) by mouth daily. Swallow whole.  .Marland Kitchenatorvastatin (LIPITOR) 40 MG tablet Take 1 tablet (40 mg total) by mouth daily.  . calcium carbonate (TUMS - DOSED IN MG ELEMENTAL CALCIUM) 500 MG chewable tablet Chew 1 tablet by mouth daily as needed for indigestion or heartburn.  . carvedilol (COREG) 12.5 MG tablet TAKE 1 & 1/2 (ONE & ONE-HALF) TABLETS BY MOUTH TWICE DAILY  .  chlorthalidone (HYGROTON) 25 MG tablet Take 1 tablet (25 mg total) by mouth daily.  . clopidogrel (PLAVIX) 75 MG tablet Take 1 tablet (75 mg total) by mouth daily. Please schedule annual appt in April for refills. Thank you  . Dulaglutide (TRULICITY) 01.16MFB/9.0XYSOPN Inject 0.5 mLs (0.75 mg total) into the skin once a week.  . furosemide (LASIX) 40 MG tablet Take 1 tablet (40 mg total) by mouth daily.  . Insulin Lispro Prot & Lispro (HUMALOG MIX 50/50 KWIKPEN) (50-50) 100 UNIT/ML Kwikpen Inject 32 Units into the skin daily with breakfast.  . losartan (COZAAR) 100 MG tablet Take 1 tablet (100 mg total) by mouth daily.  . nitroGLYCERIN (NITROSTAT) 0.4 MG SL tablet Place 1 tablet (0.4 mg total) under the tongue every 5 (five) minutes as needed for chest pain (up to 3 doses).  . RELION PEN NEEDLES 31G X 6 MM MISC USE 1  ONCE DAILY  . senna-docusate (SENOKOT-S) 8.6-50 MG per tablet Take 2 tablets by mouth 2 (two) times daily. For constipation.  . simethicone (MYLICON) 1333MG chewable tablet Chew 125 mg by mouth every 6 (six) hours as needed for flatulence.  . [DISCONTINUED] Blood Glucose Monitoring Suppl (ONETOUCH VERIO) w/Device KIT USE METER TO CHECK GLUCOSE ONCE DAILY AS DIRECTED  . [DISCONTINUED] glucose blood (ONETOUCH VERIO) test strip 1 each by Other route 2 (two) times daily. And lancets 2/day.  . [  DISCONTINUED] OneTouch Delica Lancets 35K MISC 1 each by Does not apply route 2 (two) times daily. Use to monitor glucose levels BID; E11.9   No facility-administered encounter medications on file as of 01/28/2021.    Allergies (verified) Penicillins, Pneumococcal vaccines, Shellfish allergy, Influenza vaccines, Lisinopril, Topiramate, Amlodipine, Levitra [vardenafil], Metformin, and Tadalafil   History: Past Medical History:  Diagnosis Date  . ALLERGIC RHINITIS   . CAD, NATIVE VESSEL    BMS to OM1 2001, DES to BMS 2005  . Chronic back pain    herniated disc  . Chronic combined systolic and  diastolic heart failure ([place]) 06/29/2016  . Constipation    takes Carafate four times day  . DIAB W/UNSPEC COMP TYPE II/UNSPEC TYPE UNCNTRL   . ERECTILE DYSFUNCTION   . GERD   . HYPERLIPIDEMIA-MIXED   . HYPERTENSION, BENIGN   . LBBB (left bundle branch block) 02/02/2016  . MORTON'S NEUROMA, RIGHT   . Peripheral neuropathy   . Seasonal allergies    takes Allegra and Benadryl daily prn;uses Flonase daily  . SHOULDER PAIN, RIGHT   . Snoring 09/09/2020  . 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 TIA on medication 02/2012  . Unstable angina ([place]) 07/09/2020   Past Surgical History:  Procedure Laterality Date  . ANGIOPLASTY    . COLONOSCOPY    . CORONARY ANGIOPLASTY  2005   2 stents  . CORONARY STENT INTERVENTION N/A 07/10/2020   Procedure: CORONARY STENT INTERVENTION;  Surgeon: Martinique, Peter M, MD;  Location: [facility];  Service: Cardiovascular;  Laterality: N/A;  . DENTAL SURGERY    . LARYNGOPLASTY  08/07/2012   Procedure: LARYNGOPLASTY;  Surgeon: Izora Gala, MD;  Location: [facility];  Service: ENT;  Laterality: Left;  Left Vocal Cord Medialyzation  . LEFT HEART CATH AND CORONARY ANGIOGRAPHY N/A 07/10/2020   Procedure: LEFT HEART CATH AND CORONARY ANGIOGRAPHY;  Surgeon: Martinique, Peter M, MD;  Location: [facility];  Service: Cardiovascular;  Laterality: N/A;  . left knee surgury     x 2   . stent  2001, 2004   coronary stents   Family History  Problem Relation Age of Onset  . Cancer Mother    Social History   Socioeconomic History  . Marital status: Legally Separated    Spouse name: Not on file  . Number of children: 3  . Years of education: college  . Highest education level: Not on file  Occupational History  . Occupation: disabled    Employer: [employer]  Tobacco Use  . Smoking status: Former Smoker    Packs/day: 2.50    Years: 9.00    Pack years: 22.50    Types: Cigarettes    Quit date: 02/24/1983    Years since quitting: 37.9  .  Smokeless tobacco: Never Used  Vaping Use  . Vaping Use: Never used  Substance and Sexual Activity  . Alcohol use: No    Alcohol/week: 0.0 standard drinks  . Drug use: No  . Sexual activity: Never  Other Topics Concern  . Not on file  Social History Narrative      Social Determinants of Health   Financial Resource Strain: Low Risk   . Difficulty of Paying Living Expenses: Not hard at all  Food Insecurity: No Food Insecurity  . Worried About Charity fundraiser in the Last Year: Never true  . Ran Out of Food in the Last Year: Never true  Transportation Needs: No Transportation Needs  . Lack of Transportation (Medical): No  . Lack of Transportation (Non-Medical): No  Physical Activity: Inactive  . Days of Exercise per Week: 0 days  . Minutes of Exercise per Session: 0 min  Stress: No Stress Concern Present  . Feeling of Stress : Not at all  Social Connections: Moderately Integrated  . Frequency of Communication with Friends and Family: More than three times a week  . Frequency of Social Gatherings with Friends and Family: More than three times a week  . Attends Religious Services: 1 to 4 times per year  . Active Member of Clubs or Organizations: No  . Attends Archivist Meetings: 1 to 4 times per year  . Marital Status: Never married    Tobacco Counseling Counseling given: Not Answered   Clinical Intake:  Pre-visit preparation completed: Yes  Pain : 0-10 Pain Score: 10-Worst pain ever Pain Type: Chronic pain Pain Location: Back Pain Orientation: Lower Pain Radiating Towards: Left lower extremity Pain Descriptors / Indicators: Constant,Discomfort,Dull,Aching,Sharp Pain Onset: More than a month ago Pain Frequency: Constant Pain Relieving Factors: none Effect of Pain on Daily Activities: Pain can diminish job performance, lower motivation to exercise, and prevent you from completing daily tasks. Pain produces disability and affects the quality of  life.  Pain Relieving Factors: none  BMI - recorded: 30.15 Nutritional Status: BMI > 30  Obese Nutritional Risks: None Diabetes: Yes CBG done?: No Did pt. bring in CBG monitor from home?: No  How often do you need to have someone help you when you read instructions, pamphlets, or other written materials from your doctor or pharmacy?: 1 - Never What is the last grade level you completed in school?: [place]; 1 year at [employer]  Diabetic? yes  Interpreter Needed?: No  Information entered by :: Lisette Abu, LPN   Activities of Daily Living In your present state of health, do you have any difficulty performing the following activities: 01/28/2021 07/10/2020  Hearing? N -  Vision? N -  Difficulty concentrating or making decisions? N -  Walking or climbing stairs? Y -  Comment home elevator -  Dressing or bathing? N -  Doing errands, shopping? N N  Preparing Food and eating ? N -  Using the Toilet? N -  In the past six months, have you accidently leaked urine? N -  Do you have problems with loss of bowel control? N -  Managing your Medications? N -  Managing your Finances? N -  Housekeeping or managing your Housekeeping? N -  Some recent data might be hidden    Patient Care Team: Binnie Rail, MD as PCP - General (Internal Medicine) Skeet Latch, MD as PCP - Cardiology (Cardiology) Nahser, Wonda Cheng, MD (Cardiology) Renato Shin, MD (Endocrinology) Izora Gala, MD (Otolaryngology) Garvin Fila, MD (Neurology) Almedia Balls, MD (Orthopedic Surgery) Sable Feil, MD (Gastroenterology) Wynona Canes, MD as Consulting Physician (Neurology) Charlton Haws, [facility] as Pharmacist (Pharmacist)  Indicate any recent Medical Services you may have received from other than [facility] providers in the past year (date may be approximate).     Assessment:   This is a routine wellness examination for [employer].  Hearing/Vision screen No exam  data present  Dietary issues and exercise activities discussed: Current Exercise Habits: The patient does not participate in regular exercise at present, Exercise limited by: cardiac condition(s);neurologic condition(s);orthopedic condition(s)  Goals    . Patient Stated  Eat a better diet and monitor the amount of carbohydrates and sugar.    Marland Kitchen Pharmacy Care Plan     CARE PLAN ENTRY  Current Barriers:  . Chronic Disease Management support, education, and care coordination needs related to Hypertension, Hyperlipidemia, Diabetes, Heart Failure, and Coronary Artery Disease   Hypertension / Heart Failure BP Readings from Last 3 Encounters:  10/07/20 132/78  10/07/20 116/78  09/09/20 115/75 .  Pharmacist Clinical Goal(s): o Over the next 90 days, patient will work with PharmD and providers to achieve BP goal <140/90 . Current regimen:  o Carvedilol 25 mg twice daily o Chlorthalidone 25 mg AM and 12.5 mg PM o Losartan 100 mg daily o Amlodipine 5 mg daily o Furosemide 40 mg daily as needed . Interventions: o Discussed benefits of medications for controlling BP and heart failure, and prevention of heart attack and stroke . Patient self care activities - Over the next 90 days, patient will: o Check BP daily, document, and provide at future appointments o Ensure daily salt intake < 2300 mg/day  Hyperlipidemia / Coronary artery disease Lab Results  Component Value Date/Time   LDLCALC 40 04/15/2020 11:28 AM   LDLCALC 82 07/20/2017 12:02 PM   LDLDIRECT 63.0 01/12/2016 09:31 AM   Pharmacist Clinical Goal(s): o Over the next 90 days, patient will work with PharmD and providers to maintain LDL goal < 70 . Current regimen:  o Atorvastatin 40 mg daily o Clopidogrel 75 mg daily o Nitroglycerin 0.4 mg as needed . Interventions: o Discussed benefits of medications for prevention of heart attack and stroke o Discussed when to use nitroglycerin . Patient self care activities - Over the  next 90 days, patient will: o Continue medication as prescribed o Continue low cholesterol diet and exercise routine  Diabetes Lab Results  Component Value Date/Time   HGBA1C 7.4 (A) 10/07/2020 02:51 PM   HGBA1C 7.1 (A) 08/08/2020 02:24 PM   HGBA1C 7.7 (H) 08/28/2018 10:12 AM   HGBA1C 9.3 (H) 02/24/2018 11:09 AM .  Pharmacist Clinical Goal(s): o Over the next 90 days, patient will work with PharmD and providers to achieve A1c goal <7% without hypoglycemia . Current regimen:  o Trulicity 6.43 mg once weekly o Humalog mix 50/50 32 units with breakfast . Interventions: o Discussed BG goals and benefits of medications for prevention of diabetic complications . Patient self care activities - Over the next 90 days, patient will: o Check blood sugar twice daily, document, and provide at future appointments o Contact provider with any episodes of hypoglycemia  Medication management . Pharmacist Clinical Goal(s): o Over the next 90 days, patient will work with PharmD and providers to achieve optimal medication adherence . Current pharmacy: [facility] . Interventions o Comprehensive medication review performed. o Continue current medication management strategy . Patient self care activities - Over the next 90 days, patient will: o Focus on medication adherence by fill date o Take medications as prescribed o Report any questions or concerns to PharmD and/or provider(s)  Please see past updates related to this goal by clicking on the "Past Updates" button in the selected goal     . Reduce sugar intake to X grams per day     Check out  online nutrition programs as GumSearch.nl and http://vang.com/; fit67m; or calorieking.com Look for foods with "whole" wheat; bran; oatmeal etc Shot at the farmer's markets in season for fresher choices  Watch for "hydrogenated" on the label of oils which are trans-fats.  Watch for "high  fructose corn syrup" in snacks, yogurt or ketchup  Meats have less  marbling; bright colored fruits and vegetables;  Canned; dump out liquid and wash vegetables. Be mindful of what we are eating  Portion control is essential to a health weight! Sit down; take a break and enjoy your meal; take smaller bites; put the fork down between bites;  It takes 20 minutes to get full; so check in with your fullness cues and stop eating when you start to fill full             Depression Screen PHQ 2/9 Scores 01/28/2021 10/17/2019 02/24/2018 08/24/2017 07/20/2016 09/09/2014  PHQ - 2 Score 0 0 1 0 0 1  PHQ- 9 Score - - 2 - - -    Fall Risk Fall Risk  01/28/2021 04/15/2020 10/17/2019 02/24/2018 08/24/2017  Falls in the past year? 1 0 - No Yes  Number falls in past yr: 0 0 0 - 2 or more  Injury with Fall? 1 0 - - No  Risk Factor Category  - - - - High Fall Risk  Risk for fall due to : Impaired balance/gait - - Impaired balance/gait;Impaired mobility Impaired balance/gait;Impaired mobility  Follow up Falls evaluation completed Falls evaluation completed - - -    FALL RISK PREVENTION PERTAINING TO THE HOME:  Any stairs in or around the home? Yes  If so, are there any without handrails? No  Home free of loose throw rugs in walkways, pet beds, electrical cords, etc? Yes  Adequate lighting in your home to reduce risk of falls? Yes   ASSISTIVE DEVICES UTILIZED TO PREVENT FALLS:  Life alert? No  Use of a cane, walker or w/c? Yes  Grab bars in the bathroom? Yes  Shower chair or bench in shower? Yes  Elevated toilet seat or a handicapped toilet? Yes   TIMED UP AND GO:  Was the test performed? No .  Length of time to ambulate 10 feet: 0 sec.   Gait steady and fast with assistive device  Cognitive Function: MMSE - Mini Mental State Exam 02/24/2018 07/20/2016  Not completed: - (No Data)  Orientation to time 5 -  Orientation to Place 5 -  Registration 3 -  Attention/ Calculation 5 -  Recall 1 -  Language- name 2 objects 2 -  Language- repeat 1 -  Language- follow 3  step command 3 -  Language- read & follow direction 1 -  Write a sentence 1 -  Copy design 1 -  Total score 28 -        Immunizations Immunization History  Administered Date(s) Administered  . PFIZER(Purple Top)SARS-COV-2 Vaccination 03/01/2020, 03/26/2020, 10/14/2020  . Td 11/22/2004  . Tdap 11/30/2011  . Tetanus 07/13/2012    TDAP status: Up to date  Flu Vaccine status: Declined, Education has been provided regarding the importance of this vaccine but patient still declined. Advised may receive this vaccine at local pharmacy or Health Dept. Aware to provide a copy of the vaccination record if obtained from local pharmacy or Health Dept. Verbalized acceptance and understanding.  Pneumococcal vaccine status: Declined,  Education has been provided regarding the importance of this vaccine but patient still declined. Advised may receive this vaccine at local pharmacy or Health Dept. Aware to provide a copy of the vaccination record if obtained from local pharmacy or Health Dept. Verbalized acceptance and understanding.   Covid-19 vaccine status: Completed vaccines  Qualifies for Shingles Vaccine? Yes   Zostavax completed No  Shingrix Completed?: No.    Education has been provided regarding the importance of this vaccine. Patient has been advised to call insurance company to determine out of pocket expense if they have not yet received this vaccine. Advised may also receive vaccine at local pharmacy or Health Dept. Verbalized acceptance and understanding.  Screening Tests Health Maintenance  Topic Date Due  . OPHTHALMOLOGY EXAM  09/23/2018  . INFLUENZA VACCINE  02/19/2021 (Originally 06/22/2020)  . HEMOGLOBIN A1C  04/06/2021  . FOOT EXAM  12/24/2021  . TETANUS/TDAP  07/13/2022  . Fecal DNA (Cologuard)  01/03/2024  . COVID-19 Vaccine  Completed  . Hepatitis C Screening  Completed  . HPV VACCINES  Aged Out    Health Maintenance  Health Maintenance Due  Topic Date Due  .  OPHTHALMOLOGY EXAM  09/23/2018    Colorectal cancer screening: Type of screening: Cologuard. Completed 01/02/2021. Repeat every 3 years  Lung Cancer Screening: (Low Dose CT Chest recommended if Age 15-80 years, 30 pack-year currently smoking OR have quit w/in 15years.) does not qualify.   Lung Cancer Screening Referral: no  Additional Screening:  Hepatitis C Screening: does qualify; Completed yes  Vision Screening: Recommended annual ophthalmology exams for early detection of glaucoma and other disorders of the eye. Is the patient up to date with their annual eye exam?  Yes  Who is the provider or what is the name of the office in which the patient attends annual eye exams? [place] If pt is not established with a provider, would they like to be referred to a provider to establish care? No .   Dental Screening: Recommended annual dental exams for proper oral hygiene  Community Resource Referral / Chronic Care Management: CRR required this visit?  No   CCM required this visit?  No      Plan:     I have personally reviewed and noted the following in the patient's chart:   . Medical and social history . Use of alcohol, tobacco or illicit drugs  . Current medications and supplements . Functional ability and status . Nutritional status . Physical activity . Advanced directives . List of other physicians . Hospitalizations, surgeries, and ER visits in previous 12 months . Vitals . Screenings to include cognitive, depression, and falls . Referrals and appointments  In addition, I have reviewed and discussed with patient certain preventive protocols, quality metrics, and best practice recommendations. A written personalized care plan for preventive services as well as general preventive health recommendations were provided to patient.     Sheral Flow, LPN   01/22/6711   Nurse Notes:  Normal cognitive status assessed by direct observation by  this Nurse Health Advisor. No abnormalities found.  Medications reviewed with patient; no opioid use noted.

## 2021-02-03 ENCOUNTER — Ambulatory Visit (INDEPENDENT_AMBULATORY_CARE_PROVIDER_SITE_OTHER): Payer: Medicare HMO | Admitting: Cardiovascular Disease

## 2021-02-03 ENCOUNTER — Encounter: Payer: Self-pay | Admitting: Cardiovascular Disease

## 2021-02-03 ENCOUNTER — Other Ambulatory Visit: Payer: Self-pay

## 2021-02-03 VITALS — BP 130/84 | HR 72 | Ht 71.0 in | Wt 217.4 lb

## 2021-02-03 DIAGNOSIS — I10 Essential (primary) hypertension: Secondary | ICD-10-CM | POA: Diagnosis not present

## 2021-02-03 DIAGNOSIS — I2510 Atherosclerotic heart disease of native coronary artery without angina pectoris: Secondary | ICD-10-CM | POA: Diagnosis not present

## 2021-02-03 DIAGNOSIS — I447 Left bundle-branch block, unspecified: Secondary | ICD-10-CM

## 2021-02-03 DIAGNOSIS — I6330 Cerebral infarction due to thrombosis of unspecified cerebral artery: Secondary | ICD-10-CM

## 2021-02-03 DIAGNOSIS — I5042 Chronic combined systolic (congestive) and diastolic (congestive) heart failure: Secondary | ICD-10-CM

## 2021-02-03 DIAGNOSIS — Z9861 Coronary angioplasty status: Secondary | ICD-10-CM

## 2021-02-03 DIAGNOSIS — Z6830 Body mass index (BMI) 30.0-30.9, adult: Secondary | ICD-10-CM

## 2021-02-03 MED ORDER — BLOOD PRESSURE CUFF MISC: 1.0000 [IU] | Freq: Every day | 0 refills | Status: AC

## 2021-02-03 NOTE — Patient Instructions (Signed)
Medication Instructions:  No Changes In Medications at this time.  *If you need a refill on your cardiac medications before your next appointment, please call your pharmacy*  Follow-Up: At [facility], you and your health needs are our priority.  As part of our continuing mission to provide you with exceptional heart care, we have created designated Provider Care Teams.  These Care Teams include your primary Cardiologist (physician) and Advanced Practice Providers (APPs -  Physician Assistants and Nurse Practitioners) who all work together to provide you with the care you need, when you need it.  Your next appointment:   6 month(s)  The format for your next appointment:   In Person  Provider:   Skeet Latch, MD  Other Instructions REFERRAL TO [place]

## 2021-02-03 NOTE — Progress Notes (Signed)
Virtual Visit via Video Note   This visit type was conducted due to national recommendations for restrictions regarding the COVID-19 Pandemic (e.g. social distancing) in an effort to limit this patient's exposure and mitigate transmission in our community.  Due to his co-morbid illnesses, this patient is at least at moderate risk for complications without adequate follow up.  This format is felt to be most appropriate for this patient at this time.  All issues noted in this document were discussed and addressed.  A limited physical exam was performed with this format.  Please refer to the patient's chart for his consent to telehealth for [facility]  Video Connection Lost Video connection was lost at > 50% of the duration of this visit, at which time the remainder of the visit was completed via audio only.     The patient was identified using 2 identifiers.  Date:  02/03/2021   ID:  [name], DOB November 23, 1950, MRN [id number]  Patient Location: Home Provider Location: Office/Clinic  PCP:  Binnie Rail, MD  Cardiologist:  Skeet Latch, MD  Electrophysiologist:  None  Nephrologist: Dr. Joylene Grapes  Evaluation Performed:  Follow-Up Visit  Chief Complaint:  hypertension  History of Present Illness:     The patient does not have symptoms concerning for COVID-19 infection (fever, chills, cough, or new shortness of breath).   [name] is a 70 y.o. male with hypertension, CAD status post multiple PCI, stroke, CVA, diabetes mellitus type 2, hyperlipidemia, and prior tobacco abuse who presents for follow up.  [name] was referred by Dr. Billey Walker on 02/09/16. [name] and PCI with a BMS to OM while living in [place].  He had in-stent restenosis in 2005 and had a DES placed.   At that time he reported occasional episodes of chest pain relieved by aspirin.  He denied exertional chest pain but did note dizziness and nausea.  Additionally, he was noted to have a left  bundle branch block that was not present on his last EKG in 2013. Therefore, he was referred for exercise Myoview 02/13/16 that revealed LVEF 39% with global hypokinesis but no ischemia.  Echo 03/01/16 revealed LV 40-45% with mild LVH and grade 2 diastolic dysfunction. There was also mild hypokinesis of the mid to apical anteroseptum.   He works with our pharmacists and has achieved very good blood pressure control.  He is in a program at the [place] that checks his BP daily.    Mr. Cowgill was seen in the ED 06/2020 with a complaint of two months of chest pain, abdominal pain, back pain, and gas.  High-sensitivity troponin was elevated at 32.  EKG showed sinus rhythm with a left bundle branch block, which is chronic.  He left prior to receiving a follow-up lab because of COVID-19 related back up in the ED.  He followed up in the office a couple days later and high-sensitivity troponin was 124.  He was direct admitted to the hospital where he underwent cardiac catheterization.  His cath 8/19 revealed 95% stenosis of the ostial OM 2 just proximal to his prior OM stent.  A drug-eluting stent was successfully placed.  In follow-up he continued to be much better.  He had a repeat echo 07/2020 that revealed an improvement in his LVEF to 55% with grade 1 diastolic dysfunction.  Right atrial pressure was 3 mmHg.  At his last appointment he was referred for sleep study and found to have severe sleep apnea.  He is  currently awaiting a CPAP machine.  It should be here by the end of the month.  He does continue to have some exertional shortness of breath.  He is not getting any exercise.  He denies any chest pain or pressure.  He has no lower extremity edema, orthopnea, or PND.  He has been using Lasix only as needed.  He is unable to check his blood pressure at home because he does not have a cuff.  Past Medical History:  Diagnosis Date  . ALLERGIC RHINITIS   . CAD, NATIVE VESSEL    BMS to OM1 2001, DES to BMS 2005  . Chronic  back pain    herniated disc  . Chronic combined systolic and diastolic heart failure ([place]) 06/29/2016  . Constipation    takes Carafate four times day  . DIAB W/UNSPEC COMP TYPE II/UNSPEC TYPE UNCNTRL   . ERECTILE DYSFUNCTION   . GERD   . HYPERLIPIDEMIA-MIXED   . HYPERTENSION, BENIGN   . LBBB (left bundle branch block) 02/02/2016  . MORTON'S NEUROMA, RIGHT   . Peripheral neuropathy   . Seasonal allergies    takes Allegra and Benadryl daily prn;uses Flonase daily  . SHOULDER PAIN, RIGHT   . Snoring 09/09/2020  . 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 TIA on medication 02/2012  . Unstable angina ([place]) 07/09/2020    Past Surgical History:  Procedure Laterality Date  . ANGIOPLASTY    . COLONOSCOPY    . CORONARY ANGIOPLASTY  2005   2 stents  . CORONARY STENT INTERVENTION N/A 07/10/2020   Procedure: CORONARY STENT INTERVENTION;  Surgeon: Martinique, Peter M, MD;  Location: [place] [facility];  Service: Cardiovascular;  Laterality: N/A;  . DENTAL SURGERY    . LARYNGOPLASTY  08/07/2012   Procedure: LARYNGOPLASTY;  Surgeon: Izora Gala, MD;  Location: [place];  Service: ENT;  Laterality: Left;  Left Vocal Cord Medialyzation  . LEFT HEART CATH AND CORONARY ANGIOGRAPHY N/A 07/10/2020   Procedure: LEFT HEART CATH AND CORONARY ANGIOGRAPHY;  Surgeon: Martinique, Peter M, MD;  Location: [place] [facility];  Service: Cardiovascular;  Laterality: N/A;  . left knee surgury     x 2   . stent  2001, 2004   coronary stents     Current Outpatient Medications  Medication Sig Dispense Refill  . amLODipine (NORVASC) 5 MG tablet Take 5 mg by mouth daily.    Marland Kitchen aspirin EC 81 MG tablet Take 1 tablet (81 mg total) by mouth daily. Swallow whole. 30 tablet 11  . atorvastatin (LIPITOR) 40 MG tablet Take 1 tablet (40 mg total) by mouth daily. 90 tablet 3  . Blood Glucose Monitoring Suppl (ONETOUCH VERIO) w/Device KIT USE METER TO CHECK GLUCOSE ONCE DAILY AS DIRECTED 1 kit 0  . Blood Pressure  Monitoring (BLOOD PRESSURE CUFF) MISC 1 Units by Does not apply route daily. 1 each 0  . calcium carbonate (TUMS - DOSED IN MG ELEMENTAL CALCIUM) 500 MG chewable tablet Chew 1 tablet by mouth daily as needed for indigestion or heartburn.    . carvedilol (COREG) 12.5 MG tablet TAKE 1 & 1/2 (ONE & ONE-HALF) TABLETS BY MOUTH TWICE DAILY 270 tablet 2  . chlorthalidone (HYGROTON) 25 MG tablet Take 1 tablet (25 mg total) by mouth daily. 90 tablet 3  . clopidogrel (PLAVIX) 75 MG tablet Take 1 tablet (75 mg total) by mouth daily. Please schedule annual appt in April for refills. Thank you  90 tablet 3  . Dulaglutide (TRULICITY) 6.38 VF/6.4PP SOPN Inject 0.5 mLs (0.75 mg total) into the skin once a week. 6 mL 3  . furosemide (LASIX) 40 MG tablet Take 1 tablet (40 mg total) by mouth daily. 90 tablet 2  . glucose blood (ONETOUCH VERIO) test strip 1 each by Other route 2 (two) times daily. And lancets 2/day. 200 each 3  . Insulin Lispro Prot & Lispro (HUMALOG MIX 50/50 KWIKPEN) (50-50) 100 UNIT/ML Kwikpen Inject 32 Units into the skin daily with breakfast.    . losartan (COZAAR) 100 MG tablet Take 1 tablet (100 mg total) by mouth daily. 90 tablet 3  . nitroGLYCERIN (NITROSTAT) 0.4 MG SL tablet Place 1 tablet (0.4 mg total) under the tongue every 5 (five) minutes as needed for chest pain (up to 3 doses). 25 tablet 3  . OneTouch Delica Lancets 29J MISC 1 each by Does not apply route 2 (two) times daily. Use to monitor glucose levels BID; E11.9 100 each 3  . RELION PEN NEEDLES 31G X 6 MM MISC USE 1  ONCE DAILY 50 each 0  . senna-docusate (SENOKOT-S) 8.6-50 MG per tablet Take 2 tablets by mouth 2 (two) times daily. For constipation.    . simethicone (MYLICON) 188 MG chewable tablet Chew 125 mg by mouth every 6 (six) hours as needed for flatulence.     No current facility-administered medications for this visit.    Allergies:   Penicillins, Pneumococcal vaccines, Shellfish allergy, Influenza vaccines, Lisinopril,  Topiramate, Amlodipine, Levitra [vardenafil], Metformin, and Tadalafil    Social History:  The patient  reports that he quit smoking about 37 years ago. His smoking use included cigarettes. He has a 22.50 pack-year smoking history. He has never used smokeless tobacco. He reports that he does not drink alcohol and does not use drugs.   Family History:  The patient's family history includes Cancer in his mother.    ROS:  Please see the history of present illness.   Otherwise, review of systems are positive for L side weakness since his stroke and cough.  All other systems are reviewed and negative.    PHYSICAL EXAM: VS:  BP 130/84   Pulse 72   Ht 5' 11"  (1.803 m)   Wt 217 lb 6.4 oz (98.6 kg)   SpO2 99%   BMI 30.32 kg/m  , BMI Body mass index is 30.32 kg/m. GENERAL:  Well appearing HEENT: Pupils equal round and reactive, fundi not visualized, oral mucosa unremarkable NECK:  No jugular venous distention, waveform within normal limits, carotid upstroke brisk and symmetric, no bruits LUNGS:  Clear to auscultation bilaterally HEART:  RRR.  PMI not displaced or sustained,S1 and S2 within normal limits, no S3, no S4, no clicks, no rubs, no  murmurs ABD:  Flat, positive bowel sounds normal in frequency in pitch, no bruits, no rebound, no guarding, no midline pulsatile mass, no hepatomegaly, no splenomegaly EXT:  2 plus pulses throughout, no edema, no cyanosis no clubbing SKIN:  No rashes no nodules NEURO:  L sided weakness and foot drop.  L arm intension tremor PSYCH:  Cognitively intact, oriented to person place and time   EKG:  EKG is ordered today. The ekg ordered 3//13/17 demonstrates sinus rhythm rate 64 bpm.  LBBB.   07/20/17: Sinus rhythm. Rate 74 bpm. Left bundle branch block. 09/01/18; Sinus rhythm.  Rate 71 bpm.  LBBB 07/09/20: Sinus rhythm.  Rate 91 bpm.  LBBB. 07/16/20: Sinus rhythm.  Rate 80 bpm.  LBBB. 08/08/2020: Sinus rhythm.  Rate 74 bpm.  Left bundle branch block. 02/03/21:  Sinus rhythm.  Rate 72 bpm.  Left bundle branch block.  LHC 07/10/20:   2nd Mrg-1 lesion is 95% stenosed.  2nd Mrg-2 lesion is 30% stenosed.  RPDA lesion is 100% stenosed.  A drug-eluting stent was successfully placed using a STENT RESOLUTE ONYX 2.75X8.  Post intervention, there is a 0% residual stenosis.  Post intervention, there is a 0% residual stenosis.  LV end diastolic pressure is normal.   1. 2 vessel obstructive CAD    -95% ostial OM2. This was at the proximal margin of the old stents    - 100% distal PDA. This fills by left to right collaterals. 2. Normal LVEDP 3. Successful PCI of the ostium of the OM with DES x 1.  Plan: DAPT indefinitely given multiple stents in OM. Anticipate same day DC. Will hold losartan and HCTZ until renal function repeated as outpatient.    Echo 09/20/18: Study Conclusions   - Left ventricle: abnormal septal motion. The cavity size was  mildly dilated. Wall thickness was increased in a pattern of  severe LVH. Systolic function was mildly to moderately reduced.  The estimated ejection fraction was in the range of 40% to 45%.  Doppler parameters are consistent with abnormal left ventricular  relaxation (grade 1 diastolic dysfunction).  - Atrial septum: There was increased thickness of the septum,  consistent with lipomatous hypertrophy. No defect or patent  foramen ovale was identified.   Echo 07/2020: IMPRESSIONS   1. Left ventricular ejection fraction, by estimation, is 55%. The left  ventricle has normal function. The left ventricle has no regional wall  motion abnormalities but there was septal-lateral dyssynchrony consistent  with LBBB. Left ventricular diastolic  parameters are consistent with Grade I diastolic dysfunction (impaired  relaxation).  2. Right ventricular systolic function is normal. The right ventricular  size is normal. There is normal pulmonary artery systolic pressure. The  estimated right  ventricular systolic pressure is 42.8 mmHg.  3. The aortic valve is tricuspid. Aortic valve regurgitation is not  visualized. No aortic stenosis is present.  4. The mitral valve is normal in structure. Trivial mitral valve  regurgitation. No evidence of mitral stenosis.  5. The inferior vena cava is normal in size with greater than 50%  respiratory variability, suggesting right atrial pressure of 3 mmHg.   Carotid Doppler 10/24/14: Normal study  Lexiscan Myoview 10/2018:  Nuclear stress EF: 43%. The left ventricular ejection fraction is moderately decreased (30-44%).  Defect 1: There is a medium defect of moderate severity present in the mid anteroseptal, mid inferoseptal, apical septal and apex location. This septal defect is likely due to the LBBB.  This is an intermediate risk study. This study is similar to the previous study in 2017.  ABI 08/2018: Normal bilaterally  Recent Labs: 07/07/2020: ALT 28 07/10/2020: Hemoglobin 9.7; Platelets 190 09/10/2020: BUN 41; Creatinine, Ser 2.29; Potassium 3.8; Sodium 142    Lipid Panel    Component Value Date/Time   CHOL 112 04/15/2020 1128   CHOL 164 07/20/2017 1202   TRIG 136.0 04/15/2020 1128   TRIG 200 04/20/2009 0000   HDL 44.60 04/15/2020 1128   HDL 55 07/20/2017 1202   CHOLHDL 3 04/15/2020 1128   VLDL 27.2 04/15/2020 1128   LDLCALC 40 04/15/2020 1128   LDLCALC 82 07/20/2017 1202   LDLDIRECT 63.0 01/12/2016 0931    Wt Readings from Last 3 Encounters:  02/03/21 217 lb  6.4 oz (98.6 kg)  01/28/21 216 lb 3.2 oz (98.1 kg)  12/16/20 220 lb (99.8 kg)    11/03/2020: Sodium 143, potassium 4.2, BUN 27, creatinine 1.68  ASSESSMENT AND PLAN:  # CAD s/p PCI x2: # NSTEMI:  Mr. Tuite underwent successful PCI of OM 2.  He is feeling much better.  He will continue on indefinite dual antiplatelet therapy per Dr. Martinique.  Continue aspirin, clopidogrel, carvedilol, and atorvastatin.  # Chronic systolic and diastolic heart failure:   # Hypertension: # CKD IV: LVEF was 40 to 45% previously and improved to 55% post cath.  Continue carvedilol, chlorthalidone, and losartan.  He rarely needs to use furosemide. Consider Wilder Glade.  He sees his endocrinologist later this week.  # Hyperlipidemia: LDL 40 on 03/2020.  Continue atorvastatin.  Repeat lipids at follow-up.  # Snorning:  # Orthopnea:  # Daytime somnolence:  Found to have sleep apnea.  Awaiting his CPAP machine.  Current medicines are reviewed at length with the patient today.  The patient does not have concerns regarding medicines.  The following changes have been made: No changes  Labs/ tests ordered today include:    Orders Placed This Encounter  Procedures  . Amb Referral To Provider Referral Exercise Program (P.R.E.P)     Disposition:   FU with Minha Fulco C. Oval Linsey, MD, [facility] in 6 months.     Signed, Rowan Pollman C. Oval Linsey, MD, [facility]  02/03/2021 9:31 AM    [place]

## 2021-02-03 NOTE — Addendum Note (Signed)
Addended by: Rexanne Mano B on: 02/03/2021 10:38 AM   Modules accepted: Orders

## 2021-02-05 ENCOUNTER — Ambulatory Visit (INDEPENDENT_AMBULATORY_CARE_PROVIDER_SITE_OTHER): Payer: Medicare HMO | Admitting: Endocrinology

## 2021-02-05 ENCOUNTER — Other Ambulatory Visit: Payer: Self-pay

## 2021-02-05 VITALS — BP 130/70 | HR 90 | Ht 71.0 in | Wt 219.2 lb

## 2021-02-05 DIAGNOSIS — Z794 Long term (current) use of insulin: Secondary | ICD-10-CM

## 2021-02-05 DIAGNOSIS — E119 Type 2 diabetes mellitus without complications: Secondary | ICD-10-CM

## 2021-02-05 LAB — POCT GLYCOSYLATED HEMOGLOBIN (HGB A1C): Hemoglobin A1C: 6.6 % — AB (ref 4.0–5.6)

## 2021-02-05 NOTE — Progress Notes (Signed)
Subjective:    Patient ID: [name], male    DOB: [birth year], 70 y.o.   MRN: [id number]  HPI Pt returns for f/u of diabetes mellitus:  DM type: Insulin-requiring type 2 Dx'ed: 0947 Complications: PN, CAD, CRI, DR, and TIA.   Therapy: insulin since 0962, and Trulicity.   DKA: never.   Severe hypoglycemia: last episode was early 2020.   Pancreatitis: never.  SDOH: he gets meds from [facility]--pt assistance.   Other: He says he cannot use syringe and vial, due to tremor; he changed lantus to NPH, then 70/30, then 50/50, due to pattern on cbg's; he declines multiple daily injections;   Trulicity dosage is limited by cough and sob; fructosamine has confirmed A1c.  Interval history: He says he never misses the meds, but he has not recently checked cbg.  pt states he feels well in general.  No recent steroids.   Past Medical History:  Diagnosis Date  . ALLERGIC RHINITIS   . CAD, NATIVE VESSEL    BMS to OM1 2001, DES to BMS 2005  . Chronic back pain    herniated disc  . Chronic combined systolic and diastolic heart failure ([place]) 06/29/2016  . Constipation    takes Carafate four times day  . DIAB W/UNSPEC COMP TYPE II/UNSPEC TYPE UNCNTRL   . ERECTILE DYSFUNCTION   . GERD   . HYPERLIPIDEMIA-MIXED   . HYPERTENSION, BENIGN   . LBBB (left bundle branch block) 02/02/2016  . MORTON'S NEUROMA, RIGHT   . Peripheral neuropathy   . Seasonal allergies    takes Allegra and Benadryl daily prn;uses Flonase daily  . SHOULDER PAIN, RIGHT   . Snoring 09/09/2020  . 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 TIA on medication 02/2012  . Unstable angina ([place]) 07/09/2020    Past Surgical History:  Procedure Laterality Date  . ANGIOPLASTY    . COLONOSCOPY    . CORONARY ANGIOPLASTY  2005   2 stents  . CORONARY STENT INTERVENTION N/A 07/10/2020   Procedure: CORONARY STENT INTERVENTION;  Surgeon: Martinique, Peter M, MD;  Location: [facility] [facility];  Service: Cardiovascular;   Laterality: N/A;  . DENTAL SURGERY    . LARYNGOPLASTY  08/07/2012   Procedure: LARYNGOPLASTY;  Surgeon: Izora Gala, MD;  Location: [facility];  Service: ENT;  Laterality: Left;  Left Vocal Cord Medialyzation  . LEFT HEART CATH AND CORONARY ANGIOGRAPHY N/A 07/10/2020   Procedure: LEFT HEART CATH AND CORONARY ANGIOGRAPHY;  Surgeon: Martinique, Peter M, MD;  Location: [facility];  Service: Cardiovascular;  Laterality: N/A;  . left knee surgury     x 2   . stent  2001, 2004   coronary stents    Social History   Socioeconomic History  . Marital status: Legally Separated    Spouse name: Not on file  . Number of children: 3  . Years of education: college  . Highest education level: Not on file  Occupational History  . Occupation: disabled    Employer: [employer]  Tobacco Use  . Smoking status: Former Smoker    Packs/day: 2.50    Years: 9.00    Pack years: 22.50    Types: Cigarettes    Quit date: 02/24/1983    Years since quitting: 37.9  . Smokeless tobacco: Never Used  Vaping Use  . Vaping Use: Never used  Substance and Sexual Activity  . Alcohol use: No    Alcohol/week: 0.0 standard drinks  .  Drug use: No  . Sexual activity: Never  Other Topics Concern  . Not on file  Social History Narrative      Social Determinants of Health   Financial Resource Strain: Low Risk   . Difficulty of Paying Living Expenses: Not hard at all  Food Insecurity: No Food Insecurity  . Worried About Charity fundraiser in the Last Year: Never true  . Ran Out of Food in the Last Year: Never true  Transportation Needs: No Transportation Needs  . Lack of Transportation (Medical): No  . Lack of Transportation (Non-Medical): No  Physical Activity: Inactive  . Days of Exercise per Week: 0 days  . Minutes of Exercise per Session: 0 min  Stress: No Stress Concern Present  . Feeling of Stress : Not at all  Social Connections: Moderately Integrated  . Frequency of Communication with Friends and Family:  More than three times a week  . Frequency of Social Gatherings with Friends and Family: More than three times a week  . Attends Religious Services: 1 to 4 times per year  . Active Member of Clubs or Organizations: No  . Attends Archivist Meetings: 1 to 4 times per year  . Marital Status: Never married  Intimate Partner Violence: Not on file    Current Outpatient Medications on File Prior to Visit  Medication Sig Dispense Refill  . amLODipine (NORVASC) 5 MG tablet Take 5 mg by mouth daily.    Marland Kitchen aspirin EC 81 MG tablet Take 1 tablet (81 mg total) by mouth daily. Swallow whole. 30 tablet 11  . atorvastatin (LIPITOR) 40 MG tablet Take 1 tablet (40 mg total) by mouth daily. 90 tablet 3  . Blood Glucose Monitoring Suppl (ONETOUCH VERIO) w/Device KIT USE METER TO CHECK GLUCOSE ONCE DAILY AS DIRECTED 1 kit 0  . Blood Pressure Monitoring (BLOOD PRESSURE CUFF) MISC 1 Units by Does not apply route daily. 1 each 0  . calcium carbonate (TUMS - DOSED IN MG ELEMENTAL CALCIUM) 500 MG chewable tablet Chew 1 tablet by mouth daily as needed for indigestion or heartburn.    . carvedilol (COREG) 12.5 MG tablet TAKE 1 & 1/2 (ONE & ONE-HALF) TABLETS BY MOUTH TWICE DAILY 270 tablet 2  . chlorthalidone (HYGROTON) 25 MG tablet Take 1 tablet (25 mg total) by mouth daily. 90 tablet 3  . clopidogrel (PLAVIX) 75 MG tablet Take 1 tablet (75 mg total) by mouth daily. Please schedule annual appt in April for refills. Thank you 90 tablet 3  . Dulaglutide (TRULICITY) 2.90 SX/1.1BZ SOPN Inject 0.5 mLs (0.75 mg total) into the skin once a week. 6 mL 3  . furosemide (LASIX) 40 MG tablet Take 1 tablet (40 mg total) by mouth daily. 90 tablet 2  . glucose blood (ONETOUCH VERIO) test strip 1 each by Other route 2 (two) times daily. And lancets 2/day. 200 each 3  . Insulin Lispro Prot & Lispro (HUMALOG MIX 50/50 KWIKPEN) (50-50) 100 UNIT/ML Kwikpen Inject 32 Units into the skin daily with breakfast.    . losartan (COZAAR)  100 MG tablet Take 1 tablet (100 mg total) by mouth daily. 90 tablet 3  . nitroGLYCERIN (NITROSTAT) 0.4 MG SL tablet Place 1 tablet (0.4 mg total) under the tongue every 5 (five) minutes as needed for chest pain (up to 3 doses). 25 tablet 3  . OneTouch Delica Lancets 20E MISC 1 each by Does not apply route 2 (two) times daily. Use to monitor glucose levels BID;  E11.9 100 each 3  . RELION PEN NEEDLES 31G X 6 MM MISC USE 1  ONCE DAILY 50 each 0  . senna-docusate (SENOKOT-S) 8.6-50 MG per tablet Take 2 tablets by mouth 2 (two) times daily. For constipation.    . simethicone (MYLICON) 629 MG chewable tablet Chew 125 mg by mouth every 6 (six) hours as needed for flatulence.     No current facility-administered medications on file prior to visit.    Allergies  Allergen Reactions  . Penicillins Anaphylaxis  . Pneumococcal Vaccines Anaphylaxis  . Shellfish Allergy Anaphylaxis  . Influenza Vaccines   . Lisinopril Cough  . Topiramate Other (See Comments)    Chest spasms and numbness  . Amlodipine Other (See Comments)    LE edema  . Levitra [Vardenafil] Other (See Comments)    headaches  . Metformin Nausea And Vomiting  . Tadalafil Other (See Comments)    dizziness    Family History  Problem Relation Age of Onset  . Cancer Mother     BP 130/70 (BP Location: Right Arm, Patient Position: Sitting, Cuff Size: Large)   Pulse 90   Ht _0  (1.803 m)   Wt 219 lb 3.2 oz (99.4 kg)   SpO2 99%   BMI 30.57 kg/m    Review of Systems     Objective:   Physical Exam VITAL SIGNS:  See vs page GENERAL: no distress Pulses: dorsalis pedis intact bilat.   MSK: no deformity of the feet CV: no leg edema Skin:  no ulcer on the feet.  normal color and temp on the feet. Neuro: sensation is intact to touch on the feet, but decreased from normal.     Lab Results  Component Value Date   CREATININE 2.29 (H) 09/10/2020   BUN 41 (H) 09/10/2020   NA 142 09/10/2020   K 3.8 09/10/2020   CL 100  09/10/2020   CO2 29 09/10/2020   Lab Results  Component Value Date   HGBA1C 6.6 (A) 02/05/2021       Assessment & Plan:  Insulin-requiring type 2 DM.   Stage 4 CRI: I advised against SGLT, with this renal function.   Check fructosamine.    Patient Instructions  Please continue the same Trulicity and insulin.   On this type of insulin schedule, you should eat meals on a regular schedule (especially breakfast and lunch).  If a meal is missed or significantly delayed, your blood sugar could go low.   Blood tests are requested for you today.  We'll let you know about the results.  Please come back for a follow-up appointment in 3 months.   check your blood sugar twice a day.  vary the time of day when you check, between before the 3 meals, and at bedtime.  also check if you have symptoms of your blood sugar being too high or too low.  please keep a record of the readings and bring it to your next appointment here (or you can bring the meter itself).  You can write it on any piece of paper.  please call us sooner if your blood sugar goes below 70, or if you have a lot of readings over 200.

## 2021-02-05 NOTE — Patient Instructions (Addendum)
Please continue the same Trulicity and insulin.   On this type of insulin schedule, you should eat meals on a regular schedule (especially breakfast and lunch).  If a meal is missed or significantly delayed, your blood sugar could go low.   Blood tests are requested for you today.  We'll let you know about the results.  Please come back for a follow-up appointment in 3 months.   check your blood sugar twice a day.  vary the time of day when you check, between before the 3 meals, and at bedtime.  also check if you have symptoms of your blood sugar being too high or too low.  please keep a record of the readings and bring it to your next appointment here (or you can bring the meter itself).  You can write it on any piece of paper.  please call us sooner if your blood sugar goes below 70, or if you have a lot of readings over 200.

## 2021-02-06 ENCOUNTER — Telehealth: Payer: Self-pay

## 2021-02-06 NOTE — Telephone Encounter (Signed)
Call to pt reference referral to [place] program to pt Is interested, Already a member of [employer] afternoon class  Explained I will call him back when we are starting our next afternoon class.  Confirmed he has my number for questions and call back

## 2021-02-09 LAB — FRUCTOSAMINE: Fructosamine: 371 umol/L — ABNORMAL HIGH (ref 205–285)

## 2021-02-19 NOTE — Telephone Encounter (Signed)
Error

## 2021-02-20 ENCOUNTER — Telehealth: Payer: Self-pay

## 2021-02-20 NOTE — Telephone Encounter (Signed)
Spoke with pt to let him know that we received a paper from [employer] that it is coming up for the pt to reenroll in the program to continue assistance for his Trulicity. Advise him that he can go to the wed site and print off the papers and fill out his portion and then get the paper his to the office so that we can fill the providers portion out.

## 2021-02-25 ENCOUNTER — Telehealth: Payer: Self-pay | Admitting: Endocrinology

## 2021-02-25 NOTE — Telephone Encounter (Signed)
New Message  Pt Dropped off Patient assistance Program for [facility] paperwork.

## 2021-03-16 ENCOUNTER — Other Ambulatory Visit: Payer: Self-pay | Admitting: Endocrinology

## 2021-03-20 ENCOUNTER — Telehealth: Payer: Self-pay

## 2021-03-20 NOTE — Telephone Encounter (Signed)
Received fax from [employer] pt is approved for Trulicity for 12 months

## 2021-03-25 ENCOUNTER — Ambulatory Visit (INDEPENDENT_AMBULATORY_CARE_PROVIDER_SITE_OTHER): Payer: Medicare HMO | Admitting: Podiatry

## 2021-03-25 ENCOUNTER — Other Ambulatory Visit: Payer: Self-pay

## 2021-03-25 ENCOUNTER — Encounter: Payer: Self-pay | Admitting: Podiatry

## 2021-03-25 DIAGNOSIS — M79675 Pain in left toe(s): Secondary | ICD-10-CM | POA: Diagnosis not present

## 2021-03-25 DIAGNOSIS — F329 Major depressive disorder, single episode, unspecified: Secondary | ICD-10-CM | POA: Insufficient documentation

## 2021-03-25 DIAGNOSIS — J3800 Paralysis of vocal cords and larynx, unspecified: Secondary | ICD-10-CM | POA: Insufficient documentation

## 2021-03-25 DIAGNOSIS — F341 Dysthymic disorder: Secondary | ICD-10-CM | POA: Insufficient documentation

## 2021-03-25 DIAGNOSIS — R490 Dysphonia: Secondary | ICD-10-CM | POA: Insufficient documentation

## 2021-03-25 DIAGNOSIS — I2510 Atherosclerotic heart disease of native coronary artery without angina pectoris: Secondary | ICD-10-CM | POA: Insufficient documentation

## 2021-03-25 DIAGNOSIS — F331 Major depressive disorder, recurrent, moderate: Secondary | ICD-10-CM | POA: Insufficient documentation

## 2021-03-25 DIAGNOSIS — B351 Tinea unguium: Secondary | ICD-10-CM

## 2021-03-25 DIAGNOSIS — Z0389 Encounter for observation for other suspected diseases and conditions ruled out: Secondary | ICD-10-CM | POA: Insufficient documentation

## 2021-03-25 DIAGNOSIS — M792 Neuralgia and neuritis, unspecified: Secondary | ICD-10-CM

## 2021-03-25 DIAGNOSIS — E1142 Type 2 diabetes mellitus with diabetic polyneuropathy: Secondary | ICD-10-CM | POA: Diagnosis not present

## 2021-03-25 DIAGNOSIS — M79674 Pain in right toe(s): Secondary | ICD-10-CM

## 2021-03-25 NOTE — Progress Notes (Signed)
This patient returns to my office for at risk foot care.  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  Patient is taking plavix.  Patient has history of CVA.  This patient is unable to cut nails himself since the patient cannot reach his nails.These nails are painful walking and wearing shoes.  This patient presents for at risk foot care today.  General Appearance  Alert, conversant and in no acute stress.  Vascular  Dorsalis pedis and posterior tibial  pulses are weakly  palpable  bilaterally.  Capillary return is within normal limits  Bilaterally.Cold feet  Bilaterally.  Absent digital hair  B/L.  Neurologic  Senn-Weinstein monofilament wire test diminished  bilaterally. Muscle power within normal limits bilaterally.  Nails Thick disfigured discolored nails with subungual debris  from hallux to fifth toes bilaterally. No evidence of bacterial infection or drainage bilaterally.  Orthopedic  No limitations of motion  feet .  No crepitus or effusions noted.  No bony pathology or digital deformities noted.  Skin  normotropic skin with no porokeratosis noted bilaterally.  No signs of infections or ulcers noted.  Midfoot DJD right.  Hammer toes  B/L.   Onychomycosis  Pain in right toes  Pain in left toes  Consent was obtained for treatment procedures.   Mechanical debridement of nails 1-5  bilaterally performed with a nail nipper.  Filed with dremel without incident.    Return office visit   3 months                   Told patient to return for periodic foot care and evaluation due to potential at risk complications.   Gardiner Barefoot DPM

## 2021-04-06 ENCOUNTER — Telehealth: Payer: Self-pay | Admitting: Pharmacist

## 2021-04-07 NOTE — Progress Notes (Signed)
Chronic Care Management Pharmacy Assistant   Name: [name]  MRN: [id number] DOB: 08-28-1951   Reason for Encounter: Disease State Diabetic Call   Conditions to be addressed/monitored: DMII   Recent office visits:  None ID  Recent consult visits:  02/03/21 [name] Cardiology 02/05/21 [name] 03/25/21 [name] DPM, [facility] visits:  None in previous 6 months  Medications: Outpatient Encounter Medications as of 04/06/2021  Medication Sig  . albuterol (VENTOLIN HFA) 108 (90 Base) MCG/ACT inhaler Inhale 2 puffs into the lungs every 6 (six) hours as needed.  Marland Kitchen amLODipine (NORVASC) 5 MG tablet Take 5 mg by mouth daily.  Marland Kitchen aspirin EC 81 MG tablet Take 1 tablet (81 mg total) by mouth daily. Swallow whole.  Marland Kitchen atenolol (TENORMIN) 25 MG tablet Take by mouth.  Marland Kitchen atorvastatin (LIPITOR) 40 MG tablet Take 1 tablet (40 mg total) by mouth daily.  . Blood Glucose Monitoring Suppl (ONETOUCH VERIO) w/Device KIT USE METER TO CHECK GLUCOSE ONCE DAILY AS DIRECTED  . Blood Pressure Monitoring (BLOOD PRESSURE CUFF) MISC 1 Units by Does not apply route daily.  . calcium carbonate (OS-CAL) 1250 (500 Ca) MG chewable tablet CHEW TWO TABLETS BY MOUTH TWICE A DAY  . calcium carbonate (TUMS - DOSED IN MG ELEMENTAL CALCIUM) 500 MG chewable tablet Chew 1 tablet by mouth daily as needed for indigestion or heartburn.  . carvedilol (COREG) 12.5 MG tablet TAKE 1 & 1/2 (ONE & ONE-HALF) TABLETS BY MOUTH TWICE DAILY  . cetirizine (ZYRTEC) 10 MG tablet Take 1 tablet by mouth daily as needed.  . chlorthalidone (HYGROTON) 25 MG tablet Take 1 tablet (25 mg total) by mouth daily.  . clopidogrel (PLAVIX) 75 MG tablet Take 1 tablet (75 mg total) by mouth daily. Please schedule annual appt in April for refills. Thank you  . Dulaglutide (TRULICITY) 0.07 HQ/1.9XJ SOPN Inject 0.5 mLs (0.75 mg total) into the skin once a week.  . fluticasone (FLONASE) 50 MCG/ACT nasal spray INSTILL  1 SPRAY IN EACH NOSTRIL DAILY AS NEEDED  . furosemide (LASIX) 40 MG tablet Take 1 tablet (40 mg total) by mouth daily.  Marland Kitchen glucose blood (ONETOUCH VERIO) test strip 1 each by Other route 2 (two) times daily. And lancets 2/day.  . Insulin Lispro Prot & Lispro (HUMALOG MIX 50/50 KWIKPEN) (50-50) 100 UNIT/ML Kwikpen Inject 32 Units into the skin daily with breakfast.  . losartan (COZAAR) 100 MG tablet Take 1 tablet (100 mg total) by mouth daily.  . nitroGLYCERIN (NITROSTAT) 0.4 MG SL tablet Place 1 tablet (0.4 mg total) under the tongue every 5 (five) minutes as needed for chest pain (up to 3 doses).  [name] Delica Lancets 88T MISC 1 each by Does not apply route 2 (two) times daily. Use to monitor glucose levels BID; E11.9  . RELION PEN NEEDLES 31G X 6 MM MISC USE 1 PEN NEEDLE ONCE DAILY  . senna-docusate (SENOKOT-S) 8.6-50 MG per tablet Take 2 tablets by mouth 2 (two) times daily. For constipation.  . simethicone (MYLICON) 254 MG chewable tablet Chew 125 mg by mouth every 6 (six) hours as needed for flatulence.   No facility-administered encounter medications on file as of 04/06/2021.    Recent Relevant Labs: Lab Results  Component Value Date/Time   HGBA1C 6.6 (A) 02/05/2021 01:51 PM   HGBA1C 7.4 (A) 10/07/2020 02:51 PM   HGBA1C 7.7 (H) 08/28/2018 10:12 AM   HGBA1C 9.3 (H) 02/24/2018 11:09 AM   MICROALBUR 7.5 (  H) 08/17/2016 10:30 AM   MICROALBUR 9.6 (H) 08/07/2015 11:17 AM    Kidney Function Lab Results  Component Value Date/Time   CREATININE 2.29 (H) 09/10/2020 02:37 PM   CREATININE 1.88 (H) 08/15/2020 01:12 PM   GFR 41.31 (L) 04/15/2020 11:28 AM   GFRNONAA 28 (L) 09/10/2020 02:37 PM   GFRAA 32 (L) 09/10/2020 02:37 PM    . Current antihyperglycemic regimen:  Trulicity inject 0.5 ml once week, Humalog inject 32 units daily   . What recent interventions/DTPs have been made to improve glycemic control: Continue meds per [name]  . Have there been any recent hospitalizations  or ED visits since last visit with CPP? No   . Patient denies hypoglycemic symptoms, including None   . Patient denies hyperglycemic symptoms, including none   . How often are you checking your blood sugar? Patient states that he is not checking blood sugar because he does not have a meter. Patient was using a one touch viral meter and would like to know if he could get a prescription for another one if insurance pay for it  . What are your blood sugars ranging? Patient does not know at this time o Fasting:  o Before meals:  o After meals:  o Bedtime:  . During the week, how often does your blood glucose drop below 70? Patient not sure of blood sugar reading  . Are you checking your feet daily/regularly? Patient states that he sees a podiatrist   Adherence Review: Is the patient currently on a STATIN medication? Yes Is the patient currently on ACE/ARB medication? Yes Does the patient have >5 day gap between last estimated fill dates? No   Star Rating Drugs: Atorvastatin 01/21/21 90 d Losartan 12/29/20 90 d  [name] Clinical Pharmacist Assistant [phone]  Time spent:40

## 2021-04-16 NOTE — Progress Notes (Signed)
A call was made this morning to [name] about prescription for one touch meter sent to [facility] on 01/28/21. Patient stated that he went to [facility] and was told the meter was not covered by insurance and that it was considered otc item.  [place] Pharmacist Assistant [phone]  Time spent:4

## 2021-04-23 ENCOUNTER — Telehealth: Payer: Self-pay | Admitting: Endocrinology

## 2021-04-23 NOTE — Telephone Encounter (Signed)
New message   1. Which medications need to be refilled? (please list name of each medication and dose if known) Insulin Lispro Prot & Lispro (HUMALOG MIX 50/50 KWIKPEN) (50-50) 100 UNIT/ML Kwikpen  2. Which pharmacy/location (including street and city if local pharmacy) is medication to be sent to? [facility]

## 2021-04-29 ENCOUNTER — Other Ambulatory Visit: Payer: Self-pay | Admitting: Endocrinology

## 2021-04-29 MED ORDER — HUMALOG MIX 50/50 KWIKPEN (50-50) 100 UNIT/ML ~~LOC~~ SUPN: 32.0000 [IU] | PEN_INJECTOR | Freq: Every day | SUBCUTANEOUS | 1 refills | Status: DC

## 2021-04-29 NOTE — Telephone Encounter (Signed)
Refill have been sent to [facility] who handles this which is a part of [employer]

## 2021-04-30 ENCOUNTER — Telehealth: Payer: Self-pay | Admitting: Pharmacist

## 2021-04-30 NOTE — Progress Notes (Signed)
    Chronic Care Management Pharmacy Assistant   Name: [name]  MRN: [id number] DOB: 06-15-1951   Medications: Outpatient Encounter Medications as of 04/30/2021  Medication Sig   albuterol (VENTOLIN HFA) 108 (90 Base) MCG/ACT inhaler Inhale 2 puffs into the lungs every 6 (six) hours as needed.   amLODipine (NORVASC) 5 MG tablet Take 5 mg by mouth daily.   aspirin EC 81 MG tablet Take 1 tablet (81 mg total) by mouth daily. Swallow whole.   atenolol (TENORMIN) 25 MG tablet Take by mouth.   atorvastatin (LIPITOR) 40 MG tablet Take 1 tablet (40 mg total) by mouth daily.   Blood Glucose Monitoring Suppl (ONETOUCH VERIO) w/Device KIT USE METER TO CHECK GLUCOSE ONCE DAILY AS DIRECTED   Blood Pressure Monitoring (BLOOD PRESSURE CUFF) MISC 1 Units by Does not apply route daily.   calcium carbonate (OS-CAL) 1250 (500 Ca) MG chewable tablet CHEW TWO TABLETS BY MOUTH TWICE A DAY   calcium carbonate (TUMS - DOSED IN MG ELEMENTAL CALCIUM) 500 MG chewable tablet Chew 1 tablet by mouth daily as needed for indigestion or heartburn.   carvedilol (COREG) 12.5 MG tablet TAKE 1 & 1/2 (ONE & ONE-HALF) TABLETS BY MOUTH TWICE DAILY   cetirizine (ZYRTEC) 10 MG tablet Take 1 tablet by mouth daily as needed.   chlorthalidone (HYGROTON) 25 MG tablet Take 1 tablet (25 mg total) by mouth daily.   clopidogrel (PLAVIX) 75 MG tablet Take 1 tablet (75 mg total) by mouth daily. Please schedule annual appt in April for refills. Thank you   Dulaglutide (TRULICITY) 9.29 VF/4.7BU SOPN Inject 0.5 mLs (0.75 mg total) into the skin once a week.   fluticasone (FLONASE) 50 MCG/ACT nasal spray INSTILL 1 SPRAY IN EACH NOSTRIL DAILY AS NEEDED   furosemide (LASIX) 40 MG tablet Take 1 tablet (40 mg total) by mouth daily.   glucose blood (ONETOUCH VERIO) test strip 1 each by Other route 2 (two) times daily. And lancets 2/day.   Insulin Lispro Prot & Lispro (HUMALOG MIX 50/50 KWIKPEN) (50-50) 100 UNIT/ML Kwikpen Inject 32 Units  into the skin daily with breakfast.   losartan (COZAAR) 100 MG tablet Take 1 tablet (100 mg total) by mouth daily.   nitroGLYCERIN (NITROSTAT) 0.4 MG SL tablet Place 1 tablet (0.4 mg total) under the tongue every 5 (five) minutes as needed for chest pain (up to 3 doses).   OneTouch Delica Lancets 03J MISC 1 each by Does not apply route 2 (two) times daily. Use to monitor glucose levels BID; E11.9   RELION PEN NEEDLES 31G X 6 MM MISC USE 1 PEN NEEDLE ONCE DAILY   senna-docusate (SENOKOT-S) 8.6-50 MG per tablet Take 2 tablets by mouth 2 (two) times daily. For constipation.   simethicone (MYLICON) 096 MG chewable tablet Chew 125 mg by mouth every 6 (six) hours as needed for flatulence.   No facility-administered encounter medications on file as of 04/30/2021.   Pharmacist Review   A call was made to [facility] to check and see if Onetouch Verio Glucose Monitor was cover by the patient plan. The representative stated that the monitor was covered under the patient's plan   [place] Pharmacist Assistant [phone]   Time spent:61

## 2021-05-04 NOTE — Chronic Care Management (AMB) (Signed)
Chronic Care Management Pharmacy Assistant   Name: [name]  MRN: [id number] DOB: August 29, 1951   Reason for Encounter: Disease State   Conditions to be addressed/monitored: DMII   Recent office visits:  None ID  Recent consult visits:  None ID  Hospital visits:  None in previous 6 months  Medications: Outpatient Encounter Medications as of 04/30/2021  Medication Sig   albuterol (VENTOLIN HFA) 108 (90 Base) MCG/ACT inhaler Inhale 2 puffs into the lungs every 6 (six) hours as needed.   amLODipine (NORVASC) 5 MG tablet Take 5 mg by mouth daily.   aspirin EC 81 MG tablet Take 1 tablet (81 mg total) by mouth daily. Swallow whole.   atenolol (TENORMIN) 25 MG tablet Take by mouth.   atorvastatin (LIPITOR) 40 MG tablet Take 1 tablet (40 mg total) by mouth daily.   Blood Glucose Monitoring Suppl (ONETOUCH VERIO) w/Device KIT USE METER TO CHECK GLUCOSE ONCE DAILY AS DIRECTED   Blood Pressure Monitoring (BLOOD PRESSURE CUFF) MISC 1 Units by Does not apply route daily.   calcium carbonate (OS-CAL) 1250 (500 Ca) MG chewable tablet CHEW TWO TABLETS BY MOUTH TWICE A DAY   calcium carbonate (TUMS - DOSED IN MG ELEMENTAL CALCIUM) 500 MG chewable tablet Chew 1 tablet by mouth daily as needed for indigestion or heartburn.   carvedilol (COREG) 12.5 MG tablet TAKE 1 & 1/2 (ONE & ONE-HALF) TABLETS BY MOUTH TWICE DAILY   cetirizine (ZYRTEC) 10 MG tablet Take 1 tablet by mouth daily as needed.   chlorthalidone (HYGROTON) 25 MG tablet Take 1 tablet (25 mg total) by mouth daily.   clopidogrel (PLAVIX) 75 MG tablet Take 1 tablet (75 mg total) by mouth daily. Please schedule annual appt in April for refills. Thank you   Dulaglutide (TRULICITY) 9.74 BU/3.8GT SOPN Inject 0.5 mLs (0.75 mg total) into the skin once a week.   fluticasone (FLONASE) 50 MCG/ACT nasal spray INSTILL 1 SPRAY IN EACH NOSTRIL DAILY AS NEEDED   furosemide (LASIX) 40 MG tablet Take 1 tablet (40 mg total) by mouth daily.    glucose blood (ONETOUCH VERIO) test strip 1 each by Other route 2 (two) times daily. And lancets 2/day.   Insulin Lispro Prot & Lispro (HUMALOG MIX 50/50 KWIKPEN) (50-50) 100 UNIT/ML Kwikpen Inject 32 Units into the skin daily with breakfast.   losartan (COZAAR) 100 MG tablet Take 1 tablet (100 mg total) by mouth daily.   nitroGLYCERIN (NITROSTAT) 0.4 MG SL tablet Place 1 tablet (0.4 mg total) under the tongue every 5 (five) minutes as needed for chest pain (up to 3 doses).   OneTouch Delica Lancets 36I MISC 1 each by Does not apply route 2 (two) times daily. Use to monitor glucose levels BID; E11.9   RELION PEN NEEDLES 31G X 6 MM MISC USE 1 PEN NEEDLE ONCE DAILY   senna-docusate (SENOKOT-S) 8.6-50 MG per tablet Take 2 tablets by mouth 2 (two) times daily. For constipation.   simethicone (MYLICON) 680 MG chewable tablet Chew 125 mg by mouth every 6 (six) hours as needed for flatulence.   No facility-administered encounter medications on file as of 04/30/2021.    Pharmacist Review Recent Relevant Labs: Lab Results  Component Value Date/Time   HGBA1C 6.6 (A) 02/05/2021 01:51 PM   HGBA1C 7.4 (A) 10/07/2020 02:51 PM   HGBA1C 7.7 (H) 08/28/2018 10:12 AM   HGBA1C 9.3 (H) 02/24/2018 11:09 AM   MICROALBUR 7.5 (H) 08/17/2016 10:30 AM   MICROALBUR 9.6 (H) 08/07/2015 11:17 AM  Kidney Function Lab Results  Component Value Date/Time   CREATININE 2.29 (H) 09/10/2020 02:37 PM   CREATININE 1.88 (H) 08/15/2020 01:12 PM   GFR 41.31 (L) 04/15/2020 11:28 AM   GFRNONAA 28 (L) 09/10/2020 02:37 PM   GFRAA 32 (L) 09/10/2020 02:37 PM    Current antihyperglycemic regimen:  Trulicity 2.50 IB/7.0 ml inject 0.5 ml into skin once a week  What recent interventions/DTPs have been made to improve glycemic control:  Continue current meds Have there been any recent hospitalizations or ED visits since last visit with CPP? No Patient denies hypoglycemic symptoms, including None Patient denies hyperglycemic  symptoms, including none How often are you checking your blood sugar? 3-4 times daily What are your blood sugars ranging? 130-189 Fasting: None Before meals: 189 After meals: 130 Bedtime: 134 During the week, how often does your blood glucose drop below 70? Never Are you checking your feet daily/regularly? Patient states he is not having any issues with his feet  Adherence Review: Is the patient currently on a STATIN medication? Yes Is the patient currently on ACE/ARB medication? Yes Does the patient have >5 day gap between last estimated fill dates? Yes    Star Rating Drugs: Atorvastatin 01/21/21 90 ds Losartan 12/29/20 90 ds (patient state that he has some on hand, but will call for refill)  [place] Pharmacist Assistant [phone]   Time spent:21

## 2021-05-07 ENCOUNTER — Telehealth: Payer: Self-pay | Admitting: Endocrinology

## 2021-05-07 NOTE — Telephone Encounter (Signed)
Pt also states that he only has one more week of insulin left. If any questions patient voiced to call him.

## 2021-05-07 NOTE — Telephone Encounter (Signed)
Pt calling in stating that [facility] has received the prescription for  Insulin Lispro Prot & Lispro (HUMALOG MIX 50/50 KWIKPEN) (50-50) 100 UNIT/ML Kwikpen  But the information on the paper was not filled out correctly. Pt voiced another prescription needs to be sent in

## 2021-05-11 ENCOUNTER — Ambulatory Visit (INDEPENDENT_AMBULATORY_CARE_PROVIDER_SITE_OTHER): Payer: Medicare HMO | Admitting: Endocrinology

## 2021-05-11 ENCOUNTER — Telehealth: Payer: Self-pay | Admitting: Endocrinology

## 2021-05-11 ENCOUNTER — Other Ambulatory Visit: Payer: Self-pay

## 2021-05-11 VITALS — BP 128/80 | HR 83 | Ht 71.0 in | Wt 217.4 lb

## 2021-05-11 DIAGNOSIS — Z794 Long term (current) use of insulin: Secondary | ICD-10-CM | POA: Diagnosis not present

## 2021-05-11 DIAGNOSIS — E119 Type 2 diabetes mellitus without complications: Secondary | ICD-10-CM

## 2021-05-11 LAB — POCT GLYCOSYLATED HEMOGLOBIN (HGB A1C): Hemoglobin A1C: 6.7 % — AB (ref 4.0–5.6)

## 2021-05-11 NOTE — Patient Instructions (Addendum)
Please continue the same Trulicity, and reduce the insulin to 32 units with breakfast. On this type of insulin schedule, you should eat meals on a regular schedule (especially breakfast and lunch).  If a meal is missed or significantly delayed, your blood sugar could go low.   If you run out of insulin, you should buy a bottle of 70/30 insulin at [facility].  This is not the same as what you are on, but it is close.  You should ask a friend to draw up 1 week at a time. Please come back for a follow-up appointment in 3 months.   check your blood sugar twice a day.  vary the time of day when you check, between before the 3 meals, and at bedtime.  also check if you have symptoms of your blood sugar being too high or too low.  please keep a record of the readings and bring it to your next appointment here (or you can bring the meter itself).  You can write it on any piece of paper.  please call us sooner if your blood sugar goes below 70, or if you have a lot of readings over 200.

## 2021-05-11 NOTE — Telephone Encounter (Signed)
Pt is asking about the progress on insulin refill from pt assist.  Please advise pt.  TY

## 2021-05-11 NOTE — Progress Notes (Signed)
Subjective:    Patient ID: [name], male    DOB: 1951-03-07, 70 y.o.   MRN: [id number]  HPI Pt returns for f/u of diabetes mellitus:  DM type: Insulin-requiring type 2 Dx'ed: 3846 Complications: PN, CAD, CRI, DR, and TIA.   Therapy: insulin since 6599, and Trulicity.   DKA: never.   Severe hypoglycemia: last episode was early 2020.   Pancreatitis: never.  SDOH: he gets meds from mfgr--pt assistance; pt lives alone.  Dtr visits [facility].  Other: He says he cannot use syringe and vial, due to tremor; he changed lantus to NPH, then 70/30, then 50/50, due to pattern on cbg's; he declines multiple daily injections; Trulicity dosage is limited by cough and sob; fructosamine has confirmed A1c.   Interval history: He says he never misses the meds.  He says insulin is 35 its qam.  pt states he feels well in general.  No recent steroids.  no cbg record, but states cbg's vary from 79-180.  He seldom has hypoglycemia, and these episodes are mild.   Past Medical History:  Diagnosis Date   ALLERGIC RHINITIS    CAD, NATIVE VESSEL    BMS to [place] 2001, DES to BMS 2005   Chronic back pain    herniated disc   Chronic combined systolic and diastolic heart failure ([place]) 06/29/2016   Constipation    takes Carafate four times day   DIAB W/UNSPEC COMP TYPE II/UNSPEC TYPE UNCNTRL    ERECTILE DYSFUNCTION    GERD    HYPERLIPIDEMIA-MIXED    HYPERTENSION, BENIGN    LBBB (left bundle branch block) 02/02/2016   MORTON'S NEUROMA, RIGHT    Peripheral neuropathy    Seasonal allergies    takes Allegra and Benadryl daily prn;uses Flonase daily   SHOULDER PAIN, RIGHT    Snoring 09/09/2020   Stroke, 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   L HP + hemiparesis, s/p CIR    TIA on medication 02/2012   Unstable angina ([place]) 07/09/2020    Past Surgical History:  Procedure Laterality Date   ANGIOPLASTY     COLONOSCOPY     CORONARY ANGIOPLASTY  2005   2 stents   CORONARY STENT INTERVENTION N/A 07/10/2020   Procedure:  CORONARY STENT INTERVENTION;  Surgeon: Martinique, Peter M, MD;  Location: [facility];  Service: Cardiovascular;  Laterality: N/A;   DENTAL SURGERY     LARYNGOPLASTY  08/07/2012   Procedure: LARYNGOPLASTY;  Surgeon: Izora Gala, MD;  Location: [facility];  Service: ENT;  Laterality: Left;  Left Vocal Cord Medialyzation   LEFT HEART CATH AND CORONARY ANGIOGRAPHY N/A 07/10/2020   Procedure: LEFT HEART CATH AND CORONARY ANGIOGRAPHY;  Surgeon: Martinique, Peter M, MD;  Location: [facility];  Service: Cardiovascular;  Laterality: N/A;   left knee surgury     x 2    stent  2001, 2004   coronary stents    Social History   Socioeconomic History   Marital status: Legally Separated    Spouse name: Not on file   Number of children: 3   Years of education: college   Highest education level: Not on file  Occupational History   Occupation: disabled    Employer: [employer]  Tobacco Use   Smoking status: Former    Packs/day: 2.50    Years: 9.00    Pack years: 22.50    Types: Cigarettes    Quit date: 02/24/1983    Years since quitting: 38.2   Smokeless tobacco: Never  Vaping  Use   Vaping Use: Never used  Substance and Sexual Activity   Alcohol use: No    Alcohol/week: 0.0 standard drinks   Drug use: No   Sexual activity: Never  Other Topics Concern   Not on file  Social History Narrative      Social Determinants of Health   Financial Resource Strain: Low Risk    Difficulty of Paying Living Expenses: Not hard at all  Food Insecurity: No Food Insecurity   Worried About Charity fundraiser in the Last Year: Never true   [place] in the Last Year: Never true  Transportation Needs: No Transportation Needs   Lack of Transportation (Medical): No   Lack of Transportation (Non-Medical): No  Physical Activity: Inactive   Days of Exercise per Week: 0 days   Minutes of Exercise per Session: 0 min  Stress: No Stress Concern Present   Feeling of Stress : Not at all  Social  Connections: Moderately Integrated   Frequency of Communication with Friends and Family: More than three times a week   Frequency of Social Gatherings with Friends and Family: More than three times a week   Attends Religious Services: 1 to 4 times per year   Active Member of [employer] or Organizations: No   Attends Music therapist: 1 to 4 times per year   Marital Status: Never married  Human resources officer Violence: Not on file    Current Outpatient Medications on File Prior to Visit  Medication Sig Dispense Refill   albuterol (VENTOLIN HFA) 108 (90 Base) MCG/ACT inhaler Inhale 2 puffs into the lungs every 6 (six) hours as needed.     amLODipine (NORVASC) 5 MG tablet Take 5 mg by mouth daily.     aspirin EC 81 MG tablet Take 1 tablet (81 mg total) by mouth daily. Swallow whole. 30 tablet 11   atenolol (TENORMIN) 25 MG tablet Take by mouth.     atorvastatin (LIPITOR) 40 MG tablet Take 1 tablet (40 mg total) by mouth daily. 90 tablet 3   Blood Glucose Monitoring Suppl (ONETOUCH VERIO) w/Device KIT USE METER TO CHECK GLUCOSE ONCE DAILY AS DIRECTED 1 kit 0   Blood Pressure Monitoring (BLOOD PRESSURE CUFF) MISC 1 Units by Does not apply route daily. 1 each 0   calcium carbonate (OS-CAL) 1250 (500 Ca) MG chewable tablet CHEW TWO TABLETS BY MOUTH TWICE A DAY     calcium carbonate (TUMS - DOSED IN MG ELEMENTAL CALCIUM) 500 MG chewable tablet Chew 1 tablet by mouth daily as needed for indigestion or heartburn.     carvedilol (COREG) 12.5 MG tablet TAKE 1 & 1/2 (ONE & ONE-HALF) TABLETS BY MOUTH TWICE DAILY 270 tablet 2   cetirizine (ZYRTEC) 10 MG tablet Take 1 tablet by mouth daily as needed.     chlorthalidone (HYGROTON) 25 MG tablet Take 1 tablet (25 mg total) by mouth daily. 90 tablet 3   clopidogrel (PLAVIX) 75 MG tablet Take 1 tablet (75 mg total) by mouth daily. Please schedule annual appt in April for refills. Thank you 90 tablet 3   Dulaglutide (TRULICITY) 8.84 ZY/6.0YT SOPN Inject 0.5  mLs (0.75 mg total) into the skin once a week. 6 mL 3   fluticasone (FLONASE) 50 MCG/ACT nasal spray INSTILL 1 SPRAY IN EACH NOSTRIL DAILY AS NEEDED     furosemide (LASIX) 40 MG tablet Take 1 tablet (40 mg total) by mouth daily. 90 tablet 2   glucose blood (ONETOUCH VERIO)  test strip 1 each by Other route 2 (two) times daily. And lancets 2/day. 200 each 3   Insulin Lispro Prot & Lispro (HUMALOG MIX 50/50 KWIKPEN) (50-50) 100 UNIT/ML Kwikpen Inject 32 Units into the skin daily with breakfast. 45 mL 1   losartan (COZAAR) 100 MG tablet Take 1 tablet (100 mg total) by mouth daily. 90 tablet 3   nitroGLYCERIN (NITROSTAT) 0.4 MG SL tablet Place 1 tablet (0.4 mg total) under the tongue every 5 (five) minutes as needed for chest pain (up to 3 doses). 25 tablet 3   OneTouch Delica Lancets 26J MISC 1 each by Does not apply route 2 (two) times daily. Use to monitor glucose levels BID; E11.9 100 each 3   RELION PEN NEEDLES 31G X 6 MM MISC USE 1 PEN NEEDLE ONCE DAILY 50 each 0   senna-docusate (SENOKOT-S) 8.6-50 MG per tablet Take 2 tablets by mouth 2 (two) times daily. For constipation.     simethicone (MYLICON) 335 MG chewable tablet Chew 125 mg by mouth every 6 (six) hours as needed for flatulence.     No current facility-administered medications on file prior to visit.    Allergies  Allergen Reactions   Penicillins Anaphylaxis   Pneumococcal Vaccines Anaphylaxis   Shellfish Allergy Anaphylaxis   Influenza Vaccines    Lisinopril Cough   Topiramate Other (See Comments)    Chest spasms and numbness   Amlodipine Other (See Comments)    LE edema   Levitra [Vardenafil] Other (See Comments)    headaches   Metformin Nausea And Vomiting   Tadalafil Other (See Comments)    dizziness    Family History  Problem Relation Age of Onset   Cancer Mother     BP 128/80   Pulse 83   Ht _0  (1.803 m)   Wt 217 lb 6.4 oz (98.6 kg)   SpO2 99%   BMI 30.32 kg/m    Review of Systems He denies LOC     Objective:   Physical Exam GENERAL: no distress Pulses: dorsalis pedis intact bilat.   MSK: no deformity of the feet CV: no leg edema Skin:  no ulcer on the feet.  normal color and temp on the feet.  Neuro: sensation is intact to touch on the feet, but decreased from normal.   A1c=6.7%     Assessment & Plan:  Insulin-requiring type 2 DM: overcontrolled.    Patient Instructions  Please continue the same Trulicity, and reduce the insulin to 32 units with breakfast. On this type of insulin schedule, you should eat meals on a regular schedule (especially breakfast and lunch).  If a meal is missed or significantly delayed, your blood sugar could go low.   If you run out of insulin, you should buy a bottle of 70/30 insulin at [facility].  This is not the same as what you are on, but it is close.  You should ask a friend to draw up 1 week at a time. Please come back for a follow-up appointment in 3 months.   check your blood sugar twice a day.  vary the time of day when you check, between before the 3 meals, and at bedtime.  also check if you have symptoms of your blood sugar being too high or too low.  please keep a record of the readings and bring it to your next appointment here (or you can bring the meter itself).  You can write it on any piece of paper.  please  call us sooner if your blood sugar goes below 70, or if you have a lot of readings over 200.

## 2021-05-13 NOTE — Telephone Encounter (Signed)
I have spoken with the pt and informed him that I have faxed over the necessary documents for Humalog 50/50 and also contacted [facility] to let them know.

## 2021-05-15 ENCOUNTER — Encounter (HOSPITAL_BASED_OUTPATIENT_CLINIC_OR_DEPARTMENT_OTHER): Payer: Self-pay

## 2021-05-15 NOTE — Telephone Encounter (Signed)
Pt has voiced that, [facility] has not received any paperwork ... pt would like a call back.

## 2021-05-19 NOTE — Telephone Encounter (Signed)
Patient states that [facility] received an RX for Humalog 50/50 in vials.  Patients states he gets [employer].  Patient requesting RX be updated and resent to [employer] - is almost out of pens/medicine

## 2021-05-21 ENCOUNTER — Encounter (INDEPENDENT_AMBULATORY_CARE_PROVIDER_SITE_OTHER): Payer: Self-pay

## 2021-05-22 ENCOUNTER — Telehealth: Payer: Medicare HMO

## 2021-05-26 NOTE — Telephone Encounter (Signed)
Pt called to report that the patient assistance called the pt to let him know the last paperwork that was sent in on June 30th was filed out incorrect. They ask that we please fill out the strengths box (100 u/mL) and the instructions box (how many units).  Pt requests a call from the nurse or Dr to report they have done this at [phone]. Pt states he is completely out of medication.

## 2021-05-28 ENCOUNTER — Telehealth: Payer: Self-pay

## 2021-05-28 NOTE — Telephone Encounter (Signed)
Pt is enrolled in  [facility] for 12 months

## 2021-06-01 NOTE — Telephone Encounter (Signed)
Patient called, states that [facility] advised if the RX for his medication can be faxed to [phone] and then they are called when faxed they can send medicine out to patient today.  Patients call back number is [phone]

## 2021-06-02 NOTE — Telephone Encounter (Signed)
Pt called again to get his Humalog 50/50 pen prescription with Dr signature sent to fax [phone].

## 2021-06-03 ENCOUNTER — Other Ambulatory Visit: Payer: Self-pay

## 2021-06-03 DIAGNOSIS — E119 Type 2 diabetes mellitus without complications: Secondary | ICD-10-CM

## 2021-06-03 MED ORDER — HUMALOG MIX 50/50 KWIKPEN (50-50) 100 UNIT/ML ~~LOC~~ SUPN: 32.0000 [IU] | PEN_INJECTOR | Freq: Every day | SUBCUTANEOUS | 1 refills | Status: DC

## 2021-06-03 NOTE — Telephone Encounter (Signed)
Rx faxed

## 2021-06-04 ENCOUNTER — Telehealth: Payer: Self-pay | Admitting: Pharmacist

## 2021-06-04 ENCOUNTER — Other Ambulatory Visit: Payer: Self-pay

## 2021-06-04 ENCOUNTER — Ambulatory Visit (INDEPENDENT_AMBULATORY_CARE_PROVIDER_SITE_OTHER): Payer: Medicare HMO | Admitting: Pharmacist

## 2021-06-04 DIAGNOSIS — Z9861 Coronary angioplasty status: Secondary | ICD-10-CM

## 2021-06-04 DIAGNOSIS — I2510 Atherosclerotic heart disease of native coronary artery without angina pectoris: Secondary | ICD-10-CM

## 2021-06-04 DIAGNOSIS — I10 Essential (primary) hypertension: Secondary | ICD-10-CM

## 2021-06-04 DIAGNOSIS — N1832 Chronic kidney disease, stage 3b: Secondary | ICD-10-CM

## 2021-06-04 DIAGNOSIS — E119 Type 2 diabetes mellitus without complications: Secondary | ICD-10-CM | POA: Diagnosis not present

## 2021-06-04 DIAGNOSIS — I5042 Chronic combined systolic (congestive) and diastolic (congestive) heart failure: Secondary | ICD-10-CM | POA: Diagnosis not present

## 2021-06-04 DIAGNOSIS — E1122 Type 2 diabetes mellitus with diabetic chronic kidney disease: Secondary | ICD-10-CM

## 2021-06-04 DIAGNOSIS — Z8673 Personal history of transient ischemic attack (TIA), and cerebral infarction without residual deficits: Secondary | ICD-10-CM

## 2021-06-04 DIAGNOSIS — Z794 Long term (current) use of insulin: Secondary | ICD-10-CM

## 2021-06-04 NOTE — Progress Notes (Signed)
    Chronic Care Management Pharmacy Assistant   Name: Joshuah Minella  MRN: [id number] DOB: 05-Sep-1951  Reason for Encounter: Follow -Up Refill   [place] and spoke with Pharmacists Dorothea Ogle,  to check the status of a refill for Humalog. According to patient's chart it was faxed yesterday and patient stated he has been out since Sunday, per Dorothea Ogle a fax had not been received. Please re-fax prescription.  Orinda Kenner, [facility] Clinical Pharmacists Assistant [phone]  Time Spent: [phone]

## 2021-06-04 NOTE — Progress Notes (Signed)
Chronic Care Management Pharmacy Note  06/05/2021 Name:  Caydence Enck MRN:  [id number] DOB:  [birth year]  Summary: -Pt reports he is out of Humalog 50/50 insulin since Sunday - endocrine office is working on refilling medication to [place] is taking both atenolol and carvedilol. Carvedilol is prescribed by Dr Oval Linsey, it is not known who prescribes atenolol (patient does not know and not evident in chart/Surescripts) -Pt has not received CPAP supplies yet due to backlog at supplier (ordered in Jan 2022)  Recommendations/Changes made from today's visit: -Provided sample of Toujeo insulin (only one in stock) and advised pt to take 22 units daily until Humalog arrives from [facility]. Resume 32 units of Humalog 50/50 when available. -Stop atenolol due to duplicate therapy with carvedilol and risk for bradycardia. Monitor BP 3 x a week at home.   Subjective: Elyjah Hazan is an 70 y.o. year old male who is a primary patient of Burns, Claudina Lick, MD.  The CCM team was consulted for assistance with disease management and care coordination needs.    Engaged with patient face to face for follow up visit in response to provider referral for pharmacy case management and/or care coordination services.   Consent to Services:  The patient was given information about Chronic Care Management services, agreed to services, and gave verbal consent prior to initiation of services.  Please see initial visit note for detailed documentation.   Patient Care Team: Binnie Rail, MD as PCP - General (Internal Medicine) Skeet Latch, MD as PCP - Cardiology (Cardiology) Nahser, Wonda Cheng, MD (Cardiology) Renato Shin, MD (Endocrinology) Izora Gala, MD (Otolaryngology) Garvin Fila, MD (Neurology) Almedia Balls, MD (Orthopedic Surgery) Sable Feil, MD (Gastroenterology) Wynona Canes, MD as Consulting Physician (Neurology) Charlton Haws, [facility] as Pharmacist  (Pharmacist)   Patient comes alone today. He walks with a cane. He did not bring medications but believes he has a good idea of what he takes.  Recent office visits: 10/07/20 Dr Quay Burow OV: chronic f/u, no med changes. F/U 1 year.  Recent consult visits: 05/11/21 Dr Loanne Drilling (endocrine): reduce insulin to 32 units  02/03/21 Dr Oval Linsey (cardiology): f/u CAD, OSA; awaiting CPAP. Cont indefinite DAPT. Consider Wilder Glade.  02/02/21 Dr Joylene Grapes (nephrology): f/u CKD. Consider SGLT2-I for CKD, CAD, DM.  Hospital visits: None in previous 6 months   Objective:  Lab Results  Component Value Date   CREATININE 2.29 (H) 09/10/2020   BUN 41 (H) 09/10/2020   GFR 41.31 (L) 04/15/2020   GFRNONAA 28 (L) 09/10/2020   GFRAA 32 (L) 09/10/2020   NA 142 09/10/2020   K 3.8 09/10/2020   CALCIUM 9.6 09/10/2020   CO2 29 09/10/2020   GLUCOSE 279 (H) 09/10/2020    Lab Results  Component Value Date/Time   HGBA1C 6.7 (A) 05/11/2021 01:55 PM   HGBA1C 6.6 (A) 02/05/2021 01:51 PM   HGBA1C 7.7 (H) 08/28/2018 10:12 AM   HGBA1C 9.3 (H) 02/24/2018 11:09 AM   FRUCTOSAMINE 371 (H) 02/05/2021 02:08 PM   FRUCTOSAMINE 435 (H) 04/15/2017 09:34 AM   GFR 41.31 (L) 04/15/2020 11:28 AM   GFR 48.79 (L) 10/17/2019 01:29 PM   MICROALBUR 7.5 (H) 08/17/2016 10:30 AM   MICROALBUR 9.6 (H) 08/07/2015 11:17 AM    Last diabetic Eye exam:  Lab Results  Component Value Date/Time   HMDIABEYEEXA Retinopathy (A) 09/23/2017 12:00 AM    Last diabetic Foot exam:  Lab Results  Component Value Date/Time   HMDIABFOOTEX done 11/30/2019 12:00 AM  Lab Results  Component Value Date   CHOL 112 04/15/2020   HDL 44.60 04/15/2020   LDLCALC 40 04/15/2020   LDLDIRECT 63.0 01/12/2016   TRIG 136.0 04/15/2020   CHOLHDL 3 04/15/2020    Hepatic Function Latest Ref Rng & Units 07/07/2020 04/15/2020 10/17/2019  Total Protein 6.5 - 8.1 g/dL 8.2(H) 7.9 8.0  Albumin 3.5 - 5.0 g/dL 4.3 4.3 3.9  AST 15 - 41 U/L _0 ALT 0 - 44 U/L _1 Alk Phosphatase 38 - 126 U/L 58 74 78  Total Bilirubin 0.3 - 1.2 mg/dL 1.0 0.9 1.0  Bilirubin, Direct 0.0 - 0.3 mg/dL - - -    Lab Results  Component Value Date/Time   TSH 2.08 10/17/2019 01:29 PM   TSH 1.55 02/24/2018 11:09 AM    CBC Latest Ref Rng & Units 07/10/2020 07/07/2020 05/12/2020  WBC 4.0 - 10.5 K/uL 5.1 6.8 6.8  Hemoglobin 13.0 - 17.0 g/dL 9.7(L) 11.3(L) 11.4(L)  Hematocrit 39.0 - 52.0 % 29.3(L) 34.5(L) 34.9(L)  Platelets 150 - 400 K/uL 190 218 228    No results found for: VD25OH  Clinical ASCVD: Yes  The ASCVD Risk score Mikey Bussing DC Jr., et al., 2013) failed to calculate for the following reasons:   The patient has a prior MI or stroke diagnosis    Depression screen [facility] 2/9 01/28/2021 10/17/2019 02/24/2018  Decreased Interest 0 0 0  Down, Depressed, Hopeless 0 0 1  PHQ - 2 Score 0 0 1  Altered sleeping - - 0  Tired, decreased energy - - 1  Change in appetite - - 0  Feeling bad or failure about yourself  - - 0  Trouble concentrating - - 0  Moving slowly or fidgety/restless - - 0  Suicidal thoughts - - 0  PHQ-9 Score - - 2  Difficult doing work/chores - - Not difficult at all  Some recent data might be hidden     Social History   Tobacco Use  Smoking Status Former   Packs/day: 2.50   Years: 9.00   Pack years: 22.50   Types: Cigarettes   Quit date: 02/24/1983   Years since quitting: 38.3  Smokeless Tobacco Never   BP Readings from Last 3 Encounters:  05/11/21 128/80  02/05/21 130/70  02/03/21 130/84   Pulse Readings from Last 3 Encounters:  05/11/21 83  02/05/21 90  02/03/21 72   Wt Readings from Last 3 Encounters:  05/11/21 217 lb 6.4 oz (98.6 kg)  02/05/21 219 lb 3.2 oz (99.4 kg)  02/03/21 217 lb 6.4 oz (98.6 kg)   BMI Readings from Last 3 Encounters:  05/11/21 30.32 kg/m  02/05/21 30.57 kg/m  02/03/21 30.32 kg/m    Assessment/Interventions: Review of patient past medical history, allergies, medications, health status, including  review of consultants reports, laboratory and other test data, was performed as part of comprehensive evaluation and provision of chronic care management services.   SDOH:  (Social Determinants of Health) assessments and interventions performed: Yes  SDOH Screenings   Alcohol Screen: Low Risk    Last Alcohol Screening Score (AUDIT): 0  Depression (PHQ2-9): Low Risk    PHQ-2 Score: 0  Financial Resource Strain: Low Risk    Difficulty of Paying Living Expenses: Not hard at all  Food Insecurity: No Food Insecurity   Worried About Charity fundraiser in the Last Year: Never true   Ran Out of Food in the Last Year: Never true  Housing: Low  Risk    Last Housing Risk Score: 0  Physical Activity: Inactive   Days of Exercise per Week: 0 days   Minutes of Exercise per Session: 0 min  Social Connections: Moderately Integrated   Frequency of Communication with Friends and Family: More than three times a week   Frequency of Social Gatherings with Friends and Family: More than three times a week   Attends Religious Services: 1 to 4 times per year   Active Member of [employer] or Organizations: No   Attends Music therapist: 1 to 4 times per year   Marital Status: Never married  Stress: No Stress Concern Present   Feeling of Stress : Not at all  Tobacco Use: Medium Risk   Smoking Tobacco Use: Former   Smokeless Tobacco Use: Never  Transportation Needs: No Data processing manager (Medical): No   Lack of Transportation (Non-Medical): No    CCM Care Plan  Allergies  Allergen Reactions   Penicillins Anaphylaxis   Pneumococcal Vaccines Anaphylaxis   Shellfish Allergy Anaphylaxis   Influenza Vaccines    Lisinopril Cough   Topiramate Other (See Comments)    Chest spasms and numbness   Amlodipine Other (See Comments)    LE edema   Levitra [Vardenafil] Other (See Comments)    headaches   Metformin Nausea And Vomiting   Tadalafil Other (See Comments)     dizziness    Medications Reviewed Today     Reviewed by Charlton Haws, [facility] (Pharmacist) on 06/04/21 at 1222  Med List Status: <None>   Medication Order Taking? Sig Documenting Provider Last Dose Status Informant  amLODipine (NORVASC) 5 MG tablet [id number] Yes Take 5 mg by mouth daily. [provider] Taking Active   aspirin EC 81 MG tablet [id number] Yes Take 1 tablet (81 mg total) by mouth daily. Swallow whole. Charlie Pitter, PA-C Taking Active Self  atorvastatin (LIPITOR) 40 MG tablet [id number] Yes Take 1 tablet (40 mg total) by mouth daily. Skeet Latch, MD Taking Active   Blood Glucose Monitoring [facility] VERIO) w/Device Drucie Opitz [id number] Yes USE METER TO CHECK GLUCOSE ONCE DAILY AS DIRECTED Binnie Rail, MD Taking Active   Blood Pressure Monitoring (BLOOD PRESSURE CUFF) MISC [id number] Yes 1 Units by Does not apply route daily. Skeet Latch, MD Taking Active   calcium carbonate (TUMS - DOSED IN MG ELEMENTAL CALCIUM) 500 MG chewable tablet [id number] Yes Chew 1 tablet by mouth daily as needed for indigestion or heartburn. [provider] Taking Active Self  carvedilol (COREG) 12.5 MG tablet [id number] Yes TAKE 1 & 1/2 (ONE & ONE-HALF) TABLETS BY MOUTH TWICE DAILY Skeet Latch, MD Taking Active   cetirizine (ZYRTEC) 10 MG tablet [id number] Yes Take 1 tablet by mouth daily as needed. [provider] Taking Active   chlorthalidone (HYGROTON) 25 MG tablet [id number] Yes Take 1 tablet (25 mg total) by mouth daily. Skeet Latch, MD Taking Active   clopidogrel (PLAVIX) 75 MG tablet [id number] Yes Take 1 tablet (75 mg total) by mouth daily. Please schedule annual appt in April for refills. Thank you Skeet Latch, MD Taking Active   Dulaglutide (TRULICITY) 1.11 BZ/2.0EY SOPN [id number] Yes Inject 0.5 mLs (0.75 mg total) into the skin once a week. Renato Shin, MD Taking Active Self  furosemide (LASIX) 40 MG tablet [id number]  Take 1 tablet (40 mg  total) by mouth daily. Skeet Latch, MD  Expired 04/21/21 2359   glucose blood (ONETOUCH VERIO) test strip [id number]  Yes 1 each by Other route 2 (two) times daily. And lancets 2/day. Binnie Rail, MD Taking Active   Insulin Lispro Prot & Lispro (HUMALOG MIX 50/50 KWIKPEN) (50-50) 100 UNIT/ML Claiborne Rigg [id number] Yes Inject 32 Units into the skin daily with breakfast. Renato Shin, MD Taking Active   losartan (COZAAR) 100 MG tablet [id number] Yes Take 1 tablet (100 mg total) by mouth daily. Skeet Latch, MD Taking Active   nitroGLYCERIN (NITROSTAT) 0.4 MG SL tablet [id number] Yes Place 1 tablet (0.4 mg total) under the tongue every 5 (five) minutes as needed for chest pain (up to 3 doses). Charlie Pitter, PA-C Taking Active Self  OneTouch Delica Lancets 26O MISC [id number] Yes 1 each by Does not apply route 2 (two) times daily. Use to monitor glucose levels BID; E11.9 Burns, Claudina Lick, MD Taking Active   senna-docusate (SENOKOT-S) 8.6-50 MG per tablet [id number] Yes Take 2 tablets by mouth 2 (two) times daily. For constipation. Bary Leriche, [place] Taking Active Self            Patient Active Problem List   Diagnosis Date Noted   Coronary artery disease 03/25/2021   Hoarse 03/25/2021   Major depressive disorder, recurrent episode, moderate ([place]) 03/25/2021   Major depressive disorder, single episode 03/25/2021   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   Observation and evaluation for other specified suspected conditions 03/25/2021   Vocal cord paralysis 03/25/2021   Diabetic peripheral neuropathy ([place]) 12/25/2020   Orthopnea 12/16/2020   Snoring 09/09/2020   Unstable angina ([place]) 07/09/2020   Sciatica of left side 07/11/2019   Cough 07/11/2019   Edema leg 10/26/2018   History of stroke 10/26/2018   DOE (dyspnea on exertion) 10/26/2018   Left hip pain 07/14/2018   Acute left ankle pain 07/14/2018   Chest pain 07/14/2018   Nonintractable headache 02/24/2018   CRI (chronic renal  insufficiency), stage 3 (moderate) 02/22/2017   Anemia 02/16/2017   Chronic combined systolic and diastolic heart failure ([place]) 06/29/2016   LBBB (left bundle branch block) 02/02/2016   Nonallopathic lesion of lumbosacral region 12/11/2014   Nonallopathic lesion of sacral region 12/11/2014   Nonallopathic lesion of thoracic region 12/11/2014   Arthritis of left hip 11/20/2014   Ischial bursitis of left side 10/28/2014   Hamstring tightness of left lower extremity 09/16/2014   Piriformis syndrome of left side 09/16/2014   Dysphonia 06/22/2013   Hemiparesis affecting left side as late effect of stroke ([place]) 10/10/2012   Dysphagia following cerebrovascular accident 08/14/2012   Chronic back pain    Peripheral neuropathy ([place])    TIA (transient ischemic attack) 04/08/2012   Stroke ([place]) 02/21/2012   MORTON'S NEUROMA, RIGHT 05/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   Diabetes ([place]) 04/13/2010   ALLERGIC RHINITIS 04/13/2010   GERD 04/13/2010   ERECTILE DYSFUNCTION 03/12/2009   Essential hypertension 03/12/2009   CAD S/P percutaneous coronary angioplasty 03/12/2009   Hyperlipidemia 03/07/2009    Immunization History  Administered Date(s) Administered   PFIZER(Purple Top)SARS-COV-2 Vaccination 03/01/2020, 03/26/2020, 10/14/2020   Td 11/22/2004   Tdap 11/30/2011   Tetanus 07/13/2012    Conditions to be addressed/monitored:  Hypertension, Hyperlipidemia, Diabetes, Heart Failure, Coronary Artery Disease, and Chronic Kidney Disease  Care Plan : [place]  Updates made by Charlton Haws, [place] since 06/05/2021 12:00 AM     Problem: Hypertension, Hyperlipidemia, Diabetes, Heart Failure, Coronary Artery Disease, and Chronic Kidney Disease   Priority: High     Long-Range Goal: Disease management   Start Date: 06/05/2021  Expected  End Date: 12/06/2021  This Visit's Progress: On track  Priority: High  Note:   Current Barriers:  Unable to independently monitor therapeutic efficacy Unable to refill  Humalog from [place]):  Patient will achieve adherence to monitoring guidelines and medication adherence to achieve therapeutic efficacy through collaboration with PharmD and provider.   Interventions: 1:1 collaboration with Binnie Rail, MD regarding development and update of comprehensive plan of care as evidenced by provider attestation and co-signature Inter-disciplinary care team collaboration (see longitudinal plan of care) Comprehensive medication review performed; medication list updated in electronic medical record  Heart Failure / Hypertension    HF Type: Combined Systolic and Diastolic Last ejection [id number]:81% (07/29/2020, improved) BP goal is:  <140/90   Patient checks BP at home: infrequently - meter Patient home BP readings are ranging: not checking   Patient has failed these meds in past: atenolol Patient is currently controlled on the following medications: Carvedilol 25 mg AM and 12.5 mg PM Losartan 100 mg daily Furosemide 40 mg daily PRN Chlorthalidone 25 mg daily Amlodipine 5 mg daily   Atenolol 25 mg daily   We discussed: Pt reports compliance with meds above - he is not sure who prescribes atenolol and it is listed as historical med; discussed duplicate therapy with carvedilol and risk for bradycardia with 2 beta blockers;  -discussed importance of maintaining BP at goal to prevent further kidney damage; discussed substances that can increase BP (caffeine, tobacco, steroids) and benefits of exercise - pt reports he just enrolled in exercise/rehab program at the [facility]: Stop atenolol if pt is still taking it Check BP 3x a week at home   Hyperlipidemia / CAD    LDL goal < 70 CAD - NSTEMI 06/2020 w/ DES; BMS 2001, DES 2005; stroke 2013 Managed per Dr Oval Linsey - cont indefinite DAPT.   Patient has failed these meds in past: n/a Patient is currently controlled on the following medications: Atorvastatin 40 mg  daily Clopidogrel 75 mg daily HS Aspirin 81 mg daily Nitroglycerin 0.4 mg SL prn   We discussed:  LDL is at goal; pt endorses adherence and denies side effects    Plan: Continue current medications and control with diet and exercise   Diabetes    A1c goal < 7% without hypoglycemia Checking BG: 2x per Day Recent FBG Readings: 120-140s   Patient has failed these meds in past: n/a Patient is currently uncontrolled on the following medications: Trulicity 1.91 mg once weekly  Humalog mix 50/50 32 units with breakfast   We discussed: Pt reports [employer] PAP was approved for 2022; he has been out of Humalog since Sunday 7/10 and has not been able to get med refilled to [facility] from endocrine office; per chart the Rx was faxed on 7/13; contacted [facility], they have the Rx but need patient to call to schedule shipment (will not accept requests on his behalf); advised pt to call [employer] to schedule shipment as soon as possible -in meantime, pt is completely out of insulin and unable to afford insulin copays; provided sample of Toujeo (only insulin available today) and asked pt to use 22 units daily until Humalog arrives, then return to normal 32 units of Humalog daily   Plan: Continue current medications  Use Toujeo - 22 units daily until Humalog arrives from [facility]    CKD Stage 4  Managed per Dr. Joylene Grapes. Plan to start dialysis discussion this year.  We discussed:  importance of controlling BP and DM to prevent further kidney damage  Plan: Continue current medications  Patient Goals/Self-Care Activities Patient will:  - take medications as prescribed focus on medication adherence by pill box check glucose daily, document, and provide at future appointments check blood pressure 3 x a week, document, and provide at future appointments collaborate with provider on medication access solutions -Contact [employer] to schedule Humalog shipment -Use Toujeo 22 units  (sample) until Humalog arrives -Stop Atenolol     Medication Assistance:  [employer] - Humalog, Trulicity (approved through 11/21/21)  Compliance/Adherence/Medication fill history: Care Gaps: Shingrix Covid booster (due 01/14/21) Eye exam (due 09/23/18)  Star-Rating Drugs: Atorvastatin - LF 0/35/59 x 90 ds  Trulicity - via PAP Losartan  - LF 09/03/20 x 90 ds --Pt reports he received refills of all medications on time from [facility]  Patient's preferred pharmacy is:  [facility] - [place], [place] [address] Phone: [phone] Fax: [phone]  [facility] [place], [place] - [address] [place] [address] Phone: [phone] Fax: [phone]  Uses pill box? Yes Pt endorses 100% compliance  We discussed: Current pharmacy is preferred with insurance plan and patient is satisfied with pharmacy services Patient decided to: Continue current medication management strategy  Care Plan and Follow Up Patient Decision:  Patient agrees to Care Plan and Follow-up.  Plan: Telephone follow up appointment with care management team member scheduled for:  3 months  Charlene Brooke, PharmD, [place], CPP Clinical Pharmacist [facility] at [facility] [phone]

## 2021-06-04 NOTE — Patient Instructions (Signed)
STOP taking Atenolol if you are still taking it. This is too similar to carvedilol that you already take.  Start checking blood pressure 3-4 times a week when you feel number. Goal BP <130/80  TOUJEO insulin sample provided - please take 22 units daily since it works differently than Humalog and we want to avoid low blood sugar.  Once you receive HUMALOG 50/50 from [place] resume normal dosing of 32 units daily with breakfast.  [facility]   Phone number for Pharmacist: [phone]   Goals Addressed             This Visit's Progress    Manage My Medicine       Timeframe:  Long-Range Goal Priority:  High Start Date:    06/05/21                         Expected End Date: 12/06/21                      Follow Up Date Oct 2022   - call for medicine refill 2 or 3 days before it runs out - call if I am sick and can't take my medicine - keep a list of all the medicines I take; vitamins and herbals too - use a pillbox to sort medicine  -Contact [employer] to schedule Humalog shipment -Use Toujeo 22 units (sample) until Humalog arrives -Stop Atenolol   Why is this important?   These steps will help you keep on track with your medicines.   Notes:         Patient verbalizes understanding of instructions provided today and agrees to view in [place].  Telephone follow up appointment with pharmacy team member scheduled for: 3 months  Charlene Brooke, PharmD, [place], CPP Clinical Pharmacist [facility] at [facility] [phone]

## 2021-06-23 ENCOUNTER — Other Ambulatory Visit: Payer: Self-pay | Admitting: Endocrinology

## 2021-06-29 ENCOUNTER — Telehealth: Payer: Self-pay | Admitting: Pharmacist

## 2021-06-29 ENCOUNTER — Other Ambulatory Visit: Payer: Self-pay

## 2021-06-29 ENCOUNTER — Encounter: Payer: Self-pay | Admitting: Podiatry

## 2021-06-29 ENCOUNTER — Telehealth: Payer: Self-pay

## 2021-06-29 ENCOUNTER — Ambulatory Visit (INDEPENDENT_AMBULATORY_CARE_PROVIDER_SITE_OTHER): Payer: Medicare HMO | Admitting: Podiatry

## 2021-06-29 DIAGNOSIS — E1142 Type 2 diabetes mellitus with diabetic polyneuropathy: Secondary | ICD-10-CM

## 2021-06-29 DIAGNOSIS — B351 Tinea unguium: Secondary | ICD-10-CM

## 2021-06-29 DIAGNOSIS — M79674 Pain in right toe(s): Secondary | ICD-10-CM | POA: Diagnosis not present

## 2021-06-29 DIAGNOSIS — M79675 Pain in left toe(s): Secondary | ICD-10-CM | POA: Diagnosis not present

## 2021-06-29 NOTE — Progress Notes (Signed)
This patient returns to my office for at risk foot care.  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  Patient is taking plavix.  Patient has history of CVA.  This patient is unable to cut nails himself since the patient cannot reach his nails.These nails are painful walking and wearing shoes.  This patient presents for at risk foot care today.  General Appearance  Alert, conversant and in no acute stress.  Vascular  Dorsalis pedis and posterior tibial  pulses are weakly  palpable  bilaterally.  Capillary return is within normal limits  Bilaterally.Cold feet  Bilaterally.  Absent digital hair  B/L.  Neurologic  Senn-Weinstein monofilament wire test diminished  bilaterally. Muscle power within normal limits bilaterally.  Nails Thick disfigured discolored nails with subungual debris  from hallux to fifth toes bilaterally. No evidence of bacterial infection or drainage bilaterally.  Orthopedic  No limitations of motion  feet .  No crepitus or effusions noted.  No bony pathology or digital deformities noted.  Skin  normotropic skin with no porokeratosis noted bilaterally.  No signs of infections or ulcers noted.  Midfoot DJD right.  Hammer toes  B/L.   Onychomycosis  Pain in right toes  Pain in left toes  Consent was obtained for treatment procedures.   Mechanical debridement of nails 1-5  bilaterally performed with a nail nipper.  Filed with dremel without incident.    Return office visit   3 months                   Told patient to return for periodic foot care and evaluation due to potential at risk complications.   Gardiner Barefoot DPM

## 2021-06-29 NOTE — Progress Notes (Addendum)
Called and spoke with patient and he informed me that he received his shipment of Humalog last week on Aug. 2nd and he has been taking it and tolerating it well.  Orinda Kenner, RMA Clinical Pharmacists Assistant [phone]  Time Spent: 10   Called patient to inform him to call [facility] to schedule his Humalog shipment? Patient mailbox was full and I was unable to leave a voice message.  Orinda Kenner, [place] Clinical Pharmacists Assistant [phone]  Time Spent: [phone]

## 2021-06-29 NOTE — Telephone Encounter (Signed)
Called to discuss availability/interest in attending next PREP Class at [facility] starting August 22 10am, every M/W for 12 weeks. He has some conflicts with appointments at the [facility] in August so will contact him in September when next PREP class is scheduled.

## 2021-08-03 ENCOUNTER — Encounter (HOSPITAL_BASED_OUTPATIENT_CLINIC_OR_DEPARTMENT_OTHER): Payer: Self-pay | Admitting: Cardiovascular Disease

## 2021-08-03 ENCOUNTER — Other Ambulatory Visit: Payer: Self-pay

## 2021-08-03 ENCOUNTER — Ambulatory Visit (INDEPENDENT_AMBULATORY_CARE_PROVIDER_SITE_OTHER): Payer: Medicare HMO | Admitting: Cardiovascular Disease

## 2021-08-03 VITALS — BP 118/70 | HR 77 | Ht 71.0 in | Wt 217.2 lb

## 2021-08-03 DIAGNOSIS — Z9861 Coronary angioplasty status: Secondary | ICD-10-CM

## 2021-08-03 DIAGNOSIS — R0609 Other forms of dyspnea: Secondary | ICD-10-CM

## 2021-08-03 DIAGNOSIS — I10 Essential (primary) hypertension: Secondary | ICD-10-CM

## 2021-08-03 DIAGNOSIS — N183 Chronic kidney disease, stage 3 unspecified: Secondary | ICD-10-CM

## 2021-08-03 DIAGNOSIS — I447 Left bundle-branch block, unspecified: Secondary | ICD-10-CM | POA: Diagnosis not present

## 2021-08-03 DIAGNOSIS — I2510 Atherosclerotic heart disease of native coronary artery without angina pectoris: Secondary | ICD-10-CM | POA: Diagnosis not present

## 2021-08-03 DIAGNOSIS — R0600 Dyspnea, unspecified: Secondary | ICD-10-CM | POA: Diagnosis not present

## 2021-08-03 DIAGNOSIS — G4733 Obstructive sleep apnea (adult) (pediatric): Secondary | ICD-10-CM

## 2021-08-03 DIAGNOSIS — Z9989 Dependence on other enabling machines and devices: Secondary | ICD-10-CM

## 2021-08-03 NOTE — Assessment & Plan Note (Signed)
Chronic and unchanged ?

## 2021-08-03 NOTE — Assessment & Plan Note (Signed)
[name] last underwent PCI 06/2020.  Indefinite dual antiplatelet therapy was recommended due to multiple OM stents.  Lately he has noted increased diaphoresis with exercise.  His exercise is limited due to prior stroke.  I am concerned that this could be a sign of angina.  He had significant diaphoresis prior to his prior stents.  We will continue aspirin, atorvastatin, clopidogrel, and atorvastatin.  He will get Lexiscan Myoview to assess for ischemia.

## 2021-08-03 NOTE — Assessment & Plan Note (Signed)
Follows with [facility] 

## 2021-08-03 NOTE — Assessment & Plan Note (Signed)
Blood pressure is very well have been controlled.  Continue carvedilol, chlorthalidone, amlodipine, and losartan.  He very rarely uses Lasix.

## 2021-08-03 NOTE — Patient Instructions (Addendum)
Medication Instructions:  Your physician recommends that you continue on your current medications as directed. Please refer to the Current Medication list given to you today.   *If you need a refill on your cardiac medications before your next appointment, please call your pharmacy*  Lab Work: FASTING LP/CMET WHEN YOU HAVE YOUR STRESS TEST   If you have labs (blood work) drawn today and your tests are completely normal, you will receive your results only by: [place] (if you have MyChart) OR A paper copy in the mail If you have any lab test that is abnormal or we need to change your treatment, we will call you to review the results.  Testing/Procedures: Your physician has requested that you have a lexiscan myoview. For further information please visit [email or web address]. Please follow instruction sheet, as given.  Follow-Up: At [facility], you and your health needs are our priority.  As part of our continuing mission to provide you with exceptional heart care, we have created designated Provider Care Teams.  These Care Teams include your primary Cardiologist (physician) and Advanced Practice Providers (APPs -  Physician Assistants and Nurse Practitioners) who all work together to provide you with the care you need, when you need it.  We recommend signing up for the patient portal called "MyChart".  Sign up information is provided on this After Visit Summary.  MyChart is used to connect with patients for Virtual Visits (Telemedicine).  Patients are able to view lab/test results, encounter notes, upcoming appointments, etc.  Non-urgent messages can be sent to your provider as well.   To learn more about what you can do with MyChart, go to [email or web address].    Your next appointment:   12 month(s)  The format for your next appointment:   In Person  Provider:   Skeet Latch, MD or Laurann [place], NP

## 2021-08-03 NOTE — Progress Notes (Signed)
Cardiology Office Note   Date:  08/03/2021   ID:  [name], DOB Sep 26, 1951, MRN [id number]  PCP:  Binnie Rail, MD  Cardiologist:  Skeet Latch, MD  Electrophysiologist:  None  Nephrologist: Dr. Joylene Grapes  Evaluation Performed:  Follow-Up Visit  Chief Complaint:  hypertension  History of Present Illness:    [name] is a 70 y.o. male with hypertension, CAD status post multiple PCI, CVA, diabetes mellitus type 2, hyperlipidemia, and prior tobacco abuse who presents for follow up.  [name] was referred by Dr. Billey Gosling on 02/09/16. [name] and PCI with a BMS to OM while living in [place].  He had in-stent restenosis in 2005 and had a DES placed.   At that time he reported occasional episodes of chest pain relieved by aspirin.  He denied exertional chest pain but did note dizziness and nausea.  Additionally, he was noted to have a left bundle branch block that was not present on his last EKG in 2013. Therefore, he was referred for exercise Myoview 02/13/16 that revealed LVEF 39% with global hypokinesis but no ischemia.  Echo 03/01/16 revealed LV 40-45% with mild LVH and grade 2 diastolic dysfunction. There was also mild hypokinesis of the mid to apical anteroseptum.   He worked with our pharmacists and had achieved very good blood pressure control.  He is in a program at the [place] that checks his BP daily.    [name] was seen in the ED 06/2020 with a complaint of two months of chest pain, abdominal pain, back pain, and gas.  High-sensitivity troponin was elevated at 32.  EKG showed sinus rhythm with a left bundle branch block, which is chronic.  He left prior to receiving a follow-up lab because of COVID-19 related back up in the ED.  He followed up in the office a couple days later and high-sensitivity troponin was 124.  He was direct admitted to the hospital where he underwent cardiac catheterization.  His cath 8/19 revealed 95% stenosis of the ostial OM 2 just proximal  to his prior OM stent.  A drug-eluting stent was successfully placed.  In follow-up he continued to be much better.  He had a repeat echo 07/2020 that revealed an improvement in his LVEF to 55% with grade 1 diastolic dysfunction.  Right atrial pressure was 3 mmHg.  He was referred for sleep study and found to have severe sleep apnea.    At his last appointment he was doing well. Today, he reports completing rehab and now has more feeling in his extremities. At home he has not checked his blood pressure lately. Last night he experienced some LE edema after completing some household tasks, but has had no other issues with swelling. Also, he continues to exercise daily. He endorses diaphoresis when he walks or participates in strenuous exercise. This is not a new symptom but he has noticed it worsening since 2 months ago. When his sugar goes below 70 he will also become diaphoretic. He is unable to sleep all night with his CPAP, it is waking him up possibly due to noise and non-optimal settings. Last week he had a blood test done at [facility]. He denies any palpitations, chest pain, or shortness of breath. No lightheadedness, headaches, syncope, orthopnea, or PND.    Past Medical History:  Diagnosis Date   ALLERGIC RHINITIS    CAD, NATIVE VESSEL    BMS to OM1 2001, DES to BMS 2005   Chronic back pain  herniated disc   Chronic combined systolic and diastolic heart failure ([place]) 06/29/2016   Constipation    takes Carafate four times day   DIAB W/UNSPEC COMP TYPE II/UNSPEC TYPE UNCNTRL    ERECTILE DYSFUNCTION    GERD    HYPERLIPIDEMIA-MIXED    HYPERTENSION, BENIGN    LBBB (left bundle branch block) 02/02/2016   MORTON'S NEUROMA, RIGHT    OSA on CPAP 09/09/2020   Peripheral neuropathy    Seasonal allergies    takes Allegra and Benadryl daily prn;uses Flonase daily   SHOULDER PAIN, RIGHT    Snoring 09/09/2020   Stroke, 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) 07/09/2020    Past Surgical History:  Procedure Laterality Date   ANGIOPLASTY     COLONOSCOPY     CORONARY ANGIOPLASTY  2005   2 stents   CORONARY STENT INTERVENTION N/A 07/10/2020   Procedure: CORONARY STENT INTERVENTION;  Surgeon: Martinique, Peter M, MD;  Location: [facility];  Service: Cardiovascular;  Laterality: N/A;   DENTAL SURGERY     LARYNGOPLASTY  08/07/2012   Procedure: LARYNGOPLASTY;  Surgeon: Izora Gala, MD;  Location: [facility];  Service: ENT;  Laterality: Left;  Left Vocal Cord Medialyzation   LEFT HEART CATH AND CORONARY ANGIOGRAPHY N/A 07/10/2020   Procedure: LEFT HEART CATH AND CORONARY ANGIOGRAPHY;  Surgeon: Martinique, Peter M, MD;  Location: [facility];  Service: Cardiovascular;  Laterality: N/A;   left knee surgury     x 2    stent  2001, 2004   coronary stents     Current Outpatient Medications  Medication Sig Dispense Refill   amLODipine (NORVASC) 5 MG tablet Take 5 mg by mouth daily.     aspirin EC 81 MG tablet Take 1 tablet (81 mg total) by mouth daily. Swallow whole. 30 tablet 11   atorvastatin (LIPITOR) 40 MG tablet Take 1 tablet (40 mg total) by mouth daily. 90 tablet 3   Blood Glucose Monitoring Suppl (ONETOUCH VERIO) w/Device KIT USE METER TO CHECK GLUCOSE ONCE DAILY AS DIRECTED 1 kit 0   Blood Pressure Monitoring (BLOOD PRESSURE CUFF) MISC 1 Units by Does not apply route daily. 1 each 0   calcium carbonate (TUMS - DOSED IN MG ELEMENTAL CALCIUM) 500 MG chewable tablet Chew 1 tablet by mouth daily as needed for indigestion or heartburn.     carvedilol (COREG) 12.5 MG tablet TAKE 1 & 1/2 (ONE & ONE-HALF) TABLETS BY MOUTH TWICE DAILY 270 tablet 2   cetirizine (ZYRTEC) 10 MG tablet Take 1 tablet by mouth daily as needed.     chlorthalidone (HYGROTON) 25 MG tablet Take 1 tablet (25 mg total) by mouth daily. 90 tablet 3   clopidogrel (PLAVIX) 75 MG tablet Take 1 tablet (75 mg total) by mouth daily. Please schedule  annual appt in April for refills. Thank you 90 tablet 3   Dulaglutide (TRULICITY) 4.85 IO/2.7OJ SOPN Inject 0.5 mLs (0.75 mg total) into the skin once a week. 6 mL 3   furosemide (LASIX) 40 MG tablet Take 40 mg by mouth as needed.     glucose blood (ONETOUCH VERIO) test strip 1 each by Other route 2 (two) times daily. And lancets 2/day. 200 each 3   Insulin Lispro Prot & Lispro (HUMALOG MIX 50/50 KWIKPEN) (50-50) 100 UNIT/ML Kwikpen Inject 32 Units into the skin daily with breakfast. 45 mL 1  losartan (COZAAR) 100 MG tablet Take 1 tablet (100 mg total) by mouth daily. 90 tablet 3   OneTouch Delica Lancets 76B MISC 1 each by Does not apply route 2 (two) times daily. Use to monitor glucose levels BID; E11.9 100 each 3   RELION PEN NEEDLES 31G X 6 MM MISC USE 1 PEN NEEDLE ONCE DAILY 50 each 0   senna-docusate (SENOKOT-S) 8.6-50 MG per tablet Take 2 tablets by mouth 2 (two) times daily. For constipation.     nitroGLYCERIN (NITROSTAT) 0.4 MG SL tablet Place 1 tablet (0.4 mg total) under the tongue every 5 (five) minutes as needed for chest pain (up to 3 doses). 25 tablet 3   No current facility-administered medications for this visit.    Allergies:   Penicillins, Pneumococcal vaccines, Shellfish allergy, Influenza vaccines, Lisinopril, Topiramate, Amlodipine, Levitra [vardenafil], Metformin, and Tadalafil    Social History:  The patient  reports that he quit smoking about 38 years ago. His smoking use included cigarettes. He has a 22.50 pack-year smoking history. He has never used smokeless tobacco. He reports that he does not drink alcohol and does not use drugs.   Family History:  The patient's family history includes Cancer in his mother.    ROS:   Please see the history of present illness. (+) Exertional diaphoresis All other systems are reviewed and negative.    PHYSICAL EXAM: VS:  BP 118/70   Pulse 77   Ht _0  (1.803 m)   Wt 217 lb 3.2 oz (98.5 kg)   BMI 30.29 kg/m  , BMI Body  mass index is 30.29 kg/m. GENERAL:  Well appearing HEENT: Pupils equal round and reactive, fundi not visualized, oral mucosa unremarkable NECK:  No jugular venous distention, waveform within normal limits, carotid upstroke brisk and symmetric, no bruits LUNGS:  Clear to auscultation bilaterally HEART:  RRR.  PMI not displaced or sustained,S1 and S2 within normal limits, no S3, no S4, no clicks, no rubs, no  murmurs ABD:  Flat, positive bowel sounds normal in frequency in pitch, no bruits, no rebound, no guarding, no midline pulsatile mass, no hepatomegaly, no splenomegaly EXT:  2 plus pulses throughout, no edema, no cyanosis no clubbing SKIN:  No rashes no nodules NEURO:  L sided weakness and foot drop.  L arm intention tremor PSYCH:  Cognitively intact, oriented to person place and time   EKG:   08/03/2021: Sinus rhythm. Rate 77 bpm. Left bundle branch block. 02/03/21: Sinus rhythm.  Rate 72 bpm.  Left bundle branch block. 08/08/2020: Sinus rhythm.  Rate 74 bpm.  Left bundle branch block. 07/16/20: Sinus rhythm.  Rate 80 bpm.  LBBB. 07/09/20: Sinus rhythm.  Rate 91 bpm.  LBBB. 09/01/18: Sinus rhythm.  Rate 71 bpm.  LBBB 07/20/17: Sinus rhythm. Rate 74 bpm. Left bundle branch block. 3//13/17: sinus rhythm, rate 64 bpm.  LBBB.   s CXR PA and Lateral 02/19/2021 (CE): FINDINGS:  Cardiovascular: Mild enlargement of the cardiac silhouette.  Mediastinum: Within normal limits.  Lungs/pleura: No pneumothorax or pleural effusion. No consolidation or pulmonary edema.  Upper abdomen: Visualized portions are unremarkable.  Chest wall/osseous structures: No acute osseous findings.    CONCLUSION:  Mild enlargement of the cardiac silhouette without frank pulmonary edema or other acute cardiopulmonary abnormality.  Echo 07/29/2020: IMPRESSIONS    1. Left ventricular ejection fraction, by estimation, is 55%. The left  ventricle has normal function. The left ventricle has no regional wall  motion  abnormalities but there was septal-lateral  dyssynchrony consistent  with LBBB. Left ventricular diastolic   parameters are consistent with Grade I diastolic dysfunction (impaired  relaxation).   2. Right ventricular systolic function is normal. The right ventricular  size is normal. There is normal pulmonary artery systolic pressure. The  estimated right ventricular systolic pressure is 84.1 mmHg.   3. The aortic valve is tricuspid. Aortic valve regurgitation is not  visualized. No aortic stenosis is present.   4. The mitral valve is normal in structure. Trivial mitral valve  regurgitation. No evidence of mitral stenosis.   5. The inferior vena cava is normal in size with greater than 50%  respiratory variability, suggesting right atrial pressure of 3 mmHg.   LHC 07/10/20:  2nd Mrg-1 lesion is 95% stenosed. 2nd Mrg-2 lesion is 30% stenosed. RPDA lesion is 100% stenosed. A drug-eluting stent was successfully placed using a STENT RESOLUTE ONYX 2.75X8. Post intervention, there is a 0% residual stenosis. Post intervention, there is a 0% residual stenosis. LV end diastolic pressure is normal.   1. 2 vessel obstructive CAD    -95% ostial OM2. This was at the proximal margin of the old stents    - 100% distal PDA. This fills by left to right collaterals. 2. Normal LVEDP 3. Successful PCI of the ostium of the OM with DES x 1.   Plan: DAPT indefinitely given multiple stents in OM. Anticipate same day DC. Will hold losartan and HCTZ until renal function repeated as outpatient.   Lexiscan Myoview 10/2018: Nuclear stress EF: 43%. The left ventricular ejection fraction is moderately decreased (30-44%). Defect 1: There is a medium defect of moderate severity present in the mid anteroseptal, mid inferoseptal, apical septal and apex location. This septal defect is likely due to the LBBB. This is an intermediate risk study. This study is similar to the previous study in 2017.  ABI 08/2018: Normal  bilaterally  Echo 09/20/18: Study Conclusions   - Left ventricle: abnormal septal motion. The cavity size was    mildly dilated. Wall thickness was increased in a pattern of    severe LVH. Systolic function was mildly to moderately reduced.    The estimated ejection fraction was in the range of 40% to 45%.    Doppler parameters are consistent with abnormal left ventricular    relaxation (grade 1 diastolic dysfunction).  - Atrial septum: There was increased thickness of the septum,    consistent with lipomatous hypertrophy. No defect or patent    foramen ovale was identified.   Carotid Doppler 10/24/14: Normal study   Recent Labs: 09/10/2020: BUN 41; Creatinine, Ser 2.29; Potassium 3.8; Sodium 142    Lipid Panel    Component Value Date/Time   CHOL 112 04/15/2020 1128   CHOL 164 07/20/2017 1202   TRIG 136.0 04/15/2020 1128   TRIG 200 04/20/2009 0000   HDL 44.60 04/15/2020 1128   HDL 55 07/20/2017 1202   CHOLHDL 3 04/15/2020 1128   VLDL 27.2 04/15/2020 1128   LDLCALC 40 04/15/2020 1128   LDLCALC 82 07/20/2017 1202   LDLDIRECT 63.0 01/12/2016 0931    Wt Readings from Last 3 Encounters:  08/03/21 217 lb 3.2 oz (98.5 kg)  05/11/21 217 lb 6.4 oz (98.6 kg)  02/05/21 219 lb 3.2 oz (99.4 kg)    11/03/2020: Sodium 143, potassium 4.2, BUN 27, creatinine 1.68  ASSESSMENT AND PLAN: LBBB (left bundle branch block) Chronic and unchanged  CAD S/P percutaneous coronary angioplasty [name] last underwent PCI 06/2020.  Indefinite dual antiplatelet  therapy was recommended due to multiple OM stents.  Lately he has noted increased diaphoresis with exercise.  His exercise is limited due to prior stroke.  I am concerned that this could be a sign of angina.  He had significant diaphoresis prior to his prior stents.  We will continue aspirin, atorvastatin, clopidogrel, and atorvastatin.  He will get Lexiscan Myoview to assess for ischemia.  Essential hypertension Blood pressure is very  well have been controlled.  Continue carvedilol, chlorthalidone, amlodipine, and losartan.  He very rarely uses Lasix.  CRI (chronic renal insufficiency), stage 3 (moderate) Follows with [facility].  OSA on CPAP Continue CPAP.  He is struggling to get his mask and settings right.   Current medicines are reviewed at length with the patient today.  The patient does not have concerns regarding medicines.  The following changes have been made: No changes  Labs/ tests ordered today include:    Orders Placed This Encounter  Procedures   Lipid panel   Comprehensive metabolic panel   MYOCARDIAL PERFUSION IMAGING   EKG 12-Lead      Disposition:   FU with Kofi Murrell C. Oval Linsey, MD, [facility] in 1 year.   I,Mathew Stumpf,acting as a Education administrator for Skeet Latch, MD.,have documented all relevant documentation on the behalf of Skeet Latch, MD,as directed by  Skeet Latch, MD while in the presence of Skeet Latch, MD.  I, [place] Oval Linsey, MD have reviewed all documentation for this visit.  The documentation of the exam, diagnosis, procedures, and orders on 08/03/2021 are all accurate and complete.   Signed, Cindra Austad C. Oval Linsey, MD, [facility]  08/03/2021 11:08 AM    [place]

## 2021-08-03 NOTE — Assessment & Plan Note (Signed)
Continue CPAP.  He is struggling to get his mask and settings right.

## 2021-08-05 ENCOUNTER — Other Ambulatory Visit: Payer: Self-pay | Admitting: Endocrinology

## 2021-08-07 ENCOUNTER — Encounter (HOSPITAL_COMMUNITY): Payer: Medicare HMO

## 2021-08-12 ENCOUNTER — Telehealth (HOSPITAL_COMMUNITY): Payer: Self-pay | Admitting: *Deleted

## 2021-08-12 NOTE — Telephone Encounter (Signed)
Close encounter 

## 2021-08-14 ENCOUNTER — Other Ambulatory Visit: Payer: Self-pay

## 2021-08-14 ENCOUNTER — Ambulatory Visit (HOSPITAL_COMMUNITY)
Admission: RE | Admit: 2021-08-14 | Discharge: 2021-08-14 | Disposition: A | Discharge status: Home / Self Care | Payer: Medicare HMO | Source: Ambulatory Visit | Attending: Cardiovascular Disease | Admitting: Cardiovascular Disease

## 2021-08-14 DIAGNOSIS — R0600 Dyspnea, unspecified: Secondary | ICD-10-CM | POA: Diagnosis not present

## 2021-08-14 DIAGNOSIS — R0609 Other forms of dyspnea: Secondary | ICD-10-CM

## 2021-08-14 DIAGNOSIS — I10 Essential (primary) hypertension: Secondary | ICD-10-CM | POA: Diagnosis not present

## 2021-08-14 LAB — MYOCARDIAL PERFUSION IMAGING
LV dias vol: 105 mL (ref 62–150)
LV sys vol: 61 mL
Nuc Stress EF: 42 %
Peak HR: 100 {beats}/min
Rest HR: 74 {beats}/min
Rest Nuclear Isotope Dose: 10.3 mCi
SDS: 4
SRS: 5
SSS: 9
Stress Nuclear Isotope Dose: 31.2 mCi
TID: 0.96

## 2021-08-14 MED ORDER — TECHNETIUM TC 99M TETROFOSMIN IV KIT
31.2000 | PACK | Freq: Once | INTRAVENOUS | Status: AC | PRN
  Administered 2021-08-14: 31.2 via INTRAVENOUS
  Filled 2021-08-14: qty 32

## 2021-08-14 MED ORDER — REGADENOSON 0.4 MG/5ML IV SOLN
0.4000 mg | Freq: Once | INTRAVENOUS | Status: AC
  Administered 2021-08-14: 0.4 mg via INTRAVENOUS

## 2021-08-14 MED ORDER — TECHNETIUM TC 99M TETROFOSMIN IV KIT
10.3000 | PACK | Freq: Once | INTRAVENOUS | Status: AC | PRN
  Administered 2021-08-14: 10.3 via INTRAVENOUS
  Filled 2021-08-14: qty 11

## 2021-08-18 ENCOUNTER — Other Ambulatory Visit: Payer: Self-pay

## 2021-08-18 ENCOUNTER — Ambulatory Visit (INDEPENDENT_AMBULATORY_CARE_PROVIDER_SITE_OTHER): Payer: Medicare HMO | Admitting: Endocrinology

## 2021-08-18 VITALS — BP 130/64 | HR 90 | Ht 71.0 in | Wt 213.0 lb

## 2021-08-18 DIAGNOSIS — Z794 Long term (current) use of insulin: Secondary | ICD-10-CM | POA: Diagnosis not present

## 2021-08-18 DIAGNOSIS — E119 Type 2 diabetes mellitus without complications: Secondary | ICD-10-CM | POA: Diagnosis not present

## 2021-08-18 LAB — POCT GLYCOSYLATED HEMOGLOBIN (HGB A1C): Hemoglobin A1C: 6.6 % — AB (ref 4.0–5.6)

## 2021-08-18 NOTE — Patient Instructions (Addendum)
Please continue the same Trulicity, and reduce the insulin to 30 units with breakfast. On this type of insulin schedule, you should eat meals on a regular schedule (especially breakfast and lunch).  If a meal is missed or significantly delayed, your blood sugar could go low.   If you run out of insulin, you should buy a bottle of 70/30 insulin at [facility].  This is not the same as what you are on, but it is close.  You should ask a friend to draw up 1 week at a time. We are placing a continuous glucose monitor.   Please come back for a follow-up appointment in 2 weeks.   check your blood sugar twice a day.  vary the time of day when you check, between before the 3 meals, and at bedtime.  also check if you have symptoms of your blood sugar being too high or too low.  please keep a record of the readings and bring it to your next appointment here (or you can bring the meter itself).  You can write it on any piece of paper.  please call us sooner if your blood sugar goes below 70, or if you have a lot of readings over 200.

## 2021-08-18 NOTE — Progress Notes (Signed)
Subjective:    Patient ID: [name], male    DOB: June 03, 1951, 70 y.o.   MRN: [id number]  HPI Pt returns for f/u of diabetes mellitus:  DM type: Insulin-requiring type 2 Dx'ed: 0923 Complications: PN, CAD, CRI, DR, and TIA.   Therapy: insulin since 3007, and Trulicity.   DKA: never.   Severe hypoglycemia: last episode was early 2020.   Pancreatitis: never.  SDOH: he gets meds from mfgr--pt assistance; pt lives alone.  Dtr visits [facility].  Other: He says he cannot use syringe and vial, due to tremor; he changed lantus to NPH, then 70/30, then 50/50, due to pattern on cbg's; he declines multiple daily injections; Trulicity dosage is limited by cough and sob; fructosamine has confirmed A1c.   Interval history: He says he never misses the meds.  He says insulin is 32 units qam.  pt states he feels well in general.  No recent steroids.  no cbg record, but states cbg's vary from 79-180.  He seldom has hypoglycemia, and these episodes are mild.   Past Medical History:  Diagnosis Date   ALLERGIC RHINITIS    CAD, NATIVE VESSEL    BMS to [place] 2001, DES to BMS 2005   Chronic back pain    herniated disc   Chronic combined systolic and diastolic heart failure ([place]) 06/29/2016   Constipation    takes Carafate four times day   DIAB W/UNSPEC COMP TYPE II/UNSPEC TYPE UNCNTRL    ERECTILE DYSFUNCTION    GERD    HYPERLIPIDEMIA-MIXED    HYPERTENSION, BENIGN    LBBB (left bundle branch block) 02/02/2016   MORTON'S NEUROMA, RIGHT    OSA on CPAP 09/09/2020   Peripheral neuropathy    Seasonal allergies    takes Allegra and Benadryl daily prn;uses Flonase daily   SHOULDER PAIN, RIGHT    Snoring 09/09/2020   Stroke, 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   L HP + hemiparesis, s/p CIR    TIA on medication 02/2012   Unstable angina ([place]) 07/09/2020    Past Surgical History:  Procedure Laterality Date   ANGIOPLASTY     COLONOSCOPY     CORONARY ANGIOPLASTY  2005   2 stents   CORONARY STENT INTERVENTION N/A  07/10/2020   Procedure: CORONARY STENT INTERVENTION;  Surgeon: Martinique, Peter M, MD;  Location: [facility];  Service: Cardiovascular;  Laterality: N/A;   DENTAL SURGERY     LARYNGOPLASTY  08/07/2012   Procedure: LARYNGOPLASTY;  Surgeon: Izora Gala, MD;  Location: [facility];  Service: ENT;  Laterality: Left;  Left Vocal Cord Medialyzation   LEFT HEART CATH AND CORONARY ANGIOGRAPHY N/A 07/10/2020   Procedure: LEFT HEART CATH AND CORONARY ANGIOGRAPHY;  Surgeon: Martinique, Peter M, MD;  Location: [facility];  Service: Cardiovascular;  Laterality: N/A;   left knee surgury     x 2    stent  2001, 2004   coronary stents    Social History   Socioeconomic History   Marital status: Legally Separated    Spouse name: Not on file   Number of children: 3   Years of education: college   Highest education level: Not on file  Occupational History   Occupation: disabled    Employer: [employer]  Tobacco Use   Smoking status: Former    Packs/day: 2.50    Years: 9.00    Pack years: 22.50    Types: Cigarettes    Quit date: 02/24/1983    Years since quitting: 61.5  Smokeless tobacco: Never  Vaping Use   Vaping Use: Never used  Substance and Sexual Activity   Alcohol use: No    Alcohol/week: 0.0 standard drinks   Drug use: No   Sexual activity: Never  Other Topics Concern   Not on file  Social History Narrative      Social Determinants of Health   Financial Resource Strain: Low Risk    Difficulty of Paying Living Expenses: Not hard at all  Food Insecurity: No Food Insecurity   Worried About Charity fundraiser in the Last Year: Never true   [place] in the Last Year: Never true  Transportation Needs: No Transportation Needs   Lack of Transportation (Medical): No   Lack of Transportation (Non-Medical): No  Physical Activity: Inactive   Days of Exercise per Week: 0 days   Minutes of Exercise per Session: 0 min  Stress: No Stress Concern Present   Feeling of Stress : Not  at all  Social Connections: Moderately Integrated   Frequency of Communication with Friends and Family: More than three times a week   Frequency of Social Gatherings with Friends and Family: More than three times a week   Attends Religious Services: 1 to 4 times per year   Active Member of [employer] or Organizations: No   Attends Music therapist: 1 to 4 times per year   Marital Status: Never married  Human resources officer Violence: Not on file    Current Outpatient Medications on File Prior to Visit  Medication Sig Dispense Refill   amLODipine (NORVASC) 5 MG tablet Take 5 mg by mouth daily.     aspirin EC 81 MG tablet Take 1 tablet (81 mg total) by mouth daily. Swallow whole. 30 tablet 11   atorvastatin (LIPITOR) 40 MG tablet Take 1 tablet (40 mg total) by mouth daily. 90 tablet 3   Blood Glucose Monitoring Suppl (ONETOUCH VERIO) w/Device KIT USE METER TO CHECK GLUCOSE ONCE DAILY AS DIRECTED 1 kit 0   Blood Pressure Monitoring (BLOOD PRESSURE CUFF) MISC 1 Units by Does not apply route daily. 1 each 0   calcium carbonate (TUMS - DOSED IN MG ELEMENTAL CALCIUM) 500 MG chewable tablet Chew 1 tablet by mouth daily as needed for indigestion or heartburn.     carvedilol (COREG) 12.5 MG tablet TAKE 1 & 1/2 (ONE & ONE-HALF) TABLETS BY MOUTH TWICE DAILY 270 tablet 2   cetirizine (ZYRTEC) 10 MG tablet Take 1 tablet by mouth daily as needed.     chlorthalidone (HYGROTON) 25 MG tablet Take 1 tablet (25 mg total) by mouth daily. 90 tablet 3   clopidogrel (PLAVIX) 75 MG tablet Take 1 tablet (75 mg total) by mouth daily. Please schedule annual appt in April for refills. Thank you 90 tablet 3   Dulaglutide (TRULICITY) 3.66 YQ/0.3KV SOPN Inject 0.5 mLs (0.75 mg total) into the skin once a week. 6 mL 3   furosemide (LASIX) 40 MG tablet Take 40 mg by mouth as needed.     glucose blood (ONETOUCH VERIO) test strip 1 each by Other route 2 (two) times daily. And lancets 2/day. 200 each 3   Insulin Lispro Prot  & Lispro (HUMALOG MIX 50/50 KWIKPEN) (50-50) 100 UNIT/ML Kwikpen Inject 32 Units into the skin daily with breakfast. 45 mL 1   losartan (COZAAR) 100 MG tablet Take 1 tablet (100 mg total) by mouth daily. 90 tablet 3   OneTouch Delica Lancets 42V MISC 1 each by Does  not apply route 2 (two) times daily. Use to monitor glucose levels BID; E11.9 100 each 3   RELION PEN NEEDLES 31G X 6 MM MISC USE 1 PEN NEEDLE ONCE DAILY 50 each 0   senna-docusate (SENOKOT-S) 8.6-50 MG per tablet Take 2 tablets by mouth 2 (two) times daily. For constipation.     nitroGLYCERIN (NITROSTAT) 0.4 MG SL tablet Place 1 tablet (0.4 mg total) under the tongue every 5 (five) minutes as needed for chest pain (up to 3 doses). 25 tablet 3   No current facility-administered medications on file prior to visit.    Allergies  Allergen Reactions   Penicillins Anaphylaxis   Pneumococcal Vaccines Anaphylaxis   Shellfish Allergy Anaphylaxis   Influenza Vaccines    Lisinopril Cough   Topiramate Other (See Comments)    Chest spasms and numbness   Amlodipine Other (See Comments)    LE edema   Levitra [Vardenafil] Other (See Comments)    headaches   Metformin Nausea And Vomiting   Tadalafil Other (See Comments)    dizziness    Family History  Problem Relation Age of Onset   Cancer Mother     BP 130/64 (BP Location: Right Arm, Patient Position: Sitting, Cuff Size: Large)   Pulse 90   Ht 5' 11" (1.803 m)   Wt 213 lb (96.6 kg)   SpO2 98%   BMI 29.71 kg/m    Review of Systems He has HB and nausea.      Objective:   Physical Exam Pulses: dorsalis pedis intact bilat.   MSK: no deformity of the feet CV: no leg edema Skin:  no ulcer on the feet.  normal color and temp on the feet. Neuro: sensation is intact to touch on the feet.     A1c=6.6%    Assessment & Plan:  Insulin-requiring type 2 DM: overcontrolled.   Patient Instructions  Please continue the same Trulicity, and reduce the insulin to 30 units with  breakfast. On this type of insulin schedule, you should eat meals on a regular schedule (especially breakfast and lunch).  If a meal is missed or significantly delayed, your blood sugar could go low.   If you run out of insulin, you should buy a bottle of 70/30 insulin at [facility].  This is not the same as what you are on, but it is close.  You should ask a friend to draw up 1 week at a time. We are placing a continuous glucose monitor.   Please come back for a follow-up appointment in 2 weeks.   check your blood sugar twice a day.  vary the time of day when you check, between before the 3 meals, and at bedtime.  also check if you have symptoms of your blood sugar being too high or too low.  please keep a record of the readings and bring it to your next appointment here (or you can bring the meter itself).  You can write it on any piece of paper.  please call us sooner if your blood sugar goes below 70, or if you have a lot of readings over 200.

## 2021-08-26 ENCOUNTER — Telehealth: Payer: Self-pay

## 2021-08-26 NOTE — Telephone Encounter (Signed)
Called to discuss next Prep class at [facility], wants to attend next class on 10/18 every T/Th 12-1:15; assessment visit scheduled for 10/13 at 1pm

## 2021-09-01 ENCOUNTER — Ambulatory Visit (INDEPENDENT_AMBULATORY_CARE_PROVIDER_SITE_OTHER): Payer: Medicare HMO | Admitting: Endocrinology

## 2021-09-01 ENCOUNTER — Other Ambulatory Visit: Payer: Self-pay

## 2021-09-01 DIAGNOSIS — E119 Type 2 diabetes mellitus without complications: Secondary | ICD-10-CM | POA: Diagnosis not present

## 2021-09-01 DIAGNOSIS — Z794 Long term (current) use of insulin: Secondary | ICD-10-CM

## 2021-09-01 MED ORDER — HUMALOG MIX 50/50 KWIKPEN (50-50) 100 UNIT/ML ~~LOC~~ SUPN: 28.0000 [IU] | PEN_INJECTOR | Freq: Every day | SUBCUTANEOUS | 1 refills | Status: DC

## 2021-09-01 MED ORDER — FREESTYLE LIBRE 2 SENSOR MISC: 1.0000 | 3 refills | Status: DC

## 2021-09-01 NOTE — Progress Notes (Signed)
Subjective:    Patient ID: [name], male    DOB: 01-04-1951, 70 y.o.   MRN: [id number]  HPI Pt returns for f/u of diabetes mellitus:  DM type: Insulin-requiring type 2 Dx'ed: 8413 Complications: PN, CAD, CRI, DR, and TIA.   Therapy: insulin since 2440, and Trulicity.   DKA: never.   Severe hypoglycemia: last episode was early 2020.   Pancreatitis: never.  SDOH: he gets meds from mfgr--pt assistance; pt lives alone.  Dtr visits [facility].  Other: He says he cannot use syringe and vial, due to tremor; he changed lantus to NPH, then 70/30, then 50/50, due to pattern on cbg's; he declines multiple daily injections; Trulicity dosage is limited by cough and sob; fructosamine has confirmed A1c; hea eats meals at 11AM, 2PM, and 8PM.   Interval history: He says he never misses the meds.  pt states he feels well in general.  No recent steroids.  I reviewed continuous glucose monitor data.  Glucose varies from 50-180. It is in general highest at [facility] and 11 PM.  It decreases overnight, and is lowest at 6AM.  He seldom has hypoglycemia, and these episodes are mild.  Past Medical History:  Diagnosis Date   ALLERGIC RHINITIS    CAD, NATIVE VESSEL    BMS to [place] 2001, DES to BMS 2005   Chronic back pain    herniated disc   Chronic combined systolic and diastolic heart failure ([place]) 06/29/2016   Constipation    takes Carafate four times day   DIAB W/UNSPEC COMP TYPE II/UNSPEC TYPE UNCNTRL    ERECTILE DYSFUNCTION    GERD    HYPERLIPIDEMIA-MIXED    HYPERTENSION, BENIGN    LBBB (left bundle branch block) 02/02/2016   MORTON'S NEUROMA, RIGHT    OSA on CPAP 09/09/2020   Peripheral neuropathy    Seasonal allergies    takes Allegra and Benadryl daily prn;uses Flonase daily   SHOULDER PAIN, RIGHT    Snoring 09/09/2020   Stroke, 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   L HP + hemiparesis, s/p CIR    TIA on medication 02/2012   Unstable angina ([place]) 07/09/2020    Past Surgical History:  Procedure Laterality  Date   ANGIOPLASTY     COLONOSCOPY     CORONARY ANGIOPLASTY  2005   2 stents   CORONARY STENT INTERVENTION N/A 07/10/2020   Procedure: CORONARY STENT INTERVENTION;  Surgeon: Martinique, Peter M, MD;  Location: [facility] [place] [facility];  Service: Cardiovascular;  Laterality: N/A;   DENTAL SURGERY     LARYNGOPLASTY  08/07/2012   Procedure: LARYNGOPLASTY;  Surgeon: Izora Gala, MD;  Location: [facility];  Service: ENT;  Laterality: Left;  Left Vocal Cord Medialyzation   LEFT HEART CATH AND CORONARY ANGIOGRAPHY N/A 07/10/2020   Procedure: LEFT HEART CATH AND CORONARY ANGIOGRAPHY;  Surgeon: Martinique, Peter M, MD;  Location: [facility];  Service: Cardiovascular;  Laterality: N/A;   left knee surgury     x 2    stent  2001, 2004   coronary stents    Social History   Socioeconomic History   Marital status: Legally Separated    Spouse name: Not on file   Number of children: 3   Years of education: college   Highest education level: Not on file  Occupational History   Occupation: disabled    Employer: [employer]  Tobacco Use   Smoking status: Former    Packs/day: 2.50    Years: 9.00    Pack years:  22.50    Types: Cigarettes    Quit date: 02/24/1983    Years since quitting: 38.5   Smokeless tobacco: Never  Vaping Use   Vaping Use: Never used  Substance and Sexual Activity   Alcohol use: No    Alcohol/week: 0.0 standard drinks   Drug use: No   Sexual activity: Never  Other Topics Concern   Not on file  Social History Narrative      Social Determinants of Health   Financial Resource Strain: Low Risk    Difficulty of Paying Living Expenses: Not hard at all  Food Insecurity: No Food Insecurity   Worried About Charity fundraiser in the Last Year: Never true   [place] in the Last Year: Never true  Transportation Needs: No Transportation Needs   Lack of Transportation (Medical): No   Lack of Transportation (Non-Medical): No  Physical Activity: Inactive   Days of Exercise per  Week: 0 days   Minutes of Exercise per Session: 0 min  Stress: No Stress Concern Present   Feeling of Stress : Not at all  Social Connections: Moderately Integrated   Frequency of Communication with Friends and Family: More than three times a week   Frequency of Social Gatherings with Friends and Family: More than three times a week   Attends Religious Services: 1 to 4 times per year   Active Member of [employer] or Organizations: No   Attends Music therapist: 1 to 4 times per year   Marital Status: Never married  Human resources officer Violence: Not on file    Current Outpatient Medications on File Prior to Visit  Medication Sig Dispense Refill   amLODipine (NORVASC) 5 MG tablet Take 5 mg by mouth daily.     aspirin EC 81 MG tablet Take 1 tablet (81 mg total) by mouth daily. Swallow whole. 30 tablet 11   atorvastatin (LIPITOR) 40 MG tablet Take 1 tablet (40 mg total) by mouth daily. 90 tablet 3   Blood Glucose Monitoring Suppl (ONETOUCH VERIO) w/Device KIT USE METER TO CHECK GLUCOSE ONCE DAILY AS DIRECTED 1 kit 0   Blood Pressure Monitoring (BLOOD PRESSURE CUFF) MISC 1 Units by Does not apply route daily. 1 each 0   calcium carbonate (TUMS - DOSED IN MG ELEMENTAL CALCIUM) 500 MG chewable tablet Chew 1 tablet by mouth daily as needed for indigestion or heartburn.     carvedilol (COREG) 12.5 MG tablet TAKE 1 & 1/2 (ONE & ONE-HALF) TABLETS BY MOUTH TWICE DAILY 270 tablet 2   cetirizine (ZYRTEC) 10 MG tablet Take 1 tablet by mouth daily as needed.     chlorthalidone (HYGROTON) 25 MG tablet Take 1 tablet (25 mg total) by mouth daily. 90 tablet 3   clopidogrel (PLAVIX) 75 MG tablet Take 1 tablet (75 mg total) by mouth daily. Please schedule annual appt in April for refills. Thank you 90 tablet 3   Dulaglutide (TRULICITY) 7.41 OI/7.8MV SOPN Inject 0.5 mLs (0.75 mg total) into the skin once a week. 6 mL 3   furosemide (LASIX) 40 MG tablet Take 40 mg by mouth as needed.     glucose blood  (ONETOUCH VERIO) test strip 1 each by Other route 2 (two) times daily. And lancets 2/day. 200 each 3   losartan (COZAAR) 100 MG tablet Take 1 tablet (100 mg total) by mouth daily. 90 tablet 3   OneTouch Delica Lancets 67M MISC 1 each by Does not apply route 2 (two) times  daily. Use to monitor glucose levels BID; E11.9 100 each 3   RELION PEN NEEDLES 31G X 6 MM MISC USE 1 PEN NEEDLE ONCE DAILY 50 each 0   senna-docusate (SENOKOT-S) 8.6-50 MG per tablet Take 2 tablets by mouth 2 (two) times daily. For constipation.     nitroGLYCERIN (NITROSTAT) 0.4 MG SL tablet Place 1 tablet (0.4 mg total) under the tongue every 5 (five) minutes as needed for chest pain (up to 3 doses). 25 tablet 3   No current facility-administered medications on file prior to visit.    Allergies  Allergen Reactions   Penicillins Anaphylaxis   Pneumococcal Vaccines Anaphylaxis   Shellfish Allergy Anaphylaxis   Influenza Vaccines    Lisinopril Cough   Topiramate Other (See Comments)    Chest spasms and numbness   Amlodipine Other (See Comments)    LE edema   Levitra [Vardenafil] Other (See Comments)    headaches   Metformin Nausea And Vomiting   Tadalafil Other (See Comments)    dizziness    Family History  Problem Relation Age of Onset   Cancer Mother     BP (!) 100/46 (BP Location: Right Arm, Patient Position: Sitting, Cuff Size: Large)   Pulse 82   Ht 5' 11"  (1.803 m)   Wt 215 lb (97.5 kg)   SpO2 98%   BMI 29.99 kg/m    Review of Systems He has HB    Objective:   Physical Exam   Lab Results  Component Value Date   HGBA1C 6.6 (A) 08/18/2021      Assessment & Plan:  HB, due to Trulicity. We can't increase, at least for now Insulin-requiring type 2 DM: uncontrolled  Patient Instructions  Please continue the same Trulicity, and reduce the insulin to 28 units with breakfast. On this type of insulin schedule, you should eat meals on a regular schedule (especially breakfast and lunch).  If a  meal is missed or significantly delayed, your blood sugar could go low.   If you run out of insulin, you should buy a bottle of 70/30 insulin at [facility].  This is not the same as what you are on, but it is close.  You should ask a friend to draw up 1 week at a time.   Please come back for a follow-up appointment in 2 months.  I have sent a prescription to your pharmacy, for the continuous glucose monitor sensors.  Please see our educator, to learn about this and the phone app.   check your blood sugar twice a day.  vary the time of day when you check, between before the 3 meals, and at bedtime.  also check if you have symptoms of your blood sugar being too high or too low.  please keep a record of the readings and bring it to your next appointment here (or you can bring the meter itself).  You can write it on any piece of paper.  please call us sooner if your blood sugar goes below 70, or if you have a lot of readings over 200.

## 2021-09-01 NOTE — Patient Instructions (Addendum)
Please continue the same Trulicity, and reduce the insulin to 28 units with breakfast. On this type of insulin schedule, you should eat meals on a regular schedule (especially breakfast and lunch).  If a meal is missed or significantly delayed, your blood sugar could go low.   If you run out of insulin, you should buy a bottle of 70/30 insulin at [facility].  This is not the same as what you are on, but it is close.  You should ask a friend to draw up 1 week at a time.   Please come back for a follow-up appointment in 2 months.  I have sent a prescription to your pharmacy, for the continuous glucose monitor sensors.  Please see our educator, to learn about this and the phone app.   check your blood sugar twice a day.  vary the time of day when you check, between before the 3 meals, and at bedtime.  also check if you have symptoms of your blood sugar being too high or too low.  please keep a record of the readings and bring it to your next appointment here (or you can bring the meter itself).  You can write it on any piece of paper.  please call us sooner if your blood sugar goes below 70, or if you have a lot of readings over 200.

## 2021-09-03 NOTE — Progress Notes (Signed)
[facility] PREP Evaluation  Patient Details  Name: [name] MRN: [id number] Date of Birth: Apr 11, 1951 Age: 70 y.o. PCP: Binnie Rail, MD  Vitals:   09/03/21 1506  BP: 114/60  Pulse: 87  SpO2: 99%  Weight: 214 lb (97.1 kg)     [facility] Eval - 09/03/21 1500       [facility] "PREP" Location   [facility] "PREP" Location [facility]      Referral    Referring Provider Oval Linsey    Reason for referral Hypertension;Diabetes    Program Start Date 09/08/21      Measurement   Neck measurement 15.5 Inches    Hip Circumference 42.5 inches    Body fat 31.1 percent      Information for Trainer   Goals --   Establish exercise routine, both cardio and strength training   Current Exercise none    Orthopedic Concerns --   L sided hemiparesis, weakness, tremor from prior CVA   Pertinent Medical History --   HTN, CVA, diabetes, OSA   Current Barriers grandkids    Restrictions/Precautions Fall risk;Assistive device      Timed Up and Go (TUGS)   Timed Up and Go High risk >13 seconds      Mobility and Daily Activities   I find it easy to walk up or down two or more flights of stairs. 1    I have no trouble taking out the trash. 1    I do housework such as vacuuming and dusting on my own without difficulty. 2    I can easily lift a gallon of milk (8lbs). 4    I can easily walk a mile. 1    I have no trouble reaching into high cupboards or reaching down to pick up something from the floor. 1    I do not have trouble doing out-door work such as Armed forces logistics/support/administrative officer, raking leaves, or gardening. 1      Mobility and Daily Activities   I feel younger than my age. 2    I feel independent. 4    I feel energetic. 2    I live an active life.  2    I feel strong. 2    I feel healthy. 2    I feel active as other people my age. 2      How fit and strong are you.   Fit and Strong Total Score 27            Past Medical History:  Diagnosis Date   ALLERGIC RHINITIS    CAD, NATIVE VESSEL     BMS to [place] 2001, DES to BMS 2005   Chronic back pain    herniated disc   Chronic combined systolic and diastolic heart failure ([place]) 06/29/2016   Constipation    takes Carafate four times day   DIAB W/UNSPEC COMP TYPE II/UNSPEC TYPE UNCNTRL    ERECTILE DYSFUNCTION    GERD    HYPERLIPIDEMIA-MIXED    HYPERTENSION, BENIGN    LBBB (left bundle branch block) 02/02/2016   MORTON'S NEUROMA, RIGHT    OSA on CPAP 09/09/2020   Peripheral neuropathy    Seasonal allergies    takes Allegra and Benadryl daily prn;uses Flonase daily   SHOULDER PAIN, RIGHT    Snoring 09/09/2020   Stroke, 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;  Service: ENT;  Laterality: Left;  Left Vocal Cord Medialyzation   LEFT HEART CATH AND CORONARY ANGIOGRAPHY N/A 07/10/2020   Procedure: LEFT HEART CATH AND CORONARY ANGIOGRAPHY;  Surgeon: Martinique, Peter M, MD;  Location: [place] [facility];  Service: Cardiovascular;  Laterality: N/A;   left knee surgury     x 2    stent  2001, 2004   coronary stents   Social History   Tobacco Use  Smoking Status Former   Packs/day: 2.50   Years: 9.00   Pack years: 22.50   Types: Cigarettes   Quit date: 02/24/1983   Years since quitting: 38.5  Smokeless Tobacco Never  To begin PREP classes October 18 at [facility], every T/Th 12-1:15  [place] 09/03/2021, 3:10 PM

## 2021-09-08 NOTE — Progress Notes (Signed)
[facility] PREP Weekly Session  Patient Details  Name: [name] MRN: [id number] Date of Birth: 1951-01-06 Age: 70 y.o. PCP: Binnie Rail, MD  There were no vitals filed for this visit.   [facility] Weekly seesion - 09/08/21 1400       [facility] "PREP" Location   [facility] "PREP" Location [facility]      Weekly Session   Topic Discussed Goal setting and welcome to the program   Introductions, review of PREP notebook, weekly self reports, tour of facility            Yevonne Aline 09/08/2021, 2:23 PM

## 2021-09-10 ENCOUNTER — Encounter: Payer: Self-pay | Admitting: Endocrinology

## 2021-09-15 NOTE — Progress Notes (Signed)
[facility] PREP Weekly Session  Patient Details  Name: [name] MRN: [id number] Date of Birth: Apr 29, 1951 Age: 70 y.o. PCP: Binnie Rail, MD  There were no vitals filed for this visit.   [facility] Weekly seesion - 09/15/21 1300       [facility] "PREP" Location   [facility] "PREP" Location [facility]      Weekly Session   Topic Discussed Importance of resistance training;Other ways to be active;Water   Goals: 150 minutes cardio/wk, strength training 2-3 times/wk for 20-40 mins; water intake: 1/2 body wt in oz or 64 oz/day initially   Classes attended to date [place] 09/15/2021, 1:51 PM

## 2021-09-16 ENCOUNTER — Other Ambulatory Visit: Payer: Self-pay | Admitting: Cardiovascular Disease

## 2021-09-17 ENCOUNTER — Other Ambulatory Visit: Payer: Self-pay | Admitting: Cardiovascular Disease

## 2021-09-22 NOTE — Progress Notes (Signed)
Follow up phone call at 1:15: he had driven home, reports feeling much better, advised to contact his doctor if similar event happens again.

## 2021-09-29 NOTE — Progress Notes (Signed)
[facility] PREP Weekly Session  Patient Details  Name: [name] MRN: [id number] Date of Birth: 02-16-1951 Age: 70 y.o. PCP: Binnie Rail, MD  Vitals:   09/29/21 1348  Weight: 213 lb 6.4 oz (96.8 kg)     [facility] Weekly seesion - 09/29/21 1300       [facility] "PREP" Location   [facility] "PREP" Location [facility]      Weekly Session   Topic Discussed Health habits   sugar demo   Minutes exercised this week 120 minutes    Classes attended to date [place] 09/29/2021, 1:49 PM

## 2021-09-30 ENCOUNTER — Encounter: Payer: Self-pay | Admitting: Podiatry

## 2021-09-30 ENCOUNTER — Other Ambulatory Visit: Payer: Self-pay | Admitting: Endocrinology

## 2021-09-30 ENCOUNTER — Ambulatory Visit (INDEPENDENT_AMBULATORY_CARE_PROVIDER_SITE_OTHER): Payer: Medicare HMO | Admitting: Podiatry

## 2021-09-30 ENCOUNTER — Other Ambulatory Visit: Payer: Self-pay

## 2021-09-30 DIAGNOSIS — E1142 Type 2 diabetes mellitus with diabetic polyneuropathy: Secondary | ICD-10-CM

## 2021-09-30 DIAGNOSIS — B351 Tinea unguium: Secondary | ICD-10-CM | POA: Diagnosis not present

## 2021-09-30 DIAGNOSIS — M792 Neuralgia and neuritis, unspecified: Secondary | ICD-10-CM | POA: Diagnosis not present

## 2021-09-30 DIAGNOSIS — M79675 Pain in left toe(s): Secondary | ICD-10-CM | POA: Diagnosis not present

## 2021-09-30 DIAGNOSIS — M79674 Pain in right toe(s): Secondary | ICD-10-CM

## 2021-09-30 NOTE — Progress Notes (Signed)
This patient returns to my office for at risk foot care.  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  Patient is taking plavix.  Patient has history of CVA.  This patient is unable to cut nails himself since the patient cannot reach his nails.These nails are painful walking and wearing shoes.  This patient presents for at risk foot care today. ? ?General Appearance  Alert, conversant and in no acute stress. ? ?Vascular  Dorsalis pedis and posterior tibial  pulses are weakly  palpable  bilaterally.  Capillary return is within normal limits  Bilaterally.Cold feet left.  .  Absent digital hair  B/L. ? ?Neurologic  Senn-Weinstein monofilament wire test diminished  bilaterally. Muscle power within normal limits bilaterally. ? ?Nails Thick disfigured discolored nails with subungual debris  from hallux to fifth toes bilaterally. No evidence of bacterial infection or drainage bilaterally. ? ?Orthopedic  No limitations of motion  feet .  No crepitus or effusions noted.  No bony pathology or digital deformities noted. ? ?Skin  normotropic skin with no porokeratosis noted bilaterally.  No signs of infections or ulcers noted.  Midfoot DJD right.  Hammer toes  B/L.  ? ?Onychomycosis  Pain in right toes  Pain in left toes ? ?Consent was obtained for treatment procedures.   Mechanical debridement of nails 1-5  bilaterally performed with a nail nipper.  Filed with dremel without incident. Patient was told to use pumice stone heels  B/L and continue using vaseline. ? ? ?Return office visit   3 months                   Told patient to return for periodic foot care and evaluation due to potential at risk complications. ? ? ?Daneshia Tavano DPM  ?

## 2021-10-06 ENCOUNTER — Other Ambulatory Visit: Payer: Self-pay | Admitting: Cardiovascular Disease

## 2021-10-06 NOTE — Progress Notes (Signed)
[facility] PREP Weekly Session  Patient Details  Name: [name] MRN: [id number] Date of Birth: 01-17-1951 Age: 70 y.o. PCP: Binnie Rail, MD  Vitals:   10/06/21 1529  Weight: 217 lb (98.4 kg)     [facility] Weekly seesion - 10/06/21 1500       [facility] "PREP" Location   [facility] "PREP" Location [facility]      Weekly Session   Topic Discussed Restaurant Eating   salt demo; NA intake limited to  1500-2300mg    Minutes exercised this week 75 minutes    Classes attended to date [place] 10/06/2021, 3:31 PM

## 2021-10-13 NOTE — Progress Notes (Signed)
[facility] PREP Weekly Session  Patient Details  Name: [name] MRN: [id number] Date of Birth: [birth year] Age: 70 y.o. PCP: Binnie Rail, MD  Vitals:   10/13/21 1326  Weight: 214 lb (97.1 kg)     [facility] Weekly seesion - 10/13/21 1300       [facility] "PREP" Location   [facility] "PREP" Location [facility]      Weekly Session   Topic Discussed Stress management and problem solving   fruit bowl meditation, finger tip breathwork   Minutes exercised this week 105 minutes    Classes attended to date [place] 10/13/2021, 1:28 PM

## 2021-10-20 NOTE — Progress Notes (Signed)
[facility] PREP Weekly Session  Patient Details  Name: [name] MRN: [id number] Date of Birth: [birth year] Age: 70 y.o. PCP: Binnie Rail, MD  Vitals:   10/20/21 1552  Weight: 215 lb 6.4 oz (97.7 kg)     [facility] Weekly seesion - 10/20/21 1500       [facility] "PREP" Location   [facility] "PREP" Location [facility]      Weekly Session   Topic Discussed Expectations and non-scale victories   Halfway through program; encouraged to review, refocus, revisit, restate goals   Minutes exercised this week 90 minutes    Classes attended to date [place] 10/20/2021, 3:53 PM

## 2021-10-27 NOTE — Progress Notes (Signed)
[facility] PREP Weekly Session  Patient Details  Name: [name] MRN: [id number] Date of Birth: [birth year] Age: 70 y.o. PCP: Binnie Rail, MD  Vitals:   10/27/21 1257  Weight: 211 lb (95.7 kg)     [facility] Weekly seesion - 10/27/21 1200       [facility] "PREP" Location   [facility] "PREP" Location [facility]      Weekly Session   Topic Discussed Other   Portion control, review labels, visualize portion size demo; to bring in labels from home to review next week.   Minutes exercised this week 105 minutes    Classes attended to date [place] 10/27/2021, 1:01 PM

## 2021-10-29 ENCOUNTER — Telehealth: Payer: Self-pay | Admitting: Dietician

## 2021-10-29 NOTE — Telephone Encounter (Signed)
Returned patient call. Patient is enquiring why he has not received the FreeStyle libre.  [place] who states that the Elenor Legato is not covered by his insurance.  [facility] and started a PA.   Reference number is [id number] Information regarding the status of this PA can be found by calling [phone] or going to [email or web address].   They are to fax the status to [place] [facility].  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  Informed patient of current status and recommended that he refill his prescriptions for strips as he will require these for back up if he gets the [place] as well as current need.  He is currently not testing.  Antonieta Iba, RD, LDN, CDCES

## 2021-10-30 ENCOUNTER — Telehealth: Payer: Self-pay | Admitting: Internal Medicine

## 2021-10-30 NOTE — Telephone Encounter (Signed)
Patient states urologist states he has an infection, patient states he was proscribed an antibiotic that he is allergic to  Patient requesting provider to prescribed antibiotic  Encouraged patient to call urologist, patient stated he prefer to see his provider  Patient requesting a call back to discuss request  Next ov 12-16

## 2021-10-30 NOTE — Telephone Encounter (Signed)
Since urology identified the infection and at this point they may know what the bacteria is susceptible to he needs to call urology.  The treatment also varies if it is a urinary tract infection or a prostate infection.

## 2021-11-02 NOTE — Telephone Encounter (Signed)
Attempted to reach patient but his voicemail is full.  If he returns call to clinic okay to tell him he needs to call Urology to follow up with them regarding the abx (antibiotic) per Dr. Quay Burow.

## 2021-11-02 NOTE — Telephone Encounter (Signed)
Also sent patient a my-chart message today regarding following up.

## 2021-11-03 ENCOUNTER — Ambulatory Visit (INDEPENDENT_AMBULATORY_CARE_PROVIDER_SITE_OTHER): Payer: Medicare HMO | Admitting: Endocrinology

## 2021-11-03 ENCOUNTER — Other Ambulatory Visit: Payer: Self-pay

## 2021-11-03 VITALS — BP 100/50 | HR 78 | Ht 71.0 in | Wt 214.2 lb

## 2021-11-03 DIAGNOSIS — Z794 Long term (current) use of insulin: Secondary | ICD-10-CM | POA: Diagnosis not present

## 2021-11-03 DIAGNOSIS — E119 Type 2 diabetes mellitus without complications: Secondary | ICD-10-CM

## 2021-11-03 LAB — POCT GLYCOSYLATED HEMOGLOBIN (HGB A1C): Hemoglobin A1C: 6.8 % — AB (ref 4.0–5.6)

## 2021-11-03 MED ORDER — HUMALOG MIX 50/50 KWIKPEN (50-50) 100 UNIT/ML ~~LOC~~ SUPN: 26.0000 [IU] | PEN_INJECTOR | Freq: Every day | SUBCUTANEOUS | 1 refills | Status: DC

## 2021-11-03 MED ORDER — FREESTYLE LIBRE 2 SENSOR MISC: 1.0000 | 3 refills | Status: DC

## 2021-11-03 MED ORDER — ONETOUCH VERIO VI STRP: 1.0000 | ORAL_STRIP | Freq: Two times a day (BID) | 3 refills | Status: DC

## 2021-11-03 NOTE — Patient Instructions (Addendum)
Please continue the same Trulicity, and reduce the insulin to 26 units with breakfast. On this type of insulin schedule, you should eat meals on a regular schedule (especially breakfast and lunch).  If a meal is missed or significantly delayed, your blood sugar could go low.   If you run out of insulin, you should buy a bottle of 70/30 insulin at [facility].  This is not the same as what you are on, but it is close.  You should ask a friend to draw up 1 week at a time.   Please come back for a follow-up appointment in 2 months.  I have sent a prescription to a different pharmacy, for the sensors, to try to get it covered under part B check your blood sugar twice a day.  vary the time of day when you check, between before the 3 meals, and at bedtime.  also check if you have symptoms of your blood sugar being too high or too low.  please keep a record of the readings and bring it to your next appointment here (or you can bring the meter itself).  You can write it on any piece of paper.  please call us sooner if your blood sugar goes below 70, or if you have a lot of readings over 200.

## 2021-11-03 NOTE — Progress Notes (Signed)
Subjective:    Patient ID: [name], male    DOB: 21-Jun-1951, 70 y.o.   MRN: [id number]  HPI Pt returns for f/u of diabetes mellitus:  DM type: Insulin-requiring type 2 Dx'ed: 4098 Complications: PN, CAD, CRI, DR, and TIA.   Therapy: insulin since 1191, and Trulicity.   DKA: never.   Severe hypoglycemia: last episode was early 2020.   Pancreatitis: never.  SDOH: he gets meds from mfgr--pt assistance; pt lives alone.  Dtr visits BIW.  Other: He says he cannot use syringe and vial, due to tremor; he changed lantus to NPH, then 70/30, then 50/50, due to pattern on cbg's; he declines multiple daily injections; Trulicity dosage is limited by cough and sob; fructosamine has confirmed A1c; he eats meals at 11AM, 2PM, and 8PM.   Interval history: He says he never misses the meds.  pt states he feels well in general.  No recent steroids.  [employer] declined continuous glucose monitor.  Glucose varies from 80-130.  He has hypoglycemia approx BIW.   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   Constipation    takes Carafate four times day   DIAB W/UNSPEC COMP TYPE II/UNSPEC TYPE UNCNTRL    ERECTILE DYSFUNCTION    GERD    HYPERLIPIDEMIA-MIXED    HYPERTENSION, BENIGN    LBBB (left bundle branch block) 02/02/2016   MORTON'S NEUROMA, RIGHT    OSA on CPAP 09/09/2020   Peripheral neuropathy    Seasonal allergies    takes Allegra and Benadryl daily prn;uses Flonase daily   SHOULDER PAIN, RIGHT    Snoring 09/09/2020   Stroke, 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   L HP + hemiparesis, s/p CIR    TIA on medication 02/2012   Unstable angina ([place]) 07/09/2020    Past Surgical History:  Procedure Laterality Date   ANGIOPLASTY     COLONOSCOPY     CORONARY ANGIOPLASTY  2005   2 stents   CORONARY STENT INTERVENTION N/A  07/10/2020   Procedure: CORONARY STENT INTERVENTION;  Surgeon: Martinique, Peter M, MD;  Location: [place] [facility];  Service: Cardiovascular;  Laterality: N/A;   DENTAL SURGERY     LARYNGOPLASTY  08/07/2012   Procedure: LARYNGOPLASTY;  Surgeon: Izora Gala, MD;  Location: [place];  Service: ENT;  Laterality: Left;  Left Vocal Cord Medialyzation   LEFT HEART CATH AND CORONARY ANGIOGRAPHY N/A 07/10/2020   Procedure: LEFT HEART CATH AND CORONARY ANGIOGRAPHY;  Surgeon: Martinique, Peter M, MD;  Location: [place] [facility];  Service: Cardiovascular;  Laterality: N/A;   left knee surgury     x 2    stent  2001, 2004   coronary stents    Social History   Socioeconomic History   Marital status: Legally Separated    Spouse name: Not on file   Number of children: 3   Years of education: college   Highest education level: Not on file  Occupational History   Occupation: disabled    Employer: [employer]  Tobacco Use   Smoking status: Former    Packs/day: 2.50    Years: 9.00    Pack years: 22.50    Types: Cigarettes    Quit date: 02/24/1983    Years since quitting: 38.7   Smokeless tobacco:  Never  Vaping Use   Vaping Use: Never used  Substance and Sexual Activity   Alcohol use: No    Alcohol/week: 0.0 standard drinks   Drug use: No   Sexual activity: Never  Other Topics Concern   Not on file  Social History Narrative      Social Determinants of Health   Financial Resource Strain: Low Risk    Difficulty of Paying Living Expenses: Not hard at all  Food Insecurity: No Food Insecurity   Worried About Charity fundraiser in the Last Year: Never true   [place] in the Last Year: Never true  Transportation Needs: No Transportation Needs   Lack of Transportation (Medical): No   Lack of Transportation (Non-Medical): No  Physical Activity: Inactive   Days of Exercise per Week: 0 days   Minutes of Exercise per Session: 0 min  Stress: No Stress Concern Present   Feeling of Stress : Not  at all  Social Connections: Moderately Integrated   Frequency of Communication with Friends and Family: More than three times a week   Frequency of Social Gatherings with Friends and Family: More than three times a week   Attends Religious Services: 1 to 4 times per year   Active Member of [employer] or Organizations: No   Attends Music therapist: 1 to 4 times per year   Marital Status: Never married  Human resources officer Violence: Not on file    Current Outpatient Medications on File Prior to Visit  Medication Sig Dispense Refill   amLODipine (NORVASC) 5 MG tablet Take 5 mg by mouth daily.     aspirin EC 81 MG tablet Take 1 tablet (81 mg total) by mouth daily. Swallow whole. 30 tablet 11   atorvastatin (LIPITOR) 40 MG tablet Take 1 tablet (40 mg total) by mouth daily. 90 tablet 3   Blood Pressure Monitoring (BLOOD PRESSURE CUFF) MISC 1 Units by Does not apply route daily. 1 each 0   calcium carbonate (TUMS - DOSED IN MG ELEMENTAL CALCIUM) 500 MG chewable tablet Chew 1 tablet by mouth daily as needed for indigestion or heartburn.     carvedilol (COREG) 12.5 MG tablet TAKE 1 AND 1/2 TABLETS TWICE DAILY 270 tablet 2   cetirizine (ZYRTEC) 10 MG tablet Take 1 tablet by mouth daily as needed.     chlorthalidone (HYGROTON) 25 MG tablet Take 1 tablet (25 mg total) by mouth daily. 90 tablet 3   clopidogrel (PLAVIX) 75 MG tablet Take 1 tablet (75 mg total) by mouth daily. Please schedule annual appt in April for refills. Thank you 90 tablet 3   Dulaglutide (TRULICITY) 1.61 WR/6.0AV SOPN Inject 0.5 mLs (0.75 mg total) into the skin once a week. 6 mL 3   furosemide (LASIX) 40 MG tablet TAKE 1 TABLET (40 MG TOTAL) BY MOUTH DAILY. NEED APPT. 90 tablet 3   losartan (COZAAR) 100 MG tablet TAKE 1 TABLET EVERY DAY 90 tablet 3   OneTouch Delica Lancets 40J MISC 1 each by Does not apply route 2 (two) times daily. Use to monitor glucose levels BID; E11.9 100 each 3   RELION PEN NEEDLES 31G X 6 MM MISC USE  1 PEN NEEDLE ONCE DAILY 50 each 0   senna-docusate (SENOKOT-S) 8.6-50 MG per tablet Take 2 tablets by mouth 2 (two) times daily. For constipation.     nitroGLYCERIN (NITROSTAT) 0.4 MG SL tablet Place 1 tablet (0.4 mg total) under the tongue every 5 (five) minutes  as needed for chest pain (up to 3 doses). 25 tablet 3   No current facility-administered medications on file prior to visit.    Allergies  Allergen Reactions   Penicillins Anaphylaxis   Pneumococcal Vaccines Anaphylaxis   Shellfish Allergy Anaphylaxis   Influenza Vaccines    Lisinopril Cough   Topiramate Other (See Comments)    Chest spasms and numbness   Amlodipine Other (See Comments)    LE edema   Levitra [Vardenafil] Other (See Comments)    headaches   Metformin Nausea And Vomiting   Tadalafil Other (See Comments)    dizziness    Family History  Problem Relation Age of Onset   Cancer Mother     BP (!) 100/50    Pulse 78    Ht 5\' 11"  (1.803 m)    Wt 214 lb 3.2 oz (97.2 kg)    SpO2 99%    BMI 29.87 kg/m    Review of Systems He has HB    Objective:   Physical Exam    A1c=6.8%    Assessment & Plan:  Insulin-requiring type 2 DM: overcontrolled.   Patient Instructions  Please continue the same Trulicity, and reduce the insulin to 26 units with breakfast. On this type of insulin schedule, you should eat meals on a regular schedule (especially breakfast and lunch).  If a meal is missed or significantly delayed, your blood sugar could go low.   If you run out of insulin, you should buy a bottle of 70/30 insulin at [facility].  This is not the same as what you are on, but it is close.  You should ask a friend to draw up 1 week at a time.   Please come back for a follow-up appointment in 2 months.  I have sent a prescription to a different pharmacy, for the sensors, to try to get it covered under part B check your blood sugar twice a day.  vary the time of day when you check, between before the 3 meals, and at  bedtime.  also check if you have symptoms of your blood sugar being too high or too low.  please keep a record of the readings and bring it to your next appointment here (or you can bring the meter itself).  You can write it on any piece of paper.  please call us sooner if your blood sugar goes below 70, or if you have a lot of readings over 200.

## 2021-11-03 NOTE — Progress Notes (Signed)
[facility] PREP Weekly Session  Patient Details  Name: [name] MRN: [id number] Date of Birth: 1951-09-09 Age: 70 y.o. PCP: Binnie Rail, MD  Vitals:   11/03/21 1319  Weight: 214 lb (97.1 kg)     [facility] Weekly seesion - 11/03/21 1300       [facility] "PREP" Location   [facility] "PREP" Location [facility]      Weekly Session   Topic Discussed Finding support   Review of several food labels, both nutrition and ingredients.   Minutes exercised this week 240 minutes    Classes attended to date [place] 11/03/2021, 1:20 PM

## 2021-11-05 ENCOUNTER — Encounter: Payer: Self-pay | Admitting: Internal Medicine

## 2021-11-05 NOTE — Progress Notes (Signed)
Subjective:    Patient ID: [name], male    DOB: 1951/01/12, 70 y.o.   MRN: [id number]  This visit occurred during the SARS-CoV-2 public health emergency.  Safety protocols were in place, including screening questions prior to the visit, additional usage of staff PPE, and extensive cleaning of exam room while observing appropriate contact time as indicated for disinfecting solutions.     HPI The patient is here for follow up of their chronic medical problems, including htn, DM, hld, h/o cva w/ residual left sided weakness, CAD, CKD   Saw urology last week and did mention increased urination.  They did a UA and they advised him he did have an infection.  Initially they prescribed penicillin, which she is allergic to and then had prescribed Cipro.  He looked up side effects and did not want to take the medication.  He did not call them back, but wanted to have me prescribe something different.  He still states some urinary frequency.  Cold symptoms started x 2 weeks that is not getting better.  He states congestion and cough with discolored mucus, wheezing, some sinus pressure, sore throat and Lightheadedness.  Medications and allergies reviewed with patient and updated if appropriate.  Patient Active Problem List   Diagnosis Date Noted   Coronary artery disease 03/25/2021   Hoarse 03/25/2021   Major depressive disorder, recurrent episode, moderate ([place]) 03/25/2021   Major depressive disorder, single episode 03/25/2021   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   Observation and evaluation for other specified suspected conditions 03/25/2021   Vocal cord paralysis 03/25/2021   Diabetic peripheral neuropathy ([place]) 12/25/2020   Orthopnea 12/16/2020   OSA on CPAP 09/09/2020   Unstable angina ([place]) 07/09/2020   Sciatica of left side 07/11/2019   Cough 07/11/2019   Edema leg 10/26/2018   History of stroke 10/26/2018   DOE (dyspnea on exertion) 10/26/2018   Left hip pain  07/14/2018   Acute left ankle pain 07/14/2018   Chest pain 07/14/2018   Nonintractable headache 02/24/2018   CRI (chronic renal insufficiency), stage 3 (moderate) 02/22/2017   Anemia 02/16/2017   Chronic combined systolic and diastolic heart failure ([place]) 06/29/2016   LBBB (left bundle branch block) 02/02/2016   Nonallopathic lesion of lumbosacral region 12/11/2014   Nonallopathic lesion of sacral region 12/11/2014   Nonallopathic lesion of thoracic region 12/11/2014   Arthritis of left hip 11/20/2014   Ischial bursitis of left side 10/28/2014   Hamstring tightness of left lower extremity 09/16/2014   Piriformis syndrome of left side 09/16/2014   Dysphonia 06/22/2013   Hemiparesis affecting left side as late effect of stroke ([place]) 10/10/2012   Dysphagia following cerebrovascular accident 08/14/2012   Chronic back pain    Peripheral neuropathy ([place])    TIA (transient ischemic attack) 04/08/2012   Stroke ([place]) 02/21/2012   MORTON'S NEUROMA, RIGHT 05/29/2010   Diabetes ([place]) 04/13/2010   ALLERGIC RHINITIS 04/13/2010   GERD 04/13/2010   ERECTILE DYSFUNCTION 03/12/2009   Essential hypertension 03/12/2009   CAD S/P percutaneous coronary angioplasty 03/12/2009   Hyperlipidemia 03/07/2009    Current Outpatient Medications on File Prior to Visit  Medication Sig Dispense Refill   amLODipine (NORVASC) 5 MG tablet Take 5 mg by mouth daily.     aspirin EC 81 MG tablet Take 1 tablet (81 mg total) by mouth daily. Swallow whole. 30 tablet 11   atorvastatin (LIPITOR) 40 MG tablet Take 1 tablet (40 mg total) by mouth daily. 90 tablet 3  Blood Pressure Monitoring (BLOOD PRESSURE CUFF) MISC 1 Units by Does not apply route daily. 1 each 0   calcium carbonate (TUMS - DOSED IN MG ELEMENTAL CALCIUM) 500 MG chewable tablet Chew 1 tablet by mouth daily as needed for indigestion or heartburn.     carvedilol (COREG) 12.5 MG tablet TAKE 1 AND 1/2 TABLETS TWICE DAILY 270 tablet 2   cetirizine (ZYRTEC) 10  MG tablet Take 1 tablet by mouth daily as needed.     chlorthalidone (HYGROTON) 25 MG tablet Take 1 tablet (25 mg total) by mouth daily. 90 tablet 3   clopidogrel (PLAVIX) 75 MG tablet Take 1 tablet (75 mg total) by mouth daily. Please schedule annual appt in April for refills. Thank you 90 tablet 3   Continuous Blood Gluc Sensor (FREESTYLE LIBRE 2 SENSOR) MISC 1 Device by Does not apply route every 14 (fourteen) days. 6 each 3   Dulaglutide (TRULICITY) 7.67 MC/9.4BS SOPN Inject 0.5 mLs (0.75 mg total) into the skin once a week. 6 mL 3   furosemide (LASIX) 40 MG tablet TAKE 1 TABLET (40 MG TOTAL) BY MOUTH DAILY. NEED APPT. 90 tablet 3   glucose blood (ONETOUCH VERIO) test strip 1 each by Other route 2 (two) times daily. And lancets 2/day 180 each 3   Insulin Lispro Prot & Lispro (HUMALOG MIX 50/50 KWIKPEN) (50-50) 100 UNIT/ML Kwikpen Inject 26 Units into the skin daily with breakfast. 45 mL 1   losartan (COZAAR) 100 MG tablet TAKE 1 TABLET EVERY DAY 90 tablet 3   OneTouch Delica Lancets 96G MISC 1 each by Does not apply route 2 (two) times daily. Use to monitor glucose levels BID; E11.9 100 each 3   RELION PEN NEEDLES 31G X 6 MM MISC USE 1 PEN NEEDLE ONCE DAILY 50 each 0   senna-docusate (SENOKOT-S) 8.6-50 MG per tablet Take 2 tablets by mouth 2 (two) times daily. For constipation.     nitroGLYCERIN (NITROSTAT) 0.4 MG SL tablet Place 1 tablet (0.4 mg total) under the tongue every 5 (five) minutes as needed for chest pain (up to 3 doses). 25 tablet 3   No current facility-administered medications on file prior to visit.    Past Medical History:  Diagnosis Date   ALLERGIC RHINITIS    CAD, NATIVE VESSEL    BMS to [place] 2001, DES to BMS 2005   Chronic back pain    herniated disc   Chronic combined systolic and diastolic heart failure ([place]) 06/29/2016   Constipation    takes Carafate four times day   DIAB W/UNSPEC COMP TYPE II/UNSPEC TYPE UNCNTRL    ERECTILE DYSFUNCTION    GERD     HYPERLIPIDEMIA-MIXED    HYPERTENSION, BENIGN    LBBB (left bundle branch block) 02/02/2016   MORTON'S NEUROMA, RIGHT    OSA on CPAP 09/09/2020   Peripheral neuropathy    Seasonal allergies    takes Allegra and Benadryl daily prn;uses Flonase daily   SHOULDER PAIN, RIGHT    Snoring 09/09/2020   Stroke, 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   L HP + hemiparesis, s/p CIR    TIA on medication 02/2012   Unstable angina ([place]) 07/09/2020    Past Surgical History:  Procedure Laterality Date   ANGIOPLASTY     COLONOSCOPY     CORONARY ANGIOPLASTY  2005   2 stents   CORONARY STENT INTERVENTION N/A 07/10/2020   Procedure: CORONARY STENT INTERVENTION;  Surgeon: Martinique, Peter M, MD;  Location: [facility];  Service:  Cardiovascular;  Laterality: N/A;   DENTAL SURGERY     LARYNGOPLASTY  08/07/2012   Procedure: LARYNGOPLASTY;  Surgeon: Izora Gala, MD;  Location: [facility];  Service: ENT;  Laterality: Left;  Left Vocal Cord Medialyzation   LEFT HEART CATH AND CORONARY ANGIOGRAPHY N/A 07/10/2020   Procedure: LEFT HEART CATH AND CORONARY ANGIOGRAPHY;  Surgeon: Martinique, Peter M, MD;  Location: [facility];  Service: Cardiovascular;  Laterality: N/A;   left knee surgury     x 2    stent  2001, 2004   coronary stents    Social History   Socioeconomic History   Marital status: Legally Separated    Spouse name: Not on file   Number of children: 3   Years of education: college   Highest education level: Not on file  Occupational History   Occupation: disabled    Employer: [employer]  Tobacco Use   Smoking status: Former    Packs/day: 2.50    Years: 9.00    Pack years: 22.50    Types: Cigarettes    Quit date: 02/24/1983    Years since quitting: 38.7   Smokeless tobacco: Never  Vaping Use   Vaping Use: Never used  Substance and Sexual Activity   Alcohol use: No    Alcohol/week: 0.0 standard drinks   Drug use: No   Sexual activity: Never  Other Topics Concern   Not on file  Social History  Narrative      Social Determinants of Health   Financial Resource Strain: Low Risk    Difficulty of Paying Living Expenses: Not hard at all  Food Insecurity: No Food Insecurity   Worried About Charity fundraiser in the Last Year: Never true   [place] in the Last Year: Never true  Transportation Needs: No Transportation Needs   Lack of Transportation (Medical): No   Lack of Transportation (Non-Medical): No  Physical Activity: Inactive   Days of Exercise per Week: 0 days   Minutes of Exercise per Session: 0 min  Stress: No Stress Concern Present   Feeling of Stress : Not at all  Social Connections: Moderately Integrated   Frequency of Communication with Friends and Family: More than three times a week   Frequency of Social Gatherings with Friends and Family: More than three times a week   Attends Religious Services: 1 to 4 times per year   Active Member of [employer] or Organizations: No   Attends Music therapist: 1 to 4 times per year   Marital Status: Never married    Family History  Problem Relation Age of Onset   Cancer Mother     Review of Systems  Constitutional:  Positive for diaphoresis (occ). Negative for fever.  HENT:  Positive for congestion (greenish phlegm), sinus pressure and sore throat (on right side). Negative for ear discharge (left ear pressure) and rhinorrhea.   Respiratory:  Positive for cough (greenish phlegm) and wheezing (x few days). Negative for shortness of breath.   Cardiovascular:  Positive for chest pain (several times - sharp pain in central chest x 5 min). Negative for palpitations and leg swelling.  Gastrointestinal:  Negative for abdominal pain and nausea (sometimes).  Genitourinary:  Positive for difficulty urinating (sometimes), frequency (not as bad as last week) and urgency (sometimes). Negative for dysuria and hematuria.  Musculoskeletal:  Positive for back pain (left lower back - chronic).  Neurological:  Positive for  light-headedness (at times). Negative for headaches.  Objective:   Vitals:   11/06/21 0855  BP: 110/60  Pulse: 80  Temp: 97.9 F (36.6 C)  SpO2: 99%   BP Readings from Last 3 Encounters:  11/06/21 110/60  11/03/21 (!) 100/50  09/03/21 114/60   Wt Readings from Last 3 Encounters:  11/06/21 215 lb (97.5 kg)  11/03/21 214 lb (97.1 kg)  11/03/21 214 lb 3.2 oz (97.2 kg)   Body mass index is 29.99 kg/m.   Physical Exam    GENERAL APPEARANCE: Appears stated age, well appearing, NAD EYES: conjunctiva clear, no icterus HENT: bilateral tympanic membranes and ear canals normal, oropharynx with no erythema or exudates, trachea midline, no cervical or supraclavicular lymphadenopathy LUNGS: Unlabored breathing, good air entry bilaterally, clear to auscultation without wheeze or crackles CARDIOVASCULAR: Normal S1,S2 , no edema SKIN: Warm, dry     Assessment & Plan:    See Problem List for Assessment and Plan of chronic medical problems.

## 2021-11-06 ENCOUNTER — Ambulatory Visit (INDEPENDENT_AMBULATORY_CARE_PROVIDER_SITE_OTHER): Payer: Medicare HMO | Admitting: Internal Medicine

## 2021-11-06 ENCOUNTER — Other Ambulatory Visit: Payer: Self-pay

## 2021-11-06 VITALS — BP 110/60 | HR 80 | Temp 97.9°F | Ht 71.0 in | Wt 215.0 lb

## 2021-11-06 DIAGNOSIS — J0120 Acute ethmoidal sinusitis, unspecified: Secondary | ICD-10-CM | POA: Diagnosis not present

## 2021-11-06 DIAGNOSIS — R300 Dysuria: Secondary | ICD-10-CM

## 2021-11-06 DIAGNOSIS — R350 Frequency of micturition: Secondary | ICD-10-CM | POA: Insufficient documentation

## 2021-11-06 LAB — POC URINALSYSI DIPSTICK (AUTOMATED)
Bilirubin, UA: NEGATIVE
Glucose, UA: NEGATIVE
Ketones, UA: NEGATIVE
Leukocytes, UA: NEGATIVE
Nitrite, UA: NEGATIVE
Protein, UA: POSITIVE — AB
Spec Grav, UA: 1.025 (ref 1.010–1.025)
Urobilinogen, UA: 0.2 E.U./dL
pH, UA: 5.5 (ref 5.0–8.0)

## 2021-11-06 MED ORDER — DOXYCYCLINE HYCLATE 100 MG PO TABS: 100.0000 mg | ORAL_TABLET | Freq: Two times a day (BID) | ORAL | 0 refills | Status: AC

## 2021-11-06 NOTE — Assessment & Plan Note (Signed)
Acute Saw urology last week and was told that he had an infection-I am not sure if this was UTI or prostatitis.  He was prescribed Cipro, but does not want to take that because of concerns with possible side effects Urine dip here without obvious infection-we will send for culture and then determine if treatment is needed I will be starting him on doxycycline for sinus infection, which may also help if he does have a UTI

## 2021-11-06 NOTE — Assessment & Plan Note (Signed)
Acute ?Likely bacterial  ?Start doxycycline 100 mg BID x 10 day ?otc cold medications ?Rest, fluid ?Call if no improvement ? ?

## 2021-11-06 NOTE — Patient Instructions (Signed)
° ° °  Medications changes include :   doxycycline 100 mg twice daily for 10 days  Your prescription(s) have been submitted to your pharmacy. Please take as directed and contact our office if you believe you are having problem(s) with the medication(s).    Please call if there is no improvement in your symptoms.

## 2021-11-10 ENCOUNTER — Telehealth: Payer: Self-pay

## 2021-11-10 LAB — CULTURE, URINE COMPREHENSIVE

## 2021-11-10 NOTE — Progress Notes (Signed)
Chronic Care Management Pharmacy Assistant   Name: [name] MRN: [id number] DOB: [birth year]  Reason for Encounter: Disease State - Diabetes Appointment: 01/25/22 @ 10 am   Recent office visits:  11/06/21 Burns (PCP) - Urinary frequency. Start Doxycycline Hyclate 100 mg.  Recent consult visits:  11/03/21 Loanne Drilling (Endocrinology) - Type 2 diabetes mellitus without complication, with long-term current use of insulin. Decrease Insulin Lispro to 26 units.   10/23/21 [place] ([facility]) - Coronary artery disease. No med changes.  09/30/21 Mayer (Podiatry) - Pain due to onychomycosis of toenails of both feet. No med changes.  09/01/21 Loanne Drilling (Endocrinology) - Type 2 diabetes mellitus without complication, with long-term current use of insulin. Decrease Insulin Lispro to 28 units. Referral to Diabetic Education.  08/18/21 Loanne Drilling (Endocrinology) - Type 2 diabetes mellitus without complication, with long-term current use of insulin. No med changes.  08/03/21 Oval Linsey (Cardiology) - DOE (dyspnea on exertion). Take Lasix 40 mg prn.  06/29/21 Prudence Davidson (Podiatry) - Diabetic peripheral neuropathy associated with type 2 diabetes mellitus. No med changes.   Hospital visits:  None in previous 6 months  Medications: Outpatient Encounter Medications as of 11/10/2021  Medication Sig   amLODipine (NORVASC) 5 MG tablet Take 5 mg by mouth daily.   aspirin EC 81 MG tablet Take 1 tablet (81 mg total) by mouth daily. Swallow whole.   atorvastatin (LIPITOR) 40 MG tablet Take 1 tablet (40 mg total) by mouth daily.   Blood Pressure Monitoring (BLOOD PRESSURE CUFF) MISC 1 Units by Does not apply route daily.   calcium carbonate (TUMS - DOSED IN MG ELEMENTAL CALCIUM) 500 MG chewable tablet Chew 1 tablet by mouth daily as needed for indigestion or heartburn.   carvedilol (COREG) 12.5 MG tablet TAKE 1 AND 1/2 TABLETS TWICE DAILY   cetirizine (ZYRTEC) 10 MG tablet Take 1 tablet by mouth daily as  needed.   chlorthalidone (HYGROTON) 25 MG tablet Take 1 tablet (25 mg total) by mouth daily.   clopidogrel (PLAVIX) 75 MG tablet Take 1 tablet (75 mg total) by mouth daily. Please schedule annual appt in April for refills. Thank you   Continuous Blood Gluc Sensor (FREESTYLE LIBRE 2 SENSOR) MISC 1 Device by Does not apply route every 14 (fourteen) days.   doxycycline (VIBRA-TABS) 100 MG tablet Take 1 tablet (100 mg total) by mouth 2 (two) times daily for 10 days.   Dulaglutide (TRULICITY) 6.75 FF/6.3WG SOPN Inject 0.5 mLs (0.75 mg total) into the skin once a week.   furosemide (LASIX) 40 MG tablet TAKE 1 TABLET (40 MG TOTAL) BY MOUTH DAILY. NEED APPT.   glucose blood (ONETOUCH VERIO) test strip 1 each by Other route 2 (two) times daily. And lancets 2/day   Insulin Lispro Prot & Lispro (HUMALOG MIX 50/50 KWIKPEN) (50-50) 100 UNIT/ML Kwikpen Inject 26 Units into the skin daily with breakfast.   losartan (COZAAR) 100 MG tablet TAKE 1 TABLET EVERY DAY   nitroGLYCERIN (NITROSTAT) 0.4 MG SL tablet Place 1 tablet (0.4 mg total) under the tongue every 5 (five) minutes as needed for chest pain (up to 3 doses).   OneTouch Delica Lancets 66Z MISC 1 each by Does not apply route 2 (two) times daily. Use to monitor glucose levels BID; E11.9   RELION PEN NEEDLES 31G X 6 MM MISC USE 1 PEN NEEDLE ONCE DAILY   senna-docusate (SENOKOT-S) 8.6-50 MG per tablet Take 2 tablets by mouth 2 (two) times daily. For constipation.   No facility-administered encounter medications on file  as of 11/10/2021.   Recent Relevant Labs: Lab Results  Component Value Date/Time   HGBA1C 6.8 (A) 11/03/2021 10:44 AM   HGBA1C 6.6 (A) 08/18/2021 01:12 PM   HGBA1C 7.7 (H) 08/28/2018 10:12 AM   HGBA1C 9.3 (H) 02/24/2018 11:09 AM   MICROALBUR 7.5 (H) 08/17/2016 10:30 AM   MICROALBUR 9.6 (H) 08/07/2015 11:17 AM    Kidney Function Lab Results  Component Value Date/Time   CREATININE 2.29 (H) 09/10/2020 02:37 PM   CREATININE 1.88 (H)  08/15/2020 01:12 PM   GFR 41.31 (L) 04/15/2020 11:28 AM   GFRNONAA 28 (L) 09/10/2020 02:37 PM   GFRAA 32 (L) 09/10/2020 02:37 PM    Current antihyperglycemic regimen:  Trulicity 1.60 mg once weekly  Humalog mix 50/50 32 units with breakfast  What recent interventions/DTPs have been made to improve glycemic control:  Patient is to continue the same Trulicity, and reduce the insulin to 26 units with breakfast.  Have there been any recent hospitalizations or ED visits since last visit with CPP? No  Patient denies hypoglycemic symptoms, including None  Patient denies hyperglycemic symptoms, including none  How often are you checking your blood sugar?  Patient states he is currently not checking his glucose, his Endo provider ordered him a one touch meter and he is waiting on it to come in.  What are your blood sugars ranging?  Patient states no readings to report.  During the week, how often does your blood glucose drop below 70?  Never  Are you checking your feet daily/regularly?  Patient states he checks his feet after his showers.  Adherence Review: Is the patient currently on a STATIN medication? Yes Is the patient currently on ACE/ARB medication? Yes Does the patient have >5 day gap between last estimated fill dates? No   Care Gaps Colonoscopy - 01/02/2021 Diabetic Foot Exam - 11/9/22022 Mammogram - NA Ophthalmology - 09/23/2017 Dexa Scan - NA Annual Well Visit - 01/28/2021 Micro albumin - NA Hemoglobin A1c - 11/03/21   Star Rating Drugs: Atorvastatin - filled 11/30/30 35T Trulicity - filled 7/32/20 84D (Gets through Patient Assistant) Losartan - fill 09/03/2020 [place], [place] Clinical Pharmacists Assistant [phone]

## 2021-11-10 NOTE — Progress Notes (Signed)
[facility] PREP Weekly Session  Patient Details  Name: [name] MRN: [id number] Date of Birth: 12-10-1950 Age: 70 y.o. PCP: Binnie Rail, MD  Vitals:   11/10/21 1337  Weight: 212 lb 4.8 oz (96.3 kg)     [facility] Weekly seesion - 11/10/21 1300       [facility] "PREP" Location   [facility] "PREP" Location [facility]      Weekly Session   Topic Discussed Calorie breakdown   Membership talk/options with Y staff   Minutes exercised this week 240 minutes    Classes attended to date [place] 11/10/2021, 1:38 PM

## 2021-11-11 ENCOUNTER — Other Ambulatory Visit: Payer: Self-pay | Admitting: Cardiovascular Disease

## 2021-11-20 ENCOUNTER — Other Ambulatory Visit: Payer: Self-pay | Admitting: Endocrinology

## 2021-11-24 NOTE — Progress Notes (Signed)
[facility] PREP Weekly Session  Patient Details  Name: [name] MRN: [id number] Date of Birth: 06-Mar-1951 Age: 71 y.o. PCP: Binnie Rail, MD  Vitals:   11/24/21 1318  Weight: 212 lb (96.2 kg)     [facility] Weekly seesion - 11/24/21 1300       [facility] "PREP" Location   [facility] "PREP" Location [facility]      Weekly Session   Topic Discussed Hitting roadblocks   Reviewed Goals and Activity plan and PREP survery for final assessment visit next week   Minutes exercised this week 300 minutes    Classes attended to date [place] 11/24/2021, 1:19 PM

## 2021-11-26 ENCOUNTER — Telehealth: Payer: Self-pay | Admitting: Dietician

## 2021-11-26 ENCOUNTER — Telehealth: Payer: Self-pay

## 2021-11-26 NOTE — Progress Notes (Signed)
Chronic Care Management Pharmacy Assistant   Name: [name]  MRN: [id number] DOB: [birth year]  Reason for Encounter: Disease State - Diabetes   Recent office visits:  None  Recent consult visits:  None  Hospital visits:  None in previous 6 months  Medications: Outpatient Encounter Medications as of 11/26/2021  Medication Sig   amLODipine (NORVASC) 5 MG tablet Take 5 mg by mouth daily.   aspirin EC 81 MG tablet Take 1 tablet (81 mg total) by mouth daily. Swallow whole.   atorvastatin (LIPITOR) 40 MG tablet TAKE 1 TABLET EVERY DAY   Blood Pressure Monitoring (BLOOD PRESSURE CUFF) MISC 1 Units by Does not apply route daily.   calcium carbonate (TUMS - DOSED IN MG ELEMENTAL CALCIUM) 500 MG chewable tablet Chew 1 tablet by mouth daily as needed for indigestion or heartburn.   carvedilol (COREG) 12.5 MG tablet TAKE 1 AND 1/2 TABLETS TWICE DAILY   cetirizine (ZYRTEC) 10 MG tablet Take 1 tablet by mouth daily as needed.   chlorthalidone (HYGROTON) 25 MG tablet Take 1 tablet (25 mg total) by mouth daily.   clopidogrel (PLAVIX) 75 MG tablet Take 1 tablet (75 mg total) by mouth daily. Please schedule annual appt in April for refills. Thank you   Continuous Blood Gluc Sensor (FREESTYLE LIBRE 2 SENSOR) MISC 1 Device by Does not apply route every 14 (fourteen) days.   Dulaglutide (TRULICITY) 2.63 ZC/5.8IF SOPN Inject 0.5 mLs (0.75 mg total) into the skin once a week.   furosemide (LASIX) 40 MG tablet TAKE 1 TABLET (40 MG TOTAL) BY MOUTH DAILY. NEED APPT.   glucose blood (ONETOUCH VERIO) test strip 1 each by Other route 2 (two) times daily. And lancets 2/day   Insulin Lispro Prot & Lispro (HUMALOG MIX 50/50 KWIKPEN) (50-50) 100 UNIT/ML Kwikpen Inject 26 Units into the skin daily with breakfast.   losartan (COZAAR) 100 MG tablet TAKE 1 TABLET EVERY DAY   nitroGLYCERIN (NITROSTAT) 0.4 MG SL tablet Place 1 tablet (0.4 mg total) under the tongue every 5 (five) minutes as needed for chest  pain (up to 3 doses).   OneTouch Delica Lancets 02D MISC 1 each by Does not apply route 2 (two) times daily. Use to monitor glucose levels BID; E11.9   RELION PEN NEEDLES 31G X 6 MM MISC USE 1  ONCE DAILY   senna-docusate (SENOKOT-S) 8.6-50 MG per tablet Take 2 tablets by mouth 2 (two) times daily. For constipation.   No facility-administered encounter medications on file as of 11/26/2021.   Recent Relevant Labs: Lab Results  Component Value Date/Time   HGBA1C 6.8 (A) 11/03/2021 10:44 AM   HGBA1C 6.6 (A) 08/18/2021 01:12 PM   HGBA1C 7.7 (H) 08/28/2018 10:12 AM   HGBA1C 9.3 (H) 02/24/2018 11:09 AM   MICROALBUR 7.5 (H) 08/17/2016 10:30 AM   MICROALBUR 9.6 (H) 08/07/2015 11:17 AM    Kidney Function Lab Results  Component Value Date/Time   CREATININE 2.29 (H) 09/10/2020 02:37 PM   CREATININE 1.88 (H) 08/15/2020 01:12 PM   GFR 41.31 (L) 04/15/2020 11:28 AM   GFRNONAA 28 (L) 09/10/2020 02:37 PM   GFRAA 32 (L) 09/10/2020 02:37 PM    Current antihyperglycemic regimen:  Trulicity 7.41 mg once weekly  Humalog mix 50/50 32 units with breakfast  What recent interventions/DTPs have been made to improve glycemic control:  None noted  Have there been any recent hospitalizations or ED visits since last visit with CPP? No  Patient denies hypoglycemic symptoms, including None  Patient denies hyperglycemic symptoms, including none  How often are you checking your blood sugar?  Patient states he is not checking his glucose at this time, he states he's waiting on the approval of a new meter and has not heard back from since registering with the supplier.  What are your blood sugars ranging?  No readings to report.  During the week, how often does your blood glucose drop below 70? Never Are you checking your feet daily/regularly?   Adherence Review: Is the patient currently on a STATIN medication? Yes Is the patient currently on ACE/ARB medication? Yes Does the patient have >5 day gap  between last estimated fill dates? No  Care Gaps Colonoscopy - 01/02/2021 Diabetic Foot Exam - 11/9/22022 Mammogram - NA Ophthalmology - 09/23/2017 Dexa Scan - NA Annual Well Visit - 01/28/2021 Micro albumin - NA Hemoglobin A1c - 11/03/21  Star Rating Drugs: Atorvastatin - filled 05/15/75 28B Trulicity - filled 1/51/76 84D (Gets through Patient Assistant) Losartan - fill 09/03/2020 [place], [place] Clinical Pharmacists Assistant [phone]

## 2021-11-26 NOTE — Telephone Encounter (Signed)
Patient called and left a message that he still has not heard anything regarding the "no touch meter" that was prescribed 2 1/2 weeks ago.  He states that it has been resubmitted under Medicare part B.  Chart reviewed.  Prescription for the [facility] has been sent to [facility] who stated that patient needed to call them to set up an account and provide his insurance information.  He is to call [phone].    Called patient was not available.  Voice mailbox is full and was unable to leave a message.  Antonieta Iba, RD, LDN, CDCES

## 2021-11-27 ENCOUNTER — Telehealth: Payer: Self-pay | Admitting: Dietician

## 2021-11-27 NOTE — Telephone Encounter (Signed)
Called patient and instructed him to call [facility] [phone]) to make an account so that his FreeStyle Elenor Legato can be processed. Patient verbalized understanding and is to call back for an appointment for training when he gets the device.  Antonieta Iba, RD, LDN, CDCES

## 2021-12-01 NOTE — Progress Notes (Signed)
[facility] PREP Weekly Session  Patient Details  Name: Tait Balistreri MRN: [id number] Date of Birth: [birth year] Age: 71 y.o. PCP: Binnie Rail, MD  Vitals:   12/01/21 1331  Weight: 212 lb 8 oz (96.4 kg)     [facility] Weekly seesion - 12/01/21 1300       [facility] "PREP" Location   [facility] "PREP" Location [facility]      Weekly Session   Topic Discussed Other   End of program fit testing completed; final assessment scheduled for Thursday   Minutes exercised this week 345 minutes    Classes attended to date 55             [place] 12/01/2021, 1:34 PM

## 2021-12-03 ENCOUNTER — Encounter (INDEPENDENT_AMBULATORY_CARE_PROVIDER_SITE_OTHER): Payer: Medicare HMO | Admitting: Ophthalmology

## 2021-12-03 NOTE — Progress Notes (Signed)
[facility] PREP Evaluation  Patient Details  Name: [name] MRN: [id number] Date of Birth: Aug 08, 1951 Age: 71 y.o. PCP: Binnie Rail, MD  Vitals:   12/03/21 1257  BP: 118/64  Pulse: 86  SpO2: 99%  Weight: 215 lb 12.8 oz (97.9 kg)     [facility] Eval - 12/03/21 1200       [facility] "PREP" Location   [facility] "PREP" Location [place]      Referral    Program Start Date 12/03/21   Prep Class Final assessment     Measurement   Neck measurement 15.5 Inches    Waist Circumference 42.5 inches    Body fat 32.7 percent      Mobility and Daily Activities   I find it easy to walk up or down two or more flights of stairs. 2    I have no trouble taking out the trash. 4    I do housework such as vacuuming and dusting on my own without difficulty. 2    I can easily lift a gallon of milk (8lbs). 4    I can easily walk a mile. 1    I have no trouble reaching into high cupboards or reaching down to pick up something from the floor. 2    I do not have trouble doing out-door work such as Armed forces logistics/support/administrative officer, raking leaves, or gardening. 1      Mobility and Daily Activities   I feel younger than my age. 4    I feel independent. 4    I feel energetic. 2    I live an active life.  4    I feel strong. 2    I feel healthy. 2    I feel active as other people my age. 2      How fit and strong are you.   Fit and Strong Total Score 36            Past Medical History:  Diagnosis Date   ALLERGIC RHINITIS    CAD, NATIVE VESSEL    BMS to [place], DES to BMS 2005   Chronic back pain    herniated disc   Chronic combined systolic and diastolic heart failure ([place]) 06/29/2016   Constipation    takes Carafate four times day   DIAB W/UNSPEC COMP TYPE II/UNSPEC TYPE UNCNTRL    ERECTILE DYSFUNCTION    GERD    HYPERLIPIDEMIA-MIXED    HYPERTENSION, BENIGN    LBBB (left bundle branch block) 02/02/2016   MORTON'S NEUROMA, RIGHT    OSA on CPAP 09/09/2020   Peripheral neuropathy    Seasonal  allergies    takes Allegra and Benadryl daily prn;uses Flonase daily   SHOULDER PAIN, RIGHT    Snoring 09/09/2020   Stroke, 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, s/p CIR    TIA on medication 02/2012   Unstable angina ([place]) 07/09/2020   Past Surgical History:  Procedure Laterality Date   ANGIOPLASTY     COLONOSCOPY     CORONARY ANGIOPLASTY  2005   2 stents   CORONARY STENT INTERVENTION N/A 07/10/2020   Procedure: CORONARY STENT INTERVENTION;  Surgeon: Martinique, Peter M, MD;  Location: [place] [facility];  Service: Cardiovascular;  Laterality: N/A;   DENTAL SURGERY     LARYNGOPLASTY  08/07/2012   Procedure: LARYNGOPLASTY;  Surgeon: Izora Gala, MD;  Location: [facility];  Service: ENT;  Laterality: Left;  Left Vocal Cord Medialyzation   LEFT  HEART CATH AND CORONARY ANGIOGRAPHY N/A 07/10/2020   Procedure: LEFT HEART CATH AND CORONARY ANGIOGRAPHY;  Surgeon: Martinique, Peter M, MD;  Location: [facility];  Service: Cardiovascular;  Laterality: N/A;   left knee surgury     x 2    stent  2001, 2004   coronary stents   Social History   Tobacco Use  Smoking Status Former   Packs/day: 2.50   Years: 9.00   Pack years: 22.50   Types: Cigarettes   Quit date: 02/24/1983   Years since quitting: 38.8  Smokeless Tobacco Never  How fit and strong survey score 27 on 09/08/2021, up to 36 on 12/03/21 Education sessions attended: 11 Workout sessions attended: [place] 12/03/2021, 12:59 PM

## 2021-12-08 ENCOUNTER — Emergency Department (HOSPITAL_BASED_OUTPATIENT_CLINIC_OR_DEPARTMENT_OTHER): Payer: Medicare HMO | Admitting: Radiology

## 2021-12-08 ENCOUNTER — Other Ambulatory Visit: Payer: Self-pay

## 2021-12-08 ENCOUNTER — Ambulatory Visit (HOSPITAL_BASED_OUTPATIENT_CLINIC_OR_DEPARTMENT_OTHER): Payer: Medicare HMO | Admitting: Family

## 2021-12-08 ENCOUNTER — Encounter (HOSPITAL_BASED_OUTPATIENT_CLINIC_OR_DEPARTMENT_OTHER): Payer: Self-pay

## 2021-12-08 ENCOUNTER — Emergency Department (HOSPITAL_BASED_OUTPATIENT_CLINIC_OR_DEPARTMENT_OTHER)
Admission: EM | Admit: 2021-12-08 | Discharge: 2021-12-08 | Disposition: A | Discharge status: Home / Self Care | Payer: Medicare HMO | Attending: Emergency Medicine | Admitting: Emergency Medicine

## 2021-12-08 ENCOUNTER — Other Ambulatory Visit (HOSPITAL_BASED_OUTPATIENT_CLINIC_OR_DEPARTMENT_OTHER): Payer: Self-pay

## 2021-12-08 ENCOUNTER — Telehealth: Payer: Self-pay | Admitting: Cardiovascular Disease

## 2021-12-08 DIAGNOSIS — I2510 Atherosclerotic heart disease of native coronary artery without angina pectoris: Secondary | ICD-10-CM | POA: Insufficient documentation

## 2021-12-08 DIAGNOSIS — R112 Nausea with vomiting, unspecified: Secondary | ICD-10-CM | POA: Diagnosis present

## 2021-12-08 DIAGNOSIS — R6883 Chills (without fever): Secondary | ICD-10-CM | POA: Insufficient documentation

## 2021-12-08 DIAGNOSIS — I509 Heart failure, unspecified: Secondary | ICD-10-CM | POA: Diagnosis not present

## 2021-12-08 DIAGNOSIS — R079 Chest pain, unspecified: Secondary | ICD-10-CM | POA: Diagnosis not present

## 2021-12-08 DIAGNOSIS — Z794 Long term (current) use of insulin: Secondary | ICD-10-CM | POA: Insufficient documentation

## 2021-12-08 DIAGNOSIS — I130 Hypertensive heart and chronic kidney disease with heart failure and stage 1 through stage 4 chronic kidney disease, or unspecified chronic kidney disease: Secondary | ICD-10-CM | POA: Diagnosis not present

## 2021-12-08 DIAGNOSIS — E119 Type 2 diabetes mellitus without complications: Secondary | ICD-10-CM | POA: Diagnosis not present

## 2021-12-08 DIAGNOSIS — Z79899 Other long term (current) drug therapy: Secondary | ICD-10-CM | POA: Insufficient documentation

## 2021-12-08 DIAGNOSIS — R61 Generalized hyperhidrosis: Secondary | ICD-10-CM | POA: Insufficient documentation

## 2021-12-08 DIAGNOSIS — Z7982 Long term (current) use of aspirin: Secondary | ICD-10-CM | POA: Diagnosis not present

## 2021-12-08 DIAGNOSIS — N183 Chronic kidney disease, stage 3 unspecified: Secondary | ICD-10-CM | POA: Insufficient documentation

## 2021-12-08 DIAGNOSIS — Z20822 Contact with and (suspected) exposure to covid-19: Secondary | ICD-10-CM | POA: Insufficient documentation

## 2021-12-08 LAB — CBC
HCT: 34.7 % — ABNORMAL LOW (ref 39.0–52.0)
Hemoglobin: 11.3 g/dL — ABNORMAL LOW (ref 13.0–17.0)
MCH: 30.5 pg (ref 26.0–34.0)
MCHC: 32.6 g/dL (ref 30.0–36.0)
MCV: 93.5 fL (ref 80.0–100.0)
Platelets: 178 10*3/uL (ref 150–400)
RBC: 3.71 MIL/uL — ABNORMAL LOW (ref 4.22–5.81)
RDW: 12.4 % (ref 11.5–15.5)
WBC: 5.8 10*3/uL (ref 4.0–10.5)
nRBC: 0 % (ref 0.0–0.2)

## 2021-12-08 LAB — HEPATIC FUNCTION PANEL
ALT: 29 U/L (ref 0–44)
AST: 29 U/L (ref 15–41)
Albumin: 3.8 g/dL (ref 3.5–5.0)
Alkaline Phosphatase: 54 U/L (ref 38–126)
Bilirubin, Direct: 0.2 mg/dL (ref 0.0–0.2)
Indirect Bilirubin: 0.9 mg/dL (ref 0.3–0.9)
Total Bilirubin: 1.1 mg/dL (ref 0.3–1.2)
Total Protein: 7.4 g/dL (ref 6.5–8.1)

## 2021-12-08 LAB — BASIC METABOLIC PANEL
Anion gap: 9 (ref 5–15)
BUN: 34 mg/dL — ABNORMAL HIGH (ref 8–23)
CO2: 26 mmol/L (ref 22–32)
Calcium: 9.2 mg/dL (ref 8.9–10.3)
Chloride: 103 mmol/L (ref 98–111)
Creatinine, Ser: 2.09 mg/dL — ABNORMAL HIGH (ref 0.61–1.24)
GFR, Estimated: 33 mL/min — ABNORMAL LOW (ref >60)
Glucose, Bld: 144 mg/dL — ABNORMAL HIGH (ref 70–99)
Potassium: 3.8 mmol/L (ref 3.5–5.1)
Sodium: 138 mmol/L (ref 135–145)

## 2021-12-08 LAB — LIPASE, BLOOD: Lipase: 36 U/L (ref 11–51)

## 2021-12-08 LAB — TROPONIN I (HIGH SENSITIVITY)
Troponin I (High Sensitivity): 13 ng/L (ref <18)
Troponin I (High Sensitivity): 11 ng/L (ref <18)

## 2021-12-08 LAB — RESP PANEL BY RT-PCR (FLU A&B, COVID) ARPGX2
Influenza A by PCR: NEGATIVE
Influenza B by PCR: NEGATIVE
SARS Coronavirus 2 by RT PCR: NEGATIVE

## 2021-12-08 MED ORDER — ONDANSETRON HCL 4 MG/2ML IJ SOLN
4.0000 mg | Freq: Once | INTRAMUSCULAR | Status: AC
  Administered 2021-12-08: 4 mg via INTRAVENOUS
  Filled 2021-12-08: qty 2

## 2021-12-08 MED ORDER — ONDANSETRON HCL 4 MG PO TABS
4.0000 mg | ORAL_TABLET | Freq: Three times a day (TID) | ORAL | 0 refills | Status: DC | PRN
  Filled 2021-12-08: qty 20, 7d supply, fill #0

## 2021-12-08 MED ORDER — SODIUM CHLORIDE 0.9 % IV BOLUS
1000.0000 mL | Freq: Once | INTRAVENOUS | Status: AC
  Administered 2021-12-08: 1000 mL via INTRAVENOUS

## 2021-12-08 MED ORDER — ALUM & MAG HYDROXIDE-SIMETH 200-200-20 MG/5ML PO SUSP
30.0000 mL | Freq: Once | ORAL | Status: AC
  Administered 2021-12-08: 30 mL via ORAL
  Filled 2021-12-08: qty 30

## 2021-12-08 MED ORDER — DICYCLOMINE HCL 10 MG PO CAPS
10.0000 mg | ORAL_CAPSULE | Freq: Once | ORAL | Status: AC
  Administered 2021-12-08: 10 mg via ORAL
  Filled 2021-12-08: qty 1

## 2021-12-08 MED ORDER — LIDOCAINE VISCOUS HCL 2 % MT SOLN
15.0000 mL | Freq: Once | OROMUCOSAL | Status: AC
  Administered 2021-12-08: 15 mL via ORAL
  Filled 2021-12-08: qty 15

## 2021-12-08 NOTE — ED Notes (Signed)
Patient verbalizes understanding of discharge instructions. Opportunity for questioning and answers were provided. Patient discharged from ED.  °

## 2021-12-08 NOTE — ED Provider Notes (Addendum)
[facility] DEPT Provider Note   CSN: [id number] Arrival date & time: 12/08/21  1007     History  Chief Complaint  Patient presents with   Chest Pain    [name] is a 71 y.o. male.  PMH includes Diabetes type 2, coronary artery disease status post stents, hyperlipidemia, TIA, previous CVA with residual left hemibody paresis, hypertension, GERD, CKD stage III, CHF.  Patient presents the emergency department with complaints of nausea and vomiting since Friday.  Symptoms were pretty persistent throughout the weekend.  Vomit was nonbloody and nonbilious.  He did have some difficulty keeping food down.  On Sunday, he said he developed chest pain that was an the center of his chest.  It did not radiate.  It lasted for about 30 minutes to an hour and got better when he took Tums.  He had associated chills and diaphoresis with this.  He woke up this morning and the chest pain was gone, however he continued to have some nausea.  Vomiting had dissipated at this point.  He started developing diarrhea yesterday afternoon that is nonbloody.  He ate some chicken noodle soup last night.  He has not had any diarrhea, nausea, or vomiting today, however he feels overall weak and has not eaten anything today.  Patient denies any shortness of breath, leg swelling.  He does admit to having a new cough and sore throat that started around Friday as well.   Chest Pain Associated symptoms: cough, diaphoresis, nausea and vomiting   Associated symptoms: no abdominal pain and no shortness of breath       Home Medications Prior to Admission medications   Medication Sig Start Date End Date Taking? Authorizing Provider  amLODipine (NORVASC) 5 MG tablet Take 5 mg by mouth daily.   Yes [provider]  aspirin EC 81 MG tablet Take 1 tablet (81 mg total) by mouth daily. Swallow whole. 07/10/20  Yes Dunn, Dayna N, PA-C  atorvastatin (LIPITOR) 40 MG tablet TAKE 1 TABLET EVERY DAY  11/11/21  Yes Skeet Latch, MD  calcium carbonate (TUMS - DOSED IN MG ELEMENTAL CALCIUM) 500 MG chewable tablet Chew 1 tablet by mouth daily as needed for indigestion or heartburn.   Yes [provider]  carvedilol (COREG) 12.5 MG tablet TAKE 1 AND 1/2 TABLETS TWICE DAILY 09/17/21  Yes Skeet Latch, MD  chlorthalidone (HYGROTON) 25 MG tablet Take 1 tablet (25 mg total) by mouth daily. 01/21/21  Yes Skeet Latch, MD  clopidogrel (PLAVIX) 75 MG tablet Take 1 tablet (75 mg total) by mouth daily. Please schedule annual appt in April for refills. Thank you 01/21/21  Yes Skeet Latch, MD  Dulaglutide (TRULICITY) 3.50 KX/3.8HW SOPN Inject 0.5 mLs (0.75 mg total) into the skin once a week. 07/02/20  Yes Renato Shin, MD  Insulin Lispro Prot & Lispro (HUMALOG MIX 50/50 KWIKPEN) (50-50) 100 UNIT/ML Kwikpen Inject 26 Units into the skin daily with breakfast. 11/03/21  Yes Renato Shin, MD  losartan (COZAAR) 100 MG tablet TAKE 1 TABLET EVERY DAY 10/07/21  Yes Skeet Latch, MD  ondansetron (ZOFRAN) 4 MG tablet Take 1 tablet (4 mg total) by mouth every 8 (eight) hours as needed for nausea or vomiting. 12/08/21  Yes Logyn Dedominicis, Adora Fridge, PA-C  senna-docusate (SENOKOT-S) 8.6-50 MG per tablet Take 2 tablets by mouth 2 (two) times daily. For constipation. 09/07/12  Yes Love, Ivan Anchors, PA-C  Blood Pressure Monitoring (BLOOD PRESSURE CUFF) MISC 1 Units by Does not apply route daily.  02/03/21   Skeet Latch, MD  Continuous Blood Gluc Sensor (FREESTYLE LIBRE 2 SENSOR) MISC 1 Device by Does not apply route every 14 (fourteen) days. 11/03/21   Renato Shin, MD  furosemide (LASIX) 40 MG tablet TAKE 1 TABLET (40 MG TOTAL) BY MOUTH DAILY. NEED APPT. 09/16/21   Skeet Latch, MD  glucose blood [facility] VERIO) test strip 1 each by Other route 2 (two) times daily. And lancets 2/day 11/03/21   Renato Shin, MD  nitroGLYCERIN (NITROSTAT) 0.4 MG SL tablet Place 1 tablet (0.4 mg total) under the  tongue every 5 (five) minutes as needed for chest pain (up to 3 doses). 07/10/20 07/10/21  Dunn, Nedra Hai, PA-C  OneTouch Delica Lancets 72C MISC 1 each by Does not apply route 2 (two) times daily. Use to monitor glucose levels BID; E11.9 01/28/21   Binnie Rail, MD  RELION PEN NEEDLES 31G X 6 MM MISC USE 1  ONCE DAILY 11/20/21   Renato Shin, MD      Allergies    Penicillins, Pneumococcal vaccines, Shellfish allergy, Influenza vaccines, Lisinopril, Sildenafil, Topiramate, Amlodipine, Levitra [vardenafil], Metformin, and Tadalafil    Review of Systems   Review of Systems  Constitutional:  Positive for chills and diaphoresis.  HENT:  Positive for sore throat. Negative for congestion.   Respiratory:  Positive for cough. Negative for shortness of breath.   Cardiovascular:  Positive for chest pain. Negative for leg swelling.  Gastrointestinal:  Positive for diarrhea, nausea and vomiting. Negative for abdominal pain, blood in stool and constipation.  All other systems reviewed and are negative.  Physical Exam Updated Vital Signs BP 132/80    Pulse 73    Temp 98.3 F (36.8 C) (Oral)    Resp 17    Ht 5\' 11"  (1.803 m)    Wt 97.1 kg    SpO2 100%    BMI 29.85 kg/m  Physical Exam Vitals and nursing note reviewed.  Constitutional:      General: He is not in acute distress.    Appearance: Normal appearance. He is ill-appearing. He is not toxic-appearing or diaphoretic.     Comments: Chronically ill appearing  HENT:     Head: Normocephalic and atraumatic.     Nose: No nasal deformity.     Mouth/Throat:     Lips: Pink. No lesions.     Mouth: Mucous membranes are moist. No injury, lacerations, oral lesions or angioedema.     Pharynx: Oropharynx is clear. Uvula midline. No pharyngeal swelling, oropharyngeal exudate, posterior oropharyngeal erythema or uvula swelling.  Eyes:     General: Gaze aligned appropriately. No scleral icterus.       Right eye: No discharge.        Left eye: No discharge.      Conjunctiva/sclera: Conjunctivae normal.     Right eye: Right conjunctiva is not injected. No exudate or hemorrhage.    Left eye: Left conjunctiva is not injected. No exudate or hemorrhage.    Pupils: Pupils are equal, round, and reactive to light.  Cardiovascular:     Rate and Rhythm: Normal rate and regular rhythm.     Pulses: Normal pulses.          Radial pulses are 2+ on the right side and 2+ on the left side.       Dorsalis pedis pulses are 2+ on the right side and 2+ on the left side.     Heart sounds: Normal heart sounds, S1 normal and S2 normal.  Heart sounds not distant. No murmur heard.   No friction rub. No gallop. No S3 or S4 sounds.  Pulmonary:     Effort: Pulmonary effort is normal. No accessory muscle usage or respiratory distress.     Breath sounds: Normal breath sounds. No stridor. No wheezing, rhonchi or rales.     Comments: Lungs CTA bilaterally, No increased work of breathing or accessory muscle usage. Chest:     Chest wall: No tenderness.  Abdominal:     General: Abdomen is flat. Bowel sounds are normal. There is no distension.     Palpations: Abdomen is soft. There is no mass or pulsatile mass.     Tenderness: There is no abdominal tenderness. There is no guarding or rebound.     Comments: No focal tenderness on abdominal exam. Abdomen is soft.   Musculoskeletal:     Right lower leg: No edema.     Left lower leg: No edema.  Skin:    General: Skin is warm and dry.     Coloration: Skin is not jaundiced or pale.     Findings: No bruising, erythema, lesion or rash.  Neurological:     General: No focal deficit present.     Mental Status: He is alert and oriented to person, place, and time.     GCS: GCS eye subscore is 4. GCS verbal subscore is 5. GCS motor subscore is 6.  Psychiatric:        Mood and Affect: Mood normal.        Behavior: Behavior normal. Behavior is cooperative.    ED Results / Procedures / Treatments   Labs (all labs ordered are listed, but  only abnormal results are displayed) Labs Reviewed  BASIC METABOLIC PANEL - Abnormal; Notable for the following components:      Result Value   Glucose, Bld 144 (*)    BUN 34 (*)    Creatinine, Ser 2.09 (*)    GFR, Estimated 33 (*)    All other components within normal limits  CBC - Abnormal; Notable for the following components:   RBC 3.71 (*)    Hemoglobin 11.3 (*)    HCT 34.7 (*)    All other components within normal limits  RESP PANEL BY RT-PCR (FLU A&B, COVID) ARPGX2  HEPATIC FUNCTION PANEL  LIPASE, BLOOD  TROPONIN I (HIGH SENSITIVITY)  TROPONIN I (HIGH SENSITIVITY)    EKG EKG Interpretation  Date/Time:  Tuesday December 08 2021 10:25:01 EST Ventricular Rate:  77 PR Interval:  152 QRS Duration: 148 QT Interval:  404 QTC Calculation: 457 R Axis:   61 Text Interpretation: Normal sinus rhythm Left bundle branch block Abnormal ECG When compared with ECG of 10-Jul-2020 10:03, No significant change was found since last tracing no significant change Confirmed by Malvin Johns [phone]) on 12/08/2021 10:49:59 AM  Radiology DG Chest 2 View  Result Date: 12/08/2021 CLINICAL DATA:  Chest pain EXAM: CHEST - 2 VIEW COMPARISON:  07/07/2020 FINDINGS: The heart size and mediastinal contours are within normal limits. Both lungs are clear. No pleural effusion or pneumothorax. The visualized skeletal structures are unremarkable. IMPRESSION: No acute process in the chest. Electronically Signed   By: Macy Mis M.D.   On: 12/08/2021 10:37    Procedures Procedures  Cardiac monitoring in place while in ED.  Medications Ordered in ED Medications  alum & mag hydroxide-simeth (MAALOX/MYLANTA) 200-200-20 MG/5ML suspension 30 mL (30 mLs Oral Given 12/08/21 1303)    And  lidocaine (XYLOCAINE) 2 %  viscous mouth solution 15 mL (15 mLs Oral Given 12/08/21 1303)  dicyclomine (BENTYL) capsule 10 mg (10 mg Oral Given 12/08/21 1303)  sodium chloride 0.9 % bolus 1,000 mL (0 mLs Intravenous Stopped  12/08/21 1511)  ondansetron (ZOFRAN) injection 4 mg (4 mg Intravenous Given 12/08/21 1253)    ED Course/ Medical Decision Making/ A&P                           Medical Decision Making Amount and/or Complexity of Data Reviewed External Data Reviewed: labs, radiology, ECG and notes. Labs: ordered. Decision-making details documented in ED Course. Radiology: ordered and independent interpretation performed. Decision-making details documented in ED Course. ECG/medicine tests: ordered and independent interpretation performed. Decision-making details documented in ED Course.  Risk OTC drugs. Prescription drug management.   This is a 71 y.o. male with a PMH of Diabetes type 2, coronary artery disease status post stents, hyperlipidemia, TIA, previous CVA with residual left hemibody paresis, hypertension, GERD, CKD stage III, CHF who presents to the ED with n, v, and d for four days. He also had an episode of chest pain overnight that resolved with tums.   Review of Past Records: Patient has significant cardiac history.  He follows by heart care cardiology.  His last admission was in 2021 where he got a cardiac catheterization and stent placement.  Vitals are stable. Appears clinically dehydrated.  Abdomen exam is unremarkable.  Patient is no longer symptomatic with chest pain.  I feel this is likely to be a gastrointestinal cause of his symptoms.  We will plan to give GI cocktail, Zofran, and IV fluids.  Further abdominal labs were ordered.  I personally reviewed all laboratory work and imaging. Abnormal results outlined below.  CBC with stable anemia (hgb 11.3), BMP with stable creat 2.09. Lipase, Troponins, Hepatic function panel, and respiratory panel are reassuring. CXR with no acute abnormality. EKG with NSR and similar to prior.   Reassuring labs with no leukocytosis.  Patient feels better after GI cocktail and fluids.  Vitals have remained stable.  He is able to tolerate p.o. intake without  difficulties.  I feel that patient likely has a viral gastroenteritis and is starting to recover from this.  I feel he is stable for discharge home.  I encourage plenty of fluids at home.  We will provide antiemetic at discharge.  Cautions provided. Return precautions provided.   I have seen and evaluated this patient in conjunction with my attending physician who agrees and has made changes to the plan accordingly.  Portions of this note were generated with [employer]. Dictation errors may occur despite best attempts at proofreading.  Final Clinical Impression(s) / ED Diagnoses Final diagnoses:  Nausea and vomiting, unspecified vomiting type  Nonspecific chest pain    Rx / DC Orders ED Discharge Orders          Ordered    ondansetron (ZOFRAN) 4 MG tablet  Every 8 hours PRN        12/08/21 1457              Laylaa Guevarra, Adora Fridge, PA-C 12/08/21 2207    Sherre Poot, Adora Fridge, PA-C 12/08/21 2208    Malvin Johns, MD 12/12/21 1501

## 2021-12-08 NOTE — ED Triage Notes (Signed)
Vomiting since Friday night then began having chest pain last night. Central chest pain that does not radiate. Took tums last night with some relief. Subjective fever/sweating since Friday. N/v/d since Friday. States intermit blurry vision and some SOB. Denies HA. Hx of stroke with left sided weakness at baseline.

## 2021-12-08 NOTE — ED Notes (Signed)
Patient able to tolerate PO fluids

## 2021-12-08 NOTE — Telephone Encounter (Signed)
° °  Pt c/o of Chest Pain: STAT if CP now or developed within 24 hours  1. Are you having CP right now? No   2. Are you experiencing any other symptoms (ex. SOB, nausea, vomiting, sweating)?   3. How long have you been experiencing CP? Couple of days  4. Is your CP continuous or coming and going? Coming and going  5. Have you taken Nitroglycerin?  ?   Pt said he's been having CP and would like to f/u with Dr. Oval Linsey, he made an appt with Terie Purser today

## 2021-12-08 NOTE — Telephone Encounter (Signed)
Called patient to just follow up on reports of chest pain. Patient reports that he has been having chest pain on and off since Friday. It has continued on and off. Patient also endorses throwing up all day Friday and Saturday and 1 time Monday. He also reports profusely sweating and unable to keep food down other than Pedialyte drinks.   Consulted with Laurann Montana, NP and encouraged the patient to present to the Emergency Department due to symptoms of unstable angina. Patient is hesitant to present to the ED due to long wait times. Encouraged patient to report to the [facility] ED at [place] due to decreased wait times.    Will monitor chart to see if patient presents before cancelling appointment.

## 2021-12-08 NOTE — Discharge Instructions (Signed)
You were seen in the ED today for chest pain, nausea, and vomiting. Your labs look stable from  your baseline. Your cardiac workup was negative. You tested negative for COVID/Flu.  I have provided you with a prescription for an antinausea medication that you can take.  I encourage plenty of fluids at home. Please return if your symptoms start to worsen.

## 2021-12-13 NOTE — Progress Notes (Signed)
Subjective:    Patient ID: [name], male    DOB: Sep 26, 1951, 71 y.o.   MRN: [id number]  This visit occurred during the SARS-CoV-2 public health emergency.  Safety protocols were in place, including screening questions prior to the visit, additional usage of staff PPE, and extensive cleaning of exam room while observing appropriate contact time as indicated for disinfecting solutions.     HPI The patient is here for follow up from the ED.   He went ot the ED 1/17 for chest pain.  He stated N/V x 5 days.  Her vomit was nonbloody, nonbilious.  2 days prior he started having chest pain that was in the center of his chest w/o radiating.  Lasted 30 min-1 hr, better with tums. He had chills, sweats.  The next day pain was gone, but still had nausea and developed diarrhea.  No diarrhea, N/V the day he went to ED.  W/u in ED neg - labs, cxr, ekg, troponin neg.  Symptoms improved after GI cocktail.  Dx - likely viral gastro enteritis.    Since leaving the hospital he had difficulty breathing after taking the medication - zofran they prescribed.  It felt like his throat was closing up.  He had diarrhea x 2 days - water.  BM looked gray.     Past couple of days - chills, cough.   He is able to keep food and water down.  He denies nausea the past couple of days.  Overall he does feel better these past couple of days compared to when he came home from the hospital.  Medications and allergies reviewed with patient and updated if appropriate.  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   Observation and evaluation for other specified suspected conditions 03/25/2021   Vocal cord paralysis 03/25/2021   Diabetic peripheral  neuropathy ([place]) 12/25/2020   Orthopnea 12/16/2020   OSA on CPAP 09/09/2020   Unstable angina ([place]) 07/09/2020   Sciatica of left side 07/11/2019   Cough 07/11/2019   Edema leg 10/26/2018   History of stroke 10/26/2018   DOE (dyspnea on exertion) 10/26/2018   Left hip pain 07/14/2018   Acute left ankle pain 07/14/2018   Chest pain 07/14/2018   Acute non-recurrent ethmoidal sinusitis 03/13/2018   Nonintractable headache 02/24/2018   CRI (chronic renal insufficiency), stage 3 (moderate) 02/22/2017   Anemia 02/16/2017   Chronic combined systolic and diastolic heart failure ([place]) 06/29/2016   LBBB (left bundle branch block) 02/02/2016   Nonallopathic lesion of lumbosacral region 12/11/2014   Nonallopathic lesion of sacral region 12/11/2014   Nonallopathic lesion of thoracic region 12/11/2014   Arthritis of left hip 11/20/2014   Ischial bursitis of left side 10/28/2014   Hamstring tightness of left lower extremity 09/16/2014   Piriformis syndrome of left side 09/16/2014   Dysphonia 06/22/2013   Hemiparesis affecting left side as late effect of stroke ([place]) 10/10/2012   Dysphagia following cerebrovascular accident 08/14/2012   Chronic back pain    Peripheral neuropathy ([place])    TIA (transient ischemic attack) 04/08/2012   Stroke ([place]) 02/21/2012   MORTON'S NEUROMA, RIGHT 05/29/2010   Diabetes ([place]) 04/13/2010   ALLERGIC RHINITIS 04/13/2010   GERD 04/13/2010   ERECTILE DYSFUNCTION 03/12/2009  Essential hypertension 03/12/2009   CAD S/P percutaneous coronary angioplasty 03/12/2009   Hyperlipidemia 03/07/2009    Current Outpatient Medications on File Prior to Visit  Medication Sig Dispense Refill   amLODipine (NORVASC) 5 MG tablet Take 5 mg by mouth daily.     aspirin EC 81 MG tablet Take 1 tablet (81 mg total) by mouth daily. Swallow whole. 30 tablet 11   atorvastatin (LIPITOR) 40 MG tablet TAKE 1 TABLET EVERY DAY 90 tablet 3   Blood Pressure Monitoring (BLOOD PRESSURE CUFF)  MISC 1 Units by Does not apply route daily. 1 each 0   calcium carbonate (TUMS - DOSED IN MG ELEMENTAL CALCIUM) 500 MG chewable tablet Chew 1 tablet by mouth daily as needed for indigestion or heartburn.     carvedilol (COREG) 12.5 MG tablet TAKE 1 AND 1/2 TABLETS TWICE DAILY 270 tablet 2   chlorthalidone (HYGROTON) 25 MG tablet Take 1 tablet (25 mg total) by mouth daily. 90 tablet 3   clopidogrel (PLAVIX) 75 MG tablet Take 1 tablet (75 mg total) by mouth daily. Please schedule annual appt in April for refills. Thank you 90 tablet 3   Continuous Blood Gluc Sensor (FREESTYLE LIBRE 2 SENSOR) MISC 1 Device by Does not apply route every 14 (fourteen) days. 6 each 3   Dulaglutide (TRULICITY) 9.48 NI/6.2VO SOPN Inject 0.5 mLs (0.75 mg total) into the skin once a week. 6 mL 3   furosemide (LASIX) 40 MG tablet TAKE 1 TABLET (40 MG TOTAL) BY MOUTH DAILY. NEED APPT. 90 tablet 3   glucose blood (ONETOUCH VERIO) test strip 1 each by Other route 2 (two) times daily. And lancets 2/day 180 each 3   Insulin Lispro Prot & Lispro (HUMALOG MIX 50/50 KWIKPEN) (50-50) 100 UNIT/ML Kwikpen Inject 26 Units into the skin daily with breakfast. 45 mL 1   losartan (COZAAR) 100 MG tablet TAKE 1 TABLET EVERY DAY 90 tablet 3   ondansetron (ZOFRAN) 4 MG tablet Take 1 tablet (4 mg total) by mouth every 8 (eight) hours as needed for nausea or vomiting. 20 tablet 0   OneTouch Delica Lancets 35K MISC 1 each by Does not apply route 2 (two) times daily. Use to monitor glucose levels BID; E11.9 100 each 3   RELION PEN NEEDLES 31G X 6 MM MISC USE 1  ONCE DAILY 50 each 0   senna-docusate (SENOKOT-S) 8.6-50 MG per tablet Take 2 tablets by mouth 2 (two) times daily. For constipation.     nitroGLYCERIN (NITROSTAT) 0.4 MG SL tablet Place 1 tablet (0.4 mg total) under the tongue every 5 (five) minutes as needed for chest pain (up to 3 doses). 25 tablet 3   No current facility-administered medications on file prior to visit.    Past Medical  History:  Diagnosis Date   ALLERGIC RHINITIS    CAD, NATIVE VESSEL    BMS to [facility] 2001, DES to BMS 2005   Chronic back pain    herniated disc   Chronic combined systolic and diastolic heart failure ([place]) 06/29/2016   Constipation    takes Carafate four times day   DIAB W/UNSPEC COMP TYPE II/UNSPEC TYPE UNCNTRL    ERECTILE DYSFUNCTION    GERD    HYPERLIPIDEMIA-MIXED    HYPERTENSION, BENIGN    LBBB (left bundle branch block) 02/02/2016   MORTON'S NEUROMA, RIGHT    OSA on CPAP 09/09/2020   Peripheral neuropathy    Seasonal allergies    takes Allegra and Benadryl daily prn;uses Flonase daily  SHOULDER PAIN, RIGHT    Snoring 09/09/2020   Stroke, 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   L HP + hemiparesis, s/p CIR    TIA on medication 02/2012   Unstable angina ([place]) 07/09/2020    Past Surgical History:  Procedure Laterality Date   ANGIOPLASTY     COLONOSCOPY     CORONARY ANGIOPLASTY  2005   2 stents   CORONARY STENT INTERVENTION N/A 07/10/2020   Procedure: CORONARY STENT INTERVENTION;  Surgeon: Martinique, Peter M, MD;  Location: [facility];  Service: Cardiovascular;  Laterality: N/A;   DENTAL SURGERY     LARYNGOPLASTY  08/07/2012   Procedure: LARYNGOPLASTY;  Surgeon: Izora Gala, MD;  Location: [facility];  Service: ENT;  Laterality: Left;  Left Vocal Cord Medialyzation   LEFT HEART CATH AND CORONARY ANGIOGRAPHY N/A 07/10/2020   Procedure: LEFT HEART CATH AND CORONARY ANGIOGRAPHY;  Surgeon: Martinique, Peter M, MD;  Location: [facility];  Service: Cardiovascular;  Laterality: N/A;   left knee surgury     x 2    stent  2001, 2004   coronary stents    Social History   Socioeconomic History   Marital status: Legally Separated    Spouse name: Not on file   Number of children: 3   Years of education: college   Highest education level: Not on file  Occupational History   Occupation: disabled    Employer: [employer]  Tobacco Use   Smoking status: Former    Packs/day: 2.50    Years: 9.00     Pack years: 22.50    Types: Cigarettes    Quit date: 02/24/1983    Years since quitting: 38.8   Smokeless tobacco: Never  Vaping Use   Vaping Use: Never used  Substance and Sexual Activity   Alcohol use: No    Alcohol/week: 0.0 standard drinks   Drug use: No   Sexual activity: Never  Other Topics Concern   Not on file  Social History Narrative      Social Determinants of Health   Financial Resource Strain: Low Risk    Difficulty of Paying Living Expenses: Not hard at all  Food Insecurity: No Food Insecurity   Worried About Charity fundraiser in the Last Year: Never true   [place] in the Last Year: Never true  Transportation Needs: No Transportation Needs   Lack of Transportation (Medical): No   Lack of Transportation (Non-Medical): No  Physical Activity: Inactive   Days of Exercise per Week: 0 days   Minutes of Exercise per Session: 0 min  Stress: No Stress Concern Present   Feeling of Stress : Not at all  Social Connections: Moderately Integrated   Frequency of Communication with Friends and Family: More than three times a week   Frequency of Social Gatherings with Friends and Family: More than three times a week   Attends Religious Services: 1 to 4 times per year   Active Member of [employer] or Organizations: No   Attends Music therapist: 1 to 4 times per year   Marital Status: Never married    Family History  Problem Relation Age of Onset   Cancer Mother     Review of Systems  Constitutional:  Positive for chills and diaphoresis. Negative for fever (has not checked).  HENT:  Positive for rhinorrhea, sneezing and sore throat (itchy and sore). Negative for congestion.   Respiratory:  Positive for cough (mild sputum at times) and shortness of  breath. Negative for wheezing.   Cardiovascular:  Negative for chest pain (none since day of hospital).  Gastrointestinal:  Positive for abdominal pain (cramping) and diarrhea (dark - grayish in color).  Negative for blood in stool, nausea and vomiting.  Neurological:  Positive for light-headedness and headaches (occ).      Objective:   Vitals:   12/14/21 1005  BP: (!) 142/80  Pulse: 80  Temp: 98.2 F (36.8 C)  SpO2: 99%   BP Readings from Last 3 Encounters:  12/14/21 (!) 142/80  12/08/21 132/80  12/03/21 118/64   Wt Readings from Last 3 Encounters:  12/14/21 208 lb 9.6 oz (94.6 kg)  12/08/21 214 lb (97.1 kg)  12/03/21 215 lb 12.8 oz (97.9 kg)   Body mass index is 29.09 kg/m.   Physical Exam    Constitutional: Appears well-developed and well-nourished. No distress.  HENT:  Head: Normocephalic and atraumatic.  Cardiovascular: Normal rate, regular rhythm and normal heart sounds.    No edema Pulmonary/Chest: Effort normal and breath sounds normal. No respiratory distress. No has no wheezes. No rales. Abdomen: Soft, nontender, nondistended Skin: Skin is warm and dry. Not diaphoretic.  Ganglion cyst left posterior wrist-mobile, nontender Psychiatric: Normal mood and affect. Behavior is normal.   Lab Results  Component Value Date   WBC 5.8 12/08/2021   HGB 11.3 (L) 12/08/2021   HCT 34.7 (L) 12/08/2021   PLT 178 12/08/2021   GLUCOSE 144 (H) 12/08/2021   CHOL 112 04/15/2020   TRIG 136.0 04/15/2020   HDL 44.60 04/15/2020   LDLDIRECT 63.0 01/12/2016   LDLCALC 40 04/15/2020   ALT 29 12/08/2021   AST 29 12/08/2021   NA 138 12/08/2021   K 3.8 12/08/2021   CL 103 12/08/2021   CREATININE 2.09 (H) 12/08/2021   BUN 34 (H) 12/08/2021   CO2 26 12/08/2021   TSH 2.08 10/17/2019   PSA 0.14 08/17/2016   INR 1.1 07/10/2020   HGBA1C 6.8 (A) 11/03/2021   MICROALBUR 7.5 (H) 08/17/2016    DG Chest 2 View CLINICAL DATA:  Chest pain  EXAM: CHEST - 2 VIEW  COMPARISON:  07/07/2020  FINDINGS: The heart size and mediastinal contours are within normal limits. Both lungs are clear. No pleural effusion or pneumothorax. The visualized skeletal structures are  unremarkable.  IMPRESSION: No acute process in the chest.  Electronically Signed   By: Macy Mis M.D.   On: 12/08/2021 10:37     Assessment & Plan:    See Problem List for Assessment and Plan of chronic medical problems.

## 2021-12-14 ENCOUNTER — Other Ambulatory Visit: Payer: Self-pay

## 2021-12-14 ENCOUNTER — Encounter: Payer: Self-pay | Admitting: Internal Medicine

## 2021-12-14 ENCOUNTER — Other Ambulatory Visit (HOSPITAL_BASED_OUTPATIENT_CLINIC_OR_DEPARTMENT_OTHER): Payer: Self-pay

## 2021-12-14 ENCOUNTER — Telehealth: Payer: Self-pay | Admitting: Internal Medicine

## 2021-12-14 ENCOUNTER — Ambulatory Visit (INDEPENDENT_AMBULATORY_CARE_PROVIDER_SITE_OTHER): Payer: Medicare HMO | Admitting: Internal Medicine

## 2021-12-14 VITALS — BP 142/80 | HR 80 | Temp 98.2°F | Ht 71.0 in | Wt 208.6 lb

## 2021-12-14 DIAGNOSIS — I10 Essential (primary) hypertension: Secondary | ICD-10-CM | POA: Diagnosis not present

## 2021-12-14 DIAGNOSIS — A084 Viral intestinal infection, unspecified: Secondary | ICD-10-CM | POA: Diagnosis not present

## 2021-12-14 DIAGNOSIS — M67432 Ganglion, left wrist: Secondary | ICD-10-CM | POA: Insufficient documentation

## 2021-12-14 MED ORDER — ACCU-CHEK AVIVA PLUS VI STRP
ORAL_STRIP | 12 refills | Status: DC
  Filled 2021-12-14: qty 100, 30d supply, fill #0

## 2021-12-14 MED ORDER — ACCU-CHEK AVIVA PLUS VI STRP: ORAL_STRIP | 12 refills | Status: AC

## 2021-12-14 MED ORDER — UNIFINE PENTIPS 31G X 6 MM MISC: 0 refills | Status: DC

## 2021-12-14 MED ORDER — UNIFINE PENTIPS 31G X 6 MM MISC
0 refills | Status: DC
  Filled 2021-12-14: qty 100, 90d supply, fill #0

## 2021-12-14 MED ORDER — MICROLET LANCETS MISC
3 refills | Status: AC
Start: 2021-12-14 — End: ?
  Filled 2021-12-14: qty 270, fill #0

## 2021-12-14 NOTE — Assessment & Plan Note (Signed)
Acute Evaluated emergency room and work-up was reassuring and suggestive of viral gastroenteritis Agreed that he likely has a viral gastroenteritis.  He states everyone in the house has had similar symptoms His symptoms are improving-he notes improvement in the last couple of days especially No longer nauseous or having vomiting He is eating and drinking a good amount of water He is still having some diarrhea, but that is improving and should continue to improve [place], continue eating as tolerated and drinking plenty of fluids Advised that his symptoms should continue to improve and if they do not he should call and let me know

## 2021-12-14 NOTE — Patient Instructions (Addendum)
Medications changes include :   none    Viral Gastroenteritis, Adult Viral gastroenteritis is also known as the stomach flu. This condition may affect your stomach, small intestine, and large intestine. It can cause sudden watery diarrhea, fever, and vomiting. This condition is caused by many different viruses. These viruses can be passed from person to person very easily (are contagious). Diarrhea and vomiting can make you feel weak and cause you to become dehydrated. You may not be able to keep fluids down. Dehydration can make you tired and thirsty, cause you to have a dry mouth, and decrease how often you urinate. It is important to replace the fluids that you lose from diarrhea and vomiting. What are the causes? Gastroenteritis is caused by many viruses, including rotavirus and norovirus. Norovirus is the most common cause in adults. You can get sick after being exposed to the viruses from other people. You can also get sick by: Eating food, drinking water, or touching a surface contaminated with one of these viruses. Sharing utensils or other personal items with an infected person. What increases the risk? You are more likely to develop this condition if you: Have a weak body defense system (immune system). Live with one or more children who are younger than 16 years old. Live in a nursing home. Travel on cruise ships. What are the signs or symptoms? Symptoms of this condition start suddenly 1-3 days after exposure to a virus. Symptoms may last for a few days or for as long as a week. Common symptoms include watery diarrhea and vomiting. Other symptoms include: Fever. Headache. Fatigue. Pain in the abdomen. Chills. Weakness. Nausea. Muscle aches. Loss of appetite. How is this diagnosed? This condition is diagnosed with a medical history and physical exam. You may also have a stool test to check for viruses or other infections. How is this treated? This condition typically  goes away on its own. The focus of treatment is to prevent dehydration and restore lost fluids (rehydration). This condition may be treated with: An oral rehydration solution (ORS) to replace important salts and minerals (electrolytes) in your body. Take this if told by your health care provider. This is a drink that is sold at pharmacies and retail stores. Medicines to help with your symptoms. Probiotic supplements to reduce symptoms of diarrhea. Fluids given through an IV, if dehydration is severe. Older adults and people with other diseases or a weak immune system are at higher risk for dehydration. Follow these instructions at home: Eating and drinking  Take an ORS as told by your health care provider. Drink clear fluids in small amounts as you are able. Clear fluids include: Water. Ice chips. Diluted fruit juice. Low-calorie sports drinks. Drink enough fluid to keep your urine pale yellow. Eat small amounts of healthy foods every 3-4 hours as you are able. This may include whole grains, fruits, vegetables, lean meats, and yogurt. Avoid fluids that contain a lot of sugar or caffeine, such as energy drinks, sports drinks, and soda. Avoid spicy or fatty foods. Avoid alcohol. General instructions Wash your hands often, especially after having diarrhea or vomiting. If soap and water are not available, use hand sanitizer. Make sure that all people in your household wash their hands well and often. Take over-the-counter and prescription medicines only as told by your health care provider. Rest at home while you recover. Watch your condition for any changes. Take a warm bath to relieve any burning or pain from frequent diarrhea  episodes. Keep all follow-up visits as told by your health care provider. This is important. Contact a health care provider if you: Cannot keep fluids down. Have symptoms that get worse. Have new symptoms. Feel light-headed or dizzy. Have muscle cramps. Get help  right away if you: Have chest pain. Feel extremely weak or you faint. See blood in your vomit. Have vomit that looks like coffee grounds. Have bloody or black stools or stools that look like tar. Have a severe headache, a stiff neck, or both. Have a rash. Have severe pain, cramping, or bloating in your abdomen. Have trouble breathing or you are breathing very quickly. Have a fast heartbeat. Have skin that feels cold and clammy. Feel confused. Have pain when you urinate. Have signs of dehydration, such as: Dark urine, very little urine, or no urine. Cracked lips. Dry mouth. Sunken eyes. Sleepiness. Weakness. Summary Viral gastroenteritis is also known as the stomach flu. It can cause sudden watery diarrhea, fever, and vomiting. This condition can be passed from person to person very easily (is contagious). Take an ORS if told by your health care provider. This is a drink that is sold at pharmacies and retail stores. Wash your hands often, especially after having diarrhea or vomiting. If soap and water are not available, use hand sanitizer. This information is not intended to replace advice given to you by your health care provider. Make sure you discuss any questions you have with your health care provider. Document Revised: 04/27/2019 Document Reviewed: 09/13/2018 Elsevier Patient Education  2022 [place].     Ganglion Cyst A ganglion cyst is a non-cancerous, fluid-filled lump of tissue that occurs near a joint, tendon, or ligament. The cyst grows out of a joint or the lining of a tendon or ligament. Ganglion cysts most often develop in the hand or wrist, but they can also develop in the shoulder, elbow, hip, knee, ankle, or foot. Ganglion cysts are ball-shaped or egg-shaped. Their size can range from the size of a pea to larger than a grape. Increased activity may cause the cyst to get bigger because more fluid starts to build up. What are the causes? The exact cause of  this condition is not known, but it may be related to: Inflammation or irritation around the joint. An injury or tear in the layers of tissue around the joint (joint capsule). Repetitive movements or overuse. History of acute or repeated injury. What increases the risk? You are more likely to develop this condition if: You are a male. You are 52-57 years old. What are the signs or symptoms? The main symptom of this condition is a lump. It most often appears on the hand or wrist. In many cases, there are no other symptoms, but a cyst can sometimes cause: Tingling. Pain or tenderness. Numbness. Weakness or loss of strength in the affected joint. Decreased range of motion in the affected area of the body. How is this diagnosed? Ganglion cysts are usually diagnosed based on a physical exam. Your health care provider will feel the lump and may shine a light next to it. If it is a ganglion cyst, the light will likely shine through it. Your health care provider may order an X-ray, ultrasound, MRI, or CT scan to rule out other conditions. How is this treated? Ganglion cysts often go away on their own without treatment. If you have pain or other symptoms, treatment may be needed. Treatment is also needed if the ganglion cyst limits your movement or if  it gets infected. Treatment may include: Wearing a brace or splint on your wrist or finger. Taking anti-inflammatory medicine. Having fluid drained from the lump with a needle (aspiration). Getting an injection of medicine into the joint to decrease inflammation. This may be corticosteroids, ethanol, or hyaluronidase. Having surgery to remove the ganglion cyst. Placing a pad in your shoe or wearing shoes that will not rub against the cyst if it is on your foot. Follow these instructions at home: Do not press on the ganglion cyst, poke it with a needle, or hit it. Take over-the-counter and prescription medicines only as told by your health care  provider. If you have a brace or splint: Wear it as told by your health care provider. Remove it as told by your health care provider. Ask if you need to remove it when you take a shower or a bath. Watch your ganglion cyst for any changes. Keep all follow-up visits as told by your health care provider. This is important. Contact a health care provider if: Your ganglion cyst becomes larger or more painful. You have pus coming from the lump. You have weakness or numbness in the affected area. You have a fever or chills. Get help right away if: You have a fever and have any of these in the cyst area: Increased redness. Red streaks. Swelling. Summary A ganglion cyst is a non-cancerous, fluid-filled lump that occurs near a joint, tendon, or ligament. Ganglion cysts most often develop in the hand or wrist, but they can also develop in the shoulder, elbow, hip, knee, ankle, or foot. Ganglion cysts often go away on their own without treatment. This information is not intended to replace advice given to you by your health care provider. Make sure you discuss any questions you have with your health care provider. Document Revised: 01/30/2020 Document Reviewed: 01/30/2020 Elsevier Patient Education  [place].

## 2021-12-14 NOTE — Assessment & Plan Note (Signed)
Acute He noticed a lump on the left posterior wrist-consistent with ganglion cyst Reassured him this is nothing concerning Discussed that it can be removed in the future if you want it removed

## 2021-12-14 NOTE — Telephone Encounter (Signed)
Resent to [facility] today for patient.

## 2021-12-14 NOTE — Telephone Encounter (Signed)
Pharmacy states they do not accept pt's medicare B insurance  Pharmacy states pt is requesting rx glucose blood (ACCU-CHEK AVIVA PLUS) test strip Insulin Pen Needle (UNIFINE PENTIPS) 31G X 6 MM MISC  sent to   [facility], [place], [place] [address]

## 2021-12-14 NOTE — Assessment & Plan Note (Signed)
Chronic Blood pressure elevated here today slightly higher than usual Overall blood pressure is well controlled No change in medications-monitor Continue losartan 100 mg daily, chlorthalidone 25 mg daily, Coreg 18.75 mg twice daily and amlodipine 5 mg daily

## 2021-12-15 ENCOUNTER — Telehealth: Payer: Self-pay | Admitting: Dietician

## 2021-12-15 ENCOUNTER — Encounter (INDEPENDENT_AMBULATORY_CARE_PROVIDER_SITE_OTHER): Payer: Medicare HMO | Admitting: Ophthalmology

## 2021-12-15 DIAGNOSIS — E113393 Type 2 diabetes mellitus with moderate nonproliferative diabetic retinopathy without macular edema, bilateral: Secondary | ICD-10-CM | POA: Diagnosis not present

## 2021-12-15 DIAGNOSIS — H353122 Nonexudative age-related macular degeneration, left eye, intermediate dry stage: Secondary | ICD-10-CM | POA: Diagnosis not present

## 2021-12-15 DIAGNOSIS — I10 Essential (primary) hypertension: Secondary | ICD-10-CM

## 2021-12-15 DIAGNOSIS — H35033 Hypertensive retinopathy, bilateral: Secondary | ICD-10-CM | POA: Diagnosis not present

## 2021-12-15 DIAGNOSIS — H35371 Puckering of macula, right eye: Secondary | ICD-10-CM

## 2021-12-15 DIAGNOSIS — H43813 Vitreous degeneration, bilateral: Secondary | ICD-10-CM

## 2021-12-15 NOTE — Telephone Encounter (Signed)
Patient called and stated that he still has not been able to obtain the [facility].  Returned patient call to discuss. Patient did call [facility] to make an account. He tried to call them recently to determine the status and was not able to get through. I called [facility] but I am not on the approved list for communication. Discussed with patient to have me added to the list at [facility] if desired and call [facility] to determine the status of his order. [place], RD, LDN, CDCES

## 2021-12-22 ENCOUNTER — Other Ambulatory Visit: Payer: Self-pay

## 2021-12-22 ENCOUNTER — Telehealth: Payer: Self-pay | Admitting: Endocrinology

## 2021-12-22 DIAGNOSIS — E119 Type 2 diabetes mellitus without complications: Secondary | ICD-10-CM

## 2021-12-22 MED ORDER — HUMALOG MIX 50/50 KWIKPEN (50-50) 100 UNIT/ML ~~LOC~~ SUPN: 26.0000 [IU] | PEN_INJECTOR | Freq: Every day | SUBCUTANEOUS | 1 refills | Status: DC

## 2021-12-22 NOTE — Telephone Encounter (Signed)
Patient needs the below medication to be sent to: [facility]  Insulin Lispro Prot & Lispro (HUMALOG MIX 50/50 KWIKPEN) (50-50) 100 UNIT/ML Kwikpen  Please provide confirmation to patient at [phone]

## 2021-12-22 NOTE — Telephone Encounter (Signed)
Prescription for insulin lispro has now been faxed to [facility] via [facility]

## 2021-12-23 ENCOUNTER — Telehealth: Payer: Self-pay

## 2021-12-23 NOTE — Progress Notes (Signed)
Chronic Care Management Pharmacy Assistant   Name: Aztlan Coll  MRN: [id number] DOB: [birth year]   Reason for Encounter: Disease State   Conditions to be addressed/monitored: DMII   Recent office visits:  12/14/21 Binnie Rail, MD-PCP (viral gastroenteritis) Medications changes include : none  Recent consult visits:  None ID  Hospital visits:  Medication Reconciliation was completed by comparing discharge summary, patients EMR and Pharmacy list, and upon discussion with patient.  Admitted to the hospital on 12/08/21 due to nausea and vomiting. Discharge date was 12/08/21. Discharged from [facility] ED.  New?Medications Started at [facility] Discharge:?? -started ondansetron 4 mg   Medications that remain the same after Hospital Discharge:??  -All other medications will remain the same.    Medications: Outpatient Encounter Medications as of 12/23/2021  Medication Sig Note   amLODipine (NORVASC) 5 MG tablet Take 5 mg by mouth daily.    aspirin EC 81 MG tablet Take 1 tablet (81 mg total) by mouth daily. Swallow whole.    atorvastatin (LIPITOR) 40 MG tablet TAKE 1 TABLET EVERY DAY    Blood Pressure Monitoring (BLOOD PRESSURE CUFF) MISC 1 Units by Does not apply route daily.    calcium carbonate (TUMS - DOSED IN MG ELEMENTAL CALCIUM) 500 MG chewable tablet Chew 1 tablet by mouth daily as needed for indigestion or heartburn.    carvedilol (COREG) 12.5 MG tablet TAKE 1 AND 1/2 TABLETS TWICE DAILY    chlorthalidone (HYGROTON) 25 MG tablet Take 1 tablet (25 mg total) by mouth daily.    clopidogrel (PLAVIX) 75 MG tablet Take 1 tablet (75 mg total) by mouth daily. Please schedule annual appt in April for refills. Thank you    Continuous Blood Gluc Sensor (FREESTYLE LIBRE 2 SENSOR) MISC 1 Device by Does not apply route every 14 (fourteen) days.    Dulaglutide (TRULICITY) 5.63 JS/9.7WY SOPN Inject 0.5 mLs (0.75 mg total) into the skin once a week. 12/08/2021: Every  sunday   furosemide (LASIX) 40 MG tablet TAKE 1 TABLET (40 MG TOTAL) BY MOUTH DAILY. NEED APPT. 12/08/2021: prn   glucose blood (ACCU-CHEK AVIVA PLUS) test strip uad tid    Insulin Lispro Prot & Lispro (HUMALOG MIX 50/50 KWIKPEN) (50-50) 100 UNIT/ML Kwikpen Inject 26 Units into the skin daily with breakfast.    Insulin Pen Needle (UNIFINE PENTIPS) 31G X 6 MM MISC Use once daily    losartan (COZAAR) 100 MG tablet TAKE 1 TABLET EVERY DAY    Microlet Lancets MISC UAD to check sugars TID.  E11.22    nitroGLYCERIN (NITROSTAT) 0.4 MG SL tablet Place 1 tablet (0.4 mg total) under the tongue every 5 (five) minutes as needed for chest pain (up to 3 doses). 12/08/2021: Has on hand   senna-docusate (SENOKOT-S) 8.6-50 MG per tablet Take 2 tablets by mouth 2 (two) times daily. For constipation.    No facility-administered encounter medications on file as of 12/23/2021.   Recent Relevant Labs: Lab Results  Component Value Date/Time   HGBA1C 6.8 (A) 11/03/2021 10:44 AM   HGBA1C 6.6 (A) 08/18/2021 01:12 PM   HGBA1C 7.7 (H) 08/28/2018 10:12 AM   HGBA1C 9.3 (H) 02/24/2018 11:09 AM   MICROALBUR 7.5 (H) 08/17/2016 10:30 AM   MICROALBUR 9.6 (H) 08/07/2015 11:17 AM    Kidney Function Lab Results  Component Value Date/Time   CREATININE 2.09 (H) 12/08/2021 10:25 AM   CREATININE 2.29 (H) 09/10/2020 02:37 PM   GFR 41.31 (L) 04/15/2020 11:28 AM   GFRNONAA 33 (  L) 12/08/2021 10:25 AM   GFRAA 32 (L) 09/10/2020 02:37 PM    Current antihyperglycemic regimen:  Trulicity 7.20 mg once weekly  Humalog mix 50/50 32 units with breakfast  What recent interventions/DTPs have been made to improve glycemic control:  None noted  Have there been any recent hospitalizations or ED visits since last visit with [facility]? No  Patient denies hypoglycemic symptoms, including None Patient denies hyperglycemic symptoms, including none  How often are you checking your blood sugar? Patient states that he has not been able to check  blood sugar because he does not have any lancets. He stated that he has called about this to Dr. Ronnald Ramp but has not gotten a reply back  What are your blood sugars ranging? NA  During the week, how often does your blood glucose drop below 70?  Patient states he does not believe his reading have been that low  Are you checking your feet daily/regularly? Patient states he is not having any swelling, redness or open sores that are not healing  Adherence Review: Is the patient currently on a STATIN medication? Yes Is the patient currently on ACE/ARB medication? Yes Does the patient have >5 day gap between last estimated fill dates? No  Care Gaps: Colonoscopy - 01/02/2021 Diabetic Foot Exam - 11/9/22022 Mammogram - NA Ophthalmology - 09/23/2017 Dexa Scan - NA Annual Well Visit - 01/28/2021 Micro albumin - NA Hemoglobin A1c - 11/03/21   Star Rating Drugs: Atorvastatin - filled 94/70/96 28Z Trulicity - filled 6/62/94 84D (Gets through Patient Assistant) Losartan - fill 10/11/21 [place] Pharmacist Assistant [phone]

## 2022-01-04 ENCOUNTER — Telehealth: Payer: Self-pay | Admitting: Endocrinology

## 2022-01-04 ENCOUNTER — Encounter: Payer: Self-pay | Admitting: Endocrinology

## 2022-01-04 ENCOUNTER — Other Ambulatory Visit: Payer: Self-pay

## 2022-01-04 ENCOUNTER — Ambulatory Visit (INDEPENDENT_AMBULATORY_CARE_PROVIDER_SITE_OTHER): Payer: Medicare HMO | Admitting: Endocrinology

## 2022-01-04 VITALS — BP 128/80 | HR 78 | Ht 71.0 in | Wt 209.8 lb

## 2022-01-04 DIAGNOSIS — Z794 Long term (current) use of insulin: Secondary | ICD-10-CM

## 2022-01-04 DIAGNOSIS — E119 Type 2 diabetes mellitus without complications: Secondary | ICD-10-CM

## 2022-01-04 LAB — POCT GLYCOSYLATED HEMOGLOBIN (HGB A1C): Hemoglobin A1C: 6.9 % — AB (ref 4.0–5.6)

## 2022-01-04 MED ORDER — HUMALOG MIX 50/50 KWIKPEN (50-50) 100 UNIT/ML ~~LOC~~ SUPN: 24.0000 [IU] | PEN_INJECTOR | Freq: Every day | SUBCUTANEOUS | 1 refills | Status: DC

## 2022-01-04 NOTE — Patient Instructions (Addendum)
Please continue the same Trulicity for now, and reduce the insulin to 24 units with breakfast.   On this type of insulin schedule, you should eat meals on a regular schedule (especially breakfast and lunch).  If a meal is missed or significantly delayed, your blood sugar could go low.   If you run out of insulin, you should buy a bottle of 70/30 insulin at [facility].  This is not the same as what you are on, but it is close.  You should ask a friend to draw up 1 week at a time.   Your insurance won't cover the continuous glucose monitor, taking the insulin once per day.   Please come back for a follow-up appointment in 3 months.  check your blood sugar twice a day.  vary the time of day when you check, between before the 3 meals, and at bedtime.  also check if you have symptoms of your blood sugar being too high or too low.  please keep a record of the readings and bring it to your next appointment here (or you can bring the meter itself).  You can write it on any piece of paper.  please call us sooner if your blood sugar goes below 70, or if you have a lot of readings over 200.

## 2022-01-04 NOTE — Telephone Encounter (Signed)
please contact [facility]: pt takes insulin qd.  Does he qualify for continuous glucose monitor?

## 2022-01-04 NOTE — Progress Notes (Signed)
Subjective:    Patient ID: [name], male    DOB: 1951/06/29, 71 y.o.   MRN: [id number]  HPI Pt returns for f/u of diabetes mellitus:  DM type: Insulin-requiring type 2 Dx'ed: 9417 Complications: PN, CAD, CRI, DR, and TIA.   Therapy: insulin since 4081, and Trulicity.   DKA: never.   Severe hypoglycemia: last episode was early 2020.   Pancreatitis: never.  SDOH: he gets meds from [facility]fgr--pt assistance; pt lives alone.  Dtr visits [facility]; [employer] declined continuous glucose monitor.   Other: He says he cannot use syringe and vial, due to tremor; he changed lantus to NPH, then 70/30, then 50/50, due to pattern on cbg's; he declines multiple daily injections; Trulicity dosage is limited by cough and sob; fructosamine has confirmed A1c; he eats meals at 11AM, 2PM, and 8PM.   Interval history: He says he never misses the meds.  pt states he feels well in general.  No recent steroids.  Glucose varies from 117-186.  Pt says ins wants "clinical verification" of insulin inject frequency, in order to approve continuous glucose monitor.  Also, pt says ins will stop covering the Trulicity 02/24/80. He declines alternative GLP.   Past Medical History:  Diagnosis Date   ALLERGIC RHINITIS    CAD, NATIVE VESSEL    BMS to [place] 2001, DES to BMS 2005   Chronic back pain    herniated disc   Chronic combined systolic and diastolic heart failure ([place]) 06/29/2016   Constipation    takes Carafate four times day   DIAB W/UNSPEC COMP TYPE II/UNSPEC TYPE UNCNTRL    ERECTILE DYSFUNCTION    GERD    HYPERLIPIDEMIA-MIXED    HYPERTENSION, BENIGN    LBBB (left bundle branch block) 02/02/2016   MORTON'S NEUROMA, RIGHT    OSA on CPAP 09/09/2020   Peripheral neuropathy    Seasonal allergies    takes Allegra and Benadryl daily prn;uses Flonase daily   SHOULDER PAIN, RIGHT    Snoring 09/09/2020   Stroke, 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   L HP + hemiparesis, s/p CIR    TIA on medication 02/2012   Unstable angina ([place])  07/09/2020    Past Surgical History:  Procedure Laterality Date   ANGIOPLASTY     COLONOSCOPY     CORONARY ANGIOPLASTY  2005   2 stents   CORONARY STENT INTERVENTION N/A 07/10/2020   Procedure: CORONARY STENT INTERVENTION;  Surgeon: Martinique, Peter M, MD;  Location: [facility];  Service: Cardiovascular;  Laterality: N/A;   DENTAL SURGERY     LARYNGOPLASTY  08/07/2012   Procedure: LARYNGOPLASTY;  Surgeon: Izora Gala, MD;  Location: [facility];  Service: ENT;  Laterality: Left;  Left Vocal Cord Medialyzation   LEFT HEART CATH AND CORONARY ANGIOGRAPHY N/A 07/10/2020   Procedure: LEFT HEART CATH AND CORONARY ANGIOGRAPHY;  Surgeon: Martinique, Peter M, MD;  Location: [facility];  Service: Cardiovascular;  Laterality: N/A;   left knee surgury     x 2    stent  2001, 2004   coronary stents    Social History   Socioeconomic History   Marital status: Legally Separated    Spouse name: Not on file   Number of children: 3   Years of education: college   Highest education level: Not on file  Occupational History   Occupation: disabled    Employer: [employer]  Tobacco Use   Smoking status: Former    Packs/day: 2.50    Years: 9.00  Pack years: 22.50    Types: Cigarettes    Quit date: 02/24/1983    Years since quitting: 38.8   Smokeless tobacco: Never  Vaping Use   Vaping Use: Never used  Substance and Sexual Activity   Alcohol use: No    Alcohol/week: 0.0 standard drinks   Drug use: No   Sexual activity: Never  Other Topics Concern   Not on file  Social History Narrative      Social Determinants of Health   Financial Resource Strain: Low Risk    Difficulty of Paying Living Expenses: Not hard at all  Food Insecurity: No Food Insecurity   Worried About Charity fundraiser in the Last Year: Never true   Ran Out of Food in the Last Year: Never true  Transportation Needs: No Transportation Needs   Lack of Transportation (Medical): No   Lack of Transportation  (Non-Medical): No  Physical Activity: Inactive   Days of Exercise per Week: 0 days   Minutes of Exercise per Session: 0 min  Stress: No Stress Concern Present   Feeling of Stress : Not at all  Social Connections: Moderately Integrated   Frequency of Communication with Friends and Family: More than three times a week   Frequency of Social Gatherings with Friends and Family: More than three times a week   Attends Religious Services: 1 to 4 times per year   Active Member of [employer] or Organizations: No   Attends Music therapist: 1 to 4 times per year   Marital Status: Never married  Human resources officer Violence: Not on file    Current Outpatient Medications on File Prior to Visit  Medication Sig Dispense Refill   amLODipine (NORVASC) 5 MG tablet Take 5 mg by mouth daily.     aspirin EC 81 MG tablet Take 1 tablet (81 mg total) by mouth daily. Swallow whole. 30 tablet 11   atorvastatin (LIPITOR) 40 MG tablet TAKE 1 TABLET EVERY DAY 90 tablet 3   Blood Pressure Monitoring (BLOOD PRESSURE CUFF) MISC 1 Units by Does not apply route daily. 1 each 0   calcium carbonate (TUMS - DOSED IN MG ELEMENTAL CALCIUM) 500 MG chewable tablet Chew 1 tablet by mouth daily as needed for indigestion or heartburn.     carvedilol (COREG) 12.5 MG tablet TAKE 1 AND 1/2 TABLETS TWICE DAILY 270 tablet 2   chlorthalidone (HYGROTON) 25 MG tablet Take 1 tablet (25 mg total) by mouth daily. 90 tablet 3   clopidogrel (PLAVIX) 75 MG tablet Take 1 tablet (75 mg total) by mouth daily. Please schedule annual appt in April for refills. Thank you 90 tablet 3   Continuous Blood Gluc Sensor (FREESTYLE LIBRE 2 SENSOR) MISC 1 Device by Does not apply route every 14 (fourteen) days. 6 each 3   Dulaglutide (TRULICITY) 1.28 NO/6.7EH SOPN Inject 0.5 mLs (0.75 mg total) into the skin once a week. 6 mL 3   furosemide (LASIX) 40 MG tablet TAKE 1 TABLET (40 MG TOTAL) BY MOUTH DAILY. NEED APPT. 90 tablet 3   glucose blood (ACCU-CHEK  AVIVA PLUS) test strip uad tid 100 each 12   Insulin Pen Needle (UNIFINE PENTIPS) 31G X 6 MM MISC Use once daily 100 each 0   losartan (COZAAR) 100 MG tablet TAKE 1 TABLET EVERY DAY 90 tablet 3   Microlet Lancets MISC UAD to check sugars TID.  E11.22 270 each 3   senna-docusate (SENOKOT-S) 8.6-50 MG per tablet Take 2 tablets by mouth  2 (two) times daily. For constipation.     nitroGLYCERIN (NITROSTAT) 0.4 MG SL tablet Place 1 tablet (0.4 mg total) under the tongue every 5 (five) minutes as needed for chest pain (up to 3 doses). 25 tablet 3   No current facility-administered medications on file prior to visit.    Allergies  Allergen Reactions   Penicillins Anaphylaxis   Pneumococcal Vaccines Anaphylaxis   Shellfish Allergy Anaphylaxis   Influenza Vaccines    Lisinopril Cough   Sildenafil Other (See Comments)    Other reaction(s): Headache, Dizziness   Topiramate Other (See Comments)    Chest spasms and numbness   Zofran [Ondansetron]    Amlodipine Other (See Comments)    LE edema   Levitra [Vardenafil] Other (See Comments)    headaches   Metformin Nausea And Vomiting   Tadalafil Other (See Comments)    dizziness    Family History  Problem Relation Age of Onset   Cancer Mother     BP 128/80 (BP Location: Left Arm, Patient Position: Sitting, Cuff Size: Normal)    Pulse 78    Ht 5\' 11"  (1.803 m)    Wt 209 lb 12.8 oz (95.2 kg)    SpO2 97%    BMI 29.26 kg/m    Review of Systems He denies hypoglycemia.      Objective:   Physical Exam    Lab Results  Component Value Date   HGBA1C 6.9 (A) 01/04/2022      Assessment & Plan:  Insulin-requiring type 2 DM: overcontrolled  Patient Instructions  Please continue the same Trulicity for now, and reduce the insulin to 24 units with breakfast.   On this type of insulin schedule, you should eat meals on a regular schedule (especially breakfast and lunch).  If a meal is missed or significantly delayed, your blood sugar could go  low.   If you run out of insulin, you should buy a bottle of 70/30 insulin at [facility].  This is not the same as what you are on, but it is close.  You should ask a friend to draw up 1 week at a time.   Your insurance won't cover the continuous glucose monitor, taking the insulin once per day.   Please come back for a follow-up appointment in 3 months.  check your blood sugar twice a day.  vary the time of day when you check, between before the 3 meals, and at bedtime.  also check if you have symptoms of your blood sugar being too high or too low.  please keep a record of the readings and bring it to your next appointment here (or you can bring the meter itself).  You can write it on any piece of paper.  please call us sooner if your blood sugar goes below 70, or if you have a lot of readings over 200.

## 2022-01-04 NOTE — Telephone Encounter (Signed)
Patient came back to office to  request to be called at ph# [phone] asap re: Continuous Glucose Meter requirement status. Patient supplied contact info for:[facility] Ph# [phone], Press Order Status

## 2022-01-06 ENCOUNTER — Ambulatory Visit (INDEPENDENT_AMBULATORY_CARE_PROVIDER_SITE_OTHER): Payer: Medicare HMO | Admitting: Podiatry

## 2022-01-06 ENCOUNTER — Other Ambulatory Visit: Payer: Self-pay

## 2022-01-06 ENCOUNTER — Encounter: Payer: Self-pay | Admitting: Podiatry

## 2022-01-06 DIAGNOSIS — E1142 Type 2 diabetes mellitus with diabetic polyneuropathy: Secondary | ICD-10-CM | POA: Diagnosis not present

## 2022-01-06 DIAGNOSIS — M792 Neuralgia and neuritis, unspecified: Secondary | ICD-10-CM

## 2022-01-06 DIAGNOSIS — B351 Tinea unguium: Secondary | ICD-10-CM | POA: Diagnosis not present

## 2022-01-06 DIAGNOSIS — M79675 Pain in left toe(s): Secondary | ICD-10-CM | POA: Diagnosis not present

## 2022-01-06 DIAGNOSIS — M79674 Pain in right toe(s): Secondary | ICD-10-CM

## 2022-01-06 NOTE — Progress Notes (Signed)
This patient returns to my office for at risk foot care.  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  Patient is taking plavix.  Patient has history of CVA.  This patient is unable to cut nails himself since the patient cannot reach his nails.These nails are painful walking and wearing shoes.  This patient presents for at risk foot care today.  General Appearance  Alert, conversant and in no acute stress.  Vascular  Dorsalis pedis and posterior tibial  pulses are weakly  palpable  bilaterally.  Capillary return is within normal limits  Bilaterally.Cold feet left.  .  Absent digital hair  B/L.  Neurologic  Senn-Weinstein monofilament wire test diminished  bilaterally. Muscle power within normal limits bilaterally.  Nails Thick disfigured discolored nails with subungual debris  from hallux to fifth toes bilaterally. No evidence of bacterial infection or drainage bilaterally.  Orthopedic  No limitations of motion  feet .  No crepitus or effusions noted.  No bony pathology or digital deformities noted.  Skin  normotropic skin with no porokeratosis noted bilaterally.  No signs of infections or ulcers noted.  Midfoot DJD right.  Hammer toes  B/L.   Onychomycosis  Pain in right toes  Pain in left toes  Consent was obtained for treatment procedures.   Mechanical debridement of nails 1-5  bilaterally performed with a nail nipper.  Filed with dremel without incident. Patient was told to use pumice stone heels  B/L and continue using vaseline.   Return office visit   3 months                   Told patient to return for periodic foot care and evaluation due to potential at risk complications.   Gardiner Barefoot DPM

## 2022-01-13 ENCOUNTER — Telehealth: Payer: Self-pay

## 2022-01-13 NOTE — Telephone Encounter (Signed)
[facility] physicians order has now been signed by the provider and faxed over for [facility]. Patient's recent chart notes faxed over as well.  Faxed to: [phone]

## 2022-01-14 ENCOUNTER — Other Ambulatory Visit: Payer: Self-pay

## 2022-01-14 DIAGNOSIS — E119 Type 2 diabetes mellitus without complications: Secondary | ICD-10-CM

## 2022-01-14 DIAGNOSIS — Z794 Long term (current) use of insulin: Secondary | ICD-10-CM

## 2022-01-14 MED ORDER — HUMALOG MIX 50/50 KWIKPEN (50-50) 100 UNIT/ML ~~LOC~~ SUPN: 24.0000 [IU] | PEN_INJECTOR | Freq: Every day | SUBCUTANEOUS | 1 refills | Status: DC

## 2022-01-25 ENCOUNTER — Ambulatory Visit (INDEPENDENT_AMBULATORY_CARE_PROVIDER_SITE_OTHER): Payer: Medicare HMO

## 2022-01-25 DIAGNOSIS — I10 Essential (primary) hypertension: Secondary | ICD-10-CM

## 2022-01-25 DIAGNOSIS — E119 Type 2 diabetes mellitus without complications: Secondary | ICD-10-CM

## 2022-01-25 DIAGNOSIS — I2510 Atherosclerotic heart disease of native coronary artery without angina pectoris: Secondary | ICD-10-CM

## 2022-01-25 DIAGNOSIS — I5042 Chronic combined systolic (congestive) and diastolic (congestive) heart failure: Secondary | ICD-10-CM

## 2022-01-25 DIAGNOSIS — Z8673 Personal history of transient ischemic attack (TIA), and cerebral infarction without residual deficits: Secondary | ICD-10-CM

## 2022-01-25 NOTE — Progress Notes (Signed)
Chronic Care Management Pharmacy Note  01/25/2022 Name:  [name] MRN:  [id number] DOB:  [birth year]  Summary: -Patient reports compliance to current medications, denies any issues with current medications  -Reports that he has not been checking BG recently, has been told tat he will qualify for CGM per [facility] but requires a letter from endocrine office  -Checking BP at home on occasion - reports BP is averaging ~130/80 -Last A1c and LDL at goal  -Per patient still following with nephrologist - no recent changes, latest labs stable   Recommendations/Changes made from today's visit: -Recommending no changes to medications at this time  -Advised for patient to reach out to endocrine office about letter to [place] for CGM (if not approved, would recommend for patient to continue fingerstick BG checks)  -Recommended for patient to check BP at least once weekly, to reach out should BP average >140/90  Plan: F/u in 6 months   Subjective: [name] is an 71 y.o. year old male who is a primary patient of Burns, Claudina Lick, MD.  The CCM team was consulted for assistance with disease management and care coordination needs.    Engaged with patient by telephone for follow up visit in response to provider referral for pharmacy case management and/or care coordination services.   Consent to Services:  The patient was given the following information about Chronic Care Management services today, agreed to services, and gave verbal consent: 1. CCM service includes personalized support from designated clinical staff supervised by the primary care provider, including individualized plan of care and coordination with other care providers 2. 24/7 contact phone numbers for assistance for urgent and routine care needs. 3. Service will only be billed when office clinical staff spend 20 minutes or more in a month to coordinate care. 4. Only one practitioner may furnish and bill the service  in a calendar month. 5.The patient may stop CCM services at any time (effective at the end of the month) by phone call to the office staff. 6. The patient will be responsible for cost sharing (co-pay) of up to 20% of the service fee (after annual deductible is met). Patient agreed to services and consent obtained.  Patient Care Team: Binnie Rail, MD as PCP - General (Internal Medicine) Skeet Latch, MD as PCP - Cardiology (Cardiology) Nahser, Wonda Cheng, MD (Cardiology) Renato Shin, MD (Endocrinology) Izora Gala, MD (Otolaryngology) Garvin Fila, MD (Neurology) Almedia Balls, MD (Orthopedic Surgery) Sable Feil, MD (Gastroenterology) Wynona Canes, MD as Consulting Physician (Neurology) Tomasa Blase, [facility] (Pharmacist)  Recent office visits: 12/14/2021 - Dr. Quay Burow - hospital f/u - gastroenteritis - no medication changes  11/06/2021 - Dr. Quay Burow - UTI/ prostatitis / sinus infection - rx'd doxycycline   Recent consult visits: 01/06/2022 - Dr. Prudence Davidson -Podiatry - follow up in 3 months  01/04/2022 - Dr. Loanne Drilling - Endocrinology - reduce insulin to 24 units daily  11/03/2021 - Dr. Loanne Drilling - Endocrinology - reduce insulin to 26 units daily  10/23/2021 - Dr[facility] Rondell Reams [facility] - no changes to medications  09/30/2021 - Dr. Prudence Davidson - Podiatry - f/u in 3 months  09/01/2021 - Dr. Loanne Drilling - Endocrinology - reduce insulin to 28 units daily  08/03/2021 - Dr. Oval Linsey - Cardiology - continue current medications   Hospital visits: 12/08/2021 - ER visit nausea and vomiting / chest pain which improved with tums - rx'd zofran   Objective:  Lab Results  Component Value Date   CREATININE 2.09 (  H) 12/08/2021   BUN 34 (H) 12/08/2021   GFR 41.31 (L) 04/15/2020   GFRNONAA 33 (L) 12/08/2021   GFRAA 32 (L) 09/10/2020   NA 138 12/08/2021   K 3.8 12/08/2021   CALCIUM 9.2 12/08/2021   CO2 26 12/08/2021   GLUCOSE 144 (H) 12/08/2021    Lab Results  Component Value Date/Time   HGBA1C 6.9 (A)  01/04/2022 08:57 AM   HGBA1C 6.8 (A) 11/03/2021 10:44 AM   HGBA1C 7.7 (H) 08/28/2018 10:12 AM   HGBA1C 9.3 (H) 02/24/2018 11:09 AM   FRUCTOSAMINE 371 (H) 02/05/2021 02:08 PM   FRUCTOSAMINE 435 (H) 04/15/2017 09:34 AM   GFR 41.31 (L) 04/15/2020 11:28 AM   GFR 48.79 (L) 10/17/2019 01:29 PM   MICROALBUR 7.5 (H) 08/17/2016 10:30 AM   MICROALBUR 9.6 (H) 08/07/2015 11:17 AM    Last diabetic Eye exam:  Lab Results  Component Value Date/Time   HMDIABEYEEXA Retinopathy (A) 09/23/2017 12:00 AM    Last diabetic Foot exam:  Lab Results  Component Value Date/Time   HMDIABFOOTEX done 11/30/2019 12:00 AM     Lab Results  Component Value Date   CHOL 112 04/15/2020   HDL 44.60 04/15/2020   LDLCALC 40 04/15/2020   LDLDIRECT 63.0 01/12/2016   TRIG 136.0 04/15/2020   CHOLHDL 3 04/15/2020    Hepatic Function Latest Ref Rng & Units 12/08/2021 07/07/2020 04/15/2020  Total Protein 6.5 - 8.1 g/dL 7.4 8.2(H) 7.9  Albumin 3.5 - 5.0 g/dL 3.8 4.3 4.3  AST 15 - 41 U/L _0 ALT 0 - 44 U/L _1 Alk Phosphatase 38 - 126 U/L 54 58 74  Total Bilirubin 0.3 - 1.2 mg/dL 1.1 1.0 0.9  Bilirubin, Direct 0.0 - 0.2 mg/dL 0.2 - -    Lab Results  Component Value Date/Time   TSH 2.08 10/17/2019 01:29 PM   TSH 1.55 02/24/2018 11:09 AM    CBC Latest Ref Rng & Units 12/08/2021 07/10/2020 07/07/2020  WBC 4.0 - 10.5 K/uL 5.8 5.1 6.8  Hemoglobin 13.0 - 17.0 g/dL 11.3(L) 9.7(L) 11.3(L)  Hematocrit 39.0 - 52.0 % 34.7(L) 29.3(L) 34.5(L)  Platelets 150 - 400 K/uL 178 190 218    No results found for: VD25OH  Clinical ASCVD: Yes  The ASCVD Risk score (Arnett DK, et al., 2019) failed to calculate for the following reasons:   The patient has a prior MI or stroke diagnosis    Depression screen [facility] 2/9 01/28/2021 10/17/2019 02/24/2018  Decreased Interest 0 0 0  Down, Depressed, Hopeless 0 0 1  PHQ - 2 Score 0 0 1  Altered sleeping - - 0  Tired, decreased energy - - 1  Change in appetite - - 0  Feeling bad or  failure about yourself  - - 0  Trouble concentrating - - 0  Moving slowly or fidgety/restless - - 0  Suicidal thoughts - - 0  PHQ-9 Score - - 2  Difficult doing work/chores - - Not difficult at all  Some recent data might be hidden     Social History   Tobacco Use  Smoking Status Former   Packs/day: 2.50   Years: 9.00   Pack years: 22.50   Types: Cigarettes   Quit date: 02/24/1983   Years since quitting: 38.9  Smokeless Tobacco Never   BP Readings from Last 3 Encounters:  01/04/22 128/80  12/14/21 (!) 142/80  12/08/21 132/80   Pulse Readings from Last 3 Encounters:  01/04/22 78  12/14/21 80  12/08/21 73   Wt Readings from Last 3 Encounters:  01/04/22 209 lb 12.8 oz (95.2 kg)  12/14/21 208 lb 9.6 oz (94.6 kg)  12/08/21 214 lb (97.1 kg)   BMI Readings from Last 3 Encounters:  01/04/22 29.26 kg/m  12/14/21 29.09 kg/m  12/08/21 29.85 kg/m    Assessment/Interventions: Review of patient past medical history, allergies, medications, health status, including review of consultants reports, laboratory and other test data, was performed as part of comprehensive evaluation and provision of chronic care management services.   SDOH:  (Social Determinants of Health) assessments and interventions performed: Yes  SDOH Screenings   Alcohol Screen: Low Risk    Last Alcohol Screening Score (AUDIT): 0  Depression (PHQ2-9): Low Risk    PHQ-2 Score: 0  Financial Resource Strain: Low Risk    Difficulty of Paying Living Expenses: Not hard at all  Food Insecurity: No Food Insecurity   Worried About Charity fundraiser in the Last Year: Never true   Ran Out of Food in the Last Year: Never true  Housing: Low Risk    Last Housing Risk Score: 0  Physical Activity: Inactive   Days of Exercise per Week: 0 days   Minutes of Exercise per Session: 0 min  Social Connections: Moderately Integrated   Frequency of Communication with Friends and Family: More than three times a week    Frequency of Social Gatherings with Friends and Family: More than three times a week   Attends Religious Services: 1 to 4 times per year   Active Member of [employer] or Organizations: No   Attends Music therapist: 1 to 4 times per year   Marital Status: Never married  Stress: No Stress Concern Present   Feeling of Stress : Not at all  Tobacco Use: Medium Risk   Smoking Tobacco Use: Former   Smokeless Tobacco Use: Never   Passive Exposure: Not on file  Transportation Needs: No Transportation Needs   Lack of Transportation (Medical): No   Lack of Transportation (Non-Medical): No    CCM Care Plan  Allergies  Allergen Reactions   Penicillins Anaphylaxis   Pneumococcal Vaccines Anaphylaxis   Shellfish Allergy Anaphylaxis   Influenza Vaccines    Lisinopril Cough   Sildenafil Other (See Comments)    Other reaction(s): Headache, Dizziness   Topiramate Other (See Comments)    Chest spasms and numbness   Zofran [Ondansetron]    Amlodipine Other (See Comments)    LE edema   Levitra [Vardenafil] Other (See Comments)    headaches   Metformin Nausea And Vomiting   Tadalafil Other (See Comments)    dizziness    Medications Reviewed Today     Reviewed by Tomasa Blase, [facility] (Pharmacist) on 01/25/22 at 1004  Med List Status: <None>   Medication Order Taking? Sig Documenting Provider Last Dose Status Informant  amLODipine (NORVASC) 5 MG tablet [id number] Yes Take 5 mg by mouth daily. [provider] Taking Active Self  aspirin EC 81 MG tablet [id number] Yes Take 1 tablet (81 mg total) by mouth daily. Swallow whole. Charlie Pitter, PA-C Taking Active Self  atorvastatin (LIPITOR) 40 MG tablet [id number] Yes TAKE 1 TABLET EVERY DAY Skeet Latch, MD Taking Active Self  Blood Pressure Monitoring (BLOOD PRESSURE CUFF) MISC [id number] Yes 1 Units by Does not apply route daily. Skeet Latch, MD Taking Active Self  calcium carbonate (TUMS - DOSED IN MG ELEMENTAL  CALCIUM) 500 MG chewable tablet [id number] Yes Chew 1  tablet by mouth daily as needed for indigestion or heartburn. [provider] Taking Active Self  carvedilol (COREG) 12.5 MG tablet [id number] Yes TAKE 1 AND 1/2 TABLETS TWICE DAILY Skeet Latch, MD Taking Active Self  chlorthalidone (HYGROTON) 25 MG tablet [id number] Yes Take 1 tablet (25 mg total) by mouth daily. Skeet Latch, MD Taking Active Self  clopidogrel (PLAVIX) 75 MG tablet [id number] Yes Take 1 tablet (75 mg total) by mouth daily. Please schedule annual appt in April for refills. Thank you Skeet Latch, MD Taking Active Self  Continuous Blood Gluc Sensor (FREESTYLE LIBRE 2 SENSOR) MISC [id number]  1 Device by Does not apply route every 14 (fourteen) days. Renato Shin, MD  Active Self  Dulaglutide (TRULICITY) 2.42 PN/3.6RW [place] Aid [id number] Yes Inject 0.5 mLs (0.75 mg total) into the skin once a week. Renato Shin, MD Taking Active Self           Med Note Nigel Mormon, Mickel Crow   Tue Dec 08, 2021  1:12 PM) Every sunday  furosemide (LASIX) 40 MG tablet [id number]  TAKE 1 TABLET (40 MG TOTAL) BY MOUTH DAILY. NEED APPT. Skeet Latch, MD  Active Self           Med Note Nigel Mormon, Mickel Crow   Tue Dec 08, 2021  1:12 PM) prn  glucose blood (ACCU-CHEK AVIVA PLUS) test strip [id number] Yes uad tid Binnie Rail, MD Taking Active   Insulin Lispro Prot & Lispro (HUMALOG MIX 50/50 KWIKPEN) (50-50) 100 UNIT/ML Claiborne Rigg [id number] Yes Inject 24 Units into the skin daily with breakfast. Renato Shin, MD Taking Active   Insulin Pen Needle (UNIFINE PENTIPS) 31G X 6 MM MISC [id number] Yes Use once daily Binnie Rail, MD Taking Active   losartan (COZAAR) 100 MG tablet [id number] Yes TAKE 1 TABLET EVERY DAY Skeet Latch, MD Taking Active Self  Microlet Lancets MISC [id number] Yes UAD to check sugars TID.  E11.22 Binnie Rail, MD Taking Active   nitroGLYCERIN (NITROSTAT) 0.4 MG SL tablet [id number]  Place 1 tablet (0.4 mg total)  under the tongue every 5 (five) minutes as needed for chest pain (up to 3 doses). Charlie Pitter, PA-C  Expired 07/10/21 2359 Self           Med Note Nigel Mormon, KIMBERLY L   Tue Dec 08, 2021  1:13 PM) Has on hand  senna-docusate (SENOKOT-S) 8.6-50 MG per tablet [id number] Yes Take 2 tablets by mouth 2 (two) times daily. For constipation. Bary Leriche, [place] Taking Active Self            Patient Active Problem List   Diagnosis Date Noted   Ganglion cyst of wrist, left 12/14/2021   Viral gastroenteritis 12/14/2021   Urinary frequency 11/06/2021   Coronary artery disease 03/25/2021   Hoarse 03/25/2021   Major depressive disorder, recurrent episode, moderate ([place]) 03/25/2021   Major depressive disorder, single episode 03/25/2021   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   Observation and evaluation for other specified suspected conditions 03/25/2021   Vocal cord paralysis 03/25/2021   Diabetic peripheral neuropathy ([place]) 12/25/2020   Orthopnea 12/16/2020   OSA on CPAP 09/09/2020   Unstable angina ([place]) 07/09/2020   Sciatica of left side 07/11/2019   Cough 07/11/2019   Edema leg 10/26/2018   History of stroke 10/26/2018   DOE (dyspnea on exertion) 10/26/2018   Left hip pain 07/14/2018   Acute left ankle pain 07/14/2018   Chest pain 07/14/2018   Acute non-recurrent ethmoidal sinusitis 03/13/2018   Nonintractable headache  02/24/2018   CRI (chronic renal insufficiency), stage 3 (moderate) 02/22/2017   Anemia 02/16/2017   Chronic combined systolic and diastolic heart failure ([place]) 06/29/2016   LBBB (left bundle branch block) 02/02/2016   Nonallopathic lesion of lumbosacral region 12/11/2014   Nonallopathic lesion of sacral region 12/11/2014   Nonallopathic lesion of thoracic region 12/11/2014   Arthritis of left hip 11/20/2014   Ischial bursitis of left side 10/28/2014   Hamstring tightness of left lower extremity 09/16/2014   Piriformis syndrome of left side 09/16/2014   Dysphonia  06/22/2013   Hemiparesis affecting left side as late effect of stroke ([place]) 10/10/2012   Dysphagia following cerebrovascular accident 08/14/2012   Chronic back pain    Peripheral neuropathy ([place])    TIA (transient ischemic attack) 04/08/2012   Stroke ([place]) 02/21/2012   MORTON'S NEUROMA, RIGHT 05/29/2010   Diabetes ([place]) 04/13/2010   ALLERGIC RHINITIS 04/13/2010   GERD 04/13/2010   ERECTILE DYSFUNCTION 03/12/2009   Essential hypertension 03/12/2009   CAD S/P percutaneous coronary angioplasty 03/12/2009   Hyperlipidemia 03/07/2009    Immunization History  Administered Date(s) Administered   PFIZER(Purple Top)SARS-COV-2 Vaccination 03/01/2020, 03/26/2020, 10/14/2020   Td 11/22/2004   Tdap 11/30/2011   Tetanus 07/13/2012    Conditions to be addressed/monitored:  Hypertension, Hyperlipidemia, Diabetes, Heart Failure, Coronary Artery Disease, and Chronic Kidney Disease  Care Plan : [place]  Updates made by Tomasa Blase, RPH since 01/25/2022 12:00 AM     Problem: Hypertension, Hyperlipidemia, Diabetes, Heart Failure, Coronary Artery Disease, and Chronic Kidney Disease   Priority: High     Long-Range Goal: Disease management   Start Date: 06/05/2021  Expected End Date: 01/26/2023  This Visit's Progress: On track  Recent Progress: On track  Priority: High  Note:   Current Barriers:  Unable to independently monitor therapeutic efficacy  Pharmacist Clinical Goal(s):  Patient will achieve adherence to monitoring guidelines and medication adherence to achieve therapeutic efficacy through collaboration with PharmD and provider.   Interventions: 1:1 collaboration with Binnie Rail, MD regarding development and update of comprehensive plan of care as evidenced by provider attestation and co-signature Inter-disciplinary care team collaboration (see longitudinal plan of care) Comprehensive medication review performed; medication list updated in electronic medical  record  Heart Failure / Hypertension    HF Type: Combined Systolic and Diastolic Last ejection [id number]:56% (07/29/2020, improved) BP goal is:  <140/90   Patient checks BP at home: infrequently Patient home BP readings are ranging: reports ~130/80   Patient has failed these meds in past: atenolol Patient is currently controlled on the following medications: Losartan 100 mg daily Furosemide 40 mg daily PRN Chlorthalidone 25 mg daily Amlodipine 5 mg daily   Carvedilol 12.59m - 1.5 tablets twice daily    We discussed: Pt reports compliance with meds above - denies any issues with current medications  -discussed importance of maintaining BP at goal to prevent further kidney damage; discussed substances that can increase BP (caffeine, tobacco, steroids) and benefits of exercise    Plan: Continue current medications - check BP at least once weekly    Hyperlipidemia / CAD    LDL goal < 70 Lab Results  Component Value Date   LMar[place]40 04/15/2020  CAD - NSTEMI 06/2020 w/ DES; BMS 2001, DES 2005; stroke 2013 Managed per Dr ROval Linsey- cont indefinite DAPT.   Patient has failed these meds in past: n/a Patient is currently controlled on the following medications: Atorvastatin 40 mg daily Clopidogrel 75 mg  daily HS Aspirin 81 mg daily Nitroglycerin 0.4 mg SL prn   We discussed:  LDL is at goal; pt endorses adherence and denies side effects    Plan: Continue current medications and control with diet and exercise   Diabetes    A1c goal < 7% without hypoglycemia Lab Results  Component Value Date   HGBA1C 6.9 (A) 01/04/2022  Checking BG: 2x per Day Recent FBG Readings: n/a - patient has not been checking recently as he is waiting for CGM approval by [facility]    Patient has failed these meds in past: n/a Patient is currently uncontrolled on the following medications: Trulicity 3.00 mg once weekly  Humalog mix 50/50 24 units with breakfast   We discussed: patient to reach out  to endo office about CGM - if not approved, would recommend for patient to continue with fingerstick BG checks BID    Plan: Continue current medications     CKD Stage 4  Managed per Dr. Joylene Grapes.   We discussed:  importance of controlling BP and DM to prevent further kidney damage  Plan: Continue current medications  Patient Goals/Self-Care Activities Patient will:  - take medications as prescribed focus on medication adherence by pill box check glucose daily, document, and provide at future appointments check blood pressure at least once weekly, document, and provide at future appointments collaborate with provider on medication access solutions       Medication Assistance: None required.  Patient affirms current coverage meets needs.  Care Gaps: Shingrix  COVID booster  Ophthalmology exam   Patient's preferred pharmacy is:  [facility], [address] [place] [place] [address] Phone: [phone] Fax: [phone]  [place] [facility] [facility] [place] [address] Phone: [phone] Fax: [phone]  [facility], [address] [place] [address] Phone: [phone] Fax: [phone]   Uses pill box? No - keeps bottle in container seperated by day / night Pt endorses 100% compliance  Care Plan and Follow Up Patient Decision:  Patient agrees to Care Plan and Follow-up.  Plan: Telephone follow up appointment with care management team member scheduled for:  6 months  Tomasa Blase, PharmD Clinical Pharmacist, [place]

## 2022-01-25 NOTE — Patient Instructions (Signed)
Visit Information ? ?Following are the goals we discussed today:  ? ?Manage My Medicine  ? ?Timeframe:  Long-Range Goal ?Priority:  High ?Start Date:    06/05/21                         ?Expected End Date: 01/26/2023                    ? ?Follow Up Date Sept 2023 ?  ?- call for medicine refill 2 or 3 days before it runs out ?- call if I am sick and can't take my medicine ?- keep a list of all the medicines I take; vitamins and herbals too ?- use a pillbox to sort medicine  ?-contact Endocrine office about status of continue glucose monitor / refills for fingerstick device  ?  ?Why is this important?   ?These steps will help you keep on track with your medicines. ?  ? ?Plan: Telephone follow up appointment with care management team member scheduled for:  6 months ?The patient has been provided with contact information for the care management team and has been advised to call with any health related questions or concerns.  ? ?Tomasa Blase, PharmD ?Clinical Pharmacist, [place]  ? ?Please call the care guide team at [phone] if you need to cancel or reschedule your appointment.  ? ?Patient verbalizes understanding of instructions and care plan provided today and agrees to view in [place]. Active MyChart status confirmed with patient.   ? ?

## 2022-02-03 ENCOUNTER — Other Ambulatory Visit: Payer: Self-pay | Admitting: Cardiovascular Disease

## 2022-02-03 NOTE — Telephone Encounter (Signed)
Rx(s) sent to pharmacy electronically.  

## 2022-02-12 ENCOUNTER — Ambulatory Visit: Payer: Medicare HMO

## 2022-02-12 ENCOUNTER — Other Ambulatory Visit: Payer: Self-pay

## 2022-02-19 DIAGNOSIS — Z794 Long term (current) use of insulin: Secondary | ICD-10-CM

## 2022-02-19 DIAGNOSIS — I130 Hypertensive heart and chronic kidney disease with heart failure and stage 1 through stage 4 chronic kidney disease, or unspecified chronic kidney disease: Secondary | ICD-10-CM

## 2022-02-19 DIAGNOSIS — I2510 Atherosclerotic heart disease of native coronary artery without angina pectoris: Secondary | ICD-10-CM

## 2022-02-19 DIAGNOSIS — Z9861 Coronary angioplasty status: Secondary | ICD-10-CM

## 2022-02-19 DIAGNOSIS — N184 Chronic kidney disease, stage 4 (severe): Secondary | ICD-10-CM | POA: Diagnosis not present

## 2022-02-19 DIAGNOSIS — I5040 Unspecified combined systolic (congestive) and diastolic (congestive) heart failure: Secondary | ICD-10-CM

## 2022-02-19 DIAGNOSIS — E1122 Type 2 diabetes mellitus with diabetic chronic kidney disease: Secondary | ICD-10-CM | POA: Diagnosis not present

## 2022-02-22 ENCOUNTER — Ambulatory Visit: Payer: Medicare HMO

## 2022-02-22 ENCOUNTER — Telehealth: Payer: Self-pay

## 2022-02-22 NOTE — Telephone Encounter (Signed)
Called patient states he is unable to complete [place] he is on the phone with insurance at this time request to reschedule . ? ?L.Rilla Buckman,LPN  ?

## 2022-02-22 NOTE — Progress Notes (Signed)
? ? ?Chronic Care Management ?Pharmacy Assistant  ? ?Name: [name]  MRN: [id number] DOB: 12/19/1950 ? ?[name] is an 71 y.o. year old male who was called for assessment follow up call. ?Patient has appt with clinical pharmacist 07/27/22 @ 9AM ? ?Reason for Encounter: Disease State ?  ?Conditions to be addressed/monitored: ?DMII ? ? ?Recent office visits:  ?None ID  ? ?Recent consult visits:  ?None ID ? ?Hospital visits:  ?None in previous 6 months ? ?Medications: ?Outpatient Encounter Medications as of 02/22/2022  ?Medication Sig Note  ? amLODipine (NORVASC) 5 MG tablet Take 5 mg by mouth daily.   ? aspirin EC 81 MG tablet Take 1 tablet (81 mg total) by mouth daily. Swallow whole.   ? atorvastatin (LIPITOR) 40 MG tablet TAKE 1 TABLET EVERY DAY   ? Blood Pressure Monitoring (BLOOD PRESSURE CUFF) MISC 1 Units by Does not apply route daily.   ? calcium carbonate (TUMS - DOSED IN MG ELEMENTAL CALCIUM) 500 MG chewable tablet Chew 1 tablet by mouth daily as needed for indigestion or heartburn.   ? carvedilol (COREG) 12.5 MG tablet TAKE 1 AND 1/2 TABLETS TWICE DAILY   ? chlorthalidone (HYGROTON) 25 MG tablet TAKE 1 TABLET EVERY DAY   ? clopidogrel (PLAVIX) 75 MG tablet Take 1 tablet (75 mg total) by mouth daily.   ? Continuous Blood Gluc Sensor (FREESTYLE LIBRE 2 SENSOR) MISC 1 Device by Does not apply route every 14 (fourteen) days.   ? Dulaglutide (TRULICITY) 7.89 FY/1.0FB SOPN Inject 0.5 mLs (0.75 mg total) into the skin once a week. 12/08/2021: Every sunday  ? furosemide (LASIX) 40 MG tablet TAKE 1 TABLET (40 MG TOTAL) BY MOUTH DAILY. NEED APPT. 12/08/2021: prn  ? glucose blood (ACCU-CHEK AVIVA PLUS) test strip uad tid   ? Insulin Lispro Prot & Lispro (HUMALOG MIX 50/50 KWIKPEN) (50-50) 100 UNIT/ML Kwikpen Inject 24 Units into the skin daily with breakfast.   ? Insulin Pen Needle (UNIFINE PENTIPS) 31G X 6 MM MISC Use once daily   ? losartan (COZAAR) 100 MG tablet TAKE 1 TABLET EVERY DAY   ? Microlet  Lancets MISC UAD to check sugars TID.  E11.22   ? nitroGLYCERIN (NITROSTAT) 0.4 MG SL tablet Place 1 tablet (0.4 mg total) under the tongue every 5 (five) minutes as needed for chest pain (up to 3 doses). 12/08/2021: Has on hand  ? senna-docusate (SENOKOT-S) 8.6-50 MG per tablet Take 2 tablets by mouth 2 (two) times daily. For constipation.   ? ?No facility-administered encounter medications on file as of 02/22/2022.  ? ?Recent Relevant Labs: ?Lab Results  ?Component Value Date/Time  ? HGBA1C 6.9 (A) 01/04/2022 08:57 AM  ? HGBA1C 6.8 (A) 11/03/2021 10:44 AM  ? HGBA1C 7.7 (H) 08/28/2018 10:12 AM  ? HGBA1C 9.3 (H) 02/24/2018 11:09 AM  ? MICROALBUR 7.5 (H) 08/17/2016 10:30 AM  ? MICROALBUR 9.6 (H) 08/07/2015 11:17 AM  ?  ?Kidney Function ?Lab Results  ?Component Value Date/Time  ? CREATININE 2.09 (H) 12/08/2021 10:25 AM  ? CREATININE 2.29 (H) 09/10/2020 02:37 PM  ? GFR 41.31 (L) 04/15/2020 11:28 AM  ? GFRNONAA 33 (L) 12/08/2021 10:25 AM  ? GFRAA 32 (L) 09/10/2020 02:37 PM  ? ? ?Current antihyperglycemic regimen:  ?Trulicity 5.10 mg once weekly (pt states that he is not taking) ?Humalog mix 50/50 24 units with breakfast ? ?What recent interventions/DTPs have been made to improve glycemic control:  ?None noted ? ?Have there been any recent hospitalizations or ED  visits since last visit with [facility]? No ? ?Patient denies hypoglycemic symptoms, including None ? ?Patient denies hyperglycemic symptoms, including none ? ?How often are you checking your blood sugar? Patient states that he has not checked blood sugar because he does not have an meter. He stated that he has tried to get the [place] sensor for a while now but has not heard anything else about it. He states that he would like some help in trying to get the sensor or a glucose meter so that he can start checking his levels ? ?What are your blood sugars ranging? Patient has not taken sugar levels ? ?During the week, how often does your blood glucose drop below 70?  Patient  dose not believe his blood sugar had been below 70 ? ?Are you checking your feet daily/regularly? Patient states that he has some pain in his feet when he walks, no swelling, open sores or numbness. Patient states that he sees a podiatrist  ? ?Adherence Review: ?Is the patient currently on a STATIN medication? Yes ?Is the patient currently on ACE/ARB medication? Yes ?Does the patient have >5 day gap between last estimated fill dates? No ? ?Care Gaps: ?Colonoscopy - 01/02/2021 ?Diabetic Foot Exam - 01/06/22 ?Mammogram - NA ?Ophthalmology - 09/23/2017 ?Dexa Scan - NA ?Annual Well Visit - 01/28/2021 ?Micro albumin - NA ?Hemoglobin A1c - 01/04/22 ? ?Star Rating Drugs: ?Atorvastatin - filled 01/23/22 90 DS ?Trulicity - filled 11/22/73 84D (Patient not taking) ?Losartan - fill 12/25/21 90 DS ? ?Ethelene Hal ?Clinical Pharmacist Assistant ?[phone]  ?

## 2022-03-05 ENCOUNTER — Ambulatory Visit (INDEPENDENT_AMBULATORY_CARE_PROVIDER_SITE_OTHER): Payer: Medicare HMO

## 2022-03-05 DIAGNOSIS — Z0000 Encounter for general adult medical examination without abnormal findings: Secondary | ICD-10-CM | POA: Diagnosis not present

## 2022-03-05 NOTE — Patient Instructions (Signed)
[name] , ?Thank you for taking time to come for your Medicare Wellness Visit. I appreciate your ongoing commitment to your health goals. Please review the following plan we discussed and let me know if I can assist you in the future.  ? ?Screening recommendations/referrals: ?Colonoscopy: Cologuard 01/02/2021 ?Recommended yearly ophthalmology/optometry visit for glaucoma screening and checkup ?Recommended yearly dental visit for hygiene and checkup ? ?Vaccinations: ?Influenza vaccine: declined  ?Pneumococcal vaccine: declined  ?Tdap vaccine: 07/13/2012 ?Shingles vaccine: will consider    ? ?Advanced directives: none  ? ?Conditions/risks identified: none  ? ?Next appointment: none  ? ?Preventive Care 48 Years and Older, Male ?Preventive care refers to lifestyle choices and visits with your health care provider that can promote health and wellness. ?What does preventive care include? ?A yearly physical exam. This is also called an annual well check. ?Dental exams once or twice a year. ?Routine eye exams. Ask your health care provider how often you should have your eyes checked. ?Personal lifestyle choices, including: ?Daily care of your teeth and gums. ?Regular physical activity. ?Eating a healthy diet. ?Avoiding tobacco and drug use. ?Limiting alcohol use. ?Practicing safe sex. ?Taking low doses of aspirin every day. ?Taking vitamin and mineral supplements as recommended by your health care provider. ?What happens during an annual well check? ?The services and screenings done by your health care provider during your annual well check will depend on your age, overall health, lifestyle risk factors, and family history of disease. ?Counseling  ?Your health care provider may ask you questions about your: ?Alcohol use. ?Tobacco use. ?Drug use. ?Emotional well-being. ?Home and relationship well-being. ?Sexual activity. ?Eating habits. ?History of falls. ?Memory and ability to understand (cognition). ?Work and work  Statistician. ?Screening  ?You may have the following tests or measurements: ?Height, weight, and BMI. ?Blood pressure. ?Lipid and cholesterol levels. These may be checked every 5 years, or more frequently if you are over 22 years old. ?Skin check. ?Lung cancer screening. You may have this screening every year starting at age 96 if you have a 30-pack-year history of smoking and currently smoke or have quit within the past 15 years. ?Fecal occult blood test (FOBT) of the stool. You may have this test every year starting at age 98. ?Flexible sigmoidoscopy or colonoscopy. You may have a sigmoidoscopy every 5 years or a colonoscopy every 10 years starting at age 19. ?Prostate cancer screening. Recommendations will vary depending on your family history and other risks. ?Hepatitis C blood test. ?Hepatitis B blood test. ?Sexually transmitted disease (STD) testing. ?Diabetes screening. This is done by checking your blood sugar (glucose) after you have not eaten for a while (fasting). You may have this done every 1-3 years. ?Abdominal aortic aneurysm (AAA) screening. You may need this if you are a current or former smoker. ?Osteoporosis. You may be screened starting at age 54 if you are at high risk. ?Talk with your health care provider about your test results, treatment options, and if necessary, the need for more tests. ?Vaccines  ?Your health care provider may recommend certain vaccines, such as: ?Influenza vaccine. This is recommended every year. ?Tetanus, diphtheria, and acellular pertussis (Tdap, Td) vaccine. You may need a Td booster every 10 years. ?Zoster vaccine. You may need this after age 40. ?Pneumococcal 13-valent conjugate (PCV13) vaccine. One dose is recommended after age 70. ?Pneumococcal polysaccharide (PPSV23) vaccine. One dose is recommended after age 5. ?Talk to your health care provider about which screenings and vaccines you need and how often  you need them. ?This information is not intended to replace  advice given to you by your health care provider. Make sure you discuss any questions you have with your health care provider. ?Document Released: 12/05/2015 Document Revised: 07/28/2016 Document Reviewed: 09/09/2015 ?Elsevier Interactive Patient Education ? 2017 [place]. ? ?Fall Prevention in the Home ?Falls can cause injuries. They can happen to people of all ages. There are many things you can do to make your home safe and to help prevent falls. ?What can I do on the outside of my home? ?Regularly fix the edges of walkways and driveways and fix any cracks. ?Remove anything that might make you trip as you walk through a door, such as a raised step or threshold. ?Trim any bushes or trees on the path to your home. ?Use bright outdoor lighting. ?Clear any walking paths of anything that might make someone trip, such as rocks or tools. ?Regularly check to see if handrails are loose or broken. Make sure that both sides of any steps have handrails. ?Any raised decks and porches should have guardrails on the edges. ?Have any leaves, snow, or ice cleared regularly. ?Use sand or salt on walking paths during winter. ?Clean up any spills in your garage right away. This includes oil or grease spills. ?What can I do in the bathroom? ?Use night lights. ?Install grab bars by the toilet and in the tub and shower. Do not use towel bars as grab bars. ?Use non-skid mats or decals in the tub or shower. ?If you need to sit down in the shower, use a plastic, non-slip stool. ?Keep the floor dry. Clean up any water that spills on the floor as soon as it happens. ?Remove soap buildup in the tub or shower regularly. ?Attach bath mats securely with double-sided non-slip rug tape. ?Do not have throw rugs and other things on the floor that can make you trip. ?What can I do in the bedroom? ?Use night lights. ?Make sure that you have a light by your bed that is easy to reach. ?Do not use any sheets or blankets that are too big for your bed.  They should not hang down onto the floor. ?Have a firm chair that has side arms. You can use this for support while you get dressed. ?Do not have throw rugs and other things on the floor that can make you trip. ?What can I do in the kitchen? ?Clean up any spills right away. ?Avoid walking on wet floors. ?Keep items that you use a lot in easy-to-reach places. ?If you need to reach something above you, use a strong step stool that has a grab bar. ?Keep electrical cords out of the way. ?Do not use floor polish or wax that makes floors slippery. If you must use wax, use non-skid floor wax. ?Do not have throw rugs and other things on the floor that can make you trip. ?What can I do with my stairs? ?Do not leave any items on the stairs. ?Make sure that there are handrails on both sides of the stairs and use them. Fix handrails that are broken or loose. Make sure that handrails are as long as the stairways. ?Check any carpeting to make sure that it is firmly attached to the stairs. Fix any carpet that is loose or worn. ?Avoid having throw rugs at the top or bottom of the stairs. If you do have throw rugs, attach them to the floor with carpet tape. ?Make sure that you have a light  switch at the top of the stairs and the bottom of the stairs. If you do not have them, ask someone to add them for you. ?What else can I do to help prevent falls? ?Wear shoes that: ?Do not have high heels. ?Have rubber bottoms. ?Are comfortable and fit you well. ?Are closed at the toe. Do not wear sandals. ?If you use a stepladder: ?Make sure that it is fully opened. Do not climb a closed stepladder. ?Make sure that both sides of the stepladder are locked into place. ?Ask someone to hold it for you, if possible. ?Clearly mark and make sure that you can see: ?Any grab bars or handrails. ?First and last steps. ?Where the edge of each step is. ?Use tools that help you move around (mobility aids) if they are needed. These  include: ?Canes. ?Walkers. ?Scooters. ?Crutches. ?Turn on the lights when you go into a dark area. Replace any light bulbs as soon as they burn out. ?Set up your furniture so you have a clear path. Avoid moving your furniture aroun

## 2022-03-05 NOTE — Progress Notes (Signed)
? ?Subjective:  ? Mike Keech is a 71 y.o. male who presents for an Initial Medicare Annual Wellness Visit. ? ?I connected with [name] today by telephone and verified that I am speaking with the correct person using two identifiers. ?Location patient: home ?Location provider: work ?Persons participating in the virtual visit: patient, provider. ?  ?I discussed the limitations, risks, security and privacy concerns of performing an evaluation and management service by telephone and the availability of in person appointments. I also discussed with the patient that there may be a patient responsible charge related to this service. The patient expressed understanding and verbally consented to this telephonic visit.  ?  ?Interactive audio and video telecommunications were attempted between this provider and patient, however failed, due to patient having technical difficulties OR patient did not have access to video capability.  We continued and completed visit with audio only. ? ?  ?Review of Systems    ? ?Cardiac Risk Factors include: advanced age (>51mn, >>33women);diabetes mellitus;male gender;dyslipidemia;hypertension ? ?   ?Objective:  ?  ?Today's Vitals  ? ?There is no height or weight on file to calculate BMI. ? ? ?  03/05/2022  ?  1:12 PM 12/08/2021  ? 10:19 AM 01/28/2021  ?  9:36 AM 12/16/2020  ?  7:54 PM 07/10/2020  ?  5:10 AM 07/07/2020  ? 11:57 AM 02/24/2018  ? 10:34 AM  ?Advanced Directives  ?Does Patient Have a Medical Advance Directive? No No No Yes No No No  ?Type of ATheatre stage managerof AFreder[place]Living will     ?Does patient want to make changes to medical advance directive? No - Patient declined   No - Patient declined     ?Copy of HUraniain Chart?    No - copy requested     ?Would patient like information on creating a medical advance directive?  No - Patient declined No - Patient declined  No - Patient declined  Yes (ED - Information included in AVS)   ? ? ?Current Medications (verified) ?Outpatient Encounter Medications as of 03/05/2022  ?Medication Sig  ? amLODipine (NORVASC) 5 MG tablet Take 5 mg by mouth daily.  ? aspirin EC 81 MG tablet Take 1 tablet (81 mg total) by mouth daily. Swallow whole.  ? atorvastatin (LIPITOR) 40 MG tablet TAKE 1 TABLET EVERY DAY  ? Blood Pressure Monitoring (BLOOD PRESSURE CUFF) MISC 1 Units by Does not apply route daily.  ? carvedilol (COREG) 12.5 MG tablet TAKE 1 AND 1/2 TABLETS TWICE DAILY  ? chlorthalidone (HYGROTON) 25 MG tablet TAKE 1 TABLET EVERY DAY  ? clopidogrel (PLAVIX) 75 MG tablet Take 1 tablet (75 mg total) by mouth daily.  ? Continuous Blood Gluc Sensor (FREESTYLE LIBRE 2 SENSOR) MISC 1 Device by Does not apply route every 14 (fourteen) days.  ? furosemide (LASIX) 40 MG tablet TAKE 1 TABLET (40 MG TOTAL) BY MOUTH DAILY. NEED APPT.  ? glucose blood (ACCU-CHEK AVIVA PLUS) test strip uad tid  ? Insulin Lispro Prot & Lispro (HUMALOG MIX 50/50 KWIKPEN) (50-50) 100 UNIT/ML Kwikpen Inject 24 Units into the skin daily with breakfast.  ? Insulin Pen Needle (UNIFINE PENTIPS) 31G X 6 MM MISC Use once daily  ? losartan (COZAAR) 100 MG tablet TAKE 1 TABLET EVERY DAY  ? Microlet Lancets MISC UAD to check sugars TID.  E11.22  ? senna-docusate (SENOKOT-S) 8.6-50 MG per tablet Take 2 tablets by mouth 2 (two) times daily. For  constipation.  ? calcium carbonate (TUMS - DOSED IN MG ELEMENTAL CALCIUM) 500 MG chewable tablet Chew 1 tablet by mouth daily as needed for indigestion or heartburn.  ? Dulaglutide (TRULICITY) 2.70 JJ/0.0XF SOPN Inject 0.5 mLs (0.75 mg total) into the skin once a week. (Patient not taking: Reported on 03/05/2022)  ? nitroGLYCERIN (NITROSTAT) 0.4 MG SL tablet Place 1 tablet (0.4 mg total) under the tongue every 5 (five) minutes as needed for chest pain (up to 3 doses).  ? ?No facility-administered encounter medications on file as of 03/05/2022.  ? ? ?Allergies (verified) ?Penicillins, Pneumococcal vaccines,  Shellfish allergy, Influenza vaccines, Lisinopril, Sildenafil, Topiramate, Zofran [ondansetron], Amlodipine, Levitra [vardenafil], Metformin, and Tadalafil  ? ?History: ?Past Medical History:  ?Diagnosis Date  ? ALLERGIC RHINITIS   ? CAD, NATIVE VESSEL   ? BMS to OM1 2001, DES to BMS 2005  ? Chronic back pain   ? herniated disc  ? 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 Constipation   ? takes Carafate four times day  ? DIAB W/UNSPEC COMP TYPE II/UNSPEC TYPE UNCNTRL   ? ERECTILE DYSFUNCTION   ? GERD   ? HYPERLIPIDEMIA-MIXED   ? HYPERTENSION, BENIGN   ? LBBB (left bundle branch block) 02/02/2016  ? MORTON'S NEUROMA, RIGHT   ? OSA on CPAP 09/09/2020  ? Peripheral neuropathy   ? Seasonal allergies   ? takes Allegra and Benadryl daily prn;uses Flonase daily  ? SHOULDER PAIN, RIGHT   ? Snoring 09/09/2020  ? 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 L HP + hemiparesis, s/p CIR   ? TIA on medication 02/2012  ? Unstable angina ([place]) 07/09/2020  ? ?Past Surgical History:  ?Procedure Laterality Date  ? ANGIOPLASTY    ? COLONOSCOPY    ? CORONARY ANGIOPLASTY  2005  ? 2 stents  ? CORONARY STENT INTERVENTION N/A 07/10/2020  ? Procedure: CORONARY STENT INTERVENTION;  Surgeon: Martinique, Peter M, MD;  Location: [facility];  Service: Cardiovascular;  Laterality: N/A;  ? DENTAL SURGERY    ? LARYNGOPLASTY  08/07/2012  ? Procedure: LARYNGOPLASTY;  Surgeon: Izora Gala, MD;  Location: [facility];  Service: ENT;  Laterality: Left;  Left Vocal Cord Medialyzation  ? LEFT HEART CATH AND CORONARY ANGIOGRAPHY N/A 07/10/2020  ? Procedure: LEFT HEART CATH AND CORONARY ANGIOGRAPHY;  Surgeon: Martinique, Peter M, MD;  Location: [facility];  Service: Cardiovascular;  Laterality: N/A;  ? left knee surgury    ? x 2   ? stent  2001, 2004  ? coronary stents  ? ?Family History  ?Problem Relation Age of Onset  ? Cancer Mother   ? ?Social History  ? ?Socioeconomic History  ? Marital status: Legally Separated  ?  Spouse name: Not on  file  ? Number of children: 3  ? Years of education: college  ? Highest education level: Not on file  ?Occupational History  ? Occupation: disabled  ?  Employer: [employer]  ?Tobacco Use  ? Smoking status: Former  ?  Packs/day: 2.50  ?  Years: 9.00  ?  Pack years: 22.50  ?  Types: Cigarettes  ?  Quit date: 02/24/1983  ?  Years since quitting: 39.0  ? Smokeless tobacco: Never  ?Vaping Use  ? Vaping Use: Never used  ?Substance and Sexual Activity  ? Alcohol use: No  ?  Alcohol/week: 0.0 standard drinks  ? Drug use: No  ? Sexual activity: Never  ?Other Topics Concern  ? Not on  file  ?Social History Narrative  ?   ? ?Social Determinants of Health  ? ?Financial Resource Strain: Low Risk   ? Difficulty of Paying Living Expenses: Not hard at all  ?Food Insecurity: No Food Insecurity  ? Worried About Charity fundraiser in the Last Year: Never true  ? Ran Out of Food in the Last Year: Never true  ?Transportation Needs: No Transportation Needs  ? Lack of Transportation (Medical): No  ? Lack of Transportation (Non-Medical): No  ?Physical Activity: Sufficiently Active  ? Days of Exercise per Week: 3 days  ? Minutes of Exercise per Session: 60 min  ?Stress: No Stress Concern Present  ? Feeling of Stress : Not at all  ?Social Connections: Socially Isolated  ? Frequency of Communication with Friends and Family: Three times a week  ? Frequency of Social Gatherings with Friends and Family: Three times a week  ? Attends Religious Services: Never  ? Active Member of Clubs or Organizations: No  ? Attends Archivist Meetings: Never  ? Marital Status: Separated  ? ? ?Tobacco Counseling ?Counseling given: Not Answered ? ? ?Clinical Intake: ? ?Pre-visit preparation completed: Yes ? ?Pain : No/denies pain ? ?  ? ?Nutritional Risks: None ?Diabetes: No ? ?How often do you need to have someone help you when you read instructions, pamphlets, or other written materials from your doctor or pharmacy?: 1 - Never ?What is the last grade  level you completed in school?: college ? ?Diabetic?yes ?Nutrition Risk Assessment: ? ?Has the patient had any N/V/D within the last 2 months?  No  ?Does the patient have any non-healing wounds?  No  ?Has the p

## 2022-03-08 ENCOUNTER — Encounter: Payer: Self-pay | Admitting: Dietician

## 2022-03-08 ENCOUNTER — Encounter: Payer: Medicare HMO | Attending: Endocrinology | Admitting: Dietician

## 2022-03-08 DIAGNOSIS — Z794 Long term (current) use of insulin: Secondary | ICD-10-CM | POA: Insufficient documentation

## 2022-03-08 DIAGNOSIS — E119 Type 2 diabetes mellitus without complications: Secondary | ICD-10-CM | POA: Insufficient documentation

## 2022-03-08 NOTE — Progress Notes (Signed)
? ?Diabetes Self-Management Education ? ?Visit Type: First/Initial ? ?Appt. Start Time: 1120 Appt. End Time: 1230 ? ?03/08/2022 ? ?[name], identified by name and date of birth, is a 71 y.o. male with a diagnosis of Diabetes: Type 2.  ? ?ASSESSMENT ?Patient is here today alone. ?He walks with a cane.  He would like to be trained on the [employer] 2 and have other education about diabetes. ? 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 ?Estimated protein needs:  75 grams ? ?Weight hx: ?209 lbs 01/04/2022 ?214 lbs 12/08/2021 ?Lost weight since he started to exercise.  [employer] 3 times weekly for cardio exercise, weights, stretches for 1.5 hours. ? ?Patient lives with his daughter and 2 grandchildren.  She does the shopping and cooking. ?He is retired from [employer].  He moved here in 2003. ?Avoids red meat.  Allergic to shellfish.  Avoids added salt.  Daughter cooks with very little salt. ? ?Height '5\' 11"'$  (1.803 m), weight 210 lb (95.3 kg). ?Body mass index is 29.29 kg/m?. ? ? Diabetes Self-Management Education - 03/08/22 1134   ? ?  ? Visit Information  ? Visit Type First/Initial   ?  ? Initial Visit  ? Diabetes Type Type 2   ? Are you currently following a meal plan? No   ? Are you taking your medications as prescribed? Yes   ? Date Diagnosed 1997   ?  ? Health Coping  ? How would you rate your overall health? Fair   ?  ? Psychosocial Assessment  ? Patient Belief/Attitude about Diabetes Afraid   ? Self-care barriers Debilitated state due to current medical condition   ? Self-management  support Doctor's office;Family   ? Other persons present Patient   ? Patient Concerns Monitoring;Nutrition/Meal planning   ? Special Needs None   ? Preferred Learning Style No preference indicated   ? Learning Readiness Ready   ? How often do you need to have someone help you when you read instructions, pamphlets, or other written materials from your doctor or pharmacy? 1 - Never   ? What is the last grade level you completed in school? 1.5 years college   ?  ? Pre-Education Assessment  ? Patient understands the diabetes disease and treatment process. Needs Instruction   ? Patient understands incorporating nutritional management into lifestyle. Needs Instruction   ? Patient undertands incorporating physical activity into lifestyle. Needs Instruction   ? Patient understands using medications safely. Needs Instruction   ? Patient understands monitoring blood glucose, interpreting and using results Needs Instruction   ? Patient understands prevention, detection, and treatment of acute complications. Needs Instruction   ? Patient understands prevention, detection, and treatment of chronic complications. Needs Instruction   ? Patient understands how to develop strategies to address psychosocial issues. Needs Instruction   ? Patient understands how to develop strategies to promote health/change behavior. Needs Instruction   ?  ? Complications  ? Last HgB A1C per patient/outside source 6.9 %   01/04/2022  ? How often do you check your blood sugar? 0  times/day (not testing)   meter broke. [place] training today  ? Have you had a dilated eye exam in the past 12 months? Yes   ? Have you had a dental exam in the past 12 months? No   ? Are you checking your feet? Yes   ? How many days per week are you checking your feet? 7   ?  ? Dietary Intake  ? Breakfast egg, [place] sausage, english muffin this am OR usually boiled egg/egg salad, grits (cooked) OR SKIPS   ? Snack (morning) none   ? Lunch chik fil-A grilled chicken wrap, tator  tots or fries OR egg salad and grits OR smoothie (fresh berries, banana, water or coffee creamer, occasional vegetables, ginger   ? Snack (afternoon) orange or banana   ? Dinner chicken or fish (grilled), mixed vegetables OR PB and honey sandwich on [employer] bread, fresh dates   ? Snack (evening) occasional banana or orange or grapes   ? Beverage(s) water, almond milk, rare sweet or unsweetened tea   ?  ? Exercise  ? Exercise Type Light (walking / raking leaves)   ? How many days per week to you exercise? 3   ? How many minutes per day do you exercise? 90   ? Total minutes per week of exercise 270   ?  ? Patient Education  ? Previous Diabetes Education No   ? Nutrition management  Meal options for control of blood glucose level and chronic complications.;Other (comment)   renal diet basics  ? Medications Reviewed patients medication for diabetes, action, purpose, timing of dose and side effects.   ? Monitoring Other (comment);Identified appropriate SMBG and/or A1C goals.   taught patient on the FreeStyle [place] 2  ? Acute complications Taught treatment of hypoglycemia - the 15 rule.   ?  ? Individualized Goals (developed by patient)  ? Nutrition Other (comment)   renal diabetic diet  ? Physical Activity Exercise 3-5 times per week;60 minutes per day   ? Medications take my medication as prescribed   ? Monitoring  test my blood glucose as discussed   ? Reducing Risk increase portions of healthy fats   low sodium, low protein  ?  ? Post-Education Assessment  ? Patient understands the diabetes disease and treatment process. Demonstrates understanding / competency   ? Patient understands incorporating nutritional management into lifestyle. Needs Review   ? Patient undertands incorporating physical activity into lifestyle. Demonstrates understanding / competency   ? Patient understands using medications safely. Demonstrates understanding / competency   ? Patient understands monitoring blood glucose, interpreting and using  results Needs Review   ? Patient understands prevention, detection, and treatment of acute complications. Demonstrates understanding / competency   ? Patient understands prevention, detection, and treatment of chronic complications. Demonstrates understanding / competency   ? Patient understands how to develop strategies to address psychosocial issues. Demonstrates understanding / competency   ? Patient understands how to develop strategies to promote health/change behavior. Needs Review   ?  ? Outcomes  ? Expected Outcomes Demonstrated interest in learning. Expect positive outcomes   ? Future DMSE 2 months   ? Program Status Not Completed   ? ?  ?  ? ?  ? ? ?Individualized Plan for Diabetes Self-Management Training:  ? ?Learning Objective:  Patient will have a greater understanding of diabetes self-management. ?Patient education plan is to attend individual and/or group sessions per assessed needs and concerns. ?  ?Plan:  ? ?Patient Instructions  ?  Calorie [employer] app ?Read your labels for sodium ?Avoid added salt ?Avoid foods with PHOS... in the ingredients ? ?Expected Outcomes:  Demonstrated interest in learning. Expect positive outcomes ? ?Education material provided: [place] for those not on dialysis ? ?If problems or questions, patient to contact team via:  Phone ? ?Future DSME appointment: 2 months ? ? ? ? ? ?

## 2022-03-08 NOTE — Patient Instructions (Signed)
Calorie [employer]ace app ?Read your labels for sodium ?Avoid added salt ?Avoid foods with PHOS... in the ingredients ?

## 2022-03-23 ENCOUNTER — Telehealth: Payer: Self-pay

## 2022-03-23 NOTE — Progress Notes (Signed)
? ? ?Chronic Care Management ?Pharmacy Assistant  ? ?Name: [name]  MRN: [id number] DOB: 1951-07-04 ? ? ?Reason for Encounter: Disease State-General Adherence ?  ?Recent office visits:  ?None since the last coordination call ? ?Recent consult visits:  ?None since last coordination call ? ?Hospital visits:  ?None since the last coordinations call ? ?Medications: ?Outpatient Encounter Medications as of 03/23/2022  ?Medication Sig Note  ? amLODipine (NORVASC) 5 MG tablet Take 5 mg by mouth daily.   ? aspirin EC 81 MG tablet Take 1 tablet (81 mg total) by mouth daily. Swallow whole.   ? atorvastatin (LIPITOR) 40 MG tablet TAKE 1 TABLET EVERY DAY   ? Blood Pressure Monitoring (BLOOD PRESSURE CUFF) MISC 1 Units by Does not apply route daily.   ? calcium carbonate (TUMS - DOSED IN MG ELEMENTAL CALCIUM) 500 MG chewable tablet Chew 1 tablet by mouth daily as needed for indigestion or heartburn.   ? carvedilol (COREG) 12.5 MG tablet TAKE 1 AND 1/2 TABLETS TWICE DAILY   ? chlorthalidone (HYGROTON) 25 MG tablet TAKE 1 TABLET EVERY DAY   ? clopidogrel (PLAVIX) 75 MG tablet Take 1 tablet (75 mg total) by mouth daily.   ? Continuous Blood Gluc Sensor (FREESTYLE LIBRE 2 SENSOR) MISC 1 Device by Does not apply route every 14 (fourteen) days.   ? Dulaglutide (TRULICITY) 7.25 DG/6.4QI SOPN Inject 0.5 mLs (0.75 mg total) into the skin once a week. (Patient not taking: Reported on 03/05/2022) 12/08/2021: Every sunday  ? furosemide (LASIX) 40 MG tablet TAKE 1 TABLET (40 MG TOTAL) BY MOUTH DAILY. NEED APPT. 12/08/2021: prn  ? glucose blood (ACCU-CHEK AVIVA PLUS) test strip uad tid   ? Insulin Lispro Prot & Lispro (HUMALOG MIX 50/50 KWIKPEN) (50-50) 100 UNIT/ML Kwikpen Inject 24 Units into the skin daily with breakfast.   ? Insulin Pen Needle (UNIFINE PENTIPS) 31G X 6 MM MISC Use once daily   ? losartan (COZAAR) 100 MG tablet TAKE 1 TABLET EVERY DAY   ? Microlet Lancets MISC UAD to check sugars TID.  E11.22   ? nitroGLYCERIN  (NITROSTAT) 0.4 MG SL tablet Place 1 tablet (0.4 mg total) under the tongue every 5 (five) minutes as needed for chest pain (up to 3 doses). 12/08/2021: Has on hand  ? senna-docusate (SENOKOT-S) 8.6-50 MG per tablet Take 2 tablets by mouth 2 (two) times daily. For constipation.   ? ?No facility-administered encounter medications on file as of 03/23/2022.  ? ?3 attempts to contacted [name] for General Review call. Unable to reach patient, mailbox full. ? ? ?Chart Review: ? ?Have there been any documented new, changed, or discontinued medications since last visit? No (If yes, include name, dose, frequency, date) ?Has there been any documented recent hospitalizations or ED visits since last visit with Clinical Pharmacist? No ?Brief Summary (including medication and/or Diagnosis changes): ? ? ?Adherence Review: ? ?Does the Clinical Pharmacist Assistant have access to adherence rates? Yes ?Adherence rates for STAR metric medications (List medication(s)/day supply/ last 2 fill dates). ?Adherence rates for medications indicated for disease state being reviewed (List medication(s)/day supply/ last 2 fill dates). ?Does the patient have >5 day gap between last estimated fill dates for any of the above medications or other medication gaps? No ?Reason for medication gaps. ? ? ?14. Next visit Type: telephone ?      Visit with:Clinical Pharmacist ?       Date:07/27/22 ?       Time:9am ? ?   ?Care  Gaps: ?Colonoscopy - 01/02/2021 ?Diabetic Foot Exam - 01/06/22 ?Ophthalmology - 09/23/2017 ?Dexa Scan - NA ?Annual Well Visit - 01/28/2021 ?Micro albumin - NA ?Hemoglobin A1c - 01/04/22 ?  ?Star Rating Drugs: ?Atorvastatin - filled 01/23/22 90 DS ?Losartan - last fill 03/11/22 90 DS ?  ?[name] ?Clinical Pharmacist Assistant ?[phone]  ? ?

## 2022-03-29 ENCOUNTER — Ambulatory Visit (INDEPENDENT_AMBULATORY_CARE_PROVIDER_SITE_OTHER): Payer: Medicare HMO

## 2022-03-29 ENCOUNTER — Ambulatory Visit
Admission: EM | Admit: 2022-03-29 | Discharge: 2022-03-29 | Disposition: A | Discharge status: Home / Self Care | Payer: Medicare HMO | Attending: Physician Assistant | Admitting: Physician Assistant

## 2022-03-29 DIAGNOSIS — R42 Dizziness and giddiness: Secondary | ICD-10-CM

## 2022-03-29 DIAGNOSIS — R059 Cough, unspecified: Secondary | ICD-10-CM | POA: Diagnosis not present

## 2022-03-29 DIAGNOSIS — J209 Acute bronchitis, unspecified: Secondary | ICD-10-CM

## 2022-03-29 DIAGNOSIS — I5042 Chronic combined systolic (congestive) and diastolic (congestive) heart failure: Secondary | ICD-10-CM

## 2022-03-29 DIAGNOSIS — R0989 Other specified symptoms and signs involving the circulatory and respiratory systems: Secondary | ICD-10-CM | POA: Diagnosis not present

## 2022-03-29 MED ORDER — DOXYCYCLINE HYCLATE 100 MG PO CAPS: 100.0000 mg | ORAL_CAPSULE | Freq: Two times a day (BID) | ORAL | 0 refills | Status: DC

## 2022-03-29 NOTE — ED Triage Notes (Signed)
Patient presents to Urgent Care with complaints of ha, sinus pressure, sore throat, diarrhea, dizziness since almost 2 weeks ago. Patient reports otc cold med with minimal improvement.  ? ?

## 2022-03-29 NOTE — ED Provider Notes (Signed)
?[facility] ? ? ? ?CSN: [id number] ?Arrival date & time: 03/29/22  0843 ? ? ?  ? ?History   ?Chief Complaint ?Chief Complaint  ?Patient presents with  ? Chills  ? ? ?HPI ?[name] is a 71 y.o. male.  ? ?The history is provided by the patient. No language interpreter was used.  ?Cough ?Cough characteristics:  Productive ?Sputum characteristics:  Nondescript ?Severity:  Moderate ?Onset quality:  Gradual ?Duration:  2 weeks ?Timing:  Constant ?Progression:  Worsening ?Chronicity:  New ?Smoker: no   ?Context: not sick contacts   ?Relieved by:  Nothing ?Worsened by:  Nothing ?Ineffective treatments:  None tried ?Associated symptoms: no fever   ? ?Past Medical History:  ?Diagnosis Date  ? ALLERGIC RHINITIS   ? CAD, NATIVE VESSEL   ? BMS to OM1 2001, DES to BMS 2005  ? Chronic back pain   ? herniated disc  ? 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 Chronic kidney disease   ? Constipation   ? takes Carafate four times day  ? DIAB W/UNSPEC COMP TYPE II/UNSPEC TYPE UNCNTRL   ? ERECTILE DYSFUNCTION   ? GERD   ? HYPERLIPIDEMIA-MIXED   ? HYPERTENSION, BENIGN   ? LBBB (left bundle branch block) 02/02/2016  ? MORTON'S NEUROMA, RIGHT   ? OSA on CPAP 09/09/2020  ? Peripheral neuropathy   ? Seasonal allergies   ? takes Allegra and Benadryl daily prn;uses Flonase daily  ? SHOULDER PAIN, RIGHT   ? Snoring 09/09/2020  ? Stroke, thrombotic ([place]) 07/2012  ? L HP + hemiparesis, s/p CIR   ? TIA on medication 02/2012  ? Unstable angina ([place]) 07/09/2020  ? ? ?Patient Active Problem List  ? Diagnosis Date Noted  ? Ganglion cyst of wrist, left 12/14/2021  ? Viral gastroenteritis 12/14/2021  ? Urinary frequency 11/06/2021  ? Coronary artery disease 03/25/2021  ? Hoarse 03/25/2021  ? Major depressive disorder, recurrent episode, moderate ([place]) 03/25/2021  ? Major depressive disorder, single episode 03/25/2021  ? 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 Observation and evaluation for other specified  suspected conditions 03/25/2021  ? Vocal cord paralysis 03/25/2021  ? Diabetic peripheral neuropathy ([place]) 12/25/2020  ? Orthopnea 12/16/2020  ? OSA on CPAP 09/09/2020  ? Unstable angina ([place]) 07/09/2020  ? Sciatica of left side 07/11/2019  ? Cough 07/11/2019  ? Edema leg 10/26/2018  ? History of stroke 10/26/2018  ? DOE (dyspnea on exertion) 10/26/2018  ? Left hip pain 07/14/2018  ? Acute left ankle pain 07/14/2018  ? Chest pain 07/14/2018  ? Acute non-recurrent ethmoidal sinusitis 03/13/2018  ? Nonintractable headache 02/24/2018  ? CRI (chronic renal insufficiency), stage 3 (moderate) 02/22/2017  ? Anemia 02/16/2017  ? 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 LBBB (left bundle branch block) 02/02/2016  ? Nonallopathic lesion of lumbosacral region 12/11/2014  ? Nonallopathic lesion of sacral region 12/11/2014  ? Nonallopathic lesion of thoracic region 12/11/2014  ? Arthritis of left hip 11/20/2014  ? Ischial bursitis of left side 10/28/2014  ? Hamstring tightness of left lower extremity 09/16/2014  ? Piriformis syndrome of left side 09/16/2014  ? Dysphonia 06/22/2013  ? Hemiparesis affecting left side as late effect of stroke ([place]) 10/10/2012  ? Dysphagia following cerebrovascular accident 08/14/2012  ? Chronic back pain   ? Peripheral neuropathy ([place])   ? TIA (transient ischemic attack) 04/08/2012  ? Stroke ([place]) 02/21/2012  ? [place], RIGHT 05/29/2010  ? Diabetes ([place]) 04/13/2010  ?  ALLERGIC RHINITIS 04/13/2010  ? GERD 04/13/2010  ? ERECTILE DYSFUNCTION 03/12/2009  ? Essential hypertension 03/12/2009  ? CAD S/P percutaneous coronary angioplasty 03/12/2009  ? Hyperlipidemia 03/07/2009  ? ? ?Past Surgical History:  ?Procedure Laterality Date  ? ANGIOPLASTY    ? COLONOSCOPY    ? CORONARY ANGIOPLASTY  2005  ? 2 stents  ? CORONARY STENT INTERVENTION N/A 07/10/2020  ? Procedure: CORONARY STENT INTERVENTION;  Surgeon: Martinique, Peter M, MD;  Location: [facility];  Service:  Cardiovascular;  Laterality: N/A;  ? DENTAL SURGERY    ? LARYNGOPLASTY  08/07/2012  ? Procedure: LARYNGOPLASTY;  Surgeon: Izora Gala, MD;  Location: [facility];  Service: ENT;  Laterality: Left;  Left Vocal Cord Medialyzation  ? LEFT HEART CATH AND CORONARY ANGIOGRAPHY N/A 07/10/2020  ? Procedure: LEFT HEART CATH AND CORONARY ANGIOGRAPHY;  Surgeon: Martinique, Peter M, MD;  Location: [facility];  Service: Cardiovascular;  Laterality: N/A;  ? left knee surgury    ? x 2   ? stent  2001, 2004  ? coronary stents  ? ? ? ? ? ?Home Medications   ? ?Prior to Admission medications   ?Medication Sig Start Date End Date Taking? Authorizing Provider  ?amLODipine (NORVASC) 5 MG tablet Take 5 mg by mouth daily.    [provider]  ?aspirin EC 81 MG tablet Take 1 tablet (81 mg total) by mouth daily. Swallow whole. 07/10/20   Dunn, Nedra Hai, PA-C  ?atorvastatin (LIPITOR) 40 MG tablet TAKE 1 TABLET EVERY DAY 11/11/21   Skeet Latch, MD  ?Blood Pressure Monitoring (BLOOD PRESSURE CUFF) MISC 1 Units by Does not apply route daily. 02/03/21   Skeet Latch, MD  ?calcium carbonate (TUMS - DOSED IN MG ELEMENTAL CALCIUM) 500 MG chewable tablet Chew 1 tablet by mouth daily as needed for indigestion or heartburn.    [provider]  ?carvedilol (COREG) 12.5 MG tablet TAKE 1 AND 1/2 TABLETS TWICE DAILY 09/17/21   Skeet Latch, MD  ?chlorthalidone (HYGROTON) 25 MG tablet TAKE 1 TABLET EVERY DAY ?Patient not taking: Reported on 03/29/2022 02/03/22   Skeet Latch, MD  ?clopidogrel (PLAVIX) 75 MG tablet Take 1 tablet (75 mg total) by mouth daily. 02/03/22   Skeet Latch, MD  ?Continuous Blood Gluc Sensor (FREESTYLE LIBRE 2 SENSOR) MISC 1 Device by Does not apply route every 14 (fourteen) days. 11/03/21   Renato Shin, MD  ?Dulaglutide (TRULICITY) 4.17 EY/8.1KG SOPN Inject 0.5 mLs (0.75 mg total) into the skin once a week. ?Patient not taking: Reported on 03/05/2022 07/02/20   Renato Shin, MD  ?furosemide (LASIX)  40 MG tablet TAKE 1 TABLET (40 MG TOTAL) BY MOUTH DAILY. NEED APPT. 09/16/21   Skeet Latch, MD  ?glucose blood (ACCU-CHEK AVIVA PLUS) test strip uad tid 12/14/21   Binnie Rail, MD  ?Insulin Lispro Prot & Lispro (HUMALOG MIX 50/50 KWIKPEN) (50-50) 100 UNIT/ML Kwikpen Inject 24 Units into the skin daily with breakfast. 01/14/22   Renato Shin, MD  ?Insulin Pen Needle (UNIFINE PENTIPS) 31G X 6 MM MISC Use once daily 12/14/21   Binnie Rail, MD  ?losartan (COZAAR) 100 MG tablet TAKE 1 TABLET EVERY DAY 10/07/21   Skeet Latch, MD  ?Microlet Lancets MISC UAD to check sugars TID.  E11.22 12/14/21   Binnie Rail, MD  ?nitroGLYCERIN (NITROSTAT) 0.4 MG SL tablet Place 1 tablet (0.4 mg total) under the tongue every 5 (five) minutes as needed for chest pain (up to 3 doses). 07/10/20  07/10/21  Dunn, Nedra Hai, PA-C  ?senna-docusate (SENOKOT-S) 8.6-50 MG per tablet Take 2 tablets by mouth 2 (two) times daily. For constipation. 09/07/12   Bary Leriche, PA-C  ? ? ?Family History ?Family History  ?Problem Relation Age of Onset  ? Cancer Mother   ? ? ?Social History ?Social History  ? ?Tobacco Use  ? Smoking status: Former  ?  Packs/day: 2.50  ?  Years: 9.00  ?  Pack years: 22.50  ?  Types: Cigarettes  ?  Quit date: 02/24/1983  ?  Years since quitting: 39.1  ? Smokeless tobacco: Never  ?Vaping Use  ? Vaping Use: Never used  ?Substance Use Topics  ? Alcohol use: No  ?  Alcohol/week: 0.0 standard drinks  ? Drug use: No  ? ? ? ?Allergies   ?Penicillins, Pneumococcal vaccines, Shellfish allergy, Influenza vaccines, Lisinopril, Sildenafil, Topiramate, Zofran [ondansetron], Amlodipine, Levitra [vardenafil], Metformin, and Tadalafil ? ? ?Review of Systems ?Review of Systems  ?Constitutional:  Negative for fever.  ?Respiratory:  Positive for cough.   ?All other systems reviewed and are negative. ? ? ?Physical Exam ?Triage Vital Signs ?ED Triage Vitals  ?Enc Vitals Group  ?   BP 03/29/22 0934 128/80  ?   Pulse Rate 03/29/22 0934 80   ?   Resp 03/29/22 0934 18  ?   Temp 03/29/22 0934 97.8 ?F (36.6 ?C)  ?   Temp src --   ?   SpO2 03/29/22 0934 99 %  ?   Weight --   ?   Height --   ?   Head Circumference --   ?   Peak Flow --   ?

## 2022-03-29 NOTE — Discharge Instructions (Addendum)
Covid test is pending.  See your Physicain for recheck if symptoms worsen or change  ?

## 2022-03-30 LAB — NOVEL CORONAVIRUS, NAA: SARS-CoV-2, NAA: NOT DETECTED

## 2022-03-31 ENCOUNTER — Other Ambulatory Visit: Payer: Self-pay

## 2022-03-31 DIAGNOSIS — E119 Type 2 diabetes mellitus without complications: Secondary | ICD-10-CM

## 2022-03-31 MED ORDER — HUMALOG MIX 50/50 KWIKPEN (50-50) 100 UNIT/ML ~~LOC~~ SUPN: 24.0000 [IU] | PEN_INJECTOR | Freq: Every day | SUBCUTANEOUS | 1 refills | Status: DC

## 2022-03-31 MED ORDER — UNIFINE PENTIPS 31G X 6 MM MISC: 0 refills | Status: DC

## 2022-04-06 ENCOUNTER — Ambulatory Visit: Payer: Medicare HMO | Admitting: Endocrinology

## 2022-04-07 ENCOUNTER — Ambulatory Visit (INDEPENDENT_AMBULATORY_CARE_PROVIDER_SITE_OTHER): Payer: Medicare HMO | Admitting: Podiatry

## 2022-04-07 ENCOUNTER — Encounter: Payer: Self-pay | Admitting: Podiatry

## 2022-04-07 DIAGNOSIS — B351 Tinea unguium: Secondary | ICD-10-CM

## 2022-04-07 DIAGNOSIS — M79675 Pain in left toe(s): Secondary | ICD-10-CM | POA: Diagnosis not present

## 2022-04-07 DIAGNOSIS — M79674 Pain in right toe(s): Secondary | ICD-10-CM

## 2022-04-07 DIAGNOSIS — E1142 Type 2 diabetes mellitus with diabetic polyneuropathy: Secondary | ICD-10-CM

## 2022-04-07 DIAGNOSIS — M792 Neuralgia and neuritis, unspecified: Secondary | ICD-10-CM

## 2022-04-07 NOTE — Progress Notes (Signed)
This patient returns to my office for at risk foot care.  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  Patient is taking plavix.  Patient has history of CVA.  This patient is unable to cut nails himself since the patient cannot reach his nails.These nails are painful walking and wearing shoes.  This patient presents for at risk foot care today. ? ?General Appearance  Alert, conversant and in no acute stress. ? ?Vascular  Dorsalis pedis and posterior tibial  pulses are weakly  palpable  bilaterally.  Capillary return is within normal limits  Bilaterally.Cold feet left.  .  Absent digital hair  B/L. ? ?Neurologic  Senn-Weinstein monofilament wire test diminished  bilaterally. Muscle power within normal limits bilaterally. ? ?Nails Thick disfigured discolored nails with subungual debris  from hallux to fifth toes bilaterally. No evidence of bacterial infection or drainage bilaterally. ? ?Orthopedic  No limitations of motion  feet .  No crepitus or effusions noted.  No bony pathology or digital deformities noted. ? ?Skin  normotropic skin with no porokeratosis noted bilaterally.  No signs of infections or ulcers noted.  Midfoot DJD right.  Hammer toes  B/L.  ? ?Onychomycosis  Pain in right toes  Pain in left toes ? ?Consent was obtained for treatment procedures.   Mechanical debridement of nails 1-5  bilaterally performed with a nail nipper.  Filed with dremel without incident. Patient was told to use pumice stone heels  B/L and continue using vaseline. ? ? ?Return office visit   3 months                   Told patient to return for periodic foot care and evaluation due to potential at risk complications. ? ? ?Gardiner Barefoot DPM  ?

## 2022-04-13 ENCOUNTER — Other Ambulatory Visit: Payer: Self-pay

## 2022-04-13 DIAGNOSIS — Z794 Long term (current) use of insulin: Secondary | ICD-10-CM

## 2022-04-13 MED ORDER — HUMALOG MIX 50/50 KWIKPEN (50-50) 100 UNIT/ML ~~LOC~~ SUPN: 24.0000 [IU] | PEN_INJECTOR | Freq: Every day | SUBCUTANEOUS | 1 refills | Status: DC

## 2022-04-16 ENCOUNTER — Telehealth: Payer: Self-pay

## 2022-04-16 ENCOUNTER — Other Ambulatory Visit (HOSPITAL_COMMUNITY): Payer: Self-pay

## 2022-04-16 ENCOUNTER — Ambulatory Visit (INDEPENDENT_AMBULATORY_CARE_PROVIDER_SITE_OTHER): Payer: Medicare HMO | Admitting: Internal Medicine

## 2022-04-16 ENCOUNTER — Encounter: Payer: Self-pay | Admitting: Internal Medicine

## 2022-04-16 VITALS — BP 134/86 | HR 80 | Ht 71.0 in | Wt 208.2 lb

## 2022-04-16 DIAGNOSIS — E1159 Type 2 diabetes mellitus with other circulatory complications: Secondary | ICD-10-CM

## 2022-04-16 DIAGNOSIS — Z794 Long term (current) use of insulin: Secondary | ICD-10-CM

## 2022-04-16 DIAGNOSIS — E119 Type 2 diabetes mellitus without complications: Secondary | ICD-10-CM

## 2022-04-16 DIAGNOSIS — E1165 Type 2 diabetes mellitus with hyperglycemia: Secondary | ICD-10-CM

## 2022-04-16 LAB — POCT GLYCOSYLATED HEMOGLOBIN (HGB A1C): Hemoglobin A1C: 7.4 % — AB (ref 4.0–5.6)

## 2022-04-16 MED ORDER — INSULIN PEN NEEDLE 32G X 4 MM MISC: 3 refills | Status: DC

## 2022-04-16 MED ORDER — HUMALOG MIX 50/50 KWIKPEN (50-50) 100 UNIT/ML ~~LOC~~ SUPN: 24.0000 [IU] | PEN_INJECTOR | Freq: Every day | SUBCUTANEOUS | 1 refills | Status: DC

## 2022-04-16 NOTE — Telephone Encounter (Signed)
Patient Advocate Encounter   Received notification that prior authorization for HumaLOG Mix 50/50 KwikPen (50-50) 100UNIT/ML pen-injectors is required.   URGENT PA submitted on 04/15/2022 completed on 04/16/2022 via telephone ([phone])  [place] [id number] Status is pending

## 2022-04-16 NOTE — Progress Notes (Signed)
Patient ID: [name], male   DOB: Jun 26, 1951, 71 y.o.   MRN: [id number]  HPI: [name] is a 71 y.o.-year-old male, returning for follow-up for DM2, dx in 2007, insulin-dependent since 2011, uncontrolled, with complications (CAD, stroke, CKD, DR, ED, PN). Pt. previously saw Dr. Loanne Walker, last visit 3 months ago.  Reviewed HbA1c: Lab Results  Component Value Date   HGBA1C 6.9 (A) 01/04/2022   HGBA1C 6.8 (A) 11/03/2021   HGBA1C 6.6 (A) 08/18/2021   HGBA1C 6.7 (A) 05/11/2021   HGBA1C 6.6 (A) 02/05/2021   HGBA1C 7.4 (A) 10/07/2020   HGBA1C 7.1 (A) 08/08/2020   HGBA1C 7.5 (A) 06/13/2020   HGBA1C 9.0 (A) 04/10/2020   HGBA1C 9.0 (A) 10/09/2019   Pt is on a regimen of: - Humalog 50/50 (prev. From PAP, now covered by the new insurance w/o a PA) 22 units after brunch - Trulicity 7.90 mg weekly -stopped  in 12/2021 because feeling "weird, sweaty" He has significant tremors and cannot draw insulin from vials.  Pt checks his sugars >4x a day and they are:   Lowest sugar was 54; he has hypoglycemia awareness at 70.  Highest sugar was 264.  Glucometer: Accu-Chek  He usually has 2 meals: Brunch and dinner.  - + CKD, last BUN/creatinine:  Lab Results  Component Value Date   BUN 34 (H) 12/08/2021   BUN 41 (H) 09/10/2020   CREATININE 2.09 (H) 12/08/2021   CREATININE 2.29 (H) 09/10/2020  On Cozaar 100 mg daily.  - + HL; last set of lipids: CHOLESTEROL  106  50 - 200 04/24/2021  TRIGLYCERIDE  107  30 - 200 04/24/2021  CHOLESTEROL IN HDL  60  29.0 - 71.0 04/24/2021  CHOLESTEROL IN LDL  25  60 - 130 04/24/2021  Previously: Lab Results  Component Value Date   CHOL 112 04/15/2020   HDL 44.60 04/15/2020   LDLCALC 40 04/15/2020   LDLDIRECT 63.0 01/12/2016   TRIG 136.0 04/15/2020   CHOLHDL 3 04/15/2020  On Lipitor 40 mg daily.  - last eye exam was in 11/2020: + DR reportedly. + Cataracts. He has a new appt coming up in 05/2022.  On ASA 81.  - no numbness and  tingling in his feet.  Last foot exam 04/07/2022 by Dr. Prudence Walker.  ROS: + see HPI No increased urination, blurry vision, nausea, chest pain.  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/29/2016   Chronic kidney disease    Constipation    takes Carafate four times day   DIAB W/UNSPEC COMP TYPE II/UNSPEC TYPE UNCNTRL    ERECTILE DYSFUNCTION    GERD    HYPERLIPIDEMIA-MIXED    HYPERTENSION, BENIGN    LBBB (left bundle branch block) 02/02/2016   MORTON'S NEUROMA, RIGHT    OSA on CPAP 09/09/2020   Peripheral neuropathy    Seasonal allergies    takes Allegra and Benadryl daily prn;uses Flonase daily   SHOULDER PAIN, RIGHT    Snoring 09/09/2020   Stroke, 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   L HP + hemiparesis, s/p CIR    TIA on medication 02/2012   Unstable angina ([place]) 07/09/2020   Past Surgical History:  Procedure Laterality Date   ANGIOPLASTY     COLONOSCOPY     CORONARY ANGIOPLASTY  2005  2 stents   CORONARY STENT INTERVENTION N/A 07/10/2020   Procedure: CORONARY STENT INTERVENTION;  Surgeon: Martinique, Peter M, MD;  Location: [facility];  Service: Cardiovascular;  Laterality: N/A;   DENTAL SURGERY     LARYNGOPLASTY  08/07/2012   Procedure: LARYNGOPLASTY;  Surgeon: Izora Gala, MD;  Location: [facility];  Service: ENT;  Laterality: Left;  Left Vocal Cord Medialyzation   LEFT HEART CATH AND CORONARY ANGIOGRAPHY N/A 07/10/2020   Procedure: LEFT HEART CATH AND CORONARY ANGIOGRAPHY;  Surgeon: Martinique, Peter M, MD;  Location: [facility];  Service: Cardiovascular;  Laterality: N/A;   left knee surgury     x 2    stent  2001, 2004   coronary stents   Social History   Socioeconomic History   Marital status: Legally Separated    Spouse name: Not on file   Number of children: 3   Years of education: college    Highest education level: Not on file  Occupational History   Occupation: disabled    Employer: [employer]  Tobacco Use   Smoking status: Former    Packs/day: 2.50    Years: 9.00    Pack years: 22.50    Types: Cigarettes    Quit date: 02/24/1983    Years since quitting: 39.1   Smokeless tobacco: Never  Vaping Use   Vaping Use: Never used  Substance and Sexual Activity   Alcohol use: No    Alcohol/week: 0.0 standard drinks   Drug use: No   Sexual activity: Never  Other Topics Concern   Not on file  Social History Narrative      Social Determinants of Health   Financial Resource Strain: Low Risk    Difficulty of Paying Living Expenses: Not hard at all  Food Insecurity: No Food Insecurity   Worried About Charity fundraiser in the Last Year: Never true   [place] in the Last Year: Never true  Transportation Needs: No Transportation Needs   Lack of Transportation (Medical): No   Lack of Transportation (Non-Medical): No  Physical Activity: Sufficiently Active   Days of Exercise per Week: 3 days   Minutes of Exercise per Session: 60 min  Stress: No Stress Concern Present   Feeling of Stress : Not at all  Social Connections: Socially Isolated   Frequency of Communication with Friends and Family: Three times a week   Frequency of Social Gatherings with Friends and Family: Three times a week   Attends Religious Services: Never   Active Member of Clubs or Organizations: No   Attends Archivist Meetings: Never   Marital Status: Separated  Intimate Partner Violence: Not At Risk   Fear of Current or Ex-Partner: No   Emotionally Abused: No   Physically Abused: No   Sexually Abused: No   Current Outpatient Medications on File Prior to Visit  Medication Sig Dispense Refill   amLODipine (NORVASC) 5 MG tablet Take 5 mg by mouth daily.     aspirin EC 81 MG tablet Take 1 tablet (81 mg total) by mouth daily. Swallow whole. 30 tablet 11   atorvastatin (LIPITOR) 40  MG tablet TAKE 1 TABLET EVERY DAY 90 tablet 3   Blood Pressure Monitoring (BLOOD PRESSURE CUFF) MISC 1 Units by Does not apply route daily. 1 each 0   calcium carbonate (TUMS - DOSED IN MG ELEMENTAL CALCIUM) 500 MG chewable tablet Chew 1 tablet by mouth daily as needed for indigestion or heartburn.  carvedilol (COREG) 12.5 MG tablet TAKE 1 AND 1/2 TABLETS TWICE DAILY 270 tablet 2   chlorthalidone (HYGROTON) 25 MG tablet TAKE 1 TABLET EVERY DAY (Patient not taking: Reported on 03/29/2022) 90 tablet 2   clopidogrel (PLAVIX) 75 MG tablet Take 1 tablet (75 mg total) by mouth daily. 90 tablet 2   Continuous Blood Gluc Sensor (FREESTYLE LIBRE 2 SENSOR) MISC 1 Device by Does not apply route every 14 (fourteen) days. 6 each 3   doxycycline (VIBRAMYCIN) 100 MG capsule Take 1 capsule (100 mg total) by mouth 2 (two) times daily. 20 capsule 0   Dulaglutide (TRULICITY) 1.61 WR/6.0AV SOPN Inject 0.5 mLs (0.75 mg total) into the skin once a week. (Patient not taking: Reported on 03/05/2022) 6 mL 3   furosemide (LASIX) 40 MG tablet TAKE 1 TABLET (40 MG TOTAL) BY MOUTH DAILY. NEED APPT. 90 tablet 3   glucose blood (ACCU-CHEK AVIVA PLUS) test strip uad tid 100 each 12   Insulin Lispro Prot & Lispro (HUMALOG MIX 50/50 KWIKPEN) (50-50) 100 UNIT/ML Kwikpen Inject 24 Units into the skin daily with breakfast. 45 mL 1   Insulin Pen Needle (UNIFINE PENTIPS) 31G X 6 MM MISC Use once daily 100 each 0   losartan (COZAAR) 100 MG tablet TAKE 1 TABLET EVERY DAY 90 tablet 3   Microlet Lancets MISC UAD to check sugars TID.  E11.22 270 each 3   nitroGLYCERIN (NITROSTAT) 0.4 MG SL tablet Place 1 tablet (0.4 mg total) under the tongue every 5 (five) minutes as needed for chest pain (up to 3 doses). 25 tablet 3   senna-docusate (SENOKOT-S) 8.6-50 MG per tablet Take 2 tablets by mouth 2 (two) times daily. For constipation.     No current facility-administered medications on file prior to visit.   Allergies  Allergen Reactions    Penicillins Anaphylaxis   Pneumococcal Vaccines Anaphylaxis   Shellfish Allergy Anaphylaxis   Influenza Vaccines    Lisinopril Cough   Sildenafil Other (See Comments)    Other reaction(s): Headache, Dizziness   Topiramate Other (See Comments)    Chest spasms and numbness   Zofran [Ondansetron]    Amlodipine Other (See Comments)    LE edema   Levitra [Vardenafil] Other (See Comments)    headaches   Metformin Nausea And Vomiting   Tadalafil Other (See Comments)    dizziness   Family History  Problem Relation Age of Onset   Cancer Mother     PE: BP 134/86 (BP Location: Left Arm, Patient Position: Sitting, Cuff Size: Normal)   Pulse 80   Ht '5\' 11"'$  (1.803 m)   Wt 208 lb 3.2 oz (94.4 kg)   SpO2 98%   BMI 29.04 kg/m  Wt Readings from Last 3 Encounters:  04/16/22 208 lb 3.2 oz (94.4 kg)  03/08/22 210 lb (95.3 kg)  01/04/22 209 lb 12.8 oz (95.2 kg)   Constitutional: overweight, in NAD Eyes: PERRLA, EOMI, no exophthalmos ENT: moist mucous membranes, no thyromegaly, no cervical lymphadenopathy Cardiovascular: RRR, No MRG Respiratory: CTA B Musculoskeletal: no deformities, strength intact in all 4 Skin: moist, warm, no rashes Neurological: + tremor with outstretched hands, DTR normal in all 4  ASSESSMENT: 1. DM2, insulin-dependent, uncontrolled, with complications - CAD - s/p BMS in 2001, and DES in 2005 - stroke - CKD - DR - ED - PN - Hypoglycemia-last in 2020  2. HL  PLAN:  1. Patient with long-standing, insulin-dependent diabetes, on injectable antidiabetic regimen, with fair control.  Latest HbA1c was  6.9%, higher, 3 months ago.  At today's visit, HbA1c 7.4%, higher than before. CGM interpretation: -At today's visit, we reviewed his CGM downloads: It appears that 82% of values are in target range (goal >70%), while 18% are higher than 180 (goal <25%), and 0% are lower than 70 (goal <4%).  The calculated average blood sugar is 139.  The projected HbA1c for the next  3 months (GMI) is 6.6%. -Reviewing the CGM trends, sugars are very well controlled overnight, with post brunch increase in blood sugars, after which the sugars again decrease, followed by another increase in blood sugars after dinner.  Upon questioning, patient is taking his 50/50 Humalog after he eats brunch.  We discussed that this insulin needs to be taken 15 minutes before the meal.  Moving it 15 minutes before brunch, may alleviate the hyperglycemia after this meal.  Also, it may eliminate the more fluctuating blood sugars after the meal and hopefully also the increase in blood sugars after dinner.  Since his sugars are at goal overnight, I am afraid that adding another dose of Humalog 50/50 before dinner may drop his blood sugars too low.  We did discuss that this is usually twice a day insulin, but for now, we can continue it just once a day, while in the future, he may need extra dinner coverage. -He mentions that he has been on this insulin for 10 years and would prefer to continue on it.  Per review of the chart, this was approved by his insurance this morning.  He was previously obtaining it from the patient assistance program. -Regarding Trulicity, he mentions that he did not like how he felt on it so this was stopped 3 months ago.  For now, we will continue without it but it would have been excellent to be able to continue.  He may be amenable to try another GLP-1 receptor agonist in the near future. 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  He has no problems with using pens. - I suggested to:  Patient Instructions  Please use the following regimen: - Humalog 50/50 22 units 15 min before brunch  Please return in 3-4 months.  - check sugars at different times of the day - check 4x a day, rotating checks - discussed about CBG targets for treatment: 80-130 mg/dL before meals and <180 mg/dL after meals; target HbA1c <7%. - given sugar log and advised how  to fill it and to bring it at next appt  - given foot care handout  - given instructions for hypoglycemia management "15-15 rule"  - advised for yearly eye exams  - Return to clinic in 3-4 mo   2. HL - Reviewed latest lipid panel from 04/2021: Fractions at goal - Continues Lipitor 40 mg daily without side effects. -He has another visit at the [place] next month.  He will have labs at that time.  Philemon Kingdom, MD PhD [facility] Endocrinology

## 2022-04-16 NOTE — Patient Instructions (Addendum)
Please use the following regimen: - Humalog 50/50 22 units 15 min before brunch  Please return in 3-4 months.  PATIENT INSTRUCTIONS FOR TYPE 2 DIABETES:  DIET AND EXERCISE Diet and exercise is an important part of diabetic treatment.  We recommended aerobic exercise in the form of brisk walking (working between 40-60% of maximal aerobic capacity, similar to brisk walking) for 150 minutes per week (such as 30 minutes five days per week) along with 3 times per week performing 'resistance' training (using various gauge rubber tubes with handles) 5-10 exercises involving the major muscle groups (upper body, lower body and core) performing 10-15 repetitions (or near fatigue) each exercise. Start at half the above goal but build slowly to reach the above goals. If limited by weight, joint pain, or disability, we recommend daily walking in a swimming pool with water up to waist to reduce pressure from joints while allow for adequate exercise.    BLOOD GLUCOSES Monitoring your blood glucoses is important for continued management of your diabetes. Please check your blood glucoses 2-4 times a day: fasting, before meals and at bedtime (you can rotate these measurements - e.g. one day check before the 3 meals, the next day check before 2 of the meals and before bedtime, etc.).   HYPOGLYCEMIA (low blood sugar) Hypoglycemia is usually a reaction to not eating, exercising, or taking too much insulin/ other diabetes drugs.  Symptoms include tremors, sweating, hunger, confusion, headache, etc. Treat IMMEDIATELY with 15 grams of Carbs: 4 glucose tablets  cup regular juice/soda 2 tablespoons raisins 4 teaspoons sugar 1 tablespoon honey Recheck blood glucose in 15 mins and repeat above if still symptomatic/blood glucose <100.  RECOMMENDATIONS TO REDUCE YOUR RISK OF DIABETIC COMPLICATIONS: * Take your prescribed MEDICATION(S) * Follow a DIABETIC diet: Complex carbs, fiber rich foods, (monounsaturated and  polyunsaturated) fats * AVOID saturated/trans fats, high fat foods, >2,300 mg salt per day. * EXERCISE at least 5 times a week for 30 minutes or preferably daily.  * DO NOT SMOKE OR DRINK more than 1 drink a day. * Check your FEET every day. Do not wear tightfitting shoes. Contact us if you develop an ulcer * See your EYE doctor once a year or more if needed * Get a FLU shot once a year * Get a PNEUMONIA vaccine once before and once after age 59 years  GOALS:  * Your Hemoglobin A1c of <7%  * fasting sugars need to be <130 * after meals sugars need to be <180 (2h after you start eating) * Your Systolic BP should be 290 or lower  * Your Diastolic BP should be 80 or lower  * Your HDL (Good Cholesterol) should be 40 or higher  * Your LDL (Bad Cholesterol) should be 100 or lower. * Your Triglycerides should be 150 or lower  * Your Urine microalbumin (kidney function) should be <30 * Your Body Mass Index should be 25 or lower

## 2022-04-16 NOTE — Telephone Encounter (Signed)
Patient Advocate Encounter  Prior Authorization for HumaLOG Mix 50/50 KwikPen (50-50) 100UNIT/ML pen-injectors has been approved.    EOC [id number] Effective dates: 04/16/2022 through 11/21/2022

## 2022-04-20 ENCOUNTER — Telehealth: Payer: Self-pay

## 2022-04-20 MED ORDER — INSULIN ASPART PROT & ASPART (70-30 MIX) 100 UNIT/ML ~~LOC~~ SUSP: SUBCUTANEOUS | 0 refills | Status: DC

## 2022-04-20 NOTE — Telephone Encounter (Signed)
Patient given a sample of the Novolog 70/30 mix until his  Humalog mail order arrives. This was okayed by provider and documented in book.

## 2022-04-22 ENCOUNTER — Telehealth: Payer: Self-pay

## 2022-04-22 NOTE — Chronic Care Management (AMB) (Signed)
Chronic Care Management Pharmacy Assistant   Name: [name]  MRN: [id number] DOB: [birth year]    Reason for Encounter: Disease State   Conditions to be addressed/monitored: DMII  Recent office visits:  04/16/22 Philemon Kingdom, MD-Internal Medicine (Poorly controlled diabetes mellitus) Orders: POCT HgB A1C  Recent consult visits:  04/07/22 Gardiner Barefoot, DPM-Podiatry (Nail Problem) No orders or medication changes  Hospital visits:  Medication Reconciliation was completed by comparing discharge summary, patient's EMR and Pharmacy list, and upon discussion with patient.  Admitted to the hospital on 03/29/22 due to Chills. Discharge date was 03/29/22. Discharged from [facility] [place]?Medications Started at [facility] Discharge:?? -started Doxycycline Hyclate 100 mg 2 times daily    Medications that remain the same after Hospital Discharge:??  -All other medications will remain the same.    Medications: Outpatient Encounter Medications as of 04/22/2022  Medication Sig Note   amLODipine (NORVASC) 5 MG tablet Take 5 mg by mouth daily.    aspirin EC 81 MG tablet Take 1 tablet (81 mg total) by mouth daily. Swallow whole.    atorvastatin (LIPITOR) 40 MG tablet TAKE 1 TABLET EVERY DAY    Blood Pressure Monitoring (BLOOD PRESSURE CUFF) MISC 1 Units by Does not apply route daily.    calcium carbonate (TUMS - DOSED IN MG ELEMENTAL CALCIUM) 500 MG chewable tablet Chew 1 tablet by mouth daily as needed for indigestion or heartburn.    carvedilol (COREG) 12.5 MG tablet TAKE 1 AND 1/2 TABLETS TWICE DAILY    chlorthalidone (HYGROTON) 25 MG tablet TAKE 1 TABLET EVERY DAY    clopidogrel (PLAVIX) 75 MG tablet Take 1 tablet (75 mg total) by mouth daily.    Continuous Blood Gluc Sensor (FREESTYLE LIBRE 2 SENSOR) MISC 1 Device by Does not apply route every 14 (fourteen) days.    doxycycline (VIBRAMYCIN) 100 MG capsule Take 1 capsule (100 mg total) by  mouth 2 (two) times daily.    furosemide (LASIX) 40 MG tablet TAKE 1 TABLET (40 MG TOTAL) BY MOUTH DAILY. NEED APPT. 12/08/2021: prn   glucose blood (ACCU-CHEK AVIVA PLUS) test strip uad tid    insulin aspart protamine- aspart (NOVOLOG MIX 70/30) (70-30) 100 UNIT/ML injection Exp: 09/21/22 Lot: [id number]    Insulin Lispro Prot & Lispro (HUMALOG MIX 50/50 KWIKPEN) (50-50) 100 UNIT/ML Kwikpen Inject 24 Units into the skin daily with breakfast.    Insulin Pen Needle 32G X 4 MM MISC Use 1x a day    losartan (COZAAR) 100 MG tablet TAKE 1 TABLET EVERY DAY    Microlet Lancets MISC UAD to check sugars TID.  E11.22    nitroGLYCERIN (NITROSTAT) 0.4 MG SL tablet Place 1 tablet (0.4 mg total) under the tongue every 5 (five) minutes as needed for chest pain (up to 3 doses). 12/08/2021: Has on hand   senna-docusate (SENOKOT-S) 8.6-50 MG per tablet Take 2 tablets by mouth 2 (two) times daily. For constipation.    No facility-administered encounter medications on file as of 04/22/2022.   Recent Relevant Labs: Lab Results  Component Value Date/Time   HGBA1C 7.4 (A) 04/16/2022 03:40 PM   HGBA1C 6.9 (A) 01/04/2022 08:57 AM   HGBA1C 7.7 (H) 08/28/2018 10:12 AM   HGBA1C 9.3 (H) 02/24/2018 11:09 AM   MICROALBUR 7.5 (H) 08/17/2016 10:30 AM   MICROALBUR 9.6 (H) 08/07/2015 11:17 AM    Kidney Function Lab Results  Component Value Date/Time   CREATININE 2.09 (H) 12/08/2021 10:25  AM   CREATININE 2.29 (H) 09/10/2020 02:37 PM   GFR 41.31 (L) 04/15/2020 11:28 AM   GFRNONAA 33 (L) 12/08/2021 10:25 AM   GFRAA 32 (L) 09/10/2020 02:37 PM    Reviewed chart for medication changes and drug therapy problems ahead of medication adherence call.  Attempted to contact patient x 3 for medication review and health check, unable to reach patient, left voicemails to return call.    Current antihyperglycemic regimen:  Humalog mix 50/50 24 units with breakfast   Adherence Review: Is the patient currently on a STATIN  medication? Yes Is the patient currently on ACE/ARB medication? Yes Does the patient have >5 day gap between last estimated fill dates? No   Care Gaps: Colonoscopy - 01/02/2021 Diabetic Foot Exam - 04/07/22 Ophthalmology - 1/22 (Patient stated) Dexa Scan - NA Annual Well Visit - 01/28/2021 Micro albumin - NA Hemoglobin A1c - 04/16/22   Star Rating Drugs: Atorvastatin - filled 04/07/22 90 ds Losartan - last fill 03/11/22 90 ds   [place] Pharmacist Assistant [phone]

## 2022-05-04 ENCOUNTER — Encounter: Payer: Medicare HMO | Attending: Endocrinology | Admitting: Dietician

## 2022-05-04 ENCOUNTER — Encounter: Payer: Self-pay | Admitting: Dietician

## 2022-05-04 DIAGNOSIS — E118 Type 2 diabetes mellitus with unspecified complications: Secondary | ICD-10-CM | POA: Insufficient documentation

## 2022-05-04 NOTE — Patient Instructions (Addendum)
Continue to stay active most days.  Great job on getting back to [employer].  Monitor your blood sugar.  When it is high in the morning, what did you eat for breakfast?  Was it fast food that was higher in fat? Did you take your insulin at the right time?   Double check your blood glucose with a finger stick when you have a low on the [place].  Treat if you have symptoms or your finger stick is less than 70 with 4 ounces of juice.  If you continue to have low blood glucose (verified by finger stick), call Dr. Arman Filter office.   Continue to take your insulin 15 minutes before your meal   Eat a diet that is low in saturated fat. Whole grains rather than refined grains, plenty of vegetables, and choose fresh fruit.

## 2022-05-04 NOTE — Progress Notes (Signed)
Diabetes Self-Management Education  Visit Type: Follow-up  Appt. Start Time: 0805 Appt. End Time: 0845 Patient is here today alone.  He was last seen by this RD 03/08/2022 05/04/2022  [name], identified by name and date of birth, is a 71 y.o. male with a diagnosis of Diabetes:  .   ASSESSMENT He is still using the [place] 2 but it is currently off and he is waiting for more sensors. He reports more frequent lows.  Recommended checking his blood glucose with a finger stick.  Patient states that he agrees that some of the lows are false lows. He does not have strips or lancets.  Discussed that he does have prescriptions for these.  Called patient to see if he was able to pick them up this morning but patient did not answer and mailbox is full. He is now taking the Humalog 50/50 insulin 15 minutes before breakfast Instructed patient to call MD for frequent low blood glucose.  Libre reviewed.  He is 88% in range (improved from 82% 04/16/2022), but with more frequent lows (6% <70), 5% 181-240 and 1% >240 in the past 2 weeks with average blood glucose of 117 in the past week and 133 in the past 14 days.  He was sick with Bronchitis recently for the past 3 weeks and took a month off but he has resumed going to the [facility] 3 days per week.     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  [employer] 3 times weekly for cardio  exercise, weights, stretches for 1.5 hours.   Patient lives with his daughter and 2 grandchildren.  She does the shopping and cooking. He is retired from [employer].  He moved here in 2003. Avoids red meat.  Allergic to shellfish.  Avoids added salt.  Daughter cooks with very little salt.  Stopped eating red meat. Weight 209 lb (94.8 kg). Body mass index is 29.15 kg/m.   Diabetes Self-Management Education - 05/04/22 1557       Visit Information   Visit Type Follow-up      Initial Visit   Are you taking your medications as prescribed? Yes      Psychosocial Assessment   Self-management support Doctor's office;CDE visits    Other persons present Patient    Patient Concerns Monitoring;Nutrition/Meal planning;Glycemic Control      Pre-Education Assessment   Patient understands the diabetes disease and treatment process. Comprehends key points    Patient understands incorporating nutritional management into lifestyle. Needs Review    Patient undertands incorporating physical activity into lifestyle. Demonstrates understanding / competency    Patient understands using medications safely. Demonstrates understanding / competency    Patient understands monitoring blood glucose, interpreting and using results Comprehends key points    Patient understands prevention, detection, and treatment of acute complications. Demonstrates understanding / competency    Patient understands prevention, detection, and treatment  of chronic complications. Demonstrates understanding / competency    Patient understands how to develop strategies to address psychosocial issues. Demonstrates understanding / competency    Patient understands how to develop strategies to promote health/change behavior. Comprehends key points      Complications   Last HgB A1C per patient/outside source 7.4 %   03/2022   How often do you check your blood sugar? > 4 times/day      Dietary Intake   Breakfast cereal,  fruit, toast x2 and PB OR fast food - [place] sausage, egg, English Muffin    Lunch skips    Dinner chicken or fish with vegetables OR honey, PB sandwich on [employer], fresh dates OR [place] food with vegetables    Snack (evening) occasional fruit    Beverage(s) water, almond milk, rare sweet or unsweetened tea      Activity / Exercise   Activity / Exercise Type Light (walking / raking leaves)    How many days per week do you exercise? 3    How many minutes per day do you exercise? 90    Total minutes per week of exercise 270      Patient Education   Previous Diabetes Education Yes (please comment)   02/2022   Healthy Eating Meal options for control of blood glucose level and chronic complications.    Medications Reviewed patients medication for diabetes, action, purpose, timing of dose and side effects.    Monitoring Taught/evaluated CGM (comment)      Individualized Goals (developed by patient)   Nutrition General guidelines for healthy choices and portions discussed    Physical Activity Exercise 3-5 times per week;60 minutes per day    Medications take my medication as prescribed    Monitoring  Consistenly use CGM    Reducing Risk examine blood glucose patterns      Patient Self-Evaluation of Goals - Patient rates self as meeting previously set goals (% of time)   Nutrition >75% (most of the time)    Physical Activity >75% (most of the time)    Medications >75% (most of the time)    Monitoring >75% (most of the time)    Problem Solving and behavior change strategies  >75% (most of the time)    Reducing Risk (treating acute and chronic complications) >67% (most of the time)    Health Coping >75% (most of the time)      Post-Education Assessment   Patient understands the diabetes disease and treatment process. Demonstrates understanding / competency    Patient understands incorporating nutritional management into lifestyle. Comprehends key points    Patient undertands incorporating  physical activity into lifestyle. Demonstrates understanding / competency    Patient understands using medications safely. Demonstrates understanding / competency    Patient understands monitoring blood glucose, interpreting and using results Demonstrates understanding / competency    Patient understands prevention, detection, and treatment of acute complications. Demonstrates understanding / competency    Patient understands prevention, detection, and treatment of chronic complications. Demonstrates understanding / competency    Patient understands how to develop strategies to address psychosocial issues. Demonstrates understanding / competency    Patient understands how to develop strategies to promote health/change behavior. Comprehends key points      Outcomes   Future DMSE 2 months    Program Status Not Completed      Subsequent Visit   Since your last visit have you continued or begun to take your medications as prescribed? Yes  Since your last visit have you experienced any weight changes? No change    Since your last visit, are you checking your blood glucose at least once a day? Yes             Individualized Plan for Diabetes Self-Management Training:   Learning Objective:  Patient will have a greater understanding of diabetes self-management. Patient education plan is to attend individual and/or group sessions per assessed needs and concerns.   Plan:   Patient Instructions  Continue to stay active most days.  Great job on getting back to [employer].  Monitor your blood sugar.  When it is high in the morning, what did you eat for breakfast?  Was it fast food that was higher in fat? Did you take your insulin at the right time?   Double check your blood glucose with a finger stick when you have a low on the [place].  Treat if you have symptoms or your finger stick is less than 70 with 4 ounces of juice.  If you continue to have low blood glucose (verified by finger  stick), call Dr. Arman Filter office.   Continue to take your insulin 15 minutes before your meal   Eat a diet that is low in saturated fat. Whole grains rather than refined grains, plenty of vegetables, and choose fresh fruit.  Expected Outcomes:     Education material provided:   If problems or questions, patient to contact team via:  Phone  Future DSME appointment: 2 months

## 2022-06-09 ENCOUNTER — Ambulatory Visit (INDEPENDENT_AMBULATORY_CARE_PROVIDER_SITE_OTHER): Payer: Medicare HMO | Admitting: Family Medicine

## 2022-06-09 ENCOUNTER — Encounter: Payer: Self-pay | Admitting: Family Medicine

## 2022-06-09 VITALS — BP 130/66 | HR 75 | Temp 97.7°F | Ht 71.0 in | Wt 210.0 lb

## 2022-06-09 DIAGNOSIS — R051 Acute cough: Secondary | ICD-10-CM | POA: Diagnosis not present

## 2022-06-09 DIAGNOSIS — R197 Diarrhea, unspecified: Secondary | ICD-10-CM | POA: Diagnosis not present

## 2022-06-09 DIAGNOSIS — J22 Unspecified acute lower respiratory infection: Secondary | ICD-10-CM | POA: Diagnosis not present

## 2022-06-09 DIAGNOSIS — R61 Generalized hyperhidrosis: Secondary | ICD-10-CM | POA: Diagnosis not present

## 2022-06-09 LAB — POC COVID19 BINAXNOW: SARS Coronavirus 2 Ag: NEGATIVE

## 2022-06-09 MED ORDER — BENZONATATE 200 MG PO CAPS: 200.0000 mg | ORAL_CAPSULE | Freq: Two times a day (BID) | ORAL | 0 refills | Status: DC | PRN

## 2022-06-09 MED ORDER — AZITHROMYCIN 250 MG PO TABS: ORAL_TABLET | ORAL | 0 refills | Status: AC

## 2022-06-09 NOTE — Progress Notes (Signed)
Subjective:  [name] is a 71 y.o. male who presents for a 5-6 day hx of scratchy throat and cough. Having night sweats. Having 2-3 episodes of diarrhea per day x same time frame.  His grandson lives in the same house and has same illness.   Denies fever, chills, dizziness, chest pain, palpitations, shortness of breath, LE edema.    Does not smoke.  No asthma or COPD  Treatment to date: drinking tea and honey.  No other aggravating or relieving factors.  No other c/o.  ROS as in subjective.  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   Chronic kidney disease    Constipation    takes Carafate four times day   DIAB W/UNSPEC COMP TYPE II/UNSPEC TYPE UNCNTRL    ERECTILE DYSFUNCTION    GERD    HYPERLIPIDEMIA-MIXED    HYPERTENSION, BENIGN    LBBB (left bundle branch block) 02/02/2016   MORTON'S NEUROMA, RIGHT    OSA on CPAP 09/09/2020   Peripheral neuropathy    Seasonal allergies    takes Allegra and Benadryl daily prn;uses Flonase daily   SHOULDER PAIN, RIGHT    Snoring 09/09/2020   Stroke, 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, s/p CIR    TIA on medication 02/2012   Unstable angina ([place]) 07/09/2020      Objective: Vitals:   06/09/22 1620  BP: 130/66  Pulse: 75  Temp: 97.7 F (36.5 C)  SpO2: 99%    General appearance: Alert, WD/WN, no distress, mildly ill appearing                             Skin: warm, no rash                           Head: no sinus tenderness                            Eyes: conjunctiva normal, corneas clear, PERRLA                            Ears: pearly TMs, external ear canals normal                          Nose: mask on             Mouth/throat: mask on                            Neck: supple, no adenopathy, no thyromegaly, nontender                           Heart: RRR                         Lungs: CTA bilaterally   Abdomen: soft, non distended, non tender      Assessment: Lower respiratory infection - Plan: azithromycin (ZITHROMAX) 250 MG tablet, benzonatate (TESSALON) 200 MG capsule  Acute cough - Plan:  POC COVID-19  Night sweats - Plan: POC COVID-19  Diarrhea, unspecified type   Plan: Negative Covid test Azithromycin and Tessalon prescribed.   Suggested symptomatic OTC remedies. Discussed good hydration and may take Imodium for diarrhea.  Follow up if not improving or if worsening. May need labs or chest XR if not improving.

## 2022-06-09 NOTE — Patient Instructions (Addendum)
Take the antibiotic as prescribed.  I also prescribed a medication you can take for cough.  You could also take over-the-counter plain Mucinex without any decongestant.  You take Tylenol for pain.  Follow-up if you are getting worse over the next 2 to 3 days or if you are not improving in the next week.

## 2022-06-15 ENCOUNTER — Other Ambulatory Visit: Payer: Self-pay | Admitting: Internal Medicine

## 2022-06-15 DIAGNOSIS — Z794 Long term (current) use of insulin: Secondary | ICD-10-CM

## 2022-06-16 ENCOUNTER — Encounter (INDEPENDENT_AMBULATORY_CARE_PROVIDER_SITE_OTHER): Payer: Medicare HMO | Admitting: Ophthalmology

## 2022-06-16 DIAGNOSIS — H35033 Hypertensive retinopathy, bilateral: Secondary | ICD-10-CM | POA: Diagnosis not present

## 2022-06-16 DIAGNOSIS — E113293 Type 2 diabetes mellitus with mild nonproliferative diabetic retinopathy without macular edema, bilateral: Secondary | ICD-10-CM | POA: Diagnosis not present

## 2022-06-16 DIAGNOSIS — H43813 Vitreous degeneration, bilateral: Secondary | ICD-10-CM | POA: Diagnosis not present

## 2022-06-16 DIAGNOSIS — I10 Essential (primary) hypertension: Secondary | ICD-10-CM

## 2022-06-16 DIAGNOSIS — H35373 Puckering of macula, bilateral: Secondary | ICD-10-CM | POA: Diagnosis not present

## 2022-07-06 ENCOUNTER — Telehealth: Payer: Self-pay

## 2022-07-06 NOTE — Telephone Encounter (Signed)
Inbound call from pt requesting form be completed and faxed with clinical notes. DME supplies ordered via [employer] through online portal.

## 2022-07-09 ENCOUNTER — Ambulatory Visit (INDEPENDENT_AMBULATORY_CARE_PROVIDER_SITE_OTHER): Payer: Medicare HMO | Admitting: Podiatry

## 2022-07-09 ENCOUNTER — Encounter: Payer: Self-pay | Admitting: Podiatry

## 2022-07-09 DIAGNOSIS — M79675 Pain in left toe(s): Secondary | ICD-10-CM | POA: Diagnosis not present

## 2022-07-09 DIAGNOSIS — E1142 Type 2 diabetes mellitus with diabetic polyneuropathy: Secondary | ICD-10-CM | POA: Diagnosis not present

## 2022-07-09 DIAGNOSIS — M79674 Pain in right toe(s): Secondary | ICD-10-CM | POA: Diagnosis not present

## 2022-07-09 DIAGNOSIS — B351 Tinea unguium: Secondary | ICD-10-CM | POA: Diagnosis not present

## 2022-07-09 DIAGNOSIS — M792 Neuralgia and neuritis, unspecified: Secondary | ICD-10-CM

## 2022-07-09 NOTE — Progress Notes (Signed)
This patient returns to my office for at risk foot care.  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  Patient is taking plavix.  Patient has history of CVA.  This patient is unable to cut nails himself since the patient cannot reach his nails.These nails are painful walking and wearing shoes. He says today he finds himself losing his balance in his left leg and experiences hip pain. This patient presents for at risk foot care today.  General Appearance  Alert, conversant and in no acute stress.  Vascular  Dorsalis pedis and posterior tibial  pulses are weakly  palpable  bilaterally.  Capillary return is within normal limits  Bilaterally.Cold feet left.  .  Absent digital hair  B/L.  Neurologic  Senn-Weinstein monofilament wire test diminished  bilaterally. Muscle power within normal limits bilaterally.  Nails Thick disfigured discolored nails with subungual debris  from hallux to fifth toes bilaterally. No evidence of bacterial infection or drainage bilaterally.  Orthopedic  No limitations of motion  feet .  No crepitus or effusions noted.  No bony pathology or digital deformities noted.  Skin  normotropic skin with no porokeratosis noted bilaterally.  No signs of infections or ulcers noted.  Midfoot DJD right.  Hammer toes  B/L.   Onychomycosis  Pain in right toes  Pain in left toes  Neuropathic pain.  Consent was obtained for treatment procedures.   Mechanical debridement of nails 1-5  bilaterally performed with a nail nipper.  Filed with dremel without incident.  Dispense powerstep insole to help with heel pain and his balance.  Told him that he may need another consult.   Return office visit   3 months                   Told patient to return for periodic foot care and evaluation due to potential at risk complications.   Gardiner Barefoot DPM

## 2022-07-19 ENCOUNTER — Telehealth: Payer: Self-pay | Admitting: Cardiovascular Disease

## 2022-07-19 MED ORDER — CLOPIDOGREL BISULFATE 75 MG PO TABS: 75.0000 mg | ORAL_TABLET | Freq: Every day | ORAL | 0 refills | Status: DC

## 2022-07-19 NOTE — Telephone Encounter (Signed)
*  STAT* If patient is at the pharmacy, call can be transferred to refill team.   1. Which medications need to be refilled? (please list name of each medication and dose if known)   clopidogrel (PLAVIX) 75 MG tablet    2. Which pharmacy/location (including street and city if local pharmacy) is medication to be sent to? [place], [place].  3. Do they need a 30 day or 90 day supply?  30 day

## 2022-07-19 NOTE — Telephone Encounter (Signed)
Rx(s) sent to pharmacy electronically.  

## 2022-07-27 ENCOUNTER — Telehealth: Payer: Medicare HMO

## 2022-08-02 ENCOUNTER — Encounter: Payer: Medicare HMO | Attending: Endocrinology | Admitting: Dietician

## 2022-08-02 ENCOUNTER — Encounter: Payer: Self-pay | Admitting: Dietician

## 2022-08-02 DIAGNOSIS — E1165 Type 2 diabetes mellitus with hyperglycemia: Secondary | ICD-10-CM | POA: Insufficient documentation

## 2022-08-02 DIAGNOSIS — E1159 Type 2 diabetes mellitus with other circulatory complications: Secondary | ICD-10-CM | POA: Diagnosis present

## 2022-08-02 NOTE — Patient Instructions (Addendum)
Scan your Elenor Legato more consistently: When you wake Before each meal and possibly 2 hours after Before bed  Start back at the [facility].  Start Wednesday??  Bike, stretching, weight machines Helps lower your blood glucose and maintain your strength  Consider taking 600 mg ALA (Alpha Lipoc Acid) twice per day   Rethink what you drink:  Drink more water and avoid sweet tea  Avoid creamer and return to almond milk  Recommend cooking dinner 3 times per week. Find ways to add more vegetables every day.

## 2022-08-02 NOTE — Progress Notes (Signed)
Patient is here today alone.  He was last seen by this RD 05/04/2022.    Diabetes Self-Management Education  Visit Type: Follow-up  Appt. Start Time: 1015 Appt. End Time: 1050  08/02/2022  [name], identified by name and date of birth, is a 71 y.o. male with a diagnosis of Diabetes:  .   ASSESSMENT Complains that he is more stiff than usual and balance is worse.  Appears to be shaking worse today. He has stopped going to the [facility].  Gotten out of the habit.  States that he is going to have dental implants soon and wants to wait until after he is through with this.  Libre 2 data:  Time in range for the past 7 days:  52%, >180 47% and 1% very high.  No lows.  He was 71% in range for 90 days.  History includes Type 2 Diabetes, HTN, HLD, CVA (2013) neuropathy, CKD Medications include:  Humalog Mix 50/50 - 22 units once daily, lasix (prn), (stopped trulicity and stated it did not work) Labs noted to include: A1C 7.4% 03/2022 increased from 6.9% 01/04/2022, GFR 33, BUN 32, Creatinine 2.09, Potassium 3.8 on  12/08/2021 CGM:  FreeStyle Libre 2 - Started today - using phone app - showed him overpatches that he can purchase online   Estimated protein needs:  75 grams   Weight hx: 212 lbs 08/02/2022 209 lbs [id number] 209 lbs 01/04/2022 214 lbs 12/08/2021 Lost weight since he started to exercise.  [employer] 3 times weekly for cardio exercise, weights, stretches for 1.5 hours.   Patient lives with his daughter and 2 grandchildren.  His daughter is having another grandchild.  They share shopping and cooking. "But mostly order out" He is retired from [employer].  He moved here in 2003. Avoids red meat.  Allergic to shellfish.  Avoids added salt.  Daughter cooks with very little salt.  Stopped eating red meat. Wakes late 11 am. There were no vitals taken for this visit. There is no height or weight on file to calculate BMI.   Diabetes Self-Management Education -  08/02/22 1100       Visit Information   Visit Type Follow-up      Initial Visit   Are you taking your medications as prescribed? Yes      Pre-Education Assessment   Patient understands the diabetes disease and treatment process. Comprehends key points    Patient understands incorporating nutritional management into lifestyle. Needs Review    Patient undertands incorporating physical activity into lifestyle. Demonstrates understanding / competency    Patient understands using medications safely. Demonstrates understanding / competency    Patient understands monitoring blood glucose, interpreting and using results Comprehends key points    Patient understands prevention, detection, and treatment of acute complications. Demonstrates understanding / competency    Patient understands prevention, detection, and treatment of chronic complications. Demonstrates understanding / competency    Patient understands how to develop strategies to address psychosocial issues. Comprehends key points    Patient understands how to develop strategies to promote health/change behavior. Comprehends key points      Complications   How often do you check your blood sugar? > 4 times/day    Fasting Blood glucose range (mg/dL) 130-179;180-200    Postprandial Blood glucose range (mg/dL) >200    Number of hypoglycemic episodes per month 1      Dietary Intake   Breakfast frosted mini wheats with regular sweetened coffee creamer OR egg with  jalapeno, 2 slices toast    Lunch skips    Snack (afternoon) PB sandiwch, fruit, graham crackers and PB OR collar greens    Dinner vege chik payy on bagel, sweet tea    Snack (evening) banana    Beverage(s) water, sweet tea, reg sweet coffee creamer      Activity / Exercise   Activity / Exercise Type Light (walking / raking leaves)    How many days per week do you exercise? 5    How many minutes per day do you exercise? 10    Total minutes per week of exercise 50       Patient Education   Previous Diabetes Education Yes (please comment)    Healthy Eating Meal options for control of blood glucose level and chronic complications.;Other (comment)   beverage choices   Medications Reviewed patients medication for diabetes, action, purpose, timing of dose and side effects.    Monitoring Taught/evaluated CGM (comment)    Chronic complications Relationship between chronic complications and blood glucose control    Diabetes Stress and Support Identified and addressed patients feelings and concerns about diabetes;Worked with patient to identify barriers to care and solutions;Role of stress on diabetes      Individualized Goals (developed by patient)   Nutrition General guidelines for healthy choices and portions discussed    Physical Activity Exercise 3-5 times per week;45 minutes per day    Medications take my medication as prescribed    Monitoring  Consistenly use CGM    Reducing Risk treat hypoglycemia with 15 grams of carbs if blood glucose less than '70mg'$ /dL;examine blood glucose patterns      Patient Self-Evaluation of Goals - Patient rates self as meeting previously set goals (% of time)   Nutrition 50 - 75 % (half of the time)    Physical Activity < 25% (hardly ever/never)    Medications >75% (most of the time)    Monitoring 50 - 75 % (half of the time)    Problem Solving and behavior change strategies  25 - 50% (sometimes)    Reducing Risk (treating acute and chronic complications) 50 - 75 % (half of the time)    Health Coping 50 - 75 % (half of the time)      Post-Education Assessment   Patient understands the diabetes disease and treatment process. Demonstrates understanding / competency    Patient understands incorporating nutritional management into lifestyle. Needs Review    Patient undertands incorporating physical activity into lifestyle. Demonstrates understanding / competency    Patient understands using medications safely. Demonstrates  understanding / competency    Patient understands monitoring blood glucose, interpreting and using results Demonstrates understanding / competency    Patient understands prevention, detection, and treatment of acute complications. Demonstrates understanding / competency    Patient understands prevention, detection, and treatment of chronic complications. Demonstrates understanding / competency    Patient understands how to develop strategies to address psychosocial issues. Demonstrates understanding / competency    Patient understands how to develop strategies to promote health/change behavior. Comprehends key points      Outcomes   Expected Outcomes Demonstrated interest in learning but significant barriers to change    Future DMSE 2 months    Program Status Not Completed      Subsequent Visit   Since your last visit have you continued or begun to take your medications as prescribed? Yes    Since your last visit have you experienced any weight changes? Gain  Weight Gain (lbs) 3    Since your last visit, are you checking your blood glucose at least once a day? Yes             Individualized Plan for Diabetes Self-Management Training:   Learning Objective:  Patient will have a greater understanding of diabetes self-management. Patient education plan is to attend individual and/or group sessions per assessed needs and concerns.   Plan:   Patient Instructions  Scan your Elenor Legato more consistently: When you wake Before each meal and possibly 2 hours after Before bed  Start back at the [facility].  Start Wednesday??  Bike, stretching, weight machines Helps lower your blood glucose and maintain your strength  Consider taking 600 mg ALA (Alpha Lipoc Acid) twice per day   Rethink what you drink:  Drink more water and avoid sweet tea  Avoid creamer and return to almond milk  Recommend cooking dinner 3 times per week. Find ways to add more vegetables every day.     Expected  Outcomes:  Demonstrated interest in learning but significant barriers to change  Education material provided:   If problems or questions, patient to contact team via:  Phone  Future DSME appointment: 2 months

## 2022-08-16 NOTE — Progress Notes (Unsigned)
Patient ID: [name], male   DOB: 1951/06/01, 71 y.o.   MRN: [id number]  HPI: [name] is a 71 y.o.-year-old male, returning for follow-up for DM2, dx in 2007, insulin-dependent since 2011, uncontrolled, with complications (CAD, stroke, CKD, DR, ED, PN). Pt. previously saw Dr. Loanne Drilling, last visit with me 4 months ago.  Reviewed HbA1c: Lab Results  Component Value Date   HGBA1C 7.4 (A) 04/16/2022   HGBA1C 6.9 (A) 01/04/2022   HGBA1C 6.8 (A) 11/03/2021   HGBA1C 6.6 (A) 08/18/2021   HGBA1C 6.7 (A) 05/11/2021   HGBA1C 6.6 (A) 02/05/2021   HGBA1C 7.4 (A) 10/07/2020   HGBA1C 7.1 (A) 08/08/2020   HGBA1C 7.5 (A) 06/13/2020   HGBA1C 9.0 (A) 04/10/2020   Pt is on a regimen of: - Humalog 50/50 (prev. From PAP, now covered by the new insurance w/o a PA) 22 units after brunch >> moved to 15 minutes before brunch -  -stopped  in 12/2021 because feeling "weird, sweaty" He has significant tremors and cannot draw insulin from vials.  Pt checks his sugars >4x a day and they are:  Previously:   Lowest sugar was 54; he has hypoglycemia awareness at 70.  Highest sugar was 264.  Glucometer: Accu-Chek  She usually has 2 meals: Brunch and dinner.  - + CKD, last BUN/creatinine:  Lab Results  Component Value Date   BUN 34 (H) 12/08/2021   BUN 41 (H) 09/10/2020   CREATININE 2.09 (H) 12/08/2021   CREATININE 2.29 (H) 09/10/2020  On Cozaar 100 mg daily.  - + HL; last set of lipids: CHOLESTEROL  106  50 - 200 04/24/2021  TRIGLYCERIDE  107  30 - 200 04/24/2021  CHOLESTEROL IN HDL  60  29.0 - 71.0 04/24/2021  CHOLESTEROL IN LDL  25  60 - 130 04/24/2021  Previously: Lab Results  Component Value Date   CHOL 112 04/15/2020   HDL 44.60 04/15/2020   LDLCALC 40 04/15/2020   LDLDIRECT 63.0 01/12/2016   TRIG 136.0 04/15/2020   CHOLHDL 3 04/15/2020  On Lipitor 40 mg daily.  - last eye exam was in 11/2020: + DR reportedly. + Cataracts. He has a new appt coming up in  05/2022.  On ASA 81.  - no numbness and tingling in his feet.  Last foot exam 07/09/2022 by Dr. Prudence Davidson.  ROS: + see HPI No increased urination, blurry vision, nausea, chest pain.  Past Medical History:  Diagnosis Date   ALLERGIC RHINITIS    CAD, NATIVE VESSEL    BMS to [place] 2001, DES to BMS 2005   Chronic back pain    herniated disc   Chronic combined systolic and diastolic heart failure ([place]) 06/29/2016   Chronic kidney disease    Constipation    takes Carafate four times day   DIAB W/UNSPEC COMP TYPE II/UNSPEC TYPE UNCNTRL    ERECTILE DYSFUNCTION    GERD    HYPERLIPIDEMIA-MIXED    HYPERTENSION, BENIGN    LBBB (left bundle branch block) 02/02/2016   MORTON'S NEUROMA, RIGHT    OSA on CPAP 09/09/2020   Peripheral neuropathy    Seasonal allergies    takes Allegra and Benadryl daily prn;uses Flonase daily   SHOULDER PAIN, RIGHT    Snoring 09/09/2020   Stroke, 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   L HP + hemiparesis, s/p CIR    TIA on medication 02/2012   Unstable angina ([place]) 07/09/2020   Past Surgical History:  Procedure Laterality Date   ANGIOPLASTY     COLONOSCOPY  CORONARY ANGIOPLASTY  2005   2 stents   CORONARY STENT INTERVENTION N/A 07/10/2020   Procedure: CORONARY STENT INTERVENTION;  Surgeon: Martinique, Peter M, MD;  Location: [facility];  Service: Cardiovascular;  Laterality: N/A;   DENTAL SURGERY     LARYNGOPLASTY  08/07/2012   Procedure: LARYNGOPLASTY;  Surgeon: Izora Gala, MD;  Location: [facility];  Service: ENT;  Laterality: Left;  Left Vocal Cord Medialyzation   LEFT HEART CATH AND CORONARY ANGIOGRAPHY N/A 07/10/2020   Procedure: LEFT HEART CATH AND CORONARY ANGIOGRAPHY;  Surgeon: Martinique, Peter M, MD;  Location: [facility];  Service: Cardiovascular;  Laterality: N/A;   left knee surgury     x 2    stent  2001, 2004   coronary stents   Social History   Socioeconomic History   Marital status: Legally Separated    Spouse name: Not on file   Number of  children: 3   Years of education: college   Highest education level: Not on file  Occupational History   Occupation: disabled    Employer: [employer]  Tobacco Use   Smoking status: Former    Packs/day: 2.50    Years: 9.00    Total pack years: 22.50    Types: Cigarettes    Quit date: 02/24/1983    Years since quitting: 39.5   Smokeless tobacco: Never  Vaping Use   Vaping Use: Never used  Substance and Sexual Activity   Alcohol use: No    Alcohol/week: 0.0 standard drinks of alcohol   Drug use: No   Sexual activity: Never  Other Topics Concern   Not on file  Social History Narrative      Social Determinants of Health   Financial Resource Strain: Low Risk  (03/05/2022)   Overall Financial Resource Strain (CARDIA)    Difficulty of Paying Living Expenses: Not hard at all  Food Insecurity: No Food Insecurity (03/05/2022)   Hunger Vital Sign    Worried About Running Out of Food in the Last Year: Never true    Ran Out of Food in the Last Year: Never true  Transportation Needs: No Transportation Needs (03/05/2022)   PRAPARE - Hydrologist (Medical): No    Lack of Transportation (Non-Medical): No  Physical Activity: Sufficiently Active (03/05/2022)   Exercise Vital Sign    Days of Exercise per Week: 3 days    Minutes of Exercise per Session: 60 min  Stress: No Stress Concern Present (03/05/2022)   [place]    Feeling of Stress : Not at all  Social Connections: Socially Isolated (03/05/2022)   Social Connection and Isolation Panel [NHANES]    Frequency of Communication with Friends and Family: Three times a week    Frequency of Social Gatherings with Friends and Family: Three times a week    Attends Religious Services: Never    Active Member of Clubs or Organizations: No    Attends Archivist Meetings: Never    Marital Status: Separated  Intimate Partner Violence: Not At Risk  (03/05/2022)   Humiliation, Afraid, Rape, and Kick questionnaire    Fear of Current or Ex-Partner: No    Emotionally Abused: No    Physically Abused: No    Sexually Abused: No   Current Outpatient Medications on File Prior to Visit  Medication Sig Dispense Refill   amLODipine (NORVASC) 5 MG tablet Take 5 mg by mouth daily.  aspirin EC 81 MG tablet Take 1 tablet (81 mg total) by mouth daily. Swallow whole. 30 tablet 11   atorvastatin (LIPITOR) 40 MG tablet TAKE 1 TABLET EVERY DAY 90 tablet 3   benzonatate (TESSALON) 200 MG capsule Take 1 capsule (200 mg total) by mouth 2 (two) times daily as needed for cough. 20 capsule 0   Blood Pressure Monitoring (BLOOD PRESSURE CUFF) MISC 1 Units by Does not apply route daily. 1 each 0   calcium carbonate (TUMS - DOSED IN MG ELEMENTAL CALCIUM) 500 MG chewable tablet Chew 1 tablet by mouth daily as needed for indigestion or heartburn.     carvedilol (COREG) 12.5 MG tablet TAKE 1 AND 1/2 TABLETS TWICE DAILY 270 tablet 2   chlorthalidone (HYGROTON) 25 MG tablet TAKE 1 TABLET EVERY DAY 90 tablet 2   clopidogrel (PLAVIX) 75 MG tablet Take 1 tablet (75 mg total) by mouth daily. Need appointment for further refills 30 tablet 0   Continuous Blood Gluc Sensor (FREESTYLE LIBRE 2 SENSOR) MISC 1 Device by Does not apply route every 14 (fourteen) days. 6 each 3   doxycycline (VIBRAMYCIN) 100 MG capsule Take 1 capsule (100 mg total) by mouth 2 (two) times daily. 20 capsule 0   furosemide (LASIX) 40 MG tablet TAKE 1 TABLET (40 MG TOTAL) BY MOUTH DAILY. NEED APPT. 90 tablet 3   glucose blood (ACCU-CHEK AVIVA PLUS) test strip uad tid 100 each 12   insulin aspart protamine- aspart (NOVOLOG MIX 70/30) (70-30) 100 UNIT/ML injection Exp: 09/21/22 Lot: [id number] 10 mL 0   Insulin Lispro Prot & Lispro (HUMALOG MIX 50/50 KWIKPEN) (50-50) 100 UNIT/ML Kwikpen Inject 24 Units into the skin daily with breakfast. 15 mL 1   Insulin Pen Needle 32G X 4 MM MISC Use 1x a day 100 each 3    losartan (COZAAR) 100 MG tablet TAKE 1 TABLET EVERY DAY 90 tablet 3   Microlet Lancets MISC UAD to check sugars TID.  E11.22 270 each 3   nitroGLYCERIN (NITROSTAT) 0.4 MG SL tablet Place 1 tablet (0.4 mg total) under the tongue every 5 (five) minutes as needed for chest pain (up to 3 doses). 25 tablet 3   senna-docusate (SENOKOT-S) 8.6-50 MG per tablet Take 2 tablets by mouth 2 (two) times daily. For constipation.     No current facility-administered medications on file prior to visit.   Allergies  Allergen Reactions   Penicillins Anaphylaxis   Pneumococcal Vaccines Anaphylaxis   Shellfish Allergy Anaphylaxis   Influenza Vaccines    Lisinopril Cough   Sildenafil Other (See Comments)    Other reaction(s): Headache, Dizziness   Topiramate Other (See Comments)    Chest spasms and numbness   Zofran [Ondansetron]    Amlodipine Other (See Comments)    LE edema   Levitra [Vardenafil] Other (See Comments)    headaches   Metformin Nausea And Vomiting   Tadalafil Other (See Comments)    dizziness   Family History  Problem Relation Age of Onset   Cancer Mother     PE: There were no vitals taken for this visit. Wt Readings from Last 3 Encounters:  08/02/22 212 lb (96.2 kg)  06/09/22 210 lb (95.3 kg)  05/04/22 209 lb (94.8 kg)   Constitutional: overweight, in NAD Eyes: PERRLA, EOMI, no exophthalmos ENT: moist mucous membranes, no thyromegaly, no cervical lymphadenopathy Cardiovascular: RRR, No MRG Respiratory: CTA B Musculoskeletal: no deformities, strength intact in all 4 Skin: moist, warm, no rashes Neurological: + tremor with  outstretched hands, DTR normal in all 4  ASSESSMENT: 1. DM2, insulin-dependent, uncontrolled, with complications - CAD - s/p BMS in 2001, and DES in 2005 - stroke - CKD - DR - ED - PN - Hypoglycemia-last in 2020  2. HL  PLAN:  1. Patient with long-standing, insulin-dependent diabetes, on injectable antidiabetic regimen, with fair control.   Latest HbA1c was 6.9%, higher, 3 months ago.  At today's visit, HbA1c 7.4%, higher than before. CGM interpretation: -At today's visit, we reviewed his CGM downloads: It appears that *** of values are in target range (goal >70%), while *** are higher than 180 (goal <25%), and *** are lower than 70 (goal <4%).  The calculated average blood sugar is ***.  The projected HbA1c for the next 3 months (GMI) is ***. -Reviewing the CGM trends, ***  -Reviewing the CGM trends, sugars are very well controlled overnight, with post brunch increase in blood sugars, after which the sugars again decrease, followed by another increase in blood sugars after dinner.  Upon questioning, patient is taking his 50/50 Humalog after he eats brunch.  We discussed that this insulin needs to be taken 15 minutes before the meal.  Moving it 15 minutes before brunch, may alleviate the hyperglycemia after this meal.  Also, it may eliminate the more fluctuating blood sugars after the meal and hopefully also the increase in blood sugars after dinner.  Since his sugars are at goal overnight, I am afraid that adding another dose of Humalog 50/50 before dinner may drop his blood sugars too low.  We did discuss that this is usually twice a day insulin, but for now, we can continue it just once a day, while in the future, he may need extra dinner coverage. -He mentions that he has been on this insulin for 10 years and would prefer to continue on it.  Per review of the chart, this was approved by his insurance this morning.  He was previously obtaining it from the patient assistance program. -Regarding Trulicity, he mentions that he did not like how he felt on it so this was stopped 3 months ago.  For now, we will continue without it but it would have been excellent to be able to continue.  He may be amenable to try another GLP-1 receptor agonist in the near future. 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  He has  no problems with using pens.  - I suggested to:  Patient Instructions  Please use the following regimen: - Humalog 50/50 22 units 15 min before brunch  Please return in 3-4 months.  - we checked his HbA1c: 7%  - advised to check sugars at different times of the day - 4x a day, rotating check times - advised for yearly eye exams >> he is UTD - return to clinic in 3-4 months  2. HL - Reviewed latest lipid panel from 04/2021: Fractions at goal - Continues Lipitor 40 mg daily without side effects. -He has another visit at the [place] next month.  He will have labs at that time.  Philemon Kingdom, MD PhD [facility] Endocrinology

## 2022-08-17 ENCOUNTER — Ambulatory Visit (INDEPENDENT_AMBULATORY_CARE_PROVIDER_SITE_OTHER): Payer: Medicare HMO | Admitting: Internal Medicine

## 2022-08-17 ENCOUNTER — Encounter: Payer: Self-pay | Admitting: Internal Medicine

## 2022-08-17 VITALS — BP 120/62 | HR 62 | Ht 71.0 in | Wt 210.8 lb

## 2022-08-17 DIAGNOSIS — E7849 Other hyperlipidemia: Secondary | ICD-10-CM

## 2022-08-17 DIAGNOSIS — E1159 Type 2 diabetes mellitus with other circulatory complications: Secondary | ICD-10-CM

## 2022-08-17 DIAGNOSIS — E1165 Type 2 diabetes mellitus with hyperglycemia: Secondary | ICD-10-CM

## 2022-08-17 LAB — POCT GLYCOSYLATED HEMOGLOBIN (HGB A1C): Hemoglobin A1C: 8 % — AB (ref 4.0–5.6)

## 2022-08-17 MED ORDER — DAPAGLIFLOZIN PROPANEDIOL 10 MG PO TABS
10.0000 mg | ORAL_TABLET | Freq: Every day | ORAL | 3 refills | Status: DC
Start: 2022-08-17 — End: 2022-08-26

## 2022-08-17 NOTE — Patient Instructions (Addendum)
Please use: - Humalog 50/50 22-24 units 15 min before brunch  Start: - Farxiga 10 mg before b'fast  Please return in 3-4 months.

## 2022-08-25 ENCOUNTER — Telehealth: Payer: Self-pay

## 2022-08-25 NOTE — Telephone Encounter (Signed)
Patient states that [name] isnt covered and would like to know if theres and alternative that he can be prescribed instead

## 2022-08-26 ENCOUNTER — Other Ambulatory Visit: Payer: Self-pay | Admitting: Internal Medicine

## 2022-08-26 MED ORDER — EMPAGLIFLOZIN 25 MG PO TABS: 25.0000 mg | ORAL_TABLET | Freq: Every day | ORAL | 3 refills | Status: DC

## 2022-08-26 NOTE — Telephone Encounter (Signed)
Bishop sent Jardiance.  We may need to go through patient assistance if this is not covered either.

## 2022-08-27 NOTE — Telephone Encounter (Signed)
Patient states that [name] is in the same price range as Wilder Glade and would like to apply for Patient Assistance. He says that he will come to the office on Monday to pick up the application.

## 2022-08-30 NOTE — Telephone Encounter (Signed)
Patient came into office and completed AZ&Me paperwork.  Placed in providers box at the front.

## 2022-09-10 ENCOUNTER — Telehealth: Payer: Self-pay | Admitting: Cardiovascular Disease

## 2022-09-10 MED ORDER — CHLORTHALIDONE 25 MG PO TABS: 25.0000 mg | ORAL_TABLET | Freq: Every day | ORAL | 0 refills | Status: DC

## 2022-09-10 NOTE — Telephone Encounter (Signed)
*  STAT* If patient is at the pharmacy, call can be transferred to refill team.   1. Which medications need to be refilled? (please list name of each medication and dose if known) chlorthalidone (HYGROTON) 25 MG tablet  2. Which pharmacy/location (including street and city if local pharmacy) is medication to be sent to? [place], [place].  3. Do they need a 30 day or 90 day supply? 30 day  Patient is out of medication

## 2022-09-16 ENCOUNTER — Other Ambulatory Visit: Payer: Self-pay | Admitting: Internal Medicine

## 2022-09-16 DIAGNOSIS — E1165 Type 2 diabetes mellitus with hyperglycemia: Secondary | ICD-10-CM

## 2022-10-01 ENCOUNTER — Other Ambulatory Visit (HOSPITAL_BASED_OUTPATIENT_CLINIC_OR_DEPARTMENT_OTHER): Payer: Self-pay | Admitting: Cardiovascular Disease

## 2022-10-01 NOTE — Telephone Encounter (Signed)
Rx(s) sent to pharmacy electronically.  

## 2022-10-07 ENCOUNTER — Encounter: Payer: Self-pay | Admitting: Dietician

## 2022-10-07 ENCOUNTER — Encounter: Payer: Medicare HMO | Attending: Endocrinology | Admitting: Dietician

## 2022-10-07 VITALS — Wt 216.0 lb

## 2022-10-07 DIAGNOSIS — E118 Type 2 diabetes mellitus with unspecified complications: Secondary | ICD-10-CM

## 2022-10-07 NOTE — Progress Notes (Signed)
Diabetes Self-Management Education  Visit Type:  Follow-up  Appt. Start Time: 1120 Appt. End Time: 1210  10/07/2022  [name], identified by name and date of birth, is a 71 y.o. male with a diagnosis of Diabetes:  .    ASSESSMENT Patient is here today alone.  He was last seen by myself on 08/02/2022.  He states that he was not able to complete the goals from last visit because he misplaced the paper and had 4 teeth removed.  He is having implants in January. He states that today he is in more pain. Stopped going to the [facility] and plans to return after oral surgery in January.  He states that he does not want to start a habit just to break it soon. He is now using an app [name]) that scores any food and he is trying to eat smaller amounts of sweets.  History includes Type 2 Diabetes, HTN, HLD, CVA (2013) neuropathy, CKD Medications include:  Humalog Mix 50/50 - 22 units once daily, lasix (prn), (stopped trulicity and stated it did not work) Labs noted to include: A1C 8% 08/17/2022 increased from 7.4% 03/2022, 6.9% 01/04/2022, GFR 33, BUN 32, Creatinine 2.09, Potassium 3.8 on  12/08/2021 CGM:  FreeStyle Libre 2 - Started today - using phone app - showed him overpatches that he can purchase online   Estimated protein needs:  75 grams   Weight hx: 216 lbs 10/07/2022 212 lbs 08/02/2022 209 lbs [id number] 209 lbs 01/04/2022 214 lbs 12/08/2021 Lost weight since he started to exercise.  [employer] 3 times weekly for cardio exercise, weights, stretches for 1.5 hours.   Patient lives with his daughter and 2 grandchildren.  His daughter is having another grandchild.  They share shopping and cooking. "But mostly order out" He is retired from [employer].  He moved here in 2003. Walks with a cane.  Concerned about increased shaking. Has gone to the [facility] in the past and worked with a Clinical research associate.  Hesitant to return now as he does not like to start a habit and not be  consistent.  Discussed that it is most important to start the habit even if it gets interrupted and that there are still benefits for as often is he is able to do this. Avoids red meat.  Allergic to shellfish.  Avoids added salt.  Daughter cooks with very little salt.  Stopped eating red meat. Wakes late 11 am frequently  Weight 216 lb (98 kg). Body mass index is 30.13 kg/m.    Diabetes Self-Management Education - 10/07/22 1500       Pre-Education Assessment   Patient understands the diabetes disease and treatment process. Comprehends key points    Patient understands incorporating nutritional management into lifestyle. Needs Review    Patient undertands incorporating physical activity into lifestyle. Demonstrates understanding / competency    Patient understands using medications safely. Demonstrates understanding / competency    Patient understands monitoring blood glucose, interpreting and using results Comprehends key points    Patient understands prevention, detection, and treatment of acute complications. Demonstrates understanding / competency    Patient understands prevention, detection, and treatment of chronic complications. Demonstrates understanding / competency    Patient understands how to develop strategies to address psychosocial issues. Comprehends key points    Patient understands how to develop strategies to promote health/change behavior. Comprehends key points      Complications   Last HgB A1C per patient/outside source 8 %  How often do you check your blood sugar? > 4 times/day    Fasting Blood glucose range (mg/dL) 70-129;130-179    Postprandial Blood glucose range (mg/dL) 70-129;130-179    Number of hypoglycemic episodes per month 1      Dietary Intake   Breakfast veggie omelet, dry toast, jelly    Lunch [place] ([place] burrito, rice, beans, chips)    Dinner home cooked:  chicken, fried rice, celery, carrots    Snack (evening) cinnamon bunw     Beverage(s) water, sweet tea, occasional coffee      Activity / Exercise   Activity / Exercise Type Light (walking / raking leaves)      Patient Education   Previous Diabetes Education Yes (please comment)   07/2022   Healthy Eating Meal options for control of blood glucose level and chronic complications.;Food label reading, portion sizes and measuring food.    Medications Reviewed medication adjustment guidelines for hyperglycemia and sick days.    Monitoring Taught/evaluated CGM (comment)    Acute complications Taught prevention, symptoms, and  treatment of hypoglycemia - the 15 rule.    Diabetes Stress and Support Worked with patient to identify barriers to care and solutions;Identified and addressed patients feelings and concerns about diabetes      Individualized Goals (developed by patient)   Nutrition General guidelines for healthy choices and portions discussed    Physical Activity Exercise 3-5 times per week;30 minutes per day    Medications take my medication as prescribed    Monitoring  Consistenly use CGM    Reducing Risk examine blood glucose patterns;treat hypoglycemia with 15 grams of carbs if blood glucose less than '70mg'$ /dL      Patient Self-Evaluation of Goals - Patient rates self as meeting previously set goals (% of time)   Nutrition 50 - 75 % (half of the time)    Physical Activity < 25% (hardly ever/never)    Medications >75% (most of the time)    Monitoring 50 - 75 % (half of the time)    Problem Solving and behavior change strategies  25 - 50% (sometimes)    Reducing Risk (treating acute and chronic complications) 25 - 71% (sometimes)    Health Coping 50 - 75 % (half of the time)      Post-Education Assessment   Patient understands the diabetes disease and treatment process. Demonstrates understanding / competency    Patient understands incorporating nutritional management into lifestyle. Needs Review    Patient undertands incorporating physical activity into  lifestyle. Demonstrates understanding / competency    Patient understands using medications safely. Demonstrates understanding / competency    Patient understands monitoring blood glucose, interpreting and using results Demonstrates understanding / competency    Patient understands prevention, detection, and treatment of acute complications. Demonstrates understanding / competency    Patient understands prevention, detection, and treatment of chronic complications. Demonstrates understanding / competency    Patient understands how to develop strategies to address psychosocial issues. Demonstrates understanding / competency    Patient understands how to develop strategies to promote health/change behavior. Comprehends key points      Outcomes   Program Status Not Completed      Subsequent Visit   Since your last visit have you experienced any weight changes? Gain    Weight Gain (lbs) 4             Learning Objective:  Patient will have a greater understanding of diabetes self-management. Patient education plan is to attend individual  and/or group sessions per assessed needs and concerns.   Plan:   Patient Instructions  Great job on reducing sweets! Scan your Elenor Legato consistently: When you wake Before each meal and possibly 2 hours after Before bed   Consider starting back at the [facility] or uses your home exercise equipment- "Don't wait for tomorrow to start habits."  Bike, stretching, weight machines Helps lower your blood glucose and maintain your strength   Consider taking 600 mg ALA (Alpha Lipoc Acid) twice per day    Rethink what you drink:             Drink more water and limit sweet tea (read the label/scan in the carrot app)             Avoid creamer and return to almond milk   Recommend cooking dinner 3 times per week. Find ways to add more vegetables every day.    Expected Outcomes:  Demonstrated interest in learning. Expect positive outcomes  Education material  provided: Food label handouts  If problems or questions, patient to contact team via:  Phone  Future DSME appointment: - 2 months

## 2022-10-07 NOTE — Patient Instructions (Addendum)
Great job on reducing sweets! Scan your Elenor Legato consistently: When you wake Before each meal and possibly 2 hours after Before bed   Consider starting back at the [facility] or uses your home exercise equipment- "Don't wait for tomorrow to start habits."  Bike, stretching, weight machines Helps lower your blood glucose and maintain your strength   Consider taking 600 mg ALA (Alpha Lipoc Acid) twice per day    Rethink what you drink:             Drink more water and limit sweet tea (read the label/scan in the carrot app)             Avoid creamer and return to almond milk   Recommend cooking dinner 3 times per week. Find ways to add more vegetables every day.

## 2022-10-13 ENCOUNTER — Ambulatory Visit (INDEPENDENT_AMBULATORY_CARE_PROVIDER_SITE_OTHER): Payer: Medicare HMO | Admitting: Podiatry

## 2022-10-13 ENCOUNTER — Encounter: Payer: Self-pay | Admitting: Podiatry

## 2022-10-13 DIAGNOSIS — M792 Neuralgia and neuritis, unspecified: Secondary | ICD-10-CM | POA: Diagnosis not present

## 2022-10-13 DIAGNOSIS — B351 Tinea unguium: Secondary | ICD-10-CM

## 2022-10-13 DIAGNOSIS — E1142 Type 2 diabetes mellitus with diabetic polyneuropathy: Secondary | ICD-10-CM

## 2022-10-13 DIAGNOSIS — M79674 Pain in right toe(s): Secondary | ICD-10-CM

## 2022-10-13 DIAGNOSIS — M79675 Pain in left toe(s): Secondary | ICD-10-CM

## 2022-10-13 NOTE — Progress Notes (Signed)
This patient returns to my office for at risk foot care.  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  Patient is taking plavix.  Patient has history of CVA.  This patient is unable to cut nails himself since the patient cannot reach his nails.These nails are painful walking and wearing shoes. This patient presents for at risk foot care today.  General Appearance  Alert, conversant and in no acute stress.  Vascular  Dorsalis pedis and posterior tibial  pulses are weakly  palpable  bilaterally.  Capillary return is within normal limits  Bilaterally.Cold feet left.  .  Absent digital hair  B/L.  Neurologic  Senn-Weinstein monofilament wire test diminished  bilaterally. Muscle power within normal limits bilaterally.  Nails Thick disfigured discolored nails with subungual debris  from hallux to fifth toes bilaterally. No evidence of bacterial infection or drainage bilaterally.  Orthopedic  No limitations of motion  feet .  No crepitus or effusions noted.  No bony pathology or digital deformities noted.  Skin  normotropic skin with no porokeratosis noted bilaterally.  No signs of infections or ulcers noted.  Midfoot DJD left..  Hammer toes  B/L.   Onychomycosis  Pain in right toes  Pain in left toes  Neuropathic pain.  Consent was obtained for treatment procedures.   Mechanical debridement of nails 1-5  bilaterally performed with a nail nipper.  Filed with dremel without incident.  Dispense powerstep insole to help with heel pain and his balance.  He says the insoles are helping but he does experience locking left midfoot. During sleep.   Return office visit   3 months                   Told patient to return for periodic foot care and evaluation due to potential at risk complications.   Gardiner Barefoot DPM

## 2022-10-15 ENCOUNTER — Other Ambulatory Visit: Payer: Self-pay | Admitting: Cardiovascular Disease

## 2022-10-18 ENCOUNTER — Other Ambulatory Visit: Payer: Self-pay | Admitting: Cardiovascular Disease

## 2022-10-18 NOTE — Telephone Encounter (Signed)
Rx(s) sent to pharmacy electronically.  

## 2022-10-19 ENCOUNTER — Telehealth (HOSPITAL_BASED_OUTPATIENT_CLINIC_OR_DEPARTMENT_OTHER): Payer: Self-pay

## 2022-10-19 MED ORDER — CARVEDILOL 12.5 MG PO TABS: ORAL_TABLET | ORAL | 0 refills | Status: DC

## 2022-10-19 NOTE — Telephone Encounter (Signed)
Received fax from [facility] requesting refills for Carvedilol. Rx request sent to pharmacy.

## 2022-11-13 ENCOUNTER — Other Ambulatory Visit (HOSPITAL_BASED_OUTPATIENT_CLINIC_OR_DEPARTMENT_OTHER): Payer: Self-pay | Admitting: Cardiovascular Disease

## 2022-11-16 NOTE — Telephone Encounter (Signed)
Rx(s) sent to pharmacy electronically.  

## 2022-12-15 ENCOUNTER — Encounter (INDEPENDENT_AMBULATORY_CARE_PROVIDER_SITE_OTHER): Payer: Medicare HMO | Admitting: Ophthalmology

## 2022-12-15 DIAGNOSIS — I10 Essential (primary) hypertension: Secondary | ICD-10-CM | POA: Diagnosis not present

## 2022-12-15 DIAGNOSIS — H35373 Puckering of macula, bilateral: Secondary | ICD-10-CM | POA: Diagnosis not present

## 2022-12-15 DIAGNOSIS — E113293 Type 2 diabetes mellitus with mild nonproliferative diabetic retinopathy without macular edema, bilateral: Secondary | ICD-10-CM

## 2022-12-15 DIAGNOSIS — H43813 Vitreous degeneration, bilateral: Secondary | ICD-10-CM

## 2022-12-15 DIAGNOSIS — H35033 Hypertensive retinopathy, bilateral: Secondary | ICD-10-CM

## 2022-12-20 ENCOUNTER — Encounter (HOSPITAL_BASED_OUTPATIENT_CLINIC_OR_DEPARTMENT_OTHER): Payer: Self-pay

## 2022-12-21 ENCOUNTER — Ambulatory Visit: Payer: Medicare HMO | Admitting: Dietician

## 2022-12-21 ENCOUNTER — Encounter: Payer: Self-pay | Admitting: Internal Medicine

## 2022-12-21 ENCOUNTER — Ambulatory Visit (INDEPENDENT_AMBULATORY_CARE_PROVIDER_SITE_OTHER): Payer: Medicare HMO | Admitting: Internal Medicine

## 2022-12-21 VITALS — BP 120/68 | HR 85 | Ht 71.0 in | Wt 212.0 lb

## 2022-12-21 DIAGNOSIS — E1165 Type 2 diabetes mellitus with hyperglycemia: Secondary | ICD-10-CM | POA: Diagnosis not present

## 2022-12-21 DIAGNOSIS — E7849 Other hyperlipidemia: Secondary | ICD-10-CM | POA: Diagnosis not present

## 2022-12-21 DIAGNOSIS — E1159 Type 2 diabetes mellitus with other circulatory complications: Secondary | ICD-10-CM

## 2022-12-21 LAB — POCT GLYCOSYLATED HEMOGLOBIN (HGB A1C): Hemoglobin A1C: 8.4 % — AB (ref 4.0–5.6)

## 2022-12-21 NOTE — Progress Notes (Signed)
Patient ID: [name], male   DOB: 01-Aug-1951, 72 y.o.   MRN: [id number]  HPI: [name] is a 72 y.o.-year-old male, returning for follow-up for DM2, dx in 2007, insulin-dependent since 2011, uncontrolled, with complications (CAD, stroke, CKD, DR, ED, PN). Pt. previously saw Dr. Loanne Drilling, last visit with me 4 months ago.  Interim history: No increased urination, but has blurry vision, no nausea, chest pain. He started to et sweets again over the Holidays >> continues >> sugars increased.  Reviewed HbA1c: Lab Results  Component Value Date   HGBA1C 8.0 (A) 08/17/2022   HGBA1C 7.4 (A) 04/16/2022   HGBA1C 6.9 (A) 01/04/2022   HGBA1C 6.8 (A) 11/03/2021   HGBA1C 6.6 (A) 08/18/2021   HGBA1C 6.7 (A) 05/11/2021   HGBA1C 6.6 (A) 02/05/2021   HGBA1C 7.4 (A) 10/07/2020   HGBA1C 7.1 (A) 08/08/2020   HGBA1C 7.5 (A) 06/13/2020  04/22/2022: HbA1c 7.5%  At last OV, he was on: - Humalog 50/50 (prev. From PAP, now covered by the new insurance w/o a PA) 22 units after brunch >> moved to 15 minutes before brunch -  -stopped  in 12/2021 because feeling "weird, sweaty" He has significant tremors and cannot draw insulin from vials.  I suggested to change to: - Humalog 50/50 24 units 15 min before brunch He tried  [place] 10 mg before b'fast >> too $$$, did not qualify for PAP  Pt checks his sugars >4x a day and they are:  Prev.:  Previously:   Lowest sugar was 54 >> 70s >> 58; he has hypoglycemia awareness at 70.  Highest sugar was 264 >> 280s >> 290.  Glucometer: Accu-Chek  She usually has 2 meals: Brunch and dinner.  - + CKD, last BUN/creatinine:      CREATININE,PLASMA (in mg/dL) 2.080  H 0.6-1.4        UREA NITROGEN 30  H 5-23        GLUCOSE 143  H 74-118        SODIUM 138    134.0-146.0        POTASSIUM 4.4    3.4-5.0        CHLORIDE 108    99-109        CO2 29    22.0-32.0        CALCIUM 9.1    8.5-10.5        eGFR_CKD 33  L See_Comment    Apr 22, 2022 02:19 PM  [place]            MICROALB/CREAT RATIO RESULT 33.7    See_Comment    Apr 22, 2022 02:19 PM [place] [facility] A1C    Lab Results  Component Value Date   BUN 34 (H) 12/08/2021   BUN 41 (H) 09/10/2020   CREATININE 2.09 (H) 12/08/2021   CREATININE 2.29 (H) 09/10/2020  On Cozaar 100 mg daily.  - + HL; last set of lipids:  CHOLESTEROL 118   50-200     TRIGLYCERIDES 184    30-200      HDL CHOLESTEROL 62    29.0-71.0      CALCULATED LDL-CHOLESTEROL 19  L 60-130    Apr 22, 2022 02:19 PM HEPATIC FUNCTION    CHOLESTEROL  106  50 - 200 04/24/2021  TRIGLYCERIDE  107  30 - 200 04/24/2021  CHOLESTEROL IN HDL  60  29.0 - 71.0 04/24/2021  CHOLESTEROL IN LDL  25  60 - 130 04/24/2021  Previously: Lab Results  Component Value Date   CHOL 112 04/15/2020   HDL 44.60 04/15/2020   LDLCALC 40 04/15/2020   LDLDIRECT 63.0 01/12/2016   TRIG 136.0 04/15/2020   CHOLHDL 3 04/15/2020  On Lipitor 40 mg daily.  - last eye exam was in 11/2022 (Dr. Zigmund Daniel): + DR reportedly. + Cataracts.   On ASA 81.  - no numbness and tingling in his feet.  Last foot exam 09/2022 by Dr. Prudence Davidson.  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/29/2016   Chronic kidney disease    Constipation    takes Carafate four times day   DIAB W/UNSPEC COMP TYPE II/UNSPEC TYPE UNCNTRL    ERECTILE DYSFUNCTION    GERD    HYPERLIPIDEMIA-MIXED    HYPERTENSION, BENIGN    LBBB (left bundle branch block) 02/02/2016   MORTON'S NEUROMA, RIGHT    OSA on CPAP 09/09/2020   Peripheral neuropathy    Seasonal allergies    takes Allegra and Benadryl daily prn;uses Flonase daily   SHOULDER PAIN, RIGHT    Snoring 09/09/2020    Stroke, 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   L HP + hemiparesis, s/p CIR    TIA on medication 02/2012   Unstable angina ([place]) 07/09/2020   Past Surgical History:  Procedure Laterality Date   ANGIOPLASTY     COLONOSCOPY     CORONARY ANGIOPLASTY  2005   2 stents   CORONARY STENT INTERVENTION N/A 07/10/2020   Procedure: CORONARY STENT INTERVENTION;  Surgeon: Martinique, Peter M, MD;  Location: [facility];  Service: Cardiovascular;  Laterality: N/A;   DENTAL SURGERY     LARYNGOPLASTY  08/07/2012   Procedure: LARYNGOPLASTY;  Surgeon: Izora Gala, MD;  Location: [facility];  Service: ENT;  Laterality: Left;  Left Vocal Cord Medialyzation   LEFT HEART CATH AND CORONARY ANGIOGRAPHY N/A 07/10/2020   Procedure: LEFT HEART CATH AND CORONARY ANGIOGRAPHY;  Surgeon: Martinique, Peter M, MD;  Location: [facility];  Service: Cardiovascular;  Laterality: N/A;   left knee surgury     x 2    stent  2001, 2004   coronary stents   Social History   Socioeconomic History   Marital status: Legally Separated    Spouse name: Not on file   Number of children: 3   Years of education: college   Highest education level: Not on file  Occupational History   Occupation: disabled    Employer: [employer]  Tobacco Use   Smoking status: Former    Packs/day: 2.50    Years: 9.00    Total pack years: 22.50    Types: Cigarettes    Quit date: 02/24/1983    Years since quitting: 39.8   Smokeless tobacco: Never  Vaping Use   Vaping Use: Never used  Substance and Sexual Activity   Alcohol use: No    Alcohol/week: 0.0 standard drinks of alcohol   Drug use: No   Sexual activity: Never  Other Topics Concern   Not on file  Social History Narrative      Social Determinants of Health   Financial Resource Strain:  Low Risk  (03/05/2022)   Overall Financial Resource Strain (CARDIA)    Difficulty of Paying Living Expenses: Not hard at all  Food Insecurity: No Food Insecurity (03/05/2022)   Hunger Vital Sign    Worried  About Running Out of Food in the Last Year: Never true    Ran Out of Food in the Last Year: Never true  Transportation Needs: No Transportation Needs (03/05/2022)   PRAPARE - Hydrologist (Medical): No    Lack of Transportation (Non-Medical): No  Physical Activity: Sufficiently Active (03/05/2022)   Exercise Vital Sign    Days of Exercise per Week: 3 days    Minutes of Exercise per Session: 60 min  Stress: No Stress Concern Present (03/05/2022)   [place]    Feeling of Stress : Not at all  Social Connections: Socially Isolated (03/05/2022)   Social Connection and Isolation Panel [NHANES]    Frequency of Communication with Friends and Family: Three times a week    Frequency of Social Gatherings with Friends and Family: Three times a week    Attends Religious Services: Never    Active Member of Clubs or Organizations: No    Attends Archivist Meetings: Never    Marital Status: Separated  Intimate Partner Violence: Not At Risk (03/05/2022)   Humiliation, Afraid, Rape, and Kick questionnaire    Fear of Current or Ex-Partner: No    Emotionally Abused: No    Physically Abused: No    Sexually Abused: No   Current Outpatient Medications on File Prior to Visit  Medication Sig Dispense Refill   amLODipine (NORVASC) 5 MG tablet Take 5 mg by mouth daily.     aspirin EC 81 MG tablet Take 1 tablet (81 mg total) by mouth daily. Swallow whole. 30 tablet 11   atorvastatin (LIPITOR) 40 MG tablet TAKE 1 TABLET EVERY DAY 90 tablet 0   benzonatate (TESSALON) 200 MG capsule Take 1 capsule (200 mg total) by mouth 2 (two) times daily as needed for cough. 20 capsule 0   Blood Pressure Monitoring (BLOOD PRESSURE CUFF) MISC 1 Units by Does not apply route daily. 1 each 0   calcium carbonate (TUMS - DOSED IN MG ELEMENTAL CALCIUM) 500 MG chewable tablet Chew 1 tablet by mouth daily as needed for indigestion or  heartburn.     carvedilol (COREG) 12.5 MG tablet TAKE 1 & 1/2 (ONE & ONE-HALF) TABLETS BY MOUTH TWICE DAILY. Please keep your upcoming appointment for refills. 270 tablet 0   chlorthalidone (HYGROTON) 25 MG tablet Take 1 tablet (25 mg total) by mouth daily. 90 tablet 0   clopidogrel (PLAVIX) 75 MG tablet TAKE 1 TABLET EVERY DAY NEED APPOINTMENT FOR FURTHER REFILLS 90 tablet 0   Continuous Blood Gluc Sensor (FREESTYLE LIBRE 2 SENSOR) MISC 1 Device by Does not apply route every 14 (fourteen) days. 6 each 3   doxycycline (VIBRAMYCIN) 100 MG capsule Take 1 capsule (100 mg total) by mouth 2 (two) times daily. 20 capsule 0   empagliflozin (JARDIANCE) 25 MG TABS tablet Take 1 tablet (25 mg total) by mouth daily before breakfast. 90 tablet 3   furosemide (LASIX) 40 MG tablet TAKE 1 TABLET (40 MG TOTAL) BY MOUTH DAILY. NEED APPT. 90 tablet 3   glucose blood (ACCU-CHEK AVIVA PLUS) test strip uad tid 100 each 12   Insulin Lispro Prot & Lispro (HUMALOG MIX 50/50 KWIKPEN) (50-50) 100 UNIT/ML Kwikpen Inject 24  Units into the skin daily with breakfast. (Patient not taking: Reported on 08/17/2022) 15 mL 1   Insulin Pen Needle 32G X 4 MM MISC Use 1x a day 100 each 3   losartan (COZAAR) 100 MG tablet TAKE 1 TABLET EVERY DAY 90 tablet 3   Microlet Lancets MISC UAD to check sugars TID.  E11.22 270 each 3   nitroGLYCERIN (NITROSTAT) 0.4 MG SL tablet Place 1 tablet (0.4 mg total) under the tongue every 5 (five) minutes as needed for chest pain (up to 3 doses). 25 tablet 3   senna-docusate (SENOKOT-S) 8.6-50 MG per tablet Take 2 tablets by mouth 2 (two) times daily. For constipation.     No current facility-administered medications on file prior to visit.   Allergies  Allergen Reactions   Penicillins Anaphylaxis   Pneumococcal Vaccines Anaphylaxis   Shellfish Allergy Anaphylaxis   Influenza Vaccines    Lisinopril Cough   Sildenafil Other (See Comments)    Other reaction(s): Headache, Dizziness   Topiramate Other  (See Comments)    Chest spasms and numbness   Zofran [Ondansetron]    Amlodipine Other (See Comments)    LE edema   Levitra [Vardenafil] Other (See Comments)    headaches   Metformin Nausea And Vomiting   Tadalafil Other (See Comments)    dizziness   Family History  Problem Relation Age of Onset   Cancer Mother    PE: BP 120/68 (BP Location: Right Arm, Patient Position: Sitting, Cuff Size: Normal)   Pulse 85   Ht '5\' 11"'$  (1.803 m)   Wt 212 lb (96.2 kg)   SpO2 98%   BMI 29.57 kg/m  Wt Readings from Last 3 Encounters:  12/21/22 212 lb (96.2 kg)  10/07/22 216 lb (98 kg)  08/17/22 210 lb 12.8 oz (95.6 kg)   Constitutional: overweight, in NAD Eyes: EOMI, no exophthalmos ENT: no thyromegaly, no cervical lymphadenopathy Cardiovascular: RRR, No MRG Respiratory: CTA B Musculoskeletal: no deformities Skin: no rashes Neurological: + tremor with outstretched hands, + L sided paresis  ASSESSMENT: 1. DM2, insulin-dependent, uncontrolled, with complications - CAD - s/p BMS in 2001, and DES in 2005 - stroke - CKD - DR - ED - PN - Hypoglycemia-last in 2020  2. HL  PLAN:  1. Patient with long history of type 2 diabetes, insulin-dependent, on injectable antidiabetic regimen with premixed insulin (Humalog) 50/50).  He cannot start an SGLT2 inhibitor, as suggested at last visit-this was too expensive and he did not qualify for patient assistance.  At that time, HbA1c was 8%, higher.  Sugars were improving overnight, from the 200-250 range down to 150-170 range, and they were increasing again particularly after lunch and then again after dinner.  I suggested addition of Farxiga and also occasional higher Humalog doses 15 minutes before brunch, depending on the consistency of the meal and the sugars before brunch. CGM interpretation: -At today's visit, we reviewed his CGM downloads: It appears that 78% of values are in target range (goal >70%), while 22% are higher than 180 (goal <25%),  and 0% are lower than 70 (goal <4%).  The calculated average blood sugar is 158.  The projected HbA1c for the next 3 months ([facility]MI) is ~7.2%. -Reviewing the CGM trends, sugars appear to be around target as but they start increasing after 6 AM and peaking after brunch.  He had loss of data between approximately 7 PM and 4 AM we discussed about scanning the device at least every 8 hours. We also  discussed that this regimen is not very flexible and I suggested to switch to a basal bolus insulin regimen.  However, he tells me that he has a lot of 50/50 insulin at home but he can make this change later in the year, after running out.  Sugars increase after brunch, but upon questioning, he may be taking his insulin today, after he already started eating.  We discussed about taking insulin even earlier, approximately 30 minutes before he starts eating as the sugars are on the trend up around that time.  I also suggested to add a low-dose of the insulin before dinner as sugars will be high after this meal, but more for blood sugar control overnight. - I suggested to:  Patient Instructions  Please use: - Humalog 50/50 24 units 15 min before brunch  Please return in 3-4 months.  - we checked his HbA1c: 8.4% (higher) - advised to check sugars at different times of the day - 4x a day, rotating check times - advised for yearly eye exams >> he is UTD - return to clinic in 3-4 months  2. HL - reviewed the latest lipid panel from 04/2022: All fractions at goal with exception of a slightly high triglyceride level -He continues Lipitor 40 mg daily without side effects  Philemon Kingdom, MD PhD [facility]

## 2022-12-21 NOTE — Patient Instructions (Addendum)
Please change: - Humalog 50/50 - 24 units 30 min before brunch, 8 units before dinner  Reduce the sweets and starches!  Please return in 3-4 months with your sugar log.

## 2022-12-23 ENCOUNTER — Encounter (HOSPITAL_BASED_OUTPATIENT_CLINIC_OR_DEPARTMENT_OTHER): Payer: Self-pay | Admitting: Cardiovascular Disease

## 2022-12-23 ENCOUNTER — Ambulatory Visit (INDEPENDENT_AMBULATORY_CARE_PROVIDER_SITE_OTHER): Payer: Medicare HMO | Admitting: Cardiovascular Disease

## 2022-12-23 DIAGNOSIS — G4733 Obstructive sleep apnea (adult) (pediatric): Secondary | ICD-10-CM | POA: Diagnosis not present

## 2022-12-23 DIAGNOSIS — I200 Unstable angina: Secondary | ICD-10-CM | POA: Diagnosis not present

## 2022-12-23 DIAGNOSIS — I5042 Chronic combined systolic (congestive) and diastolic (congestive) heart failure: Secondary | ICD-10-CM | POA: Diagnosis not present

## 2022-12-23 DIAGNOSIS — Z01812 Encounter for preprocedural laboratory examination: Secondary | ICD-10-CM | POA: Diagnosis not present

## 2022-12-23 MED ORDER — PREDNISONE 50 MG PO TABS: ORAL_TABLET | ORAL | 0 refills | Status: DC

## 2022-12-23 MED ORDER — SODIUM CHLORIDE 0.9% FLUSH: 3.0000 mL | Freq: Two times a day (BID) | INTRAVENOUS | Status: DC

## 2022-12-23 MED ORDER — NITROGLYCERIN 0.4 MG SL SUBL: 0.4000 mg | SUBLINGUAL_TABLET | SUBLINGUAL | 3 refills | Status: AC | PRN

## 2022-12-23 NOTE — Progress Notes (Signed)
Cardiology Office Note   Date:  12/23/2022   ID:  [name], DOB 07-13-1951, MRN [id number]  PCP:  Binnie Rail, MD  Cardiologist:  Skeet Latch, MD  Electrophysiologist:  None  Nephrologist: Dr. Joylene Grapes  Evaluation Performed:  Follow-Up Visit  Chief Complaint:  hypertension  History of Present Illness:    [name] is a 72 y.o. male with hypertension, CAD status post multiple PCI, CVA, diabetes mellitus type 2, hyperlipidemia, and prior tobacco abuse who presents for follow up.  [name] was referred by Dr. Billey Gosling on 02/09/16. [name] and PCI with a BMS to OM while living in [place].  He had in-stent restenosis in 2005 and had a DES placed.   At that time he reported occasional episodes of chest pain relieved by aspirin.  He denied exertional chest pain but did note dizziness and nausea.  Additionally, he was noted to have a left bundle branch block that was not present on his last EKG in 2013. Therefore, he was referred for exercise Myoview 02/13/16 that revealed LVEF 39% with global hypokinesis but no ischemia.  Echo 03/01/16 revealed LV 40-45% with mild LVH and grade 2 diastolic dysfunction. There was also mild hypokinesis of the mid to apical anteroseptum.   He worked with our pharmacists and had achieved very good blood pressure control.  He is in a program at the [place] that checks his BP daily.    [name] was seen in the ED 06/2020 with a complaint of two months of chest pain, abdominal pain, back pain, and gas.  High-sensitivity troponin was elevated at 32.  EKG showed sinus rhythm with a left bundle branch block, which is chronic.  He left prior to receiving a follow-up lab because of COVID-19 related back up in the ED.  He followed up in the office a couple days later and high-sensitivity troponin was 124.  He was direct admitted to the hospital where he underwent cardiac catheterization.  His cath 8/19 revealed 95% stenosis of the ostial OM 2 just proximal  to his prior OM stent.  A drug-eluting stent was successfully placed.  In follow-up he continued to be much better.  He had a repeat echo 07/2020 that revealed an improvement in his LVEF to 55% with grade 1 diastolic dysfunction.  Right atrial pressure was 3 mmHg.  He was referred for sleep study and found to have severe sleep apnea.    [name] was doing well at his last visit 07/2021.  He did note increased diaphoresis with exercise.  He had a Lexiscan Myoview that revealed LVEF 42% with anterior and apical hypokinesis.  There was also a fixed inferior and apical defect consistent with prior infarct.  There was no ischemia.  [name] reports experiencing shortness of breath with exertion, such as walking, which began a few weeks ago. He also experiences lightheadedness and mild chest pressure but denies any chest pain. He has not observed any swelling in his legs or feet.  The patient has a history of PCI and feels that his current symptoms are reminiscent of those he had before the stent was placed. He has not used nitroglycerin for these symptoms. He denies edema, orhthopnea or PND.  [name] also notes a cyst on his wrist, which has decreased in size and is not causing pain. He has no additional complaints or concerns at present.   Past Medical History:  Diagnosis Date   ALLERGIC RHINITIS    CAD, NATIVE  VESSEL    BMS to OM1 2001, DES to BMS 2005   Chronic back pain    herniated disc   Chronic combined systolic and diastolic heart failure ([place]) 06/29/2016   Chronic kidney disease    Constipation    takes Carafate four times day   DIAB W/UNSPEC COMP TYPE II/UNSPEC TYPE UNCNTRL    ERECTILE DYSFUNCTION    GERD    HYPERLIPIDEMIA-MIXED    HYPERTENSION, BENIGN    LBBB (left bundle branch block) 02/02/2016   MORTON'S NEUROMA, RIGHT    OSA on CPAP 09/09/2020   Peripheral neuropathy    Seasonal allergies    takes Allegra and Benadryl daily prn;uses Flonase daily   SHOULDER PAIN,  RIGHT    Snoring 09/09/2020   Stroke, 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   L HP + hemiparesis, s/p CIR    TIA on medication 02/2012   Unstable angina ([place]) 07/09/2020    Past Surgical History:  Procedure Laterality Date   ANGIOPLASTY     COLONOSCOPY     CORONARY ANGIOPLASTY  2005   2 stents   CORONARY STENT INTERVENTION N/A 07/10/2020   Procedure: CORONARY STENT INTERVENTION;  Surgeon: Martinique, Peter M, MD;  Location: [facility];  Service: Cardiovascular;  Laterality: N/A;   DENTAL SURGERY     LARYNGOPLASTY  08/07/2012   Procedure: LARYNGOPLASTY;  Surgeon: Izora Gala, MD;  Location: [facility];  Service: ENT;  Laterality: Left;  Left Vocal Cord Medialyzation   LEFT HEART CATH AND CORONARY ANGIOGRAPHY N/A 07/10/2020   Procedure: LEFT HEART CATH AND CORONARY ANGIOGRAPHY;  Surgeon: Martinique, Peter M, MD;  Location: [facility];  Service: Cardiovascular;  Laterality: N/A;   left knee surgury     x 2    stent  2001, 2004   coronary stents     Current Outpatient Medications  Medication Sig Dispense Refill   amLODipine (NORVASC) 5 MG tablet Take 5 mg by mouth daily.     aspirin EC 81 MG tablet Take 1 tablet (81 mg total) by mouth daily. Swallow whole. 30 tablet 11   atorvastatin (LIPITOR) 40 MG tablet TAKE 1 TABLET EVERY DAY 90 tablet 0   benzonatate (TESSALON) 200 MG capsule Take 1 capsule (200 mg total) by mouth 2 (two) times daily as needed for cough. 20 capsule 0   Blood Pressure Monitoring (BLOOD PRESSURE CUFF) MISC 1 Units by Does not apply route daily. 1 each 0   calcium carbonate (TUMS - DOSED IN MG ELEMENTAL CALCIUM) 500 MG chewable tablet Chew 1 tablet by mouth daily as needed for indigestion or heartburn.     carvedilol (COREG) 12.5 MG tablet TAKE 1 & 1/2 (ONE & ONE-HALF) TABLETS BY MOUTH TWICE DAILY. Please keep your upcoming appointment for refills. 270 tablet 0   chlorhexidine (PERIDEX) 0.12 % solution Use as directed 15 mLs in the mouth or throat 2 (two) times daily.      chlorthalidone (HYGROTON) 25 MG tablet Take 1 tablet (25 mg total) by mouth daily. 90 tablet 0   clindamycin (CLEOCIN) 150 MG capsule Take 150 mg by mouth 3 (three) times daily. For 7 days     clopidogrel (PLAVIX) 75 MG tablet TAKE 1 TABLET EVERY DAY NEED APPOINTMENT FOR FURTHER REFILLS 90 tablet 0   Continuous Blood Gluc Sensor (FREESTYLE LIBRE 2 SENSOR) MISC 1 Device by Does not apply route every 14 (fourteen) days. 6 each 3   empagliflozin (JARDIANCE) 25 MG TABS tablet Take 1 tablet (25 mg  total) by mouth daily before breakfast. 90 tablet 3   furosemide (LASIX) 40 MG tablet TAKE 1 TABLET (40 MG TOTAL) BY MOUTH DAILY. NEED APPT. 90 tablet 3   glucose blood (ACCU-CHEK AVIVA PLUS) test strip uad tid 100 each 12   Insulin Lispro Prot & Lispro (HUMALOG MIX 50/50 KWIKPEN) (50-50) 100 UNIT/ML Kwikpen Inject 24 Units into the skin daily with breakfast. 15 mL 1   Insulin Pen Needle 32G X 4 MM MISC Use 1x a day 100 each 3   losartan (COZAAR) 100 MG tablet TAKE 1 TABLET EVERY DAY 90 tablet 3   Microlet Lancets MISC UAD to check sugars TID.  E11.22 270 each 3   predniSONE (DELTASONE) 50 MG tablet Take 1 tablet 13 hours prior to test, then one 7 hours prior to test, and then PRIOR TO LEAVING HOUSE FOR PROCEDURE. TAKE LAST DOSE WITH BENADRYL 50 MG 3 tablet 0   senna-docusate (SENOKOT-S) 8.6-50 MG per tablet Take 2 tablets by mouth 2 (two) times daily. For constipation.     traMADol (ULTRAM) 50 MG tablet Take 50 mg by mouth every 4 (four) hours as needed.     nitroGLYCERIN (NITROSTAT) 0.4 MG SL tablet Place 1 tablet (0.4 mg total) under the tongue every 5 (five) minutes as needed for chest pain (up to 3 doses). 25 tablet 3   Current Facility-Administered Medications  Medication Dose Route Frequency Provider Last Rate Last Admin   sodium chloride flush (NS) 0.9 % injection 3 mL  3 mL Intravenous Q12H Skeet Latch, MD        Allergies:   Penicillins, Pneumococcal vaccines, Shellfish allergy, Influenza  vaccines, Lisinopril, Sildenafil, Topiramate, Zofran [ondansetron], Amlodipine, Levitra [vardenafil], Metformin, and Tadalafil    Social History:  The patient  reports that he quit smoking about 39 years ago. His smoking use included cigarettes. He has a 22.50 pack-year smoking history. He has never used smokeless tobacco. He reports that he does not drink alcohol and does not use drugs.   Family History:  The patient's family history includes Cancer in his mother.    ROS:   Please see the history of present illness. (+) Exertional diaphoresis All other systems are reviewed and negative.    PHYSICAL EXAM: VS:  BP 118/62 (BP Location: Right Arm, Patient Position: Sitting, Cuff Size: Large)   Pulse 63   Ht '5\' 11"'$  (1.803 m)   Wt 212 lb 4.8 oz (96.3 kg)   BMI 29.61 kg/m  , BMI Body mass index is 29.61 kg/m. GENERAL:  Well appearing HEENT: Pupils equal round and reactive, fundi not visualized, oral mucosa unremarkable NECK:  No jugular venous distention, waveform within normal limits, carotid upstroke brisk and symmetric, no bruits LUNGS:  Clear to auscultation bilaterally HEART:  RRR.  PMI not displaced or sustained,S1 and S2 within normal limits, no S3, no S4, no clicks, no rubs, no  murmurs ABD:  Flat, positive bowel sounds normal in frequency in pitch, no bruits, no rebound, no guarding, no midline pulsatile mass, no hepatomegaly, no splenomegaly EXT:  2 plus pulses throughout, no edema, no cyanosis no clubbing.  L wrist ganglion cyst. SKIN:  No rashes no nodules NEURO:  L sided weakness and foot drop.  L arm intention tremor PSYCH:  Cognitively intact, oriented to person place and time   EKG:   12/23/22: Sinus arrhythmia.  Rate 63 bpm.  LBBB. 08/03/2021: Sinus rhythm. Rate 77 bpm. Left bundle branch block. 02/03/21: Sinus rhythm.  Rate 72 bpm.  Left bundle branch block. 08/08/2020: Sinus rhythm.  Rate 74 bpm.  Left bundle branch block. 07/16/20: Sinus rhythm.  Rate 80 bpm.   LBBB. 07/09/20: Sinus rhythm.  Rate 91 bpm.  LBBB. 09/01/18: Sinus rhythm.  Rate 71 bpm.  LBBB 07/20/17: Sinus rhythm. Rate 74 bpm. Left bundle branch block. 3//13/17: sinus rhythm, rate 64 bpm.  LBBB.    Lexiscan Myoview 07/2021:   Lexiscan stress is electrically nondiagnostic due to baseline LBBB   Myoview scan shows a small fixed defect in distal inferior and apical walls consistent with scar   LVEF is 42% with distal anterior and apical hypokinesis   Overall intermediate risk scan.    Echo 07/29/2020: IMPRESSIONS    1. Left ventricular ejection fraction, by estimation, is 55%. The left  ventricle has normal function. The left ventricle has no regional wall  motion abnormalities but there was septal-lateral dyssynchrony consistent  with LBBB. Left ventricular diastolic   parameters are consistent with Grade I diastolic dysfunction (impaired  relaxation).   2. Right ventricular systolic function is normal. The right ventricular  size is normal. There is normal pulmonary artery systolic pressure. The  estimated right ventricular systolic pressure is 76.1 mmHg.   3. The aortic valve is tricuspid. Aortic valve regurgitation is not  visualized. No aortic stenosis is present.   4. The mitral valve is normal in structure. Trivial mitral valve  regurgitation. No evidence of mitral stenosis.   5. The inferior vena cava is normal in size with greater than 50%  respiratory variability, suggesting right atrial pressure of 3 mmHg.   LHC 07/10/20:  2nd Mrg-1 lesion is 95% stenosed. 2nd Mrg-2 lesion is 30% stenosed. RPDA lesion is 100% stenosed. A drug-eluting stent was successfully placed using a STENT RESOLUTE ONYX 2.75X8. Post intervention, there is a 0% residual stenosis. Post intervention, there is a 0% residual stenosis. LV end diastolic pressure is normal.   1. 2 vessel obstructive CAD    -95% ostial OM2. This was at the proximal margin of the old stents    - 100% distal PDA. This  fills by left to right collaterals. 2. Normal LVEDP 3. Successful PCI of the ostium of the OM with DES x 1.   Plan: DAPT indefinitely given multiple stents in OM. Anticipate same day DC. Will hold losartan and HCTZ until renal function repeated as outpatient.   Lexiscan Myoview 10/2018: Nuclear stress EF: 43%. The left ventricular ejection fraction is moderately decreased (30-44%). Defect 1: There is a medium defect of moderate severity present in the mid anteroseptal, mid inferoseptal, apical septal and apex location. This septal defect is likely due to the LBBB. This is an intermediate risk study. This study is similar to the previous study in 2017.  ABI 08/2018: Normal bilaterally  Echo 09/20/18: Study Conclusions   - Left ventricle: abnormal septal motion. The cavity size was    mildly dilated. Wall thickness was increased in a pattern of    severe LVH. Systolic function was mildly to moderately reduced.    The estimated ejection fraction was in the range of 40% to 45%.    Doppler parameters are consistent with abnormal left ventricular    relaxation (grade 1 diastolic dysfunction).  - Atrial septum: There was increased thickness of the septum,    consistent with lipomatous hypertrophy. No defect or patent    foramen ovale was identified.   Carotid Doppler 10/24/14: Normal study   Recent Labs: No results found for requested labs within  last 365 days.    Lipid Panel    Component Value Date/Time   CHOL 112 04/15/2020 1128   CHOL 164 07/20/2017 1202   TRIG 136.0 04/15/2020 1128   TRIG 200 04/20/2009 0000   HDL 44.60 04/15/2020 1128   HDL 55 07/20/2017 1202   CHOLHDL 3 04/15/2020 1128   VLDL 27.2 04/15/2020 1128   LDLCALC 40 04/15/2020 1128   LDLCALC 82 07/20/2017 1202   LDLDIRECT 63.0 01/12/2016 0931    Wt Readings from Last 3 Encounters:  12/23/22 212 lb 4.8 oz (96.3 kg)  12/21/22 212 lb (96.2 kg)  10/07/22 216 lb (98 kg)    11/03/2020: Sodium 143, potassium  4.2, BUN 27, creatinine 1.68  ASSESSMENT AND PLAN: Unstable angina [facility]) [name] has angina similar to prior episodes.  He has struggled with an OM which required stenting twice.  He has been on lifelong DAPT.  He also has a chronically occluded distal PDA with left-to-right collateralization.  We did stress testing a few years ago that was negative for ischemia.  At this point, I think it would be best to proceed with cardiac catheterization.  Recommended that he start using his nitroglycerin as needed for his chest pressure and shortness of breath.  He was given instructions on when to go to the emergency department.  Continue aspirin, clopidogrel, carvedilol, and amlodipine.  He will come back for fasting lipids, CMP, and precath labs.  He requires precath hydration and preparation for iodine allergy.  Chronic combined systolic and diastolic heart failure (HCC) LVEF 45%.  He is euvolemic and stable from a heart failure standpoint.  Blood pressure is well-controlled.  Renal function has previously been stable despite being on 2 diuretics.  Checking CMP as above.  Continue carvedilol, chlorthalidone, Jardiance, Lasix, and losartan.  Consider Entresto.  OSA on CPAP Continue CPAP.  Shared Decision Making/Informed Consent The risks [stroke (1 in 1000), death (1 in 1000), kidney failure [usually temporary] (1 in 500), bleeding (1 in 200), allergic reaction [possibly serious] (1 in 200)], benefits (diagnostic support and management of coronary artery disease) and alternatives of a cardiac catheterization were discussed in detail with [name] and he is willing to proceed.  Current medicines are reviewed at length with the patient today.  The patient does not have concerns regarding medicines.  The following changes have been made: No changes  Labs/ tests ordered today include:    Orders Placed This Encounter  Procedures   Lipid panel   CBC with Differential/Platelet   Comprehensive metabolic  panel   EKG 29-FAOZ    Disposition:   FU with Aiva Miskell C. Oval Linsey, MD, [facility] in 1-2 months.    Signed, Donney Caraveo C. Oval Linsey, MD, [facility]  12/23/2022 10:13 AM    [place]

## 2022-12-23 NOTE — Patient Instructions (Addendum)
Medication Instructions:  Use your NTG under your tongue for recurrent chest pain. May take one tablet every 5 minutes. If you are still having discomfort after 3 tablets in 15 minutes, call 911.  Please take Prednisone '50mg'$  by mouth at: Thirteen hours prior to cath  Seven hours prior to cath  And prior to leaving home please take last dose of Prednisone '50mg'$  and Benadryl '50mg'$  by mouth.  Labwork: FASTING LP/CMET TOMORROW MORNING  Testing/Procedures: Your physician has requested that you have a cardiac catheterization. Cardiac catheterization is used to diagnose and/or treat various heart conditions. Doctors may recommend this procedure for a number of different reasons. The most common reason is to evaluate chest pain. Chest pain can be a symptom of coronary artery disease (CAD), and cardiac catheterization can show whether plaque is narrowing or blocking your heart's arteries. This procedure is also used to evaluate the valves, as well as measure the blood flow and oxygen levels in different parts of your heart. For further information please visit [email or web address]. Please follow instruction sheet, as given.  Follow-Up: 1 MONTH WITH CAITLIN W NP   6 MONTHS WITH DR [facility]   Any Other Special Instructions Will Be Listed Below (If Applicable).  You are scheduled for a Cardiac Catheterization on Tuesday, February 6 with Dr. Peter Martinique.  1. Please arrive at the [facility] (Main Entrance A) at [facility]: [address] [place], [place] [address] at 7:00 AM (This time is two hours before your procedure to ensure your preparation). Free valet parking service is available.   Special note: Every effort is made to have your procedure done on time. Please understand that emergencies sometimes delay scheduled procedures.  2. Diet: Do not eat solid foods after midnight.  The patient may have clear liquids until 5am upon the day of the procedure.  3. Labs: You will need to have blood  drawn on Friday, February 2 at [facility] at [place]:  [address] [place], [place] [address] (lab is in the Primary Care office located on the 3rd floor).  Hours: 8:00 am - 4:30 pm (avoid 12:00 pm - 1:00 pm)  Phone: [phone].  Tell staff you are there for blood work and they will direct you to the lab.  You do not need an appointment.   4. Medication instructions in preparation for your procedure:   Contrast Allergy: Yes, Please take Prednisone '50mg'$  by mouth at: Thirteen hours prior to cath  Seven hours prior to cath  And prior to leaving home please take last dose of Prednisone '50mg'$  and Benadryl '50mg'$  by mouth.  NO LOSARTAN DAY BEFORE OR DAY OF CATH   NO FUROSEMIDE OR CHLORTHALIDONE DAY OF CATH   NO INSULIN MORNING OF CATH   On the morning of your procedure, take your Aspirin 81 mg CLOPIDOGREL and any morning medicines NOT listed above.  You may use sips of water.  5. Plan for one night stay--bring personal belongings. 6. Bring a current list of your medications and current insurance cards. 7. You MUST have a responsible person to drive you home. 8. Someone MUST be with you the first 24 hours after you arrive home or your discharge will be delayed. 9. Please wear clothes that are easy to get on and off and wear slip-on shoes.  Thank you for allowing [place] to care for you!   -- [facility]

## 2022-12-23 NOTE — Assessment & Plan Note (Signed)
LVEF 45%.  He is euvolemic and stable from a heart failure standpoint.  Blood pressure is well-controlled.  Renal function has previously been stable despite being on 2 diuretics.  Checking CMP as above.  Continue carvedilol, chlorthalidone, Jardiance, Lasix, and losartan.  Consider Entresto.

## 2022-12-23 NOTE — Assessment & Plan Note (Addendum)
[name] has angina similar to prior episodes.  He has struggled with an OM which required stenting twice.  He has been on lifelong DAPT.  He also has a chronically occluded distal PDA with left-to-right collateralization.  We did stress testing a few years ago that was negative for ischemia.  At this point, I think it would be best to proceed with cardiac catheterization.  Recommended that he start using his nitroglycerin as needed for his chest pressure and shortness of breath.  He was given instructions on when to go to the emergency department.  Continue aspirin, clopidogrel, carvedilol, and amlodipine.  He will come back for fasting lipids, CMP, and precath labs.  He requires precath hydration and preparation for iodine allergy.

## 2022-12-23 NOTE — Assessment & Plan Note (Signed)
Continue CPAP.

## 2022-12-24 LAB — CBC WITH DIFFERENTIAL/PLATELET
Basophils Absolute: 0.1 10*3/uL (ref 0.0–0.2)
Basos: 1 %
EOS (ABSOLUTE): 1.1 10*3/uL — ABNORMAL HIGH (ref 0.0–0.4)
Eos: 13 %
Hematocrit: 30.9 % — ABNORMAL LOW (ref 37.5–51.0)
Hemoglobin: 10 g/dL — ABNORMAL LOW (ref 13.0–17.7)
Immature Grans (Abs): 0 10*3/uL (ref 0.0–0.1)
Immature Granulocytes: 0 %
Lymphocytes Absolute: 2.6 10*3/uL (ref 0.7–3.1)
Lymphs: 32 %
MCH: 30.5 pg (ref 26.6–33.0)
MCHC: 32.4 g/dL (ref 31.5–35.7)
MCV: 94 fL (ref 79–97)
Monocytes Absolute: 0.9 10*3/uL (ref 0.1–0.9)
Monocytes: 11 %
Neutrophils Absolute: 3.5 10*3/uL (ref 1.4–7.0)
Neutrophils: 43 %
Platelets: 161 10*3/uL (ref 150–450)
RBC: 3.28 x10E6/uL — ABNORMAL LOW (ref 4.14–5.80)
RDW: 12.2 % (ref 11.6–15.4)
WBC: 8.2 10*3/uL (ref 3.4–10.8)

## 2022-12-24 LAB — COMPREHENSIVE METABOLIC PANEL
ALT: 15 IU/L (ref 0–44)
AST: 17 IU/L (ref 0–40)
Albumin/Globulin Ratio: 1.3 (ref 1.2–2.2)
Albumin: 4.1 g/dL (ref 3.8–4.8)
Alkaline Phosphatase: 73 IU/L (ref 44–121)
BUN/Creatinine Ratio: 16 (ref 10–24)
BUN: 34 mg/dL — ABNORMAL HIGH (ref 8–27)
Bilirubin Total: 0.8 mg/dL (ref 0.0–1.2)
CO2: 22 mmol/L (ref 20–29)
Calcium: 9.5 mg/dL (ref 8.6–10.2)
Chloride: 102 mmol/L (ref 96–106)
Creatinine, Ser: 2.16 mg/dL — ABNORMAL HIGH (ref 0.76–1.27)
Globulin, Total: 3.1 g/dL (ref 1.5–4.5)
Glucose: 195 mg/dL — ABNORMAL HIGH (ref 70–99)
Potassium: 4 mmol/L (ref 3.5–5.2)
Sodium: 138 mmol/L (ref 134–144)
Total Protein: 7.2 g/dL (ref 6.0–8.5)
eGFR: 32 mL/min/{1.73_m2} — ABNORMAL LOW (ref >59)

## 2022-12-24 LAB — LIPID PANEL
Chol/HDL Ratio: 1.9 ratio (ref 0.0–5.0)
Cholesterol, Total: 116 mg/dL (ref 100–199)
HDL: 60 mg/dL (ref >39)
LDL Chol Calc (NIH): 38 mg/dL (ref 0–99)
Triglycerides: 95 mg/dL (ref 0–149)
VLDL Cholesterol Cal: 18 mg/dL (ref 5–40)

## 2022-12-27 ENCOUNTER — Telehealth: Payer: Self-pay | Admitting: Cardiovascular Disease

## 2022-12-27 DIAGNOSIS — D649 Anemia, unspecified: Secondary | ICD-10-CM

## 2022-12-27 DIAGNOSIS — I200 Unstable angina: Secondary | ICD-10-CM

## 2022-12-27 NOTE — Telephone Encounter (Signed)
Can we just start with a Lexi given his anemia and CKD. If abnormal then we will get a cath  Repeat CBC in a week   Above received from Dr Oval Linsey after she reviewed patients recent labs  Tried to call patient on his number, unable to leave vm Called number left with phone message (wife's phone), left message to call back

## 2022-12-27 NOTE — Telephone Encounter (Signed)
Pt states he is in ER right and needs to r/s his catho

## 2022-12-27 NOTE — Telephone Encounter (Signed)
Rescheduled patient for Wednesday 2/7

## 2022-12-28 ENCOUNTER — Telehealth (HOSPITAL_BASED_OUTPATIENT_CLINIC_OR_DEPARTMENT_OTHER): Payer: Self-pay | Admitting: Cardiovascular Disease

## 2022-12-28 NOTE — Telephone Encounter (Signed)
Advised patient, verbalized understanding  

## 2022-12-28 NOTE — Telephone Encounter (Signed)
Called to discuss scheduling the [facility] ordered by Dr. Jorge Mandril answer and no voice mail kf

## 2022-12-29 ENCOUNTER — Ambulatory Visit (HOSPITAL_COMMUNITY): Admission: RE | Admit: 2022-12-29 | Payer: Medicare HMO | Source: Home / Self Care | Admitting: Cardiology

## 2022-12-29 ENCOUNTER — Encounter (HOSPITAL_COMMUNITY): Admission: RE | Payer: Self-pay | Source: Home / Self Care

## 2022-12-29 SURGERY — LEFT HEART CATH AND CORONARY ANGIOGRAPHY
Anesthesia: LOCAL

## 2022-12-29 NOTE — Telephone Encounter (Signed)
Called to speak with the patient to schedule the [facility] ordered by Dr. Jorge Mandril answer and voice mail is full

## 2022-12-31 ENCOUNTER — Encounter (HOSPITAL_COMMUNITY): Payer: Self-pay | Admitting: *Deleted

## 2022-12-31 ENCOUNTER — Telehealth (HOSPITAL_COMMUNITY): Payer: Self-pay | Admitting: *Deleted

## 2022-12-31 NOTE — Telephone Encounter (Signed)
My Chart letter sent outlining instructions for upcoming stress test on 01/05/23 at 10:00.

## 2023-01-05 ENCOUNTER — Ambulatory Visit (HOSPITAL_COMMUNITY): Payer: Medicare HMO | Attending: Cardiology

## 2023-01-05 DIAGNOSIS — I200 Unstable angina: Secondary | ICD-10-CM

## 2023-01-05 LAB — MYOCARDIAL PERFUSION IMAGING
LV dias vol: 100 mL (ref 62–150)
LV sys vol: 48 mL
Nuc Stress EF: 52 %
Peak HR: 90 {beats}/min
Rest HR: 61 {beats}/min
Rest Nuclear Isotope Dose: 10.2 mCi
SDS: 2
SRS: 0
SSS: 2
ST Depression (mm): 0 mm
Stress Nuclear Isotope Dose: 31.3 mCi
TID: 0.91

## 2023-01-05 MED ORDER — TECHNETIUM TC 99M TETROFOSMIN IV KIT
31.3000 | PACK | Freq: Once | INTRAVENOUS | Status: AC | PRN
  Administered 2023-01-05: 31.3 via INTRAVENOUS

## 2023-01-05 MED ORDER — REGADENOSON 0.4 MG/5ML IV SOLN
0.4000 mg | Freq: Once | INTRAVENOUS | Status: AC
  Administered 2023-01-05: 0.4 mg via INTRAVENOUS

## 2023-01-05 MED ORDER — TECHNETIUM TC 99M TETROFOSMIN IV KIT
10.2000 | PACK | Freq: Once | INTRAVENOUS | Status: AC | PRN
  Administered 2023-01-05: 10.2 via INTRAVENOUS

## 2023-01-17 ENCOUNTER — Ambulatory Visit (INDEPENDENT_AMBULATORY_CARE_PROVIDER_SITE_OTHER): Payer: Medicare HMO | Admitting: Podiatry

## 2023-01-17 ENCOUNTER — Encounter: Payer: Self-pay | Admitting: Podiatry

## 2023-01-17 VITALS — BP 116/49 | HR 78

## 2023-01-17 DIAGNOSIS — M79675 Pain in left toe(s): Secondary | ICD-10-CM

## 2023-01-17 DIAGNOSIS — M79674 Pain in right toe(s): Secondary | ICD-10-CM

## 2023-01-17 DIAGNOSIS — B351 Tinea unguium: Secondary | ICD-10-CM | POA: Diagnosis not present

## 2023-01-17 DIAGNOSIS — E1142 Type 2 diabetes mellitus with diabetic polyneuropathy: Secondary | ICD-10-CM

## 2023-01-17 NOTE — Progress Notes (Signed)
This patient returns to my office for at risk foot care.  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  Patient is taking plavix.  Patient has history of CVA.  This patient is unable to cut nails himself since the patient cannot reach his nails.These nails are painful walking and wearing shoes. This patient presents for at risk foot care today.  General Appearance  Alert, conversant and in no acute stress.  Vascular  Dorsalis pedis and posterior tibial  pulses are weakly  palpable  bilaterally.  Capillary return is within normal limits  Bilaterally.Cold feet left.  .  Absent digital hair  B/L.  Neurologic  Senn-Weinstein monofilament wire test diminished  bilaterally. Muscle power within normal limits bilaterally.  Nails Thick disfigured discolored nails with subungual debris  from hallux to fifth toes bilaterally. No evidence of bacterial infection or drainage bilaterally.  Orthopedic  No limitations of motion  feet .  No crepitus or effusions noted.  No bony pathology or digital deformities noted.  Skin  normotropic skin with no porokeratosis noted bilaterally.  No signs of infections or ulcers noted.  Midfoot DJD left..  Hammer toes  B/L.   Onychomycosis  Pain in right toes  Pain in left toes  Neuropathic pain.  Consent was obtained for treatment procedures.   Mechanical debridement of nails 1-5  bilaterally performed with a nail nipper.  Filed with dremel without incident.  Patient needs to be evaluated for orthoses and treatment.   Return office visit   3 months                   Told patient to return for periodic foot care and evaluation due to potential at risk complications.   Gardiner Barefoot DPM

## 2023-01-25 ENCOUNTER — Ambulatory Visit (INDEPENDENT_AMBULATORY_CARE_PROVIDER_SITE_OTHER): Payer: Medicare HMO | Admitting: Family

## 2023-01-25 ENCOUNTER — Encounter (HOSPITAL_BASED_OUTPATIENT_CLINIC_OR_DEPARTMENT_OTHER): Payer: Self-pay | Admitting: Family

## 2023-01-25 ENCOUNTER — Ambulatory Visit: Payer: Medicare HMO | Admitting: Dietician

## 2023-01-25 VITALS — BP 140/84 | HR 71 | Ht 71.0 in | Wt 211.0 lb

## 2023-01-25 DIAGNOSIS — I25118 Atherosclerotic heart disease of native coronary artery with other forms of angina pectoris: Secondary | ICD-10-CM | POA: Diagnosis not present

## 2023-01-25 DIAGNOSIS — G4733 Obstructive sleep apnea (adult) (pediatric): Secondary | ICD-10-CM | POA: Diagnosis not present

## 2023-01-25 DIAGNOSIS — E785 Hyperlipidemia, unspecified: Secondary | ICD-10-CM | POA: Diagnosis not present

## 2023-01-25 DIAGNOSIS — R0609 Other forms of dyspnea: Secondary | ICD-10-CM

## 2023-01-25 DIAGNOSIS — I10 Essential (primary) hypertension: Secondary | ICD-10-CM

## 2023-01-25 DIAGNOSIS — D649 Anemia, unspecified: Secondary | ICD-10-CM

## 2023-01-25 DIAGNOSIS — I5042 Chronic combined systolic (congestive) and diastolic (congestive) heart failure: Secondary | ICD-10-CM | POA: Diagnosis not present

## 2023-01-25 MED ORDER — AMLODIPINE-ATORVASTATIN 5-40 MG PO TABS: 1.0000 | ORAL_TABLET | Freq: Every day | ORAL | 3 refills | Status: DC

## 2023-01-25 MED ORDER — CARVEDILOL 25 MG PO TABS: 25.0000 mg | ORAL_TABLET | Freq: Two times a day (BID) | ORAL | 3 refills | Status: DC

## 2023-01-25 NOTE — Patient Instructions (Addendum)
Medication Instructions:  Your physician has recommended you make the following change in your medication:    STOP Amlodipine  STOP Atorvastatin _____________________________________  START Amlodipine-Atorvastatin 5-'40mg'$  daily  We have done this to make your medications easier with a combination tablet  CHANGE Carvedilol to one '25mg'$  tablet twice daily You are currently taking two 12.'5mg'$  in the morning and one in the evening. When you get the new prescription you will only take ONE tablet twice daily.   *If you need a refill on your cardiac medications before your next appointment, please call your pharmacy*   Lab Work: Your physician recommends that you return for lab work today: thyroid panel, CBC, BMP  If you are still anemic we may consider referral to gastroenterology to look for any possible bleeding.   If you have labs (blood work) drawn today and your tests are completely normal, you will receive your results only by: [place] (if you have MyChart) OR A paper copy in the mail If you have any lab test that is abnormal or we need to change your treatment, we will call you to review the results.   Testing/Procedures: Your physician has requested that you have an echocardiogram. Echocardiography is a painless test that uses sound waves to create images of your heart. It provides your doctor with information about the size and shape of your heart and how well your heart's chambers and valves are working. This procedure takes approximately one hour. There are no restrictions for this procedure. Please do NOT wear cologne, perfume, aftershave, or lotions (deodorant is allowed). Please arrive 15 minutes prior to your appointment time.   Your stress test showed prior heart attack but no new blockage which is good!  Follow-Up: At [facility], you and your health needs are our priority.  As part of our continuing mission to provide you with exceptional heart care, we  have created designated Provider Care Teams.  These Care Teams include your primary Cardiologist (physician) and Advanced Practice Providers (APPs -  Physician Assistants and Nurse Practitioners) who all work together to provide you with the care you need, when you need it.  We recommend signing up for the patient portal called "MyChart".  Sign up information is provided on this After Visit Summary.  MyChart is used to connect with patients for Virtual Visits (Telemedicine).  Patients are able to view lab/test results, encounter notes, upcoming appointments, etc.  Non-urgent messages can be sent to your provider as well.   To learn more about what you can do with MyChart, go to [email or web address].    Your next appointment:   2-3 month(s)  Provider:   Skeet Latch, MD or Laurann Montana, NP    Other Instructions  Recommend slowly returning to your exercise regimen.

## 2023-01-25 NOTE — Progress Notes (Unsigned)
Office Visit    Patient Name: [name] Date of Encounter: 01/25/2023  PCP:  Binnie Rail, MD   [place]  Cardiologist:  Skeet Latch, MD *** Advanced Practice Provider:  No care team member to display Electrophysiologist:  None  {Press F2 to show EP APP, CHF, sleep or structural heart MD               :[id number]  { Click here to update then REFRESH NOTE - MD (PCP) or APP (Team Member)  Change PCP Type for MD, Specialty for APP is either Cardiology or Clinical Cardiac Electrophysiology  :[id number]  Chief Complaint    [name] is a 72 y.o. male presents today for ***   Past Medical History    Past Medical History:  Diagnosis Date   ALLERGIC RHINITIS    CAD, NATIVE VESSEL    BMS to [place], DES to BMS 2005   Chronic back pain    herniated disc   Chronic combined systolic and diastolic heart failure ([place]) 06/29/2016   Chronic kidney disease    Constipation    takes Carafate four times day   DIAB W/UNSPEC COMP TYPE II/UNSPEC TYPE UNCNTRL    ERECTILE DYSFUNCTION    GERD    HYPERLIPIDEMIA-MIXED    HYPERTENSION, BENIGN    LBBB (left bundle branch block) 02/02/2016   MORTON'S NEUROMA, RIGHT    OSA on CPAP 09/09/2020   Peripheral neuropathy    Seasonal allergies    takes Allegra and Benadryl daily prn;uses Flonase daily   SHOULDER PAIN, RIGHT    Snoring 09/09/2020   Stroke, 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, s/p CIR    TIA on medication 02/2012   Unstable angina ([place]) 07/09/2020   Past Surgical History:  Procedure Laterality Date   ANGIOPLASTY     COLONOSCOPY     CORONARY ANGIOPLASTY  2005   2 stents   CORONARY STENT INTERVENTION N/A 07/10/2020   Procedure: CORONARY STENT INTERVENTION;  Surgeon: Martinique, Peter M, MD;  Location: [facility];  Service: Cardiovascular;  Laterality: N/A;   DENTAL SURGERY     LARYNGOPLASTY  08/07/2012   Procedure: LARYNGOPLASTY;  Surgeon: Izora Gala, MD;  Location:  [facility];  Service: ENT;  Laterality: Left;  Left Vocal Cord Medialyzation   LEFT HEART CATH AND CORONARY ANGIOGRAPHY N/A 07/10/2020   Procedure: LEFT HEART CATH AND CORONARY ANGIOGRAPHY;  Surgeon: Martinique, Peter M, MD;  Location: [facility];  Service: Cardiovascular;  Laterality: N/A;   left knee surgury     x 2    stent  2001, 2004   coronary stents    Allergies  Allergies  Allergen Reactions   Penicillins Anaphylaxis   Pneumococcal Vaccines Anaphylaxis   Shellfish Allergy Anaphylaxis   Influenza Vaccines    Lisinopril Cough   Sildenafil Other (See Comments)    Other reaction(s): Headache, Dizziness   Topiramate Other (See Comments)    Chest spasms and numbness   Zofran [Ondansetron]    Amlodipine Other (See Comments)    LE edema   Levitra [Vardenafil] Other (See Comments)    headaches   Metformin Nausea And Vomiting   Tadalafil Other (See Comments)    dizziness    History of Present Illness    [name] is a 72 y.o. male with a hx of *** last seen ***.  Feels "weak" on his left side. Notes left sided chest pain, weakness. Notes  this is persistent at rest or with activity. Reports his breathing has been okay. Notes he is getting sweaty when walking around the house which is less activity than usual. Had stopped working out at [place]  He has not had repeat CBC. He has had dark stools which are also loose. He does not take iron nor Pepto Bismol.   Does not check blood pressure at home.   Reports both lightheadedness and dizziness most notable with position changes. Usually eats a late breakfast and then dinner. Drinks 3 bottles of water per day and a glass of tea.   Lives with his daughter and 3 grandkids. 29 months, 79 year old, 51 year olds.   Wonders if his medications could be simplified.  Taking 2 in am and 1 at night  Swelling in his toes  Notes on his left side he feels a lot of pressure in his intestines - when he does go to the bathroom does not  feel like it empties completely. For about a month [place]  EKGs/Labs/Other Studies Reviewed:   The following studies were reviewed today: ***  EKG:  EKG is *** ordered today.  The ekg ordered today demonstrates ***  Recent Labs: 12/24/2022: ALT 15; BUN 34; Creatinine, Ser 2.16; Hemoglobin 10.0; Platelets 161; Potassium 4.0; Sodium 138  Recent Lipid Panel    Component Value Date/Time   CHOL 116 12/24/2022 0812   TRIG 95 12/24/2022 0812   TRIG 200 04/20/2009 0000   HDL 60 12/24/2022 0812   CHOLHDL 1.9 12/24/2022 0812   CHOLHDL 3 04/15/2020 1128   VLDL 27.2 04/15/2020 1128   LDLCALC 38 12/24/2022 0812   LDLDIRECT 63.0 01/12/2016 0931    Risk Assessment/Calculations:  {Does this patient have ATRIAL FIBRILLATION?[phone]}  Home Medications   Current Meds  Medication Sig   amLODipine (NORVASC) 5 MG tablet Take 5 mg by mouth daily.   aspirin EC 81 MG tablet Take 1 tablet (81 mg total) by mouth daily. Swallow whole.   atorvastatin (LIPITOR) 40 MG tablet TAKE 1 TABLET EVERY DAY   Blood Pressure Monitoring (BLOOD PRESSURE CUFF) MISC 1 Units by Does not apply route daily.   calcium carbonate (TUMS - DOSED IN MG ELEMENTAL CALCIUM) 500 MG chewable tablet Chew 1 tablet by mouth daily as needed for indigestion or heartburn.   carvedilol (COREG) 12.5 MG tablet TAKE 1 & 1/2 (ONE & ONE-HALF) TABLETS BY MOUTH TWICE DAILY. Please keep your upcoming appointment for refills.   chlorhexidine (PERIDEX) 0.12 % solution Use as directed 15 mLs in the mouth or throat 2 (two) times daily.   chlorthalidone (HYGROTON) 25 MG tablet Take 1 tablet (25 mg total) by mouth daily.   clopidogrel (PLAVIX) 75 MG tablet TAKE 1 TABLET EVERY DAY NEED APPOINTMENT FOR FURTHER REFILLS   Continuous Blood Gluc Sensor (FREESTYLE LIBRE 2 SENSOR) MISC 1 Device by Does not apply route every 14 (fourteen) days.   empagliflozin (JARDIANCE) 25 MG TABS tablet Take 1 tablet (25 mg total) by mouth daily before breakfast.    furosemide (LASIX) 40 MG tablet TAKE 1 TABLET (40 MG TOTAL) BY MOUTH DAILY. NEED APPT.   glucose blood (ACCU-CHEK AVIVA PLUS) test strip uad tid   Insulin Lispro Prot & Lispro (HUMALOG MIX 50/50 KWIKPEN) (50-50) 100 UNIT/ML Kwikpen Inject 24 Units into the skin daily with breakfast.   Insulin Pen Needle 32G X 4 MM MISC Use 1x a day   losartan (COZAAR) 100 MG tablet TAKE 1 TABLET EVERY DAY  Microlet Lancets MISC UAD to check sugars TID.  E11.22   nitroGLYCERIN (NITROSTAT) 0.4 MG SL tablet Place 1 tablet (0.4 mg total) under the tongue every 5 (five) minutes as needed for chest pain (up to 3 doses).   senna-docusate (SENOKOT-S) 8.6-50 MG per tablet Take 2 tablets by mouth 2 (two) times daily. For constipation.   Current Facility-Administered Medications for the 01/25/23 encounter (Office Visit) with Loel Dubonnet, NP  Medication   sodium chloride flush (NS) 0.9 % injection 3 mL     Review of Systems   ***   All other systems reviewed and are otherwise negative except as noted above.  Physical Exam    VS:  BP (!) 140/84   Pulse 71   Ht '5\' 11"'$  (1.803 m)   Wt 211 lb (95.7 kg)   BMI 29.43 kg/m  , BMI Body mass index is 29.43 kg/m.  Wt Readings from Last 3 Encounters:  01/25/23 211 lb (95.7 kg)  01/05/23 212 lb (96.2 kg)  12/23/22 212 lb 4.8 oz (96.3 kg)     GEN: Well nourished, well developed, in no acute distress. HEENT: normal. Neck: Supple, no JVD, carotid bruits, or masses. Cardiac: ***RRR, no murmurs, rubs, or gallops. No clubbing, cyanosis, edema.  ***Radials/PT 2+ and equal bilaterally.  Respiratory:  ***Respirations regular and unlabored, clear to auscultation bilaterally. GI: Soft, nontender, nondistended. MS: No deformity or atrophy. Skin: Warm and dry, no rash. Neuro:  Strength and sensation are intact. Psych: Normal affect.  Assessment & Plan    ***  The patient's 1st BP is elevated (>139/89)*** Repeat BP and {Click to enter a 2nd BP Refresh Note  :1}       Disposition: Follow up {follow up:15908} with Skeet Latch, MD or APP.  Signed, Loel Dubonnet, NP 01/25/2023, 10:40 AM [place]

## 2023-01-26 LAB — CBC
Hematocrit: 34.2 % — ABNORMAL LOW (ref 37.5–51.0)
Hemoglobin: 11.3 g/dL — ABNORMAL LOW (ref 13.0–17.7)
MCH: 30.8 pg (ref 26.6–33.0)
MCHC: 33 g/dL (ref 31.5–35.7)
MCV: 93 fL (ref 79–97)
Platelets: 188 10*3/uL (ref 150–450)
RBC: 3.67 x10E6/uL — ABNORMAL LOW (ref 4.14–5.80)
RDW: 12.2 % (ref 11.6–15.4)
WBC: 6.7 10*3/uL (ref 3.4–10.8)

## 2023-01-26 LAB — THYROID PANEL WITH TSH
Free Thyroxine Index: 1.9 (ref 1.2–4.9)
T3 Uptake Ratio: 25 % (ref 24–39)
T4, Total: 7.5 ug/dL (ref 4.5–12.0)
TSH: 2.61 u[IU]/mL (ref 0.450–4.500)

## 2023-01-26 LAB — BASIC METABOLIC PANEL
BUN/Creatinine Ratio: 17 (ref 10–24)
BUN: 32 mg/dL — ABNORMAL HIGH (ref 8–27)
CO2: 21 mmol/L (ref 20–29)
Calcium: 9.8 mg/dL (ref 8.6–10.2)
Chloride: 103 mmol/L (ref 96–106)
Creatinine, Ser: 1.85 mg/dL — ABNORMAL HIGH (ref 0.76–1.27)
Glucose: 184 mg/dL — ABNORMAL HIGH (ref 70–99)
Potassium: 4.6 mmol/L (ref 3.5–5.2)
Sodium: 138 mmol/L (ref 134–144)
eGFR: 38 mL/min/{1.73_m2} — ABNORMAL LOW (ref >59)

## 2023-01-27 ENCOUNTER — Telehealth (HOSPITAL_BASED_OUTPATIENT_CLINIC_OR_DEPARTMENT_OTHER): Payer: Self-pay

## 2023-01-27 DIAGNOSIS — D649 Anemia, unspecified: Secondary | ICD-10-CM

## 2023-01-27 NOTE — Telephone Encounter (Addendum)
Call attempted, no answer, VM full     ----- Message from Loel Dubonnet, NP sent at 01/26/2023 12:07 PM EST ----- Stable kidney function. Normal electrolytes. CBC shows slight improvement in anemia from previous. Normal thyroid function.   Due to reports of dark stool in clinic visit and persistent anemia, recommend referral to GI.

## 2023-01-28 ENCOUNTER — Encounter: Payer: Self-pay | Admitting: Gastroenterology

## 2023-01-28 NOTE — Telephone Encounter (Signed)
Results called to patient who verbalizes understanding!         ----- Message from Loel Dubonnet, NP sent at 01/26/2023 12:07 PM EST ----- Stable kidney function. Normal electrolytes. CBC shows slight improvement in anemia from previous. Normal thyroid function.    Due to reports of dark stool in clinic visit and persistent anemia, recommend referral to GI.

## 2023-02-06 ENCOUNTER — Other Ambulatory Visit (HOSPITAL_BASED_OUTPATIENT_CLINIC_OR_DEPARTMENT_OTHER): Payer: Self-pay | Admitting: Cardiovascular Disease

## 2023-02-07 ENCOUNTER — Telehealth: Payer: Self-pay

## 2023-02-07 NOTE — Telephone Encounter (Signed)
Rx(s) sent to pharmacy electronically.  

## 2023-02-07 NOTE — Telephone Encounter (Signed)
T, Per my records, he is taking 24 units of 50/50 insulin before breakfast.  We can try to adjust the dose to 18-20 units and see if the lows persist.  However, if the sugars remain uncontrolled afterwards, we will need to change to a long-acting + short acting insulin regimen.  Please let us know.

## 2023-02-07 NOTE — Telephone Encounter (Signed)
Pt called to advise he has experienced multiple days of low blood sugars on his [place] sensor. Pt is connected to clinic.

## 2023-02-08 NOTE — Telephone Encounter (Signed)
Pt contacted and advised Per my records, he is taking 24 units of 50/50 insulin before breakfast.  We can try to adjust the dose to 18-20 units and see if the lows persist.  However, if the sugars remain uncontrolled afterwards, we will need to change to a long-acting + short acting insulin regimen.  Please let us know. Pt will follow up as needed.

## 2023-02-15 ENCOUNTER — Ambulatory Visit (INDEPENDENT_AMBULATORY_CARE_PROVIDER_SITE_OTHER): Payer: Medicare HMO

## 2023-02-15 ENCOUNTER — Telehealth (HOSPITAL_BASED_OUTPATIENT_CLINIC_OR_DEPARTMENT_OTHER): Payer: Self-pay

## 2023-02-15 DIAGNOSIS — I10 Essential (primary) hypertension: Secondary | ICD-10-CM

## 2023-02-15 DIAGNOSIS — I5042 Chronic combined systolic (congestive) and diastolic (congestive) heart failure: Secondary | ICD-10-CM

## 2023-02-15 DIAGNOSIS — I25118 Atherosclerotic heart disease of native coronary artery with other forms of angina pectoris: Secondary | ICD-10-CM

## 2023-02-15 LAB — ECHOCARDIOGRAM COMPLETE
Area-P 1/2: 3.91 cm2
Est EF: 55
S' Lateral: 2.9 cm

## 2023-02-15 NOTE — Telephone Encounter (Signed)
Pt in for an ECHO had questions about his medications.  Pt has prescription Amlodipine/Atorvastatin. Pt states he is allergic to amlodipine and will not take this medication.  Per  Laurann Montana NP Amlodipine/Atorvastatin discontinued and pt instructed to take Atorvastatin 40 mg daily.  Also, reviewed instructions for Carvedilol with patient who verbalized understanding.  Georgana Curio MHA RN CCM

## 2023-02-15 NOTE — Addendum Note (Signed)
Addended by: Georgana Curio D on: 02/15/2023 03:41 PM   Modules accepted: Orders

## 2023-02-16 ENCOUNTER — Telehealth (HOSPITAL_BASED_OUTPATIENT_CLINIC_OR_DEPARTMENT_OTHER): Payer: Self-pay

## 2023-02-16 ENCOUNTER — Other Ambulatory Visit (HOSPITAL_BASED_OUTPATIENT_CLINIC_OR_DEPARTMENT_OTHER): Payer: Self-pay | Admitting: Cardiovascular Disease

## 2023-02-16 NOTE — Telephone Encounter (Signed)
-----   Message from Loel Dubonnet, NP sent at 02/16/2023 10:34 AM EDT ----- Echocardiogram with normal heart pumping function. There is thickening of stiffness of heart muscle likely from hypertension - we prevent this from worsening by keeping blood pressure well controlled. No significant valvular abnormalities. Stable compared to previous. Good result!

## 2023-02-16 NOTE — Telephone Encounter (Signed)
Pt called with Echocardiogram results. Questions asked and answered. Georgana Curio MHA RN CCM

## 2023-02-16 NOTE — Telephone Encounter (Signed)
Rx request sent to pharmacy.  

## 2023-02-26 LAB — LAB REPORT - SCANNED
Albumin, Urine POC: 116.4
Creatinine, POC: 137.7 mg/dL
EGFR: 38
Microalb Creat Ratio: 85

## 2023-03-04 ENCOUNTER — Encounter: Payer: Self-pay | Admitting: Internal Medicine

## 2023-03-04 ENCOUNTER — Ambulatory Visit (INDEPENDENT_AMBULATORY_CARE_PROVIDER_SITE_OTHER): Payer: Medicare HMO | Admitting: Internal Medicine

## 2023-03-04 VITALS — BP 120/62 | HR 71 | Temp 98.4°F | Ht 71.0 in | Wt 214.0 lb

## 2023-03-04 DIAGNOSIS — I10 Essential (primary) hypertension: Secondary | ICD-10-CM

## 2023-03-04 DIAGNOSIS — I69354 Hemiplegia and hemiparesis following cerebral infarction affecting left non-dominant side: Secondary | ICD-10-CM | POA: Diagnosis not present

## 2023-03-04 DIAGNOSIS — M549 Dorsalgia, unspecified: Secondary | ICD-10-CM | POA: Diagnosis not present

## 2023-03-04 MED ORDER — TIZANIDINE HCL 2 MG PO TABS: 2.0000 mg | ORAL_TABLET | Freq: Every evening | ORAL | 0 refills | Status: DC | PRN

## 2023-03-04 NOTE — Assessment & Plan Note (Signed)
Subacute 2 months of mid and lower back pain, also left upper back pain Does have tenderness with palpation Seems to be muscular Will start tizanidine 2 mg nightly Will refer to sports medicine for further evaluation of cause and treatment Referral for physical therapy

## 2023-03-04 NOTE — Progress Notes (Signed)
Subjective:    Patient ID: [name], male    DOB: 29-May-1951, 72 y.o.   MRN: [id number]      HPI [name] is here for  Chief Complaint  Patient presents with   Back Pain    Mid to lower back pain went kidney doc and says pain is not related to kidney suggested he get a referral to neuro or ortho. Also having a itchy throat      Back pain - mid to lower back - started a couple of months ago.  No injuries or falls.  Pain is constant.  Worse with laying on it and movement.  Better with sitting or standing.  He also has pain in his left upper back, neck.  States he is having some pain from his left buttock region across to his right buttock region.  Has some increased pain in his left side which she has had since the stroke, but it seems to be worse.  Does note some increased spasms.   Medications and allergies reviewed with patient and updated if appropriate.  Current Outpatient Medications on File Prior to Visit  Medication Sig Dispense Refill   aspirin EC 81 MG tablet Take 1 tablet (81 mg total) by mouth daily. Swallow whole. 30 tablet 11   atorvastatin (LIPITOR) 40 MG tablet Take 40 mg by mouth daily.     Blood Pressure Monitoring (BLOOD PRESSURE CUFF) MISC 1 Units by Does not apply route daily. 1 each 0   calcium carbonate (TUMS - DOSED IN MG ELEMENTAL CALCIUM) 500 MG chewable tablet Chew 1 tablet by mouth daily as needed for indigestion or heartburn.     carvedilol (COREG) 25 MG tablet Take 1 tablet (25 mg total) by mouth 2 (two) times daily. 180 tablet 3   chlorhexidine (PERIDEX) 0.12 % solution Use as directed 15 mLs in the mouth or throat 2 (two) times daily.     chlorthalidone (HYGROTON) 25 MG tablet TAKE 1 TABLET EVERY DAY 90 tablet 3   clopidogrel (PLAVIX) 75 MG tablet TAKE 1 TABLET EVERY DAY. NEED APPOINTMENT FOR FURTHER REFILLS 90 tablet 3   Continuous Blood Gluc Sensor (FREESTYLE LIBRE 2 SENSOR) MISC 1 Device by Does not apply route every 14 (fourteen)  days. 6 each 3   empagliflozin (JARDIANCE) 25 MG TABS tablet Take 1 tablet (25 mg total) by mouth daily before breakfast. 90 tablet 3   furosemide (LASIX) 40 MG tablet TAKE 1 TABLET (40 MG TOTAL) BY MOUTH DAILY. NEED APPT. 90 tablet 3   glucose blood (ACCU-CHEK AVIVA PLUS) test strip uad tid 100 each 12   Insulin Lispro Prot & Lispro (HUMALOG MIX 50/50 KWIKPEN) (50-50) 100 UNIT/ML Kwikpen Inject 24 Units into the skin daily with breakfast. 15 mL 1   Insulin Pen Needle 32G X 4 MM MISC Use 1x a day 100 each 3   losartan (COZAAR) 100 MG tablet TAKE 1 TABLET EVERY DAY 90 tablet 3   Microlet Lancets MISC UAD to check sugars TID.  E11.22 270 each 3   nitroGLYCERIN (NITROSTAT) 0.4 MG SL tablet Place 1 tablet (0.4 mg total) under the tongue every 5 (five) minutes as needed for chest pain (up to 3 doses). 25 tablet 3   senna-docusate (SENOKOT-S) 8.6-50 MG per tablet Take 2 tablets by mouth 2 (two) times daily. For constipation.     Current Facility-Administered Medications on File Prior to Visit  Medication Dose Route Frequency Provider Last Rate Last Admin   sodium  chloride flush (NS) 0.9 % injection 3 mL  3 mL Intravenous Q12H Chilton Si, MD        Review of Systems     Objective:   Vitals:   03/04/23 1537  BP: 120/62  Pulse: 71  Temp: 98.4 F (36.9 C)  SpO2: 100%   BP Readings from Last 3 Encounters:  03/04/23 120/62  01/25/23 (!) 140/84  01/17/23 (!) 116/49   Wt Readings from Last 3 Encounters:  03/04/23 214 lb (97.1 kg)  01/25/23 211 lb (95.7 kg)  01/05/23 212 lb (96.2 kg)   Body mass index is 29.85 kg/m.    Physical Exam Constitutional:      General: He is not in acute distress.    Appearance: Normal appearance. He is not ill-appearing.  HENT:     Head: Normocephalic and atraumatic.  Musculoskeletal:        General: Tenderness (Tenderness with palpation left lower hide and trapezius, tenderness thoracic, lumbar spines and paravertebral muscles) present. No  swelling or deformity.     Right lower leg: No edema.     Left lower leg: No edema.  Neurological:     Mental Status: He is alert.     Comments: Stiffness, mild spasms left upper and lower extremity-secondary to stroke            Assessment & Plan:    See Problem List for Assessment and Plan of chronic medical problems.

## 2023-03-04 NOTE — Assessment & Plan Note (Signed)
Chronic Secondary to CVA Has gotten a little worse Will refer to PT which will hopefully help Start tizanidine 2 mg at bedtime

## 2023-03-04 NOTE — Assessment & Plan Note (Signed)
Chronic Blood pressure controlled Continue losartan 100 mg daily, chlorthalidone 25 mg daily, Coreg 25 mg twice daily

## 2023-03-04 NOTE — Patient Instructions (Addendum)
     Make an appt with sports medicine down stairs for back pain.      Medications changes include :   tizanidine 2 mg at bedtime    A referral was ordered for sports medicine and physical therapy.     Someone will call you to schedule an appointment.

## 2023-03-08 ENCOUNTER — Ambulatory Visit: Payer: Medicare HMO | Admitting: Gastroenterology

## 2023-03-08 ENCOUNTER — Ambulatory Visit (INDEPENDENT_AMBULATORY_CARE_PROVIDER_SITE_OTHER): Payer: Medicare HMO

## 2023-03-08 ENCOUNTER — Ambulatory Visit (INDEPENDENT_AMBULATORY_CARE_PROVIDER_SITE_OTHER): Payer: Medicare HMO | Admitting: Family Medicine

## 2023-03-08 ENCOUNTER — Ambulatory Visit: Payer: Medicare HMO | Attending: Internal Medicine

## 2023-03-08 VITALS — BP 118/62 | HR 71 | Ht 71.0 in | Wt 208.0 lb

## 2023-03-08 DIAGNOSIS — N39498 Other specified urinary incontinence: Secondary | ICD-10-CM

## 2023-03-08 DIAGNOSIS — M549 Dorsalgia, unspecified: Secondary | ICD-10-CM | POA: Insufficient documentation

## 2023-03-08 DIAGNOSIS — G8929 Other chronic pain: Secondary | ICD-10-CM | POA: Diagnosis not present

## 2023-03-08 DIAGNOSIS — R262 Difficulty in walking, not elsewhere classified: Secondary | ICD-10-CM | POA: Diagnosis present

## 2023-03-08 DIAGNOSIS — R2681 Unsteadiness on feet: Secondary | ICD-10-CM | POA: Insufficient documentation

## 2023-03-08 DIAGNOSIS — R2689 Other abnormalities of gait and mobility: Secondary | ICD-10-CM | POA: Insufficient documentation

## 2023-03-08 DIAGNOSIS — R293 Abnormal posture: Secondary | ICD-10-CM | POA: Insufficient documentation

## 2023-03-08 DIAGNOSIS — R278 Other lack of coordination: Secondary | ICD-10-CM | POA: Diagnosis present

## 2023-03-08 DIAGNOSIS — M545 Low back pain, unspecified: Secondary | ICD-10-CM

## 2023-03-08 DIAGNOSIS — M6281 Muscle weakness (generalized): Secondary | ICD-10-CM | POA: Insufficient documentation

## 2023-03-08 DIAGNOSIS — I69354 Hemiplegia and hemiparesis following cerebral infarction affecting left non-dominant side: Secondary | ICD-10-CM | POA: Diagnosis not present

## 2023-03-08 NOTE — Patient Instructions (Addendum)
Thank you for coming in today.   Please get an Xray today before you leave   You should hear from MRI scheduling within 1 week. If you do not hear please let me know.    Take the tizanidine as already prescribed

## 2023-03-08 NOTE — Progress Notes (Signed)
   Rubin Payor, PhD, LAT, ATC acting as a scribe for Clementeen Graham, MD.  Subjective:    CC: Mid-to-low back pain  HPI: Pt is a 72 y/o male c/o mid-to-low back pain ongoing for a couple months. No falls or injury. Pt locates pain to both side of his mid and low back. He has paral on the L side from a stroke in 2013. Pt started having urinary incontinence after his visit w/ PCP, but this has resolved, however he describes dark color. He also notes diarrhea Friday through Monday.   Radiating pain: yes- R buttock, groin LE numbness/tingling: yes LE weakness: yes Aggravates: standing, walking,  Treatments tried: going to the gym  Dx imaging: 07/13/19 L-spine XR  Pertinent review of Systems: No fevers or chills  Relevant historical information: History of a stroke causing left-sided weakness.   Objective:    Vitals:   03/08/23 1104  BP: 118/62  Pulse: 71  SpO2: 98%   General: Well Developed, well nourished, and in no acute distress.   MSK: L-spine normal appearing Tender palpation lumbar paraspinal musculature worse on the left Decreased lumbar motion Reflexes generally intact. Lower extremity strength decreased left leg due to stroke no isolated individual nerve root weakness identified left leg or right leg. Right lower extremity strength is intact.   Lab and Radiology Results  X-ray images lumbar spine obtained today personally and independently interpreted No acute fractures.  DDD worse at L5-S1. Await formal radiology review   Impression and Recommendations:    Assessment and Plan: 72 y.o. male with acute low back pain with incontinence of urine.  This occurs in the setting of a stroke causing left-sided weakness so assessing weakness is very challenging.  However the urinary incontinence could be a neurologic component which raises the urgency.  Plan for MRI lumbar spine to evaluate for potential lumbar radiculopathy causing his symptoms.  [facility] therapy has  already been ordered by his PCP and will start today following the visit.  That should be helpful.  Additionally his primary care provider prescribed tizanidine which she can use as needed.Marland Kitchen  PDMP not reviewed this encounter. Orders Placed This Encounter  Procedures   DG Lumbar Spine 2-3 Views    Standing Status:   Future    Number of Occurrences:   1    Standing Expiration Date:   04/07/2023    Order Specific Question:   Reason for Exam (SYMPTOM  OR DIAGNOSIS REQUIRED)    Answer:   low back pain    Order Specific Question:   Preferred imaging location?    Answer:   [facility]   MR Lumbar Spine Wo Contrast    Standing Status:   Future    Standing Expiration Date:   03/07/2024    Order Specific Question:   What is the patient's sedation requirement?    Answer:   No Sedation    Order Specific Question:   Does the patient have a pacemaker or implanted devices?    Answer:   No    Order Specific Question:   Preferred imaging location?    Answer:   [facility] (table limit-550lbs)   No orders of the defined types were placed in this encounter.   Discussed warning signs or symptoms. Please see discharge instructions. Patient expresses understanding.   The above documentation has been reviewed and is accurate and complete Clementeen Graham, M.D.

## 2023-03-08 NOTE — Therapy (Unsigned)
OUTPATIENT PHYSICAL THERAPY NEURO EVALUATION   Patient Name: [name] MRN: [id number] DOB:1951/06/17, 72 y.o., male Today's Date: 03/08/2023   PCP: Pincus Sanes, MD REFERRING PROVIDER: Pincus Sanes, MD  END OF SESSION:  PT End of Session - 03/08/23 1535     Visit Number 1    Number of Visits 13    Date for PT Re-Evaluation 05/17/23    Authorization Type aetna medicare    PT Start Time 1533    PT Stop Time 1616    PT Time Calculation (min) 43 min    Activity Tolerance Patient tolerated treatment well    Behavior During Therapy [facility] for tasks assessed/performed             Past Medical History:  Diagnosis Date   ALLERGIC RHINITIS    CAD, NATIVE VESSEL    BMS to OM1 2001, DES to BMS 2005   Chronic back pain    herniated disc   Chronic combined systolic and diastolic heart failure 06/29/2016   Chronic kidney disease    Constipation    takes Carafate four times day   DIAB W/UNSPEC COMP TYPE II/UNSPEC TYPE UNCNTRL    ERECTILE DYSFUNCTION    GERD    HYPERLIPIDEMIA-MIXED    HYPERTENSION, BENIGN    LBBB (left bundle branch block) 02/02/2016   MORTON'S NEUROMA, RIGHT    OSA on CPAP 09/09/2020   Peripheral neuropathy    Seasonal allergies    takes Allegra and Benadryl daily prn;uses Flonase daily   SHOULDER PAIN, RIGHT    Snoring 09/09/2020   Stroke, thrombotic 07/2012   L HP + hemiparesis, s/p CIR    TIA on medication 02/2012   Unstable angina 07/09/2020   Past Surgical History:  Procedure Laterality Date   ANGIOPLASTY     COLONOSCOPY     CORONARY ANGIOPLASTY  2005   2 stents   CORONARY STENT INTERVENTION N/A 07/10/2020   Procedure: CORONARY STENT INTERVENTION;  Surgeon: Swaziland, Peter M, MD;  Location: [facility] INVASIVE CV LAB;  Service: Cardiovascular;  Laterality: N/A;   DENTAL SURGERY     LARYNGOPLASTY  08/07/2012   Procedure: LARYNGOPLASTY;  Surgeon: Serena Colonel, MD;  Location: [facility] OR;  Service: ENT;  Laterality: Left;  Left Vocal Cord Medialyzation    LEFT HEART CATH AND CORONARY ANGIOGRAPHY N/A 07/10/2020   Procedure: LEFT HEART CATH AND CORONARY ANGIOGRAPHY;  Surgeon: Swaziland, Peter M, MD;  Location: [facility] INVASIVE CV LAB;  Service: Cardiovascular;  Laterality: N/A;   left knee surgury     x 2    stent  2001, 2004   coronary stents   Patient Active Problem List   Diagnosis Date Noted   Back pain 03/04/2023   Diarrhea 06/09/2022   Ganglion cyst of wrist, left 12/14/2021   Viral gastroenteritis 12/14/2021   Urinary frequency 11/06/2021   Coronary artery disease 03/25/2021   Hoarse 03/25/2021   Major depressive disorder, recurrent episode, moderate 03/25/2021   Major depressive disorder, single episode 03/25/2021   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   Observation and evaluation for other specified suspected conditions 03/25/2021   Vocal cord paralysis 03/25/2021   Diabetic peripheral neuropathy 12/25/2020   Orthopnea 12/16/2020   OSA on CPAP 09/09/2020   Unstable angina 07/09/2020   Sciatica of left side 07/11/2019   Cough 07/11/2019   Edema leg 10/26/2018   History of stroke 10/26/2018   DOE (dyspnea on exertion) 10/26/2018   Left hip pain 07/14/2018   Acute left ankle pain 07/14/2018  Chest pain 07/14/2018   Acute non-recurrent ethmoidal sinusitis 03/13/2018   Nonintractable headache 02/24/2018   CRI (chronic renal insufficiency), stage 3 (moderate) 02/22/2017   Anemia 02/16/2017   Lower respiratory infection 02/16/2017   Chronic combined systolic and diastolic heart failure 06/29/2016   LBBB (left bundle branch block) 02/02/2016   Nonallopathic lesion of lumbosacral region 12/11/2014   Nonallopathic lesion of sacral region 12/11/2014   Nonallopathic lesion of thoracic region 12/11/2014   Arthritis of left hip 11/20/2014   Ischial bursitis of left side 10/28/2014   Hamstring tightness of left lower extremity 09/16/2014   Piriformis syndrome of left side 09/16/2014   Dysphonia 06/22/2013   Hemiparesis affecting left side  as late effect of stroke 10/10/2012   Dysphagia following cerebrovascular accident 08/14/2012   Chronic back pain    Peripheral neuropathy (HCC)    TIA (transient ischemic attack) 04/08/2012   Stroke (HCC) 02/21/2012   MORTON'S NEUROMA, RIGHT 05/29/2010   Poorly controlled type 2 diabetes mellitus with circulatory disorder 04/13/2010   ALLERGIC RHINITIS 04/13/2010   GERD 04/13/2010   ERECTILE DYSFUNCTION 03/12/2009   Essential hypertension 03/12/2009   CAD S/P percutaneous coronary angioplasty 03/12/2009   Hyperlipidemia 03/07/2009    ONSET DATE: 03/04/2023 referral  REFERRING DIAG: M54.9 (ICD-10-CM) - Back pain, unspecified back location, unspecified back pain laterality, unspecified chronicity I69.354 (ICD-10-CM) - Hemiparesis affecting left side as late effect of stroke  THERAPY DIAG:  Abnormal posture  Difficulty in walking, not elsewhere classified  Muscle weakness (generalized)  Other abnormalities of gait and mobility  Unsteadiness on feet  Other lack of coordination  Rationale for Evaluation and Treatment: Rehabilitation  SUBJECTIVE:                                                                                                                                                                                             SUBJECTIVE STATEMENT: Patient arrives to clinic alone, ambulating with SPC. Does have apparent L hemiparesis. Had a CVA in 2013. LBP started in 1997 after a fall. Pain worsened after CVA. Starts in L side and has moved to the R side. Was previously active at the gym, but stopped ~ 1 year ago due to long commute. Did feel better while completing those exercises. Does report that this past weekend his urine was very dark "almost brown" and stools were very loose and "almost black." At the time, he also felt very feverish ("profusely sweating"), but did not take his temp because he felt so poor. This past weekend, he also lost control of both B/B, often  "urinating all over" himself. He reports that his ability  to sense B/B has improved. He states that the pain he experiences in his low back usually feels as though it's traveling from back to front (back through his abdomen).  Pt accompanied by: self  PERTINENT HISTORY: fall in 1997, CVA 2013  PAIN:  Are you having pain? Yes: NPRS scale: 8/10 Pain location: mid/low back  Pain description: dull, sharp, tingling on L low back/buttock Aggravating factors: standing up, walking Relieving factors: laying prone  PRECAUTIONS: Fall and Other: L hemiparesis  WEIGHT BEARING RESTRICTIONS: No  FALLS: Has patient fallen in last 6 months? No  LIVING ENVIRONMENT: Lives with: lives with their family and lives with their daughter Lives in: House/apartment Stairs: No Has following equipment at home: Single point cane, Tour manager, and Grab bars  PLOF: Independent and Requires assistive device for independence, driving short distances, retired  PATIENT GOALS: "strengthen my body"  OBJECTIVE:   DIAGNOSTIC FINDINGS: Lumbar xray still pending at time of eval  COGNITION: Overall cognitive status: Within functional limits for tasks assessed   SENSATION: Reports constant tingling across low back and lower abdomen  COORDINATION: Significant increase in pain with heel/shin Dysmetria L >R with figure 8  POSTURE: rounded shoulders, forward head, and posterior pelvic tilt  LOWER EXTREMITY MMT:   Baseline L hemiparesis with ataxia   BED MOBILITY:  Able to complete independently with increased pain  GAIT: Gait pattern: step to pattern, decreased arm swing- Left, decreased step length- Right, decreased stance time- Left, decreased ankle dorsiflexion- Left, circumduction- Left, ataxic, trunk rotated posterior- Left, and poor foot clearance- Left Distance walked: clinic Assistive device utilized: Single point cane Level of assistance: Modified independence  PATIENT SURVEYS:  Modified Oswestry  26, severe disability    TODAY'S TREATMENT:                                                                                                                              N/a eval   PATIENT EDUCATION: Education details: PT POC, exam findings, LBP red flags  Person educated: Patient Education method: Explanation Education comprehension: verbalized understanding and needs further education  HOME EXERCISE PROGRAM: To be provided pending medical clearance   GOALS: Goals reviewed with patient? Yes  SHORT TERM GOALS: Target date: 04/15/23  Pt will be independent with initial HEP for improved functional strength and reduced pain  Baseline: to be provided Goal status: INITIAL  2.  Patient will improve Oswestry score to </= 22 to indicate a reduction in disability due to LBP Baseline: 26 (severe) Goal status: INITIAL  3.  5x STS Baseline: to be assessed when medically cleared by imaging Goal status: INITIAL  4.  TUG goal Baseline: to be assessed when medically cleared by imaging Goal status: INITIAL   LONG TERM GOALS: Target date: 05/17/23  Pt will be independent with initial HEP for improved functional strength and reduced pain  Baseline: to be provided Goal status: INITIAL  Patient will improve Oswestry score to </=  22 to indicate a reduction in disability due to LBP Baseline: 26 (severe) Goal status: INITIAL  5x STS Baseline: to be assessed when medically cleared by imaging Goal status: INITIAL  TUG goal Baseline: to be assessed when medically cleared by imaging Goal status: INITIAL   ASSESSMENT:  CLINICAL IMPRESSION: Patient is a 72 y.o. male who was seen today for physical therapy evaluation and treatment for chronic low back pain. Patient has had a recent exacerbation in his back pain in the past few months without known MOI. He has several "red flags" in regards to his low back pain including: recent B/B changes, progressive sensory changes and radiating pain from  posterior > anterior. He did have an xray the morning of eval, but at the time of seeing the patient, it was not resulted. He also has an MRI ordered, but not yet scheduled. Low back pain in the setting of chronic spastic hemiparesis is appropriate for PT to address. However, given the extent and severity of his red flags, patient requires further imaging to be completed prior to initiating PT POC.    OBJECTIVE IMPAIRMENTS: Abnormal gait, decreased activity tolerance, decreased coordination, decreased endurance, decreased knowledge of condition, decreased mobility, difficulty walking, decreased ROM, decreased strength, increased muscle spasms, impaired tone, impaired UE functional use, improper body mechanics, postural dysfunction, and pain.   ACTIVITY LIMITATIONS: carrying, lifting, bending, standing, squatting, sleeping, stairs, bed mobility, bathing, hygiene/grooming, locomotion level, and caring for others  PARTICIPATION LIMITATIONS: meal prep, cleaning, laundry, interpersonal relationship, driving, shopping, community activity, and yard work  PERSONAL FACTORS: Age, Past/current experiences, Time since onset of injury/illness/exacerbation, Transportation, and 3+ comorbidities: see above  are also affecting patient's functional outcome.   REHAB POTENTIAL: Fair time since onset, unknown reason for exacerbation  CLINICAL DECISION MAKING: Evolving/moderate complexity  EVALUATION COMPLEXITY: Moderate  PLAN:  PT FREQUENCY: 2x/week  PT DURATION: 6 weeks  PLANNED INTERVENTIONS: Therapeutic exercises, Therapeutic activity, Neuromuscular re-education, Balance training, Gait training, Patient/Family education, Self Care, Joint mobilization, Stair training, Vestibular training, Visual/preceptual remediation/compensation, Orthotic/Fit training, DME instructions, Aquatic Therapy, Electrical stimulation, Spinal mobilization, Cryotherapy, Moist heat, Taping, Manual therapy, and Re-evaluation  PLAN FOR  NEXT SESSION: results from imaging? TUG, 5xSTS, HEP   Westley Foots, PT, DPT, CBIS 03/08/2023, 4:21 PM

## 2023-03-14 NOTE — Progress Notes (Signed)
Lumbar spine x-ray looks okay to radiology.

## 2023-03-15 ENCOUNTER — Ambulatory Visit: Payer: Medicare HMO

## 2023-03-17 ENCOUNTER — Ambulatory Visit: Payer: Medicare HMO

## 2023-03-22 ENCOUNTER — Ambulatory Visit: Payer: Medicare HMO

## 2023-03-24 ENCOUNTER — Ambulatory Visit: Payer: Medicare HMO | Admitting: Physical Therapy

## 2023-03-25 ENCOUNTER — Telehealth: Payer: Self-pay | Admitting: Internal Medicine

## 2023-03-25 NOTE — Telephone Encounter (Signed)
Order was ran through insurance on 4/16 "no pre-cert required". Msg sent to [facility] in [place].

## 2023-03-25 NOTE — Telephone Encounter (Signed)
[facility] called stating they need the Authorization number by 2:00 so pt can be seen at his appt .  Denaj gave me her direct number [phone]

## 2023-03-27 ENCOUNTER — Ambulatory Visit
Admission: RE | Admit: 2023-03-27 | Discharge: 2023-03-27 | Disposition: A | Discharge status: Home / Self Care | Payer: Medicare HMO | Source: Ambulatory Visit | Attending: Family Medicine | Admitting: Family Medicine

## 2023-03-27 DIAGNOSIS — G8929 Other chronic pain: Secondary | ICD-10-CM

## 2023-03-27 DIAGNOSIS — N39498 Other specified urinary incontinence: Secondary | ICD-10-CM

## 2023-03-29 ENCOUNTER — Ambulatory Visit: Payer: Medicare HMO | Attending: Internal Medicine

## 2023-03-29 DIAGNOSIS — R2681 Unsteadiness on feet: Secondary | ICD-10-CM

## 2023-03-29 DIAGNOSIS — R262 Difficulty in walking, not elsewhere classified: Secondary | ICD-10-CM | POA: Diagnosis present

## 2023-03-29 DIAGNOSIS — R278 Other lack of coordination: Secondary | ICD-10-CM | POA: Insufficient documentation

## 2023-03-29 DIAGNOSIS — R2689 Other abnormalities of gait and mobility: Secondary | ICD-10-CM | POA: Diagnosis present

## 2023-03-29 DIAGNOSIS — R293 Abnormal posture: Secondary | ICD-10-CM

## 2023-03-29 DIAGNOSIS — M6281 Muscle weakness (generalized): Secondary | ICD-10-CM | POA: Diagnosis present

## 2023-03-29 NOTE — Therapy (Signed)
OUTPATIENT PHYSICAL THERAPY NEURO EVALUATION   Patient Name: [name] MRN: [id number] DOB:[birth year], 72 y.o., male Today's Date: 03/29/2023   PCP: Pincus Sanes, MD REFERRING PROVIDER: Pincus Sanes, MD  END OF SESSION:  PT End of Session - 03/29/23 1535     Visit Number 2    Number of Visits 13    Date for PT Re-Evaluation 05/17/23    Authorization Type aetna medicare    PT Start Time 1532    PT Stop Time 1613    PT Time Calculation (min) 41 min    Activity Tolerance Patient limited by pain    Behavior During Therapy [facility] for tasks assessed/performed             Past Medical History:  Diagnosis Date   ALLERGIC RHINITIS    CAD, NATIVE VESSEL    BMS to OM1 2001, DES to BMS 2005   Chronic back pain    herniated disc   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   Chronic kidney disease    Constipation    takes Carafate four times day   DIAB W/UNSPEC COMP TYPE II/UNSPEC TYPE UNCNTRL    ERECTILE DYSFUNCTION    GERD    HYPERLIPIDEMIA-MIXED    HYPERTENSION, BENIGN    LBBB (left bundle branch block) 02/02/2016   MORTON'S NEUROMA, RIGHT    OSA on CPAP 09/09/2020   Peripheral neuropathy    Seasonal allergies    takes Allegra and Benadryl daily prn;uses Flonase daily   SHOULDER PAIN, RIGHT    Snoring 09/09/2020   Stroke, 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, s/p CIR    TIA on medication 02/2012   Unstable angina (HCC) 07/09/2020   Past Surgical History:  Procedure Laterality Date   ANGIOPLASTY     COLONOSCOPY     CORONARY ANGIOPLASTY  2005   2 stents   CORONARY STENT INTERVENTION N/A 07/10/2020   Procedure: CORONARY STENT INTERVENTION;  Surgeon: Swaziland, Peter M, MD;  Location: [facility] INVASIVE CV LAB;  Service: Cardiovascular;  Laterality: N/A;   DENTAL SURGERY     LARYNGOPLASTY  08/07/2012   Procedure: LARYNGOPLASTY;  Surgeon: Serena Colonel, MD;  Location: [facility] OR;  Service: ENT;  Laterality: Left;  Left Vocal Cord  Medialyzation   LEFT HEART CATH AND CORONARY ANGIOGRAPHY N/A 07/10/2020   Procedure: LEFT HEART CATH AND CORONARY ANGIOGRAPHY;  Surgeon: Swaziland, Peter M, MD;  Location: [facility] INVASIVE CV LAB;  Service: Cardiovascular;  Laterality: N/A;   left knee surgury     x 2    stent  2001, 2004   coronary stents   Patient Active Problem List   Diagnosis Date Noted   Back pain 03/04/2023   Diarrhea 06/09/2022   Ganglion cyst of wrist, left 12/14/2021   Viral gastroenteritis 12/14/2021   Urinary frequency 11/06/2021   Coronary artery disease 03/25/2021   Hoarse 03/25/2021   Major depressive disorder, recurrent episode, moderate (HCC) 03/25/2021   Major depressive disorder, single episode 03/25/2021   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   Observation and evaluation for other specified suspected conditions 03/25/2021   Vocal cord paralysis 03/25/2021   Diabetic peripheral neuropathy (HCC) 12/25/2020   Orthopnea 12/16/2020   OSA on CPAP 09/09/2020   Unstable angina (HCC) 07/09/2020   Sciatica of left side 07/11/2019   Cough 07/11/2019   Edema leg 10/26/2018   History of stroke 10/26/2018   DOE (dyspnea on exertion) 10/26/2018   Left hip pain 07/14/2018  Acute left ankle pain 07/14/2018   Chest pain 07/14/2018   Acute non-recurrent ethmoidal sinusitis 03/13/2018   Nonintractable headache 02/24/2018   CRI (chronic renal insufficiency), stage 3 (moderate) 02/22/2017   Anemia 02/16/2017   Lower respiratory infection 02/16/2017   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   LBBB (left bundle branch block) 02/02/2016   Nonallopathic lesion of lumbosacral region 12/11/2014   Nonallopathic lesion of sacral region 12/11/2014   Nonallopathic lesion of thoracic region 12/11/2014   Arthritis of left hip 11/20/2014   Ischial bursitis of left side 10/28/2014   Hamstring tightness of left lower extremity 09/16/2014   Piriformis syndrome of left side 09/16/2014   Dysphonia  06/22/2013   Hemiparesis affecting left side as late effect of stroke (HCC) 10/10/2012   Dysphagia following cerebrovascular accident 08/14/2012   Chronic back pain    Peripheral neuropathy (HCC)    TIA (transient ischemic attack) 04/08/2012   Stroke (HCC) 02/21/2012   MORTON'S NEUROMA, RIGHT 05/29/2010   Poorly controlled type 2 diabetes mellitus with circulatory disorder (HCC) 04/13/2010   ALLERGIC RHINITIS 04/13/2010   GERD 04/13/2010   ERECTILE DYSFUNCTION 03/12/2009   Essential hypertension 03/12/2009   CAD S/P percutaneous coronary angioplasty 03/12/2009   Hyperlipidemia 03/07/2009    ONSET DATE: 03/04/2023 referral  REFERRING DIAG: M54.9 (ICD-10-CM) - Back pain, unspecified back location, unspecified back pain laterality, unspecified chronicity I69.354 (ICD-10-CM) - Hemiparesis affecting left side as late effect of stroke  THERAPY DIAG:  Abnormal posture  Difficulty in walking, not elsewhere classified  Muscle weakness (generalized)  Other abnormalities of gait and mobility  Unsteadiness on feet  Other lack of coordination  Rationale for Evaluation and Treatment: Rehabilitation  SUBJECTIVE:                                                                                                                                                                                             SUBJECTIVE STATEMENT: Patient arrives to clinic alone, using SPC. Reports that stools are less dark and his ability to control bladder has improved slightly. Denies fevers, but states he has profuse night time sweating at times.  Pt accompanied by: self  PERTINENT HISTORY: fall in 1997, CVA 2013  PAIN:  Are you having pain? Yes: NPRS scale: 8-9/10 Pain location: mid/low back radiating around RLQ/LLQ  Pain description: dull, sharp, tingling on L low back/buttock Aggravating factors: standing up, walking Relieving factors: laying prone  PRECAUTIONS: Fall and Other: L  hemiparesis   PATIENT GOALS: "strengthen my body"  OBJECTIVE:   DIAGNOSTIC FINDINGS: Lumbar xray still pending at time of eval  TODAY'S TREATMENT:                                                                                                                              -  scifit hills level 2 B LE only for improved functional strength   -needed a break after 5 mins due to increase in LLQ pain  -attempted to have patient complete HEP (below), but patient with significant increase in pain even with benign movements in very small ROM (thus patient was NOT provided with HEP at this time)   PATIENT EDUCATION: Education details: initial HEP not provided until medical imaging resulted Person educated: Patient Education method: Explanation Education comprehension: verbalized understanding and needs further education  HOME EXERCISE PROGRAM: Access Code: [email or web address] URL: [email or web address] Date: 03/29/2023 Prepared by: Merry Lofty  Exercises - Supine Bridge  - 1 x daily - 7 x weekly - 3 sets - 10 reps - Supine Lower Trunk Rotation  - 1 x daily - 7 x weekly - 3 sets - 10 reps - Supine Posterior Pelvic Tilt  - 1 x daily - 7 x weekly - 3 sets - 10 reps - Supine March  - 1 x daily - 7 x weekly - 3 sets - 10 reps  GOALS: Goals reviewed with patient? Yes  SHORT TERM GOALS: Target date: 04/15/23  Pt will be independent with initial HEP for improved functional strength and reduced pain  Baseline: to be provided Goal status: INITIAL  2.  Patient will improve Oswestry score to </= 22 to indicate a reduction in disability due to LBP Baseline: 26 (severe) Goal status: INITIAL  3.  5x STS Baseline: to be assessed when medically cleared by imaging Goal status: INITIAL  4.  TUG goal Baseline: to be assessed when medically cleared by imaging Goal status: INITIAL   LONG TERM GOALS: Target date: 05/17/23  Pt will be independent with initial HEP for improved functional  strength and reduced pain  Baseline: to be provided Goal status: INITIAL  Patient will improve Oswestry score to </= 22 to indicate a reduction in disability due to LBP Baseline: 26 (severe) Goal status: INITIAL  5x STS Baseline: to be assessed when medically cleared by imaging Goal status: INITIAL  TUG goal Baseline: to be assessed when medically cleared by imaging Goal status: INITIAL   ASSESSMENT:  CLINICAL IMPRESSION: Patient seen for skilled PT session with emphasis on attempting to initiate HEP. At the time of this session, lumbar MRI was not resulted. He does continue to report significant LBP that radiates to the anterior abdomen. He was unable to tolerate even minimal AROM for benign movements (posterior pelvic tilt, for example). And thus, PT did not provide patient with print out of HEP to complete at home at this time. Continue POC as able.   OBJECTIVE IMPAIRMENTS: Abnormal gait, decreased activity tolerance, decreased coordination, decreased endurance, decreased knowledge of condition, decreased mobility, difficulty walking, decreased ROM, decreased strength, increased muscle spasms, impaired tone, impaired UE functional use, improper body mechanics, postural dysfunction, and pain.   ACTIVITY LIMITATIONS: carrying, lifting, bending, standing, squatting, sleeping, stairs, bed mobility, bathing, hygiene/grooming, locomotion level, and caring for others  PARTICIPATION LIMITATIONS: meal prep, cleaning, laundry, interpersonal relationship, driving, shopping, community activity, and yard work  PERSONAL FACTORS: Age, Past/current experiences, Time since onset of injury/illness/exacerbation, Transportation, and 3+ comorbidities: see above  are also affecting patient's functional outcome.   REHAB POTENTIAL: Fair time since onset, unknown reason for exacerbation  CLINICAL DECISION MAKING: Evolving/moderate complexity  EVALUATION COMPLEXITY: Moderate  PLAN:  PT FREQUENCY:  2x/week  PT DURATION: 6 weeks  PLANNED INTERVENTIONS: Therapeutic exercises, Therapeutic activity, Neuromuscular re-education, Balance training, Gait training, Patient/Family education, Self Care, Joint mobilization, Stair  training, Vestibular training, Visual/preceptual remediation/compensation, Orthotic/Fit training, DME instructions, Aquatic Therapy, Electrical stimulation, Spinal mobilization, Cryotherapy, Moist heat, Taping, Manual therapy, and Re-evaluation  PLAN FOR NEXT SESSION: results from imaging? TUG, 5xSTS, HEP   Westley Foots, PT, DPT, CBIS 03/29/2023, 4:17 PM

## 2023-03-30 ENCOUNTER — Telehealth: Payer: Self-pay

## 2023-03-30 ENCOUNTER — Ambulatory Visit: Payer: Medicare HMO | Admitting: Internal Medicine

## 2023-03-30 ENCOUNTER — Ambulatory Visit (INDEPENDENT_AMBULATORY_CARE_PROVIDER_SITE_OTHER): Payer: Medicare HMO | Admitting: Gastroenterology

## 2023-03-30 ENCOUNTER — Encounter: Payer: Self-pay | Admitting: Gastroenterology

## 2023-03-30 ENCOUNTER — Other Ambulatory Visit (INDEPENDENT_AMBULATORY_CARE_PROVIDER_SITE_OTHER): Payer: Medicare HMO

## 2023-03-30 VITALS — BP 140/78 | HR 68 | Ht 71.0 in | Wt 211.0 lb

## 2023-03-30 DIAGNOSIS — D649 Anemia, unspecified: Secondary | ICD-10-CM

## 2023-03-30 DIAGNOSIS — R1084 Generalized abdominal pain: Secondary | ICD-10-CM | POA: Diagnosis not present

## 2023-03-30 DIAGNOSIS — R194 Change in bowel habit: Secondary | ICD-10-CM

## 2023-03-30 DIAGNOSIS — Z7902 Long term (current) use of antithrombotics/antiplatelets: Secondary | ICD-10-CM

## 2023-03-30 LAB — FOLATE: Folate: 11.3 ng/mL (ref >5.9)

## 2023-03-30 LAB — CBC WITH DIFFERENTIAL/PLATELET
Basophils Absolute: 0.1 10*3/uL (ref 0.0–0.1)
Basophils Relative: 0.6 % (ref 0.0–3.0)
Eosinophils Absolute: 0.3 10*3/uL (ref 0.0–0.7)
Eosinophils Relative: 3.7 % (ref 0.0–5.0)
HCT: 33 % — ABNORMAL LOW (ref 39.0–52.0)
Hemoglobin: 11.2 g/dL — ABNORMAL LOW (ref 13.0–17.0)
Lymphocytes Relative: 27.7 % (ref 12.0–46.0)
Lymphs Abs: 2.4 10*3/uL (ref 0.7–4.0)
MCHC: 33.9 g/dL (ref 30.0–36.0)
MCV: 92.5 fl (ref 78.0–100.0)
Monocytes Absolute: 0.8 10*3/uL (ref 0.1–1.0)
Monocytes Relative: 9.2 % (ref 3.0–12.0)
Neutro Abs: 5.1 10*3/uL (ref 1.4–7.7)
Neutrophils Relative %: 58.8 % (ref 43.0–77.0)
Platelets: 185 10*3/uL (ref 150.0–400.0)
RBC: 3.57 Mil/uL — ABNORMAL LOW (ref 4.22–5.81)
RDW: 12.8 % (ref 11.5–15.5)
WBC: 8.6 10*3/uL (ref 4.0–10.5)

## 2023-03-30 LAB — IBC + FERRITIN
Ferritin: 145.3 ng/mL (ref 22.0–322.0)
Iron: 80 ug/dL (ref 42–165)
Saturation Ratios: 35.5 % (ref 20.0–50.0)
TIBC: 225.4 ug/dL — ABNORMAL LOW (ref 250.0–450.0)
Transferrin: 161 mg/dL — ABNORMAL LOW (ref 212.0–360.0)

## 2023-03-30 LAB — VITAMIN B12: Vitamin B-12: 329 pg/mL (ref 211–911)

## 2023-03-30 NOTE — Progress Notes (Signed)
[facility] Consult Note:  History: [name] 03/30/2023  Referring provider: Pincus Sanes, MD  Reason for consult/chief complaint: Anemia (Pt states he was referred by his primary DR about he anemia level.)   Subjective  HPI: Mr. Purnell was referred by primary care for evaluation of chronic normocytic anemia.  Based on labs noted below, this has been going on for years.  When he last saw cardiology he had noted a period of "dark stools" and some irregularity of bowel habits, so it was felt this anemia should be addressed in case there was a GI cause of it. He actually tells me that for at least the last 2 months he has had abdominal pain primarily in the left upper quadrant/left flank that feels as if his intestines are pushing on that area.  This pain has then gradually migrated to other areas and now is fairly generalized.  It does not seem to have any clear relation to eating or bowel movements.  He no longer has dark stool but his BMs are semiformed to loose more often than not.  No bright red blood per rectum.  While he had some recent lumbar imaging for back pain, he has not had abdominal imaging since onset of the symptoms.  Mr. Wecker describes dysphagia going on for years, and it sounds like food or liquid often feels stuck in the throat.  Prior reports show that he had a vocal cord paralysis for which she underwent a procedure by ENT noted below, shortly after which he had a stroke.  The only endoscopic report on file in this EMR is a colonoscopy from October 2005 done at [facility], reportedly normal exam. ROS:  Review of Systems  Constitutional:  Negative for appetite change and unexpected weight change.  HENT:  Positive for voice change. Negative for mouth sores.        Chronic hoarseness since 2013  Eyes:  Negative for pain and redness.  Respiratory:  Negative for cough and shortness of breath.   Cardiovascular:  Negative for chest  pain and palpitations.  Genitourinary:  Negative for dysuria and hematuria.  Musculoskeletal:  Negative for arthralgias and myalgias.  Skin:  Negative for pallor and rash.  Neurological:  Positive for weakness. Negative for headaches.       Left-sided weakness since his CVA  Hematological:  Negative for adenopathy.     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   Chronic kidney disease    Constipation    takes Carafate four times day   DIAB W/UNSPEC COMP TYPE II/UNSPEC TYPE UNCNTRL    ERECTILE DYSFUNCTION    GERD    HYPERLIPIDEMIA-MIXED    HYPERTENSION, BENIGN    LBBB (left bundle branch block) 02/02/2016   MORTON'S NEUROMA, RIGHT    OSA on CPAP 09/09/2020   Peripheral neuropathy    Seasonal allergies    takes Allegra and Benadryl daily prn;uses Flonase daily   SHOULDER PAIN, RIGHT    Snoring 09/09/2020   Stroke, 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 In that comprehensive note, patient was also reporting intermittent abdominal discomfort with "dark stool" and feelings of incomplete evacuation after BMs.  Past Surgical History: Past Surgical History:  Procedure Laterality Date   ANGIOPLASTY     COLONOSCOPY     CORONARY ANGIOPLASTY  2005   2 stents   CORONARY STENT INTERVENTION N/A 07/10/2020   Procedure: CORONARY STENT INTERVENTION;  Surgeon: Swaziland, Peter M, MD;  Location: [facility] INVASIVE CV LAB;  Service: Cardiovascular;  Laterality: N/A;   DENTAL SURGERY     LARYNGOPLASTY  08/07/2012   Procedure: LARYNGOPLASTY;  Surgeon: Serena Colonel, MD;  Location: [facility] OR;  Service:  ENT;  Laterality: Left;  Left Vocal Cord Medialyzation   LEFT HEART CATH AND CORONARY ANGIOGRAPHY N/A 07/10/2020   Procedure: LEFT HEART CATH AND CORONARY ANGIOGRAPHY;  Surgeon: Swaziland, Peter M, MD;  Location: [facility] INVASIVE CV LAB;  Service: Cardiovascular;  Laterality: N/A;   left knee surgury     x 2    stent  2001, 2004   coronary stents   Operative note from September 2013 by Dr. Brynda Peon reviewed-laryngoplasty for vocal cord paralysis  Family History: Family History  Problem Relation Age of Onset   Cancer Mother    Liver cancer Brother    Colon cancer Neg Hx    Esophageal cancer Neg Hx     Social History: Social History   Socioeconomic History   Marital status: Legally Separated    Spouse name: Not on file   Number of children: 3   Years of education: college   Highest education level: Not on file  Occupational History   Occupation: disabled    Employer: [employer]  Tobacco Use   Smoking status: Former    Packs/day: 2.50    Years: 9.00    Additional pack years: 0.00    Total pack years: 22.50    Types: Cigarettes    Quit date: 02/24/1983    Years since quitting: 40.1   Smokeless tobacco: Never  Vaping Use   Vaping Use: Never used  Substance and Sexual Activity   Alcohol use: No    Alcohol/week: 0.0 standard drinks of alcohol   Drug use: No   Sexual activity: Never  Other Topics Concern   Not on file  Social History Narrative      Social Determinants of Health   Financial Resource Strain: Low Risk  (03/05/2022)   Overall Financial Resource Strain (CARDIA)    Difficulty of Paying Living Expenses: Not hard at all  Food Insecurity: No Food Insecurity (03/05/2022)   Hunger Vital Sign    Worried About Running Out of Food in the Last Year: Never true    Ran Out of Food in the Last Year: Never true  Transportation Needs: No Transportation Needs (03/05/2022)   PRAPARE - Administrator, Civil Service (Medical): No    Lack of Transportation  (Non-Medical): No  Physical Activity: Sufficiently Active (03/05/2022)   Exercise Vital Sign    Days of Exercise per Week: 3 days    Minutes of Exercise per Session: 60 min  Stress: No Stress Concern Present (03/05/2022)   [employer] of Occupational Health - Occupational Stress Questionnaire    Feeling of Stress : Not at all  Social Connections: Socially Isolated (03/05/2022)   Social Connection and Isolation Panel [NHANES]    Frequency of Communication with Friends and Family: Three times a week    Frequency of Social Gatherings with Friends  and Family: Three times a week    Attends Religious Services: Never    Active Member of Clubs or Organizations: No    Attends Banker Meetings: Never    Marital Status: Separated    Allergies: Allergies  Allergen Reactions   Penicillins Anaphylaxis   Pneumococcal Vaccines Anaphylaxis   Shellfish Allergy Anaphylaxis   Gabapentin     Urinary retention   Influenza Vaccines    Lisinopril Cough   Sildenafil Other (See Comments)    Other reaction(s): Headache, Dizziness   Topiramate Other (See Comments)    Chest spasms and numbness   Zofran [Ondansetron]    Amlodipine Other (See Comments)    LE edema   Levitra [Vardenafil] Other (See Comments)    headaches   Metformin Nausea And Vomiting   Tadalafil Other (See Comments)    dizziness    Outpatient Meds: Current Outpatient Medications  Medication Sig Dispense Refill   aspirin EC 81 MG tablet Take 1 tablet (81 mg total) by mouth daily. Swallow whole. 30 tablet 11   atorvastatin (LIPITOR) 40 MG tablet Take 40 mg by mouth daily.     Blood Pressure Monitoring (BLOOD PRESSURE CUFF) MISC 1 Units by Does not apply route daily. 1 each 0   calcium carbonate (TUMS - DOSED IN MG ELEMENTAL CALCIUM) 500 MG chewable tablet Chew 1 tablet by mouth daily as needed for indigestion or heartburn.     carvedilol (COREG) 25 MG tablet Take 1 tablet (25 mg total) by mouth 2 (two) times daily.  180 tablet 3   chlorhexidine (PERIDEX) 0.12 % solution Use as directed 15 mLs in the mouth or throat 2 (two) times daily.     chlorthalidone (HYGROTON) 25 MG tablet TAKE 1 TABLET EVERY DAY 90 tablet 3   clopidogrel (PLAVIX) 75 MG tablet TAKE 1 TABLET EVERY DAY. NEED APPOINTMENT FOR FURTHER REFILLS 90 tablet 3   Continuous Blood Gluc Sensor (FREESTYLE LIBRE 2 SENSOR) MISC 1 Device by Does not apply route every 14 (fourteen) days. 6 each 3   empagliflozin (JARDIANCE) 25 MG TABS tablet Take 1 tablet (25 mg total) by mouth daily before breakfast. 90 tablet 3   furosemide (LASIX) 40 MG tablet TAKE 1 TABLET (40 MG TOTAL) BY MOUTH DAILY. NEED APPT. 90 tablet 3   glucose blood (ACCU-CHEK AVIVA PLUS) test strip uad tid 100 each 12   Insulin Lispro Prot & Lispro (HUMALOG MIX 50/50 KWIKPEN) (50-50) 100 UNIT/ML Kwikpen Inject 24 Units into the skin daily with breakfast. 15 mL 1   Insulin Pen Needle 32G X 4 MM MISC Use 1x a day 100 each 3   losartan (COZAAR) 100 MG tablet TAKE 1 TABLET EVERY DAY 90 tablet 3   Microlet Lancets MISC UAD to check sugars TID.  E11.22 270 each 3   nitroGLYCERIN (NITROSTAT) 0.4 MG SL tablet Place 1 tablet (0.4 mg total) under the tongue every 5 (five) minutes as needed for chest pain (up to 3 doses). 25 tablet 3   senna-docusate (SENOKOT-S) 8.6-50 MG per tablet Take 2 tablets by mouth 2 (two) times daily. For constipation.     tiZANidine (ZANAFLEX) 2 MG tablet Take 1 tablet (2 mg total) by mouth at bedtime as needed for muscle spasms. 30 tablet 0   Current Facility-Administered Medications  Medication Dose Route Frequency Provider Last Rate Last Admin   sodium chloride flush (NS) 0.9 % injection 3 mL  3 mL Intravenous Q12H Chilton Si, MD  ___________________________________________________________________ Objective   Exam:  BP (!) 140/78   Pulse 68   Ht 5\' 11"  (1.803 m)   Wt 211 lb (95.7 kg)   BMI 29.43 kg/m  Wt Readings from Last 3 Encounters:   03/30/23 211 lb (95.7 kg)  03/08/23 208 lb (94.3 kg)  03/04/23 214 lb (97.1 kg)    General: Chronically ill-appearing man, left hemiparesis.  Ambulates with a cane, gets on exam table slowly but without assistance. Eyes: sclera anicteric, no redness ENT: oral mucosa moist without lesions, no cervical or supraclavicular lymphadenopathy.  Hoarse CV: Regular with soft systolic murmur, no JVD, no peripheral edema Resp: clear to auscultation bilaterally, normal RR and effort noted GI: soft, multifocal tenderness, with active bowel sounds. No guarding or palpable organomegaly noted. Skin; warm and dry, no rash or jaundice noted Neuro: Left arm and leg weakness  Labs:     Latest Ref Rng & Units 01/25/2023   11:20 AM 12/24/2022    8:12 AM 12/08/2021   10:25 AM  CBC  WBC 3.4 - 10.8 x10E3/uL 6.7  8.2  5.8   Hemoglobin 13.0 - 17.7 g/dL 11.9  14.7  82.9   Hematocrit 37.5 - 51.0 % 34.2  30.9  34.7   Platelets 150 - 450 x10E3/uL 188  161  178    Chronic anemia with hemoglobin 10-12 dating back to 2017     Latest Ref Rng & Units 01/25/2023   11:20 AM 12/24/2022    8:12 AM 12/08/2021   12:51 PM  CMP  Glucose 70 - 99 mg/dL 562  130    BUN 8 - 27 mg/dL 32  34    Creatinine 8.65 - 1.27 mg/dL 7.84  6.96    Sodium 295 - 144 mmol/L 138  138    Potassium 3.5 - 5.2 mmol/L 4.6  4.0    Chloride 96 - 106 mmol/L 103  102    CO2 20 - 29 mmol/L 21  22    Calcium 8.6 - 10.2 mg/dL 9.8  9.5    Total Protein 6.0 - 8.5 g/dL  7.2  7.4   Total Bilirubin 0.0 - 1.2 mg/dL  0.8  1.1   Alkaline Phos 44 - 121 IU/L  73  54   AST 0 - 40 IU/L  17  29   ALT 0 - 44 IU/L  15  29    Last iron studies were normal in March 2018  No vitamin B12 or folic acid levels on file  February 2024 nuclear stress test without ischemia.    Other:  Echocardiogram March 2024  1. Left ventricular ejection fraction, by estimation, is 55%. The left  ventricle has normal function. The left ventricle has no regional wall  motion  abnormalities. There is mild asymmetric left ventricular  hypertrophy of the basal-septal segment. Left  ventricular diastolic parameters are consistent with Grade I diastolic  dysfunction (impaired relaxation). There is septal-lateral dyssynchrony  secondary to LBBB.   2. Right ventricular systolic function is normal. The right ventricular  size is normal. There is normal pulmonary artery systolic pressure. The  estimated right ventricular systolic pressure is 22.2 mmHg.   3. The mitral valve is normal in structure. Trivial mitral valve  regurgitation.   4. The aortic valve is tricuspid. There is mild calcification of the  aortic valve. There is mild thickening of the aortic valve. Aortic valve  regurgitation is not visualized.   5. The inferior vena cava is normal in size with  greater than 50%  respiratory variability, suggesting right atrial pressure of 3 mmHg.   Comparison(s): No significant change from prior study.   Assessment: Encounter Diagnoses  Name Primary?   Normocytic anemia Yes   Generalized abdominal pain    Altered bowel habits    Long term (current) use of antithrombotics/antiplatelets     Longstanding normocytic anemia that is most likely from CKD.  However, he has more recently developed fairly generalized abdominal pain and altered bowel habits of unclear cause.  Not clear whether or not this is at all related to the anemia.  He reportedly had some "dark stools", but this was also not definitely GI bleeding.  Source of GI blood loss should be considered, particularly in a patient on long-term antiplatelet therapy.   Plan:  Upper endoscopy and colonoscopy scheduled.  He was agreeable after discussion of procedure and risks.  The benefits and risks of the planned procedure were described in detail with the patient or (when appropriate) their health care proxy.  Risks were outlined as including, but not limited to, bleeding, infection, perforation, adverse medication  reaction leading to cardiac or pulmonary decompensation, pancreatitis (if ERCP).  The limitation of incomplete mucosal visualization was also discussed.  No guarantees or warranties were given.  He will remain on aspirin but will need to hold Plavix 5 days before the procedure.  His cardiologist will be contacted about this, I expect they will most likely be agreeable given how long it has been since any vascular intervention for him.  Patient at increased risk for cardiopulmonary complications of procedure due to medical comorbidities.   CT scan of the abdomen and pelvis with oral contrast only (no IV contrast due to CKD).  Plans for endoscopic testing potentially change based on these results.  Regarding the anemia, labs today: CBC, iron studies, B12 and folic acid Based on these results, primary care can consider referral to nephrology if not previously done and utility of any needed supplementation and/or erythropoietin treatment.  Thank you for the courtesy of this consult.  Please call me with any questions or concerns.  Charlie Pitter III  CC: Referring provider noted above

## 2023-03-30 NOTE — Patient Instructions (Signed)
_______________________________________________________  If your blood pressure at your visit was 140/90 or greater, please contact your primary care physician to follow up on this.  _______________________________________________________  If you are age 74 or older, your body mass index should be between 23-30. Your Body mass index is 29.43 kg/m. If this is out of the aforementioned range listed, please consider follow up with your Primary Care Provider.  If you are age 34 or younger, your body mass index should be between 19-25. Your Body mass index is 29.43 kg/m. If this is out of the aformentioned range listed, please consider follow up with your Primary Care Provider.   ________________________________________________________  The [facility] providers would like to encourage you to use [facility] to communicate with providers for non-urgent requests or questions.  Due to long hold times on the telephone, sending your provider a message by [facility] may be a faster and more efficient way to get a response.  Please allow 48 business hours for a response.  Please remember that this is for non-urgent requests.   Your provider has requested that you go to the basement level for lab work before leaving today. Press "B" on the elevator. The lab is located at the first door on the left as you exit the elevator.  _______________________________________________________  Your provider has requested that you go to the basement level for lab work before leaving today. Press "B" on the elevator. The lab is located at the first door on the left as you exit the elevator.  You have been scheduled for a CT scan of the abdomen and pelvis at [facility], 1st floor Radiology. You are scheduled on 04/20/2023 at 10:30am. You should arrive at 9:00am  1) Do not eat anything after 6:30am (4 hours prior to your test)    You may take any medications as prescribed with a small amount of water, if necessary. If  you take any of the following medications: METFORMIN, GLUCOPHAGE, GLUCOVANCE, AVANDAMET, RIOMET, FORTAMET, ACTOPLUS MET, JANUMET, GLUMETZA or METAGLIP, you MAY be asked to HOLD this medication 48 hours AFTER the exam.   n the type of exam you are having performed.   If you have any questions regarding your exam or if you need to reschedule, you may call [facility] at [phone] between the hours of 8:00 am and 5:00 pm, Monday-Friday.   You have been scheduled for an endoscopy and colonoscopy. Please follow the written instructions given to you at your visit today. Please pick up your prep supplies at the pharmacy within the next 1-3 days. If you use inhalers (even only as needed), please bring them with you on the day of your procedure.

## 2023-03-30 NOTE — Telephone Encounter (Signed)
[facility] Pre-operative Risk Assessment     Request for surgical clearance:     Endoscopy Procedure  What type of surgery is being performed?     Endo/colon  When is this surgery scheduled?     04/26/2023  What type of clearance is required ?   Pharmacy  Are there any medications that need to be held prior to surgery and how long? Plavix - 5 days  Practice name and name of physician performing surgery?      [facility]  What is your office phone and fax number?      Phone- [phone]  Fax- [phone]  Anesthesia type (None, local, MAC, general) ?       MAC

## 2023-03-30 NOTE — Telephone Encounter (Signed)
Luther Parody,   You saw this patient on 01/25/2023. Will you please comment on medical clearance for endoscopy/colonoscopy?  Please route your response to P CV DIV Preop. I will communicate with requesting office once you have given recommendations.   Thank you!  Carlos Levering, NP

## 2023-03-31 ENCOUNTER — Ambulatory Visit: Payer: Medicare HMO

## 2023-03-31 DIAGNOSIS — R293 Abnormal posture: Secondary | ICD-10-CM

## 2023-03-31 DIAGNOSIS — M6281 Muscle weakness (generalized): Secondary | ICD-10-CM

## 2023-03-31 DIAGNOSIS — R2689 Other abnormalities of gait and mobility: Secondary | ICD-10-CM

## 2023-03-31 DIAGNOSIS — R2681 Unsteadiness on feet: Secondary | ICD-10-CM

## 2023-03-31 DIAGNOSIS — R262 Difficulty in walking, not elsewhere classified: Secondary | ICD-10-CM

## 2023-03-31 DIAGNOSIS — R278 Other lack of coordination: Secondary | ICD-10-CM

## 2023-03-31 NOTE — Therapy (Signed)
OUTPATIENT PHYSICAL THERAPY NEURO TREATMENT   Patient Name: [name] MRN: [id number] DOB:[birth year], 72 y.o., male Today's Date: 03/31/2023   PCP: Pincus Sanes, MD REFERRING PROVIDER: Pincus Sanes, MD  END OF SESSION:  PT End of Session - 03/31/23 1506     Visit Number 3    Number of Visits 13    Date for PT Re-Evaluation 05/17/23    Authorization Type [employer]etna medicare    PT Start Time 1514   agreeable to start early   PT Stop Time 1607    PT Time Calculation (min) 53 min    Activity Tolerance Patient limited by pain    Behavior During Therapy [facility] for tasks assessed/performed             Past Medical History:  Diagnosis Date   ALLERGIC RHINITIS    CAD, NATIVE VESSEL    BMS to OM1 2001, DES to BMS 2005   Chronic back pain    herniated disc   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   Chronic kidney disease    Constipation    takes Carafate four times day   DIAB W/UNSPEC COMP TYPE II/UNSPEC TYPE UNCNTRL    ERECTILE DYSFUNCTION    GERD    HYPERLIPIDEMIA-MIXED    HYPERTENSION, BENIGN    LBBB (left bundle branch block) 02/02/2016   MORTON'S NEUROMA, RIGHT    OSA on CPAP 09/09/2020   Peripheral neuropathy    Seasonal allergies    takes Allegra and Benadryl daily prn;uses Flonase daily   SHOULDER PAIN, RIGHT    Snoring 09/09/2020   Stroke, 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, s/p CIR    TIA on medication 02/2012   Unstable angina (HCC) 07/09/2020   Past Surgical History:  Procedure Laterality Date   ANGIOPLASTY     COLONOSCOPY     CORONARY ANGIOPLASTY  2005   2 stents   CORONARY STENT INTERVENTION N/A 07/10/2020   Procedure: CORONARY STENT INTERVENTION;  Surgeon: Swaziland, Peter M, MD;  Location: [facility] INVASIVE CV LAB;  Service: Cardiovascular;  Laterality: N/A;   DENTAL SURGERY     LARYNGOPLASTY  08/07/2012   Procedure: LARYNGOPLASTY;  Surgeon: Serena Colonel, MD;  Location: [facility] OR;  Service: ENT;  Laterality:  Left;  Left Vocal Cord Medialyzation   LEFT HEART CATH AND CORONARY ANGIOGRAPHY N/A 07/10/2020   Procedure: LEFT HEART CATH AND CORONARY ANGIOGRAPHY;  Surgeon: Swaziland, Peter M, MD;  Location: [facility] INVASIVE CV LAB;  Service: Cardiovascular;  Laterality: N/A;   left knee surgury     x 2    stent  2001, 2004   coronary stents   Patient Active Problem List   Diagnosis Date Noted   Back pain 03/04/2023   Diarrhea 06/09/2022   Ganglion cyst of wrist, left 12/14/2021   Viral gastroenteritis 12/14/2021   Urinary frequency 11/06/2021   Coronary artery disease 03/25/2021   Hoarse 03/25/2021   Major depressive disorder, recurrent episode, moderate (HCC) 03/25/2021   Major depressive disorder, single episode 03/25/2021   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   Observation and evaluation for other specified suspected conditions 03/25/2021   Vocal cord paralysis 03/25/2021   Diabetic peripheral neuropathy (HCC) 12/25/2020   Orthopnea 12/16/2020   OSA on CPAP 09/09/2020   Unstable angina (HCC) 07/09/2020   Sciatica of left side 07/11/2019   Cough 07/11/2019   Edema leg 10/26/2018   History of stroke 10/26/2018   DOE (dyspnea on exertion) 10/26/2018  Left hip pain 07/14/2018   Acute left ankle pain 07/14/2018   Chest pain 07/14/2018   Acute non-recurrent ethmoidal sinusitis 03/13/2018   Nonintractable headache 02/24/2018   CRI (chronic renal insufficiency), stage 3 (moderate) 02/22/2017   Anemia 02/16/2017   Lower respiratory infection 02/16/2017   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   LBBB (left bundle branch block) 02/02/2016   Nonallopathic lesion of lumbosacral region 12/11/2014   Nonallopathic lesion of sacral region 12/11/2014   Nonallopathic lesion of thoracic region 12/11/2014   Arthritis of left hip 11/20/2014   Ischial bursitis of left side 10/28/2014   Hamstring tightness of left lower extremity 09/16/2014   Piriformis syndrome of left side  09/16/2014   Dysphonia 06/22/2013   Hemiparesis affecting left side as late effect of stroke (HCC) 10/10/2012   Dysphagia following cerebrovascular accident 08/14/2012   Chronic back pain    Peripheral neuropathy (HCC)    TIA (transient ischemic attack) 04/08/2012   Stroke (HCC) 02/21/2012   MORTON'S NEUROMA, RIGHT 05/29/2010   Poorly controlled type 2 diabetes mellitus with circulatory disorder (HCC) 04/13/2010   ALLERGIC RHINITIS 04/13/2010   GERD 04/13/2010   ERECTILE DYSFUNCTION 03/12/2009   Essential hypertension 03/12/2009   CAD S/P percutaneous coronary angioplasty 03/12/2009   Hyperlipidemia 03/07/2009    ONSET DATE: 03/04/2023 referral  REFERRING DIAG: M54.9 (ICD-10-CM) - Back pain, unspecified back location, unspecified back pain laterality, unspecified chronicity I69.354 (ICD-10-CM) - Hemiparesis affecting left side as late effect of stroke  THERAPY DIAG:  Abnormal posture  Difficulty in walking, not elsewhere classified  Muscle weakness (generalized)  Other abnormalities of gait and mobility  Unsteadiness on feet  Other lack of coordination  Rationale for Evaluation and Treatment: Rehabilitation  SUBJECTIVE:                                                                                                                                                                                             SUBJECTIVE STATEMENT: Patient reports doing the same. Still having some dark stools intermittently. Did see GI, who wants to run more abdominal tests/imaging. Denies falls/near falls.  Pt accompanied by: self  PERTINENT HISTORY: fall in 1997, CVA 2013  PAIN:  Are you having pain? Yes: NPRS scale: 8-9/10 Pain location: mid/low back radiating around RLQ/LLQ  Pain description: dull, sharp, tingling on L low back/buttock Aggravating factors: standing up, walking Relieving factors: none reported *reports pain will wake him up at night  PRECAUTIONS: Fall and Other: L  hemiparesis   PATIENT GOALS: "strengthen my body"  OBJECTIVE:   DIAGNOSTIC FINDINGS: Lumbar MRI still pending at this time  TODAY'S TREATMENT:                                                                                                                              -NuStep level 6 B UE/LE x5 mins (unable to tolerate more due to pain)  -anterior ball roll out, B direction ball roll out  -iso core press down on physio ball -supine SKTC -gentle thoracolumbar rotational stretching   -able to tolerate less ROM on L > R -prone prolonged hip flexor stretch  -small ROM prone press up on elbows   PATIENT EDUCATION: Education details: initial HEP to be completed within pain-tolerable ROM  Person educated: Patient Education method: Explanation Education comprehension: verbalized understanding and needs further education  HOME EXERCISE PROGRAM: Access Code: [email or web address] URL: [email or web address] Date: 03/29/2023 Prepared by: Merry Lofty  Exercises - Supine Lower Trunk Rotation  - 1 x daily - 7 x weekly - 3 sets - 10 reps - Supine Posterior Pelvic Tilt  - 1 x daily - 7 x weekly - 3 sets - 10 reps - Prone Press Up On Elbows  - 1 x daily - 7 x weekly - 3 sets - 10 reps  GOALS: Goals reviewed with patient? Yes  SHORT TERM GOALS: Target date: 04/15/23  Pt will be independent with initial HEP for improved functional strength and reduced pain  Baseline: to be provided Goal status: INITIAL  2.  Patient will improve Oswestry score to </= 22 to indicate a reduction in disability due to LBP Baseline: 26 (severe) Goal status: INITIAL  3.  5x STS Baseline: to be assessed when medically cleared by imaging Goal status: INITIAL  4.  TUG goal Baseline: to be assessed when medically cleared by imaging Goal status: INITIAL   LONG TERM GOALS: Target date: 05/17/23  Pt will be independent with initial HEP for improved functional strength and reduced pain  Baseline: to be  provided Goal status: INITIAL  Patient will improve Oswestry score to </= 22 to indicate a reduction in disability due to LBP Baseline: 26 (severe) Goal status: INITIAL  5x STS Baseline: to be assessed when medically cleared by imaging Goal status: INITIAL  TUG goal Baseline: to be assessed when medically cleared by imaging Goal status: INITIAL   ASSESSMENT:  CLINICAL IMPRESSION: Patient seen for skilled PT session with emphasis on gentle low back stretching/mobility. Patient remains severely limited by pain throughout all AROM. He found slight reprieve in prone position. Upon palpation, no overt trigger points or muscular tension. Does report some pinpoint tenderness over L iliac crest. Patient reporting minimal relief in pain after session spent on stretching. Continue POC as able.   OBJECTIVE IMPAIRMENTS: Abnormal gait, decreased activity tolerance, decreased coordination, decreased endurance, decreased knowledge of condition, decreased mobility, difficulty walking, decreased ROM, decreased strength, increased muscle spasms, impaired tone, impaired UE functional use, improper body mechanics, postural dysfunction, and pain.   ACTIVITY LIMITATIONS: carrying, lifting, bending, standing, squatting, sleeping, stairs, bed  mobility, bathing, hygiene/grooming, locomotion level, and caring for others  PARTICIPATION LIMITATIONS: meal prep, cleaning, laundry, interpersonal relationship, driving, shopping, community activity, and yard work  PERSONAL FACTORS: Age, Past/current experiences, Time since onset of injury/illness/exacerbation, Transportation, and 3+ comorbidities: see above  are also affecting patient's functional outcome.   REHAB POTENTIAL: Fair time since onset, unknown reason for exacerbation  CLINICAL DECISION MAKING: Evolving/moderate complexity  EVALUATION COMPLEXITY: Moderate  PLAN:  PT FREQUENCY: 2x/week  PT DURATION: 6 weeks  PLANNED INTERVENTIONS: Therapeutic  exercises, Therapeutic activity, Neuromuscular re-education, Balance training, Gait training, Patient/Family education, Self Care, Joint mobilization, Stair training, Vestibular training, Visual/preceptual remediation/compensation, Orthotic/Fit training, DME instructions, Aquatic Therapy, Electrical stimulation, Spinal mobilization, Cryotherapy, Moist heat, Taping, Manual therapy, and Re-evaluation  PLAN FOR NEXT SESSION: results from imaging? TUG, 5xSTS, HEP   Westley Foots, PT, DPT, CBIS 03/31/2023, 4:11 PM

## 2023-03-31 NOTE — Telephone Encounter (Signed)
Mr. Freudenberg has OV 04/04/23. Recommendations will be provided at that time.   Alver Sorrow, NP

## 2023-04-01 NOTE — Progress Notes (Signed)
Lumbar spine MRI looks pretty normal.  No significant pinched nerve.  This is reassuring.  Plan to proceed with physical therapy as your primary care provider ordered.  It looks like physical therapy is scheduled to end at the very end of May. Recommend scheduling appointment with me in early June to talk about your MRI and to see how you are feeling after physical therapy.

## 2023-04-04 ENCOUNTER — Other Ambulatory Visit: Payer: Self-pay

## 2023-04-04 ENCOUNTER — Encounter (HOSPITAL_BASED_OUTPATIENT_CLINIC_OR_DEPARTMENT_OTHER): Payer: Self-pay | Admitting: Family

## 2023-04-04 ENCOUNTER — Ambulatory Visit (INDEPENDENT_AMBULATORY_CARE_PROVIDER_SITE_OTHER): Payer: Medicare HMO | Admitting: Family

## 2023-04-04 ENCOUNTER — Telehealth: Payer: Self-pay

## 2023-04-04 VITALS — BP 152/84 | HR 66 | Ht 71.0 in | Wt 213.0 lb

## 2023-04-04 DIAGNOSIS — G4733 Obstructive sleep apnea (adult) (pediatric): Secondary | ICD-10-CM

## 2023-04-04 DIAGNOSIS — E785 Hyperlipidemia, unspecified: Secondary | ICD-10-CM | POA: Diagnosis not present

## 2023-04-04 DIAGNOSIS — I25118 Atherosclerotic heart disease of native coronary artery with other forms of angina pectoris: Secondary | ICD-10-CM | POA: Diagnosis not present

## 2023-04-04 DIAGNOSIS — I5042 Chronic combined systolic (congestive) and diastolic (congestive) heart failure: Secondary | ICD-10-CM | POA: Diagnosis not present

## 2023-04-04 DIAGNOSIS — I10 Essential (primary) hypertension: Secondary | ICD-10-CM | POA: Diagnosis not present

## 2023-04-04 DIAGNOSIS — Z8673 Personal history of transient ischemic attack (TIA), and cerebral infarction without residual deficits: Secondary | ICD-10-CM

## 2023-04-04 DIAGNOSIS — Z01818 Encounter for other preprocedural examination: Secondary | ICD-10-CM

## 2023-04-04 MED ORDER — LOSARTAN POTASSIUM 100 MG PO TABS
100.0000 mg | ORAL_TABLET | Freq: Every day | ORAL | 3 refills | Status: DC
Start: 2023-04-04 — End: 2024-01-23

## 2023-04-04 MED ORDER — LOSARTAN POTASSIUM 100 MG PO TABS
100.0000 mg | ORAL_TABLET | Freq: Every day | ORAL | 0 refills | Status: DC
Start: 2023-04-04 — End: 2023-04-04

## 2023-04-04 MED ORDER — PEG 3350-KCL-NA BICARB-NACL 420 G PO SOLR: 4000.0000 mL | Freq: Once | ORAL | 0 refills | Status: AC

## 2023-04-04 NOTE — Progress Notes (Signed)
Office Visit    Patient Name: [name] Date of Encounter: 04/04/2023  PCP:  Pincus Sanes, MD   [facility]  Cardiologist:  Chilton Si, MD  Advanced Practice Provider:  No care team member to display Electrophysiologist:  None      Chief Complaint    [name] is a 72 y.o. male presents today for hypertension follow up   Past Medical History    Past Medical History:  Diagnosis Date   ALLERGIC RHINITIS    CAD, NATIVE VESSEL    BMS to OM1 2001, DES to BMS 2005   Chronic back pain    herniated disc   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   Chronic kidney disease    Constipation    takes Carafate four times day   DIAB W/UNSPEC COMP TYPE II/UNSPEC TYPE UNCNTRL    ERECTILE DYSFUNCTION    GERD    HYPERLIPIDEMIA-MIXED    HYPERTENSION, BENIGN    LBBB (left bundle branch block) 02/02/2016   MORTON'S NEUROMA, RIGHT    OSA on CPAP 09/09/2020   Peripheral neuropathy    Seasonal allergies    takes Allegra and Benadryl daily prn;uses Flonase daily   SHOULDER PAIN, RIGHT    Snoring 09/09/2020   Stroke, 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, s/p CIR    TIA on medication 02/2012   Unstable angina (HCC) 07/09/2020   Past Surgical History:  Procedure Laterality Date   ANGIOPLASTY     COLONOSCOPY     CORONARY ANGIOPLASTY  2005   2 stents   CORONARY STENT INTERVENTION N/A 07/10/2020   Procedure: CORONARY STENT INTERVENTION;  Surgeon: Swaziland, Peter M, MD;  Location: [facility] INVASIVE CV LAB;  Service: Cardiovascular;  Laterality: N/A;   DENTAL SURGERY     LARYNGOPLASTY  08/07/2012   Procedure: LARYNGOPLASTY;  Surgeon: Serena Colonel, MD;  Location: [facility] OR;  Service: ENT;  Laterality: Left;  Left Vocal Cord Medialyzation   LEFT HEART CATH AND CORONARY ANGIOGRAPHY N/A 07/10/2020   Procedure: LEFT HEART CATH AND CORONARY ANGIOGRAPHY;  Surgeon: Swaziland, Peter M, MD;  Location: [facility] INVASIVE CV LAB;   Service: Cardiovascular;  Laterality: N/A;   left knee surgury     x 2    stent  2001, 2004   coronary stents    Allergies  Allergies  Allergen Reactions   Penicillins Anaphylaxis   Pneumococcal Vaccines Anaphylaxis   Shellfish Allergy Anaphylaxis   Gabapentin     Urinary retention   Influenza Vaccines    Lisinopril Cough   Sildenafil Other (See Comments)    Other reaction(s): Headache, Dizziness   Topiramate Other (See Comments)    Chest spasms and numbness   Zofran [Ondansetron]    Amlodipine Other (See Comments)    LE edema   Levitra [Vardenafil] Other (See Comments)    headaches   Metformin Nausea And Vomiting   Tadalafil Other (See Comments)    dizziness    History of Present Illness    [name] is a 72 y.o. male with a hx of HTN, CAD s/p multiple PCI, CVA, DM2, HLD, prior tobacco use last seen 01/25/23  Prior PCI with BMS-OM while living in [place]. He had ISR in 2005 and DES placed. Myoview 02/13/16 for new LBBB with LVEF 39%, global hypkinesis, no ischemia. Echo 03/01/16 LVEF 40-45%, mild LVH, gr2DD, mild hypokinesis mid to apical anteroseptum.   ED visit 06/2020 with two month history  of chest pain but did not stay to be evaluated. Seen in follow up a few days later with HS troponin 124. Underwent LHC 07/10/20 95% stenosis of ostial OM2 just proximal to prior OM stent. DES was successfully placed. Repeat echo 07/2020 LVEF 55%, gr1DD, RA pressure . Referred for sleep study which revealed severe OSA.  Myoview 07/2021 due to increased exertional dyspnea LVEF 42% with anterior and apical hypokinesis. There was fixed inferior and apical defect consistent with prior infarct but no ischemia.   Seen 12/23/22 noting exertional dyspnea. Initial plan for LHC however due to renal function, subsequent myoview revealed prior infarct but no new ischemia. CBC revealed anemia recommended for repeat CBC in one week.  Seen 01/25/23 .  For simplification of medication regimen  amlodipine-atorvastatin prescribed as combination pill.  However he later noted allergy to amlodipine despite being on his prior medication list and he was transition to atorvastatin only.  Carvedilol simplified to 25 mg twice daily.  Due to persistent anemia he was referred to GI. Echo 02/15/23 normal LVEF, mild LVH, no significant valvular abnormalities.   Presents today for follow-up independently.  Has BP cuff at home but he is not sure if it is accurate.  He has been out of his losartan for 2 days.  Has upcoming endoscopy and colonoscopy.  Going to physical therapy and able to achieve greater than 4 METS.  Reports no chest pain, dyspnea, edema, orthopnea, PND.  EKGs/Labs/Other Studies Reviewed:   The following studies were reviewed today: Cardiac Studies & Procedures   CARDIAC CATHETERIZATION  CARDIAC CATHETERIZATION 07/10/2020  Narrative  2nd Mrg-1 lesion is 95% stenosed.  2nd Mrg-2 lesion is 30% stenosed.  RPDA lesion is 100% stenosed.  A drug-eluting stent was successfully placed using a STENT RESOLUTE ONYX 2.75X8.  Post intervention, there is a 0% residual stenosis.  Post intervention, there is a 0% residual stenosis.  LV end diastolic pressure is normal.  1. 2 vessel obstructive CAD -95% ostial OM2. This was at the proximal margin of the old stents - 100% distal PDA. This fills by left to right collaterals. 2. Normal LVEDP 3. Successful PCI of the ostium of the OM with DES x 1.  Plan: DAPT indefinitely given multiple stents in OM. Anticipate same day DC. Will hold losartan and HCTZ until renal function repeated as outpatient.  Findings Coronary Findings Diagnostic  Dominance: Right  Left Main Vessel was injected. Vessel is normal in caliber. Vessel is angiographically normal.  Left Anterior Descending Vessel was injected. Vessel is normal in caliber. The vessel exhibits minimal luminal irregularities.  First Diagonal Branch Vessel was injected. Vessel is large  in size. The vessel exhibits minimal luminal irregularities.  Left Circumflex  First Obtuse Marginal Branch Vessel is small in size.  Second Obtuse Marginal Branch 2nd Mrg-1 lesion is 95% stenosed. 2nd Mrg-2 lesion is 30% stenosed. The lesion was previously treated using a drug eluting stent over 2 years ago.  Right Coronary Artery  Right Posterior Descending Artery Vessel is small in size. Collaterals RPDA filled by collaterals from Dist LAD.  RPDA lesion is 100% stenosed.  Intervention  2nd Mrg-1 lesion Stent (Also treats lesions: 2nd Mrg-2) CATH LAUNCHER 6FR EBU3.5 guide catheter was inserted. Lesion crossed with guidewire using a WIRE ASAHI PROWATER 180CM. Pre-stent angioplasty was performed using a BALLOON WOLVERINE 2.50X10. A drug-eluting stent was successfully placed using a STENT RESOLUTE ONYX 2.75X8. Stent strut is well apposed. Post-stent angioplasty was performed using a BALLOON SAPPHIRE [place] 3.0X12.  Post-Intervention Lesion Assessment The intervention was successful. Pre-interventional TIMI flow is 2. Post-intervention TIMI flow is 3. No complications occurred at this lesion. There is a 0% residual stenosis post intervention.  2nd Mrg-2 lesion Stent (Also treats lesions: 2nd Mrg-1) See details in 2nd Mrg-1 lesion. Post-Intervention Lesion Assessment The intervention was successful. Pre-interventional TIMI flow is 3. Post-intervention TIMI flow is 3. No complications occurred at this lesion. There is a 0% residual stenosis post intervention.   STRESS TESTS  MYOCARDIAL PERFUSION IMAGING 01/05/2023  Narrative   Findings are consistent with infarction. The study is low risk.   No ST deviation was noted.   LV perfusion is abnormal. There is no evidence of ischemia. There is evidence of infarction. Defect 1: There is a small defect with mild reduction in uptake present in the apical inferior location(s) that is fixed. There is abnormal wall motion in the defect area.  Consistent with infarction.   Left ventricular function is abnormal. Global function is mildly reduced. Nuclear stress EF: 52 %. The left ventricular ejection fraction is mildly decreased (45-54%). End diastolic cavity size is normal. End systolic cavity size is normal.   Prior study available for comparison from 08/14/2021. No changes compared to prior study.   ECHOCARDIOGRAM  ECHOCARDIOGRAM COMPLETE 02/15/2023  Narrative ECHOCARDIOGRAM REPORT    Patient Name:   [name] Date of Exam: 02/15/2023 Medical Rec #:  [id number]          Height:       71.0 in Accession #:    [id number]         Weight:       211.0 lb Date of Birth:  06-17-1951         BSA:          2.157 m Patient Age:    71 years           BP:           124/59 mmHg Patient Gender: M                  HR:           68 bpm. Exam Location:  Outpatient  Procedure: 3D Echo, 2D Echo, Color Doppler, Cardiac Doppler and Strain Analysis  Indications:    Dyspnea  History:        Patient has prior history of Echocardiogram examinations, most recent 07/29/2020. CHF, CAD and Previous Myocardial Infarction, Stroke, Arrythmias:LBBB; Risk Factors:Hypertension, Dyslipidemia and Former Smoker.  Sonographer:    Jeryl Columbia RDCS Referring Phys: [id number] Lora Glomski S Surina Storts  IMPRESSIONS   1. Left ventricular ejection fraction, by estimation, is 55%. The left ventricle has normal function. The left ventricle has no regional wall motion abnormalities. There is mild asymmetric left ventricular hypertrophy of the basal-septal segment. Left ventricular diastolic parameters are consistent with Grade I diastolic dysfunction (impaired relaxation). There is septal-lateral dyssynchrony secondary to LBBB. 2. Right ventricular systolic function is normal. The right ventricular size is normal. There is normal pulmonary artery systolic pressure. The estimated right ventricular systolic pressure is 22.2 mmHg. 3. The mitral valve is normal in  structure. Trivial mitral valve regurgitation. 4. The aortic valve is tricuspid. There is mild calcification of the aortic valve. There is mild thickening of the aortic valve. Aortic valve regurgitation is not visualized. 5. The inferior vena cava is normal in size with greater than 50% respiratory variability, suggesting right atrial pressure of 3 mmHg.  Comparison(s): No significant change from prior study.  FINDINGS Left Ventricle: Left ventricular ejection fraction, by estimation, is 55%. The left ventricle has normal function. The left ventricle has no regional wall motion abnormalities. Global longitudinal strain performed but not reported based on interpreter judgement due to suboptimal tracking. 3D ejection fraction reviewed and evaluated as part of the interpretation. Alternate measurement of EF is felt to be most reflective of LV function. The left ventricular internal cavity size was normal in size. There is mild asymmetric left ventricular hypertrophy of the basal-septal segment. Abnormal (paradoxical) septal motion, consistent with left bundle branch block. Left ventricular diastolic parameters are consistent with Grade I diastolic dysfunction (impaired relaxation).  Right Ventricle: The right ventricular size is normal. No increase in right ventricular wall thickness. Right ventricular systolic function is normal. There is normal pulmonary artery systolic pressure. The tricuspid regurgitant velocity is 2.19 m/s, and with an assumed right atrial pressure of 3 mmHg, the estimated right ventricular systolic pressure is 22.2 mmHg.  Left Atrium: Left atrial size was normal in size.  Right Atrium: Right atrial size was normal in size.  Pericardium: There is no evidence of pericardial effusion.  Mitral Valve: The mitral valve is normal in structure. Trivial mitral valve regurgitation.  Tricuspid Valve: The tricuspid valve is normal in structure. Tricuspid valve regurgitation is  trivial.  Aortic Valve: The aortic valve is tricuspid. There is mild calcification of the aortic valve. There is mild thickening of the aortic valve. Aortic valve regurgitation is not visualized.  Pulmonic Valve: The pulmonic valve was normal in structure. Pulmonic valve regurgitation is mild.  Aorta: The aortic root is normal in size and structure.  Venous: The inferior vena cava is normal in size with greater than 50% respiratory variability, suggesting right atrial pressure of 3 mmHg.  IAS/Shunts: The atrial septum is grossly normal.   LEFT VENTRICLE PLAX 2D LVIDd:         4.53 cm   Diastology LVIDs:         2.90 cm   LV e' medial:    5.66 cm/s LV PW:         1.31 cm   LV E/e' medial:  9.4 LV IVS:        1.24 cm   LV e' lateral:   8.27 cm/s LVOT diam:     2.10 cm   LV E/e' lateral: 6.5 LV SV:         60 LV SV Index:   28 LVOT Area:     3.46 cm  3D Volume EF: 3D EF:        61 % LV EDV:       103 ml LV ESV:       41 ml LV SV:        63 ml  RIGHT VENTRICLE RV Basal diam:  3.63 cm RV Mid diam:    2.91 cm RV S prime:     16.00 cm/s TAPSE (M-mode): 2.4 cm  LEFT ATRIUM             Index        RIGHT ATRIUM           Index LA diam:        4.10 cm 1.90 cm/m   RA Area:     13.90 cm LA Vol (A2C):   40.6 ml 18.82 ml/m  RA Volume:   34.40 ml  15.95 ml/m LA Vol (A4C):   26.2 ml 12.15 ml/m LA Biplane Vol: 35.7 ml 16.55 ml/m AORTIC  VALVE LVOT Vmax:   95.60 cm/s LVOT Vmean:  59.200 cm/s LVOT VTI:    0.172 m  AORTA Ao Root diam: 3.60 cm Ao Asc diam:  2.90 cm  MITRAL VALVE               TRICUSPID VALVE MV Area (PHT): 3.91 cm    TR Peak grad:   19.2 mmHg MV Decel Time: 194 msec    TR Vmax:        219.00 cm/s MV E velocity: 53.40 cm/s MV A velocity: 74.50 cm/s  SHUNTS MV E/A ratio:  0.72        Systemic VTI:  0.17 m Systemic Diam: 2.10 cm  Mike Flatten MD Electronically signed by Mike Flatten MD Signature Date/Time: 02/15/2023/4:18:34 PM    Final               EKG:  EKG is not ordered today.   Recent Labs: 12/24/2022: ALT 15 01/25/2023: BUN 32; Creatinine, Ser 1.85; Potassium 4.6; Sodium 138; TSH 2.610 03/30/2023: Hemoglobin 11.2; Platelets 185.0  Recent Lipid Panel    Component Value Date/Time   CHOL 116 12/24/2022 0812   TRIG 95 12/24/2022 0812   TRIG 200 04/20/2009 0000   HDL 60 12/24/2022 0812   CHOLHDL 1.9 12/24/2022 0812   CHOLHDL 3 04/15/2020 1128   VLDL 27.2 04/15/2020 1128   LDLCALC 38 12/24/2022 0812   LDLDIRECT 63.0 01/12/2016 0931   Home Medications   Current Meds  Medication Sig   aspirin EC 81 MG tablet Take 1 tablet (81 mg total) by mouth daily. Swallow whole.   atorvastatin (LIPITOR) 40 MG tablet Take 40 mg by mouth daily.   Blood Pressure Monitoring (BLOOD PRESSURE CUFF) MISC 1 Units by Does not apply route daily.   calcium carbonate (TUMS - DOSED IN MG ELEMENTAL CALCIUM) 500 MG chewable tablet Chew 1 tablet by mouth daily as needed for indigestion or heartburn.   carvedilol (COREG) 25 MG tablet Take 1 tablet (25 mg total) by mouth 2 (two) times daily.   chlorhexidine (PERIDEX) 0.12 % solution Use as directed 15 mLs in the mouth or throat 2 (two) times daily.   chlorthalidone (HYGROTON) 25 MG tablet TAKE 1 TABLET EVERY DAY   clopidogrel (PLAVIX) 75 MG tablet TAKE 1 TABLET EVERY DAY. NEED APPOINTMENT FOR FURTHER REFILLS   Continuous Blood Gluc Sensor (FREESTYLE LIBRE 2 SENSOR) MISC 1 Device by Does not apply route every 14 (fourteen) days.   empagliflozin (JARDIANCE) 25 MG TABS tablet Take 1 tablet (25 mg total) by mouth daily before breakfast.   furosemide (LASIX) 40 MG tablet TAKE 1 TABLET (40 MG TOTAL) BY MOUTH DAILY. NEED APPT.   glucose blood (ACCU-CHEK AVIVA PLUS) test strip uad tid   Insulin Lispro Prot & Lispro (HUMALOG MIX 50/50 KWIKPEN) (50-50) 100 UNIT/ML Kwikpen Inject 24 Units into the skin daily with breakfast.   Insulin Pen Needle 32G X 4 MM MISC Use 1x a day   Microlet Lancets MISC UAD to check  sugars TID.  E11.22   nitroGLYCERIN (NITROSTAT) 0.4 MG SL tablet Place 1 tablet (0.4 mg total) under the tongue every 5 (five) minutes as needed for chest pain (up to 3 doses).   senna-docusate (SENOKOT-S) 8.6-50 MG per tablet Take 2 tablets by mouth 2 (two) times daily. For constipation.   tiZANidine (ZANAFLEX) 2 MG tablet Take 1 tablet (2 mg total) by mouth at bedtime as needed for muscle spasms.   [DISCONTINUED] losartan (COZAAR) 100 MG tablet  TAKE 1 TABLET EVERY DAY   Current Facility-Administered Medications for the 04/04/23 encounter (Office Visit) with Alver Sorrow, NP  Medication   sodium chloride flush (NS) 0.9 % injection 3 mL     Review of Systems      All other systems reviewed and are otherwise negative except as noted above.  Physical Exam    VS:  BP (!) 152/84   Pulse 66   Ht 5\' 11"  (1.803 m)   Wt 213 lb (96.6 kg)   BMI 29.71 kg/m  , BMI Body mass index is 29.71 kg/m.  Wt Readings from Last 3 Encounters:  04/04/23 213 lb (96.6 kg)  03/30/23 211 lb (95.7 kg)  03/08/23 208 lb (94.3 kg)     GEN: Well nourished, overweight, well developed, in no acute distress. HEENT: normal. Neck: Supple, no JVD, carotid bruits, or masses. Cardiac: RRR, no murmurs, rubs, or gallops. No clubbing, cyanosis, edema.  Radials/PT 2+ and equal bilaterally.  Respiratory:  Respirations regular and unlabored, clear to auscultation bilaterally. GI: Soft, nontender, nondistended. MS: No deformity or atrophy. Skin: Warm and dry, no rash. Neuro:  Strength and sensation are intact. Psych: Normal affect.  Assessment & Plan    CAD s/p multiple PCI - OM has required stenting twice. Lifelong DAPT. Known CTO PDA with L-R collateralization. Myoview 01/05/23 with prior infarct but no new ischemia. GDMT aspirin, plavix, amlodipine-atorvastatin, PRN nitroglycerin.  Preop clearance/anemia - Longstanding history likely due to renal function.  Upcoming endoscopy, colonoscopy with GI.  May hold  Plavix 5 days prior to planned procedure.  Continue aspirin throughout periprocedural period.  Exercise tolerance greater than 4 METS.   Per AHA/ACC guidelines, he is deemed acceptable risk for the planned procedure without additional cardiovascular testing. Will route to surgical team so they are aware  Combined systolic and diastolic heart failure - Euvolemic and well compensated on exam.  Echo 02/15/2023 normal LVEF.  GDMT includes Lasix 40mg  QD, Carvedilol 25mg  BID, Losartan 100mg  QD, Jardiacne 25mg  QD.  HTN- BP not at goal less than 130/88.  However, has been out of losartan for 2 days.  Refill losartan 100 mg daily provided. Discussed to monitor BP at home at least 2 hours after medications and sitting for 5-10 minutes. If BP persistently >130/80 despite resuming Losartan plan to transition to Valsartan.  Reports prior intolerance to amlodipine.  OSA - CPAP compliance encouraged.   CKD - Careful titration of diuretic and antihypertensive.    DM2 - follows with endocrinology.  Hx of CVA - Continue aspirin, plavix, atorvastatin.        Disposition: Follow up in 2-3 month(s) with Chilton Si, MD or APP.  Signed, Alver Sorrow, NP 04/04/2023, 4:30 PM [facility]

## 2023-04-04 NOTE — Telephone Encounter (Signed)
Spoke with patient and told him that per cardiology, he should hold his Plavix for 5 days prior to his procedure.  Patient agreed.

## 2023-04-04 NOTE — Patient Instructions (Signed)
Medication Instructions:  Your physician recommends that you continue on your current medications as directed. Please refer to the Current Medication list given to you today.  Please hold Plavix 5 days before procedure  *If you need a refill on your cardiac medications before your next appointment, please call your pharmacy*  Follow-Up: At [facility], you and your health needs are our priority.  As part of our continuing mission to provide you with exceptional heart care, we have created designated Provider Care Teams.  These Care Teams include your primary Cardiologist (physician) and Advanced Practice Providers (APPs -  Physician Assistants and Nurse Practitioners) who all work together to provide you with the care you need, when you need it.  We recommend signing up for the patient portal called "MyChart".  Sign up information is provided on this After Visit Summary.  MyChart is used to connect with patients for Virtual Visits (Telemedicine).  Patients are able to view lab/test results, encounter notes, upcoming appointments, etc.  Non-urgent messages can be sent to your provider as well.   To learn more about what you can do with MyChart, go to [email or web address].    Your next appointment:   Follow up as scheduled   Other Instructions Please bring blood pressure cuff to your next appointment.

## 2023-04-04 NOTE — Telephone Encounter (Signed)
   Patient Name: [name]  DOB: January 29, 1951 MRN: [id number]  Primary Cardiologist: Chilton Si, MD  Chart reviewed as part of pre-operative protocol coverage. Seen in clinic 04/04/23 doing well. Given past medical history and time since last visit, based on ACC/AHA guidelines, Drue Huizinga is at acceptable risk for the planned procedure without further cardiovascular testing.   May hold Plavix 5 days prior to planned procedure.   The patient was advised that if he develops new symptoms prior to surgery to contact our office to arrange for a follow-up visit, and he verbalized understanding.  I will route this recommendation to the requesting party via Epic fax function and remove from pre-op pool.  Please call with questions.  Alver Sorrow, NP 04/04/2023, 12:07 PM

## 2023-04-04 NOTE — Telephone Encounter (Signed)
-----   Message from Alver Sorrow, NP sent at 04/04/2023 12:08 PM EDT ----- Clearance provided

## 2023-04-05 ENCOUNTER — Encounter: Payer: Self-pay | Admitting: Internal Medicine

## 2023-04-05 ENCOUNTER — Ambulatory Visit: Payer: Medicare HMO

## 2023-04-05 ENCOUNTER — Ambulatory Visit (INDEPENDENT_AMBULATORY_CARE_PROVIDER_SITE_OTHER): Payer: Medicare HMO | Admitting: Internal Medicine

## 2023-04-05 VITALS — BP 131/66

## 2023-04-05 VITALS — BP 146/76 | HR 70 | Ht 71.0 in | Wt 212.4 lb

## 2023-04-05 DIAGNOSIS — Z794 Long term (current) use of insulin: Secondary | ICD-10-CM | POA: Diagnosis not present

## 2023-04-05 DIAGNOSIS — E1165 Type 2 diabetes mellitus with hyperglycemia: Secondary | ICD-10-CM | POA: Diagnosis not present

## 2023-04-05 DIAGNOSIS — R278 Other lack of coordination: Secondary | ICD-10-CM

## 2023-04-05 DIAGNOSIS — R293 Abnormal posture: Secondary | ICD-10-CM

## 2023-04-05 DIAGNOSIS — E7849 Other hyperlipidemia: Secondary | ICD-10-CM | POA: Diagnosis not present

## 2023-04-05 DIAGNOSIS — R262 Difficulty in walking, not elsewhere classified: Secondary | ICD-10-CM

## 2023-04-05 DIAGNOSIS — E1159 Type 2 diabetes mellitus with other circulatory complications: Secondary | ICD-10-CM | POA: Diagnosis not present

## 2023-04-05 DIAGNOSIS — R2681 Unsteadiness on feet: Secondary | ICD-10-CM

## 2023-04-05 DIAGNOSIS — E119 Type 2 diabetes mellitus without complications: Secondary | ICD-10-CM

## 2023-04-05 DIAGNOSIS — R2689 Other abnormalities of gait and mobility: Secondary | ICD-10-CM

## 2023-04-05 DIAGNOSIS — M6281 Muscle weakness (generalized): Secondary | ICD-10-CM

## 2023-04-05 LAB — POCT GLYCOSYLATED HEMOGLOBIN (HGB A1C): Hemoglobin A1C: 8.3 % — AB (ref 4.0–5.6)

## 2023-04-05 MED ORDER — LANTUS SOLOSTAR 100 UNIT/ML ~~LOC~~ SOPN: 14.0000 [IU] | PEN_INJECTOR | Freq: Every day | SUBCUTANEOUS | 3 refills | Status: DC

## 2023-04-05 MED ORDER — INSULIN LISPRO (1 UNIT DIAL) 100 UNIT/ML (KWIKPEN): 6.0000 [IU] | PEN_INJECTOR | Freq: Two times a day (BID) | SUBCUTANEOUS | 3 refills | Status: DC

## 2023-04-05 MED ORDER — INSULIN PEN NEEDLE 32G X 4 MM MISC: 3 refills | Status: DC

## 2023-04-05 NOTE — Patient Instructions (Addendum)
Please stop: - Humalog 50/50  Start: - Lantus 14 units at bedtime - Humalog 6-10 units 15 min before brunch and dinner  Please return in 3-4 months.

## 2023-04-05 NOTE — Therapy (Unsigned)
OUTPATIENT PHYSICAL THERAPY NEURO TREATMENT   Patient Name: [name] MRN: [id number] DOB:14-Feb-1951, 72 y.o., male Today's Date: 04/05/2023   PCP: Pincus Sanes, MD REFERRING PROVIDER: Pincus Sanes, MD  END OF SESSION:  PT End of Session - 04/05/23 1519     Visit Number 4    Number of Visits 13    Date for PT Re-Evaluation 05/17/23    Authorization Type aetna medicare    PT Start Time 1527    PT Stop Time 1614    PT Time Calculation (min) 47 min    Activity Tolerance Patient limited by pain    Behavior During Therapy [facility] for tasks assessed/performed             Past Medical History:  Diagnosis Date   ALLERGIC RHINITIS    CAD, NATIVE VESSEL    BMS to OM1 2001, DES to BMS 2005   Chronic back pain    herniated disc   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   Chronic kidney disease    Constipation    takes Carafate four times day   DIAB W/UNSPEC COMP TYPE II/UNSPEC TYPE UNCNTRL    ERECTILE DYSFUNCTION    GERD    HYPERLIPIDEMIA-MIXED    HYPERTENSION, BENIGN    LBBB (left bundle branch block) 02/02/2016   MORTON'S NEUROMA, RIGHT    OSA on CPAP 09/09/2020   Peripheral neuropathy    Seasonal allergies    takes Allegra and Benadryl daily prn;uses Flonase daily   SHOULDER PAIN, RIGHT    Snoring 09/09/2020   Stroke, 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, s/p CIR    TIA on medication 02/2012   Unstable angina (HCC) 07/09/2020   Past Surgical History:  Procedure Laterality Date   ANGIOPLASTY     COLONOSCOPY     CORONARY ANGIOPLASTY  2005   2 stents   CORONARY STENT INTERVENTION N/A 07/10/2020   Procedure: CORONARY STENT INTERVENTION;  Surgeon: Swaziland, Peter M, MD;  Location: [facility] INVASIVE CV LAB;  Service: Cardiovascular;  Laterality: N/A;   DENTAL SURGERY     LARYNGOPLASTY  08/07/2012   Procedure: LARYNGOPLASTY;  Surgeon: Serena Colonel, MD;  Location: [facility] OR;  Service: ENT;  Laterality: Left;  Left Vocal Cord  Medialyzation   LEFT HEART CATH AND CORONARY ANGIOGRAPHY N/A 07/10/2020   Procedure: LEFT HEART CATH AND CORONARY ANGIOGRAPHY;  Surgeon: Swaziland, Peter M, MD;  Location: [facility] INVASIVE CV LAB;  Service: Cardiovascular;  Laterality: N/A;   left knee surgury     x 2    stent  2001, 2004   coronary stents   Patient Active Problem List   Diagnosis Date Noted   Back pain 03/04/2023   Diarrhea 06/09/2022   Ganglion cyst of wrist, left 12/14/2021   Viral gastroenteritis 12/14/2021   Urinary frequency 11/06/2021   Coronary artery disease 03/25/2021   Hoarse 03/25/2021   Major depressive disorder, recurrent episode, moderate (HCC) 03/25/2021   Major depressive disorder, single episode 03/25/2021   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   Observation and evaluation for other specified suspected conditions 03/25/2021   Vocal cord paralysis 03/25/2021   Diabetic peripheral neuropathy (HCC) 12/25/2020   Orthopnea 12/16/2020   OSA on CPAP 09/09/2020   Unstable angina (HCC) 07/09/2020   Sciatica of left side 07/11/2019   Cough 07/11/2019   Edema leg 10/26/2018   History of stroke 10/26/2018   DOE (dyspnea on exertion) 10/26/2018   Left hip pain 07/14/2018  Acute left ankle pain 07/14/2018   Chest pain 07/14/2018   Acute non-recurrent ethmoidal sinusitis 03/13/2018   Nonintractable headache 02/24/2018   CRI (chronic renal insufficiency), stage 3 (moderate) 02/22/2017   Anemia 02/16/2017   Lower respiratory infection 02/16/2017   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   LBBB (left bundle branch block) 02/02/2016   Nonallopathic lesion of lumbosacral region 12/11/2014   Nonallopathic lesion of sacral region 12/11/2014   Nonallopathic lesion of thoracic region 12/11/2014   Arthritis of left hip 11/20/2014   Ischial bursitis of left side 10/28/2014   Hamstring tightness of left lower extremity 09/16/2014   Piriformis syndrome of left side 09/16/2014   Dysphonia  06/22/2013   Hemiparesis affecting left side as late effect of stroke (HCC) 10/10/2012   Dysphagia following cerebrovascular accident 08/14/2012   Chronic back pain    Peripheral neuropathy (HCC)    TIA (transient ischemic attack) 04/08/2012   Stroke (HCC) 02/21/2012   MORTON'S NEUROMA, RIGHT 05/29/2010   Poorly controlled type 2 diabetes mellitus with circulatory disorder (HCC) 04/13/2010   ALLERGIC RHINITIS 04/13/2010   GERD 04/13/2010   ERECTILE DYSFUNCTION 03/12/2009   Essential hypertension 03/12/2009   CAD S/P percutaneous coronary angioplasty 03/12/2009   Hyperlipidemia 03/07/2009    ONSET DATE: 03/04/2023 referral  REFERRING DIAG: M54.9 (ICD-10-CM) - Back pain, unspecified back location, unspecified back pain laterality, unspecified chronicity I69.354 (ICD-10-CM) - Hemiparesis affecting left side as late effect of stroke  THERAPY DIAG:  Abnormal posture  Difficulty in walking, not elsewhere classified  Muscle weakness (generalized)  Other lack of coordination  Unsteadiness on feet  Other abnormalities of gait and mobility  Rationale for Evaluation and Treatment: Rehabilitation  SUBJECTIVE:                                                                                                                                                                                             SUBJECTIVE STATEMENT: Patient reports doing the same. Will still have some dark stool, no blood in urine. Reports more of a burning sensation in L low back. Denies falls, but reports multiple near falls. Has abdominal CT 5/29 and endo/colonoscopy 6/4.  Pt accompanied by: self  PERTINENT HISTORY: fall in 1997, CVA 2013  PAIN:  Are you having pain? Yes: NPRS scale: 7-8/10 Pain location: mid/low back radiating around RLQ/LLQ  Pain description: dull, sharp, tingling on L low back/buttock Aggravating factors: standing up, walking Relieving factors: none reported *reports pain will wake him up  at night  Today's Vitals   04/05/23 1550  BP: 131/66   There is no height or weight  on file to calculate BMI.   PRECAUTIONS: Fall and Other: L hemiparesis   PATIENT GOALS: "strengthen my body"  OBJECTIVE:   DIAGNOSTIC FINDINGS:  Lumbar MRI 03/27/23 IMPRESSION: No explanation for symptoms. Less than typical degenerative change for age with no neural impingement or visible inflammation.  TODAY'S TREATMENT:                                                                                                                              -NuStep level 4 B UE/LE x7 mins (unable to tolerate more due to pain)  -anterior ball roll out, B direction ball roll out  -unable to tolerate SKTC today  -unable to gentle small ROM prone press ups  -significant allodynia to global back L side > R side   PATIENT EDUCATION: Education details: initial HEP to be completed within pain-tolerable ROM  Person educated: Patient Education method: Explanation Education comprehension: verbalized understanding and needs further education  HOME EXERCISE PROGRAM: Access Code: [email or web address] URL: [email or web address] Date: 03/29/2023 Prepared by: Merry Lofty  Exercises - Supine Lower Trunk Rotation  - 1 x daily - 7 x weekly - 3 sets - 10 reps - Supine Posterior Pelvic Tilt  - 1 x daily - 7 x weekly - 3 sets - 10 reps - Prone Press Up On Elbows  - 1 x daily - 7 x weekly - 3 sets - 10 reps  GOALS: Goals reviewed with patient? Yes  SHORT TERM GOALS: Target date: 04/15/23  Pt will be independent with initial HEP for improved functional strength and reduced pain  Baseline: to be provided Goal status: INITIAL  2.  Patient will improve Oswestry score to </= 22 to indicate a reduction in disability due to LBP Baseline: 26 (severe) Goal status: INITIAL  3.  5x STS Baseline: to be assessed when medically cleared by imaging Goal status: INITIAL  4.  TUG goal Baseline: to be assessed when  medically cleared by imaging Goal status: INITIAL   LONG TERM GOALS: Target date: 05/17/23  Pt will be independent with initial HEP for improved functional strength and reduced pain  Baseline: to be provided Goal status: INITIAL  Patient will improve Oswestry score to </= 22 to indicate a reduction in disability due to LBP Baseline: 26 (severe) Goal status: INITIAL  5x STS Baseline: to be assessed when medically cleared by imaging Goal status: INITIAL  TUG goal Baseline: to be assessed when medically cleared by imaging Goal status: INITIAL   ASSESSMENT:  CLINICAL IMPRESSION: Patient seen for skilled PT session with emphasis on attempting to continue LBP management exercises. He continues to be profoundly limited by pain that was not explained by recent imaging. Upon palpation, he does not have any overt muscle spasms/trigger points or areas of significant tension. He does, however, demonstrate allodynia over the entirety of his pain, L side> R side with extreme sensitivity to very light touch. If patient does not tolerate gentle stretching HEP (provided  to patient) over the next week, he may benefit from going on hold with PT until further imaging/medical testing is completed. Continue POC as able.   OBJECTIVE IMPAIRMENTS: Abnormal gait, decreased activity tolerance, decreased coordination, decreased endurance, decreased knowledge of condition, decreased mobility, difficulty walking, decreased ROM, decreased strength, increased muscle spasms, impaired tone, impaired UE functional use, improper body mechanics, postural dysfunction, and pain.   ACTIVITY LIMITATIONS: carrying, lifting, bending, standing, squatting, sleeping, stairs, bed mobility, bathing, hygiene/grooming, locomotion level, and caring for others  PARTICIPATION LIMITATIONS: meal prep, cleaning, laundry, interpersonal relationship, driving, shopping, community activity, and yard work  PERSONAL FACTORS: Age, Past/current  experiences, Time since onset of injury/illness/exacerbation, Transportation, and 3+ comorbidities: see above  are also affecting patient's functional outcome.   REHAB POTENTIAL: Fair time since onset, unknown reason for exacerbation  CLINICAL DECISION MAKING: Evolving/moderate complexity  EVALUATION COMPLEXITY: Moderate  PLAN:  PT FREQUENCY: 2x/week  PT DURATION: 6 weeks  PLANNED INTERVENTIONS: Therapeutic exercises, Therapeutic activity, Neuromuscular re-education, Balance training, Gait training, Patient/Family education, Self Care, Joint mobilization, Stair training, Vestibular training, Visual/preceptual remediation/compensation, Orthotic/Fit training, DME instructions, Aquatic Therapy, Electrical stimulation, Spinal mobilization, Cryotherapy, Moist heat, Taping, Manual therapy, and Re-evaluation  PLAN FOR NEXT SESSION: results from imaging? TUG, 5xSTS, HEP   Westley Foots, PT, DPT, CBIS 04/05/2023, 4:17 PM

## 2023-04-05 NOTE — Progress Notes (Signed)
Patient ID: [name], male   DOB: January 20, 1951, 72 y.o.   MRN: [id number]  HPI: [name] is a 72 y.o.-year-old male, returning for follow-up for DM2, dx in 2007, insulin-dependent since 2011, uncontrolled, with complications (CAD, stroke, CKD, DR, ED, PN). Pt. previously saw Dr. Everardo All, last visit with me 4 months ago.  Interim history: No increased urination, nausea, chest pain.  He does have blurry vision.  Reviewed HbA1c: Lab Results  Component Value Date   HGBA1C 8.4 (A) 12/21/2022   HGBA1C 8.0 (A) 08/17/2022   HGBA1C 7.4 (A) 04/16/2022   HGBA1C 6.9 (A) 01/04/2022   HGBA1C 6.8 (A) 11/03/2021   HGBA1C 6.6 (A) 08/18/2021   HGBA1C 6.7 (A) 05/11/2021   HGBA1C 6.6 (A) 02/05/2021   HGBA1C 7.4 (A) 10/07/2020   HGBA1C 7.1 (A) 08/08/2020  04/22/2022: HbA1c 7.5%  Previously on: - Humalog 50/50 (prev. From PAP, now covered by the new insurance w/o a PA) 22 units after brunch >> moved to 15 minutes before brunch -  -stopped  in 12/2021 because feeling "weird, sweaty" He has significant tremors and cannot draw insulin from vials.  Currently on: - Humalog 50/50 24 >> 18-20 >> 22 units 15 min before brunch He tried  [place] 10 mg before b'fast >> too $$$, did not qualify for PAP  Pt checks his sugars >4x a day and they are:  Previously:  Prev.:   Lowest sugar was 54 >> 70s >> 58 >> 67; he has hypoglycemia awareness at 70.  Highest sugar was 264 >> 280s >> 290 >> 300s.  Glucometer: Accu-Chek  She usually has 2 meals: Brunch (11-am -2 pm) and dinner (6-8 pm).  - + CKD-sees nephrology, last BUN/creatinine:  02/26/2023: ACR 85 Lab Results  Component Value Date   BUN 32 (H) 01/25/2023   BUN 34 (H) 12/24/2022   CREATININE 1.85 (H) 01/25/2023   CREATININE 2.16 (H) 12/24/2022  04/22/2022: ACR 33.7 On Cozaar 100 mg daily.  - + HL; last set of lipids: Lab Results  Component Value Date   CHOL 116 12/24/2022   HDL 60 12/24/2022   LDLCALC 38 12/24/2022   LDLDIRECT  63.0 01/12/2016   TRIG 95 12/24/2022   CHOLHDL 1.9 12/24/2022  On Lipitor 40 mg daily.  - last eye exam was in 11/2022 (Dr. Ashley Royalty): + DR reportedly. + Cataracts.   On ASA 81.  - no numbness and tingling in his feet.  Last foot exam 09/2022 by Dr. Stacie Acres.  [facility] 01/25/2023: TSH 2.61 04/22/2022: TSH 2.46  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   Chronic kidney disease    Constipation    takes Carafate four times day   DIAB W/UNSPEC COMP TYPE II/UNSPEC TYPE UNCNTRL    ERECTILE DYSFUNCTION    GERD    HYPERLIPIDEMIA-MIXED    HYPERTENSION, BENIGN    LBBB (left bundle branch block) 02/02/2016   MORTON'S NEUROMA, RIGHT    OSA on CPAP 09/09/2020   Peripheral neuropathy    Seasonal allergies    takes Allegra and Benadryl daily prn;uses Flonase daily   SHOULDER PAIN, RIGHT    Snoring 09/09/2020   Stroke, 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, s/p CIR  TIA on medication 02/2012   Unstable angina (HCC) 07/09/2020   Past Surgical History:  Procedure Laterality Date   ANGIOPLASTY     COLONOSCOPY     CORONARY ANGIOPLASTY  2005   2 stents   CORONARY STENT INTERVENTION N/A 07/10/2020   Procedure: CORONARY STENT INTERVENTION;  Surgeon: Swaziland, Peter M, MD;  Location: [facility] INVASIVE CV LAB;  Service: Cardiovascular;  Laterality: N/A;   DENTAL SURGERY     LARYNGOPLASTY  08/07/2012   Procedure: LARYNGOPLASTY;  Surgeon: Serena Colonel, MD;  Location: [facility] OR;  Service: ENT;  Laterality: Left;  Left Vocal Cord Medialyzation   LEFT HEART CATH AND CORONARY ANGIOGRAPHY N/A 07/10/2020   Procedure: LEFT HEART CATH AND CORONARY ANGIOGRAPHY;  Surgeon: Swaziland, Peter M, MD;  Location: [facility] INVASIVE CV LAB;  Service: Cardiovascular;  Laterality: N/A;   left knee  surgury     x 2    stent  2001, 2004   coronary stents   Social History   Socioeconomic History   Marital status: Legally Separated    Spouse name: Not on file   Number of children: 3   Years of education: college   Highest education level: Not on file  Occupational History   Occupation: disabled    Employer: [employer]  Tobacco Use   Smoking status: Former    Packs/day: 2.50    Years: 9.00    Additional pack years: 0.00    Total pack years: 22.50    Types: Cigarettes    Quit date: 02/24/1983    Years since quitting: 40.1   Smokeless tobacco: Never  Vaping Use   Vaping Use: Never used  Substance and Sexual Activity   Alcohol use: No    Alcohol/week: 0.0 standard drinks of alcohol   Drug use: No   Sexual activity: Never  Other Topics Concern   Not on file  Social History Narrative      Social Determinants of Health   Financial Resource Strain: Low Risk  (03/05/2022)   Overall Financial Resource Strain (CARDIA)    Difficulty of Paying Living Expenses: Not hard at all  Food Insecurity: No Food Insecurity (03/05/2022)   Hunger Vital Sign    Worried About Running Out of Food in the Last Year: Never true    Ran Out of Food in the Last Year: Never true  Transportation Needs: No Transportation Needs (03/05/2022)   PRAPARE - Administrator, Civil Service (Medical): No    Lack of Transportation (Non-Medical): No  Physical Activity: Sufficiently Active (03/05/2022)   Exercise Vital Sign    Days of Exercise per Week: 3 days    Minutes of Exercise per Session: 60 min  Stress: No Stress Concern Present (03/05/2022)   [employer] of Occupational Health - Occupational Stress Questionnaire    Feeling of Stress : Not at all  Social Connections: Socially Isolated (03/05/2022)   Social Connection and Isolation Panel [NHANES]    Frequency of Communication with Friends and Family: Three times a week    Frequency of Social Gatherings with Friends and Family: Three times  a week    Attends Religious Services: Never    Active Member of Clubs or Organizations: No    Attends Banker Meetings: Never    Marital Status: Separated  Intimate Partner Violence: Not At Risk (03/05/2022)   Humiliation, Afraid, Rape, and Kick questionnaire    Fear of Current or Ex-Partner: No    Emotionally Abused: No    Physically  Abused: No    Sexually Abused: No   Current Outpatient Medications on File Prior to Visit  Medication Sig Dispense Refill   aspirin EC 81 MG tablet Take 1 tablet (81 mg total) by mouth daily. Swallow whole. 30 tablet 11   atorvastatin (LIPITOR) 40 MG tablet Take 40 mg by mouth daily.     Blood Pressure Monitoring (BLOOD PRESSURE CUFF) MISC 1 Units by Does not apply route daily. 1 each 0   calcium carbonate (TUMS - DOSED IN MG ELEMENTAL CALCIUM) 500 MG chewable tablet Chew 1 tablet by mouth daily as needed for indigestion or heartburn.     carvedilol (COREG) 25 MG tablet Take 1 tablet (25 mg total) by mouth 2 (two) times daily. 180 tablet 3   chlorhexidine (PERIDEX) 0.12 % solution Use as directed 15 mLs in the mouth or throat 2 (two) times daily.     chlorthalidone (HYGROTON) 25 MG tablet TAKE 1 TABLET EVERY DAY 90 tablet 3   clopidogrel (PLAVIX) 75 MG tablet TAKE 1 TABLET EVERY DAY. NEED APPOINTMENT FOR FURTHER REFILLS 90 tablet 3   Continuous Blood Gluc Sensor (FREESTYLE LIBRE 2 SENSOR) MISC 1 Device by Does not apply route every 14 (fourteen) days. 6 each 3   empagliflozin (JARDIANCE) 25 MG TABS tablet Take 1 tablet (25 mg total) by mouth daily before breakfast. 90 tablet 3   furosemide (LASIX) 40 MG tablet TAKE 1 TABLET (40 MG TOTAL) BY MOUTH DAILY. NEED APPT. 90 tablet 3   glucose blood (ACCU-CHEK AVIVA PLUS) test strip uad tid 100 each 12   Insulin Lispro Prot & Lispro (HUMALOG MIX 50/50 KWIKPEN) (50-50) 100 UNIT/ML Kwikpen Inject 24 Units into the skin daily with breakfast. 15 mL 1   Insulin Pen Needle 32G X 4 MM MISC Use 1x a day 100 each  3   losartan (COZAAR) 100 MG tablet Take 1 tablet (100 mg total) by mouth daily. 90 tablet 3   Microlet Lancets MISC UAD to check sugars TID.  E11.22 270 each 3   nitroGLYCERIN (NITROSTAT) 0.4 MG SL tablet Place 1 tablet (0.4 mg total) under the tongue every 5 (five) minutes as needed for chest pain (up to 3 doses). 25 tablet 3   senna-docusate (SENOKOT-S) 8.6-50 MG per tablet Take 2 tablets by mouth 2 (two) times daily. For constipation.     tiZANidine (ZANAFLEX) 2 MG tablet Take 1 tablet (2 mg total) by mouth at bedtime as needed for muscle spasms. 30 tablet 0   Current Facility-Administered Medications on File Prior to Visit  Medication Dose Route Frequency Provider Last Rate Last Admin   sodium chloride flush (NS) 0.9 % injection 3 mL  3 mL Intravenous Q12H Chilton Si, MD       Allergies  Allergen Reactions   Penicillins Anaphylaxis   Pneumococcal Vaccines Anaphylaxis   Shellfish Allergy Anaphylaxis   Gabapentin     Urinary retention   Influenza Vaccines    Lisinopril Cough   Sildenafil Other (See Comments)    Other reaction(s): Headache, Dizziness   Topiramate Other (See Comments)    Chest spasms and numbness   Zofran [Ondansetron]    Amlodipine Other (See Comments)    LE edema   Levitra [Vardenafil] Other (See Comments)    headaches   Metformin Nausea And Vomiting   Tadalafil Other (See Comments)    dizziness   Family History  Problem Relation Age of Onset   Cancer Mother    Liver cancer Brother  Colon cancer Neg Hx    Esophageal cancer Neg Hx    PE: BP (!) 146/76 (BP Location: Right Arm, Patient Position: Sitting, Cuff Size: Normal)   Pulse 70   Ht 5\' 11"  (1.803 m)   Wt 212 lb 6.4 oz (96.3 kg)   SpO2 99%   BMI 29.62 kg/m  Wt Readings from Last 3 Encounters:  04/04/23 213 lb (96.6 kg)  03/30/23 211 lb (95.7 kg)  03/08/23 208 lb (94.3 kg)   Constitutional: overweight, in NAD, walks with a cane Eyes: EOMI, no exophthalmos ENT: no thyromegaly, no  cervical lymphadenopathy Cardiovascular: RRR, No MRG Respiratory: CTA B Musculoskeletal: no deformities Skin: no rashes Neurological: + tremor with outstretched hands, + L sided paresis  ASSESSMENT: 1. DM2, insulin-dependent, uncontrolled, with complications - CAD - s/p BMS in 2001, and DES in 2005 - stroke - CKD - DR - ED - PN - Hypoglycemia-last in 2020  2. HL  PLAN:  1. Patient with long history of type 2 diabetes, insulin-dependent, on injectable antidiabetic regimen with premixed insulin (Humalog 50/50), adjusted since last visit.  Of note, he cannot start an SGLT2 inhibitor due to price and he did not qualify for patient assistance.  At last visit, reviewing his CGM trends, sugars appears to be around target but they started increasing after 6 AM and peaking after brunch.  I suggested to switch to basal/bolus insulin regimen but he had a lot of 50/50 insulin at home and he wanted to change to worse the end of the year, after running out.  We discussed about injecting insulin earlier, at least 15 minutes before meals.  We also discussed about decreasing sweets and starches.  HbA1c at last check was higher, at 8.4%. CGM interpretation: -At today's visit, we reviewed his CGM downloads: It appears that 57% of values are in target range (goal >70%), while 43% are higher than 180 (goal <25%), and 0% are lower than 70 (goal <4%).  The calculated average blood sugar is 177.  The projected HbA1c for the next 3 months (GMI) is 7.5%. -Reviewing the CGM trends, sugars appear to be above target after dinner and they only decreased slightly overnight, but not to goal.  They increase significantly after brunch and then decrease with the best blood sugars of the day being before dinner. -At today's visit, we again discussed that his premixed insulin regimen is not working well for him.  I again suggested to stop this and switch to a basal-bolus insulin regimen.  He agrees to do so.  Will start Lantus  and Humalog.  I advised him to adjust the doses as needed based on the size and consistency of his meals and also blood sugars. - I suggested to:  Patient Instructions  Please stop: - Humalog 50/50  Start: - Lantus 14 units at bedtime - Humalog 6-10 units 15 min before brunch and dinner  Please return in 3-4 months.  - we checked his HbA1c: 8.3% (only slightly better than before). - advised to check sugars at different times of the day - 4x a day, rotating check times - advised for yearly eye exams >> he is UTD - return to clinic in 3-4 months  2. HL -Reviewed the latest lipid panel from 12/24/2022: All fractions at goal -He continues Lipitor 40 mg daily without side effects  Carlus Pavlov, MD PhD [facility]

## 2023-04-07 ENCOUNTER — Ambulatory Visit: Payer: Medicare HMO

## 2023-04-12 ENCOUNTER — Encounter: Payer: Self-pay | Admitting: Physical Therapy

## 2023-04-12 ENCOUNTER — Ambulatory Visit: Payer: Medicare HMO | Admitting: Physical Therapy

## 2023-04-12 VITALS — BP 123/66 | HR 66

## 2023-04-12 DIAGNOSIS — R278 Other lack of coordination: Secondary | ICD-10-CM

## 2023-04-12 DIAGNOSIS — R293 Abnormal posture: Secondary | ICD-10-CM | POA: Diagnosis not present

## 2023-04-12 DIAGNOSIS — R2689 Other abnormalities of gait and mobility: Secondary | ICD-10-CM

## 2023-04-12 DIAGNOSIS — M6281 Muscle weakness (generalized): Secondary | ICD-10-CM

## 2023-04-12 DIAGNOSIS — R262 Difficulty in walking, not elsewhere classified: Secondary | ICD-10-CM

## 2023-04-12 DIAGNOSIS — R2681 Unsteadiness on feet: Secondary | ICD-10-CM

## 2023-04-12 NOTE — Therapy (Signed)
OUTPATIENT PHYSICAL THERAPY NEURO TREATMENT   Patient Name: Amontae Puello MRN: [id number] DOB:[birth year], 72 y.o., male Today's Date: 04/12/2023   PCP: Pincus Sanes, MD REFERRING PROVIDER: Pincus Sanes, MD  END OF SESSION:  PT End of Session - 04/12/23 1617     Visit Number 5    Number of Visits 13    Date for PT Re-Evaluation 05/17/23    Authorization Type [employer]etna medicare    PT Start Time 1620    PT Stop Time 1700    PT Time Calculation (min) 40 min    Equipment Utilized During Treatment Gait belt    Activity Tolerance Patient limited by pain    Behavior During Therapy WFL for tasks assessed/performed             Past Medical History:  Diagnosis Date   ALLERGIC RHINITIS    CAD, NATIVE VESSEL    BMS to OM1 2001, DES to BMS 2005   Chronic back pain    herniated disc   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   Chronic kidney disease    Constipation    takes Carafate four times day   DIAB W/UNSPEC COMP TYPE II/UNSPEC TYPE UNCNTRL    ERECTILE DYSFUNCTION    GERD    HYPERLIPIDEMIA-MIXED    HYPERTENSION, BENIGN    LBBB (left bundle branch block) 02/02/2016   MORTON'S NEUROMA, RIGHT    OSA on CPAP 09/09/2020   Peripheral neuropathy    Seasonal allergies    takes Allegra and Benadryl daily prn;uses Flonase daily   SHOULDER PAIN, RIGHT    Snoring 09/09/2020   Stroke, 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, s/p CIR    TIA on medication 02/2012   Unstable angina (HCC) 07/09/2020   Past Surgical History:  Procedure Laterality Date   ANGIOPLASTY     COLONOSCOPY     CORONARY ANGIOPLASTY  2005   2 stents   CORONARY STENT INTERVENTION N/A 07/10/2020   Procedure: CORONARY STENT INTERVENTION;  Surgeon: Swaziland, Peter M, MD;  Location: [facility] INVASIVE CV LAB;  Service: Cardiovascular;  Laterality: N/A;   DENTAL SURGERY     LARYNGOPLASTY  08/07/2012   Procedure: LARYNGOPLASTY;  Surgeon: Serena Colonel, MD;  Location: [facility] OR;   Service: ENT;  Laterality: Left;  Left Vocal Cord Medialyzation   LEFT HEART CATH AND CORONARY ANGIOGRAPHY N/A 07/10/2020   Procedure: LEFT HEART CATH AND CORONARY ANGIOGRAPHY;  Surgeon: Swaziland, Peter M, MD;  Location: [facility] INVASIVE CV LAB;  Service: Cardiovascular;  Laterality: N/A;   left knee surgury     x 2    stent  2001, 2004   coronary stents   Patient Active Problem List   Diagnosis Date Noted   Back pain 03/04/2023   Diarrhea 06/09/2022   Ganglion cyst of wrist, left 12/14/2021   Viral gastroenteritis 12/14/2021   Urinary frequency 11/06/2021   Coronary artery disease 03/25/2021   Hoarse 03/25/2021   Major depressive disorder, recurrent episode, moderate (HCC) 03/25/2021   Major depressive disorder, single episode 03/25/2021   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   Observation and evaluation for other specified suspected conditions 03/25/2021   Vocal cord paralysis 03/25/2021   Diabetic peripheral neuropathy (HCC) 12/25/2020   Orthopnea 12/16/2020   OSA on CPAP 09/09/2020   Unstable angina (HCC) 07/09/2020   Sciatica of left side 07/11/2019   Cough 07/11/2019   Edema leg 10/26/2018   History of stroke 10/26/2018   DOE (dyspnea on  exertion) 10/26/2018   Left hip pain 07/14/2018   Acute left ankle pain 07/14/2018   Chest pain 07/14/2018   Acute non-recurrent ethmoidal sinusitis 03/13/2018   Nonintractable headache 02/24/2018   CRI (chronic renal insufficiency), stage 3 (moderate) 02/22/2017   Anemia 02/16/2017   Lower respiratory infection 02/16/2017   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   LBBB (left bundle branch block) 02/02/2016   Nonallopathic lesion of lumbosacral region 12/11/2014   Nonallopathic lesion of sacral region 12/11/2014   Nonallopathic lesion of thoracic region 12/11/2014   Arthritis of left hip 11/20/2014   Ischial bursitis of left side 10/28/2014   Hamstring tightness of left lower extremity 09/16/2014   Piriformis  syndrome of left side 09/16/2014   Dysphonia 06/22/2013   Hemiparesis affecting left side as late effect of stroke (HCC) 10/10/2012   Dysphagia following cerebrovascular accident 08/14/2012   Chronic back pain    Peripheral neuropathy (HCC)    TIA (transient ischemic attack) 04/08/2012   Stroke (HCC) 02/21/2012   MORTON'S NEUROMA, RIGHT 05/29/2010   Poorly controlled type 2 diabetes mellitus with circulatory disorder (HCC) 04/13/2010   ALLERGIC RHINITIS 04/13/2010   GERD 04/13/2010   ERECTILE DYSFUNCTION 03/12/2009   Essential hypertension 03/12/2009   CAD S/P percutaneous coronary angioplasty 03/12/2009   Hyperlipidemia 03/07/2009    ONSET DATE: 03/04/2023 referral  REFERRING DIAG: M54.9 (ICD-10-CM) - Back pain, unspecified back location, unspecified back pain laterality, unspecified chronicity I69.354 (ICD-10-CM) - Hemiparesis affecting left side as late effect of stroke  THERAPY DIAG:  Abnormal posture  Difficulty in walking, not elsewhere classified  Muscle weakness (generalized)  Other lack of coordination  Unsteadiness on feet  Other abnormalities of gait and mobility  Rationale for Evaluation and Treatment: Rehabilitation  SUBJECTIVE:                                                                                                                                                                                             SUBJECTIVE STATEMENT: Patient reports everything is about the same. Patient had a change in type in insulin he is taking. Patient reports some mild edema with starting new insulin and agreeable to call PCP about swelling in order to determine next steps.  Pt accompanied by: self  PERTINENT HISTORY: fall in 1997, CVA 2013  PAIN:  Are you having pain? Yes: NPRS scale: 8/10 Pain location: mid/low back radiating around RLQ/LLQ  Pain description: dull, sharp, tingling on L low back/buttock Aggravating factors: standing up, walking Relieving factors:  none reported *reports pain will wake him up at night  Today's Vitals   04/12/23 1630  BP: 123/66  Pulse: 66   PRECAUTIONS: Fall and Other: L hemiparesis   PATIENT GOALS: "strengthen my body"  OBJECTIVE:   DIAGNOSTIC FINDINGS:  Lumbar MRI 03/27/23 IMPRESSION: No explanation for symptoms. Less than typical degenerative change for age with no neural impingement or visible inflammation.  TODAY'S TREATMENT:                                                                                                                               Vitals:   04/12/23 1630  BP: 123/66  Pulse: 66   TherEx:  -NuStep level 4 B UE/LE x8 mins (able to tolerate without major increase in pain) -prone press up with L leg in hooklying 2 x 10 (tolerates well, reports minor relief of symptoms while performing - clamshell 2 x 10 (reports muscle fatigue burn but also gentle stretch through back, tolerates well) - supine marching 2 x 10   PreTx pain: 8/10 Post Tx pain: 7/10  PATIENT EDUCATION: Education details: continue HEP with updates as noted Person educated: Patient Education method: Explanation Education comprehension: verbalized understanding and needs further education  HOME EXERCISE PROGRAM: Access Code: [email or web address] URL: [email or web address] Date: 04/12/2023 Prepared by: Maryruth Eve  Exercises - Supine Lower Trunk Rotation  - 1 x daily - 7 x weekly - 3 sets - 10 reps - Supine Posterior Pelvic Tilt  - 1 x daily - 7 x weekly - 3 sets - 10 reps - Prone Press Up On Elbows  - 1 x daily - 7 x weekly - 3 sets - 10 reps - Clamshell  - 1 x daily - 7 x weekly - 2 sets - 20 reps  GOALS: Goals reviewed with patient? Yes  SHORT TERM GOALS: Target date: 04/15/23  Pt will be independent with initial HEP for improved functional strength and reduced pain  Baseline: to be provided Goal status: INITIAL  2.  Patient will improve Oswestry score to </= 22 to indicate a reduction in  disability due to LBP Baseline: 26 (severe) Goal status: INITIAL  3.  5x STS Baseline: to be assessed when medically cleared by imaging Goal status: INITIAL  4.  TUG goal Baseline: to be assessed when medically cleared by imaging Goal status: INITIAL   LONG TERM GOALS: Target date: 05/17/23  Pt will be independent with initial HEP for improved functional strength and reduced pain  Baseline: to be provided Goal status: INITIAL  Patient will improve Oswestry score to </= 22 to indicate a reduction in disability due to LBP Baseline: 26 (severe) Goal status: INITIAL  5x STS Baseline: to be assessed when medically cleared by imaging Goal status: INITIAL  TUG goal Baseline: to be assessed when medically cleared by imaging Goal status: INITIAL   ASSESSMENT:  CLINICAL IMPRESSION: Session emphasized work on pain management strategies. Patient decreased sensitivity as compared to last session and was able to respond to reduction in pain from 8/10 to 7/10. Symptomatic report still  concerning for component that may be non-musculoskeletal in nature but given reduction in pain in today's session at least some component appears to be musculoskeletal. Will continue to monitor and proceed as able. Awaiting CT results. Continue POC as able.   OBJECTIVE IMPAIRMENTS: Abnormal gait, decreased activity tolerance, decreased coordination, decreased endurance, decreased knowledge of condition, decreased mobility, difficulty walking, decreased ROM, decreased strength, increased muscle spasms, impaired tone, impaired UE functional use, improper body mechanics, postural dysfunction, and pain.   ACTIVITY LIMITATIONS: carrying, lifting, bending, standing, squatting, sleeping, stairs, bed mobility, bathing, hygiene/grooming, locomotion level, and caring for others  PARTICIPATION LIMITATIONS: meal prep, cleaning, laundry, interpersonal relationship, driving, shopping, community activity, and yard  work  PERSONAL FACTORS: Age, Past/current experiences, Time since onset of injury/illness/exacerbation, Transportation, and 3+ comorbidities: see above  are also affecting patient's functional outcome.   REHAB POTENTIAL: Fair time since onset, unknown reason for exacerbation  CLINICAL DECISION MAKING: Evolving/moderate complexity  EVALUATION COMPLEXITY: Moderate  PLAN:  PT FREQUENCY: 2x/week  PT DURATION: 6 weeks  PLANNED INTERVENTIONS: Therapeutic exercises, Therapeutic activity, Neuromuscular re-education, Balance training, Gait training, Patient/Family education, Self Care, Joint mobilization, Stair training, Vestibular training, Visual/preceptual remediation/compensation, Orthotic/Fit training, DME instructions, Aquatic Therapy, Electrical stimulation, Spinal mobilization, Cryotherapy, Moist heat, Taping, Manual therapy, and Re-evaluation  PLAN FOR NEXT SESSION: results from further testing/imaging? TUG, 5xSTS, HEP progress  Carmelia Bake, PT, DPT 04/12/2023, 7:00 PM

## 2023-04-14 ENCOUNTER — Ambulatory Visit: Payer: Medicare HMO

## 2023-04-19 ENCOUNTER — Ambulatory Visit: Payer: Medicare HMO | Admitting: Physical Therapy

## 2023-04-20 ENCOUNTER — Ambulatory Visit (HOSPITAL_COMMUNITY)
Admission: RE | Admit: 2023-04-20 | Discharge: 2023-04-20 | Disposition: A | Discharge status: Home / Self Care | Payer: Medicare HMO | Source: Ambulatory Visit | Attending: Gastroenterology | Admitting: Gastroenterology

## 2023-04-20 ENCOUNTER — Ambulatory Visit: Payer: Medicare HMO | Admitting: Podiatry

## 2023-04-20 DIAGNOSIS — R1084 Generalized abdominal pain: Secondary | ICD-10-CM

## 2023-04-20 DIAGNOSIS — R194 Change in bowel habit: Secondary | ICD-10-CM | POA: Insufficient documentation

## 2023-04-20 DIAGNOSIS — Z7902 Long term (current) use of antithrombotics/antiplatelets: Secondary | ICD-10-CM | POA: Diagnosis present

## 2023-04-20 DIAGNOSIS — D649 Anemia, unspecified: Secondary | ICD-10-CM | POA: Insufficient documentation

## 2023-04-20 MED ORDER — IOHEXOL 9 MG/ML PO SOLN
1000.0000 mL | ORAL | Status: AC
  Administered 2023-04-20: 1000 mL via ORAL

## 2023-04-20 MED ORDER — IOHEXOL 9 MG/ML PO SOLN
ORAL | Status: AC
  Filled 2023-04-20: qty 1000

## 2023-04-21 ENCOUNTER — Ambulatory Visit: Payer: Medicare HMO

## 2023-04-22 ENCOUNTER — Telehealth: Payer: Self-pay | Admitting: Gastroenterology

## 2023-04-22 NOTE — Telephone Encounter (Signed)
Inbound call from patient requesting to speak about recent imaging results. Please advise, thank you.

## 2023-04-25 NOTE — Telephone Encounter (Signed)
Dr. Myrtie Neither, pt is requesting CT results. He is scheduled for EGD and colonoscopy tomorrow with you in the [facility]. Not sure if you would like to discuss at the time of appt tomorrow. Thanks

## 2023-04-26 ENCOUNTER — Encounter: Payer: Self-pay | Admitting: Gastroenterology

## 2023-04-26 ENCOUNTER — Ambulatory Visit (AMBULATORY_SURGERY_CENTER): Payer: Medicare HMO | Admitting: Gastroenterology

## 2023-04-26 VITALS — BP 114/76 | HR 65 | Temp 98.6°F | Resp 17 | Ht 71.0 in | Wt 211.0 lb

## 2023-04-26 DIAGNOSIS — R1314 Dysphagia, pharyngoesophageal phase: Secondary | ICD-10-CM | POA: Diagnosis not present

## 2023-04-26 DIAGNOSIS — D123 Benign neoplasm of transverse colon: Secondary | ICD-10-CM

## 2023-04-26 DIAGNOSIS — D649 Anemia, unspecified: Secondary | ICD-10-CM

## 2023-04-26 DIAGNOSIS — D124 Benign neoplasm of descending colon: Secondary | ICD-10-CM | POA: Diagnosis present

## 2023-04-26 DIAGNOSIS — R1084 Generalized abdominal pain: Secondary | ICD-10-CM

## 2023-04-26 MED ORDER — SODIUM CHLORIDE 0.9 % IV SOLN
500.0000 mL | Freq: Once | INTRAVENOUS | Status: DC
Start: 2023-04-26 — End: 2023-04-26

## 2023-04-26 NOTE — Op Note (Signed)
[facility] Patient Name: [name] Procedure Date: 04/26/2023 1:48 PM MRN: [id number] Endoscopist: Sherilyn Cooter L. Myrtie Neither , MD, [id number] Age: 72 Referring MD:  Date of Birth: 1951-02-04 Gender: Male Account #: [id number] Procedure:                Colonoscopy Indications:              Anemia (normocytic)                           clinical details in office consult note Medicines:                Monitored Anesthesia Care Procedure:                Pre-Anesthesia Assessment:                           - Prior to the procedure, a History and Physical                            was performed, and patient medications and                            allergies were reviewed. The patient's tolerance of                            previous anesthesia was also reviewed. The risks                            and benefits of the procedure and the sedation                            options and risks were discussed with the patient.                            All questions were answered, and informed consent                            was obtained. Prior Anticoagulants: The patient has                            taken Plavix (clopidogrel), last dose was 5 days                            prior to procedure. ASA Grade Assessment: III - A                            patient with severe systemic disease. After                            reviewing the risks and benefits, the patient was                            deemed in satisfactory condition to undergo the  procedure.                           After obtaining informed consent, the colonoscope                            was passed under direct vision. Throughout the                            procedure, the patient's blood pressure, pulse, and                            oxygen saturations were monitored continuously. The                            Olympus Scope SN [id number] was introduced through the                             anus and advanced to the the cecum, identified by                            appendiceal orifice and ileocecal valve. The                            colonoscopy was performed without difficulty. The                            patient tolerated the procedure well. The quality                            of the bowel preparation was excellent. The                            ileocecal valve, appendiceal orifice, and rectum                            were photographed. Scope In: 2:01:33 PM Scope Out: 2:14:20 PM Scope Withdrawal Time: 0 hours 9 minutes 45 seconds  Total Procedure Duration: 0 hours 12 minutes 47 seconds  Findings:                 The perianal and digital rectal examinations were                            normal.                           A diffuse area of melanosis was found in the entire                            colon.                           Three sessile polyps were found in the descending  colon and transverse colon. The polyps were                            diminutive in size. These polyps were removed with                            a cold snare. Resection and retrieval were complete.                           The exam was otherwise without abnormality on                            direct and retroflexion views. Complications:            No immediate complications. Estimated Blood Loss:     Estimated blood loss was minimal. Impression:               - Melanosis in the colon.                           - Three diminutive polyps in the descending colon                            and in the transverse colon, removed with a cold                            snare. Resected and retrieved.                           - The examination was otherwise normal on direct                            and retroflexion views.                           Patient has also had a few months of lef flank and                            abdominal pain. CTAP  last week unrevealing. No                            findings on today's exams to explain the pain.                            Appears possibly due to flank and abdominal wall                            pain from altered gait and body mechanics in the                            setting of prior CVA with left sided weakness and                            recent back pain.  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 The signs and symptoms of potential                            delayed complications were discussed with the                            patient. Return to normal activities tomorrow.                            Written discharge instructions were provided to the                            patient.                           - Resume previous diet.                           - Continue present medications.                           - Await pathology results.                           - No repeat surveillance colonoscopy recommended                            due to age and current guidelines.                           - See the other procedure note for documentation of                            additional recommendations.                           - Resume Plavix (clopidogrel) at prior dose                            tomorrow. Brennan Litzinger L. Myrtie Neither, MD 04/26/2023 2:33:28 PM This report has been signed electronically.

## 2023-04-26 NOTE — Telephone Encounter (Signed)
I will discuss the results with him today, thank you.  - HD

## 2023-04-26 NOTE — Op Note (Signed)
[facility] Patient Name: [name] Procedure Date: 04/26/2023 1:49 PM MRN: [id number] Endoscopist: Sherilyn Cooter L. Myrtie Neither , MD, [id number] Age: 72 Referring MD:  Date of Birth: 03/22/1951 Gender: Male Account #: [id number] Procedure:                Upper GI endoscopy Indications:              Pharyngeal phase dysphagia, Anemia (normocytic)                           see clinical details in recent office consult note Medicines:                Monitored Anesthesia Care Procedure:                Pre-Anesthesia Assessment:                           - Prior to the procedure, a History and Physical                            was performed, and patient medications and                            allergies were reviewed. The patient's tolerance of                            previous anesthesia was also reviewed. The risks                            and benefits of the procedure and the sedation                            options and risks were discussed with the patient.                            All questions were answered, and informed consent                            was obtained. Prior Anticoagulants: The patient has                            taken Plavix (clopidogrel), last dose was 5 days                            prior to procedure. ASA Grade Assessment: III - A                            patient with severe systemic disease. After                            reviewing the risks and benefits, the patient was                            deemed in satisfactory condition to undergo the  procedure.                           After obtaining informed consent, the endoscope was                            passed under direct vision. Throughout the                            procedure, the patient's blood pressure, pulse, and                            oxygen saturations were monitored continuously. The                            GIF [id number] #[id number] was  introduced through the                            mouth, and advanced to the second part of duodenum.                            The upper GI endoscopy was accomplished without                            difficulty. The patient tolerated the procedure                            well. Scope In: Scope Out: Findings:                 The esophagus was normal.                           The stomach was normal.                           The cardia and gastric fundus were normal on                            retroflexion.                           The examined duodenum was normal. Complications:            No immediate complications. Estimated Blood Loss:     Estimated blood loss: none. Impression:               - Normal esophagus.                           - Normal stomach.                           - Normal examined duodenum.                           - No specimens collected.  Dysphagia believed due to chronic oropharyngeal                            causes from prior CVA and known vocal cord                            dysfunction. Recommendation:           - Patient has a contact number available for                            emergencies. The signs and symptoms of potential                            delayed complications were discussed with the                            patient. Return to normal activities tomorrow.                            Written discharge instructions were provided to the                            patient.                           - Resume previous diet.                           - Resume Plavix (clopidogrel) at prior dose                            tomorrow.                           - See the other procedure note for documentation of                            additional recommendations. Zachery Niswander L. Myrtie Neither, MD 04/26/2023 2:23:33 PM This report has been signed electronically.

## 2023-04-26 NOTE — Patient Instructions (Addendum)
Resume Plavix (clopidogrel) at prior dose tomorrow. Please read handout about polyps- await pathology                            YOU HAD AN ENDOSCOPIC PROCEDURE TODAY AT THE [facility]:   Refer to the procedure report that was given to you for any specific questions about what was found during the examination.  If the procedure report does not answer your questions, please call your gastroenterologist to clarify.  If you requested that your care partner not be given the details of your procedure findings, then the procedure report has been included in a sealed envelope for you to review at your convenience later.  YOU SHOULD EXPECT: Some feelings of bloating in the abdomen. Passage of more gas than usual.  Walking can help get rid of the air that was put into your GI tract during the procedure and reduce the bloating. If you had a lower endoscopy (such as a colonoscopy or flexible sigmoidoscopy) you may notice spotting of blood in your stool or on the toilet paper. If you underwent a bowel prep for your procedure, you may not have a normal bowel movement for a few days.  Please Note:  You might notice some irritation and congestion in your nose or some drainage.  This is from the oxygen used during your procedure.  There is no need for concern and it should clear up in a day or so.  SYMPTOMS TO REPORT IMMEDIATELY:  Following lower endoscopy (colonoscopy or flexible sigmoidoscopy):  Excessive amounts of blood in the stool  Significant tenderness or worsening of abdominal pains  Swelling of the abdomen that is new, acute  Fever of 100F or higher  Following upper endoscopy (EGD)  Vomiting of blood or coffee ground material  New chest pain or pain under the shoulder blades  Painful or persistently difficult swallowing  New shortness of breath  Fever of 100F or higher  Black, tarry-looking stools  For urgent or emergent issues, a gastroenterologist can be reached at any hour by  calling [phone]. Do not use MyChart messaging for urgent concerns.    DIET:  We do recommend a small meal at first, but then you may proceed to your regular diet.  Drink plenty of fluids but you should avoid alcoholic beverages for 24 hours.  ACTIVITY:  You should plan to take it easy for the rest of today and you should NOT DRIVE or use heavy machinery until tomorrow (because of the sedation medicines used during the test).    FOLLOW UP: Our staff will call the number listed on your records the next business day following your procedure.  We will call around 7:15- 8:00 am to check on you and address any questions or concerns that you may have regarding the information given to you following your procedure. If we do not reach you, we will leave a message.     If any biopsies were taken you will be contacted by phone or by letter within the next 1-3 weeks.  Please call us at [phone] if you have not heard about the biopsies in 3 weeks.    SIGNATURES/CONFIDENTIALITY: You and/or your care partner have signed paperwork which will be entered into your electronic medical record.  These signatures attest to the fact that that the information above on your After Visit Summary has been reviewed and is understood.  Full responsibility of the confidentiality  of this discharge information lies with you and/or your care-partner.

## 2023-04-26 NOTE — Progress Notes (Signed)
No changes to clinical history since GI office visit on 03/30/23. CTAP with oral contrast last week showed no inflammatory or neoplastic cause of abdominal pain.  The patient is appropriate for an endoscopic procedure in the ambulatory setting.  - Amada Jupiter, MD

## 2023-04-26 NOTE — Progress Notes (Signed)
Vss nad trans to pacu 

## 2023-04-26 NOTE — Progress Notes (Signed)
Pt's states no medical or surgical changes since previsit or office visit. 

## 2023-04-27 ENCOUNTER — Telehealth: Payer: Self-pay

## 2023-04-27 NOTE — Telephone Encounter (Signed)
  Follow up Call-     04/26/2023    1:34 PM  Call back number  Post procedure Call Back phone  # [phone]  Permission to leave phone message Yes     Patient questions:  Do you have a fever, pain , or abdominal swelling? No. Pain Score  0 *  Have you tolerated food without any problems? Yes.    Have you been able to return to your normal activities? Yes.    Do you have any questions about your discharge instructions: Diet   No. Medications  No. Follow up visit  No.  Do you have questions or concerns about your Care? No.  Actions: * If pain score is 4 or above: No action needed, pain <4.

## 2023-04-29 ENCOUNTER — Encounter: Payer: Self-pay | Admitting: Gastroenterology

## 2023-05-03 ENCOUNTER — Telehealth: Payer: Self-pay | Admitting: Cardiovascular Disease

## 2023-05-03 MED ORDER — ATORVASTATIN CALCIUM 40 MG PO TABS: 40.0000 mg | ORAL_TABLET | Freq: Every day | ORAL | 3 refills | Status: DC

## 2023-05-03 NOTE — Telephone Encounter (Signed)
?*  STAT* If patient is at the pharmacy, call can be transferred to refill team. ? ? ?1. Which medications need to be refilled? (please list name of each medication and dose if known) \atorvastatin (LIPITOR) 40 MG tablet ? ?2. Which pharmacy/location (including street and city if local pharmacy) is medication to be sent to?[facility] - [place], [place] - [address] ? ?3. Do they need a 30 day or 90 day supply? 90 ? ?

## 2023-05-04 ENCOUNTER — Encounter: Payer: Self-pay | Admitting: Podiatry

## 2023-05-04 ENCOUNTER — Ambulatory Visit: Payer: Medicare HMO | Admitting: Podiatry

## 2023-05-04 ENCOUNTER — Ambulatory Visit (INDEPENDENT_AMBULATORY_CARE_PROVIDER_SITE_OTHER): Payer: Medicare HMO | Admitting: Podiatry

## 2023-05-04 DIAGNOSIS — M79674 Pain in right toe(s): Secondary | ICD-10-CM | POA: Diagnosis not present

## 2023-05-04 DIAGNOSIS — M79675 Pain in left toe(s): Secondary | ICD-10-CM

## 2023-05-04 DIAGNOSIS — E1142 Type 2 diabetes mellitus with diabetic polyneuropathy: Secondary | ICD-10-CM

## 2023-05-04 DIAGNOSIS — B351 Tinea unguium: Secondary | ICD-10-CM

## 2023-05-04 NOTE — Progress Notes (Signed)
This patient returns to my office for at risk foot care.  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  Patient is taking plavix.  Patient has history of CVA.  This patient is unable to cut nails himself since the patient cannot reach his nails.These nails are painful walking and wearing shoes. This patient presents for at risk foot care today.  General Appearance  Alert, conversant and in no acute stress.  Vascular  Dorsalis pedis and posterior tibial  pulses are weakly  palpable  bilaterally.  Capillary return is within normal limits  Bilaterally.Cold feet left.  .  Absent digital hair  B/L.  Neurologic  Senn-Weinstein monofilament wire test diminished  bilaterally. Muscle power within normal limits bilaterally.  Nails Thick disfigured discolored nails with subungual debris  from hallux to fifth toes bilaterally. No evidence of bacterial infection or drainage bilaterally.  Orthopedic  No limitations of motion  feet .  No crepitus or effusions noted.  No bony pathology or digital deformities noted.  Skin  normotropic skin with no porokeratosis noted bilaterally.  No signs of infections or ulcers noted.  Midfoot DJD left..  Hammer toes  B/L.   Onychomycosis  Pain in right toes  Pain in left toes  Neuropathic pain.  Consent was obtained for treatment procedures.   Mechanical debridement of nails 1-5  bilaterally performed with a nail nipper.  Filed with dremel without incident.     Return office visit   3 months                   Told patient to return for periodic foot care and evaluation due to potential at risk complications.   Helane Gunther DPM

## 2023-06-14 ENCOUNTER — Encounter (INDEPENDENT_AMBULATORY_CARE_PROVIDER_SITE_OTHER): Payer: Medicare HMO | Admitting: Ophthalmology

## 2023-06-14 DIAGNOSIS — H35373 Puckering of macula, bilateral: Secondary | ICD-10-CM | POA: Diagnosis not present

## 2023-06-14 DIAGNOSIS — I10 Essential (primary) hypertension: Secondary | ICD-10-CM

## 2023-06-14 DIAGNOSIS — H35033 Hypertensive retinopathy, bilateral: Secondary | ICD-10-CM

## 2023-06-14 DIAGNOSIS — Z794 Long term (current) use of insulin: Secondary | ICD-10-CM

## 2023-06-14 DIAGNOSIS — E113293 Type 2 diabetes mellitus with mild nonproliferative diabetic retinopathy without macular edema, bilateral: Secondary | ICD-10-CM | POA: Diagnosis not present

## 2023-06-14 DIAGNOSIS — H43813 Vitreous degeneration, bilateral: Secondary | ICD-10-CM

## 2023-06-14 DIAGNOSIS — H353122 Nonexudative age-related macular degeneration, left eye, intermediate dry stage: Secondary | ICD-10-CM

## 2023-06-15 ENCOUNTER — Encounter (INDEPENDENT_AMBULATORY_CARE_PROVIDER_SITE_OTHER): Payer: Medicare HMO | Admitting: Ophthalmology

## 2023-06-27 ENCOUNTER — Ambulatory Visit (INDEPENDENT_AMBULATORY_CARE_PROVIDER_SITE_OTHER): Payer: Medicare HMO | Admitting: Cardiovascular Disease

## 2023-06-27 ENCOUNTER — Encounter (HOSPITAL_BASED_OUTPATIENT_CLINIC_OR_DEPARTMENT_OTHER): Payer: Self-pay | Admitting: Cardiovascular Disease

## 2023-06-27 VITALS — BP 144/80 | HR 63 | Ht 71.0 in | Wt 214.7 lb

## 2023-06-27 DIAGNOSIS — G4733 Obstructive sleep apnea (adult) (pediatric): Secondary | ICD-10-CM

## 2023-06-27 DIAGNOSIS — I2510 Atherosclerotic heart disease of native coronary artery without angina pectoris: Secondary | ICD-10-CM

## 2023-06-27 DIAGNOSIS — G459 Transient cerebral ischemic attack, unspecified: Secondary | ICD-10-CM | POA: Diagnosis not present

## 2023-06-27 DIAGNOSIS — Z9861 Coronary angioplasty status: Secondary | ICD-10-CM

## 2023-06-27 DIAGNOSIS — E1159 Type 2 diabetes mellitus with other circulatory complications: Secondary | ICD-10-CM

## 2023-06-27 DIAGNOSIS — Z794 Long term (current) use of insulin: Secondary | ICD-10-CM

## 2023-06-27 DIAGNOSIS — I6330 Cerebral infarction due to thrombosis of unspecified cerebral artery: Secondary | ICD-10-CM | POA: Diagnosis not present

## 2023-06-27 DIAGNOSIS — I447 Left bundle-branch block, unspecified: Secondary | ICD-10-CM

## 2023-06-27 DIAGNOSIS — I10 Essential (primary) hypertension: Secondary | ICD-10-CM

## 2023-06-27 DIAGNOSIS — E1165 Type 2 diabetes mellitus with hyperglycemia: Secondary | ICD-10-CM

## 2023-06-27 DIAGNOSIS — I5042 Chronic combined systolic (congestive) and diastolic (congestive) heart failure: Secondary | ICD-10-CM

## 2023-06-27 NOTE — Patient Instructions (Addendum)
Medication Instructions:  Your physician recommends that you continue on your current medications as directed. Please refer to the Current Medication list given to you today.   *If you need a refill on your cardiac medications before your next appointment, please call your pharmacy*  Lab Work: NONE  Testing/Procedures: NONE  Follow-Up: At [facility], you and your health needs are our priority.  As part of our continuing mission to provide you with exceptional heart care, we have created designated Provider Care Teams.  These Care Teams include your primary Cardiologist (physician) and Advanced Practice Providers (APPs -  Physician Assistants and Nurse Practitioners) who all work together to provide you with the care you need, when you need it.  We recommend signing up for the patient portal called "MyChart".  Sign up information is provided on this After Visit Summary.  MyChart is used to connect with patients for Virtual Visits (Telemedicine).  Patients are able to view lab/test results, encounter notes, upcoming appointments, etc.  Non-urgent messages can be sent to your provider as well.   To learn more about what you can do with MyChart, go to [email or web address].    Your next appointment:   1 month(s)  Provider:   Gillian Shields, NP  OR KRI[facility] PHARM D  [facility] [place] IN 6 MONTHS   OTHER:  MONITOR YOUR BLOOD PRESSURE 1 TO 2 TIMES A DAY AND LOG. BRING YOUR READINGS AND MACHINE TO YOUR FOLLOW UP APPOINTMENT

## 2023-06-27 NOTE — Progress Notes (Signed)
Cardiology Office Note:  .    Date:  06/27/2023  ID:  [name], DOB July 16, 1951, MRN [id number] PCP: Pincus Sanes, MD  [facility] Providers Cardiologist:  Chilton Si, MD     History of Present Illness: .    [name] is a 72 y.o. male with hypertension, CAD status post multiple PCI, CVA, diabetes mellitus type 2, hyperlipidemia, OSA, and prior tobacco abuse who presents for follow up.  [name] was referred by Dr. Cheryll Walker on 02/09/16. [name] had PCI with a BMS to OM while living in [place].  He had in-stent restenosis in 2005 and had a DES placed.   At that time he reported occasional episodes of chest pain relieved by aspirin.  He denied exertional chest pain but did note dizziness and nausea.  Additionally, he was noted to have a left bundle branch block that was not present on his last EKG in 2013. Therefore, he was referred for exercise Myoview 02/13/16 that revealed LVEF 39% with global hypokinesis but no ischemia.  Echo 03/01/16 revealed LV 40-45% with mild LVH and grade 2 diastolic dysfunction. There was also mild hypokinesis of the mid to apical anteroseptum.   He worked with our pharmacists and had achieved very good blood pressure control.  He is in a program at the [place] that checks his BP daily.     Mr. Leppanen was seen in the ED 06/2020 with NSTEMI.  He underwent cardiac catheterization.  His cath 8/19 revealed 95% stenosis of the ostial OM 2 just proximal to his prior OM stent.  A drug-eluting stent was successfully placed.  In follow-up he continued to be much better.  He had a repeat echo 07/2020 that revealed an improvement in his LVEF to 55% with grade 1 diastolic dysfunction.  Right atrial pressure was 3 mmHg.  He was referred for sleep study and found to have severe sleep apnea.     [name] was doing well at his last visit 07/2021.  He did note increased diaphoresis with exercise.  He had a Lexiscan Myoview that revealed LVEF 42% with anterior  and apical hypokinesis.  There was also a fixed inferior and apical defect consistent with prior infarct.  There was no ischemia.  At his visit 12/2022 he continued to report exertional dyspnea.  Plans were made for left heart cath.  However in the precath labs he was noted to be significantly anemic.  He also reported having dark stools.  Upper and lower endoscopy 04/2023 revealed polyps which were resected.  He was okay to resume clopidogrel.  Nuclear stress revealed ischemia but no infarct.  He is followed up in hypertension clinic and last saw Gillian Shields, NP 03/2023.  He was out of losartan for the preceding 2 days.  Losartan was refilled with plans to transition to valsartan if blood pressure remained uncontrolled.  Today, he complains of left arm and shoulder tightness ongoing since at least 12/2022, with constant pain that waxes and wanes. Significant tenderness not reproducible on exam and no arm rash is noted. No chest pain or pressure. He complains of intermittently sporadic swelling in his left leg, only sometimes in the right leg. Doesn't always improve by mornings. At times he loses his balance as it feels like he is standing on a ball on his left foot. No recent falls. He does have Lasix to take prn, which he hasn't needed lately. [facility]  Initially his blood pressure is 138/74, and 144/80 on manual recheck. He hasn't taken his antihypertensives. Usually he takes them around the time of this visit now. Reportedly his blood pressure has been more controlled at other clinic visits. A few weeks ago he had a Covid-19 infection; he states he has recovered well at this time. Exercise has been difficult mostly from struggling with motivation to use his home exercise bike. He denies any palpitations, chest pain, shortness of breath, lightheadedness, headaches, syncope, orthopnea, or PND. No hematochezia or melena.  ROS:  Please see the history  of present illness. All other systems are reviewed and negative.  (+) Left arm and shoulder tightness/pain (+) Sporadic LE edema, L>R  Studies Reviewed: .        Echo  Feb 25, 2023:  1. Left ventricular ejection fraction, by estimation, is 55%. The left  ventricle has normal function. The left ventricle has no regional wall  motion abnormalities. There is mild asymmetric left ventricular  hypertrophy of the basal-septal segment. Left  ventricular diastolic parameters are consistent with Grade I diastolic  dysfunction (impaired relaxation). There is septal-lateral dyssynchrony  secondary to LBBB.   2. Right ventricular systolic function is normal. The right ventricular  size is normal. There is normal pulmonary artery systolic pressure. The  estimated right ventricular systolic pressure is 22.2 mmHg.   3. The mitral valve is normal in structure. Trivial mitral valve  regurgitation.   4. The aortic valve is tricuspid. There is mild calcification of the  aortic valve. There is mild thickening of the aortic valve. Aortic valve  regurgitation is not visualized.   5. The inferior vena cava is normal in size with greater than 50%  respiratory variability, suggesting right atrial pressure of 3 mmHg.   Comparison(s): No significant change from prior study.   Risk Assessment/Calculations:     HYPERTENSION CONTROL Vitals:   06/27/23 1055 06/27/23 1129  BP: 138/74 (!) 144/80    The patient's blood pressure is elevated above target today.  In order to address the patient's elevated BP: Blood pressure will be monitored at home to determine if medication changes need to be made.; A referral to the [facility] will be placed.          Physical Exam:    VS:  BP (!) 144/80 (BP Location: Right Arm, Patient Position: Sitting, Cuff Size: Large)   Pulse 63   Ht 5\' 11"  (1.803 m)   Wt 214 lb 11.2 oz (97.4 kg)   SpO2 99%   BMI 29.94 kg/m  , BMI Body mass index is 29.94  kg/m. GENERAL:  Well appearing HEENT: Pupils equal round and reactive, fundi not visualized, oral mucosa unremarkable NECK:  No jugular venous distention, waveform within normal limits, carotid upstroke brisk and symmetric, no bruits, no thyromegaly LUNGS:  Clear to auscultation bilaterally HEART:  RRR.  PMI not displaced or sustained,S1 and S2 within normal limits, no S3, no S4, no clicks, no rubs, no murmurs ABD:  Flat, positive bowel sounds normal in frequency in pitch, no bruits, no rebound, no guarding, no midline pulsatile mass, no hepatomegaly, no splenomegaly EXT:  2 plus pulses throughout, no edema, no cyanosis no clubbing SKIN:  No rashes no nodules NEURO:  Cranial nerves II through XII grossly intact, motor grossly intact throughout.  +L UE/LE weakness PSYCH:  Cognitively intact, oriented to person place and time  Wt Readings from Last 3 Encounters:  06/27/23 214 lb 11.2 oz (97.4 kg)  04/26/23  211 lb (95.7 kg)  04/05/23 212 lb 6.4 oz (96.3 kg)     ASSESSMENT AND PLAN: .       # CAD s/p PCI:  Multiple stents on lifelong DAPT>  Last stent 06/2020.  He has no angina.  Encouraged him to increase exercise.  Continue aspirin, clopidogrel, atorvastatin and carvedilol.   # Hypertension Blood pressure slightly elevated at the visit (144/80), but patient had not yet taken daily medication. -Continue current regimen of carvedilol and losartan. -Check blood pressure at home a couple of times a day to monitor. BP goal <130/80.  # Hyperlipidemia LDL cholesterol well-controlled at 38. -Continue atorvastatin.  # Edema Intermittent swelling in the legs, predominantly on the left side.  None today. -Continue furosemide as needed.   # Chronic Pain Persistent pain in the left side of the body, possibly nerve-related. Intensity varies but is constant. MRI results were clear. -Consider re-engaging with physical therapy now that MRI results are clear.  General Health  Maintenance Encouraged to increase physical activity for overall health and potential reduction of medication needs. -Set small goals for daily exercise, such as 5 minutes on the home exercise bike.  Follow-up Schedule appointment in a couple of months with either Baxter Hire or Caitlin to check in on blood pressure. Plan for a six-month follow-up with the doctor.              Dispo:  FU with APP/PharmD in 2 months.  C. Duke Salvia, MD, [facility] in 8 months.  I,Mathew Stumpf,acting as a Neurosurgeon for Chilton Si, MD.,have documented all relevant documentation on the behalf of Chilton Si, MD,as directed by  Chilton Si, MD while in the presence of Chilton Si, MD.  I,  C. Duke Salvia, MD have reviewed all documentation for this visit.  The documentation of the exam, diagnosis, procedures, and orders on 06/27/2023 are all accurate and complete.   Signed, Chilton Si, MD

## 2023-06-27 NOTE — Progress Notes (Deleted)
Cardiology Office Note:  .   Date:  06/27/2023  ID:  [name], DOB 1951-01-03, MRN [id number] PCP: Pincus Sanes, MD  [facility] Providers Cardiologist:  Chilton Si, MD { Click to update primary MD,subspecialty MD or APP then REFRESH:1}   History of Present Illness: .   [name] is a 72 y.o. male  with hypertension, CAD status post multiple PCI, CVA, diabetes mellitus type 2, hyperlipidemia, OSA, and prior tobacco abuse who presents for follow up.  [name] was referred by Dr. Cheryll Cockayne on 02/09/16. [name] had PCI with a BMS to OM while living in [place].  He had in-stent restenosis in 2005 and had a DES placed.   At that time he reported occasional episodes of chest pain relieved by aspirin.  He denied exertional chest pain but did note dizziness and nausea.  Additionally, he was noted to have a left bundle branch block that was not present on his last EKG in 2013. Therefore, he was referred for exercise Myoview 02/13/16 that revealed LVEF 39% with global hypokinesis but no ischemia.  Echo 03/01/16 revealed LV 40-45% with mild LVH and grade 2 diastolic dysfunction. There was also mild hypokinesis of the mid to apical anteroseptum.   He worked with our pharmacists and had achieved very good blood pressure control.  He is in a program at the [place] that checks his BP daily.     [name] was seen in the ED 06/2020 with NSTEMI.  He underwent cardiac catheterization.  His cath 8/19 revealed 95% stenosis of the ostial OM 2 just proximal to his prior OM stent.  A drug-eluting stent was successfully placed.  In follow-up he continued to be much better.  He had a repeat echo 07/2020 that revealed an improvement in his LVEF to 55% with grade 1 diastolic dysfunction.  Right atrial pressure was 3 mmHg.  He was referred for sleep study and found to have severe sleep apnea.     [name] was doing well at his last visit 07/2021.  He did note increased diaphoresis with exercise.   He had a Lexiscan Myoview that revealed LVEF 42% with anterior and apical hypokinesis.  There was also a fixed inferior and apical defect consistent with prior infarct.  There was no ischemia.  At his visit 12/2022 he continued to report exertional dyspnea.  Plans were made for left heart cath.  However in the precath labs he was noted to be significantly anemic.  He also reported having dark stools.  Upper and lower endoscopy 04/2023 revealed polyps which were resected.  He was okay to resume clopidogrel.  Nuclear stress revealed ischemia but no infarct.  He is followed up in hypertension clinic and last saw Gillian Shields, NP 03/2023.  He was out of losartan for the preceding 2 days.  Losartan was refilled with plans to transition to valsartan if blood pressure remained uncontrolled.  ROS: ***  Studies Reviewed: .        *** Risk Assessment/Calculations:   {Does this patient have ATRIAL FIBRILLATION?:[phone]}         Physical Exam:   VS:  BP 138/74 (BP Location: Right Arm, Patient Position: Sitting, Cuff Size: Large)   Pulse 63   Ht 5\' 11"  (1.803 m)   Wt 214 lb 11.2 oz (97.4 kg)   SpO2 99%   BMI 29.94 kg/m    Wt Readings from Last 3 Encounters:  06/27/23 214 lb 11.2 oz (97.4 kg)  04/26/23  211 lb (95.7 kg)  04/05/23 212 lb 6.4 oz (96.3 kg)    GEN: Well nourished, well developed in no acute distress NECK: No JVD; No carotid bruits CARDIAC: ***RRR, no murmurs, rubs, gallops RESPIRATORY:  Clear to auscultation without rales, wheezing or rhonchi  ABDOMEN: Soft, non-tender, non-distended EXTREMITIES:  No edema; No deformity   ASSESSMENT AND PLAN: .   ***    {Are you ordering a CV Procedure (e.g. stress test, cath, DCCV, TEE, etc)?   Press F2        :[id number]}  Dispo: ***  Signed, Chilton Si, MD

## 2023-07-14 ENCOUNTER — Other Ambulatory Visit: Payer: Self-pay

## 2023-07-14 ENCOUNTER — Observation Stay (HOSPITAL_COMMUNITY)
Admission: EM | Admit: 2023-07-14 | Discharge: 2023-07-16 | Disposition: A | Discharge status: Home / Self Care | Payer: Medicare HMO | Source: Home / Self Care | Attending: Emergency Medicine | Admitting: Emergency Medicine

## 2023-07-14 ENCOUNTER — Emergency Department (HOSPITAL_COMMUNITY): Payer: Medicare HMO

## 2023-07-14 ENCOUNTER — Encounter (HOSPITAL_COMMUNITY): Payer: Self-pay

## 2023-07-14 DIAGNOSIS — R1013 Epigastric pain: Secondary | ICD-10-CM | POA: Diagnosis present

## 2023-07-14 DIAGNOSIS — D638 Anemia in other chronic diseases classified elsewhere: Secondary | ICD-10-CM | POA: Diagnosis present

## 2023-07-14 DIAGNOSIS — Z7982 Long term (current) use of aspirin: Secondary | ICD-10-CM | POA: Diagnosis not present

## 2023-07-14 DIAGNOSIS — I152 Hypertension secondary to endocrine disorders: Secondary | ICD-10-CM | POA: Diagnosis present

## 2023-07-14 DIAGNOSIS — Z8673 Personal history of transient ischemic attack (TIA), and cerebral infarction without residual deficits: Secondary | ICD-10-CM | POA: Diagnosis not present

## 2023-07-14 DIAGNOSIS — Z79899 Other long term (current) drug therapy: Secondary | ICD-10-CM | POA: Diagnosis not present

## 2023-07-14 DIAGNOSIS — K219 Gastro-esophageal reflux disease without esophagitis: Secondary | ICD-10-CM | POA: Diagnosis present

## 2023-07-14 DIAGNOSIS — E785 Hyperlipidemia, unspecified: Secondary | ICD-10-CM | POA: Diagnosis present

## 2023-07-14 DIAGNOSIS — E119 Type 2 diabetes mellitus without complications: Secondary | ICD-10-CM

## 2023-07-14 DIAGNOSIS — Z794 Long term (current) use of insulin: Secondary | ICD-10-CM | POA: Diagnosis not present

## 2023-07-14 DIAGNOSIS — Z1152 Encounter for screening for COVID-19: Secondary | ICD-10-CM | POA: Diagnosis not present

## 2023-07-14 DIAGNOSIS — R112 Nausea with vomiting, unspecified: Secondary | ICD-10-CM | POA: Diagnosis present

## 2023-07-14 DIAGNOSIS — N1832 Chronic kidney disease, stage 3b: Secondary | ICD-10-CM | POA: Diagnosis not present

## 2023-07-14 DIAGNOSIS — Z87891 Personal history of nicotine dependence: Secondary | ICD-10-CM | POA: Insufficient documentation

## 2023-07-14 DIAGNOSIS — D631 Anemia in chronic kidney disease: Secondary | ICD-10-CM | POA: Insufficient documentation

## 2023-07-14 DIAGNOSIS — I2510 Atherosclerotic heart disease of native coronary artery without angina pectoris: Secondary | ICD-10-CM | POA: Diagnosis not present

## 2023-07-14 DIAGNOSIS — E1122 Type 2 diabetes mellitus with diabetic chronic kidney disease: Secondary | ICD-10-CM | POA: Diagnosis not present

## 2023-07-14 DIAGNOSIS — Z955 Presence of coronary angioplasty implant and graft: Secondary | ICD-10-CM | POA: Diagnosis not present

## 2023-07-14 DIAGNOSIS — I10 Essential (primary) hypertension: Secondary | ICD-10-CM

## 2023-07-14 DIAGNOSIS — E1169 Type 2 diabetes mellitus with other specified complication: Secondary | ICD-10-CM | POA: Diagnosis present

## 2023-07-14 DIAGNOSIS — E782 Mixed hyperlipidemia: Secondary | ICD-10-CM

## 2023-07-14 DIAGNOSIS — I130 Hypertensive heart and chronic kidney disease with heart failure and stage 1 through stage 4 chronic kidney disease, or unspecified chronic kidney disease: Secondary | ICD-10-CM | POA: Diagnosis not present

## 2023-07-14 DIAGNOSIS — R7401 Elevation of levels of liver transaminase levels: Secondary | ICD-10-CM | POA: Diagnosis present

## 2023-07-14 DIAGNOSIS — I5032 Chronic diastolic (congestive) heart failure: Secondary | ICD-10-CM | POA: Diagnosis not present

## 2023-07-14 LAB — CBC WITH DIFFERENTIAL/PLATELET
Abs Immature Granulocytes: 0.02 10*3/uL (ref 0.00–0.07)
Basophils Absolute: 0 10*3/uL (ref 0.0–0.1)
Basophils Relative: 0 %
Eosinophils Absolute: 0.1 10*3/uL (ref 0.0–0.5)
Eosinophils Relative: 1 %
HCT: 32 % — ABNORMAL LOW (ref 39.0–52.0)
Hemoglobin: 10.6 g/dL — ABNORMAL LOW (ref 13.0–17.0)
Immature Granulocytes: 0 %
Lymphocytes Relative: 17 %
Lymphs Abs: 1.4 10*3/uL (ref 0.7–4.0)
MCH: 31.4 pg (ref 26.0–34.0)
MCHC: 33.1 g/dL (ref 30.0–36.0)
MCV: 94.7 fL (ref 80.0–100.0)
Monocytes Absolute: 0.6 10*3/uL (ref 0.1–1.0)
Monocytes Relative: 7 %
Neutro Abs: 6.3 10*3/uL (ref 1.7–7.7)
Neutrophils Relative %: 75 %
Platelets: 151 10*3/uL (ref 150–400)
RBC: 3.38 MIL/uL — ABNORMAL LOW (ref 4.22–5.81)
RDW: 12.3 % (ref 11.5–15.5)
WBC: 8.4 10*3/uL (ref 4.0–10.5)
nRBC: 0 % (ref 0.0–0.2)

## 2023-07-14 LAB — URINALYSIS, W/ REFLEX TO CULTURE (INFECTION SUSPECTED)
Bilirubin Urine: NEGATIVE
Glucose, UA: 50 mg/dL — AB
Hgb urine dipstick: NEGATIVE
Ketones, ur: NEGATIVE mg/dL
Leukocytes,Ua: NEGATIVE
Nitrite: NEGATIVE
Protein, ur: 30 mg/dL — AB
Specific Gravity, Urine: 1.017 (ref 1.005–1.030)
pH: 5 (ref 5.0–8.0)

## 2023-07-14 LAB — HEMOGLOBIN A1C
Hgb A1c MFr Bld: 8.3 % — ABNORMAL HIGH (ref 4.8–5.6)
Mean Plasma Glucose: 191.51 mg/dL

## 2023-07-14 LAB — COMPREHENSIVE METABOLIC PANEL
ALT: 89 U/L — ABNORMAL HIGH (ref 0–44)
AST: 134 U/L — ABNORMAL HIGH (ref 15–41)
Albumin: 3.6 g/dL (ref 3.5–5.0)
Alkaline Phosphatase: 78 U/L (ref 38–126)
Anion gap: 12 (ref 5–15)
BUN: 28 mg/dL — ABNORMAL HIGH (ref 8–23)
CO2: 24 mmol/L (ref 22–32)
Calcium: 9.4 mg/dL (ref 8.9–10.3)
Chloride: 102 mmol/L (ref 98–111)
Creatinine, Ser: 1.99 mg/dL — ABNORMAL HIGH (ref 0.61–1.24)
GFR, Estimated: 35 mL/min — ABNORMAL LOW (ref >60)
Glucose, Bld: 216 mg/dL — ABNORMAL HIGH (ref 70–99)
Potassium: 4.1 mmol/L (ref 3.5–5.1)
Sodium: 138 mmol/L (ref 135–145)
Total Bilirubin: 1.5 mg/dL — ABNORMAL HIGH (ref 0.3–1.2)
Total Protein: 7.4 g/dL (ref 6.5–8.1)

## 2023-07-14 LAB — ACETAMINOPHEN LEVEL: Acetaminophen (Tylenol), Serum: <10 ug/mL — ABNORMAL LOW (ref 10–30)

## 2023-07-14 LAB — TROPONIN I (HIGH SENSITIVITY)
Troponin I (High Sensitivity): 9 ng/L (ref <18)
Troponin I (High Sensitivity): 8 ng/L (ref <18)

## 2023-07-14 LAB — CK: Total CK: 98 U/L (ref 49–397)

## 2023-07-14 LAB — TSH: TSH: 1.973 u[IU]/mL (ref 0.350–4.500)

## 2023-07-14 LAB — LIPASE, BLOOD: Lipase: 34 U/L (ref 11–51)

## 2023-07-14 LAB — MAGNESIUM: Magnesium: 1.5 mg/dL — ABNORMAL LOW (ref 1.7–2.4)

## 2023-07-14 LAB — CBG MONITORING, ED: Glucose-Capillary: 201 mg/dL — ABNORMAL HIGH (ref 70–99)

## 2023-07-14 LAB — HEPATITIS PANEL, ACUTE
HCV Ab: NONREACTIVE
Hep A IgM: NONREACTIVE
Hep B C IgM: NONREACTIVE
Hepatitis B Surface Ag: NONREACTIVE

## 2023-07-14 LAB — ETHANOL: Alcohol, Ethyl (B): <10 mg/dL (ref <10)

## 2023-07-14 LAB — GLUCOSE, CAPILLARY: Glucose-Capillary: 167 mg/dL — ABNORMAL HIGH (ref 70–99)

## 2023-07-14 LAB — SARS CORONAVIRUS 2 BY RT PCR: SARS Coronavirus 2 by RT PCR: NEGATIVE

## 2023-07-14 MED ORDER — LORAZEPAM 2 MG/ML IJ SOLN: 1.0000 mg | INTRAMUSCULAR | Status: DC | PRN

## 2023-07-14 MED ORDER — SODIUM CHLORIDE 0.9 % IV BOLUS
500.0000 mL | Freq: Once | INTRAVENOUS | Status: AC
  Administered 2023-07-14: 500 mL via INTRAVENOUS

## 2023-07-14 MED ORDER — IOHEXOL 350 MG/ML SOLN
65.0000 mL | Freq: Once | INTRAVENOUS | Status: AC | PRN
  Administered 2023-07-14: 65 mL via INTRAVENOUS

## 2023-07-14 MED ORDER — ASPIRIN 81 MG PO TBEC
81.0000 mg | DELAYED_RELEASE_TABLET | Freq: Every day | ORAL | Status: DC
  Administered 2023-07-15 – 2023-07-16 (×2): 81 mg via ORAL
  Filled 2023-07-14 (×2): qty 1

## 2023-07-14 MED ORDER — ACETAMINOPHEN 650 MG RE SUPP: 650.0000 mg | Freq: Four times a day (QID) | RECTAL | Status: DC | PRN

## 2023-07-14 MED ORDER — MELATONIN 3 MG PO TABS: 3.0000 mg | ORAL_TABLET | Freq: Every evening | ORAL | Status: DC | PRN

## 2023-07-14 MED ORDER — FENTANYL CITRATE PF 50 MCG/ML IJ SOSY: 25.0000 ug | PREFILLED_SYRINGE | INTRAMUSCULAR | Status: DC | PRN

## 2023-07-14 MED ORDER — SODIUM CHLORIDE 0.9 % IV SOLN: 12.5000 mg | Freq: Four times a day (QID) | INTRAVENOUS | Status: DC | PRN

## 2023-07-14 MED ORDER — INSULIN ASPART 100 UNIT/ML IJ SOLN
0.0000 [IU] | Freq: Three times a day (TID) | INTRAMUSCULAR | Status: DC
  Administered 2023-07-15: 1 [IU] via SUBCUTANEOUS
  Administered 2023-07-15 – 2023-07-16 (×3): 2 [IU] via SUBCUTANEOUS
  Administered 2023-07-16: 3 [IU] via SUBCUTANEOUS

## 2023-07-14 MED ORDER — INSULIN GLARGINE-YFGN 100 UNIT/ML ~~LOC~~ SOLN
7.0000 [IU] | Freq: Every day | SUBCUTANEOUS | Status: DC
  Administered 2023-07-15: 7 [IU] via SUBCUTANEOUS
  Filled 2023-07-14 (×3): qty 0.07

## 2023-07-14 MED ORDER — CARVEDILOL 25 MG PO TABS
25.0000 mg | ORAL_TABLET | Freq: Two times a day (BID) | ORAL | Status: DC
  Administered 2023-07-15 – 2023-07-16 (×3): 25 mg via ORAL
  Filled 2023-07-14 (×3): qty 1

## 2023-07-14 MED ORDER — PANTOPRAZOLE SODIUM 40 MG IV SOLR
40.0000 mg | INTRAVENOUS | Status: DC
  Administered 2023-07-14 – 2023-07-15 (×2): 40 mg via INTRAVENOUS
  Filled 2023-07-14 (×2): qty 10

## 2023-07-14 MED ORDER — NALOXONE HCL 0.4 MG/ML IJ SOLN: 0.4000 mg | INTRAMUSCULAR | Status: DC | PRN

## 2023-07-14 MED ORDER — CLOPIDOGREL BISULFATE 75 MG PO TABS
75.0000 mg | ORAL_TABLET | Freq: Every day | ORAL | Status: DC
  Administered 2023-07-15 – 2023-07-16 (×2): 75 mg via ORAL
  Filled 2023-07-14 (×2): qty 1

## 2023-07-14 MED ORDER — LOSARTAN POTASSIUM 50 MG PO TABS
100.0000 mg | ORAL_TABLET | Freq: Every day | ORAL | Status: DC
  Administered 2023-07-15 – 2023-07-16 (×2): 100 mg via ORAL
  Filled 2023-07-14 (×2): qty 2

## 2023-07-14 MED ORDER — ACETAMINOPHEN 325 MG PO TABS: 650.0000 mg | ORAL_TABLET | Freq: Four times a day (QID) | ORAL | Status: DC | PRN

## 2023-07-14 MED ORDER — METOCLOPRAMIDE HCL 5 MG/ML IJ SOLN
5.0000 mg | Freq: Once | INTRAMUSCULAR | Status: AC
  Administered 2023-07-14: 5 mg via INTRAVENOUS
  Filled 2023-07-14: qty 2

## 2023-07-14 MED ORDER — LORAZEPAM 2 MG/ML IJ SOLN
1.0000 mg | Freq: Once | INTRAMUSCULAR | Status: AC
  Administered 2023-07-14: 1 mg via INTRAVENOUS
  Filled 2023-07-14: qty 1

## 2023-07-14 MED ORDER — LACTATED RINGERS IV SOLN: INTRAVENOUS | Status: DC

## 2023-07-14 NOTE — ED Provider Notes (Signed)
[place] EMERGENCY DEPARTMENT AT [facility] Provider Note   CSN: [id number] Arrival date & time: 07/14/23  1205     History  Chief Complaint  Patient presents with   Chest Pain   Abdominal Pain   Nausea    [name] is a 72 y.o. male.  HPI 72 year old male presents with back pain, chest pain, abdominal pain.  Symptoms started last night with a little bit of chest burning.  This morning his symptoms were worse and now he is having chest burning, abdominal pain, vomiting, diarrhea and left low back pain.  Has a prior stroke but no new weakness, especially in his legs.  He does feel short of breath this morning.  He does remember what previous cardiac type pain feels like so is not sure if this is similar.  He has vomited a couple times and had 1 episode of loose stool but no blood.  The pain is primarily inferior chest/upper abdomen.  Home Medications Prior to Admission medications   Medication Sig Start Date End Date Taking? Authorizing Provider  aspirin EC 81 MG tablet Take 1 tablet (81 mg total) by mouth daily. Swallow whole. 07/10/20   Dunn, Tacey Ruiz, PA-C  atorvastatin (LIPITOR) 40 MG tablet Take 1 tablet (40 mg total) by mouth daily. 05/03/23   Alver Sorrow, NP  Blood Pressure Monitoring (BLOOD PRESSURE CUFF) MISC 1 Units by Does not apply route daily. 02/03/21   Chilton Si, MD  calcium carbonate (TUMS - DOSED IN MG ELEMENTAL CALCIUM) 500 MG chewable tablet Chew 1 tablet by mouth daily as needed for indigestion or heartburn.    [provider]  carvedilol (COREG) 25 MG tablet Take 1 tablet (25 mg total) by mouth 2 (two) times daily. 01/25/23 01/20/24  Alver Sorrow, NP  chlorhexidine (PERIDEX) 0.12 % solution Use as directed 15 mLs in the mouth or throat 2 (two) times daily. 12/21/22   [provider]  chlorthalidone (HYGROTON) 25 MG tablet TAKE 1 TABLET EVERY DAY 02/07/23   Chilton Si, MD  clopidogrel (PLAVIX) 75 MG tablet  TAKE 1 TABLET EVERY DAY. NEED APPOINTMENT FOR FURTHER REFILLS 02/16/23   Chilton Si, MD  Continuous Blood Gluc Sensor (FREESTYLE LIBRE 2 SENSOR) MISC 1 Device by Does not apply route every 14 (fourteen) days. 11/03/21   Romero Belling, MD  furosemide (LASIX) 40 MG tablet TAKE 1 TABLET (40 MG TOTAL) BY MOUTH DAILY. NEED APPT. 09/16/21   Chilton Si, MD  glucose blood (ACCU-CHEK AVIVA PLUS) test strip uad tid 12/14/21   Pincus Sanes, MD  insulin glargine (LANTUS SOLOSTAR) 100 UNIT/ML Solostar Pen Inject 14 Units into the skin daily. 04/05/23   Carlus Pavlov, MD  insulin lispro (HUMALOG KWIKPEN) 100 UNIT/ML KwikPen Inject 6-10 Units into the skin 2 (two) times daily before a meal. 04/05/23   Carlus Pavlov, MD  Insulin Pen Needle 32G X 4 MM MISC Use 3x a day 04/05/23   Carlus Pavlov, MD  losartan (COZAAR) 100 MG tablet Take 1 tablet (100 mg total) by mouth daily. 04/04/23   Alver Sorrow, NP  Microlet Lancets MISC UAD to check sugars TID.  E11.22 12/14/21   Pincus Sanes, MD  nitroGLYCERIN (NITROSTAT) 0.4 MG SL tablet Place 1 tablet (0.4 mg total) under the tongue every 5 (five) minutes as needed for chest pain (up to 3 doses). 12/23/22 12/23/23  Chilton Si, MD  senna-docusate (SENOKOT-S) 8.6-50 MG per tablet Take 2 tablets by mouth 2 (two)  times daily. For constipation. 09/07/12   Love, Evlyn Kanner, PA-C  tiZANidine (ZANAFLEX) 2 MG tablet Take 1 tablet (2 mg total) by mouth at bedtime as needed for muscle spasms. 03/04/23   Pincus Sanes, MD      Allergies    Penicillins, Pneumococcal vaccines, Shellfish allergy, Barbiturates, Gabapentin, Influenza vaccines, Lisinopril, Sildenafil, Topiramate, Zofran [ondansetron], Amlodipine, Levitra [vardenafil], Metformin, and Tadalafil    Review of Systems   Review of Systems  Constitutional:  Negative for fever.  Respiratory:  Positive for cough (x 2 days) and shortness of breath.   Cardiovascular:  Positive for chest pain.   Gastrointestinal:  Positive for abdominal pain, diarrhea, nausea and vomiting.  Musculoskeletal:  Positive for back pain.  Neurological:  Negative for weakness and numbness.    Physical Exam Updated Vital Signs BP (!) 167/74   Pulse 66   Temp 97.9 F (36.6 C) (Oral)   Resp 16   Ht 5\' 11"  (1.803 m)   Wt 95.3 kg   SpO2 100%   BMI 29.29 kg/m  Physical Exam Vitals and nursing note reviewed.  Constitutional:      General: He is not in acute distress.    Appearance: He is well-developed. He is not ill-appearing or diaphoretic.  HENT:     Head: Normocephalic and atraumatic.  Cardiovascular:     Rate and Rhythm: Normal rate and regular rhythm.     Heart sounds: Normal heart sounds.  Pulmonary:     Effort: Pulmonary effort is normal.     Breath sounds: Normal breath sounds.  Chest:     Chest wall: No tenderness.  Abdominal:     Palpations: Abdomen is soft.     Tenderness: There is generalized abdominal tenderness.  Musculoskeletal:     Right lower leg: No edema.     Left lower leg: No edema.  Skin:    General: Skin is warm and dry.  Neurological:     Mental Status: He is alert.     ED Results / Procedures / Treatments   Labs (all labs ordered are listed, but only abnormal results are displayed) Labs Reviewed  URINALYSIS, W/ REFLEX TO CULTURE (INFECTION SUSPECTED) - Abnormal; Notable for the following components:      Result Value   Glucose, UA 50 (*)    Protein, ur 30 (*)    Bacteria, UA RARE (*)    All other components within normal limits  COMPREHENSIVE METABOLIC PANEL - Abnormal; Notable for the following components:   Glucose, Bld 216 (*)    BUN 28 (*)    Creatinine, Ser 1.99 (*)    AST 134 (*)    ALT 89 (*)    Total Bilirubin 1.5 (*)    GFR, Estimated 35 (*)    All other components within normal limits  CBC WITH DIFFERENTIAL/PLATELET - Abnormal; Notable for the following components:   RBC 3.38 (*)    Hemoglobin 10.6 (*)    HCT 32.0 (*)    All other  components within normal limits  CBG MONITORING, ED - Abnormal; Notable for the following components:   Glucose-Capillary 201 (*)    All other components within normal limits  SARS CORONAVIRUS 2 BY RT PCR  LIPASE, BLOOD  TROPONIN I (HIGH SENSITIVITY)  TROPONIN I (HIGH SENSITIVITY)    EKG EKG Interpretation Date/Time:  Thursday July 14 2023 12:19:44 EDT Ventricular Rate:  70 PR Interval:  170 QRS Duration:  154 QT Interval:  440 QTC Calculation: 475 R  Axis:   11  Text Interpretation: Sinus rhythm Left bundle branch block similar to Jan 2023 Confirmed by Pricilla Loveless [phone]) on 07/14/2023 12:29:09 PM  Radiology DG Chest 2 View  Result Date: 07/14/2023 CLINICAL DATA:  Chest pain and vomiting EXAM: CHEST - 2 VIEW COMPARISON:  X-ray 03/29/2022 FINDINGS: Borderline cardiopericardial silhouette. No consolidation, pneumothorax or effusion. No edema. Degenerative changes of the spine. Overlapping cardiac leads. IMPRESSION: Borderline size heart.  No acute cardiopulmonary disease. Electronically Signed   By: Karen Kays M.D.   On: 07/14/2023 13:36    Procedures Procedures    Medications Ordered in ED Medications  sodium chloride 0.9 % bolus 500 mL (0 mLs Intravenous Stopped 07/14/23 1440)  metoCLOPramide (REGLAN) injection 5 mg (5 mg Intravenous Given 07/14/23 1352)  iohexol (OMNIPAQUE) 350 MG/ML injection 65 mL (65 mLs Intravenous Contrast Given 07/14/23 1542)    ED Course/ Medical Decision Making/ A&P                                 Medical Decision Making Amount and/or Complexity of Data Reviewed Labs: ordered. Radiology: ordered.  Risk Prescription drug management.   Patient was given a dose of Reglan.  He is feeling a little better.  He has negative troponins.  Chest x-ray is overall unremarkable.  Currently waiting on CT abdomen and reassessment.  Care transferred to Dr. Anitra Lauth.        Final Clinical Impression(s) / ED Diagnoses Final diagnoses:  None     Rx / DC Orders ED Discharge Orders     None         Pricilla Loveless, MD 07/14/23 1550

## 2023-07-14 NOTE — Plan of Care (Signed)

## 2023-07-14 NOTE — ED Notes (Signed)
ED TO INPATIENT HANDOFF REPORT  ED Nurse Name and Phone #: Alycia Rossetti [phone]  S Name/Age/Gender [name] 72 y.o. male Room/Bed: 026C/026C  Code Status   Code Status: Full Code  Home/SNF/Other Home Patient oriented to: self, place, time, and situation Is this baseline? Yes   Triage Complete: Triage complete  Chief Complaint Intractable nausea and vomiting [R11.2]  Triage Note Per EMS and pt report, pt started having chest pain last night but was able to fall asleep. This morning he was still having the chest pain but became nauseous and diaphoretic. Has vomited 3x this morning. Pt has a hx of heart stents. Was given 324mg  aspirin and 1 nitroglycerin. Describes the pain as burning and currently rates it 8/10. Pain is in midsternal chest, abd pain, and lower back. Pt also given 4mg  IV zofran.   Allergies Allergies  Allergen Reactions   Penicillins Anaphylaxis   Pneumococcal Vaccines Anaphylaxis   Shellfish Allergy Anaphylaxis   Barbiturates    Gabapentin     Urinary retention   Influenza Vaccines    Lisinopril Cough   Other Nausea And Vomiting    Pt wife states that dye that is used for eye exam in dr    Sildenafil Other (See Comments)    Other reaction(s): Headache, Dizziness   Topiramate Other (See Comments)    Chest spasms and numbness   Zofran [Ondansetron]    Amlodipine Other (See Comments)    LE edema   Levitra [Vardenafil] Other (See Comments)    headaches   Metformin Nausea And Vomiting   Tadalafil Other (See Comments)    dizziness    Level of Care/Admitting Diagnosis ED Disposition     ED Disposition  Admit   Condition  --   Comment  Hospital Area: [facility] [[id number]]  Level of Care: Telemetry Medical [104]  May place patient in observation at [facility] or [facility] if equivalent level of care is available:: No  Covid Evaluation: Asymptomatic - no recent exposure (last 10 days) testing not required  Diagnosis:  Intractable nausea and vomiting [720114]  Admitting Physician: Angie Fava [[id number]]  Attending Physician: Angie Fava [[id number]]          B Medical/Surgery History Past Medical History:  Diagnosis Date   ALLERGIC RHINITIS    CAD, NATIVE VESSEL    BMS to OM1 2001, DES to BMS 2005   Chronic back pain    herniated disc   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   Chronic kidney disease    Constipation    takes Carafate four times day   DIAB W/UNSPEC COMP TYPE II/UNSPEC TYPE UNCNTRL    ERECTILE DYSFUNCTION    GERD    HYPERLIPIDEMIA-MIXED    HYPERTENSION, BENIGN    LBBB (left bundle branch block) 02/02/2016   MORTON'S NEUROMA, RIGHT    OSA on CPAP 09/09/2020   Peripheral neuropathy    Seasonal allergies    takes Allegra and Benadryl daily prn;uses Flonase daily   SHOULDER PAIN, RIGHT    Snoring 09/09/2020   Stroke, 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, s/p CIR    TIA on medication 02/2012   Unstable angina (HCC) 07/09/2020   Past Surgical History:  Procedure Laterality Date   ANGIOPLASTY     COLONOSCOPY     CORONARY ANGIOPLASTY  2005   2 stents   CORONARY STENT INTERVENTION N/A 07/10/2020   Procedure: CORONARY STENT INTERVENTION;  Surgeon: [place]az[place],  Demetria Pore, MD;  Location: [facility] INVASIVE CV LAB;  Service: Cardiovascular;  Laterality: N/A;   DENTAL SURGERY     LARYNGOPLASTY  08/07/2012   Procedure: LARYNGOPLASTY;  Surgeon: Serena Colonel, MD;  Location: [facility] OR;  Service: ENT;  Laterality: Left;  Left Vocal Cord Medialyzation   LEFT HEART CATH AND CORONARY ANGIOGRAPHY N/A 07/10/2020   Procedure: LEFT HEART CATH AND CORONARY ANGIOGRAPHY;  Surgeon: Swazil[place], Peter M, MD;  Location: [facility] INVASIVE CV LAB;  Service: Cardiovascular;  Laterality: N/A;   left knee surgury     x 2    stent  2001, 2004   coronary stents     A IV Location/Drains/Wounds Patient Lines/Drains/Airways Status     Active Line/Drains/Airways     Name  Placement date Placement time Site Days   Peripheral IV 07/14/23 20 G Left Antecubital 07/14/23  1209  Antecubital  less than 1            Intake/Output Last 24 hours  Intake/Output Summary (Last 24 hours) at 07/14/2023 2012 Last data filed at 07/14/2023 1456 Gross per 24 hour  Intake 500 ml  Output 300 ml  Net 200 ml    Labs/Imaging Results for orders placed or performed during the hospital encounter of 07/14/23 (from the past 48 hour(s))  POC CBG, ED     Status: Abnormal   Collection Time: 07/14/23 12:33 PM  Result Value Ref Range   Glucose-Capillary 201 (H) 70 - 99 mg/dL    Comment: Glucose reference range applies only to samples taken after fasting for at least 8 hours.  Comprehensive metabolic panel     Status: Abnormal   Collection Time: 07/14/23 12:51 PM  Result Value Ref Range   Sodium 138 135 - 145 mmol/L   Potassium 4.1 3.5 - 5.1 mmol/L   Chloride 102 98 - 111 mmol/L   CO2 24 22 - 32 mmol/L   Glucose, Bld 216 (H) 70 - 99 mg/dL    Comment: Glucose reference range applies only to samples taken after fasting for at least 8 hours.   BUN 28 (H) 8 - 23 mg/dL   Creatinine, Ser 0.98 (H) 0.61 - 1.24 mg/dL   Calcium 9.4 8.9 - 11.9 mg/dL   Total Protein 7.4 6.5 - 8.1 g/dL   Albumin 3.6 3.5 - 5.0 g/dL   AST 147 (H) 15 - 41 U/L   ALT 89 (H) 0 - 44 U/L   Alkaline Phosphatase 78 38 - 126 U/L   Total Bilirubin 1.5 (H) 0.3 - 1.2 mg/dL   GFR, Estimated 35 (L) >60 mL/min    Comment: (NOTE) Calculated using the CKD-EPI Creatinine Equation (2021)    Anion gap 12 5 - 15    Comment: Performed at [facility] Lab, [address] [place], [place] [address]  Lipase, blood     Status: None   Collection Time: 07/14/23 12:51 PM  Result Value Ref Range   Lipase 34 11 - 51 U/L    Comment: Performed at [facility] Lab, [address], [place], [place] [address]  Troponin I (High Sensitivity)     Status: None   Collection Time: 07/14/23 12:51 PM  Result Value Ref Range    Troponin I (High Sensitivity) 9 <18 ng/L    Comment: (NOTE) Elevated high sensitivity troponin I (hsTnI) values and significant  changes across serial measurements may suggest ACS but many other  chronic and acute conditions are known to elevate hsTnI results.  Refer to  the "Links" section for chest pain algorithms and additional  guidance. Performed at [facility], [address] [place], [place] [address]   CBC with Differential     Status: Abnormal   Collection Time: 07/14/23 12:51 PM  Result Value Ref Range   WBC 8.4 4.0 - 10.5 K/uL   RBC 3.38 (L) 4.22 - 5.81 MIL/uL   Hemoglobin 10.6 (L) 13.0 - 17.0 g/dL   HCT 60.4 (L) 54.0 - 98.1 %   MCV 94.7 80.0 - 100.0 fL   MCH 31.4 26.0 - 34.0 pg   MCHC 33.1 30.0 - 36.0 g/dL   RDW 19.1 47.8 - 29.5 %   Platelets 151 150 - 400 K/uL   nRBC 0.0 0.0 - 0.2 %   Neutrophils Relative % 75 %   Neutro Abs 6.3 1.7 - 7.7 K/uL   Lymphocytes Relative 17 %   Lymphs Abs 1.4 0.7 - 4.0 K/uL   Monocytes Relative 7 %   Monocytes Absolute 0.6 0.1 - 1.0 K/uL   Eosinophils Relative 1 %   Eosinophils Absolute 0.1 0.0 - 0.5 K/uL   Basophils Relative 0 %   Basophils Absolute 0.0 0.0 - 0.1 K/uL   Immature Granulocytes 0 %   Abs Immature Granulocytes 0.02 0.00 - 0.07 K/uL    Comment: Performed at [facility] Lab, [address], [place], [place] [address]  SARS Coronavirus 2 by RT PCR (hospital order, performed in [facility] hospital lab) *cepheid single result test* Anterior Nasal Swab     Status: None   Collection Time: 07/14/23 12:55 PM   Specimen: Anterior Nasal Swab  Result Value Ref Range   SARS Coronavirus 2 by RT PCR NEGATIVE NEGATIVE    Comment: Performed at [facility], [address], [place], [place] [address]  Urinalysis, w/ Reflex to Culture (Infection Suspected) -Urine, Clean Catch     Status: Abnormal   Collection Time: 07/14/23  2:40 PM  Result Value Ref Range   Specimen Source URINE, CLEAN CATCH    Color, Urine YELLOW  YELLOW   APPearance CLEAR CLEAR   Specific Gravity, Urine 1.017 1.005 - 1.030   pH 5.0 5.0 - 8.0   Glucose, UA 50 (A) NEGATIVE mg/dL   Hgb urine dipstick NEGATIVE NEGATIVE   Bilirubin Urine NEGATIVE NEGATIVE   Ketones, ur NEGATIVE NEGATIVE mg/dL   Protein, ur 30 (A) NEGATIVE mg/dL   Nitrite NEGATIVE NEGATIVE   Leukocytes,Ua NEGATIVE NEGATIVE   RBC / HPF 0-5 0 - 5 RBC/hpf   WBC, UA 0-5 0 - 5 WBC/hpf    Comment:        Reflex urine culture not performed if WBC <=10, OR if Squamous epithelial cells >5. If Squamous epithelial cells >5 suggest recollection.    Bacteria, UA RARE (A) NONE SEEN   Squamous Epithelial / HPF 0-5 0 - 5 /HPF   Mucus PRESENT     Comment: Performed at [facility] Lab, [address] [place], [place] [address]  Troponin I (High Sensitivity)     Status: None   Collection Time: 07/14/23  2:40 PM  Result Value Ref Range   Troponin I (High Sensitivity) 8 <18 ng/L    Comment: (NOTE) Elevated high sensitivity troponin I (hsTnI) values and significant  changes across serial measurements may suggest ACS but many other  chronic and acute conditions are known to elevate hsTnI results.  Refer to the "Links" section for chest pain algorithms and additional  guidance.  Performed at [facility], [address], [place], [place] [address]    *Note: Due to a large number of results and/or encounters for the requested time period, some results have not been displayed. A complete set of results can be found in Results Review.   US Abdomen Limited RUQ (LIVER/GB)  Result Date: 07/14/2023 CLINICAL DATA:  Abdominal pain. EXAM: ULTRASOUND ABDOMEN LIMITED RIGHT UPPER QUADRANT COMPARISON:  None Available. FINDINGS: Gallbladder: Lobulated, nonshadowing echogenic material is seen within the dependent portion of a moderately distended gallbladder lumen. The largest measures approximately 1.6 cm. This area demonstrates no flow on color Doppler evaluation. There is no evidence  of gallbladder wall thickening (2.4 mm). No sonographic Murphy sign noted by sonographer. Common bile duct: Diameter: 4.8 mm Liver: No focal lesion identified. Within normal limits in parenchymal echogenicity. Portal vein is patent on color Doppler imaging with normal direction of blood flow towards the liver. Other: None. IMPRESSION: Findings likely consistent with cholelithiasis and/or tumefactive sludge within the gallbladder lumen, without evidence of acute cholecystitis. Correlation with 6-12 week follow-up right upper quadrant ultrasound is recommended to determine stability. Electronically Signed   By: Aram Candela M.D.   On: 07/14/2023 18:56   CT ABDOMEN PELVIS W CONTRAST  Result Date: 07/14/2023 CLINICAL DATA:  Acute generalized abdominal pain. EXAM: CT ABDOMEN AND PELVIS WITH CONTRAST TECHNIQUE: Multidetector CT imaging of the abdomen and pelvis was performed using the standard protocol following bolus administration of intravenous contrast. RADIATION DOSE REDUCTION: This exam was performed according to the departmental dose-optimization program which includes automated exposure control, adjustment of the mA and/or kV according to patient size and/or use of iterative reconstruction technique. CONTRAST:  65mL OMNIPAQUE IOHEXOL 350 MG/ML SOLN COMPARISON:  Apr 20, 2023. FINDINGS: Lower chest: No acute abnormality. Hepatobiliary: Minimal cholelithiasis. No biliary dilatation. Liver is unremarkable. Pancreas: Stable 20 x 11 mm low density seen in uncinate process of pancreatic head. No acute abnormality is noted. Spleen: Normal in size without focal abnormality. Adrenals/Urinary Tract: Adrenal glands are unremarkable. Kidneys are normal, without renal calculi, focal lesion, or hydronephrosis. Bladder is unremarkable. Stomach/Bowel: Stomach is within normal limits. Appendix appears normal. No evidence of bowel wall thickening, distention, or inflammatory changes. Vascular/Lymphatic: No significant  vascular findings are present. No enlarged abdominal or pelvic lymph nodes. Reproductive: Prostate is unremarkable. Other: Small fat containing left inguinal hernia. No ascites is noted. Musculoskeletal: No acute or significant osseous findings. IMPRESSION: Grossly stable 20 x 11 mm low density seen in uncinate process of pancreatic head. 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 Per consensus criteria, follow-up with preferably pre and post contrast abdominal MRI at 1 year is recommended. This recommendation follows ACR consensus guidelines: Managing Incidental Findings on Abdominal CT: White Paper of the ACR Incidental Findings Committee. J Am Coll Radiol 2010;7:754-773. Minimal cholelithiasis. Small fat containing left inguinal hernia. Electronically Signed   By: Lupita Raider M.D.   On: 07/14/2023 16:27   DG Chest 2 View  Result Date: 07/14/2023 CLINICAL DATA:  Chest pain and vomiting EXAM: CHEST - 2 VIEW COMPARISON:  X-ray 03/29/2022 FINDINGS: Borderline cardiopericardial silhouette. No consolidation, pneumothorax or effusion. No edema. Degenerative changes of the spine. Overlapping cardiac leads. IMPRESSION: Borderline size heart.  No acute cardiopulmonary disease. Electronically Signed   By: Karen Kays M.D.   On: 07/14/2023 13:36    Pending Labs Unresulted Labs (From admission, onward)     Start  Ordered   07/15/23 0500  CBC with Differential/Platelet  Tomorrow morning,   R        07/14/23 1944   07/15/23 0500  Comprehensive metabolic panel  Tomorrow morning,   R        07/14/23 1944   07/15/23 0500  Magnesium  Tomorrow morning,   R        07/14/23 1944   07/14/23 1945  Magnesium  Add-on,   AD        07/14/23 1944            Vitals/Pain Today's Vitals   07/14/23 1545 07/14/23 1800 07/14/23 1815 07/14/23 1935  BP: 138/75 (!) 154/95  (!) 161/88  Pulse: 87 91  85  Resp: 17 (!) 174  16  Temp:   99.6 F (37.6  C)   TempSrc:   Axillary   SpO2: 100% 95%  100%  Weight:      Height:      PainSc:        Isolation Precautions No active isolations  Medications Medications  lactated ringers infusion ( Intravenous New Bag/Given 07/14/23 1815)  acetaminophen (TYLENOL) tablet 650 mg (has no administration in time range)    Or  acetaminophen (TYLENOL) suppository 650 mg (has no administration in time range)  melatonin tablet 3 mg (has no administration in time range)  promethazine (PHENERGAN) 12.5 mg in sodium chloride 0.9 % 50 mL IVPB (has no administration in time range)  LORazepam (ATIVAN) injection 1 mg (has no administration in time range)  naloxone (NARCAN) injection 0.4 mg (has no administration in time range)  fentaNYL (SUBLIMAZE) injection 25 mcg (has no administration in time range)  sodium chloride 0.9 % bolus 500 mL (0 mLs Intravenous Stopped 07/14/23 1440)  metoCLOPramide (REGLAN) injection 5 mg (5 mg Intravenous Given 07/14/23 1352)  iohexol (OMNIPAQUE) 350 MG/ML injection 65 mL (65 mLs Intravenous Contrast Given 07/14/23 1542)  LORazepam (ATIVAN) injection 1 mg (1 mg Intravenous Given 07/14/23 1558)    Mobility walks     Focused Assessments    R Recommendations: See Admitting Provider Note  Report given to:   Additional Notes: Patient comes in with vomiting and abdominal pain. Vomiting is currently under control. Patient is resting and is in no distress at this time. Family is at bedside.

## 2023-07-14 NOTE — ED Notes (Signed)
Pt currently sleeping. Pt's family state about 10 minutes ago he had an episode of dry heaves and then fell back asleep.

## 2023-07-14 NOTE — ED Provider Notes (Signed)
Patient is a 72 year old male with multiple medical problems presenting today with vomiting and chest/abdominal pain.  Mild elevated LFTs today AST of 134 ALT of 89 and total bilirubin of 1.5 but unchanged creatinine of 1.99, no evidence of UTI and lipase within normal limits.  CT is pending.  CT scan did show a lesion in the pancreas that they felt most likely was a pseudocyst but recommended that he need surveillance in 1 year.  On repeat evaluation patient is still having vomiting and abdominal discomfort.  Ultrasound was ordered which does show cholelithiasis but no evidence of cholecystitis or obstruction at this time.  Patient has now received Zofran, Reglan and Ativan and is still having intermittent vomiting.  Speaking with his wife and daughter they are concerned that he may have food poisoning from some chicken he ate at [employer] which also made his daughter not feel well.  At this time given his persistent symptoms we will admit for intractable nausea vomiting.   Gwyneth Sprout, MD 07/14/23 [phone]

## 2023-07-14 NOTE — H&P (Signed)
History and Physical      Basile Cdebaca ZOX:[id number] DOB: 03/08/1951 DOA: 07/14/2023; DOS: 07/14/2023  PCP: Pincus Sanes, MD  Patient coming from: home   I have personally briefly reviewed patient's old medical records in [facility]  Chief Complaint: Nausea vomiting  HPI: [name] is a 72 y.o. 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   The patient reports 1 day of persistent nausea resulting in at least 6-7 episodes of nonbloody, nonbilious emesis over that timeframe, with most recent such episodes of such occurring in the emergency department this evening.  This has been associated with mild sharp epigastric discomfort, worse with palpation, with some radiation up into the chest.  Denies any associated diarrhea, melena, or hematochezia.  Denies any associated subjective fever, chills, rigors, or generalized myalgias.  Denies any associated dysuria or gross hematuria.  He notes that the above symptoms started a few hours after eating chicken from [employer] for dinner on the evening of 07/13/2023.  He notes that his daughter had eaten the chicken from [employer] 1-2 nights prior to this, and experienced a very similar constellation of symptoms over the ensuing 48 hours, before improving over the last day.  His medical history is notable for coronary artery disease status post PCI with stent placement in August 2021.  Per today's formal medication reconciliation, he remains on dual antiplatelet therapy with daily baby aspirin as well as Plavix.  Denies any routine alcohol consumption and confirms a history of GERD.  His medical history is also notable for type 2 diabetes  mellitus, with most recent hemoglobin A1c noted to be 8.3% when checked in May 2024.  Denies any history of recreational drug abuse.  No recent shortness of breath, orthopnea, PND, or worsening of peripheral edema.  The nausea/vomiting and epigastric discomfort over the course the last day has not been associated with any palpitations, diaphoresis, dizziness Presyncope, or syncope.  Per chart review, most recent set of liver enzymes were checked on 12/24/2022, and were notable for the following at that time: Total bilirubin 0.8, alkaline phosphatase 73, AST 17, and ALT 15.    ED Course:  Vital signs in the ED were notable for the following: Temperature max 99.6; heart rate in the 60s to 90s; systolic blood pressures in the 120s to 160s; respiratory rate 16-22, oxygen saturation 95 to 100% on room air.  Labs were notable for the following: CMP was notable for the following: Bicarbonate 24, anion gap 12, creatinine 1.99 compared to most recent prior serum creatinine data point of 1.85 on 01/25/2023, glucose 216, alkaline phosphatase 78, total bilirubin 1.5, AST 134, ALT 89.  Lipase 34.  High sensitive troponin I initially noted to be 9, with repeat value trending down to 8.  CBC notable for white cell count 8400, hemoglobin 10.6 associated normocytic/recurrent properties and nonelevated RDW and relative dimensions and prior hemoglobin data point of 11.2 on 03/30/2023.  Urinalysis showed no white blood cells and was leukocyte esterase/nitrate negative.  COVID-19 PCR was negative.  Per my interpretation, EKG in ED demonstrated the following: Compared to most recent prior EKG from 12/23/2022, today's EKG shows sinus rhythm with left bundle branch block, unchanged from prior, with heart rate 70, T wave inversions in leads I and  aVL, unchanged from most recent prior EKG, as well as less than 1 mm ST depression in leads II, aVF, and V6, all which appear unchanged from most recent prior EKG, will demonstrated no  evidence of ST elevation.  Imaging in the ED, per corresponding formal radiology read, was notable for the following: Chest x-ray showed no evidence of acute cardiopulmonary process, including no evidence of infiltrate, edema, effusion, or pneumothorax.  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  Today CT abdomen/pelvis also showed minimal cholelithiasis without any evidence of gallbladder dilation, gallbladder wall thickening, pericholecystic fluid, no evidence of biliary dilation, no evidence of choledocholithiasis, will also demonstrating an unremarkable appearing liver.  Right upper quadrant ultrasound showed cholelithiasis with moderate Lee distended gallbladder, but no evidence of gallbladder wall thickening, no evidence of pericholecystic fluid, will demonstrating a negative sonographic Murphy sign and no evidence of common bile duct dilation or any evidence of choledocholithiasis.  While in the ED, the following were administered: Reglan 5 mg IV x 1 dose, Ativan 1 mg IV x 1 dose, normal saline x 500 cc bolus and initiation continuous LR running at 125 cc/h.  In spite of the above antiemetics, the patient has continued to experience persistent nausea/vomiting.  Subsequently, the patient was admitted for further evaluation management of presenting intractable nausea/vomiting complicated by epigastric discomfort, with presenting labs notable for acute transaminitis.     Review of Systems: As per HPI otherwise 10 point review of systems negative.   Past Medical History:  Diagnosis Date   ALLERGIC RHINITIS    CAD, NATIVE VESSEL    BMS to OM1 2001, DES to BMS 2005   Chronic back pain    herniated disc   Chronic combined systolic and diastolic heart failure (HCC)  13/24/4010   Chronic kidney disease    Constipation    takes Carafate four times day   DIAB W/UNSPEC COMP TYPE II/UNSPEC TYPE UNCNTRL    ERECTILE DYSFUNCTION    GERD    HYPERLIPIDEMIA-MIXED    HYPERTENSION, BENIGN    LBBB (left bundle branch block) 02/02/2016   MORTON'S NEUROMA, RIGHT    OSA on CPAP 09/09/2020   Peripheral neuropathy    Seasonal allergies    takes Allegra and Benadryl daily prn;uses Flonase daily   SHOULDER PAIN, RIGHT    Snoring 09/09/2020   Stroke, 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, s/p CIR    TIA on medication 02/2012   Unstable angina (HCC) 07/09/2020    Past Surgical History:  Procedure Laterality Date   ANGIOPLASTY     COLONOSCOPY     CORONARY ANGIOPLASTY  2005   2 stents   CORONARY STENT INTERVENTION N/A 07/10/2020   Procedure: CORONARY STENT INTERVENTION;  Surgeon: Swaziland, Peter M, MD;  Location: [facility] INVASIVE CV LAB;  Service: Cardiovascular;  Laterality: N/A;   DENTAL SURGERY     LARYNGOPLASTY  08/07/2012   Procedure: LARYNGOPLASTY;  Surgeon: Serena Colonel, MD;  Location: [facility] OR;  Service: ENT;  Laterality: Left;  Left Vocal Cord Medialyzation   LEFT HEART CATH AND CORONARY ANGIOGRAPHY N/A 07/10/2020   Procedure: LEFT HEART CATH AND CORONARY ANGIOGRAPHY;  Surgeon: Swaziland, Peter M, MD;  Location: [facility] INVASIVE CV LAB;  Service: Cardiovascular;  Laterality: N/A;   left knee surgury  x 2    stent  2001, 2004   coronary stents    Social History:  reports that he quit smoking about 40 years ago. His smoking use included cigarettes. He started smoking about 49 years ago. He has a 22.5 pack-year smoking history. He has never used smokeless tobacco. He reports that he does not drink alcohol and does not use drugs.   Allergies  Allergen Reactions   Penicillins Anaphylaxis   Pneumococcal Vaccines Anaphylaxis   Shellfish Allergy Anaphylaxis   Barbiturates    Gabapentin     Urinary retention   Influenza Vaccines    Lisinopril Cough   Other  Nausea And Vomiting    Pt wife states that dye that is used for eye exam in dr    Sildenafil Other (See Comments)    Other reaction(s): Headache, Dizziness   Topiramate Other (See Comments)    Chest spasms and numbness   Zofran [Ondansetron]    Amlodipine Other (See Comments)    LE edema   Levitra [Vardenafil] Other (See Comments)    headaches   Metformin Nausea And Vomiting   Tadalafil Other (See Comments)    dizziness    Family History  Problem Relation Age of Onset   Cancer Mother    Liver cancer Brother    Colon cancer Neg Hx    Esophageal cancer Neg Hx     Family history reviewed and not pertinent    Prior to Admission medications   Medication Sig Start Date End Date Taking? Authorizing Provider  aspirin 325 MG tablet Take 325 mg by mouth daily as needed for moderate pain.   Yes [provider]  aspirin EC 81 MG tablet Take 1 tablet (81 mg total) by mouth daily. Swallow whole. 07/10/20  Yes Dunn, Dayna N, PA-C  atorvastatin (LIPITOR) 40 MG tablet Take 1 tablet (40 mg total) by mouth daily. 05/03/23  Yes Alver Sorrow, NP  calcium carbonate (TUMS - DOSED IN MG ELEMENTAL CALCIUM) 500 MG chewable tablet Chew 1 tablet by mouth daily as needed for indigestion or heartburn.   Yes [provider]  carvedilol (COREG) 25 MG tablet Take 1 tablet (25 mg total) by mouth 2 (two) times daily. 01/25/23 01/20/24 Yes Alver Sorrow, NP  chlorthalidone (HYGROTON) 25 MG tablet TAKE 1 TABLET EVERY DAY 02/07/23  Yes Chilton Si, MD  clopidogrel (PLAVIX) 75 MG tablet TAKE 1 TABLET EVERY DAY. NEED APPOINTMENT FOR FURTHER REFILLS 02/16/23  Yes Chilton Si, MD  furosemide (LASIX) 40 MG tablet TAKE 1 TABLET (40 MG TOTAL) BY MOUTH DAILY. NEED APPT. 09/16/21  Yes Chilton Si, MD  insulin glargine (LANTUS SOLOSTAR) 100 UNIT/ML Solostar Pen Inject 14 Units into the skin daily. 04/05/23  Yes Carlus Pavlov, MD  insulin lispro (HUMALOG KWIKPEN) 100 UNIT/ML KwikPen  Inject 6-10 Units into the skin 2 (two) times daily before a meal. 04/05/23  Yes Carlus Pavlov, MD  losartan (COZAAR) 100 MG tablet Take 1 tablet (100 mg total) by mouth daily. 04/04/23  Yes Alver Sorrow, NP  nitroGLYCERIN (NITROSTAT) 0.4 MG SL tablet Place 1 tablet (0.4 mg total) under the tongue every 5 (five) minutes as needed for chest pain (up to 3 doses). 12/23/22 12/23/23 Yes Chilton Si, MD  senna-docusate (SENOKOT-S) 8.6-50 MG per tablet Take 2 tablets by mouth 2 (two) times daily. For constipation. 09/07/12  Yes Love, Evlyn Kanner, PA-C  tiZANidine (ZANAFLEX) 2 MG tablet Take 1 tablet (2 mg total) by mouth at bedtime as  needed for muscle spasms. 03/04/23  Yes Burns, Bobette Mo, MD  Blood Pressure Monitoring (BLOOD PRESSURE CUFF) MISC 1 Units by Does not apply route daily. 02/03/21   Chilton Si, MD  chlorhexidine (PERIDEX) 0.12 % solution Use as directed 15 mLs in the mouth or throat 2 (two) times daily. Patient not taking: Reported on 07/14/2023 12/21/22   [provider]  Continuous Blood Gluc Sensor (FREESTYLE LIBRE 2 SENSOR) MISC 1 Device by Does not apply route every 14 (fourteen) days. 11/03/21   Romero Belling, MD  glucose blood (ACCU-CHEK AVIVA PLUS) test strip uad tid 12/14/21   Pincus Sanes, MD  Insulin Pen Needle 32G X 4 MM MISC Use 3x a day 04/05/23   Carlus Pavlov, MD  Microlet Lancets MISC UAD to check sugars TID.  E11.22 12/14/21   Pincus Sanes, MD     Objective    Physical Exam: Vitals:   07/14/23 1800 07/14/23 1815 07/14/23 1935 07/14/23 2030  BP: (!) 154/95  (!) 161/88 (!) 155/85  Pulse: 91  85 89  Resp: (!) 174  16 17  Temp:  99.6 F (37.6 C)    TempSrc:  Axillary    SpO2: 95%  100% 100%  Weight:      Height:        General: appears to be stated age; alert, oriented Skin: warm, dry, no rash Head:  AT/[place] Mouth:  Oral mucosa membranes appear dry, normal dentition Neck: supple; trachea midline Heart:  RRR; did not appreciate any  M/R/G Lungs: CTAB, did not appreciate any wheezes, rales, or rhonchi Abdomen: + BS; soft, ND, mild tenderness over the epigastrium, in the absence of any associated guarding, rigidity, or rebound tenderness. Vascular: 2+ pedal pulses b/l; 2+ radial pulses b/l Extremities: no peripheral edema, no muscle wasting Neuro: strength and sensation intact in upper and lower extremities b/l    Labs on Admission: I have personally reviewed following labs and imaging studies  CBC: Recent Labs  Lab 07/14/23 1251  WBC 8.4  NEUTROABS 6.3  HGB 10.6*  HCT 32.0*  MCV 94.7  PLT 151   Basic Metabolic Panel: Recent Labs  Lab 07/14/23 1251  NA 138  K 4.1  CL 102  CO2 24  GLUCOSE 216*  BUN 28*  CREATININE 1.99*  CALCIUM 9.4   GFR: Estimated Creatinine Clearance: 39 mL/min (A) (by C-G formula based on SCr of 1.99 mg/dL (H)). Liver Function Tests: Recent Labs  Lab 07/14/23 1251  AST 134*  ALT 89*  ALKPHOS 78  BILITOT 1.5*  PROT 7.4  ALBUMIN 3.6   Recent Labs  Lab 07/14/23 1251  LIPASE 34   No results for input(s): "AMMONIA" in the last 168 hours. Coagulation Profile: No results for input(s): "INR", "PROTIME" in the last 168 hours. Cardiac Enzymes: No results for input(s): "CKTOTAL", "CKMB", "CKMBINDEX", "TROPONINI" in the last 168 hours. BNP (last 3 results) No results for input(s): "PROBNP" in the last 8760 hours. HbA1C: No results for input(s): "HGBA1C" in the last 72 hours. CBG: Recent Labs  Lab 07/14/23 1233  GLUCAP 201*   Lipid Profile: No results for input(s): "CHOL", "HDL", "LDLCALC", "TRIG", "CHOLHDL", "LDLDIRECT" in the last 72 hours. Thyroid Function Tests: No results for input(s): "TSH", "T4TOTAL", "FREET4", "T3FREE", "THYROIDAB" in the last 72 hours. Anemia Panel: No results for input(s): "VITAMINB12", "FOLATE", "FERRITIN", "TIBC", "IRON", "RETICCTPCT" in the last 72 hours. Urine analysis:    Component Value Date/Time   COLORURINE YELLOW 07/14/2023 1440    APPEARANCEUR  CLEAR 07/14/2023 1440   LABSPEC 1.017 07/14/2023 1440   PHURINE 5.0 07/14/2023 1440   GLUCOSEU 50 (A) 07/14/2023 1440   HGBUR NEGATIVE 07/14/2023 1440   BILIRUBINUR NEGATIVE 07/14/2023 1440   BILIRUBINUR negative 11/06/2021 0918   KETONESUR NEGATIVE 07/14/2023 1440   PROTEINUR 30 (A) 07/14/2023 1440   UROBILINOGEN 0.2 11/06/2021 0918   UROBILINOGEN 0.2 04/07/2012 1648   NITRITE NEGATIVE 07/14/2023 1440   LEUKOCYTESUR NEGATIVE 07/14/2023 1440    Radiological Exams on Admission: US Abdomen Limited RUQ (LIVER/GB)  Result Date: 07/14/2023 CLINICAL DATA:  Abdominal pain. EXAM: ULTRASOUND ABDOMEN LIMITED RIGHT UPPER QUADRANT COMPARISON:  None Available. FINDINGS: Gallbladder: Lobulated, nonshadowing echogenic material is seen within the dependent portion of a moderately distended gallbladder lumen. The largest measures approximately 1.6 cm. This area demonstrates no flow on color Doppler evaluation. There is no evidence of gallbladder wall thickening (2.4 mm). No sonographic Murphy sign noted by sonographer. Common bile duct: Diameter: 4.8 mm Liver: No focal lesion identified. Within normal limits in parenchymal echogenicity. Portal vein is patent on color Doppler imaging with normal direction of blood flow towards the liver. Other: None. IMPRESSION: Findings likely consistent with cholelithiasis and/or tumefactive sludge within the gallbladder lumen, without evidence of acute cholecystitis. Correlation with 6-12 week follow-up right upper quadrant ultrasound is recommended to determine stability. Electronically Signed   By: Aram Candela M.D.   On: 07/14/2023 18:56   CT ABDOMEN PELVIS W CONTRAST  Result Date: 07/14/2023 CLINICAL DATA:  Acute generalized abdominal pain. EXAM: CT ABDOMEN AND PELVIS WITH CONTRAST TECHNIQUE: Multidetector CT imaging of the abdomen and pelvis was performed using the standard protocol following bolus administration of intravenous contrast. RADIATION  DOSE REDUCTION: This exam was performed according to the departmental dose-optimization program which includes automated exposure control, adjustment of the mA and/or kV according to patient size and/or use of iterative reconstruction technique. CONTRAST:  65mL OMNIPAQUE IOHEXOL 350 MG/ML SOLN COMPARISON:  Apr 20, 2023. FINDINGS: Lower chest: No acute abnormality. Hepatobiliary: Minimal cholelithiasis. No biliary dilatation. Liver is unremarkable. Pancreas: Stable 20 x 11 mm low density seen in uncinate process of pancreatic head. No acute abnormality is noted. Spleen: Normal in size without focal abnormality. Adrenals/Urinary Tract: Adrenal glands are unremarkable. Kidneys are normal, without renal calculi, focal lesion, or hydronephrosis. Bladder is unremarkable. Stomach/Bowel: Stomach is within normal limits. Appendix appears normal. No evidence of bowel wall thickening, distention, or inflammatory changes. Vascular/Lymphatic: No significant vascular findings are present. No enlarged abdominal or pelvic lymph nodes. Reproductive: Prostate is unremarkable. Other: Small fat containing left inguinal hernia. No ascites is noted. Musculoskeletal: No acute or significant osseous findings. IMPRESSION: Grossly stable 20 x 11 mm low density seen in uncinate process of pancreatic head. 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 Per consensus criteria, follow-up with preferably pre and post contrast abdominal MRI at 1 year is recommended. This recommendation follows ACR consensus guidelines: Managing Incidental Findings on Abdominal CT: White Paper of the ACR Incidental Findings Committee. J Am Coll Radiol 2010;7:754-773. Minimal cholelithiasis. Small fat containing left inguinal hernia. Electronically Signed   By: Lupita Raider M.D.   On: 07/14/2023 16:27   DG Chest 2 View  Result Date: 07/14/2023 CLINICAL DATA:  Chest pain and vomiting EXAM: CHEST -  2 VIEW COMPARISON:  X-ray 03/29/2022 FINDINGS: Borderline cardiopericardial silhouette. No consolidation, pneumothorax or effusion. No edema. Degenerative changes of the spine. Overlapping cardiac leads. IMPRESSION: Borderline size heart.  No acute  cardiopulmonary disease. Electronically Signed   By: Karen Kays M.D.   On: 07/14/2023 13:36      Assessment/Plan   Principal Problem:   Intractable nausea and vomiting Active Problems:   DM2 (diabetes mellitus, type 2) (HCC)   Hyperlipidemia   Essential hypertension   GERD (gastroesophageal reflux disease)   Chronic diastolic CHF (congestive heart failure) (HCC)   Anemia of chronic disease   CKD stage 3b, GFR 30-44 ml/min (HCC)   Epigastric pain   Transaminitis     #) Intractable nausea and vomiting with epigastric pain: At least 6-7 episodes of nausea associated with nonbloody, nonbilious emesis over the course the last 1 day.  As this started within a few hours of eating restaurant chicken, that also produce similar symptoms here with patient's daughter, there is the possibility for food poisoning on the basis of staff aureus versus viral etiology.  Patient's nausea/vomiting has been refractory to multiple antiemetics administered in the ED this evening, and he is currently unable to tolerate p.o.  He also has some mild epigastric discomfort, which may be musculoskeletal in nature, as the patient's abdominal discomfort started after the first few episodes of vomiting.   Right upper quadrant ultrasound and CT abdomen/pelvis show evidence of cholelithiasis, without evidence of any corresponding acute process, including no radiographic evidence of acute cholecystitis nor any evidence of choledocholithiasis.  Laboratory findings include very mild elevation in transaminases, but is not consistent with a cholestatic pattern.  CT abdomen/pelvis shows evidence of an unchanged pseudocyst, but without any inflammatory changes or elevation in lipase to  suggest acute pancreatitis versus acute exacerbation of chronic pancreatitis.  Differential could also include biliary colic given the presence of cholelithiasis, although the above history appears less suggestive of this.  If the patient's symptoms remain persistent outside of a 24-hour window of symptomatology, could consider pursuit of HIDA scan to further evaluate.  He is afebrile, and without leukocytosis.  Appears hemodynamically stable, and without criteria for sepsis.  Of note, per chart review, there is a documented history of allergic reaction to Zofran.   Plan: Prn IV Phenergan, with prn IV Ativan for nausea/vomiting refractory to Phenergan.  Lactated Ringer's at 100 cc/h.  Add on serum magnesium level.  Check TSH.  Repeat lipase level in the morning.  Check urinary drug screen, serum ethanol level.  Check acute viral hepatitis panel.  Check direct bilirubin.  Protonix 40 mg IV daily, first dose now.  Prn IV fentanyl.  Repeat CBC, CMP in the morning.  Hold outpatient atorvastatin for now.  Check INR.  Check acetaminophen level.                #) Lesion in the pancreatic head: Today CT abdomen/pelvis, comparison to CT from 04/20/2023 shows an unchanged 20 x 11 mm low density in the end connate process of the pancreatic head, which radiology feels is most likely representative of a pseudocyst, recommending an abdominal MRI to occur in 1 year for further monitoring thereof.  Plan: Per radiology recommendation, will convey recommendation for abdominal MRI to occur in 1 year.                   #) CKD Stage 3B: Documented history of such, with baseline creatinine 1.8-2.2, with presenting creatinine consistent with this baseline.    Plan: Monitor strict I's and O's and daily weights.  Attempt to avoid nephrotoxic agents.  CMP/magnesium level in the AM.                     #)  Chronic diastolic heart failure: documented history of such, with most  recent echocardiogram performed on 02/15/2023, which showed LVEF 55%, no focal motion abnormalities, grade 1 diastolic dysfunction and normal right ventricular systolic function, and trivial mitral regurgitation. No clinical or radiographic evidence to suggest acutely decompensated heart failure at this time. home diuretic regimen reportedly consists of the following: Chlorthalidone, Lasix.  Rather, the patient appears clinically mildly dehydrated in the setting of intractable nausea/vomiting and limited oral intake over the course of the last day.  Consequently, we will hold home diuretic medications for now, provide gentle IV fluids as outlined above.  Plan: monitor strict I's & O's and daily weights. Repeat CMP in AM. Check serum mag level.  Hold home diuretic regimen for now, as further outlined above.                  #) Type 2 Diabetes Mellitus: documented history of such. Home insulin regimen: Lantus 14 units subcu daily, Humalog sliding scale, twice daily.. Home oral hypoglycemic agents: None. presenting blood sugar: 216. Most recent A1c noted to be 8.3% when checked on 04/05/2023.  in terms of initial dose of basal insulin to be started during this hospitalization, will resume approximately half of outpatient dose in order to reduce risk for ensuing hypoglycemia.    Plan: accuchecks QAC and HS with low dose SSI.  Lantus 7 units SQ nightly.  Add on hemoglobin A1c level.                  #) GERD: documented h/o such; on prn Tums as outpatient.   Plan: Hold home prn Tums for now.  Protonix 40 mg IV daily, first dose now.                  #) Essential Hypertension: documented h/o such, with outpatient antihypertensive regimen including losartan, Coreg, chlorthalidone, Lasix.  SBP's in the ED today: 120s to 160s mmHg.   Plan: Close monitoring of subsequent BP via routine VS. in the setting of clinical evidence of dehydration, will hold him  chlorthalidone and Lasix for now.  Continue outpatient Coreg and losartan.                   #) Hyperlipidemia: documented h/o such. On high intensity atorvastatin as outpatient.  In the setting of presenting acute transaminitis, will hold outpatient statin for now, and add on CPK level.  Plan: Hold home statin.  Add on CPK level, as above.                #) Anemia of chronic disease: Documented history of such, a/w with baseline hgb range 10-12, with presenting hgb consistent with this range, in the absence of any overt evidence of active bleed.     Plan: Repeat CBC in the morning.  Check INR.       DVT prophylaxis: SCD's   Code Status: Full code Family Communication: none Disposition Plan: Per Rounding Team Consults called: none;  Admission status: obs     I SPENT GREATER THAN 75  MINUTES IN CLINICAL CARE TIME/MEDICAL DECISION-MAKING IN COMPLETING THIS ADMISSION.      Mike Born Benetta Maclaren DO [facility]  From 7PM - 7AM   07/14/2023, 8:45 PM

## 2023-07-14 NOTE — ED Triage Notes (Signed)
Per EMS and pt report, pt started having chest pain last night but was able to fall asleep. This morning he was still having the chest pain but became nauseous and diaphoretic. Has vomited 3x this morning. Pt has a hx of heart stents. Was given 324mg  aspirin and 1 nitroglycerin. Describes the pain as burning and currently rates it 8/10. Pain is in midsternal chest, abd pain, and lower back. Pt also given 4mg  IV zofran.

## 2023-07-15 DIAGNOSIS — R112 Nausea with vomiting, unspecified: Secondary | ICD-10-CM | POA: Diagnosis not present

## 2023-07-15 DIAGNOSIS — R0789 Other chest pain: Secondary | ICD-10-CM

## 2023-07-15 LAB — COMPREHENSIVE METABOLIC PANEL
ALT: 90 U/L — ABNORMAL HIGH (ref 0–44)
AST: 74 U/L — ABNORMAL HIGH (ref 15–41)
Albumin: 3.1 g/dL — ABNORMAL LOW (ref 3.5–5.0)
Alkaline Phosphatase: 69 U/L (ref 38–126)
Anion gap: 6 (ref 5–15)
BUN: 25 mg/dL — ABNORMAL HIGH (ref 8–23)
CO2: 28 mmol/L (ref 22–32)
Calcium: 8.7 mg/dL — ABNORMAL LOW (ref 8.9–10.3)
Chloride: 103 mmol/L (ref 98–111)
Creatinine, Ser: 2.19 mg/dL — ABNORMAL HIGH (ref 0.61–1.24)
GFR, Estimated: 31 mL/min — ABNORMAL LOW (ref >60)
Glucose, Bld: 143 mg/dL — ABNORMAL HIGH (ref 70–99)
Potassium: 3.7 mmol/L (ref 3.5–5.1)
Sodium: 137 mmol/L (ref 135–145)
Total Bilirubin: 1.3 mg/dL — ABNORMAL HIGH (ref 0.3–1.2)
Total Protein: 6.6 g/dL (ref 6.5–8.1)

## 2023-07-15 LAB — CBC WITH DIFFERENTIAL/PLATELET
Abs Immature Granulocytes: 0.03 10*3/uL (ref 0.00–0.07)
Basophils Absolute: 0 10*3/uL (ref 0.0–0.1)
Basophils Relative: 0 %
Eosinophils Absolute: 0.1 10*3/uL (ref 0.0–0.5)
Eosinophils Relative: 1 %
HCT: 28.4 % — ABNORMAL LOW (ref 39.0–52.0)
Hemoglobin: 9.6 g/dL — ABNORMAL LOW (ref 13.0–17.0)
Immature Granulocytes: 0 %
Lymphocytes Relative: 21 %
Lymphs Abs: 2.2 10*3/uL (ref 0.7–4.0)
MCH: 30.9 pg (ref 26.0–34.0)
MCHC: 33.8 g/dL (ref 30.0–36.0)
MCV: 91.3 fL (ref 80.0–100.0)
Monocytes Absolute: 1 10*3/uL (ref 0.1–1.0)
Monocytes Relative: 10 %
Neutro Abs: 6.8 10*3/uL (ref 1.7–7.7)
Neutrophils Relative %: 68 %
Platelets: 148 10*3/uL — ABNORMAL LOW (ref 150–400)
RBC: 3.11 MIL/uL — ABNORMAL LOW (ref 4.22–5.81)
RDW: 12.5 % (ref 11.5–15.5)
WBC: 10.2 10*3/uL (ref 4.0–10.5)
nRBC: 0 % (ref 0.0–0.2)

## 2023-07-15 LAB — PROTIME-INR
INR: 1.2 (ref 0.8–1.2)
Prothrombin Time: 15.8 s — ABNORMAL HIGH (ref 11.4–15.2)

## 2023-07-15 LAB — LIPASE, BLOOD: Lipase: 57 U/L — ABNORMAL HIGH (ref 11–51)

## 2023-07-15 LAB — GLUCOSE, CAPILLARY
Glucose-Capillary: 140 mg/dL — ABNORMAL HIGH (ref 70–99)
Glucose-Capillary: 193 mg/dL — ABNORMAL HIGH (ref 70–99)
Glucose-Capillary: 173 mg/dL — ABNORMAL HIGH (ref 70–99)
Glucose-Capillary: 165 mg/dL — ABNORMAL HIGH (ref 70–99)

## 2023-07-15 LAB — BILIRUBIN, DIRECT: Bilirubin, Direct: 0.2 mg/dL (ref 0.0–0.2)

## 2023-07-15 LAB — MAGNESIUM: Magnesium: 1.4 mg/dL — ABNORMAL LOW (ref 1.7–2.4)

## 2023-07-15 MED ORDER — MAGNESIUM SULFATE 2 GM/50ML IV SOLN
2.0000 g | Freq: Once | INTRAVENOUS | Status: AC
  Administered 2023-07-15: 2 g via INTRAVENOUS
  Filled 2023-07-15: qty 50

## 2023-07-15 MED ORDER — ALUM & MAG HYDROXIDE-SIMETH 200-200-20 MG/5ML PO SUSP
30.0000 mL | Freq: Four times a day (QID) | ORAL | Status: DC | PRN
  Administered 2023-07-15: 30 mL via ORAL
  Filled 2023-07-15: qty 30

## 2023-07-15 NOTE — Consult Note (Cosign Needed Addendum)
Cardiology Consultation   Patient ID: [name] MRN: [id number]; DOB: 05/11/1951  Admit date: 07/14/2023 Date of Consult: 07/15/2023  PCP:  Pincus Sanes, MD   [facility] Providers Cardiologist:  Chilton Si, MD        Patient Profile:   [name] is a 72 y.o. male with a hx of hypertension, hyperlipidemia, DM2, OSA, prior tobacco abuse, CKD, CVA with left extremity weakness, and history of CAD who is being seen 07/15/2023 for the evaluation of chest pain at the request of Dr. Jonathon Bellows.  History of Present Illness:   [name] is a 72 year old male with past medical history of hypertension, hyperlipidemia, DM2, OSA, prior tobacco abuse, CKD, CVA with left extremity weakness, and history of CAD.  Patient had BMS to OM while living in [place].  He had in-stent restenosis in 2005 and had DES placed.  He had NSTEMI in August 2021 and underwent repeat cardiac catheterization, this revealed a 95% stenosis in the ostial OM 2 just proximal to the previously placed OM stent.  This was treated with drug-eluting stent. He does have CTO of RPDA with distal collaterals. Repeat echocardiogram in September 2021 showed EF 55%, grade 1 DD.  Over the years, he had any stress testing, including March 2017, December 2019, September 2022 and the most recently February 2024.  Most recent Myoview in February 2024 showed small defect of mild reduction in uptake present in the apical inferior location that is fixed consistent with previous infarction, EF 52%.  This is unchanged when compared to the previous study.  Echocardiogram obtained on 02/15/2023 showed EF 55%, mild asymmetric LVH of basal septal segment, grade 1 DD, RVSP 22.2 mmHg, trivial MR.  He was most recently seen by Dr. Duke Salvia on 06/27/2023 at which time he continued to have left arm and shoulder tightness that has been going on for several months.  Symptom is constant but wax and wane.  This was felt to be noncardiac in  nature.  Patient was in his usual state of health until the night of 07/13/2023.  His daughter had some nausea after eating at [employer].  He purchased some chicken for the same [employer].  Afterward he had nausea and vomiting.  Prior to the onset of the nausea, he also had substernal chest discomfort.  The chest pain would last several hours at a time.  Current episode of chest discomfort has been going on since at least 12 hours ago.  He has been treated with antiemetic.  COVID test negative.  Blood work initial arrival showed creatinine of 1.9, repeat creatinine this morning was 2.19.  Magnesium 1.5.  Total CK 98.  TSH normal.  Hepatitis panel negative.  Hs trop 9 --> 8.  Due to persistent chest pain, cardiology service was consulted.  On physical exam, chest pain is worse with deep palpation.  It is also worse with cough. CT of abdomen showed possible psuedocyst vs indolent neoplasm, recommended repeat image.    Past Medical History:  Diagnosis Date   ALLERGIC RHINITIS    CAD, NATIVE VESSEL    BMS to OM1 2001, DES to BMS 2005   Chronic back pain    herniated disc   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   Chronic kidney disease    Constipation    takes Carafate four times day   DIAB W/UNSPEC COMP TYPE II/UNSPEC TYPE UNCNTRL    ERECTILE DYSFUNCTION    GERD    HYPERLIPIDEMIA-MIXED  HYPERTENSION, BENIGN    LBBB (left bundle branch block) 02/02/2016   Mike'S NEUROMA, RIGHT    OSA on CPAP 09/09/2020   Peripheral neuropathy    Seasonal allergies    takes Allegra and Benadryl daily prn;uses Flonase daily   SHOULDER PAIN, RIGHT    Snoring 09/09/2020   Stroke, 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, s/p CIR    TIA on medication 02/2012   Unstable angina (HCC) 07/09/2020    Past Surgical History:  Procedure Laterality Date   ANGIOPLASTY     COLONOSCOPY     CORONARY ANGIOPLASTY  2005   2 stents   CORONARY STENT INTERVENTION N/A 07/10/2020    Procedure: CORONARY STENT INTERVENTION;  Surgeon: Swaziland, Peter M, MD;  Location: [facility] INVASIVE CV LAB;  Service: Cardiovascular;  Laterality: N/A;   DENTAL SURGERY     LARYNGOPLASTY  08/07/2012   Procedure: LARYNGOPLASTY;  Surgeon: Serena Colonel, MD;  Location: [facility] OR;  Service: ENT;  Laterality: Left;  Left Vocal Cord Medialyzation   LEFT HEART CATH AND CORONARY ANGIOGRAPHY N/A 07/10/2020   Procedure: LEFT HEART CATH AND CORONARY ANGIOGRAPHY;  Surgeon: Swaziland, Peter M, MD;  Location: [facility] INVASIVE CV LAB;  Service: Cardiovascular;  Laterality: N/A;   left knee surgury     x 2    stent  2001, 2004   coronary stents     Home Medications:  Prior to Admission medications   Medication Sig Start Date End Date Taking? Authorizing Provider  aspirin 325 MG tablet Take 325 mg by mouth daily as needed for moderate pain.   Yes [provider]  aspirin EC 81 MG tablet Take 1 tablet (81 mg total) by mouth daily. Swallow whole. 07/10/20  Yes Dunn, Dayna N, PA-C  atorvastatin (LIPITOR) 40 MG tablet Take 1 tablet (40 mg total) by mouth daily. 05/03/23  Yes Alver Sorrow, NP  calcium carbonate (TUMS - DOSED IN MG ELEMENTAL CALCIUM) 500 MG chewable tablet Chew 1 tablet by mouth daily as needed for indigestion or heartburn.   Yes [provider]  carvedilol (COREG) 25 MG tablet Take 1 tablet (25 mg total) by mouth 2 (two) times daily. 01/25/23 01/20/24 Yes Alver Sorrow, NP  chlorthalidone (HYGROTON) 25 MG tablet TAKE 1 TABLET EVERY DAY 02/07/23  Yes Chilton Si, MD  clopidogrel (PLAVIX) 75 MG tablet TAKE 1 TABLET EVERY DAY. NEED APPOINTMENT FOR FURTHER REFILLS 02/16/23  Yes Chilton Si, MD  furosemide (LASIX) 40 MG tablet TAKE 1 TABLET (40 MG TOTAL) BY MOUTH DAILY. NEED APPT. 09/16/21  Yes Chilton Si, MD  insulin glargine (LANTUS SOLOSTAR) 100 UNIT/ML Solostar Pen Inject 14 Units into the skin daily. 04/05/23  Yes Carlus Pavlov, MD  insulin lispro (HUMALOG KWIKPEN) 100 UNIT/ML  KwikPen Inject 6-10 Units into the skin 2 (two) times daily before a meal. 04/05/23  Yes Carlus Pavlov, MD  losartan (COZAAR) 100 MG tablet Take 1 tablet (100 mg total) by mouth daily. 04/04/23  Yes Alver Sorrow, NP  nitroGLYCERIN (NITROSTAT) 0.4 MG SL tablet Place 1 tablet (0.4 mg total) under the tongue every 5 (five) minutes as needed for chest pain (up to 3 doses). 12/23/22 12/23/23 Yes Chilton Si, MD  senna-docusate (SENOKOT-S) 8.6-50 MG per tablet Take 2 tablets by mouth 2 (two) times daily. For constipation. 09/07/12  Yes Love, Evlyn Kanner, PA-C  tiZANidine (ZANAFLEX) 2 MG tablet Take 1 tablet (2 mg total) by mouth at bedtime as needed for muscle spasms. 03/04/23  Yes Pincus Sanes, MD  Blood Pressure Monitoring (BLOOD PRESSURE CUFF) MISC 1 Units by Does not apply route daily. 02/03/21   Chilton Si, MD  chlorhexidine (PERIDEX) 0.12 % solution Use as directed 15 mLs in the mouth or throat 2 (two) times daily. Patient not taking: Reported on 07/14/2023 12/21/22   [provider]  Continuous Blood Gluc Sensor (FREESTYLE LIBRE 2 SENSOR) MISC 1 Device by Does not apply route every 14 (fourteen) days. 11/03/21   Romero Belling, MD  glucose blood (ACCU-CHEK AVIVA PLUS) test strip uad tid 12/14/21   Pincus Sanes, MD  Insulin Pen Needle 32G X 4 MM MISC Use 3x a day 04/05/23   Carlus Pavlov, MD  Microlet Lancets MISC UAD to check sugars TID.  E11.22 12/14/21   Pincus Sanes, MD    Inpatient Medications: Scheduled Meds:  aspirin EC  81 mg Oral Daily   carvedilol  25 mg Oral BID   clopidogrel  75 mg Oral Daily   insulin aspart  0-9 Units Subcutaneous TID WC   insulin glargine-yfgn  7 Units Subcutaneous QHS   losartan  100 mg Oral Daily   pantoprazole (PROTONIX) IV  40 mg Intravenous Q24H   Continuous Infusions:  promethazine (PHENERGAN) injection (IM or IVPB)     PRN Meds: acetaminophen **OR** acetaminophen, alum & mag hydroxide-simeth, fentaNYL (SUBLIMAZE) injection,  LORazepam, melatonin, naLOXone (NARCAN)  injection, promethazine (PHENERGAN) injection (IM or IVPB)  Allergies:    Allergies  Allergen Reactions   Penicillins Anaphylaxis   Pneumococcal Vaccines Anaphylaxis   Shellfish Allergy Anaphylaxis   Barbiturates    Gabapentin     Urinary retention   Influenza Vaccines    Lisinopril Cough   Other Nausea And Vomiting    Pt wife states that dye that is used for eye exam in dr    Sildenafil Other (See Comments)    Other reaction(s): Headache, Dizziness   Topiramate Other (See Comments)    Chest spasms and numbness   Zofran [Ondansetron]    Amlodipine Other (See Comments)    LE edema   Levitra [Vardenafil] Other (See Comments)    headaches   Metformin Nausea And Vomiting   Tadalafil Other (See Comments)    dizziness    Social History:   Social History   Socioeconomic History   Marital status: Legally Separated    Spouse name: Not on file   Number of children: 3   Years of education: college   Highest education level: Not on file  Occupational History   Occupation: disabled    Employer: [employer]  Tobacco Use   Smoking status: Former    Current packs/day: 0.00    Average packs/day: 2.5 packs/day for 9.0 years (22.5 ttl pk-yrs)    Types: Cigarettes    Start date: 02/23/1974    Quit date: 02/24/1983    Years since quitting: 40.4   Smokeless tobacco: Never  Vaping Use   Vaping status: Never Used  Substance and Sexual Activity   Alcohol use: No    Alcohol/week: 0.0 standard drinks of alcohol   Drug use: No   Sexual activity: Never  Other Topics Concern   Not on file  Social History Narrative      Social Determinants of Health   Financial Resource Strain: Low Risk  (03/05/2022)   Overall Financial Resource Strain (CARDIA)    Difficulty of Paying Living Expenses: Not hard at all  Food Insecurity: No Food Insecurity (07/14/2023)   Hunger Vital Sign  Worried About Programme researcher, broadcasting/film/video in the Last Year: Never true    Ran Out  of Food in the Last Year: Never true  Transportation Needs: No Transportation Needs (07/14/2023)   PRAPARE - Administrator, Civil Service (Medical): No    Lack of Transportation (Non-Medical): No  Physical Activity: Sufficiently Active (03/05/2022)   Exercise Vital Sign    Days of Exercise per Week: 3 days    Minutes of Exercise per Session: 60 min  Stress: No Stress Concern Present (03/05/2022)   [employer] of Occupational Health - Occupational Stress Questionnaire    Feeling of Stress : Not at all  Social Connections: Socially Isolated (03/05/2022)   Social Connection and Isolation Panel [NHANES]    Frequency of Communication with Friends and Family: Three times a week    Frequency of Social Gatherings with Friends and Family: Three times a week    Attends Religious Services: Never    Active Member of Clubs or Organizations: No    Attends Banker Meetings: Never    Marital Status: Separated  Intimate Partner Violence: Not At Risk (07/14/2023)   Humiliation, Afraid, Rape, and Kick questionnaire    Fear of Current or Ex-Partner: No    Emotionally Abused: No    Physically Abused: No    Sexually Abused: No    Family History:    Family History  Problem Relation Age of Onset   Cancer Mother    Liver cancer Brother    Colon cancer Neg Hx    Esophageal cancer Neg Hx      ROS:  Please see the history of present illness.   All other ROS reviewed and negative.     Physical Exam/Data:   Vitals:   07/14/23 2030 07/14/23 2112 07/15/23 0559 07/15/23 0754  BP: (!) 155/85 135/68 121/62 128/77  Pulse: 89 81 76 73  Resp: 17 18 17 18   Temp:  99 F (37.2 C) 99.1 F (37.3 C) 99 F (37.2 C)  TempSrc:  Oral Oral Oral  SpO2: 100% 100% 99% 98%  Weight:      Height:       No intake or output data in the 24 hours ending 07/15/23 1534    07/14/2023   12:13 PM 06/27/2023   10:55 AM 04/26/2023    1:20 PM  Last 3 Weights  Weight (lbs) 210 lb 214 lb 11.2 oz 211  lb  Weight (kg) 95.255 kg 97.387 kg 95.709 kg     Body mass index is 29.29 kg/m.  General:  Well nourished, well developed, in no acute distress HEENT: normal Neck: no JVD Vascular: No carotid bruits; Distal pulses 2+ bilaterally Cardiac:  normal S1, S2; RRR; no murmur  Lungs:  clear to auscultation bilaterally, no wheezing, rhonchi or rales  Abd: soft, nontender, no hepatomegaly  Ext: no edema Musculoskeletal:  No deformities, BUE and BLE strength normal and equal Skin: warm and dry  Neuro:  CNs 2-12 intact, no focal abnormalities noted Psych:  Normal affect   EKG:  The EKG was personally reviewed and demonstrates: Normal sinus rhythm, LBBB Telemetry:  Telemetry was personally reviewed and demonstrates: Normal sinus rhythm, no significant ventricular ectopy  Relevant CV Studies:  Cath 07/10/2020 2nd Mrg-1 lesion is 95% stenosed. 2nd Mrg-2 lesion is 30% stenosed. RPDA lesion is 100% stenosed. A drug-eluting stent was successfully placed using a STENT RESOLUTE ONYX 2.75X8. Post intervention, there is a 0% residual stenosis. Post intervention, there is a  0% residual stenosis. LV end diastolic pressure is normal.   1. 2 vessel obstructive CAD    -95% ostial OM2. This was at the proximal margin of the old stents    - 100% distal PDA. This fills by left to right collaterals. 2. Normal LVEDP 3. Successful PCI of the ostium of the OM with DES x 1.   Plan: DAPT indefinitely given multiple stents in OM. Anticipate same day DC. Will hold losartan and HCTZ until renal function repeated as outpatient.      Myoview 01/05/2023   Findings are consistent with infarction. The study is low risk.   No ST deviation was noted.   LV perfusion is abnormal. There is no evidence of ischemia. There is evidence of infarction. Defect 1: There is a small defect with mild reduction in uptake present in the apical inferior location(s) that is fixed. There is abnormal wall motion in the defect area.  Consistent with infarction.   Left ventricular function is abnormal. Global function is mildly reduced. Nuclear stress EF: 52 %. The left ventricular ejection fraction is mildly decreased (45-54%). End diastolic cavity size is normal. End systolic cavity size is normal.   Prior study available for comparison from 08/14/2021. No changes compared to prior study.   Echo 02/15/2023  1. Left ventricular ejection fraction, by estimation, is 55%. The left  ventricle has normal function. The left ventricle has no regional wall  motion abnormalities. There is mild asymmetric left ventricular  hypertrophy of the basal-septal segment. Left  ventricular diastolic parameters are consistent with Grade I diastolic  dysfunction (impaired relaxation). There is septal-lateral dyssynchrony  secondary to LBBB.   2. Right ventricular systolic function is normal. The right ventricular  size is normal. There is normal pulmonary artery systolic pressure. The  estimated right ventricular systolic pressure is 22.2 mmHg.   3. The mitral valve is normal in structure. Trivial mitral valve  regurgitation.   4. The aortic valve is tricuspid. There is mild calcification of the  aortic valve. There is mild thickening of the aortic valve. Aortic valve  regurgitation is not visualized.   5. The inferior vena cava is normal in size with greater than 50%  respiratory variability, suggesting right atrial pressure of 3 mmHg.   Comparison(s): No significant change from prior study.   Laboratory Data:  High Sensitivity Troponin:   Recent Labs  Lab 07/14/23 1251 07/14/23 1440  TROPONINIHS 9 8     Chemistry Recent Labs  Lab 07/14/23 1251 07/14/23 1945 07/15/23 0553  NA 138  --  137  K 4.1  --  3.7  CL 102  --  103  CO2 24  --  28  GLUCOSE 216*  --  143*  BUN 28*  --  25*  CREATININE 1.99*  --  2.19*  CALCIUM 9.4  --  8.7*  MG  --  1.5* 1.4*  GFRNONAA 35*  --  31*  ANIONGAP 12  --  6    Recent Labs  Lab  07/14/23 1251 07/15/23 0553  PROT 7.4 6.6  ALBUMIN 3.6 3.1*  AST 134* 74*  ALT 89* 90*  ALKPHOS 78 69  BILITOT 1.5* 1.3*   Lipids No results for input(s): "CHOL", "TRIG", "HDL", "LABVLDL", "LDLCALC", "CHOLHDL" in the last 168 hours.  Hematology Recent Labs  Lab 07/14/23 1251 07/15/23 0553  WBC 8.4 10.2  RBC 3.38* 3.11*  HGB 10.6* 9.6*  HCT 32.0* 28.4*  MCV 94.7 91.3  MCH 31.4 30.9  MCHC 33.1 33.8  RDW 12.3 12.5  PLT 151 148*   Thyroid  Recent Labs  Lab 07/14/23 1945  TSH 1.973    BNPNo results for input(s): "BNP", "PROBNP" in the last 168 hours.  DDimer No results for input(s): "DDIMER" in the last 168 hours.   Radiology/Studies:  US Abdomen Limited RUQ (LIVER/GB)  Result Date: 07/14/2023 CLINICAL DATA:  Abdominal pain. EXAM: ULTRASOUND ABDOMEN LIMITED RIGHT UPPER QUADRANT COMPARISON:  None Available. FINDINGS: Gallbladder: Lobulated, nonshadowing echogenic material is seen within the dependent portion of a moderately distended gallbladder lumen. The largest measures approximately 1.6 cm. This area demonstrates no flow on color Doppler evaluation. There is no evidence of gallbladder wall thickening (2.4 mm). No sonographic Murphy sign noted by sonographer. Common bile duct: Diameter: 4.8 mm Liver: No focal lesion identified. Within normal limits in parenchymal echogenicity. Portal vein is patent on color Doppler imaging with normal direction of blood flow towards the liver. Other: None. IMPRESSION: Findings likely consistent with cholelithiasis and/or tumefactive sludge within the gallbladder lumen, without evidence of acute cholecystitis. Correlation with 6-12 week follow-up right upper quadrant ultrasound is recommended to determine stability. Electronically Signed   By: Aram Candela M.D.   On: 07/14/2023 18:56   CT ABDOMEN PELVIS W CONTRAST  Result Date: 07/14/2023 CLINICAL DATA:  Acute generalized abdominal pain. EXAM: CT ABDOMEN AND PELVIS WITH CONTRAST TECHNIQUE:  Multidetector CT imaging of the abdomen and pelvis was performed using the standard protocol following bolus administration of intravenous contrast. RADIATION DOSE REDUCTION: This exam was performed according to the departmental dose-optimization program which includes automated exposure control, adjustment of the mA and/or kV according to patient size and/or use of iterative reconstruction technique. CONTRAST:  65mL OMNIPAQUE IOHEXOL 350 MG/ML SOLN COMPARISON:  Apr 20, 2023. FINDINGS: Lower chest: No acute abnormality. Hepatobiliary: Minimal cholelithiasis. No biliary dilatation. Liver is unremarkable. Pancreas: Stable 20 x 11 mm low density seen in uncinate process of pancreatic head. No acute abnormality is noted. Spleen: Normal in size without focal abnormality. Adrenals/Urinary Tract: Adrenal glands are unremarkable. Kidneys are normal, without renal calculi, focal lesion, or hydronephrosis. Bladder is unremarkable. Stomach/Bowel: Stomach is within normal limits. Appendix appears normal. No evidence of bowel wall thickening, distention, or inflammatory changes. Vascular/Lymphatic: No significant vascular findings are present. No enlarged abdominal or pelvic lymph nodes. Reproductive: Prostate is unremarkable. Other: Small fat containing left inguinal hernia. No ascites is noted. Musculoskeletal: No acute or significant osseous findings. IMPRESSION: Grossly stable 20 x 11 mm low density seen in uncinate process of pancreatic head. 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 Per consensus criteria, follow-up with preferably pre and post contrast abdominal MRI at 1 year is recommended. This recommendation follows ACR consensus guidelines: Managing Incidental Findings on Abdominal CT: White Paper of the ACR Incidental Findings Committee. J Am Coll Radiol 2010;7:754-773. Minimal cholelithiasis. Small fat containing left inguinal hernia. Electronically  Signed   By: Lupita Raider M.D.   On: 07/14/2023 16:27   DG Chest 2 View  Result Date: 07/14/2023 CLINICAL DATA:  Chest pain and vomiting EXAM: CHEST - 2 VIEW COMPARISON:  X-ray 03/29/2022 FINDINGS: Borderline cardiopericardial silhouette. No consolidation, pneumothorax or effusion. No edema. Degenerative changes of the spine. Overlapping cardiac leads. IMPRESSION: Borderline size heart.  No acute cardiopulmonary disease. Electronically Signed   By: Karen Kays M.D.   On: 07/14/2023 13:36     Assessment and Plan:   Atypical chest pain  -EKG  shows sinus rhythm with left bundle branch block.  Unchanged when compared to the previous EKG.  -Despite hours of chest pain, serial troponin was 9-->8  -Currently continued to have 7 out of 10 chest pain.  Worse with cough and deep palpation.  Will discuss with MD, echocardiogram has been ordered by primary team, if EF is unchanged, would not recommend any further workup given atypical nature of the symptom.  Would like to avoid any invasive study given poor renal function.  Intractable nausea vomiting: Daughter had similar symptom after eating from the same restaurant a day prior.  Improved.  No further vomiting today.  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  History of recurrent stenosis in OM.  Aspirin and Plavix.  Chronic anemia: Hemoglobin stable   CKD stage III-IV: Creatinine 1.9-->2.1.  History of CVA: has chronic left sided weakness. Walks with a cane  Hypertension: On carvedilol, chlorthalidone and losartan at home.  Hyperlipidemia: On Lipitor 40 mg daily  DM2   Risk Assessment/Risk Scores:                For questions or updates, please contact [facility] Please consult [email or web address] for contact info under    Ramond Dial, [place]  07/15/2023 3:34 PM

## 2023-07-15 NOTE — Hospital Course (Addendum)
71YOM W/ HFpEF, CKD 3B B/L creat~1.8-2, GERD, type 2 diabetes mellitus, CAD w/ stents x3 first one in 1997 then last one was in 2013 presented with intractable nausea vomiting with epigastric pain, chest pain, diaphoresis. In ED, labs showed creatinine 1.9, mild hypomagnesemia, mild transaminitis chronic anemia, troponin 9>8.  UA unremarkable. Chest x-ray-no significant findings Abdomen pelvis with contrast:Grossly stable 20 x 11 mm low density seen in uncinate process of pancreatic head. 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 Indolent neoplasm, such as intraductal papillary mucinous tumor could look similar [place] abd: Cholelithiasis and/or tumefactive sludge within the gallbladder lumen without evidence of acute cholecystitis follow-up with 6 to 12-week ultrasound advised Symptomatically managed and admitted for further management.   Also seen by cardiology due to ongoing chest pain -Per cardiology no need for further echo or workup and patient can be discharged. Patient now tolerating diet no abdominal pain nausea vomiting.?  Gastroenteritis LFTs have normalized-downtrending.  If he is well and agrees for discharge he does have cholelithiasis and he needs follow-up w/ PCP.Surgery

## 2023-07-15 NOTE — Progress Notes (Signed)
PROGRESS NOTE [name]  ZOX:[id number] DOB: Aug 10, 1951 DOA: 07/14/2023 PCP: Pincus Sanes, MD  Brief Narrative/Hospital Course: 71YOM W/ HFpEF, CKD 3B B/L creat~1.8-2, GERD, type 2 diabetes mellitus, CAD w/ stents x3 first one in 1997 then last one was in 2013 presented with intractable nausea vomiting with epigastric pain, chest pain, diaphoresis. In ED, labs showed creatinine 1.9, mild hypomagnesemia, mild transaminitis chronic anemia, troponin 9>8.  UA unremarkable. Chest x-ray-no significant findings Abdomen pelvis with contrast:Grossly stable 20 x 11 mm low density seen in uncinate process of pancreatic head. 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 Indolent neoplasm, such as intraductal papillary mucinous tumor could look similar [place] abd: Cholelithiasis and/or tumefactive sludge within the gallbladder lumen without evidence of acute cholecystitis follow-up with 6 to 12-week ultrasound advised Symptomatically managed and admitted for further management.       Subjective: Seen and examined this am C/O ss pain radiating to back. Normally he sees Dr Duke Salvia Wife at bedside. Trying to have meal.    Assessment and Plan: Principal Problem:   Intractable nausea and vomiting Active Problems:   DM2 (diabetes mellitus, type 2) (HCC)   Hyperlipidemia   Essential hypertension   GERD (gastroesophageal reflux disease)   Chronic diastolic CHF (congestive heart failure) (HCC)   Anemia of chronic disease   CKD stage 3b, GFR 30-44 ml/min (HCC)   Epigastric pain   Transaminitis  Chest pain CAD w/ previous stents: In the setting of intermittent nausea vomiting but does have significant CAD with stents.  Still having chest discomfort, cardiology consulted although troponin negative x2.Continue aspirin Coreg Plavix losartan. Ordered TTE.  Intractable nausea vomiting epigastric pain Epigastric pain GERD Cholelithiasis: Cholelithiasis: Ultrasound abdomen, CT abdomen reviewed stable chronic  finding.  No right upper quadrant tenderness LFTs downtrending , trend.  Continue PPI, continue diet as tolerated.  Has not needed IV or oral narcotic for pain  Hypomagnesemia-repleted. CKD stage IIIb baseline creatinine 1.8-2.2, stable Anemia, hemoglobin holding 9 to 10 g range, monitor, likely from CKD  Chronic diastolic CHF: Monitor fluid status volume status and weight Hypertension: BP stable continue Coreg and losartan.  Holding chlorthalidone, Lasix Hyperlipidemia: Hold statin due to LFTs elevation  DVT prophylaxis: SCDs Start: 07/14/23 1944 Code Status:   Code Status: Full Code Family Communication: plan of care discussed with patient/wife at bedside. Patient status is:  admitted as observation but remains hospitalized for ongoing  because of ongoing chest pain  epigastric discomfort  Level of care: Telemetry Medical   Dispo: The patient is from: home            Anticipated disposition: TBD Objective: Vitals last 24 hrs: Vitals:   07/14/23 2030 07/14/23 2112 07/15/23 0559 07/15/23 0754  BP: (!) 155/85 135/68 121/62 128/77  Pulse: 89 81 76 73  Resp: 17 18 17 18   Temp:  99 F (37.2 C) 99.1 F (37.3 C) 99 F (37.2 C)  TempSrc:  Oral Oral Oral  SpO2: 100% 100% 99% 98%  Weight:      Height:       Weight change:   Physical Examination: General exam: alert awake, older than stated age HEENT:Oral mucosa moist, Ear/Nose WNL grossly Respiratory system: bilaterally CLEAR BS, no use of accessory muscle Cardiovascular system: S1 & S2 +, No JVD. Gastrointestinal system: Abdomen soft,NT,ND, BS+ Nervous System:Alert, awake, moving extremities. Extremities: LE edema neg,distal peripheral pulses palpable.  Skin: No rashes,no icterus. MSK: Normal muscle bulk,tone, power  Medications reviewed:  Scheduled Meds:  aspirin EC  81 mg Oral  Daily   carvedilol  25 mg Oral BID   clopidogrel  75 mg Oral Daily   insulin aspart  0-9 Units Subcutaneous TID WC   insulin glargine-yfgn  7  Units Subcutaneous QHS   losartan  100 mg Oral Daily   pantoprazole (PROTONIX) IV  40 mg Intravenous Q24H  Continuous Infusions:  lactated ringers 100 mL/hr at 07/14/23 2121   promethazine (PHENERGAN) injection (IM or IVPB)      Diet Order             Diet regular Room service appropriate? Yes; Fluid consistency: Thin  Diet effective now                  Intake/Output Summary (Last 24 hours) at 07/15/2023 1257 Last data filed at 07/14/2023 1456 Gross per 24 hour  Intake 500 ml  Output 300 ml  Net 200 ml   Net IO Since Admission: 200 mL [07/15/23 1257]  Wt Readings from Last 3 Encounters:  07/14/23 95.3 kg  06/27/23 97.4 kg  04/26/23 95.7 kg     Unresulted Labs (From admission, onward)     Start     Ordered   07/16/23 0500  CBC  Tomorrow morning,   R       Question:  Specimen collection method  Answer:  Lab=Lab collect   07/15/23 1101   07/16/23 0500  Comprehensive metabolic panel  Tomorrow morning,   R       Question:  Specimen collection method  Answer:  Lab=Lab collect   07/15/23 1101   07/14/23 2028  Rapid urine drug screen (hospital performed)  Add-on,   AD        07/14/23 2027          Data Reviewed: I have personally reviewed following labs and imaging studies CBC: Recent Labs  Lab 07/14/23 1251 07/15/23 0553  WBC 8.4 10.2  NEUTROABS 6.3 6.8  HGB 10.6* 9.6*  HCT 32.0* 28.4*  MCV 94.7 91.3  PLT 151 148*   Basic Metabolic Panel: Recent Labs  Lab 07/14/23 1251 07/14/23 1945 07/15/23 0553  NA 138  --  137  K 4.1  --  3.7  CL 102  --  103  CO2 24  --  28  GLUCOSE 216*  --  143*  BUN 28*  --  25*  CREATININE 1.99*  --  2.19*  CALCIUM 9.4  --  8.7*  MG  --  1.5* 1.4*  GFR: Estimated Creatinine Clearance: 35.4 mL/min (A) (by C-G formula based on SCr of 2.19 mg/dL (H)). Liver Function Tests: Recent Labs  Lab 07/14/23 1251 07/15/23 0553  AST 134* 74*  ALT 89* 90*  ALKPHOS 78 69  BILITOT 1.5* 1.3*  PROT 7.4 6.6  ALBUMIN 3.6 3.1*    Recent Labs  Lab 07/14/23 1251 07/15/23 0553  LIPASE 34 57*   Recent Labs    07/14/23 1251  HGBA1C 8.3*   CBG: Recent Labs  Lab 07/14/23 1233 07/14/23 2122 07/15/23 0754  GLUCAP 201* 167* 140*   Recent Labs    07/14/23 1945  TSH 1.973  Sepsis Labs: No results for input(s): "PROCALCITON", "LATICACIDVEN" in the last 168 hours.  Recent Results (from the past 240 hour(s))  SARS Coronavirus 2 by RT PCR (hospital order, performed in [facility] hospital lab) *cepheid single result test* Anterior Nasal Swab     Status: None   Collection Time: 07/14/23 12:55 PM   Specimen: Anterior Nasal Swab  Result Value Ref  Range Status   SARS Coronavirus 2 by RT PCR NEGATIVE NEGATIVE Final    Comment: Performed at [facility], [address], [place], [place] [address]    Antimicrobials: Anti-infectives (From admission, onward)    None      Culture/Microbiology No results found for: "SDES", "SPECREQUEST", "CULT", "REPTSTATUS"  Radiology Studies: US Abdomen Limited RUQ (LIVER/GB)  Result Date: 07/14/2023 CLINICAL DATA:  Abdominal pain. EXAM: ULTRASOUND ABDOMEN LIMITED RIGHT UPPER QUADRANT COMPARISON:  None Available. FINDINGS: Gallbladder: Lobulated, nonshadowing echogenic material is seen within the dependent portion of a moderately distended gallbladder lumen. The largest measures approximately 1.6 cm. This area demonstrates no flow on color Doppler evaluation. There is no evidence of gallbladder wall thickening (2.4 mm). No sonographic Murphy sign noted by sonographer. Common bile duct: Diameter: 4.8 mm Liver: No focal lesion identified. Within normal limits in parenchymal echogenicity. Portal vein is patent on color Doppler imaging with normal direction of blood flow towards the liver. Other: None. IMPRESSION: Findings likely consistent with cholelithiasis and/or tumefactive sludge within the gallbladder lumen, without evidence of acute cholecystitis. Correlation with 6-12 week  follow-up right upper quadrant ultrasound is recommended to determine stability. Electronically Signed   By: Aram Candela M.D.   On: 07/14/2023 18:56   CT ABDOMEN PELVIS W CONTRAST  Result Date: 07/14/2023 CLINICAL DATA:  Acute generalized abdominal pain. EXAM: CT ABDOMEN AND PELVIS WITH CONTRAST TECHNIQUE: Multidetector CT imaging of the abdomen and pelvis was performed using the standard protocol following bolus administration of intravenous contrast. RADIATION DOSE REDUCTION: This exam was performed according to the departmental dose-optimization program which includes automated exposure control, adjustment of the mA and/or kV according to patient size and/or use of iterative reconstruction technique. CONTRAST:  65mL OMNIPAQUE IOHEXOL 350 MG/ML SOLN COMPARISON:  Apr 20, 2023. FINDINGS: Lower chest: No acute abnormality. Hepatobiliary: Minimal cholelithiasis. No biliary dilatation. Liver is unremarkable. Pancreas: Stable 20 x 11 mm low density seen in uncinate process of pancreatic head. No acute abnormality is noted. Spleen: Normal in size without focal abnormality. Adrenals/Urinary Tract: Adrenal glands are unremarkable. Kidneys are normal, without renal calculi, focal lesion, or hydronephrosis. Bladder is unremarkable. Stomach/Bowel: Stomach is within normal limits. Appendix appears normal. No evidence of bowel wall thickening, distention, or inflammatory changes. Vascular/Lymphatic: No significant vascular findings are present. No enlarged abdominal or pelvic lymph nodes. Reproductive: Prostate is unremarkable. Other: Small fat containing left inguinal hernia. No ascites is noted. Musculoskeletal: No acute or significant osseous findings. IMPRESSION: Grossly stable 20 x 11 mm low density seen in uncinate process of pancreatic head. 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 Per consensus criteria, follow-up with preferably pre and post  contrast abdominal MRI at 1 year is recommended. This recommendation follows ACR consensus guidelines: Managing Incidental Findings on Abdominal CT: White Paper of the ACR Incidental Findings Committee. J Am Coll Radiol 2010;7:754-773. Minimal cholelithiasis. Small fat containing left inguinal hernia. Electronically Signed   By: Lupita Raider M.D.   On: 07/14/2023 16:27   DG Chest 2 View  Result Date: 07/14/2023 CLINICAL DATA:  Chest pain and vomiting EXAM: CHEST - 2 VIEW COMPARISON:  X-ray 03/29/2022 FINDINGS: Borderline cardiopericardial silhouette. No consolidation, pneumothorax or effusion. No edema. Degenerative changes of the spine. Overlapping cardiac leads. IMPRESSION: Borderline size heart.  No acute cardiopulmonary disease. Electronically Signed   By: Karen Kays M.D.   On: 07/14/2023 13:36     LOS: 0 days  Lanae Boast, MD [facility]  07/15/2023, 12:57 PM

## 2023-07-16 ENCOUNTER — Other Ambulatory Visit (HOSPITAL_COMMUNITY): Payer: Medicare HMO

## 2023-07-16 DIAGNOSIS — R112 Nausea with vomiting, unspecified: Secondary | ICD-10-CM | POA: Diagnosis not present

## 2023-07-16 LAB — COMPREHENSIVE METABOLIC PANEL
ALT: 58 U/L — ABNORMAL HIGH (ref 0–44)
AST: 40 U/L (ref 15–41)
Albumin: 3.1 g/dL — ABNORMAL LOW (ref 3.5–5.0)
Alkaline Phosphatase: 64 U/L (ref 38–126)
Anion gap: 9 (ref 5–15)
BUN: 19 mg/dL (ref 8–23)
CO2: 26 mmol/L (ref 22–32)
Calcium: 8.6 mg/dL — ABNORMAL LOW (ref 8.9–10.3)
Chloride: 101 mmol/L (ref 98–111)
Creatinine, Ser: 1.87 mg/dL — ABNORMAL HIGH (ref 0.61–1.24)
GFR, Estimated: 37 mL/min — ABNORMAL LOW (ref >60)
Glucose, Bld: 175 mg/dL — ABNORMAL HIGH (ref 70–99)
Potassium: 3.6 mmol/L (ref 3.5–5.1)
Sodium: 136 mmol/L (ref 135–145)
Total Bilirubin: 1.3 mg/dL — ABNORMAL HIGH (ref 0.3–1.2)
Total Protein: 6.5 g/dL (ref 6.5–8.1)

## 2023-07-16 LAB — GLUCOSE, CAPILLARY
Glucose-Capillary: 175 mg/dL — ABNORMAL HIGH (ref 70–99)
Glucose-Capillary: 207 mg/dL — ABNORMAL HIGH (ref 70–99)

## 2023-07-16 LAB — CBC
HCT: 27.7 % — ABNORMAL LOW (ref 39.0–52.0)
Hemoglobin: 9.3 g/dL — ABNORMAL LOW (ref 13.0–17.0)
MCH: 31.1 pg (ref 26.0–34.0)
MCHC: 33.6 g/dL (ref 30.0–36.0)
MCV: 92.6 fL (ref 80.0–100.0)
Platelets: 132 10*3/uL — ABNORMAL LOW (ref 150–400)
RBC: 2.99 MIL/uL — ABNORMAL LOW (ref 4.22–5.81)
RDW: 12.6 % (ref 11.5–15.5)
WBC: 9.9 10*3/uL (ref 4.0–10.5)
nRBC: 0 % (ref 0.0–0.2)

## 2023-07-16 MED ORDER — PANTOPRAZOLE SODIUM 40 MG PO TBEC: 40.0000 mg | DELAYED_RELEASE_TABLET | Freq: Every day | ORAL | 0 refills | Status: DC

## 2023-07-16 MED ORDER — POLYETHYLENE GLYCOL 3350 17 G PO PACK: 17.0000 g | PACK | Freq: Every day | ORAL | 0 refills | Status: DC

## 2023-07-16 MED ORDER — BISACODYL 10 MG RE SUPP
10.0000 mg | Freq: Once | RECTAL | Status: AC
  Administered 2023-07-16: 10 mg via RECTAL
  Filled 2023-07-16: qty 1

## 2023-07-16 NOTE — Plan of Care (Signed)
A/ox4 and on room air. No complaints of pain overnight, although patient does endorse a sharp epigastric "pinch" after drinking. ACHS. Up with cane or walker. Pending TEE.    Problem: Education: Goal: Ability to describe self-care measures that may prevent or decrease complications (Diabetes Survival Skills Education) will improve Outcome: Progressing Goal: Individualized Educational Video(s) Outcome: Progressing   Problem: Coping: Goal: Ability to adjust to condition or change in health will improve Outcome: Progressing   Problem: Fluid Volume: Goal: Ability to maintain a balanced intake and output will improve Outcome: Progressing   Problem: Health Behavior/Discharge Planning: Goal: Ability to identify and utilize available resources and services will improve Outcome: Progressing Goal: Ability to manage health-related needs will improve Outcome: Progressing   Problem: Metabolic: Goal: Ability to maintain appropriate glucose levels will improve Outcome: Progressing   Problem: Nutritional: Goal: Maintenance of adequate nutrition will improve Outcome: Progressing Goal: Progress toward achieving an optimal weight will improve Outcome: Progressing   Problem: Skin Integrity: Goal: Risk for impaired skin integrity will decrease Outcome: Progressing   Problem: Tissue Perfusion: Goal: Adequacy of tissue perfusion will improve Outcome: Progressing   Problem: Education: Goal: Knowledge of General Education information will improve Description: Including pain rating scale, medication(s)/side effects and non-pharmacologic comfort measures Outcome: Progressing   Problem: Health Behavior/Discharge Planning: Goal: Ability to manage health-related needs will improve Outcome: Progressing   Problem: Clinical Measurements: Goal: Ability to maintain clinical measurements within normal limits will improve Outcome: Progressing Goal: Will remain free from infection Outcome:  Progressing Goal: Diagnostic test results will improve Outcome: Progressing Goal: Respiratory complications will improve Outcome: Progressing Goal: Cardiovascular complication will be avoided Outcome: Progressing   Problem: Activity: Goal: Risk for activity intolerance will decrease Outcome: Progressing   Problem: Nutrition: Goal: Adequate nutrition will be maintained Outcome: Progressing   Problem: Coping: Goal: Level of anxiety will decrease Outcome: Progressing   Problem: Elimination: Goal: Will not experience complications related to bowel motility Outcome: Progressing Goal: Will not experience complications related to urinary retention Outcome: Progressing   Problem: Pain Managment: Goal: General experience of comfort will improve Outcome: Progressing   Problem: Safety: Goal: Ability to remain free from injury will improve Outcome: Progressing   Problem: Skin Integrity: Goal: Risk for impaired skin integrity will decrease Outcome: Progressing

## 2023-07-16 NOTE — Discharge Summary (Signed)
Physician Discharge Summary  [name] LKG:[id number] DOB: 08-31-1951 DOA: 07/14/2023  PCP: Pincus Sanes, MD  Admit date: 07/14/2023 Discharge date: 07/16/2023 Recommendations for Outpatient Follow-up:  Follow up with PCP in 1 weeks-call for appointment Please obtain BMP/CBC in one week  Discharge Dispo: home Discharge Condition: Stable Code Status:   Code Status: Full Code Diet recommendation:  Diet Order             Diet regular Room service appropriate? Yes; Fluid consistency: Thin  Diet effective now                    Brief/Interim Summary: 71YOM W/ HFpEF, CKD 3B B/L creat~1.8-2, GERD, type 2 diabetes mellitus, CAD w/ stents x3 first one in 1997 then last one was in 2013 presented with intractable nausea vomiting with epigastric pain, chest pain, diaphoresis. In ED, labs showed creatinine 1.9, mild hypomagnesemia, mild transaminitis chronic anemia, troponin 9>8.  UA unremarkable. Chest x-ray-no significant findings Abdomen pelvis with contrast:Grossly stable 20 x 11 mm low density seen in uncinate process of pancreatic head. 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 Indolent neoplasm, such as intraductal papillary mucinous tumor could look similar [place] abd: Cholelithiasis and/or tumefactive sludge within the gallbladder lumen without evidence of acute cholecystitis follow-up with 6 to 12-week ultrasound advised Symptomatically managed and admitted for further management.   Also seen by cardiology due to ongoing chest pain echocardiogram obtained and if normal no further workup as per cardiology.  Patient now tolerating diet no abdominal pain nausea vomiting.?  Gastroenteritis LFTs have normalized-downtrending.  If he is well and agrees for discharge he does have cholelithiasis and he needs follow-up w/ PCP.Surgery    Discharge Diagnoses:  Principal Problem:   Intractable nausea and vomiting Active Problems:   DM2 (diabetes mellitus, type 2) (HCC)   Hyperlipidemia   Essential  hypertension   GERD (gastroesophageal reflux disease)   Chronic diastolic CHF (congestive heart failure) (HCC)   Anemia of chronic disease   CKD stage 3b, GFR 30-44 ml/min (HCC)   Epigastric pain   Transaminitis  Chest pain CAD w/ previous stents: In the setting of intermittent nausea vomiting but does have significant CAD with stents.  Still having chest discomfort, cardiology consulted although troponin negative x2.Continue aspirin Coreg Plavix losartan.-Per cardiology no need for further echo or workup and patient can be discharged.   Intractable nausea vomiting epigastric pain Epigastric pain GERD Cholelithiasis: Cholelithiasis: Ultrasound abdomen, CT abdomen reviewed stable chronic finding.  No right upper quadrant tenderness LFTs downtrending significantly normalized except for ALT . Continue PPI, advised outpatient follow-up with surgery/PCP  Constipation: cont stool softener added MiraLAX daily  Hypomagnesemia-repleted. CKD stage IIIb baseline creatinine 1.8-2.2, stable now at 1.8 Anemia, hemoglobin holding 9 to 10 g range, monitor, likely from CKD Recent Labs  Lab 07/14/23 1251 07/15/23 0553 07/16/23 0419  HGB 10.6* 9.6* 9.3*  HCT 32.0* 28.4* 27.7*     Chronic diastolic CHF: Euvolemic.   Hypertension: BP stable continue Coreg and losartan.  Held chlorthalidone, Lasix> resume upon discharge Hyperlipidemia: Held statin due to LFTs elevation> resume upon discharge  Consults: Cardiology Subjective: Alert oriented no abdominal pain chest pain tolerating diet  Discharge Exam: Vitals:   07/16/23 0629 07/16/23 0802  BP: 123/67 139/61  Pulse: 70 70  Resp: 18 19  Temp: 98.9 F (37.2 C) 98.2 F (36.8 C)  SpO2: 100% 100%   General: Pt is alert, awake, not in acute distress Cardiovascular: RRR, S1/S2 +, no rubs, no gallops Respiratory:  CTA bilaterally, no wheezing, no rhonchi Abdominal: Soft, NT, ND, bowel sounds + Extremities: no edema, no cyanosis  Discharge  Instructions  Discharge Instructions     Discharge instructions   Complete by: As directed    Please call call MD or return to ER for similar or worsening recurring problem that brought you to hospital or if any fever,nausea/vomiting,abdominal pain, uncontrolled pain, chest pain,  shortness of breath or any other alarming symptoms.  Please follow-up your doctor as instructed in a week time and call the office for appointment.  Please avoid alcohol, smoking, or any other illicit substance and maintain healthy habits including taking your regular medications as prescribed.  You were cared for by a hospitalist during your hospital stay. If you have any questions about your discharge medications or the care you received while you were in the hospital after you are discharged, you can call the unit and ask to speak with the hospitalist on call if the hospitalist that took care of you is not available.  Once you are discharged, your primary care physician will handle any further medical issues. Please note that NO REFILLS for any discharge medications will be authorized once you are discharged, as it is imperative that you return to your primary care physician (or establish a relationship with a primary care physician if you do not have one) for your aftercare needs so that they can reassess your need for medications and monitor your lab values   Increase activity slowly   Complete by: As directed       Allergies as of 07/16/2023       Reactions   Penicillins Anaphylaxis   Pneumococcal Vaccines Anaphylaxis   Shellfish Allergy Anaphylaxis   Barbiturates    Gabapentin    Urinary retention   Influenza Vaccines    Lisinopril Cough   Other Nausea And Vomiting   Pt wife states that dye that is used for eye exam in dr    Sildenafil Other (See Comments)   Other reaction(s): Headache, Dizziness   Topiramate Other (See Comments)   Chest spasms and numbness   Zofran [ondansetron]    Amlodipine  Other (See Comments)   LE edema   Levitra [vardenafil] Other (See Comments)   headaches   Metformin Nausea And Vomiting   Tadalafil Other (See Comments)   dizziness        Medication List     TAKE these medications    Accu-Chek Aviva Plus test strip Generic drug: glucose blood Use as directed to check blood sugar 3 times daily (uad tid)   aspirin EC 81 MG tablet Take 1 tablet (81 mg total) by mouth daily. Swallow whole. What changed: Another medication with the same name was removed. Continue taking this medication, and follow the directions you see here.   atorvastatin 40 MG tablet Commonly known as: LIPITOR Take 1 tablet (40 mg total) by mouth daily.   Blood Pressure Cuff Misc 1 Units by Does not apply route daily.   calcium carbonate 500 MG chewable tablet Commonly known as: TUMS - dosed in mg elemental calcium Chew 1 tablet by mouth daily as needed for indigestion or heartburn.   carvedilol 25 MG tablet Commonly known as: COREG Take 1 tablet (25 mg total) by mouth 2 (two) times daily.   chlorhexidine 0.12 % solution Commonly known as: PERIDEX Use as directed 15 mLs in the mouth or throat 2 (two) times daily.   chlorthalidone 25 MG tablet Commonly known as: HYGROTON  TAKE 1 TABLET EVERY DAY   clopidogrel 75 MG tablet Commonly known as: PLAVIX TAKE 1 TABLET EVERY DAY. NEED APPOINTMENT FOR FURTHER REFILLS   FreeStyle Libre 2 Sensor Misc 1 Device by Does not apply route every 14 (fourteen) days.   furosemide 40 MG tablet Commonly known as: LASIX TAKE 1 TABLET (40 MG TOTAL) BY MOUTH DAILY. NEED APPT.   insulin lispro 100 UNIT/ML KwikPen Commonly known as: HumaLOG KwikPen Inject 6-10 Units into the skin 2 (two) times daily before a meal.   Insulin Pen Needle 32G X 4 MM Misc Use 3x a day   Lantus SoloStar 100 UNIT/ML Solostar Pen Generic drug: insulin glargine Inject 14 Units into the skin daily.   losartan 100 MG tablet Commonly known as:  COZAAR Take 1 tablet (100 mg total) by mouth daily.   Microlet Lancets Misc UAD to check sugars TID.  E11.22   nitroGLYCERIN 0.4 MG SL tablet Commonly known as: Nitrostat Place 1 tablet (0.4 mg total) under the tongue every 5 (five) minutes as needed for chest pain (up to 3 doses).   pantoprazole 40 MG tablet Commonly known as: Protonix Take 1 tablet (40 mg total) by mouth daily.   polyethylene glycol 17 g packet Commonly known as: MiraLax Take 17 g by mouth daily.   senna-docusate 8.6-50 MG tablet Commonly known as: Senokot-S Take 2 tablets by mouth 2 (two) times daily. For constipation.   tiZANidine 2 MG tablet Commonly known as: ZANAFLEX Take 1 tablet (2 mg total) by mouth at bedtime as needed for muscle spasms.        Follow-up Information     Pincus Sanes, MD Follow up in 1 week(s).   Specialty: Internal Medicine Contact information: [address] [place] [address] [phone]                Allergies  Allergen Reactions   Penicillins Anaphylaxis   Pneumococcal Vaccines Anaphylaxis   Shellfish Allergy Anaphylaxis   Barbiturates    Gabapentin     Urinary retention   Influenza Vaccines    Lisinopril Cough   Other Nausea And Vomiting    Pt wife states that dye that is used for eye exam in dr    Sildenafil Other (See Comments)    Other reaction(s): Headache, Dizziness   Topiramate Other (See Comments)    Chest spasms and numbness   Zofran [Ondansetron]    Amlodipine Other (See Comments)    LE edema   Levitra [Vardenafil] Other (See Comments)    headaches   Metformin Nausea And Vomiting   Tadalafil Other (See Comments)    dizziness    The results of significant diagnostics from this hospitalization (including imaging, microbiology, ancillary and laboratory) are listed below for reference.    Microbiology: Recent Results (from the past 240 hour(s))  SARS Coronavirus 2 by RT PCR (hospital order, performed in [facility] hospital  lab) *cepheid single result test* Anterior Nasal Swab     Status: None   Collection Time: 07/14/23 12:55 PM   Specimen: Anterior Nasal Swab  Result Value Ref Range Status   SARS Coronavirus 2 by RT PCR NEGATIVE NEGATIVE Final    Comment: Performed at [facility], [address], [place], [place] [address]    Procedures/Studies: US Abdomen Limited RUQ (LIVER/GB)  Result Date: 07/14/2023 CLINICAL DATA:  Abdominal pain. EXAM: ULTRASOUND ABDOMEN LIMITED RIGHT UPPER QUADRANT COMPARISON:  None Available. FINDINGS: Gallbladder: Lobulated, nonshadowing echogenic material is seen within  the dependent portion of a moderately distended gallbladder lumen. The largest measures approximately 1.6 cm. This area demonstrates no flow on color Doppler evaluation. There is no evidence of gallbladder wall thickening (2.4 mm). No sonographic Murphy sign noted by sonographer. Common bile duct: Diameter: 4.8 mm Liver: No focal lesion identified. Within normal limits in parenchymal echogenicity. Portal vein is patent on color Doppler imaging with normal direction of blood flow towards the liver. Other: None. IMPRESSION: Findings likely consistent with cholelithiasis and/or tumefactive sludge within the gallbladder lumen, without evidence of acute cholecystitis. Correlation with 6-12 week follow-up right upper quadrant ultrasound is recommended to determine stability. Electronically Signed   By: Aram Candela M.D.   On: 07/14/2023 18:56   CT ABDOMEN PELVIS W CONTRAST  Result Date: 07/14/2023 CLINICAL DATA:  Acute generalized abdominal pain. EXAM: CT ABDOMEN AND PELVIS WITH CONTRAST TECHNIQUE: Multidetector CT imaging of the abdomen and pelvis was performed using the standard protocol following bolus administration of intravenous contrast. RADIATION DOSE REDUCTION: This exam was performed according to the departmental dose-optimization program which includes automated exposure control, adjustment of the mA and/or kV  according to patient size and/or use of iterative reconstruction technique. CONTRAST:  65mL OMNIPAQUE IOHEXOL 350 MG/ML SOLN COMPARISON:  Apr 20, 2023. FINDINGS: Lower chest: No acute abnormality. Hepatobiliary: Minimal cholelithiasis. No biliary dilatation. Liver is unremarkable. Pancreas: Stable 20 x 11 mm low density seen in uncinate process of pancreatic head. No acute abnormality is noted. Spleen: Normal in size without focal abnormality. Adrenals/Urinary Tract: Adrenal glands are unremarkable. Kidneys are normal, without renal calculi, focal lesion, or hydronephrosis. Bladder is unremarkable. Stomach/Bowel: Stomach is within normal limits. Appendix appears normal. No evidence of bowel wall thickening, distention, or inflammatory changes. Vascular/Lymphatic: No significant vascular findings are present. No enlarged abdominal or pelvic lymph nodes. Reproductive: Prostate is unremarkable. Other: Small fat containing left inguinal hernia. No ascites is noted. Musculoskeletal: No acute or significant osseous findings. IMPRESSION: Grossly stable 20 x 11 mm low density seen in uncinate process of pancreatic head. 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 Per consensus criteria, follow-up with preferably pre and post contrast abdominal MRI at 1 year is recommended. This recommendation follows ACR consensus guidelines: Managing Incidental Findings on Abdominal CT: White Paper of the ACR Incidental Findings Committee. J Am Coll Radiol 2010;7:754-773. Minimal cholelithiasis. Small fat containing left inguinal hernia. Electronically Signed   By: Lupita Raider M.D.   On: 07/14/2023 16:27   DG Chest 2 View  Result Date: 07/14/2023 CLINICAL DATA:  Chest pain and vomiting EXAM: CHEST - 2 VIEW COMPARISON:  X-ray 03/29/2022 FINDINGS: Borderline cardiopericardial silhouette. No consolidation, pneumothorax or effusion. No edema. Degenerative changes of the  spine. Overlapping cardiac leads. IMPRESSION: Borderline size heart.  No acute cardiopulmonary disease. Electronically Signed   By: Karen Kays M.D.   On: 07/14/2023 13:36    Labs: BNP (last 3 results) No results for input(s): "BNP" in the last 8760 hours. Basic Metabolic Panel: Recent Labs  Lab 07/14/23 1251 07/14/23 1945 07/15/23 0553 07/16/23 0419  NA 138  --  137 136  K 4.1  --  3.7 3.6  CL 102  --  103 101  CO2 24  --  28 26  GLUCOSE 216*  --  143* 175*  BUN 28*  --  25* 19  CREATININE 1.99*  --  2.19* 1.87*  CALCIUM 9.4  --  8.7* 8.6*  MG  --  1.5* 1.4*  --    Liver Function Tests: Recent Labs  Lab 07/14/23 1251 07/15/23 0553 07/16/23 0419  AST 134* 74* 40  ALT 89* 90* 58*  ALKPHOS 78 69 64  BILITOT 1.5* 1.3* 1.3*  PROT 7.4 6.6 6.5  ALBUMIN 3.6 3.1* 3.1*   Recent Labs  Lab 07/14/23 1251 07/15/23 0553  LIPASE 34 57*   No results for input(s): "AMMONIA" in the last 168 hours. CBC: Recent Labs  Lab 07/14/23 1251 07/15/23 0553 07/16/23 0419  WBC 8.4 10.2 9.9  NEUTROABS 6.3 6.8  --   HGB 10.6* 9.6* 9.3*  HCT 32.0* 28.4* 27.7*  MCV 94.7 91.3 92.6  PLT 151 148* 132*   Cardiac Enzymes: Recent Labs  Lab 07/14/23 1945  CKTOTAL 98   BNP: Invalid input(s): "POCBNP" CBG: Recent Labs  Lab 07/15/23 0754 07/15/23 1423 07/15/23 1556 07/15/23 2156 07/16/23 0801  GLUCAP 140* 193* 173* 165* 175*   D-Dimer No results for input(s): "DDIMER" in the last 72 hours. Hgb A1c Recent Labs    07/14/23 1251  HGBA1C 8.3*   Lipid Profile No results for input(s): "CHOL", "HDL", "LDLCALC", "TRIG", "CHOLHDL", "LDLDIRECT" in the last 72 hours. Thyroid function studies Recent Labs    07/14/23 1945  TSH 1.973   Anemia work up No results for input(s): "VITAMINB12", "FOLATE", "FERRITIN", "TIBC", "IRON", "RETICCTPCT" in the last 72 hours. Urinalysis    Component Value Date/Time   COLORURINE YELLOW 07/14/2023 1440   APPEARANCEUR CLEAR 07/14/2023 1440    LABSPEC 1.017 07/14/2023 1440   PHURINE 5.0 07/14/2023 1440   GLUCOSEU 50 (A) 07/14/2023 1440   HGBUR NEGATIVE 07/14/2023 1440   BILIRUBINUR NEGATIVE 07/14/2023 1440   BILIRUBINUR negative 11/06/2021 0918   KETONESUR NEGATIVE 07/14/2023 1440   PROTEINUR 30 (A) 07/14/2023 1440   UROBILINOGEN 0.2 11/06/2021 0918   UROBILINOGEN 0.2 04/07/2012 1648   NITRITE NEGATIVE 07/14/2023 1440   LEUKOCYTESUR NEGATIVE 07/14/2023 1440   Sepsis Labs Recent Labs  Lab 07/14/23 1251 07/15/23 0553 07/16/23 0419  WBC 8.4 10.2 9.9   Microbiology Recent Results (from the past 240 hour(s))  SARS Coronavirus 2 by RT PCR (hospital order, performed in [facility] hospital lab) *cepheid single result test* Anterior Nasal Swab     Status: None   Collection Time: 07/14/23 12:55 PM   Specimen: Anterior Nasal Swab  Result Value Ref Range Status   SARS Coronavirus 2 by RT PCR NEGATIVE NEGATIVE Final    Comment: Performed at [facility], [address] [place], [place] [address]     Time coordinating discharge: 25 minutes  SIGNED: Lanae Boast, MD  [facility] 07/16/2023, 8:58 AM  If 7PM-7AM, please contact night-coverage [email or web address]

## 2023-07-18 ENCOUNTER — Encounter: Payer: Self-pay | Admitting: Internal Medicine

## 2023-07-18 ENCOUNTER — Telehealth: Payer: Self-pay

## 2023-07-18 NOTE — Patient Instructions (Addendum)
      Blood work was ordered.   The lab is on the first floor.    Medications changes include :   none    A referral was ordered and someone will call you to schedule an appointment.     Return in about 6 months (around 01/19/2024) for Physical Exam.

## 2023-07-18 NOTE — Progress Notes (Unsigned)
Subjective:    Patient ID: [name], male    DOB: [birth year], 72 y.o.   MRN: [id number]     HPI [name] is here for follow up from the hospital   Admitted 07/14/23  - 07/16/23   Presented with intractable N/V x 1 day.  Vomit was nonbloody, nonbilious.  He had associated mild sharp epigastric discomfrt with radiation up to chest .  No diarrhea.  No fever/chills, myalgias.  Started a few hrs after eating chicken from [employer] 8/21 - daughter had similar symptoms.   Labs and imaging reviewed.  EKG unchanged.    Intractable N/V, epigastric pain, GERD, cholelithiasis: [place] abd, CT ab/pelvis -stable chronic findings LFTs downtrending Continue PPT   Chest pain, CAD w/ previous stents: Did have some chest discomfort in setting of N/V Cardio consulted - troponin negative x 2 Continue ASA, Coreg, plavix, losartan No further inpatient evaluation per cardio  Constipation: Stool softener added Continue miralax daily  DM:  A1c 8.3% CKD stage 3b: stable Hypomagnesemia: repleted anemia: stable HFpEF:  euvolemic HTN: stable, coreg, losartan, chlorthalidone, lasix held - resumed at d/c HLD: held statin due to lfts - resumed at d/c  Medications and allergies reviewed with patient and updated if appropriate.  Current Outpatient Medications on File Prior to Visit  Medication Sig Dispense Refill   aspirin EC 81 MG tablet Take 1 tablet (81 mg total) by mouth daily. Swallow whole. 30 tablet 11   atorvastatin (LIPITOR) 40 MG tablet Take 1 tablet (40 mg total) by mouth daily. 90 tablet 3   Blood Pressure Monitoring (BLOOD PRESSURE CUFF) MISC 1 Units by Does not apply route daily. 1 each 0   calcium carbonate (TUMS - DOSED IN MG ELEMENTAL CALCIUM) 500 MG chewable tablet Chew 1 tablet by mouth daily as needed for indigestion or heartburn.     carvedilol (COREG) 25 MG tablet Take 1 tablet (25 mg total) by mouth 2 (two) times daily. 180 tablet 3   chlorhexidine (PERIDEX) 0.12  % solution Use as directed 15 mLs in the mouth or throat 2 (two) times daily. (Patient not taking: Reported on 07/14/2023)     chlorthalidone (HYGROTON) 25 MG tablet TAKE 1 TABLET EVERY DAY 90 tablet 3   clopidogrel (PLAVIX) 75 MG tablet TAKE 1 TABLET EVERY DAY. NEED APPOINTMENT FOR FURTHER REFILLS 90 tablet 3   Continuous Blood Gluc Sensor (FREESTYLE LIBRE 2 SENSOR) MISC 1 Device by Does not apply route every 14 (fourteen) days. 6 each 3   furosemide (LASIX) 40 MG tablet TAKE 1 TABLET (40 MG TOTAL) BY MOUTH DAILY. NEED APPT. 90 tablet 3   glucose blood (ACCU-CHEK AVIVA PLUS) test strip uad tid 100 each 12   insulin glargine (LANTUS SOLOSTAR) 100 UNIT/ML Solostar Pen Inject 14 Units into the skin daily. 15 mL 3   insulin lispro (HUMALOG KWIKPEN) 100 UNIT/ML KwikPen Inject 6-10 Units into the skin 2 (two) times daily before a meal. 15 mL 3   Insulin Pen Needle 32G X 4 MM MISC Use 3x a day 200 each 3   losartan (COZAAR) 100 MG tablet Take 1 tablet (100 mg total) by mouth daily. 90 tablet 3   Microlet Lancets MISC UAD to check sugars TID.  E11.22 270 each 3   nitroGLYCERIN (NITROSTAT) 0.4 MG SL tablet Place 1 tablet (0.4 mg total) under the tongue every 5 (five) minutes as needed for chest pain (up to 3 doses). 25 tablet 3   pantoprazole (PROTONIX) 40  MG tablet Take 1 tablet (40 mg total) by mouth daily. 30 tablet 0   polyethylene glycol (MIRALAX) 17 g packet Take 17 g by mouth daily. 14 each 0   senna-docusate (SENOKOT-S) 8.6-50 MG per tablet Take 2 tablets by mouth 2 (two) times daily. For constipation.     tiZANidine (ZANAFLEX) 2 MG tablet Take 1 tablet (2 mg total) by mouth at bedtime as needed for muscle spasms. 30 tablet 0   Current Facility-Administered Medications on File Prior to Visit  Medication Dose Route Frequency Provider Last Rate Last Admin   sodium chloride flush (NS) 0.9 % injection 3 mL  3 mL Intravenous Q12H Chilton Si, MD         Review of Systems     Objective:   There were no vitals filed for this visit. BP Readings from Last 3 Encounters:  07/16/23 126/64  06/27/23 (!) 144/80  04/26/23 114/76   Wt Readings from Last 3 Encounters:  07/14/23 210 lb (95.3 kg)  06/27/23 214 lb 11.2 oz (97.4 kg)  04/26/23 211 lb (95.7 kg)   There is no height or weight on file to calculate BMI.    Physical Exam     Lab Results  Component Value Date   WBC 9.9 07/16/2023   HGB 9.3 (L) 07/16/2023   HCT 27.7 (L) 07/16/2023   PLT 132 (L) 07/16/2023   GLUCOSE 175 (H) 07/16/2023   CHOL 116 12/24/2022   TRIG 95 12/24/2022   HDL 60 12/24/2022   LDLDIRECT 63.0 01/12/2016   LDLCALC 38 12/24/2022   ALT 58 (H) 07/16/2023   AST 40 07/16/2023   NA 136 07/16/2023   K 3.6 07/16/2023   CL 101 07/16/2023   CREATININE 1.87 (H) 07/16/2023   BUN 19 07/16/2023   CO2 26 07/16/2023   TSH 1.973 07/14/2023   PSA 0.14 08/17/2016   INR 1.2 07/15/2023   HGBA1C 8.3 (H) 07/14/2023   MICROALBUR 7.5 (H) 08/17/2016   US Abdomen Limited RUQ (LIVER/GB) CLINICAL DATA:  Abdominal pain.  EXAM: ULTRASOUND ABDOMEN LIMITED RIGHT UPPER QUADRANT  COMPARISON:  None Available.  FINDINGS: Gallbladder:  Lobulated, nonshadowing echogenic material is seen within the dependent portion of a moderately distended gallbladder lumen. The largest measures approximately 1.6 cm. This area demonstrates no flow on color Doppler evaluation. There is no evidence of gallbladder wall thickening (2.4 mm). No sonographic Murphy sign noted by sonographer.  Common bile duct:  Diameter: 4.8 mm  Liver:  No focal lesion identified. Within normal limits in parenchymal echogenicity. Portal vein is patent on color Doppler imaging with normal direction of blood flow towards the liver.  Other: None.  IMPRESSION: Findings likely consistent with cholelithiasis and/or tumefactive sludge within the gallbladder lumen, without evidence of acute cholecystitis. Correlation with 6-12 week follow-up  right upper quadrant ultrasound is recommended to determine stability.  Electronically Signed   By: Aram Candela M.D.   On: 07/14/2023 18:56 CT ABDOMEN PELVIS W CONTRAST CLINICAL DATA:  Acute generalized abdominal pain.  EXAM: CT ABDOMEN AND PELVIS WITH CONTRAST  TECHNIQUE: Multidetector CT imaging of the abdomen and pelvis was performed using the standard protocol following bolus administration of intravenous contrast.  RADIATION DOSE REDUCTION: This exam was performed according to the departmental dose-optimization program which includes automated exposure control, adjustment of the mA and/or kV according to patient size and/or use of iterative reconstruction technique.  CONTRAST:  65mL OMNIPAQUE IOHEXOL 350 MG/ML SOLN  COMPARISON:  Apr 20, 2023.  FINDINGS: Lower chest:  No acute abnormality.  Hepatobiliary: Minimal cholelithiasis. No biliary dilatation. Liver is unremarkable.  Pancreas: Stable 20 x 11 mm low density seen in uncinate process of pancreatic head. No acute abnormality is noted.  Spleen: Normal in size without focal abnormality.  Adrenals/Urinary Tract: Adrenal glands are unremarkable. Kidneys are normal, without renal calculi, focal lesion, or hydronephrosis. Bladder is unremarkable.  Stomach/Bowel: Stomach is within normal limits. Appendix appears normal. No evidence of bowel wall thickening, distention, or inflammatory changes.  Vascular/Lymphatic: No significant vascular findings are present. No enlarged abdominal or pelvic lymph nodes.  Reproductive: Prostate is unremarkable.  Other: Small fat containing left inguinal hernia. No ascites is noted.  Musculoskeletal: No acute or significant osseous findings.  IMPRESSION: Grossly stable 20 x 11 mm low density seen in uncinate process of pancreatic head. 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 Per consensus criteria,  follow-up with preferably pre and post contrast abdominal MRI at 1 year is recommended. This recommendation follows ACR consensus guidelines: Managing Incidental Findings on Abdominal CT: White Paper of the ACR Incidental Findings Committee. J Am Coll Radiol 2010;7:754-773.  Minimal cholelithiasis.  Small fat containing left inguinal hernia.  Electronically Signed   By: Lupita Raider M.D.   On: 07/14/2023 16:27 DG Chest 2 View CLINICAL DATA:  Chest pain and vomiting  EXAM: CHEST - 2 VIEW  COMPARISON:  X-ray 03/29/2022  FINDINGS: Borderline cardiopericardial silhouette. No consolidation, pneumothorax or effusion. No edema. Degenerative changes of the spine. Overlapping cardiac leads.  IMPRESSION: Borderline size heart.  No acute cardiopulmonary disease.  Electronically Signed   By: Karen Kays M.D.   On: 07/14/2023 13:36    Assessment & Plan:    See Problem List for Assessment and Plan of chronic medical problems.

## 2023-07-18 NOTE — Transitions of Care (Post Inpatient/ED Visit) (Signed)
07/18/2023  Name: [name] MRN: [id number] DOB: 1951-09-05  Today's TOC FU Call Status: Today's TOC FU Call Status:: Successful TOC FU Call Completed TOC FU Call Complete Date: 07/18/23 Patient's Name and Date of Birth confirmed.  Transition Care Management Follow-up Telephone Call Date of Discharge: 07/16/23 Discharge Facility: [facility]) Type of Discharge: Inpatient Admission Primary Inpatient Discharge Diagnosis:: Intractable nausea and vomiting How have you been since you were released from the hospital?: Better Any questions or concerns?: No  Items Reviewed: Did you receive and understand the discharge instructions provided?: Yes Medications obtained,verified, and reconciled?: Yes (Medications Reviewed) Any new allergies since your discharge?: No Dietary orders reviewed?: Yes Do you have support at home?: Yes  Medications Reviewed Today: Medications Reviewed Today     Reviewed by Merleen Nicely, LPN (Licensed Practical Nurse) on 07/18/23 at 1037  Med List Status: <None>   Medication Order Taking? Sig Documenting Provider Last Dose Status Informant  aspirin EC 81 MG tablet [id number] Yes Take 1 tablet (81 mg total) by mouth daily. Swallow whole. Laurann Montana, PA-C Taking Active Self, Spouse/Significant Other, Child  atorvastatin (LIPITOR) 40 MG tablet [id number] Yes Take 1 tablet (40 mg total) by mouth daily. Alver Sorrow, NP Taking Active Self, Spouse/Significant Other, Child  Blood Pressure Monitoring (BLOOD PRESSURE CUFF) MISC [id number] Yes 1 Units by Does not apply route daily. Chilton Si, MD Taking Active Self, Spouse/Significant Other, Child  calcium carbonate (TUMS - DOSED IN MG ELEMENTAL CALCIUM) 500 MG chewable tablet [id number] Yes Chew 1 tablet by mouth daily as needed for indigestion or heartburn. [provider] Taking Active Self, Spouse/Significant Other, Child  carvedilol (COREG) 25 MG tablet [id number] Yes Take 1 tablet (25  mg total) by mouth 2 (two) times daily. Alver Sorrow, NP Taking Active Self, Spouse/Significant Other, Child  chlorhexidine (PERIDEX) 0.12 % solution [id number] No Use as directed 15 mLs in the mouth or throat 2 (two) times daily.  Patient not taking: Reported on 07/14/2023   [provider] Not Taking Active Self, Spouse/Significant Other, Child  chlorthalidone (HYGROTON) 25 MG tablet [id number] Yes TAKE 1 TABLET EVERY DAY Chilton Si, MD Taking Active Self, Spouse/Significant Other, Child  clopidogrel (PLAVIX) 75 MG tablet [id number] Yes TAKE 1 TABLET EVERY DAY. NEED APPOINTMENT FOR FURTHER REFILLS Chilton Si, MD Taking Active Self, Spouse/Significant Other, Child  Continuous Blood Gluc Sensor (FREESTYLE LIBRE 2 SENSOR) MISC [id number] Yes 1 Device by Does not apply route every 14 (fourteen) days. Romero Belling, MD Taking Active Self, Spouse/Significant Other, Child  furosemide (LASIX) 40 MG tablet [id number] Yes TAKE 1 TABLET (40 MG TOTAL) BY MOUTH DAILY. NEED APPT. Chilton Si, MD Taking Active Self, Spouse/Significant Other, Child           Med Note Merlene Morse, Kathreen Cosier   Tue Dec 08, 2021  1:12 PM) prn  glucose blood (ACCU-CHEK AVIVA PLUS) test strip [id number] Yes uad tid Pincus Sanes, MD Taking Active Self, Spouse/Significant Other, Child  insulin glargine (LANTUS SOLOSTAR) 100 UNIT/ML Solostar Pen [id number] Yes Inject 14 Units into the skin daily. Carlus Pavlov, MD Taking Active Self, Spouse/Significant Other, Child  insulin lispro (HUMALOG KWIKPEN) 100 UNIT/ML KwikPen [id number] Yes Inject 6-10 Units into the skin 2 (two) times daily before a meal. Carlus Pavlov, MD Taking Active Self, Spouse/Significant Other, Child  Insulin Pen Needle 32G X 4 MM MISC [id number] Yes Use 3x a day Carlus Pavlov, MD Taking Active Self, Spouse/Significant Other, Child  losartan (COZAAR) 100 MG  tablet [id number] Yes Take 1 tablet (100 mg total) by mouth daily. Alver Sorrow, NP Taking Active Self, Spouse/Significant Other, Child  Microlet Lancets MISC [id number] Yes UAD to check sugars TID.  E11.22 Pincus Sanes, MD Taking Active Self, Spouse/Significant Other, Child  nitroGLYCERIN (NITROSTAT) 0.4 MG SL tablet [id number] Yes Place 1 tablet (0.4 mg total) under the tongue every 5 (five) minutes as needed for chest pain (up to 3 doses). Chilton Si, MD Taking Active Self, Spouse/Significant Other, Child  pantoprazole (PROTONIX) 40 MG tablet [id number] Yes Take 1 tablet (40 mg total) by mouth daily. Lanae Boast, MD Taking Active   polyethylene glycol (MIRALAX) 17 g packet [id number] Yes Take 17 g by mouth daily. Lanae Boast, MD Taking Active   senna-docusate (SENOKOT-S) 8.6-50 MG per tablet [id number] Yes Take 2 tablets by mouth 2 (two) times daily. For constipation. Jacquelynn Cree, PA-C Taking Active Self, Spouse/Significant Other, Child  sodium chloride flush (NS) 0.9 % injection 3 mL [id number]   Chilton Si, MD  Active   tiZANidine (ZANAFLEX) 2 MG tablet [id number] Yes Take 1 tablet (2 mg total) by mouth at bedtime as needed for muscle spasms. Pincus Sanes, MD Taking Active Self, Spouse/Significant Other, Child            Home Care and Equipment/Supplies: Were Home Health Services Ordered?: No Any new equipment or medical supplies ordered?: No  Functional Questionnaire: Do you need assistance with bathing/showering or dressing?: No Do you need assistance with meal preparation?: No Do you need assistance with eating?: No Do you have difficulty maintaining continence: No Do you need assistance with getting out of bed/getting out of a chair/moving?: No Do you have difficulty managing or taking your medications?: No  Follow up appointments reviewed: PCP Follow-up appointment confirmed?: Yes Date of PCP follow-up appointment?: 07/19/23 Follow-up Provider: Dr Lawerance Bach [facility] Follow-up appointment confirmed?: No Reason Specialist Follow-Up Not  Confirmed: Patient has Specialist Provider Number and will Call for Appointment Do you need transportation to your follow-up appointment?: No Do you understand care options if your condition(s) worsen?: Yes-patient verbalized understanding    SIGNATURE  Woodfin Ganja LPN [facility] Nurse Health Advisor Direct Dial [phone]

## 2023-07-19 ENCOUNTER — Ambulatory Visit (INDEPENDENT_AMBULATORY_CARE_PROVIDER_SITE_OTHER): Payer: Medicare HMO | Admitting: Internal Medicine

## 2023-07-19 VITALS — BP 132/80 | HR 64 | Temp 98.0°F | Ht 71.0 in | Wt 214.0 lb

## 2023-07-19 DIAGNOSIS — K8020 Calculus of gallbladder without cholecystitis without obstruction: Secondary | ICD-10-CM

## 2023-07-19 DIAGNOSIS — Z794 Long term (current) use of insulin: Secondary | ICD-10-CM

## 2023-07-19 DIAGNOSIS — I5032 Chronic diastolic (congestive) heart failure: Secondary | ICD-10-CM

## 2023-07-19 DIAGNOSIS — R7401 Elevation of levels of liver transaminase levels: Secondary | ICD-10-CM

## 2023-07-19 DIAGNOSIS — K8689 Other specified diseases of pancreas: Secondary | ICD-10-CM

## 2023-07-19 DIAGNOSIS — I10 Essential (primary) hypertension: Secondary | ICD-10-CM

## 2023-07-19 DIAGNOSIS — N1832 Chronic kidney disease, stage 3b: Secondary | ICD-10-CM

## 2023-07-19 DIAGNOSIS — E1122 Type 2 diabetes mellitus with diabetic chronic kidney disease: Secondary | ICD-10-CM

## 2023-07-19 DIAGNOSIS — E782 Mixed hyperlipidemia: Secondary | ICD-10-CM

## 2023-07-19 DIAGNOSIS — K5909 Other constipation: Secondary | ICD-10-CM

## 2023-07-19 DIAGNOSIS — K5900 Constipation, unspecified: Secondary | ICD-10-CM | POA: Insufficient documentation

## 2023-07-19 DIAGNOSIS — R112 Nausea with vomiting, unspecified: Secondary | ICD-10-CM

## 2023-07-19 LAB — CBC WITH DIFFERENTIAL/PLATELET
Basophils Absolute: 0 10*3/uL (ref 0.0–0.1)
Basophils Relative: 0.4 % (ref 0.0–3.0)
Eosinophils Absolute: 0.4 10*3/uL (ref 0.0–0.7)
Eosinophils Relative: 6.3 % — ABNORMAL HIGH (ref 0.0–5.0)
HCT: 32.9 % — ABNORMAL LOW (ref 39.0–52.0)
Hemoglobin: 10.9 g/dL — ABNORMAL LOW (ref 13.0–17.0)
Lymphocytes Relative: 30.5 % (ref 12.0–46.0)
Lymphs Abs: 2.2 10*3/uL (ref 0.7–4.0)
MCHC: 33 g/dL (ref 30.0–36.0)
MCV: 93.9 fl (ref 78.0–100.0)
Monocytes Absolute: 0.9 10*3/uL (ref 0.1–1.0)
Monocytes Relative: 12.6 % — ABNORMAL HIGH (ref 3.0–12.0)
Neutro Abs: 3.6 10*3/uL (ref 1.4–7.7)
Neutrophils Relative %: 50.2 % (ref 43.0–77.0)
Platelets: 191 10*3/uL (ref 150.0–400.0)
RBC: 3.5 Mil/uL — ABNORMAL LOW (ref 4.22–5.81)
RDW: 12.9 % (ref 11.5–15.5)
WBC: 7.1 10*3/uL (ref 4.0–10.5)

## 2023-07-19 LAB — COMPREHENSIVE METABOLIC PANEL
ALT: 31 U/L (ref 0–53)
AST: 23 U/L (ref 0–37)
Albumin: 3.9 g/dL (ref 3.5–5.2)
Alkaline Phosphatase: 72 U/L (ref 39–117)
BUN: 25 mg/dL — ABNORMAL HIGH (ref 6–23)
CO2: 30 mEq/L (ref 19–32)
Calcium: 9.5 mg/dL (ref 8.4–10.5)
Chloride: 100 mEq/L (ref 96–112)
Creatinine, Ser: 2.21 mg/dL — ABNORMAL HIGH (ref 0.40–1.50)
GFR: 28.79 mL/min — ABNORMAL LOW (ref >60.00)
Glucose, Bld: 215 mg/dL — ABNORMAL HIGH (ref 70–99)
Potassium: 4.2 mEq/L (ref 3.5–5.1)
Sodium: 137 mEq/L (ref 135–145)
Total Bilirubin: 0.9 mg/dL (ref 0.2–1.2)
Total Protein: 7.9 g/dL (ref 6.0–8.3)

## 2023-07-19 MED ORDER — BISACODYL 10 MG RE SUPP: 10.0000 mg | RECTAL | 0 refills | Status: DC | PRN

## 2023-07-19 NOTE — Assessment & Plan Note (Signed)
Chronic Euvolemic on exam Following with cardiology On Coreg 25 mg twice daily, chlorthalidone 25 mg daily, furosemide 40 mg daily

## 2023-07-19 NOTE — Assessment & Plan Note (Addendum)
Recent hospitalization for intractable nausea, vomiting, epigastric pain LFTs were elevated, but returned to normal Abdominal ultrasound showed cholelithiasis and gallbladder sludge-follow-up ultrasound and referral to surgery ordered CT abdomen pelvis with stable findings-No acute process CMP today

## 2023-07-19 NOTE — Assessment & Plan Note (Signed)
Chronic Lab Results  Component Value Date   LDLCALC 38 12/24/2022    Continue atorvastatin 40 mg daily

## 2023-07-19 NOTE — Assessment & Plan Note (Signed)
Recent hospitalization for intractable nausea, vomiting, epigastric pain, GERD Continue pantoprazole 40 mg daily BMP, CBC today Symptoms have resolved

## 2023-07-19 NOTE — Assessment & Plan Note (Signed)
Chronic Blood pressure controlled Continue losartan 100 mg daily, chlorthalidone 25 mg daily, Coreg 25 mg twice daily

## 2023-07-19 NOTE — Assessment & Plan Note (Signed)
CT 06/2023-grossly stable 20 x 11 mm low-density use in that process of pancreatic head-likely pseudocyst, indolent neoplasm could look similar.  Repeat MRI pre and postcontrast in 1 year Discussed with patient

## 2023-07-19 NOTE — Assessment & Plan Note (Signed)
Ultrasound of gallbladder in hospital showed cholelithiasis and gallbladder sludge Follow-up ultrasound advised after 6-12 weeks Surgery consult advised No current symptoms concerning for cholecystitis Ultrasound ordered to be done in approximately 8 weeks Referral to surgery ordered-advised patient to discuss with them if surgery was necessary or not

## 2023-07-19 NOTE — Assessment & Plan Note (Addendum)
Chronic Following with endocrine-Dr. Elvera Lennox Currently on Lantus 14 units daily Stressed diabetic diet, regular exercise  Lab Results  Component Value Date   HGBA1C 8.3 (H) 07/14/2023

## 2023-07-19 NOTE — Assessment & Plan Note (Addendum)
Acute on chronic States he does have some chronic mild constipation that is usually controlled Since discharge she has had constipation-has not had a bowel movement in 3 days Since being home he has been eating normally and feels he is drinking enough fluids Taking Senokot 2 tabs twice daily Taking MiraLAX daily Discussed that he can increase MiraLAX to twice daily for couple of days to see if that helps produces a bowel movement If no bowel movement I have sent Dulcolax suppositories to the pharmacy and he can take that Continue increased fluids Encouraged regular exercise which will also help keep his bowels normal Call if no improvement/resolution

## 2023-07-19 NOTE — Assessment & Plan Note (Signed)
Chronic Following with nephrology Stable BMP today

## 2023-07-27 ENCOUNTER — Ambulatory Visit
Admission: RE | Admit: 2023-07-27 | Discharge: 2023-07-27 | Disposition: A | Discharge status: Home / Self Care | Payer: Medicare HMO | Source: Ambulatory Visit | Attending: Internal Medicine | Admitting: Internal Medicine

## 2023-07-27 DIAGNOSIS — K8020 Calculus of gallbladder without cholecystitis without obstruction: Secondary | ICD-10-CM

## 2023-08-09 ENCOUNTER — Encounter: Payer: Self-pay | Admitting: Internal Medicine

## 2023-08-09 ENCOUNTER — Ambulatory Visit (INDEPENDENT_AMBULATORY_CARE_PROVIDER_SITE_OTHER): Payer: Medicare HMO | Admitting: Internal Medicine

## 2023-08-09 VITALS — BP 118/60 | HR 68 | Ht 71.0 in | Wt 217.0 lb

## 2023-08-09 DIAGNOSIS — E7849 Other hyperlipidemia: Secondary | ICD-10-CM | POA: Diagnosis not present

## 2023-08-09 DIAGNOSIS — E1159 Type 2 diabetes mellitus with other circulatory complications: Secondary | ICD-10-CM | POA: Diagnosis not present

## 2023-08-09 DIAGNOSIS — E1165 Type 2 diabetes mellitus with hyperglycemia: Secondary | ICD-10-CM

## 2023-08-09 DIAGNOSIS — Z794 Long term (current) use of insulin: Secondary | ICD-10-CM | POA: Diagnosis not present

## 2023-08-09 MED ORDER — FREESTYLE LIBRE 2 SENSOR MISC: 1.0000 | 3 refills | Status: DC

## 2023-08-09 NOTE — Progress Notes (Signed)
Patient ID: [name], male   DOB: 04-17-1951, 72 y.o.   MRN: [id number]  HPI: [name] is a 72 y.o.-year-old male, returning for follow-up for DM2, dx in 2007, insulin-dependent since 2011, uncontrolled, with complications (CAD, stroke, CKD, DR, ED, PN). Pt. previously saw Dr. Everardo All, last visit with me 4 months ago.  Interim history: No increased urination, nausea, chest pain.  He continues to have blurry vision. He was admitted with nausea/vomiting, CP - in 06/2023.  At that time, he was found to have cholelithiasis.  He had a slightly high lipase of 57 (11-51).  Patient has resolved.  Reviewed HbA1c: Lab Results  Component Value Date   HGBA1C 8.3 (H) 07/14/2023   HGBA1C 8.3 (A) 04/05/2023   HGBA1C 8.4 (A) 12/21/2022   HGBA1C 8.0 (A) 08/17/2022   HGBA1C 7.4 (A) 04/16/2022   HGBA1C 6.9 (A) 01/04/2022   HGBA1C 6.8 (A) 11/03/2021   HGBA1C 6.6 (A) 08/18/2021   HGBA1C 6.7 (A) 05/11/2021   HGBA1C 6.6 (A) 02/05/2021  04/22/2022: HbA1c 7.5%  Previously on: - Humalog 50/50 (prev. From PAP, now covered by the new insurance w/o a PA) 22 units after brunch >> moved to 15 minutes before brunch -  -stopped  in 12/2021 because feeling "weird, sweaty" He has significant tremors and cannot draw insulin from vials.  At last visit he was on: - Humalog 50/50 24 >> 18-20 >> 22 units 15 min before brunch He tried  [place] 10 mg before b'fast >> too $$$, did not qualify for PAP  We changed to: - Lantus 14 units at bedtime - Humalog 6-10 units 15 min before brunch and dinner  Pt checks his sugars >4x a day and they are:  Previously:  Previously:   Lowest sugar was 58 >> 67>> 53; he has hypoglycemia awareness at 70.  Highest sugar was 290 >> 300s >> 300s.  Glucometer: Accu-Chek  She usually has 2 meals: Brunch (11-am -2 pm) and dinner (6-8 pm).  - + CKD-sees nephrology, last BUN/creatinine:  Lab Results  Component Value Date   BUN 25 (H) 07/19/2023   BUN 19  07/16/2023   CREATININE 2.21 (H) 07/19/2023   CREATININE 1.87 (H) 07/16/2023   Lab Results  Component Value Date   MICRALBCREAT 85 02/26/2023   MICRALBCREAT 3.8 08/17/2016   MICRALBCREAT 3.2 08/07/2015   MICRALBCREAT 6.0 09/09/2014   MICRALBCREAT 2.4 01/30/2013  04/22/2022: ACR 33.7 On Cozaar 100 mg daily.  - + HL; last set of lipids: Lab Results  Component Value Date   CHOL 116 12/24/2022   HDL 60 12/24/2022   LDLCALC 38 12/24/2022   LDLDIRECT 63.0 01/12/2016   TRIG 95 12/24/2022   CHOLHDL 1.9 12/24/2022  On Lipitor 40 mg daily.  - last eye exam was in 11/2022 (Dr. Ashley Royalty): + DR reportedly. + Cataracts.   On ASA 81.  - no numbness and tingling in his feet.  Last foot exam 05/04/2023.  [facility] 01/25/2023: TSH 2.61 04/22/2022: TSH 2.46  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   Chronic kidney disease    Constipation    takes Carafate four times day   DIAB W/UNSPEC COMP TYPE II/UNSPEC TYPE UNCNTRL    ERECTILE  DYSFUNCTION    GERD    HYPERLIPIDEMIA-MIXED    HYPERTENSION, BENIGN    LBBB (left bundle branch block) 02/02/2016   MORTON'S NEUROMA, RIGHT    OSA on CPAP 09/09/2020   Peripheral neuropathy    Seasonal allergies    takes Allegra and Benadryl daily prn;uses Flonase daily   SHOULDER PAIN, RIGHT    Snoring 09/09/2020   Stroke, 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, s/p CIR    TIA on medication 02/2012   Unstable angina (HCC) 07/09/2020   Past Surgical History:  Procedure Laterality Date   ANGIOPLASTY     COLONOSCOPY     CORONARY ANGIOPLASTY  2005   2 stents   CORONARY STENT INTERVENTION N/A 07/10/2020   Procedure: CORONARY STENT INTERVENTION;  Surgeon: Swaziland, Peter M, MD;  [facility] INVASIVE CV LAB;  Service:  Cardiovascular;  Laterality: N/A;   DENTAL SURGERY     LARYNGOPLASTY  08/07/2012   Procedure: LARYNGOPLASTY;  Surgeon: Serena Colonel, MD;  Location: [facility] OR;  Service: ENT;  Laterality: Left;  Left Vocal Cord Medialyzation   LEFT HEART CATH AND CORONARY ANGIOGRAPHY N/A 07/10/2020   Procedure: LEFT HEART CATH AND CORONARY ANGIOGRAPHY;  Surgeon: Swaziland, Peter M, MD;  Location: [facility] INVASIVE CV LAB;  Service: Cardiovascular;  Laterality: N/A;   left knee surgury     x 2    stent  2001, 2004   coronary stents   Social History   Socioeconomic History   Marital status: Legally Separated    Spouse name: Not on file   Number of children: 3   Years of education: college   Highest education level: Not on file  Occupational History   Occupation: disabled    Employer: [employer]  Tobacco Use   Smoking status: Former    Current packs/day: 0.00    Average packs/day: 2.5 packs/day for 9.0 years (22.5 ttl pk-yrs)    Types: Cigarettes    Start date: 02/23/1974    Quit date: 02/24/1983    Years since quitting: 40.4   Smokeless tobacco: Never  Vaping Use   Vaping status: Never Used  Substance and Sexual Activity   Alcohol use: No    Alcohol/week: 0.0 standard drinks of alcohol   Drug use: No   Sexual activity: Never  Other Topics Concern   Not on file  Social History Narrative      Social Determinants of Health   Financial Resource Strain: Low Risk  (03/05/2022)   Overall Financial Resource Strain (CARDIA)    Difficulty of Paying Living Expenses: Not hard at all  Food Insecurity: No Food Insecurity (07/14/2023)   Hunger Vital Sign    Worried About Running Out of Food in the Last Year: Never true    Ran Out of Food in the Last Year: Never true  Transportation Needs: No Transportation Needs (07/14/2023)   PRAPARE - Administrator, Civil Service (Medical): No    Lack of Transportation (Non-Medical): No  Physical Activity: Sufficiently Active (03/05/2022)   Exercise Vital Sign    Days  of Exercise per Week: 3 days    Minutes of Exercise per Session: 60 min  Stress: No Stress Concern Present (03/05/2022)   [employer] of Occupational Health - Occupational Stress Questionnaire    Feeling of Stress : Not at all  Social Connections: Socially Isolated (03/05/2022)   Social Connection and Isolation Panel [NHANES]    Frequency of Communication with Friends and Family: Three times a  week    Frequency of Social Gatherings with Friends and Family: Three times a week    Attends Religious Services: Never    Active Member of Clubs or Organizations: No    Attends Banker Meetings: Never    Marital Status: Separated  Intimate Partner Violence: Not At Risk (07/14/2023)   Humiliation, Afraid, Rape, and Kick questionnaire    Fear of Current or Ex-Partner: No    Emotionally Abused: No    Physically Abused: No    Sexually Abused: No   Current Outpatient Medications on File Prior to Visit  Medication Sig Dispense Refill   aspirin EC 81 MG tablet Take 1 tablet (81 mg total) by mouth daily. Swallow whole. 30 tablet 11   atorvastatin (LIPITOR) 40 MG tablet Take 1 tablet (40 mg total) by mouth daily. 90 tablet 3   bisacodyl (DULCOLAX) 10 MG suppository Place 1 suppository (10 mg total) rectally as needed for severe constipation. 5 suppository 0   Blood Pressure Monitoring (BLOOD PRESSURE CUFF) MISC 1 Units by Does not apply route daily. 1 each 0   calcium carbonate (TUMS - DOSED IN MG ELEMENTAL CALCIUM) 500 MG chewable tablet Chew 1 tablet by mouth daily as needed for indigestion or heartburn.     carvedilol (COREG) 25 MG tablet Take 1 tablet (25 mg total) by mouth 2 (two) times daily. 180 tablet 3   chlorthalidone (HYGROTON) 25 MG tablet TAKE 1 TABLET EVERY DAY 90 tablet 3   clopidogrel (PLAVIX) 75 MG tablet TAKE 1 TABLET EVERY DAY. NEED APPOINTMENT FOR FURTHER REFILLS 90 tablet 3   Continuous Blood Gluc Sensor (FREESTYLE LIBRE 2 SENSOR) MISC 1 Device by Does not apply route  every 14 (fourteen) days. 6 each 3   furosemide (LASIX) 40 MG tablet TAKE 1 TABLET (40 MG TOTAL) BY MOUTH DAILY. NEED APPT. 90 tablet 3   glucose blood (ACCU-CHEK AVIVA PLUS) test strip uad tid 100 each 12   insulin glargine (LANTUS SOLOSTAR) 100 UNIT/ML Solostar Pen Inject 14 Units into the skin daily. 15 mL 3   insulin lispro (HUMALOG KWIKPEN) 100 UNIT/ML KwikPen Inject 6-10 Units into the skin 2 (two) times daily before a meal. 15 mL 3   Insulin Pen Needle 32G X 4 MM MISC Use 3x a day 200 each 3   losartan (COZAAR) 100 MG tablet Take 1 tablet (100 mg total) by mouth daily. 90 tablet 3   Microlet Lancets MISC UAD to check sugars TID.  E11.22 270 each 3   nitroGLYCERIN (NITROSTAT) 0.4 MG SL tablet Place 1 tablet (0.4 mg total) under the tongue every 5 (five) minutes as needed for chest pain (up to 3 doses). 25 tablet 3   pantoprazole (PROTONIX) 40 MG tablet Take 1 tablet (40 mg total) by mouth daily. 30 tablet 0   polyethylene glycol (MIRALAX) 17 g packet Take 17 g by mouth daily. 14 each 0   senna-docusate (SENOKOT-S) 8.6-50 MG per tablet Take 2 tablets by mouth 2 (two) times daily. For constipation.     tiZANidine (ZANAFLEX) 2 MG tablet Take 1 tablet (2 mg total) by mouth at bedtime as needed for muscle spasms. 30 tablet 0   No current facility-administered medications on file prior to visit.   Allergies  Allergen Reactions   Penicillins Anaphylaxis   Pneumococcal Vaccines Anaphylaxis   Shellfish Allergy Anaphylaxis   Barbiturates    Gabapentin     Urinary retention   Influenza Vaccines    Lisinopril Cough  Other Nausea And Vomiting    Pt wife states that dye that is used for eye exam in dr    Sildenafil Other (See Comments)    Other reaction(s): Headache, Dizziness   Topiramate Other (See Comments)    Chest spasms and numbness   Zofran [Ondansetron]    Amlodipine Other (See Comments)    LE edema   Levitra [Vardenafil] Other (See Comments)    headaches   Metformin Nausea And  Vomiting   Tadalafil Other (See Comments)    dizziness   Family History  Problem Relation Age of Onset   Cancer Mother    Liver cancer Brother    Colon cancer Neg Hx    Esophageal cancer Neg Hx    PE: BP 118/60   Pulse 68   Ht 5\' 11"  (1.803 m)   Wt 217 lb (98.4 kg)   SpO2 99%   BMI 30.27 kg/m  Wt Readings from Last 3 Encounters:  08/09/23 217 lb (98.4 kg)  07/19/23 214 lb (97.1 kg)  07/14/23 210 lb (95.3 kg)   Constitutional: overweight, in NAD, walks with a cane Eyes: EOMI, no exophthalmos ENT: no thyromegaly, no cervical lymphadenopathy Cardiovascular: RRR, No MRG Respiratory: CTA B Musculoskeletal: no deformities Skin: no rashes Neurological: + tremor with outstretched hands, + L sided paresis  ASSESSMENT: 1. DM2, insulin-dependent, uncontrolled, with complications - CAD - s/p BMS in 2001, and DES in 2005 - stroke - CKD - DR - ED - PN - Hypoglycemia-last in 2020  2. HL  PLAN:  1. Patient with long history of type 2 diabetes, insulin-dependent, on injectable antidiabetic regimen with premixed insulin (Humalog 50/50) at last visit, switched to a basal/bolus insulin regimen at that time.  He was not able to use an SGLT2 inhibitor due to price and he did not qualify for patient assistance. -At last visit, sugars were above target after dinner and they were decreasing slightly overnight, but not to goal.  They were increasing after brunch and then decrease, with the best blood sugars of the day being before dinner.  We switched from Humalog 50/50 to Lantus/Humalog and advised him how to vary the dose of rapid acting insulin depending on the size and consistency of his meals and also blood sugars.  At that time, HbA1c was better, at 8.3%.  He had another HbA1c obtained last month which was stable. CGM interpretation: -At today's visit, we reviewed his CGM downloads: It appears that 78% of values are in target range (goal >70%), while 21% are higher than 180 (goal <25%),  and 1% are lower than 70 (goal <4%).  The calculated average blood sugar is 148.  The projected HbA1c for the next 3 months (GMI) is 6.9%. -Reviewing the CGM trends, sugars appear to be improved, but still with hyperglycemia after lunch and snack at night.  Upon questioning, he is not injecting the Humalog insulin 15 minutes before meals, but at the start of the meal or after.  We discussed that this is conducive to postprandial hyperglycemia followed by hypoglycemia.  I advised him to take the insulin injections 15 minutes before meals unless the sugars are low, in which case, he can inject at the start of the meal.  Otherwise, I feel that we can continue the same doses of insulin.  I advised him to use 10 units of Humalog only with larger meals.  His sugars also increased after snack at night and I advised him to try to limit this or remove  the snack closer to dinner.  If he continues with this now, he will need to take a low-dose of Humalog before this. - I suggested to:  Patient Instructions  Please continue: - Lantus 14 units at bedtime - Humalog 6-10 units 15 min before brunch and dinner  Please return in 3-4 months.  - advised to check sugars at different times of the day - 4x a day, rotating check times - advised for yearly eye exams >> he is UTD - return to clinic in 3-4 months  2. HL -Reviewed his latest lipid panel from 12/2022: All fractions at goal -He continues Lipitor 40 mg daily without side effects  Carlus Pavlov, MD PhD [facility] Endocrinology

## 2023-08-09 NOTE — Patient Instructions (Addendum)
Please continue: - Lantus 14 units at bedtime - Humalog 6-10 units 15 min before brunch and dinner  Please return in 4 months.

## 2023-08-10 ENCOUNTER — Ambulatory Visit (INDEPENDENT_AMBULATORY_CARE_PROVIDER_SITE_OTHER): Payer: Medicare HMO | Admitting: Podiatry

## 2023-08-10 ENCOUNTER — Encounter: Payer: Self-pay | Admitting: Podiatry

## 2023-08-10 DIAGNOSIS — M792 Neuralgia and neuritis, unspecified: Secondary | ICD-10-CM

## 2023-08-10 DIAGNOSIS — B351 Tinea unguium: Secondary | ICD-10-CM | POA: Diagnosis not present

## 2023-08-10 DIAGNOSIS — M79675 Pain in left toe(s): Secondary | ICD-10-CM

## 2023-08-10 DIAGNOSIS — M79674 Pain in right toe(s): Secondary | ICD-10-CM | POA: Diagnosis not present

## 2023-08-10 DIAGNOSIS — E1142 Type 2 diabetes mellitus with diabetic polyneuropathy: Secondary | ICD-10-CM

## 2023-08-10 NOTE — Progress Notes (Signed)
This patient returns to my office for at risk foot care.  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  Patient is taking plavix.  Patient has history of CVA.  This patient is unable to cut nails himself since the patient cannot reach his nails.These nails are painful walking and wearing shoes. This patient presents for at risk foot care today.  General Appearance  Alert, conversant and in no acute stress.  Vascular  Dorsalis pedis and posterior tibial  pulses are weakly  palpable  bilaterally.  Capillary return is within normal limits  Bilaterally.Cold feet left.  .  Absent digital hair  B/L.  Neurologic  Senn-Weinstein monofilament wire test diminished  bilaterally. Muscle power within normal limits bilaterally.  Nails Thick disfigured discolored nails with subungual debris  from hallux to fifth toes bilaterally. No evidence of bacterial infection or drainage bilaterally.  Orthopedic  No limitations of motion  feet .  No crepitus or effusions noted.  No bony pathology or digital deformities noted.  Skin  normotropic skin with no porokeratosis noted bilaterally.  No signs of infections or ulcers noted.  Midfoot DJD left..  Hammer toes  B/L.   Onychomycosis  Pain in right toes  Pain in left toes  Neuropathic pain.  Consent was obtained for treatment procedures.   Mechanical debridement of nails 1-5  bilaterally performed with a nail nipper.  Filed with dremel without incident.     Return office visit   3 months                   Told patient to return for periodic foot care and evaluation due to potential at risk complications.   Helane Gunther DPM

## 2023-08-12 ENCOUNTER — Telehealth: Payer: Self-pay

## 2023-08-12 NOTE — Telephone Encounter (Signed)
...  Pre-operative Risk Assessment    Patient Name: [name]  DOB: 06/26/1951 MRN: [id number]      Request for Surgical Clearance    Procedure:   GALLBLADDER SURGERY  Date of Surgery:  Clearance TBD                                 Surgeon:  DR Harriette Bouillon Surgeon's Group or Practice Name:  [facility] Phone number:  [phone] Fax number:  [phone]   Type of Clearance Requested:   - Medical  - Pharmacy:  Hold Aspirin     Type of Anesthesia:  General    Additional requests/questions:   LAST APPT 06/27/23, NEXT APPT 08/30/23  Signed, Renee Ramus   08/12/2023, 12:46 PM

## 2023-08-15 NOTE — Telephone Encounter (Addendum)
Helping in preop today. Per chart review, patient has h/o CAD s/p multiple PCI and CVA. Per Dr. Leonides Sake note, "[name] had PCI with a BMS to OM while living in [place]. He had in-stent restenosis in 2005 and had a DES placed. [name] was seen in the ED 06/2020 with NSTEMI. He underwent cardiac catheterization. His cath 8/19 revealed 95% stenosis of the ostial OM 2 just proximal to his prior OM stent. A drug-eluting stent was successfully placed."  Given that we do not have prior cath reports and he has hx of ISR, falls outside perioperative antiplatelet algorithm therefore need input on Dr. Duke Salvia as to whether he can hold aspirin in prep for gallbladder surgery. At recent OV, was complaining of some left shoulder tightness. Medical therapy advised. He has follow-up in our office scheduled 08/30/23 - I am inclined to recommend he keep this f/u before clearing for surgery, await MD input. Dr. Duke Salvia - Please route response to P CV DIV PREOP (the pre-op pool). Thank you.

## 2023-08-24 NOTE — Telephone Encounter (Signed)
Patient Name: [name]  DOB: 1951-03-17 MRN: [id number]  Primary Cardiologist: Chilton Si, MD  Chart reviewed as part of pre-operative protocol coverage. Given past medical history and time since last visit, based on ACC/AHA guidelines, [name] is at acceptable risk for the planned procedure without further cardiovascular testing.  Patient had previous stress testing completed 12/2022 and is fine to proceed with scheduled procedure at this time.  Patient can hold Plavix 5 days prior to procedure and aspirin 7 days prior to procedure.  Patient should restart postprocedure when surgically safe as soon as possible.  The patient was advised that if he develops new symptoms prior to surgery to contact our office to arrange for a follow-up visit, and he verbalized understanding.  I will route this recommendation to the requesting party via Epic fax function and remove from pre-op pool.  Please call with questions.  Napoleon Form, Leodis Rains, NP 08/24/2023, 3:19 PM

## 2023-08-26 ENCOUNTER — Ambulatory Visit
Admission: EM | Admit: 2023-08-26 | Discharge: 2023-08-26 | Disposition: A | Discharge status: Home / Self Care | Payer: Medicare HMO | Attending: Emergency Medicine | Admitting: Emergency Medicine

## 2023-08-26 DIAGNOSIS — Z1152 Encounter for screening for COVID-19: Secondary | ICD-10-CM | POA: Insufficient documentation

## 2023-08-26 DIAGNOSIS — B9789 Other viral agents as the cause of diseases classified elsewhere: Secondary | ICD-10-CM | POA: Insufficient documentation

## 2023-08-26 DIAGNOSIS — J069 Acute upper respiratory infection, unspecified: Secondary | ICD-10-CM | POA: Diagnosis present

## 2023-08-26 LAB — POCT INFLUENZA A/B
Influenza A, POC: NEGATIVE
Influenza B, POC: NEGATIVE

## 2023-08-26 LAB — POCT RAPID STREP A (OFFICE): Rapid Strep A Screen: NEGATIVE

## 2023-08-26 MED ORDER — LIDOCAINE VISCOUS HCL 2 % MT SOLN: 5.0000 mL | Freq: Three times a day (TID) | OROMUCOSAL | 0 refills | Status: DC | PRN

## 2023-08-26 MED ORDER — BENZONATATE 100 MG PO CAPS
100.0000 mg | ORAL_CAPSULE | Freq: Three times a day (TID) | ORAL | 0 refills | Status: DC
Start: 2023-08-26 — End: 2023-12-09

## 2023-08-26 MED ORDER — ACETAMINOPHEN 325 MG PO TABS
650.0000 mg | ORAL_TABLET | Freq: Once | ORAL | Status: AC
  Administered 2023-08-26: 650 mg via ORAL

## 2023-08-26 NOTE — ED Triage Notes (Signed)
Pt states dry cough,runny nose,body aches and sweating since yesterday. Has not taken anything at home for his symptoms.

## 2023-08-26 NOTE — ED Provider Notes (Signed)
[facility]    CSN: [id number] Arrival date & time: 08/26/23  1003      History   Chief Complaint Chief Complaint  Patient presents with   Cough    HPI [name] is a 72 y.o. male.   Patient presents with concerns of feeling unwell since yesterday. He reports feeling feverish, fatigue, headache, body aches, nasal congestion, ear discomfort, sore throat, and dry cough. He states his throat mainly hurts on the right and has right ear discomfort. The patient has not taken his temperature at home. The patient denies productive cough, n/v/d, or difficulty breathing. He has not taken anything for his symptoms. The patient denies known sick contacts.   The history is provided by the patient.  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   Chronic kidney disease    Constipation    takes Carafate four times day   DIAB W/UNSPEC COMP TYPE II/UNSPEC TYPE UNCNTRL    ERECTILE DYSFUNCTION    GERD    HYPERLIPIDEMIA-MIXED    HYPERTENSION, BENIGN    LBBB (left bundle branch block) 02/02/2016   MORTON'S NEUROMA, RIGHT    OSA on CPAP 09/09/2020   Peripheral neuropathy    Seasonal allergies    takes Allegra and Benadryl daily prn;uses Flonase daily   SHOULDER PAIN, RIGHT    Snoring 09/09/2020   Stroke, 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, s/p CIR    TIA on medication 02/2012   Unstable angina (HCC) 07/09/2020    Patient Active Problem List   Diagnosis Date Noted   Pancreatic mass -MRI for follow-up due 06/2024 07/19/2023   Calculus of gallbladder without cholecystitis without obstruction 07/19/2023    Constipation 07/19/2023   Intractable nausea and vomiting 07/14/2023   Epigastric pain 07/14/2023   Transaminitis 07/14/2023   Back pain 03/04/2023   Diarrhea 06/09/2022   Ganglion cyst of wrist, left 12/14/2021   Urinary frequency 11/06/2021   Coronary artery disease 03/25/2021   Hoarse 03/25/2021   Major depressive disorder, recurrent episode, moderate (HCC) 03/25/2021   Major depressive disorder, single episode 03/25/2021   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   Observation and evaluation for other specified suspected conditions 03/25/2021   Vocal cord paralysis 03/25/2021   Diabetic peripheral neuropathy (HCC) 12/25/2020   Orthopnea 12/16/2020   OSA on CPAP 09/09/2020   Unstable angina (HCC) 07/09/2020   Sciatica of left side 07/11/2019   Cough 07/11/2019   Edema leg 10/26/2018   History of stroke 10/26/2018   DOE (dyspnea on exertion) 10/26/2018   Left hip pain 07/14/2018   Acute left ankle pain 07/14/2018   Chest pain 07/14/2018   Acute non-recurrent ethmoidal sinusitis 03/13/2018   Nonintractable headache 02/24/2018   CKD stage 3b, GFR 30-44 ml/min (HCC) 02/22/2017   Anemia of chronic disease 02/16/2017   Lower respiratory infection 02/16/2017   Chronic diastolic CHF (congestive heart failure) (HCC) 06/29/2016   LBBB (left bundle branch block) 02/02/2016   Nonallopathic lesion of lumbosacral region 12/11/2014   Nonallopathic lesion of sacral region 12/11/2014   Nonallopathic lesion of thoracic region 12/11/2014  Arthritis of left hip 11/20/2014   Ischial bursitis of left side 10/28/2014   Hamstring tightness of left lower extremity 09/16/2014   Piriformis syndrome of left side 09/16/2014   Dysphonia 06/22/2013   Hemiparesis affecting left side as late effect of stroke (HCC) 10/10/2012   Dysphagia following cerebrovascular accident 08/14/2012   Chronic back pain    Peripheral neuropathy (HCC)    TIA (transient ischemic attack) 04/08/2012   Stroke (HCC) 02/21/2012    MORTON'S NEUROMA, RIGHT 05/29/2010   DM2 (diabetes mellitus, type 2) (HCC) 04/13/2010   Allergic rhinitis 04/13/2010   GERD (gastroesophageal reflux disease) 04/13/2010   ERECTILE DYSFUNCTION 03/12/2009   Essential hypertension 03/12/2009   CAD S/P percutaneous coronary angioplasty 03/12/2009   Hyperlipidemia 03/07/2009    Past Surgical History:  Procedure Laterality Date   ANGIOPLASTY     COLONOSCOPY     CORONARY ANGIOPLASTY  2005   2 stents   CORONARY STENT INTERVENTION N/A 07/10/2020   Procedure: CORONARY STENT INTERVENTION;  Surgeon: Swaziland, Peter M, MD;  Location: [facility] INVASIVE CV LAB;  Service: Cardiovascular;  Laterality: N/A;   DENTAL SURGERY     LARYNGOPLASTY  08/07/2012   Procedure: LARYNGOPLASTY;  Surgeon: Serena Colonel, MD;  Location: [facility] OR;  Service: ENT;  Laterality: Left;  Left Vocal Cord Medialyzation   LEFT HEART CATH AND CORONARY ANGIOGRAPHY N/A 07/10/2020   Procedure: LEFT HEART CATH AND CORONARY ANGIOGRAPHY;  Surgeon: Swaziland, Peter M, MD;  Location: [facility] INVASIVE CV LAB;  Service: Cardiovascular;  Laterality: N/A;   left knee surgury     x 2    stent  2001, 2004   coronary stents       Home Medications    Prior to Admission medications   Medication Sig Start Date End Date Taking? Authorizing Provider  benzonatate (TESSALON) 100 MG capsule Take 1 capsule (100 mg total) by mouth every 8 (eight) hours. 08/26/23  Yes Adore Kithcart L, PA  magic mouthwash (lidocaine, diphenhydrAMINE, alum & mag hydroxide) suspension Swish and spit 5 mLs 3 (three) times daily as needed for mouth pain. 08/26/23  Yes Aide Wojnar L, PA  aspirin EC 81 MG tablet Take 1 tablet (81 mg total) by mouth daily. Swallow whole. 07/10/20   Dunn, Tacey Ruiz, PA-C  atorvastatin (LIPITOR) 40 MG tablet Take 1 tablet (40 mg total) by mouth daily. 05/03/23   Alver Sorrow, NP  bisacodyl (DULCOLAX) 10 MG suppository Place 1 suppository (10 mg total) rectally as needed for severe constipation. 07/19/23   Pincus Sanes, MD  Blood Pressure Monitoring (BLOOD PRESSURE CUFF) MISC 1 Units by Does not apply route daily. 02/03/21   Chilton Si, MD  calcium carbonate (TUMS - DOSED IN MG ELEMENTAL CALCIUM) 500 MG chewable tablet Chew 1 tablet by mouth daily as needed for indigestion or heartburn.    [provider]  carvedilol (COREG) 25 MG tablet Take 1 tablet (25 mg total) by mouth 2 (two) times daily. 01/25/23 01/20/24  Alver Sorrow, NP  chlorthalidone (HYGROTON) 25 MG tablet TAKE 1 TABLET EVERY DAY 02/07/23   Chilton Si, MD  clopidogrel (PLAVIX) 75 MG tablet TAKE 1 TABLET EVERY DAY. NEED APPOINTMENT FOR FURTHER REFILLS 02/16/23   Chilton Si, MD  Continuous Glucose Sensor (FREESTYLE LIBRE 2 SENSOR) MISC 1 Device by Does not apply route every 14 (fourteen) days. 08/09/23   Carlus Pavlov, MD  furosemide (LASIX) 40 MG tablet TAKE 1 TABLET (40 MG TOTAL) BY MOUTH DAILY. NEED APPT. 09/16/21  Chilton Si, MD  glucose blood (ACCU-CHEK AVIVA PLUS) test strip uad tid 12/14/21   Pincus Sanes, MD  insulin glargine (LANTUS SOLOSTAR) 100 UNIT/ML Solostar Pen Inject 14 Units into the skin daily. 04/05/23   Carlus Pavlov, MD  insulin lispro (HUMALOG KWIKPEN) 100 UNIT/ML KwikPen Inject 6-10 Units into the skin 2 (two) times daily before a meal. 04/05/23   Carlus Pavlov, MD  Insulin Pen Needle 32G X 4 MM MISC Use 3x a day 04/05/23   Carlus Pavlov, MD  losartan (COZAAR) 100 MG tablet Take 1 tablet (100 mg total) by mouth daily. 04/04/23   Alver Sorrow, NP  Microlet Lancets MISC UAD to check sugars TID.  E11.22 12/14/21   Pincus Sanes, MD  nitroGLYCERIN (NITROSTAT) 0.4 MG SL tablet Place 1 tablet (0.4 mg total) under the tongue every 5 (five) minutes as needed for chest pain (up to 3 doses). 12/23/22 12/23/23  Chilton Si, MD  pantoprazole (PROTONIX) 40 MG tablet Take 1 tablet (40 mg total) by mouth daily. 07/16/23 08/15/23  Lanae Boast, MD  polyethylene glycol (MIRALAX) 17 g packet  Take 17 g by mouth daily. 07/16/23   Lanae Boast, MD  senna-docusate (SENOKOT-S) 8.6-50 MG per tablet Take 2 tablets by mouth 2 (two) times daily. For constipation. 09/07/12   Love, Evlyn Kanner, PA-C  tiZANidine (ZANAFLEX) 2 MG tablet Take 1 tablet (2 mg total) by mouth at bedtime as needed for muscle spasms. 03/04/23   Pincus Sanes, MD    Family History Family History  Problem Relation Age of Onset   Cancer Mother    Liver cancer Brother    Colon cancer Neg Hx    Esophageal cancer Neg Hx     Social History Social History   Tobacco Use   Smoking status: Former    Current packs/day: 0.00    Average packs/day: 2.5 packs/day for 9.0 years (22.5 ttl pk-yrs)    Types: Cigarettes    Start date: 02/23/1974    Quit date: 02/24/1983    Years since quitting: 40.5   Smokeless tobacco: Never  Vaping Use   Vaping status: Never Used  Substance Use Topics   Alcohol use: No    Alcohol/week: 0.0 standard drinks of alcohol   Drug use: No     Allergies   Penicillins, Pneumococcal vaccines, Shellfish allergy, Barbiturates, Gabapentin, Influenza vaccines, Lisinopril, Other, Sildenafil, Topiramate, Zofran [ondansetron], Amlodipine, Levitra [vardenafil], Metformin, and Tadalafil   Review of Systems Review of Systems  Constitutional:  Positive for fatigue and fever.  HENT:  Positive for congestion, ear pain, rhinorrhea and sore throat. Negative for ear discharge and trouble swallowing.   Respiratory:  Positive for cough. Negative for shortness of breath and wheezing.   Gastrointestinal:  Negative for diarrhea, nausea and vomiting.  Musculoskeletal:  Positive for myalgias.  Skin:  Negative for rash.  Neurological:  Positive for headaches. Negative for dizziness.     Physical Exam Triage Vital Signs ED Triage Vitals  Encounter Vitals Group     BP --      Systolic BP Percentile --      Diastolic BP Percentile --      Pulse Rate 08/26/23 1013 87     Resp 08/26/23 1013 16     Temp 08/26/23  1013 (!) 102 F (38.9 C)     Temp Source 08/26/23 1013 Oral     SpO2 08/26/23 1013 96 %     Weight --      Height --  Head Circumference --      Peak Flow --      Pain Score 08/26/23 1015 9     Pain Loc --      Pain Education --      Exclude from Growth Chart --    No data found.  Updated Vital Signs Pulse 87   Temp (!) 102 F (38.9 C) (Oral)   Resp 16   SpO2 96%   Visual Acuity Right Eye Distance:   Left Eye Distance:   Bilateral Distance:    Right Eye Near:   Left Eye Near:    Bilateral Near:     Physical Exam Vitals and nursing note reviewed.  Constitutional:      General: He is not in acute distress.    Appearance: He is ill-appearing.  HENT:     Head: Normocephalic and atraumatic.     Right Ear: Tympanic membrane, ear canal and external ear normal.     Left Ear: Tympanic membrane, ear canal and external ear normal.     Nose: Congestion and rhinorrhea present.     Mouth/Throat:     Pharynx: Oropharynx is clear. Uvula midline. Posterior oropharyngeal erythema (mild) present.     Tonsils: No tonsillar exudate.  Eyes:     Conjunctiva/sclera: Conjunctivae normal.     Pupils: Pupils are equal, round, and reactive to light.  Cardiovascular:     Rate and Rhythm: Normal rate and regular rhythm.     Heart sounds: Normal heart sounds.  Pulmonary:     Effort: Pulmonary effort is normal. No respiratory distress.     Breath sounds: Normal breath sounds. No stridor. No wheezing, rhonchi or rales.  Musculoskeletal:     Cervical back: Normal range of motion.  Lymphadenopathy:     Cervical: Cervical adenopathy present.     Right cervical: Superficial cervical adenopathy present.     Left cervical: No superficial cervical adenopathy.  Skin:    Findings: No rash.  Neurological:     Mental Status: He is alert.  Psychiatric:        Mood and Affect: Mood normal.      UC Treatments / Results  Labs (all labs ordered are listed, but only abnormal results are  displayed) Labs Reviewed  SARS CORONAVIRUS 2 (TAT 6-24 HRS)  POCT INFLUENZA A/B  POCT RAPID STREP A (OFFICE)    EKG   Radiology No results found.  Procedures Procedures (including critical care time)  Medications Ordered in UC Medications  acetaminophen (TYLENOL) tablet 650 mg (650 mg Oral Given 08/26/23 1020)    Initial Impression / Assessment and Plan / UC Course  I have reviewed the triage vital signs and the nursing notes.  Pertinent labs & imaging results that were available during my care of the patient were reviewed by me and considered in my medical decision making (see chart for details).     Rapid strep and flu negative. COVID send out. Consistent with viral URI - COVID or other virus. Sx tx and reassurance. Discussed return/ER precautions. If COVID returns positive, consider sending in Paxlovid given age and comorbidities.   E/M: 1 acute uncomplicated illness, 3 data (flu, strep, covid), moderate risk due to prescription management  Final Clinical Impressions(s) / UC Diagnoses   Final diagnoses:  Viral URI     Discharge Instructions      The rapid flu and strep tests were negative. We are sending out a COVID test and will contact you with results. Rest,  keep hydrated. Take Tylenol as directed to help with fever and discomfort. Take cough medication as prescribed as needed. Use mouthwash as prescribed as needed to help with sore throat. Follow-up with PCP if no improvement in a week.  Go to the ER if develop difficulty breathing, chest pain, or inability to swallow.      ED Prescriptions     Medication Sig Dispense Auth. Provider   benzonatate (TESSALON) 100 MG capsule Take 1 capsule (100 mg total) by mouth every 8 (eight) hours. 21 capsule Vallery Sa, Mahlia Fernando L, PA   magic mouthwash (lidocaine, diphenhydrAMINE, alum & mag hydroxide) suspension Swish and spit 5 mLs 3 (three) times daily as needed for mouth pain. 360 mL Vallery Sa, Aidyn Sportsman L, PA      PDMP not reviewed  this encounter.   Estanislado Pandy, [place] 08/26/23 1100

## 2023-08-26 NOTE — Discharge Instructions (Addendum)
The rapid flu and strep tests were negative. We are sending out a COVID test and will contact you with results. Rest, keep hydrated. Take Tylenol as directed to help with fever and discomfort. Take cough medication as prescribed as needed. Use mouthwash as prescribed as needed to help with sore throat. Follow-up with PCP if no improvement in a week.  Go to the ER if develop difficulty breathing, chest pain, or inability to swallow.

## 2023-08-27 LAB — SARS CORONAVIRUS 2 (TAT 6-24 HRS): SARS Coronavirus 2: NEGATIVE

## 2023-08-30 ENCOUNTER — Other Ambulatory Visit (HOSPITAL_BASED_OUTPATIENT_CLINIC_OR_DEPARTMENT_OTHER): Payer: Self-pay

## 2023-08-30 ENCOUNTER — Encounter (HOSPITAL_BASED_OUTPATIENT_CLINIC_OR_DEPARTMENT_OTHER): Payer: Self-pay | Admitting: Family

## 2023-08-30 ENCOUNTER — Ambulatory Visit (HOSPITAL_BASED_OUTPATIENT_CLINIC_OR_DEPARTMENT_OTHER): Payer: Medicare HMO | Admitting: Family

## 2023-08-30 VITALS — BP 112/70 | HR 70 | Ht 71.0 in | Wt 207.0 lb

## 2023-08-30 DIAGNOSIS — R051 Acute cough: Secondary | ICD-10-CM

## 2023-08-30 DIAGNOSIS — I10 Essential (primary) hypertension: Secondary | ICD-10-CM

## 2023-08-30 DIAGNOSIS — J029 Acute pharyngitis, unspecified: Secondary | ICD-10-CM | POA: Diagnosis not present

## 2023-08-30 DIAGNOSIS — I25118 Atherosclerotic heart disease of native coronary artery with other forms of angina pectoris: Secondary | ICD-10-CM | POA: Diagnosis not present

## 2023-08-30 DIAGNOSIS — G4733 Obstructive sleep apnea (adult) (pediatric): Secondary | ICD-10-CM

## 2023-08-30 DIAGNOSIS — E785 Hyperlipidemia, unspecified: Secondary | ICD-10-CM

## 2023-08-30 DIAGNOSIS — I5042 Chronic combined systolic (congestive) and diastolic (congestive) heart failure: Secondary | ICD-10-CM

## 2023-08-30 MED ORDER — LIDOCAINE VISCOUS HCL 2 % MT SOLN
5.0000 mL | Freq: Three times a day (TID) | OROMUCOSAL | 0 refills | Status: DC
  Filled 2023-08-30: qty 360, 24d supply, fill #0

## 2023-08-30 MED ORDER — PROMETHAZINE-DM 6.25-15 MG/5ML PO SYRP
2.5000 mL | ORAL_SOLUTION | Freq: Four times a day (QID) | ORAL | 0 refills | Status: DC | PRN
Start: 2023-08-30 — End: 2023-12-09
  Filled 2023-08-30: qty 118, 12d supply, fill #0

## 2023-08-30 NOTE — Patient Instructions (Signed)
Medication Instructions:  Your physician has recommended you make the following change in your medication:   CONTINUE Tessalon as needed for cough  START Magic Mouthwash three times per day as needed for sore throat (just swish it in your mouth and spit it out)  START promethazine-dextromethorphan as needed for cough Recommend using mostly in the evening   If you are not feeling any better by Friday, recommend reaching out to primary care  *If you need a refill on your cardiac medications before your next appointment, please call your pharmacy*  Follow-Up: At [facility], you and your health needs are our priority.  As part of our continuing mission to provide you with exceptional heart care, we have created designated Provider Care Teams.  These Care Teams include your primary Cardiologist (physician) and Advanced Practice Providers (APPs -  Physician Assistants and Nurse Practitioners) who all work together to provide you with the care you need, when you need it.  We recommend signing up for the patient portal called "MyChart".  Sign up information is provided on this After Visit Summary.  MyChart is used to connect with patients for Virtual Visits (Telemedicine).  Patients are able to view lab/test results, encounter notes, upcoming appointments, etc.  Non-urgent messages can be sent to your provider as well.   To learn more about what you can do with MyChart, go to [email or web address].    Your next appointment:   5-6 month(s)  Provider:   Chilton Si, MD    Other Instructions

## 2023-08-30 NOTE — Progress Notes (Signed)
Cardiology Office Note:  .   Date:  08/30/2023  ID:  [name], DOB Dec 12, 1950, MRN [id number] PCP: Pincus Sanes, MD  [facility] Providers Cardiologist:  Chilton Si, MD    History of Present Illness: .   [name] is a 72 y.o. male  with a hx of HTN, CAD s/p multiple PCI, CVA, DM2, HLD, prior tobacco use  Prior PCI with BMS-OM while living in [place]. He had ISR in 2005 and DES placed. Myoview 02/13/16 for new LBBB with LVEF 39%, global hypkinesis, no ischemia. Echo 03/01/16 LVEF 40-45%, mild LVH, gr2DD, mild hypokinesis mid to apical anteroseptum.    ED visit 06/2020 with two month history of chest pain but did not stay to be evaluated. Seen in follow up a few days later with HS troponin 124. Underwent LHC 07/10/20 95% stenosis of ostial OM2 just proximal to prior OM stent. DES was successfully placed. Repeat echo 07/2020 LVEF 55%, gr1DD, RA pressure . Referred for sleep study which revealed severe OSA.   Myoview 07/2021 due to increased exertional dyspnea LVEF 42% with anterior and apical hypokinesis. There was fixed inferior and apical defect consistent with prior infarct but no ischemia.    Seen 12/23/22 noting exertional dyspnea. Initial plan for LHC however due to renal function, subsequent myoview revealed prior infarct but no new ischemia. CBC revealed anemia recommended for repeat CBC in one week.   Seen 01/25/23 .  For simplification of medication regimen amlodipine-atorvastatin prescribed as combination pill.  However he later noted allergy to amlodipine despite being on his prior medication list and he was transition to atorvastatin only.  Carvedilol simplified to 25 mg twice daily.  Due to persistent anemia he was referred to GI. Echo 02/15/23 normal LVEF, mild LVH, no significant valvular abnormalities.    Admitted 8/22-8/24/24 for chest pain with nausea. Troponin negative x 2.   Presents today for follow-up independently.  Notes he is not feeling well.   Had urgent care visit 08/26/2023 for URI symptoms and was negative for strep/flu/COVID.  Reports he was febrile day of urgent care visit but has not checked his temperature since that time.  Does note feeling chills.  Symptomatic with severe coughing spells (productive with green phlegm), diarrhea, uncontrolled urination, sore throat, upper airway wheeze with cough, maxillary sinus tenderness. These fevers have been unchanged. Has not taken Immodium.  No dyspnea, chest pain.  Notes Tessalon is decreasing cough a little. Most bothersome at night. Congestion seems to be in chest and throat.  BP at home range 111/67-150/75 **   ROS: Please see the history of present illness.    All other systems reviewed and are negative.   Studies Reviewed: .        Risk Assessment/Calculations:             Physical Exam:   VS:  BP 112/70   Pulse 70   Ht 5\' 11"  (1.803 m)   Wt 207 lb (93.9 kg)   SpO2 98%   BMI 28.87 kg/m    Wt Readings from Last 3 Encounters:  08/30/23 207 lb (93.9 kg)  08/09/23 217 lb (98.4 kg)  07/19/23 214 lb (97.1 kg)    GEN: Well nourished, well developed in no acute distress NECK: No JVD; No carotid bruits CARDIAC: RRR, no murmurs, rubs, gallops RESPIRATORY:  Clear to auscultation without rales, wheezing or rhonchi  ABDOMEN: Soft, non-tender, non-distended EXTREMITIES:  No edema; No deformity   ASSESSMENT AND PLAN: .  Cough / URI - Onset 08/25/23. Urgent care visit 08/26/23 given Tessalon and [employer]. [facility] was unable to fill [employer], will send to [facility] at [employer]. Tessalon still not allowing relief from cough for sleep, Rx Promethazine-Dextromethorphan PRN for cough. Symptoms detailed in HPI with onset <1 week, anticipate viral. COVID/flu/strep testing negative at urgent care.  Continue with symptomatic treatment and he will contact his PCP if symptoms do not start to improve by Friday (one week from onset of symptoms). Recommend OTC  Immodium PRN for diarrhea but cautious use to avoid constipation.   CAD s/p multiple PCI - OM has required stenting twice. Lifelong DAPT. Known CTO PDA with L-R collateralization. Myoview 01/05/23 with prior infarct but no new ischemia. Stable with no anginal symptoms. No indication for ischemic evaluation.  GDMT aspirin, plavix, amlodipine-atorvastatin, PRN nitroglycerin.  Combined systolic and diastolic heart failure - Euvolemic and well compensated on exam.  Echo 02/15/2023 normal LVEF.  GDMT includes Lasix 40mg  QD, Carvedilol 25mg  BID, Losartan 100mg  QD, Jardiacne 25mg  QD. Continue same.   HTN- BP well controlled. Continue current antihypertensive regimen.  Discussed to monitor BP at home at least 2 hours after medications and sitting for 5-10 minutes.   OSA - CPAP compliance encouraged.   CKD - Careful titration of diuretic and antihypertensive.    DM2 - follows with endocrinology.  Hx of CVA - Continue aspirin, plavix, atorvastatin.        Dispo: follow up in 6 months  Signed, Alver Sorrow, NP

## 2023-09-21 ENCOUNTER — Other Ambulatory Visit: Payer: Self-pay | Admitting: Internal Medicine

## 2023-09-28 ENCOUNTER — Ambulatory Visit: Payer: Medicare HMO

## 2023-09-28 VITALS — Ht 71.0 in | Wt 210.0 lb

## 2023-09-28 DIAGNOSIS — Z0000 Encounter for general adult medical examination without abnormal findings: Secondary | ICD-10-CM | POA: Diagnosis not present

## 2023-09-28 NOTE — Progress Notes (Signed)
Subjective:   [name] is a 72 y.o. male who presents for Medicare Annual/Subsequent preventive examination.  Visit Complete: Virtual I connected with  [name] on 09/28/23 by a audio enabled telemedicine application and verified that I am speaking with the correct person using two identifiers.  Patient Location: Home  Provider Location: Office/Clinic  I discussed the limitations of evaluation and management by telemedicine. The patient expressed understanding and agreed to proceed.  Vital Signs: Because this visit was a virtual/telehealth visit, some criteria may be missing or patient reported. Any vitals not documented were not able to be obtained and vitals that have been documented are patient reported.  Cardiac Risk Factors include: advanced age (>46men, >44 women);sedentary lifestyle;diabetes mellitus;dyslipidemia;hypertension;male gender     Objective:    Today's Vitals   09/28/23 1511  Weight: 210 lb (95.3 kg)  Height: 5\' 11"  (1.803 m)  PainSc: 8   PainLoc: Generalized   Body mass index is 29.29 kg/m.     09/28/2023    3:15 PM 07/14/2023   12:15 PM 03/08/2023    3:36 PM 03/05/2022    1:12 PM 12/08/2021   10:19 AM 01/28/2021    9:36 AM 12/16/2020    7:54 PM  Advanced Directives  Does Patient Have a Medical Advance Directive? No No No No No No Yes  Type of Tax inspector;Living will  Does patient want to make changes to medical advance directive?    No - Patient declined   No - Patient declined  Copy of Healthcare Power of Attorney in Chart?       No - copy requested  Would patient like information on creating a medical advance directive? No - Patient declined No - Patient declined   No - Patient declined No - Patient declined     Current Medications (verified) Outpatient Encounter Medications as of 09/28/2023  Medication Sig   aspirin EC 81 MG tablet Take 1 tablet (81 mg total) by mouth daily. Swallow whole.    atorvastatin (LIPITOR) 40 MG tablet Take 1 tablet (40 mg total) by mouth daily.   BD PEN NEEDLE NANO 2ND GEN 32G X 4 MM MISC USE AS DIRECTED THREE TIMES DAILY   benzonatate (TESSALON) 100 MG capsule Take 1 capsule (100 mg total) by mouth every 8 (eight) hours.   bisacodyl (DULCOLAX) 10 MG suppository Place 1 suppository (10 mg total) rectally as needed for severe constipation.   Blood Pressure Monitoring (BLOOD PRESSURE CUFF) MISC 1 Units by Does not apply route daily.   calcium carbonate (TUMS - DOSED IN MG ELEMENTAL CALCIUM) 500 MG chewable tablet Chew 1 tablet by mouth daily as needed for indigestion or heartburn.   carvedilol (COREG) 25 MG tablet Take 1 tablet (25 mg total) by mouth 2 (two) times daily.   chlorthalidone (HYGROTON) 25 MG tablet TAKE 1 TABLET EVERY DAY   clopidogrel (PLAVIX) 75 MG tablet TAKE 1 TABLET EVERY DAY. NEED APPOINTMENT FOR FURTHER REFILLS   Continuous Glucose Sensor (FREESTYLE LIBRE 2 SENSOR) MISC 1 Device by Does not apply route every 14 (fourteen) days.   furosemide (LASIX) 40 MG tablet TAKE 1 TABLET (40 MG TOTAL) BY MOUTH DAILY. NEED APPT.   glucose blood (ACCU-CHEK AVIVA PLUS) test strip uad tid   insulin glargine (LANTUS SOLOSTAR) 100 UNIT/ML Solostar Pen Inject 14 Units into the skin daily.   insulin lispro (HUMALOG KWIKPEN) 100 UNIT/ML KwikPen Inject 6-10 Units into the skin 2 (  two) times daily before a meal.   losartan (COZAAR) 100 MG tablet Take 1 tablet (100 mg total) by mouth daily.   magic mouthwash (lidocaine, diphenhydrAMINE, alum & mag hydroxide) suspension Swish and spit 5 mLs 3 (three) times daily as needed for mouth pain.   magic mouthwash (lidocaine, diphenhydrAMINE, alum & mag hydroxide) suspension Swish and spit 5 mLs 3 (three) times daily.   Microlet Lancets MISC UAD to check sugars TID.  E11.22   nitroGLYCERIN (NITROSTAT) 0.4 MG SL tablet Place 1 tablet (0.4 mg total) under the tongue every 5 (five) minutes as needed for chest pain (up to 3  doses).   pantoprazole (PROTONIX) 40 MG tablet Take 1 tablet (40 mg total) by mouth daily.   polyethylene glycol (MIRALAX) 17 g packet Take 17 g by mouth daily.   promethazine-dextromethorphan (PROMETHAZINE-DM) 6.25-15 MG/5ML syrup Take 2.5 mLs by mouth 4 (four) times daily as needed for cough.   senna-docusate (SENOKOT-S) 8.6-50 MG per tablet Take 2 tablets by mouth 2 (two) times daily. For constipation.   tiZANidine (ZANAFLEX) 2 MG tablet Take 1 tablet (2 mg total) by mouth at bedtime as needed for muscle spasms.   No facility-administered encounter medications on file as of 09/28/2023.    Allergies (verified) Penicillins, Pneumococcal vaccines, Shellfish allergy, Barbiturates, Gabapentin, Influenza vaccines, Lisinopril, Other, Sildenafil, Topiramate, Zofran [ondansetron], Amlodipine, Levitra [vardenafil], Metformin, and Tadalafil   History: Past Medical History:  Diagnosis Date   ALLERGIC RHINITIS    CAD, NATIVE VESSEL    BMS to OM1 2001, DES to BMS 2005   Chronic back pain    herniated disc   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   Chronic kidney disease    Constipation    takes Carafate four times day   DIAB W/UNSPEC COMP TYPE II/UNSPEC TYPE UNCNTRL    ERECTILE DYSFUNCTION    GERD    HYPERLIPIDEMIA-MIXED    HYPERTENSION, BENIGN    LBBB (left bundle branch block) 02/02/2016   MORTON'S NEUROMA, RIGHT    OSA on CPAP 09/09/2020   Peripheral neuropathy    Seasonal allergies    takes Allegra and Benadryl daily prn;uses Flonase daily   SHOULDER PAIN, RIGHT    Snoring 09/09/2020   Stroke, 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, s/p CIR    TIA on medication 02/2012   Unstable angina (HCC) 07/09/2020   Past Surgical History:  Procedure Laterality Date   ANGIOPLASTY     COLONOSCOPY     CORONARY ANGIOPLASTY  2005   2 stents   CORONARY STENT INTERVENTION N/A 07/10/2020   Procedure: CORONARY STENT INTERVENTION;  Surgeon: Swaziland, Peter M,  MD;  Location: [facility] INVASIVE CV LAB;  Service: Cardiovascular;  Laterality: N/A;   DENTAL SURGERY     LARYNGOPLASTY  08/07/2012   Procedure: LARYNGOPLASTY;  Surgeon: Serena Colonel, MD;  Location: [facility] OR;  Service: ENT;  Laterality: Left;  Left Vocal Cord Medialyzation   LEFT HEART CATH AND CORONARY ANGIOGRAPHY N/A 07/10/2020   Procedure: LEFT HEART CATH AND CORONARY ANGIOGRAPHY;  Surgeon: Swaziland, Peter M, MD;  Location: [facility] INVASIVE CV LAB;  Service: Cardiovascular;  Laterality: N/A;   left knee surgury     x 2    stent  2001, 2004   coronary stents   Family History  Problem Relation Age of Onset   Cancer Mother    Liver cancer Brother    Colon cancer Neg Hx    Esophageal cancer Neg Hx  Social History   Socioeconomic History   Marital status: Legally Separated    Spouse name: Not on file   Number of children: 3   Years of education: college   Highest education level: Not on file  Occupational History   Occupation: disabled    Employer: [employer]  Tobacco Use   Smoking status: Former    Current packs/day: 0.00    Average packs/day: 2.5 packs/day for 9.0 years (22.5 ttl pk-yrs)    Types: Cigarettes    Start date: 02/23/1974    Quit date: 02/24/1983    Years since quitting: 40.6   Smokeless tobacco: Never  Vaping Use   Vaping status: Never Used  Substance and Sexual Activity   Alcohol use: No    Alcohol/week: 0.0 standard drinks of alcohol   Drug use: No   Sexual activity: Never  Other Topics Concern   Not on file  Social History Narrative      Social Determinants of Health   Financial Resource Strain: Low Risk  (09/28/2023)   Overall Financial Resource Strain (CARDIA)    Difficulty of Paying Living Expenses: Not hard at all  Food Insecurity: No Food Insecurity (09/28/2023)   Hunger Vital Sign    Worried About Running Out of Food in the Last Year: Never true    Ran Out of Food in the Last Year: Never true  Transportation Needs: No Transportation Needs (09/28/2023)    PRAPARE - Administrator, Civil Service (Medical): No    Lack of Transportation (Non-Medical): No  Physical Activity: Inactive (09/28/2023)   Exercise Vital Sign    Days of Exercise per Week: 0 days    Minutes of Exercise per Session: 0 min  Stress: No Stress Concern Present (09/28/2023)   [employer] of Occupational Health - Occupational Stress Questionnaire    Feeling of Stress : Not at all  Social Connections: Socially Isolated (09/28/2023)   Social Connection and Isolation Panel [NHANES]    Frequency of Communication with Friends and Family: Three times a week    Frequency of Social Gatherings with Friends and Family: Three times a week    Attends Religious Services: Never    Active Member of Clubs or Organizations: No    Attends Banker Meetings: Never    Marital Status: Separated    Tobacco Counseling Counseling given: Not Answered   Clinical Intake:  Pre-visit preparation completed: Yes  Pain : 0-10 Pain Score: 8  Pain Type: Chronic pain Pain Location: Generalized Pain Orientation: Left     BMI - recorded: 29.29 Nutritional Status: BMI 25 -29 Overweight Nutritional Risks: None Diabetes: Yes CBG done?: Yes CBG resulted in Enter/ Edit results?: Yes (TODAY IT WAS: 163 THIS MORNING, 120, 128, 151) Did pt. bring in CBG monitor from home?: No  How often do you need to have someone help you when you read instructions, pamphlets, or other written materials from your doctor or pharmacy?: 1 - Never What is the last grade level you completed in school?: HSG; 1.5 YEARS OF COLLEGE  Interpreter Needed?: No  Information entered by :: Susie Cassette, LPN.   Activities of Daily Living    09/28/2023    3:18 PM 07/14/2023    9:28 PM  In your present state of health, do you have any difficulty performing the following activities:  Hearing? 0   Vision? 0   Difficulty concentrating or making decisions? 0   Walking or climbing stairs? 1    Dressing or  bathing? 0   Doing errands, shopping? 0 0  Preparing Food and eating ? N   Using the Toilet? N   In the past six months, have you accidently leaked urine? N   Do you have problems with loss of bowel control? N   Managing your Medications? N   Managing your Finances? N   Housekeeping or managing your Housekeeping? N     Patient Care Team: Pincus Sanes, MD as PCP - General (Internal Medicine) Chilton Si, MD as PCP - Cardiology (Cardiology) Nahser, Deloris Ping, MD (Cardiology) Serena Colonel, MD (Otolaryngology) Micki Riley, MD (Neurology) Lunette Stands, MD (Orthopedic Surgery) Mardella Layman, MD (Gastroenterology) Jay Schlichter, MD as Consulting Physician (Neurology) Sherrie George, MD as Consulting Physician (Ophthalmology)  Indicate any recent Medical Services you may have received from other than [facility] providers in the past year (date may be approximate).     Assessment:   This is a routine wellness examination for [employer].  Hearing/Vision screen Hearing Screening - Comments:: Patient denied any hearing difficulty.   No hearing aids.  Vision Screening - Comments:: Patient does wear corrective lenses/contacts/readers.  Annual eye exam done by: Dr. Alan Mulder    Goals Addressed   None   Depression Screen    09/28/2023    3:18 PM 03/04/2023    3:39 PM 03/08/2022   11:24 AM 03/05/2022    1:16 PM 03/05/2022    1:11 PM 01/28/2021    9:34 AM 10/17/2019    1:02 PM  PHQ 2/9 Scores  PHQ - 2 Score 0 0 0 0 0 0 0  PHQ- 9 Score 1 3         Fall Risk    09/28/2023    3:17 PM 03/04/2023    3:39 PM 03/08/2022   11:24 AM 03/05/2022    1:16 PM 03/05/2022    1:13 PM  Fall Risk   Falls in the past year? 0 0 0 0 0  Number falls in past yr: 0 0  0 0  Injury with Fall? 0 0  0 0  Risk for fall due to : No Fall Risks No Fall Risks     Follow up Falls prevention discussed Falls evaluation completed  Falls evaluation completed Falls evaluation completed   Comment     uses cane    MEDICARE RISK AT HOME: Medicare Risk at Home Any stairs in or around the home?: Yes If so, are there any without handrails?: No Home free of loose throw rugs in walkways, pet beds, electrical cords, etc?: Yes Adequate lighting in your home to reduce risk of falls?: Yes Life alert?: No Use of a cane, walker or w/c?: Yes Grab bars in the bathroom?: Yes Shower chair or bench in shower?: Yes Elevated toilet seat or a handicapped toilet?: Yes  TIMED UP AND GO:  Was the test performed?  No    Cognitive Function:    09/28/2023    3:30 PM 02/24/2018   10:37 AM 07/20/2016   10:37 AM  MMSE - Mini Mental State Exam  Not completed: Unable to complete  --  Orientation to time  5   Orientation to Place  5   Registration  3   Attention/ Calculation  5   Recall  1   Language- name 2 objects  2   Language- repeat  1   Language- follow 3 step command  3   Language- read & follow direction  1   Write  a sentence  1   Copy design  1   Total score  28         09/28/2023    3:18 PM  6CIT Screen  What Year? 0 points  What month? 0 points  What time? 0 points  Count back from 20 0 points  Months in reverse 0 points  Repeat phrase 0 points  Total Score 0 points    Immunizations Immunization History  Administered Date(s) Administered   PFIZER(Purple Top)SARS-COV-2 Vaccination 03/01/2020, 03/26/2020, 10/14/2020   Pfizer Covid-19 Vaccine Bivalent Booster 58yrs & up 11/20/2021   Td 11/22/2004, 04/22/2022   Tdap 11/30/2011   Tetanus 07/13/2012    TDAP status: Up to date  Flu Vaccine status: Declined, Education has been provided regarding the importance of this vaccine but patient still declined. Advised may receive this vaccine at local pharmacy or Health Dept. Aware to provide a copy of the vaccination record if obtained from local pharmacy or Health Dept. Verbalized acceptance and understanding.  Pneumococcal vaccine status: Declined,  Education has been  provided regarding the importance of this vaccine but patient still declined. Advised may receive this vaccine at local pharmacy or Health Dept. Aware to provide a copy of the vaccination record if obtained from local pharmacy or Health Dept. Verbalized acceptance and understanding.   Covid-19 vaccine status: Completed vaccines  Qualifies for Shingles Vaccine? Yes   Zostavax completed No   Shingrix Completed?: No.    Education has been provided regarding the importance of this vaccine. Patient has been advised to call insurance company to determine out of pocket expense if they have not yet received this vaccine. Advised may also receive vaccine at local pharmacy or Health Dept. Verbalized acceptance and understanding.  Screening Tests Health Maintenance  Topic Date Due   Zoster Vaccines- Shingrix (1 of 2) Never done   OPHTHALMOLOGY EXAM  09/23/2018   COVID-19 Vaccine (5 - 2023-24 season) 07/24/2023   Fecal DNA (Cologuard)  01/03/2024   HEMOGLOBIN A1C  01/14/2024   Diabetic kidney evaluation - Urine ACR  02/26/2024   Diabetic kidney evaluation - eGFR measurement  07/18/2024   FOOT EXAM  08/09/2024   Medicare Annual Wellness (AWV)  09/27/2024   DTaP/Tdap/Td (5 - Td or Tdap) 04/22/2032   Hepatitis C Screening  Completed   HPV VACCINES  Aged Out   Pneumonia Vaccine 26+ Years old  Discontinued    Health Maintenance  Health Maintenance Due  Topic Date Due   Zoster Vaccines- Shingrix (1 of 2) Never done   OPHTHALMOLOGY EXAM  09/23/2018   COVID-19 Vaccine (5 - 2023-24 season) 07/24/2023    Colorectal cancer screening: Type of screening: Colonoscopy. Completed 04/26/2023. Repeat every 1 years-per patient  Lung Cancer Screening: (Low Dose CT Chest recommended if Age 58-80 years, 20 pack-year currently smoking OR have quit w/in 15years.) does not qualify.   Lung Cancer Screening Referral: no  Additional Screening:  Hepatitis C Screening: does qualify; Completed 07/14/2023  Vision  Screening: Recommended annual ophthalmology exams for early detection of glaucoma and other disorders of the eye. Is the patient up to date with their annual eye exam?  Yes  Who is the provider or what is the name of the office in which the patient attends annual eye exams? Alan Mulder, MD at [facility] If pt is not established with a provider, would they like to be referred to a provider to establish care? No .   Dental Screening: Recommended annual  dental exams for proper oral hygiene  Diabetic Foot Exam: Diabetic Foot Exam: Completed 08/10/2023  Community Resource Referral / Chronic Care Management: CRR required this visit?  No   CCM required this visit?  No     Plan:     I have personally reviewed and noted the following in the patient's chart:   Medical and social history Use of alcohol, tobacco or illicit drugs  Current medications and supplements including opioid prescriptions. Patient is not currently taking opioid prescriptions. Functional ability and status Nutritional status Physical activity Advanced directives List of other physicians Hospitalizations, surgeries, and ER visits in previous 12 months Vitals Screenings to include cognitive, depression, and falls Referrals and appointments  In addition, I have reviewed and discussed with patient certain preventive protocols, quality metrics, and best practice recommendations. A written personalized care plan for preventive services as well as general preventive health recommendations were provided to patient.     Mickeal Needy, LPN   16/11/958   After Visit Summary: (MyChart) Due to this being a telephonic visit, the after visit summary with patients personalized plan was offered to patient via MyChart   Nurse Notes: None

## 2023-09-28 NOTE — Patient Instructions (Addendum)
[name] , Thank you for taking time to come for your Medicare Wellness Visit. I appreciate your ongoing commitment to your health goals. Please review the following plan we discussed and let me know if I can assist you in the future.   Referrals/Orders/Follow-Ups/Clinician Recommendations: No  This is a list of the screening recommended for you and due dates:  Health Maintenance  Topic Date Due   Zoster (Shingles) Vaccine (1 of 2) Never done   Eye exam for diabetics  09/23/2018   COVID-19 Vaccine (5 - 2023-24 season) 07/24/2023   Cologuard (Stool DNA test)  01/03/2024   Hemoglobin A1C  01/14/2024   Yearly kidney health urinalysis for diabetes  02/26/2024   Yearly kidney function blood test for diabetes  07/18/2024   Complete foot exam   08/09/2024   Medicare Annual Wellness Visit  09/27/2024   DTaP/Tdap/Td vaccine (5 - Td or Tdap) 04/22/2032   Hepatitis C Screening  Completed   HPV Vaccine  Aged Out   Pneumonia Vaccine  Discontinued    Advanced directives: (Declined) Advance directive discussed with you today. Even though you declined this today, please call our office should you change your mind, and we can give you the proper paperwork for you to fill out.  Next Medicare Annual Wellness Visit scheduled for next year: Yes

## 2023-10-11 LAB — MICROALBUMIN, URINE: Microalb, Ur: 28.4

## 2023-11-09 ENCOUNTER — Encounter: Payer: Self-pay | Admitting: Podiatry

## 2023-11-09 ENCOUNTER — Ambulatory Visit (INDEPENDENT_AMBULATORY_CARE_PROVIDER_SITE_OTHER): Payer: Medicare HMO | Admitting: Podiatry

## 2023-11-09 DIAGNOSIS — M79675 Pain in left toe(s): Secondary | ICD-10-CM

## 2023-11-09 DIAGNOSIS — E1142 Type 2 diabetes mellitus with diabetic polyneuropathy: Secondary | ICD-10-CM | POA: Diagnosis not present

## 2023-11-09 DIAGNOSIS — M79674 Pain in right toe(s): Secondary | ICD-10-CM | POA: Diagnosis not present

## 2023-11-09 DIAGNOSIS — M722 Plantar fascial fibromatosis: Secondary | ICD-10-CM | POA: Insufficient documentation

## 2023-11-09 DIAGNOSIS — B351 Tinea unguium: Secondary | ICD-10-CM | POA: Diagnosis not present

## 2023-11-09 NOTE — Progress Notes (Signed)
This patient returns to my office for at risk foot care.  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  Patient is taking plavix.  Patient has history of CVA.  This patient is unable to cut nails himself since the patient cannot reach his nails.These nails are painful walking and wearing shoes. Pain through arch and heel left foot. This patient presents for at risk foot care today.  General Appearance  Alert, conversant and in no acute stress.  Vascular  Dorsalis pedis and posterior tibial  pulses are weakly  palpable  bilaterally.  Capillary return is within normal limits  Bilaterally.Cold feet left.  .  Absent digital hair  B/L.  Neurologic  Senn-Weinstein monofilament wire test diminished  bilaterally. Muscle power within normal limits bilaterally.  Nails Thick disfigured discolored nails with subungual debris  from hallux to fifth toes bilaterally. No evidence of bacterial infection or drainage bilaterally.  Orthopedic  No limitations of motion  feet .  No crepitus or effusions noted.  No bony pathology or digital deformities noted. Plantar fasciitis left foot.  Skin  normotropic skin with no porokeratosis noted bilaterally.  No signs of infections or ulcers noted.  Midfoot DJD left..  Hammer toes  B/L.   Onychomycosis  Pain in right toes  Pain in left toes  Neuropathic pain.  Consent was obtained for treatment procedures.   Mechanical debridement of nails 1-5  bilaterally performed with a nail nipper.  Filed with dremel without incident.     Return office visit   3 months                   Told patient to return for periodic foot care and evaluation due to potential at risk complications.   Helane Gunther DPM

## 2023-11-10 ENCOUNTER — Other Ambulatory Visit (HOSPITAL_BASED_OUTPATIENT_CLINIC_OR_DEPARTMENT_OTHER): Payer: Self-pay | Admitting: Family

## 2023-11-10 DIAGNOSIS — I10 Essential (primary) hypertension: Secondary | ICD-10-CM

## 2023-11-26 ENCOUNTER — Other Ambulatory Visit (HOSPITAL_BASED_OUTPATIENT_CLINIC_OR_DEPARTMENT_OTHER): Payer: Self-pay | Admitting: Cardiovascular Disease

## 2023-12-07 ENCOUNTER — Other Ambulatory Visit (HOSPITAL_BASED_OUTPATIENT_CLINIC_OR_DEPARTMENT_OTHER): Payer: Self-pay | Admitting: Cardiovascular Disease

## 2023-12-09 ENCOUNTER — Ambulatory Visit (INDEPENDENT_AMBULATORY_CARE_PROVIDER_SITE_OTHER): Payer: Medicare HMO | Admitting: Internal Medicine

## 2023-12-09 ENCOUNTER — Encounter: Payer: Self-pay | Admitting: Internal Medicine

## 2023-12-09 VITALS — BP 130/80 | HR 77 | Ht 71.0 in | Wt 221.0 lb

## 2023-12-09 DIAGNOSIS — Z794 Long term (current) use of insulin: Secondary | ICD-10-CM

## 2023-12-09 DIAGNOSIS — E1159 Type 2 diabetes mellitus with other circulatory complications: Secondary | ICD-10-CM | POA: Diagnosis not present

## 2023-12-09 DIAGNOSIS — E1165 Type 2 diabetes mellitus with hyperglycemia: Secondary | ICD-10-CM | POA: Diagnosis not present

## 2023-12-09 DIAGNOSIS — E7849 Other hyperlipidemia: Secondary | ICD-10-CM | POA: Diagnosis not present

## 2023-12-09 LAB — POCT GLYCOSYLATED HEMOGLOBIN (HGB A1C): Hemoglobin A1C: 7.5 % — AB (ref 4.0–5.6)

## 2023-12-09 MED ORDER — LANTUS SOLOSTAR 100 UNIT/ML ~~LOC~~ SOPN
10.0000 [IU] | PEN_INJECTOR | Freq: Every day | SUBCUTANEOUS | 3 refills | Status: DC
Start: 2023-12-09 — End: 2024-07-31

## 2023-12-09 MED ORDER — INSULIN LISPRO (1 UNIT DIAL) 100 UNIT/ML (KWIKPEN): 6.0000 [IU] | PEN_INJECTOR | Freq: Two times a day (BID) | SUBCUTANEOUS | 3 refills | Status: DC

## 2023-12-09 NOTE — Progress Notes (Signed)
Patient ID: [name], male   DOB: 04/13/1951, 73 y.o.   MRN: [id number]  HPI: [name] is a 73 y.o.-year-old male, returning for follow-up for DM2, dx in 2007, insulin-dependent since 2011, uncontrolled, with complications (CAD, stroke, CKD, DR, ED, PN). Pt. previously saw Dr. Everardo All, last visit with me 4 months ago.  Interim history: No increased urination, nausea, chest pain.  He continues to have blurry vision (cataracts).  Reviewed HbA1c: 10/11/2023 HbA1c 7.9% ([facility]) Lab Results  Component Value Date   HGBA1C 8.3 (H) 07/14/2023   HGBA1C 8.3 (A) 04/05/2023   HGBA1C 8.4 (A) 12/21/2022   HGBA1C 8.0 (A) 08/17/2022   HGBA1C 7.4 (A) 04/16/2022   HGBA1C 6.9 (A) 01/04/2022   HGBA1C 6.8 (A) 11/03/2021   HGBA1C 6.6 (A) 08/18/2021   HGBA1C 6.7 (A) 05/11/2021   HGBA1C 6.6 (A) 02/05/2021  04/22/2022: HbA1c 7.5%  Previously on: - Humalog 50/50 (prev. From PAP, now covered by the new insurance w/o a PA) 22 units after brunch >> moved to 15 minutes before brunch -  -stopped  in 12/2021 because feeling "weird, sweaty" He has significant tremors and cannot draw insulin from vials.  At last visit he was on: - Humalog 50/50 24 >> 18-20 >> 22 units 15 min before brunch He tried  [place] 10 mg before b'fast >> too $$$, did not qualify for PAP  We changed to: - Lantus 14 units at bedtime - Humalog 6-10 units 15 min before brunch and dinner  Pt checks his sugars >4x a day and they are:  Previously:  Previously:  Lowest sugar was 58 >> 67>> 53 >> 54; he has hypoglycemia awareness at 70.  Highest sugar was 290 >> 300s >> 300s >> upper 200s.  Glucometer: Accu-Chek  She usually has 2 meals: Brunch (11-am -2 pm) and dinner (6-8 pm).  - + CKD-sees nephrology, last BUN/creatinine:  10/11/2023: 29/2.22, GFR 31, glucose 122, ACR 28.4 Lab Results  Component Value Date   BUN 25 (H) 07/19/2023   BUN 19 07/16/2023   CREATININE 2.21 (H) 07/19/2023   CREATININE 1.87 (H)  07/16/2023   Lab Results  Component Value Date   MICRALBCREAT 85 02/26/2023   MICRALBCREAT 3.8 08/17/2016   MICRALBCREAT 3.2 08/07/2015   MICRALBCREAT 6.0 09/09/2014   MICRALBCREAT 2.4 01/30/2013  04/22/2022: ACR 33.7 On Cozaar 100 mg daily.  - + HL; last set of lipids: 10/11/2023 ([facility]):112/106/51.3/34 Lab Results  Component Value Date   CHOL 116 12/24/2022   HDL 60 12/24/2022   LDLCALC 38 12/24/2022   LDLDIRECT 63.0 01/12/2016   TRIG 95 12/24/2022   CHOLHDL 1.9 12/24/2022  On Lipitor 40 mg daily.  - last eye exam was in 11/2022 (Dr. Ashley Royalty): + DR reportedly. + Cataracts.   On ASA 81.  - no numbness and tingling in his feet.  Last foot exam 11/09/2023 by Dr. Stacie Acres.  [facility]est T[facility] was [facility] ([facility]): 10/11/2023 TSH 1.872 01/25/2023: TSH 2.61 04/22/2022: TSH 2.46  He was admitted with nausea/vomiting, CP - in 06/2023.  At that time, he was found to have cholelithiasis.  He had a slightly high lipase of 57 (11-51).    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   Chronic kidney disease  Constipation    takes Carafate four times day   DIAB W/UNSPEC COMP TYPE II/UNSPEC TYPE UNCNTRL    ERECTILE DYSFUNCTION    GERD    HYPERLIPIDEMIA-MIXED    HYPERTENSION, BENIGN    LBBB (left bundle branch block) 02/02/2016   MORTON'S NEUROMA, RIGHT    OSA on CPAP 09/09/2020   Peripheral neuropathy    Seasonal allergies    takes Allegra and Benadryl daily prn;uses Flonase daily   SHOULDER PAIN, RIGHT    Snoring 09/09/2020   Stroke, 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, s/p CIR    TIA on medication 02/2012   Unstable angina (HCC) 07/09/2020   Past Surgical History:  Procedure Laterality Date   ANGIOPLASTY     COLONOSCOPY     CORONARY ANGIOPLASTY  2005   2 stents    CORONARY STENT INTERVENTION N/A 07/10/2020   Procedure: CORONARY STENT INTERVENTION;  Surgeon: Swaziland, Peter M, MD;  Location: [facility] INVASIVE [facility];  Service: Cardiovascular;  Laterality: N/A;   DENTAL SURGERY     LARYNGOPLASTY  08/07/2012   Procedure: LARYNGOPLASTY;  Surgeon: Serena Colonel, MD;  Location: [facility] OR;  Service: ENT;  Laterality: Left;  Left Vocal Cord Medialyzation   LEFT HEART CATH AND CORONARY ANGIOGRAPHY N/A 07/10/2020   Procedure: LEFT HEART CATH AND CORONARY ANGIOGRAPHY;  Surgeon: Swaziland, Peter M, MD;  Location: [facility] INVASIVE CV LAB;  Service: Cardiovascular;  Laterality: N/A;   left knee surgury     x 2    stent  2001, 2004   coronary stents   Social History   Socioeconomic History   Marital status: Legally Separated    Spouse name: Not on file   Number of children: 3   Years of education: college   Highest education level: Not on file  Occupational History   Occupation: disabled    Employer: [employer]  Tobacco Use   Smoking status: Former    Current packs/day: 0.00    Average packs/day: 2.5 packs/day for 9.0 years (22.5 ttl pk-yrs)    Types: Cigarettes    Start date: 02/23/1974    Quit date: 02/24/1983    Years since quitting: 40.8   Smokeless tobacco: Never  Vaping Use   Vaping status: Never Used  Substance and Sexual Activity   Alcohol use: No    Alcohol/week: 0.0 standard drinks of alcohol   Drug use: No   Sexual activity: Never  Other Topics Concern   Not on file  Social History Narrative      Social Drivers of Health   Financial Resource Strain: Low Risk  (09/28/2023)   Overall Financial Resource Strain (CARDIA)    Difficulty of Paying Living Expenses: Not hard at all  Food Insecurity: No Food Insecurity (09/28/2023)   Hunger Vital Sign    Worried About Running Out of Food in the Last Year: Never true    Ran Out of Food in the Last Year: Never true  Transportation Needs: No Transportation Needs (09/28/2023)   PRAPARE - Scientist, research (physical sciences) (Medical): No    Lack of Transportation (Non-Medical): No  Physical Activity: Inactive (09/28/2023)   Exercise Vital Sign    Days of Exercise per Week: 0 days    Minutes of Exercise per Session: 0 min  Stress: No Stress Concern Present (09/28/2023)   [employer] of Occupational Health - Occupational Stress Questionnaire    Feeling of Stress : Not at all  Social Connections: Socially Isolated (09/28/2023)  Social Connection and Isolation Panel [NHANES]    Frequency of Communication with Friends and Family: Three times a week    Frequency of Social Gatherings with Friends and Family: Three times a week    Attends Religious Services: Never    Active Member of Clubs or Organizations: No    Attends Banker Meetings: Never    Marital Status: Separated  Intimate Partner Violence: Not At Risk (09/28/2023)   Humiliation, Afraid, Rape, and Kick questionnaire    Fear of Current or Ex-Partner: No    Emotionally Abused: No    Physically Abused: No    Sexually Abused: No   Current Outpatient Medications on File Prior to Visit  Medication Sig Dispense Refill   aspirin EC 81 MG tablet Take 1 tablet (81 mg total) by mouth daily. Swallow whole. 30 tablet 11   atorvastatin (LIPITOR) 40 MG tablet Take 1 tablet (40 mg total) by mouth daily. 90 tablet 3   BD PEN NEEDLE NANO 2ND GEN 32G X 4 MM MISC USE AS DIRECTED THREE TIMES DAILY 200 each 0   benzonatate (TESSALON) 100 MG capsule Take 1 capsule (100 mg total) by mouth every 8 (eight) hours. 21 capsule 0   bisacodyl (DULCOLAX) 10 MG suppository Place 1 suppository (10 mg total) rectally as needed for severe constipation. 5 suppository 0   Blood Pressure Monitoring (BLOOD PRESSURE CUFF) MISC 1 Units by Does not apply route daily. 1 each 0   calcium carbonate (TUMS - DOSED IN MG ELEMENTAL CALCIUM) 500 MG chewable tablet Chew 1 tablet by mouth daily as needed for indigestion or heartburn.     carvedilol (COREG) 25 MG tablet  TAKE 1 TABLET TWICE DAILY 180 tablet 3   chlorthalidone (HYGROTON) 25 MG tablet TAKE 1 TABLET EVERY DAY 90 tablet 3   clopidogrel (PLAVIX) 75 MG tablet TAKE 1 TABLET EVERY DAY 90 tablet 2   Continuous Glucose Sensor (FREESTYLE LIBRE 2 SENSOR) MISC 1 Device by Does not apply route every 14 (fourteen) days. 6 each 3   furosemide (LASIX) 40 MG tablet TAKE 1 TABLET (40 MG TOTAL) BY MOUTH DAILY. NEED APPT. 90 tablet 3   glucose blood (ACCU-CHEK AVIVA PLUS) test strip uad tid 100 each 12   insulin glargine (LANTUS SOLOSTAR) 100 UNIT/ML Solostar Pen Inject 14 Units into the skin daily. 15 mL 3   insulin lispro (HUMALOG KWIKPEN) 100 UNIT/ML KwikPen Inject 6-10 Units into the skin 2 (two) times daily before a meal. 15 mL 3   losartan (COZAAR) 100 MG tablet Take 1 tablet (100 mg total) by mouth daily. 90 tablet 3   magic mouthwash (lidocaine, diphenhydrAMINE, alum & mag hydroxide) suspension Swish and spit 5 mLs 3 (three) times daily as needed for mouth pain. 360 mL 0   magic mouthwash (lidocaine, diphenhydrAMINE, alum & mag hydroxide) suspension Swish and spit 5 mLs 3 (three) times daily. 360 mL 0   Microlet Lancets MISC UAD to check sugars TID.  E11.22 270 each 3   nitroGLYCERIN (NITROSTAT) 0.4 MG SL tablet Place 1 tablet (0.4 mg total) under the tongue every 5 (five) minutes as needed for chest pain (up to 3 doses). 25 tablet 3   pantoprazole (PROTONIX) 40 MG tablet Take 1 tablet (40 mg total) by mouth daily. 30 tablet 0   polyethylene glycol (MIRALAX) 17 g packet Take 17 g by mouth daily. 14 each 0   promethazine-dextromethorphan (PROMETHAZINE-DM) 6.25-15 MG/5ML syrup Take 2.5 mLs by mouth 4 (four) times daily  as needed for cough. 118 mL 0   senna-docusate (SENOKOT-S) 8.6-50 MG per tablet Take 2 tablets by mouth 2 (two) times daily. For constipation.     tiZANidine (ZANAFLEX) 2 MG tablet Take 1 tablet (2 mg total) by mouth at bedtime as needed for muscle spasms. 30 tablet 0   No current  facility-administered medications on file prior to visit.   Allergies  Allergen Reactions   Penicillins Anaphylaxis   Pneumococcal Vaccines Anaphylaxis   Shellfish Allergy Anaphylaxis   Barbiturates    Gabapentin     Urinary retention   Influenza Vaccines    Lisinopril Cough   Other Nausea And Vomiting    Pt wife states that dye that is used for eye exam in dr    Sildenafil Other (See Comments)    Other reaction(s): Headache, Dizziness   Topiramate Other (See Comments)    Chest spasms and numbness   Zofran [Ondansetron]    Amlodipine Other (See Comments)    LE edema   Levitra [Vardenafil] Other (See Comments)    headaches   Metformin Nausea And Vomiting   Tadalafil Other (See Comments)    dizziness   Family History  Problem Relation Age of Onset   Cancer Mother    Liver cancer Brother    Colon cancer Neg Hx    Esophageal cancer Neg Hx    PE: BP 130/80   Pulse 77   Ht 5\' 11"  (1.803 m)   Wt 221 lb (100.2 kg)   SpO2 99%   BMI 30.82 kg/m  Wt Readings from Last 3 Encounters:  12/09/23 221 lb (100.2 kg)  09/28/23 210 lb (95.3 kg)  08/30/23 207 lb (93.9 kg)   Constitutional: overweight, in NAD, walks with a cane Eyes: EOMI, no exophthalmos ENT: no thyromegaly, no cervical lymphadenopathy Cardiovascular: RRR, No MRG Respiratory: CTA B Musculoskeletal: no deformities Skin: no rashes Neurological: + tremor with outstretched hands, + L sided paresis  ASSESSMENT: 1. DM2, insulin-dependent, uncontrolled, with complications - CAD - s/p BMS in 2001, and DES in 2005 - stroke - CKD - DR - ED - PN - Hypoglycemia-last in 2020  2. HL  PLAN:  1. Patient with longstanding type 2 diabetes, insulin-dependent, on basal-bolus insulin regimen, changed from premixed insulin (Humalog 50/50) last year.  She was not able to use an SGLT2 inhibitor due to price and he did not qualify for patient assistance.  At last visit, sugars appears to be improved but still with hyperglycemia  after lunch and snack at night.  Upon questioning, he was not injecting the Humalog 15 minutes before meals, but only at the start of the meal or after.  We discussed about the importance of bolusing 15 minutes before the meal to give the insulin time to get into his system and prevent early postprandial hyperglycemia.  We also discussed about varying the dose of Humalog depending on the size and consistency of his meals.  I advised him to try to stop snacking at night or if he did, to move the snack closer to dinner to be able to be covered by the dinnertime Humalog.  I did advise him that if he continues to eat a snack late after dinner, he may need to hold his Humalog for this.  HbA1c at last visit was stable, at 8.3%, above target, but he had another HbA1c obtained 10/11/2023 at the [facility] and this was lower, at 7.9%. - of note, nephrology recently rec'd an SGLT2 inhibitor >> he declined  CGM interpretation: -At today's visit, we reviewed his CGM downloads: It appears that 78% of values are in target range (goal >70%), while 22% are higher than 180 (goal <25%), and 2% are lower than 70 (goal <4%).  The calculated average blood sugar is 142.  The projected HbA1c for the next 3 months ([facility]) is 6.7%. -Reviewing the CGM trends, sugars appear to be fluctuating mainly within the target range, but with higher occasional blood sugars after his 2 main meals of the day.  Sugars are dropping occasionally under 70 during the night and early morning so we discussed about reducing the dose of his Lantus slightly.  Sugars appear to increase after certain meals and upon questioning, it appears that these are meals for which she forgets to take the Humalog 15 minutes before, and has to take it either while eating or afterwards.  As a consequence, he may be dropping his blood sugars abruptly afterwards.  I advised him that if he needed to take the insulin after the meal due to forgetting to take it before, to take a lower amount  to avoid late postprandial hypoglycemia. - I suggested to:  Patient Instructions  Please decrease: - Lantus 12 units at bedtime (may need 10 units) - Humalog 6-10 units 15 min before brunch and dinner (if you inject after the meal, may need only 4-6 units)  Please return in 3-4 months.  - we checked his HbA1c: 7.5% (lower) -slightly higher than expected from his CGM - advised to check sugars at different times of the day - 4x a day, rotating check times - advised for yearly eye exams >> he is UTD - return to clinic in 3-4 months  2. HL - Reviewed latest lipid panel from 10/11/2023: All fractions at goal: 112/106/51.3/34 - Continues Lipitor 40 without side effects.  Carlus Pavlov, MD PhD [facility] Endocrinology

## 2023-12-09 NOTE — Patient Instructions (Addendum)
Please decrease: - Lantus 12 units at bedtime (may need 10 units) - Humalog 6-10 units 15 min before brunch and dinner (if you inject after the meal, may need only 4-6 units)  Please return in 3-4 months.

## 2023-12-14 ENCOUNTER — Other Ambulatory Visit: Payer: Self-pay

## 2023-12-14 ENCOUNTER — Emergency Department (HOSPITAL_COMMUNITY)
Admission: EM | Admit: 2023-12-14 | Discharge: 2023-12-15 | Disposition: A | Discharge status: Home / Self Care | Payer: Medicare HMO | Attending: Emergency Medicine | Admitting: Emergency Medicine

## 2023-12-14 ENCOUNTER — Encounter (HOSPITAL_COMMUNITY): Payer: Self-pay

## 2023-12-14 DIAGNOSIS — I129 Hypertensive chronic kidney disease with stage 1 through stage 4 chronic kidney disease, or unspecified chronic kidney disease: Secondary | ICD-10-CM | POA: Insufficient documentation

## 2023-12-14 DIAGNOSIS — N189 Chronic kidney disease, unspecified: Secondary | ICD-10-CM | POA: Insufficient documentation

## 2023-12-14 DIAGNOSIS — I2510 Atherosclerotic heart disease of native coronary artery without angina pectoris: Secondary | ICD-10-CM | POA: Diagnosis not present

## 2023-12-14 DIAGNOSIS — Z79899 Other long term (current) drug therapy: Secondary | ICD-10-CM | POA: Diagnosis not present

## 2023-12-14 DIAGNOSIS — D631 Anemia in chronic kidney disease: Secondary | ICD-10-CM | POA: Diagnosis not present

## 2023-12-14 DIAGNOSIS — I447 Left bundle-branch block, unspecified: Secondary | ICD-10-CM | POA: Insufficient documentation

## 2023-12-14 DIAGNOSIS — Z7982 Long term (current) use of aspirin: Secondary | ICD-10-CM | POA: Diagnosis not present

## 2023-12-14 DIAGNOSIS — D649 Anemia, unspecified: Secondary | ICD-10-CM | POA: Insufficient documentation

## 2023-12-14 DIAGNOSIS — E1122 Type 2 diabetes mellitus with diabetic chronic kidney disease: Secondary | ICD-10-CM | POA: Diagnosis not present

## 2023-12-14 DIAGNOSIS — I69354 Hemiplegia and hemiparesis following cerebral infarction affecting left non-dominant side: Secondary | ICD-10-CM | POA: Insufficient documentation

## 2023-12-14 DIAGNOSIS — R079 Chest pain, unspecified: Secondary | ICD-10-CM | POA: Diagnosis present

## 2023-12-14 DIAGNOSIS — Z20822 Contact with and (suspected) exposure to covid-19: Secondary | ICD-10-CM | POA: Diagnosis not present

## 2023-12-14 DIAGNOSIS — A09 Infectious gastroenteritis and colitis, unspecified: Secondary | ICD-10-CM | POA: Diagnosis not present

## 2023-12-14 DIAGNOSIS — R1110 Vomiting, unspecified: Secondary | ICD-10-CM | POA: Diagnosis present

## 2023-12-14 DIAGNOSIS — Z794 Long term (current) use of insulin: Secondary | ICD-10-CM | POA: Diagnosis not present

## 2023-12-14 DIAGNOSIS — E119 Type 2 diabetes mellitus without complications: Secondary | ICD-10-CM | POA: Insufficient documentation

## 2023-12-14 DIAGNOSIS — R9431 Abnormal electrocardiogram [ECG] [EKG]: Secondary | ICD-10-CM | POA: Insufficient documentation

## 2023-12-14 DIAGNOSIS — R55 Syncope and collapse: Secondary | ICD-10-CM | POA: Diagnosis present

## 2023-12-14 DIAGNOSIS — N289 Disorder of kidney and ureter, unspecified: Secondary | ICD-10-CM | POA: Insufficient documentation

## 2023-12-14 DIAGNOSIS — I10 Essential (primary) hypertension: Secondary | ICD-10-CM | POA: Insufficient documentation

## 2023-12-14 DIAGNOSIS — R197 Diarrhea, unspecified: Secondary | ICD-10-CM | POA: Insufficient documentation

## 2023-12-14 LAB — BASIC METABOLIC PANEL
Anion gap: 10 (ref 5–15)
BUN: 27 mg/dL — ABNORMAL HIGH (ref 8–23)
CO2: 24 mmol/L (ref 22–32)
Calcium: 9.4 mg/dL (ref 8.9–10.3)
Chloride: 106 mmol/L (ref 98–111)
Creatinine, Ser: 2.21 mg/dL — ABNORMAL HIGH (ref 0.61–1.24)
GFR, Estimated: 30 mL/min — ABNORMAL LOW (ref >60)
Glucose, Bld: 151 mg/dL — ABNORMAL HIGH (ref 70–99)
Potassium: 3.7 mmol/L (ref 3.5–5.1)
Sodium: 140 mmol/L (ref 135–145)

## 2023-12-14 LAB — RESP PANEL BY RT-PCR (RSV, FLU A&B, COVID)  RVPGX2
Influenza A by PCR: NEGATIVE
Influenza B by PCR: NEGATIVE
Resp Syncytial Virus by PCR: NEGATIVE
SARS Coronavirus 2 by RT PCR: NEGATIVE

## 2023-12-14 LAB — CBC
HCT: 35.3 % — ABNORMAL LOW (ref 39.0–52.0)
Hemoglobin: 11.4 g/dL — ABNORMAL LOW (ref 13.0–17.0)
MCH: 30.8 pg (ref 26.0–34.0)
MCHC: 32.3 g/dL (ref 30.0–36.0)
MCV: 95.4 fL (ref 80.0–100.0)
Platelets: 200 10*3/uL (ref 150–400)
RBC: 3.7 MIL/uL — ABNORMAL LOW (ref 4.22–5.81)
RDW: 12.8 % (ref 11.5–15.5)
WBC: 13.3 10*3/uL — ABNORMAL HIGH (ref 4.0–10.5)
nRBC: 0 % (ref 0.0–0.2)

## 2023-12-14 LAB — CBG MONITORING, ED: Glucose-Capillary: 141 mg/dL — ABNORMAL HIGH (ref 70–99)

## 2023-12-14 NOTE — ED Provider Triage Note (Signed)
Emergency Medicine Provider Triage Evaluation Note  [name] , a 73 y.o. male  was evaluated in triage.  Pt complains of near syncope.  Review of Systems  Positive: Lightheaded, diaphoretic Negative: Full syncope, vomiting, chest pain  Physical Exam  BP 118/69   Pulse 83   Temp 97.9 F (36.6 C) (Oral)   Resp 17   SpO2 99%  Gen:   Awake, no distress   Resp:  Normal effort  MSK:   Moves extremities without difficulty  Other:  RRR  Medical Decision Making  Medically screening exam initiated at 8:02 PM.  Appropriate orders placed.  [name] was informed that the remainder of the evaluation will be completed by another provider, this initial triage assessment does not replace that evaluation, and the importance of remaining in the ED until their evaluation is complete.  Became lightheaded, near sycopal after stepping out of the shower this evening. He sat down on the floor before he fell. He did not pass out. No chest pain. Feels better now but not back to baseline.  H/O CAD with stents, CVA   Elpidio Anis, Cordelia Poche 12/14/23 2004

## 2023-12-14 NOTE — ED Notes (Signed)
`   Needs another light green tube   lab has called because the tube sent was hemolized

## 2023-12-14 NOTE — ED Triage Notes (Addendum)
Pt arrives via EMS from home. Pt reports cough, bodyaches, fatigue, runny nose, and diarrhea for the past couple of days. Pt almost passed out today after taking a hot shower. Pt arrives AxOx4.

## 2023-12-15 DIAGNOSIS — R55 Syncope and collapse: Secondary | ICD-10-CM | POA: Insufficient documentation

## 2023-12-15 DIAGNOSIS — E860 Dehydration: Secondary | ICD-10-CM | POA: Insufficient documentation

## 2023-12-15 LAB — URINALYSIS, ROUTINE W REFLEX MICROSCOPIC
Bilirubin Urine: NEGATIVE
Glucose, UA: NEGATIVE mg/dL
Hgb urine dipstick: NEGATIVE
Ketones, ur: NEGATIVE mg/dL
Leukocytes,Ua: NEGATIVE
Nitrite: NEGATIVE
Protein, ur: 100 mg/dL — AB
Specific Gravity, Urine: 1.018 (ref 1.005–1.030)
pH: 5 (ref 5.0–8.0)

## 2023-12-15 MED ORDER — LOPERAMIDE HCL 2 MG PO CAPS
4.0000 mg | ORAL_CAPSULE | Freq: Once | ORAL | Status: AC
  Administered 2023-12-15: 4 mg via ORAL
  Filled 2023-12-15: qty 2

## 2023-12-15 NOTE — Discharge Instructions (Addendum)
Whenever you are feeling lightheaded, sit down or lie down immediately.  Be very careful when coming out of a hot shower as you will be more prone to getting dizzy at that time.  If you feel like you are having more diarrhea, take loperamide (Imodium A-D) as needed.  Just be careful not to take too much as it can cause constipation.

## 2023-12-15 NOTE — Patient Instructions (Addendum)
Medications changes include :   anti-bacterial eye drop for left eye      Return for follow up as scheduled.     Viral Illness, Adult Viruses are tiny germs that can get into a person's body and cause illness. There are many different types of viruses. And they cause many types of illness. Viral illnesses can range from mild to severe. They can affect various parts of the body. Short-term conditions that are caused by a virus include colds and flu (influenza) and stomach viruses. Long-term conditions that are caused by a virus include herpes, shingles, and human immunodeficiency virus (HIV) infection. A few viruses have been linked to certain cancers. What are the causes? Many types of viruses can cause illness. Viruses get into cells in your body, multiply, and cause the infected cells to work differently or die. When these cells die, they release more of the virus. When this happens, you get symptoms of the illness and the virus spreads to other cells. If the virus takes over how the cell works, it can cause the cell to divide and grow out of control. This happens when a virus causes cancer. Different viruses get into the body in different ways. You can get a virus by: Swallowing food or water that has come in contact with the virus. Breathing in droplets that have been coughed or sneezed into the air by an infected person. Touching a surface that has the virus on it and then touching your eyes, nose, or mouth. Being bitten by an insect or animal that carries the virus. Having sexual contact with a person who is infected with the virus. Being exposed to blood or fluids that contain the virus, either through an open cut or during a transfusion. If a virus enters your body, your body's disease-fighting system (immune system) will try to fight the virus. You may be at higher risk for a viral illness if your immune system is weak. What are the signs or symptoms? Symptoms depend on the  type of virus and the location of the cells that it gets into. Symptoms can include: For cold and flu viruses: Fever. Headache. Sore throat. Muscle aches. Stuffy nose (nasal congestion). Cough. For stomach (gastrointestinal) viruses: Fever. Pain in the abdomen. Nausea or vomiting. Diarrhea. For liver viruses (hepatitis): Loss of appetite. Feeling tired. Skin or the white parts of your eyes turning yellow (jaundice). For brain and spinal cord viruses: Fever. Headache. Stiff neck. Nausea and vomiting. Confusion or being sleepy. For skin viruses: Warts. Itching. Rash. For sexually transmitted viruses: Discharge. Swelling. Redness. Rash. How is this diagnosed? This condition may be diagnosed based on one or more of these: Your symptoms and medical history. A physical exam. Tests, such as: Blood tests. Tests on a sample of mucus from your lungs (sputum sample). Tests on a poop (stool) sample. Tests on a swab of body fluids or a skin sore (lesion). How is this treated? Viruses can be hard to treat because they live within cells. Antibiotics do not treat viruses because these medicines do not get inside cells. Treatment for a viral illness may include: Resting and drinking a lot of fluids. Medicines to treat symptoms. These can include over-the-counter medicine for pain and fever, medicines for cough or congestion, and medicines for diarrhea. Antiviral medicines. These medicines are available only for certain types of viruses. Some viral illnesses can be prevented with vaccinations. A common example is the flu shot. Follow these instructions at home: Medicines  Take over-the-counter and prescription medicines only as told by your health care provider. If you were prescribed an antiviral medicine, take it as told by your provider. Do not stop taking the antiviral even if you start to feel better. Know when antibiotics are needed and when they are not needed. Antibiotics do  not treat viruses. You may get an antibiotic if your provider thinks that you may have, or are at risk for, a bacterial infection and you have a viral infection. Do not ask for an antibiotic prescription if you have been diagnosed with a viral illness. Antibiotics will not make your illness go away faster. Taking antibiotics when they are not needed can lead to antibiotic resistance. When this develops, the medicine no longer works against the bacteria that it normally fights. General instructions Drink enough fluids to keep your pee (urine) pale yellow. Rest as much as possible. Return to your normal activities as told by your provider. Ask your provider what activities are safe for you. How is this prevented? To lower your risk of getting another viral illness: Wash your hands often with soap and water for at least 20 seconds. If soap and water are not available, use hand sanitizer. Avoid touching your nose, eyes, and mouth, especially if you have not washed your hands recently. If anyone in your household has a viral infection, clean all household surfaces that may have been in contact with the virus. Use soap and hot water. You may also use a commercially prepared, bleach-containing solution. Stay away from people who are sick with symptoms of a viral infection. Do not share items such as toothbrushes and water bottles with other people. Keep your vaccinations up to date. This includes getting a yearly flu shot. Eat a healthy diet and get plenty of rest. Contact a health care provider if: You have symptoms of a viral illness that do not go away. Your symptoms come back after going away. Your symptoms get worse. Get help right away if: You have trouble breathing. You have a severe headache or a stiff neck. You have severe vomiting or pain in your abdomen. These symptoms may be an emergency. Get help right away. Call 911. Do not wait to see if the symptoms will go away. Do not drive  yourself to the hospital. This information is not intended to replace advice given to you by your health care provider. Make sure you discuss any questions you have with your health care provider. Document Revised: 11/24/2022 Document Reviewed: 09/08/2022 Elsevier Patient Education  2024 [employer].

## 2023-12-15 NOTE — Progress Notes (Signed)
Subjective:    Patient ID: [name], male    DOB: 1951/10/15, 73 y.o.   MRN: [id number]     HPI [name] is here for follow up from the hospital  ED 12/14/23 for near syncope.  Presented to ED after near syncope at home.  Had 4 days of diarrhea w/ 3-4 watery BMs a day. He had a cough.  No fevers, nausea or vomiting.  He felt weak.  He took a hot shower that day and got out of the shower and felt like he might pass out.  He sat down.  No LOC.  No CP, palps, chest tightness.  In the ED he felt he was back at this baseline.  Resp panel neg.  GFR was slightly worse.  He was not orthostatic.  Near syncope thought to be related to dehydration.  He was given imodium.  He still feels weak. He has not had a BM since then.   He has a dry cough. Has cold symptoms.  The cold symptoms started before going to the emergency room.  His left eye has been puffy, had drainage and sore for about 1 week.  He does have an appointment with his eye doctor, but not until next week.  He denies any eye pain or changes in vision.  He is still having some mucus.  He is eating a little.  Trying to drink a lot of fluids.    Medications and allergies reviewed with patient and updated if appropriate.  Current Outpatient Medications on File Prior to Visit  Medication Sig Dispense Refill   aspirin EC 81 MG tablet Take 1 tablet (81 mg total) by mouth daily. Swallow whole. 30 tablet 11   atorvastatin (LIPITOR) 40 MG tablet Take 1 tablet (40 mg total) by mouth daily. 90 tablet 3   BD PEN NEEDLE NANO 2ND GEN 32G X 4 MM MISC USE AS DIRECTED THREE TIMES DAILY 200 each 0   bisacodyl (DULCOLAX) 10 MG suppository Place 1 suppository (10 mg total) rectally as needed for severe constipation. 5 suppository 0   Blood Pressure Monitoring (BLOOD PRESSURE CUFF) MISC 1 Units by Does not apply route daily. 1 each 0   calcium carbonate (TUMS - DOSED IN MG ELEMENTAL CALCIUM) 500 MG chewable tablet Chew 1 tablet by mouth  daily as needed for indigestion or heartburn.     carvedilol (COREG) 25 MG tablet TAKE 1 TABLET TWICE DAILY 180 tablet 3   chlorthalidone (HYGROTON) 25 MG tablet TAKE 1 TABLET EVERY DAY 90 tablet 3   clopidogrel (PLAVIX) 75 MG tablet TAKE 1 TABLET EVERY DAY 90 tablet 2   Continuous Glucose Sensor (FREESTYLE LIBRE 2 SENSOR) MISC 1 Device by Does not apply route every 14 (fourteen) days. 6 each 3   furosemide (LASIX) 40 MG tablet TAKE 1 TABLET (40 MG TOTAL) BY MOUTH DAILY. NEED APPT. 90 tablet 3   glucose blood (ACCU-CHEK AVIVA PLUS) test strip uad tid 100 each 12   insulin glargine (LANTUS SOLOSTAR) 100 UNIT/ML Solostar Pen Inject 10-14 Units into the skin daily. 15 mL 3   insulin lispro (HUMALOG KWIKPEN) 100 UNIT/ML KwikPen Inject 6-10 Units into the skin 2 (two) times daily before a meal. 30 mL 3   losartan (COZAAR) 100 MG tablet Take 1 tablet (100 mg total) by mouth daily. 90 tablet 3   magic mouthwash (lidocaine, diphenhydrAMINE, alum & mag hydroxide) suspension Swish and spit 5 mLs 3 (three) times daily as needed for  mouth pain. 360 mL 0   Microlet Lancets MISC UAD to check sugars TID.  E11.22 270 each 3   nitroGLYCERIN (NITROSTAT) 0.4 MG SL tablet Place 1 tablet (0.4 mg total) under the tongue every 5 (five) minutes as needed for chest pain (up to 3 doses). 25 tablet 3   polyethylene glycol (MIRALAX) 17 g packet Take 17 g by mouth daily. 14 each 0   senna-docusate (SENOKOT-S) 8.6-50 MG per tablet Take 2 tablets by mouth 2 (two) times daily. For constipation.     tiZANidine (ZANAFLEX) 2 MG tablet Take 1 tablet (2 mg total) by mouth at bedtime as needed for muscle spasms. 30 tablet 0   pantoprazole (PROTONIX) 40 MG tablet Take 1 tablet (40 mg total) by mouth daily. 30 tablet 0   No current facility-administered medications on file prior to visit.     Review of Systems  Constitutional:  Positive for fatigue and fever (? low grade the day he went to the hospital).  HENT:  Positive for  congestion, rhinorrhea, sinus pressure, sore throat and voice change. Negative for ear pain (not now - in the beginning).   Respiratory:  Positive for cough (dry) and shortness of breath (if he walks too far - new). Negative for chest tightness and wheezing.   Cardiovascular:  Negative for chest pain.  Gastrointestinal:  Positive for diarrhea (resolved with diarrhea) and nausea (sometimes). Negative for abdominal pain.  Musculoskeletal:  Positive for myalgias.  Neurological:  Positive for light-headedness and headaches. Negative for dizziness.       Objective:   Vitals:   12/16/23 0837  BP: 132/62  Pulse: 72  Temp: 98.7 F (37.1 C)  SpO2: 99%   BP Readings from Last 3 Encounters:  12/16/23 132/62  12/15/23 127/72  12/09/23 130/80   Wt Readings from Last 3 Encounters:  12/16/23 215 lb 3.2 oz (97.6 kg)  12/09/23 221 lb (100.2 kg)  09/28/23 210 lb (95.3 kg)   Body mass index is 30.01 kg/m.    Physical Exam Constitutional:      General: He is not in acute distress.    Appearance: Normal appearance. He is not ill-appearing.  HENT:     Head: Normocephalic.     Right Ear: Tympanic membrane, ear canal and external ear normal. There is no impacted cerumen.     Left Ear: Tympanic membrane, ear canal and external ear normal. There is no impacted cerumen.     Mouth/Throat:     Mouth: Mucous membranes are moist.     Pharynx: No oropharyngeal exudate or posterior oropharyngeal erythema.  Eyes:     Conjunctiva/sclera: Conjunctivae normal.  Cardiovascular:     Rate and Rhythm: Normal rate and regular rhythm.  Pulmonary:     Effort: Pulmonary effort is normal. No respiratory distress.     Breath sounds: Normal breath sounds. No wheezing or rales.  Musculoskeletal:     Cervical back: Neck supple. No tenderness.     Right lower leg: No edema.     Left lower leg: No edema.  Lymphadenopathy:     Cervical: No cervical adenopathy.  Skin:    General: Skin is warm and dry.      Findings: No rash.  Neurological:     Mental Status: He is alert.        Lab Results  Component Value Date   WBC 13.3 (H) 12/14/2023   HGB 11.4 (L) 12/14/2023   HCT 35.3 (L) 12/14/2023   PLT 200 12/14/2023  GLUCOSE 151 (H) 12/14/2023   CHOL 116 12/24/2022   TRIG 95 12/24/2022   HDL 60 12/24/2022   LDLDIRECT 63.0 01/12/2016   LDLCALC 38 12/24/2022   ALT 31 07/19/2023   AST 23 07/19/2023   NA 140 12/14/2023   K 3.7 12/14/2023   CL 106 12/14/2023   CREATININE 2.21 (H) 12/14/2023   BUN 27 (H) 12/14/2023   CO2 24 12/14/2023   TSH 1.973 07/14/2023   PSA 0.14 08/17/2016   INR 1.2 07/15/2023   HGBA1C 7.5 (A) 12/09/2023   MICROALBUR 28.4 10/11/2023     Assessment & Plan:    See Problem List for Assessment and Plan of chronic medical problems.

## 2023-12-15 NOTE — ED Provider Notes (Signed)
[place] EMERGENCY DEPARTMENT AT [facility] Provider Note   CSN: [id number] Arrival date & time: 12/14/23  1901     History  Chief Complaint  Patient presents with   Near Syncope    [name] is a 73 y.o. male.  The history is provided by the patient.  Near Syncope  He has history of hypertension, diabetes, hyperlipidemia, stroke with left hemiparesis, coronary artery disease, chronic kidney disease and comes in after an episode of near syncope at home.  For the last 4 days, he has been having diarrhea with 3-4 watery bowel movements a day.  He denies fever, chills, sweats.  Denies any nausea or vomiting.  He has felt weak and washed out with this.  He took a hot shower today and as he got out of the shower, he felt like he might pass out.  He sat down and did not actually pass out.  Denies chest pain, heaviness, tightness, pressure.  He feels that he is back to his baseline now.   Home Medications Prior to Admission medications   Medication Sig Start Date End Date Taking? Authorizing Provider  aspirin EC 81 MG tablet Take 1 tablet (81 mg total) by mouth daily. Swallow whole. 07/10/20   Dunn, Tacey Ruiz, PA-C  atorvastatin (LIPITOR) 40 MG tablet Take 1 tablet (40 mg total) by mouth daily. 05/03/23   Alver Sorrow, NP  BD PEN NEEDLE NANO 2ND GEN 32G X 4 MM MISC USE AS DIRECTED THREE TIMES DAILY 09/22/23   Carlus Pavlov, MD  bisacodyl (DULCOLAX) 10 MG suppository Place 1 suppository (10 mg total) rectally as needed for severe constipation. 07/19/23   Pincus Sanes, MD  Blood Pressure Monitoring (BLOOD PRESSURE CUFF) MISC 1 Units by Does not apply route daily. 02/03/21   Chilton Si, MD  calcium carbonate (TUMS - DOSED IN MG ELEMENTAL CALCIUM) 500 MG chewable tablet Chew 1 tablet by mouth daily as needed for indigestion or heartburn.    [provider]  carvedilol (COREG) 25 MG tablet TAKE 1 TABLET TWICE DAILY 11/10/23   Chilton Si, MD   chlorthalidone (HYGROTON) 25 MG tablet TAKE 1 TABLET EVERY DAY 11/28/23   Chilton Si, MD  clopidogrel (PLAVIX) 75 MG tablet TAKE 1 TABLET EVERY DAY 12/07/23   Chilton Si, MD  Continuous Glucose Sensor (FREESTYLE LIBRE 2 SENSOR) MISC 1 Device by Does not apply route every 14 (fourteen) days. 08/09/23   Carlus Pavlov, MD  furosemide (LASIX) 40 MG tablet TAKE 1 TABLET (40 MG TOTAL) BY MOUTH DAILY. NEED APPT. 09/16/21   Chilton Si, MD  glucose blood (ACCU-CHEK AVIVA PLUS) test strip uad tid 12/14/21   Pincus Sanes, MD  insulin glargine (LANTUS SOLOSTAR) 100 UNIT/ML Solostar Pen Inject 10-14 Units into the skin daily. 12/09/23   Carlus Pavlov, MD  insulin lispro (HUMALOG KWIKPEN) 100 UNIT/ML KwikPen Inject 6-10 Units into the skin 2 (two) times daily before a meal. 12/09/23   Carlus Pavlov, MD  losartan (COZAAR) 100 MG tablet Take 1 tablet (100 mg total) by mouth daily. 04/04/23   Alver Sorrow, NP  magic mouthwash (lidocaine, diphenhydrAMINE, alum & mag hydroxide) suspension Swish and spit 5 mLs 3 (three) times daily as needed for mouth pain. 08/26/23   Vallery Sa, Amy L, PA  Microlet Lancets MISC UAD to check sugars TID.  E11.22 12/14/21   Pincus Sanes, MD  nitroGLYCERIN (NITROSTAT) 0.4 MG SL tablet Place 1 tablet (0.4 mg total) under the tongue every  5 (five) minutes as needed for chest pain (up to 3 doses). 12/23/22 12/23/23  Chilton Si, MD  pantoprazole (PROTONIX) 40 MG tablet Take 1 tablet (40 mg total) by mouth daily. 07/16/23 08/15/23  Lanae Boast, MD  polyethylene glycol (MIRALAX) 17 g packet Take 17 g by mouth daily. 07/16/23   Lanae Boast, MD  senna-docusate (SENOKOT-S) 8.6-50 MG per tablet Take 2 tablets by mouth 2 (two) times daily. For constipation. 09/07/12   Love, Evlyn Kanner, PA-C  tiZANidine (ZANAFLEX) 2 MG tablet Take 1 tablet (2 mg total) by mouth at bedtime as needed for muscle spasms. 03/04/23   Pincus Sanes, MD      Allergies    Penicillins, Pneumococcal  vaccines, Shellfish allergy, Barbiturates, Gabapentin, Influenza vaccines, Lisinopril, Other, Sildenafil, Topiramate, Zofran [ondansetron], Amlodipine, Levitra [vardenafil], Metformin, and Tadalafil    Review of Systems   Review of Systems  Cardiovascular:  Positive for near-syncope.  All other systems reviewed and are negative.   Physical Exam Updated Vital Signs BP (!) 140/118 (BP Location: Right Arm)   Pulse 76   Temp 98.3 F (36.8 C) (Oral)   Resp (!) 21   SpO2 99%  Physical Exam Vitals and nursing note reviewed.   73 year old male, resting comfortably and in no acute distress. Vital signs are significant for elevated blood pressure. Oxygen saturation is 99%, which is normal. Head is normocephalic and atraumatic. PERRLA, EOMI. Oropharynx is clear. Neck is nontender and supple without adenopathy or JVD. Lungs are clear without rales, wheezes, or rhonchi. Chest is nontender. Heart has regular rate and rhythm without murmur. Abdomen is soft, flat, nontender. Skin is warm and dry without rash. Neurologic: Mental status is normal, left hemiparesis present.  ED Results / Procedures / Treatments   Labs (all labs ordered are listed, but only abnormal results are displayed) Labs Reviewed  CBC - Abnormal; Notable for the following components:      Result Value   WBC 13.3 (*)    RBC 3.70 (*)    Hemoglobin 11.4 (*)    HCT 35.3 (*)    All other components within normal limits  BASIC METABOLIC PANEL - Abnormal; Notable for the following components:   Glucose, Bld 151 (*)    BUN 27 (*)    Creatinine, Ser 2.21 (*)    GFR, Estimated 30 (*)    All other components within normal limits  CBG MONITORING, ED - Abnormal; Notable for the following components:   Glucose-Capillary 141 (*)    All other components within normal limits  RESP PANEL BY RT-PCR (RSV, FLU A&B, COVID)  RVPGX2  URINALYSIS, ROUTINE W REFLEX MICROSCOPIC    EKG EKG Interpretation Date/Time:  Wednesday December 14 2023 20:32:42 EST Ventricular Rate:  65 PR Interval:  148 QRS Duration:  148 QT Interval:  430 QTC Calculation: 447 R Axis:   91  Text Interpretation: Normal sinus rhythm Rightward axis Left bundle branch block Abnormal ECG When compared with ECG of 14-Jul-2023 12:19, No significant change was found Reconfirmed by Dione Booze ([id number]) on 12/15/2023 12:23:56 AM  Radiology No results found.  Procedures Procedures  Cardiac monitor shows normal sinus rhythm, per my interpretation.  Medications Ordered in ED Medications - No data to display  ED Course/ Medical Decision Making/ A&P                                 Medical Decision Making  Amount and/or Complexity of Data Reviewed Labs: ordered.  Risk Prescription drug management.   Near syncope which seems most likely to be from relative dehydration from diarrhea for the last several days.  I have low index of suspicion for ACS, arrhythmia.  Patient does not appear overtly dehydrated on exam and vital signs are normal.  I have reviewed his electrocardiogram, and my interpretation is left bundle branch block not significantly changed from prior.  I have reviewed his laboratory test, and my interpretation is stable renal insufficiency, mild leukocytosis which is nonspecific, mild anemia with hemoglobin actually somewhat higher than recent values suggesting possible dehydration.  I have reviewed his past records, and note echocardiogram on 02/15/2023 showing ejection fraction 55% and grade 1 diastolic dysfunction.  I noted the nuclear stress test on 01/05/2023 with a fixed perfusion defect consistent with old infarct but no evidence of ischemia on ECG or nuclear imaging.  I have ordered orthostatic vital signs to be checked.  If he has no significant changes in orthostatic vital signs, and is asymptomatic when standing, I feel he can safely be discharged.  Orthostatic vital signs showed no significant change in pulse or blood pressure.  He was  asymptomatic on standing.  I am discharging him with instructions to use over-the-counter loperamide as needed, also I have encouraged him to make sure he maintains adequate hydration.  Return precautions discussed.  Final Clinical Impression(s) / ED Diagnoses Final diagnoses:  Near syncope  Diarrhea of presumed infectious origin  Renal insufficiency  Normochromic normocytic anemia    Rx / DC Orders ED Discharge Orders     None         Dione Booze, MD 12/15/23 [phone]

## 2023-12-16 ENCOUNTER — Ambulatory Visit (INDEPENDENT_AMBULATORY_CARE_PROVIDER_SITE_OTHER): Payer: Medicare HMO | Admitting: Internal Medicine

## 2023-12-16 ENCOUNTER — Encounter: Payer: Self-pay | Admitting: Internal Medicine

## 2023-12-16 VITALS — BP 132/62 | HR 72 | Temp 98.7°F | Ht 71.0 in | Wt 215.2 lb

## 2023-12-16 DIAGNOSIS — N1832 Chronic kidney disease, stage 3b: Secondary | ICD-10-CM

## 2023-12-16 DIAGNOSIS — R55 Syncope and collapse: Secondary | ICD-10-CM

## 2023-12-16 DIAGNOSIS — I10 Essential (primary) hypertension: Secondary | ICD-10-CM

## 2023-12-16 DIAGNOSIS — E860 Dehydration: Secondary | ICD-10-CM

## 2023-12-16 DIAGNOSIS — H109 Unspecified conjunctivitis: Secondary | ICD-10-CM | POA: Insufficient documentation

## 2023-12-16 DIAGNOSIS — H1032 Unspecified acute conjunctivitis, left eye: Secondary | ICD-10-CM

## 2023-12-16 MED ORDER — POLYMYXIN B-TRIMETHOPRIM 10000-0.1 UNIT/ML-% OP SOLN: 1.0000 [drp] | OPHTHALMIC | 0 refills | Status: DC

## 2023-12-16 NOTE — Assessment & Plan Note (Signed)
Acute Left eye has been erythematous, sore, slightly puffy and has had continuous discharge Since symptoms have been going on for a week concerned with bacterial cause-will start Polytrim drops Has an appointment with his ophthalmologist next week

## 2023-12-16 NOTE — Assessment & Plan Note (Signed)
Chronic Blood pressure controlled Had near syncope recently with ED visit-likely related to dehydration.  He was not orthostatic in the emergency room Continue losartan 100 mg daily, chlorthalidone 25 mg daily, Coreg 25 mg twice daily

## 2023-12-16 NOTE — Assessment & Plan Note (Addendum)
Chronic Following with nephrology Stable May have had some mild dehydration a few days ago in setting of diarrhea for several days, viral illness Diarrhea has resolved  -secondary to Imodium from the ED He is trying to make sure he is drinking plenty of fluids

## 2023-12-16 NOTE — Assessment & Plan Note (Addendum)
Went to the ED 12/14/2023 with near syncope Occurred in the setting of likely dehydration-4 days of diarrhea with 3-4 watery BMs a day, generalized weakness-likely related to viral illness He had just taken a hot shower and when he got out felt lightheaded like he might pass out Not orthostatic Likely mild dehydration Symptoms improved since ED He is still getting over the viral illness-stressed good hydration and symptomatic treatment

## 2023-12-16 NOTE — Assessment & Plan Note (Addendum)
Went to ED 12/14/2023 for near syncope likely related to dehydration Dehydration in setting of 4 days of diarrhea with 3-4 watery BMs a day Diarrhea has resolved with Imodium-bowel movement should return to normal on their own He is drinking plenty of fluids and will continue to do so

## 2023-12-19 ENCOUNTER — Telehealth: Payer: Self-pay

## 2023-12-19 NOTE — Telephone Encounter (Signed)
Copied from CRM [id number]. Topic: Referral - Request for Referral >> Dec 19, 2023 10:48 AM Elizebeth Brooking wrote: Did the patient discuss referral with their provider in the last year? Yes (If No - schedule appointment) (If Yes - send message)  Appointment offered? Yes  Type of order/referral and detailed reason for visit: Ear Nose Throat   Preference of office, provider, location: No Preference   If referral order, have you been seen by this specialty before? No (If Yes, this issue or another issue? When? Where?  Can we respond through MyChart? Yes

## 2023-12-21 ENCOUNTER — Encounter (INDEPENDENT_AMBULATORY_CARE_PROVIDER_SITE_OTHER): Payer: Medicare HMO | Admitting: Ophthalmology

## 2023-12-21 DIAGNOSIS — Z794 Long term (current) use of insulin: Secondary | ICD-10-CM | POA: Diagnosis not present

## 2023-12-21 DIAGNOSIS — H35033 Hypertensive retinopathy, bilateral: Secondary | ICD-10-CM | POA: Diagnosis not present

## 2023-12-21 DIAGNOSIS — E113293 Type 2 diabetes mellitus with mild nonproliferative diabetic retinopathy without macular edema, bilateral: Secondary | ICD-10-CM | POA: Diagnosis not present

## 2023-12-21 DIAGNOSIS — H35373 Puckering of macula, bilateral: Secondary | ICD-10-CM

## 2023-12-21 DIAGNOSIS — I10 Essential (primary) hypertension: Secondary | ICD-10-CM | POA: Diagnosis not present

## 2023-12-21 DIAGNOSIS — H43813 Vitreous degeneration, bilateral: Secondary | ICD-10-CM

## 2023-12-22 NOTE — Telephone Encounter (Signed)
Left message for patient and also sent my-chart message for follow up

## 2024-01-17 NOTE — Progress Notes (Unsigned)
 Subjective:    Patient ID: [name], male    DOB: 02-10-1951, 73 y.o.   MRN: [id number]     HPI [name] is here for a physical exam and his chronic medical problems.   Soreness from right forehead, face and into neck.  He does get a sore throat with these symptoms.  Occ saliva goes down the wrong way and he starts choking.    Rash - started 6 months ago.  On right upper arm and back.  Initially look like black heads, pus - itch - he scratches them  -they do become open wounds and then eventually heal and leaves marks   Medications and allergies reviewed with patient and updated if appropriate.  Current Outpatient Medications on File Prior to Visit  Medication Sig Dispense Refill   aspirin EC 81 MG tablet Take 1 tablet (81 mg total) by mouth daily. Swallow whole. 30 tablet 11   atorvastatin (LIPITOR) 40 MG tablet Take 1 tablet (40 mg total) by mouth daily. 90 tablet 3   BD PEN NEEDLE NANO 2ND GEN 32G X 4 MM MISC USE AS DIRECTED THREE TIMES DAILY 200 each 0   bisacodyl (DULCOLAX) 10 MG suppository Place 1 suppository (10 mg total) rectally as needed for severe constipation. 5 suppository 0   Blood Pressure Monitoring (BLOOD PRESSURE CUFF) MISC 1 Units by Does not apply route daily. 1 each 0   calcium carbonate (TUMS - DOSED IN MG ELEMENTAL CALCIUM) 500 MG chewable tablet Chew 1 tablet by mouth daily as needed for indigestion or heartburn.     carvedilol (COREG) 25 MG tablet TAKE 1 TABLET TWICE DAILY 180 tablet 3   chlorthalidone (HYGROTON) 25 MG tablet TAKE 1 TABLET EVERY DAY 90 tablet 3   clopidogrel (PLAVIX) 75 MG tablet TAKE 1 TABLET EVERY DAY 90 tablet 2   Continuous Glucose Sensor (FREESTYLE LIBRE 2 SENSOR) MISC 1 Device by Does not apply route every 14 (fourteen) days. 6 each 3   furosemide (LASIX) 40 MG tablet TAKE 1 TABLET (40 MG TOTAL) BY MOUTH DAILY. NEED APPT. 90 tablet 3   glucose blood (ACCU-CHEK AVIVA PLUS) test strip uad tid 100 each 12   insulin  glargine (LANTUS SOLOSTAR) 100 UNIT/ML Solostar Pen Inject 10-14 Units into the skin daily. 15 mL 3   insulin lispro (HUMALOG KWIKPEN) 100 UNIT/ML KwikPen Inject 6-10 Units into the skin 2 (two) times daily before a meal. 30 mL 3   losartan (COZAAR) 100 MG tablet Take 1 tablet (100 mg total) by mouth daily. 90 tablet 3   Microlet Lancets MISC UAD to check sugars TID.  E11.22 270 each 3   senna-docusate (SENOKOT-S) 8.6-50 MG per tablet Take 2 tablets by mouth 2 (two) times daily. For constipation.     tiZANidine (ZANAFLEX) 2 MG tablet Take 1 tablet (2 mg total) by mouth at bedtime as needed for muscle spasms. 30 tablet 0   nitroGLYCERIN (NITROSTAT) 0.4 MG SL tablet Place 1 tablet (0.4 mg total) under the tongue every 5 (five) minutes as needed for chest pain (up to 3 doses). 25 tablet 3   pantoprazole (PROTONIX) 40 MG tablet Take 1 tablet (40 mg total) by mouth daily. 30 tablet 0   polyethylene glycol (MIRALAX) 17 g packet Take 17 g by mouth daily. (Patient not taking: Reported on 01/18/2024) 14 each 0   trimethoprim-polymyxin b (POLYTRIM) ophthalmic solution Place 1 drop into the left eye every 4 (four) hours. Use for 7 days (Patient  not taking: Reported on 01/18/2024) 10 mL 0   No current facility-administered medications on file prior to visit.    Review of Systems  Constitutional:  Positive for diaphoresis (with activity or rest) and fatigue. Negative for fever.  HENT:  Positive for congestion (thick), sinus pressure and sore throat (occ). Negative for ear pain.   Eyes:  Positive for visual disturbance (from cataracts).  Respiratory:  Positive for cough (occ brings up mucus), shortness of breath (only with moderate exertion) and wheezing (occ).   Cardiovascular:  Positive for leg swelling. Negative for chest pain and palpitations.  Gastrointestinal:  Positive for abdominal pain (intermittent left side - needle stick pain - transient) and diarrhea (taking 4 dulcolax daily). Negative for blood in  stool and constipation.       No gerd  Genitourinary:  Negative for difficulty urinating.  Musculoskeletal:  Positive for arthralgias (diffuse) and back pain (left lower back).  Skin:  Positive for rash (right upper arm and back - itchy).  Neurological:  Positive for light-headedness (occ) and headaches (occ).  Psychiatric/Behavioral:  Negative for dysphoric mood and sleep disturbance. The patient is not nervous/anxious.        Objective:   Vitals:   01/18/24 0955  BP: 134/76  Pulse: 72  Temp: (!) 97.5 F (36.4 C)  SpO2: 97%   Filed Weights   01/18/24 0955  Weight: 216 lb 6.4 oz (98.2 kg)   Body mass index is 30.18 kg/m.  BP Readings from Last 3 Encounters:  01/18/24 134/76  12/16/23 132/62  12/15/23 127/72    Wt Readings from Last 3 Encounters:  01/18/24 216 lb 6.4 oz (98.2 kg)  12/16/23 215 lb 3.2 oz (97.6 kg)  12/09/23 221 lb (100.2 kg)      Physical Exam Constitutional: He appears well-developed and well-nourished. No distress.  HENT:  Head: Normocephalic and atraumatic.  Right Ear: External ear normal.  Left Ear: External ear normal.  Normal ear canals and TM b/l  Mouth/Throat: Oropharynx is clear and moist. Eyes: Conjunctivae and EOM are normal.  Neck: Neck supple. No tracheal deviation present. No thyromegaly present.  No carotid bruit  Cardiovascular: Normal rate, regular rhythm, normal heart sounds and intact distal pulses.   No murmur heard.  No lower extremity edema. Pulmonary/Chest: Effort normal and breath sounds normal. No respiratory distress. He has no wheezes. He has no rales.  Abdominal: Soft. He exhibits no distension. There is no tenderness.  Genitourinary: deferred  Lymphadenopathy:   He has no cervical adenopathy.  Skin: Skin is warm and dry. He is not diaphoretic.  Rash on right upper arm-areas of hyperpigmentation from previous lesions and new lesions that look like scabbed ulcerations from where he scratched Psychiatric: He has a  normal mood and affect. His behavior is normal.         Assessment & Plan:   Physical exam: Screening blood work  ordered Exercise  none    Weight  obese - encouraged weight loss Substance abuse   none   Reviewed recommended immunizations.   Health Maintenance  Topic Date Due   OPHTHALMOLOGY EXAM  09/23/2018   Fecal DNA (Cologuard)  01/03/2024   COVID-19 Vaccine (5 - 2024-25 season) 02/03/2024 (Originally 07/24/2023)   Zoster Vaccines- Shingrix (1 of 2) 04/16/2024 (Originally 11/11/1999)   Diabetic kidney evaluation - Urine ACR  02/26/2024   HEMOGLOBIN A1C  06/07/2024   Medicare Annual Wellness (AWV)  09/27/2024   FOOT EXAM  11/08/2024   Diabetic kidney evaluation -  eGFR measurement  12/13/2024   DTaP/Tdap/Td (5 - Td or Tdap) 04/22/2032   Hepatitis C Screening  Completed   HPV VACCINES  Aged Out   Pneumonia Vaccine 77+ Years old  Discontinued     See Problem List for Assessment and Plan of chronic medical problems.

## 2024-01-17 NOTE — Patient Instructions (Addendum)
 Blood work was ordered.       Medications changes include :   steroid cream for rash, doxcycyline      Return in about 1 year (around 01/17/2025) for Physical Exam.   Health Maintenance, Male Adopting a healthy lifestyle and getting preventive care are important in promoting health and wellness. Ask your health care provider about: The right schedule for you to have regular tests and exams. Things you can do on your own to prevent diseases and keep yourself healthy. What should I know about diet, weight, and exercise? Eat a healthy diet  Eat a diet that includes plenty of vegetables, fruits, low-fat dairy products, and lean protein. Do not eat a lot of foods that are high in solid fats, added sugars, or sodium. Maintain a healthy weight Body mass index (BMI) is a measurement that can be used to identify possible weight problems. It estimates body fat based on height and weight. Your health care provider can help determine your BMI and help you achieve or maintain a healthy weight. Get regular exercise Get regular exercise. This is one of the most important things you can do for your health. Most adults should: Exercise for at least 150 minutes each week. The exercise should increase your heart rate and make you sweat (moderate-intensity exercise). Do strengthening exercises at least twice a week. This is in addition to the moderate-intensity exercise. Spend less time sitting. Even light physical activity can be beneficial. Watch cholesterol and blood lipids Have your blood tested for lipids and cholesterol at 75 years of age, then have this test every 5 years. You may need to have your cholesterol levels checked more often if: Your lipid or cholesterol levels are high. You are older than 75 years of age. You are at high risk for heart disease. What should I know about cancer screening? Many types of cancers can be detected early and may often be prevented. Depending on  your health history and family history, you may need to have cancer screening at various ages. This may include screening for: Colorectal cancer. Prostate cancer. Skin cancer. Lung cancer. What should I know about heart disease, diabetes, and high blood pressure? Blood pressure and heart disease High blood pressure causes heart disease and increases the risk of stroke. This is more likely to develop in people who have high blood pressure readings or are overweight. Talk with your health care provider about your target blood pressure readings. Have your blood pressure checked: Every 3-5 years if you are 52-66 years of age. Every year if you are 60 years old or older. If you are between the ages of 20 and 47 and are a current or former smoker, ask your health care provider if you should have a one-time screening for abdominal aortic aneurysm (AAA). Diabetes Have regular diabetes screenings. This checks your fasting blood sugar level. Have the screening done: Once every three years after age 70 if you are at a normal weight and have a low risk for diabetes. More often and at a younger age if you are overweight or have a high risk for diabetes. What should I know about preventing infection? Hepatitis B If you have a higher risk for hepatitis B, you should be screened for this virus. Talk with your health care provider to find out if you are at risk for hepatitis B infection. Hepatitis C Blood testing is recommended for: Everyone born from [birth year] through 1967. Anyone with known risk factors  for hepatitis C. Sexually transmitted infections (STIs) You should be screened each year for STIs, including gonorrhea and chlamydia, if: You are sexually active and are younger than 75 years of age. You are older than 75 years of age and your health care provider tells you that you are at risk for this type of infection. Your sexual activity has changed since you were last screened, and you are at increased  risk for chlamydia or gonorrhea. Ask your health care provider if you are at risk. Ask your health care provider about whether you are at high risk for HIV. Your health care provider may recommend a prescription medicine to help prevent HIV infection. If you choose to take medicine to prevent HIV, you should first get tested for HIV. You should then be tested every 3 months for as long as you are taking the medicine. Follow these instructions at home: Alcohol use Do not drink alcohol if your health care provider tells you not to drink. If you drink alcohol: Limit how much you have to 0-2 drinks a day. Know how much alcohol is in your drink. In the [place] one drink equals one 12 oz bottle of beer (355 mL), one 5 oz glass of wine (148 mL), or one 1 oz glass of hard liquor (44 mL). Lifestyle Do not use any products that contain nicotine or tobacco. These products include cigarettes, chewing tobacco, and vaping devices, such as e-cigarettes. If you need help quitting, ask your health care provider. Do not use street drugs. Do not share needles. Ask your health care provider for help if you need support or information about quitting drugs. General instructions Schedule regular health, dental, and eye exams. Stay current with your vaccines. Tell your health care provider if: You often feel depressed. You have ever been abused or do not feel safe at home. Summary Adopting a healthy lifestyle and getting preventive care are important in promoting health and wellness. Follow your health care provider's instructions about healthy diet, exercising, and getting tested or screened for diseases. Follow your health care provider's instructions on monitoring your cholesterol and blood pressure. This information is not intended to replace advice given to you by your health care provider. Make sure you discuss any questions you have with your health care provider. Document Revised: 03/30/2021 Document  Reviewed: 03/30/2021 Elsevier Patient Education  2024 [employer].

## 2024-01-18 ENCOUNTER — Ambulatory Visit: Payer: Medicare HMO | Admitting: Internal Medicine

## 2024-01-18 ENCOUNTER — Encounter: Payer: Self-pay | Admitting: Internal Medicine

## 2024-01-18 VITALS — BP 134/76 | HR 72 | Temp 97.5°F | Ht 71.0 in | Wt 216.4 lb

## 2024-01-18 DIAGNOSIS — Z0000 Encounter for general adult medical examination without abnormal findings: Secondary | ICD-10-CM

## 2024-01-18 DIAGNOSIS — N1832 Chronic kidney disease, stage 3b: Secondary | ICD-10-CM

## 2024-01-18 DIAGNOSIS — I10 Essential (primary) hypertension: Secondary | ICD-10-CM

## 2024-01-18 DIAGNOSIS — R21 Rash and other nonspecific skin eruption: Secondary | ICD-10-CM | POA: Diagnosis not present

## 2024-01-18 DIAGNOSIS — E1122 Type 2 diabetes mellitus with diabetic chronic kidney disease: Secondary | ICD-10-CM | POA: Diagnosis not present

## 2024-01-18 DIAGNOSIS — Z0001 Encounter for general adult medical examination with abnormal findings: Secondary | ICD-10-CM | POA: Diagnosis not present

## 2024-01-18 DIAGNOSIS — I5032 Chronic diastolic (congestive) heart failure: Secondary | ICD-10-CM | POA: Diagnosis not present

## 2024-01-18 DIAGNOSIS — R519 Headache, unspecified: Secondary | ICD-10-CM | POA: Diagnosis not present

## 2024-01-18 DIAGNOSIS — R197 Diarrhea, unspecified: Secondary | ICD-10-CM

## 2024-01-18 DIAGNOSIS — I2510 Atherosclerotic heart disease of native coronary artery without angina pectoris: Secondary | ICD-10-CM

## 2024-01-18 DIAGNOSIS — Z9861 Coronary angioplasty status: Secondary | ICD-10-CM

## 2024-01-18 DIAGNOSIS — E782 Mixed hyperlipidemia: Secondary | ICD-10-CM

## 2024-01-18 DIAGNOSIS — Z794 Long term (current) use of insulin: Secondary | ICD-10-CM

## 2024-01-18 DIAGNOSIS — I69354 Hemiplegia and hemiparesis following cerebral infarction affecting left non-dominant side: Secondary | ICD-10-CM

## 2024-01-18 LAB — COMPREHENSIVE METABOLIC PANEL
ALT: 19 U/L (ref 0–53)
AST: 21 U/L (ref 0–37)
Albumin: 4 g/dL (ref 3.5–5.2)
Alkaline Phosphatase: 68 U/L (ref 39–117)
BUN: 34 mg/dL — ABNORMAL HIGH (ref 6–23)
CO2: 27 meq/L (ref 19–32)
Calcium: 9.2 mg/dL (ref 8.4–10.5)
Chloride: 103 meq/L (ref 96–112)
Creatinine, Ser: 2 mg/dL — ABNORMAL HIGH (ref 0.40–1.50)
GFR: 32.35 mL/min — ABNORMAL LOW (ref >60.00)
Glucose, Bld: 191 mg/dL — ABNORMAL HIGH (ref 70–99)
Potassium: 4.2 meq/L (ref 3.5–5.1)
Sodium: 137 meq/L (ref 135–145)
Total Bilirubin: 0.6 mg/dL (ref 0.2–1.2)
Total Protein: 8.1 g/dL (ref 6.0–8.3)

## 2024-01-18 LAB — CBC WITH DIFFERENTIAL/PLATELET
Basophils Absolute: 0 10*3/uL (ref 0.0–0.1)
Basophils Relative: 0.6 % (ref 0.0–3.0)
Eosinophils Absolute: 0.5 10*3/uL (ref 0.0–0.7)
Eosinophils Relative: 5.9 % — ABNORMAL HIGH (ref 0.0–5.0)
HCT: 32.4 % — ABNORMAL LOW (ref 39.0–52.0)
Hemoglobin: 10.8 g/dL — ABNORMAL LOW (ref 13.0–17.0)
Lymphocytes Relative: 36.8 % (ref 12.0–46.0)
Lymphs Abs: 2.9 10*3/uL (ref 0.7–4.0)
MCHC: 33.3 g/dL (ref 30.0–36.0)
MCV: 93.6 fL (ref 78.0–100.0)
Monocytes Absolute: 0.9 10*3/uL (ref 0.1–1.0)
Monocytes Relative: 11 % (ref 3.0–12.0)
Neutro Abs: 3.6 10*3/uL (ref 1.4–7.7)
Neutrophils Relative %: 45.7 % (ref 43.0–77.0)
Platelets: 177 10*3/uL (ref 150.0–400.0)
RBC: 3.46 Mil/uL — ABNORMAL LOW (ref 4.22–5.81)
RDW: 12.8 % (ref 11.5–15.5)
WBC: 7.8 10*3/uL (ref 4.0–10.5)

## 2024-01-18 LAB — LIPID PANEL
Cholesterol: 113 mg/dL (ref 0–200)
HDL: 55.1 mg/dL (ref >39.00)
LDL Cholesterol: 42 mg/dL (ref 0–99)
NonHDL: 58
Total CHOL/HDL Ratio: 2
Triglycerides: 79 mg/dL (ref 0.0–149.0)
VLDL: 15.8 mg/dL (ref 0.0–40.0)

## 2024-01-18 LAB — TSH: TSH: 2.2 u[IU]/mL (ref 0.35–5.50)

## 2024-01-18 LAB — MICROALBUMIN / CREATININE URINE RATIO
Creatinine,U: 168.9 mg/dL
Microalb Creat Ratio: 108.4 mg/g — ABNORMAL HIGH (ref 0.0–30.0)
Microalb, Ur: 18.3 mg/dL — ABNORMAL HIGH (ref 0.0–1.9)

## 2024-01-18 LAB — C-REACTIVE PROTEIN: CRP: <1 mg/dL (ref 0.5–20.0)

## 2024-01-18 LAB — SEDIMENTATION RATE: Sed Rate: 44 mm/h — ABNORMAL HIGH (ref 0–20)

## 2024-01-18 MED ORDER — TRIAMCINOLONE ACETONIDE 0.1 % EX CREA: 1.0000 | TOPICAL_CREAM | Freq: Two times a day (BID) | CUTANEOUS | 0 refills | Status: DC

## 2024-01-18 MED ORDER — DOXYCYCLINE HYCLATE 100 MG PO TABS: 100.0000 mg | ORAL_TABLET | Freq: Two times a day (BID) | ORAL | 0 refills | Status: AC

## 2024-01-18 NOTE — Assessment & Plan Note (Signed)
 New Has symptoms of soreness and tenderness right side of forehead, cheek down into the neck Has some cold symptoms as well ?  Sinus infection-will try treating with doxycycline to see if that helps Given the tenderness and soreness that does start in the right forehead/temple area will check ESR, CRP to rule out arteritis Depending on the results of the above we will evaluate further or refer to ENT

## 2024-01-18 NOTE — Assessment & Plan Note (Signed)
 Chronic Secondary to CVA Has gotten a little worse Not currently taking tizanidine 2 mg at bedtime, but can restart if needed

## 2024-01-18 NOTE — Assessment & Plan Note (Signed)
 Chronic S/p stenting Following with cardiology and up-to-date with visits Denies symptoms consistent with angina Continue current medications

## 2024-01-18 NOTE — Assessment & Plan Note (Signed)
 Chronic Following with endocrine-Dr. Elvera Lennox Currently on Lantus, Humalog Stressed diabetic diet, regular exercise  Lab Results  Component Value Date   HGBA1C 7.5 (A) 12/09/2023   Check urine albumin/creatinine ratio

## 2024-01-18 NOTE — Assessment & Plan Note (Signed)
 Acute Rash on right upper arm and back Hard to say what the cause is, but stressed that he needs to stop scratching it and creating open wounds, delaying feeling which could be aggravating the rash Trial of triamcinolone cream twice daily as needed If not improving will refer to dermatology

## 2024-01-18 NOTE — Assessment & Plan Note (Signed)
 Chronic Blood pressure controlled CBC, CMP Continue losartan 100 mg daily, chlorthalidone 25 mg daily, Coreg 25 mg twice daily

## 2024-01-18 NOTE — Assessment & Plan Note (Signed)
 Chronic Euvolemic on exam Following with cardiology On Coreg 25 mg twice daily, chlorthalidone 25 mg daily, furosemide 40 mg daily

## 2024-01-18 NOTE — Assessment & Plan Note (Signed)
 States he is experiencing some diarrhea with some normal stools on a daily basis He states he is taking 4 laxatives daily Advised taking 3 laxatives a day to see if that improves his bowels-if not may need to decrease to 2 pills a day

## 2024-01-18 NOTE — Assessment & Plan Note (Signed)
 Chronic Following with nephrology Stable CBC, CMP

## 2024-01-18 NOTE — Assessment & Plan Note (Signed)
 Chronic Lab Results  Component Value Date   LDLCALC 38 12/24/2022    Check lipid panel, CMP, TSH Continue atorvastatin 40 mg daily

## 2024-01-22 ENCOUNTER — Other Ambulatory Visit (HOSPITAL_BASED_OUTPATIENT_CLINIC_OR_DEPARTMENT_OTHER): Payer: Self-pay | Admitting: Family

## 2024-01-22 DIAGNOSIS — I10 Essential (primary) hypertension: Secondary | ICD-10-CM

## 2024-01-23 ENCOUNTER — Telehealth: Payer: Self-pay

## 2024-01-23 NOTE — Telephone Encounter (Signed)
 Copied from CRM [id number]. Topic: Clinical - Lab/Test Results >> Jan 23, 2024  9:33 AM [place] J wrote: Reason for CRM: Lab results were relayed but patient has further questions about lab results.

## 2024-01-25 ENCOUNTER — Telehealth: Payer: Self-pay

## 2024-01-25 ENCOUNTER — Other Ambulatory Visit: Payer: Self-pay

## 2024-01-25 DIAGNOSIS — E1165 Type 2 diabetes mellitus with hyperglycemia: Secondary | ICD-10-CM

## 2024-01-25 MED ORDER — PANTOPRAZOLE SODIUM 40 MG PO TBEC: 40.0000 mg | DELAYED_RELEASE_TABLET | Freq: Every day | ORAL | 0 refills | Status: DC

## 2024-01-25 NOTE — Telephone Encounter (Signed)
 Being documented in separate note (result management)

## 2024-01-25 NOTE — Telephone Encounter (Signed)
 Fax sent via Onbase for medication request

## 2024-01-26 ENCOUNTER — Other Ambulatory Visit: Payer: Self-pay

## 2024-01-26 DIAGNOSIS — E1159 Type 2 diabetes mellitus with other circulatory complications: Secondary | ICD-10-CM

## 2024-01-26 MED ORDER — BD PEN NEEDLE NANO 2ND GEN 32G X 4 MM MISC: 0 refills | Status: DC

## 2024-02-08 ENCOUNTER — Ambulatory Visit (INDEPENDENT_AMBULATORY_CARE_PROVIDER_SITE_OTHER): Payer: Medicare HMO | Admitting: Cardiovascular Disease

## 2024-02-08 ENCOUNTER — Encounter (HOSPITAL_BASED_OUTPATIENT_CLINIC_OR_DEPARTMENT_OTHER): Payer: Self-pay | Admitting: Cardiovascular Disease

## 2024-02-08 VITALS — BP 124/68 | HR 59 | Ht 71.0 in | Wt 213.3 lb

## 2024-02-08 DIAGNOSIS — Z9861 Coronary angioplasty status: Secondary | ICD-10-CM | POA: Diagnosis not present

## 2024-02-08 DIAGNOSIS — I10 Essential (primary) hypertension: Secondary | ICD-10-CM | POA: Diagnosis not present

## 2024-02-08 DIAGNOSIS — I2510 Atherosclerotic heart disease of native coronary artery without angina pectoris: Secondary | ICD-10-CM | POA: Diagnosis not present

## 2024-02-08 DIAGNOSIS — I5032 Chronic diastolic (congestive) heart failure: Secondary | ICD-10-CM

## 2024-02-08 DIAGNOSIS — I6330 Cerebral infarction due to thrombosis of unspecified cerebral artery: Secondary | ICD-10-CM | POA: Diagnosis not present

## 2024-02-08 DIAGNOSIS — G4733 Obstructive sleep apnea (adult) (pediatric): Secondary | ICD-10-CM

## 2024-02-08 NOTE — Progress Notes (Signed)
 Cardiology Office Note:  .   Date:  02/08/2024  ID:  [name], DOB November 29, 1950, MRN [id number] PCP: Pincus Sanes, MD  [facility] Providers Cardiologist:  Chilton Si, MD    History of Present Illness: .   [name] is a 73 y.o. male with hypertension, CAD status post multiple PCI, CVA, diabetes mellitus type 2, hyperlipidemia, OSA, and prior tobacco abuse who presents for follow up.  [name] was referred by Dr. Cheryll Cockayne on 02/09/16. [name] had PCI with a BMS to OM while living in [place].  He had in-stent restenosis in 2005 and had a DES placed.   At that time he reported occasional episodes of chest pain relieved by aspirin.  He denied exertional chest pain but did note dizziness and nausea.  Additionally, he was noted to have a left bundle branch block that was not present on his last EKG in 2013. Therefore, he was referred for exercise Myoview 02/13/16 that revealed LVEF 39% with global hypokinesis but no ischemia.  Echo 03/01/16 revealed LV 40-45% with mild LVH and grade 2 diastolic dysfunction. There was also mild hypokinesis of the mid to apical anteroseptum.   He worked with our pharmacists and had achieved very good blood pressure control.  He is in a program at the [place] that checks his BP daily.     [name] was seen in the ED 06/2020 with NSTEMI.  He underwent cardiac catheterization.  His cath 8/19 revealed 95% stenosis of the ostial OM 2 just proximal to his prior OM stent.  A drug-eluting stent was successfully placed.  In follow-up he continued to be much better.  He had a repeat echo 07/2020 that revealed an improvement in his LVEF to 55% with grade 1 diastolic dysfunction.  Right atrial pressure was 3 mmHg.  He was referred for sleep study and found to have severe sleep apnea.     [name] was doing well at his last visit 07/2021.  He did note increased diaphoresis with exercise.  He had a Lexiscan Myoview that revealed LVEF 42% with anterior and  apical hypokinesis.  There was also a fixed inferior and apical defect consistent with prior infarct.  There was no ischemia.  At his visit 12/2022 he continued to report exertional dyspnea.  Plans were made for left heart cath.  However in the precath labs he was noted to be significantly anemic.  He also reported having dark stools.  Upper and lower endoscopy 04/2023 revealed polyps which were resected.  He was okay to resume clopidogrel.  Nuclear stress revealed ischemia but no infarct.  He is followed up in hypertension clinic and last saw Gillian Shields, NP 03/2023.  He was out of losartan for the preceding 2 days.  Losartan was refilled with plans to transition to valsartan if blood pressure remained uncontrolled.   At his visit 06/2019 plan of left arm and shoulder tightness.  Symptoms were atypical and not thought to be due to.  Blood pressure was above goal but he had not yet taken his medication.  He was seen in the ED 06/2023 with chest pain, nausea, and vomiting.  Was thought to be due to foodborne illness.  No ischemic testing was needed at the time.  Cardiac enzymes were negative.  He saw Gillian Shields, NP 08/2023.  He was struggling with a recent URI but had no cardiac symptoms.  [name] experienced chest pain last night while lying down, which persisted upon getting  out of bed but improved within five to ten minutes after chewing aspirin. No recent chest pain while walking or issues with acid reflux. A previous episode of chest pain occurred in August after vomiting, evaluated in the emergency department.  He has constant left-sided pain since experiencing a stroke. He has not been under the care of a neurologist and has tried gabapentin for nerve pain without relief.  He has not been checking his blood pressure at home and has not been engaging in regular exercise. He has access to a [employer] but finds the distance a barrier. He owns a stationary bike at home but has not been using it. He  describes his diet as 'good'.  A recent illness affected his household, including his two grandchildren and daughter, lasting about two days for each person. He did not take any medication during the weekend except for Friday night.     ROS:  As per HPI  Studies Reviewed: .       Echo 01/2023:   1. Left ventricular ejection fraction, by estimation, is 55%. The left  ventricle has normal function. The left ventricle has no regional wall  motion abnormalities. There is mild asymmetric left ventricular  hypertrophy of the basal-septal segment. Left  ventricular diastolic parameters are consistent with Grade I diastolic  dysfunction (impaired relaxation). There is septal-lateral dyssynchrony  secondary to LBBB.   2. Right ventricular systolic function is normal. The right ventricular  size is normal. There is normal pulmonary artery systolic pressure. The  estimated right ventricular systolic pressure is 22.2 mmHg.   3. The mitral valve is normal in structure. Trivial mitral valve  regurgitation.   4. The aortic valve is tricuspid. There is mild calcification of the  aortic valve. There is mild thickening of the aortic valve. Aortic valve  regurgitation is not visualized.   5. The inferior vena cava is normal in size with greater than 50%  respiratory variability, suggesting right atrial pressure of 3 mmHg.    Risk Assessment/Calculations:             Physical Exam:   VS:  BP 124/68 (BP Location: Left Arm, Patient Position: Sitting)   Pulse (!) 59   Ht 5\' 11"  (1.803 m)   Wt 213 lb 4.8 oz (96.8 kg)   SpO2 97%   BMI 29.75 kg/m  , BMI Body mass index is 29.75 kg/m. GENERAL:  Well appearing HEENT: Pupils equal round and reactive, fundi not visualized, oral mucosa unremarkable NECK:  No jugular venous distention, waveform within normal limits, carotid upstroke brisk and symmetric, no bruits, no thyromegaly LUNGS:  Clear to auscultation bilaterally HEART:  RRR.  PMI not displaced  or sustained,S1 and S2 within normal limits, no S3, no S4, no clicks, no rubs, no murmurs ABD:  Flat, positive bowel sounds normal in frequency in pitch, no bruits, no rebound, no guarding, no midline pulsatile mass, no hepatomegaly, no splenomegaly EXT:  2 plus pulses throughout, no edema, no cyanosis no clubbing SKIN:  No rashes no nodules NEURO:  L sided weakness.  PSYCH:  Cognitively intact, oriented to person place and time   ASSESSMENT AND PLAN: .    # CAD s/p PCI:  # Chest Pain He has a history of multiple PCIs.  He has CP but atypical.  Intermittent supine chest pain resolved with aspirin, likely non-cardiac. Previous normal stress test 12/2022 indicates stable cardiac status. - Encourage resumption of exercise routine to assess for exertional chest pain. -  Advise to report any chest pain during exercise for potential stress test evaluation.   # hyperlipidemia:  Lipids are very well-controlled.  Continue atorvastatin.    # hypertension:  BP well-controlled on carvedilol, chlorthalidone, and losartan.    # Prior CVA: Continue aspirin, Plavix and statin.   # Exercise and Lifestyle Limited exercise due to gym distance. Stationary bike available but not used. Diet and labs indicate good cardiovascular health. - Encourage use of stationary bike at home, starting with 10 minutes daily. - Reinforce benefits of regular exercise for cardiovascular health.  # Post-stroke Pain Persistent left-sided pain post-stroke, ineffective gabapentin, no neurology follow-up. - Recommend discussing pain management options with primary care provider. - Consider referral to neurology for further evaluation and management of post-stroke pain.  # Follow-up Routine follow-up needed to monitor cardiac status and exercise response. - Schedule follow-up appointment in six months. - Advise to contact if any new symptoms or concerns arise before the next scheduled visit.      Dispo: f/u in 6 months  with APP.  1 year with me.   Signed, Chilton Si, MD

## 2024-02-08 NOTE — Patient Instructions (Signed)
 Medication Instructions:  Your physician recommends that you continue on your current medications as directed. Please refer to the Current Medication list given to you today.   *If you need a refill on your cardiac medications before your next appointment, please call your pharmacy*  Lab Work: NONE  Testing/Procedures: NONE  Follow-Up: At [facility], you and your health needs are our priority.  As part of our continuing mission to provide you with exceptional heart care, we have created designated Provider Care Teams.  These Care Teams include your primary Cardiologist (physician) and Advanced Practice Providers (APPs -  Physician Assistants and Nurse Practitioners) who all work together to provide you with the care you need, when you need it.  We recommend signing up for the patient portal called "MyChart".  Sign up information is provided on this After Visit Summary.  MyChart is used to connect with patients for Virtual Visits (Telemedicine).  Patients are able to view lab/test results, encounter notes, upcoming appointments, etc.  Non-urgent messages can be sent to your provider as well.   To learn more about what you can do with MyChart, go to [email or web address].    Your next appointment:   6 month(s)  Provider:   Ronn Melena NP   DR [facility] 1  YEAR

## 2024-02-11 ENCOUNTER — Other Ambulatory Visit: Payer: Self-pay | Admitting: Internal Medicine

## 2024-02-11 DIAGNOSIS — E1165 Type 2 diabetes mellitus with hyperglycemia: Secondary | ICD-10-CM

## 2024-02-15 ENCOUNTER — Other Ambulatory Visit: Payer: Self-pay | Admitting: Internal Medicine

## 2024-02-15 ENCOUNTER — Encounter: Payer: Self-pay | Admitting: Podiatry

## 2024-02-15 ENCOUNTER — Ambulatory Visit (INDEPENDENT_AMBULATORY_CARE_PROVIDER_SITE_OTHER): Payer: Medicare HMO | Admitting: Podiatry

## 2024-02-15 DIAGNOSIS — M79674 Pain in right toe(s): Secondary | ICD-10-CM | POA: Diagnosis not present

## 2024-02-15 DIAGNOSIS — E1142 Type 2 diabetes mellitus with diabetic polyneuropathy: Secondary | ICD-10-CM

## 2024-02-15 DIAGNOSIS — M79675 Pain in left toe(s): Secondary | ICD-10-CM

## 2024-02-15 DIAGNOSIS — B351 Tinea unguium: Secondary | ICD-10-CM | POA: Diagnosis not present

## 2024-02-15 LAB — MICROALBUMIN / CREATININE URINE RATIO: Microalb Creat Ratio: 61

## 2024-02-15 NOTE — Progress Notes (Signed)
 This patient returns to my office for at risk foot care.  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  Patient is taking plavix.  Patient has history of CVA.  This patient is unable to cut nails himself since the patient cannot reach his nails.These nails are painful walking and wearing shoes. Pain through arch and heel left foot. This patient presents for at risk foot care today.  General Appearance  Alert, conversant and in no acute stress.  Vascular  Dorsalis pedis and posterior tibial  pulses are weakly  palpable  bilaterally.  Capillary return is within normal limits  Bilaterally.Cold feet left.  .  Absent digital hair  B/L.  Neurologic  Senn-Weinstein monofilament wire test diminished  bilaterally. Muscle power within normal limits bilaterally.  Nails Thick disfigured discolored nails with subungual debris  from hallux to fifth toes bilaterally. No evidence of bacterial infection or drainage bilaterally.  Orthopedic  No limitations of motion  feet .  No crepitus or effusions noted.  No bony pathology or digital deformities noted. Plantar fasciitis left foot.  Skin  normotropic skin with no porokeratosis noted bilaterally.  No signs of infections or ulcers noted.  Midfoot DJD left..  Hammer toes  B/L.   Onychomycosis  Pain in right toes  Pain in left toes  Neuropathic pain.  Consent was obtained for treatment procedures.   Mechanical debridement of nails 1-5  bilaterally performed with a nail nipper.  Filed with dremel without incident.  Patient says he is still falling.  Told him he should see a neurologist for thorough evaluation.    Return office visit   3 months                   Told patient to return for periodic foot care and evaluation due to potential at risk complications.   Helane Gunther DPM

## 2024-02-19 ENCOUNTER — Other Ambulatory Visit (HOSPITAL_BASED_OUTPATIENT_CLINIC_OR_DEPARTMENT_OTHER): Payer: Self-pay | Admitting: Family

## 2024-03-09 ENCOUNTER — Ambulatory Visit
Admission: EM | Admit: 2024-03-09 | Discharge: 2024-03-09 | Disposition: A | Discharge status: Home / Self Care | Attending: Family Medicine | Admitting: Family Medicine

## 2024-03-09 ENCOUNTER — Encounter: Payer: Self-pay | Admitting: Emergency Medicine

## 2024-03-09 ENCOUNTER — Ambulatory Visit

## 2024-03-09 DIAGNOSIS — R059 Cough, unspecified: Secondary | ICD-10-CM

## 2024-03-09 DIAGNOSIS — J069 Acute upper respiratory infection, unspecified: Secondary | ICD-10-CM

## 2024-03-09 DIAGNOSIS — R062 Wheezing: Secondary | ICD-10-CM

## 2024-03-09 LAB — POC SARS CORONAVIRUS 2 AG -  ED: SARS Coronavirus 2 Ag: NEGATIVE

## 2024-03-09 MED ORDER — SPACER/AERO-HOLDING CHAMBERS DEVI: 1.0000 | 0 refills | Status: AC | PRN

## 2024-03-09 MED ORDER — FLUTICASONE PROPIONATE 50 MCG/ACT NA SUSP: 2.0000 | Freq: Every day | NASAL | 0 refills | Status: AC

## 2024-03-09 MED ORDER — ALBUTEROL SULFATE (2.5 MG/3ML) 0.083% IN NEBU
2.5000 mg | INHALATION_SOLUTION | Freq: Once | RESPIRATORY_TRACT | Status: AC
  Administered 2024-03-09: 2.5 mg via RESPIRATORY_TRACT

## 2024-03-09 MED ORDER — MOMETASONE FURO-FORMOTEROL FUM 100-5 MCG/ACT IN AERO: INHALATION_SPRAY | RESPIRATORY_TRACT | 0 refills | Status: DC

## 2024-03-09 MED ORDER — BUDESONIDE-FORMOTEROL FUMARATE 80-4.5 MCG/ACT IN AERO: INHALATION_SPRAY | RESPIRATORY_TRACT | 0 refills | Status: DC

## 2024-03-09 NOTE — Discharge Instructions (Addendum)
 Also consider taking Mucinex  (generic is ok to take ) and using saline nasal spray for your nasal congestion

## 2024-03-09 NOTE — ED Triage Notes (Signed)
 Pt presents with cough, diarrhea, and chest congestion x 2 days. Pt denies emesis.

## 2024-03-09 NOTE — ED Provider Notes (Addendum)
 [facility]    CSN: [id number] Arrival date & time: 03/09/24  1202      History   Chief Complaint Chief Complaint  Patient presents with   Cough   Wheezing   chest congestion     HPI [name] is a 73 y.o. male. Cough, congestion x 2-3 days. Whole household is sick with similar. SOB, wheezing, cough is biggest concern; gets into coughing fits. Denies chest pain except has felt a "pinching" pain in his L chest when he has a coughing fit; last time this happened was last night; when he stops couging,the pain goes away. Not taking any medicines for this. No hx asthma, copd, has never used an inhaler   Cough Associated symptoms: wheezing   Wheezing Associated symptoms: cough     Past Medical History:  Diagnosis Date   ALLERGIC RHINITIS    CAD, NATIVE VESSEL    BMS to OM1 2001, DES to BMS 2005   Chronic back pain    herniated disc   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   Chronic kidney disease    Constipation    takes Carafate  four times day   DIAB W/UNSPEC COMP TYPE II/UNSPEC TYPE UNCNTRL    ERECTILE DYSFUNCTION    GERD    HYPERLIPIDEMIA-MIXED    HYPERTENSION, BENIGN    LBBB (left bundle branch block) 02/02/2016   MORTON'S NEUROMA, RIGHT    OSA on CPAP 09/09/2020   Peripheral neuropathy    Seasonal allergies    takes Allegra and Benadryl  daily prn;uses Flonase  daily   SHOULDER PAIN, RIGHT    Snoring 09/09/2020   Stroke, 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, s/p CIR    TIA on medication 02/2012   Unstable angina (HCC) 07/09/2020    Patient Active Problem List   Diagnosis Date Noted   Rash and other nonspecific skin eruption 01/18/2024   Right-sided face pain 01/18/2024   Conjunctivitis 12/16/2023   Near syncope 12/15/2023   Dehydration 12/15/2023   Plantar fasciitis of left foot 11/09/2023   Pancreatic mass -MRI for follow-up due 06/2024 07/19/2023   Calculus of gallbladder without  cholecystitis without obstruction 07/19/2023   Constipation 07/19/2023   Intractable nausea and vomiting 07/14/2023   Epigastric pain 07/14/2023   Transaminitis 07/14/2023   Back pain 03/04/2023   Diarrhea 06/09/2022   Ganglion cyst of wrist, left 12/14/2021   Urinary frequency 11/06/2021   Hoarse 03/25/2021   Moderate episode of recurrent major depressive disorder (HCC) 03/25/2021   Major depressive disorder with single episode 03/25/2021   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   Vocal cord paralysis 03/25/2021   Diabetic peripheral neuropathy (HCC) 12/25/2020   Orthopnea 12/16/2020   OSA on CPAP 09/09/2020   Sciatica of left side 07/11/2019   Cough 07/11/2019   Edema leg 10/26/2018   History of stroke 10/26/2018   DOE (dyspnea on exertion) 10/26/2018   Left hip pain 07/14/2018   Acute left ankle pain 07/14/2018   Chest pain 07/14/2018   Nonintractable headache 02/24/2018   CKD stage 3b, GFR 30-44 ml/min (HCC) 02/22/2017   Anemia of chronic disease 02/16/2017   Chronic diastolic CHF (congestive heart failure) (HCC) 06/29/2016   LBBB (left bundle branch block) 02/02/2016   Nonallopathic lesion of lumbosacral region 12/11/2014   Nonallopathic lesion of sacral region 12/11/2014   Nonallopathic lesion of thoracic region 12/11/2014   Arthritis of left hip 11/20/2014   Ischial bursitis of left side 10/28/2014   Hamstring  tightness of left lower extremity 09/16/2014   Piriformis syndrome of left side 09/16/2014   Dysphonia 06/22/2013   Hemiparesis affecting left side as late effect of stroke (HCC) 10/10/2012   Dysphagia following cerebrovascular accident 08/14/2012   Chronic back pain    Peripheral neuropathy (HCC)    TIA (transient ischemic attack) 04/08/2012   Stroke (HCC) 02/21/2012   MORTON'S NEUROMA, RIGHT 05/29/2010   DM2 (diabetes mellitus, type 2) (HCC) 04/13/2010   Allergic rhinitis 04/13/2010   GERD (gastroesophageal reflux disease) 04/13/2010   ERECTILE DYSFUNCTION  03/12/2009   Essential hypertension 03/12/2009   CAD S/P percutaneous coronary angioplasty 03/12/2009   Hyperlipidemia 03/07/2009    Past Surgical History:  Procedure Laterality Date   ANGIOPLASTY     COLONOSCOPY     CORONARY ANGIOPLASTY  2005   2 stents   CORONARY STENT INTERVENTION N/A 07/10/2020   Procedure: CORONARY STENT INTERVENTION;  Surgeon: Swaziland, Peter M, MD;  Location: [facility] INVASIVE CV LAB;  Service: Cardiovascular;  Laterality: N/A;   DENTAL SURGERY     LARYNGOPLASTY  08/07/2012   Procedure: LARYNGOPLASTY;  Surgeon: Janita Mellow, MD;  Location: [facility] OR;  Service: ENT;  Laterality: Left;  Left Vocal Cord Medialyzation   LEFT HEART CATH AND CORONARY ANGIOGRAPHY N/A 07/10/2020   Procedure: LEFT HEART CATH AND CORONARY ANGIOGRAPHY;  Surgeon: Swaziland, Peter M, MD;  Location: [facility] INVASIVE CV LAB;  Service: Cardiovascular;  Laterality: N/A;   left knee surgury     x 2    stent  2001, 2004   coronary stents       Home Medications    Prior to Admission medications   Medication Sig Start Date End Date Taking? Authorizing Provider  fluticasone  (FLONASE ) 50 MCG/ACT nasal spray Place 2 sprays into both nostrils daily. 03/09/24  Yes Blinda Burger, NP  mometasone -formoterol  (DULERA) 100-5 MCG/ACT AERO 2 puffs BID and prn shortness of breath or wheezing 03/09/24  Yes Blinda Burger, NP  Spacer/Aero-Holding Idelle Majors DEVI 1 each by Does not apply route as needed. 03/09/24  Yes Blinda Burger, NP  aspirin  EC 81 MG tablet Take 1 tablet (81 mg total) by mouth daily. Swallow whole. 07/10/20   Dunn, Dayna N, PA-C  atorvastatin  (LIPITOR) 40 MG tablet TAKE 1 TABLET EVERY DAY 02/20/24   Walker, [name], NP  BD PEN NEEDLE NANO 2ND GEN 32G X 4 MM MISC USE AS DIRECTED THREE TIMES DAILY 02/14/24   Emilie Harden, MD  Blood Pressure Monitoring (BLOOD PRESSURE CUFF) MISC 1 Units by Does not apply route daily. 02/03/21   Maudine Sos, MD  calcium  carbonate (TUMS - DOSED IN MG ELEMENTAL CALCIUM ) 500  MG chewable tablet Chew 1 tablet by mouth daily as needed for indigestion or heartburn.    [provider]  carvedilol  (COREG ) 25 MG tablet TAKE 1 TABLET TWICE DAILY 11/10/23   Maudine Sos, MD  chlorthalidone  (HYGROTON ) 25 MG tablet TAKE 1 TABLET EVERY DAY 11/28/23   Maudine Sos, MD  clopidogrel  (PLAVIX ) 75 MG tablet TAKE 1 TABLET EVERY DAY 12/07/23   Maudine Sos, MD  Continuous Glucose Sensor (FREESTYLE LIBRE 2 SENSOR) MISC 1 Device by Does not apply route every 14 (fourteen) days. 08/09/23   Emilie Harden, MD  furosemide  (LASIX ) 40 MG tablet TAKE 1 TABLET (40 MG TOTAL) BY MOUTH DAILY. NEED APPT. 09/16/21   Maudine Sos, MD  glucose blood (ACCU-CHEK AVIVA PLUS) test strip uad tid 12/14/21   Colene Dauphin, MD  insulin  glargine (LANTUS  SOLOSTAR)  100 UNIT/ML Solostar Pen Inject 10-14 Units into the skin daily. 12/09/23   Emilie Harden, MD  insulin  lispro (HUMALOG  KWIKPEN) 100 UNIT/ML KwikPen Inject 6-10 Units into the skin 2 (two) times daily before a meal. 12/09/23   Emilie Harden, MD  losartan  (COZAAR ) 100 MG tablet TAKE 1 TABLET EVERY DAY 01/23/24   Maudine Sos, MD  Menthol , Topical Analgesic, 4 % GEL Apply topically.    [provider]  Microlet Lancets MISC UAD to check sugars TID.  E11.22 12/14/21   Colene Dauphin, MD  nitroGLYCERIN  (NITROSTAT ) 0.4 MG SL tablet Place 1 tablet (0.4 mg total) under the tongue every 5 (five) minutes as needed for chest pain (up to 3 doses). 12/23/22 12/23/23  Maudine Sos, MD  senna-docusate (SENOKOT-S) 8.6-50 MG per tablet Take 2 tablets by mouth 2 (two) times daily. For constipation. 09/07/12   Love, Renay Carota, PA-C    Family History Family History  Problem Relation Age of Onset   Cancer Mother    Liver cancer Brother    Colon cancer Neg Hx    Esophageal cancer Neg Hx     Social History Social History   Tobacco Use   Smoking status: Former    Current packs/day: 0.00    Average packs/day: 2.5  packs/day for 9.0 years (22.5 ttl pk-yrs)    Types: Cigarettes    Start date: 02/23/1974    Quit date: 02/24/1983    Years since quitting: 41.0   Smokeless tobacco: Never  Vaping Use   Vaping status: Never Used  Substance Use Topics   Alcohol use: No    Alcohol/week: 0.0 standard drinks of alcohol   Drug use: No     Allergies   Penicillins, Pneumococcal vaccines, Shellfish allergy, Barbiturates, Gabapentin , Influenza vaccines, Lisinopril , Other, Sildenafil, Topiramate, Zofran  [ondansetron ], Amlodipine , Metformin , Tadalafil, and Vardenafil   Review of Systems Review of Systems  Respiratory:  Positive for cough and wheezing.      Physical Exam Triage Vital Signs ED Triage Vitals  Encounter Vitals Group     BP 03/09/24 1229 (!) 134/97     Systolic BP Percentile --      Diastolic BP Percentile --      Pulse Rate 03/09/24 1229 76     Resp 03/09/24 1229 20     Temp 03/09/24 1229 99.2 F (37.3 C)     Temp Source 03/09/24 1229 Oral     SpO2 03/09/24 1229 98 %     Weight 03/09/24 1228 213 lb 6.5 oz (96.8 kg)     Height --      Head Circumference --      Peak Flow --      Pain Score 03/09/24 1228 8     Pain Loc --      Pain Education --      Exclude from Growth Chart --    No data found.  Updated Vital Signs BP 111/68 (BP Location: Left Arm)   Pulse 87   Temp 99.8 F (37.7 C) (Oral)   Resp (!) 22   Wt 213 lb 6.5 oz (96.8 kg)   SpO2 97%   BMI 29.76 kg/m   Visual Acuity Right Eye Distance:   Left Eye Distance:   Bilateral Distance:    Right Eye Near:   Left Eye Near:    Bilateral Near:     Physical Exam Constitutional:      Appearance: Normal appearance. He is ill-appearing.  HENT:     Right  Ear: Tympanic membrane, ear canal and external ear normal.     Left Ear: Tympanic membrane, ear canal and external ear normal.     Nose: Congestion present.     Mouth/Throat:     Mouth: Mucous membranes are moist.     Pharynx: Oropharynx is clear.  Cardiovascular:      Rate and Rhythm: Normal rate and regular rhythm.  Pulmonary:     Effort: Pulmonary effort is normal.     Breath sounds: Wheezing present.     Comments: Diffuse wheezing Lymphadenopathy:     Cervical: No cervical adenopathy.  Neurological:     Mental Status: He is alert.      UC Treatments / Results  Labs (all labs ordered are listed, but only abnormal results are displayed) Labs Reviewed  POC SARS CORONAVIRUS 2 AG -  ED    EKG   Radiology DG Chest 2 View Result Date: 03/09/2024 CLINICAL DATA:  Wheezing and shortness of breath. Cough and congestion for 2 days EXAM: CHEST - 2 VIEW COMPARISON:  Chest x-ray 07/14/2023 FINDINGS: No consolidation, pneumothorax or effusion. No edema. Normal cardiopericardial silhouette. IMPRESSION: No acute cardiopulmonary disease. Electronically Signed   By: Adrianna Horde M.D.   On: 03/09/2024 14:56    Procedures Procedures (including critical care time)  Medications Ordered in UC Medications  albuterol  (PROVENTIL ) (2.5 MG/3ML) 0.083% nebulizer solution 2.5 mg (2.5 mg Nebulization Given 03/09/24 1509)    Initial Impression / Assessment and Plan / UC Course  I have reviewed the triage vital signs and the nursing notes.  Pertinent labs & imaging results that were available during my care of the patient were reviewed by me and considered in my medical decision making (see chart for details).    Albuterol  neb + very warm room made him feel nauseated. Cooling room and drinking some water resolved nausea. Breathing improved with albuterol , Lungs BTA B now.   CXR negative for pna and covid neg. Unlikely influenza. I think he would benefit from inhaler and resp ICS. Insurance does not cover symbicort , will try dulera BID and prn. Discussed with pt reasons for seeking re-eval.   Final Clinical Impressions(s) / UC Diagnoses   Final diagnoses:  Cough, unspecified type  Upper respiratory tract infection, unspecified type  Wheezing     Discharge  Instructions      Also consider taking Mucinex  (generic is ok to take ) and using saline nasal spray for your nasal congestion   ED Prescriptions     Medication Sig Dispense Auth. Provider   mometasone -formoterol  (DULERA) 100-5 MCG/ACT AERO 2 puffs BID and prn shortness of breath or wheezing 1 each Blinda Burger, NP   Spacer/Aero-Holding Idelle Majors DEVI 1 each by Does not apply route as needed. 1 each Dustee Bottenfield M, NP   fluticasone  (FLONASE ) 50 MCG/ACT nasal spray Place 2 sprays into both nostrils daily. 16 g Blinda Burger, NP      PDMP not reviewed this encounter.   Blinda Burger, NP 03/09/24 1619    Blinda Burger, NP 03/09/24 [phone]

## 2024-03-19 ENCOUNTER — Other Ambulatory Visit: Payer: Self-pay | Admitting: Internal Medicine

## 2024-03-19 DIAGNOSIS — E1159 Type 2 diabetes mellitus with other circulatory complications: Secondary | ICD-10-CM

## 2024-03-28 ENCOUNTER — Ambulatory Visit (INDEPENDENT_AMBULATORY_CARE_PROVIDER_SITE_OTHER): Payer: Medicare HMO | Admitting: Internal Medicine

## 2024-03-28 ENCOUNTER — Encounter: Payer: Self-pay | Admitting: Internal Medicine

## 2024-03-28 VITALS — BP 124/70 | HR 69 | Ht 71.0 in | Wt 213.0 lb

## 2024-03-28 DIAGNOSIS — E7849 Other hyperlipidemia: Secondary | ICD-10-CM

## 2024-03-28 DIAGNOSIS — Z794 Long term (current) use of insulin: Secondary | ICD-10-CM | POA: Diagnosis not present

## 2024-03-28 DIAGNOSIS — E1165 Type 2 diabetes mellitus with hyperglycemia: Secondary | ICD-10-CM

## 2024-03-28 DIAGNOSIS — E1159 Type 2 diabetes mellitus with other circulatory complications: Secondary | ICD-10-CM | POA: Diagnosis not present

## 2024-03-28 LAB — POCT GLYCOSYLATED HEMOGLOBIN (HGB A1C): Hemoglobin A1C: 8 % — AB (ref 4.0–5.6)

## 2024-03-28 NOTE — Patient Instructions (Addendum)
 Please change the regimen: - Lantus  10 units at bedtime - Humalog  6-10 units 15 min before brunch and dinner If you inject after the meal, only inject 4-6 units.  STOP SWEET TEA or ANY SWEET DRINK!   Please return in 3-4 months.

## 2024-03-28 NOTE — Progress Notes (Signed)
 Patient ID: [name], male   DOB: [birth year], 73 y.o.   MRN: [id number]  HPI: [name] is a 73 y.o.-year-old male, returning for follow-up for DM2, dx in 2007, insulin -dependent since 2011, uncontrolled, with complications (CAD, stroke, CKD, DR, ED, PN). Pt. previously saw Dr. Washington Hacker, last visit with me 4 months ago.  Interim history: + increased urination,no nausea, no chest pain.  He continues to have blurry vision (cataracts) >> will have Sx this summer. He has a URI >> cough and throat irritation. Since last visit, he started to drink sweet tea.  Reviewed HbA1c: Lab Results  Component Value Date   HGBA1C 7.5 (A) 12/09/2023   HGBA1C 8.3 (H) 07/14/2023   HGBA1C 8.3 (A) 04/05/2023   HGBA1C 8.4 (A) 12/21/2022   HGBA1C 8.0 (A) 08/17/2022   HGBA1C 7.4 (A) 04/16/2022   HGBA1C 6.9 (A) 01/04/2022   HGBA1C 6.8 (A) 11/03/2021   HGBA1C 6.6 (A) 08/18/2021   HGBA1C 6.7 (A) 05/11/2021  10/11/2023 HbA1c 7.9% ([facility]) 04/22/2022: HbA1c 7.5%  Previously on: - Humalog  50/50 (prev. From PAP, now covered by the new insurance w/o a PA) 22 units after brunch >> moved to 15 minutes before brunch -  -stopped  in 12/2021 because feeling "weird, sweaty" He has significant tremors and cannot draw insulin  from vials.  At last visit he was on: - Humalog  50/50 24 >> 18-20 >> 22 units 15 min before brunch He tried  Farxiga  10 mg before b'fast >> too $$$, did not qualify for PAP  We changed to: - Lantus  14 >> 12 units at bedtime - Humalog  6-10 units 15 min before brunch and dinner - If you inject after the meal, only inject 4-6 units.  Pt checks his sugars >4x a day and they are ([facility]):  Prev.:  Previously:  Lowest sugar was 53 >> 54 >> 50s; he has hypoglycemia awareness at 70.  Highest sugar was 300s >> upper 200s >> 350.  Glucometer: Accu-Chek  She usually has 2 meals: Brunch (11-am -2 pm) and dinner (6-8 pm).  - + CKD-sees nephrology, last BUN/creatinine:  02/15/2024:  33/1.97 01/18/2024: ACR 108.4 10/11/2023: 29/2.22, GFR 31, glucose 122, ACR 28.4 Lab Results  Component Value Date   BUN 34 (H) 01/18/2024   BUN 27 (H) 12/14/2023   CREATININE 2.00 (H) 01/18/2024   CREATININE 2.21 (H) 12/14/2023   Lab Results  Component Value Date   MICRALBCREAT 108.4 (H) 01/18/2024   MICRALBCREAT 85 02/26/2023   MICRALBCREAT 3.8 08/17/2016   MICRALBCREAT 3.2 08/07/2015   MICRALBCREAT 6.0 09/09/2014   MICRALBCREAT 2.4 01/30/2013  04/22/2022: ACR 33.7 On Cozaar  100 mg daily.  - + HL; last set of lipids: Lab Results  Component Value Date   CHOL 113 01/18/2024   HDL 55.10 01/18/2024   LDLCALC 42 01/18/2024   LDLDIRECT 63.0 01/12/2016   TRIG 79.0 01/18/2024   CHOLHDL 2 01/18/2024  10/11/2023 ([facility]):112/106/51.3/34 On Lipitor 40 mg daily.  - last eye exam was 08/11/2023 (Dr. Augustus Ledger): + DR. + Cataracts. He will gave Sx in 07/ and 06/2024.  On ASA 81.  - no numbness and tingling in his feet.  Last foot exam 11/09/2023 by Dr. Zettie Hillock.  TSH levels: 01/18/2024: TSH 2.2 10/11/2023: TSH 1.872 01/25/2023: TSH 2.61 04/22/2022: TSH 2.46  He was admitted with nausea/vomiting, CP - in 06/2023.  At that time, he was found to have cholelithiasis.  He had a slightly high lipase of 57 (11-51).    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   Chronic kidney disease    Constipation    takes Carafate  four times day   DIAB W/UNSPEC COMP TYPE II/UNSPEC TYPE UNCNTRL    ERECTILE DYSFUNCTION    GERD    HYPERLIPIDEMIA-MIXED    HYPERTENSION, BENIGN    LBBB (left bundle branch block) 02/02/2016   MORTON'S NEUROMA, RIGHT    OSA on CPAP 09/09/2020   Peripheral neuropathy    Seasonal allergies    takes Allegra and Benadryl  daily prn;uses Flonase  daily   SHOULDER PAIN, RIGHT     Snoring 09/09/2020   Stroke, 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, s/p CIR    TIA on medication 02/2012   Unstable angina (HCC) 07/09/2020   Past Surgical History:  Procedure Laterality Date   ANGIOPLASTY     COLONOSCOPY     CORONARY ANGIOPLASTY  2005   2 stents   CORONARY STENT INTERVENTION N/A 07/10/2020   Procedure: CORONARY STENT INTERVENTION;  Surgeon: Swaziland, Peter M, MD;  Location: [facility] INVASIVE CV LAB;  Service: Cardiovascular;  Laterality: N/A;   DENTAL SURGERY     LARYNGOPLASTY  08/07/2012   Procedure: LARYNGOPLASTY;  Surgeon: Janita Mellow, MD;  Location: [facility] OR;  Service: ENT;  Laterality: Left;  Left Vocal Cord Medialyzation   LEFT HEART CATH AND CORONARY ANGIOGRAPHY N/A 07/10/2020   Procedure: LEFT HEART CATH AND CORONARY ANGIOGRAPHY;  Surgeon: Swaziland, Peter M, MD;  Location: [facility] INVASIVE CV LAB;  Service: Cardiovascular;  Laterality: N/A;   left knee surgury     x 2    stent  2001, 2004   coronary stents   Social History   Socioeconomic History   Marital status: Legally Separated    Spouse name: Not on file   Number of children: 3   Years of education: college   Highest education level: Not on file  Occupational History   Occupation: disabled    Employer: [employer]  Tobacco Use   Smoking status: Former    Current packs/day: 0.00    Average packs/day: 2.5 packs/day for 9.0 years (22.5 ttl pk-yrs)    Types: Cigarettes    Start date: 02/23/1974    Quit date: 02/24/1983    Years since quitting: 41.1   Smokeless tobacco: Never  Vaping Use   Vaping status: Never Used  Substance and Sexual Activity   Alcohol use: No    Alcohol/week: 0.0 standard drinks of alcohol   Drug use: No   Sexual activity: Never  Other Topics Concern   Not on file  Social History Narrative      Social Drivers of Health   Financial Resource Strain: Low Risk  (09/28/2023)   Overall Financial Resource Strain (CARDIA)    Difficulty of Paying Living Expenses: Not hard at all  Food  Insecurity: No Food Insecurity (09/28/2023)   Hunger Vital Sign    Worried About Running Out of Food in the Last Year: Never true    Ran Out of Food in the Last Year: Never true  Transportation Needs: No Transportation Needs (09/28/2023)   PRAPARE - Administrator, Civil Service (Medical): No    Lack of Transportation (Non-Medical): No  Physical Activity: Inactive (09/28/2023)   Exercise Vital  Sign    Days of Exercise per Week: 0 days    Minutes of Exercise per Session: 0 min  Stress: No Stress Concern Present (09/28/2023)   [employer] of Occupational Health - Occupational Stress Questionnaire    Feeling of Stress : Not at all  Social Connections: Socially Isolated (09/28/2023)   Social Connection and Isolation Panel [NHANES]    Frequency of Communication with Friends and Family: Three times a week    Frequency of Social Gatherings with Friends and Family: Three times a week    Attends Religious Services: Never    Active Member of Clubs or Organizations: No    Attends Banker Meetings: Never    Marital Status: Separated  Intimate Partner Violence: Not At Risk (09/28/2023)   Humiliation, Afraid, Rape, and Kick questionnaire    Fear of Current or Ex-Partner: No    Emotionally Abused: No    Physically Abused: No    Sexually Abused: No   Current Outpatient Medications on File Prior to Visit  Medication Sig Dispense Refill   aspirin  EC 81 MG tablet Take 1 tablet (81 mg total) by mouth daily. Swallow whole. 30 tablet 11   atorvastatin  (LIPITOR) 40 MG tablet TAKE 1 TABLET EVERY DAY 90 tablet 3   BD PEN NEEDLE NANO U/F 32G X 4 MM MISC USE AS DIRECTED 200 each 5   Blood Pressure Monitoring (BLOOD PRESSURE CUFF) MISC 1 Units by Does not apply route daily. 1 each 0   budesonide -formoterol  (SYMBICORT ) 80-4.5 MCG/ACT inhaler 2 puffs BID and prn shortness of breath or wheezing 1 each 0   calcium  carbonate (TUMS - DOSED IN MG ELEMENTAL CALCIUM ) 500 MG chewable tablet  Chew 1 tablet by mouth daily as needed for indigestion or heartburn.     carvedilol  (COREG ) 25 MG tablet TAKE 1 TABLET TWICE DAILY 180 tablet 3   chlorthalidone  (HYGROTON ) 25 MG tablet TAKE 1 TABLET EVERY DAY 90 tablet 3   clopidogrel  (PLAVIX ) 75 MG tablet TAKE 1 TABLET EVERY DAY 90 tablet 2   Continuous Glucose Sensor (FREESTYLE LIBRE 2 SENSOR) MISC 1 Device by Does not apply route every 14 (fourteen) days. 6 each 3   fluticasone  (FLONASE ) 50 MCG/ACT nasal spray Place 2 sprays into both nostrils daily. 16 g 0   furosemide  (LASIX ) 40 MG tablet TAKE 1 TABLET (40 MG TOTAL) BY MOUTH DAILY. NEED APPT. 90 tablet 3   glucose blood (ACCU-CHEK AVIVA PLUS) test strip uad tid 100 each 12   insulin  glargine (LANTUS  SOLOSTAR) 100 UNIT/ML Solostar Pen Inject 10-14 Units into the skin daily. 15 mL 3   insulin  lispro (HUMALOG  KWIKPEN) 100 UNIT/ML KwikPen Inject 6-10 Units into the skin 2 (two) times daily before a meal. 30 mL 3   losartan  (COZAAR ) 100 MG tablet TAKE 1 TABLET EVERY DAY 90 tablet 3   Menthol , Topical Analgesic, 4 % GEL Apply topically.     Microlet Lancets MISC UAD to check sugars TID.  E11.22 270 each 3   nitroGLYCERIN  (NITROSTAT ) 0.4 MG SL tablet Place 1 tablet (0.4 mg total) under the tongue every 5 (five) minutes as needed for chest pain (up to 3 doses). 25 tablet 3   senna-docusate (SENOKOT-S) 8.6-50 MG per tablet Take 2 tablets by mouth 2 (two) times daily. For constipation.     Spacer/Aero-Holding Chambers DEVI 1 each by Does not apply route as needed. 1 each 0   No current facility-administered medications on file prior to visit.   Allergies  Allergen Reactions   Penicillins Anaphylaxis   Pneumococcal Vaccines Anaphylaxis   Shellfish Allergy Anaphylaxis   Barbiturates    Gabapentin      Urinary retention   Influenza Vaccines    Lisinopril  Cough   Other Nausea And Vomiting    Pt wife states that dye that is used for eye exam in dr    Sildenafil Other (See Comments) and Nausea And  Vomiting    Other reaction(s): Headache, Dizziness   Topiramate Other (See Comments)    Chest spasms and numbness   Zofran  [Ondansetron ]    Amlodipine  Other (See Comments)    LE edema   Metformin  Nausea And Vomiting   Tadalafil Other (See Comments)    dizziness   Vardenafil Other (See Comments)    headaches   Family History  Problem Relation Age of Onset   Cancer Mother    Liver cancer Brother    Colon cancer Neg Hx    Esophageal cancer Neg Hx    PE: BP 124/70   Pulse 69   Ht 5\' 11"  (1.803 m)   Wt 213 lb (96.6 kg)   SpO2 99%   BMI 29.71 kg/m  Wt Readings from Last 3 Encounters:  03/28/24 213 lb (96.6 kg)  03/09/24 213 lb 6.5 oz (96.8 kg)  02/08/24 213 lb 4.8 oz (96.8 kg)   Constitutional: overweight, in NAD, walks with a cane Eyes: EOMI, no exophthalmos ENT: no thyromegaly, no cervical lymphadenopathy Cardiovascular: RRR, No MRG Respiratory: CTA B Musculoskeletal: no deformities Skin: no rashes Neurological: + tremor with outstretched hands, + L sided paresis  ASSESSMENT: 1. DM2, insulin -dependent, uncontrolled, with complications - CAD - s/p BMS in 2001, and DES in 2005 - stroke - CKD - DR - ED - PN - Hypoglycemia-last in 2020  2. HL  PLAN:  1. Patient with longstanding, uncontrolled, type 2 diabetes, insulin -dependent, on basal-bolus insulin  regimen, previously on Humalog  50/50, but changed to Lantus  and Humalog  in 2024.  He was not able to use an SGLT2 inhibitor due to price and the fact that he did not qualify for patient assistance.  More recently, nephrology recommended an SGLT2 inhibitor but he declined. - He had an HbA1c 10/11/2023 at the [facility] and this was lower, at 7.9%.  At last visit, patient A1c continued to decrease, to 7.5%.  At that time, sugars appeared to be fluctuating mainly within the target range with occasional higher blood sugars after his 2 main meals of the day and with dropping sugars occasionally under 70s during the night and early  morning.  I advised him to reduce the Lantus  dose slightly if he continues to see lows.  We also discussed about trying to bolus Humalog  15 minutes before brunch and dinner and try to avoid bolusing after the meal.  However, I advised him to reduce the dose of Humalog  if he has to bolus after the meal, to avoid late postprandial hypoglycemia. CGM interpretation: -At today's visit, we reviewed his CGM downloads: It appears that 74% of values are in target range (goal >70%), while 25% are higher than 180 (goal <25%), and 1% are lower than 70 (goal <4%).  The calculated average blood sugar is 155.  The projected HbA1c for the next 3 months (GMI) is 7.0%. -Reviewing the CGM trends, sugars appear to be higher than 4, decreasing overnight, with occasional blood sugars lower than 70, and then increasing after his meals, particularly after dinner/snack at night.  Upon questioning, he started to drink sweet tea  since last visit.  The sugars could be increasing quite abruptly after this so we discussed about the absolute need to stop sweet tea or any sweet drink.  We will also decrease the dose of Lantus  slightly as he is dropping his blood sugars too much overnight.  We discussed about possibly moving into the morning but he feels that he benefits better from it when taken at night.  We also discussed about changing the dose of Humalog  as needed if the sugars remain uncontrolled after certain meals.  I also emphasized the need to inject 15 minutes before meal. - I suggested to:  Patient Instructions  Please change the regimen: - Lantus  10 units at bedtime - Humalog  6-10 units 15 min before brunch and dinner If you inject after the meal, only inject 4-6 units.  STOP SWEET TEA or ANY SWEET DRINK!   Please return in 3-4 months.  - we checked his HbA1c: 8.0% (higher) - advised to check sugars at different times of the day - 4x a day, rotating check times - advised for yearly eye exams >> he is UTD - return to  clinic in 3-4 months  2. HL - Reviewed latest lipid panel from 12/2023: All fractions at goal: Lab Results  Component Value Date   CHOL 113 01/18/2024   HDL 55.10 01/18/2024   LDLCALC 42 01/18/2024   LDLDIRECT 63.0 01/12/2016   TRIG 79.0 01/18/2024   CHOLHDL 2 01/18/2024  - Continues Lipitor 40 mg daily without side effects.  Emilie Harden, MD PhD [facility] Endocrinology

## 2024-05-16 ENCOUNTER — Encounter: Payer: Self-pay | Admitting: Podiatry

## 2024-05-16 ENCOUNTER — Ambulatory Visit (INDEPENDENT_AMBULATORY_CARE_PROVIDER_SITE_OTHER): Admitting: Podiatry

## 2024-05-16 DIAGNOSIS — M79674 Pain in right toe(s): Secondary | ICD-10-CM | POA: Diagnosis not present

## 2024-05-16 DIAGNOSIS — B351 Tinea unguium: Secondary | ICD-10-CM | POA: Diagnosis not present

## 2024-05-16 DIAGNOSIS — E1142 Type 2 diabetes mellitus with diabetic polyneuropathy: Secondary | ICD-10-CM

## 2024-05-16 DIAGNOSIS — M79675 Pain in left toe(s): Secondary | ICD-10-CM | POA: Diagnosis not present

## 2024-05-16 NOTE — Progress Notes (Signed)
 This patient returns to my office for at risk foot care.  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  Patient is taking plavix .  Patient has history of CVA.  This patient is unable to cut nails himself since the patient cannot reach his nails.These nails are painful walking and wearing shoes. Pain through arch and heel left foot. This patient presents for at risk foot care today.  General Appearance  Alert, conversant and in no acute stress.  Vascular  Dorsalis pedis and posterior tibial  pulses are weakly  palpable  bilaterally.  Capillary return is within normal limits  Bilaterally.Cold feet left.  .  Absent digital hair  B/L.  Neurologic  Senn-Weinstein monofilament wire test diminished  bilaterally. Muscle power within normal limits bilaterally.  Nails Thick disfigured discolored nails with subungual debris  from hallux to fifth toes bilaterally. No evidence of bacterial infection or drainage bilaterally.  Orthopedic  No limitations of motion  feet .  No crepitus or effusions noted.  No bony pathology or digital deformities noted. Plantar fasciitis left foot.  Skin  normotropic skin with no porokeratosis noted bilaterally.  No signs of infections or ulcers noted.  Midfoot DJD left..  Hammer toes  B/L.   Onychomycosis  Pain in right toes  Pain in left toes  Neuropathic pain.  Consent was obtained for treatment procedures.   Mechanical debridement of nails 1-5  bilaterally performed with a nail nipper.  Filed with dremel without incident.  Patient says he is still falling.  Patient was not seen by neurologist.  I examined his feet and there is a weakness in his left foot in plantarflexors.  Told him to make an appointment with Trish for possible AFO brace.   Return office visit   3 months                   Told patient to return for periodic foot care and evaluation due to potential at risk  complications.   Cordella Bold DPM

## 2024-06-05 LAB — MICROALBUMIN / CREATININE URINE RATIO: Microalb Creat Ratio: 523

## 2024-06-13 ENCOUNTER — Encounter (INDEPENDENT_AMBULATORY_CARE_PROVIDER_SITE_OTHER): Payer: Medicare HMO | Admitting: Ophthalmology

## 2024-06-20 ENCOUNTER — Ambulatory Visit

## 2024-06-20 DIAGNOSIS — I6330 Cerebral infarction due to thrombosis of unspecified cerebral artery: Secondary | ICD-10-CM

## 2024-06-20 NOTE — Progress Notes (Signed)
 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  Afo will increase toe- off and propulsion increasing gait and ADL's  Patient measured for Walk on AFO Anterior panel [phone] Per victoria @ [employer] [phone] does not require auth patient has no deduct or coins covered 100% Ref# [id number] / Financials signed   Order placed with [facility] today fitting scheduled 8/19 @10 :30 am .  Lolita Schultze Cped, CFo, CFm

## 2024-07-04 ENCOUNTER — Encounter (INDEPENDENT_AMBULATORY_CARE_PROVIDER_SITE_OTHER): Admitting: Ophthalmology

## 2024-07-04 ENCOUNTER — Encounter (INDEPENDENT_AMBULATORY_CARE_PROVIDER_SITE_OTHER): Payer: Self-pay

## 2024-07-09 ENCOUNTER — Encounter (INDEPENDENT_AMBULATORY_CARE_PROVIDER_SITE_OTHER): Admitting: Ophthalmology

## 2024-07-09 DIAGNOSIS — E113391 Type 2 diabetes mellitus with moderate nonproliferative diabetic retinopathy without macular edema, right eye: Secondary | ICD-10-CM | POA: Diagnosis not present

## 2024-07-09 DIAGNOSIS — Z794 Long term (current) use of insulin: Secondary | ICD-10-CM | POA: Diagnosis not present

## 2024-07-09 DIAGNOSIS — H35373 Puckering of macula, bilateral: Secondary | ICD-10-CM

## 2024-07-09 DIAGNOSIS — H2511 Age-related nuclear cataract, right eye: Secondary | ICD-10-CM

## 2024-07-09 DIAGNOSIS — E113312 Type 2 diabetes mellitus with moderate nonproliferative diabetic retinopathy with macular edema, left eye: Secondary | ICD-10-CM

## 2024-07-09 DIAGNOSIS — H35033 Hypertensive retinopathy, bilateral: Secondary | ICD-10-CM

## 2024-07-09 DIAGNOSIS — H43813 Vitreous degeneration, bilateral: Secondary | ICD-10-CM

## 2024-07-09 DIAGNOSIS — I10 Essential (primary) hypertension: Secondary | ICD-10-CM

## 2024-07-10 ENCOUNTER — Ambulatory Visit (INDEPENDENT_AMBULATORY_CARE_PROVIDER_SITE_OTHER)

## 2024-07-10 ENCOUNTER — Other Ambulatory Visit: Payer: Self-pay

## 2024-07-10 DIAGNOSIS — E1165 Type 2 diabetes mellitus with hyperglycemia: Secondary | ICD-10-CM

## 2024-07-10 DIAGNOSIS — I6330 Cerebral infarction due to thrombosis of unspecified cerebral artery: Secondary | ICD-10-CM | POA: Diagnosis not present

## 2024-07-10 DIAGNOSIS — M21372 Foot drop, left foot: Secondary | ICD-10-CM | POA: Diagnosis not present

## 2024-07-10 MED ORDER — INSULIN PEN NEEDLE 32G X 4 MM MISC: 3 refills | Status: AC

## 2024-07-12 NOTE — Progress Notes (Signed)
 Patient presents today to pick up AFO, diagnosed with Left foot drop post CVA   Orthosis was dispensed and fit was good patient was very happy with function. Brace adds support and stability to ankle and will also help to slow progression of ankle deformity.  Patient reviewed with me instructions for break-in and wear. Written instructions given to patient.  Patient will follow up as needed.   Lolita Schultze Cped, CFo, CFm

## 2024-07-15 ENCOUNTER — Other Ambulatory Visit (HOSPITAL_BASED_OUTPATIENT_CLINIC_OR_DEPARTMENT_OTHER): Payer: Self-pay | Admitting: Cardiovascular Disease

## 2024-07-25 ENCOUNTER — Encounter (INDEPENDENT_AMBULATORY_CARE_PROVIDER_SITE_OTHER): Admitting: Ophthalmology

## 2024-07-25 DIAGNOSIS — Z794 Long term (current) use of insulin: Secondary | ICD-10-CM

## 2024-07-25 DIAGNOSIS — E113312 Type 2 diabetes mellitus with moderate nonproliferative diabetic retinopathy with macular edema, left eye: Secondary | ICD-10-CM

## 2024-07-31 ENCOUNTER — Encounter: Payer: Self-pay | Admitting: Internal Medicine

## 2024-07-31 ENCOUNTER — Ambulatory Visit: Admitting: Internal Medicine

## 2024-07-31 VITALS — BP 138/72 | HR 71 | Ht 71.0 in | Wt 214.2 lb

## 2024-07-31 DIAGNOSIS — E7849 Other hyperlipidemia: Secondary | ICD-10-CM

## 2024-07-31 DIAGNOSIS — Z794 Long term (current) use of insulin: Secondary | ICD-10-CM | POA: Diagnosis not present

## 2024-07-31 DIAGNOSIS — E1159 Type 2 diabetes mellitus with other circulatory complications: Secondary | ICD-10-CM

## 2024-07-31 DIAGNOSIS — E1165 Type 2 diabetes mellitus with hyperglycemia: Secondary | ICD-10-CM

## 2024-07-31 LAB — POCT GLYCOSYLATED HEMOGLOBIN (HGB A1C): Hemoglobin A1C: 8.3 % — AB (ref 4.0–5.6)

## 2024-07-31 MED ORDER — LANTUS SOLOSTAR 100 UNIT/ML ~~LOC~~ SOPN: 7.0000 [IU] | PEN_INJECTOR | Freq: Every day | SUBCUTANEOUS | Status: DC

## 2024-07-31 NOTE — Addendum Note (Signed)
 Addended by: CLEOTILDE ROLIN RAMAN on: 07/31/2024 11:28 AM   Modules accepted: Orders

## 2024-07-31 NOTE — Patient Instructions (Addendum)
 Please continue: - Lantus  7 units at bedtime  Change: - Humalog  15 min before a meal: 8-12 units before brunch 8-10 units before dinner If you inject after the meal, only inject 4-6 units.  STOP SWEET TEA or ANY SWEET DRINKS!   Please return in 3-4 months.

## 2024-07-31 NOTE — Progress Notes (Signed)
 Patient ID: [name], male   DOB: 06/10/1951, 73 y.o.   MRN: [id number]  HPI: [name] is a 73 y.o.-year-old male, returning for follow-up for DM2, dx in 2007, insulin -dependent since 2011, uncontrolled, with complications (CAD, stroke, CKD, DR, ED, PN). Pt. previously saw Dr. Kassie, but last visit with me 4 months ago.  Interim history: + increased urination, no nausea, no chest pain.  He had cataract surgery since last visit. He had bleeding in the OS, had to have an injection in this eye. He now has blurry vision in the last OS. He is seeing his ophthalmologist tomorrow. He was no able to stop sweet tea since last visit.  Reviewed HbA1c: Lab Results  Component Value Date   HGBA1C 8.0 (A) 03/28/2024   HGBA1C 7.5 (A) 12/09/2023   HGBA1C 8.3 (H) 07/14/2023   HGBA1C 8.3 (A) 04/05/2023   HGBA1C 8.4 (A) 12/21/2022   HGBA1C 8.0 (A) 08/17/2022   HGBA1C 7.4 (A) 04/16/2022   HGBA1C 6.9 (A) 01/04/2022   HGBA1C 6.8 (A) 11/03/2021   HGBA1C 6.6 (A) 08/18/2021  10/11/2023 HbA1c 7.9% ([facility]) 04/22/2022: HbA1c 7.5%  Previously on: - Humalog  50/50 (prev. From PAP, now covered by the new insurance w/o a PA) 22 units after brunch >> moved to 15 minutes before brunch -  -stopped  in 12/2021 because feeling weird, sweaty He has significant tremors and cannot draw insulin  from vials.  Then on: - Humalog  50/50 24 >> 18-20 >> 22 units 15 min before brunch He tried  Farxiga  10 mg before b'fast >> too $$$, did not qualify for PAP  Currently on: - Lantus  14 >> 12 >> 10 >> 7 units at bedtime - Humalog  6-10 units 15 min before brunch and dinner - If you inject after the meal, only inject 4-6 units.  Pt checks his sugars >4x a day and they are ([facility]):  Previously:  Prev.:   Lowest sugar was 53 >> 54 >> 50s; he has hypoglycemia awareness at 70.  Highest sugar was 300s >> upper 200s >> 350.  Glucometer: Accu-Chek  She usually has 2 meals: Brunch (11-am -2 pm) and dinner  (6-8 pm).  - + CKD-sees nephrology, last BUN/creatinine:  02/15/2024: 33/1.97 Lab Results  Component Value Date   BUN 34 (H) 01/18/2024   BUN 27 (H) 12/14/2023   CREATININE 2.00 (H) 01/18/2024   CREATININE 2.21 (H) 12/14/2023   02/15/2024: ACR 61 Lab Results  Component Value Date   MICRALBCREAT 108.4 (H) 01/18/2024   MICRALBCREAT 85 02/26/2023  04/22/2022: ACR 33.7 On Cozaar  100 mg daily.  - + HL; last set of lipids: Lab Results  Component Value Date   CHOL 113 01/18/2024   HDL 55.10 01/18/2024   LDLCALC 42 01/18/2024   LDLDIRECT 63.0 01/12/2016   TRIG 79.0 01/18/2024   CHOLHDL 2 01/18/2024  10/11/2023 ([facility]):112/106/51.3/34 On Lipitor 40 mg daily.  - last eye exam was 08/11/2023 (Dr. Alvia): + DR. + Cataract Sx in 07/ and 06/2024.  On ASA 81.  - no numbness and tingling in his feet.  Last foot exam 05/16/2024 by Dr. Loreda.  TSH levels: 01/18/2024: TSH 2.2 10/11/2023: TSH 1.872 01/25/2023: TSH 2.61 04/22/2022: TSH 2.46  He was admitted with nausea/vomiting, CP - in 06/2023.  At that time, he was found to have cholelithiasis.  He had a slightly high lipase of 57 (11-51).    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   Chronic kidney disease    Constipation    takes Carafate  four times day   DIAB W/UNSPEC COMP TYPE II/UNSPEC TYPE UNCNTRL    ERECTILE DYSFUNCTION    GERD    HYPERLIPIDEMIA-MIXED    HYPERTENSION, BENIGN    LBBB (left bundle branch block) 02/02/2016   MORTON'S NEUROMA, RIGHT    OSA on CPAP 09/09/2020   Peripheral neuropathy    Seasonal allergies    takes Allegra and Benadryl  daily prn;uses Flonase  daily   SHOULDER PAIN, RIGHT    Snoring 09/09/2020   Stroke, 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, s/p CIR    TIA on medication 02/2012    Unstable angina (HCC) 07/09/2020   Past Surgical History:  Procedure Laterality Date   ANGIOPLASTY     COLONOSCOPY     CORONARY ANGIOPLASTY  2005   2 stents   CORONARY STENT INTERVENTION N/A 07/10/2020   Procedure: CORONARY STENT INTERVENTION;  Surgeon: Swaziland, Peter M, MD;  Location: [facility] INVASIVE [facility];  Service: Cardiovascular;  Laterality: N/A;   DENTAL SURGERY     LARYNGOPLASTY  08/07/2012   Procedure: LARYNGOPLASTY;  Surgeon: Ida Loader, MD;  Location: [facility] OR;  Service: ENT;  Laterality: Left;  Left Vocal Cord Medialyzation   LEFT HEART CATH AND CORONARY ANGIOGRAPHY N/A 07/10/2020   Procedure: LEFT HEART CATH AND CORONARY ANGIOGRAPHY;  Surgeon: Swaziland, Peter M, MD;  Location: [facility] INVASIVE CV LAB;  Service: Cardiovascular;  Laterality: N/A;   left knee surgury     x 2    stent  2001, 2004   coronary stents   Social History   Socioeconomic History   Marital status: Legally Separated    Spouse name: Not on file   Number of children: 3   Years of education: college   Highest education level: Not on file  Occupational History   Occupation: disabled    Employer: [employer]  Tobacco Use   Smoking status: Former    Current packs/day: 0.00    Average packs/day: 2.5 packs/day for 9.0 years (22.5 ttl pk-yrs)    Types: Cigarettes    Start date: 02/23/1974    Quit date: 02/24/1983    Years since quitting: 41.4   Smokeless tobacco: Never  Vaping Use   Vaping status: Never Used  Substance and Sexual Activity   Alcohol use: No    Alcohol/week: 0.0 standard drinks of alcohol   Drug use: No   Sexual activity: Never  Other Topics Concern   Not on file  Social History Narrative      Social Drivers of Health   Financial Resource Strain: Low Risk  (09/28/2023)   Overall Financial Resource Strain (CARDIA)    Difficulty of Paying Living Expenses: Not hard at all  Food Insecurity: No Food Insecurity (09/28/2023)   Hunger Vital Sign    Worried About Running Out of Food in the Last Year:  Never true    Ran Out of Food in the Last Year: Never true  Transportation Needs: No Transportation Needs (09/28/2023)   PRAPARE - Administrator, Civil Service (Medical): No    Lack of Transportation (Non-Medical): No  Physical Activity: Inactive (09/28/2023)   Exercise Vital Sign    Days of Exercise per Week: 0 days  Minutes of Exercise per Session: 0 min  Stress: No Stress Concern Present (09/28/2023)   [employer] of Occupational Health - Occupational Stress Questionnaire    Feeling of Stress : Not at all  Social Connections: Socially Isolated (09/28/2023)   Social Connection and Isolation Panel    Frequency of Communication with Friends and Family: Three times a week    Frequency of Social Gatherings with Friends and Family: Three times a week    Attends Religious Services: Never    Active Member of Clubs or Organizations: No    Attends Banker Meetings: Never    Marital Status: Separated  Intimate Partner Violence: Not At Risk (09/28/2023)   Humiliation, Afraid, Rape, and Kick questionnaire    Fear of Current or Ex-Partner: No    Emotionally Abused: No    Physically Abused: No    Sexually Abused: No   Current Outpatient Medications on File Prior to Visit  Medication Sig Dispense Refill   aspirin  EC 81 MG tablet Take 1 tablet (81 mg total) by mouth daily. Swallow whole. 30 tablet 11   atorvastatin  (LIPITOR) 40 MG tablet TAKE 1 TABLET EVERY DAY 90 tablet 3   Blood Pressure Monitoring (BLOOD PRESSURE CUFF) MISC 1 Units by Does not apply route daily. 1 each 0   budesonide -formoterol  (SYMBICORT ) 80-4.5 MCG/ACT inhaler 2 puffs BID and prn shortness of breath or wheezing 1 each 0   calcium  carbonate (TUMS - DOSED IN MG ELEMENTAL CALCIUM ) 500 MG chewable tablet Chew 1 tablet by mouth daily as needed for indigestion or heartburn.     carvedilol  (COREG ) 25 MG tablet TAKE 1 TABLET TWICE DAILY 180 tablet 3   chlorthalidone  (HYGROTON ) 25 MG tablet TAKE 1 TABLET  EVERY DAY 90 tablet 3   clopidogrel  (PLAVIX ) 75 MG tablet TAKE 1 TABLET EVERY DAY 90 tablet 1   Continuous Glucose Sensor (FREESTYLE LIBRE 2 SENSOR) MISC 1 Device by Does not apply route every 14 (fourteen) days. 6 each 3   fluticasone  (FLONASE ) 50 MCG/ACT nasal spray Place 2 sprays into both nostrils daily. 16 g 0   furosemide  (LASIX ) 40 MG tablet TAKE 1 TABLET (40 MG TOTAL) BY MOUTH DAILY. NEED APPT. 90 tablet 3   glucose blood (ACCU-CHEK AVIVA PLUS) test strip uad tid 100 each 12   insulin  glargine (LANTUS  SOLOSTAR) 100 UNIT/ML Solostar Pen Inject 10-14 Units into the skin daily. 15 mL 3   insulin  lispro (HUMALOG  KWIKPEN) 100 UNIT/ML KwikPen Inject 6-10 Units into the skin 2 (two) times daily before a meal. 30 mL 3   Insulin  Pen Needle 32G X 4 MM MISC Use 4x a day 300 each 3   losartan  (COZAAR ) 100 MG tablet TAKE 1 TABLET EVERY DAY 90 tablet 3   Menthol , Topical Analgesic, 4 % GEL Apply topically.     Microlet Lancets MISC UAD to check sugars TID.  E11.22 270 each 3   nitroGLYCERIN  (NITROSTAT ) 0.4 MG SL tablet Place 1 tablet (0.4 mg total) under the tongue every 5 (five) minutes as needed for chest pain (up to 3 doses). 25 tablet 3   senna-docusate (SENOKOT-S) 8.6-50 MG per tablet Take 2 tablets by mouth 2 (two) times daily. For constipation.     Spacer/Aero-Holding Chambers DEVI 1 each by Does not apply route as needed. 1 each 0   No current facility-administered medications on file prior to visit.   Allergies  Allergen Reactions   Penicillins Anaphylaxis   Pneumococcal Vaccines Anaphylaxis   Shellfish Allergy  Anaphylaxis   Barbiturates    Gabapentin      Urinary retention   Influenza Vaccines    Lisinopril  Cough   Other Nausea And Vomiting    Pt wife states that dye that is used for eye exam in dr    Sildenafil Other (See Comments) and Nausea And Vomiting    Other reaction(s): Headache, Dizziness   Topiramate Other (See Comments)    Chest spasms and numbness   Zofran   [Ondansetron ]    Amlodipine  Other (See Comments)    LE edema   Metformin  Nausea And Vomiting   Tadalafil Other (See Comments)    dizziness   Vardenafil Other (See Comments)    headaches   Family History  Problem Relation Age of Onset   Cancer Mother    Liver cancer Brother    Colon cancer Neg Hx    Esophageal cancer Neg Hx    PE: BP 138/72   Pulse 71   Ht 5' 11 (1.803 m)   Wt 214 lb 3.2 oz (97.2 kg)   SpO2 99%   BMI 29.87 kg/m  Wt Readings from Last 3 Encounters:  07/31/24 214 lb 3.2 oz (97.2 kg)  03/28/24 213 lb (96.6 kg)  03/09/24 213 lb 6.5 oz (96.8 kg)   Constitutional: overweight, in NAD, walks with a cane Eyes: EOMI, no exophthalmos ENT: no thyromegaly, no cervical lymphadenopathy Cardiovascular: RRR, No MRG Respiratory: CTA B Musculoskeletal: no deformities Skin: no rashes Neurological: + tremor with outstretched hands, + L sided paresis  ASSESSMENT: 1. DM2, insulin -dependent, uncontrolled, with complications - CAD - s/p BMS in 2001, and DES in 2005 - stroke - CKD - DR - ED - PN - Hypoglycemia-last in 2020  2. HL  PLAN:  1. Patient with longstanding, uncontrolled, type 2 diabetes, on basal-bolus insulin  regimen, changed from a premixed insulin  regimen to Lantus  and Humalog  in 2024.  He was not able to use an SGLT2 inhibitor due to price and he did not qualify for patient assistance.  More recently, nephrology recommended an SGLT2 inhibitor but he declined. -At last visit sugars were higher than before, decreasing at night, with occasional blood sugars lower than 70, then increasing after his meals, particularly after dinner/snack at night.  Upon questioning, he started to drink sweet tea and sugars were increasing abruptly after this.  We discussed about the absolute need to stop sweet tea or any other sweet drinks.  We also decreased the dose of Lantus  slightly and we discussed about changing the dose of Humalog  as needed depending on the blood sugars  after meals.  I also emphasized the need to inject 50 minutes before meal.  HbA1c at last visit was 8.0%, higher. CGM interpretation: -At today's visit, we reviewed his CGM downloads: It appears that 81% of values are in target range (goal >70%), while 19% are higher than 180 (goal <25%), and 0% are lower than 70 (goal <4%).  The calculated average blood sugar is 143.  The projected HbA1c for the next 3 months (GMI) is 6.7%. -Reviewing the CGM trends, sugars appear to be significantly improved in the evening and over night after he started to take Humalog  15 minutes before his dinners.  He was able to reduce his Lantus  dose from 10 units to 7 units at night.  Sugars are still increasing after brunch/lunch, possibly due to the fact that he was not able to stop sweet tea since last visit.  Also, he may forget his midday insulin  dose.  I  again advised him to stop but we also discussed that he may need to use a higher dose of Humalog  before lunch especially if he has more carbs with this meal. -His HbA1c today is definitely higher than expected from the CGM and I believe that his CGM predicted A1c is more accurate for him. - I suggested to:  Patient Instructions  Please continue: - Lantus  7 units at bedtime  Change: - Humalog  15 min before a meal: 8-12 units before brunch 8-10 units before dinner If you inject after the meal, only inject 4-6 units.  STOP SWEET TEA or ANY SWEET DRINKS!   Please return in 3-4 months.  - we checked his HbA1c: 8.3% (higher, and higher than expected from the CGM data)  - advised to check sugars at different times of the day - 4x a day, rotating check times - advised for yearly eye exams >> he is UTD - return to clinic in 3-4 months  2. HL - Reviewed latest lipid panel from 12/2023: All fractions at goal: Lab Results  Component Value Date   CHOL 113 01/18/2024   HDL 55.10 01/18/2024   LDLCALC 42 01/18/2024   LDLDIRECT 63.0 01/12/2016   TRIG 79.0 01/18/2024    CHOLHDL 2 01/18/2024  - He continues on Lipitor 40 mg daily without side effects  Lela Fendt, MD PhD [facility]

## 2024-08-06 ENCOUNTER — Encounter (INDEPENDENT_AMBULATORY_CARE_PROVIDER_SITE_OTHER): Admitting: Ophthalmology

## 2024-08-06 DIAGNOSIS — E113312 Type 2 diabetes mellitus with moderate nonproliferative diabetic retinopathy with macular edema, left eye: Secondary | ICD-10-CM | POA: Diagnosis not present

## 2024-08-06 DIAGNOSIS — Z794 Long term (current) use of insulin: Secondary | ICD-10-CM

## 2024-08-06 DIAGNOSIS — I10 Essential (primary) hypertension: Secondary | ICD-10-CM | POA: Diagnosis not present

## 2024-08-06 DIAGNOSIS — H43812 Vitreous degeneration, left eye: Secondary | ICD-10-CM

## 2024-08-06 DIAGNOSIS — H35032 Hypertensive retinopathy, left eye: Secondary | ICD-10-CM | POA: Diagnosis not present

## 2024-08-13 ENCOUNTER — Encounter (INDEPENDENT_AMBULATORY_CARE_PROVIDER_SITE_OTHER): Admitting: Ophthalmology

## 2024-08-13 DIAGNOSIS — H35032 Hypertensive retinopathy, left eye: Secondary | ICD-10-CM

## 2024-08-13 DIAGNOSIS — I10 Essential (primary) hypertension: Secondary | ICD-10-CM

## 2024-08-13 DIAGNOSIS — E113312 Type 2 diabetes mellitus with moderate nonproliferative diabetic retinopathy with macular edema, left eye: Secondary | ICD-10-CM

## 2024-08-13 DIAGNOSIS — H43812 Vitreous degeneration, left eye: Secondary | ICD-10-CM

## 2024-08-13 DIAGNOSIS — Z794 Long term (current) use of insulin: Secondary | ICD-10-CM | POA: Diagnosis not present

## 2024-08-13 DIAGNOSIS — H59033 Cystoid macular edema following cataract surgery, bilateral: Secondary | ICD-10-CM | POA: Diagnosis not present

## 2024-08-15 ENCOUNTER — Encounter: Payer: Self-pay | Admitting: Podiatry

## 2024-08-15 ENCOUNTER — Ambulatory Visit (INDEPENDENT_AMBULATORY_CARE_PROVIDER_SITE_OTHER): Admitting: Podiatry

## 2024-08-15 DIAGNOSIS — M79674 Pain in right toe(s): Secondary | ICD-10-CM

## 2024-08-15 DIAGNOSIS — M79675 Pain in left toe(s): Secondary | ICD-10-CM

## 2024-08-15 DIAGNOSIS — B351 Tinea unguium: Secondary | ICD-10-CM | POA: Diagnosis not present

## 2024-08-15 DIAGNOSIS — E1142 Type 2 diabetes mellitus with diabetic polyneuropathy: Secondary | ICD-10-CM

## 2024-08-15 NOTE — Progress Notes (Signed)
 This patient returns to my office for at risk foot care.  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  Patient is taking plavix .  Patient has history of CVA.  This patient is unable to cut nails himself since the patient cannot reach his nails.These nails are painful walking and wearing shoes.  This patient presents for at risk foot care today.  General Appearance  Alert, conversant and in no acute stress.  Vascular  Dorsalis pedis and posterior tibial  pulses are weakly  palpable  bilaterally.  Capillary return is within normal limits  Bilaterally.Cold feet left.  .  Absent digital hair  B/L.  Neurologic  Senn-Weinstein monofilament wire test diminished  bilaterally. Muscle power within normal limits bilaterally.  Nails Thick disfigured discolored nails with subungual debris  from hallux to fifth toes bilaterally. No evidence of bacterial infection or drainage bilaterally.  Orthopedic  No limitations of motion  feet .  No crepitus or effusions noted.  No bony pathology or digital deformities noted. Plantar fasciitis left foot.  Skin  normotropic skin with no porokeratosis noted bilaterally.  No signs of infections or ulcers noted.  Midfoot DJD left..  Hammer toes  B/L.   Onychomycosis  Pain in right toes  Pain in left toes  Neuropathic pain.  Consent was obtained for treatment procedures.   Mechanical debridement of nails 1-5  bilaterally performed with a nail nipper.  Filed with dremel without incident.     Return office visit   4  months                   Told patient to return for periodic foot care and evaluation due to potential at risk complications.   Cordella Bold DPM

## 2024-08-22 ENCOUNTER — Encounter (INDEPENDENT_AMBULATORY_CARE_PROVIDER_SITE_OTHER): Admitting: Ophthalmology

## 2024-08-29 ENCOUNTER — Other Ambulatory Visit (HOSPITAL_BASED_OUTPATIENT_CLINIC_OR_DEPARTMENT_OTHER): Payer: Self-pay | Admitting: Cardiovascular Disease

## 2024-08-29 DIAGNOSIS — I10 Essential (primary) hypertension: Secondary | ICD-10-CM

## 2024-09-11 ENCOUNTER — Encounter (INDEPENDENT_AMBULATORY_CARE_PROVIDER_SITE_OTHER): Admitting: Ophthalmology

## 2024-09-11 DIAGNOSIS — E113312 Type 2 diabetes mellitus with moderate nonproliferative diabetic retinopathy with macular edema, left eye: Secondary | ICD-10-CM

## 2024-09-11 DIAGNOSIS — H43812 Vitreous degeneration, left eye: Secondary | ICD-10-CM

## 2024-09-11 DIAGNOSIS — I10 Essential (primary) hypertension: Secondary | ICD-10-CM

## 2024-09-11 DIAGNOSIS — Z794 Long term (current) use of insulin: Secondary | ICD-10-CM | POA: Diagnosis not present

## 2024-09-11 DIAGNOSIS — H35032 Hypertensive retinopathy, left eye: Secondary | ICD-10-CM

## 2024-09-11 DIAGNOSIS — H59032 Cystoid macular edema following cataract surgery, left eye: Secondary | ICD-10-CM

## 2024-09-15 ENCOUNTER — Other Ambulatory Visit (HOSPITAL_BASED_OUTPATIENT_CLINIC_OR_DEPARTMENT_OTHER): Payer: Self-pay | Admitting: Cardiovascular Disease

## 2024-10-01 ENCOUNTER — Ambulatory Visit

## 2024-10-01 VITALS — BP 130/80 | HR 67 | Ht 70.0 in | Wt 216.6 lb

## 2024-10-01 DIAGNOSIS — Z0000 Encounter for general adult medical examination without abnormal findings: Secondary | ICD-10-CM

## 2024-10-01 NOTE — Patient Instructions (Addendum)
 Mr. Wendorf,  Thank you for taking the time for your Medicare Wellness Visit. I appreciate your continued commitment to your health goals. Please review the care plan we discussed, and feel free to reach out if I can assist you further.  Please note that Annual Wellness Visits do not include a physical exam. Some assessments may be limited, especially if the visit was conducted virtually. If needed, we may recommend an in-person follow-up with your provider.  Ongoing Care Seeing your primary care provider every 3 to 6 months helps us  monitor your health and provide consistent, personalized care.   Referrals If a referral was made during today's visit and you haven't received any updates within two weeks, please contact the referred provider directly to check on the status.  Recommended Screenings:  Health Maintenance  Topic Date Due   COVID-19 Vaccine (5 - 2025-26 season) 07/23/2024   Zoster (Shingles) Vaccine (1 of 2) 01/01/2025*   Yearly kidney function blood test for diabetes  01/17/2025   Hemoglobin A1C  01/28/2025   Yearly kidney health urinalysis for diabetes  06/05/2025   Eye exam for diabetics  06/29/2025   Complete foot exam   08/15/2025   Medicare Annual Wellness Visit  10/01/2025   DTaP/Tdap/Td vaccine (5 - Td or Tdap) 04/22/2032   Colon Cancer Screening  04/25/2033   Hepatitis C Screening  Completed   Meningitis B Vaccine  Aged Out   Pneumococcal Vaccine for age over 34  Discontinued   Cologuard (Stool DNA test)  Discontinued  *Topic was postponed. The date shown is not the original due date.       10/01/2024   10:35 AM  Advanced Directives  Does Patient Have a Medical Advance Directive? No  Would patient like information on creating a medical advance directive? No - Patient declined    Vision: Annual vision screenings are recommended for early detection of glaucoma, cataracts, and diabetic retinopathy. These exams can also reveal signs of chronic conditions such  as diabetes and high blood pressure.  Dental: Annual dental screenings help detect early signs of oral cancer, gum disease, and other conditions linked to overall health, including heart disease and diabetes.

## 2024-10-01 NOTE — Progress Notes (Signed)
 Subjective:   [name] is a 73 y.o. male who presents for a Medicare Annual Wellness Visit.  I have recommended that this patient have a immunization for Influenza and Shingles but he declines at this time. I have discussed the risks and benefits of this procedure with him. The patient verbalizes understanding.   Allergies (verified) Penicillins, Pneumococcal vaccines, Shellfish allergy, Barbiturates, Gabapentin , Influenza vaccines, Lisinopril , Other, Sildenafil, Topiramate, Zofran  [ondansetron ], Amlodipine , Metformin , Tadalafil, and Vardenafil   History: Past Medical History:  Diagnosis Date   ALLERGIC RHINITIS    CAD, NATIVE VESSEL    BMS to OM1 2001, DES to BMS 2005   Chronic back pain    herniated disc   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   Chronic kidney disease    Constipation    takes Carafate  four times day   DIAB W/UNSPEC COMP TYPE II/UNSPEC TYPE UNCNTRL    ERECTILE DYSFUNCTION    GERD    HYPERLIPIDEMIA-MIXED    HYPERTENSION, BENIGN    LBBB (left bundle branch block) 02/02/2016   MORTON'S NEUROMA, RIGHT    OSA on CPAP 09/09/2020   Peripheral neuropathy    Seasonal allergies    takes Allegra and Benadryl  daily prn;uses Flonase  daily   SHOULDER PAIN, RIGHT    Snoring 09/09/2020   Stroke, 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, s/p CIR    TIA on medication 02/2012   Unstable angina (HCC) 07/09/2020   Past Surgical History:  Procedure Laterality Date   ANGIOPLASTY     COLONOSCOPY     CORONARY ANGIOPLASTY  2005   2 stents   CORONARY STENT INTERVENTION N/A 07/10/2020   Procedure: CORONARY STENT INTERVENTION;  Surgeon: Jordan, Peter M, MD;  Location: [facility] INVASIVE CV LAB;  Service: Cardiovascular;  Laterality: N/A;   DENTAL SURGERY     LARYNGOPLASTY  08/07/2012   Procedure: LARYNGOPLASTY;  Surgeon: Ida Loader, MD;  Location: [facility] OR;  Service: ENT;  Laterality: Left;  Left Vocal Cord Medialyzation   LEFT HEART CATH  AND CORONARY ANGIOGRAPHY N/A 07/10/2020   Procedure: LEFT HEART CATH AND CORONARY ANGIOGRAPHY;  Surgeon: Jordan, Peter M, MD;  Location: [facility] INVASIVE CV LAB;  Service: Cardiovascular;  Laterality: N/A;   left knee surgury     x 2    stent  2001, 2004   coronary stents   Family History  Problem Relation Age of Onset   Cancer Mother    Liver cancer Brother    Colon cancer Neg Hx    Esophageal cancer Neg Hx    Social History   Occupational History   Occupation: disabled    Employer: [employer]  Tobacco Use   Smoking status: Former    Current packs/day: 0.00    Average packs/day: 2.5 packs/day for 9.0 years (22.5 ttl pk-yrs)    Types: Cigarettes    Start date: 02/23/1974    Quit date: 02/24/1983    Years since quitting: 41.6   Smokeless tobacco: Never  Vaping Use   Vaping status: Never Used  Substance and Sexual Activity   Alcohol use: No    Alcohol/week: 0.0 standard drinks of alcohol   Drug use: No   Sexual activity: Not Currently   Tobacco Counseling Counseling given: Not Answered  SDOH Screenings   Food Insecurity: No Food Insecurity (10/01/2024)  Housing: Unknown (10/01/2024)  Transportation Needs: No Transportation Needs (10/01/2024)  Utilities: Not At Risk (10/01/2024)  Alcohol Screen: Low Risk  (03/05/2022)  Depression (PHQ2-9):  Low Risk  (10/01/2024)  Financial Resource Strain: Low Risk  (09/28/2023)  Physical Activity: Insufficiently Active (10/01/2024)  Social Connections: Socially Isolated (10/01/2024)  Stress: No Stress Concern Present (10/01/2024)  Tobacco Use: Medium Risk (10/01/2024)  Health Literacy: Adequate Health Literacy (10/01/2024)   Depression Screen    10/01/2024   10:34 AM 09/28/2023    3:18 PM 03/04/2023    3:39 PM 03/08/2022   11:24 AM 03/05/2022    1:16 PM 03/05/2022    1:11 PM 01/28/2021    9:34 AM  PHQ 2/9 Scores  PHQ - 2 Score 0 0 0 0 0 0 0  PHQ- 9 Score 0 1  3          Data saved with a previous flowsheet row definition     Goals  Addressed               This Visit's Progress     Patient Stated (pt-stated)        Patient stated he plans to continue walking and monitoring blood sugar readings       Visit info / Clinical Intake: Medicare Wellness Visit Type:: Subsequent Annual Wellness Visit Medicare Wellness Visit Mode:: In-person (required for WTM) Information given by:: patient Interpreter Needed?: No Pre-visit prep was completed: yes AWV questionnaire completed by patient prior to visit?: no Living arrangements:: with family/others (lives with daughter/grandkids) Patient's Overall Health Status Rating: good Typical amount of pain: none Does pain affect daily life?: no Are you currently prescribed opioids?: no  Dietary Habits and Nutritional Risks How many meals a day?: 3 Eats fruit and vegetables daily?: yes Most meals are obtained by: preparing own meals; having others provide food In the last 2 weeks, have you had any of the following?: -- (none) Diabetic:: (!) yes Any non-healing wounds?: no How often do you check your BS?: as needed; 4 (fasting - 137) Would you like to be referred to a Nutritionist or for Diabetic Management? : no  Functional Status Activities of Daily Living (to include ambulation/medication): Independent Ambulation: Independent with device- listed below Home Assistive Devices/Equipment: Cane; Dentures (specify type) Medication Administration: Independent Home Management: Independent Manage your own finances?: yes Primary transportation is: driving Concerns about vision?: no *vision screening is required for WTM* Concerns about hearing?: no  Fall Screening Falls in the past year?: 0 Number of falls in past year: 0 Was there an injury with Fall?: 0 Fall Risk Category Calculator: 0 Patient Fall Risk Level: Low Fall Risk  Fall Risk Patient at Risk for Falls Due to: Impaired balance/gait Fall risk Follow up: Falls evaluation completed; Falls prevention  discussed  Home and Transportation Safety: All rugs have non-skid backing?: yes All stairs or steps have railings?: yes Grab bars in the bathtub or shower?: yes Have non-skid surface in bathtub or shower?: yes Good home lighting?: yes Regular seat belt use?: yes Hospital stays in the last year:: no  Cognitive Assessment Difficulty concentrating, remembering, or making decisions? : yes Will 6CIT or Mini Cog be Completed: yes What year is it?: 0 points What month is it?: 0 points Give patient an address phrase to remember (5 components): [address] [place], [place] About what time is it?: 0 points Count backwards from 20 to 1: 0 points Say the months of the year in reverse: 0 points  Advance Directives (For Healthcare) Does Patient Have a Medical Advance Directive?: No Would patient like information on creating a medical advance directive?: No - Patient declined  Reviewed/Updated  Reviewed/Updated: All        Objective:    Today's Vitals   10/01/24 1032  BP: 130/80  Pulse: 67  SpO2: 98%  Weight: 216 lb 9.6 oz (98.2 kg)  Height: 5' 10 (1.778 m)   Body mass index is 31.08 kg/m.  Current Medications (verified) Outpatient Encounter Medications as of 10/01/2024  Medication Sig   aspirin  EC 81 MG tablet Take 1 tablet (81 mg total) by mouth daily. Swallow whole.   atorvastatin  (LIPITOR) 40 MG tablet TAKE 1 TABLET EVERY DAY   Blood Pressure Monitoring (BLOOD PRESSURE CUFF) MISC 1 Units by Does not apply route daily.   budesonide -formoterol  (SYMBICORT ) 80-4.5 MCG/ACT inhaler 2 puffs BID and prn shortness of breath or wheezing   calcium  carbonate (TUMS - DOSED IN MG ELEMENTAL CALCIUM ) 500 MG chewable tablet Chew 1 tablet by mouth daily as needed for indigestion or heartburn.   carvedilol  (COREG ) 25 MG tablet TAKE 1 TABLET TWICE DAILY   chlorthalidone  (HYGROTON ) 25 MG tablet Take 1 tablet (25 mg total) by mouth daily.   clopidogrel  (PLAVIX ) 75 MG tablet TAKE 1 TABLET EVERY  DAY   dextrose  (GLUCO TO GO 15) 40 % GEL Take 1 Tube by mouth once as needed for low blood sugar.   fluticasone  (FLONASE ) 50 MCG/ACT nasal spray Place 2 sprays into both nostrils daily.   furosemide  (LASIX ) 40 MG tablet TAKE 1 TABLET (40 MG TOTAL) BY MOUTH DAILY. NEED APPT.   glucose blood (ACCU-CHEK AVIVA PLUS) test strip uad tid   insulin  glargine (LANTUS  SOLOSTAR) 100 UNIT/ML Solostar Pen Inject 7-8 Units into the skin daily.   insulin  lispro (HUMALOG  KWIKPEN) 100 UNIT/ML KwikPen Inject 6-10 Units into the skin 2 (two) times daily before a meal.   Insulin  Pen Needle 32G X 4 MM MISC Use 4x a day   losartan  (COZAAR ) 100 MG tablet TAKE 1 TABLET EVERY DAY   Menthol , Topical Analgesic, 4 % GEL Apply topically.   Microlet Lancets MISC UAD to check sugars TID.  E11.22   nitroGLYCERIN  (NITROSTAT ) 0.4 MG SL tablet Place 1 tablet (0.4 mg total) under the tongue every 5 (five) minutes as needed for chest pain (up to 3 doses).   senna-docusate (SENOKOT-S) 8.6-50 MG per tablet Take 2 tablets by mouth 2 (two) times daily. For constipation.   Spacer/Aero-Holding Chambers DEVI 1 each by Does not apply route as needed.   No facility-administered encounter medications on file as of 10/01/2024.   Hearing/Vision screen Hearing Screening - Comments:: Denies hearing difficulties   Vision Screening - Comments:: Wears rx glasses - up to date with routine eye exams with Dr Caresse Immunizations and Health Maintenance Health Maintenance  Topic Date Due   COVID-19 Vaccine (5 - 2025-26 season) 07/23/2024   Zoster Vaccines- Shingrix (1 of 2) 01/01/2025 (Originally 11/11/1999)   Diabetic kidney evaluation - eGFR measurement  01/17/2025   HEMOGLOBIN A1C  01/28/2025   Diabetic kidney evaluation - Urine ACR  06/05/2025   OPHTHALMOLOGY EXAM  06/29/2025   FOOT EXAM  08/15/2025   Medicare Annual Wellness (AWV)  10/01/2025   DTaP/Tdap/Td (5 - Td or Tdap) 04/22/2032   Colonoscopy  04/25/2033   Hepatitis C Screening   Completed   Meningococcal B Vaccine  Aged Out   Pneumococcal Vaccine: 50+ Years  Discontinued   Fecal DNA (Cologuard)  Discontinued        Assessment/Plan:  This is a routine wellness examination for [employer].  Patient Care Team: Geofm Glade PARAS, MD as  PCP - General (Internal Medicine) Raford Riggs, MD as PCP - Cardiology (Cardiology) Nahser, Aleene PARAS, MD (Inactive) (Cardiology) Jesus Oliphant, MD (Otolaryngology) Rosemarie Eather RAMAN, MD (Neurology) Gaspar Kung, MD (Orthopedic Surgery) Jakie Alm SAUNDERS, MD (Gastroenterology) Hosie Lares, MD as Consulting Physician (Neurology) Alvia Norleen BIRCH, MD as Consulting Physician (Ophthalmology) Caresse Cough, MD as Referring Physician (Ophthalmology)  I have personally reviewed and noted the following in the patient's chart:   Medical and social history Use of alcohol, tobacco or illicit drugs  Current medications and supplements including opioid prescriptions. Functional ability and status Nutritional status Physical activity Advanced directives List of other physicians Hospitalizations, surgeries, and ER visits in previous 12 months Vitals Screenings to include cognitive, depression, and falls Referrals and appointments  No orders of the defined types were placed in this encounter.  In addition, I have reviewed and discussed with patient certain preventive protocols, quality metrics, and best practice recommendations. A written personalized care plan for preventive services as well as general preventive health recommendations were provided to patient.   Verdie CHRISTELLA Saba, CMA   10/01/2024   Return in 1 year (on 10/01/2025).  After Visit Summary: (In Person-Declined) Patient declined AVS at this time.  Nurse Notes: Scheduled CPE w/PCP for 02/2025.  Pt is evaluated at the [facility] q6 mths.  Plans to obtain recent labwork results (Hep C) from the [facility] and bring to next appt for review.

## 2024-10-29 NOTE — Progress Notes (Signed)
 Ophthalmology Department Clinical Visit Note     CHIEF COMPLAINT Patient presents for Procedure Visit (His va has improved a lot. His main problem is waking up with goofy stuff in the mornings and some days he tears uncontrollably. Not taking any drops. )   HISTORY OF PRESENT ILLNESS: [name] is a 73 y.o. male who presents to the clinic today for a procedure, YAG OS 10/31/2024. The patient is s/p phaco OD 07/17/2024 and s/p phaco OS 06/12/2024. Referred by Dr. Alvia.   [name] is amenable and ready to proceed with YAG OS procedure.  Of note, pt is diabetic. Last A1c was 8.0.  HPI     Procedure Visit   Planned Procedure:: YAG OS 10/31/2024. Additional comments: His va has improved a lot. His main problem is waking up with goofy stuff in the mornings and some days he tears uncontrollably. Not taking any drops.       Last edited by [name], OA on 10/31/2024  9:40 AM.     HISTORICAL INFORMATION:   CURRENT MEDICATIONS: Current Outpatient Medications (Ophthalmic Drugs)  Medication Sig   moxifloxacin (VIGAMOX) 0.5 % ophthalmic solution Administer 1 drop into the right eye 4 (four) times a day. (Patient not taking: Reported on 08/01/2024)   prednisoLONE acetate (PRED FORTE) 1 % ophthalmic suspension Administer 1 drop into the right eye 4 (four) times a day. (Patient taking differently: Administer 1 drop into the right eye daily.)   No current facility-administered medications for this visit. (Ophthalmic Drugs)   Current Outpatient Medications (Other)  Medication Sig   aspirin  81 mg EC tablet Take 81 mg by mouth daily.   atorvastatin  (LIPITOR) 40 mg tablet Take 40 mg by mouth daily.   calcium  carbonate (TUMS) 500 mg (200 mg calcium ) chewable tablet Chew 1 tablet daily as needed for heartburn.   carvediloL  (COREG ) 25 mg tablet Take 25 mg by mouth 2 (two) times a day with meals.   chlorthalidone  (HYGROTON ) 25 mg tablet Take 25 mg by mouth daily.    clopidogreL  (PLAVIX ) 75 mg tablet Take 75 mg by mouth daily.   fluticasone  propionate (FLONASE ) 50 mcg/spray nasal spray Administer 2 sprays into each nostril daily as needed for allergies.   insulin  glargine (LANTUS  SoloStar) 100 unit/mL (3 mL) pen Inject 10 Units under the skin nightly.   insulin  lispro (HumaLOG ) 100 unit/mL injection Inject 6-10 Units under the skin before breakfast and before evening meal.   losartan  (COZAAR ) 100 mg tablet Take 100 mg by mouth daily.   nitroglycerin  (NITROSTAT ) 0.4 mg SL tablet Place 0.4 mg under the tongue every 5 (five) minutes as needed for chest pain.   sennosides-docusate sodium  (PERICOLACE) 8.6-50 mg per tablet Take 2 tablets by mouth 2 (two) times a day.   No current facility-administered medications for this visit. (Other)    ALLERGIES Allergies  Allergen Reactions   Influenza Virus Vaccines Anaphylaxis   Penicillins Swelling   Pneumococcal Vaccine Anaphylaxis   Shellfish Containing Products Anaphylaxis   Barbiturates Other (See Comments)    Unknown reaction   Gabapentin  Other (See Comments)    Urinary retention   Lisinopril  Cough   Ondansetron  Other (See Comments)    Unknown reaction   Sildenafil Dizziness    Headache   Topiramate Other (See Comments)    Chest spasms and numbness   Amlodipine  Other (See Comments)    LE edema   Metformin  GI Intolerance   Tadalafil Dizziness   Vardenafil Other (See Comments)  headaches    PAST MEDICAL HISTORY Past Medical History:  Diagnosis Date   Anemia of chronic disease 02/16/2017   CKD (chronic kidney disease) stage IIIb    Combined forms of age-related cataract of both eyes 03/07/2024   Coronary artery disease 05/30/2024   s/p multiple PCI   Diabetes mellitus Type 2    on insulin . per patient, AM glucose avg 80-120   Diabetic peripheral neuropathy (CMD) 12/25/2020   Podiatry q 3 months     Diastolic dysfunction    Essential hypertension 03/12/2009   C/O  LE edema on Amlodipine      Hypercholesterolemia    Hyperlipidemia 03/07/2009   Hypertension    LBBB (left bundle branch block)    PONV (postoperative nausea and vomiting)    Sleep apnea in adult    not treated   Stroke    (CMD) 2013   with residual left-sided weakness   Past Surgical History:  Procedure Laterality Date   ANTERIOR CRUCIATE LIGAMENT REPAIR Left 1997   CATARACT EXTRACTION W/  INTRAOCULAR LENS IMPLANT Left 06/12/2024   CATARACT EXTRACTION W/ INTRAOCULAR LENS IMPLANT Left 06/12/2024   EXTRACTION CATARACT WITH INTRAOCULAR LENS INSERTION performed by Mike Sickles, MD at [facility] OR   CATARACT EXTRACTION W/ INTRAOCULAR LENS IMPLANT Right 07/17/2024   EXTRACTION CATARACT WITH INTRAOCULAR LENS INSERTION performed by Mike Sickles, MD at [facility] OR   CORONARY ANGIOPLASTY WITH STENT PLACEMENT     LARYNX SURGERY  2013    FAMILY HISTORY Family History  Problem Relation Name Age of Onset   Glaucoma Neg Hx     Macular degeneration Neg Hx      SOCIAL HISTORY Social History   Tobacco Use   Smoking status: Former    Types: Cigarettes   Smokeless tobacco: Never  Substance Use Topics   Alcohol use: Not Currently         OPHTHALMIC EXAM:   Base Eye Exam     Visual Acuity (Snellen - Linear)       Right Left   Dist [place] 20/25 -2 20/25 +1         Tonometry (Applanation, 10:58 AM)       Right Left   Pressure 14 14         Pupils       Shape React APD   Right Tiny Minimal None   Left Tiny Minimal None         Neuro/Psych     Oriented x3: Yes   Mood/Affect: Normal         Dilation     Left eye: 2.5% Phenylephrine, 1.0% Tropicamide @ 9:45 AM  Left eye only per protocol           Slit Lamp and Fundus Exam     Slit Lamp Exam       Right Left   Lids/Lashes Normal Normal   Conjunctiva/Sclera Melanosis Melanosis   Cornea Clear Clear   Anterior Chamber Deep and quiet Deep and quiet   Iris Round and reactive Round and  reactive   Lens PCIOL, PCO PCIOL, PCO   Anterior Vitreous Normal Normal            IMAGING AND PROCEDURES:  YAG Capsulotomy - OS - Left Eye       Time Out 10/31/2024. Confirmed correct patient, procedure, site, and patient consented.   Procedure   Procedure Date: 10/31/2024.   Notes Procedure: YAG capsulotomy OS  Yag capsulotomy OS performed today without complications.  Surgeon: Mike Sickles, MD  Assitant: NONE   Anesthesia: TOPICAL   No contacts lens used.   IOP at 30 min:   RTC - 1 month       ASSESSMENT/PLAN:  1. Status post cataract extraction of both eyes with insertion of intraocular lens      2. Retinal hemorrhage of left eye      3. Retinal hemorrhage of right eye      4. PCO (posterior capsular opacification), left  YAG Capsulotomy - OS - Left Eye      S/p Phaco OD 07/17/2024 - Doing well - PCO; Discussed YAG and held PARQ. Pt wishes to  proceed when timing is appropriate.  S/p Phaco OS 06/12/2024 - Doing well - PCO; Discussed YAG and held PARQ. Pt wishes to  proceed. - YAG OS performed today 10/31/2024  Retinal hemorrhages OU (OS>OD) - Followed by Dr. Alvia - Received 1 injection OS  DM2   RTC - 1-2 month YAG OD Return for 1-2 month YAG OD.   There are no Patient Instructions on file for this visit.   Ophthalmic Meds Ordered this visit:  There were no meds ordered this visit.    Explained the diagnoses, plan, and follow up with the patient and they expressed understanding.  Patient expressed understanding of the importance of proper follow up care.    Abbreviations: M myopia (nearsighted); A astigmatism; H hyperopia (farsighted); P presbyopia; Mrx spectacle prescription;  CTL contact lenses; OD right eye; OS left eye; OU both eyes  XT exotropia; ET esotropia; PEK punctate epithelial keratitis; PEE punctate epithelial erosions; DES dry eye syndrome; MGD meibomian gland dysfunction; ATs artificial tears; PFAT's  preservative free artificial tears; NSC nuclear sclerotic cataract; PSC posterior subcapsular cataract; ERM epi-retinal membrane; PVD posterior vitreous detachment; RD retinal detachment; DM diabetes mellitus; DR diabetic retinopathy; NPDR non-proliferative diabetic retinopathy; PDR proliferative diabetic retinopathy; CSME clinically significant macular edema; DME diabetic macular edema; dbh dot blot hemorrhages; CWS cotton wool spot; POAG primary open angle glaucoma; C/D cup-to-disc ratio; HVF humphrey visual field; GVF goldmann visual field; OCT optical coherence tomography; IOP intraocular pressure; BRVO Branch retinal vein occlusion; CRVO central retinal vein occlusion; CRAO central retinal artery occlusion; BRAO branch retinal artery occlusion; RT retinal tear; SB scleral buckle; PPV pars plana vitrectomy; VH Vitreous hemorrhage; PRP panretinal laser photocoagulation; IVK intravitreal kenalog ; VMT vitreomacular traction; MH Macular hole;  NVD neovascularization of the disc; NVE neovascularization elsewhere; AREDS age related eye disease study; ARMD age related macular degeneration; POAG primary open angle glaucoma; EBMD epithelial/anterior basement membrane dystrophy; ACIOL anterior chamber intraocular lens; IOL intraocular lens; PCIOL posterior chamber intraocular lens; Phaco/IOL phacoemulsification with intraocular lens placement; PRK photorefractive keratectomy; LASIK laser assisted in situ keratomileusis; HTN hypertension; DM diabetes mellitus; COPD chronic obstructive pulmonary disease   This document serves as a record of services personally performed by Mike Sickles, MD. It was created on their behalf by Jinnie Caldron, a trained medical scribe. The creation of this record is the providers dictation and/or activities during the visit.

## 2024-11-02 ENCOUNTER — Emergency Department (HOSPITAL_COMMUNITY)

## 2024-11-02 ENCOUNTER — Observation Stay (HOSPITAL_COMMUNITY)
Admission: EM | Admit: 2024-11-02 | Discharge: 2024-11-06 | Disposition: A | Attending: Internal Medicine | Admitting: Internal Medicine

## 2024-11-02 ENCOUNTER — Other Ambulatory Visit: Payer: Self-pay

## 2024-11-02 ENCOUNTER — Encounter (HOSPITAL_COMMUNITY): Payer: Self-pay

## 2024-11-02 DIAGNOSIS — I5042 Chronic combined systolic (congestive) and diastolic (congestive) heart failure: Secondary | ICD-10-CM | POA: Diagnosis not present

## 2024-11-02 DIAGNOSIS — D631 Anemia in chronic kidney disease: Secondary | ICD-10-CM | POA: Insufficient documentation

## 2024-11-02 DIAGNOSIS — Z79899 Other long term (current) drug therapy: Secondary | ICD-10-CM | POA: Insufficient documentation

## 2024-11-02 DIAGNOSIS — W19XXXA Unspecified fall, initial encounter: Secondary | ICD-10-CM | POA: Insufficient documentation

## 2024-11-02 DIAGNOSIS — Z7982 Long term (current) use of aspirin: Secondary | ICD-10-CM | POA: Insufficient documentation

## 2024-11-02 DIAGNOSIS — I2510 Atherosclerotic heart disease of native coronary artery without angina pectoris: Secondary | ICD-10-CM | POA: Insufficient documentation

## 2024-11-02 DIAGNOSIS — Z87891 Personal history of nicotine dependence: Secondary | ICD-10-CM | POA: Diagnosis not present

## 2024-11-02 DIAGNOSIS — R17 Unspecified jaundice: Secondary | ICD-10-CM | POA: Insufficient documentation

## 2024-11-02 DIAGNOSIS — M79652 Pain in left thigh: Secondary | ICD-10-CM | POA: Diagnosis not present

## 2024-11-02 DIAGNOSIS — Z794 Long term (current) use of insulin: Secondary | ICD-10-CM | POA: Insufficient documentation

## 2024-11-02 DIAGNOSIS — K4090 Unilateral inguinal hernia, without obstruction or gangrene, not specified as recurrent: Secondary | ICD-10-CM

## 2024-11-02 DIAGNOSIS — I130 Hypertensive heart and chronic kidney disease with heart failure and stage 1 through stage 4 chronic kidney disease, or unspecified chronic kidney disease: Secondary | ICD-10-CM | POA: Diagnosis not present

## 2024-11-02 DIAGNOSIS — Z8673 Personal history of transient ischemic attack (TIA), and cerebral infarction without residual deficits: Secondary | ICD-10-CM

## 2024-11-02 DIAGNOSIS — E1122 Type 2 diabetes mellitus with diabetic chronic kidney disease: Secondary | ICD-10-CM | POA: Diagnosis not present

## 2024-11-02 DIAGNOSIS — E785 Hyperlipidemia, unspecified: Secondary | ICD-10-CM | POA: Diagnosis not present

## 2024-11-02 DIAGNOSIS — N1832 Chronic kidney disease, stage 3b: Secondary | ICD-10-CM | POA: Diagnosis not present

## 2024-11-02 DIAGNOSIS — M25552 Pain in left hip: Secondary | ICD-10-CM

## 2024-11-02 DIAGNOSIS — M25562 Pain in left knee: Secondary | ICD-10-CM | POA: Diagnosis present

## 2024-11-02 DIAGNOSIS — J322 Chronic ethmoidal sinusitis: Secondary | ICD-10-CM | POA: Insufficient documentation

## 2024-11-02 DIAGNOSIS — J320 Chronic maxillary sinusitis: Secondary | ICD-10-CM | POA: Insufficient documentation

## 2024-11-02 DIAGNOSIS — K4030 Unilateral inguinal hernia, with obstruction, without gangrene, not specified as recurrent: Secondary | ICD-10-CM | POA: Insufficient documentation

## 2024-11-02 DIAGNOSIS — I639 Cerebral infarction, unspecified: Secondary | ICD-10-CM | POA: Diagnosis not present

## 2024-11-02 LAB — CBC WITH DIFFERENTIAL/PLATELET
Abs Immature Granulocytes: 0.01 K/uL (ref 0.00–0.07)
Basophils Absolute: 0 K/uL (ref 0.0–0.1)
Basophils Relative: 0 %
Eosinophils Absolute: 0.3 K/uL (ref 0.0–0.5)
Eosinophils Relative: 4 %
HCT: 31.5 % — ABNORMAL LOW (ref 39.0–52.0)
Hemoglobin: 10.3 g/dL — ABNORMAL LOW (ref 13.0–17.0)
Immature Granulocytes: 0 %
Lymphocytes Relative: 29 %
Lymphs Abs: 2.2 K/uL (ref 0.7–4.0)
MCH: 31.6 pg (ref 26.0–34.0)
MCHC: 32.7 g/dL (ref 30.0–36.0)
MCV: 96.6 fL (ref 80.0–100.0)
Monocytes Absolute: 0.9 K/uL (ref 0.1–1.0)
Monocytes Relative: 11 %
Neutro Abs: 4.1 K/uL (ref 1.7–7.7)
Neutrophils Relative %: 56 %
Platelets: 153 K/uL (ref 150–400)
RBC: 3.26 MIL/uL — ABNORMAL LOW (ref 4.22–5.81)
RDW: 12.7 % (ref 11.5–15.5)
WBC: 7.4 K/uL (ref 4.0–10.5)
nRBC: 0 % (ref 0.0–0.2)

## 2024-11-02 LAB — COMPREHENSIVE METABOLIC PANEL WITH GFR
ALT: 23 U/L (ref 0–44)
AST: 22 U/L (ref 15–41)
Albumin: 3.6 g/dL (ref 3.5–5.0)
Alkaline Phosphatase: 72 U/L (ref 38–126)
Anion gap: 4 — ABNORMAL LOW (ref 5–15)
BUN: 34 mg/dL — ABNORMAL HIGH (ref 8–23)
CO2: 27 mmol/L (ref 22–32)
Calcium: 8.5 mg/dL — ABNORMAL LOW (ref 8.9–10.3)
Chloride: 107 mmol/L (ref 98–111)
Creatinine, Ser: 2.02 mg/dL — ABNORMAL HIGH (ref 0.61–1.24)
GFR, Estimated: 34 mL/min — ABNORMAL LOW (ref >60)
Glucose, Bld: 153 mg/dL — ABNORMAL HIGH (ref 70–99)
Potassium: 4.4 mmol/L (ref 3.5–5.1)
Sodium: 138 mmol/L (ref 135–145)
Total Bilirubin: 0.8 mg/dL (ref 0.0–1.2)
Total Protein: 7.2 g/dL (ref 6.5–8.1)

## 2024-11-02 LAB — URINALYSIS, W/ REFLEX TO CULTURE (INFECTION SUSPECTED)
Bilirubin Urine: NEGATIVE
Glucose, UA: NEGATIVE mg/dL
Ketones, ur: NEGATIVE mg/dL
Leukocytes,Ua: NEGATIVE
Nitrite: NEGATIVE
Protein, ur: NEGATIVE mg/dL
Specific Gravity, Urine: 1.01 (ref 1.005–1.030)
pH: 6 (ref 5.0–8.0)

## 2024-11-02 LAB — I-STAT CG4 LACTIC ACID, ED
Lactic Acid, Venous: 0.4 mmol/L — ABNORMAL LOW (ref 0.5–1.9)
Lactic Acid, Venous: 1.3 mmol/L (ref 0.5–1.9)

## 2024-11-02 LAB — TSH: TSH: 2.449 u[IU]/mL (ref 0.350–4.500)

## 2024-11-02 LAB — GLUCOSE, CAPILLARY: Glucose-Capillary: 208 mg/dL — ABNORMAL HIGH (ref 70–99)

## 2024-11-02 LAB — CK: Total CK: 134 U/L (ref 49–397)

## 2024-11-02 LAB — CBG MONITORING, ED
Glucose-Capillary: 147 mg/dL — ABNORMAL HIGH (ref 70–99)
Glucose-Capillary: 225 mg/dL — ABNORMAL HIGH (ref 70–99)

## 2024-11-02 MED ORDER — FENTANYL CITRATE (PF) 50 MCG/ML IJ SOSY
50.0000 ug | PREFILLED_SYRINGE | Freq: Once | INTRAMUSCULAR | Status: AC
  Administered 2024-11-02: 50 ug via INTRAVENOUS
  Filled 2024-11-02: qty 1

## 2024-11-02 MED ORDER — ACETAMINOPHEN 650 MG RE SUPP: 650.0000 mg | Freq: Four times a day (QID) | RECTAL | Status: DC | PRN

## 2024-11-02 MED ORDER — ACETAMINOPHEN 325 MG PO TABS: 650.0000 mg | ORAL_TABLET | Freq: Four times a day (QID) | ORAL | Status: DC | PRN

## 2024-11-02 MED ORDER — ATORVASTATIN CALCIUM 40 MG PO TABS
40.0000 mg | ORAL_TABLET | Freq: Every day | ORAL | Status: DC
  Administered 2024-11-02 – 2024-11-05 (×4): 40 mg via ORAL
  Filled 2024-11-02 (×4): qty 1

## 2024-11-02 MED ORDER — CYCLOBENZAPRINE HCL 10 MG PO TABS
5.0000 mg | ORAL_TABLET | Freq: Once | ORAL | Status: AC
  Administered 2024-11-02: 5 mg via ORAL
  Filled 2024-11-02: qty 1

## 2024-11-02 MED ORDER — HYDROCODONE-ACETAMINOPHEN 5-325 MG PO TABS
1.0000 | ORAL_TABLET | ORAL | Status: DC | PRN
  Administered 2024-11-03 – 2024-11-04 (×2): 1 via ORAL
  Filled 2024-11-02 (×2): qty 1

## 2024-11-02 MED ORDER — SODIUM CHLORIDE 0.9 % IV SOLN: Freq: Once | INTRAVENOUS | Status: AC

## 2024-11-02 MED ORDER — SODIUM CHLORIDE 0.9% FLUSH
3.0000 mL | Freq: Two times a day (BID) | INTRAVENOUS | Status: DC
  Administered 2024-11-02 – 2024-11-06 (×8): 3 mL via INTRAVENOUS

## 2024-11-02 MED ORDER — INSULIN ASPART 100 UNIT/ML IJ SOLN
0.0000 [IU] | INTRAMUSCULAR | Status: DC
  Administered 2024-11-02 (×2): 3 [IU] via SUBCUTANEOUS
  Administered 2024-11-03 (×2): 1 [IU] via SUBCUTANEOUS
  Administered 2024-11-03 (×2): 2 [IU] via SUBCUTANEOUS
  Administered 2024-11-04: 1 [IU] via SUBCUTANEOUS
  Administered 2024-11-04: 2 [IU] via SUBCUTANEOUS
  Administered 2024-11-04 (×2): 3 [IU] via SUBCUTANEOUS
  Administered 2024-11-04: 2 [IU] via SUBCUTANEOUS
  Administered 2024-11-05: 17:00:00 1 [IU] via SUBCUTANEOUS
  Administered 2024-11-05: 3 [IU] via SUBCUTANEOUS
  Administered 2024-11-05: 11:00:00 2 [IU] via SUBCUTANEOUS
  Administered 2024-11-05: 21:00:00 5 [IU] via SUBCUTANEOUS
  Administered 2024-11-05: 05:00:00 2 [IU] via SUBCUTANEOUS
  Administered 2024-11-06: 09:00:00 5 [IU] via SUBCUTANEOUS
  Administered 2024-11-06: 17:00:00 2 [IU] via SUBCUTANEOUS
  Administered 2024-11-06: 04:00:00 3 [IU] via SUBCUTANEOUS
  Administered 2024-11-06: 12:00:00 9 [IU] via SUBCUTANEOUS
  Administered 2024-11-06: 3 [IU] via SUBCUTANEOUS
  Filled 2024-11-02: qty 5
  Filled 2024-11-02 (×2): qty 1
  Filled 2024-11-02: qty 2
  Filled 2024-11-02 (×2): qty 3
  Filled 2024-11-02: qty 5
  Filled 2024-11-02: qty 2
  Filled 2024-11-02: qty 3
  Filled 2024-11-02: qty 2
  Filled 2024-11-02 (×2): qty 3
  Filled 2024-11-02: qty 1
  Filled 2024-11-02: qty 6
  Filled 2024-11-02: qty 5
  Filled 2024-11-02 (×3): qty 2
  Filled 2024-11-02 (×2): qty 3

## 2024-11-02 MED ORDER — CARVEDILOL 25 MG PO TABS
25.0000 mg | ORAL_TABLET | Freq: Two times a day (BID) | ORAL | Status: DC
  Administered 2024-11-02 – 2024-11-06 (×8): 25 mg via ORAL
  Filled 2024-11-02 (×8): qty 1

## 2024-11-02 MED ORDER — FLUTICASONE PROPIONATE 50 MCG/ACT NA SUSP: 2.0000 | Freq: Every day | NASAL | Status: DC | PRN

## 2024-11-02 MED ORDER — FLUTICASONE FUROATE-VILANTEROL 100-25 MCG/ACT IN AEPB: 1.0000 | INHALATION_SPRAY | Freq: Every day | RESPIRATORY_TRACT | Status: DC

## 2024-11-02 MED ORDER — HEPARIN SODIUM (PORCINE) 5000 UNIT/ML IJ SOLN
5000.0000 [IU] | Freq: Two times a day (BID) | INTRAMUSCULAR | Status: AC
  Filled 2024-11-02 (×2): qty 1

## 2024-11-02 MED ORDER — BOOST / RESOURCE BREEZE PO LIQD CUSTOM
1.0000 | Freq: Three times a day (TID) | ORAL | Status: DC
  Administered 2024-11-03 – 2024-11-06 (×7): 1 via ORAL

## 2024-11-02 MED ORDER — FENTANYL CITRATE (PF) 50 MCG/ML IJ SOSY: 25.0000 ug | PREFILLED_SYRINGE | INTRAMUSCULAR | Status: DC | PRN

## 2024-11-02 MED ORDER — INSULIN ASPART 100 UNIT/ML IJ SOLN: 0.0000 [IU] | Freq: Three times a day (TID) | INTRAMUSCULAR | Status: DC

## 2024-11-02 MED ORDER — ACETAMINOPHEN 325 MG PO TABS
650.0000 mg | ORAL_TABLET | Freq: Once | ORAL | Status: AC
  Administered 2024-11-02: 650 mg via ORAL
  Filled 2024-11-02: qty 2

## 2024-11-02 MED ORDER — IOHEXOL 350 MG/ML SOLN
60.0000 mL | Freq: Once | INTRAVENOUS | Status: AC | PRN
  Administered 2024-11-02: 60 mL via INTRAVENOUS

## 2024-11-02 MED ORDER — HYDROCODONE-ACETAMINOPHEN 5-325 MG PO TABS: 1.0000 | ORAL_TABLET | ORAL | Status: DC | PRN

## 2024-11-02 MED ORDER — MORPHINE SULFATE (PF) 2 MG/ML IV SOLN: 2.0000 mg | INTRAVENOUS | Status: DC | PRN

## 2024-11-02 NOTE — ED Notes (Signed)
 Pt back from ultrasound, pt states that his pain is an 8/10, states that he doesn't need anything for his pain at this time.

## 2024-11-02 NOTE — ED Triage Notes (Signed)
 Pt was walking in his house, he felt a sharp pain left leg which caused his knee to give and and caused him to fall into a padded sofa. He has no head injury, No LOC, Pt does mention still having some pain in the left lower leg. He is otherwise stable, with no deficits.   Medic vitals   160/100 65hr 16rr 99%ra 97.5

## 2024-11-02 NOTE — Hospital Course (Signed)
 [name]

## 2024-11-02 NOTE — ED Notes (Signed)
 Pt back from MRI, pt continues to report 8/10 L side/ leg pain, pt states that he doesn't want anything for pain at this time, family at bedside, pt awake and answering questions appropriately.,

## 2024-11-02 NOTE — ED Notes (Signed)
 Pt in MRI.

## 2024-11-02 NOTE — ED Provider Notes (Signed)
 Patient signed out to me at 1500 by Dr. Ginger pending surgery evaluation.  In short this is a 73 year old male that presented to the emergency department with left leg pain as well as left groin pain.  The patient had workup that did show a left inguinal hernia that was nonreproducible, no signs of strangulation.  The patient had imaging that showed no signs of stroke or any bony abnormality in his left lower extremity.  The patient was seen by Coye Favre PA who recommended admission with plan for OR next week after he has been off Plavix  for a few days.  Patient is having difficulty bearing weight on his left lower extremity so will need PT eval inpatient as well.   Kingsley, Eusebia Grulke K, DO 11/02/24 1704

## 2024-11-02 NOTE — ED Provider Notes (Signed)
 [place] EMERGENCY DEPARTMENT AT [facility] Provider Note   CSN: [id number] Arrival date & time: 11/02/24  0630     Patient presents with: [name]   [name] is a 73 y.o. male.   The history is provided by the patient and medical records. No language interpreter was used.  Fall This is a new problem. The current episode started 6 to 12 hours ago. The problem has not changed since onset.Pertinent negatives include no chest pain, no abdominal pain, no headaches and no shortness of breath. Nothing aggravates the symptoms. Nothing relieves the symptoms. He has tried nothing for the symptoms. The treatment provided no relief.       Prior to Admission medications  Medication Sig Start Date End Date Taking? Authorizing Provider  aspirin  EC 81 MG tablet Take 1 tablet (81 mg total) by mouth daily. Swallow whole. 07/10/20   Dunn, Dayna N, PA-C  atorvastatin  (LIPITOR) 40 MG tablet TAKE 1 TABLET EVERY DAY 02/20/24   Walker, [name], NP  Blood Pressure Monitoring (BLOOD PRESSURE CUFF) MISC 1 Units by Does not apply route daily. 02/03/21   Raford Riggs, MD  budesonide -formoterol  (SYMBICORT ) 80-4.5 MCG/ACT inhaler 2 puffs BID and prn shortness of breath or wheezing 03/09/24   Kabbe, Angela M, NP  calcium  carbonate (TUMS - DOSED IN MG ELEMENTAL CALCIUM ) 500 MG chewable tablet Chew 1 tablet by mouth daily as needed for indigestion or heartburn.    [provider]  carvedilol  (COREG ) 25 MG tablet TAKE 1 TABLET TWICE DAILY 08/30/24   Raford Riggs, MD  chlorthalidone  (HYGROTON ) 25 MG tablet Take 1 tablet (25 mg total) by mouth daily. 09/18/24   Raford Riggs, MD  clopidogrel  (PLAVIX ) 75 MG tablet TAKE 1 TABLET EVERY DAY 07/17/24   Raford Riggs, MD  dextrose  (GLUCO TO GO 15) 40 % GEL Take 1 Tube by mouth once as needed for low blood sugar.    [provider]  fluticasone  (FLONASE ) 50 MCG/ACT nasal spray Place 2 sprays into both nostrils daily. 03/09/24    Richad Jon HERO, NP  furosemide  (LASIX ) 40 MG tablet TAKE 1 TABLET (40 MG TOTAL) BY MOUTH DAILY. NEED APPT. 09/16/21   Raford Riggs, MD  glucose blood (ACCU-CHEK AVIVA PLUS) test strip uad tid 12/14/21   Geofm Glade PARAS, MD  insulin  glargine (LANTUS  SOLOSTAR) 100 UNIT/ML Solostar Pen Inject 7-8 Units into the skin daily. 07/31/24   Trixie File, MD  insulin  lispro (HUMALOG  KWIKPEN) 100 UNIT/ML KwikPen Inject 6-10 Units into the skin 2 (two) times daily before a meal. 12/09/23   Trixie File, MD  Insulin  Pen Needle 32G X 4 MM MISC Use 4x a day 07/10/24   Trixie File, MD  losartan  (COZAAR ) 100 MG tablet TAKE 1 TABLET EVERY DAY 01/23/24   Raford Riggs, MD  Menthol , Topical Analgesic, 4 % GEL Apply topically.    [provider]  Microlet Lancets MISC UAD to check sugars TID.  E11.22 12/14/21   Geofm Glade PARAS, MD  nitroGLYCERIN  (NITROSTAT ) 0.4 MG SL tablet Place 1 tablet (0.4 mg total) under the tongue every 5 (five) minutes as needed for chest pain (up to 3 doses). 12/23/22 10/01/24  Raford Riggs, MD  senna-docusate (SENOKOT-S) 8.6-50 MG per tablet Take 2 tablets by mouth 2 (two) times daily. For constipation. 09/07/12   Maurice Sharlet RAMAN, PA-C  Spacer/Aero-Holding Chambers DEVI 1 each by Does not apply route as needed. 03/09/24   Richad Jon HERO, NP    Allergies: Penicillins, Pneumococcal  vaccines, Shellfish allergy, Barbiturates, Gabapentin , Influenza vaccines, Lisinopril , Other, Sildenafil, Topiramate, Zofran  [ondansetron ], Amlodipine , Metformin , Tadalafil, and Vardenafil    Review of Systems  Constitutional:  Negative for chills, fatigue and fever.  HENT:  Negative for congestion.   Respiratory:  Negative for cough, chest tightness and shortness of breath.   Cardiovascular:  Negative for chest pain, palpitations and leg swelling.  Gastrointestinal:  Positive for constipation. Negative for abdominal pain, diarrhea, nausea and vomiting.  Genitourinary:  Negative for  dysuria and flank pain.  Musculoskeletal:  Negative for back pain, neck pain and neck stiffness.  Skin:  Negative for rash and wound.  Neurological:  Positive for weakness (at baseline) and numbness (at baseline). Negative for dizziness, light-headedness and headaches.  Psychiatric/Behavioral:  Negative for agitation and confusion.   All other systems reviewed and are negative.   Updated Vital Signs BP 128/61 (BP Location: Left Arm)   Pulse 62   Temp 98.1 F (36.7 C) (Oral)   Resp 16   SpO2 100%   Physical Exam Vitals and nursing note reviewed.  Constitutional:      General: He is not in acute distress.    Appearance: He is well-developed. He is not ill-appearing, toxic-appearing or diaphoretic.  HENT:     Head: Normocephalic and atraumatic.     Nose: No congestion or rhinorrhea.     Mouth/Throat:     Mouth: Mucous membranes are moist.     Pharynx: No oropharyngeal exudate or posterior oropharyngeal erythema.  Eyes:     Extraocular Movements: Extraocular movements intact.     Conjunctiva/sclera: Conjunctivae normal.     Pupils: Pupils are equal, round, and reactive to light.  Cardiovascular:     Rate and Rhythm: Normal rate and regular rhythm.     Pulses: Normal pulses.     Heart sounds: No murmur heard. Pulmonary:     Effort: Pulmonary effort is normal. No respiratory distress.     Breath sounds: Normal breath sounds. No wheezing, rhonchi or rales.  Chest:     Chest wall: No tenderness.  Abdominal:     General: Abdomen is flat.     Palpations: Abdomen is soft.     Tenderness: There is abdominal tenderness (l groin).      Comments: On reassessment, patient now has tenderness in his left inguinal area and groin with a bulge.  Also tenderness goes to his scrotum.  Genitourinary:   Musculoskeletal:        General: Tenderness present. No swelling.     Cervical back: Neck supple. No tenderness.     Left hip: Tenderness present.     Left upper leg: Tenderness present.      Left knee: Tenderness present.       Legs:     Comments: Tenderness and pain in the left knee, left hip, and left thigh.  Distally he had intact pulse.  He has numbness and weakness in that left leg which she reports is at its baseline.  Skin:    General: Skin is warm and dry.     Capillary Refill: Capillary refill takes less than 2 seconds.     Findings: No erythema or rash.  Neurological:     Mental Status: He is alert. Mental status is at baseline.     Sensory: Sensory deficit present.     Motor: Weakness present.  Psychiatric:        Mood and Affect: Mood normal.     (all labs ordered are listed, but  only abnormal results are displayed) Labs Reviewed  CBC WITH DIFFERENTIAL/PLATELET - Abnormal; Notable for the following components:      Result Value   RBC 3.26 (*)    Hemoglobin 10.3 (*)    HCT 31.5 (*)    All other components within normal limits  COMPREHENSIVE METABOLIC PANEL WITH GFR - Abnormal; Notable for the following components:   Glucose, Bld 153 (*)    BUN 34 (*)    Creatinine, Ser 2.02 (*)    Calcium  8.5 (*)    GFR, Estimated 34 (*)    Anion gap 4 (*)    All other components within normal limits  CBG MONITORING, ED - Abnormal; Notable for the following components:   Glucose-Capillary 147 (*)    All other components within normal limits  I-STAT CG4 LACTIC ACID, ED - Abnormal; Notable for the following components:   Lactic Acid, Venous 0.4 (*)    All other components within normal limits  CK  TSH  URINALYSIS, W/ REFLEX TO CULTURE (INFECTION SUSPECTED)    EKG: None  Radiology: MR BRAIN WO CONTRAST Result Date: 11/02/2024 EXAM: MRI BRAIN WITHOUT CONTRAST 11/02/2024 01:52:46 PM TECHNIQUE: Multiplanar multisequence MRI of the head/brain was performed without the administration of intravenous contrast. COMPARISON: Head CT 11/02/2024 and MRI 10/11/2012. CLINICAL HISTORY: Neuro deficit, acute, stroke suspected; Patient has left-sided deficits and CT shows  possible age-indeterminate stroke. He did fall when his left leg gave out on him. Rule out new stroke. FINDINGS: BRAIN AND VENTRICLES: No acute infarct, mass, midline shift, hydrocephalus, or extra-axial fluid collection is identified. There is a chronic infarct involving the right thalamus and posterior limb of the right internal capsule with associated chronic blood products, and this infarct was acute in 2013. A chronic microhemorrhage in the right occipital lobe is unchanged. Patchy T2 hyperintensities in the cerebral white matter and pons have mildly progressed from 2013 and are nonspecific but compatible with moderate chronic small vessel ischemic disease. There are multiple chronic lacunar infarcts in the basal ganglia and cerebellum bilaterally which are largely new from 2013. There is mild generalized cerebral atrophy. Major intracranial vascular flow voids are preserved. ORBITS: Bilateral cataract extraction. SINUSES AND MASTOIDS: Chronic right maxillary sinusitis with sinus expansion and bowing into the nasal cavity as described on CT. Extensive right anterior and mid ethmoid air cell opacification as well. No significant mastoid fluid. BONES AND SOFT TISSUES: Normal marrow signal. No acute soft tissue abnormality. IMPRESSION: 1. No acute intracranial abnormality. 2. Chronic right thalamic infarct. 3. Moderate chronic small vessel ischemic disease. 4. Small chronic cerebellar infarcts. Electronically signed by: Dasie Hamburg MD 11/02/2024 02:11 PM EST RP Workstation: [id number]   US  SCROTUM W/DOPPLER Result Date: 11/02/2024 CLINICAL DATA:  Left groin pain. EXAM: SCROTAL ULTRASOUND DOPPLER ULTRASOUND OF THE TESTICLES TECHNIQUE: Complete ultrasound examination of the testicles, epididymis, and other scrotal structures was performed. Color and spectral Doppler ultrasound were also utilized to evaluate blood flow to the testicles. COMPARISON:  None Available. FINDINGS: Right testicle Measurements: 3.0 x  1.7 x 2.4 cm. No mass or microlithiasis visualized. Doppler: There is normal vascularity on color doppler examination. Spectral doppler arterial and venous waveforms are normal. Left testicle Measurements:  3.0 x 1.9 x 2.6 cm. No mass or microlithiasis visualized. Doppler: There is normal vascularity on color doppler examination. Spectral doppler arterial and venous waveforms are normal. Right epididymis:  Normal in size and appearance. Left epididymis: Normal in size and appearance. 3 mm epididymal head cyst. Hydrocele:  None  visualized. Varicocele:  None visualized. IMPRESSION: No acute abnormality identified. Electronically Signed   By: Harrietta Sherry M.D.   On: 11/02/2024 13:02   CT ABDOMEN PELVIS W CONTRAST Result Date: 11/02/2024 EXAM: CT ABDOMEN AND PELVIS WITH CONTRAST 11/02/2024 11:09:09 AM TECHNIQUE: CT of the abdomen and pelvis was performed with the administration of 60 mL of iohexol  (OMNIPAQUE ) 350 MG/ML injection. Multiplanar reformatted images are provided for review. Automated exposure control, iterative reconstruction, and/or weight-based adjustment of the mA/kV was utilized to reduce the radiation dose to as low as reasonably achievable. COMPARISON: Comparison is made to 07/14/2023. CLINICAL HISTORY: Left inguinal tenderness and possible hernia. FINDINGS: LOWER CHEST: No acute abnormality. LIVER: The liver is unremarkable. GALLBLADDER AND BILE DUCTS: Cholelithiasis without superimposed pericholecystic inflammatory change. No intra or extrahepatic biliary ductal dilation. SPLEEN: No acute abnormality. PANCREAS: Lobulated cystic lesion is again seen within the uncinate process of the pancreas measuring 2.0 x 2.4 cm, mildly enlarged since prior examination. Several small septa are noted and the lesion may represent a serous cystadenoma, but is not optimally characterized on this examination. If indicated, this will be better assessed with dedicated contrast enhanced MRI examination. ADRENAL  GLANDS: No acute abnormality. KIDNEYS, URETERS AND BLADDER: No stones in the kidneys or ureters. No hydronephrosis. No perinephric or periureteral stranding. Urinary bladder is unremarkable. GI AND BOWEL: Small left inguinal hernia contains a single loop of unremarkable mid small bowel. Small fat-containing right inguinal hernia. The stomach, small bowel, and large bowel are otherwise unremarkable. Appendix normal. There is no bowel obstruction. PERITONEUM AND RETROPERITONEUM: No ascites. No free air. VASCULATURE: Aorta is normal in caliber. Mild aortoiliac atherosclerotic calcification. No aortic aneurysm. LYMPH NODES: No lymphadenopathy. REPRODUCTIVE ORGANS: No acute abnormality. BONES AND SOFT TISSUES: No acute osseous abnormality. No focal soft tissue abnormality. IMPRESSION: 1. Small left inguinal hernia containing a single loop of unremarkable mid small bowel. No superimposed inflammatory changes. 2. Small fat-containing right inguinal hernia. 3. Lobulated cystic lesion within the uncinate process of the pancreas, mildly enlarged since prior examination, indeterminate; recommend pancreas-protocol MRI for further characterization, if clinically indicated . Electronically signed by: Dorethia Molt MD 11/02/2024 11:55 AM EST RP Workstation: [id number]   DG Chest 2 View Result Date: 11/02/2024 CLINICAL DATA:  Fall EXAM: CHEST - 2 VIEW COMPARISON:  March 09, 2024 FINDINGS: The heart size and mediastinal contours are within normal limits. Both lungs are clear. The visualized skeletal structures are unremarkable. IMPRESSION: No active cardiopulmonary disease. Electronically Signed   By: Lynwood Landy Raddle M.D.   On: 11/02/2024 08:56   DG Knee Complete 4 Views Left Result Date: 11/02/2024 CLINICAL DATA:  Fall EXAM: LEFT KNEE - COMPLETE 4+ VIEW COMPARISON:  None Available. FINDINGS: No evidence of fracture, dislocation, or joint effusion. Status post ACL reconstruction. Mild patellar spurring. Moderate narrowing of  medial joint space. Soft tissues are unremarkable. IMPRESSION: No acute abnormality seen. Electronically Signed   By: Lynwood Landy Raddle M.D.   On: 11/02/2024 08:55   DG Femur Min 2 Views Left Result Date: 11/02/2024 CLINICAL DATA:  Fall EXAM: LEFT FEMUR 2 VIEWS COMPARISON:  None Available. FINDINGS: There is no evidence of fracture or other focal bone lesions. Soft tissues are unremarkable. Status post ACL reconstruction. IMPRESSION: No acute abnormality seen. Electronically Signed   By: Lynwood Landy Raddle M.D.   On: 11/02/2024 08:54   DG Pelvis 1-2 Views Result Date: 11/02/2024 CLINICAL DATA:  Fall EXAM: PELVIS - 1-2 VIEW COMPARISON:  None Available. FINDINGS: There is  no evidence of pelvic fracture or diastasis. No pelvic bone lesions are seen. IMPRESSION: Negative. Electronically Signed   By: Lynwood Landy Raddle M.D.   On: 11/02/2024 08:52   CT Cervical Spine Wo Contrast Result Date: 11/02/2024 EXAM: CT CERVICAL SPINE WITHOUT CONTRAST 11/02/2024 08:03:00 AM TECHNIQUE: CT of the cervical spine was performed without the administration of intravenous contrast. Multiplanar reformatted images are provided for review. Automated exposure control, iterative reconstruction, and/or weight based adjustment of the mA/kV was utilized to reduce the radiation dose to as low as reasonably achievable. COMPARISON: None available. CLINICAL HISTORY: Polytrauma, blunt Polytrauma, blunt FINDINGS: CERVICAL SPINE: BONES AND ALIGNMENT: No acute fracture or traumatic malalignment. DEGENERATIVE CHANGES: No significant degenerative changes. SOFT TISSUES: No prevertebral soft tissue swelling. IMPRESSION: 1. No acute abnormality of the cervical spine. Electronically signed by: Evalene Coho MD 11/02/2024 08:09 AM EST RP Workstation: [id number]   CT Head Wo Contrast Result Date: 11/02/2024 EXAM: CT HEAD WITHOUT 11/02/2024 08:03:00 AM TECHNIQUE: CT of the head was performed without the administration of intravenous contrast. Automated  exposure control, iterative reconstruction, and/or weight based adjustment of the mA/kV was utilized to reduce the radiation dose to as low as reasonably achievable. COMPARISON: CT of the head dated 10/10/2012. CLINICAL HISTORY: Head trauma, minor (Age >= 65y). FINDINGS: BRAIN AND VENTRICLES: No acute intracranial hemorrhage. No mass effect or midline shift. No extra-axial fluid collection. No evidence of acute infarct. No hydrocephalus. Age indeterminate infarct in the right thalamus. There is age-related atrophy and mild-to-moderate cerebral white matter disease. There is no evidence of acute intracranial injury. ORBITS: No acute abnormality. Bilateral lens replacement noted. SINUSES AND MASTOIDS: Right maxillary sinus: Complete opacification with hyperdensity, radiodense debris, and calcification, suggesting chronic sinusitis. Dehiscence and bowing of the medial wall. Left maxillary sinus: Polypoid mucosal thickening within the floor. Right ethmoid sinus/air cells: Mucosal thickening and moderate opacification. SOFT TISSUES AND SKULL: No acute skull fracture. No acute soft tissue abnormality. IMPRESSION: 1. No acute intracranial abnormality. 2. Age indeterminate infarct in the right thalamus. 3. 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 Polypoid mucosal thickening in the left maxillary sinus. Electronically signed by: Evalene Coho MD 11/02/2024 08:08 AM EST RP Workstation: [id number]     Procedures   Medications Ordered in the ED  fentaNYL  (SUBLIMAZE ) injection 50 mcg (50 mcg Intravenous Given 11/02/24 0856)  iohexol  (OMNIPAQUE ) 350 MG/ML injection 60 mL (60 mLs Intravenous Contrast Given 11/02/24 1104)                                    Medical Decision Making Amount and/or Complexity of Data Reviewed Labs: ordered. Radiology: ordered.  Risk Prescription drug management.    Rayshun Kandler  is a 73 y.o. male with a past medical history significant for hypertension, hyperlipidemia, diabetes, CAD status post PCI, previous TIA, previous stroke, peripheral neuropathy, CHF, CKD, and sleep apnea who presents with fall and left leg pain.  According to patient, he was walking from his bathroom back to his bedroom last night when he felt a sharp pain suddenly in his left leg causing him to fall to the ground.  He has not been able to walk since.  He has pain from his left hip and groin area down into his left knee but denies any more distal discomfort.  He did hit his head when he fell  but did not lose consciousness.  He is not reporting significant headache.  He says that he chronically has neuropathy pain on his left side from his previous stroke and he always has some numbness and weakness on that left side.  He said that the last couple days he is having more muscular pain in his left leg before this fall.  On exam, lungs clear.  Chest is nontender.  Abdomen nontender.  He does have tenderness in his left hip significant tenderness in his left thigh, and some pain in his left knee.  Does hurt when I move his hip or his knee.  Distally he did have intact pulse and is not significantly edematous.  He has some weakness in his left arm and left leg compared to the right and some numbness in the left arm and left leg compared to the right.  Symmetric smile for me.  Pupils symmetric and reactive with normal extract movements.  Clear speech.  Vital signs reassuring on arrival.  Will give some pain medicine for his significant discomfort which she reports is greater than 10 out of 10.  Will get x-rays to look for femur knee or hip fracture or pelvis fracture.  Will get chest x-ray as well given the fall.  Technically patient is on Plavix  and he hit his head however there is no other evidence of significant head injury.  Will hold on activation as a level trauma initially but we will get a CT of the head and  neck.  As he has no significant tenderness or pain to his chest or abdomen we will hold on CT imaging of his torso initially.  Will get some screening labs including a CK as he has had the musculoskeletal soreness and pain for the last few days.  Anticipate reassessment after workup to determine disposition.  10:22 AM X-rays of the left hip, thigh, and knee did not show acute fracture.  We discussed it could be a nonbony injury or pain however now he is asking me about his groin and tell me he thinks he has a hernia.  He initially was not complaining of this.  Patient does need to have tenderness in his left inguinal area with some bulging.  Difficult to tell if this is just adipose tissue versus hernia.  He also tenderness going to his left testicle.  A chaperone was present during this evaluation.  Will get ultrasound of the testicle and a CT abdomen pelvis with contrast to evaluate.  Also, his CT head did not show fracture but did show a age-indeterminate right thalamic stroke.  Given his left-sided symptoms and how he says that he has had nerve pains with his previous strokes before, we will get MRI to clarify if this is new stroke or not.  Anticipate reassessment after further imaging.   3:10 PM MRI does not show new stroke.  He technically did not have new numbness or weakness compared to his baseline but he just has more of the pain in his left upper leg.  Ultrasound of the scrotum and groin did not show acute abnormality.  No torsion.  CT abdomen pelvis shows multiple findings including a pancreatic cyst he needs outpatient follow-up for which we discussed.  It also showed bilateral inguinal hernias but the left has some bowel in it.  There is no clear evidence of incarceration but he is still very tender there.  He reports that is going on for the last week or 2 and has  been worsening.  As I was unable to reduce it any sudden severe pain, I discussion they would like us  to consult general  surgery.  Will call general surgery to come see.  Patient also says he still in pain in his left thigh although the imaging was negative.  We offered knee immobilizer as his pain is just above his knee and bending his knee makes it worse but he does not want that.  Anticipate he will need physical therapy to evaluate as he does not think he can safely walk at this time and fell before coming here.  Will consult physical therapy as well but anticipate he may become a boarder while waiting for PT to evaluate to see if he is safe for discharge back home or needs placement for rehab facility.       Final diagnoses:  Fall, initial encounter  Left thigh pain  Left inguinal hernia     Clinical Impression: 1. Fall, initial encounter   2. Left thigh pain   3. Left inguinal hernia     Disposition: Care transferred on continue to wait for general surgery valuation recommendations and also PT evaluation and recommendations.  Anticipate he may be a boarder overnight to see PT.  This note was prepared with assistance of Conservation officer, historic buildings. Occasional wrong-word or sound-a-like substitutions may have occurred due to the inherent limitations of voice recognition software.       Spenser Cong, Lonni PARAS, MD 11/02/24 [id number]

## 2024-11-02 NOTE — ED Notes (Signed)
 Pt in bed, pt states that his pain is an 8/10, states that he doesn't want anything more for pain at this time.

## 2024-11-02 NOTE — ED Notes (Signed)
 Pt in ultrasound

## 2024-11-02 NOTE — Consult Note (Signed)
 [name] 1950/11/29  [id number].    Requesting MD: Tegeler, MD Chief Complaint/Reason for Consult: symptomatic left inguinal hernia  HPI:  [name] is a 73 y/o M with a PMH including, but not limited to, CAD, CHF, GERD, HLD, OSA, and CVA who presented to the ED after a fall at home. Reportedly had left leg pain that caused him to fall, landing on his sofa. No head injury or LOC. Cc Is LLE pain. CT scan of abdomen pelvis performed in the ED as part of his trauma workup showed a left inguinal hernia containing a loop of small bowel and general surgery is asked to consult. Patient reports a known history of inguinal hernia for about 5 years. He has had left groin pain for two weeks prior to his fall. States he has been having flatus and his last BM was this morning. Tolerating PO intake without nausea and vomiting.   Blood thinning meds: plavix , last dose yesterday 12/11 10:00AM Abdominal surgeries: denies history of abdominal surgery Substance history: denies alcohol or drug use, former cigarette smoker    ROS: Review of Systems  All other systems reviewed and are negative.   Family History  Problem Relation Age of Onset   Cancer Mother    Liver cancer Brother    Colon cancer Neg Hx    Esophageal cancer Neg Hx     Past Medical History:  Diagnosis Date   ALLERGIC RHINITIS    CAD, NATIVE VESSEL    BMS to OM1 2001, DES to BMS 2005   Chronic back pain    herniated disc   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   Chronic kidney disease    Constipation    takes Carafate  four times day   DIAB W/UNSPEC COMP TYPE II/UNSPEC TYPE UNCNTRL    ERECTILE DYSFUNCTION    GERD    HYPERLIPIDEMIA-MIXED    HYPERTENSION, BENIGN    LBBB (left bundle branch block) 02/02/2016   MORTON'S NEUROMA, RIGHT    OSA on CPAP 09/09/2020   Peripheral neuropathy    Seasonal allergies    takes Allegra and Benadryl  daily prn;uses Flonase  daily    SHOULDER PAIN, RIGHT    Snoring 09/09/2020   Stroke, 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, s/p CIR    TIA on medication 02/2012   Unstable angina (HCC) 07/09/2020    Past Surgical History:  Procedure Laterality Date   ANGIOPLASTY     COLONOSCOPY     CORONARY ANGIOPLASTY  2005   2 stents   CORONARY STENT INTERVENTION N/A 07/10/2020   Procedure: CORONARY STENT INTERVENTION;  Surgeon: Jordan, Peter M, MD;  Location: [facility] INVASIVE CV LAB;  Service: Cardiovascular;  Laterality: N/A;   DENTAL SURGERY     LARYNGOPLASTY  08/07/2012   Procedure: LARYNGOPLASTY;  Surgeon: Ida Loader, MD;  Location: [facility] OR;  Service: ENT;  Laterality: Left;  Left Vocal Cord Medialyzation   LEFT HEART CATH AND CORONARY ANGIOGRAPHY N/A 07/10/2020   Procedure: LEFT HEART CATH AND CORONARY ANGIOGRAPHY;  Surgeon: Jordan, Peter M, MD;  Location: [facility] INVASIVE CV LAB;  Service: Cardiovascular;  Laterality: N/A;   left knee surgury     x 2    stent  2001, 2004   coronary stents    Social History:  reports that he quit smoking about 41 years ago. His smoking use included cigarettes. He started smoking about 50 years ago. He has a 22.5 pack-year smoking history.  He has never used smokeless tobacco. He reports that he does not drink alcohol and does not use drugs.  Allergies: Allergies[1]  (Not in a hospital admission)    Physical Exam: Blood pressure 114/75, pulse 64, temperature 98.2 F (36.8 C), temperature source Oral, resp. rate 17, SpO2 100%. General: Pleasant male laying on hospital bed, appears stated age, NAD. HEENT: head -normocephalic, atraumatic; Eyes: PERRLA, no conjunctival injection Neck- Trachea is midline CV- Normal HR during encounter. Pulm- Breathing is non-labored ORA Abd- soft, NT/ND, appropriate bowel sounds in 4 quadrants. There is a visible and palpable bulge in the left groin consistent with the hernia or CT. No cellulitis or overlying skin changes. Area is tender. I am unable to reduce  the hernia.  GU- normal male anatomy MSK- UE/LE symmetrical, no cyanosis, clubbing, or edema.  Psych- Alert and Oriented x3 with appropriate affect   Results for orders placed or performed during the hospital encounter of 11/02/24 (from the past 48 hours)  CBG monitoring, ED     Status: Abnormal   Collection Time: 11/02/24  6:41 AM  Result Value Ref Range   Glucose-Capillary 147 (H) 70 - 99 mg/dL    Comment: Glucose reference range applies only to samples taken after fasting for at least 8 hours.  CBC with Differential     Status: Abnormal   Collection Time: 11/02/24  7:36 AM  Result Value Ref Range   WBC 7.4 4.0 - 10.5 K/uL   RBC 3.26 (L) 4.22 - 5.81 MIL/uL   Hemoglobin 10.3 (L) 13.0 - 17.0 g/dL   HCT 68.4 (L) 60.9 - 47.9 %   MCV 96.6 80.0 - 100.0 fL   MCH 31.6 26.0 - 34.0 pg   MCHC 32.7 30.0 - 36.0 g/dL   RDW 87.2 88.4 - 84.4 %   Platelets 153 150 - 400 K/uL   nRBC 0.0 0.0 - 0.2 %   Neutrophils Relative % 56 %   Neutro Abs 4.1 1.7 - 7.7 K/uL   Lymphocytes Relative 29 %   Lymphs Abs 2.2 0.7 - 4.0 K/uL   Monocytes Relative 11 %   Monocytes Absolute 0.9 0.1 - 1.0 K/uL   Eosinophils Relative 4 %   Eosinophils Absolute 0.3 0.0 - 0.5 K/uL   Basophils Relative 0 %   Basophils Absolute 0.0 0.0 - 0.1 K/uL   Immature Granulocytes 0 %   Abs Immature Granulocytes 0.01 0.00 - 0.07 K/uL    Comment: Performed at [facility], [address] [place], [place] [address]  Comprehensive metabolic panel     Status: Abnormal   Collection Time: 11/02/24  7:36 AM  Result Value Ref Range   Sodium 138 135 - 145 mmol/L   Potassium 4.4 3.5 - 5.1 mmol/L   Chloride 107 98 - 111 mmol/L   CO2 27 22 - 32 mmol/L   Glucose, Bld 153 (H) 70 - 99 mg/dL    Comment: Glucose reference range applies only to samples taken after fasting for at least 8 hours.   BUN 34 (H) 8 - 23 mg/dL   Creatinine, Ser 7.97 (H) 0.61 - 1.24 mg/dL   Calcium  8.5 (L) 8.9 - 10.3 mg/dL   Total Protein 7.2 6.5 - 8.1 g/dL    Albumin 3.6 3.5 - 5.0 g/dL   AST 22 15 - 41 U/L   ALT 23 0 - 44 U/L   Alkaline Phosphatase 72 38 - 126 U/L   Total Bilirubin 0.8 0.0 - 1.2 mg/dL  GFR, Estimated 34 (L) >60 mL/min    Comment: (NOTE) Calculated using the CKD-EPI Creatinine Equation (2021)    Anion gap 4 (L) 5 - 15    Comment: Performed at [facility], [address], [place], [place] [address]  CK     Status: None   Collection Time: 11/02/24  7:36 AM  Result Value Ref Range   Total CK 134 49 - 397 U/L    Comment: Performed at [facility], [address] [place], [place] [address]  TSH     Status: None   Collection Time: 11/02/24  7:37 AM  Result Value Ref Range   TSH 2.449 0.350 - 4.500 uIU/mL    Comment: Performed by a 3rd Generation assay with a functional sensitivity of <=0.01 uIU/mL. Performed at [facility], [address], [place], [place] [address]   I-Stat CG4 Lactic Acid     Status: Abnormal   Collection Time: 11/02/24 10:43 AM  Result Value Ref Range   Lactic Acid, Venous 0.4 (L) 0.5 - 1.9 mmol/L   *Note: Due to a large number of results and/or encounters for the requested time period, some results have not been displayed. A complete set of results can be found in Results Review.   MR BRAIN WO CONTRAST Result Date: 11/02/2024 EXAM: MRI BRAIN WITHOUT CONTRAST 11/02/2024 01:52:46 PM TECHNIQUE: Multiplanar multisequence MRI of the head/brain was performed without the administration of intravenous contrast. COMPARISON: Head CT 11/02/2024 and MRI 10/11/2012. CLINICAL HISTORY: Neuro deficit, acute, stroke suspected; Patient has left-sided deficits and CT shows possible age-indeterminate stroke. He did fall when his left leg gave out on him. Rule out new stroke. FINDINGS: BRAIN AND VENTRICLES: No acute infarct, mass, midline shift, hydrocephalus, or extra-axial fluid collection is identified. There is a chronic infarct involving the right thalamus and posterior limb of the right internal  capsule with associated chronic blood products, and this infarct was acute in 2013. A chronic microhemorrhage in the right occipital lobe is unchanged. Patchy T2 hyperintensities in the cerebral white matter and pons have mildly progressed from 2013 and are nonspecific but compatible with moderate chronic small vessel ischemic disease. There are multiple chronic lacunar infarcts in the basal ganglia and cerebellum bilaterally which are largely new from 2013. There is mild generalized cerebral atrophy. Major intracranial vascular flow voids are preserved. ORBITS: Bilateral cataract extraction. SINUSES AND MASTOIDS: Chronic right maxillary sinusitis with sinus expansion and bowing into the nasal cavity as described on CT. Extensive right anterior and mid ethmoid air cell opacification as well. No significant mastoid fluid. BONES AND SOFT TISSUES: Normal marrow signal. No acute soft tissue abnormality. IMPRESSION: 1. No acute intracranial abnormality. 2. Chronic right thalamic infarct. 3. Moderate chronic small vessel ischemic disease. 4. Small chronic cerebellar infarcts. Electronically signed by: Dasie Hamburg MD 11/02/2024 02:11 PM EST RP Workstation: [id number]   US  SCROTUM W/DOPPLER Result Date: 11/02/2024 CLINICAL DATA:  Left groin pain. EXAM: SCROTAL ULTRASOUND DOPPLER ULTRASOUND OF THE TESTICLES TECHNIQUE: Complete ultrasound examination of the testicles, epididymis, and other scrotal structures was performed. Color and spectral Doppler ultrasound were also utilized to evaluate blood flow to the testicles. COMPARISON:  None Available. FINDINGS: Right testicle Measurements: 3.0 x 1.7 x 2.4 cm. No mass or microlithiasis visualized. Doppler: There is normal vascularity on color doppler examination. Spectral doppler arterial and venous waveforms are normal. Left testicle Measurements:  3.0 x 1.9 x 2.6 cm. No mass or microlithiasis visualized. Doppler: There is normal  vascularity on color doppler examination.  Spectral doppler arterial and venous waveforms are normal. Right epididymis:  Normal in size and appearance. Left epididymis: Normal in size and appearance. 3 mm epididymal head cyst. Hydrocele:  None visualized. Varicocele:  None visualized. IMPRESSION: No acute abnormality identified. Electronically Signed   By: Harrietta Sherry M.D.   On: 11/02/2024 13:02   CT ABDOMEN PELVIS W CONTRAST Result Date: 11/02/2024 EXAM: CT ABDOMEN AND PELVIS WITH CONTRAST 11/02/2024 11:09:09 AM TECHNIQUE: CT of the abdomen and pelvis was performed with the administration of 60 mL of iohexol  (OMNIPAQUE ) 350 MG/ML injection. Multiplanar reformatted images are provided for review. Automated exposure control, iterative reconstruction, and/or weight-based adjustment of the mA/kV was utilized to reduce the radiation dose to as low as reasonably achievable. COMPARISON: Comparison is made to 07/14/2023. CLINICAL HISTORY: Left inguinal tenderness and possible hernia. FINDINGS: LOWER CHEST: No acute abnormality. LIVER: The liver is unremarkable. GALLBLADDER AND BILE DUCTS: Cholelithiasis without superimposed pericholecystic inflammatory change. No intra or extrahepatic biliary ductal dilation. SPLEEN: No acute abnormality. PANCREAS: Lobulated cystic lesion is again seen within the uncinate process of the pancreas measuring 2.0 x 2.4 cm, mildly enlarged since prior examination. Several small septa are noted and the lesion may represent a serous cystadenoma, but is not optimally characterized on this examination. If indicated, this will be better assessed with dedicated contrast enhanced MRI examination. ADRENAL GLANDS: No acute abnormality. KIDNEYS, URETERS AND BLADDER: No stones in the kidneys or ureters. No hydronephrosis. No perinephric or periureteral stranding. Urinary bladder is unremarkable. GI AND BOWEL: Small left inguinal hernia contains a single loop of unremarkable mid small bowel. Small fat-containing right inguinal hernia. The  stomach, small bowel, and large bowel are otherwise unremarkable. Appendix normal. There is no bowel obstruction. PERITONEUM AND RETROPERITONEUM: No ascites. No free air. VASCULATURE: Aorta is normal in caliber. Mild aortoiliac atherosclerotic calcification. No aortic aneurysm. LYMPH NODES: No lymphadenopathy. REPRODUCTIVE ORGANS: No acute abnormality. BONES AND SOFT TISSUES: No acute osseous abnormality. No focal soft tissue abnormality. IMPRESSION: 1. Small left inguinal hernia containing a single loop of unremarkable mid small bowel. No superimposed inflammatory changes. 2. Small fat-containing right inguinal hernia. 3. Lobulated cystic lesion within the uncinate process of the pancreas, mildly enlarged since prior examination, indeterminate; recommend pancreas-protocol MRI for further characterization, if clinically indicated . Electronically signed by: Dorethia Molt MD 11/02/2024 11:55 AM EST RP Workstation: [id number]   DG Chest 2 View Result Date: 11/02/2024 CLINICAL DATA:  Fall EXAM: CHEST - 2 VIEW COMPARISON:  March 09, 2024 FINDINGS: The heart size and mediastinal contours are within normal limits. Both lungs are clear. The visualized skeletal structures are unremarkable. IMPRESSION: No active cardiopulmonary disease. Electronically Signed   By: Lynwood Landy Raddle M.D.   On: 11/02/2024 08:56   DG Knee Complete 4 Views Left Result Date: 11/02/2024 CLINICAL DATA:  Fall EXAM: LEFT KNEE - COMPLETE 4+ VIEW COMPARISON:  None Available. FINDINGS: No evidence of fracture, dislocation, or joint effusion. Status post ACL reconstruction. Mild patellar spurring. Moderate narrowing of medial joint space. Soft tissues are unremarkable. IMPRESSION: No acute abnormality seen. Electronically Signed   By: Lynwood Landy Raddle M.D.   On: 11/02/2024 08:55   DG Femur Min 2 Views Left Result Date: 11/02/2024 CLINICAL DATA:  Fall EXAM: LEFT FEMUR 2 VIEWS COMPARISON:  None Available. FINDINGS: There is no evidence of fracture  or other focal bone lesions. Soft tissues are unremarkable. Status post ACL reconstruction. IMPRESSION: No acute abnormality seen. Electronically Signed  By: Lynwood Landy Raddle M.D.   On: 11/02/2024 08:54   DG Pelvis 1-2 Views Result Date: 11/02/2024 CLINICAL DATA:  Fall EXAM: PELVIS - 1-2 VIEW COMPARISON:  None Available. FINDINGS: There is no evidence of pelvic fracture or diastasis. No pelvic bone lesions are seen. IMPRESSION: Negative. Electronically Signed   By: Lynwood Landy Raddle M.D.   On: 11/02/2024 08:52   CT Cervical Spine Wo Contrast Result Date: 11/02/2024 EXAM: CT CERVICAL SPINE WITHOUT CONTRAST 11/02/2024 08:03:00 AM TECHNIQUE: CT of the cervical spine was performed without the administration of intravenous contrast. Multiplanar reformatted images are provided for review. Automated exposure control, iterative reconstruction, and/or weight based adjustment of the mA/kV was utilized to reduce the radiation dose to as low as reasonably achievable. COMPARISON: None available. CLINICAL HISTORY: Polytrauma, blunt Polytrauma, blunt FINDINGS: CERVICAL SPINE: BONES AND ALIGNMENT: No acute fracture or traumatic malalignment. DEGENERATIVE CHANGES: No significant degenerative changes. SOFT TISSUES: No prevertebral soft tissue swelling. IMPRESSION: 1. No acute abnormality of the cervical spine. Electronically signed by: Evalene Coho MD 11/02/2024 08:09 AM EST RP Workstation: [id number]   CT Head Wo Contrast Result Date: 11/02/2024 EXAM: CT HEAD WITHOUT 11/02/2024 08:03:00 AM TECHNIQUE: CT of the head was performed without the administration of intravenous contrast. Automated exposure control, iterative reconstruction, and/or weight based adjustment of the mA/kV was utilized to reduce the radiation dose to as low as reasonably achievable. COMPARISON: CT of the head dated 10/10/2012. CLINICAL HISTORY: Head trauma, minor (Age >= 65y). FINDINGS: BRAIN AND VENTRICLES: No acute intracranial hemorrhage. No mass  effect or midline shift. No extra-axial fluid collection. No evidence of acute infarct. No hydrocephalus. Age indeterminate infarct in the right thalamus. There is age-related atrophy and mild-to-moderate cerebral white matter disease. There is no evidence of acute intracranial injury. ORBITS: No acute abnormality. Bilateral lens replacement noted. SINUSES AND MASTOIDS: Right maxillary sinus: Complete opacification with hyperdensity, radiodense debris, and calcification, suggesting chronic sinusitis. Dehiscence and bowing of the medial wall. Left maxillary sinus: Polypoid mucosal thickening within the floor. Right ethmoid sinus/air cells: Mucosal thickening and moderate opacification. SOFT TISSUES AND SKULL: No acute skull fracture. No acute soft tissue abnormality. IMPRESSION: 1. No acute intracranial abnormality. 2. Age indeterminate infarct in the right thalamus. 3. 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 Polypoid mucosal thickening in the left maxillary sinus. Electronically signed by: Evalene Coho MD 11/02/2024 08:08 AM EST RP Workstation: [id number]      Assessment/Plan 73 y/o M who presents with symptomatic left inguinal hernia and fall  Left inguinal hernia containing small bowel - no evidence of bowel obstruction or strangulation at this time. Unable to reduce. No emergent nature for hernia repair but he will need this fixed during his hospital stay, as long as no medical contraindication to surgery, to avoid complications like bowel strangulation, obstruction, perforation. Fat containing right inguinal hernia - no acute surgical needs  FEN - ok for CLD, advance as tolerated to fulls. Would avoid solid diet in this patient with a bowel containing hernia VTE - SCD's, ok for chemical VTE ppx ID - no abx indicated from a surgical standpoint Admit - to medical service   Insulin  dependent diabetes  with complications (see below) CAD PMH CVA CKD Diabetic retinopathy  I reviewed EDP notes, nursing notes, last 24 h vitals and pain scores, last 48 h intake and output, last 24 h labs and trends, and last 24 h imaging results.  Marjorie Favre, PA-C [facility] 11/02/2024, 3:45 PM Please see Amion for pager number during day hours 7:00am-4:30pm or 7:00am -11:30am on weekends      [1]  Allergies Allergen Reactions   Penicillins Anaphylaxis   Pneumococcal Vaccines Anaphylaxis   Shellfish Allergy Anaphylaxis   Barbiturates    Gabapentin      Urinary retention   Influenza Vaccines    Lisinopril  Cough   Other Nausea And Vomiting    Pt wife states that dye that is used for eye exam in dr    Sildenafil Other (See Comments) and Nausea And Vomiting    Other reaction(s): Headache, Dizziness   Topiramate Other (See Comments)    Chest spasms and numbness   Zofran  [Ondansetron ]    Amlodipine  Other (See Comments)    LE edema   Metformin  Nausea And Vomiting   Tadalafil Other (See Comments)    dizziness   Vardenafil Other (See Comments)    headaches

## 2024-11-02 NOTE — Progress Notes (Signed)
 PT Cancellation Note  Patient Details Name: [name] MRN: [id number] DOB: 07/07/1951   Cancelled Treatment:    Reason Eval/Treat Not Completed: Other (comment). Pt to be admitted per discussion with MD, PT to follow up tomorrow for evaluation.   Bernardino JINNY Ruth 11/02/2024, 4:31 PM

## 2024-11-02 NOTE — H&P (Signed)
 History and Physical    Patient: [name] FMW:[id number] DOB: Jan 25, 1951 DOA: 11/02/2024 DOS: the patient was seen and examined on 11/02/2024 . PCP: Geofm Glade PARAS, MD  Patient coming from: Home Chief complaint: Chief Complaint  Patient presents with   Fall   HPI:  [name] is a 73 y.o. male with past medical history  of diabetes mellitus type 2 on insulin , essential hypertension, history of CVA on DAPT with aspirin  and Plavix , coming in for left leg pain that has been his left thigh and left hip and left knee.Left thigh is tender to touch.  Coming today for left knee pain that was acute onset climbing into fall on his elbow.  No dizziness sedation or depression no paresis no nausea.  No chest pain no potation no falls.  Initial vitals with EMS showed systolic blood pressure of 160 diastolic 100 heart rate 65 respirations 16 O2 sats 99% temperature 97.9. Patient at bedside reported left inguinal area tenderness and found to have inguinal hernia that was not reducible at bedside by EDMD.  General surgery was consulted for admission.  Surgery in the next few days.  ED Course:  Vital signs in the ED were notable for the following:  Vitals:   11/02/24 1430 11/02/24 1445 11/02/24 1500 11/02/24 1645  BP: 132/74 (!) 140/79 (!) 146/73 132/61  Pulse: 65 71 68 65  Temp:    98.6 F (37 C)  Resp: 15 18 (!) 27 18  SpO2: 100% 99% 100% 100%  TempSrc:    Oral   >>ED evaluation thus far shows: Patient had an MRI of the brain today that was negative for any acute intracranial abnormality and does show chronic right thalamic with moderate chronic small vessel ischemic disease and small chronic cerebellar infarcts. Ultrasound of the scrotum with Doppler today shows no mass and microlithiasis with normal vascularity and normal size.  No hydrocele patient does have 3 mm epididymal head cyst. CT of the abdomen and pelvis with contrast reveals a left inguinal hernia containing a  single loop of small bowel no superimposed inflammatory changes patient also has a small fat-containing right inguinal hernia patient also found to have a cystic lesion in the uncinate process of indications for an MRI. Chest x-ray is negative for any acute cardiopulmonary abnormality. X-ray of the left knee and femur negative for any acute abnormality. Pelvis x-ray is negative for any acute fracture or diastases. CT spine is negative for any acute abnormality. Head CT is negative for any acute intracranial abnormality, age-indeterminate infarct in the right thalamus, chronic sinusitis of the right maxillary and ethmoid sinus.  Patient also has polypoid mucosal thickening in the left maxillary sinus. - CMP shows glucose of 153 BUN of 24 creatinine 2.02 normal LFTs. - CPK 134. - Lactic acid 0.4. - CBC shows hemoglobin of 10.3 otherwise within normal limits. - Urinalysis shows clear urine with trace hemoglobin 0-5 WBCs 3-5 RBCs and rare bacteria. -      >>While in the ED patient received the following: Medications  fentaNYL  (SUBLIMAZE ) injection 50 mcg (50 mcg Intravenous Given 11/02/24 0856)  iohexol  (OMNIPAQUE ) 350 MG/ML injection 60 mL (60 mLs Intravenous Contrast Given 11/02/24 1104)  acetaminophen  (TYLENOL ) tablet 650 mg (650 mg Oral Given 11/02/24 1651)  cyclobenzaprine (FLEXERIL) tablet 5 mg (5 mg Oral Given 11/02/24 1651)   Review of Systems  Genitourinary:        Left groin pain and swelling.    Musculoskeletal:  Positive for myalgias.  Past Medical History:  Diagnosis Date   ALLERGIC RHINITIS    CAD, NATIVE VESSEL    BMS to OM1 2001, DES to BMS 2005   Chronic back pain    herniated disc   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   Chronic kidney disease    Constipation    takes Carafate  four times day   DIAB W/UNSPEC COMP TYPE II/UNSPEC TYPE UNCNTRL    ERECTILE DYSFUNCTION    GERD    HYPERLIPIDEMIA-MIXED    HYPERTENSION, BENIGN     LBBB (left bundle branch block) 02/02/2016   MORTON'S NEUROMA, RIGHT    OSA on CPAP 09/09/2020   Peripheral neuropathy    Seasonal allergies    takes Allegra and Benadryl  daily prn;uses Flonase  daily   SHOULDER PAIN, RIGHT    Snoring 09/09/2020   Stroke, 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, s/p CIR    TIA on medication 02/2012   Unstable angina (HCC) 07/09/2020   Past Surgical History:  Procedure Laterality Date   ANGIOPLASTY     COLONOSCOPY     CORONARY ANGIOPLASTY  2005   2 stents   CORONARY STENT INTERVENTION N/A 07/10/2020   Procedure: CORONARY STENT INTERVENTION;  Surgeon: Jordan, Peter M, MD;  Location: [facility] INVASIVE CV LAB;  Service: Cardiovascular;  Laterality: N/A;   DENTAL SURGERY     LARYNGOPLASTY  08/07/2012   Procedure: LARYNGOPLASTY;  Surgeon: Ida Loader, MD;  Location: [facility];  Service: ENT;  Laterality: Left;  Left Vocal Cord Medialyzation   LEFT HEART CATH AND CORONARY ANGIOGRAPHY N/A 07/10/2020   Procedure: LEFT HEART CATH AND CORONARY ANGIOGRAPHY;  Surgeon: Jordan, Peter M, MD;  Location: [facility] INVASIVE CV LAB;  Service: Cardiovascular;  Laterality: N/A;   left knee surgury     x 2    stent  2001, 2004   coronary stents    reports that he quit smoking about 41 years ago. His smoking use included cigarettes. He started smoking about 50 years ago. He has a 22.5 pack-year smoking history. He has never used smokeless tobacco. He reports that he does not drink alcohol and does not use drugs. Allergies[1] Family History  Problem Relation Age of Onset   Cancer Mother    Liver cancer Brother    Colon cancer Neg Hx    Esophageal cancer Neg Hx    Prior to Admission medications  Medication Sig Start Date End Date Taking? Authorizing Provider  aspirin  EC 81 MG tablet Take 1 tablet (81 mg total) by mouth daily. Swallow whole. 07/10/20   Dunn, Dayna N, PA-C  atorvastatin  (LIPITOR) 40 MG tablet TAKE 1 TABLET EVERY DAY 02/20/24   Walker, [name], NP  Blood Pressure  Monitoring (BLOOD PRESSURE CUFF) MISC 1 Units by Does not apply route daily. 02/03/21   Raford Riggs, MD  budesonide -formoterol  (SYMBICORT ) 80-4.5 MCG/ACT inhaler 2 puffs BID and prn shortness of breath or wheezing 03/09/24   Kabbe, Angela M, NP  calcium  carbonate (TUMS - DOSED IN MG ELEMENTAL CALCIUM ) 500 MG chewable tablet Chew 1 tablet by mouth daily as needed for indigestion or heartburn.    [provider]  carvedilol  (COREG ) 25 MG tablet TAKE 1 TABLET TWICE DAILY 08/30/24   Raford Riggs, MD  chlorthalidone  (HYGROTON ) 25 MG tablet Take 1 tablet (25 mg total) by mouth daily. 09/18/24   Raford Riggs, MD  clopidogrel  (PLAVIX ) 75 MG tablet TAKE 1 TABLET EVERY DAY 07/17/24   Raford Riggs, MD  dextrose  (GLUCO TO GO 15) 40 % GEL Take 1 Tube by mouth once as needed for low blood sugar.    [provider]  fluticasone  (FLONASE ) 50 MCG/ACT nasal spray Place 2 sprays into both nostrils daily. 03/09/24   Richad Jon HERO, NP  furosemide  (LASIX ) 40 MG tablet TAKE 1 TABLET (40 MG TOTAL) BY MOUTH DAILY. NEED APPT. 09/16/21   Raford Riggs, MD  glucose blood (ACCU-CHEK AVIVA PLUS) test strip uad tid 12/14/21   Geofm Glade PARAS, MD  insulin  glargine (LANTUS  SOLOSTAR) 100 UNIT/ML Solostar Pen Inject 7-8 Units into the skin daily. 07/31/24   Trixie File, MD  insulin  lispro (HUMALOG  KWIKPEN) 100 UNIT/ML KwikPen Inject 6-10 Units into the skin 2 (two) times daily before a meal. 12/09/23   Trixie File, MD  Insulin  Pen Needle 32G X 4 MM MISC Use 4x a day 07/10/24   Trixie File, MD  losartan  (COZAAR ) 100 MG tablet TAKE 1 TABLET EVERY DAY 01/23/24   Raford Riggs, MD  Menthol , Topical Analgesic, 4 % GEL Apply topically.    [provider]  Microlet Lancets MISC UAD to check sugars TID.  E11.22 12/14/21   Geofm Glade PARAS, MD  nitroGLYCERIN  (NITROSTAT ) 0.4 MG SL tablet Place 1 tablet (0.4 mg total) under the tongue every 5 (five) minutes as needed for chest pain (up  to 3 doses). 12/23/22 10/01/24  Raford Riggs, MD  senna-docusate (SENOKOT-S) 8.6-50 MG per tablet Take 2 tablets by mouth 2 (two) times daily. For constipation. 09/07/12   Maurice Sharlet RAMAN, PA-C  Spacer/Aero-Holding Chambers DEVI 1 each by Does not apply route as needed. 03/09/24   Richad Jon HERO, NP                                                                                 Vitals:   11/02/24 1430 11/02/24 1445 11/02/24 1500 11/02/24 1645  BP: 132/74 (!) 140/79 (!) 146/73 132/61  Pulse: 65 71 68 65  Resp: 15 18 (!) 27 18  Temp:    98.6 F (37 C)  TempSrc:    Oral  SpO2: 100% 99% 100% 100%   Physical Exam Vitals reviewed.  Constitutional:      General: He is not in acute distress.    Appearance: He is not ill-appearing.  HENT:     Head: Normocephalic.  Eyes:     Extraocular Movements: Extraocular movements intact.  Cardiovascular:     Rate and Rhythm: Normal rate and regular rhythm.     Heart sounds: Normal heart sounds.  Pulmonary:     Breath sounds: Normal breath sounds.  Abdominal:     General: There is no distension.     Palpations: Abdomen is soft.     Tenderness: There is no abdominal tenderness.  Musculoskeletal:     Right lower leg: No edema.     Left lower leg: No edema.       Legs:  Neurological:     General: No focal deficit present.     Mental Status: He is alert and oriented to person, place, and time.     Labs on Admission: I have personally reviewed following labs and imaging studies CBC:  Recent Labs  Lab 11/02/24 0736  WBC 7.4  NEUTROABS 4.1  HGB 10.3*  HCT 31.5*  MCV 96.6  PLT 153   Basic Metabolic Panel: Recent Labs  Lab 11/02/24 0736  NA 138  K 4.4  CL 107  CO2 27  GLUCOSE 153*  BUN 34*  CREATININE 2.02*  CALCIUM  8.5*   GFR: CrCl cannot be calculated (Unknown ideal weight.). Liver Function Tests: Recent Labs  Lab 11/02/24 0736  AST 22  ALT 23  ALKPHOS 72  BILITOT 0.8  PROT 7.2  ALBUMIN 3.6   No results for  input(s): LIPASE, AMYLASE in the last 168 hours. No results for input(s): AMMONIA in the last 168 hours. Recent Labs    12/14/23 2137 01/18/24 1057 11/02/24 0736  BUN 27* 34* 34*  CREATININE 2.21* 2.00* 2.02*    Cardiac Enzymes: Recent Labs  Lab 11/02/24 0736  CKTOTAL 134   BNP (last 3 results) No results for input(s): PROBNP in the last 8760 hours. HbA1C: No results for input(s): HGBA1C in the last 72 hours. CBG: Recent Labs  Lab 11/02/24 0641  GLUCAP 147*   Lipid Profile: No results for input(s): CHOL, HDL, LDLCALC, TRIG, CHOLHDL, LDLDIRECT in the last 72 hours. Thyroid  Function Tests: Recent Labs    11/02/24 0737  TSH 2.449   Anemia Panel: No results for input(s): VITAMINB12, FOLATE, FERRITIN, TIBC, IRON, RETICCTPCT in the last 72 hours. Urine analysis:    Component Value Date/Time   COLORURINE YELLOW 11/02/2024 0737   APPEARANCEUR CLEAR 11/02/2024 0737   LABSPEC 1.010 11/02/2024 0737   PHURINE 6.0 11/02/2024 0737   GLUCOSEU NEGATIVE 11/02/2024 0737   HGBUR TRACE (A) 11/02/2024 0737   BILIRUBINUR NEGATIVE 11/02/2024 0737   BILIRUBINUR negative 11/06/2021 0918   KETONESUR NEGATIVE 11/02/2024 0737   PROTEINUR NEGATIVE 11/02/2024 0737   UROBILINOGEN 0.2 11/06/2021 0918   UROBILINOGEN 0.2 04/07/2012 1648   NITRITE NEGATIVE 11/02/2024 0737   LEUKOCYTESUR NEGATIVE 11/02/2024 0737   Radiological Exams on Admission: MR BRAIN WO CONTRAST Result Date: 11/02/2024 EXAM: MRI BRAIN WITHOUT CONTRAST 11/02/2024 01:52:46 PM TECHNIQUE: Multiplanar multisequence MRI of the head/brain was performed without the administration of intravenous contrast. COMPARISON: Head CT 11/02/2024 and MRI 10/11/2012. CLINICAL HISTORY: Neuro deficit, acute, stroke suspected; Patient has left-sided deficits and CT shows possible age-indeterminate stroke. He did fall when his left leg gave out on him. Rule out new stroke. FINDINGS: BRAIN AND VENTRICLES: No  acute infarct, mass, midline shift, hydrocephalus, or extra-axial fluid collection is identified. There is a chronic infarct involving the right thalamus and posterior limb of the right internal capsule with associated chronic blood products, and this infarct was acute in 2013. A chronic microhemorrhage in the right occipital lobe is unchanged. Patchy T2 hyperintensities in the cerebral white matter and pons have mildly progressed from 2013 and are nonspecific but compatible with moderate chronic small vessel ischemic disease. There are multiple chronic lacunar infarcts in the basal ganglia and cerebellum bilaterally which are largely new from 2013. There is mild generalized cerebral atrophy. Major intracranial vascular flow voids are preserved. ORBITS: Bilateral cataract extraction. SINUSES AND MASTOIDS: Chronic right maxillary sinusitis with sinus expansion and bowing into the nasal cavity as described on CT. Extensive right anterior and mid ethmoid air cell opacification as well. No significant mastoid fluid. BONES AND SOFT TISSUES: Normal marrow signal. No acute soft tissue abnormality. IMPRESSION: 1. No acute intracranial abnormality. 2. Chronic right thalamic infarct. 3. Moderate chronic small vessel ischemic disease. 4. Small chronic cerebellar  infarcts. Electronically signed by: Dasie Hamburg MD 11/02/2024 02:11 PM EST RP Workstation: [id number]   US  SCROTUM W/DOPPLER Result Date: 11/02/2024 CLINICAL DATA:  Left groin pain. EXAM: SCROTAL ULTRASOUND DOPPLER ULTRASOUND OF THE TESTICLES TECHNIQUE: Complete ultrasound examination of the testicles, epididymis, and other scrotal structures was performed. Color and spectral Doppler ultrasound were also utilized to evaluate blood flow to the testicles. COMPARISON:  None Available. FINDINGS: Right testicle Measurements: 3.0 x 1.7 x 2.4 cm. No mass or microlithiasis visualized. Doppler: There is normal vascularity on color doppler examination. Spectral doppler  arterial and venous waveforms are normal. Left testicle Measurements:  3.0 x 1.9 x 2.6 cm. No mass or microlithiasis visualized. Doppler: There is normal vascularity on color doppler examination. Spectral doppler arterial and venous waveforms are normal. Right epididymis:  Normal in size and appearance. Left epididymis: Normal in size and appearance. 3 mm epididymal head cyst. Hydrocele:  None visualized. Varicocele:  None visualized. IMPRESSION: No acute abnormality identified. Electronically Signed   By: Harrietta Sherry M.D.   On: 11/02/2024 13:02   CT ABDOMEN PELVIS W CONTRAST Result Date: 11/02/2024 EXAM: CT ABDOMEN AND PELVIS WITH CONTRAST 11/02/2024 11:09:09 AM TECHNIQUE: CT of the abdomen and pelvis was performed with the administration of 60 mL of iohexol  (OMNIPAQUE ) 350 MG/ML injection. Multiplanar reformatted images are provided for review. Automated exposure control, iterative reconstruction, and/or weight-based adjustment of the mA/kV was utilized to reduce the radiation dose to as low as reasonably achievable. COMPARISON: Comparison is made to 07/14/2023. CLINICAL HISTORY: Left inguinal tenderness and possible hernia. FINDINGS: LOWER CHEST: No acute abnormality. LIVER: The liver is unremarkable. GALLBLADDER AND BILE DUCTS: Cholelithiasis without superimposed pericholecystic inflammatory change. No intra or extrahepatic biliary ductal dilation. SPLEEN: No acute abnormality. PANCREAS: Lobulated cystic lesion is again seen within the uncinate process of the pancreas measuring 2.0 x 2.4 cm, mildly enlarged since prior examination. Several small septa are noted and the lesion may represent a serous cystadenoma, but is not optimally characterized on this examination. If indicated, this will be better assessed with dedicated contrast enhanced MRI examination. ADRENAL GLANDS: No acute abnormality. KIDNEYS, URETERS AND BLADDER: No stones in the kidneys or ureters. No hydronephrosis. No perinephric or  periureteral stranding. Urinary bladder is unremarkable. GI AND BOWEL: Small left inguinal hernia contains a single loop of unremarkable mid small bowel. Small fat-containing right inguinal hernia. The stomach, small bowel, and large bowel are otherwise unremarkable. Appendix normal. There is no bowel obstruction. PERITONEUM AND RETROPERITONEUM: No ascites. No free air. VASCULATURE: Aorta is normal in caliber. Mild aortoiliac atherosclerotic calcification. No aortic aneurysm. LYMPH NODES: No lymphadenopathy. REPRODUCTIVE ORGANS: No acute abnormality. BONES AND SOFT TISSUES: No acute osseous abnormality. No focal soft tissue abnormality. IMPRESSION: 1. Small left inguinal hernia containing a single loop of unremarkable mid small bowel. No superimposed inflammatory changes. 2. Small fat-containing right inguinal hernia. 3. Lobulated cystic lesion within the uncinate process of the pancreas, mildly enlarged since prior examination, indeterminate; recommend pancreas-protocol MRI for further characterization, if clinically indicated . Electronically signed by: Dorethia Molt MD 11/02/2024 11:55 AM EST RP Workstation: [id number]   DG Chest 2 View Result Date: 11/02/2024 CLINICAL DATA:  Fall EXAM: CHEST - 2 VIEW COMPARISON:  March 09, 2024 FINDINGS: The heart size and mediastinal contours are within normal limits. Both lungs are clear. The visualized skeletal structures are unremarkable. IMPRESSION: No active cardiopulmonary disease. Electronically Signed   By: Lynwood Landy Raddle M.D.   On: 11/02/2024 08:56   DG Knee Complete  4 Views Left Result Date: 11/02/2024 CLINICAL DATA:  Fall EXAM: LEFT KNEE - COMPLETE 4+ VIEW COMPARISON:  None Available. FINDINGS: No evidence of fracture, dislocation, or joint effusion. Status post ACL reconstruction. Mild patellar spurring. Moderate narrowing of medial joint space. Soft tissues are unremarkable. IMPRESSION: No acute abnormality seen. Electronically Signed   By: Lynwood Landy Raddle  M.D.   On: 11/02/2024 08:55   DG Femur Min 2 Views Left Result Date: 11/02/2024 CLINICAL DATA:  Fall EXAM: LEFT FEMUR 2 VIEWS COMPARISON:  None Available. FINDINGS: There is no evidence of fracture or other focal bone lesions. Soft tissues are unremarkable. Status post ACL reconstruction. IMPRESSION: No acute abnormality seen. Electronically Signed   By: Lynwood Landy Raddle M.D.   On: 11/02/2024 08:54   DG Pelvis 1-2 Views Result Date: 11/02/2024 CLINICAL DATA:  Fall EXAM: PELVIS - 1-2 VIEW COMPARISON:  None Available. FINDINGS: There is no evidence of pelvic fracture or diastasis. No pelvic bone lesions are seen. IMPRESSION: Negative. Electronically Signed   By: Lynwood Landy Raddle M.D.   On: 11/02/2024 08:52   CT Cervical Spine Wo Contrast Result Date: 11/02/2024 EXAM: CT CERVICAL SPINE WITHOUT CONTRAST 11/02/2024 08:03:00 AM TECHNIQUE: CT of the cervical spine was performed without the administration of intravenous contrast. Multiplanar reformatted images are provided for review. Automated exposure control, iterative reconstruction, and/or weight based adjustment of the mA/kV was utilized to reduce the radiation dose to as low as reasonably achievable. COMPARISON: None available. CLINICAL HISTORY: Polytrauma, blunt Polytrauma, blunt FINDINGS: CERVICAL SPINE: BONES AND ALIGNMENT: No acute fracture or traumatic malalignment. DEGENERATIVE CHANGES: No significant degenerative changes. SOFT TISSUES: No prevertebral soft tissue swelling. IMPRESSION: 1. No acute abnormality of the cervical spine. Electronically signed by: Evalene Coho MD 11/02/2024 08:09 AM EST RP Workstation: [id number]   CT Head Wo Contrast Result Date: 11/02/2024 EXAM: CT HEAD WITHOUT 11/02/2024 08:03:00 AM TECHNIQUE: CT of the head was performed without the administration of intravenous contrast. Automated exposure control, iterative reconstruction, and/or weight based adjustment of the mA/kV was utilized to reduce the radiation dose to  as low as reasonably achievable. COMPARISON: CT of the head dated 10/10/2012. CLINICAL HISTORY: Head trauma, minor (Age >= 65y). FINDINGS: BRAIN AND VENTRICLES: No acute intracranial hemorrhage. No mass effect or midline shift. No extra-axial fluid collection. No evidence of acute infarct. No hydrocephalus. Age indeterminate infarct in the right thalamus. There is age-related atrophy and mild-to-moderate cerebral white matter disease. There is no evidence of acute intracranial injury. ORBITS: No acute abnormality. Bilateral lens replacement noted. SINUSES AND MASTOIDS: Right maxillary sinus: Complete opacification with hyperdensity, radiodense debris, and calcification, suggesting chronic sinusitis. Dehiscence and bowing of the medial wall. Left maxillary sinus: Polypoid mucosal thickening within the floor. Right ethmoid sinus/air cells: Mucosal thickening and moderate opacification. SOFT TISSUES AND SKULL: No acute skull fracture. No acute soft tissue abnormality. IMPRESSION: 1. No acute intracranial abnormality. 2. Age indeterminate infarct in the right thalamus. 3. 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 Polypoid mucosal thickening in the left maxillary sinus. Electronically signed by: Evalene Coho MD 11/02/2024 08:08 AM EST RP Workstation: [id number]   Data Reviewed: Relevant notes from primary care and specialist visits, past discharge summaries as available in EHR, including Care Everywhere . Prior diagnostic testing as pertinent to current admission diagnoses, Updated medications and problem lists for reconciliation .ED course, including vitals, labs, imaging, treatment and response to treatment,Triage notes, nursing and  pharmacy notes and ED provider's notes.Notable results as noted in HPI.Discussed case with EDMD/ ED APP/ or Specialty MD on call and as needed.  Assessment & Plan  >> Left thigh pain: D/D  include DVT vs MSK, we will get stat usg and follow.  PRN tylenol . Ambulate with assistance.    >>Left inguinal hernia: Appreciate General surgery consult and will hold pt's DAPT for tentative surgery Monday.  PRN pain meds.    >> DM II: Clear liquid diet,  with glycemic protocol.  >>Essential HTN: Vitals:   11/02/24 0639 11/02/24 0640 11/02/24 0900 11/02/24 1241  BP: (!) 146/78 128/61 132/63 (!) 146/94   11/02/24 1428 11/02/24 1430 11/02/24 1445 11/02/24 1500  BP: 114/75 132/74 (!) 140/79 (!) 146/73   11/02/24 1645  BP: 132/61  Continue patient on Coreg , hold patient's chlorthalidone  and losartan .  Hold Patient's Lasix .  >> CAD S/P: Continue statin and hold antiplatelet anticoagulation Plavix  and aspirin .   >>GERD: Aspiration precaution and IV PPI.  >>H/O CVA: Exam is nonfocal, currently DAPT held.   >> CKD stage IIIb: Lab Results  Component Value Date   CREATININE 2.02 (H) 11/02/2024   CREATININE 2.00 (H) 01/18/2024   CREATININE 2.21 (H) 12/14/2023  Stable, will avoid contrast studies and renally dose needed medications.   >> Chronic diastolic congestive heart failure:  Stated patient is clinically euvolemic.  Continue patient on Coreg , Strict I/O. Daily weights. Sodium restriction.    DVT prophylaxis:  Heparin  every 12 x 4 doses.  Consults:  General surgery.   Advance Care Planning:    Code Status: Full Code   Family Communication:  Daughter.  Disposition Plan:  Home.  Severity of Illness: The appropriate patient status for this patient is INPATIENT. Inpatient status is judged to be reasonable and necessary in order to provide the required intensity of service to ensure the patient's safety. The patient's presenting symptoms, physical exam findings, and initial radiographic and laboratory data in the context of their chronic comorbidities is felt to place them at high risk for further clinical deterioration. Furthermore, it is not anticipated that the  patient will be medically stable for discharge from the hospital within 2 midnights of admission.   * I certify that at the point of admission it is my clinical judgment that the patient will require inpatient hospital care spanning beyond 2 midnights from the point of admission due to high intensity of service, high risk for further deterioration and high frequency of surveillance required.*  Unresulted Labs (From admission, onward)     Start     Ordered   11/03/24 0500  CBC  Daily,   R      11/02/24 1832   11/03/24 0500  Comprehensive metabolic panel  Daily,   R      11/02/24 1832   11/02/24 1835  D-dimer, quantitative  Add-on,   AD        11/02/24 1834   11/02/24 1833  Magnesium   Add-on,   AD        11/02/24 1832            Meds ordered this encounter  Medications   fentaNYL  (SUBLIMAZE ) injection 50 mcg   iohexol  (OMNIPAQUE ) 350 MG/ML injection 60 mL   acetaminophen  (TYLENOL ) tablet 650 mg   cyclobenzaprine (FLEXERIL) tablet 5 mg   atorvastatin  (LIPITOR) tablet 40 mg   carvedilol  (COREG ) tablet 25 mg   fluticasone  (FLONASE ) 50 MCG/ACT nasal spray 2 spray   DISCONTD: insulin  aspart (novoLOG ) injection 0-15  Units    Correction coverage::   Moderate (average weight, post-op)    CBG < 70::   implement hypoglycemia protocol    CBG 70 - 120::   0 units    CBG 121 - 150::   2 units    CBG 151 - 200::   3 units    CBG 201 - 250::   5 units    CBG 251 - 300::   8 units    CBG 301 - 350::   11 units    CBG 351 - 400::   15 units    CBG > 400:   call MD and obtain STAT lab verification   heparin  injection 5,000 Units   sodium chloride  flush (NS) 0.9 % injection 3 mL   0.9 %  sodium chloride  infusion   OR Linked Order Group    acetaminophen  (TYLENOL ) tablet 650 mg    acetaminophen  (TYLENOL ) suppository 650 mg   DISCONTD: HYDROcodone -acetaminophen  (NORCO/VICODIN) 5-325 MG per tablet 1 tablet    Refill:  0   DISCONTD: morphine  (PF) 2 MG/ML injection 2 mg   fentaNYL  (SUBLIMAZE )  injection 25 mcg   fluticasone  furoate-vilanterol (BREO ELLIPTA) 100-25 MCG/ACT 1 puff   insulin  aspart (novoLOG ) injection 0-9 Units    Correction coverage::   Sensitive (thin, NPO, renal)    CBG < 70::   Implement Hypoglycemia Standing Orders and refer to Hypoglycemia Standing Orders sidebar report    CBG 70 - 120::   0 units    CBG 121 - 150::   1 unit    CBG 151 - 200::   2 units    CBG 201 - 250::   3 units    CBG 251 - 300::   5 units    CBG 301 - 350::   7 units    CBG 351 - 400:   9 units    CBG > 400:   call MD and obtain STAT lab verification   HYDROcodone -acetaminophen  (NORCO/VICODIN) 5-325 MG per tablet 1 tablet    Refill:  0     Orders Placed This Encounter  Procedures   CT Head Wo Contrast   CT Cervical Spine Wo Contrast   DG Chest 2 View   DG Femur Min 2 Views Left   DG Knee Complete 4 Views Left   DG Pelvis 1-2 Views   MR BRAIN WO CONTRAST   CT ABDOMEN PELVIS W CONTRAST   US  SCROTUM W/DOPPLER   CBC with Differential   Comprehensive metabolic panel   CK   TSH   Urinalysis, w/ Reflex to Culture (Infection Suspected) -Urine, Clean Catch   Magnesium    CBC   Comprehensive metabolic panel   D-dimer, quantitative   Diet clear liquid Room service appropriate? Yes; Fluid consistency: Thin   Apply Diabetes Mellitus Care Plan   STAT CBG when hypoglycemia is suspected. If treated, recheck every 15 minutes after each treatment until CBG >/= 70 mg/dl   Refer to Hypoglycemia Protocol Sidebar Report for treatment of CBG < 70 mg/dl   No HS correction Insulin    Maintain IV access   Vital signs   Notify physician (specify)   Mobility Protocol: No Restrictions   Refer to Sidebar Report Mobility Protocol for Adult Inpatient   Initiate Adult Central Line Maintenance and Catheter Clearance Protocol for patients with central line (CVC, PICC, Port, Hemodialysis, Trialysis)   Daily weights   Intake and Output   Initiate CHG Protocol for patients in ICU/SD or any  patient with a  central line or foley catheter   Do not place and if present remove PureWick   Initiate Oral Care Protocol   Initiate Carrier Fluid Protocol   RN may order General Admission PRN Orders utilizing General Admission PRN medications (through manage orders) for the following patient needs: allergy symptoms (Claritin ), cold sores (Carmex), cough (Robitussin DM), eye irritation (Liquifilm Tears), hemorrhoids (Tucks), indigestion (Maalox), minor skin irritation (Hydrocortisone Cream), muscle pain Lucienne Gay), nose irritation (saline nasal spray) and sore throat (Chloraseptic spray).   Cardiac Monitoring - Continuous Indefinite   Strict intake and output   Apply Diabetes Mellitus Care Plan   Full code   Consult to general surgery   Consult to hospitalist   Patient needs PT consult as soon as possible.  Discharge is pending PT consult.  Please call  PT eval and treat   Pulse oximetry check with vital signs   Oxygen therapy Mode or (Route): Nasal cannula; Liters Per Minute: 2; Keep O2 saturation between: greater than 92 %   CBG monitoring, ED   I-Stat CG4 Lactic Acid   Admit to Inpatient (patient's expected length of stay will be greater than 2 midnights or inpatient only procedure)   Aspiration precautions   Fall precautions   VAS US  LOWER EXTREMITY VENOUS (DVT)    Author: Mario LULLA Blanch, MD 12 pm- 8 pm. [facility]. 11/02/2024 6:54 PM Please note for any communication after hours contact [facility] Assigned provider on call on Amion.       [1]  Allergies Allergen Reactions   Penicillins Anaphylaxis   Pneumococcal Vaccines Anaphylaxis   Shellfish Allergy Anaphylaxis   Barbiturates    Gabapentin      Urinary retention   Influenza Vaccines    Lisinopril  Cough   Other Nausea And Vomiting    Pt wife states that dye that is used for eye exam in dr    Sildenafil Other (See Comments) and Nausea And Vomiting    Other reaction(s): Headache, Dizziness   Topiramate Other (See Comments)     Chest spasms and numbness   Zofran  [Ondansetron ]    Amlodipine  Other (See Comments)    LE edema   Metformin  Nausea And Vomiting   Tadalafil Other (See Comments)    dizziness   Vardenafil Other (See Comments)    headaches

## 2024-11-02 NOTE — ED Notes (Signed)
 Pt back from ct and xray

## 2024-11-03 ENCOUNTER — Inpatient Hospital Stay (HOSPITAL_COMMUNITY)

## 2024-11-03 LAB — CBC
HCT: 29.3 % — ABNORMAL LOW (ref 39.0–52.0)
Hemoglobin: 9.9 g/dL — ABNORMAL LOW (ref 13.0–17.0)
MCH: 31.7 pg (ref 26.0–34.0)
MCHC: 33.8 g/dL (ref 30.0–36.0)
MCV: 93.9 fL (ref 80.0–100.0)
Platelets: 162 K/uL (ref 150–400)
RBC: 3.12 MIL/uL — ABNORMAL LOW (ref 4.22–5.81)
RDW: 12.8 % (ref 11.5–15.5)
WBC: 7.1 K/uL (ref 4.0–10.5)
nRBC: 0 % (ref 0.0–0.2)

## 2024-11-03 LAB — GLUCOSE, CAPILLARY
Glucose-Capillary: 144 mg/dL — ABNORMAL HIGH (ref 70–99)
Glucose-Capillary: 128 mg/dL — ABNORMAL HIGH (ref 70–99)
Glucose-Capillary: 134 mg/dL — ABNORMAL HIGH (ref 70–99)
Glucose-Capillary: 145 mg/dL — ABNORMAL HIGH (ref 70–99)
Glucose-Capillary: 179 mg/dL — ABNORMAL HIGH (ref 70–99)
Glucose-Capillary: 124 mg/dL — ABNORMAL HIGH (ref 70–99)
Glucose-Capillary: 155 mg/dL — ABNORMAL HIGH (ref 70–99)

## 2024-11-03 LAB — COMPREHENSIVE METABOLIC PANEL WITH GFR
ALT: 20 U/L (ref 0–44)
AST: 21 U/L (ref 15–41)
Albumin: 3.1 g/dL — ABNORMAL LOW (ref 3.5–5.0)
Alkaline Phosphatase: 63 U/L (ref 38–126)
Anion gap: 10 (ref 5–15)
BUN: 32 mg/dL — ABNORMAL HIGH (ref 8–23)
CO2: 23 mmol/L (ref 22–32)
Calcium: 8.9 mg/dL (ref 8.9–10.3)
Chloride: 108 mmol/L (ref 98–111)
Creatinine, Ser: 2.02 mg/dL — ABNORMAL HIGH (ref 0.61–1.24)
GFR, Estimated: 34 mL/min — ABNORMAL LOW (ref >60)
Glucose, Bld: 216 mg/dL — ABNORMAL HIGH (ref 70–99)
Potassium: 3.8 mmol/L (ref 3.5–5.1)
Sodium: 141 mmol/L (ref 135–145)
Total Bilirubin: 0.7 mg/dL (ref 0.0–1.2)
Total Protein: 6.4 g/dL — ABNORMAL LOW (ref 6.5–8.1)

## 2024-11-03 LAB — HEMOGLOBIN A1C
Hgb A1c MFr Bld: 7.6 % — ABNORMAL HIGH (ref 4.8–5.6)
Mean Plasma Glucose: 171.42 mg/dL

## 2024-11-03 LAB — MAGNESIUM: Magnesium: 1.7 mg/dL (ref 1.7–2.4)

## 2024-11-03 LAB — D-DIMER, QUANTITATIVE: D-Dimer, Quant: 1.55 ug{FEU}/mL — ABNORMAL HIGH (ref 0.00–0.50)

## 2024-11-03 MED ORDER — CYCLOBENZAPRINE HCL 5 MG PO TABS: 5.0000 mg | ORAL_TABLET | Freq: Three times a day (TID) | ORAL | Status: DC | PRN

## 2024-11-03 MED ORDER — VANCOMYCIN HCL 1000 MG IV SOLR
INTRAVENOUS | Status: AC
  Filled 2024-11-03: qty 20

## 2024-11-03 NOTE — Evaluation (Signed)
 Physical Therapy Evaluation Patient Details Name: [name] MRN: [id number] DOB: Oct 08, 1951 Today's Date: 11/03/2024  History of Present Illness  73 y.o. male presents to [facility] hospital on 11/02/2024 after a fall at home. CT shows L inguinal hernia, imaging is otherwise unremarkable. PMH includes CAD, CKD, GERD, HLD, HTN, LBBB, peripheral neuropathy, TIA.  Clinical Impression  Pt admitted with above diagnosis. 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 Typically ambulates with SPC. Required up to min assist to mobilize safely today using a RW to maximize support. Tolerating a little more WB through LLE. Quad very TTP. Tolerated gentle isometric quad sets, AROM knee flexion seated for quad stretch, and self massage to reduce pain. He also complained of some Lt buttock pain so questions possibility of Lumbar radiculopathy vs spasticity in LLE. He states this pain and buckling caused fall PTA. 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 Will follow and progress. Pt currently with functional limitations due to the deficits listed below (see PT Problem List). Pt will benefit from acute skilled PT to increase their independence and safety with mobility to allow discharge.           If plan is discharge home, recommend the following: A little help with walking and/or transfers;A little help with bathing/dressing/bathroom;Assistance with cooking/housework;Assist for transportation;Help with stairs or ramp for entrance   Can travel by private vehicle        Equipment Recommendations Rolling walker (2 wheels)  Recommendations for Other Services       Functional Status Assessment Patient has had a recent decline in their functional status and demonstrates the ability to make significant improvements in function in a reasonable and  predictable amount of time.     Precautions / Restrictions Precautions Precautions: Fall Recall of Precautions/Restrictions: Intact Restrictions Weight Bearing Restrictions Per Provider Order: No      Mobility  Bed Mobility Overal bed mobility: Needs Assistance Bed Mobility: Supine to Sit, Sit to Supine     Supine to sit: Contact guard, HOB elevated, Used rails Sit to supine: Min assist   General bed mobility comments: CGA with use of rail to rise to EOB. Increased LLE spasms. Min assist for LE supprot back into bed. Able to use RLE to support LLE as needed.    Transfers Overall transfer level: Needs assistance Equipment used: Rolling walker (2 wheels) Transfers: Sit to/from Stand Sit to Stand: Contact guard assist           General transfer comment: CGA for safety to rise, cues for hand placement and technique, minimal WB tolerate through LLE. Required 2 attempts. Poor control descending dueto LLE pain.    Ambulation/Gait Ambulation/Gait assistance: Contact guard assist Gait Distance (Feet): 30 Feet Assistive device: Rolling walker (2 wheels) Gait Pattern/deviations: Step-to pattern, Decreased stance time - left, Decreased dorsiflexion - left, Decreased weight shift to left, Antalgic, Trunk flexed Gait velocity: dec Gait velocity interpretation: <1.31 ft/sec, indicative of household ambulator   General Gait Details: Educated on safe AD use with RW for support. Cues for proximity to device to maximize support and unload LLE as needed. No overt buckling with adequate support from UEs. Pt maintains LUE grip. Step to pattern with cues to lead with LLE. States he is tolerating a little more WB through LLE today but still painful in Lt quad.  Stairs  Wheelchair Mobility     Tilt Bed    Modified Rankin (Stroke Patients Only)       Balance Overall balance assessment: Needs assistance Sitting-balance support: No upper extremity supported, Feet  supported Sitting balance-Leahy Scale: Good     Standing balance support: Bilateral upper extremity supported, Reliant on assistive device for balance Standing balance-Leahy Scale: Poor Standing balance comment: Reliant on device for support.                             Pertinent Vitals/Pain Pain Assessment Pain Assessment: Faces Faces Pain Scale: Hurts even more Pain Location: Lt mid and distal thigh anterior and laterally - TTP quadriceps. Pain Descriptors / Indicators: Spasm, Grimacing, Cramping Pain Intervention(s): Limited activity within patient's tolerance, Monitored during session, Repositioned, Utilized relaxation techniques    Home Living Family/patient expects to be discharged to:: Private residence Living Arrangements: Children;Other relatives Available Help at Discharge: Family Type of Home: House Home Access: Stairs to enter Entrance Stairs-Rails: None Entrance Stairs-Number of Steps: 1   Home Layout: Two level;Full bath on main level;Able to live on main level with bedroom/bathroom Home Equipment: Wheelchair - manual;Cane - single point;Shower seat;Shower seat - built in;Tub bench;Hand held shower head      Prior Function Prior Level of Function : Independent/Modified Independent;History of Falls (last six months)             Mobility Comments: Ambulates with SPC, fall resulting in admission - states due to Lt thigh pain and buckling. ADLs Comments: Reports independent with adaptive equipment.     Extremity/Trunk Assessment   Upper Extremity Assessment Upper Extremity Assessment: Defer to OT evaluation    Lower Extremity Assessment Lower Extremity Assessment: LLE deficits/detail LLE Deficits / Details: Atrophy, hx of prior CVA. TTP Lt distal quad anterior and lateral muscle bellies.       Communication   Communication Communication: No apparent difficulties    Cognition Arousal: Alert Behavior During Therapy: WFL for tasks  assessed/performed   PT - Cognitive impairments: No apparent impairments                         Following commands: Intact       Cueing Cueing Techniques: Verbal cues     General Comments General comments (skin integrity, edema, etc.): Educated on safety with mobility, call for nursing to assist at this time and use RW for OOB mobility.    Exercises General Exercises - Lower Extremity Ankle Circles/Pumps: AROM, Both, 10 reps, Supine Quad Sets: Strengthening, Both, 5 reps, Supine Other Exercises Other Exercises: Gentle massage to sensitive muscle, seated quad stretch 3 x30 sec.   Assessment/Plan    PT Assessment Patient needs continued PT services  PT Problem List Decreased strength;Decreased range of motion;Decreased activity tolerance;Decreased balance;Decreased mobility;Decreased knowledge of use of DME;Impaired tone;Pain       PT Treatment Interventions DME instruction;Gait training;Stair training;Functional mobility training;Therapeutic activities;Therapeutic exercise;Balance training;Neuromuscular re-education;Patient/family education;Modalities    PT Goals (Current goals can be found in the Care Plan section)  Acute Rehab PT Goals Patient Stated Goal: Reduce pain, return home PT Goal Formulation: With patient Time For Goal Achievement: 11/17/24 Potential to Achieve Goals: Good    Frequency Min 2X/week     Co-evaluation               AM-PAC PT 6 Clicks Mobility  Outcome Measure Help needed turning from your back to your  side while in a flat bed without using bedrails?: A Little Help needed moving from lying on your back to sitting on the side of a flat bed without using bedrails?: A Little Help needed moving to and from a bed to a chair (including a wheelchair)?: A Little Help needed standing up from a chair using your arms (e.g., wheelchair or bedside chair)?: A Little Help needed to walk in hospital room?: A Little Help needed climbing 3-5  steps with a railing? : A Lot 6 Click Score: 17    End of Session Equipment Utilized During Treatment: Gait belt Activity Tolerance: Patient tolerated treatment well Patient left: in bed;with call bell/phone within reach;with bed alarm set;with family/visitor present Nurse Communication: Mobility status PT Visit Diagnosis: Unsteadiness on feet (R26.81);Other abnormalities of gait and mobility (R26.89);History of falling (Z91.81);Difficulty in walking, not elsewhere classified (R26.2);Other symptoms and signs involving the nervous system (R29.898);Hemiplegia and hemiparesis Hemiplegia - dominant/non-dominant:  (Prior CVA resulting in Lt hemiplegia.)    Time: 1333-1401 PT Time Calculation (min) (ACUTE ONLY): 28 min   Charges:   PT Evaluation $PT Eval Low Complexity: 1 Low PT Treatments $Therapeutic Activity: 8-22 mins PT General Charges $$ ACUTE PT VISIT: 1 Visit         Leontine Roads, PT, DPT [facility]  Re[facility] Services Physical Therapist Office: [phone] Website: [email or web address]   Leontine GORMAN Roads 11/03/2024, 3:06 PM

## 2024-11-03 NOTE — Progress Notes (Signed)
 LLE venous duplex has been completed.   Results can be found under chart review under CV PROC. 11/03/2024 1:16 PM Aireonna Bauer RVT, RDMS

## 2024-11-03 NOTE — Progress Notes (Signed)
 Progress Note     Subjective: Patient tolerating CLD without n/v. Having continued pain of left groin region but denies worsening pain. Last BM was yesterday morning. Denies flatulence.   ROS  All negative with the exception of above.  Objective: Vital signs in last 24 hours: Temp:  [97.9 F (36.6 C)-98.6 F (37 C)] 98.4 F (36.9 C) (12/13 0445) Pulse Rate:  [62-71] 62 (12/13 0445) Resp:  [14-27] 16 (12/13 0445) BP: (114-146)/(55-94) 145/70 (12/13 0445) SpO2:  [99 %-100 %] 99 % (12/13 0445) Weight:  [97.4 kg-100.8 kg] 97.4 kg (12/13 0500) Last BM Date : 11/02/24  Intake/Output from previous day: 12/12 0701 - 12/13 0700 In: -  Out: 400 [Urine:400] Intake/Output this shift: No intake/output data recorded.  PE: General: Pleasant male laying on hospital bed in NAD. HEENT: head -normocephalic, atraumatic. CV- Normal HR during encounter. Pulm- Breathing is non-labored ORA Abd- soft, NT/ND, appropriate bowel sounds. There is a visible and palpable bulge in the left groin consistent with the hernia on CT. No cellulitis or overlying skin changes. Area is tender. I am unable to reduce the hernia. Overall abdominal exam similar to prior.  MSK- UE/LE symmetrical, no cyanosis, clubbing, or edema.  Psych- Alert and Oriented x3 with appropriate affect   Lab Results:  Recent Labs    11/02/24 0736 11/02/24 2342  WBC 7.4 7.1  HGB 10.3* 9.9*  HCT 31.5* 29.3*  PLT 153 162   BMET Recent Labs    11/02/24 0736 11/02/24 2342  NA 138 141  K 4.4 3.8  CL 107 108  CO2 27 23  GLUCOSE 153* 216*  BUN 34* 32*  CREATININE 2.02* 2.02*  CALCIUM  8.5* 8.9   PT/INR No results for input(s): LABPROT, INR in the last 72 hours. CMP     Component Value Date/Time   NA 141 11/02/2024 2342   NA 138 01/25/2023 1120   K 3.8 11/02/2024 2342   CL 108 11/02/2024 2342   CO2 23 11/02/2024 2342   GLUCOSE 216 (H) 11/02/2024 2342   BUN 32 (H) 11/02/2024 2342   BUN 32 (H) 01/25/2023 1120    CREATININE 2.02 (H) 11/02/2024 2342   CALCIUM  8.9 11/02/2024 2342   PROT 6.4 (L) 11/02/2024 2342   PROT 7.2 12/24/2022 0812   ALBUMIN 3.1 (L) 11/02/2024 2342   ALBUMIN 4.1 12/24/2022 0812   AST 21 11/02/2024 2342   ALT 20 11/02/2024 2342   ALKPHOS 63 11/02/2024 2342   BILITOT 0.7 11/02/2024 2342   BILITOT 0.8 12/24/2022 0812   GFRNONAA 34 (L) 11/02/2024 2342   GFRAA 32 (L) 09/10/2020 1437   Lipase     Component Value Date/Time   LIPASE 57 (H) 07/15/2023 0553       Studies/Results: MR BRAIN WO CONTRAST Result Date: 11/02/2024 EXAM: MRI BRAIN WITHOUT CONTRAST 11/02/2024 01:52:46 PM TECHNIQUE: Multiplanar multisequence MRI of the head/brain was performed without the administration of intravenous contrast. COMPARISON: Head CT 11/02/2024 and MRI 10/11/2012. CLINICAL HISTORY: Neuro deficit, acute, stroke suspected; Patient has left-sided deficits and CT shows possible age-indeterminate stroke. He did fall when his left leg gave out on him. Rule out new stroke. FINDINGS: BRAIN AND VENTRICLES: No acute infarct, mass, midline shift, hydrocephalus, or extra-axial fluid collection is identified. There is a chronic infarct involving the right thalamus and posterior limb of the right internal capsule with associated chronic blood products, and this infarct was acute in 2013. A chronic microhemorrhage in the right occipital lobe is unchanged. Patchy T2 hyperintensities  in the cerebral white matter and pons have mildly progressed from 2013 and are nonspecific but compatible with moderate chronic small vessel ischemic disease. There are multiple chronic lacunar infarcts in the basal ganglia and cerebellum bilaterally which are largely new from 2013. There is mild generalized cerebral atrophy. Major intracranial vascular flow voids are preserved. ORBITS: Bilateral cataract extraction. SINUSES AND MASTOIDS: Chronic right maxillary sinusitis with sinus expansion and bowing into the nasal cavity as described  on CT. Extensive right anterior and mid ethmoid air cell opacification as well. No significant mastoid fluid. BONES AND SOFT TISSUES: Normal marrow signal. No acute soft tissue abnormality. IMPRESSION: 1. No acute intracranial abnormality. 2. Chronic right thalamic infarct. 3. Moderate chronic small vessel ischemic disease. 4. Small chronic cerebellar infarcts. Electronically signed by: Dasie Hamburg MD 11/02/2024 02:11 PM EST RP Workstation: [id number]   US  SCROTUM W/DOPPLER Result Date: 11/02/2024 CLINICAL DATA:  Left groin pain. EXAM: SCROTAL ULTRASOUND DOPPLER ULTRASOUND OF THE TESTICLES TECHNIQUE: Complete ultrasound examination of the testicles, epididymis, and other scrotal structures was performed. Color and spectral Doppler ultrasound were also utilized to evaluate blood flow to the testicles. COMPARISON:  None Available. FINDINGS: Right testicle Measurements: 3.0 x 1.7 x 2.4 cm. No mass or microlithiasis visualized. Doppler: There is normal vascularity on color doppler examination. Spectral doppler arterial and venous waveforms are normal. Left testicle Measurements:  3.0 x 1.9 x 2.6 cm. No mass or microlithiasis visualized. Doppler: There is normal vascularity on color doppler examination. Spectral doppler arterial and venous waveforms are normal. Right epididymis:  Normal in size and appearance. Left epididymis: Normal in size and appearance. 3 mm epididymal head cyst. Hydrocele:  None visualized. Varicocele:  None visualized. IMPRESSION: No acute abnormality identified. Electronically Signed   By: Harrietta Sherry M.D.   On: 11/02/2024 13:02   CT ABDOMEN PELVIS W CONTRAST Result Date: 11/02/2024 EXAM: CT ABDOMEN AND PELVIS WITH CONTRAST 11/02/2024 11:09:09 AM TECHNIQUE: CT of the abdomen and pelvis was performed with the administration of 60 mL of iohexol  (OMNIPAQUE ) 350 MG/ML injection. Multiplanar reformatted images are provided for review. Automated exposure control, iterative reconstruction,  and/or weight-based adjustment of the mA/kV was utilized to reduce the radiation dose to as low as reasonably achievable. COMPARISON: Comparison is made to 07/14/2023. CLINICAL HISTORY: Left inguinal tenderness and possible hernia. FINDINGS: LOWER CHEST: No acute abnormality. LIVER: The liver is unremarkable. GALLBLADDER AND BILE DUCTS: Cholelithiasis without superimposed pericholecystic inflammatory change. No intra or extrahepatic biliary ductal dilation. SPLEEN: No acute abnormality. PANCREAS: Lobulated cystic lesion is again seen within the uncinate process of the pancreas measuring 2.0 x 2.4 cm, mildly enlarged since prior examination. Several small septa are noted and the lesion may represent a serous cystadenoma, but is not optimally characterized on this examination. If indicated, this will be better assessed with dedicated contrast enhanced MRI examination. ADRENAL GLANDS: No acute abnormality. KIDNEYS, URETERS AND BLADDER: No stones in the kidneys or ureters. No hydronephrosis. No perinephric or periureteral stranding. Urinary bladder is unremarkable. GI AND BOWEL: Small left inguinal hernia contains a single loop of unremarkable mid small bowel. Small fat-containing right inguinal hernia. The stomach, small bowel, and large bowel are otherwise unremarkable. Appendix normal. There is no bowel obstruction. PERITONEUM AND RETROPERITONEUM: No ascites. No free air. VASCULATURE: Aorta is normal in caliber. Mild aortoiliac atherosclerotic calcification. No aortic aneurysm. LYMPH NODES: No lymphadenopathy. REPRODUCTIVE ORGANS: No acute abnormality. BONES AND SOFT TISSUES: No acute osseous abnormality. No focal soft tissue abnormality. IMPRESSION: 1. Small left inguinal  hernia containing a single loop of unremarkable mid small bowel. No superimposed inflammatory changes. 2. Small fat-containing right inguinal hernia. 3. Lobulated cystic lesion within the uncinate process of the pancreas, mildly enlarged since  prior examination, indeterminate; recommend pancreas-protocol MRI for further characterization, if clinically indicated . Electronically signed by: Dorethia Molt MD 11/02/2024 11:55 AM EST RP Workstation: [id number]   DG Chest 2 View Result Date: 11/02/2024 CLINICAL DATA:  Fall EXAM: CHEST - 2 VIEW COMPARISON:  March 09, 2024 FINDINGS: The heart size and mediastinal contours are within normal limits. Both lungs are clear. The visualized skeletal structures are unremarkable. IMPRESSION: No active cardiopulmonary disease. Electronically Signed   By: Lynwood Landy Raddle M.D.   On: 11/02/2024 08:56   DG Knee Complete 4 Views Left Result Date: 11/02/2024 CLINICAL DATA:  Fall EXAM: LEFT KNEE - COMPLETE 4+ VIEW COMPARISON:  None Available. FINDINGS: No evidence of fracture, dislocation, or joint effusion. Status post ACL reconstruction. Mild patellar spurring. Moderate narrowing of medial joint space. Soft tissues are unremarkable. IMPRESSION: No acute abnormality seen. Electronically Signed   By: Lynwood Landy Raddle M.D.   On: 11/02/2024 08:55   DG Femur Min 2 Views Left Result Date: 11/02/2024 CLINICAL DATA:  Fall EXAM: LEFT FEMUR 2 VIEWS COMPARISON:  None Available. FINDINGS: There is no evidence of fracture or other focal bone lesions. Soft tissues are unremarkable. Status post ACL reconstruction. IMPRESSION: No acute abnormality seen. Electronically Signed   By: Lynwood Landy Raddle M.D.   On: 11/02/2024 08:54   DG Pelvis 1-2 Views Result Date: 11/02/2024 CLINICAL DATA:  Fall EXAM: PELVIS - 1-2 VIEW COMPARISON:  None Available. FINDINGS: There is no evidence of pelvic fracture or diastasis. No pelvic bone lesions are seen. IMPRESSION: Negative. Electronically Signed   By: Lynwood Landy Raddle M.D.   On: 11/02/2024 08:52   CT Cervical Spine Wo Contrast Result Date: 11/02/2024 EXAM: CT CERVICAL SPINE WITHOUT CONTRAST 11/02/2024 08:03:00 AM TECHNIQUE: CT of the cervical spine was performed without the administration of  intravenous contrast. Multiplanar reformatted images are provided for review. Automated exposure control, iterative reconstruction, and/or weight based adjustment of the mA/kV was utilized to reduce the radiation dose to as low as reasonably achievable. COMPARISON: None available. CLINICAL HISTORY: Polytrauma, blunt Polytrauma, blunt FINDINGS: CERVICAL SPINE: BONES AND ALIGNMENT: No acute fracture or traumatic malalignment. DEGENERATIVE CHANGES: No significant degenerative changes. SOFT TISSUES: No prevertebral soft tissue swelling. IMPRESSION: 1. No acute abnormality of the cervical spine. Electronically signed by: Evalene Coho MD 11/02/2024 08:09 AM EST RP Workstation: [id number]   CT Head Wo Contrast Result Date: 11/02/2024 EXAM: CT HEAD WITHOUT 11/02/2024 08:03:00 AM TECHNIQUE: CT of the head was performed without the administration of intravenous contrast. Automated exposure control, iterative reconstruction, and/or weight based adjustment of the mA/kV was utilized to reduce the radiation dose to as low as reasonably achievable. COMPARISON: CT of the head dated 10/10/2012. CLINICAL HISTORY: Head trauma, minor (Age >= 65y). FINDINGS: BRAIN AND VENTRICLES: No acute intracranial hemorrhage. No mass effect or midline shift. No extra-axial fluid collection. No evidence of acute infarct. No hydrocephalus. Age indeterminate infarct in the right thalamus. There is age-related atrophy and mild-to-moderate cerebral white matter disease. There is no evidence of acute intracranial injury. ORBITS: No acute abnormality. Bilateral lens replacement noted. SINUSES AND MASTOIDS: Right maxillary sinus: Complete opacification with hyperdensity, radiodense debris, and calcification, suggesting chronic sinusitis. Dehiscence and bowing of the medial wall. Left maxillary sinus: Polypoid mucosal thickening within the floor. Right ethmoid  sinus/air cells: Mucosal thickening and moderate opacification. SOFT TISSUES AND SKULL: No  acute skull fracture. No acute soft tissue abnormality. IMPRESSION: 1. No acute intracranial abnormality. 2. Age indeterminate infarct in the right thalamus. 3. 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 Polypoid mucosal thickening in the left maxillary sinus. Electronically signed by: Evalene Coho MD 11/02/2024 08:08 AM EST RP Workstation: [id number]    Anti-infectives: Anti-infectives (From admission, onward)    None        Assessment/Plan Left inguinal hernia containing small bowel originally presented for fall Fat containing right inguinal hernia  -Afebrile. -WBC yesterday 7.1 and HGB 9.9 from 10.3. -Exam consistent with prior. No worsening symptoms or new concerns. -Tolerating CLD without n/v. BM yesterday morning. -No emergent nature for hernia repair but he will need this fixed during his hospital stay. -No acute surgical needs for right inguinal hernia -Will continue to follow.  FEN - CLD, advance as tolerated to fulls. Would avoid solid diet in this patient with a bowel containing hernia VTE - SCD's, Heparin  injection (Plavix  held) ID - No abx indicated from a surgical standpoint   Insulin  dependent diabetes with complications  CAD PMH CVA CKD Diabetic retinopathy   LOS: 1 day   I reviewed hospitalist notes, specialist notes, nursing notes, last 24 h vitals and pain scores, last 48 h intake and output, last 24 h labs and trends, and last 24 h imaging results.  This care required moderate level of medical decision making.    Marjorie Carlyon Favre, [facility] Surgery 11/03/2024, 8:19 AM Please see Amion for pager number during day hours 7:00am-4:30pm

## 2024-11-03 NOTE — Plan of Care (Signed)
   Problem: Education: Goal: Ability to describe self-care measures that may prevent or decrease complications (Diabetes Survival Skills Education) will improve Outcome: Progressing

## 2024-11-03 NOTE — Progress Notes (Signed)
 [facility]                                                                               Thiago Ragsdale, is a 73 y.o. male, DOB - 1951-03-03, FMW:[id number] Admit date - 11/02/2024    Outpatient Primary MD for the patient is Geofm, Glade PARAS, MD  LOS - 1  days    Brief summary   [name] is a 73 y.o. 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   He was also found to have left inguinal hernia with bowel which couldn't be reduced. General surgery consulted.   Assessment & Plan     Left leg pain X rays are unremarkable.  Venous duplex is negative for DVT.  Pain control.prn muscle relaxants.    Left inguinal hernia Gen surgery on board. Plan for hernia repair when the schedule allows.  Pain control.  Liquid diet.    Type 2 DM CBG (last 3)  Recent Labs    11/03/24 0756 11/03/24 1150 11/03/24 1636  GLUCAP 145* 179* 124*   Resume SSI.  A1c is pending.    Anemia of chronic disease Hemoglobin stable around 9.    Stage 3b CKD Creatinine at baseline.    Hypertension Well controlled.  Resume coreg .    Hyperlipidemia Resume statin.      Lobulated cystic lesion within the uncinate process of the pancreas, mildly enlarged since prior examination, indeterminate; recommend pancreas-protocol MRI for further characterization as outpatient.  Discussed with patient.   Estimated body mass index is 29.95 kg/m as calculated from the following:   Height as of this encounter: 5' 11 (1.803 m).   Weight as of this encounter: 97.4 kg.  Code Status: full code.  DVT Prophylaxis:  heparin  injection 5,000 Units Start: 11/02/24 2200   Level of Care: Level of care: Telemetry Family Communication: none at bedside.   Disposition Plan:     Remains inpatient appropriate:   further eval for hernia repair.    Procedures:  Hernia repair next week.   Consultants:   General surgery  Antimicrobials:   Anti-infectives (From admission, onward)    None        Medications  Scheduled Meds:  atorvastatin   40 mg Oral QHS   carvedilol   25 mg Oral BID   feeding supplement  1 Container Oral TID BM   heparin   5,000 Units Subcutaneous Q12H   insulin  aspart  0-9 Units Subcutaneous Q4H   sodium chloride  flush  3 mL Intravenous Q12H   Continuous Infusions: PRN Meds:.acetaminophen  **OR** acetaminophen , cyclobenzaprine , fentaNYL  (SUBLIMAZE ) injection, fluticasone , HYDROcodone -acetaminophen     Subjective:   [name] was seen and examined today.  Leg pain improving. Wants diet to be advanced. Discussed the results of the general surgery.   Objective:   Vitals:   11/03/24 0445 11/03/24 0500 11/03/24 0900 11/03/24 1802  BP: (!) 145/70  (!) 149/71 (!) 151/77  Pulse: 62  66 (!) 59  Resp: 16  16 16   Temp: 98.4  F (36.9 C)  98 F (36.7 C) 98.4 F (36.9 C)  TempSrc: Oral  Oral Oral  SpO2: 99%  100% 100%  Weight:  97.4 kg    Height:        Intake/Output Summary (Last 24 hours) at 11/03/2024 1858 Last data filed at 11/03/2024 9666 Gross per 24 hour  Intake --  Output 400 ml  Net -400 ml   Filed Weights   11/02/24 2226 11/03/24 0500  Weight: 100.8 kg 97.4 kg     Exam General exam: Appears calm and comfortable  Respiratory system: Clear to auscultation. Respiratory effort normal. Cardiovascular system: S1 & S2 heard, RRR.  Gastrointestinal system: Abdomen is soft bs+ Central nervous system: Alert and oriented. Extremities: left leg pain and tenderness  Skin: No rashes, lesions or ulcers Psychiatry:  Mood & affect appropriate.     Data Reviewed:  I have personally reviewed following labs and imaging studies   CBC Lab Results  Component Value Date   WBC 7.1 11/02/2024   RBC 3.12 (L) 11/02/2024   HGB 9.9 (L) 11/02/2024   HCT  29.3 (L) 11/02/2024   MCV 93.9 11/02/2024   MCH 31.7 11/02/2024   PLT 162 11/02/2024   MCHC 33.8 11/02/2024   RDW 12.8 11/02/2024   LYMPHSABS 2.2 11/02/2024   MONOABS 0.9 11/02/2024   EOSABS 0.3 11/02/2024   BASOSABS 0.0 11/02/2024     Last metabolic panel Lab Results  Component Value Date   NA 141 11/02/2024   K 3.8 11/02/2024   CL 108 11/02/2024   CO2 23 11/02/2024   BUN 32 (H) 11/02/2024   CREATININE 2.02 (H) 11/02/2024   GLUCOSE 216 (H) 11/02/2024   GFRNONAA 34 (L) 11/02/2024   GFRAA 32 (L) 09/10/2020   CALCIUM  8.9 11/02/2024   PHOS 3.6 08/16/2012   PROT 6.4 (L) 11/02/2024   ALBUMIN 3.1 (L) 11/02/2024   LABGLOB 3.1 12/24/2022   AGRATIO 1.3 12/24/2022   BILITOT 0.7 11/02/2024   ALKPHOS 63 11/02/2024   AST 21 11/02/2024   ALT 20 11/02/2024   ANIONGAP 10 11/02/2024    CBG (last 3)  Recent Labs    11/03/24 0756 11/03/24 1150 11/03/24 1636  GLUCAP 145* 179* 124*      Coagulation Profile: No results for input(s): INR, PROTIME in the last 168 hours.   Radiology Studies: VAS US  LOWER EXTREMITY VENOUS (DVT) Result Date: 11/03/2024  Lower Venous DVT Study Patient Name:  [name]  Date of Exam:   11/03/2024 Medical Rec #: [id number]           Accession #:    [id number] Date of Birth: [birth year]          Patient Gender: M Patient Age:   51 years Exam Location:  [facility] Procedure:      VAS US  LOWER EXTREMITY VENOUS (DVT) Referring Phys: EKTA PATEL --------------------------------------------------------------------------------  Indications: Left lateral thigh pain.  Comparison Study: No previous exams Performing Technologist: Jody Hill RVT, RDMS  Examination Guidelines: A complete evaluation includes B-mode imaging, spectral Doppler, color Doppler, and power Doppler as needed of all accessible portions of each vessel. Bilateral testing is considered an integral part of a complete examination. Limited examinations for reoccurring indications may  be performed as noted. The reflux portion of the exam is performed with the patient in reverse Trendelenburg.  +-----+---------------+---------+-----------+----------+--------------+ RIGHTCompressibilityPhasicitySpontaneityPropertiesThrombus Aging +-----+---------------+---------+-----------+----------+--------------+ CFV  Full           Yes      Yes                                 +-----+---------------+---------+-----------+----------+--------------+   +---------+---------------+---------+-----------+----------+--------------+  LEFT     CompressibilityPhasicitySpontaneityPropertiesThrombus Aging +---------+---------------+---------+-----------+----------+--------------+ CFV      Full           Yes      Yes                                 +---------+---------------+---------+-----------+----------+--------------+ SFJ      Full                                                        +---------+---------------+---------+-----------+----------+--------------+ FV Prox  Full           Yes      Yes                                 +---------+---------------+---------+-----------+----------+--------------+ FV Mid   Full           Yes      Yes                                 +---------+---------------+---------+-----------+----------+--------------+ FV DistalFull           Yes      Yes                                 +---------+---------------+---------+-----------+----------+--------------+ PFV      Full                                                        +---------+---------------+---------+-----------+----------+--------------+ POP      Full           Yes      Yes                                 +---------+---------------+---------+-----------+----------+--------------+ PTV      Full                                                        +---------+---------------+---------+-----------+----------+--------------+ PERO     Full                                                         +---------+---------------+---------+-----------+----------+--------------+     Summary: RIGHT: - No evidence of common femoral vein obstruction.  - There is no evidence of deep vein thrombosis in the lower extremity.  LEFT: - There is no evidence of deep vein thrombosis in the lower extremity.  *See table(s) above for measurements and observations. Electronically signed by Gaile New MD on 11/03/2024 at 6:07:52 PM.  Final    MR BRAIN WO CONTRAST Result Date: 11/02/2024 EXAM: MRI BRAIN WITHOUT CONTRAST 11/02/2024 01:52:46 PM TECHNIQUE: Multiplanar multisequence MRI of the head/brain was performed without the administration of intravenous contrast. COMPARISON: Head CT 11/02/2024 and MRI 10/11/2012. CLINICAL HISTORY: Neuro deficit, acute, stroke suspected; Patient has left-sided deficits and CT shows possible age-indeterminate stroke. He did fall when his left leg gave out on him. Rule out new stroke. FINDINGS: BRAIN AND VENTRICLES: No acute infarct, mass, midline shift, hydrocephalus, or extra-axial fluid collection is identified. There is a chronic infarct involving the right thalamus and posterior limb of the right internal capsule with associated chronic blood products, and this infarct was acute in 2013. A chronic microhemorrhage in the right occipital lobe is unchanged. Patchy T2 hyperintensities in the cerebral white matter and pons have mildly progressed from 2013 and are nonspecific but compatible with moderate chronic small vessel ischemic disease. There are multiple chronic lacunar infarcts in the basal ganglia and cerebellum bilaterally which are largely new from 2013. There is mild generalized cerebral atrophy. Major intracranial vascular flow voids are preserved. ORBITS: Bilateral cataract extraction. SINUSES AND MASTOIDS: Chronic right maxillary sinusitis with sinus expansion and bowing into the nasal cavity as described on CT. Extensive right  anterior and mid ethmoid air cell opacification as well. No significant mastoid fluid. BONES AND SOFT TISSUES: Normal marrow signal. No acute soft tissue abnormality. IMPRESSION: 1. No acute intracranial abnormality. 2. Chronic right thalamic infarct. 3. Moderate chronic small vessel ischemic disease. 4. Small chronic cerebellar infarcts. Electronically signed by: Dasie Hamburg MD 11/02/2024 02:11 PM EST RP Workstation: [id number]   US  SCROTUM W/DOPPLER Result Date: 11/02/2024 CLINICAL DATA:  Left groin pain. EXAM: SCROTAL ULTRASOUND DOPPLER ULTRASOUND OF THE TESTICLES TECHNIQUE: Complete ultrasound examination of the testicles, epididymis, and other scrotal structures was performed. Color and spectral Doppler ultrasound were also utilized to evaluate blood flow to the testicles. COMPARISON:  None Available. FINDINGS: Right testicle Measurements: 3.0 x 1.7 x 2.4 cm. No mass or microlithiasis visualized. Doppler: There is normal vascularity on color doppler examination. Spectral doppler arterial and venous waveforms are normal. Left testicle Measurements:  3.0 x 1.9 x 2.6 cm. No mass or microlithiasis visualized. Doppler: There is normal vascularity on color doppler examination. Spectral doppler arterial and venous waveforms are normal. Right epididymis:  Normal in size and appearance. Left epididymis: Normal in size and appearance. 3 mm epididymal head cyst. Hydrocele:  None visualized. Varicocele:  None visualized. IMPRESSION: No acute abnormality identified. Electronically Signed   By: Harrietta Sherry M.D.   On: 11/02/2024 13:02   CT ABDOMEN PELVIS W CONTRAST Result Date: 11/02/2024 EXAM: CT ABDOMEN AND PELVIS WITH CONTRAST 11/02/2024 11:09:09 AM TECHNIQUE: CT of the abdomen and pelvis was performed with the administration of 60 mL of iohexol  (OMNIPAQUE ) 350 MG/ML injection. Multiplanar reformatted images are provided for review. Automated exposure control, iterative reconstruction, and/or weight-based  adjustment of the mA/kV was utilized to reduce the radiation dose to as low as reasonably achievable. COMPARISON: Comparison is made to 07/14/2023. CLINICAL HISTORY: Left inguinal tenderness and possible hernia. FINDINGS: LOWER CHEST: No acute abnormality. LIVER: The liver is unremarkable. GALLBLADDER AND BILE DUCTS: Cholelithiasis without superimposed pericholecystic inflammatory change. No intra or extrahepatic biliary ductal dilation. SPLEEN: No acute abnormality. PANCREAS: Lobulated cystic lesion is again seen within the uncinate process of the pancreas measuring 2.0 x 2.4 cm, mildly enlarged since prior examination. Several small septa are noted and the lesion may represent a serous cystadenoma, but is  not optimally characterized on this examination. If indicated, this will be better assessed with dedicated contrast enhanced MRI examination. ADRENAL GLANDS: No acute abnormality. KIDNEYS, URETERS AND BLADDER: No stones in the kidneys or ureters. No hydronephrosis. No perinephric or periureteral stranding. Urinary bladder is unremarkable. GI AND BOWEL: Small left inguinal hernia contains a single loop of unremarkable mid small bowel. Small fat-containing right inguinal hernia. The stomach, small bowel, and large bowel are otherwise unremarkable. Appendix normal. There is no bowel obstruction. PERITONEUM AND RETROPERITONEUM: No ascites. No free air. VASCULATURE: Aorta is normal in caliber. Mild aortoiliac atherosclerotic calcification. No aortic aneurysm. LYMPH NODES: No lymphadenopathy. REPRODUCTIVE ORGANS: No acute abnormality. BONES AND SOFT TISSUES: No acute osseous abnormality. No focal soft tissue abnormality. IMPRESSION: 1. Small left inguinal hernia containing a single loop of unremarkable mid small bowel. No superimposed inflammatory changes. 2. Small fat-containing right inguinal hernia. 3. Lobulated cystic lesion within the uncinate process of the pancreas, mildly enlarged since prior examination,  indeterminate; recommend pancreas-protocol MRI for further characterization, if clinically indicated . Electronically signed by: Dorethia Molt MD 11/02/2024 11:55 AM EST RP Workstation: [id number]   DG Chest 2 View Result Date: 11/02/2024 CLINICAL DATA:  Fall EXAM: CHEST - 2 VIEW COMPARISON:  March 09, 2024 FINDINGS: The heart size and mediastinal contours are within normal limits. Both lungs are clear. The visualized skeletal structures are unremarkable. IMPRESSION: No active cardiopulmonary disease. Electronically Signed   By: Lynwood Landy Raddle M.D.   On: 11/02/2024 08:56   DG Knee Complete 4 Views Left Result Date: 11/02/2024 CLINICAL DATA:  Fall EXAM: LEFT KNEE - COMPLETE 4+ VIEW COMPARISON:  None Available. FINDINGS: No evidence of fracture, dislocation, or joint effusion. Status post ACL reconstruction. Mild patellar spurring. Moderate narrowing of medial joint space. Soft tissues are unremarkable. IMPRESSION: No acute abnormality seen. Electronically Signed   By: Lynwood Landy Raddle M.D.   On: 11/02/2024 08:55   DG Femur Min 2 Views Left Result Date: 11/02/2024 CLINICAL DATA:  Fall EXAM: LEFT FEMUR 2 VIEWS COMPARISON:  None Available. FINDINGS: There is no evidence of fracture or other focal bone lesions. Soft tissues are unremarkable. Status post ACL reconstruction. IMPRESSION: No acute abnormality seen. Electronically Signed   By: Lynwood Landy Raddle M.D.   On: 11/02/2024 08:54   DG Pelvis 1-2 Views Result Date: 11/02/2024 CLINICAL DATA:  Fall EXAM: PELVIS - 1-2 VIEW COMPARISON:  None Available. FINDINGS: There is no evidence of pelvic fracture or diastasis. No pelvic bone lesions are seen. IMPRESSION: Negative. Electronically Signed   By: Lynwood Landy Raddle M.D.   On: 11/02/2024 08:52   CT Cervical Spine Wo Contrast Result Date: 11/02/2024 EXAM: CT CERVICAL SPINE WITHOUT CONTRAST 11/02/2024 08:03:00 AM TECHNIQUE: CT of the cervical spine was performed without the administration of intravenous  contrast. Multiplanar reformatted images are provided for review. Automated exposure control, iterative reconstruction, and/or weight based adjustment of the mA/kV was utilized to reduce the radiation dose to as low as reasonably achievable. COMPARISON: None available. CLINICAL HISTORY: Polytrauma, blunt Polytrauma, blunt FINDINGS: CERVICAL SPINE: BONES AND ALIGNMENT: No acute fracture or traumatic malalignment. DEGENERATIVE CHANGES: No significant degenerative changes. SOFT TISSUES: No prevertebral soft tissue swelling. IMPRESSION: 1. No acute abnormality of the cervical spine. Electronically signed by: Evalene Coho MD 11/02/2024 08:09 AM EST RP Workstation: [id number]   CT Head Wo Contrast Result Date: 11/02/2024 EXAM: CT HEAD WITHOUT 11/02/2024 08:03:00 AM TECHNIQUE: CT of the head was performed without the administration of intravenous contrast. Automated  exposure control, iterative reconstruction, and/or weight based adjustment of the mA/kV was utilized to reduce the radiation dose to as low as reasonably achievable. COMPARISON: CT of the head dated 10/10/2012. CLINICAL HISTORY: Head trauma, minor (Age >= 65y). FINDINGS: BRAIN AND VENTRICLES: No acute intracranial hemorrhage. No mass effect or midline shift. No extra-axial fluid collection. No evidence of acute infarct. No hydrocephalus. Age indeterminate infarct in the right thalamus. There is age-related atrophy and mild-to-moderate cerebral white matter disease. There is no evidence of acute intracranial injury. ORBITS: No acute abnormality. Bilateral lens replacement noted. SINUSES AND MASTOIDS: Right maxillary sinus: Complete opacification with hyperdensity, radiodense debris, and calcification, suggesting chronic sinusitis. Dehiscence and bowing of the medial wall. Left maxillary sinus: Polypoid mucosal thickening within the floor. Right ethmoid sinus/air cells: Mucosal thickening and moderate opacification. SOFT TISSUES AND SKULL: No acute skull  fracture. No acute soft tissue abnormality. IMPRESSION: 1. No acute intracranial abnormality. 2. Age indeterminate infarct in the right thalamus. 3. 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 Polypoid mucosal thickening in the left maxillary sinus. Electronically signed by: Evalene Coho MD 11/02/2024 08:08 AM EST RP Workstation: [id number]       Elgie Butter M.D. [facility] 11/03/2024, 6:58 PM  Available via Epic secure chat 7am-7pm After 7 pm, please refer to night coverage provider listed on amion.

## 2024-11-04 LAB — COMPREHENSIVE METABOLIC PANEL WITH GFR
ALT: 19 U/L (ref 0–44)
AST: 22 U/L (ref 15–41)
Albumin: 3.5 g/dL (ref 3.5–5.0)
Alkaline Phosphatase: 67 U/L (ref 38–126)
Anion gap: 11 (ref 5–15)
BUN: 26 mg/dL — ABNORMAL HIGH (ref 8–23)
CO2: 27 mmol/L (ref 22–32)
Calcium: 9.4 mg/dL (ref 8.9–10.3)
Chloride: 104 mmol/L (ref 98–111)
Creatinine, Ser: 1.89 mg/dL — ABNORMAL HIGH (ref 0.61–1.24)
GFR, Estimated: 37 mL/min — ABNORMAL LOW (ref >60)
Glucose, Bld: 138 mg/dL — ABNORMAL HIGH (ref 70–99)
Potassium: 4.2 mmol/L (ref 3.5–5.1)
Sodium: 142 mmol/L (ref 135–145)
Total Bilirubin: 0.9 mg/dL (ref 0.0–1.2)
Total Protein: 7.1 g/dL (ref 6.5–8.1)

## 2024-11-04 LAB — GLUCOSE, CAPILLARY
Glucose-Capillary: 114 mg/dL — ABNORMAL HIGH (ref 70–99)
Glucose-Capillary: 129 mg/dL — ABNORMAL HIGH (ref 70–99)
Glucose-Capillary: 183 mg/dL — ABNORMAL HIGH (ref 70–99)
Glucose-Capillary: 192 mg/dL — ABNORMAL HIGH (ref 70–99)
Glucose-Capillary: 237 mg/dL — ABNORMAL HIGH (ref 70–99)
Glucose-Capillary: 238 mg/dL — ABNORMAL HIGH (ref 70–99)

## 2024-11-04 LAB — CBC
HCT: 32.5 % — ABNORMAL LOW (ref 39.0–52.0)
Hemoglobin: 10.7 g/dL — ABNORMAL LOW (ref 13.0–17.0)
MCH: 31.3 pg (ref 26.0–34.0)
MCHC: 32.9 g/dL (ref 30.0–36.0)
MCV: 95 fL (ref 80.0–100.0)
Platelets: 165 K/uL (ref 150–400)
RBC: 3.42 MIL/uL — ABNORMAL LOW (ref 4.22–5.81)
RDW: 12.7 % (ref 11.5–15.5)
WBC: 6.7 K/uL (ref 4.0–10.5)
nRBC: 0 % (ref 0.0–0.2)

## 2024-11-04 MED ORDER — PANTOPRAZOLE SODIUM 40 MG PO TBEC
40.0000 mg | DELAYED_RELEASE_TABLET | Freq: Every day | ORAL | Status: DC
  Administered 2024-11-04 – 2024-11-06 (×3): 40 mg via ORAL
  Filled 2024-11-04 (×3): qty 1

## 2024-11-04 MED ORDER — ALUM & MAG HYDROXIDE-SIMETH 200-200-20 MG/5ML PO SUSP
15.0000 mL | ORAL | Status: DC | PRN
  Administered 2024-11-04 (×2): 15 mL via ORAL
  Filled 2024-11-04 (×2): qty 30

## 2024-11-04 NOTE — Plan of Care (Signed)
   Problem: Education: Goal: Ability to describe self-care measures that may prevent or decrease complications (Diabetes Survival Skills Education) will improve Outcome: Progressing

## 2024-11-04 NOTE — Care Management CC44 (Signed)
 Condition Code 44 Documentation Completed  Patient Details  Name: Tilmon Wisehart MRN: [id number] Date of Birth: 08/04/1951   Condition Code 44 given:  Yes Patient signature on Condition Code 44 notice:  Yes Documentation of 2 MD's agreement:  Yes Code 44 added to claim:  Yes    Robynn Eileen Hoose, RN 11/04/2024, 9:26 AM

## 2024-11-04 NOTE — Plan of Care (Signed)
°  Problem: Education: Goal: Ability to describe self-care measures that may prevent or decrease complications (Diabetes Survival Skills Education) will improve Outcome: Progressing   Problem: Health Behavior/Discharge Planning: Goal: Ability to identify and utilize available resources and services will improve Outcome: Progressing   Problem: Skin Integrity: Goal: Risk for impaired skin integrity will decrease Outcome: Progressing   Problem: Nutritional: Goal: Maintenance of adequate nutrition will improve Outcome: Completed/Met

## 2024-11-04 NOTE — Care Management Obs Status (Signed)
 MEDICARE OBSERVATION STATUS NOTIFICATION   Patient Details  Name: [name] MRN: [id number] Date of Birth: 1951/10/06   Medicare Observation Status Notification Given:  Yes    Robynn Eileen Hoose, RN 11/04/2024, 9:26 AM

## 2024-11-04 NOTE — Progress Notes (Signed)
 [facility]                                                                               [name], is a 73 y.o. male, DOB - 10/29/1951, FMW:[id number] Admit date - 11/02/2024    Outpatient Primary MD for the patient is [name], Glade PARAS, MD  LOS - 2  days    Brief summary   [name] is a 72 y.o. 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   He was also found to have left inguinal hernia with bowel which couldn't be reduced. General surgery consulted.   Assessment & Plan     Left leg pain X rays are unremarkable.  Venous duplex is negative for DVT.  Pain control.prn muscle relaxants.  Physical therapy evaluations.    Left inguinal hernia Gen surgery on board. Plan for hernia repair when the schedule allows.  Pain control.  Liquid diet.    Type 2 DM CBG (last 3)  Recent Labs    11/04/24 0431 11/04/24 0755 11/04/24 1200  GLUCAP 114* 129* 238*   Resume SSI.  A1c is 7.6%   Anemia of chronic disease Hemoglobin stable between 9 to 10.    Stage 3b CKD Creatinine at baseline.  Creatinine at 1.89    Hypertension Well controlled.  Resume coreg .    Hyperlipidemia Resume statin.      Lobulated cystic lesion within the uncinate process of the pancreas, mildly enlarged since prior examination, indeterminate; recommend pancreas-protocol MRI for further characterization as outpatient.  Discussed with patient.   Estimated body mass index is 30.32 kg/m as calculated from the following:   Height as of this encounter: 5' 11 (1.803 m).   Weight as of this encounter: 98.6 kg.  Code Status: full code.  DVT Prophylaxis:  heparin  injection 5,000 Units Start: 11/02/24 2200   Level of Care: Level of care: Telemetry Family Communication: none at bedside.    Disposition Plan:     Remains inpatient appropriate:  further eval for hernia repair.    Procedures:  Hernia repair next week.   Consultants:   General surgery  Antimicrobials:   Anti-infectives (From admission, onward)    None        Medications  Scheduled Meds:  atorvastatin   40 mg Oral QHS   carvedilol   25 mg Oral BID   feeding supplement  1 Container Oral TID BM   heparin   5,000 Units Subcutaneous Q12H   insulin  aspart  0-9 Units Subcutaneous Q4H   pantoprazole   40 mg Oral Q0600   sodium chloride  flush  3 mL Intravenous Q12H   Continuous Infusions: PRN Meds:.acetaminophen  **OR** acetaminophen , alum & mag hydroxide-simeth, cyclobenzaprine , fentaNYL  (SUBLIMAZE ) injection, fluticasone , HYDROcodone -acetaminophen     Subjective:   [name] was seen and examined today.let pain improved. But not resolved. Worried about him being on observation.   Objective:   Vitals:   11/03/24 2101 11/04/24 0433 11/04/24 0500 11/04/24 0816  BP: 135/69 135/68  134/70  Pulse: ROLLEN)  56 61  62  Resp: 16 16  18   Temp: 98.8 F (37.1 C) 98.6 F (37 C)  97.8 F (36.6 C)  TempSrc: Oral   Oral  SpO2: 100% 98%  100%  Weight:   98.6 kg   Height:        Intake/Output Summary (Last 24 hours) at 11/04/2024 1221 Last data filed at 11/04/2024 0829 Gross per 24 hour  Intake --  Output 1150 ml  Net -1150 ml   Filed Weights   11/02/24 2226 11/03/24 0500 11/04/24 0500  Weight: 100.8 kg 97.4 kg 98.6 kg     Exam General exam: Appears calm and comfortable  Respiratory system: Clear to auscultation. Respiratory effort normal. Cardiovascular system: S1 & S2 heard, RRR.  Gastrointestinal system: Abdomen is nondistended, soft and nontender.  Central nervous system: Alert and oriented. No focal neurological deficits. Extremities: left leg pain improved.  Skin: No rashes Psychiatry: . Mood & affect appropriate.     Data Reviewed:  I have personally reviewed following labs  and imaging studies   CBC Lab Results  Component Value Date   WBC 6.7 11/04/2024   RBC 3.42 (L) 11/04/2024   HGB 10.7 (L) 11/04/2024   HCT 32.5 (L) 11/04/2024   MCV 95.0 11/04/2024   MCH 31.3 11/04/2024   PLT 165 11/04/2024   MCHC 32.9 11/04/2024   RDW 12.7 11/04/2024   LYMPHSABS 2.2 11/02/2024   MONOABS 0.9 11/02/2024   EOSABS 0.3 11/02/2024   BASOSABS 0.0 11/02/2024     Last metabolic panel Lab Results  Component Value Date   NA 142 11/04/2024   K 4.2 11/04/2024   CL 104 11/04/2024   CO2 27 11/04/2024   BUN 26 (H) 11/04/2024   CREATININE 1.89 (H) 11/04/2024   GLUCOSE 138 (H) 11/04/2024   GFRNONAA 37 (L) 11/04/2024   GFRAA 32 (L) 09/10/2020   CALCIUM  9.4 11/04/2024   PHOS 3.6 08/16/2012   PROT 7.1 11/04/2024   ALBUMIN 3.5 11/04/2024   LABGLOB 3.1 12/24/2022   AGRATIO 1.3 12/24/2022   BILITOT 0.9 11/04/2024   ALKPHOS 67 11/04/2024   AST 22 11/04/2024   ALT 19 11/04/2024   ANIONGAP 11 11/04/2024    CBG (last 3)  Recent Labs    11/04/24 0431 11/04/24 0755 11/04/24 1200  GLUCAP 114* 129* 238*      Coagulation Profile: No results for input(s): INR, PROTIME in the last 168 hours.   Radiology Studies: VAS US  LOWER EXTREMITY VENOUS (DVT) Result Date: 11/03/2024  Lower Venous DVT Study Patient Name:  [name]  Date of Exam:   11/03/2024 Medical Rec #: [id number]           Accession #:    [id number] Date of Birth: 1951-07-15          Patient Gender: M Patient Age:   73 years Exam Location:  [facility] Procedure:      VAS US  LOWER EXTREMITY VENOUS (DVT) Referring Phys: EKTA PATEL --------------------------------------------------------------------------------  Indications: Left lateral thigh pain.  Comparison Study: No previous exams Performing Technologist: Jody Hill RVT, RDMS  Examination Guidelines: A complete evaluation includes B-mode imaging, spectral Doppler, color Doppler, and power Doppler as needed of all accessible portions of  each vessel. Bilateral testing is considered an integral part of a complete examination. Limited examinations for reoccurring indications may be performed as noted. The reflux portion of the exam is performed with the patient in reverse Trendelenburg.  +-----+---------------+---------+-----------+----------+--------------+ RIGHTCompressibilityPhasicitySpontaneityPropertiesThrombus Aging +-----+---------------+---------+-----------+----------+--------------+ CFV  Full  Yes      Yes                                 +-----+---------------+---------+-----------+----------+--------------+   +---------+---------------+---------+-----------+----------+--------------+ LEFT     CompressibilityPhasicitySpontaneityPropertiesThrombus Aging +---------+---------------+---------+-----------+----------+--------------+ CFV      Full           Yes      Yes                                 +---------+---------------+---------+-----------+----------+--------------+ SFJ      Full                                                        +---------+---------------+---------+-----------+----------+--------------+ FV Prox  Full           Yes      Yes                                 +---------+---------------+---------+-----------+----------+--------------+ FV Mid   Full           Yes      Yes                                 +---------+---------------+---------+-----------+----------+--------------+ FV DistalFull           Yes      Yes                                 +---------+---------------+---------+-----------+----------+--------------+ PFV      Full                                                        +---------+---------------+---------+-----------+----------+--------------+ POP      Full           Yes      Yes                                 +---------+---------------+---------+-----------+----------+--------------+ PTV      Full                                                         +---------+---------------+---------+-----------+----------+--------------+ PERO     Full                                                        +---------+---------------+---------+-----------+----------+--------------+     Summary: RIGHT: - No evidence of common femoral vein obstruction.  - There is no evidence of deep  vein thrombosis in the lower extremity.  LEFT: - There is no evidence of deep vein thrombosis in the lower extremity.  *See table(s) above for measurements and observations. Electronically signed by Gaile New MD on 11/03/2024 at 6:07:52 PM.    Final    MR BRAIN WO CONTRAST Result Date: 11/02/2024 EXAM: MRI BRAIN WITHOUT CONTRAST 11/02/2024 01:52:46 PM TECHNIQUE: Multiplanar multisequence MRI of the head/brain was performed without the administration of intravenous contrast. COMPARISON: Head CT 11/02/2024 and MRI 10/11/2012. CLINICAL HISTORY: Neuro deficit, acute, stroke suspected; Patient has left-sided deficits and CT shows possible age-indeterminate stroke. He did fall when his left leg gave out on him. Rule out new stroke. FINDINGS: BRAIN AND VENTRICLES: No acute infarct, mass, midline shift, hydrocephalus, or extra-axial fluid collection is identified. There is a chronic infarct involving the right thalamus and posterior limb of the right internal capsule with associated chronic blood products, and this infarct was acute in 2013. A chronic microhemorrhage in the right occipital lobe is unchanged. Patchy T2 hyperintensities in the cerebral white matter and pons have mildly progressed from 2013 and are nonspecific but compatible with moderate chronic small vessel ischemic disease. There are multiple chronic lacunar infarcts in the basal ganglia and cerebellum bilaterally which are largely new from 2013. There is mild generalized cerebral atrophy. Major intracranial vascular flow voids are preserved. ORBITS: Bilateral cataract extraction. SINUSES  AND MASTOIDS: Chronic right maxillary sinusitis with sinus expansion and bowing into the nasal cavity as described on CT. Extensive right anterior and mid ethmoid air cell opacification as well. No significant mastoid fluid. BONES AND SOFT TISSUES: Normal marrow signal. No acute soft tissue abnormality. IMPRESSION: 1. No acute intracranial abnormality. 2. Chronic right thalamic infarct. 3. Moderate chronic small vessel ischemic disease. 4. Small chronic cerebellar infarcts. Electronically signed by: Dasie Hamburg MD 11/02/2024 02:11 PM EST RP Workstation: [id number]       Elgie Butter M.D. [facility] 11/04/2024, 12:21 PM  Available via Epic secure chat 7am-7pm After 7 pm, please refer to night coverage provider listed on amion.

## 2024-11-04 NOTE — Progress Notes (Signed)
 Progress Note     Subjective: Patient reports pain of left groin is similar. No new concerns. Having flatulence. No BM this morning. Tolerating FLD without n/v.   ROS  All negative with the exception of above.  Objective: Vital signs in last 24 hours: Temp:  [97.8 F (36.6 C)-98.8 F (37.1 C)] 97.8 F (36.6 C) (12/14 0816) Pulse Rate:  [56-66] 62 (12/14 0816) Resp:  [16-18] 18 (12/14 0816) BP: (134-151)/(68-77) 134/70 (12/14 0816) SpO2:  [98 %-100 %] 100 % (12/14 0816) Weight:  [98.6 kg] 98.6 kg (12/14 0500) Last BM Date : 11/02/24  Intake/Output from previous day: 12/13 0701 - 12/14 0700 In: -  Out: 900 [Urine:900] Intake/Output this shift: No intake/output data recorded.  PE: General: Pleasant male laying on hospital bed in NAD. HEENT: head -normocephalic, atraumatic. CV- Normal HR during encounter. Pulm- Breathing is non-labored ORA Abd- soft, NT/ND, appropriate bowel sounds. There is a visible and palpable bulge in the left groin consistent with the hernia on CT. No cellulitis or overlying skin changes. Area is tender. I am unable to reduce the hernia. Overall abdominal exam similar to prior.  MSK- UE/LE symmetrical, no cyanosis, clubbing, or edema.  Psych- Alert and Oriented x3 with appropriate affect   Lab Results:  Recent Labs    11/02/24 2342 11/04/24 0143  WBC 7.1 6.7  HGB 9.9* 10.7*  HCT 29.3* 32.5*  PLT 162 165   BMET Recent Labs    11/02/24 2342 11/04/24 0143  NA 141 142  K 3.8 4.2  CL 108 104  CO2 23 27  GLUCOSE 216* 138*  BUN 32* 26*  CREATININE 2.02* 1.89*  CALCIUM  8.9 9.4   PT/INR No results for input(s): LABPROT, INR in the last 72 hours. CMP     Component Value Date/Time   NA 142 11/04/2024 0143   NA 138 01/25/2023 1120   K 4.2 11/04/2024 0143   CL 104 11/04/2024 0143   CO2 27 11/04/2024 0143   GLUCOSE 138 (H) 11/04/2024 0143   BUN 26 (H) 11/04/2024 0143   BUN 32 (H) 01/25/2023 1120   CREATININE 1.89 (H) 11/04/2024  0143   CALCIUM  9.4 11/04/2024 0143   PROT 7.1 11/04/2024 0143   PROT 7.2 12/24/2022 0812   ALBUMIN 3.5 11/04/2024 0143   ALBUMIN 4.1 12/24/2022 0812   AST 22 11/04/2024 0143   ALT 19 11/04/2024 0143   ALKPHOS 67 11/04/2024 0143   BILITOT 0.9 11/04/2024 0143   BILITOT 0.8 12/24/2022 0812   GFRNONAA 37 (L) 11/04/2024 0143   GFRAA 32 (L) 09/10/2020 1437   Lipase     Component Value Date/Time   LIPASE 57 (H) 07/15/2023 0553       Studies/Results: VAS US  LOWER EXTREMITY VENOUS (DVT) Result Date: 11/03/2024  Lower Venous DVT Study Patient Name:  [name]  Date of Exam:   11/03/2024 Medical Rec #: [id number]           Accession #:    [id number] Date of Birth: 19-Jul-1951          Patient Gender: M Patient Age:   73 years Exam Location:  [facility] Procedure:      VAS US  LOWER EXTREMITY VENOUS (DVT) Referring Phys: EKTA PATEL --------------------------------------------------------------------------------  Indications: Left lateral thigh pain.  Comparison Study: No previous exams Performing Technologist: Jody Hill RVT, RDMS  Examination Guidelines: A complete evaluation includes B-mode imaging, spectral Doppler, color Doppler, and power Doppler as needed of all accessible portions of each  vessel. Bilateral testing is considered an integral part of a complete examination. Limited examinations for reoccurring indications may be performed as noted. The reflux portion of the exam is performed with the patient in reverse Trendelenburg.  +-----+---------------+---------+-----------+----------+--------------+ RIGHTCompressibilityPhasicitySpontaneityPropertiesThrombus Aging +-----+---------------+---------+-----------+----------+--------------+ CFV  Full           Yes      Yes                                 +-----+---------------+---------+-----------+----------+--------------+   +---------+---------------+---------+-----------+----------+--------------+ LEFT      CompressibilityPhasicitySpontaneityPropertiesThrombus Aging +---------+---------------+---------+-----------+----------+--------------+ CFV      Full           Yes      Yes                                 +---------+---------------+---------+-----------+----------+--------------+ SFJ      Full                                                        +---------+---------------+---------+-----------+----------+--------------+ FV Prox  Full           Yes      Yes                                 +---------+---------------+---------+-----------+----------+--------------+ FV Mid   Full           Yes      Yes                                 +---------+---------------+---------+-----------+----------+--------------+ FV DistalFull           Yes      Yes                                 +---------+---------------+---------+-----------+----------+--------------+ PFV      Full                                                        +---------+---------------+---------+-----------+----------+--------------+ POP      Full           Yes      Yes                                 +---------+---------------+---------+-----------+----------+--------------+ PTV      Full                                                        +---------+---------------+---------+-----------+----------+--------------+ PERO     Full                                                        +---------+---------------+---------+-----------+----------+--------------+  Summary: RIGHT: - No evidence of common femoral vein obstruction.  - There is no evidence of deep vein thrombosis in the lower extremity.  LEFT: - There is no evidence of deep vein thrombosis in the lower extremity.  *See table(s) above for measurements and observations. Electronically signed by Gaile New MD on 11/03/2024 at 6:07:52 PM.    Final    MR BRAIN WO CONTRAST Result Date: 11/02/2024 EXAM: MRI BRAIN WITHOUT  CONTRAST 11/02/2024 01:52:46 PM TECHNIQUE: Multiplanar multisequence MRI of the head/brain was performed without the administration of intravenous contrast. COMPARISON: Head CT 11/02/2024 and MRI 10/11/2012. CLINICAL HISTORY: Neuro deficit, acute, stroke suspected; Patient has left-sided deficits and CT shows possible age-indeterminate stroke. He did fall when his left leg gave out on him. Rule out new stroke. FINDINGS: BRAIN AND VENTRICLES: No acute infarct, mass, midline shift, hydrocephalus, or extra-axial fluid collection is identified. There is a chronic infarct involving the right thalamus and posterior limb of the right internal capsule with associated chronic blood products, and this infarct was acute in 2013. A chronic microhemorrhage in the right occipital lobe is unchanged. Patchy T2 hyperintensities in the cerebral white matter and pons have mildly progressed from 2013 and are nonspecific but compatible with moderate chronic small vessel ischemic disease. There are multiple chronic lacunar infarcts in the basal ganglia and cerebellum bilaterally which are largely new from 2013. There is mild generalized cerebral atrophy. Major intracranial vascular flow voids are preserved. ORBITS: Bilateral cataract extraction. SINUSES AND MASTOIDS: Chronic right maxillary sinusitis with sinus expansion and bowing into the nasal cavity as described on CT. Extensive right anterior and mid ethmoid air cell opacification as well. No significant mastoid fluid. BONES AND SOFT TISSUES: Normal marrow signal. No acute soft tissue abnormality. IMPRESSION: 1. No acute intracranial abnormality. 2. Chronic right thalamic infarct. 3. Moderate chronic small vessel ischemic disease. 4. Small chronic cerebellar infarcts. Electronically signed by: Dasie Hamburg MD 11/02/2024 02:11 PM EST RP Workstation: [id number]   US  SCROTUM W/DOPPLER Result Date: 11/02/2024 CLINICAL DATA:  Left groin pain. EXAM: SCROTAL ULTRASOUND DOPPLER  ULTRASOUND OF THE TESTICLES TECHNIQUE: Complete ultrasound examination of the testicles, epididymis, and other scrotal structures was performed. Color and spectral Doppler ultrasound were also utilized to evaluate blood flow to the testicles. COMPARISON:  None Available. FINDINGS: Right testicle Measurements: 3.0 x 1.7 x 2.4 cm. No mass or microlithiasis visualized. Doppler: There is normal vascularity on color doppler examination. Spectral doppler arterial and venous waveforms are normal. Left testicle Measurements:  3.0 x 1.9 x 2.6 cm. No mass or microlithiasis visualized. Doppler: There is normal vascularity on color doppler examination. Spectral doppler arterial and venous waveforms are normal. Right epididymis:  Normal in size and appearance. Left epididymis: Normal in size and appearance. 3 mm epididymal head cyst. Hydrocele:  None visualized. Varicocele:  None visualized. IMPRESSION: No acute abnormality identified. Electronically Signed   By: Harrietta Sherry M.D.   On: 11/02/2024 13:02   CT ABDOMEN PELVIS W CONTRAST Result Date: 11/02/2024 EXAM: CT ABDOMEN AND PELVIS WITH CONTRAST 11/02/2024 11:09:09 AM TECHNIQUE: CT of the abdomen and pelvis was performed with the administration of 60 mL of iohexol  (OMNIPAQUE ) 350 MG/ML injection. Multiplanar reformatted images are provided for review. Automated exposure control, iterative reconstruction, and/or weight-based adjustment of the mA/kV was utilized to reduce the radiation dose to as low as reasonably achievable. COMPARISON: Comparison is made to 07/14/2023. CLINICAL HISTORY: Left inguinal tenderness and possible hernia. FINDINGS: LOWER CHEST: No acute abnormality. LIVER: The liver is  unremarkable. GALLBLADDER AND BILE DUCTS: Cholelithiasis without superimposed pericholecystic inflammatory change. No intra or extrahepatic biliary ductal dilation. SPLEEN: No acute abnormality. PANCREAS: Lobulated cystic lesion is again seen within the uncinate process of the  pancreas measuring 2.0 x 2.4 cm, mildly enlarged since prior examination. Several small septa are noted and the lesion may represent a serous cystadenoma, but is not optimally characterized on this examination. If indicated, this will be better assessed with dedicated contrast enhanced MRI examination. ADRENAL GLANDS: No acute abnormality. KIDNEYS, URETERS AND BLADDER: No stones in the kidneys or ureters. No hydronephrosis. No perinephric or periureteral stranding. Urinary bladder is unremarkable. GI AND BOWEL: Small left inguinal hernia contains a single loop of unremarkable mid small bowel. Small fat-containing right inguinal hernia. The stomach, small bowel, and large bowel are otherwise unremarkable. Appendix normal. There is no bowel obstruction. PERITONEUM AND RETROPERITONEUM: No ascites. No free air. VASCULATURE: Aorta is normal in caliber. Mild aortoiliac atherosclerotic calcification. No aortic aneurysm. LYMPH NODES: No lymphadenopathy. REPRODUCTIVE ORGANS: No acute abnormality. BONES AND SOFT TISSUES: No acute osseous abnormality. No focal soft tissue abnormality. IMPRESSION: 1. Small left inguinal hernia containing a single loop of unremarkable mid small bowel. No superimposed inflammatory changes. 2. Small fat-containing right inguinal hernia. 3. Lobulated cystic lesion within the uncinate process of the pancreas, mildly enlarged since prior examination, indeterminate; recommend pancreas-protocol MRI for further characterization, if clinically indicated . Electronically signed by: Dorethia Molt MD 11/02/2024 11:55 AM EST RP Workstation: [id number]   DG Chest 2 View Result Date: 11/02/2024 CLINICAL DATA:  Fall EXAM: CHEST - 2 VIEW COMPARISON:  March 09, 2024 FINDINGS: The heart size and mediastinal contours are within normal limits. Both lungs are clear. The visualized skeletal structures are unremarkable. IMPRESSION: No active cardiopulmonary disease. Electronically Signed   By: Lynwood Landy Raddle M.D.    On: 11/02/2024 08:56   DG Knee Complete 4 Views Left Result Date: 11/02/2024 CLINICAL DATA:  Fall EXAM: LEFT KNEE - COMPLETE 4+ VIEW COMPARISON:  None Available. FINDINGS: No evidence of fracture, dislocation, or joint effusion. Status post ACL reconstruction. Mild patellar spurring. Moderate narrowing of medial joint space. Soft tissues are unremarkable. IMPRESSION: No acute abnormality seen. Electronically Signed   By: Lynwood Landy Raddle M.D.   On: 11/02/2024 08:55   DG Femur Min 2 Views Left Result Date: 11/02/2024 CLINICAL DATA:  Fall EXAM: LEFT FEMUR 2 VIEWS COMPARISON:  None Available. FINDINGS: There is no evidence of fracture or other focal bone lesions. Soft tissues are unremarkable. Status post ACL reconstruction. IMPRESSION: No acute abnormality seen. Electronically Signed   By: Lynwood Landy Raddle M.D.   On: 11/02/2024 08:54   DG Pelvis 1-2 Views Result Date: 11/02/2024 CLINICAL DATA:  Fall EXAM: PELVIS - 1-2 VIEW COMPARISON:  None Available. FINDINGS: There is no evidence of pelvic fracture or diastasis. No pelvic bone lesions are seen. IMPRESSION: Negative. Electronically Signed   By: Lynwood Landy Raddle M.D.   On: 11/02/2024 08:52    Anti-infectives: Anti-infectives (From admission, onward)    None        Assessment/Plan Left inguinal hernia containing small bowel originally presented for fall Fat containing right inguinal hernia  -Afebrile. -WBC yesterday 6.7; HGB 10.7 -Exam consistent with prior. No worsening symptoms or new concerns. -Tolerating FLD without n/v. No BM since 12/12. Having flatus. No n/v. -No emergent nature for hernia repair but he will need this fixed during his hospital stay.  -No acute surgical needs for right inguinal hernia -Will  continue to follow.   FEN - FLD. Would avoid solid diet in this patient with a bowel containing hernia VTE - SCD's, Heparin  injection (Plavix  held) ID - No abx indicated from a surgical standpoint   Insulin  dependent  diabetes with complications  CAD PMH CVA CKD Diabetic retinopathy Left leg pain - venous duplex neg for DVT.     LOS: 2 days   I reviewed specialist notes, hospitalist notes, nursing notes, last 24 h vitals and pain scores, last 48 h intake and output, last 24 h labs and trends, and last 24 h imaging results.  This care required moderate level of medical decision making.    Marjorie Carlyon Favre, [facility] 11/04/2024, 8:22 AM Please see Amion for pager number during day hours 7:00am-4:30pm

## 2024-11-05 ENCOUNTER — Encounter (HOSPITAL_COMMUNITY): Payer: Self-pay | Admitting: Internal Medicine

## 2024-11-05 ENCOUNTER — Observation Stay (HOSPITAL_COMMUNITY): Admitting: Anesthesiology

## 2024-11-05 ENCOUNTER — Encounter (HOSPITAL_COMMUNITY): Admission: EM | Disposition: A | Payer: Self-pay | Source: Home / Self Care | Attending: Internal Medicine

## 2024-11-05 DIAGNOSIS — K4030 Unilateral inguinal hernia, with obstruction, without gangrene, not specified as recurrent: Secondary | ICD-10-CM

## 2024-11-05 DIAGNOSIS — I2510 Atherosclerotic heart disease of native coronary artery without angina pectoris: Secondary | ICD-10-CM

## 2024-11-05 DIAGNOSIS — Z87891 Personal history of nicotine dependence: Secondary | ICD-10-CM

## 2024-11-05 HISTORY — PX: INGUINAL HERNIA REPAIR: SHX194

## 2024-11-05 LAB — COMPREHENSIVE METABOLIC PANEL WITH GFR
ALT: 17 U/L (ref 0–44)
AST: 20 U/L (ref 15–41)
Albumin: 3.2 g/dL — ABNORMAL LOW (ref 3.5–5.0)
Alkaline Phosphatase: 58 U/L (ref 38–126)
Anion gap: 5 (ref 5–15)
BUN: 22 mg/dL (ref 8–23)
CO2: 28 mmol/L (ref 22–32)
Calcium: 8.7 mg/dL — ABNORMAL LOW (ref 8.9–10.3)
Chloride: 106 mmol/L (ref 98–111)
Creatinine, Ser: 2.02 mg/dL — ABNORMAL HIGH (ref 0.61–1.24)
GFR, Estimated: 34 mL/min — ABNORMAL LOW (ref >60)
Glucose, Bld: 146 mg/dL — ABNORMAL HIGH (ref 70–99)
Potassium: 4.3 mmol/L (ref 3.5–5.1)
Sodium: 139 mmol/L (ref 135–145)
Total Bilirubin: 1.3 mg/dL — ABNORMAL HIGH (ref 0.0–1.2)
Total Protein: 6.5 g/dL (ref 6.5–8.1)

## 2024-11-05 LAB — CBC
HCT: 29.8 % — ABNORMAL LOW (ref 39.0–52.0)
Hemoglobin: 10 g/dL — ABNORMAL LOW (ref 13.0–17.0)
MCH: 31.9 pg (ref 26.0–34.0)
MCHC: 33.6 g/dL (ref 30.0–36.0)
MCV: 95.2 fL (ref 80.0–100.0)
Platelets: 153 K/uL (ref 150–400)
RBC: 3.13 MIL/uL — ABNORMAL LOW (ref 4.22–5.81)
RDW: 12.5 % (ref 11.5–15.5)
WBC: 7.5 K/uL (ref 4.0–10.5)
nRBC: 0 % (ref 0.0–0.2)

## 2024-11-05 LAB — GLUCOSE, CAPILLARY
Glucose-Capillary: 120 mg/dL — ABNORMAL HIGH (ref 70–99)
Glucose-Capillary: 129 mg/dL — ABNORMAL HIGH (ref 70–99)
Glucose-Capillary: 156 mg/dL — ABNORMAL HIGH (ref 70–99)
Glucose-Capillary: 162 mg/dL — ABNORMAL HIGH (ref 70–99)
Glucose-Capillary: 170 mg/dL — ABNORMAL HIGH (ref 70–99)
Glucose-Capillary: 233 mg/dL — ABNORMAL HIGH (ref 70–99)
Glucose-Capillary: 269 mg/dL — ABNORMAL HIGH (ref 70–99)
Glucose-Capillary: 287 mg/dL — ABNORMAL HIGH (ref 70–99)

## 2024-11-05 SURGERY — REPAIR, HERNIA, INGUINAL, ADULT
Anesthesia: Regional | Site: Inguinal | Laterality: Left

## 2024-11-05 MED ORDER — BUPIVACAINE HCL (PF) 0.25 % IJ SOLN
INTRAMUSCULAR | Status: DC | PRN
  Administered 2024-11-05: 14:00:00 10 mL

## 2024-11-05 MED ORDER — FENTANYL CITRATE (PF) 250 MCG/5ML IJ SOLN
INTRAMUSCULAR | Status: DC | PRN
  Administered 2024-11-05 (×2): 50 ug via INTRAVENOUS

## 2024-11-05 MED ORDER — ROCURONIUM BROMIDE 10 MG/ML (PF) SYRINGE
PREFILLED_SYRINGE | INTRAVENOUS | Status: DC | PRN
  Administered 2024-11-05: 13:00:00 60 mg via INTRAVENOUS

## 2024-11-05 MED ORDER — BUPIVACAINE-EPINEPHRINE (PF) 0.5% -1:200000 IJ SOLN
INTRAMUSCULAR | Status: DC | PRN
  Administered 2024-11-05: 13:00:00 30 mL via PERINEURAL

## 2024-11-05 MED ORDER — ORAL CARE MOUTH RINSE: 15.0000 mL | OROMUCOSAL | Status: DC | PRN

## 2024-11-05 MED ORDER — ACETAMINOPHEN 500 MG PO TABS
1000.0000 mg | ORAL_TABLET | Freq: Once | ORAL | Status: AC
  Administered 2024-11-05: 12:00:00 1000 mg via ORAL
  Filled 2024-11-05: qty 2

## 2024-11-05 MED ORDER — CHLORHEXIDINE GLUCONATE 0.12 % MT SOLN: 15.0000 mL | Freq: Once | OROMUCOSAL | Status: AC

## 2024-11-05 MED ORDER — CHLORHEXIDINE GLUCONATE 0.12 % MT SOLN
OROMUCOSAL | Status: AC
  Administered 2024-11-05: 12:00:00 15 mL via OROMUCOSAL
  Filled 2024-11-05: qty 15

## 2024-11-05 MED ORDER — HYDROMORPHONE HCL 1 MG/ML IJ SOLN: 0.5000 mg | INTRAMUSCULAR | Status: DC | PRN

## 2024-11-05 MED ORDER — PROPOFOL 10 MG/ML IV BOLUS
INTRAVENOUS | Status: AC
  Filled 2024-11-05: qty 20

## 2024-11-05 MED ORDER — ORAL CARE MOUTH RINSE: 15.0000 mL | Freq: Once | OROMUCOSAL | Status: AC

## 2024-11-05 MED ORDER — CIPROFLOXACIN IN D5W 400 MG/200ML IV SOLN
400.0000 mg | INTRAVENOUS | Status: AC
  Administered 2024-11-05: 13:00:00 400 mg via INTRAVENOUS
  Filled 2024-11-05 (×2): qty 200

## 2024-11-05 MED ORDER — ACETAMINOPHEN 500 MG PO TABS
1000.0000 mg | ORAL_TABLET | Freq: Four times a day (QID) | ORAL | Status: DC
  Administered 2024-11-05 – 2024-11-06 (×3): 1000 mg via ORAL
  Filled 2024-11-05 (×4): qty 2

## 2024-11-05 MED ORDER — EPHEDRINE SULFATE (PRESSORS) 25 MG/5ML IV SOSY
PREFILLED_SYRINGE | INTRAVENOUS | Status: DC | PRN
  Administered 2024-11-05: 14:00:00 5 mg via INTRAVENOUS

## 2024-11-05 MED ORDER — OXYCODONE HCL 5 MG/5ML PO SOLN
5.0000 mg | Freq: Once | ORAL | Status: AC | PRN
  Administered 2024-11-05: 15:00:00 5 mg via ORAL

## 2024-11-05 MED ORDER — FENTANYL CITRATE (PF) 250 MCG/5ML IJ SOLN
INTRAMUSCULAR | Status: AC
  Filled 2024-11-05: qty 5

## 2024-11-05 MED ORDER — AMISULPRIDE (ANTIEMETIC) 5 MG/2ML IV SOLN: 10.0000 mg | Freq: Once | INTRAVENOUS | Status: DC | PRN

## 2024-11-05 MED ORDER — SUGAMMADEX SODIUM 200 MG/2ML IV SOLN
INTRAVENOUS | Status: DC | PRN
  Administered 2024-11-05: 14:00:00 200 mg via INTRAVENOUS

## 2024-11-05 MED ORDER — HYDROCORTISONE 1 % EX CREA
TOPICAL_CREAM | Freq: Three times a day (TID) | CUTANEOUS | Status: AC
  Filled 2024-11-05: qty 28

## 2024-11-05 MED ORDER — ONDANSETRON HCL 4 MG/2ML IJ SOLN: 4.0000 mg | Freq: Once | INTRAMUSCULAR | Status: DC | PRN

## 2024-11-05 MED ORDER — OXYCODONE HCL 5 MG PO TABS: 5.0000 mg | ORAL_TABLET | Freq: Once | ORAL | Status: AC | PRN

## 2024-11-05 MED ORDER — HYDROMORPHONE HCL 1 MG/ML IJ SOLN: 0.2500 mg | INTRAMUSCULAR | Status: DC | PRN

## 2024-11-05 MED ORDER — OXYCODONE HCL 5 MG PO TABS
5.0000 mg | ORAL_TABLET | ORAL | Status: DC | PRN
  Administered 2024-11-05: 20:00:00 10 mg via ORAL
  Filled 2024-11-05 (×2): qty 2

## 2024-11-05 MED ORDER — SODIUM CHLORIDE 0.9 % IV SOLN: INTRAVENOUS | Status: DC

## 2024-11-05 MED ORDER — OXYCODONE HCL 5 MG/5ML PO SOLN
ORAL | Status: AC
  Filled 2024-11-05: qty 5

## 2024-11-05 MED ORDER — BUPIVACAINE HCL (PF) 0.25 % IJ SOLN
INTRAMUSCULAR | Status: AC
  Filled 2024-11-05: qty 30

## 2024-11-05 MED ORDER — PROPOFOL 10 MG/ML IV BOLUS
INTRAVENOUS | Status: DC | PRN
  Administered 2024-11-05: 13:00:00 150 mg via INTRAVENOUS

## 2024-11-05 MED ORDER — LIDOCAINE 2% (20 MG/ML) 5 ML SYRINGE
INTRAMUSCULAR | Status: DC | PRN
  Administered 2024-11-05: 13:00:00 60 mg via INTRAVENOUS

## 2024-11-05 MED ORDER — 0.9 % SODIUM CHLORIDE (POUR BTL) OPTIME
TOPICAL | Status: DC | PRN
  Administered 2024-11-05: 14:00:00 1000 mL

## 2024-11-05 MED ORDER — DEXAMETHASONE SOD PHOSPHATE PF 10 MG/ML IJ SOLN
INTRAMUSCULAR | Status: DC | PRN
  Administered 2024-11-05: 13:00:00 5 mg via INTRAVENOUS

## 2024-11-05 MED ORDER — PHENYLEPHRINE 80 MCG/ML (10ML) SYRINGE FOR IV PUSH (FOR BLOOD PRESSURE SUPPORT)
PREFILLED_SYRINGE | INTRAVENOUS | Status: DC | PRN
  Administered 2024-11-05: 13:00:00 160 ug via INTRAVENOUS
  Administered 2024-11-05: 13:00:00 240 ug via INTRAVENOUS
  Administered 2024-11-05: 13:00:00 80 ug via INTRAVENOUS

## 2024-11-05 SURGICAL SUPPLY — 34 items
BAG COUNTER SPONGE SURGICOUNT (BAG) ×1 IMPLANT
BLADE CLIPPER SURG (BLADE) IMPLANT
CANISTER SUCTION 3000ML PPV (SUCTIONS) IMPLANT
CHLORAPREP W/TINT 26 (MISCELLANEOUS) ×1 IMPLANT
COVER SURGICAL LIGHT HANDLE (MISCELLANEOUS) ×1 IMPLANT
DERMABOND ADVANCED .7 DNX12 (GAUZE/BANDAGES/DRESSINGS) ×1 IMPLANT
DRAIN PENROSE .5X12 LATEX STL (DRAIN) IMPLANT
DRAPE LAPAROTOMY TRNSV 102X78 (DRAPES) ×1 IMPLANT
ELECT CAUTERY BLADE 6.4 (BLADE) ×1 IMPLANT
ELECTRODE REM PT RTRN 9FT ADLT (ELECTROSURGICAL) ×1 IMPLANT
GAUZE 4X4 16PLY ~~LOC~~+RFID DBL (SPONGE) ×1 IMPLANT
GLOVE BIO SURGEON STRL SZ7 (GLOVE) ×1 IMPLANT
GLOVE BIOGEL PI IND STRL 7.5 (GLOVE) ×1 IMPLANT
GOWN STRL REUS W/ TWL LRG LVL3 (GOWN DISPOSABLE) ×2 IMPLANT
KIT BASIN OR (CUSTOM PROCEDURE TRAY) ×1 IMPLANT
KIT TURNOVER KIT B (KITS) ×1 IMPLANT
MARKER SKIN DUAL TIP RULER LAB (MISCELLANEOUS) ×1 IMPLANT
MESH ULTRAPRO 3X6 7.6X15CM (Mesh General) IMPLANT
NDL HYPO 25GX1X1/2 BEV (NEEDLE) ×1 IMPLANT
NEEDLE HYPO 25GX1X1/2 BEV (NEEDLE) ×1 IMPLANT
PACK GENERAL/GYN (CUSTOM PROCEDURE TRAY) ×1 IMPLANT
PAD ARMBOARD POSITIONER FOAM (MISCELLANEOUS) ×1 IMPLANT
PENCIL SMOKE EVACUATOR (MISCELLANEOUS) ×1 IMPLANT
SOLN 0.9% NACL POUR BTL 1000ML (IV SOLUTION) ×1 IMPLANT
SPIKE FLUID TRANSFER (MISCELLANEOUS) IMPLANT
STRIP CLOSURE SKIN 1/2X4 (GAUZE/BANDAGES/DRESSINGS) IMPLANT
SUT MNCRL AB 4-0 PS2 18 (SUTURE) ×1 IMPLANT
SUT VIC AB 2-0 CT1 TAPERPNT 27 (SUTURE) ×1 IMPLANT
SUT VIC AB 2-0 SH 18 (SUTURE) ×1 IMPLANT
SUT VIC AB 3-0 SH 27XBRD (SUTURE) ×1 IMPLANT
SUT VICRYL AB 2 0 TIES (SUTURE) ×1 IMPLANT
SYR CONTROL 10ML LL (SYRINGE) ×1 IMPLANT
TOWEL GREEN STERILE (TOWEL DISPOSABLE) ×1 IMPLANT
TOWEL GREEN STERILE FF (TOWEL DISPOSABLE) ×1 IMPLANT

## 2024-11-05 NOTE — Progress Notes (Signed)
 Tele box 6N06 taken off pt and returned to drawer at nurses station

## 2024-11-05 NOTE — Progress Notes (Signed)
 Consent for procedure signed and in pt chart. Pre-procedure checklist completed.

## 2024-11-05 NOTE — Progress Notes (Signed)
 Pt arrived back to 6 north room 30 alert and oriented x4. Pain level 4/10 at incision site. Incision site with steri strips all clean dry and intact. Bed in lowest position. Call light in reach. All needs met at this time.

## 2024-11-05 NOTE — Discharge Instructions (Signed)
 [facility] _______[facility]  UMBILICAL OR INGUINAL HERNIA REPAIR: POST OP INSTRUCTIONS  Always review your discharge instruction sheet given to you by the facility where your surgery was performed. IF YOU HAVE DISABILITY OR FAMILY LEAVE FORMS, YOU MUST BRING THEM TO THE OFFICE FOR PROCESSING.   DO NOT GIVE THEM TO YOUR DOCTOR.  1. A  prescription for pain medication may be given to you upon discharge.  Take your pain medication as prescribed, if needed.  If narcotic pain medicine is not needed, then you may take acetaminophen  (Tylenol ) or ibuprofen  (Advil ) as needed. 2. Take your usually prescribed medications unless otherwise directed. If you need a refill on your pain medication, please contact your pharmacy.  They will contact our office to request authorization. Prescriptions will not be filled after 5 pm or on week-ends. 3. You should follow a light diet the first 24 hours after arrival home, such as soup and crackers, etc.  Be sure to include lots of fluids daily.  Resume your normal diet the day after surgery. 4.Most patients will experience some swelling and bruising around the umbilicus or in the groin and scrotum.  Ice packs and reclining will help.  Swelling and bruising can take several days to resolve.  6. It is common to experience some constipation if taking pain medication after surgery.  Increasing fluid intake and taking a stool softener (such as Colace) will usually help or prevent this problem from occurring.  A mild laxative (Milk of Magnesia or Miralax ) should be taken according to package directions if there are no bowel movements after 48 hours. 7. Unless discharge instructions indicate otherwise, you may remove your bandages 24-48 hours after surgery, and you may shower at that time.  You may have steri-strips (small skin tapes) in place directly over the incision.  These strips should be left on the skin for 7-10 days.  If your surgeon used skin glue on the  incision, you may shower in 24 hours.  The glue will flake off over the next 2-3 weeks.  Any sutures or staples will be removed at the office during your follow-up visit. 8. ACTIVITIES:  You may resume regular (light) daily activities beginning the next day--such as daily self-care, walking, climbing stairs--gradually increasing activities as tolerated.  You may have sexual intercourse when it is comfortable.  Refrain from any heavy lifting or straining until approved by your doctor.  a.You may drive when you are no longer taking prescription pain medication, you can comfortably wear a seatbelt, and you can safely maneuver your car and apply brakes. b.RETURN TO WORK:     9.You should see your doctor in the office for a follow-up appointment approximately 2-3 weeks after your surgery.  Make sure that you call for this appointment within a day or two after you arrive home to insure a convenient appointment time.   WHEN TO CALL YOUR DOCTOR: Fever over 101.0 Inability to urinate Nausea and/or vomiting Extreme swelling or bruising Continued bleeding from incision. Increased pain, redness, or drainage from the incision  The clinic staff is available to answer your questions during regular business hours.  Please dont hesitate to call and ask to speak to one of the nurses for clinical concerns.  If you have a medical emergency, go to the nearest emergency room or call 911.  A surgeon from [facility] is always on call at the hospital   [address], [place], [place]  [address] ?  [id number], [place], [place]   [address] [phone] ? [phone] ? FAX [phone] Web site: [email or web address]

## 2024-11-05 NOTE — Op Note (Signed)
 Preoperative diagnosis: Incarcerated left inguinal hernia Postoperative diagnosis: Incarcerated left direct inguinal hernia Procedure: Left inguinal hernia repair with UltraPro mesh patch Surgeon: Dr. Adina Bury Anesthesia: General Estimated blood loss: Minimal Complications: Specimens: Drains: None Sponge no counts correct completion Disposition recovery stable condition  Indications: This is a 73 year old male with multiple medical problems who had a fall at home.  He had some left leg pain.  He underwent evaluation does not have any injury and does not have a DVT present.  During this evaluation he had a left inguinal hernia noted containing a small loop of bowel.  He was having no gastrointestinal symptoms.  He had been having left groin pain for 2 weeks prior to the fall.  He was admitted and was observed for several days due to the fact he was on Plavix .  I met him this morning and discussed proceeding to the operating room.  Procedure: After informed consent was obtained he was taken to the operative.  He was given antibiotics.  SCDs were placed.  He was placed under general anesthesia without complication.  He was prepped and draped in sterile sterile surgical fashion.  A surgical timeout was then performed.  I infiltrated Marcaine  throughout the left groin.  I then made an incision in his left groin and carried this through the external oblique.  This widely open the inguinal canal.  He was noted to have a very large direct hernia and a small cord lipoma.  I excised the cord lipoma.  I was able to dissect the direct hernia from all of the surrounding structures.  I did enter it.  There was no bowel present this was all just preperitoneal fat that was incarcerated.  I was able to reduce that its in its entirety.  I closed the sac.  I then brought the internal oblique down to the inguinal ligament with 2-0 Vicryl suture.  I then used an UltraPro mesh patch to cover the entire floor.  This  was sutured to the pubic tubercle in 2 positions with 2-0 Vicryl suture.  This was sutured every half centimeter to the inguinal ligament.  I then made a cut in the mesh and wrapped this around the spermatic cord.  I sutured this together with Vicryl suture as well.  I then laid the lateral edge flat.  This completely obliterated the defect.  Hemostasis was obtained.  I then closed the external bleak with 2-0 Vicryl suture.  Scarpa's was closed with 3-0 Vicryl.  The skin was closed with 4 Monocryl and glue.  He tolerated this well was transferred recovery stable.

## 2024-11-05 NOTE — Progress Notes (Signed)
 Mobility Specialist Progress Note:   11/05/24 1105  Mobility  Activity Ambulated with assistance (In hallway)  Level of Assistance Contact guard assist, steadying assist  Assistive Device Front wheel walker  Distance Ambulated (ft) 100 ft  Activity Response Tolerated well  Mobility Referral Yes  Mobility visit 1 Mobility  Mobility Specialist Start Time (ACUTE ONLY) 1055  Mobility Specialist Stop Time (ACUTE ONLY) 1105  Mobility Specialist Time Calculation (min) (ACUTE ONLY) 10 min   Received pt in chair and agreeable to mobility. Pt required MinG for safety. Pt c/o LLE pain, otherwise tolerated well. Returned to room without fault. Left pt in chair. RN present. All needs met.  Lavanda Pollack Mobility Specialist  Please contact via [employer] or  Rehab Office [phone]

## 2024-11-05 NOTE — Anesthesia Postprocedure Evaluation (Signed)
 Anesthesia Post Note  Patient: [name]  Procedure(s) Performed: REPAIR, HERNIA, INGUINAL, ADULT (Left: Inguinal)     Patient location during evaluation: PACU Anesthesia Type: General Level of consciousness: awake and alert Pain management: pain level controlled Vital Signs Assessment: post-procedure vital signs reviewed and stable Respiratory status: spontaneous breathing, nonlabored ventilation and respiratory function stable Cardiovascular status: blood pressure returned to baseline Postop Assessment: no apparent nausea or vomiting Anesthetic complications: no   No notable events documented.  Last Vitals:  Vitals:   11/05/24 1500 11/05/24 1515  BP: (!) 154/77 (!) 149/83  Pulse: (!) 59 60  Resp: 11 13  Temp:  36.8 C  SpO2: 99% 96%    Last Pain:  Vitals:   11/05/24 1500  TempSrc:   PainSc: Asleep                 Vertell Row

## 2024-11-05 NOTE — Progress Notes (Signed)
°   11/05/24 0848  Height and Weight  Height 5' 11 (1.803 m)  Weight 94.5 kg  BSA (Calculated - sq m) 2.18 sq meters  BMI (Calculated) 29.07  Weight in (lb) to have BMI = 25 178.9

## 2024-11-05 NOTE — Plan of Care (Signed)
   Problem: Education: Goal: Ability to describe self-care measures that may prevent or decrease complications (Diabetes Survival Skills Education) will improve Outcome: Progressing

## 2024-11-05 NOTE — Transfer of Care (Signed)
 Immediate Anesthesia Transfer of Care Note  Patient: [name]  Procedure(s) Performed: REPAIR, HERNIA, INGUINAL, ADULT (Left: Inguinal)  Patient Location: PACU  Anesthesia Type:General  Level of Consciousness: drowsy and patient cooperative  Airway & Oxygen Therapy: Patient Spontanous Breathing and Patient connected to face mask oxygen  Post-op Assessment: Report given to RN, Post -op Vital signs reviewed and stable, and Patient moving all extremities  Post vital signs: Reviewed and stable  Last Vitals:  Vitals Value Taken Time  BP 164/88 11/05/24 14:30  Temp 36.8 C 11/05/24 14:30  Pulse 66 11/05/24 14:35  Resp 13 11/05/24 14:35  SpO2 98 % 11/05/24 14:35  Vitals shown include unfiled device data.  Last Pain:  Vitals:   11/05/24 1430  TempSrc:   PainSc: Asleep      Patients Stated Pain Goal: 2 (11/04/24 2040)  Complications: No notable events documented.

## 2024-11-05 NOTE — Progress Notes (Signed)
Coreg given 

## 2024-11-05 NOTE — Anesthesia Preprocedure Evaluation (Addendum)
 Anesthesia Evaluation  Patient identified by MRN, date of birth, ID band Patient awake    Reviewed: Allergy & Precautions, NPO status , Patient's Chart, lab work & pertinent test results, reviewed documented beta blocker date and time   Airway Mallampati: II  TM Distance: >3 FB Neck ROM: Full    Dental  (+) Edentulous Upper, Edentulous Lower   Pulmonary sleep apnea (no longer using CPAP) , former smoker Quit smoking 1984, 23 pack year history    Pulmonary exam normal breath sounds clear to auscultation       Cardiovascular hypertension (144/70 preop), Pt. on medications and Pt. on home beta blockers + CAD and + Cardiac Stents (2021)  Normal cardiovascular exam(-) dysrhythmias  Rhythm:Regular Rate:Normal  Echo 01/2023  1. Left ventricular ejection fraction, by estimation, is 55%. The left  ventricle has normal function. The left ventricle has no regional wall  motion abnormalities. There is mild asymmetric left ventricular  hypertrophy of the basal-septal segment. Left  ventricular diastolic parameters are consistent with Grade I diastolic  dysfunction (impaired relaxation). There is septal-lateral dyssynchrony  secondary to LBBB.   2. Right ventricular systolic function is normal. The right ventricular  size is normal. There is normal pulmonary artery systolic pressure. The  estimated right ventricular systolic pressure is 22.2 mmHg.   3. The mitral valve is normal in structure. Trivial mitral valve  regurgitation.   4. The aortic valve is tricuspid. There is mild calcification of the  aortic valve. There is mild thickening of the aortic valve. Aortic valve  regurgitation is not visualized.   5. The inferior vena cava is normal in size with greater than 50%  respiratory variability, suggesting right atrial pressure of 3 mmHg.     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), Residual Symptoms    GI/Hepatic Neg liver ROS,GERD  Controlled and Medicated,,  Endo/Other  diabetes, Well Controlled, Type 2, Insulin  Dependent  BMI 31 A1c 7.6 FS 170 preop  Renal/GU Renal Insufficiency and CRFRenal disease (cr 2.02, baseline Cr 1.89)  negative genitourinary   Musculoskeletal  (+) Arthritis , Osteoarthritis,    Abdominal   Peds  Hematology  (+) Blood dyscrasia, anemia Hb 10, plt 153   Anesthesia Other Findings   Reproductive/Obstetrics negative OB ROS                              Anesthesia Physical Anesthesia Plan  ASA: 3  Anesthesia Plan: General and Regional   Post-op Pain Management: Tylenol  PO (pre-op)* and Regional block*   Induction: Intravenous  PONV Risk Score and Plan: 2 and Ondansetron , Dexamethasone  and Treatment may vary due to age or medical condition  Airway Management Planned: Oral ETT  Additional Equipment: None  Intra-op Plan:   Post-operative Plan: Extubation in OR  Informed Consent: I have reviewed the patients History and Physical, chart, labs and discussed the procedure including the risks, benefits and alternatives for the proposed anesthesia with the patient or authorized representative who has indicated his/her understanding and acceptance.     Dental advisory given  Plan Discussed with: CRNA  Anesthesia Plan Comments:          Anesthesia Quick Evaluation

## 2024-11-05 NOTE — Progress Notes (Signed)
 [facility]                                                                               [name], is a 73 y.o. male, DOB - 12/25/1950, FMW:[id number] Admit date - 11/02/2024    Outpatient Primary MD for the patient is Geofm, Glade PARAS, MD  LOS - 2  days    Brief summary   [name] is a 73 y.o. 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   He was also found to have left inguinal hernia with bowel which couldn't be reduced. General surgery consulted.   Assessment & Plan     Left leg pain X rays are unremarkable.  Venous duplex is negative for DVT.  Pain control.prn muscle relaxants.  Physical therapy evaluations.    Left inguinal hernia Gen surgery on board. Plan for hernia repair later today.  Pain control.     Type 2 DM CBG (last 3)  Recent Labs    11/05/24 0405 11/05/24 0834 11/05/24 1110  GLUCAP 156* 120* 170*   Resume SSI.  A1c is 7.6% No changes    Anemia of chronic disease Hemoglobin stable between 9 to 10.    Stage 3b CKD Creatinine at baseline.  Creatinine between 1.8 to 2.    Hypertension Well controlled.  Resume coreg .    Hyperlipidemia Resume statin.   Mild elevated in bilirubin.  Total bili is 1.3     Lobulated cystic lesion within the uncinate process of the pancreas, mildly enlarged since prior examination, indeterminate; recommend pancreas-protocol MRI for further characterization as outpatient.  Discussed with patient.   Estimated body mass index is 29.29 kg/m as calculated from the following:   Height as of this encounter: 5' 11 (1.803 m).   Weight as of this encounter: 95.3 kg.  Code Status: full code.  DVT Prophylaxis:     Level of Care: Level of care: Telemetry Family Communication: none at bedside.    Disposition Plan:     Remains inpatient appropriate:  further eval for hernia repair.    Procedures:  Hernia repair next week.   Consultants:   General surgery  Antimicrobials:   Anti-infectives (From admission, onward)    Start     Dose/Rate Route Frequency Ordered Stop   11/05/24 0930  [MAR Hold]  ciprofloxacin  (CIPRO ) IVPB 400 mg        (MAR Hold since Mon 11/05/2024 at 1134.Hold Reason: Transfer to a Procedural area)   400 mg 200 mL/hr over 60 Minutes Intravenous To [facility] 11/05/24 0904 11/06/24 0930        Medications  Scheduled Meds:  acetaminophen   1,000 mg Oral Once   [MAR Hold] atorvastatin   40 mg Oral QHS   [MAR Hold] carvedilol   25 mg Oral BID   chlorhexidine   15 mL Mouth/Throat Once   Or   mouth rinse  15 mL Mouth Rinse Once   chlorhexidine        [MAR Hold] feeding supplement  1  Container Oral TID BM   [MAR Hold] insulin  aspart  0-9 Units Subcutaneous Q4H   [MAR Hold] pantoprazole   40 mg Oral Q0600   [MAR Hold] sodium chloride  flush  3 mL Intravenous Q12H   Continuous Infusions:  sodium chloride      [MAR Hold] ciprofloxacin      PRN Meds:.[MAR Hold] acetaminophen  **OR** [MAR Hold] acetaminophen , [MAR Hold] alum & mag hydroxide-simeth, chlorhexidine , [MAR Hold] cyclobenzaprine , [MAR Hold] fentaNYL  (SUBLIMAZE ) injection, [MAR Hold] fluticasone , [MAR Hold] HYDROcodone -acetaminophen     Subjective:   [name] was seen and examined today. No new complaints.   Objective:   Vitals:   11/05/24 0500 11/05/24 0848 11/05/24 1034 11/05/24 1131  BP:   (!) 148/80 (!) 144/70  Pulse:   62 62  Resp:   17 17  Temp:   98.3 F (36.8 C) 98 F (36.7 C)  TempSrc:   Oral Oral  SpO2:   100% 100%  Weight: 101.9 kg 94.5 kg  95.3 kg  Height:  5' 11 (1.803 m)  5' 11 (1.803 m)    Intake/Output Summary (Last 24 hours) at 11/05/2024 1140 Last data filed at 11/05/2024 0737 Gross per 24 hour  Intake 460 ml  Output 975 ml  Net -515 ml   Filed  Weights   11/05/24 0500 11/05/24 0848 11/05/24 1131  Weight: 101.9 kg 94.5 kg 95.3 kg     Exam General exam: Appears calm and comfortable  Respiratory system: Clear to auscultation. Respiratory effort normal. Cardiovascular system: S1 & S2 heard, RRR. Gastrointestinal system: Abdomen is nondistended, soft and nontender.  Central nervous system: Alert and oriented.  Extremities: Symmetric 5 x 5 power. Skin: No rashes,  Psychiatry:  Mood & affect appropriate.      Data Reviewed:  I have personally reviewed following labs and imaging studies   CBC Lab Results  Component Value Date   WBC 7.5 11/05/2024   RBC 3.13 (L) 11/05/2024   HGB 10.0 (L) 11/05/2024   HCT 29.8 (L) 11/05/2024   MCV 95.2 11/05/2024   MCH 31.9 11/05/2024   PLT 153 11/05/2024   MCHC 33.6 11/05/2024   RDW 12.5 11/05/2024   LYMPHSABS 2.2 11/02/2024   MONOABS 0.9 11/02/2024   EOSABS 0.3 11/02/2024   BASOSABS 0.0 11/02/2024     Last metabolic panel Lab Results  Component Value Date   NA 139 11/05/2024   K 4.3 11/05/2024   CL 106 11/05/2024   CO2 28 11/05/2024   BUN 22 11/05/2024   CREATININE 2.02 (H) 11/05/2024   GLUCOSE 146 (H) 11/05/2024   GFRNONAA 34 (L) 11/05/2024   GFRAA 32 (L) 09/10/2020   CALCIUM  8.7 (L) 11/05/2024   PHOS 3.6 08/16/2012   PROT 6.5 11/05/2024   ALBUMIN 3.2 (L) 11/05/2024   LABGLOB 3.1 12/24/2022   AGRATIO 1.3 12/24/2022   BILITOT 1.3 (H) 11/05/2024   ALKPHOS 58 11/05/2024   AST 20 11/05/2024   ALT 17 11/05/2024   ANIONGAP 5 11/05/2024    CBG (last 3)  Recent Labs    11/05/24 0405 11/05/24 0834 11/05/24 1110  GLUCAP 156* 120* 170*      Coagulation Profile: No results for input(s): INR, PROTIME in the last 168 hours.   Radiology Studies: No results found.      Elgie Butter M.D. [facility] 11/05/2024, 11:40 AM  Available via Epic secure chat 7am-7pm After 7 pm, please refer to night coverage provider listed on amion.

## 2024-11-05 NOTE — Progress Notes (Signed)
Pt transported to short stay via bed by transportation staff 

## 2024-11-05 NOTE — Anesthesia Procedure Notes (Signed)
 Procedure Name: Intubation Date/Time: 11/05/2024 1:13 PM  Performed by: Lylia Schuyler BROCKS, CRNAPre-anesthesia Checklist: Patient identified, Emergency Drugs available, Suction available and Patient being monitored Patient Re-evaluated:Patient Re-evaluated prior to induction Oxygen Delivery Method: Circle system utilized Preoxygenation: Pre-oxygenation with 100% oxygen Induction Type: IV induction Ventilation: Mask ventilation without difficulty Laryngoscope Size: Mac, 3 and 4 Grade View: Grade II Tube type: Oral Tube size: 7.5 mm Number of attempts: 1 Airway Equipment and Method: Stylet and Oral airway Placement Confirmation: ETT inserted through vocal cords under direct vision, positive ETCO2 and breath sounds checked- equal and bilateral Secured at: 23 cm Tube secured with: Tape Dental Injury: Teeth and Oropharynx as per pre-operative assessment

## 2024-11-05 NOTE — Anesthesia Procedure Notes (Signed)
 Anesthesia Regional Block: TAP block   Pre-Anesthetic Checklist: , timeout performed,  Correct Patient, Correct Site, Correct Laterality,  Correct Procedure, Correct Position, site marked,  Risks and benefits discussed,  Surgical consent,  Pre-op evaluation,  At surgeon's request and post-op pain management  Laterality: Left  Prep: Maximum Sterile Barrier Precautions used, chloraprep       Needles:  Injection technique: Single-shot  Needle Type: Echogenic Stimulator Needle     Needle Length: 9cm  Needle Gauge: 22     Additional Needles:   Procedures:,,,, ultrasound used (permanent image in chart),,    Narrative:  Start time: 11/05/2024 1:10 PM End time: 11/05/2024 1:15 PM Injection made incrementally with aspirations every 5 mL.  Performed by: Personally  Anesthesiologist: Merla Almarie HERO, DO  Additional Notes: Monitors applied. No increased pain on injection. No increased resistance to injection. Injection made in 5cc increments. Good needle visualization. Patient tolerated procedure well.

## 2024-11-05 NOTE — Progress Notes (Addendum)
 * Day of Surgery *   Subjective/Chief Complaint: Left groin pain    Objective: Vital signs in last 24 hours: Temp:  [97.9 F (36.6 C)-98.4 F (36.9 C)] 98.2 F (36.8 C) (12/15 0410) Pulse Rate:  [58-63] 62 (12/15 0410) Resp:  [16] 16 (12/15 0410) BP: (124-152)/(62-74) 124/62 (12/15 0410) SpO2:  [100 %] 100 % (12/15 0410) Weight:  [94.5 kg-101.9 kg] 94.5 kg (12/15 0848) Last BM Date : 11/02/24  Intake/Output from previous day: 12/14 0701 - 12/15 0700 In: 680 [P.O.:680] Out: 975 [Urine:975] Intake/Output this shift: Total I/O In: -  Out: 250 [Urine:250]  Small tender LIH, no RIH noted Ab soft nontender  Lab Results:  Recent Labs    11/04/24 0143 11/05/24 0452  WBC 6.7 7.5  HGB 10.7* 10.0*  HCT 32.5* 29.8*  PLT 165 153   BMET Recent Labs    11/04/24 0143 11/05/24 0452  NA 142 139  K 4.2 4.3  CL 104 106  CO2 27 28  GLUCOSE 138* 146*  BUN 26* 22  CREATININE 1.89* 2.02*  CALCIUM  9.4 8.7*   PT/INR No results for input(s): LABPROT, INR in the last 72 hours. ABG No results for input(s): PHART, HCO3 in the last 72 hours.  Invalid input(s): PCO2, PO2  Studies/Results: VAS US  LOWER EXTREMITY VENOUS (DVT) Result Date: 11/03/2024  Lower Venous DVT Study Patient Name:  [name]  Date of Exam:   11/03/2024 Medical Rec #: [id number]           Accession #:    [id number] Date of Birth: Jan 14, 1951          Patient Gender: M Patient Age:   73 years Exam Location:  [facility] Procedure:      VAS US  LOWER EXTREMITY VENOUS (DVT) Referring Phys: [name] --------------------------------------------------------------------------------  Indications: Left lateral thigh pain.  Comparison Study: No previous exams Performing Technologist: [name] RVT, RDMS  Examination Guidelines: A complete evaluation includes B-mode imaging, spectral Doppler, color Doppler, and power Doppler as needed of all accessible portions of each vessel. Bilateral testing is  considered an integral part of a complete examination. Limited examinations for reoccurring indications may be performed as noted. The reflux portion of the exam is performed with the patient in reverse Trendelenburg.  +-----+---------------+---------+-----------+----------+--------------+ RIGHTCompressibilityPhasicitySpontaneityPropertiesThrombus Aging +-----+---------------+---------+-----------+----------+--------------+ CFV  Full           Yes      Yes                                 +-----+---------------+---------+-----------+----------+--------------+   +---------+---------------+---------+-----------+----------+--------------+ LEFT     CompressibilityPhasicitySpontaneityPropertiesThrombus Aging +---------+---------------+---------+-----------+----------+--------------+ CFV      Full           Yes      Yes                                 +---------+---------------+---------+-----------+----------+--------------+ SFJ      Full                                                        +---------+---------------+---------+-----------+----------+--------------+ FV Prox  Full           Yes      Yes                                 +---------+---------------+---------+-----------+----------+--------------+  FV Mid   Full           Yes      Yes                                 +---------+---------------+---------+-----------+----------+--------------+ FV DistalFull           Yes      Yes                                 +---------+---------------+---------+-----------+----------+--------------+ PFV      Full                                                        +---------+---------------+---------+-----------+----------+--------------+ POP      Full           Yes      Yes                                 +---------+---------------+---------+-----------+----------+--------------+ PTV      Full                                                         +---------+---------------+---------+-----------+----------+--------------+ PERO     Full                                                        +---------+---------------+---------+-----------+----------+--------------+     Summary: RIGHT: - No evidence of common femoral vein obstruction.  - There is no evidence of deep vein thrombosis in the lower extremity.  LEFT: - There is no evidence of deep vein thrombosis in the lower extremity.  *See table(s) above for measurements and observations. Electronically signed by Mike New MD on 11/03/2024 at 6:07:52 PM.    Final     Anti-infectives: Anti-infectives (From admission, onward)    None       Assessment/Plan: Left inguinal hernia containing small bowel originally presented for fall Fat containing right inguinal hernia  -plan for open repair LIH today due to symptoms. Right side is not clnically apparent so will just follow that ct found issue -We discussed observation versus repair.  We discussed both laparoscopic and open inguinal hernia repairs. I described the procedure in detail.  The patient was given educational material.  Goals should be achieved with surgery. We discussed the usage of mesh and the rationale behind that. We went over the pathophysiology of inguinal hernia. We have elected to perform open inguinal hernia repair with mesh.  We discussed the risks including bleeding, infection, recurrence, postoperative pain and chronic groin pain, testicular injury, urinary retention, numbness in groin and around incision.  FEN - NPO VTE - SCD's, Heparin  injection (Plavix  held) ID - No abx indicated from a surgical standpoint   Insulin  dependent diabetes with complications  CAD PMH CVA CKD  Diabetic retinopathy Left leg pain - venous duplex neg for DVT.   [name] 11/05/2024

## 2024-11-06 ENCOUNTER — Encounter (HOSPITAL_COMMUNITY): Payer: Self-pay | Admitting: General Surgery

## 2024-11-06 ENCOUNTER — Other Ambulatory Visit (HOSPITAL_COMMUNITY): Payer: Self-pay

## 2024-11-06 LAB — GLUCOSE, CAPILLARY
Glucose-Capillary: 186 mg/dL — ABNORMAL HIGH (ref 70–99)
Glucose-Capillary: 247 mg/dL — ABNORMAL HIGH (ref 70–99)
Glucose-Capillary: 250 mg/dL — ABNORMAL HIGH (ref 70–99)
Glucose-Capillary: 269 mg/dL — ABNORMAL HIGH (ref 70–99)
Glucose-Capillary: 354 mg/dL — ABNORMAL HIGH (ref 70–99)

## 2024-11-06 MED ORDER — PANTOPRAZOLE SODIUM 40 MG PO TBEC
40.0000 mg | DELAYED_RELEASE_TABLET | Freq: Every day | ORAL | 0 refills | Status: AC
  Filled 2024-11-06: qty 30, 30d supply, fill #0

## 2024-11-06 MED ORDER — ACETAMINOPHEN 500 MG PO TABS: 500.0000 mg | ORAL_TABLET | Freq: Four times a day (QID) | ORAL | Status: AC | PRN

## 2024-11-06 MED ORDER — OXYCODONE HCL 5 MG PO TABS
5.0000 mg | ORAL_TABLET | Freq: Four times a day (QID) | ORAL | 0 refills | Status: DC | PRN
  Filled 2024-11-06: qty 20, 5d supply, fill #0

## 2024-11-06 NOTE — Progress Notes (Signed)
°   11/06/24 0838  Vitals  BP 126/62  BP Location Right Arm  BP Method Automatic  Patient Position (if appropriate) Lying  Pulse Rate 60  Pulse Rate Source Dinamap  Resp 17  Level of Consciousness  Level of Consciousness Alert  MEWS COLOR  MEWS Score Color Green  Oxygen Therapy  SpO2 100 %  O2 Device Room Air  Pain Assessment  Pain Scale 0-10  Pain Score 0  MEWS Score  MEWS Temp 0  MEWS Systolic 0  MEWS Pulse 0  MEWS RR 0  MEWS LOC 0  MEWS Score 0

## 2024-11-06 NOTE — Inpatient Diabetes Management (Signed)
 Inpatient Diabetes Program Recommendations  AACE/ADA: New Consensus Statement on Inpatient Glycemic Control (2015)  Target Ranges:  Prepandial:   less than 140 mg/dL      Peak postprandial:   less than 180 mg/dL (1-2 hours)      Critically ill patients:  140 - 180 mg/dL   Lab Results  Component Value Date   GLUCAP 354 (H) 11/06/2024   HGBA1C 7.6 (H) 11/03/2024    Review of Glycemic Control  Latest Reference Range & Units 11/06/24 03:56 11/06/24 07:59 11/06/24 12:02  Glucose-Capillary 70 - 99 mg/dL 752 (H) 730 (H) 645 (H)   Diabetes history: DM 2 Outpatient Diabetes medications:  Lantus  7 units q HS Humalog  6-10 units bid with meals  Current orders for Inpatient glycemic control:  Novolog  0-9 units q 4 hours  Inpatient Diabetes Program Recommendations:    Please consider adding Lantus  8 units daily.  Also may consider changing Novolog  correction to tid with meals and HS and add Novolog  meal coverage 3 units tid with meals (hold if patient eats less than 50% or NPO).   Thanks,  Randall Bullocks, RN, BC-ADM Inpatient Diabetes Coordinator Pager [phone]  (8a-5p)

## 2024-11-06 NOTE — Progress Notes (Signed)
 Physical Therapy Treatment Patient Details Name: [name] MRN: [id number] DOB: Dec 06, 1950 Today's Date: 11/06/2024   History of Present Illness 73 y.o. male presents to [facility] hospital on 11/02/2024 after a fall at home. CT shows incarcerated L inguinal hernia, imaging is otherwise unremarkable. 12/15 pt s/p Left inguinal hernia repair with UltraPro mesh patch. PMH includes CAD, CKD, GERD, HLD, HTN, LBBB, peripheral neuropathy, TIA.    PT Comments  Pt received in chair, agreeable to therapy session and with good participation and improved tolerance for transfer and gait and threshold step training with RW support. Pt needing up to light minA to bring legs back into bed, discussed benefits of log roll and pt practiced from both L and R sides of bed. Pt otherwise CGA to Supervision for transfers and gait, and needing up to minA for stepping up/down 3 step in room due to need for support to move RW up/down from step, no overt buckling today. Pt continues to benefit from PT services to progress toward functional mobility goals, case mgmt and MD notified pt remains agreeable to OP PT upon DC, and still requests RW for home since he only has a cane. Pt progress discussed with supervising PT [name], goals remain appropriate post-op. Gait belt given to pt to take home.   If plan is discharge home, recommend the following: A little help with walking and/or transfers;A little help with bathing/dressing/bathroom;Assistance with cooking/housework;Assist for transportation;Help with stairs or ramp for entrance   Can travel by private vehicle        Equipment Recommendations  Rolling walker (2 wheels)    Recommendations for Other Services       Precautions / Restrictions Precautions Precautions: Fall Recall of Precautions/Restrictions: Intact Precaution/Restrictions Comments: L inguinal incision Restrictions Weight Bearing Restrictions Per Provider Order: No     Mobility  Bed  Mobility Overal bed mobility: Needs Assistance Bed Mobility: Rolling, Sidelying to Sit, Sit to Sidelying Rolling: Supervision Sidelying to sit: Contact guard assist     Sit to sidelying: Min assist General bed mobility comments: From L EOB pt performs without rail from flat bed, pt performs sit>supine and supine to sit with CGA, then from opposite side of bed and no rail, pt performs sit>supine with minA due to needing BLE light lift assist over EOB. Cues for guarding/safety with new inguinal incision and log roll to decrease lower back discomfort as well, pt receptive.    Transfers Overall transfer level: Needs assistance Equipment used: Rolling walker (2 wheels) Transfers: Sit to/from Stand Sit to Stand: Contact guard assist, Supervision           General transfer comment: from chair wtih Supervision, min cues for safety, and to/from EOB with CGA to supervision, light CGA mostly due to decreased eccentric control to lower surface height.    Ambulation/Gait Ambulation/Gait assistance: Supervision, Contact guard assist Gait Distance (Feet): 100 Feet (164ft, seated break, 85ft) Assistive device: Rolling walker (2 wheels) Gait Pattern/deviations: Step-to pattern, Decreased stance time - left, Decreased dorsiflexion - left, Decreased weight shift to left, Antalgic, Step-through pattern, Knee flexed in stance - left Gait velocity: dec     General Gait Details: Educated on safe AD use with RW for support. Cues for proximity to device to maximize support and unload LLE as needed. No overt buckling with adequate support from UEs. Pt maintains LUE grip. Step to pattern with cues to lead with LLE. Intermittent reminders to step closer within RW, esp prior to turning, and to relax shoulders.  Stairs Stairs: Yes Stairs assistance: Contact guard assist, Min assist Stair Management: Step to pattern, Forwards, With walker Number of Stairs: 2 General stair comments: 3 soft platform in room  x2 trials, cues for sequencing/safety and pt needing minA for stability when lifting RW up on to folded mat (used to simulate curb step) and when lifting RW forward down off mat prior to stepping down. Family present in room and instructed on using gait belt prior to pt performing transfers/stairs as they may be assisting him into his home later today, family receptive.   Wheelchair Mobility     Tilt Bed    Modified Rankin (Stroke Patients Only)       Balance Overall balance assessment: Needs assistance Sitting-balance support: No upper extremity supported, Feet supported Sitting balance-Leahy Scale: Good     Standing balance support: Bilateral upper extremity supported, Reliant on assistive device for balance Standing balance-Leahy Scale: Poor Standing balance comment: Reliant on device for support.                            Communication Communication Communication: No apparent difficulties  Cognition Arousal: Alert Behavior During Therapy: WFL for tasks assessed/performed   PT - Cognitive impairments: No apparent impairments                       PT - Cognition Comments: Pt alert and cooperative, eager to go home if able, reporting that he discussed disposition with family who have previously had HHPT and they report they were frustrated that [facility] sessions were very time limited, pt/family agreeable to him getting OPPT and report they can give him rides to his OP appointments after DC. Case mgr/SW and MD notified of discussion. Following commands: Intact      Cueing Cueing Techniques: Verbal cues, Gestural cues  Exercises General Exercises - Lower Extremity Ankle Circles/Pumps: AROM, Supine, Both (a few reps for teachback)    General Comments        Pertinent Vitals/Pain Pain Assessment Pain Assessment: Faces Faces Pain Scale: Hurts little more Pain Location: L buttock and lower back and L inguinal hernia incision site Pain Descriptors /  Indicators: Grimacing, Guarding, Discomfort, Sore, Tightness Pain Intervention(s): Monitored during session, Repositioned    Home Living                          Prior Function            PT Goals (current goals can now be found in the care plan section) Acute Rehab PT Goals Patient Stated Goal: Reduce pain, and to return home PT Goal Formulation: With patient Time For Goal Achievement: 11/17/24 Progress towards PT goals: Progressing toward goals    Frequency    Min 2X/week      PT Plan      Co-evaluation              AM-PAC PT 6 Clicks Mobility   Outcome Measure  Help needed turning from your back to your side while in a flat bed without using bedrails?: A Little Help needed moving from lying on your back to sitting on the side of a flat bed without using bedrails?: A Little Help needed moving to and from a bed to a chair (including a wheelchair)?: A Little Help needed standing up from a chair using your arms (e.g., wheelchair or bedside chair)?: A Little Help needed to  walk in hospital room?: A Little Help needed climbing 3-5 steps with a railing? : A Little 6 Click Score: 18    End of Session Equipment Utilized During Treatment: Gait belt Activity Tolerance: Patient tolerated treatment well Patient left: in bed;with call bell/phone within reach;with family/visitor present (PTA unable to set bed alarm due to both bed touch screens non-functional, RN notified a new bed needs to be ordered for 6N30 when he DC later today since bed alarm not working) Nurse Communication: Mobility status;Other (comment) (pt agreeable to DC today with RW and OPPT) PT Visit Diagnosis: Unsteadiness on feet (R26.81);Other abnormalities of gait and mobility (R26.89);History of falling (Z91.81);Difficulty in walking, not elsewhere classified (R26.2);Other symptoms and signs involving the nervous system (R29.898);Hemiplegia and hemiparesis Hemiplegia - Right/Left:  Left Hemiplegia - caused by:  (hx L hemi due to prior CVA)     Time: 8475-8452 PT Time Calculation (min) (ACUTE ONLY): 23 min  Charges:    $Gait Training: 8-22 mins $Therapeutic Activity: 8-22 mins PT General Charges $$ ACUTE PT VISIT: 1 Visit                     Mike Gonzaga P., PTA Acute Rehabilitation Services Secure Chat Preferred 9a-5:30pm Office: [phone]    [name] 11/06/2024, 4:10 PM

## 2024-11-06 NOTE — Progress Notes (Signed)
°   11/06/24 0500  Height and Weight  Weight 95.5 kg  BMI (Calculated) 29.38

## 2024-11-06 NOTE — Progress Notes (Signed)
 Discharge instructions given to pt. Pt verbalized understanding of all teaching and had no further questions. Pt currently in room waiting for walker to be delivered

## 2024-11-06 NOTE — Progress Notes (Signed)
 Patient had been waiting for walker to be delivered., his ride had to go to work and could not wait any longer for walker. Charge RN called ED Social worker, administrator, arts., and house supervisor. Per house supervisor, patient can leave with hospital walker, and we can can keep his when its delivered.

## 2024-11-06 NOTE — Progress Notes (Signed)
 Discharge meds (protonix  and oxycodone ) picked up from pharmacy and given to pt at discharge.

## 2024-11-06 NOTE — Progress Notes (Signed)
 Mobility Specialist Progress Note:    11/06/24 0936  Mobility  Activity Ambulated with assistance (In hallway)  Level of Assistance Contact guard assist, steadying assist  Assistive Device Front wheel walker  Distance Ambulated (ft) 140 ft  Activity Response Tolerated well  Mobility Referral Yes  Mobility visit 1 Mobility  Mobility Specialist Start Time (ACUTE ONLY) 0915  Mobility Specialist Stop Time (ACUTE ONLY) 0936  Mobility Specialist Time Calculation (min) (ACUTE ONLY) 21 min   Received pt in bed and eager for mobility. Pt required MinG for safety. Pt c/o LLE pain, otherwise tolerated well. Returned to room without fault. Left pt EOB w/ son present. Personal belongings and call light within reach. All needs met.  Lavanda Pollack Mobility Specialist  Please contact via Secure Chat or  Rehab Office [phone]

## 2024-11-06 NOTE — TOC Initial Note (Addendum)
 Transition of Care (TOC) - Initial/Assessment Note   Spoke to patient and son at bedside.   Patient from home with daughter and young grandchildren .   Patient has a cane at home and has done OP PT at [facility] when he had his stroke.   Discussed PTA is going to come back and work with him today . Prior to surgery PT recommendations were Rolling Walker and OP PT.   Patient feels that he will still need rolling walker and PT but asking if he can start with HHPT due to transportation. NCM sent MD and PTA secure chat. MD agreed.   NCM ordered rolling walker with Jermaine with [employer] and sent referral for HHPT to [place] with [facility]    1300 MD reported patient now wants to go to SNF for short term rehab. Discussed with patient and three family members at bedside. Patient reports PT was going to recommend SNF but patient requested home health. Family at bedside states that his daughter will not be able to assist him at discharge, and there is no one else who can.   PT note not entered yet. NCM secure chatted PT, MD and SW. NCM explained to patient and family that PT would need to agree he needs SNF and change recommendation. If so then SW would fax FL2 out to SNF's and provide patient with offers for him to pick one. Then clinicals would be submitted to his insurance to see if insurance would approve SNF. Insurance will look at recommendation and how patient did with PT .   Patient and family voiced understanding .   NCM cancelled walker from [employer]. If patient goes to SNF at discharge from SNF if walker needed SNF would order. They voiced understanding.   Burnard with [facility] updated   Patient was given observation letter on weekend. He and family had questions. NCM explained observation letter again . They voiced understanding    PTA worked with patient , now patient requesting to go home and do OP PT at [facility] and needs rolling walker. NCM went to room and discussed with patinet,  sister in law and cousin and confirm this is now what he wants. NCM updated Burnard with [facility] , called Jermaine with [employer] for walker and asked MD for order for OP PT at [facility] and to cancel [facility] order  Patient Details  Name: [name] MRN: [id number] Date of Birth: 1951-01-25  Transition of Care [facility]) CM/SW Contact:    Stephane Powell Jansky, RN Phone Number: 11/06/2024, 9:08 AM  Clinical Narrative:                   Expected Discharge Plan: Home w Home Health Services Barriers to Discharge: Continued Medical Work up   Patient Goals and CMS Choice Patient states their goals for this hospitalization and ongoing recovery are:: to return to home CMS Medicare.gov Compare Post Acute Care list provided to:: Patient Choice offered to / list presented to : Patient      Expected Discharge Plan and Services   Discharge Planning Services: CM Consult Post Acute Care Choice: Home Health, Durable Medical Equipment Living arrangements for the past 2 months: Single Family Home                 DME Arranged: Walker rolling DME Agency: [employer] Date DME Agency Contacted: 11/06/24 Time DME Agency Contacted: [phone] Representative spoke with at DME Agency: [place] HH Arranged: PT HH Agency: [facility] Date [facility] Agency  Contacted: 11/06/24 Time HH Agency Contacted: [id number] Representative spoke with at [facility] Agency: Burnard  Prior Living Arrangements/Services Living arrangements for the past 2 months: Single Family Home Lives with:: Adult Children Patient language and need for interpreter reviewed:: Yes Do you feel safe going back to the place where you live?: Yes      Need for Family Participation in Patient Care: Yes (Comment) Care giver support system in place?: Yes (comment) Current home services: DME Criminal Activity/Legal Involvement Pertinent to Current Situation/Hospitalization: No - Comment as needed  Activities of Daily Living   ADL Screening  (condition at time of admission) Independently performs ADLs?: Yes (appropriate for developmental age) Is the patient deaf or have difficulty hearing?: No Does the patient have difficulty seeing, even when wearing glasses/contacts?: No Does the patient have difficulty concentrating, remembering, or making decisions?: No  Permission Sought/Granted   Permission granted to share information with : Yes, Verbal Permission Granted     Permission granted to share info w AGENCY: [facility], [facility]        Emotional Assessment Appearance:: Appears stated age Attitude/Demeanor/Rapport: Engaged Affect (typically observed): Appropriate Orientation: : Oriented to Self, Oriented to Place, Oriented to  Time, Oriented to Situation Alcohol / Substance Use: Not Applicable Psych Involvement: No (comment)  Admission diagnosis:  Left inguinal hernia [K40.90] Left thigh pain [M79.652] Fall, initial encounter [W19.XXXA] Patient Active Problem List   Diagnosis Date Noted   Left thigh pain 11/02/2024   Rash and other nonspecific skin eruption 01/18/2024   Right-sided face pain 01/18/2024   Conjunctivitis 12/16/2023   Near syncope 12/15/2023   Dehydration 12/15/2023   Plantar fasciitis of left foot 11/09/2023   Pancreatic mass -MRI for follow-up due 06/2024 07/19/2023   Calculus of gallbladder without cholecystitis without obstruction 07/19/2023   Constipation 07/19/2023   Intractable nausea and vomiting 07/14/2023   Epigastric pain 07/14/2023   Transaminitis 07/14/2023   Back pain 03/04/2023   Diarrhea 06/09/2022   Ganglion cyst of wrist, left 12/14/2021   Urinary frequency 11/06/2021   Hoarse 03/25/2021   Moderate episode of recurrent major depressive disorder (HCC) 03/25/2021   Major depressive disorder with single episode 03/25/2021   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   Vocal cord paralysis 03/25/2021   Diabetic peripheral neuropathy (HCC) 12/25/2020   Orthopnea 12/16/2020   OSA on CPAP  09/09/2020   Sciatica of left side 07/11/2019   Cough 07/11/2019   Edema leg 10/26/2018   History of stroke 10/26/2018   DOE (dyspnea on exertion) 10/26/2018   Left hip pain 07/14/2018   Acute left ankle pain 07/14/2018   Chest pain 07/14/2018   Nonintractable headache 02/24/2018   CKD stage 3b, GFR 30-44 ml/min (HCC) 02/22/2017   Anemia of chronic disease 02/16/2017   Chronic diastolic CHF (congestive heart failure) (HCC) 06/29/2016   LBBB (left bundle branch block) 02/02/2016   Nonallopathic lesion of lumbosacral region 12/11/2014   Nonallopathic lesion of sacral region 12/11/2014   Nonallopathic lesion of thoracic region 12/11/2014   Arthritis of left hip 11/20/2014   Ischial bursitis of left side 10/28/2014   Hamstring tightness of left lower extremity 09/16/2014   Piriformis syndrome of left side 09/16/2014   Dysphonia 06/22/2013   Hemiparesis affecting left side as late effect of stroke (HCC) 10/10/2012   Dysphagia following cerebrovascular accident 08/14/2012   Chronic back pain    Peripheral neuropathy (HCC)    TIA (transient ischemic attack) 04/08/2012   Stroke (HCC) 02/21/2012   MORTON'S NEUROMA, RIGHT 05/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 [place] [address] Phone: [phone] Fax: [phone]  [facility] [address] [place] [address] Phone: [phone] Fax: [phone]     Social  Drivers of Health (SDOH) Social History: SDOH Screenings   Food Insecurity: No Food Insecurity (11/02/2024)  Housing: Low Risk (11/02/2024)  Transportation Needs: No Transportation Needs (11/02/2024)  Utilities: Not At Risk (11/02/2024)  Alcohol Screen: Low Risk (03/05/2022)  Depression (PHQ2-9): Low Risk (10/01/2024)  Financial Resource Strain: Low Risk (09/28/2023)  Physical Activity: Insufficiently Active (10/01/2024)  Social Connections: Socially Isolated (11/02/2024)  Stress: No Stress Concern Present (10/01/2024)  Tobacco Use: Medium Risk (11/05/2024)  Health Literacy: Adequate Health Literacy (10/01/2024)   SDOH Interventions:     Readmission Risk Interventions     No data to display

## 2024-11-06 NOTE — TOC CM/SW Note (Signed)
 Awaiting post op PT evaluation

## 2024-11-06 NOTE — Discharge Summary (Incomplete)
 Physician Discharge Summary   Patient: [name] MRN: [id number] DOB: 1951/07/04  Admit date:     11/02/2024  Discharge date: {dischdate:26783}  Discharge Physician: Elgie Butter   PCP: Geofm Glade PARAS, MD   Recommendations at discharge:  {Tip this will not be part of the note when signed- Example include specific recommendations for outpatient follow-up, pending tests to follow-up on. (Optional):26781}  ***  Discharge Diagnoses: Principal Problem:   Left thigh pain  Resolved Problems:   * No resolved hospital problems. [facility] Course:      Assessment and Plan: No notes have been filed under this hospital service. Service: Hospitalist     {Tip this will not be part of the note when signed Body mass index is 29.36 kg/m. , ,  (Optional):26781}  {(NOTE) Pain control PDMP Statment (Optional):26782} Consultants: *** Procedures performed: ***  Disposition: {Plan; Disposition:26390} Diet recommendation:  {Diet_Plan:26776} DISCHARGE MEDICATION: Allergies as of 11/06/2024       Reactions   Penicillins Anaphylaxis   Pneumococcal Vaccines Anaphylaxis   Shellfish Allergy Anaphylaxis   Barbiturates    Gabapentin     Urinary retention   Influenza Vaccines    Lisinopril  Cough   Other Nausea And Vomiting   Pt wife states that dye that is used for eye exam in dr    Sildenafil Other (See Comments), Nausea And Vomiting   Other reaction(s): Headache, Dizziness   Topiramate Other (See Comments)   Chest spasms and numbness   Zofran  [ondansetron ]    Amlodipine  Other (See Comments)   LE edema   Metformin  Nausea And Vomiting   Tadalafil Other (See Comments)   dizziness   Vardenafil Other (See Comments)   headaches        Medication List     PAUSE taking these medications    losartan  100 MG tablet Wait to take this until your doctor or other care provider tells you to start again. Commonly known as: COZAAR  TAKE 1 TABLET EVERY DAY       STOP  taking these medications    chlorthalidone  25 MG tablet Commonly known as: HYGROTON        TAKE these medications    Accu-Chek Aviva Plus test strip Generic drug: glucose blood Use as directed to check blood sugar 3 times daily (uad tid)   acetaminophen  500 MG tablet Commonly known as: TYLENOL  Take 1 tablet (500 mg total) by mouth every 6 (six) hours as needed for mild pain (pain score 1-3).   aspirin  EC 81 MG tablet Take 1 tablet (81 mg total) by mouth daily. Swallow whole.   atorvastatin  40 MG tablet Commonly known as: LIPITOR TAKE 1 TABLET EVERY DAY   Blood Pressure Cuff Misc 1 Units by Does not apply route daily.   budesonide -formoterol  80-4.5 MCG/ACT inhaler Commonly known as: Symbicort  2 puffs BID and prn shortness of breath or wheezing   calcium  carbonate 500 MG chewable tablet Commonly known as: TUMS - dosed in mg elemental calcium  Chew 1 tablet by mouth daily as needed for indigestion or heartburn.   carvedilol  25 MG tablet Commonly known as: COREG  TAKE 1 TABLET TWICE DAILY   clopidogrel  75 MG tablet Commonly known as: PLAVIX  TAKE 1 TABLET EVERY DAY   fluticasone  50 MCG/ACT nasal spray Commonly known as: FLONASE  Place 2 sprays into both nostrils daily. What changed:  when to take this reasons to take this   furosemide  40 MG tablet Commonly known as: LASIX  TAKE 1 TABLET (40 MG TOTAL) BY MOUTH  DAILY. NEED APPT.   Gluco to Go 15 40 % Gel Generic drug: dextrose  Take 1 Tube by mouth once as needed for low blood sugar.   insulin  lispro 100 UNIT/ML KwikPen Commonly known as: HumaLOG  KwikPen Inject 6-10 Units into the skin 2 (two) times daily before a meal.   Insulin  Pen Needle 32G X 4 MM Misc Use 4x a day   Lantus  SoloStar 100 UNIT/ML Solostar Pen Generic drug: insulin  glargine Inject 7-8 Units into the skin daily. What changed:  how much to take when to take this   Menthol  (Topical Analgesic) 4 % Gel Apply 1 application  topically daily as  needed.   Microlet Lancets Misc UAD to check sugars TID.  E11.22   nitroGLYCERIN  0.4 MG SL tablet Commonly known as: Nitrostat  Place 1 tablet (0.4 mg total) under the tongue every 5 (five) minutes as needed for chest pain (up to 3 doses).   oxyCODONE  5 MG immediate release tablet Commonly known as: Oxy IR/ROXICODONE  Take 1 tablet (5 mg total) by mouth every 6 (six) hours as needed for moderate pain (pain score 4-6) or severe pain (pain score 7-10).   pantoprazole  40 MG tablet Commonly known as: PROTONIX  Take 1 tablet (40 mg total) by mouth daily at 6 (six) AM. Start taking on: November 07, 2024   senna-docusate 8.6-50 MG tablet Commonly known as: Senokot-S Take 2 tablets by mouth 2 (two) times daily. For constipation.   Spacer/Aero-Holding Raguel French 1 each by Does not apply route as needed.               Durable Medical Equipment  (From admission, onward)           Start     Ordered   11/06/24 0901  For home use only DME Walker rolling  Once       Question Answer Comment  Walker: With 5 Inch Wheels   Patient needs a walker to treat with the following condition Weakness      11/06/24 0901              Discharge Care Instructions  (From admission, onward)           Start     Ordered   11/06/24 0000  Discharge wound care:       Comments: As per general surgery   11/06/24 1556            Contact information for follow-up providers     Maczis, Puja Gosai, PA-C. Go on 12/04/2024.   Specialty: General Surgery Why: 10 AM. Please plan to arrive 30 min prior to appointment time. Contact information: [address] [facility] [place] [address] [phone]         [facility] (DME) Follow up.   Specialty: DME Services Why: Company rolling walker was ordered from [employer] information: [address] [employer] [place]  [phone]        [facility] Follow up.   Contact information: [address] [place] [address] [phone]              Contact information for after-discharge care     Home Medical Care     [facility]) .   Service: Home Health Services Contact information: [address] [place]  [address] [phone]  Discharge Exam: Filed Weights   11/05/24 0848 11/05/24 1131 11/06/24 0500  Weight: 94.5 kg 95.3 kg 95.5 kg   ***  Condition at discharge: {DC Condition:26389}  The results of significant diagnostics from this hospitalization (including imaging, microbiology, ancillary and laboratory) are listed below for reference.   Imaging Studies: VAS US  LOWER EXTREMITY VENOUS (DVT) Result Date: 11/03/2024  Lower Venous DVT Study Patient Name:  SHAKAI DOLLEY  Date of Exam:   11/03/2024 Medical Rec #: [id number]           Accession #:    [id number] Date of Birth: 1951/07/27          Patient Gender: M Patient Age:   74 years Exam Location:  [facility] Procedure:      VAS US  LOWER EXTREMITY VENOUS (DVT) Referring Phys: EKTA PATEL --------------------------------------------------------------------------------  Indications: Left lateral thigh pain.  Comparison Study: No previous exams Performing Technologist: Jody Hill RVT, RDMS  Examination Guidelines: A complete evaluation includes B-mode imaging, spectral Doppler, color Doppler, and power Doppler as needed of all accessible portions of each vessel. Bilateral testing is considered an integral part of a complete examination. Limited examinations for reoccurring indications may be performed as noted. The reflux portion of the exam is performed with the patient in reverse Trendelenburg.  +-----+---------------+---------+-----------+----------+--------------+ RIGHTCompressibilityPhasicitySpontaneityPropertiesThrombus Aging  +-----+---------------+---------+-----------+----------+--------------+ CFV  Full           Yes      Yes                                 +-----+---------------+---------+-----------+----------+--------------+   +---------+---------------+---------+-----------+----------+--------------+ LEFT     CompressibilityPhasicitySpontaneityPropertiesThrombus Aging +---------+---------------+---------+-----------+----------+--------------+ CFV      Full           Yes      Yes                                 +---------+---------------+---------+-----------+----------+--------------+ SFJ      Full                                                        +---------+---------------+---------+-----------+----------+--------------+ FV Prox  Full           Yes      Yes                                 +---------+---------------+---------+-----------+----------+--------------+ FV Mid   Full           Yes      Yes                                 +---------+---------------+---------+-----------+----------+--------------+ FV DistalFull           Yes      Yes                                 +---------+---------------+---------+-----------+----------+--------------+ PFV      Full                                                        +---------+---------------+---------+-----------+----------+--------------+  POP      Full           Yes      Yes                                 +---------+---------------+---------+-----------+----------+--------------+ PTV      Full                                                        +---------+---------------+---------+-----------+----------+--------------+ PERO     Full                                                        +---------+---------------+---------+-----------+----------+--------------+     Summary: RIGHT: - No evidence of common femoral vein obstruction.  - There is no evidence of deep vein thrombosis in the lower  extremity.  LEFT: - There is no evidence of deep vein thrombosis in the lower extremity.  *See table(s) above for measurements and observations. Electronically signed by Gaile New MD on 11/03/2024 at 6:07:52 PM.    Final    MR BRAIN WO CONTRAST Result Date: 11/02/2024 EXAM: MRI BRAIN WITHOUT CONTRAST 11/02/2024 01:52:46 PM TECHNIQUE: Multiplanar multisequence MRI of the head/brain was performed without the administration of intravenous contrast. COMPARISON: Head CT 11/02/2024 and MRI 10/11/2012. CLINICAL HISTORY: Neuro deficit, acute, stroke suspected; Patient has left-sided deficits and CT shows possible age-indeterminate stroke. He did fall when his left leg gave out on him. Rule out new stroke. FINDINGS: BRAIN AND VENTRICLES: No acute infarct, mass, midline shift, hydrocephalus, or extra-axial fluid collection is identified. There is a chronic infarct involving the right thalamus and posterior limb of the right internal capsule with associated chronic blood products, and this infarct was acute in 2013. A chronic microhemorrhage in the right occipital lobe is unchanged. Patchy T2 hyperintensities in the cerebral white matter and pons have mildly progressed from 2013 and are nonspecific but compatible with moderate chronic small vessel ischemic disease. There are multiple chronic lacunar infarcts in the basal ganglia and cerebellum bilaterally which are largely new from 2013. There is mild generalized cerebral atrophy. Major intracranial vascular flow voids are preserved. ORBITS: Bilateral cataract extraction. SINUSES AND MASTOIDS: Chronic right maxillary sinusitis with sinus expansion and bowing into the nasal cavity as described on CT. Extensive right anterior and mid ethmoid air cell opacification as well. No significant mastoid fluid. BONES AND SOFT TISSUES: Normal marrow signal. No acute soft tissue abnormality. IMPRESSION: 1. No acute intracranial abnormality. 2. Chronic right thalamic infarct. 3.  Moderate chronic small vessel ischemic disease. 4. Small chronic cerebellar infarcts. Electronically signed by: Dasie Hamburg MD 11/02/2024 02:11 PM EST RP Workstation: [id number]   US  SCROTUM W/DOPPLER Result Date: 11/02/2024 CLINICAL DATA:  Left groin pain. EXAM: SCROTAL ULTRASOUND DOPPLER ULTRASOUND OF THE TESTICLES TECHNIQUE: Complete ultrasound examination of the testicles, epididymis, and other scrotal structures was performed. Color and spectral Doppler ultrasound were also utilized to evaluate blood flow to the testicles. COMPARISON:  None Available. FINDINGS: Right testicle Measurements: 3.0 x 1.7 x 2.4 cm. No mass or microlithiasis visualized. Doppler: There is normal vascularity on  color doppler examination. Spectral doppler arterial and venous waveforms are normal. Left testicle Measurements:  3.0 x 1.9 x 2.6 cm. No mass or microlithiasis visualized. Doppler: There is normal vascularity on color doppler examination. Spectral doppler arterial and venous waveforms are normal. Right epididymis:  Normal in size and appearance. Left epididymis: Normal in size and appearance. 3 mm epididymal head cyst. Hydrocele:  None visualized. Varicocele:  None visualized. IMPRESSION: No acute abnormality identified. Electronically Signed   By: Harrietta Sherry M.D.   On: 11/02/2024 13:02   CT ABDOMEN PELVIS W CONTRAST Result Date: 11/02/2024 EXAM: CT ABDOMEN AND PELVIS WITH CONTRAST 11/02/2024 11:09:09 AM TECHNIQUE: CT of the abdomen and pelvis was performed with the administration of 60 mL of iohexol  (OMNIPAQUE ) 350 MG/ML injection. Multiplanar reformatted images are provided for review. Automated exposure control, iterative reconstruction, and/or weight-based adjustment of the mA/kV was utilized to reduce the radiation dose to as low as reasonably achievable. COMPARISON: Comparison is made to 07/14/2023. CLINICAL HISTORY: Left inguinal tenderness and possible hernia. FINDINGS: LOWER CHEST: No acute abnormality.  LIVER: The liver is unremarkable. GALLBLADDER AND BILE DUCTS: Cholelithiasis without superimposed pericholecystic inflammatory change. No intra or extrahepatic biliary ductal dilation. SPLEEN: No acute abnormality. PANCREAS: Lobulated cystic lesion is again seen within the uncinate process of the pancreas measuring 2.0 x 2.4 cm, mildly enlarged since prior examination. Several small septa are noted and the lesion may represent a serous cystadenoma, but is not optimally characterized on this examination. If indicated, this will be better assessed with dedicated contrast enhanced MRI examination. ADRENAL GLANDS: No acute abnormality. KIDNEYS, URETERS AND BLADDER: No stones in the kidneys or ureters. No hydronephrosis. No perinephric or periureteral stranding. Urinary bladder is unremarkable. GI AND BOWEL: Small left inguinal hernia contains a single loop of unremarkable mid small bowel. Small fat-containing right inguinal hernia. The stomach, small bowel, and large bowel are otherwise unremarkable. Appendix normal. There is no bowel obstruction. PERITONEUM AND RETROPERITONEUM: No ascites. No free air. VASCULATURE: Aorta is normal in caliber. Mild aortoiliac atherosclerotic calcification. No aortic aneurysm. LYMPH NODES: No lymphadenopathy. REPRODUCTIVE ORGANS: No acute abnormality. BONES AND SOFT TISSUES: No acute osseous abnormality. No focal soft tissue abnormality. IMPRESSION: 1. Small left inguinal hernia containing a single loop of unremarkable mid small bowel. No superimposed inflammatory changes. 2. Small fat-containing right inguinal hernia. 3. Lobulated cystic lesion within the uncinate process of the pancreas, mildly enlarged since prior examination, indeterminate; recommend pancreas-protocol MRI for further characterization, if clinically indicated . Electronically signed by: Dorethia Molt MD 11/02/2024 11:55 AM EST RP Workstation: [id number]   DG Chest 2 View Result Date: 11/02/2024 CLINICAL DATA:   Fall EXAM: CHEST - 2 VIEW COMPARISON:  March 09, 2024 FINDINGS: The heart size and mediastinal contours are within normal limits. Both lungs are clear. The visualized skeletal structures are unremarkable. IMPRESSION: No active cardiopulmonary disease. Electronically Signed   By: Lynwood Landy Raddle M.D.   On: 11/02/2024 08:56   DG Knee Complete 4 Views Left Result Date: 11/02/2024 CLINICAL DATA:  Fall EXAM: LEFT KNEE - COMPLETE 4+ VIEW COMPARISON:  None Available. FINDINGS: No evidence of fracture, dislocation, or joint effusion. Status post ACL reconstruction. Mild patellar spurring. Moderate narrowing of medial joint space. Soft tissues are unremarkable. IMPRESSION: No acute abnormality seen. Electronically Signed   By: Lynwood Landy Raddle M.D.   On: 11/02/2024 08:55   DG Femur Min 2 Views Left Result Date: 11/02/2024 CLINICAL DATA:  Fall EXAM: LEFT FEMUR 2 VIEWS COMPARISON:  None  Available. FINDINGS: There is no evidence of fracture or other focal bone lesions. Soft tissues are unremarkable. Status post ACL reconstruction. IMPRESSION: No acute abnormality seen. Electronically Signed   By: Lynwood Landy Raddle M.D.   On: 11/02/2024 08:54   DG Pelvis 1-2 Views Result Date: 11/02/2024 CLINICAL DATA:  Fall EXAM: PELVIS - 1-2 VIEW COMPARISON:  None Available. FINDINGS: There is no evidence of pelvic fracture or diastasis. No pelvic bone lesions are seen. IMPRESSION: Negative. Electronically Signed   By: Lynwood Landy Raddle M.D.   On: 11/02/2024 08:52   CT Cervical Spine Wo Contrast Result Date: 11/02/2024 EXAM: CT CERVICAL SPINE WITHOUT CONTRAST 11/02/2024 08:03:00 AM TECHNIQUE: CT of the cervical spine was performed without the administration of intravenous contrast. Multiplanar reformatted images are provided for review. Automated exposure control, iterative reconstruction, and/or weight based adjustment of the mA/kV was utilized to reduce the radiation dose to as low as reasonably achievable. COMPARISON: None  available. CLINICAL HISTORY: Polytrauma, blunt Polytrauma, blunt FINDINGS: CERVICAL SPINE: BONES AND ALIGNMENT: No acute fracture or traumatic malalignment. DEGENERATIVE CHANGES: No significant degenerative changes. SOFT TISSUES: No prevertebral soft tissue swelling. IMPRESSION: 1. No acute abnormality of the cervical spine. Electronically signed by: Evalene Coho MD 11/02/2024 08:09 AM EST RP Workstation: [id number]   CT Head Wo Contrast Result Date: 11/02/2024 EXAM: CT HEAD WITHOUT 11/02/2024 08:03:00 AM TECHNIQUE: CT of the head was performed without the administration of intravenous contrast. Automated exposure control, iterative reconstruction, and/or weight based adjustment of the mA/kV was utilized to reduce the radiation dose to as low as reasonably achievable. COMPARISON: CT of the head dated 10/10/2012. CLINICAL HISTORY: Head trauma, minor (Age >= 65y). FINDINGS: BRAIN AND VENTRICLES: No acute intracranial hemorrhage. No mass effect or midline shift. No extra-axial fluid collection. No evidence of acute infarct. No hydrocephalus. Age indeterminate infarct in the right thalamus. There is age-related atrophy and mild-to-moderate cerebral white matter disease. There is no evidence of acute intracranial injury. ORBITS: No acute abnormality. Bilateral lens replacement noted. SINUSES AND MASTOIDS: Right maxillary sinus: Complete opacification with hyperdensity, radiodense debris, and calcification, suggesting chronic sinusitis. Dehiscence and bowing of the medial wall. Left maxillary sinus: Polypoid mucosal thickening within the floor. Right ethmoid sinus/air cells: Mucosal thickening and moderate opacification. SOFT TISSUES AND SKULL: No acute skull fracture. No acute soft tissue abnormality. IMPRESSION: 1. No acute intracranial abnormality. 2. Age indeterminate infarct in the right thalamus. 3. 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 Polypoid mucosal thickening in the left maxillary sinus. Electronically signed by: Evalene Coho MD 11/02/2024 08:08 AM EST RP Workstation: [id number]    Microbiology: Results for orders placed or performed during the hospital encounter of 12/14/23  Resp panel by RT-PCR (RSV, Flu A&B, Covid) Anterior Nasal Swab     Status: None   Collection Time: 12/14/23  7:43 PM   Specimen: Anterior Nasal Swab  Result Value Ref Range Status   SARS Coronavirus 2 by RT PCR NEGATIVE NEGATIVE Final   Influenza A by PCR NEGATIVE NEGATIVE Final   Influenza B by PCR NEGATIVE NEGATIVE Final    Comment: (NOTE) The Xpert Xpress SARS-CoV-2/FLU/RSV plus assay is intended as an aid in the diagnosis of influenza from Nasopharyngeal swab specimens and should not be used as a sole basis for treatment. Nasal washings and aspirates are unacceptable for Xpert Xpress SARS-CoV-2/FLU/RSV testing.  Fact Sheet for Patients: bloggercourse.com  Fact Sheet for Healthcare Providers: seriousbroker.it  This test is not yet approved or cleared by the [place]  FDA and has been authorized for detection and/or diagnosis of SARS-CoV-2 by FDA under an Emergency Use Authorization (EUA). This EUA will remain in effect (meaning this test can be used) for the duration of the COVID-19 declaration under Section 564(b)(1) of the Act, 21 U.S.C. section 360bbb-3(b)(1), unless the authorization is terminated or revoked.     Resp Syncytial Virus by PCR NEGATIVE NEGATIVE Final    Comment: (NOTE) Fact Sheet for Patients: bloggercourse.com  Fact Sheet for Healthcare Providers: seriousbroker.it  This test is not yet approved or cleared by the [place]  FDA and has been authorized for detection and/or diagnosis of SARS-CoV-2 by FDA under an Emergency Use Authorization (EUA). This EUA will remain in effect  (meaning this test can be used) for the duration of the COVID-19 declaration under Section 564(b)(1) of the Act, 21 U.S.C. section 360bbb-3(b)(1), unless the authorization is terminated or revoked.  Performed at [facility], [address], [place], [place] [address]    *Note: Due to a large number of results and/or encounters for the requested time period, some results have not been displayed. A complete set of results can be found in Results Review.    Labs: CBC: Recent Labs  Lab 11/02/24 0736 11/02/24 2342 11/04/24 0143 11/05/24 0452  WBC 7.4 7.1 6.7 7.5  NEUTROABS 4.1  --   --   --   HGB 10.3* 9.9* 10.7* 10.0*  HCT 31.5* 29.3* 32.5* 29.8*  MCV 96.6 93.9 95.0 95.2  PLT 153 162 165 153   Basic Metabolic Panel: Recent Labs  Lab 11/02/24 0736 11/02/24 2342 11/04/24 0143 11/05/24 0452  NA 138 141 142 139  K 4.4 3.8 4.2 4.3  CL 107 108 104 106  CO2 27 23 27 28   GLUCOSE 153* 216* 138* 146*  BUN 34* 32* 26* 22  CREATININE 2.02* 2.02* 1.89* 2.02*  CALCIUM  8.5* 8.9 9.4 8.7*  MG  --  1.7  --   --    Liver Function Tests: Recent Labs  Lab 11/02/24 0736 11/02/24 2342 11/04/24 0143 11/05/24 0452  AST 22 21 22 20   ALT 23 20 19 17   ALKPHOS 72 63 67 58  BILITOT 0.8 0.7 0.9 1.3*  PROT 7.2 6.4* 7.1 6.5  ALBUMIN 3.6 3.1* 3.5 3.2*   CBG: Recent Labs  Lab 11/06/24 0005 11/06/24 0356 11/06/24 0759 11/06/24 1202 11/06/24 1624  GLUCAP 250* 247* 269* 354* 186*    Discharge time spent: {LESS THAN/GREATER THAN:26388} 30 minutes.  Signed: Elgie Butter, MD [facility] 11/06/2024

## 2024-11-06 NOTE — Progress Notes (Signed)
 Progress Note  1 Day Post-Op  Subjective: Patient reports generalized soreness of his abdomen. Mostly tolerated regular diet. Had some nausea with belching. Denies vomiting. Has had flatulence. No BM since surgery.  ROS  All negative with the exception of above.  Objective: Vital signs in last 24 hours: Temp:  [97.5 F (36.4 C)-98.7 F (37.1 C)] 98.2 F (36.8 C) (12/16 0413) Pulse Rate:  [59-68] 62 (12/16 0413) Resp:  [11-19] 16 (12/16 0413) BP: (107-164)/(55-88) 107/56 (12/16 0413) SpO2:  [96 %-100 %] 100 % (12/16 0413) Weight:  [94.5 kg-95.5 kg] 95.5 kg (12/16 0500) Last BM Date : 11/02/24  Intake/Output from previous day: 12/15 0701 - 12/16 0700 In: 700 [I.V.:500; IV Piggyback:200] Out: 270 [Urine:250; Blood:20] Intake/Output this shift: No intake/output data recorded.  PE: General: Pleasant male who is laying in bed in NAD. HEENT: Head is normocephalic, atraumatic. Heart: HR normal during encounter. Lungs: Respiratory effort nonlabored. Abd: Soft, mild distention. Generalized tenderness to palpation. Left groin/lower pelvic incision with dermabond and steri strips C/D/I. No concern of active bleeding or infection. No rebound tenderness or guarding.  MS: Able to move all 4 extremities.  Skin: Warm and dry.  Psych: A&Ox3 with an appropriate affect.    Lab Results:  Recent Labs    11/04/24 0143 11/05/24 0452  WBC 6.7 7.5  HGB 10.7* 10.0*  HCT 32.5* 29.8*  PLT 165 153   BMET Recent Labs    11/04/24 0143 11/05/24 0452  NA 142 139  K 4.2 4.3  CL 104 106  CO2 27 28  GLUCOSE 138* 146*  BUN 26* 22  CREATININE 1.89* 2.02*  CALCIUM  9.4 8.7*   PT/INR No results for input(s): LABPROT, INR in the last 72 hours. CMP     Component Value Date/Time   NA 139 11/05/2024 0452   NA 138 01/25/2023 1120   K 4.3 11/05/2024 0452   CL 106 11/05/2024 0452   CO2 28 11/05/2024 0452   GLUCOSE 146 (H) 11/05/2024 0452   BUN 22 11/05/2024 0452   BUN 32 (H)  01/25/2023 1120   CREATININE 2.02 (H) 11/05/2024 0452   CALCIUM  8.7 (L) 11/05/2024 0452   PROT 6.5 11/05/2024 0452   PROT 7.2 12/24/2022 0812   ALBUMIN 3.2 (L) 11/05/2024 0452   ALBUMIN 4.1 12/24/2022 0812   AST 20 11/05/2024 0452   ALT 17 11/05/2024 0452   ALKPHOS 58 11/05/2024 0452   BILITOT 1.3 (H) 11/05/2024 0452   BILITOT 0.8 12/24/2022 0812   GFRNONAA 34 (L) 11/05/2024 0452   GFRAA 32 (L) 09/10/2020 1437   Lipase     Component Value Date/Time   LIPASE 57 (H) 07/15/2023 0553       Studies/Results: No results found.  Anti-infectives: Anti-infectives (From admission, onward)    Start     Dose/Rate Route Frequency Ordered Stop   11/05/24 0930  ciprofloxacin  (CIPRO ) IVPB 400 mg        400 mg 200 mL/hr over 60 Minutes Intravenous To [facility] 11/05/24 0904 11/05/24 1329        Assessment/Plan POD1: S/P Left inguinal hernia repair with UltraPro mesh patch by Dr. Ebbie on 11/05/2024 -Afebrile. -Exam with appropriate tenderness to palpation. Incision C/D/I. -Tolerating regular diet with some nausea. Denies vomiting. Having flatulence. No BM since surgery. -Mobilize as tolerated. Continue to work with therapies. -Discussed post operative expectations and restrictions in great detail with patient. Will provide outpatient follow up. Patient knows to confirm follow up visit. All questions were  answered. -Stable from general surgery standpoint. We will sign off. Please call for further questions and concerns.   FEN: Regular; IVF per primary team ID: Surgical [email or web address] Cipro     LOS: 2 days   I reviewed nursing notes, op notes, specialist notes, hospitalist notes, last 24 h vitals and pain scores, last 48 h intake and output, last 24 h labs and trends, and last 24 h imaging results.  Marjorie Carlyon Favre, [facility] 11/06/2024, 7:43 AM Please see Amion for pager number during day hours 7:00am-4:30pm

## 2024-11-06 NOTE — Plan of Care (Signed)
  Problem: Education: Goal: Ability to describe self-care measures that may prevent or decrease complications (Diabetes Survival Skills Education) will improve Outcome: Progressing Goal: Individualized Educational Video(s) Outcome: Progressing   Problem: Coping: Goal: Ability to adjust to condition or change in health will improve Outcome: Progressing   Problem: Fluid Volume: Goal: Ability to maintain a balanced intake and output will improve Outcome: Progressing   Problem: Health Behavior/Discharge Planning: Goal: Ability to identify and utilize available resources and services will improve Outcome: Progressing Goal: Ability to manage health-related needs will improve Outcome: Progressing   Problem: Metabolic: Goal: Ability to maintain appropriate glucose levels will improve Outcome: Progressing   Problem: Nutritional: Goal: Progress toward achieving an optimal weight will improve Outcome: Progressing   Problem: Skin Integrity: Goal: Risk for impaired skin integrity will decrease Outcome: Progressing   Problem: Tissue Perfusion: Goal: Adequacy of tissue perfusion will improve Outcome: Progressing   Problem: Education: Goal: Knowledge of General Education information will improve Description: Including pain rating scale, medication(s)/side effects and non-pharmacologic comfort measures Outcome: Progressing   Problem: Health Behavior/Discharge Planning: Goal: Ability to manage health-related needs will improve Outcome: Progressing   Problem: Clinical Measurements: Goal: Ability to maintain clinical measurements within normal limits will improve Outcome: Progressing Goal: Will remain free from infection Outcome: Progressing Goal: Diagnostic test results will improve Outcome: Progressing Goal: Respiratory complications will improve Outcome: Progressing Goal: Cardiovascular complication will be avoided Outcome: Progressing   Problem: Activity: Goal: Risk for  activity intolerance will decrease Outcome: Progressing   Problem: Nutrition: Goal: Adequate nutrition will be maintained Outcome: Progressing   Problem: Coping: Goal: Level of anxiety will decrease Outcome: Progressing   Problem: Elimination: Goal: Will not experience complications related to bowel motility Outcome: Progressing Goal: Will not experience complications related to urinary retention Outcome: Progressing   Problem: Pain Managment: Goal: General experience of comfort will improve and/or be controlled Outcome: Progressing   Problem: Safety: Goal: Ability to remain free from injury will improve Outcome: Progressing   Problem: Skin Integrity: Goal: Risk for impaired skin integrity will decrease Outcome: Progressing

## 2024-11-08 NOTE — Discharge Summary (Signed)
 Physician Discharge Summary   Patient: [name] MRN: [id number] DOB: 1951-02-24  Admit date:     11/02/2024  Discharge date: 11/06/2024  Discharge Physician: Elgie Butter   PCP: Geofm Glade PARAS, MD   Recommendations at discharge:  Please follow up with PCP in one week.  Please follow with general surgery as recommended.    Discharge Diagnoses: Principal Problem:   Left thigh pain  Resolved Problems:   * No resolved hospital problems. *  Hospital Course:   [name] is a 73 y.o. 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    He was also found to have left inguinal hernia with bowel which couldn't be reduced. General surgery consulted.    Assessment and Plan:  Left leg pain X rays are unremarkable.  Venous duplex is negative for DVT.  Pain control.prn muscle relaxants.  Physical therapy evaluations.      Left inguinal hernia Gen surgery on board. Underwent hernia repair  with mesh placement.  Started on diet and able to advance without any issues.  Worked with therapy and outpatient PT ordered.  Pain control.        Type 2 DM Resume home meds.  Resume SSI.  A1c is 7.6% No changes      Anemia of chronic disease Hemoglobin stable between 9 to 10.      Stage 3b CKD Creatinine at baseline.  Creatinine between 1.8 to 2.      Hypertension Well controlled.  Resume coreg .      Hyperlipidemia Resume statin.    Mild elevated in bilirubin.  Total bili is 1.3      Lobulated cystic lesion within the uncinate process of the pancreas, mildly enlarged since prior examination, indeterminate; recommend pancreas-protocol MRI for further characterization as outpatient.  Discussed with patient.    Estimated body mass index is 29.29 kg/m as calculated from the following:    Height as of this encounter: 5' 11 (1.803 m).   Weight as of this encounter: 95.3 kg.   Consultants: general surgery.  Procedures performed: hernia repair  Disposition: Home Diet recommendation:  Regular diet DISCHARGE MEDICATION: Allergies as of 11/06/2024       Reactions   Penicillins Anaphylaxis   Pneumococcal Vaccines Anaphylaxis   Shellfish Allergy Anaphylaxis   Barbiturates    Gabapentin     Urinary retention   Influenza Vaccines    Lisinopril  Cough   Other Nausea And Vomiting   Pt wife states that dye that is used for eye exam in dr    Sildenafil Other (See Comments), Nausea And Vomiting   Other reaction(s): Headache, Dizziness   Topiramate Other (See Comments)   Chest spasms and numbness   Zofran  [ondansetron ]    Amlodipine  Other (See Comments)   LE edema   Metformin  Nausea And Vomiting   Tadalafil Other (See Comments)   dizziness   Vardenafil Other (See Comments)   headaches        Medication List     PAUSE taking these medications    losartan  100 MG tablet Wait to take this until your doctor or other care provider tells you to start again. Commonly known as: COZAAR  TAKE 1 TABLET EVERY DAY       STOP taking these medications    chlorthalidone  25 MG tablet Commonly known as: HYGROTON   TAKE these medications    Accu-Chek Aviva Plus test strip Generic drug: glucose blood Use as directed to check blood sugar 3 times daily (uad tid)   acetaminophen  500 MG tablet Commonly known as: TYLENOL  Take 1 tablet (500 mg total) by mouth every 6 (six) hours as needed for mild pain (pain score 1-3).   aspirin  EC 81 MG tablet Take 1 tablet (81 mg total) by mouth daily. Swallow whole.   atorvastatin  40 MG tablet Commonly known as: LIPITOR TAKE 1 TABLET EVERY DAY   Blood Pressure Cuff Misc 1 Units by Does not apply route daily.   budesonide -formoterol  80-4.5 MCG/ACT inhaler Commonly known as: Symbicort  2 puffs BID and prn shortness of breath  or wheezing   calcium  carbonate 500 MG chewable tablet Commonly known as: TUMS - dosed in mg elemental calcium  Chew 1 tablet by mouth daily as needed for indigestion or heartburn.   carvedilol  25 MG tablet Commonly known as: COREG  TAKE 1 TABLET TWICE DAILY   clopidogrel  75 MG tablet Commonly known as: PLAVIX  TAKE 1 TABLET EVERY DAY   fluticasone  50 MCG/ACT nasal spray Commonly known as: FLONASE  Place 2 sprays into both nostrils daily. What changed:  when to take this reasons to take this   furosemide  40 MG tablet Commonly known as: LASIX  TAKE 1 TABLET (40 MG TOTAL) BY MOUTH DAILY. NEED APPT.   Gluco to Go 15 40 % Gel Generic drug: dextrose  Take 1 Tube by mouth once as needed for low blood sugar.   insulin  lispro 100 UNIT/ML KwikPen Commonly known as: HumaLOG  KwikPen Inject 6-10 Units into the skin 2 (two) times daily before a meal.   Insulin  Pen Needle 32G X 4 MM Misc Use 4x a day   Lantus  SoloStar 100 UNIT/ML Solostar Pen Generic drug: insulin  glargine Inject 7-8 Units into the skin daily. What changed:  how much to take when to take this   Menthol  (Topical Analgesic) 4 % Gel Apply 1 application  topically daily as needed.   Microlet Lancets Misc UAD to check sugars TID.  E11.22   nitroGLYCERIN  0.4 MG SL tablet Commonly known as: Nitrostat  Place 1 tablet (0.4 mg total) under the tongue every 5 (five) minutes as needed for chest pain (up to 3 doses).   oxyCODONE  5 MG immediate release tablet Commonly known as: Oxy IR/ROXICODONE  Take 1 tablet (5 mg total) by mouth every 6 (six) hours as needed for moderate pain (pain score 4-6) or severe pain (pain score 7-10).   pantoprazole  40 MG tablet Commonly known as: PROTONIX  Take 1 tablet (40 mg total) by mouth daily at 6 (six) AM.   senna-docusate 8.6-50 MG tablet Commonly known as: Senokot-S Take 2 tablets by mouth 2 (two) times daily. For constipation.   Spacer/Aero-Holding Raguel French 1 each by Does not  apply route as needed.               Discharge Care Instructions  (From admission, onward)           Start     Ordered   11/06/24 0000  Discharge wound care:       Comments: As per general surgery   11/06/24 1556            Contact information for follow-up providers     Maczis, Puja Gosai, PA-C. Go on 12/04/2024.   Specialty: General Surgery Why: 10 AM. Please plan to arrive 30 min prior to appointment time. Contact information: [address]  [facility] [place] [address] [phone]         [facility] (DME) Follow up.   Specialty: DME Services Why: Company rolling walker was ordered from [employer] information: [address] [employer] [place]  [address] [phone]        [facility] Follow up.   Contact information: [address] [place] [address] [phone]              Contact information for after-discharge care     Home Medical Care     [facility]) .   Service: Home Health Services Contact information: [address] [place]  [address] [phone]                    Discharge Exam: Fredricka Weights   11/05/24 0848 11/05/24 1131 11/06/24 0500  Weight: 94.5 kg 95.3 kg 95.5 kg   General exam: Appears calm and comfortable  Respiratory system: Clear to auscultation. Respiratory effort normal. Cardiovascular system: S1 & S2 heard, RRR. No JVD, Gastrointestinal system: Abdomen is soft , BS+ Central nervous system: Alert and oriented. No focal neurological deficits. Extremities: Symmetric 5 x 5 power. Skin: No rashes, lesions or ulcers Psychiatry: Judgement and insight appear normal. Mood & affect appropriate.    Condition at discharge: fair  The results of significant diagnostics from this hospitalization (including imaging, microbiology, ancillary and  laboratory) are listed below for reference.   Imaging Studies: VAS US  LOWER EXTREMITY VENOUS (DVT) Result Date: 11/03/2024  Lower Venous DVT Study Patient Name:  ADARRYL GOLDAMMER  Date of Exam:   11/03/2024 Medical Rec #: [id number]           Accession #:    [id number] Date of Birth: 1951/01/13          Patient Gender: M Patient Age:   73 years Exam [facility]  [facility] Procedure:      VAS US  LOWER EXTREMITY VENOUS (DVT) Referring Phys: EKTA PATEL --------------------------------------------------------------------------------  Indications: Left lateral thigh pain.  Comparison Study: No previous exams Performing Technologist: Jody Hill RVT, RDMS  Examination Guidelines: A complete evaluation includes B-mode imaging, spectral Doppler, color Doppler, and power Doppler as needed of all accessible portions of each vessel. Bilateral testing is considered an integral part of a complete examination. Limited examinations for reoccurring indications may be performed as noted. The reflux portion of the exam is performed with the patient in reverse Trendelenburg.  +-----+---------------+---------+-----------+----------+--------------+ RIGHTCompressibilityPhasicitySpontaneityPropertiesThrombus Aging +-----+---------------+---------+-----------+----------+--------------+ CFV  Full           Yes      Yes                                 +-----+---------------+---------+-----------+----------+--------------+   +---------+---------------+---------+-----------+----------+--------------+ LEFT     CompressibilityPhasicitySpontaneityPropertiesThrombus Aging +---------+---------------+---------+-----------+----------+--------------+ CFV      Full           Yes      Yes                                 +---------+---------------+---------+-----------+----------+--------------+ SFJ      Full                                                         +---------+---------------+---------+-----------+----------+--------------+  FV Prox  Full           Yes      Yes                                 +---------+---------------+---------+-----------+----------+--------------+ FV Mid   Full           Yes      Yes                                 +---------+---------------+---------+-----------+----------+--------------+ FV DistalFull           Yes      Yes                                 +---------+---------------+---------+-----------+----------+--------------+ PFV      Full                                                        +---------+---------------+---------+-----------+----------+--------------+ POP      Full           Yes      Yes                                 +---------+---------------+---------+-----------+----------+--------------+ PTV      Full                                                        +---------+---------------+---------+-----------+----------+--------------+ PERO     Full                                                        +---------+---------------+---------+-----------+----------+--------------+     Summary: RIGHT: - No evidence of common femoral vein obstruction.  - There is no evidence of deep vein thrombosis in the lower extremity.  LEFT: - There is no evidence of deep vein thrombosis in the lower extremity.  *See table(s) above for measurements and observations. Electronically signed by Gaile New MD on 11/03/2024 at 6:07:52 PM.    Final    MR BRAIN WO CONTRAST Result Date: 11/02/2024 EXAM: MRI BRAIN WITHOUT CONTRAST 11/02/2024 01:52:46 PM TECHNIQUE: Multiplanar multisequence MRI of the head/brain was performed without the administration of intravenous contrast. COMPARISON: Head CT 11/02/2024 and MRI 10/11/2012. CLINICAL HISTORY: Neuro deficit, acute, stroke suspected; Patient has left-sided deficits and CT shows possible age-indeterminate stroke. He did fall when his left leg  gave out on him. Rule out new stroke. FINDINGS: BRAIN AND VENTRICLES: No acute infarct, mass, midline shift, hydrocephalus, or extra-axial fluid collection is identified. There is a chronic infarct involving the right thalamus and posterior limb of the right internal capsule with associated chronic blood products, and this infarct was acute in 2013. A chronic microhemorrhage in the right occipital lobe is  unchanged. Patchy T2 hyperintensities in the cerebral white matter and pons have mildly progressed from 2013 and are nonspecific but compatible with moderate chronic small vessel ischemic disease. There are multiple chronic lacunar infarcts in the basal ganglia and cerebellum bilaterally which are largely new from 2013. There is mild generalized cerebral atrophy. Major intracranial vascular flow voids are preserved. ORBITS: Bilateral cataract extraction. SINUSES AND MASTOIDS: Chronic right maxillary sinusitis with sinus expansion and bowing into the nasal cavity as described on CT. Extensive right anterior and mid ethmoid air cell opacification as well. No significant mastoid fluid. BONES AND SOFT TISSUES: Normal marrow signal. No acute soft tissue abnormality. IMPRESSION: 1. No acute intracranial abnormality. 2. Chronic right thalamic infarct. 3. Moderate chronic small vessel ischemic disease. 4. Small chronic cerebellar infarcts. Electronically signed by: Dasie Hamburg MD 11/02/2024 02:11 PM EST RP Workstation: [id number]   US  SCROTUM W/DOPPLER Result Date: 11/02/2024 CLINICAL DATA:  Left groin pain. EXAM: SCROTAL ULTRASOUND DOPPLER ULTRASOUND OF THE TESTICLES TECHNIQUE: Complete ultrasound examination of the testicles, epididymis, and other scrotal structures was performed. Color and spectral Doppler ultrasound were also utilized to evaluate blood flow to the testicles. COMPARISON:  None Available. FINDINGS: Right testicle Measurements: 3.0 x 1.7 x 2.4 cm. No mass or microlithiasis visualized. Doppler: There  is normal vascularity on color doppler examination. Spectral doppler arterial and venous waveforms are normal. Left testicle Measurements:  3.0 x 1.9 x 2.6 cm. No mass or microlithiasis visualized. Doppler: There is normal vascularity on color doppler examination. Spectral doppler arterial and venous waveforms are normal. Right epididymis:  Normal in size and appearance. Left epididymis: Normal in size and appearance. 3 mm epididymal head cyst. Hydrocele:  None visualized. Varicocele:  None visualized. IMPRESSION: No acute abnormality identified. Electronically Signed   By: Harrietta Sherry M.D.   On: 11/02/2024 13:02   CT ABDOMEN PELVIS W CONTRAST Result Date: 11/02/2024 EXAM: CT ABDOMEN AND PELVIS WITH CONTRAST 11/02/2024 11:09:09 AM TECHNIQUE: CT of the abdomen and pelvis was performed with the administration of 60 mL of iohexol  (OMNIPAQUE ) 350 MG/ML injection. Multiplanar reformatted images are provided for review. Automated exposure control, iterative reconstruction, and/or weight-based adjustment of the mA/kV was utilized to reduce the radiation dose to as low as reasonably achievable. COMPARISON: Comparison is made to 07/14/2023. CLINICAL HISTORY: Left inguinal tenderness and possible hernia. FINDINGS: LOWER CHEST: No acute abnormality. LIVER: The liver is unremarkable. GALLBLADDER AND BILE DUCTS: Cholelithiasis without superimposed pericholecystic inflammatory change. No intra or extrahepatic biliary ductal dilation. SPLEEN: No acute abnormality. PANCREAS: Lobulated cystic lesion is again seen within the uncinate process of the pancreas measuring 2.0 x 2.4 cm, mildly enlarged since prior examination. Several small septa are noted and the lesion may represent a serous cystadenoma, but is not optimally characterized on this examination. If indicated, this will be better assessed with dedicated contrast enhanced MRI examination. ADRENAL GLANDS: No acute abnormality. KIDNEYS, URETERS AND BLADDER: No stones  in the kidneys or ureters. No hydronephrosis. No perinephric or periureteral stranding. Urinary bladder is unremarkable. GI AND BOWEL: Small left inguinal hernia contains a single loop of unremarkable mid small bowel. Small fat-containing right inguinal hernia. The stomach, small bowel, and large bowel are otherwise unremarkable. Appendix normal. There is no bowel obstruction. PERITONEUM AND RETROPERITONEUM: No ascites. No free air. VASCULATURE: Aorta is normal in caliber. Mild aortoiliac atherosclerotic calcification. No aortic aneurysm. LYMPH NODES: No lymphadenopathy. REPRODUCTIVE ORGANS: No acute abnormality. BONES AND SOFT TISSUES: No acute osseous abnormality. No focal soft tissue abnormality. IMPRESSION:  1. Small left inguinal hernia containing a single loop of unremarkable mid small bowel. No superimposed inflammatory changes. 2. Small fat-containing right inguinal hernia. 3. Lobulated cystic lesion within the uncinate process of the pancreas, mildly enlarged since prior examination, indeterminate; recommend pancreas-protocol MRI for further characterization, if clinically indicated . Electronically signed by: Dorethia Molt MD 11/02/2024 11:55 AM EST RP Workstation: [id number]   DG Chest 2 View Result Date: 11/02/2024 CLINICAL DATA:  Fall EXAM: CHEST - 2 VIEW COMPARISON:  March 09, 2024 FINDINGS: The heart size and mediastinal contours are within normal limits. Both lungs are clear. The visualized skeletal structures are unremarkable. IMPRESSION: No active cardiopulmonary disease. Electronically Signed   By: Lynwood Landy Raddle M.D.   On: 11/02/2024 08:56   DG Knee Complete 4 Views Left Result Date: 11/02/2024 CLINICAL DATA:  Fall EXAM: LEFT KNEE - COMPLETE 4+ VIEW COMPARISON:  None Available. FINDINGS: No evidence of fracture, dislocation, or joint effusion. Status post ACL reconstruction. Mild patellar spurring. Moderate narrowing of medial joint space. Soft tissues are unremarkable. IMPRESSION: No  acute abnormality seen. Electronically Signed   By: Lynwood Landy Raddle M.D.   On: 11/02/2024 08:55   DG Femur Min 2 Views Left Result Date: 11/02/2024 CLINICAL DATA:  Fall EXAM: LEFT FEMUR 2 VIEWS COMPARISON:  None Available. FINDINGS: There is no evidence of fracture or other focal bone lesions. Soft tissues are unremarkable. Status post ACL reconstruction. IMPRESSION: No acute abnormality seen. Electronically Signed   By: Lynwood Landy Raddle M.D.   On: 11/02/2024 08:54   DG Pelvis 1-2 Views Result Date: 11/02/2024 CLINICAL DATA:  Fall EXAM: PELVIS - 1-2 VIEW COMPARISON:  None Available. FINDINGS: There is no evidence of pelvic fracture or diastasis. No pelvic bone lesions are seen. IMPRESSION: Negative. Electronically Signed   By: Lynwood Landy Raddle M.D.   On: 11/02/2024 08:52   CT Cervical Spine Wo Contrast Result Date: 11/02/2024 EXAM: CT CERVICAL SPINE WITHOUT CONTRAST 11/02/2024 08:03:00 AM TECHNIQUE: CT of the cervical spine was performed without the administration of intravenous contrast. Multiplanar reformatted images are provided for review. Automated exposure control, iterative reconstruction, and/or weight based adjustment of the mA/kV was utilized to reduce the radiation dose to as low as reasonably achievable. COMPARISON: None available. CLINICAL HISTORY: Polytrauma, blunt Polytrauma, blunt FINDINGS: CERVICAL SPINE: BONES AND ALIGNMENT: No acute fracture or traumatic malalignment. DEGENERATIVE CHANGES: No significant degenerative changes. SOFT TISSUES: No prevertebral soft tissue swelling. IMPRESSION: 1. No acute abnormality of the cervical spine. Electronically signed by: Evalene Coho MD 11/02/2024 08:09 AM EST RP Workstation: [id number]   CT Head Wo Contrast Result Date: 11/02/2024 EXAM: CT HEAD WITHOUT 11/02/2024 08:03:00 AM TECHNIQUE: CT of the head was performed without the administration of intravenous contrast. Automated exposure control, iterative reconstruction, and/or weight based  adjustment of the mA/kV was utilized to reduce the radiation dose to as low as reasonably achievable. COMPARISON: CT of the head dated 10/10/2012. CLINICAL HISTORY: Head trauma, minor (Age >= 65y). FINDINGS: BRAIN AND VENTRICLES: No acute intracranial hemorrhage. No mass effect or midline shift. No extra-axial fluid collection. No evidence of acute infarct. No hydrocephalus. Age indeterminate infarct in the right thalamus. There is age-related atrophy and mild-to-moderate cerebral white matter disease. There is no evidence of acute intracranial injury. ORBITS: No acute abnormality. Bilateral lens replacement noted. SINUSES AND MASTOIDS: Right maxillary sinus: Complete opacification with hyperdensity, radiodense debris, and calcification, suggesting chronic sinusitis. Dehiscence and bowing of the medial wall. Left maxillary sinus: Polypoid mucosal thickening within  the floor. Right ethmoid sinus/air cells: Mucosal thickening and moderate opacification. SOFT TISSUES AND SKULL: No acute skull fracture. No acute soft tissue abnormality. IMPRESSION: 1. No acute intracranial abnormality. 2. Age indeterminate infarct in the right thalamus. 3. 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 Polypoid mucosal thickening in the left maxillary sinus. Electronically signed by: Evalene Coho MD 11/02/2024 08:08 AM EST RP Workstation: [id number]    Microbiology: Results for orders placed or performed during the hospital encounter of 12/14/23  Resp panel by RT-PCR (RSV, Flu A&B, Covid) Anterior Nasal Swab     Status: None   Collection Time: 12/14/23  7:43 PM   Specimen: Anterior Nasal Swab  Result Value Ref Range Status   SARS Coronavirus 2 by RT PCR NEGATIVE NEGATIVE Final   Influenza A by PCR NEGATIVE NEGATIVE Final   Influenza B by PCR NEGATIVE NEGATIVE Final    Comment: (NOTE) The Xpert Xpress SARS-CoV-2/FLU/RSV plus assay is  intended as an aid in the diagnosis of influenza from Nasopharyngeal swab specimens and should not be used as a sole basis for treatment. Nasal washings and aspirates are unacceptable for Xpert Xpress SARS-CoV-2/FLU/RSV testing.  Fact Sheet for Patients: bloggercourse.com  Fact Sheet for Healthcare Providers: seriousbroker.it  This test is not yet approved or cleared by the [place]  FDA and has been authorized for detection and/or diagnosis of SARS-CoV-2 by FDA under an Emergency Use Authorization (EUA). This EUA will remain in effect (meaning this test can be used) for the duration of the COVID-19 declaration under Section 564(b)(1) of the Act, 21 U.S.C. section 360bbb-3(b)(1), unless the authorization is terminated or revoked.     Resp Syncytial Virus by PCR NEGATIVE NEGATIVE Final    Comment: (NOTE) Fact Sheet for Patients: bloggercourse.com  Fact Sheet for Healthcare Providers: seriousbroker.it  This test is not yet approved or cleared by the [place]  FDA and has been authorized for detection and/or diagnosis of SARS-CoV-2 by FDA under an Emergency Use Authorization (EUA). This EUA will remain in effect (meaning this test can be used) for the duration of the COVID-19 declaration under Section 564(b)(1) of the Act, 21 U.S.C. section 360bbb-3(b)(1), unless the authorization is terminated or revoked.  Performed at [facility], [address] [place], [place] [address]    *Note: Due to a large number of results and/or encounters for the requested time period, some results have not been displayed. A complete set of results can be found in Results Review.    Labs: CBC: Recent Labs  Lab 11/02/24 0736 11/02/24 2342 11/04/24 0143 11/05/24 0452  WBC 7.4 7.1 6.7 7.5  NEUTROABS 4.1  --   --   --   HGB 10.3* 9.9* 10.7* 10.0*  HCT 31.5* 29.3* 32.5* 29.8*   MCV 96.6 93.9 95.0 95.2  PLT 153 162 165 153   Basic Metabolic Panel: Recent Labs  Lab 11/02/24 0736 11/02/24 2342 11/04/24 0143 11/05/24 0452  NA 138 141 142 139  K 4.4 3.8 4.2 4.3  CL 107 108 104 106  CO2 27 23 27 28   GLUCOSE 153* 216* 138* 146*  BUN 34* 32* 26* 22  CREATININE 2.02* 2.02* 1.89* 2.02*  CALCIUM  8.5* 8.9 9.4 8.7*  MG  --  1.7  --   --    Liver Function Tests: Recent Labs  Lab 11/02/24 0736 11/02/24 2342 11/04/24 0143 11/05/24 0452  AST 22 21 22 20   ALT  23 20 19 17   ALKPHOS 72 63 67 58  BILITOT 0.8 0.7 0.9 1.3*  PROT 7.2 6.4* 7.1 6.5  ALBUMIN 3.6 3.1* 3.5 3.2*   CBG: Recent Labs  Lab 11/06/24 0005 11/06/24 0356 11/06/24 0759 11/06/24 1202 11/06/24 1624  GLUCAP 250* 247* 269* 354* 186*    Discharge time spent: 37 MINUTES.   Signed: Elgie Butter, MD [facility] 11/08/2024

## 2024-11-11 ENCOUNTER — Other Ambulatory Visit (HOSPITAL_BASED_OUTPATIENT_CLINIC_OR_DEPARTMENT_OTHER): Payer: Self-pay | Admitting: Cardiovascular Disease

## 2024-11-11 DIAGNOSIS — I10 Essential (primary) hypertension: Secondary | ICD-10-CM

## 2024-11-13 ENCOUNTER — Other Ambulatory Visit: Payer: Self-pay

## 2024-11-13 ENCOUNTER — Encounter: Payer: Self-pay | Admitting: Physical Therapy

## 2024-11-13 ENCOUNTER — Ambulatory Visit: Attending: Internal Medicine | Admitting: Physical Therapy

## 2024-11-13 VITALS — BP 140/68 | HR 60

## 2024-11-13 DIAGNOSIS — R278 Other lack of coordination: Secondary | ICD-10-CM | POA: Diagnosis present

## 2024-11-13 DIAGNOSIS — Z8673 Personal history of transient ischemic attack (TIA), and cerebral infarction without residual deficits: Secondary | ICD-10-CM | POA: Diagnosis not present

## 2024-11-13 DIAGNOSIS — R2689 Other abnormalities of gait and mobility: Secondary | ICD-10-CM | POA: Diagnosis present

## 2024-11-13 DIAGNOSIS — M6281 Muscle weakness (generalized): Secondary | ICD-10-CM | POA: Diagnosis present

## 2024-11-13 DIAGNOSIS — R2681 Unsteadiness on feet: Secondary | ICD-10-CM | POA: Insufficient documentation

## 2024-11-13 DIAGNOSIS — M25552 Pain in left hip: Secondary | ICD-10-CM | POA: Insufficient documentation

## 2024-11-13 DIAGNOSIS — R293 Abnormal posture: Secondary | ICD-10-CM | POA: Insufficient documentation

## 2024-11-13 DIAGNOSIS — R262 Difficulty in walking, not elsewhere classified: Secondary | ICD-10-CM | POA: Diagnosis present

## 2024-11-13 NOTE — Patient Instructions (Signed)
 Desensitization Techniques: -Light touch/pressure:  using fingertip pressure to slowly move up small segments of the affected body part until outside the area of pain and then back to where you started -Deep pressure:  using fingertips to squeeze in small segments starting outside the affected area, crossing over the affected area, and then to the other side of the affected area -Tapping:  fingertips tap with firm pressure over the affected area, can move around the affected area as well -Brushing/scratching:  use fingertips to brush/scratch over and around the affected area -Textures:  use soft and rough textured items like cotton balls vs wash cloths to brush or rub with light into firm pressure over and around the affected area  Repeat these 3-4x per day.  You Can Walk For A Certain Length Of Time Each Day (use most appropriate assistive device - as of 12/23 use the walker)                          Walk 2-3 minutes 3-4 times per day.             Increase 1-2  minutes every week             Work up to 20 minutes (at least 1x a day).               Example:                         Day 1-2           4-5 minutes     3 times per day                         Day 7-8           10-12 minutes 2-3 times per day                         Day 13-14       20-22 minutes 1-2 times per day

## 2024-11-13 NOTE — Therapy (Signed)
 " OUTPATIENT PHYSICAL THERAPY NEURO EVALUATION   Patient Name: [name] MRN: [id number] DOB:November 17, 1951, 73 y.o., male Today's Date: 11/13/2024   PCP: Geofm Glade PARAS, MD REFERRING PROVIDER: Cherlyn Labella, MD  END OF SESSION:  PT End of Session - 11/13/24 1107     Visit Number 1    Number of Visits 9   8 + 1   Date for Recertification  01/18/25   pushed out due to holidays   Authorization Type [employer]    Progress Note Due on Visit 10    PT Start Time 1053    PT Stop Time 1149    PT Time Calculation (min) 56 min    Equipment Utilized During Treatment Gait belt    Activity Tolerance Patient tolerated treatment well    Behavior During Therapy WFL for tasks assessed/performed          Past Medical History:  Diagnosis Date   ALLERGIC RHINITIS    CAD, NATIVE VESSEL    BMS to OM1 2001, DES to BMS 2005   Chronic back pain    herniated disc   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   Chronic kidney disease    Constipation    takes Carafate  four times day   DIAB W/UNSPEC COMP TYPE II/UNSPEC TYPE UNCNTRL    ERECTILE DYSFUNCTION    GERD    HYPERLIPIDEMIA-MIXED    HYPERTENSION, BENIGN    LBBB (left bundle branch block) 02/02/2016   MORTON'S NEUROMA, RIGHT    OSA on CPAP 09/09/2020   Peripheral neuropathy    Seasonal allergies    takes Allegra and Benadryl  daily prn;uses Flonase  daily   SHOULDER PAIN, RIGHT    Snoring 09/09/2020   Stroke, 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, s/p CIR    TIA on medication 02/2012   Unstable angina (HCC) 07/09/2020   Past Surgical History:  Procedure Laterality Date   ANGIOPLASTY     COLONOSCOPY     CORONARY ANGIOPLASTY  2005   2 stents   CORONARY STENT INTERVENTION N/A 07/10/2020   Procedure: CORONARY STENT INTERVENTION;  Surgeon: Jordan, Peter M, MD;  Location: [facility];  Service: Cardiovascular;  Laterality: N/A;   DENTAL SURGERY     INGUINAL HERNIA REPAIR Left  11/05/2024   Procedure: REPAIR, HERNIA, INGUINAL, ADULT;  Surgeon: Ebbie Cough, MD;  Location: [facility] OR;  Service: General;  Laterality: Left;   LARYNGOPLASTY  08/07/2012   Procedure: LARYNGOPLASTY;  Surgeon: Ida Loader, MD;  Location: [facility] OR;  Service: ENT;  Laterality: Left;  Left Vocal Cord Medialyzation   LEFT HEART CATH AND CORONARY ANGIOGRAPHY N/A 07/10/2020   Procedure: LEFT HEART CATH AND CORONARY ANGIOGRAPHY;  Surgeon: Jordan, Peter M, MD;  Location: [facility] INVASIVE CV LAB;  Service: Cardiovascular;  Laterality: N/A;   left knee surgury     x 2    stent  2001, 2004   coronary stents   Patient Active Problem List   Diagnosis Date Noted   Left thigh pain 11/02/2024   Rash and other nonspecific skin eruption 01/18/2024   Right-sided face pain 01/18/2024   Conjunctivitis 12/16/2023   Near syncope 12/15/2023   Dehydration 12/15/2023   Plantar fasciitis of left foot 11/09/2023   Pancreatic mass -MRI for follow-up due 06/2024 07/19/2023   Calculus of gallbladder without cholecystitis without obstruction 07/19/2023   Constipation 07/19/2023   Intractable nausea and vomiting 07/14/2023   Epigastric pain 07/14/2023   Transaminitis 07/14/2023  Back pain 03/04/2023   Diarrhea 06/09/2022   Ganglion cyst of wrist, left 12/14/2021   Urinary frequency 11/06/2021   Hoarse 03/25/2021   Moderate episode of recurrent major depressive disorder (HCC) 03/25/2021   Major depressive disorder with single episode 03/25/2021   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   Vocal cord paralysis 03/25/2021   Diabetic peripheral neuropathy (HCC) 12/25/2020   Orthopnea 12/16/2020   OSA on CPAP 09/09/2020   Sciatica of left side 07/11/2019   Cough 07/11/2019   Edema leg 10/26/2018   History of stroke 10/26/2018   DOE (dyspnea on exertion) 10/26/2018   Left hip pain 07/14/2018   Acute left ankle pain 07/14/2018   Chest pain 07/14/2018   Nonintractable headache 02/24/2018   CKD stage 3b, GFR 30-44 ml/min (HCC)  02/22/2017   Anemia of chronic disease 02/16/2017   Chronic diastolic CHF (congestive heart failure) (HCC) 06/29/2016   LBBB (left bundle branch block) 02/02/2016   Nonallopathic lesion of lumbosacral region 12/11/2014   Nonallopathic lesion of sacral region 12/11/2014   Nonallopathic lesion of thoracic region 12/11/2014   Arthritis of left hip 11/20/2014   Ischial bursitis of left side 10/28/2014   Hamstring tightness of left lower extremity 09/16/2014   Piriformis syndrome of left side 09/16/2014   Dysphonia 06/22/2013   Hemiparesis affecting left side as late effect of stroke (HCC) 10/10/2012   Dysphagia following cerebrovascular accident 08/14/2012   Chronic back pain    Peripheral neuropathy (HCC)    TIA (transient ischemic attack) 04/08/2012   Stroke (HCC) 02/21/2012   MORTON'S NEUROMA, RIGHT 05/29/2010   DM2 (diabetes mellitus, type 2) (HCC) 04/13/2010   Allergic rhinitis 04/13/2010   GERD (gastroesophageal reflux disease) 04/13/2010   ERECTILE DYSFUNCTION 03/12/2009   Essential hypertension 03/12/2009   CAD S/P percutaneous coronary angioplasty 03/12/2009   Hyperlipidemia 03/07/2009    ONSET DATE: 11/02/2024 (hospital admission)  REFERRING DIAG: Z86.73 (ICD-10-CM) - History of stroke M25.552 (ICD-10-CM) - Left hip pain  THERAPY DIAG:  Abnormal posture  Difficulty in walking, not elsewhere classified  Muscle weakness (generalized)  Other lack of coordination  Unsteadiness on feet  Other abnormalities of gait and mobility  Rationale for Evaluation and Treatment: Rehabilitation  SUBJECTIVE:                                                                                                                                                                                             SUBJECTIVE STATEMENT: [name]  Pt reports he had LLE pain for a couple weeks prior to admission to hospital on 11/02/2024.  He reports his leg pain is getting better.  He acknowledges  following hernia repair precautions and has not had any falls.  He is still using his 2WW full-time while he heals, but he would like to get back to his cane.  When he fell prior to the hospital, he was ambulating back from the restroom in his home and had a sharp pain across his left thigh that caused him to lose his balance and fall backwards.  Pt accompanied by: friend (drove him - waits outside) - pt usually drives himself  PERTINENT HISTORY: DM2, HTN, R Morton's neuroma, CAD s/p stent, GERD, TIA, CVA (right thalamic/posterior limb internal capsule) w/ L hemi Sept 2013, chronic LBP, dysphagia, LBBB, chronic diastolic CHF, CKD stage 3b  Admitted to [facility] 12/12-12/16 due to left thigh and groin pain s/p fall.  Inguinal hernia repair w/ mesh placement 11/05/24.  PAIN:  Are you having pain? Yes: NPRS scale: 8 Pain location: just above the left iliac crest and down to groin and medial left thigh Pain description: sharp needle mostly, intermittently dull Aggravating factors: constant Relieving factors: constant  PRECAUTIONS: Fall and Other: inguinal hernia repair - no straining, pushing/pulling or lifting over 5 lbs  RED FLAGS: None   WEIGHT BEARING RESTRICTIONS: No  FALLS: Has patient fallen in last 6 months? Yes. Number of falls 1- prior to hospital visit  LIVING ENVIRONMENT: Lives with: lives with their family (dtr and 4 grandchildren) Lives in: House/apartment Stairs: Yes: Internal: 15 steps; on left going up and External: 1 STE steps; none Has following equipment at home: Single point cane, Walker - 2 wheeled, Wheelchair (manual), shower chair, Shower bench, and Grab bars  PLOF: Independent with basic ADLs, Independent with household mobility with device, Independent with community mobility with device, Independent with transfers, Requires assistive device for independence, and Needs assistance with homemaking  PATIENT GOALS: to start walking a lot better than I am now  to stop having a balance problem  OBJECTIVE:  Note: Objective measures were completed at Evaluation unless otherwise noted.  DIAGNOSTIC FINDINGS:  MRI Brain 11/02/2024 IMPRESSION: 1. No acute intracranial abnormality. 2. Chronic right thalamic infarct. 3. Moderate chronic small vessel ischemic disease. 4. Small chronic cerebellar infarcts.  COGNITION: Overall cognitive status: Within functional limits for tasks assessed   SENSATION: Light touch: L foot and ankle diminished/incorrect ID of location w/ repeated attempts  COORDINATION: LE RAMS:  slow and deliberate Heel-to-shin:  limited by diminished AROM and pain  EDEMA:  Pt reports more L foot and ankle swelling past couple days, but no pitting edema or visible swelling noted compared to RLE; generalized tenderness to palpation over entire LLE, no redness or warmth of LLE noted  MUSCLE TONE: LLE: Mild  POSTURE: forward head, increased thoracic kyphosis, and posterior pelvic tilt  LOWER EXTREMITY AROM/MMT:    MMT Right Eval Left Eval  Hip flexion Grossly WFL; can stand and ambulate against gravity, slow left flexion pattern and limited hip flexion in sitting.  Hip extension   Hip abduction   Hip adduction   Hip internal rotation   Hip external rotation   Knee flexion   Knee extension   Ankle dorsiflexion   Ankle plantarflexion   Ankle inversion    Ankle eversion    (Blank rows = not tested)  BED MOBILITY:  Findings: Sit to supine Complete Independence Supine to sit Complete Independence Rolling to Right Complete Independence Rolling to Left Complete Independence  TRANSFERS: Sit to stand: SBA  Assistive device utilized: Environmental Consultant - 2 wheeled     Stand  to sit: SBA  Assistive device utilized: Environmental Consultant - 2 wheeled     Chair to chair: SBA  Assistive device utilized: Environmental Consultant - 2 wheeled       RAMP:  Not tested  CURB:  Not tested  STAIRS: Not tested GAIT: Findings: Gait Characteristics: step to pattern, step  through pattern, antalgic, trunk flexed, and narrow BOS, Distance walked: various clinic distances, Assistive device utilized:Walker - 2 wheeled, Level of assistance: SBA, and Comments: No LOB or stray from pathway, mildly decreased stance time of LLE intermittently w/ pain  FUNCTIONAL TESTS:  Timed up and go (TUG): 45.91 sec 10 meter walk test: 29.66 sec = 0.34 m/sec OR 1.11 ft/sec Berg Balance Scale:  TBA    PATIENT SURVEYS:  ABC scale: The Activities-Specific Balance Confidence (ABC) Scale 0% 10 20 30  40 50 60 70 80 90 100% No confidence<->completely confident  How confident are you that you will not lose your balance or become unsteady when you . . .   Date tested 11/13/2024  Walk around the house 50%  2. Walk up or down stairs 0%  3. Bend over and pick up a slipper from in front of a closet floor 0%  4. Reach for a small can off a shelf at eye level 60%  5. Stand on tip toes and reach for something above your head 0%  6. Stand on a chair and reach for something 0%  7. Sweep the floor 50%  8. Walk outside the house to a car parked in the driveway 90%  9. Get into or out of a car 90%  10. Walk across a parking lot to the mall 30%  11. Walk up or down a ramp 90%  12. Walk in a crowded mall where people rapidly walk past you 70%  13. Are bumped into by people as you walk through the mall 90%  14. Step onto or off of an escalator while you are holding onto the railing 0%  15. Step onto or off an escalator while holding onto parcels such that you cannot hold onto the railing 0%  16. Walk outside on icy sidewalks 0%  Total: #/16 620/16 = 38.75 %                                                                                                                                 TREATMENT DATE: 11/13/2024    PATIENT EDUCATION: Education details: PT POC, assessments used and to be used.  S/s of DVT and seeking emergent evaluation at ED.  Wearing compression stockings for swelling.   Reviewed precautions for hernia repair.  Discussed chronic CVA deficits and goal of PT.  Densensitization techniques for the LLE.  Standing/weight bearing/weight shifting for spasm and spasticity management.  Vibration foot board and using it for sensitivity in feet and distraction as desired.  Working on walking tolerance - walking program.  Proper STS - anterior weight shift  vs leaning backwards on push to stand. Person educated: Patient Education method: Explanation, Demonstration, Verbal cues, and Handouts Education comprehension: verbalized understanding and needs further education  HOME EXERCISE PROGRAM: Desensitization Techniques: -Light touch/pressure:  using fingertip pressure to slowly move up small segments of the affected body part until outside the area of pain and then back to where you started -Deep pressure:  using fingertips to squeeze in small segments starting outside the affected area, crossing over the affected area, and then to the other side of the affected area -Tapping:  fingertips tap with firm pressure over the affected area, can move around the affected area as well -Brushing/scratching:  use fingertips to brush/scratch over and around the affected area -Textures:  use soft and rough textured items like cotton balls vs wash cloths to brush or rub with light into firm pressure over and around the affected area  Repeat these 3-4x per day.  You Can Walk For A Certain Length Of Time Each Day (use most appropriate assistive device - as of 12/23 use the walker)                          Walk 2-3 minutes 3-4 times per day.             Increase 1-2  minutes every week             Work up to 20 minutes (at least 1x a day).               Example:                         Day 1-2           4-5 minutes     3 times per day                         Day 7-8           10-12 minutes 2-3 times per day                         Day 13-14       20-22 minutes 1-2 times per  day   GOALS: Goals reviewed with patient? Yes  SHORT TERM GOALS: Target date: 12/15/2023  Pt will be independent and compliant with initial strength and balance HEP in order to maintain functional progress and improve mobility. Baseline:  Established on eval. Goal status: INITIAL  2.  Patient will be compliant to formal walking program >/= 4 days per week to improve aerobic tolerance and ambulatory mechanics. Baseline: Established on eval. Goal status: INITIAL  3.  Pt will demonstrate TUG of </=35.91 seconds in order to decrease risk of falls and improve functional mobility using LRAD. Baseline: 45.91 sec Goal status: INITIAL  4.  Pt will demonstrate a gait speed of >/= 0.44 m/sec in order to decrease risk for falls. Baseline: 0.34 m/sec Goal status: INITIAL  5.  Pt will independent with desensitization techniques for chronic pain. Baseline: Provided on eval. Goal status: INITIAL  6.  BERG to be assessed w/ STG set as appropriate. Baseline: TBA Goal status: INITIAL  LONG TERM GOALS: Target date: 01/11/2025  Pt will be independent and compliant with advanced and finalized strength and balance HEP in order to maintain functional progress and improve mobility. Baseline: Established on eval. Goal status: INITIAL  2.  Pt will ambulate >/=50 feet with SPC and no more than SBA level of assist to promote household and community access. Baseline: Cane level prior to fall, 2WW currently Goal status: INITIAL  3.  Pt will demonstrate TUG of </=25.91 seconds in order to decrease risk of falls and improve functional mobility using LRAD. Baseline: 45.91 sec Goal status: INITIAL  4.  Pt will demonstrate a gait speed of >/= 0.54 m/sec in order to decrease risk for falls. Baseline: 0.34 m/sec Goal status: INITIAL  5.  Patient will be compliant to formal walking program >/= 6 days per week to improve aerobic tolerance and ambulatory mechanics. Baseline: Established on eval. Goal  status: INITIAL  6.  BERG to be assessed w/ LTG set as appropriate. Baseline: TBA Goal status: INITIAL  7.  Pt will improve ABC Scale to >/=48.75% in order to demonstrate decreased fear of falling and fall risk. Baseline: 38.75% Goal status: INITIAL  ASSESSMENT:  CLINICAL IMPRESSION: Patient is a 73 y.o. male who was seen today for physical therapy evaluation and treatment for left lower extremity pain s/p hospitalization for fall with left inguinal hernia repair on 11/05/2024.  Pt has a significant PMH of DM2, HTN, R Morton's neuroma, CAD s/p stent, GERD, TIA, CVA (right thalamic/posterior limb internal capsule) w/ L hemi Sept 2013, chronic LBP, dysphagia, LBBB, chronic diastolic CHF, and CKD stage 3b.  Identified impairments include pain in LLE and difficulty with hip flexion and ER, poor coordination, slow gait, reliance on 2WW, decreased sensation in distal LLE, and generally poor posture in sitting and standing.  Evaluation via the following assessment tools: TUG and indicate fall risk.  BERG to be assessed further at coming visit.  He would benefit from skilled PT to address impairments as noted and progress towards long term goals.  OBJECTIVE IMPAIRMENTS: Abnormal gait, decreased activity tolerance, decreased balance, decreased coordination, decreased endurance, decreased knowledge of use of DME, decreased mobility, difficulty walking, decreased ROM, decreased strength, decreased safety awareness, increased edema, increased muscle spasms, impaired sensation, impaired tone, impaired UE functional use, improper body mechanics, postural dysfunction, and pain.   ACTIVITY LIMITATIONS: carrying, lifting, bending, standing, squatting, stairs, transfers, bed mobility, reach over head, and locomotion level  PARTICIPATION LIMITATIONS: meal prep, cleaning, driving, shopping, and community activity  PERSONAL FACTORS: Age, Fitness, Past/current experiences, Time since onset of  injury/illness/exacerbation, Transportation, and 1-2 comorbidities: extensive cardiac hx, prior CVA w/ L hemiparesis and chronic pain are also affecting patient's functional outcome.   REHAB POTENTIAL: Good  CLINICAL DECISION MAKING: Evolving/moderate complexity  EVALUATION COMPLEXITY: Moderate  PLAN:  PT FREQUENCY: 1x/week  PT DURATION: 8 weeks  PLANNED INTERVENTIONS: 97164- PT Re-evaluation, 97750- Physical Performance Testing, 97110-Therapeutic exercises, 97530- Therapeutic activity, [id number]- Neuromuscular re-education, 97535- Self Care, [id number]- Manual therapy, [id number]- Gait training, [phone]- Aquatic Therapy, [phone]- Electrical stimulation (manual), Patient/Family education, Balance training, Stair training, Taping, Joint mobilization, Vestibular training, DME instructions, Cryotherapy, and Moist heat  PLAN FOR NEXT SESSION: ASSESS BERG - set goals as appropriate.  Establish introductory strengthening HEP for BLE, work on gentle core w/o straining - PPT, supine marching/bracing w/o holding breath, left hip flexor  NO weight > 5lbs.   Daved KATHEE Bull, PT, DPT 11/13/2024, 11:49 AM        "

## 2024-11-16 ENCOUNTER — Ambulatory Visit: Payer: Self-pay

## 2024-11-16 NOTE — Telephone Encounter (Signed)
 FYI Only or Action Required?: FYI only for provider: appointment scheduled on 11/28/24.  Patient was last seen in primary care on 01/18/2024 by Geofm Glade PARAS, MD.  Called Nurse Triage reporting Post-op Problem.  Symptoms began surgery 11/05/25.  Interventions attempted: Nothing.  Symptoms are: unchanged.  Triage Disposition: See Within 2 Weeks in Office (overriding Home Care)  Patient/caregiver understands and will follow disposition?:   Copied from CRM [id number]. Topic: Clinical - Red Word Triage >> Nov 16, 2024 11:49 AM Viola FALCON wrote: Red Word that prompted transfer to Nurse Triage: Patient was seen 11/02/2024 - 11/06/2024 [facility] - still having soreness and swelling in the area of hernia surgery Reason for Disposition  Skin tape (e.g., Steri-Strips) removal, questions about  Answer Assessment - Initial Assessment Questions 1. SYMPTOM: What's the main symptom you're concerned about? (e.g., drainage, incision opened up, pain, redness)  Calling to make f/u appt. Soreness mild swelling at site. No changes since surgery. Denies drainage, spreading redness.  Protocols used: Post-Op Incision Symptoms and Questions-A-AH

## 2024-11-20 ENCOUNTER — Ambulatory Visit: Admitting: Physical Therapy

## 2024-11-25 ENCOUNTER — Encounter: Payer: Self-pay | Admitting: Internal Medicine

## 2024-11-25 DIAGNOSIS — K869 Disease of pancreas, unspecified: Secondary | ICD-10-CM | POA: Insufficient documentation

## 2024-11-25 NOTE — Patient Instructions (Incomplete)
" ° ° ° ° °  Medications changes include :   doxycycline  for your cold. Resume losartan  100 mg daily    A MRI of your pancreas was ordered and someone will call you to schedule an appointment.     Return if symptoms worsen or fail to improve.  "

## 2024-11-25 NOTE — Progress Notes (Unsigned)
 "     Subjective:    Patient ID: [name], male    DOB: [birth year], 74 y.o.   MRN: [id number]     HPI [name] is here for follow up from the hospital  Presented to ED 12/12 after fall with complaints of left leg pain  - left thigh, left hip and left knee.  Left thigh is tender to touch.  Xrays negative.  US  of leg neg for DVT.  Received pain medications, muscle relaxer.   He also complained of left inguinal pain and found to have inguinal hernia that was not reducible.  Surgery consulted.  Had hernia repair with mesh placement. Outpatient PT ordered.    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     Medications and allergies reviewed with patient and updated if appropriate.  Medications Ordered Prior to Encounter[1]   Review of Systems     Objective:  There were no vitals filed for this visit. BP Readings from Last 3 Encounters:  11/13/24 (!) 140/68  11/06/24 (!) 132/90  10/01/24 130/80   Wt Readings from Last 3 Encounters:  11/06/24 210 lb 8.6 oz (95.5 kg)  10/01/24 216 lb 9.6 oz (98.2 kg)  07/31/24 214 lb 3.2 oz (97.2 kg)   There is no height or weight on file to calculate BMI.    Physical Exam     Lab Results  Component Value Date   WBC 7.5 11/05/2024   HGB 10.0 (L) 11/05/2024   HCT 29.8 (L) 11/05/2024   PLT 153 11/05/2024   GLUCOSE 146 (H) 11/05/2024   CHOL 113 01/18/2024   TRIG 79.0 01/18/2024   HDL 55.10 01/18/2024   LDLDIRECT 63.0 01/12/2016   LDLCALC 42 01/18/2024   ALT 17 11/05/2024   AST 20 11/05/2024   NA 139 11/05/2024   K 4.3 11/05/2024   CL 106 11/05/2024   CREATININE 2.02 (H) 11/05/2024   BUN 22 11/05/2024   CO2 28 11/05/2024   TSH 2.449 11/02/2024   PSA 0.14 08/17/2016   INR 1.2 07/15/2023   HGBA1C 7.6 (H) 11/03/2024   MICROALBUR 18.3 (H) 01/18/2024     Assessment & Plan:    See Problem List for Assessment and Plan of chronic medical problems.          [1] Current Outpatient Medications on File Prior to Visit  Medication Sig Dispense Refill   acetaminophen  (TYLENOL ) 500 MG tablet Take 1 tablet (500 mg total) by mouth every 6 (six) hours as needed for mild pain (pain score 1-3).     aspirin  EC 81 MG tablet Take 1 tablet (81 mg total) by mouth daily. Swallow whole. 30 tablet 11   atorvastatin  (LIPITOR) 40 MG tablet TAKE 1 TABLET EVERY DAY 90 tablet 3   Blood Pressure Monitoring (BLOOD PRESSURE CUFF) MISC 1 Units by Does not apply route daily. 1 each 0   budesonide -formoterol  (SYMBICORT ) 80-4.5 MCG/ACT inhaler 2 puffs BID and prn shortness of breath or wheezing (Patient not taking: Reported on 11/02/2024) 1 each 0   calcium  carbonate (TUMS - DOSED IN MG ELEMENTAL CALCIUM ) 500 MG chewable tablet Chew 1 tablet by mouth daily as needed for indigestion or heartburn.     carvedilol  (COREG ) 25 MG tablet TAKE 1 TABLET TWICE DAILY 180 tablet 1   clopidogrel  (PLAVIX ) 75 MG tablet TAKE 1 TABLET EVERY DAY 90 tablet 1   dextrose  (GLUCO TO GO 15) 40 % GEL Take 1  Tube by mouth once as needed for low blood sugar.     fluticasone  (FLONASE ) 50 MCG/ACT nasal spray Place 2 sprays into both nostrils daily. (Patient taking differently: Place 2 sprays into both nostrils daily as needed.) 16 g 0   furosemide  (LASIX ) 40 MG tablet TAKE 1 TABLET (40 MG TOTAL) BY MOUTH DAILY. NEED APPT. (Patient not taking: Reported on 11/02/2024) 90 tablet 3   glucose blood (ACCU-CHEK AVIVA PLUS) test strip uad tid 100 each 12   insulin  glargine (LANTUS  SOLOSTAR) 100 UNIT/ML Solostar Pen Inject 7-8 Units into the skin daily. (Patient taking differently: Inject 7 Units into the skin at bedtime.)     insulin  lispro (HUMALOG  KWIKPEN) 100 UNIT/ML KwikPen Inject 6-10 Units into the skin 2 (two) times daily before a meal. 30 mL 3   Insulin  Pen Needle 32G X 4 MM MISC Use 4x a day 300 each 3   [Paused] losartan  (COZAAR ) 100 MG tablet TAKE 1 TABLET EVERY DAY 90 tablet 3    Menthol , Topical Analgesic, 4 % GEL Apply 1 application  topically daily as needed.     Microlet Lancets MISC UAD to check sugars TID.  E11.22 270 each 3   nitroGLYCERIN  (NITROSTAT ) 0.4 MG SL tablet Place 1 tablet (0.4 mg total) under the tongue every 5 (five) minutes as needed for chest pain (up to 3 doses). 25 tablet 3   oxyCODONE  (OXY IR/ROXICODONE ) 5 MG immediate release tablet Take 1 tablet (5 mg total) by mouth every 6 (six) hours as needed for moderate pain (pain score 4-6) or severe pain (pain score 7-10). 20 tablet 0   pantoprazole  (PROTONIX ) 40 MG tablet Take 1 tablet (40 mg total) by mouth daily at 6 (six) AM. 30 tablet 0   senna-docusate (SENOKOT-S) 8.6-50 MG per tablet Take 2 tablets by mouth 2 (two) times daily. For constipation. (Patient not taking: Reported on 11/02/2024)     Spacer/Aero-Holding Chambers DEVI 1 each by Does not apply route as needed. 1 each 0   No current facility-administered medications on file prior to visit.  "

## 2024-11-27 ENCOUNTER — Ambulatory Visit: Attending: Internal Medicine | Admitting: Physical Therapy

## 2024-11-27 ENCOUNTER — Encounter: Payer: Self-pay | Admitting: Physical Therapy

## 2024-11-27 DIAGNOSIS — R2689 Other abnormalities of gait and mobility: Secondary | ICD-10-CM | POA: Diagnosis present

## 2024-11-27 DIAGNOSIS — R278 Other lack of coordination: Secondary | ICD-10-CM | POA: Insufficient documentation

## 2024-11-27 DIAGNOSIS — R293 Abnormal posture: Secondary | ICD-10-CM | POA: Diagnosis present

## 2024-11-27 DIAGNOSIS — R2681 Unsteadiness on feet: Secondary | ICD-10-CM | POA: Diagnosis present

## 2024-11-27 DIAGNOSIS — R262 Difficulty in walking, not elsewhere classified: Secondary | ICD-10-CM | POA: Diagnosis present

## 2024-11-27 DIAGNOSIS — M6281 Muscle weakness (generalized): Secondary | ICD-10-CM | POA: Insufficient documentation

## 2024-11-27 NOTE — Therapy (Signed)
 " OUTPATIENT PHYSICAL THERAPY NEURO TREATMENT   Patient Name: [name] MRN: [id number] DOB:08/05/1951, 74 y.o., male Today's Date: 11/27/2024   PCP: Geofm Glade PARAS, MD REFERRING PROVIDER: Cherlyn Labella, MD  END OF SESSION:  PT End of Session - 11/27/24 1154     Visit Number 2    Number of Visits 9   8 + 1   Date for Recertification  01/18/25   pushed out due to holidays   Authorization Type [employer]    Progress Note Due on Visit 10    PT Start Time 1149    PT Stop Time 1229    PT Time Calculation (min) 40 min    Equipment Utilized During Treatment Gait belt    Activity Tolerance Patient tolerated treatment well    Behavior During Therapy WFL for tasks assessed/performed          Past Medical History:  Diagnosis Date   ALLERGIC RHINITIS    CAD, NATIVE VESSEL    BMS to OM1 2001, DES to BMS 2005   Chronic back pain    herniated disc   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   Chronic kidney disease    Constipation    takes Carafate  four times day   DIAB W/UNSPEC COMP TYPE II/UNSPEC TYPE UNCNTRL    ERECTILE DYSFUNCTION    GERD    HYPERLIPIDEMIA-MIXED    HYPERTENSION, BENIGN    LBBB (left bundle branch block) 02/02/2016   MORTON'S NEUROMA, RIGHT    OSA on CPAP 09/09/2020   Peripheral neuropathy    Seasonal allergies    takes Allegra and Benadryl  daily prn;uses Flonase  daily   SHOULDER PAIN, RIGHT    Snoring 09/09/2020   Stroke, 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, s/p CIR    TIA on medication 02/2012   Unstable angina (HCC) 07/09/2020   Past Surgical History:  Procedure Laterality Date   ANGIOPLASTY     COLONOSCOPY     CORONARY ANGIOPLASTY  2005   2 stents   CORONARY STENT INTERVENTION N/A 07/10/2020   Procedure: CORONARY STENT INTERVENTION;  Surgeon: Jordan, Peter M, MD;  Location: [facility];  Service: Cardiovascular;  Laterality: N/A;   DENTAL SURGERY     INGUINAL HERNIA REPAIR Left  11/05/2024   Procedure: REPAIR, HERNIA, INGUINAL, ADULT;  Surgeon: Ebbie Cough, MD;  Location: [facility] OR;  Service: General;  Laterality: Left;   LARYNGOPLASTY  08/07/2012   Procedure: LARYNGOPLASTY;  Surgeon: Ida Loader, MD;  Location: [facility] OR;  Service: ENT;  Laterality: Left;  Left Vocal Cord Medialyzation   LEFT HEART CATH AND CORONARY ANGIOGRAPHY N/A 07/10/2020   Procedure: LEFT HEART CATH AND CORONARY ANGIOGRAPHY;  Surgeon: Jordan, Peter M, MD;  Location: [facility] INVASIVE CV LAB;  Service: Cardiovascular;  Laterality: N/A;   left knee surgury     x 2    stent  2001, 2004   coronary stents   Patient Active Problem List   Diagnosis Date Noted   Lesion of pancreas 11/25/2024   Left thigh pain 11/02/2024   Rash and other nonspecific skin eruption 01/18/2024   Right-sided face pain 01/18/2024   Conjunctivitis 12/16/2023   Near syncope 12/15/2023   Dehydration 12/15/2023   Plantar fasciitis of left foot 11/09/2023   Pancreatic mass -MRI for follow-up due 06/2024 07/19/2023   Calculus of gallbladder without cholecystitis without obstruction 07/19/2023   Constipation 07/19/2023   Intractable nausea and vomiting 07/14/2023   Epigastric  pain 07/14/2023   Transaminitis 07/14/2023   Back pain 03/04/2023   Diarrhea 06/09/2022   Ganglion cyst of wrist, left 12/14/2021   Urinary frequency 11/06/2021   Hoarse 03/25/2021   Moderate episode of recurrent major depressive disorder (HCC) 03/25/2021   Major depressive disorder with single episode 03/25/2021   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   Vocal cord paralysis 03/25/2021   Diabetic peripheral neuropathy (HCC) 12/25/2020   Orthopnea 12/16/2020   OSA on CPAP 09/09/2020   Sciatica of left side 07/11/2019   Cough 07/11/2019   Edema leg 10/26/2018   History of stroke 10/26/2018   DOE (dyspnea on exertion) 10/26/2018   Left hip pain 07/14/2018   Acute left ankle pain 07/14/2018   Chest pain 07/14/2018   Nonintractable headache 02/24/2018   CKD  stage 3b, GFR 30-44 ml/min (HCC) 02/22/2017   Anemia of chronic disease 02/16/2017   Chronic diastolic CHF (congestive heart failure) (HCC) 06/29/2016   LBBB (left bundle branch block) 02/02/2016   Nonallopathic lesion of lumbosacral region 12/11/2014   Nonallopathic lesion of sacral region 12/11/2014   Nonallopathic lesion of thoracic region 12/11/2014   Arthritis of left hip 11/20/2014   Ischial bursitis of left side 10/28/2014   Hamstring tightness of left lower extremity 09/16/2014   Piriformis syndrome of left side 09/16/2014   Dysphonia 06/22/2013   Hemiparesis affecting left side as late effect of stroke (HCC) 10/10/2012   Dysphagia following cerebrovascular accident 08/14/2012   Chronic back pain    Peripheral neuropathy (HCC)    TIA (transient ischemic attack) 04/08/2012   Stroke (HCC) 02/21/2012   MORTON'S NEUROMA, RIGHT 05/29/2010   Type 2 diabetes mellitus with hyperlipidemia (HCC) 04/13/2010   Allergic rhinitis 04/13/2010   GERD (gastroesophageal reflux disease) 04/13/2010   ERECTILE DYSFUNCTION 03/12/2009   Hypertension associated with type 2 diabetes mellitus (HCC) 03/12/2009   CAD S/P percutaneous coronary angioplasty 03/12/2009   Hyperlipidemia associated with type 2 diabetes mellitus (HCC) 03/07/2009    ONSET DATE: 11/02/2024 (hospital admission)  REFERRING DIAG: Z86.73 (ICD-10-CM) - History of stroke M25.552 (ICD-10-CM) - Left hip pain  THERAPY DIAG:  Abnormal posture  Difficulty in walking, not elsewhere classified  Muscle weakness (generalized)  Other lack of coordination  Unsteadiness on feet  Other abnormalities of gait and mobility  Rationale for Evaluation and Treatment: Rehabilitation  SUBJECTIVE:                                                                                                                                                                                             SUBJECTIVE STATEMENT: [name]  Pt reports ongoing left  hip/groin pain.  He is ambulating w/ 2WW today.  Pt accompanied by: friend (drove him - waits outside) - pt usually drives himself  PERTINENT HISTORY: DM2, HTN, R Morton's neuroma, CAD s/p stent, GERD, TIA, CVA (right thalamic/posterior limb internal capsule) w/ L hemi Sept 2013, chronic LBP, dysphagia, LBBB, chronic diastolic CHF, CKD stage 3b  Admitted to [facility] hospital 12/12-12/16 due to left thigh and groin pain s/p fall.  Inguinal hernia repair w/ mesh placement 11/05/24.  PAIN:  Are you having pain? Yes: NPRS scale: 9 Pain location: just above the left iliac crest and down to groin and medial left thigh Pain description: sharp needle mostly, intermittently dull Aggravating factors: constant Relieving factors: constant  PRECAUTIONS: Fall and Other: inguinal hernia repair - no straining, pushing/pulling or lifting over 5 lbs  RED FLAGS: None   WEIGHT BEARING RESTRICTIONS: No  FALLS: Has patient fallen in last 6 months? Yes. Number of falls 1- prior to hospital visit  LIVING ENVIRONMENT: Lives with: lives with their family (dtr and 4 grandchildren) Lives in: House/apartment Stairs: Yes: Internal: 15 steps; on left going up and External: 1 STE steps; none Has following equipment at home: Single point cane, Walker - 2 wheeled, Wheelchair (manual), shower chair, Shower bench, and Grab bars  PLOF: Independent with basic ADLs, Independent with household mobility with device, Independent with community mobility with device, Independent with transfers, Requires assistive device for independence, and Needs assistance with homemaking  PATIENT GOALS: to start walking a lot better than I am now to stop having a balance problem  OBJECTIVE:  Note: Objective measures were completed at Evaluation unless otherwise noted.  DIAGNOSTIC FINDINGS:  MRI Brain 11/02/2024 IMPRESSION: 1. No acute intracranial abnormality. 2. Chronic right thalamic infarct. 3. Moderate chronic small vessel  ischemic disease. 4. Small chronic cerebellar infarcts.  COGNITION: Overall cognitive status: Within functional limits for tasks assessed   SENSATION: Light touch: L foot and ankle diminished/incorrect ID of location w/ repeated attempts  COORDINATION: LE RAMS:  slow and deliberate Heel-to-shin:  limited by diminished AROM and pain  EDEMA:  Pt reports more L foot and ankle swelling past couple days, but no pitting edema or visible swelling noted compared to RLE; generalized tenderness to palpation over entire LLE, no redness or warmth of LLE noted  MUSCLE TONE: LLE: Mild  POSTURE: forward head, increased thoracic kyphosis, and posterior pelvic tilt  LOWER EXTREMITY AROM/MMT:    MMT Right Eval Left Eval  Hip flexion Grossly WFL; can stand and ambulate against gravity, slow left flexion pattern and limited hip flexion in sitting.  Hip extension   Hip abduction   Hip adduction   Hip internal rotation   Hip external rotation   Knee flexion   Knee extension   Ankle dorsiflexion   Ankle plantarflexion   Ankle inversion    Ankle eversion    (Blank rows = not tested)  BED MOBILITY:  Findings: Sit to supine Complete Independence Supine to sit Complete Independence Rolling to Right Complete Independence Rolling to Left Complete Independence  TRANSFERS: Sit to stand: SBA  Assistive device utilized: Environmental Consultant - 2 wheeled     Stand to sit: SBA  Assistive device utilized: Environmental Consultant - 2 wheeled     Chair to chair: SBA  Assistive device utilized: Environmental Consultant - 2 wheeled       RAMP:  Not tested  CURB:  Not tested  STAIRS: Not tested GAIT: Findings: Gait Characteristics: step to pattern, step through pattern, antalgic, trunk flexed, and narrow BOS,  Distance walked: various clinic distances, Assistive device utilized:Walker - 2 wheeled, Level of assistance: SBA, and Comments: No LOB or stray from pathway, mildly decreased stance time of LLE intermittently w/ pain  FUNCTIONAL TESTS:   Timed up and go (TUG): 45.91 sec 10 meter walk test: 29.66 sec = 0.34 m/sec OR 1.11 ft/sec Berg Balance Scale:  TBA    PATIENT SURVEYS:  ABC scale: The Activities-Specific Balance Confidence (ABC) Scale 0% 10 20 30  40 50 60 70 80 90 100% No confidence<->completely confident  How confident are you that you will not lose your balance or become unsteady when you . . .   Date tested 11/13/2024  Walk around the house 50%  2. Walk up or down stairs 0%  3. Bend over and pick up a slipper from in front of a closet floor 0%  4. Reach for a small can off a shelf at eye level 60%  5. Stand on tip toes and reach for something above your head 0%  6. Stand on a chair and reach for something 0%  7. Sweep the floor 50%  8. Walk outside the house to a car parked in the driveway 90%  9. Get into or out of a car 90%  10. Walk across a parking lot to the mall 30%  11. Walk up or down a ramp 90%  12. Walk in a crowded mall where people rapidly walk past you 70%  13. Are bumped into by people as you walk through the mall 90%  14. Step onto or off of an escalator while you are holding onto the railing 0%  15. Step onto or off an escalator while holding onto parcels such that you cannot hold onto the railing 0%  16. Walk outside on icy sidewalks 0%  Total: #/16 620/16 = 38.75 %                                                                                                                                 TREATMENT DATE: 11/27/2024 BERG:  [facility] PT Assessment - 11/27/24 1157       Balance   Balance Assessed Yes      Standardized Balance Assessment   Standardized Balance Assessment Berg Balance Test      Berg Balance Test   Sit to Stand Able to stand  independently using hands    Standing Unsupported Able to stand 30 seconds unsupported   38.88 sec before needing hand support/foot readjustment   Sitting with Back Unsupported but Feet Supported on Floor or Stool Able to sit safely and securely 2  minutes    Stand to Sit Controls descent by using hands    Transfers Able to transfer safely, definite need of hands    Standing Unsupported with Eyes Closed Able to stand 10 seconds with supervision    Standing Unsupported with Feet Together Needs help to attain position and unable to hold for  15 seconds   34.44 sec, needs 2WW to place feet   From Standing, Reach Forward with Outstretched Arm Can reach forward >12 cm safely (5)    From Standing Position, Pick up Object from Floor Able to pick up shoe, needs supervision    From Standing Position, Turn to Look Behind Over each Shoulder Turn sideways only but maintains balance    Turn 360 Degrees Needs close supervision or verbal cueing    Standing Unsupported, Alternately Place Feet on Step/Stool Able to complete >2 steps/needs minimal assist    Standing Unsupported, One Foot in Front Needs help to step but can hold 15 seconds    Standing on One Leg Unable to try or needs assist to prevent fall    Total Score 29    Berg comment: 29/56 = significant fall risk         -Semi-tandem (almost tandem) at countertop 2x45 sec each foot in rear working to fingertip support -Narrow stance fingertip support 3x30 seconds > narrow stance eyes closed fingertip support 2x30 seconds -Standing march w/ counter support 2x10 -STS w/ BUE support x10; cues for decreased back support and posture in standing  PATIENT EDUCATION: Education details: Please continue desensitization techniques and walking program (please progress as able - current 5 mins 3x per day).  Progress seated to standing to no recovery when doing HEP at home as long as pt feels normal/expected work level with progression.  Initial HEP - modifications for challenge and safety. Person educated: Patient Education method: Explanation, Demonstration, Verbal cues, and Handouts Education comprehension: verbalized understanding and needs further education  HOME EXERCISE PROGRAM: Desensitization  Techniques: -Light touch/pressure:  using fingertip pressure to slowly move up small segments of the affected body part until outside the area of pain and then back to where you started -Deep pressure:  using fingertips to squeeze in small segments starting outside the affected area, crossing over the affected area, and then to the other side of the affected area -Tapping:  fingertips tap with firm pressure over the affected area, can move around the affected area as well -Brushing/scratching:  use fingertips to brush/scratch over and around the affected area -Textures:  use soft and rough textured items like cotton balls vs wash cloths to brush or rub with light into firm pressure over and around the affected area  Repeat these 3-4x per day.  You Can Walk For A Certain Length Of Time Each Day (use most appropriate assistive device - as of 12/23 use the walker)                          Walk 2-3 minutes 3-4 times per day.             Increase 1-2  minutes every week             Work up to 20 minutes (at least 1x a day).               Example:                         Day 1-2           4-5 minutes     3 times per day                         Day 7-8  10-12 minutes 2-3 times per day                         Day 13-14       20-22 minutes 1-2 times per day  *LARGE FONT per pt request! Access Code: [email or web address] URL: [email or web address] Date: 11/27/2024 Prepared by: Daved Bull  Exercises - Semi-Tandem Balance at [employer] Open  - 1 x daily - 4 x weekly - 1 sets - 2 reps - 45 seconds hold - Feet Together Balance at [employer] Eyes Closed  - 1 x daily - 4 x weekly - 1 sets - 2 reps - 20 seconds hold - Narrow Stance with Counter Support  - 1 x daily - 4 x weekly - 1 sets - 2 reps - 30 seconds hold - Standing March with Counter Support  - 1 x daily - 4 x weekly - 2 sets - 10 reps - Sit to Stand with Armchair  - 1 x daily - 7 x weekly - 1-2 sets - 10  reps  GOALS: Goals reviewed with patient? Yes  SHORT TERM GOALS: Target date: 12/15/2023  Pt will be independent and compliant with initial strength and balance HEP in order to maintain functional progress and improve mobility. Baseline:  Established on eval. Goal status: INITIAL  2.  Patient will be compliant to formal walking program >/= 4 days per week to improve aerobic tolerance and ambulatory mechanics. Baseline: Established on eval. Goal status: INITIAL  3.  Pt will demonstrate TUG of </=35.91 seconds in order to decrease risk of falls and improve functional mobility using LRAD. Baseline: 45.91 sec Goal status: INITIAL  4.  Pt will demonstrate a gait speed of >/= 0.44 m/sec in order to decrease risk for falls. Baseline: 0.34 m/sec Goal status: INITIAL  5.  Pt will independent with desensitization techniques for chronic pain. Baseline: Provided on eval. Goal status: INITIAL  6.  Pt will increase BERG balance score to >/=34/56 to demonstrate improved static balance. Baseline: 29/56 (1/6) Goal status: INITIAL  LONG TERM GOALS: Target date: 01/11/2025  Pt will be independent and compliant with advanced and finalized strength and balance HEP in order to maintain functional progress and improve mobility. Baseline: Established on eval. Goal status: INITIAL  2.  Pt will ambulate >/=50 feet with SPC and no more than SBA level of assist to promote household and community access. Baseline: Cane level prior to fall, 2WW currently Goal status: INITIAL  3.  Pt will demonstrate TUG of </=25.91 seconds in order to decrease risk of falls and improve functional mobility using LRAD. Baseline: 45.91 sec Goal status: INITIAL  4.  Pt will demonstrate a gait speed of >/= 0.54 m/sec in order to decrease risk for falls. Baseline: 0.34 m/sec Goal status: INITIAL  5.  Patient will be compliant to formal walking program >/= 6 days per week to improve aerobic tolerance and ambulatory  mechanics. Baseline: Established on eval. Goal status: INITIAL  6.  Pt will increase BERG balance score to >/=39/56 to demonstrate improved static balance. Baseline: 29/56 (1/6) Goal status: INITIAL  7.  Pt will improve ABC Scale to >/=48.75% in order to demonstrate decreased fear of falling and fall risk. Baseline: 38.75% Goal status: INITIAL  ASSESSMENT:  CLINICAL IMPRESSION: Focus of skilled session today initially on capturing BERG not able to be assessed on evaluation.  He scores 29/56 on this assessment today indicating increased fall risk.  He was most  challenged by reaching outside BOS, trunk mobility, and SLS.  Established introductory strength and balance HEP to address some of these deficits with plan to work on gentle core engagement next session.  Will continue per POC.  OBJECTIVE IMPAIRMENTS: Abnormal gait, decreased activity tolerance, decreased balance, decreased coordination, decreased endurance, decreased knowledge of use of DME, decreased mobility, difficulty walking, decreased ROM, decreased strength, decreased safety awareness, increased edema, increased muscle spasms, impaired sensation, impaired tone, impaired UE functional use, improper body mechanics, postural dysfunction, and pain.   ACTIVITY LIMITATIONS: carrying, lifting, bending, standing, squatting, stairs, transfers, bed mobility, reach over head, and locomotion level  PARTICIPATION LIMITATIONS: meal prep, cleaning, driving, shopping, and community activity  PERSONAL FACTORS: Age, Fitness, Past/current experiences, Time since onset of injury/illness/exacerbation, Transportation, and 1-2 comorbidities: extensive cardiac hx, prior CVA w/ L hemiparesis and chronic pain are also affecting patient's functional outcome.   REHAB POTENTIAL: Good  CLINICAL DECISION MAKING: Evolving/moderate complexity  EVALUATION COMPLEXITY: Moderate  PLAN:  PT FREQUENCY: 1x/week  PT DURATION: 8 weeks  PLANNED INTERVENTIONS:  [id number]- PT Re-evaluation, 97750- Physical Performance Testing, 97110-Therapeutic exercises, 97530- Therapeutic activity, [id number]- Neuromuscular re-education, 97535- Self Care, [id number]- Manual therapy, [id number]- Gait training, [phone]- Aquatic Therapy, [phone]- Electrical stimulation (manual), Patient/Family education, Balance training, Stair training, Taping, Joint mobilization, Vestibular training, DME instructions, Cryotherapy, and Moist heat  PLAN FOR NEXT SESSION: Progress strengthening HEP for BLE, work on gentle core w/o straining - PPT, supine marching/bracing w/o holding breath, left hip flexor  NO weight > 5lbs.   Daved KATHEE Bull, PT, DPT 11/27/2024, 12:35 PM        "

## 2024-11-27 NOTE — Patient Instructions (Signed)
*  LARGE FONT per pt request! Access Code: [email or web address] URL: [email or web address] Date: 11/27/2024 Prepared by: Daved Bull  Exercises - Semi-Tandem Balance at [employer] Open  - 1 x daily - 4 x weekly - 1 sets - 2 reps - 45 seconds hold - Feet Together Balance at [employer] Eyes Closed  - 1 x daily - 4 x weekly - 1 sets - 2 reps - 20 seconds hold - Narrow Stance with Counter Support  - 1 x daily - 4 x weekly - 1 sets - 2 reps - 30 seconds hold - Standing March with Counter Support  - 1 x daily - 4 x weekly - 2 sets - 10 reps - Sit to Stand with Armchair  - 1 x daily - 7 x weekly - 1-2 sets - 10 reps

## 2024-11-28 ENCOUNTER — Ambulatory Visit (INDEPENDENT_AMBULATORY_CARE_PROVIDER_SITE_OTHER): Admitting: Internal Medicine

## 2024-11-28 VITALS — BP 130/78 | HR 73 | Temp 98.0°F | Ht 71.0 in | Wt 216.0 lb

## 2024-11-28 DIAGNOSIS — Z8719 Personal history of other diseases of the digestive system: Secondary | ICD-10-CM | POA: Diagnosis not present

## 2024-11-28 DIAGNOSIS — J069 Acute upper respiratory infection, unspecified: Secondary | ICD-10-CM | POA: Diagnosis not present

## 2024-11-28 DIAGNOSIS — I5032 Chronic diastolic (congestive) heart failure: Secondary | ICD-10-CM

## 2024-11-28 DIAGNOSIS — E1169 Type 2 diabetes mellitus with other specified complication: Secondary | ICD-10-CM | POA: Diagnosis not present

## 2024-11-28 DIAGNOSIS — I69354 Hemiplegia and hemiparesis following cerebral infarction affecting left non-dominant side: Secondary | ICD-10-CM

## 2024-11-28 DIAGNOSIS — Z9889 Other specified postprocedural states: Secondary | ICD-10-CM

## 2024-11-28 DIAGNOSIS — E785 Hyperlipidemia, unspecified: Secondary | ICD-10-CM

## 2024-11-28 DIAGNOSIS — N1832 Chronic kidney disease, stage 3b: Secondary | ICD-10-CM

## 2024-11-28 DIAGNOSIS — K869 Disease of pancreas, unspecified: Secondary | ICD-10-CM

## 2024-11-28 DIAGNOSIS — I152 Hypertension secondary to endocrine disorders: Secondary | ICD-10-CM | POA: Diagnosis not present

## 2024-11-28 DIAGNOSIS — E1159 Type 2 diabetes mellitus with other circulatory complications: Secondary | ICD-10-CM | POA: Diagnosis not present

## 2024-11-28 MED ORDER — DOXYCYCLINE HYCLATE 100 MG PO TABS: 100.0000 mg | ORAL_TABLET | Freq: Two times a day (BID) | ORAL | 0 refills | Status: AC

## 2024-11-28 NOTE — Assessment & Plan Note (Signed)
 Acute Concern for possible bacterial infection, especially given recent hospitalization and chronic medical problems Start doxycycline  100 mg twice daily Over-the-counter cold medications for symptom relief Call if no improvement

## 2024-11-28 NOTE — Assessment & Plan Note (Signed)
 Chronic Blood pressure controlled Continue Coreg  25 mg twice daily Losartan  currently being held

## 2024-11-28 NOTE — Assessment & Plan Note (Signed)
 Chronic Lab Results  Component Value Date   LDLCALC 42 01/18/2024    LDL well-controlled Continue atorvastatin  40 mg daily

## 2024-11-28 NOTE — Assessment & Plan Note (Signed)
 Chronic Following with cardiology Appears euvolemic here today Continue current medications

## 2024-11-28 NOTE — Assessment & Plan Note (Addendum)
 Chronic Secondary to Continue aspirin  81 mg daily, Plavix  75 mg daily, atorvastatin  40 mg daily Blood pressure well-controlled Not currently taking tizanidine  2 mg at bedtime, but can restart if needed

## 2024-11-28 NOTE — Assessment & Plan Note (Signed)
 CT scan of abdomen pelvis 12/12 showed lobulated cystic lesion of the uncinate process of the pancreas, mildly enlarged since prior exam, indeterminate-MRI advised Reviewed CT scan with him MRI ordered

## 2024-11-28 NOTE — Assessment & Plan Note (Signed)
 Recent repair of left inguinal hernia 12/12 and inguinal pain-inguinal hernia that was not reducible Surgery done on 11/05/2024 Healing well Has follow-up with surgery

## 2024-11-28 NOTE — Assessment & Plan Note (Signed)
 Chronic Following with endocrine-Dr. Trixie Currently on Lantus , Humalog  Stressed diabetic diet, regular exercise  Lab Results  Component Value Date   HGBA1C 7.6 (H) 11/03/2024   Stressed diabetic diet

## 2024-11-28 NOTE — Assessment & Plan Note (Signed)
 Chronic Following with nephrology Stable

## 2024-12-04 ENCOUNTER — Encounter: Payer: Self-pay | Admitting: Physical Therapy

## 2024-12-04 ENCOUNTER — Ambulatory Visit: Admitting: Physical Therapy

## 2024-12-04 DIAGNOSIS — R2689 Other abnormalities of gait and mobility: Secondary | ICD-10-CM

## 2024-12-04 DIAGNOSIS — R293 Abnormal posture: Secondary | ICD-10-CM | POA: Diagnosis not present

## 2024-12-04 DIAGNOSIS — R2681 Unsteadiness on feet: Secondary | ICD-10-CM

## 2024-12-04 DIAGNOSIS — R262 Difficulty in walking, not elsewhere classified: Secondary | ICD-10-CM

## 2024-12-04 DIAGNOSIS — R278 Other lack of coordination: Secondary | ICD-10-CM

## 2024-12-04 DIAGNOSIS — M6281 Muscle weakness (generalized): Secondary | ICD-10-CM

## 2024-12-04 NOTE — Therapy (Signed)
 " OUTPATIENT PHYSICAL THERAPY NEURO TREATMENT   Patient Name: [name] MRN: [id number] DOB:12-25-1950, 74 y.o., male Today's Date: 12/04/2024   PCP: Mike Glade PARAS, MD REFERRING PROVIDER: Cherlyn Labella, MD  END OF SESSION:  PT End of Session - 12/04/24 1103     Visit Number 3    Number of Visits 9   8 + 1   Date for Recertification  01/18/25   pushed out due to holidays   Authorization Type [employer]    Progress Note Due on Visit 10    PT Start Time 1100    PT Stop Time 1144    PT Time Calculation (min) 44 min    Equipment Utilized During Treatment Gait belt    Activity Tolerance Patient tolerated treatment well    Behavior During Therapy WFL for tasks assessed/performed          Past Medical History:  Diagnosis Date   ALLERGIC RHINITIS    CAD, NATIVE VESSEL    BMS to OM1 2001, DES to BMS 2005   Chronic back pain    herniated disc   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   Chronic kidney disease    Constipation    takes Carafate  Mike times day   DIAB W/UNSPEC COMP TYPE II/UNSPEC TYPE UNCNTRL    ERECTILE DYSFUNCTION    GERD    HYPERLIPIDEMIA-MIXED    HYPERTENSION, BENIGN    LBBB (left bundle branch block) 02/02/2016   MORTON'S NEUROMA, RIGHT    OSA on CPAP 09/09/2020   Peripheral neuropathy    Seasonal allergies    takes Allegra and Benadryl  daily prn;uses Flonase  daily   SHOULDER PAIN, RIGHT    Snoring 09/09/2020   Stroke, 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, s/p CIR    TIA on medication 02/2012   Unstable angina (HCC) 07/09/2020   Past Surgical History:  Procedure Laterality Date   ANGIOPLASTY     COLONOSCOPY     CORONARY ANGIOPLASTY  2005   2 stents   CORONARY STENT INTERVENTION N/A 07/10/2020   Procedure: CORONARY STENT INTERVENTION;  Surgeon: Jordan, Peter M, MD;  Location: [name];  Service: Cardiovascular;  Laterality: N/A;   DENTAL SURGERY     INGUINAL HERNIA REPAIR Left  11/05/2024   Procedure: REPAIR, HERNIA, INGUINAL, ADULT;  Surgeon: Mike Cough, MD;  Location: [name];  Service: General;  Laterality: Left;   LARYNGOPLASTY  08/07/2012   Procedure: LARYNGOPLASTY;  Surgeon: Mike Loader, MD;  Location: [name];  Service: ENT;  Laterality: Left;  Left Vocal Cord Medialyzation   LEFT HEART CATH AND CORONARY ANGIOGRAPHY N/A 07/10/2020   Procedure: LEFT HEART CATH AND CORONARY ANGIOGRAPHY;  Surgeon: Jordan, Peter M, MD;  Location: [name];  Service: Cardiovascular;  Laterality: N/A;   left knee surgury     x 2    stent  2001, 2004   coronary stents   Patient Active Problem List   Diagnosis Date Noted   H/O left inguinal hernia repair 11/28/2024   Upper respiratory tract infection 11/28/2024   Lesion of pancreas 11/25/2024   Left thigh pain 11/02/2024   Right-sided face pain 01/18/2024   Conjunctivitis 12/16/2023   Near syncope 12/15/2023   Dehydration 12/15/2023   Plantar fasciitis of left foot 11/09/2023   Pancreatic mass -MRI for follow-up due 06/2024 07/19/2023   Calculus of gallbladder without cholecystitis without obstruction 07/19/2023   Constipation 07/19/2023   Intractable nausea  and vomiting 07/14/2023   Epigastric pain 07/14/2023   Transaminitis 07/14/2023   Back pain 03/04/2023   Diarrhea 06/09/2022   Ganglion cyst of wrist, left 12/14/2021   Urinary frequency 11/06/2021   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   Vocal cord paralysis 03/25/2021   Diabetic peripheral neuropathy (HCC) 12/25/2020   Orthopnea 12/16/2020   OSA on CPAP 09/09/2020   Sciatica of left side 07/11/2019   Walker 07/11/2019   Edema leg 10/26/2018   History of stroke 10/26/2018   DOE (dyspnea on exertion) 10/26/2018   Left hip pain 07/14/2018   Acute left ankle pain 07/14/2018   Chest pain 07/14/2018   Nonintractable headache 02/24/2018   CKD stage 3b, GFR 30-44 ml/min (HCC) 02/22/2017   Anemia of chronic disease 02/16/2017   Chronic diastolic heart failure  (HCC) 91/91/7982   LBBB (left bundle branch block) 02/02/2016   Nonallopathic lesion of lumbosacral region 12/11/2014   Nonallopathic lesion of sacral region 12/11/2014   Nonallopathic lesion of thoracic region 12/11/2014   Arthritis of left hip 11/20/2014   Ischial bursitis of left side 10/28/2014   Hamstring tightness of left lower extremity 09/16/2014   Piriformis syndrome of left side 09/16/2014   Dysphonia 06/22/2013   Hemiparesis affecting left side as late effect of stroke (HCC) 10/10/2012   Dysphagia following cerebrovascular accident 08/14/2012   Chronic back pain    Peripheral neuropathy (HCC)    TIA (transient ischemic attack) 04/08/2012   Stroke (HCC) 02/21/2012   MORTON'S NEUROMA, RIGHT 05/29/2010   Type 2 diabetes mellitus with hyperlipidemia (HCC) 04/13/2010   Allergic rhinitis 04/13/2010   GERD (gastroesophageal reflux disease) 04/13/2010   ERECTILE DYSFUNCTION 03/12/2009   Hypertension associated with type 2 diabetes mellitus (HCC) 03/12/2009   CAD S/P percutaneous coronary angioplasty 03/12/2009   Hyperlipidemia associated with type 2 diabetes mellitus (HCC) 03/07/2009    ONSET DATE: 11/02/2024 (hospital admission)  REFERRING DIAG: Z86.73 (ICD-10-CM) - History of stroke M25.552 (ICD-10-CM) - Left hip pain  THERAPY DIAG:  Abnormal posture  Difficulty in walking, not elsewhere classified  Muscle weakness (generalized)  Other lack of coordination  Unsteadiness on feet  Other abnormalities of gait and mobility  Rationale for Evaluation and Treatment: Rehabilitation  SUBJECTIVE:                                                                                                                                                                                             SUBJECTIVE STATEMENT: [name]  Pt reports ongoing left hip/groin pain.  He is ambulating w/ 2WW today.  He reports feeling stiff today.  He requests a mask due to recent cold  and being on  antibiotics.  Pt accompanied by: friend (drove him - waits outside) - pt usually drives himself  PERTINENT HISTORY: DM2, HTN, R Morton's neuroma, CAD s/p stent, GERD, TIA, CVA (right thalamic/posterior limb internal capsule) w/ L hemi Sept 2013, chronic LBP, dysphagia, LBBB, chronic diastolic CHF, CKD stage 3b  Admitted to [facility] hospital 12/12-12/16 due to left thigh and groin pain s/p fall.  Inguinal hernia repair w/ mesh placement 11/05/24.  PAIN:  Are you having pain? Yes: NPRS scale: 6 Pain location: just above the left iliac crest and down to groin and medial left thigh Pain description: sharp needle mostly, intermittently dull Aggravating factors: constant Relieving factors: constant  PRECAUTIONS: Fall and Other: inguinal hernia repair - no straining, pushing/pulling Walker lifting over 5 lbs  RED FLAGS: None   WEIGHT BEARING RESTRICTIONS: No  FALLS: Has patient fallen in last 6 months? Yes. Number of falls 1- prior to hospital visit  LIVING ENVIRONMENT: Lives with: lives with their family (dtr and 4 grandchildren) Lives in: House/apartment Stairs: Yes: Internal: 15 steps; on left going up and External: 1 STE steps; none Has following equipment at home: Single point cane, Walker - 2 wheeled, Wheelchair (manual), shower chair, Shower bench, and Grab bars  PLOF: Independent with basic ADLs, Independent with household mobility with device, Independent with community mobility with device, Independent with transfers, Requires assistive device for independence, and Needs assistance with homemaking  PATIENT GOALS: to start walking a lot better than I am now to stop having a balance problem  OBJECTIVE:  Note: Objective measures were completed at Evaluation unless otherwise noted.  DIAGNOSTIC FINDINGS:  MRI Brain 11/02/2024 IMPRESSION: 1. No acute intracranial abnormality. 2. Chronic right thalamic infarct. 3. Moderate chronic small vessel ischemic disease. 4. Small chronic  cerebellar infarcts.  COGNITION: Overall cognitive status: Within functional limits for tasks assessed   SENSATION: Light touch: L foot and ankle diminished/incorrect ID of location w/ repeated attempts  COORDINATION: LE RAMS:  slow and deliberate Heel-to-shin:  limited by diminished AROM and pain  EDEMA:  Pt reports more L foot and ankle swelling past couple days, but no pitting edema Walker visible swelling noted compared to RLE; generalized tenderness to palpation over entire LLE, no redness Walker warmth of LLE noted  MUSCLE TONE: LLE: Mild  POSTURE: forward head, increased thoracic kyphosis, and posterior pelvic tilt  LOWER EXTREMITY AROM/MMT:    MMT Right Eval Left Eval  Hip flexion Grossly WFL; can stand and ambulate against gravity, slow left flexion pattern and limited hip flexion in sitting.  Hip extension   Hip abduction   Hip adduction   Hip internal rotation   Hip external rotation   Knee flexion   Knee extension   Ankle dorsiflexion   Ankle plantarflexion   Ankle inversion    Ankle eversion    (Blank rows = not tested)  BED MOBILITY:  Findings: Sit to supine Complete Independence Supine to sit Complete Independence Rolling to Right Complete Independence Rolling to Left Complete Independence  TRANSFERS: Sit to stand: SBA  Assistive device utilized: Environmental Consultant - 2 wheeled     Stand to sit: SBA  Assistive device utilized: Environmental Consultant - 2 wheeled     Chair to chair: SBA  Assistive device utilized: Environmental Consultant - 2 wheeled       RAMP:  Not tested  CURB:  Not tested  STAIRS: Not tested GAIT: Findings: Gait Characteristics: step to pattern, step through pattern, antalgic, trunk flexed, and narrow BOS, Distance  walked: various clinic distances, Assistive device utilized:Walker - 2 wheeled, Level of assistance: SBA, and Comments: No LOB Walker stray from pathway, mildly decreased stance time of LLE intermittently w/ pain  FUNCTIONAL TESTS:  Timed up and go (TUG): 45.91 sec 10  meter walk test: 29.66 sec = 0.34 Walker/sec Walker 1.11 ft/sec Berg Balance Scale:  TBA    PATIENT SURVEYS:  ABC scale: The Activities-Specific Balance Confidence (ABC) Scale 0% 10 20 30  40 50 60 70 80 90 100% No confidence<->completely confident  How confident are you that you will not lose your balance Walker become unsteady when you . . .   Date tested 11/13/2024  Walk around the house 50%  2. Walk up Walker down stairs 0%  3. Bend over and pick up a slipper from in front of a closet floor 0%  4. Reach for a small can off a shelf at eye level 60%  5. Stand on tip toes and reach for something above your head 0%  6. Stand on a chair and reach for something 0%  7. Sweep the floor 50%  8. Walk outside the house to a car parked in the driveway 90%  9. Get into Walker out of a car 90%  10. Walk across a parking lot to the mall 30%  11. Walk up Walker down a ramp 90%  12. Walk in a crowded mall where people rapidly walk past you 70%  13. Are bumped into by people as you walk through the mall 90%  14. Step onto Walker off of an escalator while you are holding onto the railing 0%  15. Step onto Walker off an escalator while holding onto parcels such that you cannot hold onto the railing 0%  16. Walk outside on icy sidewalks 0%  Total: #/16 620/16 = 38.75 %                                                                                                                                 TREATMENT DATE: 12/04/2024 -Confirmed and verbally reviewed HEP/other programs established on eval. -In supine PT provides passive stretching and ROM to BLE:  hamstrings > glutes > IR/ER > adductors; consistent cues to maintain relaxed deep breathing throughout -LTRs x2 minutes -Supine bridges 2x10 -PPT x20, emphasis on breathing during tilt as pt has tendency to push down with force -Supine marching 2x20, cues to improve breathing pattern and core engagement on lower scale to avoid straining -Contralateral march and overhead reach x10  each side, cues for coordination  PATIENT EDUCATION: Education details: Please continue desensitization techniques and walking program (please progress as able - current 5 mins 3x per day).  Progress seated to standing to no recovery when doing HEP at home as long as pt feels normal/expected work level with progression.  Continue HEP w/ additions and prior modifications discussed.  Breathing patterns during activity to avoid straining. Person educated: Patient Education method: Explanation,  Demonstration, Verbal cues, and Handouts Education comprehension: verbalized understanding and needs further education  HOME EXERCISE PROGRAM: Desensitization Techniques: -Light touch/pressure:  using fingertip pressure to slowly move up small segments of the affected body part until outside the area of pain and then back to where you started -Deep pressure:  using fingertips to squeeze in small segments starting outside the affected area, crossing over the affected area, and then to the other side of the affected area -Tapping:  fingertips tap with firm pressure over the affected area, can move around the affected area as well -Brushing/scratching:  use fingertips to brush/scratch over and around the affected area -Textures:  use soft and rough textured items like cotton balls vs wash cloths to brush Walker rub with light into firm pressure over and around the affected area  Repeat these 3-4x per day.  You Can Walk For A Certain Length Of Time Each Day (use most appropriate assistive device - as of 12/23 use the walker)                          Walk 2-3 minutes 3-4 times per day.             Increase 1-2  minutes every week             Work up to 20 minutes (at least 1x a day).               Example:                         Day 1-2           4-5 minutes     3 times per day                         Day 7-8           10-12 minutes 2-3 times per day                         Day 13-14       20-22 minutes 1-2  times per day  *LARGE FONT per pt request! Access Code: [email or web address] URL: [email or web address] Date: 11/27/2024 Prepared by: Daved Bull  Exercises - Semi-Tandem Balance at [employer] Open  - 1 x daily - 4 x weekly - 1 sets - 2 reps - 45 seconds hold - Feet Together Balance at [employer] Eyes Closed  - 1 x daily - 4 x weekly - 1 sets - 2 reps - 20 seconds hold - Narrow Stance with Counter Support  - 1 x daily - 4 x weekly - 1 sets - 2 reps - 30 seconds hold - Standing March with Counter Support  - 1 x daily - 4 x weekly - 2 sets - 10 reps - Sit to Stand with Armchair  - 1 x daily - 7 x weekly - 1-2 sets - 10 reps - Supine Bridge  - 1 x daily - 4 x weekly - 2 sets - 10 reps - Supine Posterior Pelvic Tilt  - 1 x daily - 4 x weekly - 2 sets - 10 reps - Supine March  - 1 x daily - 4 x weekly - 2 sets - 10 reps  GOALS: Goals reviewed with patient? Yes  SHORT TERM GOALS: Target date: 12/15/2023  Pt will be independent and  compliant with initial strength and balance HEP in order to maintain functional progress and improve mobility. Baseline:  Established on eval. Goal status: INITIAL  2.  Patient will be compliant to formal walking program >/= 4 days per week to improve aerobic tolerance and ambulatory mechanics. Baseline: Established on eval. Goal status: INITIAL  3.  Pt will demonstrate TUG of </=35.91 seconds in order to decrease risk of falls and improve functional mobility using LRAD. Baseline: 45.91 sec Goal status: INITIAL  4.  Pt will demonstrate a gait speed of >/= 0.44 Walker/sec in order to decrease risk for falls. Baseline: 0.34 Walker/sec Goal status: INITIAL  5.  Pt will independent with desensitization techniques for chronic pain. Baseline: Provided on eval. Goal status: INITIAL  6.  Pt will increase BERG balance score to >/=34/56 to demonstrate improved static balance. Baseline: 29/56 (1/6) Goal status: INITIAL  LONG TERM GOALS: Target date:  01/11/2025  Pt will be independent and compliant with advanced and finalized strength and balance HEP in order to maintain functional progress and improve mobility. Baseline: Established on eval. Goal status: INITIAL  2.  Pt will ambulate >/=50 feet with SPC and no more than SBA level of assist to promote household and community access. Baseline: Cane level prior to fall, 2WW currently Goal status: INITIAL  3.  Pt will demonstrate TUG of </=25.91 seconds in order to decrease risk of falls and improve functional mobility using LRAD. Baseline: 45.91 sec Goal status: INITIAL  4.  Pt will demonstrate a gait speed of >/= 0.54 Walker/sec in order to decrease risk for falls. Baseline: 0.34 Walker/sec Goal status: INITIAL  5.  Patient will be compliant to formal walking program >/= 6 days per week to improve aerobic tolerance and ambulatory mechanics. Baseline: Established on eval. Goal status: INITIAL  6.  Pt will increase BERG balance score to >/=39/56 to demonstrate improved static balance. Baseline: 29/56 (1/6) Goal status: INITIAL  7.  Pt will improve ABC Scale to >/=48.75% in order to demonstrate decreased fear of falling and fall risk. Baseline: 38.75% Goal status: INITIAL  ASSESSMENT:  CLINICAL IMPRESSION: Focus of skilled session today on stretching and core engagement.  He reports improved sense of mobility following passive ROM and stretching provided by therapist at onset of session.  Will review and progress to independent stretching for home in future session.  Made additions to HEP to have pt practice gentle core engagement w/o straining.  He does have a tendency to hold his breath but is able to be coached into correct pattern and maintain with increased repetition.  His coordination appears somewhat impaired with seated exercise today and is impacted by tone so will further address in future sessions as well.  Continue per POC.  OBJECTIVE IMPAIRMENTS: Abnormal gait, decreased  activity tolerance, decreased balance, decreased coordination, decreased endurance, decreased knowledge of use of DME, decreased mobility, difficulty walking, decreased ROM, decreased strength, decreased safety awareness, increased edema, increased muscle spasms, impaired sensation, impaired tone, impaired UE functional use, improper body mechanics, postural dysfunction, and pain.   ACTIVITY LIMITATIONS: carrying, lifting, bending, standing, squatting, stairs, transfers, bed mobility, reach over head, and locomotion level  PARTICIPATION LIMITATIONS: meal prep, cleaning, driving, shopping, and community activity  PERSONAL FACTORS: Age, Fitness, Past/current experiences, Time since onset of injury/illness/exacerbation, Transportation, and 1-2 comorbidities: extensive cardiac hx, prior CVA w/ L hemiparesis and chronic pain are also affecting patient's functional outcome.   REHAB POTENTIAL: Good  CLINICAL DECISION MAKING: Evolving/moderate complexity  EVALUATION COMPLEXITY: Moderate  PLAN:  PT FREQUENCY: 1x/week  PT DURATION: 8 weeks  PLANNED INTERVENTIONS: 97164- PT Re-evaluation, 97750- Physical Performance Testing, 97110-Therapeutic exercises, 97530- Therapeutic activity, [id number]- Neuromuscular re-education, 97535- Self Care, [id number]- Manual therapy, [phone]- Gait training, [phone]- Aquatic Therapy, [phone]- Electrical stimulation (manual), Patient/Family education, Balance training, Stair training, Taping, Joint mobilization, Vestibular training, DME instructions, Cryotherapy, and Moist heat  PLAN FOR NEXT SESSION: Progress strengthening HEP for BLE, work on gentle core w/o straining - supine bracing w/o holding breath, left hip flexor (hanging hip flexor stretch? vs standing); windmills, trunk rotation, deadbug, modified birddog, wood chops, tall vs half kneel?  Coordination - blaze pods.  Review passive stretching from 1/13 and progress to independence for HEP!  NO weight > 5lbs.   Daved KATHEE Bull, PT, DPT 12/04/2024, 11:42 AM        "

## 2024-12-04 NOTE — Patient Instructions (Signed)
*  LARGE FONT per pt request! Access Code: [email or web address] URL: [email or web address] Date: 11/27/2024 Prepared by: Daved Bull  Exercises - Semi-Tandem Balance at [employer] Open  - 1 x daily - 4 x weekly - 1 sets - 2 reps - 45 seconds hold - Feet Together Balance at [employer] Eyes Closed  - 1 x daily - 4 x weekly - 1 sets - 2 reps - 20 seconds hold - Narrow Stance with Counter Support  - 1 x daily - 4 x weekly - 1 sets - 2 reps - 30 seconds hold - Standing March with Counter Support  - 1 x daily - 4 x weekly - 2 sets - 10 reps - Sit to Stand with Armchair  - 1 x daily - 7 x weekly - 1-2 sets - 10 reps - Supine Bridge  - 1 x daily - 4 x weekly - 2 sets - 10 reps - Supine Posterior Pelvic Tilt  - 1 x daily - 4 x weekly - 2 sets - 10 reps - Supine March  - 1 x daily - 4 x weekly - 2 sets - 10 reps

## 2024-12-05 ENCOUNTER — Encounter: Payer: Self-pay | Admitting: Internal Medicine

## 2024-12-05 ENCOUNTER — Ambulatory Visit (INDEPENDENT_AMBULATORY_CARE_PROVIDER_SITE_OTHER): Admitting: Internal Medicine

## 2024-12-05 VITALS — BP 138/78 | HR 71 | Resp 18 | Ht 70.0 in | Wt 221.0 lb

## 2024-12-05 DIAGNOSIS — E1122 Type 2 diabetes mellitus with diabetic chronic kidney disease: Secondary | ICD-10-CM | POA: Diagnosis not present

## 2024-12-05 DIAGNOSIS — N189 Chronic kidney disease, unspecified: Secondary | ICD-10-CM

## 2024-12-05 DIAGNOSIS — E1165 Type 2 diabetes mellitus with hyperglycemia: Secondary | ICD-10-CM

## 2024-12-05 DIAGNOSIS — E11319 Type 2 diabetes mellitus with unspecified diabetic retinopathy without macular edema: Secondary | ICD-10-CM | POA: Diagnosis not present

## 2024-12-05 DIAGNOSIS — E1142 Type 2 diabetes mellitus with diabetic polyneuropathy: Secondary | ICD-10-CM

## 2024-12-05 DIAGNOSIS — E1159 Type 2 diabetes mellitus with other circulatory complications: Secondary | ICD-10-CM

## 2024-12-05 DIAGNOSIS — Z794 Long term (current) use of insulin: Secondary | ICD-10-CM

## 2024-12-05 DIAGNOSIS — E7849 Other hyperlipidemia: Secondary | ICD-10-CM

## 2024-12-05 NOTE — Patient Instructions (Addendum)
 Please continue: - Lantus  7 units at bedtime  Change: - Humalog  15 min before a meal: 8 units before brunch 6-8 units before dinner If you inject after the meal, only inject 3-4 units.  STOP SWEET TEA or ANY SWEET DRINKS!   Please return in 3-4 months.

## 2024-12-05 NOTE — Progress Notes (Signed)
 Patient ID: [name], male   DOB: 09/05/1951, 74 y.o.   MRN: [id number]  HPI: [name] is a 74 y.o.-year-old male, returning for follow-up for DM2, dx in 2007, insulin -dependent since 2011, uncontrolled, with complications (CAD, stroke, CKD, DR, ED, PN). Pt. previously saw Dr. Kassie, but last visit with me 4 months ago.  Interim history: No increased urination, no nausea, no chest pain.  His blurry vision improved after cataract surgery last year, but he again developed blurry vision recently. He was not able to stop sweet tea since last visit. Since last visit, he was admitted for a fall and had an abdominal MRI (11/02/2024) that showed a lesion on the uncinate process of the pancreas.  A pancreatic MRI was recommended.  Of note, his glucose at that time was 384 in house.  At that time, he was also found to have a left inguinal hernia for which he had surgery 11/05/2024.  Reviewed HbA1c: Lab Results  Component Value Date   HGBA1C 7.6 (H) 11/03/2024   HGBA1C 8.3 (A) 07/31/2024   HGBA1C 8.0 (A) 03/28/2024   HGBA1C 7.5 (A) 12/09/2023   HGBA1C 8.3 (H) 07/14/2023   HGBA1C 8.3 (A) 04/05/2023   HGBA1C 8.4 (A) 12/21/2022   HGBA1C 8.0 (A) 08/17/2022   HGBA1C 7.4 (A) 04/16/2022   HGBA1C 6.9 (A) 01/04/2022  10/11/2023 HbA1c 7.9% ([facility]) 04/22/2022: HbA1c 7.5%  Previously on: - Humalog  50/50 (prev. From PAP, now covered by the new insurance w/o a PA) 22 units after brunch >> moved to 15 minutes before brunch -  -stopped  in 12/2021 because feeling weird, sweaty He has significant tremors and cannot draw insulin  from vials.  Then on: - Humalog  50/50 24 >> 18-20 >> 22 units 15 min before brunch He tried  Farxiga  10 mg before b'fast >> too $$$, did not qualify for PAP  Currently on: - Lantus  14 >> 12 >> 10 >> 7 units at bedtime - Humalog  6-10 units 15 min before brunch and dinner - If you inject after the meal, only inject 4-6 units >> 8-12 units before brunch 10 units  before dinner If you inject after the meal, only inject6 units.  Pt checks his sugars >4x a day and they are ([facility]):  Previously:  Previously:  Lowest sugar was 53 >> 54 >> 50s >> 54; he has hypoglycemia awareness at 70.  Highest sugar was 300s >> upper 200s >> 350 >> 350.  Glucometer: Accu-Chek  She usually has 2 meals: Brunch (11-am -2 pm) and dinner (6-8 pm).  - + CKD-sees nephrology, last BUN/creatinine:  Lab Results  Component Value Date   BUN 22 11/05/2024   BUN 26 (H) 11/04/2024   CREATININE 2.02 (H) 11/05/2024   CREATININE 1.89 (H) 11/04/2024   Lab Results  Component Value Date   MICRALBCREAT 523 06/05/2024   MICRALBCREAT 61 02/15/2024   MICRALBCREAT 108.4 (H) 01/18/2024   MICRALBCREAT 85 02/26/2023  On Cozaar  100 mg daily.  - + HL; last set of lipids: Lab Results  Component Value Date   CHOL 113 01/18/2024   HDL 55.10 01/18/2024   LDLCALC 42 01/18/2024   LDLDIRECT 63.0 01/12/2016   TRIG 79.0 01/18/2024   CHOLHDL 2 01/18/2024  10/11/2023 ([facility]):112/106/51.3/34 On Lipitor 40 mg daily.  - last eye exam was 06/29/2024: + DR. + Cataract Sx in 07/ and 06/2024.  - no numbness and tingling in his feet.  Last foot exam 08/15/2024 by Dr. Loreda.  TSH levels: 01/18/2024: TSH 2.2 10/11/2023:  TSH 1.872 01/25/2023: TSH 2.61 04/22/2022: TSH 2.46  He was admitted with nausea/vomiting, CP - in 06/2023.  At that time, he was found to have cholelithiasis.  He had a slightly high lipase of 57 (11-51).    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   Chronic kidney disease    Constipation    takes Carafate  four times day   DIAB W/UNSPEC COMP TYPE II/UNSPEC TYPE UNCNTRL    ERECTILE DYSFUNCTION    GERD    HYPERLIPIDEMIA-MIXED    HYPERTENSION, BENIGN    LBBB  (left bundle branch block) 02/02/2016   MORTON'S NEUROMA, RIGHT    OSA on CPAP 09/09/2020   Peripheral neuropathy    Seasonal allergies    takes Allegra and Benadryl  daily prn;uses Flonase  daily   SHOULDER PAIN, RIGHT    Snoring 09/09/2020   Stroke, 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, s/p CIR    TIA on medication 02/2012   Unstable angina (HCC) 07/09/2020   Past Surgical History:  Procedure Laterality Date   ANGIOPLASTY     COLONOSCOPY     CORONARY ANGIOPLASTY  2005   2 stents   CORONARY STENT INTERVENTION N/A 07/10/2020   Procedure: CORONARY STENT INTERVENTION;  Surgeon: Jordan, Peter M, MD;  Location: [facility] INVASIVE CV LAB;  Service: Cardiovascular;  Laterality: N/A;   DENTAL SURGERY     INGUINAL HERNIA REPAIR Left 11/05/2024   Procedure: REPAIR, HERNIA, INGUINAL, ADULT;  Surgeon: Ebbie Cough, MD;  Location: [facility] OR;  Service: General;  Laterality: Left;   LARYNGOPLASTY  08/07/2012   Procedure: LARYNGOPLASTY;  Surgeon: Ida Loader, MD;  Location: [facility] OR;  Service: ENT;  Laterality: Left;  Left Vocal Cord Medialyzation   LEFT HEART CATH AND CORONARY ANGIOGRAPHY N/A 07/10/2020   Procedure: LEFT HEART CATH AND CORONARY ANGIOGRAPHY;  Surgeon: Jordan, Peter M, MD;  Location[facility] INV[facility] CV LAB;  Service: Cardiovascular;  Laterality: N/A;   left knee surgury     x 2    stent  2001, 2004   coronary stents   Social History   Socioeconomic History   Marital status: Legally Separated    Spouse name: Not on file   Number of children: 3   Years of education: college   Highest education level: Not on file  Occupational History   Occupation: disabled    Employer: [employer]  Tobacco Use   Smoking status: Former    Current packs/day: 0.00    Average packs/day: 2.5 packs/day for 9.0 years (22.5 ttl pk-yrs)    Types: Cigarettes    Start date: 02/23/1974    Quit date: 02/24/1983    Years since quitting: 41.8   Smokeless tobacco: Never  Vaping Use   Vaping status: Never Used   Substance and Sexual Activity   Alcohol use: No    Alcohol/week: 0.0 standard drinks of alcohol   Drug use: No   Sexual activity: Not Currently  Other Topics Concern   Not on file  Social History Narrative      Lives with Daughter   Social Drivers of Health   Tobacco Use: Medium Risk (12/04/2024)   Received from [facility]   Patient History    Smoking  Tobacco Use: Former    Smokeless Tobacco Use: Never    Passive Exposure: Not on Actuary Strain: Low Risk (09/28/2023)   Overall Financial Resource Strain (CARDIA)    Difficulty of Paying Living Expenses: Not hard at all  Food Insecurity: No Food Insecurity (11/02/2024)   Epic    Worried About Programme Researcher, Broadcasting/film/video in the Last Year: Never true    Ran Out of Food in the Last Year: Never true  Transportation Needs: No Transportation Needs (11/02/2024)   Epic    Lack of Transportation (Medical): No    Lack of Transportation (Non-Medical): No  Physical Activity: Insufficiently Active (10/01/2024)   Exercise Vital Sign    Days of Exercise per Week: 5 days    Minutes of Exercise per Session: 10 min  Stress: No Stress Concern Present (10/01/2024)   [employer] of Occupational Health - Occupational Stress Questionnaire    Feeling of Stress: Not at all  Social Connections: Socially Isolated (11/02/2024)   Social Connection and Isolation Panel    Frequency of Communication with Friends and Family: Three times a week    Frequency of Social Gatherings with Friends and Family: Three times a week    Attends Religious Services: Never    Active Member of Clubs or Organizations: No    Attends Banker Meetings: Never    Marital Status: Separated  Intimate Partner Violence: Not At Risk (11/02/2024)   Epic    Fear of Current or Ex-Partner: No    Emotionally Abused: No    Physically Abused: No    Sexually Abused: No  Depression (PHQ2-9): Low Risk (11/28/2024)   Depression (PHQ2-9)     PHQ-2 Score: 0  Alcohol Screen: Low Risk (03/05/2022)   Alcohol Screen    Last Alcohol Screening Score (AUDIT): 0  Housing: Unknown (12/04/2024)   Received from [facility]   Epic    Unable to Pay for Housing in the Last Year: Not on file    Number of Times Moved in the Last Year: Not on file    At any time in the past 12 months, were you homeless or living in a shelter (including now)?: No  Utilities: Not At Risk (11/02/2024)   Epic    Threatened with loss of utilities: No  Health Literacy: Adequate Health Literacy (10/01/2024)   B1300 Health Literacy    Frequency of need for help with medical instructions: Never   Current Outpatient Medications on File Prior to Visit  Medication Sig Dispense Refill   acetaminophen  (TYLENOL ) 500 MG tablet Take 1 tablet (500 mg total) by mouth every 6 (six) hours as needed for mild pain (pain score 1-3).     aspirin  EC 81 MG tablet Take 1 tablet (81 mg total) by mouth daily. Swallow whole. 30 tablet 11   atorvastatin  (LIPITOR) 40 MG tablet TAKE 1 TABLET EVERY DAY 90 tablet 3   Blood Pressure Monitoring (BLOOD PRESSURE CUFF) MISC 1 Units by Does not apply route daily. 1 each 0   calcium  carbonate (TUMS - DOSED IN MG ELEMENTAL CALCIUM ) 500 MG chewable tablet Chew 1 tablet by mouth daily as needed for indigestion or heartburn.     carvedilol  (COREG ) 25 MG tablet TAKE 1 TABLET TWICE DAILY 180 tablet 1   clopidogrel  (PLAVIX ) 75 MG tablet TAKE 1 TABLET EVERY DAY 90 tablet 1   dextrose  (GLUCO TO GO 15) 40 % GEL Take 1 Tube by mouth once as needed for  low blood sugar.     doxycycline  (VIBRA -TABS) 100 MG tablet Take 1 tablet (100 mg total) by mouth 2 (two) times daily for 10 days. 20 tablet 0   fluticasone  (FLONASE ) 50 MCG/ACT nasal spray Place 2 sprays into both nostrils daily. (Patient taking differently: Place 2 sprays into both nostrils daily as needed.) 16 g 0   glucose blood (ACCU-CHEK AVIVA PLUS) test strip uad tid 100 each 12   insulin   glargine (LANTUS  SOLOSTAR) 100 UNIT/ML Solostar Pen Inject 7-8 Units into the skin daily. (Patient taking differently: Inject 7 Units into the skin at bedtime.)     insulin  lispro (HUMALOG  KWIKPEN) 100 UNIT/ML KwikPen Inject 6-10 Units into the skin 2 (two) times daily before a meal. 30 mL 3   Insulin  Pen Needle 32G X 4 MM MISC Use 4x a day 300 each 3   losartan  (COZAAR ) 100 MG tablet TAKE 1 TABLET EVERY DAY 90 tablet 3   Menthol , Topical Analgesic, 4 % GEL Apply 1 application  topically daily as needed.     Microlet Lancets MISC UAD to check sugars TID.  E11.22 270 each 3   nitroGLYCERIN  (NITROSTAT ) 0.4 MG SL tablet Place 1 tablet (0.4 mg total) under the tongue every 5 (five) minutes as needed for chest pain (up to 3 doses). 25 tablet 3   pantoprazole  (PROTONIX ) 40 MG tablet Take 1 tablet (40 mg total) by mouth daily at 6 (six) AM. 30 tablet 0   senna-docusate (SENOKOT-S) 8.6-50 MG per tablet Take 2 tablets by mouth 2 (two) times daily. For constipation.     Spacer/Aero-Holding Chambers DEVI 1 each by Does not apply route as needed. 1 each 0   No current facility-administered medications on file prior to visit.   Allergies  Allergen Reactions   Penicillins Anaphylaxis   Pneumococcal Vaccines Anaphylaxis   Shellfish Allergy Anaphylaxis   Barbiturates    Gabapentin      Urinary retention   Influenza Vaccines    Lisinopril  Cough   Other Nausea And Vomiting    Pt wife states that dye that is used for eye exam in dr    Sildenafil Other (See Comments) and Nausea And Vomiting    Other reaction(s): Headache, Dizziness   Topiramate Other (See Comments)    Chest spasms and numbness   Zofran  [Ondansetron ]    Amlodipine  Other (See Comments)    LE edema   Metformin  Nausea And Vomiting   Tadalafil Other (See Comments)    dizziness   Vardenafil Other (See Comments)    headaches   Family History  Problem Relation Age of Onset   Cancer Mother    Liver cancer Brother    Colon cancer Neg Hx     Esophageal cancer Neg Hx    PE: BP 138/78   Pulse 71   Resp 18   Ht 5' 10 (1.778 m)   Wt 221 lb (100.2 kg)   SpO2 99%   BMI 31.71 kg/m  Wt Readings from Last 15 Encounters:  12/05/24 221 lb (100.2 kg)  11/28/24 216 lb (98 kg)  11/06/24 210 lb 8.6 oz (95.5 kg)  10/01/24 216 lb 9.6 oz (98.2 kg)  07/31/24 214 lb 3.2 oz (97.2 kg)  03/28/24 213 lb (96.6 kg)  03/09/24 213 lb 6.5 oz (96.8 kg)  02/08/24 213 lb 4.8 oz (96.8 kg)  01/18/24 216 lb 6.4 oz (98.2 kg)  12/16/23 215 lb 3.2 oz (97.6 kg)  12/09/23 221 lb (100.2 kg)  09/28/23 210 lb (  95.3 kg)  08/30/23 207 lb (93.9 kg)  08/09/23 217 lb (98.4 kg)  07/19/23 214 lb (97.1 kg)   Constitutional: overweight, in NAD, walks with a walker Eyes: EOMI, no exophthalmos ENT: no thyromegaly, no cervical lymphadenopathy Cardiovascular: RRR, No MRG Respiratory: CTA B Musculoskeletal: no deformities Skin: + few Macular, scaly, rash on legs Neurological: + tremor with outstretched L hand, + L sided paresis  ASSESSMENT: 1. DM2, insulin -dependent, uncontrolled, with complications - CAD - s/p BMS in 2001, and DES in 2005 - stroke - CKD - DR - ED - PN - Hypoglycemia-last in 2020  2. HL  PLAN:  1. Patient with longstanding, uncontrolled, type 2 diabetes, on basal-bolus insulin  regimen, changed from a premixed insulin  regimen previously.  Of note, we are not able to use an SGLT2 inhibitor due to price and he also did not qualify for patient assistance.  More recently, nephrology did recommend an SGLT2 inhibitor, but he declines. - At last visit, sugars were significantly improved in the evening and overnight after he started to take Humalog  15 minutes before his dinners.  He was able to reduce the Lantus  dose from 10 units to 7 units at night.  Sugars were still increasing after brunch/lunch possibly due to the fact that he was not able to stop sweet tea since the previous visit.  I strongly advised him to try his best to stop this.  He  was also forgetting his midday insulin  dose and we discussed about the need to take the pen with him and inject before every meal.  I advised him that he may need a higher dose of Humalog  before lunch or dinner with more carbs.  HbA1c at last visit was higher, at 8.3%, but this was higher than expected from his blood sugars at home.  Indeed, since last visit, he had another HbA1c obtained last month and this was lower, at 7.6%. CGM interpretation: -At today's visit, we reviewed his CGM downloads: It appears that 90% of values are in target range (goal >70%), while 5% are higher than 180 (goal <25%), and 5% are lower than 70 (goal <4%).  The calculated average blood sugar is 113.  The projected HbA1c for the next 3 months (GMI) is 6.0%.   -Reviewing the CGM trends, sugars appear to be improved from before, now fluctuating mostly within the target range, with slightly higher blood sugars after his first meal of the day but still with most of the blood sugars under 180.  He also had low particularly after brunch and dinner.  At today's visit, we discussed about reducing the doses of his Humalog  for both of his meals and also advised him to take less insulin  if he has to take Humalog  after the meal.  He is currently taking proximately 6 units if he forgets to take the injection 15 minutes before the meals and he has to take it after the meal and will reduce this to 3-4 units.  For now, we will continue the same dose of Lantus .  I did advise him to let me know if he has any more lows. - I suggested to:  Patient Instructions  Please continue: - Lantus  7 units at bedtime  Change: - Humalog  15 min before a meal: 8 units before brunch 6-8 units before dinner If you inject after the meal, only inject 3-4 units.  STOP SWEET TEA or ANY SWEET DRINKS!   Please return in 3-4 months.  - HbA1c was checked today by mistake  well at 7.6%, stable. - advised to check sugars at different times of the day - 4x a day,  rotating check times - advised for yearly eye exams >> he is UTD - return to clinic in 3-4 months  2. HL - Latest lipid panel was reviewed from 12/2023: All fractions at goal: Lab Results  Component Value Date   CHOL 113 01/18/2024   HDL 55.10 01/18/2024   LDLCALC 42 01/18/2024   LDLDIRECT 63.0 01/12/2016   TRIG 79.0 01/18/2024   CHOLHDL 2 01/18/2024  - He continues on Lipitor 40 mg daily without side effects  Lela Fendt, MD PhD [facility] Endocrinology

## 2024-12-10 ENCOUNTER — Other Ambulatory Visit (HOSPITAL_BASED_OUTPATIENT_CLINIC_OR_DEPARTMENT_OTHER): Payer: Self-pay | Admitting: Cardiovascular Disease

## 2024-12-10 ENCOUNTER — Other Ambulatory Visit: Payer: Self-pay

## 2024-12-11 ENCOUNTER — Other Ambulatory Visit: Payer: Self-pay | Admitting: Internal Medicine

## 2024-12-11 ENCOUNTER — Encounter: Payer: Self-pay | Admitting: Physical Therapy

## 2024-12-11 ENCOUNTER — Ambulatory Visit: Admitting: Physical Therapy

## 2024-12-11 DIAGNOSIS — R293 Abnormal posture: Secondary | ICD-10-CM

## 2024-12-11 DIAGNOSIS — R262 Difficulty in walking, not elsewhere classified: Secondary | ICD-10-CM

## 2024-12-11 DIAGNOSIS — M6281 Muscle weakness (generalized): Secondary | ICD-10-CM

## 2024-12-11 DIAGNOSIS — R2689 Other abnormalities of gait and mobility: Secondary | ICD-10-CM

## 2024-12-11 DIAGNOSIS — R278 Other lack of coordination: Secondary | ICD-10-CM

## 2024-12-11 DIAGNOSIS — R2681 Unsteadiness on feet: Secondary | ICD-10-CM

## 2024-12-11 NOTE — Therapy (Signed)
 " OUTPATIENT PHYSICAL THERAPY NEURO TREATMENT   Patient Name: [name] MRN: [id number] DOB:1951/07/16, 74 y.o., male Today's Date: 12/11/2024   PCP: Mike Glade PARAS, MD REFERRING PROVIDER: Cherlyn Labella, MD  END OF SESSION:  PT End of Session - 12/11/24 1104     Visit Number 4    Number of Visits 9   8 + 1   Date for Recertification  01/18/25   pushed out due to holidays   Authorization Type [employer]    Progress Note Due on Visit 10    PT Start Time 1100    PT Stop Time 1143    PT Time Calculation (min) 43 min    Equipment Utilized During Treatment Gait belt    Activity Tolerance Patient tolerated treatment well    Behavior During Therapy WFL for tasks assessed/performed          Past Medical History:  Diagnosis Date   ALLERGIC RHINITIS    CAD, NATIVE VESSEL    BMS to OM1 2001, DES to BMS 2005   Chronic back pain    herniated disc   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   Chronic kidney disease    Constipation    takes Carafate  four times day   DIAB W/UNSPEC COMP TYPE II/UNSPEC TYPE UNCNTRL    ERECTILE DYSFUNCTION    GERD    HYPERLIPIDEMIA-MIXED    HYPERTENSION, BENIGN    LBBB (left bundle branch block) 02/02/2016   MORTON'S NEUROMA, RIGHT    OSA on CPAP 09/09/2020   Peripheral neuropathy    Seasonal allergies    takes Allegra and Benadryl  daily prn;uses Flonase  daily   SHOULDER PAIN, RIGHT    Snoring 09/09/2020   Stroke, 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, s/p CIR    TIA on medication 02/2012   Unstable angina (HCC) 07/09/2020   Past Surgical History:  Procedure Laterality Date   ANGIOPLASTY     COLONOSCOPY     CORONARY ANGIOPLASTY  2005   2 stents   CORONARY STENT INTERVENTION N/A 07/10/2020   Procedure: CORONARY STENT INTERVENTION;  Surgeon: Jordan, Peter M, MD;  Location: [facility] INVASIVE [facility];  Service: Cardiovascular;  Laterality: N/A;   DENTAL SURGERY     INGUINAL HERNIA REPAIR Left  11/05/2024   Procedure: REPAIR, HERNIA, INGUINAL, ADULT;  Surgeon: Mike Cough, MD;  Location: [facility] OR;  Service: General;  Laterality: Left;   LARYNGOPLASTY  08/07/2012   Procedure: LARYNGOPLASTY;  Surgeon: Mike Loader, MD;  Location: [facility] OR;  Service: ENT;  Laterality: Left;  Left Vocal Cord Medialyzation   LEFT HEART CATH AND CORONARY ANGIOGRAPHY N/A 07/10/2020   Procedure: LEFT HEART CATH AND CORONARY ANGIOGRAPHY;  Surgeon: Jordan, Peter M, MD;  Location: [facility] INVASIVE CV LAB;  Service: Cardiovascular;  Laterality: N/A;   left knee surgury     x 2    stent  2001, 2004   coronary stents   Patient Active Problem List   Diagnosis Date Noted   H/O left inguinal hernia repair 11/28/2024   Upper respiratory tract infection 11/28/2024   Lesion of pancreas 11/25/2024   Left thigh pain 11/02/2024   Right-sided face pain 01/18/2024   Conjunctivitis 12/16/2023   Near syncope 12/15/2023   Dehydration 12/15/2023   Plantar fasciitis of left foot 11/09/2023   Pancreatic mass -MRI for follow-up due 06/2024 07/19/2023   Calculus of gallbladder without cholecystitis without obstruction 07/19/2023   Constipation 07/19/2023   Intractable nausea  and vomiting 07/14/2023   Epigastric pain 07/14/2023   Transaminitis 07/14/2023   Back pain 03/04/2023   Diarrhea 06/09/2022   Ganglion cyst of wrist, left 12/14/2021   Urinary frequency 11/06/2021   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   Vocal cord paralysis 03/25/2021   Diabetic peripheral neuropathy (HCC) 12/25/2020   Orthopnea 12/16/2020   OSA on CPAP 09/09/2020   Sciatica of left side 07/11/2019   Walker 07/11/2019   Edema leg 10/26/2018   History of stroke 10/26/2018   DOE (dyspnea on exertion) 10/26/2018   Left hip pain 07/14/2018   Acute left ankle pain 07/14/2018   Chest pain 07/14/2018   Nonintractable headache 02/24/2018   CKD stage 3b, GFR 30-44 ml/min (HCC) 02/22/2017   Anemia of chronic disease 02/16/2017   Chronic diastolic heart failure  (HCC) 91/91/7982   LBBB (left bundle branch block) 02/02/2016   Nonallopathic lesion of lumbosacral region 12/11/2014   Nonallopathic lesion of sacral region 12/11/2014   Nonallopathic lesion of thoracic region 12/11/2014   Arthritis of left hip 11/20/2014   Ischial bursitis of left side 10/28/2014   Hamstring tightness of left lower extremity 09/16/2014   Piriformis syndrome of left side 09/16/2014   Dysphonia 06/22/2013   Hemiparesis affecting left side as late effect of stroke (HCC) 10/10/2012   Dysphagia following cerebrovascular accident 08/14/2012   Chronic back pain    Peripheral neuropathy (HCC)    TIA (transient ischemic attack) 04/08/2012   Stroke (HCC) 02/21/2012   MORTON'S NEUROMA, RIGHT 05/29/2010   Type 2 diabetes mellitus with hyperlipidemia (HCC) 04/13/2010   Allergic rhinitis 04/13/2010   GERD (gastroesophageal reflux disease) 04/13/2010   ERECTILE DYSFUNCTION 03/12/2009   Hypertension associated with type 2 diabetes mellitus (HCC) 03/12/2009   CAD S/P percutaneous coronary angioplasty 03/12/2009   Hyperlipidemia associated with type 2 diabetes mellitus (HCC) 03/07/2009    ONSET DATE: 11/02/2024 (hospital admission)  REFERRING DIAG: Z86.73 (ICD-10-CM) - History of stroke M25.552 (ICD-10-CM) - Left hip pain  THERAPY DIAG:  Abnormal posture  Difficulty in walking, not elsewhere classified  Muscle weakness (generalized)  Other lack of coordination  Unsteadiness on feet  Other abnormalities of gait and mobility  Rationale for Evaluation and Treatment: Rehabilitation  SUBJECTIVE:                                                                                                                                                                                             SUBJECTIVE STATEMENT: [name]   He is ambulating w/ 2WW today requesting height be adjusted as he is hunching over.  He did not do HEP recently due to cold.  Pt  accompanied by: self (pt drove  himself)   PERTINENT HISTORY: DM2, HTN, R Morton's neuroma, CAD s/p stent, GERD, TIA, CVA (right thalamic/posterior limb internal capsule) w/ L hemi Sept 2013, chronic LBP, dysphagia, LBBB, chronic diastolic CHF, CKD stage 3b  Admitted to [facility] hospital 12/12-12/16 due to left thigh and groin pain s/p fall.  Inguinal hernia repair w/ mesh placement 11/05/24.  PAIN:  Are you having pain? Yes: NPRS scale: 7 Pain location: just above the left iliac crest and down to groin and medial left thigh Pain description: sharp needle mostly, intermittently dull Aggravating factors: constant Relieving factors: constant  PRECAUTIONS: Fall and Other: inguinal hernia repair - no straining, pushing/pulling or lifting over 5 lbs  RED FLAGS: None   WEIGHT BEARING RESTRICTIONS: No  FALLS: Has patient fallen in last 6 months? Yes. Number of falls 1- prior to hospital visit  LIVING ENVIRONMENT: Lives with: lives with their family (dtr and 4 grandchildren) Lives in: House/apartment Stairs: Yes: Internal: 15 steps; on left going up and External: 1 STE steps; none Has following equipment at home: Single point cane, Walker - 2 wheeled, Wheelchair (manual), shower chair, Shower bench, and Grab bars  PLOF: Independent with basic ADLs, Independent with household mobility with device, Independent with community mobility with device, Independent with transfers, Requires assistive device for independence, and Needs assistance with homemaking  PATIENT GOALS: to start walking a lot better than I am now to stop having a balance problem  OBJECTIVE:  Note: Objective measures were completed at Evaluation unless otherwise noted.  DIAGNOSTIC FINDINGS:  MRI Brain 11/02/2024 IMPRESSION: 1. No acute intracranial abnormality. 2. Chronic right thalamic infarct. 3. Moderate chronic small vessel ischemic disease. 4. Small chronic cerebellar infarcts.  COGNITION: Overall cognitive status: Within functional limits for  tasks assessed   SENSATION: Light touch: L foot and ankle diminished/incorrect ID of location w/ repeated attempts  COORDINATION: LE RAMS:  slow and deliberate Heel-to-shin:  limited by diminished AROM and pain  EDEMA:  Pt reports more L foot and ankle swelling past couple days, but no pitting edema or visible swelling noted compared to RLE; generalized tenderness to palpation over entire LLE, no redness or warmth of LLE noted  MUSCLE TONE: LLE: Mild  POSTURE: forward head, increased thoracic kyphosis, and posterior pelvic tilt  LOWER EXTREMITY AROM/MMT:    MMT Right Eval Left Eval  Hip flexion Grossly WFL; can stand and ambulate against gravity, slow left flexion pattern and limited hip flexion in sitting.  Hip extension   Hip abduction   Hip adduction   Hip internal rotation   Hip external rotation   Knee flexion   Knee extension   Ankle dorsiflexion   Ankle plantarflexion   Ankle inversion    Ankle eversion    (Blank rows = not tested)  BED MOBILITY:  Findings: Sit to supine Complete Independence Supine to sit Complete Independence Rolling to Right Complete Independence Rolling to Left Complete Independence  TRANSFERS: Sit to stand: SBA  Assistive device utilized: Environmental Consultant - 2 wheeled     Stand to sit: SBA  Assistive device utilized: Environmental Consultant - 2 wheeled     Chair to chair: SBA  Assistive device utilized: Environmental Consultant - 2 wheeled       RAMP:  Not tested  CURB:  Not tested  STAIRS: Not tested GAIT: Findings: Gait Characteristics: step to pattern, step through pattern, antalgic, trunk flexed, and narrow BOS, Distance walked: various clinic distances, Assistive device utilized:Walker - 2 wheeled, Level of  assistance: SBA, and Comments: No LOB or stray from pathway, mildly decreased stance time of LLE intermittently w/ pain  FUNCTIONAL TESTS:  Timed up and go (TUG): 45.91 sec 10 meter walk test: 29.66 sec = 0.34 Walker/sec OR 1.11 ft/sec Berg Balance Scale:  TBA     PATIENT SURVEYS:  ABC scale: The Activities-Specific Balance Confidence (ABC) Scale 0% 10 20 30  40 50 60 70 80 90 100% No confidence<->completely confident  How confident are you that you will not lose your balance or become unsteady when you . . .   Date tested 11/13/2024  Walk around the house 50%  2. Walk up or down stairs 0%  3. Bend over and pick up a slipper from in front of a closet floor 0%  4. Reach for a small can off a shelf at eye level 60%  5. Stand on tip toes and reach for something above your head 0%  6. Stand on a chair and reach for something 0%  7. Sweep the floor 50%  8. Walk outside the house to a car parked in the driveway 90%  9. Get into or out of a car 90%  10. Walk across a parking lot to the mall 30%  11. Walk up or down a ramp 90%  12. Walk in a crowded mall where people rapidly walk past you 70%  13. Are bumped into by people as you walk through the mall 90%  14. Step onto or off of an escalator while you are holding onto the railing 0%  15. Step onto or off an escalator while holding onto parcels such that you cannot hold onto the railing 0%  16. Walk outside on icy sidewalks 0%  Total: #/16 620/16 = 38.75 %                                                                                                                                 TREATMENT DATE: 12/11/2024 -Adjusted height of 2WW and explained goal posture and mechanics when standing w/ 2WW -Seated hamstring stretch 2x45 sec each LE -Supine SKTC 2x30 sec each LE -Supine butterfly stretch 2x60 sec -Standing hip flexor stretch 2x40 sec each LE -Seated trunk rotation stretch 2x45 sec each side -Seated windmill stretch x10 each side, cued to slow pace and breathe  -Pt ambulates x88 ft w/ SPC SBA-CGA using proper sequencing.  Decreased L arm swing and step to pattern w/ some intermittent R foot slap with distance.  No LOB or toe catch.  PATIENT EDUCATION: Education details: Please continue  desensitization techniques and walking program (please progress as able - current 5 mins 3x per day).   Continue HEP.  STS sequencing.  Cane use over level ground - can practice in home, but continue 2WW for long and unlevel distances to decrease fall risk.  Bring personal SPC into next session to assess height and use further. Person educated: Patient Education method: Explanation,  Demonstration, Verbal cues, and Handouts Education comprehension: verbalized understanding and needs further education  HOME EXERCISE PROGRAM: Desensitization Techniques: -Light touch/pressure:  using fingertip pressure to slowly move up small segments of the affected body part until outside the area of pain and then back to where you started -Deep pressure:  using fingertips to squeeze in small segments starting outside the affected area, crossing over the affected area, and then to the other side of the affected area -Tapping:  fingertips tap with firm pressure over the affected area, can move around the affected area as well -Brushing/scratching:  use fingertips to brush/scratch over and around the affected area -Textures:  use soft and rough textured items like cotton balls vs wash cloths to brush or rub with light into firm pressure over and around the affected area  Repeat these 3-4x per day.  You Can Walk For A Certain Length Of Time Each Day (use most appropriate assistive device - as of 12/23 use the walker)                          Walk 2-3 minutes 3-4 times per day.             Increase 1-2  minutes every week             Work up to 20 minutes (at least 1x a day).               Example:                         Day 1-2           4-5 minutes     3 times per day                         Day 7-8           10-12 minutes 2-3 times per day                         Day 13-14       20-22 minutes 1-2 times per day  *LARGE FONT per pt request! Access Code: [email or web address] URL: [email or web address] Date:  11/27/2024 Prepared by: Daved Bull  Exercises - Semi-Tandem Balance at [employer] Open  - 1 x daily - 4 x weekly - 1 sets - 2 reps - 45 seconds hold - Feet Together Balance at [employer] Eyes Closed  - 1 x daily - 4 x weekly - 1 sets - 2 reps - 20 seconds hold - Narrow Stance with Counter Support  - 1 x daily - 4 x weekly - 1 sets - 2 reps - 30 seconds hold - Standing March with Counter Support  - 1 x daily - 4 x weekly - 2 sets - 10 reps - Sit to Stand with Armchair  - 1 x daily - 7 x weekly - 1-2 sets - 10 reps - Supine Bridge  - 1 x daily - 4 x weekly - 2 sets - 10 reps - Supine Posterior Pelvic Tilt  - 1 x daily - 4 x weekly - 2 sets - 10 reps - Supine March  - 1 x daily - 4 x weekly - 2 sets - 10 reps - Seated Hamstring Stretch  - 1 x daily - 4 x weekly - 1 sets - 2 reps -  45 seconds hold - Hooklying Single Knee to Chest Stretch with Towel  - 1 x daily - 4 x weekly - 1 sets - 2 reps - 30 sec hold - Supine Butterfly Groin Stretch  - 1 x daily - 4 x weekly - 1 sets - 2 reps - 30-60 sec hold - Standing Hip Flexor Stretch  - 1 x daily - 4 x weekly - 1 sets - 2 reps - 30-45 sec hold - Seated Windmill Trunk Rotation Stretch  - 1 x daily - 4 x weekly - 1 sets - 10 reps  GOALS: Goals reviewed with patient? Yes  SHORT TERM GOALS: Target date: 12/15/2023  Pt will be independent and compliant with initial strength and balance HEP in order to maintain functional progress and improve mobility. Baseline:  Established on eval. Goal status: INITIAL  2.  Patient will be compliant to formal walking program >/= 4 days per week to improve aerobic tolerance and ambulatory mechanics. Baseline: Established on eval. Goal status: INITIAL  3.  Pt will demonstrate TUG of </=35.91 seconds in order to decrease risk of falls and improve functional mobility using LRAD. Baseline: 45.91 sec Goal status: INITIAL  4.  Pt will demonstrate a gait speed of >/= 0.44 Walker/sec in order to decrease risk for  falls. Baseline: 0.34 Walker/sec Goal status: INITIAL  5.  Pt will independent with desensitization techniques for chronic pain. Baseline: Provided on eval. Goal status: INITIAL  6.  Pt will increase BERG balance score to >/=34/56 to demonstrate improved static balance. Baseline: 29/56 (1/6) Goal status: INITIAL  LONG TERM GOALS: Target date: 01/11/2025  Pt will be independent and compliant with advanced and finalized strength and balance HEP in order to maintain functional progress and improve mobility. Baseline: Established on eval. Goal status: INITIAL  2.  Pt will ambulate >/=50 feet with SPC and no more than SBA level of assist to promote household and community access. Baseline: Cane level prior to fall, 2WW currently Goal status: INITIAL  3.  Pt will demonstrate TUG of </=25.91 seconds in order to decrease risk of falls and improve functional mobility using LRAD. Baseline: 45.91 sec Goal status: INITIAL  4.  Pt will demonstrate a gait speed of >/= 0.54 Walker/sec in order to decrease risk for falls. Baseline: 0.34 Walker/sec Goal status: INITIAL  5.  Patient will be compliant to formal walking program >/= 6 days per week to improve aerobic tolerance and ambulatory mechanics. Baseline: Established on eval. Goal status: INITIAL  6.  Pt will increase BERG balance score to >/=39/56 to demonstrate improved static balance. Baseline: 29/56 (1/6) Goal status: INITIAL  7.  Pt will improve ABC Scale to >/=48.75% in order to demonstrate decreased fear of falling and fall risk. Baseline: 38.75% Goal status: INITIAL  ASSESSMENT:  CLINICAL IMPRESSION: Emphasis of skilled PT session today on reviewing and adding flexibility exercises to home program.  He has noted L hip flexor tightness during standing and supine stretching today.  Progressed to returning to [facility] over level surfaces.  He does well with previously utilized sequencing and no overt LOB.  He may be somewhat limited by his endurance  and would likely benefit from skilled PT interventions to improve this and decrease need for UE support over varying surfaces and distances.  Will continue per POC.  OBJECTIVE IMPAIRMENTS: Abnormal gait, decreased activity tolerance, decreased balance, decreased coordination, decreased endurance, decreased knowledge of use of DME, decreased mobility, difficulty walking, decreased ROM, decreased strength, decreased safety awareness,  increased edema, increased muscle spasms, impaired sensation, impaired tone, impaired UE functional use, improper body mechanics, postural dysfunction, and pain.   ACTIVITY LIMITATIONS: carrying, lifting, bending, standing, squatting, stairs, transfers, bed mobility, reach over head, and locomotion level  PARTICIPATION LIMITATIONS: meal prep, cleaning, driving, shopping, and community activity  PERSONAL FACTORS: Age, Fitness, Past/current experiences, Time since onset of injury/illness/exacerbation, Transportation, and 1-2 comorbidities: extensive cardiac hx, prior CVA w/ L hemiparesis and chronic pain are also affecting patient's functional outcome.   REHAB POTENTIAL: Good  CLINICAL DECISION MAKING: Evolving/moderate complexity  EVALUATION COMPLEXITY: Moderate  PLAN:  PT FREQUENCY: 1x/week  PT DURATION: 8 weeks  PLANNED INTERVENTIONS: 97164- PT Re-evaluation, 97750- Physical Performance Testing, 97110-Therapeutic exercises, 97530- Therapeutic activity, [id number]- Neuromuscular re-education, 97535- Self Care, [id number]- Manual therapy, [phone]- Gait training, [phone]- Aquatic Therapy, [phone]- Electrical stimulation (manual), Patient/Family education, Balance training, Stair training, Taping, Joint mobilization, Vestibular training, DME instructions, Cryotherapy, and Moist heat  PLAN FOR NEXT SESSION: Progress strengthening HEP for BLE, work on gentle core w/o straining - supine bracing w/o holding breath,  standing windmills, standing trunk rotation, deadbug, modified birddog,  wood chops, tall vs half kneel?  Coordination - blaze pods.  Progression to [facility] - did he bring his?  Endurance.  NO weight > 5lbs.   Daved KATHEE Bull, PT, DPT 12/11/2024, 11:47 AM        "

## 2024-12-11 NOTE — Patient Instructions (Signed)
 Access Code: [email or web address] URL: [email or web address] Date: 11/27/2024 Prepared by: Daved Bull  Exercises - Semi-Tandem Balance at [employer] Open  - 1 x daily - 4 x weekly - 1 sets - 2 reps - 45 seconds hold - Feet Together Balance at [employer] Eyes Closed  - 1 x daily - 4 x weekly - 1 sets - 2 reps - 20 seconds hold - Narrow Stance with Counter Support  - 1 x daily - 4 x weekly - 1 sets - 2 reps - 30 seconds hold - Standing March with Counter Support  - 1 x daily - 4 x weekly - 2 sets - 10 reps - Sit to Stand with Armchair  - 1 x daily - 7 x weekly - 1-2 sets - 10 reps - Supine Bridge  - 1 x daily - 4 x weekly - 2 sets - 10 reps - Supine Posterior Pelvic Tilt  - 1 x daily - 4 x weekly - 2 sets - 10 reps - Supine March  - 1 x daily - 4 x weekly - 2 sets - 10 reps - Seated Hamstring Stretch  - 1 x daily - 4 x weekly - 1 sets - 2 reps - 45 seconds hold - Hooklying Single Knee to Chest Stretch with Towel  - 1 x daily - 4 x weekly - 1 sets - 2 reps - 30 sec hold - Supine Butterfly Groin Stretch  - 1 x daily - 4 x weekly - 1 sets - 2 reps - 30-60 sec hold - Standing Hip Flexor Stretch  - 1 x daily - 4 x weekly - 1 sets - 2 reps - 30-45 sec hold - Seated Windmill Trunk Rotation Stretch  - 1 x daily - 4 x weekly - 1 sets - 10 reps

## 2024-12-13 ENCOUNTER — Other Ambulatory Visit (HOSPITAL_BASED_OUTPATIENT_CLINIC_OR_DEPARTMENT_OTHER): Payer: Self-pay | Admitting: Family

## 2024-12-13 ENCOUNTER — Other Ambulatory Visit: Payer: Self-pay | Admitting: Internal Medicine

## 2024-12-13 NOTE — Telephone Encounter (Signed)
 Labs on 11/05/24 are out of Normal Range  In accordance with refill protocols, please review and address the following requirements before this medication refill can be authorized:  Labs

## 2024-12-14 ENCOUNTER — Ambulatory Visit: Payer: Self-pay

## 2024-12-14 ENCOUNTER — Emergency Department (HOSPITAL_COMMUNITY)

## 2024-12-14 ENCOUNTER — Emergency Department (HOSPITAL_COMMUNITY)
Admission: EM | Admit: 2024-12-14 | Discharge: 2024-12-14 | Disposition: A | Discharge status: Home / Self Care | Attending: Emergency Medicine | Admitting: Emergency Medicine

## 2024-12-14 DIAGNOSIS — N189 Chronic kidney disease, unspecified: Secondary | ICD-10-CM | POA: Insufficient documentation

## 2024-12-14 DIAGNOSIS — E1142 Type 2 diabetes mellitus with diabetic polyneuropathy: Secondary | ICD-10-CM | POA: Insufficient documentation

## 2024-12-14 DIAGNOSIS — Z79899 Other long term (current) drug therapy: Secondary | ICD-10-CM | POA: Insufficient documentation

## 2024-12-14 DIAGNOSIS — J40 Bronchitis, not specified as acute or chronic: Secondary | ICD-10-CM | POA: Insufficient documentation

## 2024-12-14 DIAGNOSIS — Z7982 Long term (current) use of aspirin: Secondary | ICD-10-CM | POA: Insufficient documentation

## 2024-12-14 DIAGNOSIS — R0602 Shortness of breath: Secondary | ICD-10-CM | POA: Insufficient documentation

## 2024-12-14 DIAGNOSIS — Z794 Long term (current) use of insulin: Secondary | ICD-10-CM | POA: Insufficient documentation

## 2024-12-14 DIAGNOSIS — I129 Hypertensive chronic kidney disease with stage 1 through stage 4 chronic kidney disease, or unspecified chronic kidney disease: Secondary | ICD-10-CM | POA: Diagnosis not present

## 2024-12-14 DIAGNOSIS — R059 Cough, unspecified: Secondary | ICD-10-CM | POA: Diagnosis present

## 2024-12-14 LAB — COMPREHENSIVE METABOLIC PANEL WITH GFR
ALT: 22 U/L (ref 0–44)
AST: 31 U/L (ref 15–41)
Albumin: 3.8 g/dL (ref 3.5–5.0)
Alkaline Phosphatase: 95 U/L (ref 38–126)
Anion gap: 7 (ref 5–15)
BUN: 20 mg/dL (ref 8–23)
CO2: 26 mmol/L (ref 22–32)
Calcium: 9.2 mg/dL (ref 8.9–10.3)
Chloride: 107 mmol/L (ref 98–111)
Creatinine, Ser: 1.55 mg/dL — ABNORMAL HIGH (ref 0.61–1.24)
GFR, Estimated: 46 mL/min — ABNORMAL LOW
Glucose, Bld: 126 mg/dL — ABNORMAL HIGH (ref 70–99)
Potassium: 4 mmol/L (ref 3.5–5.1)
Sodium: 140 mmol/L (ref 135–145)
Total Bilirubin: 0.8 mg/dL (ref 0.0–1.2)
Total Protein: 7.3 g/dL (ref 6.5–8.1)

## 2024-12-14 LAB — RESP PANEL BY RT-PCR (RSV, FLU A&B, COVID)  RVPGX2
Influenza A by PCR: NEGATIVE
Influenza B by PCR: NEGATIVE
Resp Syncytial Virus by PCR: NEGATIVE
SARS Coronavirus 2 by RT PCR: NEGATIVE

## 2024-12-14 LAB — CBC WITH DIFFERENTIAL/PLATELET
Abs Immature Granulocytes: 0.01 K/uL (ref 0.00–0.07)
Basophils Absolute: 0 K/uL (ref 0.0–0.1)
Basophils Relative: 0 %
Eosinophils Absolute: 0.5 K/uL (ref 0.0–0.5)
Eosinophils Relative: 7 %
HCT: 32.8 % — ABNORMAL LOW (ref 39.0–52.0)
Hemoglobin: 10.9 g/dL — ABNORMAL LOW (ref 13.0–17.0)
Immature Granulocytes: 0 %
Lymphocytes Relative: 41 %
Lymphs Abs: 2.8 K/uL (ref 0.7–4.0)
MCH: 31.9 pg (ref 26.0–34.0)
MCHC: 33.2 g/dL (ref 30.0–36.0)
MCV: 95.9 fL (ref 80.0–100.0)
Monocytes Absolute: 0.8 K/uL (ref 0.1–1.0)
Monocytes Relative: 12 %
Neutro Abs: 2.7 K/uL (ref 1.7–7.7)
Neutrophils Relative %: 40 %
Platelets: 179 K/uL (ref 150–400)
RBC: 3.42 MIL/uL — ABNORMAL LOW (ref 4.22–5.81)
RDW: 12.7 % (ref 11.5–15.5)
WBC: 6.8 K/uL (ref 4.0–10.5)
nRBC: 0 % (ref 0.0–0.2)

## 2024-12-14 LAB — PRO BRAIN NATRIURETIC PEPTIDE: Pro Brain Natriuretic Peptide: 358 pg/mL — ABNORMAL HIGH

## 2024-12-14 MED ORDER — LEVOFLOXACIN 250 MG PO TABS: 250.0000 mg | ORAL_TABLET | Freq: Every day | ORAL | 0 refills | Status: DC

## 2024-12-14 MED ORDER — LACTATED RINGERS IV BOLUS
500.0000 mL | Freq: Once | INTRAVENOUS | Status: AC
  Administered 2024-12-14: 500 mL via INTRAVENOUS

## 2024-12-14 MED ORDER — ATORVASTATIN CALCIUM 40 MG PO TABS: 40.0000 mg | ORAL_TABLET | Freq: Every day | ORAL | 0 refills | Status: AC

## 2024-12-14 MED ORDER — BENZONATATE 100 MG PO CAPS: 100.0000 mg | ORAL_CAPSULE | Freq: Three times a day (TID) | ORAL | 0 refills | Status: AC

## 2024-12-14 NOTE — ED Provider Triage Note (Signed)
 Emergency Medicine Provider Triage Evaluation Note  [name] , a 74 y.o. male  was evaluated in triage.  Pt complains of flulike symptoms.  Patient reports that he has been coughing up greenish phlegm for the past few days.  He was recently prescribed a antibiotic and has finished antibiotic.  He called his doctor to have the antibiotic refilled since he still was not feeling well and the provider said that he needed to come in.  Patient cannot remember the name of this antibiotic.  Patient reports some shortness of breath.  Patient denies any other symptoms.  Review of Systems  Positive: Shortness of breath Negative: Chest pain  Physical Exam  BP (!) 157/93 (BP Location: Right Arm)   Pulse 66   Temp 98 F (36.7 C) (Oral)   Resp (!) 22   SpO2 100%  Gen:   Awake, no distress   Resp:  Normal effort  MSK:   Moves extremities without difficulty  Other:    Medical Decision Making  Medically screening exam initiated at 12:10 PM.  Appropriate orders placed.  [name] was informed that the remainder of the evaluation will be completed by another provider, this initial triage assessment does not replace that evaluation, and the importance of remaining in the ED until their evaluation is complete.     Rosaline Almarie MATSU, [place] 12/14/24 1212

## 2024-12-14 NOTE — ED Triage Notes (Addendum)
 Patient BIBA coming from home dx with bronchitis 1 week ago, finished antibx and now having chills/ /headache/fever/productive cough/increased SOB. BG 168/90 HR 66 99% RA CBG 161. Patient is alert and oriented x 4. Airway patent, respirations even and unlabored. Skin normal, warm and dry.

## 2024-12-14 NOTE — Telephone Encounter (Signed)
 Pt had a lipid panel on 01/18/2024 within 365 days. Pt's medication sent to pt's pharmacy. Confirmation received.

## 2024-12-14 NOTE — Telephone Encounter (Signed)
 FYI Only or Action Required?: FYI only for provider: ED advised.  Patient was last seen in primary care on 11/28/2024 by Geofm Glade PARAS, MD.  Called Nurse Triage reporting Cough.  Symptoms began several weeks ago.  Interventions attempted: Prescription medications: doxycycline - completed.  Symptoms are: rapidly worsening.  Triage Disposition: Go to ED Now (Notify PCP)  Patient/caregiver understands and will follow disposition?: Yes   Reason for Triage: Pt is dizzy, shortness of breath, coughing up greenish yellow mucous, and wake up in sweats. Reason for Disposition  [1] MODERATE difficulty breathing (e.g., speaks in phrases, SOB even at rest, pulse 100-120) AND [2] still present when not coughing  Answer Assessment - Initial Assessment Questions Pt states he was on an atbx for 10 days. His symptoms initially started to get better and then got worse. He states after he finished it he tried to get a refill but it was never refilled. He states today he is still coughing up green and yellow mucus and is having difficulty breathing at rest. While on the phone with 911 and pt patient was moaning to breath with movement. Due to Er/911 dispo not all questions answered.      1. ONSET: When did the cough begin?      Prior to office appt 2. SEVERITY: How bad is the cough today?      bad 3. SPUTUM: Describe the color of your sputum (e.g., none, dry cough; clear, white, yellow, green)     Yellow/green 4. HEMOPTYSIS: Are you coughing up any blood? If Yes, ask: How much? (e.g., flecks, streaks, tablespoons, etc.)      5. DIFFICULTY BREATHING: Are you having difficulty breathing? If Yes, ask: How bad is it? (e.g., mild, moderate, severe)      Yes moderate 6. FEVER: Do you have a fever? If Yes, ask: What is your temperature, how was it measured, and when did it start?      7. CARDIAC HISTORY: Do you have any history of heart disease? (e.g., heart attack, congestive heart  failure)       8. LUNG HISTORY: Do you have any history of lung disease?  (e.g., pulmonary embolus, asthma, emphysema)      9. PE RISK FACTORS: Do you have a history of blood clots? (or: recent major surgery, recent prolonged travel, bedridden)      10. OTHER SYMPTOMS: Do you have any other symptoms? (e.g., runny nose, wheezing, chest pain)  Protocols used: Cough - Acute Productive-A-AH

## 2024-12-14 NOTE — Discharge Instructions (Addendum)
 Take the medications as prescribed to treat your cough and congestion.  Follow-up with your primary care doctor to be rechecked.  Return as needed for worsening symptoms including high fevers, or shortness of breath

## 2024-12-14 NOTE — ED Provider Notes (Signed)
 " [place] EMERGENCY DEPARTMENT AT [facility] Provider Note   CSN: [id number] Arrival date & time: 12/14/24  1054     Patient presents with: Chills and Cough   [name] is a 74 y.o. male.    Cough    Patient has a history of diabetes hyperlipidemia, hypertension, neuropathy, TIA, chronic kidney disease.  Patient reports having cough congestion symptoms ongoing for couple weeks.  Patient states he saw his doctor.  He was given a course of antibiotics.  Patient does not feel like he has gotten any better.  Patient also now started developing headache.  He is having some myalgias.  No recent fever.  No vomiting  Prior to Admission medications  Medication Sig Start Date End Date Taking? Authorizing Provider  benzonatate  (TESSALON ) 100 MG capsule Take 1 capsule (100 mg total) by mouth every 8 (eight) hours. 12/14/24  Yes Randol Simmonds, MD  levofloxacin  (LEVAQUIN ) 250 MG tablet Take 1 tablet (250 mg total) by mouth daily for 7 days. 12/14/24 12/21/24 Yes Randol Simmonds, MD  acetaminophen  (TYLENOL ) 500 MG tablet Take 1 tablet (500 mg total) by mouth every 6 (six) hours as needed for mild pain (pain score 1-3). 11/06/24   Edmundo Marjorie Lapine, PA-C  aspirin  EC 81 MG tablet Take 1 tablet (81 mg total) by mouth daily. Swallow whole. 07/10/20   Dunn, Dayna N, PA-C  atorvastatin  (LIPITOR) 40 MG tablet Take 1 tablet (40 mg total) by mouth daily. 12/14/24   Raford Riggs, MD  Blood Pressure Monitoring (BLOOD PRESSURE CUFF) MISC 1 Units by Does not apply route daily. 02/03/21   Raford Riggs, MD  calcium  carbonate (TUMS - DOSED IN MG ELEMENTAL CALCIUM ) 500 MG chewable tablet Chew 1 tablet by mouth daily as needed for indigestion or heartburn.    [provider]  carvedilol  (COREG ) 25 MG tablet TAKE 1 TABLET TWICE DAILY 08/30/24   Raford Riggs, MD  clopidogrel  (PLAVIX ) 75 MG tablet TAKE 1 TABLET EVERY DAY 12/13/24   Raford Riggs, MD  dextrose  (GLUCO TO GO 15) 40 % GEL  Take 1 Tube by mouth once as needed for low blood sugar.    [provider]  fluticasone  (FLONASE ) 50 MCG/ACT nasal spray Place 2 sprays into both nostrils daily. Patient taking differently: Place 2 sprays into both nostrils daily as needed. 03/09/24   Richad Laydon Martis HERO, NP  glucose blood (ACCU-CHEK AVIVA PLUS) test strip uad tid 12/14/21   Geofm Glade PARAS, MD  insulin  glargine (LANTUS  SOLOSTAR) 100 UNIT/ML Solostar Pen Inject 7-8 Units into the skin daily. Patient taking differently: Inject 7 Units into the skin at bedtime. 07/31/24   Trixie File, MD  insulin  lispro (HUMALOG  KWIKPEN) 100 UNIT/ML KwikPen Inject 6-10 Units into the skin 2 (two) times daily before a meal. 12/09/23   Trixie File, MD  Insulin  Pen Needle 32G X 4 MM MISC Use 4x a day 07/10/24   Trixie File, MD  losartan  (COZAAR ) 100 MG tablet TAKE 1 TABLET EVERY DAY 01/23/24   Raford Riggs, MD  Menthol , Topical Analgesic, 4 % GEL Apply 1 application  topically daily as needed.    [provider]  Microlet Lancets MISC UAD to check sugars TID.  E11.22 12/14/21   Geofm Glade PARAS, MD  nitroGLYCERIN  (NITROSTAT ) 0.4 MG SL tablet Place 1 tablet (0.4 mg total) under the tongue every 5 (five) minutes as needed for chest pain (up to 3 doses). 12/23/22 12/05/24  Raford Riggs, MD  pantoprazole  (PROTONIX ) 40 MG  tablet Take 1 tablet (40 mg total) by mouth daily at 6 (six) AM. 11/07/24   Cherlyn Labella, MD  senna-docusate (SENOKOT-S) 8.6-50 MG per tablet Take 2 tablets by mouth 2 (two) times daily. For constipation. 09/07/12   Maurice Sharlet RAMAN, PA-C  Spacer/Aero-Holding Chambers DEVI 1 each by Does not apply route as needed. 03/09/24   Richad Adrieana Fennelly HERO, NP    Allergies: Penicillins, Pneumococcal vaccines, Shellfish allergy, Barbiturates, Gabapentin , Influenza vaccines, Lisinopril , Other, Sildenafil, Topiramate, Zofran  [ondansetron ], Amlodipine , Metformin , Tadalafil, and Vardenafil    Review of Systems  Respiratory:  Positive  for cough.     Updated Vital Signs BP (!) 168/90 (BP Location: Left Arm)   Pulse 81   Temp 97.7 F (36.5 C) (Oral)   Resp 15   SpO2 100%   Physical Exam Vitals and nursing note reviewed.  Constitutional:      General: He is not in acute distress.    Appearance: He is well-developed.  HENT:     Head: Normocephalic and atraumatic.     Right Ear: External ear normal.     Left Ear: External ear normal.  Eyes:     General: No scleral icterus.       Right eye: No discharge.        Left eye: No discharge.     Conjunctiva/sclera: Conjunctivae normal.  Neck:     Trachea: No tracheal deviation.  Cardiovascular:     Rate and Rhythm: Normal rate and regular rhythm.  Pulmonary:     Effort: Pulmonary effort is normal. No respiratory distress.     Breath sounds: No stridor. Rales present. No wheezing.  Abdominal:     General: Bowel sounds are normal. There is no distension.     Palpations: Abdomen is soft.     Tenderness: There is no abdominal tenderness. There is no guarding or rebound.  Musculoskeletal:        General: No tenderness or deformity.     Cervical back: Neck supple.  Skin:    General: Skin is warm and dry.     Findings: No rash.  Neurological:     General: No focal deficit present.     Mental Status: He is alert.     Cranial Nerves: No cranial nerve deficit, dysarthria or facial asymmetry.     Sensory: No sensory deficit.     Motor: No abnormal muscle tone or seizure activity.     Coordination: Coordination normal.  Psychiatric:        Mood and Affect: Mood normal.     (all labs ordered are listed, but only abnormal results are displayed) Labs Reviewed  PRO BRAIN NATRIURETIC PEPTIDE - Abnormal; Notable for the following components:      Result Value   Pro Brain Natriuretic Peptide 358.0 (*)    All other components within normal limits  COMPREHENSIVE METABOLIC PANEL WITH GFR - Abnormal; Notable for the following components:   Glucose, Bld 126 (*)     Creatinine, Ser 1.55 (*)    GFR, Estimated 46 (*)    All other components within normal limits  CBC WITH DIFFERENTIAL/PLATELET - Abnormal; Notable for the following components:   RBC 3.42 (*)    Hemoglobin 10.9 (*)    HCT 32.8 (*)    All other components within normal limits  RESP PANEL BY RT-PCR (RSV, FLU A&B, COVID)  [id number]    EKG: EKG Interpretation Date/Time:  Friday December 14 2024 12:33:14 EST Ventricular Rate:  64 PR Interval:  165 QRS Duration:  147 QT Interval:  456 QTC Calculation: 471 R Axis:   47  Text Interpretation: Sinus rhythm Left bundle branch block No significant change since last tracing Confirmed by Randol Simmonds [id number]) on 12/14/2024 12:35:53 PM  Radiology: [facility] Chest 2 View Result Date: 12/14/2024 EXAM: 2 VIEW(S) XRAY OF THE CHEST 12/14/2024 12:46:00 PM COMPARISON: 11/02/2024 CLINICAL HISTORY: Shortness of breath. FINDINGS: LUNGS AND PLEURA: No focal pulmonary opacity. No pleural effusion. No pneumothorax. HEART AND MEDIASTINUM: Coronary stent. No acute abnormality of the cardiac and mediastinal silhouettes. BONES AND SOFT TISSUES: No acute osseous abnormality. IMPRESSION: 1. No acute process. Electronically signed by: Dayne Hassell MD 12/14/2024 01:09 PM EST RP Workstation: [id number]     Procedures   Medications Ordered in the ED  lactated ringers  bolus 500 mL (500 mLs Intravenous New Bag/Given 12/14/24 1414)    Clinical Course as of 12/14/24 1608  Fri Dec 14, 2024  1324 DG Chest 2 View Chest x-ray without acute pneumonia [JK]  1515 Pro Brain natriuretic peptide(!) Elevated but within age-adjusted parameters [JK]  1515 CBC with Differential(!) Hemoglobin stable [JK]  1516 Comprehensive metabolic panel(!) Creatinine decreased compared to previous [JK]  1535 Resp panel by RT-PCR (RSV, Flu A&B, Covid) Anterior Nasal Swab negative [JK]    Clinical Course User Index [JK] Randol Simmonds, MD                                 Medical Decision Making Amount  and/or Complexity of Data Reviewed Labs:  Decision-making details documented in ED Course. Radiology:  Decision-making details documented in ED Course.  Risk Prescription drug management.   Pt presents with cough congestion.   No pna on xray.   No signs of chf.  RSV negative.  No oxygen requirement.  Suspect bronchitis.  Could be viral in nature but with his persistent sx will start a course of abx.  Renal dosing of levaquin  considering his other allergies.     Final diagnoses:  Bronchitis    ED Discharge Orders          Ordered    levofloxacin  (LEVAQUIN ) 250 MG tablet  Daily        12/14/24 1554    benzonatate  (TESSALON ) 100 MG capsule  Every 8 hours        12/14/24 1554               Randol Simmonds, MD 12/14/24 1608  "

## 2024-12-17 ENCOUNTER — Ambulatory Visit: Payer: Self-pay

## 2024-12-17 ENCOUNTER — Telehealth: Admitting: Physician Assistant

## 2024-12-17 DIAGNOSIS — Z789 Other specified health status: Secondary | ICD-10-CM | POA: Diagnosis not present

## 2024-12-17 DIAGNOSIS — J208 Acute bronchitis due to other specified organisms: Secondary | ICD-10-CM

## 2024-12-17 DIAGNOSIS — B9689 Other specified bacterial agents as the cause of diseases classified elsewhere: Secondary | ICD-10-CM | POA: Diagnosis not present

## 2024-12-17 MED ORDER — AZITHROMYCIN 250 MG PO TABS: ORAL_TABLET | ORAL | 0 refills | Status: AC

## 2024-12-17 NOTE — Progress Notes (Signed)
 " Virtual Visit Consent   [name], you are scheduled for a virtual visit with a [facility] provider today. Just as with appointments in the office, your consent must be obtained to participate. Your consent will be active for this visit and any virtual visit you may have with one of our providers in the next 365 days. If you have a MyChart account, a copy of this consent can be sent to you electronically.  As this is a virtual visit, video technology does not allow for your provider to perform a traditional examination. This may limit your provider's ability to fully assess your condition. If your provider identifies any concerns that need to be evaluated in person or the need to arrange testing (such as labs, EKG, etc.), we will make arrangements to do so. Although advances in technology are sophisticated, we cannot ensure that it will always work on either your end or our end. If the connection with a video visit is poor, the visit may have to be switched to a telephone visit. With either a video or telephone visit, we are not always able to ensure that we have a secure connection.  By engaging in this virtual visit, you consent to the provision of healthcare and authorize for your insurance to be billed (if applicable) for the services provided during this visit. Depending on your insurance coverage, you may receive a charge related to this service.  I need to obtain your verbal consent now. Are you willing to proceed with your visit today? [name] has provided verbal consent on 12/17/2024 for a virtual visit (video or telephone). [name], [place]  Date: 12/17/2024 3:33 PM   Virtual Visit via Video Note   I, [name], connected with  Other Atienza  ([id number], [birth year]) on 12/17/24 at  3:15 PM EST by a video-enabled telemedicine application and verified that I am speaking with the correct person using two identifiers.  Location: Patient: Virtual  Visit Location Patient: Home Provider: Virtual Visit Location Provider: Home Office   I discussed the limitations of evaluation and management by telemedicine and the availability of in person appointments. The patient expressed understanding and agreed to proceed.    History of Present Illness: [name] is a 74 y.o. who identifies as a male who was assigned male at birth, and is being seen today for medication intolerance and continued URI symptoms.  Patient notes several weeks of URI symptoms including congestion, productive cough, fatigue and intermittent fever. Initially placed on course of Doxycycline  from his PCP which he completed. Notes continued symptoms with some worsening of breathing so was evaluated in the ER on 1/23. Workup at that time included respiratory panel (negative), CBC (chronic anemia but no acute changes), BMP (creatinine at 1.55 down from baseline at 2.0), BNP (358), and CXR (negative of acute process) . Was discharged home on course of Levaquin  and Tessalon . Notes taking the first dose of antibiotic and shortly after noting nausea, dizziness and severe muscle aches, worse in his lower extremities. As such did not take any more of this but took another day or so for these symptoms to resolve. 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 This is somewhat improved today compared to prior couple of days. Notes still with substantial chest congestion and cough that is productive of thick, yellow phlegm. Noting intermittent low-grade fever. Denies SOB. Transportation for in-person evaluation today is limited  due to weather.  Glucose -- 150 this AM.   HPI: HPI  Problems:  Patient Active Problem List   Diagnosis Date Noted   H/O left inguinal hernia repair 11/28/2024   Upper respiratory tract infection 11/28/2024   Lesion of pancreas 11/25/2024   Left thigh pain 11/02/2024   Right-sided face pain 01/18/2024    Conjunctivitis 12/16/2023   Near syncope 12/15/2023   Dehydration 12/15/2023   Plantar fasciitis of left foot 11/09/2023   Pancreatic mass -MRI for follow-up due 06/2024 07/19/2023   Calculus of gallbladder without cholecystitis without obstruction 07/19/2023   Constipation 07/19/2023   Intractable nausea and vomiting 07/14/2023   Epigastric pain 07/14/2023   Transaminitis 07/14/2023   Back pain 03/04/2023   Diarrhea 06/09/2022   Ganglion cyst of wrist, left 12/14/2021   Urinary frequency 11/06/2021   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   Vocal cord paralysis 03/25/2021   Diabetic peripheral neuropathy (HCC) 12/25/2020   Orthopnea 12/16/2020   OSA on CPAP 09/09/2020   Sciatica of left side 07/11/2019   Cough 07/11/2019   Edema leg 10/26/2018   History of stroke 10/26/2018   DOE (dyspnea on exertion) 10/26/2018   Left hip pain 07/14/2018   Acute left ankle pain 07/14/2018   Chest pain 07/14/2018   Nonintractable headache 02/24/2018   CKD stage 3b, GFR 30-44 ml/min (HCC) 02/22/2017   Anemia of chronic disease 02/16/2017   Chronic diastolic heart failure (HCC) 06/29/2016   LBBB (left bundle branch block) 02/02/2016   Nonallopathic lesion of lumbosacral region 12/11/2014   Nonallopathic lesion of sacral region 12/11/2014   Nonallopathic lesion of thoracic region 12/11/2014   Arthritis of left hip 11/20/2014   Ischial bursitis of left side 10/28/2014   Hamstring tightness of left lower extremity 09/16/2014   Piriformis syndrome of left side 09/16/2014   Dysphonia 06/22/2013   Hemiparesis affecting left side as late effect of stroke (HCC) 10/10/2012   Dysphagia following cerebrovascular accident 08/14/2012   Chronic back pain    Peripheral neuropathy (HCC)    TIA (transient ischemic attack) 04/08/2012   Stroke (HCC) 02/21/2012   MORTON'S NEUROMA, 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  Resting comfortably  at home.  Head is normocephalic, atraumatic.  No labored breathing.  Speech is clear and coherent with logical content.  Patient is alert and oriented at baseline.   Assessment and Plan: 1. Acute bacterial bronchitis (Primary) - azithromycin  (ZITHROMAX ) 250 MG tablet; Take 2 tablets on day 1, then 1 tablet daily on days 2 through 5  Dispense: 6 tablet; Refill: 0  2. Medication intolerance  Giving multiple medication intolerances/allergies and impaired renal function, will give script for Azithromycin  for bacterial bronchitis with strict PCP follow-up precautions. Thankfully the reaction/side effect to FQ has resolved -- this has been added to his chart. Supportive measures and OTC medications reviewed. Discussed in-person follow-up with PCP and ER precautions.  Follow Up Instructions: I discussed the assessment and treatment plan with the patient. The patient was provided an opportunity to ask questions and all were answered. The patient agreed with the plan and  demonstrated an understanding of the instructions.  A copy of instructions were sent to the patient via MyChart unless otherwise noted below.   The patient was advised to call back or seek an in-person evaluation if the symptoms worsen or if the condition fails to improve as anticipated.    Mike Velma Lunger, PA-C    [1]  Allergies Allergen Reactions   Penicillins Anaphylaxis   Pneumococcal Vaccines Anaphylaxis   Shellfish Allergy Anaphylaxis   Barbiturates     Gabapentin      Urinary retention   Influenza Vaccines    Lisinopril  Cough   Other Nausea And Vomiting    Pt wife states that dye that is used for eye exam in dr    Sildenafil Other (See Comments) and Nausea And Vomiting    Other reaction(s): Headache, Dizziness   Topiramate Other (See Comments)    Chest spasms and numbness   Zofran  [Ondansetron ]    Amlodipine  Other (See Comments)    LE edema   Metformin  Nausea And Vomiting   Tadalafil Other (See Comments)    dizziness   Vardenafil Other (See Comments)    headaches  [2]  Current Outpatient Medications:    azithromycin  (ZITHROMAX ) 250 MG tablet, Take 2 tablets on day 1, then 1 tablet daily on days 2 through 5, Disp: 6 tablet, Rfl: 0   acetaminophen  (TYLENOL ) 500 MG tablet, Take 1 tablet (500 mg total) by mouth every 6 (six) hours as needed for mild pain (pain score 1-3)., Disp: , Rfl:    aspirin  EC 81 MG tablet, Take 1 tablet (81 mg total) by mouth daily. Swallow whole., Disp: 30 tablet, Rfl: 11   atorvastatin  (LIPITOR) 40 MG tablet, Take 1 tablet (40 mg total) by mouth daily., Disp: 90 tablet, Rfl: 0   benzonatate  (TESSALON ) 100 MG capsule, Take 1 capsule (100 mg total) by mouth every 8 (eight) hours., Disp: 21 capsule, Rfl: 0   Blood Pressure Monitoring (BLOOD PRESSURE CUFF) MISC, 1 Units by Does not apply route daily., Disp: 1 each, Rfl: 0   calcium  carbonate (TUMS - DOSED IN MG ELEMENTAL CALCIUM ) 500 MG chewable tablet, Chew 1 tablet by mouth daily as needed for indigestion or heartburn., Disp: , Rfl:    carvedilol  (COREG ) 25 MG tablet, TAKE 1 TABLET TWICE DAILY, Disp: 180 tablet, Rfl: 1   clopidogrel  (PLAVIX ) 75 MG tablet, TAKE 1 TABLET EVERY DAY, Disp: 90 tablet, Rfl: 0   dextrose  (GLUCO TO GO 15) 40 % GEL, Take 1 Tube by mouth once as needed for low blood sugar., Disp: , Rfl:    fluticasone  (FLONASE ) 50 MCG/ACT nasal spray, Place 2 sprays into both nostrils daily. (Patient taking differently: Place 2 sprays into both nostrils daily  as needed.), Disp: 16 g, Rfl: 0   glucose blood (ACCU-CHEK AVIVA PLUS) test strip, uad tid, Disp: 100 each, Rfl: 12   insulin  glargine (LANTUS  SOLOSTAR) 100 UNIT/ML Solostar Pen, Inject 7-8 Units into the skin daily. (Patient taking differently: Inject 7 Units into the skin at bedtime.), Disp: , Rfl:    insulin  lispro (HUMALOG  KWIKPEN) 100 UNIT/ML KwikPen, Inject 6-10 Units into the skin 2 (two) times daily before a meal., Disp: 30 mL, Rfl: 3   Insulin  Pen Needle 32G X 4 MM MISC, Use 4x a day, Disp: 300 each, Rfl: 3   losartan  (COZAAR ) 100 MG tablet, TAKE 1 TABLET EVERY DAY, Disp: 90 tablet, Rfl: 3   Menthol , Topical Analgesic, 4 % GEL, Apply 1 application  topically daily  as needed., Disp: , Rfl:    Microlet Lancets MISC, UAD to check sugars TID.  E11.22, Disp: 270 each, Rfl: 3   nitroGLYCERIN  (NITROSTAT ) 0.4 MG SL tablet, Place 1 tablet (0.4 mg total) under the tongue every 5 (five) minutes as needed for chest pain (up to 3 doses)., Disp: 25 tablet, Rfl: 3   pantoprazole  (PROTONIX ) 40 MG tablet, Take 1 tablet (40 mg total) by mouth daily at 6 (six) AM., Disp: 30 tablet, Rfl: 0   senna-docusate (SENOKOT-S) 8.6-50 MG per tablet, Take 2 tablets by mouth 2 (two) times daily. For constipation., Disp: , Rfl:    Spacer/Aero-Holding Chambers DEVI, 1 each by Does not apply route as needed., Disp: 1 each, Rfl: 0  "

## 2024-12-17 NOTE — Telephone Encounter (Signed)
 FYI Only or Action Required?: FYI only for provider: virtual UC visit today at 315 PM.  Patient was last seen in primary care on 11/28/2024 by Geofm Glade PARAS, MD.  Called Nurse Triage reporting Medication Reaction.  Symptoms began last week .  Interventions attempted: Prescription medications: levofloxacin  and tessalon  .  Symptoms are: stable.  Triage Disposition: See HCP Within 4 Hours (Or PCP Triage) (overriding Call PCP Now)  Patient/caregiver understands and will follow disposition?: Yes   Reason for Disposition  [1] Caller has URGENT medicine question about med that primary care doctor (or NP/PA) or specialist prescribed AND [2] triager unable to answer question  Answer Assessment - Initial Assessment Questions Patient reports was taking to ER Friday, later on that day sent home two new medication 1 for cough and 1 for infection. When got home , took prescriptions , felt weird, legs weak ankles burning both sides, dizzy and and nausea and numbness on whole body laid down. Next morning had severe case diarrhea. Didn't take the medication anymore. Today has burning on left side waist down same side as stroke 2013, this is daily occurrence since stroke this burning sensation is all the time, but more intense today and over the weekend this is 8/10 and usually is but more burning has rehab appointment tomorrow.  Did have a rash lower body hip dry skin rash still has this and this is itchy. No longer having dizziness, weak legs this all stopped sometime Friday night   Took both levofloxacin   and TESSALON  only Friday night after the ER and has not taken any more doses since. Still coughing up the phlegm was diagnosed to bronchitis. Symptoms for the bronchitis is about the same  since ER. Eating and drinking and baseline  urination. And not having anymore diarrhea since Saturday. Deep breaths induce cough. In the past when had bronchitis was given amoxicillin and body agreed with that. Office  closed today set up virtual care appointment  toady to follow up on ER and discuss medication for treatment. Advised to seek ER worsening symptoms in meantime .  Pharmacy is [facility] for any changes in medication      1. NAME of MEDICINE: What medicine(s) are you calling about?     Tessalon  and levofloxacin   2. QUESTION: What is your question? (e.g., double dose of medicine, side effect)     Concerns for side effects  3. PRESCRIBER: Who prescribed the medicine? Reason: if prescribed by specialist, call should be referred to that group.     ER   Patient denies the following chest pain , difficulty breathing, dizziness  Protocols used: Medication Question Call-A-AH  Message from Rea ORN sent at 12/17/2024  9:11 AM EST  Reason for Triage: Pt believes he had an allergic reaction to either levofloxacin  (LEVAQUIN ) 250 MG tablet or benzonatate  (TESSALON ) 100 MG capsule. He stated after taking both rx's felt like his legs were stiffening, off balance, dizzy, and diarrhea. This happened on Friday and he stopped taking the rx's same day.

## 2024-12-17 NOTE — Patient Instructions (Signed)
 " [name], thank you for joining Elsie Velma Lunger, PA-C for today's virtual visit.  While this provider is not your primary care provider (PCP), if your PCP is located in our provider database this encounter information will be shared with them immediately following your visit.   A [facility] MyChart account gives you access to today's visit and all your visits, tests, and labs performed at [facility]  click here if you don't have a [facility] MyChart account or go to [email or web address]  Consent: (Patient) [name] provided verbal consent for this virtual visit at the beginning of the encounter.  Current Medications:  Current Outpatient Medications:    acetaminophen  (TYLENOL ) 500 MG tablet, Take 1 tablet (500 mg total) by mouth every 6 (six) hours as needed for mild pain (pain score 1-3)., Disp: , Rfl:    aspirin  EC 81 MG tablet, Take 1 tablet (81 mg total) by mouth daily. Swallow whole., Disp: 30 tablet, Rfl: 11   atorvastatin  (LIPITOR) 40 MG tablet, Take 1 tablet (40 mg total) by mouth daily., Disp: 90 tablet, Rfl: 0   benzonatate  (TESSALON ) 100 MG capsule, Take 1 capsule (100 mg total) by mouth every 8 (eight) hours., Disp: 21 capsule, Rfl: 0   Blood Pressure Monitoring (BLOOD PRESSURE CUFF) MISC, 1 Units by Does not apply route daily., Disp: 1 each, Rfl: 0   calcium  carbonate (TUMS - DOSED IN MG ELEMENTAL CALCIUM ) 500 MG chewable tablet, Chew 1 tablet by mouth daily as needed for indigestion or heartburn., Disp: , Rfl:    carvedilol  (COREG ) 25 MG tablet, TAKE 1 TABLET TWICE DAILY, Disp: 180 tablet, Rfl: 1   clopidogrel  (PLAVIX ) 75 MG tablet, TAKE 1 TABLET EVERY DAY, Disp: 90 tablet, Rfl: 0   dextrose  (GLUCO TO GO 15) 40 % GEL, Take 1 Tube by mouth once as needed for low blood sugar., Disp: , Rfl:    fluticasone  (FLONASE ) 50 MCG/ACT nasal spray, Place 2 sprays into both nostrils daily. (Patient taking differently: Place 2 sprays into both nostrils  daily as needed.), Disp: 16 g, Rfl: 0   glucose blood (ACCU-CHEK AVIVA PLUS) test strip, uad tid, Disp: 100 each, Rfl: 12   insulin  glargine (LANTUS  SOLOSTAR) 100 UNIT/ML Solostar Pen, Inject 7-8 Units into the skin daily. (Patient taking differently: Inject 7 Units into the skin at bedtime.), Disp: , Rfl:    insulin  lispro (HUMALOG  KWIKPEN) 100 UNIT/ML KwikPen, Inject 6-10 Units into the skin 2 (two) times daily before a meal., Disp: 30 mL, Rfl: 3   Insulin  Pen Needle 32G X 4 MM MISC, Use 4x a day, Disp: 300 each, Rfl: 3   levofloxacin  (LEVAQUIN ) 250 MG tablet, Take 1 tablet (250 mg total) by mouth daily for 7 days., Disp: 7 tablet, Rfl: 0   losartan  (COZAAR ) 100 MG tablet, TAKE 1 TABLET EVERY DAY, Disp: 90 tablet, Rfl: 3   Menthol , Topical Analgesic, 4 % GEL, Apply 1 application  topically daily as needed., Disp: , Rfl:    Microlet Lancets MISC, UAD to check sugars TID.  E11.22, Disp: 270 each, Rfl: 3   nitroGLYCERIN  (NITROSTAT ) 0.4 MG SL tablet, Place 1 tablet (0.4 mg total) under the tongue every 5 (five) minutes as needed for chest pain (up to 3 doses)., Disp: 25 tablet, Rfl: 3   pantoprazole  (PROTONIX ) 40 MG tablet, Take 1 tablet (40 mg total) by mouth daily at 6 (six) AM., Disp: 30 tablet, Rfl: 0   senna-docusate (SENOKOT-S) 8.6-50 MG per tablet, Take 2  tablets by mouth 2 (two) times daily. For constipation., Disp: , Rfl:    Spacer/Aero-Holding Chambers DEVI, 1 each by Does not apply route as needed., Disp: 1 each, Rfl: 0   Medications ordered in this encounter:  No orders of the defined types were placed in this encounter.    *If you need refills on other medications prior to your next appointment, please contact your pharmacy*  Follow-Up: Call back or seek an in-person evaluation if the symptoms worsen or if the condition fails to improve as anticipated.  [facility] [phone]  Other Instructions Please hydrate and rest. Saline nasal rinse for nasal  congestion. Take the Azithromycin  as directed. Follow-up with Dr. Geofm in person as discussed. If you note any non-resolving, new, or worsening symptoms despite treatment, please seek an in-person evaluation ASAP.    If you have been instructed to have an in-person evaluation today at a local Urgent Care facility, please use the link below. It will take you to a list of all of our available [facility], including address, phone number and hours of operation. Please do not delay care.  [facility]  If you or a family member do not have a primary care provider, use the link below to schedule a visit and establish care. When you choose a [facility] primary care physician or advanced practice provider, you gain a long-term partner in health. Find a Primary Care Provider  Learn more about [facility] in-office and virtual care options: [facility] Get Care Now  "

## 2024-12-18 ENCOUNTER — Other Ambulatory Visit: Payer: Self-pay | Admitting: Internal Medicine

## 2024-12-18 ENCOUNTER — Ambulatory Visit: Admitting: Physical Therapy

## 2024-12-18 MED ORDER — LANTUS SOLOSTAR 100 UNIT/ML ~~LOC~~ SOPN: 7.0000 [IU] | PEN_INJECTOR | Freq: Every day | SUBCUTANEOUS | 1 refills | Status: AC

## 2024-12-18 MED ORDER — INSULIN LISPRO (1 UNIT DIAL) 100 UNIT/ML (KWIKPEN): 6.0000 [IU] | PEN_INJECTOR | Freq: Two times a day (BID) | SUBCUTANEOUS | 3 refills | Status: AC

## 2024-12-18 NOTE — Addendum Note (Signed)
 Addended by: CLEOTILDE ROLIN RAMAN on: 12/18/2024 02:32 PM   Modules accepted: Orders

## 2024-12-19 ENCOUNTER — Ambulatory Visit: Admitting: Podiatry

## 2024-12-25 ENCOUNTER — Ambulatory Visit: Admitting: Physical Therapy

## 2024-12-28 ENCOUNTER — Other Ambulatory Visit: Payer: Self-pay | Admitting: Internal Medicine

## 2025-01-01 ENCOUNTER — Ambulatory Visit: Admitting: Physical Therapy

## 2025-01-03 ENCOUNTER — Other Ambulatory Visit

## 2025-01-04 ENCOUNTER — Ambulatory Visit: Admitting: Podiatry

## 2025-01-08 ENCOUNTER — Ambulatory Visit: Admitting: Physical Therapy

## 2025-01-16 ENCOUNTER — Ambulatory Visit: Admitting: Physical Therapy

## 2025-03-07 ENCOUNTER — Encounter: Admitting: Internal Medicine

## 2025-03-12 ENCOUNTER — Encounter (INDEPENDENT_AMBULATORY_CARE_PROVIDER_SITE_OTHER): Admitting: Ophthalmology

## 2025-04-03 ENCOUNTER — Ambulatory Visit: Admitting: Internal Medicine

## 2025-10-02 ENCOUNTER — Ambulatory Visit
